# Patient Record
Sex: Female | Born: 1935
Health system: Southern US, Community
[De-identification: ages and names within clinical notes are randomized; demographics above are authoritative.]

## PROBLEM LIST (undated history)

## (undated) DIAGNOSIS — D3500 Benign neoplasm of unspecified adrenal gland: Secondary | ICD-10-CM

## (undated) DIAGNOSIS — K861 Other chronic pancreatitis: Secondary | ICD-10-CM

## (undated) DIAGNOSIS — R12 Heartburn: Secondary | ICD-10-CM

## (undated) DIAGNOSIS — I4891 Unspecified atrial fibrillation: Secondary | ICD-10-CM

## (undated) DIAGNOSIS — H3320 Serous retinal detachment, unspecified eye: Secondary | ICD-10-CM

## (undated) DIAGNOSIS — F419 Anxiety disorder, unspecified: Secondary | ICD-10-CM

## (undated) DIAGNOSIS — K922 Gastrointestinal hemorrhage, unspecified: Secondary | ICD-10-CM

## (undated) DIAGNOSIS — I10 Essential (primary) hypertension: Secondary | ICD-10-CM

## (undated) DIAGNOSIS — H548 Legal blindness, as defined in USA: Secondary | ICD-10-CM

## (undated) DIAGNOSIS — E785 Hyperlipidemia, unspecified: Secondary | ICD-10-CM

## (undated) DIAGNOSIS — Z86000 Personal history of in-situ neoplasm of breast: Secondary | ICD-10-CM

## (undated) DIAGNOSIS — K851 Biliary acute pancreatitis without necrosis or infection: Secondary | ICD-10-CM

## (undated) DIAGNOSIS — K5790 Diverticulosis of intestine, part unspecified, without perforation or abscess without bleeding: Secondary | ICD-10-CM

## (undated) DIAGNOSIS — E119 Type 2 diabetes mellitus without complications: Secondary | ICD-10-CM

## (undated) DIAGNOSIS — M8580 Other specified disorders of bone density and structure, unspecified site: Secondary | ICD-10-CM

## (undated) DIAGNOSIS — M1990 Unspecified osteoarthritis, unspecified site: Secondary | ICD-10-CM

## (undated) DIAGNOSIS — K589 Irritable bowel syndrome without diarrhea: Secondary | ICD-10-CM

## (undated) DIAGNOSIS — I2510 Atherosclerotic heart disease of native coronary artery without angina pectoris: Secondary | ICD-10-CM

## (undated) HISTORY — PX: ECTOPIC PREGNANCY SURGERY: SHX613

## (undated) HISTORY — DX: Benign neoplasm of unspecified adrenal gland: D35.00

## (undated) HISTORY — DX: Irritable bowel syndrome, unspecified: K58.9

## (undated) HISTORY — DX: Heartburn: R12

## (undated) HISTORY — DX: Essential (primary) hypertension: I10

## (undated) HISTORY — DX: Anxiety disorder, unspecified: F41.9

## (undated) HISTORY — DX: Unspecified osteoarthritis, unspecified site: M19.90

## (undated) HISTORY — DX: Diverticulosis of intestine, part unspecified, without perforation or abscess without bleeding: K57.90

## (undated) HISTORY — DX: Serous retinal detachment, unspecified eye: H33.20

## (undated) HISTORY — PX: ESOPHAGOGASTRODUODENOSCOPY: SHX1529

## (undated) HISTORY — PX: PANCREATIC PSEUDOCYST DRAINAGE: SHX2158

## (undated) HISTORY — DX: Gastrointestinal hemorrhage, unspecified: K92.2

## (undated) HISTORY — DX: Hyperlipidemia, unspecified: E78.5

## (undated) HISTORY — DX: Biliary acute pancreatitis without necrosis or infection: K85.10

## (undated) HISTORY — DX: Other chronic pancreatitis: K86.1

## (undated) HISTORY — DX: Legal blindness, as defined in USA: H54.8

## (undated) HISTORY — PX: OTHER SURGICAL HISTORY: SHX169

## (undated) HISTORY — DX: Personal history of in-situ neoplasm of breast: Z86.000

## (undated) HISTORY — DX: Other specified disorders of bone density and structure, unspecified site: M85.80

## (undated) HISTORY — PX: LAPAROSCOPIC CHOLECYSTECTOMY: SUR755

## (undated) HISTORY — DX: Atherosclerotic heart disease of native coronary artery without angina pectoris: I25.10

## (undated) HISTORY — DX: Type 2 diabetes mellitus without complications: E11.9

---
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
## 1978-03-17 HISTORY — PX: MASTECTOMY: SHX3

## 1986-03-17 DIAGNOSIS — Z86000 Personal history of in-situ neoplasm of breast: Secondary | ICD-10-CM

## 1986-03-17 HISTORY — DX: Personal history of in-situ neoplasm of breast: Z86.000

## 2000-08-04 ENCOUNTER — Encounter: Payer: Self-pay | Admitting: Family Medicine

## 2000-08-04 ENCOUNTER — Ambulatory Visit (HOSPITAL_COMMUNITY): Admission: RE | Admit: 2000-08-04 | Discharge: 2000-08-04 | Payer: Self-pay | Admitting: Family Medicine

## 2001-08-04 ENCOUNTER — Encounter: Payer: Self-pay | Admitting: Family Medicine

## 2001-08-04 ENCOUNTER — Ambulatory Visit (HOSPITAL_COMMUNITY): Admission: RE | Admit: 2001-08-04 | Discharge: 2001-08-04 | Payer: Self-pay | Admitting: Family Medicine

## 2002-02-01 ENCOUNTER — Encounter: Payer: Self-pay | Admitting: Family Medicine

## 2002-02-01 ENCOUNTER — Ambulatory Visit (HOSPITAL_COMMUNITY): Admission: RE | Admit: 2002-02-01 | Discharge: 2002-02-01 | Payer: Self-pay | Admitting: Family Medicine

## 2002-07-16 DIAGNOSIS — K851 Biliary acute pancreatitis without necrosis or infection: Secondary | ICD-10-CM

## 2002-07-16 HISTORY — PX: OTHER SURGICAL HISTORY: SHX169

## 2002-07-16 HISTORY — DX: Biliary acute pancreatitis without necrosis or infection: K85.10

## 2002-07-31 ENCOUNTER — Encounter: Payer: Self-pay | Admitting: Emergency Medicine

## 2002-07-31 ENCOUNTER — Inpatient Hospital Stay (HOSPITAL_COMMUNITY): Admission: EM | Admit: 2002-07-31 | Discharge: 2002-08-09 | Payer: Self-pay | Admitting: Emergency Medicine

## 2002-08-01 ENCOUNTER — Encounter: Payer: Self-pay | Admitting: Internal Medicine

## 2002-08-05 ENCOUNTER — Encounter: Payer: Self-pay | Admitting: General Surgery

## 2002-08-06 ENCOUNTER — Encounter (INDEPENDENT_AMBULATORY_CARE_PROVIDER_SITE_OTHER): Payer: Self-pay | Admitting: Internal Medicine

## 2002-08-14 ENCOUNTER — Inpatient Hospital Stay (HOSPITAL_COMMUNITY): Admission: EM | Admit: 2002-08-14 | Discharge: 2002-08-16 | Payer: Self-pay | Admitting: Emergency Medicine

## 2002-08-14 ENCOUNTER — Encounter: Payer: Self-pay | Admitting: Internal Medicine

## 2002-08-31 ENCOUNTER — Inpatient Hospital Stay (HOSPITAL_COMMUNITY): Admission: EM | Admit: 2002-08-31 | Discharge: 2002-09-01 | Payer: Self-pay | Admitting: Emergency Medicine

## 2002-08-31 ENCOUNTER — Encounter: Payer: Self-pay | Admitting: Emergency Medicine

## 2002-10-06 ENCOUNTER — Encounter (INDEPENDENT_AMBULATORY_CARE_PROVIDER_SITE_OTHER): Payer: Self-pay | Admitting: Internal Medicine

## 2002-10-06 ENCOUNTER — Ambulatory Visit (HOSPITAL_COMMUNITY): Admission: RE | Admit: 2002-10-06 | Discharge: 2002-10-06 | Payer: Self-pay | Admitting: Internal Medicine

## 2002-11-11 ENCOUNTER — Encounter: Payer: Self-pay | Admitting: Family Medicine

## 2002-11-11 ENCOUNTER — Ambulatory Visit (HOSPITAL_COMMUNITY): Admission: RE | Admit: 2002-11-11 | Discharge: 2002-11-11 | Payer: Self-pay | Admitting: Family Medicine

## 2002-12-08 ENCOUNTER — Encounter (INDEPENDENT_AMBULATORY_CARE_PROVIDER_SITE_OTHER): Payer: Self-pay | Admitting: Internal Medicine

## 2002-12-08 ENCOUNTER — Ambulatory Visit (HOSPITAL_COMMUNITY): Admission: RE | Admit: 2002-12-08 | Discharge: 2002-12-08 | Payer: Self-pay | Admitting: Internal Medicine

## 2003-03-22 ENCOUNTER — Ambulatory Visit (HOSPITAL_COMMUNITY): Admission: RE | Admit: 2003-03-22 | Discharge: 2003-03-22 | Payer: Self-pay | Admitting: Internal Medicine

## 2003-09-19 ENCOUNTER — Ambulatory Visit (HOSPITAL_COMMUNITY): Admission: RE | Admit: 2003-09-19 | Discharge: 2003-09-19 | Payer: Self-pay | Admitting: Internal Medicine

## 2003-11-17 ENCOUNTER — Ambulatory Visit (HOSPITAL_COMMUNITY): Admission: RE | Admit: 2003-11-17 | Discharge: 2003-11-17 | Payer: Self-pay | Admitting: Family Medicine

## 2004-02-12 ENCOUNTER — Ambulatory Visit (HOSPITAL_COMMUNITY): Admission: RE | Admit: 2004-02-12 | Discharge: 2004-02-12 | Payer: Self-pay | Admitting: Internal Medicine

## 2004-04-22 ENCOUNTER — Ambulatory Visit: Payer: Self-pay | Admitting: Internal Medicine

## 2004-08-07 ENCOUNTER — Ambulatory Visit (HOSPITAL_COMMUNITY): Admission: RE | Admit: 2004-08-07 | Discharge: 2004-08-07 | Payer: Self-pay | Admitting: Internal Medicine

## 2004-11-04 ENCOUNTER — Ambulatory Visit: Payer: Self-pay | Admitting: Internal Medicine

## 2004-11-26 ENCOUNTER — Ambulatory Visit (HOSPITAL_COMMUNITY): Admission: RE | Admit: 2004-11-26 | Discharge: 2004-11-26 | Payer: Self-pay | Admitting: General Surgery

## 2004-12-06 ENCOUNTER — Ambulatory Visit (HOSPITAL_COMMUNITY): Admission: RE | Admit: 2004-12-06 | Discharge: 2004-12-06 | Payer: Self-pay | Admitting: Urology

## 2005-02-20 ENCOUNTER — Ambulatory Visit: Payer: Self-pay | Admitting: Internal Medicine

## 2005-07-28 ENCOUNTER — Ambulatory Visit: Payer: Self-pay | Admitting: Internal Medicine

## 2005-08-04 ENCOUNTER — Ambulatory Visit (HOSPITAL_COMMUNITY): Admission: RE | Admit: 2005-08-04 | Discharge: 2005-08-04 | Payer: Self-pay | Admitting: Internal Medicine

## 2005-08-15 ENCOUNTER — Ambulatory Visit: Payer: Self-pay | Admitting: Internal Medicine

## 2005-08-15 ENCOUNTER — Ambulatory Visit (HOSPITAL_COMMUNITY): Admission: RE | Admit: 2005-08-15 | Discharge: 2005-08-15 | Payer: Self-pay | Admitting: Internal Medicine

## 2005-08-15 HISTORY — PX: COLONOSCOPY: SHX174

## 2005-11-28 ENCOUNTER — Ambulatory Visit (HOSPITAL_COMMUNITY): Admission: RE | Admit: 2005-11-28 | Discharge: 2005-11-28 | Payer: Self-pay | Admitting: General Surgery

## 2006-02-18 ENCOUNTER — Ambulatory Visit: Payer: Self-pay | Admitting: Internal Medicine

## 2006-12-01 ENCOUNTER — Ambulatory Visit (HOSPITAL_COMMUNITY): Admission: RE | Admit: 2006-12-01 | Discharge: 2006-12-01 | Payer: Self-pay | Admitting: General Surgery

## 2007-02-02 ENCOUNTER — Ambulatory Visit: Payer: Self-pay | Admitting: Internal Medicine

## 2007-08-13 ENCOUNTER — Ambulatory Visit: Payer: Self-pay | Admitting: Internal Medicine

## 2007-12-02 ENCOUNTER — Ambulatory Visit (HOSPITAL_COMMUNITY): Admission: RE | Admit: 2007-12-02 | Discharge: 2007-12-02 | Payer: Self-pay | Admitting: General Surgery

## 2008-01-26 DIAGNOSIS — C50919 Malignant neoplasm of unspecified site of unspecified female breast: Secondary | ICD-10-CM | POA: Insufficient documentation

## 2008-01-26 DIAGNOSIS — R110 Nausea: Secondary | ICD-10-CM | POA: Insufficient documentation

## 2008-01-26 DIAGNOSIS — K862 Cyst of pancreas: Secondary | ICD-10-CM | POA: Insufficient documentation

## 2008-01-26 DIAGNOSIS — K863 Pseudocyst of pancreas: Secondary | ICD-10-CM

## 2008-01-26 DIAGNOSIS — I10 Essential (primary) hypertension: Secondary | ICD-10-CM | POA: Insufficient documentation

## 2008-01-26 DIAGNOSIS — K922 Gastrointestinal hemorrhage, unspecified: Secondary | ICD-10-CM | POA: Insufficient documentation

## 2008-01-26 DIAGNOSIS — A048 Other specified bacterial intestinal infections: Secondary | ICD-10-CM | POA: Insufficient documentation

## 2008-01-26 DIAGNOSIS — B379 Candidiasis, unspecified: Secondary | ICD-10-CM | POA: Insufficient documentation

## 2008-01-26 DIAGNOSIS — K644 Residual hemorrhoidal skin tags: Secondary | ICD-10-CM | POA: Insufficient documentation

## 2008-01-26 DIAGNOSIS — K869 Disease of pancreas, unspecified: Secondary | ICD-10-CM | POA: Insufficient documentation

## 2008-01-26 DIAGNOSIS — R197 Diarrhea, unspecified: Secondary | ICD-10-CM | POA: Insufficient documentation

## 2008-01-26 DIAGNOSIS — R1110 Vomiting, unspecified: Secondary | ICD-10-CM | POA: Insufficient documentation

## 2008-01-26 DIAGNOSIS — K219 Gastro-esophageal reflux disease without esophagitis: Secondary | ICD-10-CM | POA: Insufficient documentation

## 2008-01-26 DIAGNOSIS — K5900 Constipation, unspecified: Secondary | ICD-10-CM

## 2008-01-26 DIAGNOSIS — N390 Urinary tract infection, site not specified: Secondary | ICD-10-CM | POA: Insufficient documentation

## 2008-09-12 ENCOUNTER — Ambulatory Visit: Payer: Self-pay | Admitting: Internal Medicine

## 2008-09-12 DIAGNOSIS — K861 Other chronic pancreatitis: Secondary | ICD-10-CM | POA: Insufficient documentation

## 2008-12-04 ENCOUNTER — Ambulatory Visit (HOSPITAL_COMMUNITY): Admission: RE | Admit: 2008-12-04 | Discharge: 2008-12-04 | Payer: Self-pay | Admitting: General Surgery

## 2009-09-07 ENCOUNTER — Encounter (INDEPENDENT_AMBULATORY_CARE_PROVIDER_SITE_OTHER): Payer: Self-pay | Admitting: *Deleted

## 2009-12-13 ENCOUNTER — Ambulatory Visit (HOSPITAL_COMMUNITY): Admission: RE | Admit: 2009-12-13 | Discharge: 2009-12-13 | Payer: Self-pay | Admitting: Family Medicine

## 2010-04-07 ENCOUNTER — Encounter: Payer: Self-pay | Admitting: Family Medicine

## 2010-04-18 NOTE — Letter (Signed)
Summary: Recall Office Visit  [facility]  [address]   [place], [place] [address]   Phone: [phone]  Fax: [phone]      September 07, 2009   Misty Edwards [address] [place], [place]  [address] 09-Nov-1926   Dear Ms. Warnell,   According to our records, it is time for you to schedule a follow-up office visit with [place].   At your convenience, please call [phone] to schedule an office visit. If you have any questions, concerns, or feel that this letter is in error, we would appreciate your call.   Sincerely,    Diana Eves  [facility] Ph: [phone]   Fax: [phone]

## 2010-07-30 NOTE — Letter (Signed)
Aug 13, 2007     Followup of chronic pancreatitis.  History of colonic polyps.  Positive  family history of colon cancer.  Last seen on February 02, 2007.  She  was doing very well on Creon and Nexium; that continues to be the case.  She has actually gained 10 pounds since her last visit.  She is due for  a high-risk screening surveillance colonoscopy in June 2012.  Overall,  she is feeling very well.  She is active out in the yard these days with  the warmer weather and very happy.  She is having no abdominal pain,  whatsoever.  She is tolerating Creon and Nexium very well.   CURRENT MEDICATIONS:  See updated list.   ALLERGIES:  VITAMIN D and ASA.   PHYSICAL EXAMINATION:  GENERAL:  Today, looks well.  VITAL SIGNS:  Weight 151, height 5 feet 3 inches, temperature 98.2, BP  124/70, pulse 80.  SKIN:  Warm and dry.  HEENT EXAM:  No scleral icterus.  NECK:  JVD is not prominent.  CHEST:  Lungs are clear to auscultation.  ABDOMEN:  Flat, positive bowel sounds, soft, entirely nontender to  palpation.  No appreciable masses or organomegaly.   ASSESSMENT:  History of chronic pancreatitis secondary to gallstones.  Clinically doing very well on acid suppression and concomitant  pancreatic enzyme therapy.   RECOMMENDATIONS:  Continue this regimen indefinitely.  Plan to see her  back in 1 year.  We will slate her for a followup colonoscopy in June  2012.      Jonathon Bellows, M.D.  Electronically Signed     RMR/MEDQ  D:  08/13/2007  T:  08/14/2007  Job:  [id number]   cc:   Mila Homer. Sudie Bailey, M.D.

## 2010-07-30 NOTE — Assessment & Plan Note (Signed)
Misty Edwards, Misty Edwards                CHART#:  [id number]   DATE:  02/02/2007                       DOB:  Sep 14, 1926   HISTORY OF PRESENT ILLNESS:  Originally this was Dr. Inge Rise patient.  She switched over to me in the wake of Dr. Inge Rise departure.  She was  scheduled for January 04, 2007, but had to reschedule.  This lady was  last seen in December of last year, when she had a protracted diarrheal  illness following acute onset of diarrhea back around Thanksgiving.  Her  stool studies came back negative.  It was felt she had infectious  diarrhea.  Her diarrhea has since resolved.  In fact, she has done  extremely well until November 1, when she had some upper abdominal pain  and some diarrhea and some reflux symptoms together, where she took some  Tagamet.  Otherwise, she has not had any abdominal pain, no diarrhea, no  melena or hematochezia, no odynophagia, no dysphagia, no early satiety.  She has a history of chronic calcific pancreatitis, felt to be secondary  to gallstones.  She had a couple of small pseudocysts on her last CT,  one year ago.  Dr. Dionicia Abler felt no further evaluation was warranted.  She  has been maintained on Creon and acid-suppression therapy with Nexium  since that time.  She rarely takes a Phenergan for occasional nausea.  I  note that she has lost 5.5 pounds since her office visit, February 18, 2006.  Overall, clinically she feels that she is doing well.  She had no  significant findings on colonoscopy, June 2007.  She has a positive  family history of colon cancer in her brother.  Overall, she has not had  any significant intercurrent illnesses since being seen by Dr. Dionicia Abler on  February 18, 2006.   CURRENT MEDICATIONS:  See updated list.   ALLERGIES:  No known drug allergies.   EXAM TODAY:  She looks well.  Weight 141, height 5 feet 3, temperature 98.3, BP 144/80, pulse 80.  SKIN:  Warm and dry.  There is no jaundice.  CHEST:  Lungs are clear to  auscultation.  CARDIAC EXAM:  Regular rate and rhythm without murmur, gallop or rub.  ABDOMEN:  Nondistended, positive bowel sounds, soft, entirely nontender  without appreciable mass or organomegaly.  EXTREMITY EXAM:  No edema.   ASSESSMENT:  Ms. Dascenzo has a history of chronic pancreatitis on the  basis of gallstones.  She has had a tough course with it in the past and  two residual small pseudocysts on her last CT in 2007.  She has done  extremely well, except for a self-limited episode of nonspecific  symptoms the first of this month.  She is tolerating pancreatic enzyme  therapy with concomitant acid-suppression therapy very well.  At this  point, I do not see the need to do any further evaluation on this nice  lady at this time, assuming she continues to do well.  Should she have  any recurrent abdominal pain, would consider repeat CT scanning, but I  do not feel that is necessary at this time.  I have recommended that she  continue her Creon and acid-suppression therapy.  We will plan to see  this nice lady back in the office in six months to see how  she is doing.   However, if she were to develop any interim symptoms, I have urged her  to let me know at once.       Jonathon Bellows, M.D.  Electronically Signed     RMR/MEDQ  D:  02/02/2007  T:  02/03/2007  Job:  [id number]   cc:   Mila Homer. Sudie Bailey, M.D.

## 2010-08-02 NOTE — Discharge Summary (Signed)
NAME:  Misty Edwards, Misty Edwards NO.:  [id number]   MEDICAL RECORD NO.:  [id number]                   PATIENT TYPE:  INP   LOCATION:  A218                                 FACILITY:  [facility]   PHYSICIAN:  Dalia Heading, M.D.               DATE OF BIRTH:  06-Dec-1926   DATE OF ADMISSION:  07/31/2002  DATE OF DISCHARGE:  08/09/2002                                 DISCHARGE SUMMARY   </   HOSPITAL COURSE SUMMARY:  The patient is a 77 year old white female who  presented to the emergency room with abdominal pain.  She was also noted to  have elevated liver enzyme tests as well as amylase and lipase.  Ultrasound  of the gallbladder was performed which revealed acute cholecystitis, dilated  comon bile duct and gallstone pancreatitis.  A surgery consultation was  obtained.   She was taken tot he operating room once her amylase and lipase had returned  to normal.  She underwent laparoscopic cholecystectomy with cholangiograms  on 08/05/2002.  Cholangiograms did reveal two common bile duct stones.  These  could not be flushed through the system.  Dr. Karilyn Cota, of gastroenterology,  was hurried in consultation and subsequently took the patient to the  fluoroscopy suite the next day, and the patient underwent an ERCP with  sphincterotomy for stone extraction.   Her postoperative course has been for the most part unremarkable.  Her liver  enzyme tests have been returning to normal.  Her amylase and lipase were  within normal limits postoperatively.  Preoperatively, she did have some  sinus tachycardia which was treated with Tenormin.   The patient is being discharged home on May 25 in [place] in stable condition.   DISCHARGE INSTRUCTIONS:  The patient is to follow up with Dr. Franky Macho  on 08/11/2002, Dr. Sudie Bailey in one week.   DISCHARGE MEDICATIONS:  1. Tenormin 50 mg p.o. b.i.d.  2. Darvocet-N 100 one to two tablets p.o. q.4h. p.r.n. pain.  3. Phenergan 25 mg p.o.  q.6h. p.r.n. nausea.  4. Glucotrol XL 500 mg p.o. b.i.d.  5. Avandia 4 mg p.o. daily.  6. Vasotec 10 mg p.o. b.i.d.  7. Tolbutamide dialy.   PRINCIPAL DIAGNOSES:  1. Gallstone pancreatitis.  2. Acute cholecystitis, cholelithiasis, choledocholithiasis.  3. Non-insulin-dependent diabetes mellitus.  4. Hypertension.  5. History of breast cancer.   PRINCIPAL PROCEDURES:  1. Laparoscopic cholecystectomy with cholangiograms by Dr. Franky Macho on     08/05/2002.  2. ERCP with sphincterotomy and stone extraction by Dr. Karilyn Cota on 08/06/2002.                                               Dalia Heading, M.D.    MAJ/MEDQ  D:  08/09/2002  T:  08/09/2002  Job:  [id number]   cc:   Mila Homer. Sudie Bailey, M.D.  [address] [place], [place] [address]  Fax: [phone]   Lionel December, M.D.  [address]  [place]  [place] [address]  Fax: [phone]

## 2010-08-02 NOTE — Group Therapy Note (Signed)
   NAME:  Misty Edwards, Misty Edwards NO.:  [id number]   MEDICAL RECORD NO.:  [id number]                   PATIENT TYPE:  INP   LOCATION:  IC09                                 FACILITY:  [facility]   PHYSICIAN:  Mila Homer. Sudie Bailey, M.D.           DATE OF BIRTH:  09/27/1926   DATE OF PROCEDURE:  DATE OF DISCHARGE:                                   PROGRESS NOTE   SUBJECTIVE:  The patient is recovering now in ICU after having had  laparoscopic cholecystectomy today.   OBJECTIVE:  Vital signs today show temperature 97.5, pulse 109, respiratory  rate 18, blood pressure 170/76.  She appears to be a little bit drowsy but  oriented and alert today.  She is well-developed, well-nourished.  In no  acute distress this evening.  Lungs clear throughout, moving air well.  No  intercostal retraction.  No use of accessory muscles of respiration.  The  heart has a regular rhythm, rate about 100.  No murmurs.  There is no edema  of the ankles.  Abdomen is essentially soft, with puncture marks from  laparoscopic cholecystectomy.   Glucose noted to be in the 200 range today.   Intraoperative cholangiogram showed what appeared to be multiple filling  defects of the distal common bile duct.  Apparently, one _______ was left  here.   ASSESSMENT:  1. Pancreatitis secondary to common bile duct stone.  2. Cholecystitis.  3. Obstruction of the common bile duct by stone.   PLAN:  ERCP through Dr. Karilyn Cota tomorrow.  Continue IV antibiotics and  fluids.                                               Mila Homer. Sudie Bailey, M.D.    SDK/MEDQ  D:  08/05/2002  T:  08/06/2002  Job:  [id number]

## 2010-08-02 NOTE — Op Note (Signed)
NAME:  Misty Edwards, Misty Edwards NO.:  [id number]   MEDICAL RECORD NO.:  [id number]                   PATIENT TYPE:  INP   LOCATION:  IC09                                 FACILITY:  [facility]   PHYSICIAN:  Dalia Heading, M.D.               DATE OF BIRTH:  [birth year]   DATE OF PROCEDURE:  08/05/2002  DATE OF DISCHARGE:                                 OPERATIVE REPORT   PREOPERATIVE DIAGNOSES:  1. Gallstone pancreatitis.  2. Cholecystitis.  3. Cholelithiasis.   POSTOPERATIVE DIAGNOSES:  1. Gallstone pancreatitis.  2. Cholecystitis.  3. Cholelithiasis.  4. Choledocholithiasis.   PROCEDURE:  Laparoscopic cholecystectomy with cholangiogram.   SURGEON:  Dalia Heading, M.D.   ASSISTANT:  Bernerd Limbo. Leona Carry, M.D.   ANESTHESIA:  General endotracheal.   INDICATIONS:  The patient is a 77 year old, white female, who presented with  gallstone pancreatitis and cholelithiasis.  She was also noted to have a  slightly dilated common bile duct.  The patient now comes to the operating  room for laparoscopic cholecystectomy with cholangiograms.  The risks and  benefits of the procedure, including bleeding, infection, hepatobiliary  injury, and the possibility of an open procedure were fully explained to the  patient, who gave informed consent.   PROCEDURE NOTE:  The patient was placed in the supine position.  After  induction of general endotracheal anesthesia, the abdomen was prepped and  draped using the usual sterile technique with Betadine.  Surgical site  confirmation was performed.   An supraumbilical incision was made down to the fascia.  Veress needle was  introduced into the abdominal cavity, and confirmation of placement was done  using the saline drop test.  The abdomen was then insufflated to 16 mmHg  pressure.  An 11 mm trocar was introduced into the abdominal cavity under  direct visualization without difficulty.  The patient was placed in reverse  Trendelenburg position.  In addition, an 11 mm trocar was placed in the  epigastric region, and 5 mm trocars were placed in the right upper quadrant  and right flank regions.  The liver was inspected and noted to be within  normal limits.  The gallbladder was retracted superiorly and laterally.  The  dissection was begun around the infundibulum of the gallbladder.  The cystic  duct was first identified.  Its junction to the infundibulum fully  identified.  A single Endo Clip was placed proximally on the cystic duct.  An incision was made in the cystic duct, and the cholangiocatheter was  inserted.  Under fluoroscopic guidance, cholangiograms were performed.  Filling defects were note in the distal common bile duct.  Despite multiple  attempts at flushing the common duct, these could not be flushed through.  The dye did not flow into the duodenum.  The patient also had thick,  granular sludge present under pressure when the cystic duct was opened.  It  was thought the patient would be best treated with a postoperative ERCP and  sphincterotomy.  The cholangiocatheter was removed, and multiple Endo Clips  were placed distally on the cystic duct, and the cystic duct was divided.  The cystic artery was likewise ligated and divided.  The gallbladder was  freed away from the gallbladder fossa using Bovie electrocautery.  The  gallbladder was delivered through the epigastric trocar site using the  EndoCatch bag.  The gallbladder fossa was inspected, and no abnormal  bleeding or bile leakage was noted.  Surgicel was placed in the gallbladder  fossa.  All fluid and air were then evacuated from the abdominal cavity  prior to removal of the trocars.   All wounds were irrigated with normal saline.  All wounds were injected with  0.5 Sensorcaine.  The supraumbilical fascia as well as epigastric fascia  were reapproximated using an 0 Vicryl interrupted suture.  All skin  incisions were closed using  staples.  Betadine, ointment, and dry sterile  dressings were applied.   All tape and needle counts correct at the end of the procedure.  The patient  was extubated in the operating room and went to the recovery room in guarded  but stable condition.  She did have some respiratory depression  postoperatively, and the patient was going to be transferred to the  intensive care unit for further monitoring.  Complications:  None. Specimen:  Gallbladder with stones.  Blood loss:  Less than 100 mL.                                               Dalia Heading, M.D.    MAJ/MEDQ  D:  08/05/2002  T:  08/05/2002  Job:  [id number]   cc:   Mila Homer. Sudie Bailey, M.D.  [address] [place], [place] [address]  Fax: [phone]   Lionel December, M.D.  [address]  [place]  [place] [address]  Fax: [phone]

## 2010-08-02 NOTE — Consult Note (Signed)
NAME:  Misty Edwards, Misty Edwards NO.:  [id number]   MEDICAL RECORD NO.:  [id number]                   PATIENT TYPE:  INP   LOCATION:  A209                                 FACILITY:  [facility]   PHYSICIAN:  Lionel December, M.D.                 DATE OF BIRTH:  Jun 28, 1926   DATE OF CONSULTATION:  08/15/2002  DATE OF DISCHARGE:                                   CONSULTATION   REFERRING PHYSICIAN:  Gracelyn Nurse, M.D.   REASON FOR CONSULTATION:  GI bleed and pancreatic phlegmon.   HISTORY OF PRESENT ILLNESS:  The patient is a 77 year old, Caucasian female  who was admitted under Dr. Stasia Cavalier service yesterday via emergency room  where she presented with elevated blood glucose levels, profound weakness  and inability to eat.  On admission, she was noted to have acidosis possibly  to combination of DKA and Glucophage and this has improved with therapy.  The patient was admitted to this facility on Jul 31, 2002, for biliary  pancreatitis.  She recovered rather slowly.  She had elevated bilirubin and  transaminases, but these gradually came down.  She had laparoscopic  cholecystectomy on Aug 05, 2002.  She did have intraoperative cholangiogram  and she was noted to have small stones in bile duct which could not be  flushed out.  Therefore, ERCP on Aug 06, 2002, was performed.  Pancreatic  duct was not filled with contrast.  She had sphincterotomy with removal of  two small stones and a lot of black blood.  Post procedure, she did fine.  She did have not have any bump in her amylase or lipase.  Per discharge, her  amylase and lipase were normal and WBC was also normal.  She really did not  have any pain other than just some soreness which was residual from her  pancreatitis.  She noted her sugars were high.  One day her blood glucose  level was over 400 and she could not eat and felt very weak.  She was asked  to increase one of her oral hypoglycemics.  She started to  have some pain  about two to three days ago.  Yesterday, she had diarrhea with dark stool,  but she did not realize the significance.  In the hospital, she was noted to  have tarry heme positive stool.  She has not experienced any nausea,  vomiting or hematemesis.  She also has not experienced dyspnea, chest pain,  fever or chills.  She has lost some weight, but she is not sure how much.   MEDICATIONS PRIOR TO ADMISSION:  1. Glucophage XR 1 g b.i.d.  2. Avandia 4 mg q.d.  3. Vasotec 10 mg q.d.  4. Atenolol 50 mg q.d.  5. Darvocet-N 100 q.4h. p.r.n.  6. Phenergan 25 mg q.4h. p.r.n.   MEDICATIONS ON ADMISSION:  1. Humulin R via sliding scale.  2.  NPH insulin 12 units q.12h.  3. Zosyn 3.375 g IV q.6h.  4. Protonix 40 mg IV q.24h.  5. Morphine sulfate 1-2 mg IV q.4-6h. p.r.n.   LABORATORY DATA AND X-RAY FINDINGS:  WBC 11.5, H&H 11.7 and 34.7, platelet  count 411,000; 89 neutrophils, 0 bands.  Sodium 134, potassium 4.6, chloride  105, CO2 20, glucose 365, BUN 14, creatinine 1.4, calcium 9.1.  Her total  bilirubin is 2 with direct of 0.2, Alk phos 277, AST 27, ALT 55, total  protein 6.3 with albumin of 2.5.  Her transaminases have come down since  discharge and AP has gone up slightly.  Her PT from this afternoon was 14.6  with INR of 1.1.  Her last H&H dropped to 9.5 and 27.9.   She had abdominopelvic CT yesterday which showed 7 x 14 cm phlegmon anterior  perirenal space diffusely encasing pancreatic tissue.  Pancreas is  attenuated by enhancing in the alternate process to tail in the central  part.  There was some thickening to sigmoid colon.   PAST MEDICAL HISTORY:  1. Diabetic since 1988.  This was initially picked up in 1975, but then     glucose levels remained normal for awhile.  2. Hypertension.  3. Right mastectomy about 13 years ago for breast CA and has remained in     remission.  4. She had benign lesion removed from left breast.  5. She has had tacking up of  bladder.  6. Surgery for tubal pregnancy.  7. Colonoscopy about three years ago with removal of small polyps and     appears that she has been treated for H. pylori infection.  8. Recent laparoscopic cholecystectomy and ERCP as above.   ALLERGIES:  No known drug allergies.   FAMILY HISTORY:  Significant for colon cancer in her father who died of it.  Brother has had surgery for colon cancer.  He is presently at [facility] in [place] for complicated biliary pancreatitis.  He  was at this hospital and transferred.  She has six siblings living and four  our diabetic.  Her mother died of ovarian cancer.  She had another brother  who just had laparoscopic cholecystectomy at [facility] last week.   SOCIAL HISTORY:  She is married and has five children who are in good  health.  She is retired from [employer].  She has never smoked cigarettes  and does not drink alcohol.   PHYSICAL EXAMINATION:  GENERAL:  Well-developed, well-nourished, Caucasian  female who does not appear to be in any distress.  VITAL SIGNS:  Admission weight 154.3 pounds, pulse 67 per minute, blood  pressure 153/78, respirations 20, temperature 97.4.  HEENT:  Conjunctivae somewhat pale, sclerae nonicteric.  Oropharyngeal  mucosa is normal.  She has few teeth in lower jaw in poor condition.  She  has edentulous left upper jaw.  NECK:  Without masses or thyromegaly.  CARDIAC:  Regular rate with normal S1, S2.  No murmur or gallop noted.  LUNGS:  She has few rales at left base.  ABDOMEN:  Symmetrical.  Laparoscopy scars are well-healed.  Bowel sounds  normal.  She has mild tenderness in mid epigastrium.  No organomegaly or  masses.  RECTAL:  Deferred.  Stool earlier today has been heme positive.  EXTREMITIES:  She has trace pitting edema around her ankles.   I have reviewed her CT and findings are as above.  ASSESSMENT:  The patient is a 77 year old,  Caucasian female who was  treated  for biliary pancreatitis recently.  She had laparoscopic cholecystectomy on  May 21, followed by endoscopic retrograde cholangiopancreatography with  sphincterotomy on May 22, and she went home on May 25.  Predischarge  amylase, lipase and white blood count were normal.  She had fairly  protracted course of her pancreatitis.  Now she presents with mild acidosis  secondary to her Glucophage diabetic ketoacidosis which has improved with  intravenous fluids and insulin with stopping of Glucophage.  She also has  developed phlegmon in the pancreas or more appropriate necrotic pancreas.  She does not appear to be toxic.  Central portion of the pancreas enhances  which means it is viable.  Although she does not have any obvious  pseudocyst, she is at great risk to develop, and I am almost certain that  she will develop pseudocyst, given the degree of necrosis.  In this setting,  it is very appropriate to treat her with intravenous Zosyn.   She has melena.  I suspect she has developed stress-induced peptic ulcer  disease.  Since her hemoglobin and hematocrit has dropped significantly,  esophagogastroduodenoscopy would be appropriate in this setting.   RECOMMENDATIONS:  1. EGD in a.m.  2. Zosyn should be continued for now.  3. Will add pancreatic enzyme supplements in the a.m.  4. She will need repeat CT in the next four to six days to determine whether     or not she has developed pseudocyst.   I would like to thank you for the opportunity to participate in the care of  this nice lady.                                               Lionel December, M.D.    NR/MEDQ  D:  08/15/2002  T:  08/15/2002  Job:  [id number]

## 2010-08-02 NOTE — Op Note (Signed)
Misty Edwards, Misty Edwards               ACCOUNT NO.:  [id number]   MEDICAL RECORD NO.:  [id number]          PATIENT TYPE:  AMB   LOCATION:  DAY                           FACILITY:  [facility]   PHYSICIAN:  Lionel December, M.D.    DATE OF BIRTH:  [birth year]   DATE OF PROCEDURE:  08/15/2005  DATE OF DISCHARGE:                                 OPERATIVE REPORT   PROCEDURE:  Colonoscopy.   INDICATION:  Misty Edwards is a 77 year old Caucasian female who is undergoing  high-risk screening colonoscopy.  Family history is positive for colon  carcinoma in a brother.  Her last exam was over 6 years ago.  Procedure and  risks were reviewed with the patient and 40 mg IV Versed 8 mg IV.   FINDINGS:  Procedure performed in endoscopy suite.  The patient's vital  signs and O2 sat were monitored during the procedure and remained stable.  The patient was placed left lateral position.  Rectal examination performed.  No abnormality noted external or digital exam.  Olympus videoscope was  placed rectum and advanced under vision into sigmoid.  The patient was  turned into supine position and scope was passed into descending colon and  once this was reduced.  Scope was advanced cecum which was identified by  appendiceal orifice and ileocecal valve.  Pictures taken for the record.  Preparation was satisfactory.  Mucosa was examined for the second time.  They are few tiny diverticula at sigmoid colon.  Rectal mucosa was normal.  Scope was retroflexed to examine anorectal junction and small hemorrhoids  noted below the dentate line.  Endoscope was straightened and withdrawn.  The patient tolerated the procedure well.   FINAL DIAGNOSIS:  Few tiny diverticula at sigmoid colon, external  hemorrhoids but no evidence of the polyps or other abnormalities.   RECOMMENDATIONS:  She will resume her usual medicines and diet.  Yearly  Hemoccults.  Next, she may consider next screening exam in 5 years from now.      Lionel December,  M.D.  Electronically Signed     NR/MEDQ  D:  08/15/2005  T:  08/15/2005  Job:  [id number]   cc:   Mila Homer. Sudie Bailey, M.D.  Fax: [phone]

## 2010-08-02 NOTE — Discharge Summary (Signed)
NAME:  Misty Edwards, Misty Edwards NO.:  [id number]   MEDICAL RECORD NO.:  [id number]                   PATIENT TYPE:  INP   LOCATION:  A209                                 FACILITY:  [facility]   PHYSICIAN:  Hanley Hays. Dechurch, M.D.           DATE OF BIRTH:  15-Nov-1926   DATE OF ADMISSION:  08/14/2002  DATE OF DISCHARGE:  08/16/2002                                 DISCHARGE SUMMARY   DIAGNOSES:  1. Pancreatic phlegmon.  2. Status post cholecystectomy secondary to gallstone pancreatitis Aug 05, 2002.  3. Diabetes mellitus.  4. Metabolic acidosis nonanion gap resolved.  5. History of breast cancer status post right mastectomy 13 years prior.  6. History of hypertension.  7. History of bladder suspension surgery.   DISPOSITION:  Patient is being transferred to [facility] at the family's request.   HOSPITAL COURSE:  Patient is a 77 year old Caucasian female who underwent  laparoscopic cholecystectomy Aug 05, 2002 secondary to gallstone  pancreatitis which was diagnosed Jul 31, 2002.  At the time of discharge,  her transaminase and pancreatic enzymes, bilirubin all were normal.  White  count had improved.  On Aug 06, 2002 she underwent ERCP as an intraoperative  cholangiogram revealed several small stones.  The pancreatic duct was not  filled with contrast.  She had a sphincterotomy with removal of two small  stones and a lot of old blood according to the notes.  She did well post  procedure.  She had no evidence of changes in her enzymes.  Her p.o. intake  at the time of discharge was poor but she was tolerating the diet.  She had  no nausea.  She had no pain.  She was discharged to home where she failed to  thrive.  She was noted to have hyperglycemia and was seen by her primary  care physician apparently her medicines were increased but due to lack of  improvement she presented to the emergency room, eventually admitted  to the  hospital for uncontrolled diabetes, severe metabolic acidosis and further  evaluation.  At the time of admission she also developed some tarry stools  which was grossly heme positive.  She remained hemodynamically stable.  She  underwent endoscopy which revealed some gastritis but no frank lesions.  Her  metabolic acidosis corrected with fluids and the discontinuation of  Glucophage.  CT of the abdomen was done because of her pain and presentation  which revealed prominent peripancreatic phlegmon stenting from the uncinate  process to the pancreatic tail measuring about 14 x 7.  There was no frank  abscess or frank cyst formation but there was an area suggesting developing  organization of a pseudocyst.  There was no vessel thrombosis and nothing to  suggest necrotizing pancreatitis.   DISPOSITION:  The findings were discussed at length with the family.  The  patient  actually was feeling better on the second hospital day.  The plan  was to begin TPN and re-CT scan the patient later in the week and if  necessary transfer to [facility] for pseudocyst  drainage if necessary however, the family insisted upon transfer at this  time.  Dr. Dionicia Abler was kind enough to assist in arranging this through the GI  department.  Dr. Cala Bradford graciously accepted the patient in transfer and  she is awaiting placement at that facility.   DISCHARGE PHYSICAL EXAMINATION:  GENERAL:  At the time of discharge the  patient is alert and appropriate in no distress.  VITAL SIGNS: Temperature is 97.6, blood pressure is 152/62.  Glucose is  ranging anywhere between 190-220.  Pulse 80, respirations unlabored.  LUNGS:  Clear.  No distress.  ABDOMEN:  Soft and nontender, mild diffuse tenderness.  No mass.  No  rebound.  Positive bowel sounds.  EXTREMITIES:  No edema is present.  NEUROLOGIC:  Basically intact and baseline.  SKIN:  There is no skin rash, lesion or breakdown.   LABORATORY  DATA:  White count 6, hemoglobin 9.9, hematocrit 29.2, platelets  242, normal differential.  INR 1.1.  BUN 8, creatinine 0.8.  Calcium 8.9.  Albumin 2.1.  SGPT is 47.  SGOT normal.  Bilirubin is 0.8.                                                Hanley Hays Josefine Class, M.D.    FED/MEDQ  D:  08/16/2002  T:  08/16/2002  Job:  [id number]

## 2010-08-02 NOTE — H&P (Signed)
NAME:  Misty Edwards, Misty Edwards NO.:  [id number]   MEDICAL RECORD NO.:  [id number]                   PATIENT TYPE:  EMS   LOCATION:  ED                                   FACILITY:  [facility]   PHYSICIAN:  Gracelyn Nurse, M.D.              DATE OF BIRTH:  Feb 13, 1927   DATE OF ADMISSION:  07/31/2002  DATE OF DISCHARGE:                                HISTORY & PHYSICAL   CHIEF COMPLAINT:  Abdominal pain.   HISTORY OF PRESENT ILLNESS:  This is a 77 year old white female who presents  with acute onset of abdominal pain this morning.  It was followed shortly  thereafter by nausea and vomiting.  She has vomited several times.  The pain  has gotten worse.  It is most intense above the umbilical area.  She has no  history of alcohol use.  No history of gallstones.   PAST MEDICAL HISTORY:  1. Non-insulin dependent diabetes.  2. Hypertension.  3. History of breast cancer.     a. Status post right mastectomy.  4. Status post bladder tack.   ALLERGIES:  No known drug allergies.   MEDICATIONS:  1. Glucotrol XL 500 mg b.i.d.  2. Avandia 4 mg daily.  3. Vasotec 10 mg b.i.d.  4. Tolbutamide daily.   SOCIAL HISTORY:  She stopped smoking years ago.  Drinks no alcohol.  Is  married, has five children.   FAMILY HISTORY:  Mother had ovarian cancer, and father had colon cancer.   REVIEW OF SYMPTOMS:  As per HPI.  Remainder of systems negative.   PHYSICAL EXAMINATION:  VITAL SIGNS:  Temperature 97.2, pulse 88,  respirations 16, blood pressure 185/77.  GENERAL:  This is well-developed, well-nourished white female in obvious  pain.  HEENT:  Pupils equal, round, reactive to light.  Extraocular movements were  intact.  Oral mucosa moist.  Oropharynx is clear.  CARDIOVASCULAR:  Regular rate and rhythm with no murmurs.  LUNGS:  Clear to auscultation.  ABDOMEN:  Obese, nondistended, bowel sounds are positive.  There is pain in  the epigastric area.  There is no right upper  quadrant pain.  EXTREMITIES:  Lower extremity edema 1+.  NEUROLOGIC:  Cranial nerves II-XII intact.  No focal deficits.  SKIN:  Moist with no rash.   ADMISSION LABORATORY DATA:  Lipase is 1500, amylase 590.  White blood cell  count 11.2, hemoglobin 13.4, platelets 155.  Sodium 136, potassium 4.2,  chloride 100, CO2 25, BUN 19, creatinine 0.9, glucose 329.  Urinalysis shows  nitrite positive and 7 to 10 white blood cells per high powered field.   ASSESSMENT AND PLAN:  1. Acute pancreatitis.  We will go ahead and make her n.p.o., give her pain     medications, antiemetics, and IV fluids.  She is not an alcohol abuser,     so doubt this is the etiology.  Her liver function tests  are normal,     however, we will get an ultrasound to rule out any gallstones.  2. Urinary tract infection.  We will start him on some antibiotics and     culture her urine.  3. Non-insulin dependent diabetes.  We will hold her oral medications while     she is n.p.o.  We will use sliding scale.  4. Hypertension.  We will continue her current medications.                                               Gracelyn Nurse, M.D.    JDJ/MEDQ  D:  07/31/2002  T:  08/01/2002  Job:  [id number]

## 2010-08-02 NOTE — H&P (Signed)
NAME:  Misty Edwards, EICK NO.:  [id number]   MEDICAL RECORD NO.:  [id number]                   PATIENT TYPE:  EMS   LOCATION:  ED                                   FACILITY:  [facility]   PHYSICIAN:  Gracelyn Nurse, M.D.              DATE OF BIRTH:  09-28-1926   DATE OF ADMISSION:  08/31/2002  DATE OF DISCHARGE:                                HISTORY & PHYSICAL   CHIEF COMPLAINT:  Abdominal pain and constipation.   HISTORY OF PRESENT ILLNESS:  This is a 77 year old white female who has a  history of having gallstone pancreatitis.  She then had a cholecystectomy  and later developed a pancreatic pseudocyst which has been recently drained  over at [facility].  She still has the percutaneous drain in place and  also has a nasogastric feeding tube in place.  She was slowly recovering at  home when about three days ago she developed abdominal pain, bloating, both  fecal and urinary retention.  She was prescribed an enema as an outpatient.  Her daughter says she had very little result, and she comes in today still  complaining of the abdominal pain.  The pain is mostly suprapubic.  She had  a Foley catheter placed in the emergency room which returned several hundred  mL's of urine, and her pain subsided greatly after that.  She has been  taking narcotic pain medications, and she has been fairly sedentary in her  recuperation.   PAST MEDICAL HISTORY:  1. History of gallstone pancreatitis.     A. Status post cholecystectomy.     B. Status post percutaneous drain of pseudocyst.  2. Insulin-dependent diabetes.  3. Hypertension.  4. History of breast cancer.     A. Status post right mastectomy.  5. Status post bladder tack.   ALLERGIES:  No known drug allergies.   CURRENT MEDICATIONS:  1. Ativan 1 mg b.i.d. p.r.n.  2. Oxycodone 5 mg q.4h. p.r.n.  3. Atenolol 50 mg daily.  4. Vasotec 10 mg daily.  5. Insulin NPH 20 units q.a.m. and 15 units q.p.m.   SOCIAL HISTORY:  She stop smoking several years ago.  She does not drink  alcohol.  She is married and has five children.   FAMILY HISTORY:  Her mother had ovarian cancer, and her father had colon  cancer.   REVIEW OF SYSTEMS:  As per HPI.  She has been very sedentary since her  surgery and complications.  She has a poor appetite and is unable to  ambulate very much.  Remainder of systems negative.   PHYSICAL EXAMINATION:  VITAL SIGNS:  Temperature is 97.5, pulse 73,  respirations 24, blood pressure 151/54.  GENERAL:  This is a poorly nourished white female in no acute distress.  HEENT:  Pupils are equal, round, and reactive to light.  Extraocular  movements intact.  Oral mucosa is moist.  Oropharynx is clear.  There is a  nasogastric tube in the left nostril.  CARDIOVASCULAR:  Regular rate and rhythm.  With no murmurs.  LUNGS:  Clear to auscultation.  ABDOMEN:  Soft.  There is some mild diffuse tenderness.  Bowel sounds are  positive.  She does have the percutaneous drain in place in the  midepigastric area.  EXTREMITIES:  No edema.  NEUROLOGIC:  Cranial nerves II-XII grossly intact.  No focal deficits.  SKIN:  Moist.  With no rash.   ADMITTING LABORATORIES:  White blood cell count 3.8, hemoglobin 10.9.  Glucose is 186, sodium 132, potassium 4.8, chloride 94, CO2 31, BUN 14,  creatinine 0.6, glucose is 354.   ASSESSMENT AND PLAN:  1. Constipation with fecal impaction.  Will admit for disimpaction.  Will     also need to get her on laxatives while she is taking her pain     medications on a daily basis.  2. Urinary retention.  This may be caused some by the pain medications.     Also, she could have some bladder neck compression from the fecal     impaction.  This was relieved with a Foley catheter.  Will keep that in     place for now.  After she is disimpacted we will pull the Foley and see     if she can urinate on her own.  3. Pancreatic pseudocyst.  She does have the  drainage tubes in place.     Currently, there is nothing draining from them.  She was scheduled to     have these pulled out by Dr. Karilyn Cota in his office tomorrow.  Will go     ahead and consult him here in the hospital for removal.  With no other     symptoms of abdominal pain or nausea and vomiting, probably do not need     to re-CT her but may want to do that in the future to reassess the     pseudocyst.  4. Diabetes:  She was a type 2.  However, since her pancreatitis she has     been insulin-dependent.  Will continue her NPH Insulin and will add     sliding scale for better control while she is here in the hospital.                                               Gracelyn Nurse, M.D.    JDJ/MEDQ  D:  08/31/2002  T:  08/31/2002  Job:  [id number]

## 2010-08-02 NOTE — H&P (Signed)
NAME:  Misty Edwards, Misty Edwards               ACCOUNT NO.:  [id number]   MEDICAL RECORD NO.:  [id number]          PATIENT TYPE:  AMB   LOCATION:  DAY                           FACILITY:  [facility]   PHYSICIAN:  Lionel December, M.D.    DATE OF BIRTH:  12-02-1926   DATE OF ADMISSION:  DATE OF DISCHARGE:  LH                                HISTORY & PHYSICAL   PRESENTING COMPLAINT:  Follow for chronic pancreatitis/pseudocyst.   HISTORY OF PRESENT ILLNESS:  Misty Edwards is 77 year old Caucasian female patient  of Dr. Sudie Bailey who is here for scheduled visit.  She was last seen in  [place].  She has history of biliary pancreatitis complicated by two  pseudocysts.  She initially presented in [id number].  She had severe  pancreatitis.  She eventually required CT-guided drainage of the fluid.  Prior to that she had had laparoscopic cholecystectomy with cholangiogram in  [id number] and she had ERCP with sphincterotomy with removal of stones  following gallbladder surgery.  In June she presented with upper GI bleed  secondary to gastritis.  Complicated by pseudocyst whose last CT was in  May2006 revealing 54 x 33 mm pseudocyst in tail.  Other cyst had completely  resolved.  She has been maintained on a PPI and pancreatic enzyme  supplement.   She states she is doing quite well.  She had some nausea yesterday which is  rather rare for.  She did not have any vomiting, fever, chills, or diarrhea.  She has a good appetite.  She denies melena or rectal bleeding.  Her bowels  usually move every day.  She may have heartburn once or twice a week  depending on the type of food she eats.  She is maintaining her weights.  She has family history of colon carcinoma as discussed below and is overdue  for her high-risk colonoscopy.   She is on NPH insulin 30 units in the a.m. and 12 units in p.m., Creon 20  two with each meal and one with snack, MVI daily, ASA 81 mg every weekly,  Glucophage 500 mg daily, tolbutamide 500 mg  q.i.d., Nexium 40 mg q.a.m.,  Levsin SL t.i.d. p.r.n., Phenergan 25 mg b.i.d. p.r.n., HCTZ 25 mg daily,  Darvocet-N 100 b.i.d. p.r.n., lovastatin 20 mg daily, Valium 5 mg q.h.s.,  Macrodantin 50 mg daily, Evista 60 mg daily, Ambien 5 mg q.h.s. p.r.n.   PAST MEDICAL HISTORY:  She has been diabetic for over 30 years.  She has  hypertension, history of breast carcinoma with right mastectomy about 16 or  17 years ago.  She has had surgery for tubal pregnancy in 1950s.  She had  tacking up of her bladder.  Her last high-risk screening colonoscopy was in  2001.  She presented with biliary pancreatitis in [id number].  She had  laparoscopic cholecystectomy followed by ERCP with sphincterotomy.  She had  a large pseudocyst which was drained percutaneously in June2004.  She also  had upper GI bleed in September2004 secondary to gastritis.  Patient was  also evaluated at [facility] and was fed via nasal jejunal tube for several  weeks.  She was also seen at the [facility] for her complicated pancreatitis and  was fed via nasal jejunal tube for a while.  Her last CT was in May2006 and  she still has pseudocyst in tail of pancreas measuring 54 x 33 mm and she  has stable bilateral adrenal adenomas.   ALLERGIES:  NK.   FAMILY HISTORY:  Father died of colon carcinoma in his 63s.  One brother had  surgery for colon carcinoma in his 56s, is presently doing well.  He also  had complicated pancreatitis.  She lost one brother two days ago of MI at  age 2.  She has four brothers living, another brother died of accident at  age 81.   She is married.  She is retired.  She has never smoked cigarettes and does  not drink alcohol.  She has five children in good health.   OBJECTIVE:  VITAL SIGNS:  Weight 145 pounds.  She is 5 feet 3 inches tall.  Pulse 82 per minute, blood pressure 148/60 temperature is 97.5.  HEENT:  Conjunctivae is pink.  Sclerae is nonicteric.  Oropharynx mucosa is  normal.  She is edentulous  _________ she has eight or 10 of her teeth in  lower jaw in fair condition.  No neck masses or thyromegaly noted.  CARDIAC:  Regular rhythm.  Normal S1 and S2.  No murmur or gallop noted.  LUNGS:  Clear to auscultation.  ABDOMEN:  Symmetrical.  Bowel sounds are normal.  On palpation is soft,  nontender without organomegaly or masses.  RECTAL:  Deferred.  EXTREMITIES:  No peripheral edema or clubbing noted.   ASSESSMENT:  Chronic pancreatitis with small pseudocysts.  One of the two  cysts have complete resolved and hopefully this cyst is heading in that  direction too.  Last CT was one year ago and this needs to be repeated.   She has been symptom-free with pancreatic enzyme supplement and a PPI.   Positive family history of colon carcinoma in father and a sibling.  Her  last colonoscopy was over six years ago and she needs to undergo one now.   RECOMMENDATIONS:  1.  Abdominal CT with attention to pancreas.  2.  High-risk screening colonoscopy to be scheduled in two weeks from now.      Lionel December, M.D.  Electronically Signed     NR/MEDQ  D:  07/28/2005  T:  07/28/2005  Job:  [id number]   cc:   Mila Homer. Sudie Bailey, M.D.  Fax: [phone]

## 2010-08-02 NOTE — Op Note (Signed)
NAME:  Misty Edwards, Misty Edwards                         ACCOUNT NO.:  [id number]   MEDICAL RECORD NO.:  [id number]                   PATIENT TYPE:  INP   LOCATION:  A218                                 FACILITY:  [facility]   PHYSICIAN:  Lionel December, M.D.                 DATE OF BIRTH:  [birth year]   DATE OF PROCEDURE:  08/06/2002  DATE OF DISCHARGE:                                 OPERATIVE REPORT   PROCEDURE:  Endoscopic retrograde cholangiopancreatography with  sphincterotomy and stone extraction.   ENDOSCOPIST:  Lionel December, M.D.   INDICATIONS FOR PROCEDURE:  Ms. Mooneyhan is a 77 year old Caucasian female who  presented with biliary pancreatitis. She had elevated LFTs on admission, but  these improved.  She had laparoscopic cholecystectomy with intraoperative  cholangiogram and documented to have a small stone in the bile duct which  could not be flushed out.  She is, therefore, undergoing this procedure.  The procedure and risks were reviewed with the patient and informed consent  was obtained.   PREOPERATIVE MEDICATIONS:  Cetacaine spray for pharyngeal topical  anesthesia.  Demerol 50 mg IV and Versed 5.5 mg IV, Glucagon 0.5 mg IV   FINDINGS:  Procedure performed in radiology department.  Dr. Tyron Russell assisted  in the help with fluoroscopy.  The patient was placed in semiprone position.  Vital signs and O2 saturations were monitored during the procedure and  remained stable.  Therapeutic Olympus video duodenoscope was passed via the  oropharynx into the esophagus and stomach, and across the pylorus into the  bulb and descending duodenum.  Ampulla was identified.  Ampullary orifice  was very small.  CBD was selectively cannulated with autotome and guide  wire.  The biliary system was filled.  The duct was mildly dilated.  There  were a few small filling defects.  A sphincterotomy was performed.  As there  was a gush of bile and contrast, 2 small stones came out along with a lot of  black  debris or grit.  An 8.5 mm balloon was trolled through the duct and  was cleared.  The endoscope was withdrawn.  The patient tolerated the  procedure well.   FINAL DIAGNOSIS:  Choledocholithiasis.  Sphincterotomy performed with  removal of 2 stones and black grit or microlithiasis.    RECOMMENDATIONS:  1. Clear liquids today and will advance her diet in the a.m.  She will have     LFTs and serum amylase in the a.m.  2. If she does well hopefully she will go home in 2 days.                                               Lionel December, M.D.    NR/MEDQ  D:  08/06/2002  T:  08/06/2002  Job:  [id number]   cc:   Angus G. Renard Matter, M.D.  [address]  [place]  [place] [address]  Fax: [phone]   Dalia Heading, M.D.  [address] [place]  [place] [address]  Fax: [phone]

## 2010-08-02 NOTE — Discharge Summary (Signed)
NAME:  Misty Edwards, Misty Edwards NO.:  [id number]   MEDICAL RECORD NO.:  [id number]                   PATIENT TYPE:  INP   LOCATION:  A313                                 FACILITY:  [facility]   PHYSICIAN:  Gracelyn Nurse, M.D.              DATE OF BIRTH:  1926-04-06   DATE OF ADMISSION:  08/31/2002  DATE OF DISCHARGE:  09/01/2002                                 DISCHARGE SUMMARY   DISCHARGE DIAGNOSES:  1. Constipation with fecal impaction.  2. Urinary retention.  3. Yeast infection.  4. Sacral decubitus, stage I.  5. Deconditioning.  6. Diabetes.  7. History of gallstone pancreatitis.     a. Status post cholecystectomy.     b. Status post percutaneous drain with pancreatic pseudocyst.  8. Hypertension.  9. History of breast cancer.     a. Status post right mastectomy.  10.      Status post bladder tack.   DISCHARGE MEDICATIONS:  1. Colace 100 mg daily.  2. Mycolog-II cream apply to affected area b.i.d.  3. Ativan 1 mg b.i.d. p.r.n.  4. Oxycodone 5 mg q.4h. p.r.n.  5. Atenolol 50 mg daily.  6. Vasotec 10 mg daily.  7. Insulin NPH 20 units q.a.m. and 15 units q.p.m.   REASON FOR ADMISSION:  This is a 77 year old white female with a history of  gallstone pancreatitis who is status post cholecystectomy and later  developed pancreatic pseudocyst.  She has recently had percutaneous drainage  of this cyst.  On admission, the drainage tubes are still in place.  She is  also receiving nasogastric feeding.  She began complaining of abdominal pain  and bloating with both fecal and urinary retention.  She was given enemas as  an outpatient with very little result.   HOSPITAL COURSE:  Problem 1:  CONSTIPATION WITH FECAL IMPACTION:  The  patient was disimpacted, given enemas, and also started on laxatives.  She  responded well.  She had three large stools in the hospital and felt much  Misty Edwards.  She is going to be placed on Colace on a daily basis while she is  on  her pain medications.  She currently takes oxycodone.  Advised her if she  becomes constipated that she can take up to two more doses of Colace and use  enemas as needed.   Problem 2:  URINARY RETENTION:  Believe this was secondary to bladder  compression from her being so distended from the constipation.  She had a  Foley catheter placed which relieved this.  After she was disimpacted, Foley  catheter was pulled and she was able to void on her own.   Problem 3:  YEAST INFECTION:  She was prescribed some Mycolog cream.   Problem 4:  SACRAL DECUBITUS:  She has a stage I type ulcer.  She is advised  to change positions frequently and she can apply the Mycolog cream  to this  also.   Problem 5:  DIABETES:  She was continued on her normal dose of insulin.   Problem 6:  PANCREATIC PSEUDOCYST STATUS POST DRAINAGE:  The drainage tubes  were removed during this admission by Dr. Karilyn Cota.  He wants her to remain on  the nasogastric feeding for a couple more weeks and he will see her as an  outpatient for removal of that.   Problem 7:  DECONDITIONING:  We are going to have home physical therapy see  her and evaluate her and treat her as they see fit.   DISPOSITION:  The patient is discharged in stable condition.  She is to  follow up with Dr. Karilyn Cota in two weeks.                                               Gracelyn Nurse, M.D.    JDJ/MEDQ  D:  09/01/2002  T:  09/02/2002  Job:  [id number]   cc:   Mila Homer. Sudie Bailey, M.D.  [address] [place], [place] [address]  Fax: [phone]   Lionel December, M.D.  [address]  [place]  [place] [address]  Fax: [phone]

## 2010-08-02 NOTE — Group Therapy Note (Signed)
   NAME:  Misty Edwards, Misty Edwards NO.:  [id number]   MEDICAL RECORD NO.:  [id number]                   PATIENT TYPE:  INP   LOCATION:  A219                                 FACILITY:  [facility]   PHYSICIAN:  Mila Homer. Sudie Bailey, M.D.           DATE OF BIRTH:  04/19/1926   DATE OF PROCEDURE:  08/03/2002  DATE OF DISCHARGE:                                   PROGRESS NOTE   SUBJECTIVE:  She says she is feeling a good deal better today.  Abdominal  pain is improving.  She is very nervous being in the hospital.   OBJECTIVE:  GENERAL:  She is supine in bed, in no acute distress.  Well-  developed, well-nourished, somewhat obese, alert and oriented.  LUNGS:  Clear throughout.  HEART:  Regular rhythm, rate of about 100.  ABDOMEN:  Soft, obese, slightly distended, mildly tender throughout.  EXTREMITIES:  There is no edema of the ankles.   LABORATORY DATA:  Today the white blood cell count is 13,400, of which 90%  neutrophils, 4% lymphs.  Hemoglobin and hematocrit are 11.2 and 32.6.  MET-7  showing a glucose of 286.  The total bilirubin is 2.2, indirect is 1.9,  albumin 2.5, amylase 160, lipase 111.   ASSESSMENT:  1. Pancreatitis, much improved.  2. Cholecystitis.  3. Type 2 diabetes.  4. Anxiety.  5. Hypertension.   PLAN:  1. Start on lorazepam 0.5 mg p.o. t.i.d. p.r.n.  2. I discussed the case at length with Dr. Lovell Sheehan, general surgeon, and he     feels that in several days she will probably be ready for     cholecystectomy.  At that time, further investigation of the common bile     duct can be done.  Meanwhile, she will continue with IV fluids, pain     medications, be essentially n.p.o. to rest the pancreas.  I discussed at     length with the patient and her husband.  3. Continue sliding scale coverage for the diabetes.  4. Continue Enalapril 10 mg b.i.d. for hypertension.                                               Mila Homer. Sudie Bailey, M.D.    SDK/MEDQ  D:  08/03/2002  T:  08/03/2002  Job:  [id number]

## 2010-08-02 NOTE — H&P (Signed)
NAME:  Misty Edwards, Misty Edwards NO.:  [id number]   MEDICAL RECORD NO.:  [id number]                   PATIENT TYPE:  INP   LOCATION:  A209                                 FACILITY:  [facility]   PHYSICIAN:  Gracelyn Nurse, M.D.              DATE OF BIRTH:  [birth year]   DATE OF ADMISSION:  08/14/2002  DATE OF DISCHARGE:                                HISTORY & PHYSICAL   CHIEF COMPLAINT:  Weakness and elevated blood sugar.   HISTORY OF PRESENT ILLNESS:  This is a 77 year old white female who was  discharged from [facility] 5 days ago after having a  cholecystectomy and gallstone pancreatitis. Since discharge she has been  feeling weak. She has had elevated blood sugar and poor p.o. intake. She has  been to her primary care physician and had her oral diabetic medicines  increased; however, her sugars have remained in the 300-400 range.  She has  had no fever or chills.  She has had some abdominal discomfort, but no bad  pain.  She has had some nausea, especially when she eats.   PAST MEDICAL HISTORY:  1. Recent cholecystectomy, recent gallstone pancreatitis.     a. Status post ERCP with sphincterotomy.  2. Non-insulin-dependent diabetes .  3. Hypertension.  4. History of breast cancer.     a. Status post right mastectomy.  5. Status post bladder tack.   ALLERGIES:  No known drug allergies.   CURRENT MEDICATIONS:  1. Glucophage XR 500 mg 2 b.i.d.  2. Avandia 4 mg daily.  3. Vasotec 10 mg daily.  4. Atenolol 50 mg daily.  5. Dobutamine 500 mg q.i.d.  6. Darvocet-N 100 q.4h. p.r.n.  7. Phenergan 25 mg q.4h. p.r.n.   SOCIAL HISTORY:  She does not drink alcohol.  She stopped smoking several  years ago.  She is married and has 5 children.   FAMILY HISTORY:  Father had colon cancer and her mother had ovarian cancer.   REVIEW OF SYSTEMS:  As per HPI.  She has had poor p.o. intake of late.  The  remainder of the systems are negative.   PHYSICAL  EXAMINATION:  VITAL SIGNS:  Temperature 96.7, pulse 65,  respirations 30, blood pressure 106/53.  GENERAL:  This is a well-nourished white female who appears toxic.  HEENT:  Pupils are equal, round, and reacted to light.  Extraocular  movements are intact.  Oral mucosa is dry.  Oropharynx is clear.  CARDIOVASCULAR:  Regular rate and rhythm no murmurs.  LUNGS:  Clear to auscultation.  ABDOMEN:  The abdomen is soft.  Bowel sounds are positive.  Some mild right  upper quadrant tenderness, but no rebound or guarding.  EXTREMITIES:  No edema.  NEUROLOGIC:  Cranial nerves II-XII grossly intact.  No focal deficits.  SKIN:  Skin is moist with no rash.   X-RAY AND LABORATORY DATA:  Admitting Labs, pH  of 7.21, pCO2 of 20, pO2 of  85, bicarb is 8 on room air.  Sodium 137, potassium 4.2, chloride 111, CO2  18, BUN 14, creatinine 1.3, glucose 329.  White blood cells 11.5.  Hemoglobin is 11.7, platelets 411.  Lipase 43, amylase 24, alkaline  phosphatase 277, total bilirubin is 2.1.  SGOT is 55, SGPT is 27, lactic  acid level is 1.8.  CK is 127.   ASSESSMENT AND PLAN:  1. Metabolic acidosis.  This is a confusing picture as the pH and bicarb are     both low on the ABG; however, the bicarb is normal on the BMP.  This     could just be an abdominal process, or it could just be secondary to     medications.  We will go ahead and discontinue her Glucophage as this has     been known to cause a metabolic acidosis.  We will also rule out an     abdominal process with a CT.  We will go ahead and start her on some     antibiotics for now.  We will repeat some labs later on tonight.  I am     not going to give her any bicarb at this point. She seems to be     compensating with a respiratory alkalosis; if her pH remains below 7.25     we may have to start a bicarb drip.  She does not have an anion gap so I     am doubting diabetic ketoacidosis.  2. Hyperglycemia.  Her oral medications do not seem to have much  of an     effect, I wonder if her recent bout of pancreatitis could have damaged     the insulin output of her pancreas to the point that the oral medications     cannot stimulate this any further.  I will go ahead and top her oral     medications and just put her on some insulin to get her blood sugar under     control.  As above, I doubt diabetic ketoacidosis as we do not have an     anion gap acidosis, but I will go ahead and check serum acetone levels.  3. Non-insulin-dependent diabetes.  Will treat as above.  4. Recent cholecystectomy.  I spoke with Dr. Lovell Sheehan who performed the     operation.  He agrees that getting a CT scan to evaluate this would be     warranted.  This would help rule out any complications from the surgery.     He will also see the patient in the morning.  5. Hypertension.  We will continue her current medications.                                               Gracelyn Nurse, M.D.    JDJ/MEDQ  D:  08/14/2002  T:  08/14/2002  Job:  [id number]

## 2010-08-02 NOTE — Consult Note (Signed)
NAME:  Misty Edwards, Misty Edwards                         ACCOUNT NO.:  [id number]   MEDICAL RECORD NO.:  [id number]                   PATIENT TYPE:  INP   LOCATION:  A219                                 FACILITY:  [facility]   PHYSICIAN:  R. Roetta Sessions, M.D.              DATE OF BIRTH:  November 03, 1926   DATE OF CONSULTATION:  08/02/2002  DATE OF DISCHARGE:                                   CONSULTATION   PHYSICIAN REQUESTING CONSULTATION:  Dr. Mila Homer. [place].   REASON FOR CONSULTATION:  Biliary pancreatitis.   HISTORY OF PRESENT ILLNESS:  The patient is a 77 year old Caucasian female  with history of non-insulin-dependent diabetes mellitus, hypertension,  gastroesophageal reflux disease, right breast cancer, status post  mastectomy, who was admitted with acute-onset upper abdominal pain with  radiation between the shoulder blades associated with nausea and vomiting.  Symptoms began early Sunday.  They were progressive throughout the day,  including multiple episodes of vomiting and severe abdominal pain.  She  eventually came to the ED for further evaluation.  The patient also noted a  couple of loose bowel movements.  Denies any hematemesis, melena, rectal  bleeding.  She does have a chronic history of heartburn symptoms and takes  Prilosec p.r.n.  Denies any dysphagia or odynophagia.  Reports a 10-pound  weight loss over the last couple of months.   In the emergency department, white count 11.2, hemoglobin 13.4, hematocrit  38.8, platelet count 155,000; sodium 136, potassium 4.2, BUN 19, creatinine  0.9, glucose 329, total bilirubin 1, alkaline phosphatase 44, SGOT 40, SGPT  29, albumin 4.4, amylase 590, lipase 1500.  She underwent abdominal  ultrasound yesterday which revealed gallbladder wall was mildly prominent,  with cholelithiasis, pericholecystic fluid, positive sonographic Murphy's  sign, common bile duct 7 to 8 mm in diameter, no choledocholithiasis seen  but was questioned.   Yesterday, her LFTs remained essentially unchanged,  except SGOT was slightly up to 49.  Her labs for today are pending.  She  continues to have significant upper abdominal pain.  No further nausea or  vomiting.  Pain may be slightly improved.  She is currently on Unasyn 1.5 g  q.6h.   MEDICATIONS PRIOR TO ADMISSION:  1. Glucotrol XL 500 mg b.i.d.  2. Avandia 4 mg daily.  3. Vasotec 10 mg b.i.d.  4. Tolbutamide daily.  5. Prilosec p.r.n.  6. Tylenol p.r.n.  7. Aspirin 81 mg daily.   ALLERGIES:  No known drug allergies.   PAST MEDICAL HISTORY:  1. Non-insulin-dependent diabetes mellitus.  2. Hypertension.  3. Right breast cancer, status post mastectomy.  4. History of tubal pregnancy, status post surgery.  5. Status post bladder tack.  6. Status post neck surgery.  7. Gastroesophageal reflux disease.  8. Three years ago she had an EGD and was told that she had infection,     questionable H. pylori, and was treated.  She  also had a colonoscopy and     reports having small polyps but otherwise does not know any further     details.   FAMILY HISTORY:  Mother died of ovarian cancer eventually spread to her  colon.  Father died of colon cancer.  She had a brother who had colon  cancer.  Diabetes mellitus prominent in the family.   SOCIAL HISTORY:  She is married and has five children.  She does not smoke  or drink alcohol.   REVIEW OF SYSTEMS:  Please see HPI for GI and for GENERAL:  CARDIOPULMONARY:  Denies any chest pain or shortness of breath.  Currently, she complains of  some palpitations.  GENITOURINARY:  Denies any dysuria or hematuria.   PHYSICAL EXAMINATION:  VITAL SIGNS:  Height 5 feet 2 inches.  Weight 143.  T-  max 99, T current 98.9; pulse 126; respirations 20; blood pressure 167/76.  GENERAL:  Pleasant, well-nourished, well-developed, elderly Caucasian female  in no acute distress.  SKIN:  Skin warm and dry, no jaundice.  HEENT:  Pupils are equal, round and  reactive to light.  Conjunctivae are  pink.  Sclerae nonicteric.  Oropharyngeal mucosa moist.  ENT:  No lesions,  erythema or exudate.  NECK:  No lymphadenopathy or thyromegaly.  CHEST:  Lungs clear to auscultation.  CARDIAC:  Exam reveals slight tachycardia, rate of 120.  No murmurs, rubs,  or gallops.  ABDOMEN:  Positive bowel sounds, soft and nondistended.  She has moderate-to-  severe abdominal pain in the entire upper abdomen to deep palpation.  No  organomegaly or masses.  No rebound tenderness or guarding.  EXTREMITIES:  No edema.   LABORATORY DATA:  Labs as mentioned in HPI; in addition, on Aug 01, 2002,  white count 11.4, hemoglobin 13.5, hematocrit 38.8, platelets 143,000;  sodium 140, potassium 4, BUN 18, creatinine 0.7, glucose 269, total  bilirubin 0.9, alkaline phosphatase 38, SGOT 49, SGPT 28, albumin of 4.   IMPRESSION:  The patient is a pleasant 77 year old female who is suspected  to have biliary pancreatitis.  Abdominal ultrasound is consistent with acute  cholecystitis.  She had cholelithiasis, pericholecystic fluid and slightly  prominent gallbladder wall.  There was no actual stone seen in the common  bile duct.  Common bile duct was only minimally prominent for her age.  She  may have already passed a gallstone or sludge.  Currently, based on  findings, there is no indication for endoscopic retrograde  cholangiopancreatogram.  We will need a followup on today's laboratories to  see if her liver function tests are increasing and if so, she may be a  candidate for endoscopic retrograde cholangiopancreatogram.  Eventually, she  will need to have a surgical consult for cholecystectomy.   SUGGESTIONS:  1. Follow up on today's labs as available.  2. Continue Unasyn.  3. Will add Protonix 40 mg p.o. daily.  4.     Continue n.p.o. with ice chips.  5. No ERCP for now but will assess further after labs available.  I would like to thank Dr. Sudie Bailey for allowing [place] to  take part in the care  of this patient.     Tana Coast, Pricilla Larsson, M.D.    LL/MEDQ  D:  08/02/2002  T:  08/02/2002  Job:  [id number]   cc:   Mila Homer.  Sudie Bailey, M.D.  [address] [place], [place] [address]  Fax: [phone]   R. Roetta Sessions, M.D.  [address]  [place]  [place] [address]  Fax: [phone]

## 2010-08-02 NOTE — Group Therapy Note (Signed)
   NAME:  Misty Edwards, ARTLEY NO.:  [id number]   MEDICAL RECORD NO.:  [id number]                   PATIENT TYPE:  INP   LOCATION:  A219                                 FACILITY:  [facility]   PHYSICIAN:  Mila Homer. Sudie Bailey, M.D.           DATE OF BIRTH:  06-13-1926   DATE OF PROCEDURE:  DATE OF DISCHARGE:                                   PROGRESS NOTE   SUMMARY:  She is still having abdominal pain.  She is able to urinate some.   OBJECTIVE:  Temperature 98.9.  Pulse 126.  Respiratory rate 20.  Blood  pressure 167/76.  She is supine in bed, in distress due to abdominal pain.  She is well developed.  She is oriented and alert.  ABDOMEN:  There is definite abdominal distention, diffuse abdominal pain and  palpation.  HEART:  Regular rhythm noted by 130 supine.  LUNGS:  Clear throughout.  Moving air well.  EXTREMITIES:  There is no edema of the ankles.   Glucoses in the last 24 hours have been 257, 238, 225, 246.   ASSESSMENT:  1. Abdominal pain.  2. Pancreatitis, probably secondary to common bile duct stone.  3. Acute cholecystitis.  4. Type II diabetes.  5. Essential hypertension.   PLAN:  Continue the Unasyn 1.5 grams q.6h I.V., continue I.V. fluids half  normal saline at 125 mL an hour, continue Enalapril 10 mg b.i.d. and sliding  scale Humulin R based on a.c. and h.s. Accuchecks.  Discussed case at length  with Dr. Jena Gauss, gastroenterologist.  Also discussed all of this at length  with her family, including her husband and other family members in the room  this morning.  Blood tests are pending.  If there is indication that the  pancreatitis is improving and there is no hepatitis, will probably wait  several days before proceeding with surgery.  Family has decided to ask Dr.  Lovell Sheehan, general surgeon, to see her.  I will notify him.  Note, I spent 35  minutes with this patient today reviewing her electronic medical record,  discussing with the  gastroenterologist, discussing at length with the  family, and formulating a plan.                                               Mila Homer. Sudie Bailey, M.D.    SDK/MEDQ  D:  08/02/2002  T:  08/02/2002  Job:  [id number]

## 2010-08-02 NOTE — Group Therapy Note (Signed)
   NAME:  Misty, Edwards NO.:  [id number]   MEDICAL RECORD NO.:  [id number]                   PATIENT TYPE:  INP   LOCATION:  A219                                 FACILITY:  [facility]   PHYSICIAN:  Mila Homer. Sudie Bailey, M.D.           DATE OF BIRTH:  03-22-1926   DATE OF PROCEDURE:  08/04/2002  DATE OF DISCHARGE:                                   PROGRESS NOTE   SUBJECTIVE:  She is feeling better.  Abdomen does not hurt as much.   OBJECTIVE:  Temperature 97.9, pulse 114, respiratory rate 20, blood pressure  160/77.  She is supine in bed, well-developed, well-nourished, oriented and  alert, no acute distress.  Heart has a regular rhythm, rate of about 110.  Lungs clear throughout, moving air well.  Abdomen is soft with minimal  tenderness; she is mildly distended.  There is no hepatosplenomegaly or  mass.  No more point tenderness today.  There is no edema of the ankles.  Skin turgor is normal.   Today's white cell count is 11,200, H&H 10.6/30.8.  Differential showed 89%  neutrophils, 5 lymphs.  Bicarb was 17, glucose 226, total bilirubin 1.8,  albumin 2.6.  Today her amylase is down to 65 and lipase 57.   ASSESSMENT:  1. Pancreatitis, much improved.  2. Cholecystitis.  3. Type 2 diabetes.  4. Anemia.  5. Electrolyte abnormality.   PLAN:  Continue IV fluids.  Clear liquids today.  Surgery planned for  tomorrow for laparoscopic cholecystectomy and intraoperative cholangiogram.                                               Mila Homer. Sudie Bailey, M.D.    SDK/MEDQ  D:  08/04/2002  T:  08/04/2002  Job:  [id number]

## 2010-08-02 NOTE — Group Therapy Note (Signed)
   NAME:  Misty Edwards, HUBBS NO.:  [id number]   MEDICAL RECORD NO.:  [id number]                   PATIENT TYPE:  INP   LOCATION:  A219                                 FACILITY:  [facility]   PHYSICIAN:  Mila Homer. Sudie Bailey, M.D.           DATE OF BIRTH:  09-19-1926   DATE OF PROCEDURE:  08/01/2002  DATE OF DISCHARGE:                                   PROGRESS NOTE   SUBJECTIVE:  The patient was admitted last night with abdominal pain and  elevated lipase and amylase.  She is still having pain today, and is  requiring pain medications according to her about every six hours.   PHYSICAL EXAMINATION:  VITAL SIGNS:  Temperature 99 degrees, pulse 100,  respiratory rate 20, blood pressure 187/84, early was 183/64.  Weight is 143  pounds at the time of my examination.  HEART:  Regular rhythm, rate of 120.  LUNGS:  Clear.  ABDOMEN:  Soft, but diffusely tender throughout.  EXTREMITIES:  No edema of the ankles.   LABORATORY DATA:  Her white blood cell count today is 11,400, with 92  neutrophils, 3 lymphs, about the same as yesterday, was 11,200.  Her MET-7  showed a glucose of 269, down from 329 on admission, alkaline phosphatase is  38, SGOT 49, amylase is 590, lipase is 1500.  Urinalysis showed + glucose, +  ketones, 10 white blood cells per high powered field, positive nitrite.  The  glucoses were 246 from 286 this morning.   She had her abdominal ultrasound today which showed cholelithiasis with  pericholecystic fluid and a sonographic Murphy's sign, and mild gallbladder  wall thickening compatible with acute cholecystitis.  There is common bile  duct dilatation, question of choledocholithiasis.  There is a homogenous  pancreas, possibly consistent with pancreatitis, a small amount of cystic  fluid.   ASSESSMENT:  1. Pancreatitis.  2. Cholecystitis.  3. Probable common bile duct stone.  4. Type 2 diabetes.   PLAN:  1. Give her Unasyn 1.5 g IV q.6h. with IV  fluids, morphine, Promethazine IV.  2. Switch IV to 1/2 normal saline at 125 cc an hour.  3. Continue sliding scale insulin and Accu-Checks.                                               Mila Homer. Sudie Bailey, M.D.    SDK/MEDQ  D:  08/01/2002  T:  08/02/2002  Job:  [id number]

## 2010-08-02 NOTE — Op Note (Signed)
NAME:  Misty Edwards, Misty Edwards                         ACCOUNT NO.:  [id number]   MEDICAL RECORD NO.:  [id number]                   PATIENT TYPE:  INP   LOCATION:  A209                                 FACILITY:  [facility]   PHYSICIAN:  Lionel December, M.D.                 DATE OF BIRTH:  06/26/1926   DATE OF PROCEDURE:  08/16/2002  DATE OF DISCHARGE:                                 OPERATIVE REPORT   PROCEDURE PERFORMED:  Esophagogastroduodenoscopy.   INDICATIONS FOR PROCEDURE:  [name] is a 77 year old Caucasian female who  was briefly hospitalized with biliary pancreatitis and underwent  laparoscopic cholecystectomy followed by endoscopic retrograde  cholangiopancreatography with removal of stones from a bile duct.  She how  presents with pancreatic phlegmon and melena.  She also has had drop in her  hemoglobin and hematocrit.  She could have a stress-induced peptic ulcer  disease.  She is undergoing diagnostic esophagogastroduodenoscopy.  The  procedure was reviewed with the patient and informed consent was obtained.   PREOP MEDICATIONS:  Cetacaine spray for pharyngeal topical anesthesia.  Demerol 45 mg IV, Versed 2 mg IV.   INSTRUMENT USED:  Olympus video system.   DESCRIPTION OF PROCEDURE:  Procedure performed in endoscopy suite.  The  patient's vital signs and oxygen saturation were monitored during the  procedure and remained stable.  The patient was placed in the left lateral  position and endoscope was padded via oropharynx without any difficulty into  the esophagus.   Esophagus:  The mucosa of the esophagus was normal __________ .  Squamocolumnar junction was unremarkable.   Stomach:  Has a scant amount of food debris.  Stomach distended very well  with insufflation.  Folds of proximal stomach were normal.  Examination of  mucosa revealed few petechiae with fresh blood gastric body along the  anterior wall, antral mucosa was normal.  Pyloric channel was patent.  Angularis,  fundus and cardia were examined by __________ and were normal.   Duodenum:  Examination of the bulb revealed normal mucosa.  Scope was passed  to second part of the duodenal mucosa and folds were normal.  Ampulla was  also seen and there was no stigmata of bleeding.  There was old yellow bile  in the duodenum.  Endoscope was withdrawn.  The patient tolerated the  procedure well.   FINAL DIAGNOSIS:  Gastritis of body.  Otherwise normal  esophagogastroduodenoscopy.  Suspect gastrointestinal bleeding could be  secondary to gastritis, recent sphincterotomy or source could be pancreas  given that she has significant necrosis as evidenced on CT.   RECOMMENDATIONS:  She will go back on clear liquids.   Will continue IV PPI.  If she tolerates clear liquids, will advance diet to  full liquids and add pancreatic enzyme supplement.  Lionel December, M.D.    NR/MEDQ  D:  08/16/2002  T:  08/16/2002  Job:  [id number]   cc:   Gracelyn Nurse, M.D.  [address]  [place] [address]  Fax: [phone]

## 2010-08-23 ENCOUNTER — Encounter: Payer: Self-pay | Admitting: Internal Medicine

## 2011-01-02 ENCOUNTER — Other Ambulatory Visit (HOSPITAL_COMMUNITY): Payer: Self-pay | Admitting: Family Medicine

## 2011-01-02 DIAGNOSIS — Z139 Encounter for screening, unspecified: Secondary | ICD-10-CM

## 2011-01-10 ENCOUNTER — Ambulatory Visit (HOSPITAL_COMMUNITY): Payer: Self-pay

## 2011-01-14 ENCOUNTER — Ambulatory Visit (HOSPITAL_COMMUNITY): Payer: Medicare Other

## 2011-01-20 ENCOUNTER — Ambulatory Visit (HOSPITAL_COMMUNITY)
Admission: RE | Admit: 2011-01-20 | Discharge: 2011-01-20 | Disposition: A | Discharge status: Home / Self Care | Payer: Medicare Other | Source: Ambulatory Visit | Attending: Family Medicine | Admitting: Family Medicine

## 2011-01-20 DIAGNOSIS — Z1231 Encounter for screening mammogram for malignant neoplasm of breast: Secondary | ICD-10-CM | POA: Insufficient documentation

## 2011-01-20 DIAGNOSIS — Z139 Encounter for screening, unspecified: Secondary | ICD-10-CM

## 2011-08-15 ENCOUNTER — Ambulatory Visit: Payer: Medicare Other | Admitting: Urgent Care

## 2011-08-19 ENCOUNTER — Ambulatory Visit: Payer: Medicare Other | Admitting: Gastroenterology

## 2011-08-19 ENCOUNTER — Encounter: Payer: Self-pay | Admitting: Internal Medicine

## 2011-08-21 ENCOUNTER — Ambulatory Visit (INDEPENDENT_AMBULATORY_CARE_PROVIDER_SITE_OTHER): Payer: Medicare Other | Admitting: Urgent Care

## 2011-08-21 ENCOUNTER — Encounter: Payer: Self-pay | Admitting: Urgent Care

## 2011-08-21 VITALS — BP 143/72 | HR 65 | Temp 98.4°F | Ht 63.0 in | Wt 142.4 lb

## 2011-08-21 DIAGNOSIS — R1032 Left lower quadrant pain: Secondary | ICD-10-CM | POA: Insufficient documentation

## 2011-08-21 DIAGNOSIS — Z800 Family history of malignant neoplasm of digestive organs: Secondary | ICD-10-CM

## 2011-08-21 DIAGNOSIS — K862 Cyst of pancreas: Secondary | ICD-10-CM

## 2011-08-21 DIAGNOSIS — K861 Other chronic pancreatitis: Secondary | ICD-10-CM

## 2011-08-21 DIAGNOSIS — K5900 Constipation, unspecified: Secondary | ICD-10-CM

## 2011-08-21 LAB — TSH: TSH: 0.753 u[IU]/mL (ref 0.350–4.500)

## 2011-08-21 LAB — BASIC METABOLIC PANEL
CO2: 27 mEq/L (ref 19–32)
Calcium: 9.7 mg/dL (ref 8.4–10.5)
Chloride: 99 mEq/L (ref 96–112)
Creat: 0.93 mg/dL (ref 0.50–1.10)
Glucose, Bld: 136 mg/dL — ABNORMAL HIGH (ref 70–99)

## 2011-08-21 LAB — CBC WITH DIFFERENTIAL/PLATELET
Basophils Absolute: 0 10*3/uL (ref 0.0–0.1)
Basophils Relative: 0 % (ref 0–1)
MCH: 27.6 pg (ref 26.0–34.0)
MCHC: 33.5 g/dL (ref 30.0–36.0)
MCV: 82.2 fL (ref 78.0–100.0)
Neutro Abs: 3.6 10*3/uL (ref 1.7–7.7)

## 2011-08-21 LAB — HEPATIC FUNCTION PANEL
ALT: 13 U/L (ref 0–35)
Bilirubin, Direct: 0.1 mg/dL (ref 0.0–0.3)
Indirect Bilirubin: 0.4 mg/dL (ref 0.0–0.9)
Total Bilirubin: 0.5 mg/dL (ref 0.3–1.2)
Total Protein: 7.2 g/dL (ref 6.0–8.3)

## 2011-08-21 NOTE — Progress Notes (Signed)
Referring Provider: Milana Obey, MD Primary Care Physician:  Milana Obey, MD, MD Primary Gastroenterologist:  Dr. Jena Gauss  Chief Complaint  Patient presents with  . Constipation    FH colon ca   HPI:  Misty Edwards is a 78 y.o. female here for follow up to talk about setting up colonoscopy.  She has hx chronic pancreatitis & pancreatic pseudocysts. She has done very well on pancreatic enzymes without any chronic pain concerns.  She has family hx of colon cancer.  She is not sure she wants to pursue colonoscopy at this time given her age.  She really does not want to complete prep.  C/o incomplete evacuation w/ hard stools.  C/o abdominal bloating.  Cant lay on her side at times after eating.  C/o LLQ sharp pains that started last night.  Not tried any meds for constipation other than stool softeners.  BM daily.  Denies rectal bleeding or melena.  Takes zantac 1/2 tablet when she eats lettuce or onions.  This seems to work well.  She takes zantac a couple times per month.  Denies any upper GI symptoms including heartburn, indigestion, nausea, vomiting, dysphagia, odynophagia or anorexia.  Weight stable.  Denies fever or chills.    Past Medical History  Diagnosis Date  . Hypertension   . History of carcinoma in situ of breast 1988  . Acute biliary pancreatitis 07/2002    thia was in 07/2004:she still has pseudocyst in tail of pancreas measuring 54 x 33 mm   . Upper GI bleed September2004    secondary to gastritis  . Diabetes mellitus   . Adrenal adenoma     bilateral  . Detached retina   . Chronic pancreatitis     Past Surgical History  Procedure Date  . Colonoscopy 08/15/05    few tiny diverticula at sigmoid colon/external hemorrhoids but no polyps  . Right mastectomy   . Ectopic pregnancy surgery 1950's  . Tacking up of her bladder   . Laparoscopic cholecystectomy   . Ercp with sphincterotomy 07/2002  . Pancreatic pseudocyst drainage June2004    drained percutaneously       Current Outpatient Prescriptions  Medication Sig Dispense Refill  . atenolol (TENORMIN) 25 MG tablet Take 25 mg by mouth daily.       [name] Peroxide (EAR DROPS OT) Place in ear(s) as needed.      . cetirizine (ZYRTEC) 10 MG tablet Take 10 mg by mouth daily.      Marland Kitchen CREON 24000 UNITS CPEP 24,000 Units. 2 w/ meals, 1 w/ snacks      . cycloSPORINE (RESTASIS) 0.05 % ophthalmic emulsion Place 1 drop into both eyes 3 (three) times daily.       Marland Kitchen docusate sodium (COLACE) 100 MG capsule Take 100 mg by mouth 2 (two) times daily as needed.       . enalapril (VASOTEC) 20 MG tablet Take 20 mg by mouth daily.       Marland Kitchen esomeprazole (NEXIUM) 40 MG capsule Take 40 mg by mouth daily before breakfast.      . EVISTA 60 MG tablet Take 60 mg by mouth daily.       . fluticasone (VERAMYST) 27.5 MCG/SPRAY nasal spray Place 2 sprays into the nose daily.      Marland Kitchen HUMULIN N 100 UNIT/ML injection Inject 42 Units into the skin 2 (two) times daily. 42 units in am  8 units in pm       . hydrochlorothiazide (  HYDRODIURIL) 25 MG tablet Take 25 mg by mouth daily.       Marland Kitchen LORazepam (ATIVAN) 0.5 MG tablet Take 0.5 mg by mouth every 8 (eight) hours.      . lovastatin (MEVACOR) 20 MG tablet Take 20 mg by mouth at bedtime.       . metFORMIN (GLUCOPHAGE-XR) 500 MG 24 hr tablet Take 500 mg by mouth daily with breakfast.       . Multiple Vitamin (MULTIVITAMIN) capsule Take 1 capsule by mouth daily.      . naproxen sodium (ANAPROX) 220 MG tablet Take 220 mg by mouth 2 (two) times daily with a meal.      . nitrofurantoin (MACRODANTIN) 50 MG capsule Take 50 mg by mouth at bedtime.      . promethazine (PHENERGAN) 25 MG tablet Take 25 mg by mouth every 6 (six) hours as needed.      . zolpidem (AMBIEN) 10 MG tablet Take 10 mg by mouth at bedtime as needed.         Allergies as of 08/21/2011 - Review Complete 08/21/2011  Allergen Reaction Noted  . Calcium-containing compounds Nausea Only 08/21/2011  . Vitamin d analogs Nausea  Only 08/21/2011    Review of Systems: Gen: Denies any fever, chills, sweats, anorexia, fatigue, weakness, malaise, weight loss, and sleep disorder. CV: Denies chest pain, angina, palpitations, syncope, orthopnea, PND, peripheral edema, and claudication. Resp: Denies dyspnea at rest, dyspnea with exercise, cough, sputum, wheezing, coughing up blood, and pleurisy. GI: Denies vomiting blood, jaundice, and fecal incontinence. Derm: Denies rash, itching, dry skin, hives, moles, warts, or unhealing ulcers.  Psych: Denies depression, anxiety, memory loss, suicidal ideation, hallucinations, paranoia, and confusion. Heme: Denies bruising, bleeding, and enlarged lymph nodes.  Physical Exam: BP 143/72  Pulse 65  Temp(Src) 98.4 F (36.9 C) (Temporal)  Ht 5\' 3"  (1.6 m)  Wt 142 lb 6.4 oz (64.592 kg)  BMI 25.22 kg/m2 General:   Alert,  Well-developed, well-nourished, pleasant and cooperative in NAD.  Accompanied by her daughter. Eyes:  Sclera clear, no icterus.   Conjunctiva pink. Mouth:  No deformity or lesions, oropharynx pink and moist. Neck:  Supple; no masses or thyromegaly. Heart:  Regular rate and rhythm; no murmurs, clicks, rubs,  or gallops. Abdomen:  Normal bowel sounds.  No bruits.  Soft, non-tender and non-distended without masses, hepatosplenomegaly or hernias noted.  No guarding or rebound tenderness.   Rectal:  Deferred. Msk:  Symmetrical without gross deformities.  Pulses:  Normal pulses noted. Extremities:  No clubbing or edema. Neurologic:  Alert and oriented x4;  grossly normal neurologically. Skin:  Intact without significant lesions or rashes.

## 2011-08-21 NOTE — Patient Instructions (Signed)
Call if you decide you want to proceed with colonoscopy Go get your lab work today If you need something for pain please call me Use MiraLax 17 g daily as needed for constipation Begin align daily

## 2011-08-22 ENCOUNTER — Encounter: Payer: Self-pay | Admitting: Urgent Care

## 2011-08-22 DIAGNOSIS — Z800 Family history of malignant neoplasm of digestive organs: Secondary | ICD-10-CM | POA: Insufficient documentation

## 2011-08-22 NOTE — Assessment & Plan Note (Signed)
Doing well until acute onset left sided pain in past 24 hrs.  Doubt this is due to her pancreatitis.  Continue pancreatic enzymes.

## 2011-08-22 NOTE — Assessment & Plan Note (Signed)
Very spry 78 y/o female due for high-risk screening colonoscopy, however pt declines at this time given her age.  I feel this is reasonable.  She will call if she changes her mind.

## 2011-08-22 NOTE — Assessment & Plan Note (Signed)
Misty Edwards is a pleasant 78 y.o. female in her usual state of health with acute onset LLQ pain.  She has hx chronic pancreatitis & pseudocysts but really has done very well for the past several years until the last 24 hrs.  No significant associated symptoms with pain such as N/V, fever, diarrhea, etc.  ?chronic pancreatitis flare, evolving acute illness, vs. Pain secondary to chronic constipation/obstipation.    CBC, LFTs, lipase Offered pain meds, pt declined.  Instructed to ER if severe pain  If you need something for pain please call me Use MiraLax 17 g daily as needed for constipation Begin align daily

## 2011-08-22 NOTE — Assessment & Plan Note (Signed)
Call if you decide you want to proceed with colonoscopy TSH, Met 7 Use MiraLax 17 g daily as needed for constipation Begin align daily

## 2011-08-22 NOTE — Progress Notes (Signed)
Quick Note:  Please let pt know her hgb & platelets are a bit low, not much. Be sure she is drinking plenty fluids/eating meals. Did she decide about colonoscopy? Has she ever been anemic before? Cc:KNOWLTON,STEPHEN D, MD  ______ 

## 2011-08-25 NOTE — Progress Notes (Signed)
Faxed to PCP

## 2011-08-26 NOTE — Progress Notes (Signed)
Quick Note:  Please let pt know her hgb & platelets are a bit low, not much. Be sure she is drinking plenty fluids/eating meals. Did she decide about colonoscopy? Has she ever been anemic before? OZ:HYQMVHQI,ONGEXBM D, MD  ______

## 2011-10-15 LAB — COMPREHENSIVE METABOLIC PANEL
ALT: 15 U/L (ref 7–35)
AST: 18 U/L
Alkaline Phosphatase: 33 U/L
BUN: 15 mg/dL (ref 4–21)
Cholesterol: 112 mg/dL (ref 0–200)
Glucose: 129
Hgb A1c MFr Bld: 6.6 % — AB (ref 4.0–6.0)

## 2011-10-15 LAB — CBC WITH DIFFERENTIAL/PLATELET
WBC: 4.4
platelet count: 116

## 2011-10-16 NOTE — Progress Notes (Signed)
REVIEWED.  

## 2011-12-01 ENCOUNTER — Encounter: Payer: Self-pay | Admitting: *Deleted

## 2011-12-16 ENCOUNTER — Encounter: Payer: Self-pay | Admitting: Internal Medicine

## 2011-12-17 ENCOUNTER — Ambulatory Visit (INDEPENDENT_AMBULATORY_CARE_PROVIDER_SITE_OTHER): Payer: Medicare Other | Admitting: Gastroenterology

## 2011-12-17 ENCOUNTER — Encounter: Payer: Self-pay | Admitting: Gastroenterology

## 2011-12-17 ENCOUNTER — Other Ambulatory Visit: Payer: Self-pay | Admitting: Internal Medicine

## 2011-12-17 VITALS — BP 152/69 | HR 66 | Temp 98.1°F | Ht 62.0 in | Wt 142.0 lb

## 2011-12-17 DIAGNOSIS — Z86010 Personal history of colonic polyps: Secondary | ICD-10-CM | POA: Insufficient documentation

## 2011-12-17 DIAGNOSIS — D649 Anemia, unspecified: Secondary | ICD-10-CM

## 2011-12-17 DIAGNOSIS — R109 Unspecified abdominal pain: Secondary | ICD-10-CM | POA: Insufficient documentation

## 2011-12-17 DIAGNOSIS — Z800 Family history of malignant neoplasm of digestive organs: Secondary | ICD-10-CM

## 2011-12-17 DIAGNOSIS — K5900 Constipation, unspecified: Secondary | ICD-10-CM | POA: Insufficient documentation

## 2011-12-17 MED ORDER — PEG 3350-KCL-NA BICARB-NACL 420 G PO SOLR: 4000.0000 mL | ORAL | Status: DC

## 2011-12-17 NOTE — Patient Instructions (Addendum)
We have scheduled you for an upper endoscopy and colonoscopy with Dr. Jena Gauss. Please see separate instructions. Day of your bowel prep, you will take metformin 250 mg in the morning, Humulin 20 units in the morning and 4 units in the evening.

## 2011-12-17 NOTE — Progress Notes (Signed)
Faxed to PCP

## 2011-12-17 NOTE — Assessment & Plan Note (Signed)
Take Align one daily for four weeks. Take Benefiber daily.

## 2011-12-17 NOTE — Progress Notes (Signed)
Primary Care Physician:  KNOWLTON,STEPHEN D, MD  Primary Gastroenterologist:  Michael Rourk, MD   Chief Complaint  Patient presents with  . EGD  . Colonoscopy    HPI:  Misty Edwards is a 78 y.o. female here at the request of Dr. Stephen Knowlton for further evaluation of anemia, consideration of upper endoscopy and colonoscopy. She was last seen back in June 2013 in our practice. She is a history of chronic pancreatitis, pancreatic pseudocyst, family history of colon cancer and gives personal history of colon polyps. Last colonoscopy was in June 2007, she a few tiny diverticula at the sigmoid colon/external hemorrhoids, but no polyps. Her last EGD was in 2004, gastritis. Back in 08/2011, patient's hemoglobin was 11.6, hematocrit 34.6, platelets 128,000. She declined having colonoscopy at that time.  Intermittent constipation. Taking Align, Benefiber when needed. Stools mostly every day. Stools not hard. No melena, brbpr. Some vague abdominal pain resolves with belching. Lots of stomach gurgling. No heartburn on Nexium. No dysphagia. No odynophagia. Has been on Aleve off and on, but none since June. No other NSAIDs. Under a lot of stress taking care of husband with dementia.     Current Outpatient Prescriptions  Medication Sig Dispense Refill  . atenolol (TENORMIN) 25 MG tablet Take 25 mg by mouth daily.       . Carbamide Peroxide (EAR DROPS OT) Place in ear(s) as needed.      . cetirizine (ZYRTEC) 10 MG tablet Take 10 mg by mouth daily.      . CREON 24000 UNITS CPEP 24,000 Units. 2 w/ meals, 1 w/ snacks      . cycloSPORINE (RESTASIS) 0.05 % ophthalmic emulsion Place 1 drop into both eyes 3 (three) times daily.       . docusate sodium (COLACE) 100 MG capsule Take 100 mg by mouth 2 (two) times daily as needed.       . enalapril (VASOTEC) 20 MG tablet Take 10 mg by mouth 2 (two) times daily. Takes 10mg at lunch and 20mg at night.      . esomeprazole (NEXIUM) 40 MG capsule Take 40 mg by mouth  daily before breakfast.      . EVISTA 60 MG tablet Take 60 mg by mouth daily.       . fluticasone (VERAMYST) 27.5 MCG/SPRAY nasal spray Place 2 sprays into the nose daily.      . HUMULIN N 100 UNIT/ML injection Inject 42 Units into the skin 2 (two) times daily. 42 units in am  8 units in pm       . hydrochlorothiazide (HYDRODIURIL) 25 MG tablet Take 25 mg by mouth daily.       . LORazepam (ATIVAN) 0.5 MG tablet Take 0.5 mg by mouth every 8 (eight) hours.      . lovastatin (MEVACOR) 20 MG tablet Take 20 mg by mouth at bedtime.       . metFORMIN (GLUCOPHAGE-XR) 500 MG 24 hr tablet Take 500 mg by mouth daily with breakfast.       . Multiple Vitamin (MULTIVITAMIN) capsule Take 1 capsule by mouth daily.      . nitrofurantoin (MACRODANTIN) 50 MG capsule Take 50 mg by mouth at bedtime.      . promethazine (PHENERGAN) 25 MG tablet Take 25 mg by mouth every 6 (six) hours as needed.      . zolpidem (AMBIEN) 10 MG tablet Take 10 mg by mouth at bedtime as needed.           Allergies as of 12/17/2011 - Review Complete 12/17/2011  Allergen Reaction Noted  . Calcium-containing compounds Nausea Only 08/21/2011  . Vitamin d analogs Nausea Only 08/21/2011    Past Medical History  Diagnosis Date  . Hypertension   . History of carcinoma in situ of breast 1988  . Acute biliary pancreatitis 07/2002    thia was in 07/2004:she still has pseudocyst in tail of pancreas measuring 54 x 33 mm   . Upper GI bleed September2004    secondary to gastritis  . Diabetes mellitus   . Adrenal adenoma     bilateral  . Detached retina   . Chronic pancreatitis   . Hyperlipidemia   . Osteopenia   . IBS (irritable bowel syndrome)   . Legally blind   . Anxiety   . Osteoarthritis     Past Surgical History  Procedure Date  . Colonoscopy 08/15/05    few tiny diverticula at sigmoid colon/external hemorrhoids but no polyps  . Right mastectomy   . Ectopic pregnancy surgery 1950's  . Tacking up of her bladder   .  Laparoscopic cholecystectomy   . Ercp with sphincterotomy 07/2002  . Pancreatic pseudocyst drainage June2004    drained percutaneously   . Esophagogastroduodenoscopy      Gastritis of body.  Otherwise normal    Family History  Problem Relation Age of Onset  . Colon cancer Father 60    passed away in his 60's  . Colon cancer Brother 50    surgery inhis 50's doing well now  . Heart attack Brother     passed away age 74  . Pancreatitis Brother   . Ovarian cancer Mother     passed  . Kidney disease Daughter   . Leukemia Son     History   Social History  . Marital Status: Married    Spouse Name: N/A    Number of Children: 5  . Years of Education: N/A   Occupational History  . homemaker    Social History Main Topics  . Smoking status: Never Smoker   . Smokeless tobacco: Not on file  . Alcohol Use: No  . Drug Use: No  . Sexually Active: Not on file   Other Topics Concern  . Not on file   Social History Narrative  . No narrative on file      ROS:  General: Negative for anorexia, weight loss, fever, chills, fatigue, weakness. Eyes: Negative for vision changes.  ENT: Negative for hoarseness, difficulty swallowing , nasal congestion. CV: Negative for chest pain, angina, palpitations, dyspnea on exertion, peripheral edema.  Respiratory: Negative for dyspnea at rest, dyspnea on exertion, cough, sputum, wheezing.  GI: See history of present illness. GU:  Negative for dysuria, hematuria, urinary incontinence, urinary frequency, nocturnal urination.  MS: Negative for joint pain, low back pain.  Derm: Negative for rash or itching.  Neuro: Negative for weakness, abnormal sensation, seizure, frequent headaches, memory loss, confusion.  Psych: Negative for anxiety, depression, suicidal ideation, hallucinations.  Endo: Negative for unusual weight change.  Heme: Negative for bruising or bleeding. Allergy: Negative for rash or hives.    Physical Examination:  BP 152/69   Pulse 66  Temp 98.1 F (36.7 C) (Temporal)  Ht 5' 2" (1.575 m)  Wt 142 lb (64.411 kg)  BMI 25.97 kg/m2   General: Well-nourished, well-developed in no acute distress. Accompanied by dgt. Head: Normocephalic, atraumatic.   Eyes: Conjunctiva pink, no icterus. Mouth: Oropharyngeal mucosa moist and pink , no lesions   erythema or exudate. Neck: Supple without thyromegaly, masses, or lymphadenopathy.  Lungs: Clear to auscultation bilaterally.  Heart: Regular rate and rhythm, no murmurs rubs or gallops.  Abdomen: Bowel sounds are normal, nontender, nondistended, no hepatosplenomegaly or masses, no abdominal bruits or    hernia , no rebound or guarding.   Rectal: not performed Extremities: No lower extremity edema. No clubbing or deformities.  Neuro: Alert and oriented x 4 , grossly normal neurologically.  Skin: Warm and dry, no rash or jaundice.   Psych: Alert and cooperative, normal mood and affect.  Labs: Labs from 10/16/2011, total bilirubin 0.5, alkaline phosphatase 33, AST 18, ALT 15, albumin 4.5, sodium 136, potassium 4.5, calcium 9.7, creatinine 0.96, BUN 15, glucose 129, total cholesterol 112, he may 166.6, white blood cell count 4400, hemoglobin 11.3, hematocrit 33.9, MCV 83.8, platelets 116,000.  Imaging Studies: No results found.    

## 2011-12-17 NOTE — Assessment & Plan Note (Signed)
Stable normocytic anemia, mild thrombocytopenia without h/o liver disease. Patient with personal history of breast cancer, reported h/o colon polyps, FH of two first degree relatives with colon cancer at relatively young age.   Chronic constipation. Chronic vague upper abdominal pain with intermittent Aleve use, indigestion. Offered patient EGD/TCS for further evaluation.  I have discussed the risks, alternatives, benefits with regards to but not limited to the risk of reaction to medication, bleeding, infection, perforation and the patient is agreeable to proceed. Written consent to be obtained.  She reports inadequate sedation with last colonoscopy in 2007. Will augment conscious sedation with phenergan 12.5mg  IV 30 minutes before procedure.   Based on EGD/TCS findings, we may need to consider abd u/s to rule out splenomegaly or liver disease.

## 2011-12-24 ENCOUNTER — Encounter (HOSPITAL_COMMUNITY): Payer: Self-pay | Admitting: Pharmacy Technician

## 2012-01-07 MED ORDER — SODIUM CHLORIDE 0.45 % IV SOLN
INTRAVENOUS | Status: DC
  Administered 2012-01-08: 08:00:00 via INTRAVENOUS

## 2012-01-08 ENCOUNTER — Encounter (HOSPITAL_COMMUNITY): Payer: Self-pay | Admitting: *Deleted

## 2012-01-08 ENCOUNTER — Ambulatory Visit (HOSPITAL_COMMUNITY)
Admission: RE | Admit: 2012-01-08 | Discharge: 2012-01-08 | Disposition: A | Discharge status: Home Health | Payer: Medicare Other | Source: Ambulatory Visit | Attending: Internal Medicine | Admitting: Internal Medicine

## 2012-01-08 ENCOUNTER — Encounter (HOSPITAL_COMMUNITY)
Admission: RE | Disposition: A | Discharge status: Home Health | Payer: Self-pay | Source: Ambulatory Visit | Attending: Internal Medicine

## 2012-01-08 DIAGNOSIS — K5730 Diverticulosis of large intestine without perforation or abscess without bleeding: Secondary | ICD-10-CM | POA: Insufficient documentation

## 2012-01-08 DIAGNOSIS — E119 Type 2 diabetes mellitus without complications: Secondary | ICD-10-CM | POA: Insufficient documentation

## 2012-01-08 DIAGNOSIS — D649 Anemia, unspecified: Secondary | ICD-10-CM

## 2012-01-08 DIAGNOSIS — K648 Other hemorrhoids: Secondary | ICD-10-CM | POA: Insufficient documentation

## 2012-01-08 DIAGNOSIS — R109 Unspecified abdominal pain: Secondary | ICD-10-CM

## 2012-01-08 DIAGNOSIS — K2960 Other gastritis without bleeding: Secondary | ICD-10-CM

## 2012-01-08 DIAGNOSIS — Z800 Family history of malignant neoplasm of digestive organs: Secondary | ICD-10-CM | POA: Insufficient documentation

## 2012-01-08 DIAGNOSIS — Z01812 Encounter for preprocedural laboratory examination: Secondary | ICD-10-CM | POA: Insufficient documentation

## 2012-01-08 DIAGNOSIS — Z860100 Personal history of colon polyps, unspecified: Secondary | ICD-10-CM | POA: Insufficient documentation

## 2012-01-08 DIAGNOSIS — I10 Essential (primary) hypertension: Secondary | ICD-10-CM | POA: Insufficient documentation

## 2012-01-08 DIAGNOSIS — K2940 Chronic atrophic gastritis without bleeding: Secondary | ICD-10-CM | POA: Insufficient documentation

## 2012-01-08 HISTORY — PX: ESOPHAGOGASTRODUODENOSCOPY: SHX1529

## 2012-01-08 HISTORY — PX: COLONOSCOPY: SHX174

## 2012-01-08 SURGERY — COLONOSCOPY WITH ESOPHAGOGASTRODUODENOSCOPY (EGD)
Anesthesia: Moderate Sedation

## 2012-01-08 MED ORDER — STERILE WATER FOR IRRIGATION IR SOLN
Status: DC | PRN
  Administered 2012-01-08: 09:00:00

## 2012-01-08 MED ORDER — MEPERIDINE HCL 100 MG/ML IJ SOLN
INTRAMUSCULAR | Status: DC | PRN
  Administered 2012-01-08 (×2): 25 mg via INTRAVENOUS
  Administered 2012-01-08: 50 mg via INTRAVENOUS

## 2012-01-08 MED ORDER — SODIUM CHLORIDE 0.9 % IJ SOLN
INTRAMUSCULAR | Status: AC
  Filled 2012-01-08: qty 10

## 2012-01-08 MED ORDER — PROMETHAZINE HCL 25 MG/ML IJ SOLN
12.5000 mg | Freq: Once | INTRAMUSCULAR | Status: AC
  Administered 2012-01-08: 12.5 mg via INTRAVENOUS

## 2012-01-08 MED ORDER — MIDAZOLAM HCL 5 MG/5ML IJ SOLN
INTRAMUSCULAR | Status: DC | PRN
  Administered 2012-01-08: 1 mg via INTRAVENOUS
  Administered 2012-01-08: 2 mg via INTRAVENOUS
  Administered 2012-01-08 (×3): 1 mg via INTRAVENOUS

## 2012-01-08 MED ORDER — PROMETHAZINE HCL 25 MG/ML IJ SOLN
INTRAMUSCULAR | Status: AC
  Filled 2012-01-08: qty 1

## 2012-01-08 MED ORDER — MIDAZOLAM HCL 5 MG/5ML IJ SOLN
INTRAMUSCULAR | Status: AC
  Filled 2012-01-08: qty 5

## 2012-01-08 MED ORDER — MEPERIDINE HCL 100 MG/ML IJ SOLN
INTRAMUSCULAR | Status: AC
  Filled 2012-01-08: qty 1

## 2012-01-08 NOTE — H&P (View-Only) (Signed)
Primary Care Physician:  Milana Obey, MD  Primary Gastroenterologist:  Roetta Sessions, MD   Chief Complaint  Patient presents with  . EGD  . Colonoscopy    HPI:  Misty Edwards is a 78 y.o. female here at the request of Dr. John Edwards for further evaluation of anemia, consideration of upper endoscopy and colonoscopy. She was last seen back in June 2013 in our practice. She is a history of chronic pancreatitis, pancreatic pseudocyst, family history of colon cancer and gives personal history of colon polyps. Last colonoscopy was in June 2007, she a few tiny diverticula at the sigmoid colon/external hemorrhoids, but no polyps. Her last EGD was in 2004, gastritis. Back in 08/2011, patient's hemoglobin was 11.6, hematocrit 34.6, platelets 128,000. She declined having colonoscopy at that time.  Intermittent constipation. Taking Align, Benefiber when needed. Stools mostly every day. Stools not hard. No melena, brbpr. Some vague abdominal pain resolves with belching. Lots of stomach gurgling. No heartburn on Nexium. No dysphagia. No odynophagia. Has been on Aleve off and on, but none since June. No other NSAIDs. Under a lot of stress taking care of husband with dementia.     Current Outpatient Prescriptions  Medication Sig Dispense Refill  . atenolol (TENORMIN) 25 MG tablet Take 25 mg by mouth daily.       [name] Peroxide (EAR DROPS OT) Place in ear(s) as needed.      . cetirizine (ZYRTEC) 10 MG tablet Take 10 mg by mouth daily.      Marland Kitchen CREON 24000 UNITS CPEP 24,000 Units. 2 w/ meals, 1 w/ snacks      . cycloSPORINE (RESTASIS) 0.05 % ophthalmic emulsion Place 1 drop into both eyes 3 (three) times daily.       Marland Kitchen docusate sodium (COLACE) 100 MG capsule Take 100 mg by mouth 2 (two) times daily as needed.       . enalapril (VASOTEC) 20 MG tablet Take 10 mg by mouth 2 (two) times daily. Takes 10mg  at lunch and 20mg  at night.      . esomeprazole (NEXIUM) 40 MG capsule Take 40 mg by mouth  daily before breakfast.      . EVISTA 60 MG tablet Take 60 mg by mouth daily.       . fluticasone (VERAMYST) 27.5 MCG/SPRAY nasal spray Place 2 sprays into the nose daily.      Marland Kitchen HUMULIN N 100 UNIT/ML injection Inject 42 Units into the skin 2 (two) times daily. 42 units in am  8 units in pm       . hydrochlorothiazide (HYDRODIURIL) 25 MG tablet Take 25 mg by mouth daily.       Marland Kitchen LORazepam (ATIVAN) 0.5 MG tablet Take 0.5 mg by mouth every 8 (eight) hours.      . lovastatin (MEVACOR) 20 MG tablet Take 20 mg by mouth at bedtime.       . metFORMIN (GLUCOPHAGE-XR) 500 MG 24 hr tablet Take 500 mg by mouth daily with breakfast.       . Multiple Vitamin (MULTIVITAMIN) capsule Take 1 capsule by mouth daily.      . nitrofurantoin (MACRODANTIN) 50 MG capsule Take 50 mg by mouth at bedtime.      . promethazine (PHENERGAN) 25 MG tablet Take 25 mg by mouth every 6 (six) hours as needed.      . zolpidem (AMBIEN) 10 MG tablet Take 10 mg by mouth at bedtime as needed.  Allergies as of 12/17/2011 - Review Complete 12/17/2011  Allergen Reaction Noted  . Calcium-containing compounds Nausea Only 08/21/2011  . Vitamin d analogs Nausea Only 08/21/2011    Past Medical History  Diagnosis Date  . Hypertension   . History of carcinoma in situ of breast 1988  . Acute biliary pancreatitis 07/2002    thia was in 07/2004:she still has pseudocyst in tail of pancreas measuring 54 x 33 mm   . Upper GI bleed September2004    secondary to gastritis  . Diabetes mellitus   . Adrenal adenoma     bilateral  . Detached retina   . Chronic pancreatitis   . Hyperlipidemia   . Osteopenia   . IBS (irritable bowel syndrome)   . Legally blind   . Anxiety   . Osteoarthritis     Past Surgical History  Procedure Date  . Colonoscopy 08/15/05    few tiny diverticula at sigmoid colon/external hemorrhoids but no polyps  . Right mastectomy   . Ectopic pregnancy surgery 1950's  . Tacking up of her bladder   .  Laparoscopic cholecystectomy   . Ercp with sphincterotomy 07/2002  . Pancreatic pseudocyst drainage June2004    drained percutaneously   . Esophagogastroduodenoscopy      Gastritis of body.  Otherwise normal    Family History  Problem Relation Age of Onset  . Colon cancer Father 36    passed away in his 60's  . Colon cancer Brother 50    surgery inhis 50's doing well now  . Heart attack Brother     passed away age 19  . Pancreatitis Brother   . Ovarian cancer Mother     passed  . Kidney disease Daughter   . Leukemia Son     History   Social History  . Marital Status: Married    Spouse Name: N/A    Number of Children: 5  . Years of Education: N/A   Occupational History  . homemaker    Social History Main Topics  . Smoking status: Never Smoker   . Smokeless tobacco: Not on file  . Alcohol Use: No  . Drug Use: No  . Sexually Active: Not on file   Other Topics Concern  . Not on file   Social History Narrative  . No narrative on file      ROS:  General: Negative for anorexia, weight loss, fever, chills, fatigue, weakness. Eyes: Negative for vision changes.  ENT: Negative for hoarseness, difficulty swallowing , nasal congestion. CV: Negative for chest pain, angina, palpitations, dyspnea on exertion, peripheral edema.  Respiratory: Negative for dyspnea at rest, dyspnea on exertion, cough, sputum, wheezing.  GI: See history of present illness. GU:  Negative for dysuria, hematuria, urinary incontinence, urinary frequency, nocturnal urination.  MS: Negative for joint pain, low back pain.  Derm: Negative for rash or itching.  Neuro: Negative for weakness, abnormal sensation, seizure, frequent headaches, memory loss, confusion.  Psych: Negative for anxiety, depression, suicidal ideation, hallucinations.  Endo: Negative for unusual weight change.  Heme: Negative for bruising or bleeding. Allergy: Negative for rash or hives.    Physical Examination:  BP 152/69   Pulse 66  Temp 98.1 F (36.7 C) (Temporal)  Ht 5\' 2"  (1.575 m)  Wt 142 lb (64.411 kg)  BMI 25.97 kg/m2   General: Well-nourished, well-developed in no acute distress. Accompanied by dgt. Head: Normocephalic, atraumatic.   Eyes: Conjunctiva pink, no icterus. Mouth: Oropharyngeal mucosa moist and pink , no lesions  erythema or exudate. Neck: Supple without thyromegaly, masses, or lymphadenopathy.  Lungs: Clear to auscultation bilaterally.  Heart: Regular rate and rhythm, no murmurs rubs or gallops.  Abdomen: Bowel sounds are normal, nontender, nondistended, no hepatosplenomegaly or masses, no abdominal bruits or    hernia , no rebound or guarding.   Rectal: not performed Extremities: No lower extremity edema. No clubbing or deformities.  Neuro: Alert and oriented x 4 , grossly normal neurologically.  Skin: Warm and dry, no rash or jaundice.   Psych: Alert and cooperative, normal mood and affect.  Labs: Labs from 10/16/2011, total bilirubin 0.5, alkaline phosphatase 33, AST 18, ALT 15, albumin 4.5, sodium 136, potassium 4.5, calcium 9.7, creatinine 0.96, BUN 15, glucose 129, total cholesterol 112, he may 166.6, white blood cell count 4400, hemoglobin 11.3, hematocrit 33.9, MCV 83.8, platelets 116,000.  Imaging Studies: No results found.

## 2012-01-08 NOTE — Interval H&P Note (Signed)
History and Physical Interval Note:  01/08/2012 8:57 AM  Misty Edwards  has presented today for surgery, with the diagnosis of ABDOMINAL PAIN, HISTORY OF POLYPS , ANEMIA AND FAMILY HISTORY OF COLON RECTAL CANCER  The various methods of treatment have been discussed with the patient and family. After consideration of risks, benefits and other options for treatment, the patient has consented to  Procedure(s) (LRB) with comments: COLONOSCOPY WITH ESOPHAGOGASTRODUODENOSCOPY (EGD) (N/A) - 8:45/GIVE PHENERGAN 12.5 MG IV PRIOR TO PROCEDURE as a surgical intervention .  The patient's history has been reviewed, patient examined, no change in status, stable for surgery.  I have reviewed the patient's chart and labs.  Questions were answered to the patient's satisfaction.     Eula Listen

## 2012-01-08 NOTE — Op Note (Signed)
[facility] ([facility]) [address] [place], [address]   COLONOSCOPY PROCEDURE REPORT  PATIENT: Misty Edwards, Misty Edwards  MR#:         [id number] BIRTHDATE: [birth year] , 85  yrs. old GENDER: Female ENDOSCOPIST: R.  Roetta Sessions, MD FACP FACG REFERRED BY:  Gareth Morgan, M.D. PROCEDURE DATE:  01/08/2012 PROCEDURE:     Colonoscopy-diagnostic  INDICATIONS: Personal history of colonic polyps; positive family history of colon cancer. Anemia  INFORMED CONSENT:  The risks, benefits, alternatives and imponderables including but not limited to bleeding, perforation as well as the possibility of a missed lesion have been reviewed.  The potential for biopsy, lesion removal, etc. have also been discussed.  Questions have been answered.  All parties agreeable. Please see the history and physical in the medical record for more information.  MEDICATIONS: Versed 6 mg IV and Demerol 100 mg IV in divided doses. Phenergan 12.5 mg IV  DESCRIPTION OF PROCEDURE:  After a digital rectal exam was performed, the     colonoscope was advanced from the anus through the rectum and colon to the area of the cecum, ileocecal valve and appendiceal orifice.  The cecum was deeply intubated.  These structures were well-seen and photographed for the record.  From the level of the cecum and ileocecal valve, the scope was slowly and cautiously withdrawn.  The mucosal surfaces were carefully surveyed utilizing scope tip deflection to facilitate fold flattening as needed.  The scope was pulled down into the rectum where a thorough examination including retroflexion was performed.     FINDINGS:  Adequate preparation. Internal hemorrhoids; otherwise normal rectum. Few, scattered left-sided diverticula; the remainder of the colonic mucosa appeared normal.  THERAPEUTIC / DIAGNOSTIC MANEUVERS PERFORMED:  none  COMPLICATIONS: none  CECAL WITHDRAWAL TIME:  11 minutes  IMPRESSION:  Colonic  diverticulosis  RECOMMENDATIONS: Consider one more colonoscopy in 5 years for high risk screening purposes only if patient's overall health permits   _______________________________ eSigned:  R. Roetta Sessions, MD FACP [facility] 01/08/2012 9:56 AM   CC:

## 2012-01-08 NOTE — Op Note (Signed)
[facility] [address] [place], [address]   ENDOSCOPY PROCEDURE REPORT  PATIENT: Misty Edwards, Misty Edwards  MR#: [id number] BIRTHDATE: January 04, 1927 , 85  yrs. old GENDER: Female ENDOSCOPIST: R.  Roetta Sessions, MD FACP FACG REFERRED BY:  Gareth Morgan, M.D. PROCEDURE DATE:  01/08/2012 PROCEDURE:     EGD with gastric biopsy  INDICATIONS:     Abdominal pain; anemia  INFORMED CONSENT:   The risks, benefits, limitations, alternatives and imponderables have been discussed.  The potential for biopsy, esophogeal dilation, etc. have also been reviewed.  Questions have been answered.  All parties agreeable.  Please see the history and physical in the medical record for more information.  MEDICATIONS:      Versed 3 mg IV and Demerol 75 mg IV in divided doses; Cetacaine spray; Phenergan 12.5 mg IV  DESCRIPTION OF PROCEDURE:   The     endoscope was introduced through the mouth and advanced to the second portion of the duodenum without difficulty or limitations.  The mucosal surfaces were surveyed very carefully during advancement of the scope and upon withdrawal.  Retroflexion view of the proximal stomach and esophagogastric junction was performed.      FINDINGS: Normal tubular esophagus;  normal esophageal mucosa. Stomach empty. Fine, few scattered gastric erosions; otherwise, no ulcer or infiltrating process. Pylorus patent. Normal first and second portion of the duodenum  THERAPEUTIC / DIAGNOSTIC MANEUVERS PERFORMED:  biopsies of the gastric mucosa taken   COMPLICATIONS:  None  IMPRESSION:  Few scattered gastric erosions of uncertain significance-status post biopsy  RECOMMENDATIONS:  Followup on pathology report; see colonoscopy report    _______________________________ R. Roetta Sessions, MD FACP [facility] eSigned:  R. Roetta Sessions, MD FACP [facility] 01/08/2012 9:28 AM     CC:

## 2012-01-12 ENCOUNTER — Encounter: Payer: Self-pay | Admitting: Internal Medicine

## 2012-01-13 ENCOUNTER — Encounter: Payer: Self-pay | Admitting: *Deleted

## 2012-01-14 ENCOUNTER — Encounter (HOSPITAL_COMMUNITY): Payer: Self-pay | Admitting: Internal Medicine

## 2012-01-21 ENCOUNTER — Telehealth: Payer: Self-pay | Admitting: Internal Medicine

## 2012-01-21 NOTE — Telephone Encounter (Signed)
Nausea was not mentioned at ov. Procedures were done for mild anemia. U/S was not for symptoms, but was to be considered for cause of low platelets and anemia.   I think we should offer her OV so we can determine if she needs ct instead.

## 2012-01-21 NOTE — Telephone Encounter (Signed)
Per [facility] ov note, pt may need abd U/S depending on tcs/egd results. Do you want pt scheduled for U/S?

## 2012-01-21 NOTE — Telephone Encounter (Signed)
Daughter, Edmon Crape, called regarding patient. She said that she had EGD/TCS on 10/24 and is still sick, nauseated and weak. Phenergan is not helping. Please advise. Also, daughter said that patient was told to follow up in 3 months but was under the impression that the patient was going to be set up for an Ultra Sound. I told her that I would check on that when LAW returned from lunch. Daughter can be reached at [phone]

## 2012-01-22 NOTE — Telephone Encounter (Signed)
Susan, please schedule pt ov. Thanks. 

## 2012-01-22 NOTE — Telephone Encounter (Signed)
LMOM for daughter to call me back to set up OV with [facility] on Monday

## 2012-01-26 NOTE — Telephone Encounter (Signed)
I tried calling daughter back this morning from where she had LMOM after hours on Friday. Phone was busy

## 2012-01-30 ENCOUNTER — Encounter: Payer: Self-pay | Admitting: Internal Medicine

## 2012-02-02 ENCOUNTER — Encounter: Payer: Self-pay | Admitting: Gastroenterology

## 2012-02-02 ENCOUNTER — Ambulatory Visit (INDEPENDENT_AMBULATORY_CARE_PROVIDER_SITE_OTHER): Payer: Medicare Other | Admitting: Gastroenterology

## 2012-02-02 VITALS — BP 158/74 | HR 69 | Temp 97.6°F | Ht 62.0 in | Wt 139.2 lb

## 2012-02-02 DIAGNOSIS — K861 Other chronic pancreatitis: Secondary | ICD-10-CM

## 2012-02-02 DIAGNOSIS — R110 Nausea: Secondary | ICD-10-CM

## 2012-02-02 DIAGNOSIS — D696 Thrombocytopenia, unspecified: Secondary | ICD-10-CM | POA: Insufficient documentation

## 2012-02-02 DIAGNOSIS — R1032 Left lower quadrant pain: Secondary | ICD-10-CM

## 2012-02-02 DIAGNOSIS — K2970 Gastritis, unspecified, without bleeding: Secondary | ICD-10-CM | POA: Insufficient documentation

## 2012-02-02 DIAGNOSIS — D649 Anemia, unspecified: Secondary | ICD-10-CM

## 2012-02-02 DIAGNOSIS — R1013 Epigastric pain: Secondary | ICD-10-CM | POA: Insufficient documentation

## 2012-02-02 LAB — LIPASE: Lipase: <10 U/L (ref 0–75)

## 2012-02-02 LAB — COMPREHENSIVE METABOLIC PANEL
Alkaline Phosphatase: 25 U/L — ABNORMAL LOW (ref 39–117)
CO2: 27 mEq/L (ref 19–32)
Creat: 0.82 mg/dL (ref 0.50–1.10)
Glucose, Bld: 69 mg/dL — ABNORMAL LOW (ref 70–99)
Sodium: 134 mEq/L — ABNORMAL LOW (ref 135–145)
Total Bilirubin: 0.5 mg/dL (ref 0.3–1.2)
Total Protein: 6.8 g/dL (ref 6.0–8.3)

## 2012-02-02 LAB — CBC WITH DIFFERENTIAL/PLATELET
Basophils Relative: 0 % (ref 0–1)
Eosinophils Absolute: 0.1 10*3/uL (ref 0.0–0.7)
Eosinophils Relative: 1 % (ref 0–5)
Lymphs Abs: 2.1 10*3/uL (ref 0.7–4.0)
MCH: 27.6 pg (ref 26.0–34.0)
MCHC: 33.8 g/dL (ref 30.0–36.0)
MCV: 81.7 fL (ref 78.0–100.0)
Monocytes Relative: 6 % (ref 3–12)
Neutrophils Relative %: 63 % (ref 43–77)
Platelets: 156 10*3/uL (ref 150–400)

## 2012-02-02 LAB — HEMOGLOBIN A1C
Hgb A1c MFr Bld: 6.3 % — ABNORMAL HIGH (ref <5.7)
Mean Plasma Glucose: 134 mg/dL — ABNORMAL HIGH (ref <117)

## 2012-02-02 MED ORDER — ESOMEPRAZOLE MAGNESIUM 40 MG PO CPDR: 40.0000 mg | DELAYED_RELEASE_CAPSULE | Freq: Two times a day (BID) | ORAL | Status: DC

## 2012-02-02 NOTE — Progress Notes (Signed)
Primary Care Physician: Milana Obey, MD  Primary Gastroenterologist:  Roetta Sessions, MD   Chief Complaint  Patient presents with  . Follow-up    HPI: Misty Edwards is a 78 y.o. female here for f/u. Last seen in the office on 12/17/11 for evaluation of anemia. She has h/o chronic pancreatitis, pancreatic pseudocyst. She has mild thrombocytopenia without history of liver disease. EGD/TCS done 01/08/2012 by Dr. Jena Gauss. She had colonic diverticulosis. Few scattered gastric erosions, biopsies unremarkable.  Since two days after colonoscopy/EGD she has been very nauseated. No vomiting. Eating basically liquids/full liquids/egg. Has been having LLQ pain going into vagina. No melena, brbpr. BM unchanged, usually go once per day. Belching a lot. Taking probiotic 3-4 times per week. No heartburn. Down few pounds. Take metformin irregularly based on her fasting glucose in the mornings. Has not felt well for several weeks.    Current Outpatient Prescriptions  Medication Sig Dispense Refill  . acetaminophen (TYLENOL) 500 MG tablet Take 500-1,000 mg by mouth 2 (two) times daily as needed. Pain.      Marland Kitchen atenolol (TENORMIN) 25 MG tablet Take 25 mg by mouth daily.       . Carbamide Peroxide (EAR DROPS OT) Place 2 drops in ear(s) daily as needed. Ear pain.      . cetirizine (ZYRTEC) 10 MG tablet Take 10 mg by mouth daily.      Marland Kitchen CREON 24000 UNITS CPEP 24,000 Units. 2 w/ meals, 1 w/ snacks      . cycloSPORINE (RESTASIS) 0.05 % ophthalmic emulsion Place 1 drop into both eyes 3 (three) times daily.       Marland Kitchen docusate sodium (COLACE) 100 MG capsule Take 100 mg by mouth 2 (two) times daily as needed. Constipation.      . enalapril (VASOTEC) 20 MG tablet Take 5-10 mg by mouth 2 (two) times daily. 5 mg at lunch time and 10 mg at bedtime.      Marland Kitchen esomeprazole (NEXIUM) 40 MG capsule Take 40 mg by mouth daily before breakfast.      . EVISTA 60 MG tablet Take 60 mg by mouth daily.       Marland Kitchen HUMULIN N 100 UNIT/ML  injection Inject 8-42 Units into the skin 2 (two) times daily. 42 units in am  8 units in pm       . hydrochlorothiazide (HYDRODIURIL) 25 MG tablet Take 25 mg by mouth daily.       Marland Kitchen LORazepam (ATIVAN) 0.5 MG tablet Take 0.5 mg by mouth every 8 (eight) hours.      . lovastatin (MEVACOR) 20 MG tablet Take 20 mg by mouth at bedtime.       . metFORMIN (GLUCOPHAGE-XR) 500 MG 24 hr tablet Take 500 mg by mouth daily with breakfast.       . Multiple Vitamin (MULTIVITAMIN) capsule Take 1 capsule by mouth daily.      . promethazine (PHENERGAN) 25 MG tablet Take 25 mg by mouth every 6 (six) hours as needed. Nausea.      Marland Kitchen zolpidem (AMBIEN) 10 MG tablet Take 10 mg by mouth at bedtime as needed. Sleep.        Allergies as of 02/02/2012 - Review Complete 02/02/2012  Allergen Reaction Noted  . Calcium-containing compounds Nausea Only 08/21/2011  . Vitamin d analogs Nausea Only 08/21/2011    ROS:  General: Negative for fever, chills, fatigue, weakness. See HPI. ENT: Negative for hoarseness, difficulty swallowing , nasal congestion. CV: Negative for chest pain, angina,  palpitations, dyspnea on exertion, peripheral edema.  Respiratory: Negative for dyspnea at rest, dyspnea on exertion, cough, sputum, wheezing.  GI: See history of present illness. GU:  Negative for dysuria, hematuria, urinary incontinence, urinary frequency, nocturnal urination.  Endo: Negative for unusual weight change.    Physical Examination:   BP 158/74  Pulse 69  Temp 97.6 F (36.4 C) (Temporal)  Ht 5\' 2"  (1.575 m)  Wt 139 lb 3.2 oz (63.141 kg)  BMI 25.46 kg/m2  General: Well-nourished, well-developed in no acute distress. Accompanied by daughter.  Eyes: No icterus. Mouth: Oropharyngeal mucosa moist and pink , no lesions erythema or exudate. Lungs: Clear to auscultation bilaterally.  Heart: Regular rate and rhythm, no murmurs rubs or gallops.  Abdomen: Bowel sounds are normal, mild epigastric tenderness and fullness,  nondistended, no hepatosplenomegaly or masses, no abdominal bruits or hernia , no rebound or guarding.   Extremities: No lower extremity edema. No clubbing or deformities. Neuro: Alert and oriented x 4   Skin: Warm and dry, no jaundice.   Psych: Alert and cooperative, normal mood and affect.

## 2012-02-02 NOTE — Assessment & Plan Note (Signed)
Several week h/o nausea without vomiting, loss of appetite, LLQ pain into groin, epigastric tenderness on exam. Symptoms began two days after her recent TCS/EGD. She had colonic diverticulosis, benign gastritis. No NSAIDS/ASA. No H.Pylori. She also needs further w/u of thrombocytopenia. Given her extensive complicated pancreatic history she will need further evaluation.  Check labs. CT A/P with contrast. Gastritis handout provided. Increase Nexium 40mg  BID for four weeks and then go back to once daily. Samples provided.

## 2012-02-02 NOTE — Progress Notes (Signed)
REVIEWED.  

## 2012-02-02 NOTE — Patient Instructions (Addendum)
Please have your bloodwork done.  Please have your CT scan done as scheduled.  Increase Nexium to twice daily. Take one capsule before breakfast and 1 before your evening meal. We have provided you with samples. After 30 days you may go back to once daily.  Gastritis, Adult Gastritis is soreness and puffiness (inflammation) of the lining of the stomach. If you do not get help, gastritis can cause bleeding and sores (ulcers) in the stomach. HOME CARE   Only take medicine as told by your doctor.  If you were given antibiotic medicines, take them as told. Finish the medicines even if you start to feel better.  Drink enough fluids to keep your pee (urine) clear or pale yellow.  Avoid foods and drinks that make your problems worse. Foods you may want to avoid include:  Caffeine or alcohol.  Chocolate.  Mint.  Garlic and onions.  Spicy foods.  Citrus fruits, including oranges, lemons, or limes.  Food containing tomatoes, including sauce, chili, salsa, and pizza.  Fried and fatty foods.  Eat small meals throughout the day instead of large meals. GET HELP RIGHT AWAY IF:   You have black or dark red poop (stools).  You throw up (vomit) blood. It may look like coffee grounds.  You cannot keep fluids down.  Your belly (abdominal) pain gets worse.  You have a fever.  You do not feel better after 1 week.  You have any other questions or concerns. MAKE SURE YOU:   Understand these instructions.  Will watch your condition.  Will get help right away if you are not doing well or get worse. Document Released: 08/20/2007 Document Revised: 05/26/2011 Document Reviewed: 04/16/2011 [facility] Patient Information 2013 [place], [place].

## 2012-02-02 NOTE — Progress Notes (Signed)
Faxed to PCP

## 2012-02-04 ENCOUNTER — Ambulatory Visit (HOSPITAL_COMMUNITY)
Admission: RE | Admit: 2012-02-04 | Discharge: 2012-02-04 | Disposition: A | Discharge status: Home / Self Care | Payer: Medicare Other | Source: Ambulatory Visit | Attending: Gastroenterology | Admitting: Gastroenterology

## 2012-02-04 DIAGNOSIS — K861 Other chronic pancreatitis: Secondary | ICD-10-CM

## 2012-02-04 DIAGNOSIS — R1013 Epigastric pain: Secondary | ICD-10-CM | POA: Insufficient documentation

## 2012-02-04 DIAGNOSIS — R1032 Left lower quadrant pain: Secondary | ICD-10-CM | POA: Insufficient documentation

## 2012-02-04 DIAGNOSIS — K2970 Gastritis, unspecified, without bleeding: Secondary | ICD-10-CM

## 2012-02-04 DIAGNOSIS — R110 Nausea: Secondary | ICD-10-CM

## 2012-02-04 DIAGNOSIS — D649 Anemia, unspecified: Secondary | ICD-10-CM

## 2012-02-04 DIAGNOSIS — D696 Thrombocytopenia, unspecified: Secondary | ICD-10-CM

## 2012-02-04 DIAGNOSIS — N200 Calculus of kidney: Secondary | ICD-10-CM | POA: Insufficient documentation

## 2012-02-04 MED ORDER — IOHEXOL 300 MG/ML  SOLN: 100.0000 mL | Freq: Once | INTRAMUSCULAR | Status: AC | PRN

## 2012-02-04 MED ORDER — IOHEXOL 300 MG/ML  SOLN
100.0000 mL | Freq: Once | INTRAMUSCULAR | Status: AC | PRN
  Administered 2012-02-04: 100 mL via INTRAVENOUS

## 2012-02-04 NOTE — Progress Notes (Signed)
Quick Note:  Stable anemia. Platelets now normal.  Await CT.  Send copy of labs to Dr. Sudie Bailey. ______

## 2012-02-06 NOTE — Progress Notes (Signed)
Quick Note:  Nothing on CT to explain nausea or LLQ pain. Pancreas with some atrophy but otherwise ok. She has atherosclerosis involving aortiiliac and coronary artery disease (by CT).   Send copy of CT to Dr. Sudie Bailey and have patient f/u with him about the atherosclerosis and CAD.  I will discuss further with Dr. Jena Gauss regarding patient's symptoms. ______

## 2012-02-06 NOTE — Progress Notes (Signed)
Quick Note:  Please let patient know: Discussed with Dr. Jena Gauss. He recommends 10 days of low-dose reglan for nausea (5mg  qac and qhs (no more than four times daily). Call in 10 days supply. Warn of potential side effects of tardive dyskinesia. Unlikely for short-term use. Add probiotic daily. [facility] colon health or align.   Call with PR in 2 weeks or sooner if needed. ______

## 2012-02-09 NOTE — Progress Notes (Signed)
Results faxed to Dr Sudie Bailey

## 2012-02-09 NOTE — Progress Notes (Signed)
Quick Note:  pts daughter is aware of results and recommendations. Warned of side effects of medication. She will talk to pt and explain this to her and if she doesn't want to take the medication they will call back. rx called to [facility]. They will call in 2 weeks with PR.   Dawn, please cc Dr. Sudie Bailey. ______

## 2012-03-04 ENCOUNTER — Encounter: Payer: Self-pay | Admitting: *Deleted

## 2012-03-18 ENCOUNTER — Telehealth: Payer: Self-pay | Admitting: *Deleted

## 2012-03-18 MED ORDER — METOCLOPRAMIDE HCL 5 MG PO TABS: 5.0000 mg | ORAL_TABLET | Freq: Four times a day (QID) | ORAL | Status: DC

## 2012-03-18 NOTE — Telephone Encounter (Signed)
Pt was given 10 day course of reglan from [facility] per note on CT. Is it ok to give rx for more?

## 2012-03-18 NOTE — Addendum Note (Signed)
Addended by: Myra Rude on: 03/18/2012 03:51 PM   Modules accepted: Orders

## 2012-03-18 NOTE — Telephone Encounter (Signed)
Okay may have refill of the ten-day course of the 5 mg Reglan and 1 additional refill on top of that. I discussed with Hendricks Limes, LPN

## 2012-03-18 NOTE — Telephone Encounter (Signed)
rx sent to [facility]. Pt has appt 04/12/12 with AS.  pts daughter is aware.

## 2012-03-18 NOTE — Telephone Encounter (Signed)
Ms Misty Edwards called today in regards to her mother, Ms Misty Edwards. She is still experiencing an upset stomach and has no appetitive. Her daughter would like to know if she can get another 10 day supply of a med that was called in recently for the same thing.  Please call her back. Thanks.

## 2012-04-12 ENCOUNTER — Ambulatory Visit (INDEPENDENT_AMBULATORY_CARE_PROVIDER_SITE_OTHER): Payer: Medicare Other | Admitting: Gastroenterology

## 2012-04-12 ENCOUNTER — Encounter: Payer: Self-pay | Admitting: Gastroenterology

## 2012-04-12 VITALS — BP 154/70 | HR 75 | Temp 97.4°F | Ht 62.0 in | Wt 137.6 lb

## 2012-04-12 DIAGNOSIS — R110 Nausea: Secondary | ICD-10-CM

## 2012-04-12 DIAGNOSIS — Z86010 Personal history of colonic polyps: Secondary | ICD-10-CM

## 2012-04-12 MED ORDER — METOCLOPRAMIDE HCL 5 MG PO TABS: 5.0000 mg | ORAL_TABLET | Freq: Four times a day (QID) | ORAL | Status: DC

## 2012-04-12 NOTE — Progress Notes (Signed)
Referring Provider: Milana Obey, MD Primary Care Physician:  Milana Obey, MD Primary Gastroenterologist: Dr. Jena Gauss   Chief Complaint  Patient presents with  . Follow-up    HPI:   79 year old female presents today for follow-up, with history of chronic pancreatitis, pancreatic pseudocyst, mild thrombocytopenia without evidence of liver disease. Most recent CBC with normalized PLTs.  Last visit was in Nov 2013, where a CT was ordered secondary to abdominal pain, nausea. CT without etiology for pain. A short dose of Reglan was provided, probiotic daily recommended.  Wakes up in the morning very sick to her stomach, then takes the Reglan and it goes away. Taking 4 times per day. Nexium BID. +belching/gas. Good appetite. She has had several refills of Reglan, with dispensed amount only for a 10 day course.   2010: 135 Oct 2013: 142 Nov 2013: 139 Today: 137 Past Medical History  Diagnosis Date  . Hypertension   . History of carcinoma in situ of breast 1988  . Acute biliary pancreatitis 07/2002    thia was in 07/2004:she still has pseudocyst in tail of pancreas measuring 54 x 33 mm   . Upper GI bleed September2004    secondary to gastritis  . Diabetes mellitus   . Adrenal adenoma     bilateral  . Detached retina   . Chronic pancreatitis   . Hyperlipidemia   . Osteopenia   . IBS (irritable bowel syndrome)   . Legally blind   . Anxiety   . Osteoarthritis     Past Surgical History  Procedure Date  . Colonoscopy 08/15/05    few tiny diverticula at sigmoid colon/external hemorrhoids but no polyps  . Right mastectomy   . Ectopic pregnancy surgery 1950's  . Tacking up of her bladder   . Laparoscopic cholecystectomy   . Ercp with sphincterotomy 07/2002  . Pancreatic pseudocyst drainage June2004    drained percutaneously   . Esophagogastroduodenoscopy      Gastritis of body.  Otherwise normal  . Colonoscopy 01/08/2012    NWG:[id number] diverticulosis. Next colonoscopy in  12/2016  . Esophagogastroduodenoscopy 01/08/2012    RMR: Few scattered gastric erosions of uncertain significance-status post biopsy. Minimal chronic inflammation, no H.pylori    Current Outpatient Prescriptions  Medication Sig Dispense Refill  . acetaminophen (TYLENOL) 500 MG tablet Take 500-1,000 mg by mouth 2 (two) times daily as needed. Pain.      Marland Kitchen atenolol (TENORMIN) 25 MG tablet Take 25 mg by mouth daily.       . Carbamide Peroxide (EAR DROPS OT) Place 2 drops in ear(s) daily as needed. Ear pain.      . cetirizine (ZYRTEC) 10 MG tablet Take 10 mg by mouth daily.      . ciprofloxacin (CIPRO) 500 MG tablet Take 500 mg by mouth 2 (two) times daily.       Marland Kitchen CREON 24000 UNITS CPEP 24,000 Units. 2 w/ meals, 1 w/ snacks      . cycloSPORINE (RESTASIS) 0.05 % ophthalmic emulsion Place 1 drop into both eyes 3 (three) times daily.       Marland Kitchen docusate sodium (COLACE) 100 MG capsule Take 100 mg by mouth 2 (two) times daily as needed. Constipation.      . enalapril (VASOTEC) 20 MG tablet Take 5-10 mg by mouth 2 (two) times daily. 5 mg at lunch time and 10 mg at bedtime.      Marland Kitchen esomeprazole (NEXIUM) 40 MG capsule Take 1 capsule (40 mg total) by mouth 2 (two)  times daily before a meal. Increase to twice daily for 30 days, then go back to once daily.  30 capsule  0  . EVISTA 60 MG tablet Take 60 mg by mouth daily.       Marland Kitchen HUMULIN N 100 UNIT/ML injection Inject 8-42 Units into the skin 2 (two) times daily. 42 units in am  8 units in pm       . hydrochlorothiazide (HYDRODIURIL) 25 MG tablet Take 25 mg by mouth daily.       Marland Kitchen LORazepam (ATIVAN) 0.5 MG tablet Take 0.5 mg by mouth every 8 (eight) hours.      . lovastatin (MEVACOR) 20 MG tablet Take 20 mg by mouth at bedtime.       . metFORMIN (GLUCOPHAGE-XR) 500 MG 24 hr tablet Take 500 mg by mouth daily with breakfast.       . metoCLOPramide (REGLAN) 5 MG tablet Take 1 tablet (5 mg total) by mouth 4 (four) times daily.  40 tablet  1  . Multiple Vitamin  (MULTIVITAMIN) capsule Take 1 capsule by mouth daily.      . promethazine (PHENERGAN) 25 MG tablet Take 25 mg by mouth every 6 (six) hours as needed. Nausea.      Marland Kitchen zolpidem (AMBIEN) 10 MG tablet Take 10 mg by mouth at bedtime as needed. Sleep.        Allergies as of 04/12/2012 - Review Complete 04/12/2012  Allergen Reaction Noted  . Calcium-containing compounds Nausea Only 08/21/2011  . Vitamin d analogs Nausea Only 08/21/2011    Family History  Problem Relation Age of Onset  . Colon cancer Father 30    passed away in his 87's  . Colon cancer Brother 50    surgery inhis 50's doing well now  . Heart attack Brother     passed away age 36  . Pancreatitis Brother   . Ovarian cancer Mother     passed  . Kidney disease Daughter   . Leukemia Son     History   Social History  . Marital Status: Married    Spouse Name: N/A    Number of Children: 5  . Years of Education: N/A   Occupational History  . homemaker    Social History Main Topics  . Smoking status: Never Smoker   . Smokeless tobacco: None  . Alcohol Use: No  . Drug Use: No  . Sexually Active: None   Other Topics Concern  . None   Social History Narrative  . None    Review of Systems: Negative unless mentioned in HPI.   Physical Exam: BP 154/70  Pulse 75  Temp 97.4 F (36.3 C) (Oral)  Ht 5\' 2"  (1.575 m)  Wt 137 lb 9.6 oz (62.415 kg)  BMI 25.17 kg/m2 General:   Alert and oriented. No distress noted. Pleasant and cooperative.  Head:  Normocephalic and atraumatic. Eyes:  Conjuctiva clear without scleral icterus. Mouth:  Oral mucosa pink and moist. Good dentition. No lesions. Heart:  S1, S2 present without murmurs, rubs, or gallops. Regular rate and rhythm. Abdomen:  +BS, soft, non-tender and non-distended. No rebound or guarding. No HSM or masses noted. Msk:  Symmetrical without gross deformities. Normal posture. Extremities:  Without edema. Neurologic:  Alert and  oriented x4;  grossly normal  neurologically. Skin:  Intact without significant lesions or rashes. Psych:  Alert and cooperative. Normal mood and affect.

## 2012-04-12 NOTE — Assessment & Plan Note (Signed)
Unclear etiology at this point; CT overall benign from Nov 2013. Question an element of diabetic gastroparesis. She has had great improvement with low-dose Reglan QID. She has completed 3 refills of a 10 day course; therefore, she ultimately has not taken continuously since November. She is asking for another refill. I have discussed the side effects of this medication in detail with the patient and her daughter. I would feel more comfortable decreasing her to twice a day dosing, then once a day, then weaning off. Another agent that may be helpful is Zofran. Will hold off on GES, as it will not change the plan of care. She has an updated EGD on file and TCS. Continue PPI BID and Creon as prescribed. Return in 3 months to see Dr. Jena Gauss.

## 2012-04-12 NOTE — Assessment & Plan Note (Signed)
Surveillance due Oct 2018.

## 2012-04-12 NOTE — Patient Instructions (Addendum)
I would like you to decrease the amount of Reglan you are taking. Decrease this to only at morning and night with meals. If you do well with this, decrease to just once per day.   Continue taking your reflux medication.  Review the "gas" diet. This may help with some of the symptoms you are having.  I have made a referral to see a nutritionist to help with meal planning.  We will see you back in 3 months or sooner if needed!

## 2012-07-05 ENCOUNTER — Encounter: Payer: Self-pay | Admitting: Internal Medicine

## 2012-07-12 ENCOUNTER — Ambulatory Visit (INDEPENDENT_AMBULATORY_CARE_PROVIDER_SITE_OTHER): Payer: Medicare Other | Admitting: Family Medicine

## 2012-07-12 ENCOUNTER — Encounter: Payer: Self-pay | Admitting: Family Medicine

## 2012-07-12 VITALS — BP 126/74 | HR 70 | Temp 97.9°F | Resp 16 | Wt 141.0 lb

## 2012-07-12 DIAGNOSIS — C50919 Malignant neoplasm of unspecified site of unspecified female breast: Secondary | ICD-10-CM

## 2012-07-12 DIAGNOSIS — E119 Type 2 diabetes mellitus without complications: Secondary | ICD-10-CM

## 2012-07-12 DIAGNOSIS — E785 Hyperlipidemia, unspecified: Secondary | ICD-10-CM

## 2012-07-12 DIAGNOSIS — K861 Other chronic pancreatitis: Secondary | ICD-10-CM

## 2012-07-12 DIAGNOSIS — I10 Essential (primary) hypertension: Secondary | ICD-10-CM

## 2012-07-12 NOTE — Progress Notes (Signed)
Subjective:    Patient ID: Misty Edwards, female    DOB: 12/10/1926, 79 y.o.   MRN: [id number]  HPI Date pleasant 79 year old female here to establish care. She is a very complicated past medical history. She has a history of acute biliary pancreatitis in 2006. This led to chronic pancreatitis and pancreatic insufficiency. She is currently taking Creon 2 tablets with meals, one tablet with snacks.    She has insulin-dependent diabetes mellitus. She currently takes Humulin N. 42 units in the morning and 8 units at night.  She is also on metformin 500 mg by mouth every morning. Her fasting blood sugars typically are less than 140. However she frequently has hypoglycemic episodes in the middle of the night which require her to get up and eat a snack.  She denies polyuria, polydipsia, or blurred vision.  She also has a history of breast cancer. She's underwent a right-sided mastectomy. She is overdue for a mammogram.  Regards to her general medical maintenance, she has had a Pneumovax. She is due for a zoster back. Her colonoscopy is up-to-date as it was done in 2013.    Chest has a history of hypertension. She is currently on atenolol 25 mg by mouth daily, enalapril 10 mg by mouth twice a day, and HCTZ 25 mg by mouth daily. She denies any chest pain, shortness of breath, or dyspnea on exertion. She does have low blood pressure at times. If this persists I would likely discontinue HCTZ.  She also has hyperlipidemia and is currently taking lovastatin 20 mg by mouth daily she is overdue for fasting lipid panel. Her goal LDL is less than 100. Of note she has a history of an upper GI bleed. Therefore she is not on aspirin for cardiovascular prophylaxis.   Past Medical History  Diagnosis Date  . Hypertension   . History of carcinoma in situ of breast 1988  . Acute biliary pancreatitis 07/2002    thia was in 07/2004:she still has pseudocyst in tail of pancreas measuring 54 x 33 mm   . Upper GI bleed  September2004    secondary to gastritis  . Diabetes mellitus   . Adrenal adenoma     bilateral  . Detached retina   . Chronic pancreatitis   . Hyperlipidemia   . Osteopenia   . IBS (irritable bowel syndrome)   . Legally blind   . Anxiety   . Osteoarthritis    Current Outpatient Prescriptions on File Prior to Visit  Medication Sig Dispense Refill  . acetaminophen (TYLENOL) 500 MG tablet Take 500-1,000 mg by mouth 2 (two) times daily as needed. Pain.      Marland Kitchen atenolol (TENORMIN) 25 MG tablet Take 25 mg by mouth daily.       . Carbamide Peroxide (EAR DROPS OT) Place 2 drops in ear(s) daily as needed. Ear pain.      . cetirizine (ZYRTEC) 10 MG tablet Take 10 mg by mouth daily.      Marland Kitchen CREON 24000 UNITS CPEP 24,000 Units. 2 w/ meals, 1 w/ snacks      . cycloSPORINE (RESTASIS) 0.05 % ophthalmic emulsion Place 1 drop into both eyes 3 (three) times daily.       Marland Kitchen docusate sodium (COLACE) 100 MG capsule Take 100 mg by mouth 2 (two) times daily as needed. Constipation.      . enalapril (VASOTEC) 20 MG tablet Take 5-10 mg by mouth 2 (two) times daily. 5 mg at lunch time and 10 mg  at bedtime.      Marland Kitchen esomeprazole (NEXIUM) 40 MG capsule Take 1 capsule (40 mg total) by mouth 2 (two) times daily before a meal. Increase to twice daily for 30 days, then go back to once daily.  30 capsule  0  . EVISTA 60 MG tablet Take 60 mg by mouth daily.       Marland Kitchen HUMULIN N 100 UNIT/ML injection Inject 8-42 Units into the skin 2 (two) times daily. 42 units in am  8 units in pm       . hydrochlorothiazide (HYDRODIURIL) 25 MG tablet Take 25 mg by mouth daily.       Marland Kitchen LORazepam (ATIVAN) 0.5 MG tablet Take 0.5 mg by mouth every 8 (eight) hours.      . lovastatin (MEVACOR) 20 MG tablet Take 20 mg by mouth at bedtime.       . metFORMIN (GLUCOPHAGE-XR) 500 MG 24 hr tablet Take 500 mg by mouth daily with breakfast.       . Multiple Vitamin (MULTIVITAMIN) capsule Take 1 capsule by mouth daily.      . promethazine (PHENERGAN) 25  MG tablet Take 25 mg by mouth every 6 (six) hours as needed. Nausea.      Marland Kitchen zolpidem (AMBIEN) 10 MG tablet Take 10 mg by mouth at bedtime as needed. Sleep.      . metoCLOPramide (REGLAN) 5 MG tablet Take 1 tablet (5 mg total) by mouth 4 (four) times daily.  40 tablet  1   No current facility-administered medications on file prior to visit.   Allergies  Allergen Reactions  . Calcium-Containing Compounds Nausea Only  . Vitamin D Analogs Nausea Only   History   Social History  . Marital Status: Married    Spouse Name: N/A    Number of Children: 5  . Years of Education: N/A   Occupational History  . homemaker    Social History Main Topics  . Smoking status: Never Smoker   . Smokeless tobacco: Not on file  . Alcohol Use: No  . Drug Use: No  . Sexually Active: Not on file   Other Topics Concern  . Not on file   Social History Narrative  . No narrative on file   Family History  Problem Relation Age of Onset  . Colon cancer Father 25    passed away in his 68's  . Colon cancer Brother 50    surgery inhis 50's doing well now  . Heart attack Brother     passed away age 35  . Pancreatitis Brother   . Ovarian cancer Mother     passed  . Kidney disease Daughter   . Leukemia Son      Review of Systems    review of systems is otherwise negative except for some right knee pain. Objective:   Physical Exam  Constitutional: She is oriented to person, place, and time. She appears well-developed and well-nourished.  HENT:  Head: Normocephalic.  Right Ear: External ear normal.  Left Ear: External ear normal.  Nose: Nose normal.  Mouth/Throat: Oropharynx is clear and moist. No oropharyngeal exudate.  Eyes: Conjunctivae are normal. Pupils are equal, round, and reactive to light.  Neck: Normal range of motion. Neck supple. No JVD present. No thyromegaly present.  Cardiovascular: Normal rate, regular rhythm, normal heart sounds and intact distal pulses.  Exam reveals no gallop  and no friction rub.   No murmur heard. Pulmonary/Chest: Effort normal and breath sounds normal. No respiratory  distress. She has no wheezes. She has no rales. She exhibits no tenderness.  Abdominal: Soft. Bowel sounds are normal. She exhibits no distension and no mass. There is no tenderness (dorsalis pedis two over four bilaterally). There is no rebound and no guarding.  Lymphadenopathy:    She has no cervical adenopathy.  Neurological: She is alert and oriented to person, place, and time. She has normal reflexes. She displays normal reflexes. No cranial nerve deficit. She exhibits normal muscle tone. Coordination normal.  Skin: Skin is warm and dry. No erythema.   sensation is intact to 10 mg monofilament bilaterally 4/4.         Assessment & Plan:   1. ADENOCARCINOMA, BREAST Recommended the patient schedule her mammogram. She states she will call when she gets home do that. If she needs to be glad to make a referral.  Also discussed with her the Zostavax just for general medical prevention. Structured call her insurance and return for the immunization if she thinks the cost is reasonable.  2. DM I am concerned by the hypoglycemic events at night, recommended she decrease Humulin N. to 4 units at night and continue 42 units in the morning. I will check a hemoglobin A1c. The goal is less than 7. Her last A1c was 6.3 when checked in November of 2013 - Basic Metabolic Panel; Future - CBC with Differential; Future - Hemoglobin A1c; Future - Hepatic Function Panel; Future - Lipid Panel; Future  3. HYPERTENSION Blood pressure is well controlled continue the same. - Basic Metabolic Panel; Future - CBC with Differential; Future - Hemoglobin A1c; Future - Hepatic Function Panel; Future - Lipid Panel; Future  4. Chronic pancreatitis Continue Creon 24000, 2 tablets with meals and one tablet with snacks.  5. HLD (hyperlipidemia) Return fasting for lipid panel. Her goal LDL is less than  100 given her diabetes. - Basic Metabolic Panel; Future - Hepatic Function Panel; Future - Lipid Panel; Future

## 2012-07-15 ENCOUNTER — Telehealth: Payer: Self-pay | Admitting: Family Medicine

## 2012-07-16 MED ORDER — CETIRIZINE HCL 10 MG PO TABS: 10.0000 mg | ORAL_TABLET | Freq: Every day | ORAL | Status: DC

## 2012-07-16 MED ORDER — ENALAPRIL MALEATE 20 MG PO TABS: 10.0000 mg | ORAL_TABLET | Freq: Two times a day (BID) | ORAL | Status: DC

## 2012-07-16 MED ORDER — PROMETHAZINE HCL 25 MG PO TABS: 25.0000 mg | ORAL_TABLET | Freq: Four times a day (QID) | ORAL | Status: DC | PRN

## 2012-07-16 MED ORDER — ESOMEPRAZOLE MAGNESIUM 40 MG PO CPDR: 40.0000 mg | DELAYED_RELEASE_CAPSULE | Freq: Two times a day (BID) | ORAL | Status: DC

## 2012-07-16 MED ORDER — HYDROCHLOROTHIAZIDE 25 MG PO TABS: 25.0000 mg | ORAL_TABLET | Freq: Every day | ORAL | Status: DC

## 2012-07-16 MED ORDER — LOVASTATIN 20 MG PO TABS: 20.0000 mg | ORAL_TABLET | Freq: Every day | ORAL | Status: DC

## 2012-07-16 MED ORDER — ATENOLOL 25 MG PO TABS: 25.0000 mg | ORAL_TABLET | Freq: Every day | ORAL | Status: DC

## 2012-07-16 MED ORDER — INSULIN NPH (HUMAN) (ISOPHANE) 100 UNIT/ML ~~LOC~~ SUSP: SUBCUTANEOUS | Status: DC

## 2012-07-16 MED ORDER — METOCLOPRAMIDE HCL 5 MG PO TABS: 5.0000 mg | ORAL_TABLET | Freq: Four times a day (QID) | ORAL | Status: DC

## 2012-07-16 MED ORDER — RALOXIFENE HCL 60 MG PO TABS: 60.0000 mg | ORAL_TABLET | Freq: Every day | ORAL | Status: DC

## 2012-07-16 MED ORDER — METFORMIN HCL ER 500 MG PO TB24: 500.0000 mg | ORAL_TABLET | Freq: Every day | ORAL | Status: DC

## 2012-07-16 MED ORDER — PANCRELIPASE (LIP-PROT-AMYL) 24000-76000 UNITS PO CPEP: ORAL_CAPSULE | ORAL | Status: DC

## 2012-07-16 NOTE — Telephone Encounter (Signed)
Rx Refilled  

## 2012-07-20 ENCOUNTER — Telehealth: Payer: Self-pay | Admitting: Internal Medicine

## 2012-07-20 MED ORDER — ESOMEPRAZOLE MAGNESIUM 40 MG PO CPDR: 40.0000 mg | DELAYED_RELEASE_CAPSULE | Freq: Every day | ORAL | Status: DC

## 2012-07-20 NOTE — Telephone Encounter (Signed)
Done

## 2012-07-20 NOTE — Telephone Encounter (Signed)
Pt is requesting a 30 day supply of nexium sent to [facility] and regular rx for 90 day supplies sent to [facility]

## 2012-07-20 NOTE — Telephone Encounter (Signed)
Edmon Crape (daughter) called to make OV for patient and also asked if we had any Nexium samples because patient is out and is requesting refills. I forwarded VM to JL about [facility]

## 2012-07-20 NOTE — Telephone Encounter (Signed)
#  3 boxes of nexium samples at the front desk. LMOM.

## 2012-07-21 ENCOUNTER — Telehealth: Payer: Self-pay | Admitting: Family Medicine

## 2012-07-21 NOTE — Telephone Encounter (Signed)
The patient told me that she had stopped metoclopramide.  He should only be used sparingly and PRN for nausea and vomiting.  She did have 30 tablets and this should last at least 3 months.

## 2012-07-23 NOTE — Telephone Encounter (Signed)
Pt has stopped taking the medication.

## 2012-08-03 ENCOUNTER — Ambulatory Visit (INDEPENDENT_AMBULATORY_CARE_PROVIDER_SITE_OTHER): Payer: Medicare Other | Admitting: *Deleted

## 2012-08-03 ENCOUNTER — Other Ambulatory Visit: Payer: Self-pay | Admitting: Family Medicine

## 2012-08-03 DIAGNOSIS — E119 Type 2 diabetes mellitus without complications: Secondary | ICD-10-CM

## 2012-08-03 DIAGNOSIS — E785 Hyperlipidemia, unspecified: Secondary | ICD-10-CM

## 2012-08-03 DIAGNOSIS — I10 Essential (primary) hypertension: Secondary | ICD-10-CM

## 2012-08-03 DIAGNOSIS — Z23 Encounter for immunization: Secondary | ICD-10-CM

## 2012-08-03 DIAGNOSIS — Z2911 Encounter for prophylactic immunotherapy for respiratory syncytial virus (RSV): Secondary | ICD-10-CM

## 2012-08-03 LAB — CBC WITH DIFFERENTIAL/PLATELET
Eosinophils Relative: 1 % (ref 0–5)
HCT: 35.6 % — ABNORMAL LOW (ref 36.0–46.0)
Hemoglobin: 12 g/dL (ref 12.0–15.0)
Lymphocytes Relative: 28 % (ref 12–46)
Lymphs Abs: 1.1 10*3/uL (ref 0.7–4.0)
MCV: 82 fL (ref 78.0–100.0)
Monocytes Absolute: 0.3 10*3/uL (ref 0.1–1.0)
RBC: 4.34 MIL/uL (ref 3.87–5.11)
WBC: 3.9 10*3/uL — ABNORMAL LOW (ref 4.0–10.5)

## 2012-08-03 LAB — HEPATIC FUNCTION PANEL
ALT: 14 U/L (ref 0–35)
AST: 17 U/L (ref 0–37)
Bilirubin, Direct: 0.1 mg/dL (ref 0.0–0.3)

## 2012-08-03 LAB — BASIC METABOLIC PANEL
Potassium: 4.1 mEq/L (ref 3.5–5.3)
Sodium: 130 mEq/L — ABNORMAL LOW (ref 135–145)

## 2012-08-03 LAB — LIPID PANEL
Cholesterol: 124 mg/dL (ref 0–200)
VLDL: 18 mg/dL (ref 0–40)

## 2012-08-03 MED ORDER — PANCRELIPASE (LIP-PROT-AMYL) 24000-76000 UNITS PO CPEP: ORAL_CAPSULE | ORAL | Status: DC

## 2012-08-03 MED ORDER — INSULIN SYRINGES (DISPOSABLE) U-100 1 ML MISC: 100.0000 [IU] | Freq: Two times a day (BID) | Status: DC

## 2012-08-03 NOTE — Telephone Encounter (Signed)
rf meds

## 2012-08-04 ENCOUNTER — Telehealth: Payer: Self-pay | Admitting: Family Medicine

## 2012-08-04 DIAGNOSIS — E871 Hypo-osmolality and hyponatremia: Secondary | ICD-10-CM

## 2012-08-04 NOTE — Telephone Encounter (Signed)
Message copied by Donne Anon on Wed Aug 04, 2012  4:41 PM ------      Message from: Lynnea Ferrier      Created: Wed Aug 04, 2012  7:23 AM       Diabetes and cholesterol tests are excellent.  Sodium is slightly low at 130.  Try to restrict fluids she drinks to 1 liter a day.  Recheck sodium in 1 month, sooner if worse. ------

## 2012-08-04 NOTE — Telephone Encounter (Signed)
Left pt mess to return call.

## 2012-08-05 ENCOUNTER — Ambulatory Visit: Payer: Medicare Other | Admitting: Gastroenterology

## 2012-08-11 NOTE — Telephone Encounter (Signed)
Able to reach daughter and informed of lab reults.  Order for BMP in one month entered.

## 2012-08-23 ENCOUNTER — Telehealth: Payer: Self-pay | Admitting: Family Medicine

## 2012-08-23 ENCOUNTER — Telehealth: Payer: Self-pay | Admitting: Internal Medicine

## 2012-08-23 MED ORDER — ESOMEPRAZOLE MAGNESIUM 40 MG PO CPDR: 40.0000 mg | DELAYED_RELEASE_CAPSULE | Freq: Two times a day (BID) | ORAL | Status: DC

## 2012-08-23 NOTE — Telephone Encounter (Signed)
pts daughter is aware and she is aware of the pts appt on Thursday, 08/26/12

## 2012-08-23 NOTE — Telephone Encounter (Signed)
Tried to call pts daughter, VM has not been set up yet.

## 2012-08-23 NOTE — Telephone Encounter (Signed)
[name] (Mrs Larina Bras) called to ask if we could change pt's Nexium to 2 x a day instead of once a day. She uses [facility]. Please advise. Daughter's number is [phone] or [phone]

## 2012-08-23 NOTE — Telephone Encounter (Signed)
RX for BID for 90 days only. She needs to keep OV to address how long to stay on high dose PPI.

## 2012-08-24 MED ORDER — INSULIN NPH (HUMAN) (ISOPHANE) 100 UNIT/ML ~~LOC~~ SUSP: SUBCUTANEOUS | Status: DC

## 2012-08-24 NOTE — Telephone Encounter (Signed)
Rx Refilled  

## 2012-08-26 ENCOUNTER — Encounter: Payer: Self-pay | Admitting: Gastroenterology

## 2012-08-26 ENCOUNTER — Ambulatory Visit (INDEPENDENT_AMBULATORY_CARE_PROVIDER_SITE_OTHER): Payer: Medicare Other | Admitting: Gastroenterology

## 2012-08-26 VITALS — BP 142/74 | HR 77 | Temp 97.8°F | Ht 63.0 in | Wt 135.6 lb

## 2012-08-26 DIAGNOSIS — K219 Gastro-esophageal reflux disease without esophagitis: Secondary | ICD-10-CM

## 2012-08-26 DIAGNOSIS — K589 Irritable bowel syndrome without diarrhea: Secondary | ICD-10-CM

## 2012-08-26 MED ORDER — HYOSCYAMINE SULFATE 0.125 MG SL SUBL: 0.1250 mg | SUBLINGUAL_TABLET | Freq: Four times a day (QID) | SUBLINGUAL | Status: DC | PRN

## 2012-08-26 NOTE — Patient Instructions (Addendum)
1. Trial of Levsin for abdominal cramping, diarrhea. Please use only as needed. Avoid the heat if you're taking this medication as it can make it difficult for you to perspire. 2. Please monitor the nausea a you're having. When you develop nausea please check your blood sugar to see if it is low or high at that time.  3. If you continue to feel "washed out", please have your sodium rechecked by [place] or Dr. Tanya Nones. 4. Please limit metoclopramide use due to risk of uncontrollable muscle movements/twitches. 5. Office visit with Dr. Jena Gauss in one year or sooner if needed.

## 2012-08-26 NOTE — Progress Notes (Signed)
Primary Care Physician: Leo Grosser, MD  Primary Gastroenterologist:  Roetta Sessions, MD   Chief Complaint  Patient presents with  . Follow-up    HPI: Misty Edwards is a 79 y.o. female here for f/u. Last seen in 03/2012. She has h/o chronic pancreatitis, pancreatic pseudocyst, mild thrombocytopenia without evidence of liver disease.  No longer sees Dr. Sudie Bailey. Dr. Tanya Nones now. Rarely takes insulin at night. Recently decreased NPH to 4 units qhs prn instead of 8 units qhs prn due to low AM sugars. She was told she was drinking way too much water. Her sodium level has been low. She reports to drinking significant amount of water previously. Recently cut back to 32 ounces outside of other beverages she drinks. Over the weekend she felt real weak. She was working outside for about an hour, felt like she had a lump in her throat became nauseated. She continues to have nausea which is predominantly in the mornings. Chest pain better after belching. No blood in stool. Continues to use Reglan periodically, at last office visit we advised her to wean off. States sometimes she will use up to 3 times a day but it is not on a daily basis. She is aware of the risk of permanent tardive dyskinesia. Really denies any vomiting. She cut back on her Nexium for a couple of weeks because she was going to run out but had breakthrough symptoms. She uses Imodium frequently for loose stools. She is still on Creon. Daughter was wondering about use of Levsin.     Current Outpatient Prescriptions  Medication Sig Dispense Refill  . acetaminophen (TYLENOL) 500 MG tablet Take 500-1,000 mg by mouth 2 (two) times daily as needed. Pain.      Marland Kitchen atenolol (TENORMIN) 25 MG tablet Take 1 tablet (25 mg total) by mouth daily.  90 tablet  4  . calcium carbonate (TUMS EX) 750 MG chewable tablet Chew 1 tablet by mouth daily.      . Carbamide Peroxide (EAR DROPS OT) Place 2 drops in ear(s) daily as needed. Ear pain.      .  cetirizine (ZYRTEC) 10 MG tablet Take 1 tablet (10 mg total) by mouth daily.  90 tablet  4  . cycloSPORINE (RESTASIS) 0.05 % ophthalmic emulsion Place 1 drop into both eyes 3 (three) times daily.       Marland Kitchen docusate sodium (COLACE) 100 MG capsule Take 100 mg by mouth 2 (two) times daily as needed. Constipation.      . enalapril (VASOTEC) 20 MG tablet Take 0.5 tablets (10 mg total) by mouth 2 (two) times daily. 5 mg at lunch time and 10 mg at bedtime.  180 tablet  2  . esomeprazole (NEXIUM) 40 MG capsule Take 1 capsule (40 mg total) by mouth 2 (two) times daily before a meal.  180 capsule  0  . hydrochlorothiazide (HYDRODIURIL) 25 MG tablet Take 1 tablet (25 mg total) by mouth daily.  90 tablet  4  . Insulin Syringes, Disposable, U-100 1 ML MISC 100 Units by Does not apply route 2 (two) times daily. BD Syringes with Ultra Fine Needle (1/16ml - - 31G)  100 each  1  . LORazepam (ATIVAN) 0.5 MG tablet Take 0.5 mg by mouth every 8 (eight) hours.      . lovastatin (MEVACOR) 20 MG tablet Take 1 tablet (20 mg total) by mouth at bedtime.  90 tablet  4  . metFORMIN (GLUCOPHAGE-XR) 500 MG 24 hr tablet Take 1 tablet (  500 mg total) by mouth daily with breakfast.  90 tablet  4  . metoCLOPramide (REGLAN) 5 MG tablet Take 1 tablet (5 mg total) by mouth 4 (four) times daily.  360 tablet  4  . Multiple Vitamin (MULTIVITAMIN) capsule Take 1 capsule by mouth daily.      . Pancrelipase, Lip-Prot-Amyl, (CREON) 24000 UNITS CPEP 2 w/ meals, 1 w/ snacks  720 capsule  0  . promethazine (PHENERGAN) 25 MG tablet Take 1 tablet (25 mg total) by mouth every 6 (six) hours as needed. Nausea.  90 tablet  4  . raloxifene (EVISTA) 60 MG tablet Take 1 tablet (60 mg total) by mouth daily.  90 tablet  4  . zolpidem (AMBIEN) 10 MG tablet Take 10 mg by mouth at bedtime as needed. Sleep.      . insulin NPH (HUMULIN N,NOVOLIN N) 100 UNIT/ML injection 42 units in am..4 units in pm - Disp.: QS x 3 months*  ONLY TAKES THE MORNING DOSE        No current facility-administered medications for this visit.    Allergies as of 08/26/2012 - Review Complete 08/26/2012  Allergen Reaction Noted  . Calcium-containing compounds Nausea Only 08/21/2011  . Vitamin d analogs Nausea Only 08/21/2011    ROS:  General: Negative for anorexia, weight loss, fever, chills, fatigue, weakness. ENT: Negative for hoarseness, difficulty swallowing , nasal congestion. CV: Negative for chest pain, angina, palpitations, dyspnea on exertion, peripheral edema.  Respiratory: Negative for dyspnea at rest, dyspnea on exertion, cough, sputum, wheezing.  GI: See history of present illness. GU:  Negative for dysuria, hematuria, urinary incontinence, urinary frequency, nocturnal urination.  Endo: Negative for unusual weight change.    Physical Examination:   BP 142/74  Pulse 77  Temp(Src) 97.8 F (36.6 C) (Oral)  Ht 5\' 3"  (1.6 m)  Wt 135 lb 9.6 oz (61.508 kg)  BMI 24.03 kg/m2  General: Well-nourished, well-developed in no acute distress. Accompanied by dgt. Eyes: No icterus. Mouth: Oropharyngeal mucosa moist and pink , no lesions erythema or exudate. Lungs: Clear to auscultation bilaterally.  Heart: Regular rate and rhythm, no murmurs rubs or gallops.  Abdomen: Bowel sounds are normal, nontender, nondistended, no hepatosplenomegaly or masses, no abdominal bruits or hernia , no rebound or guarding.   Extremities: No lower extremity edema. No clubbing or deformities. Neuro: Alert and oriented x 4   Skin: Warm and dry, no jaundice.   Psych: Alert and cooperative, normal mood and affect.  Labs:  Lab Results  Component Value Date   CREATININE 0.85 08/03/2012   BUN 15 08/03/2012   NA 130* 08/03/2012   K 4.1 08/03/2012   CL 93* 08/03/2012   CO2 27 08/03/2012   Lab Results  Component Value Date   WBC 3.9* 08/03/2012   HGB 12.0 08/03/2012   HCT 35.6* 08/03/2012   MCV 82.0 08/03/2012   PLT 117* 08/03/2012   Lab Results  Component Value Date   ALT 14  08/03/2012   AST 17 08/03/2012   ALKPHOS 33* 08/03/2012   BILITOT 0.5 08/03/2012    Imaging Studies: No results found.

## 2012-08-27 ENCOUNTER — Encounter: Payer: Self-pay | Admitting: Gastroenterology

## 2012-08-27 NOTE — Assessment & Plan Note (Signed)
Overall she seems to be doing well. Requires BID PPI for adequate control of GERD. Still using Reglan prn nausea and at sometimes up to 2-3 times per day. She reports much of her nausea is in early AM prior to meals. She may have element of gastroparesis but I doubt significant. She tolerates meals, denies early satiety. Suspect element of hypoglycemia as factor in early AM nausea. Requested she check blood glucose with these episodes to try and determine a correlation. If hypoglycemia documented, she is to f/u with Dr. Tanya Nones.   She also has intermittent frequent loose stools which she self medicates with Imodium. She states she is taking her Creon as prescribed. She is up-to-date on her colonoscopy. Trial of Levsin. Call with further problems. Office visit in May 2015 by Dr. Jena Gauss.

## 2012-08-30 NOTE — Progress Notes (Signed)
Cc PCP 

## 2012-09-02 ENCOUNTER — Telehealth: Payer: Self-pay | Admitting: Family Medicine

## 2012-09-02 MED ORDER — INSULIN NPH (HUMAN) (ISOPHANE) 100 UNIT/ML ~~LOC~~ SUSP: SUBCUTANEOUS | Status: DC

## 2012-09-02 NOTE — Telephone Encounter (Signed)
Insurance does not cover Humulin N insulin.  Prefers Novolin N insulin.  OK per Dr.Pickard to change insulin to the Novolin N insulin 42u in AM and 4u at bedtime.  Rx sent to [facility]

## 2012-09-06 ENCOUNTER — Other Ambulatory Visit (INDEPENDENT_AMBULATORY_CARE_PROVIDER_SITE_OTHER): Payer: Self-pay

## 2012-09-06 DIAGNOSIS — E871 Hypo-osmolality and hyponatremia: Secondary | ICD-10-CM

## 2012-09-06 LAB — BASIC METABOLIC PANEL
Calcium: 9.6 mg/dL (ref 8.4–10.5)
Chloride: 101 mEq/L (ref 96–112)
Creat: 0.81 mg/dL (ref 0.50–1.10)

## 2012-10-11 ENCOUNTER — Ambulatory Visit: Payer: Medicare Other | Admitting: Family Medicine

## 2012-10-18 ENCOUNTER — Ambulatory Visit: Payer: Medicare Other | Admitting: Family Medicine

## 2012-10-20 ENCOUNTER — Encounter: Payer: Self-pay | Admitting: Family Medicine

## 2012-10-20 ENCOUNTER — Ambulatory Visit (INDEPENDENT_AMBULATORY_CARE_PROVIDER_SITE_OTHER): Payer: Medicare Other | Admitting: Family Medicine

## 2012-10-20 ENCOUNTER — Telehealth: Payer: Self-pay | Admitting: Family Medicine

## 2012-10-20 VITALS — BP 150/80 | HR 72 | Temp 98.0°F | Resp 16 | Wt 135.0 lb

## 2012-10-20 DIAGNOSIS — E119 Type 2 diabetes mellitus without complications: Secondary | ICD-10-CM

## 2012-10-20 DIAGNOSIS — I10 Essential (primary) hypertension: Secondary | ICD-10-CM

## 2012-10-20 DIAGNOSIS — E871 Hypo-osmolality and hyponatremia: Secondary | ICD-10-CM

## 2012-10-20 DIAGNOSIS — E785 Hyperlipidemia, unspecified: Secondary | ICD-10-CM

## 2012-10-20 LAB — COMPLETE METABOLIC PANEL WITH GFR
ALT: 15 U/L (ref 0–35)
AST: 18 U/L (ref 0–37)
Albumin: 4.2 g/dL (ref 3.5–5.2)
Alkaline Phosphatase: 30 U/L — ABNORMAL LOW (ref 39–117)
Calcium: 9.2 mg/dL (ref 8.4–10.5)
Chloride: 96 mEq/L (ref 96–112)
Potassium: 4.7 mEq/L (ref 3.5–5.3)
Sodium: 133 mEq/L — ABNORMAL LOW (ref 135–145)
Total Protein: 6.6 g/dL (ref 6.0–8.3)

## 2012-10-20 MED ORDER — INSULIN SYRINGES (DISPOSABLE) U-100 1 ML MISC: 100.0000 [IU] | Freq: Two times a day (BID) | Status: DC

## 2012-10-20 NOTE — Progress Notes (Signed)
Subjective:    Patient ID: Misty Edwards, female    DOB: Nov 19, 1926, 79 y.o.   MRN: [id number]  HPI  The patient's last office visit, her hemoglobin A1c was found to be 6.3. I recommended decreasing her insulin to 4 units at night because of hypoglycemia in the morning.  Patient is not having as many hypoglycemic events. However she is complaining of severe diarrhea on a daily basis which he attributes to metformin. She denies any polyuria, polydipsia, or blurred vision. Her blood pressure at home has been running 130/80. She gets nervous in the doctor's office and has white coat hypertension. She denies any chest pain, shortness of breath, or dyspnea on exertion. She also has hyponatremia. She is been trying to restrict her fluids to less than 1200 mL per day. On a fluid restriction her sodium level has been 138 and is due to be rechecked today. She does complain of some dizziness and some mild fatigue. Chest has hyperlipidemia.  She is currently on lovastatin 20 mg by mouth daily. She is due to recheck fasting lipid panel however he is not fasting today. Past Medical History  Diagnosis Date  . Hypertension   . History of carcinoma in situ of breast 1988  . Acute biliary pancreatitis 07/2002    thia was in 07/2004:she still has pseudocyst in tail of pancreas measuring 54 x 33 mm   . Upper GI bleed September2004    secondary to gastritis  . Diabetes mellitus   . Adrenal adenoma     bilateral  . Detached retina   . Chronic pancreatitis   . Hyperlipidemia   . Osteopenia   . IBS (irritable bowel syndrome)   . Legally blind   . Anxiety   . Osteoarthritis    Current Outpatient Prescriptions on File Prior to Visit  Medication Sig Dispense Refill  . acetaminophen (TYLENOL) 500 MG tablet Take 500-1,000 mg by mouth 2 (two) times daily as needed. Pain.      Marland Kitchen atenolol (TENORMIN) 25 MG tablet Take 1 tablet (25 mg total) by mouth daily.  90 tablet  4  . calcium carbonate (TUMS EX) 750 MG chewable  tablet Chew 1 tablet by mouth daily.      . Carbamide Peroxide (EAR DROPS OT) Place 2 drops in ear(s) daily as needed. Ear pain.      . cetirizine (ZYRTEC) 10 MG tablet Take 1 tablet (10 mg total) by mouth daily.  90 tablet  4  . cycloSPORINE (RESTASIS) 0.05 % ophthalmic emulsion Place 1 drop into both eyes 3 (three) times daily.       . enalapril (VASOTEC) 20 MG tablet Take 0.5 tablets (10 mg total) by mouth 2 (two) times daily. 5 mg at lunch time and 10 mg at bedtime.  180 tablet  2  . esomeprazole (NEXIUM) 40 MG capsule Take 1 capsule (40 mg total) by mouth 2 (two) times daily before a meal.  180 capsule  0  . hydrochlorothiazide (HYDRODIURIL) 25 MG tablet Take 1 tablet (25 mg total) by mouth daily.  90 tablet  4  . insulin NPH (HUMULIN N,NOVOLIN N) 100 UNIT/ML injection Inject S/Q 42 units each morning and 4 units at bedtime  5 vial  3  . LORazepam (ATIVAN) 0.5 MG tablet Take 0.5 mg by mouth every 8 (eight) hours.      . lovastatin (MEVACOR) 20 MG tablet Take 1 tablet (20 mg total) by mouth at bedtime.  90 tablet  4  .  metFORMIN (GLUCOPHAGE-XR) 500 MG 24 hr tablet Take 1 tablet (500 mg total) by mouth daily with breakfast.  90 tablet  4  . Multiple Vitamin (MULTIVITAMIN) capsule Take 1 capsule by mouth daily.      . Pancrelipase, Lip-Prot-Amyl, (CREON) 24000 UNITS CPEP 2 w/ meals, 1 w/ snacks  720 capsule  0  . raloxifene (EVISTA) 60 MG tablet Take 1 tablet (60 mg total) by mouth daily.  90 tablet  4  . zolpidem (AMBIEN) 10 MG tablet Take 10 mg by mouth at bedtime as needed. Sleep.      . hyoscyamine (LEVSIN SL) 0.125 MG SL tablet Place 1 tablet (0.125 mg total) under the tongue every 6 (six) hours as needed for cramping or diarrhea or loose stools.  360 tablet  1   No current facility-administered medications on file prior to visit.   Allergies  Allergen Reactions  . Calcium-Containing Compounds Nausea Only  . Vitamin D Analogs Nausea Only   History   Social History  . Marital Status:  Married    Spouse Name: N/A    Number of Children: 5  . Years of Education: N/A   Occupational History  . homemaker    Social History Main Topics  . Smoking status: Never Smoker   . Smokeless tobacco: Not on file  . Alcohol Use: No  . Drug Use: No  . Sexually Active: Not on file   Other Topics Concern  . Not on file   Social History Narrative  . No narrative on file   Family History  Problem Relation Age of Onset  . Colon cancer Father 39    passed away in his 37's  . Colon cancer Brother 50    surgery inhis 50's doing well now  . Heart attack Brother     passed away age 48  . Pancreatitis Brother   . Ovarian cancer Mother     passed  . Kidney disease Daughter   . Leukemia Son      Review of Systems  All other systems reviewed and are negative.       Objective:   Physical Exam  Vitals reviewed. Constitutional: She is oriented to person, place, and time. She appears well-developed and well-nourished.  Eyes: Conjunctivae are normal. Pupils are equal, round, and reactive to light.  Neck: Neck supple. No JVD present. No thyromegaly present.  Cardiovascular: Normal rate, regular rhythm, normal heart sounds and intact distal pulses.  Exam reveals no gallop and no friction rub.   No murmur heard. Pulmonary/Chest: Effort normal and breath sounds normal. No respiratory distress. She has no wheezes. She has no rales. She exhibits no tenderness.  Abdominal: Soft. Bowel sounds are normal. She exhibits no distension and no mass. There is no tenderness. There is no rebound and no guarding.  Musculoskeletal: She exhibits no edema.  Lymphadenopathy:    She has no cervical adenopathy.  Neurological: She is alert and oriented to person, place, and time. She has normal reflexes. No cranial nerve deficit. She exhibits normal muscle tone. Coordination normal.  Skin: Skin is warm. No rash noted. No erythema. No pallor.          Assessment & Plan:  1. Low sodium  levels Recheck sodium level.  Consider sodium bicarbonate if hyponatremia persists the. - COMPLETE METABOLIC PANEL WITH GFR  2. Type II or unspecified type diabetes mellitus without mention of complication, not stated as uncontrolled Check hemoglobin A1c. If less than 6.5 discontinue metformin due to  diarrhea - COMPLETE METABOLIC PANEL WITH GFR - Hemoglobin A1c  3. Unspecified essential hypertension I have asked the patient to return fasting to check a fasting lipid panel. Goal LDL would be less than 100. Her blood pressure is well controlled at home. I made no changes in her medication at this time the - COMPLETE METABOLIC PANEL WITH GFR - Lipid panel  4. HLD (hyperlipidemia) Return fasting to check a fasting lipid panel. Goal LDL is less than 100 given her history of diabetes. - COMPLETE METABOLIC PANEL WITH GFR - Lipid panel

## 2012-10-21 MED ORDER — INSULIN NPH (HUMAN) (ISOPHANE) 100 UNIT/ML ~~LOC~~ SUSP: SUBCUTANEOUS | Status: DC

## 2012-10-21 NOTE — Telephone Encounter (Signed)
Per insurance has to try something on formulary. So per [facility] will switch to Novolin and she will keep a check on her BS and if a problem will call us. Patients daughter aware.

## 2012-11-08 ENCOUNTER — Telehealth: Payer: Self-pay | Admitting: Family Medicine

## 2012-11-08 NOTE — Telephone Encounter (Signed)
Test strips for Accu-Chek meter Please call [facility] when this is done because they have been having problem with [facility] and she wants to know when we get it done so that she can call them.

## 2012-11-09 MED ORDER — GLUCOSE BLOOD VI STRP: ORAL_STRIP | Status: DC

## 2012-11-09 NOTE — Telephone Encounter (Signed)
Med refilled.

## 2012-12-13 ENCOUNTER — Telehealth: Payer: Self-pay | Admitting: Family Medicine

## 2012-12-13 NOTE — Telephone Encounter (Signed)
.?   OK to Refill and it looks like she was taking Lorazepam but we have not given it to her since we have went live on [facility]?

## 2012-12-13 NOTE — Telephone Encounter (Signed)
Ambien 10 mg 1 QHS prn sleep #30 She is also on a nerve medication, but [facility] did not know the name of it and she needs a refill on it.

## 2012-12-13 NOTE — Telephone Encounter (Signed)
I am okay with her taking lorazepam 0.5 mg po q 8 hrs as needed if used sparingly.  Call out 30 with 0 refills and warn her about sedation and falls.

## 2012-12-14 MED ORDER — LORAZEPAM 0.5 MG PO TABS: 0.5000 mg | ORAL_TABLET | Freq: Three times a day (TID) | ORAL | Status: DC

## 2012-12-14 MED ORDER — ZOLPIDEM TARTRATE 10 MG PO TABS: 10.0000 mg | ORAL_TABLET | Freq: Every evening | ORAL | Status: DC | PRN

## 2012-12-14 NOTE — Telephone Encounter (Signed)
Rx Refilled  

## 2013-01-24 ENCOUNTER — Ambulatory Visit: Payer: Medicare Other | Admitting: Family Medicine

## 2013-01-24 ENCOUNTER — Encounter (INDEPENDENT_AMBULATORY_CARE_PROVIDER_SITE_OTHER): Payer: Medicare Other | Admitting: Ophthalmology

## 2013-02-04 ENCOUNTER — Telehealth: Payer: Self-pay | Admitting: Family Medicine

## 2013-02-04 ENCOUNTER — Ambulatory Visit (INDEPENDENT_AMBULATORY_CARE_PROVIDER_SITE_OTHER): Payer: Medicare Other | Admitting: Family Medicine

## 2013-02-04 ENCOUNTER — Encounter: Payer: Self-pay | Admitting: Family Medicine

## 2013-02-04 VITALS — BP 140/78 | HR 70 | Temp 97.0°F | Resp 14 | Wt 136.0 lb

## 2013-02-04 DIAGNOSIS — Z23 Encounter for immunization: Secondary | ICD-10-CM

## 2013-02-04 DIAGNOSIS — E119 Type 2 diabetes mellitus without complications: Secondary | ICD-10-CM

## 2013-02-04 DIAGNOSIS — E785 Hyperlipidemia, unspecified: Secondary | ICD-10-CM

## 2013-02-04 DIAGNOSIS — I10 Essential (primary) hypertension: Secondary | ICD-10-CM

## 2013-02-04 LAB — COMPLETE METABOLIC PANEL WITH GFR
ALT: 11 U/L (ref 0–35)
AST: 16 U/L (ref 0–37)
Albumin: 4.5 g/dL (ref 3.5–5.2)
BUN: 17 mg/dL (ref 6–23)
CO2: 28 mEq/L (ref 19–32)
Calcium: 9.9 mg/dL (ref 8.4–10.5)
Creat: 0.87 mg/dL (ref 0.50–1.10)
GFR, Est African American: 71 mL/min
GFR, Est Non African American: 61 mL/min
Total Bilirubin: 0.5 mg/dL (ref 0.3–1.2)
Total Protein: 7.1 g/dL (ref 6.0–8.3)

## 2013-02-04 LAB — LIPID PANEL
Cholesterol: 124 mg/dL (ref 0–200)
HDL: 44 mg/dL (ref >39)
LDL Cholesterol: 51 mg/dL (ref 0–99)
Triglycerides: 145 mg/dL (ref <150)

## 2013-02-04 LAB — HEMOGLOBIN A1C: Hgb A1c MFr Bld: 6.6 % — ABNORMAL HIGH (ref <5.7)

## 2013-02-04 MED ORDER — LORAZEPAM 0.5 MG PO TABS: 0.5000 mg | ORAL_TABLET | Freq: Three times a day (TID) | ORAL | Status: DC

## 2013-02-04 MED ORDER — ZOLPIDEM TARTRATE 10 MG PO TABS: 10.0000 mg | ORAL_TABLET | Freq: Every evening | ORAL | Status: DC | PRN

## 2013-02-04 NOTE — Telephone Encounter (Signed)
Med phoned in °

## 2013-02-04 NOTE — Telephone Encounter (Signed)
Patient needs to have her ATIVAN refilled . Would like to know if he can up the dosage due to changes in the household? AMBEIN needs to be refilled as well.

## 2013-02-04 NOTE — Telephone Encounter (Signed)
Ok to refill 

## 2013-02-04 NOTE — Progress Notes (Signed)
Subjective:    Patient ID: Misty Edwards, female    DOB: 1926-10-12, 79 y.o.   MRN: [id number]  HPI  10/20/12 The patient's last office visit, her hemoglobin A1c was found to be 6.3. I recommended decreasing her insulin to 4 units at night because of hypoglycemia in the morning.  Patient is not having as many hypoglycemic events. However she is complaining of severe diarrhea on a daily basis which he attributes to metformin. She denies any polyuria, polydipsia, or blurred vision. Her blood pressure at home has been running 130/80. She gets nervous in the doctor's office and has white coat hypertension. She denies any chest pain, shortness of breath, or dyspnea on exertion. She also has hyponatremia. She is been trying to restrict her fluids to less than 1200 mL per day. On a fluid restriction her sodium level has been 138 and is due to be rechecked today. She does complain of some dizziness and some mild fatigue. Chest has hyperlipidemia.  She is currently on lovastatin 20 mg by mouth daily. She is due to recheck fasting lipid panel however he is not fasting today.  At that time, my plan was: 1. Low sodium levels Recheck sodium level.  Consider sodium bicarbonate if hyponatremia persists the. - COMPLETE METABOLIC PANEL WITH GFR  2. Type II or unspecified type diabetes mellitus without mention of complication, not stated as uncontrolled Check hemoglobin A1c. If less than 6.5 discontinue metformin due to diarrhea - COMPLETE METABOLIC PANEL WITH GFR - Hemoglobin A1c  3. Unspecified essential hypertension I have asked the patient to return fasting to check a fasting lipid panel. Goal LDL would be less than 100. Her blood pressure is well controlled at home. I made no changes in her medication at this time the - COMPLETE METABOLIC PANEL WITH GFR - Lipid panel  4. HLD (hyperlipidemia) Return fasting to check a fasting lipid panel. Goal LDL is less than 100 given her history of diabetes. - COMPLETE  METABOLIC PANEL WITH GFR - Lipid panel  02/03/13 Is here today for followup.  Her sodium level last time was 133 and acceptable. Her A1c was 6.5. The decision was made to discontinue metformin. Since discontinuing metformin her diarrhea has improved. Her blood sugars did range between 102 100. She has rare occasional episodes of hypoglycemia. Overall she's been doing well. The remainder of the review of systems is negative. She is due today for a flu shot as well as Prevnar 13. Her blood pressure is well controlled at home at 115/50. She does have an element of whitecoat hypertension explaining the slightly high reading here. He denies any chest pain, shortness of breath, dyspnea on exertion. She also denies any myalgias or right upper quadrant pain on her statin.   Past Medical History  Diagnosis Date  . Hypertension   . History of carcinoma in situ of breast 1988  . Acute biliary pancreatitis 07/2002    thia was in 07/2004:she still has pseudocyst in tail of pancreas measuring 54 x 33 mm   . Upper GI bleed September2004    secondary to gastritis  . Diabetes mellitus   . Adrenal adenoma     bilateral  . Detached retina   . Chronic pancreatitis   . Hyperlipidemia   . Osteopenia   . IBS (irritable bowel syndrome)   . Legally blind   . Anxiety   . Osteoarthritis    Current Outpatient Prescriptions on File Prior to Visit  Medication Sig Dispense Refill  .  acetaminophen (TYLENOL) 500 MG tablet Take 500-1,000 mg by mouth 2 (two) times daily as needed. Pain.      Marland Kitchen atenolol (TENORMIN) 25 MG tablet Take 1 tablet (25 mg total) by mouth daily.  90 tablet  4  . calcium carbonate (TUMS EX) 750 MG chewable tablet Chew 1 tablet by mouth daily.      . Carbamide Peroxide (EAR DROPS OT) Place 2 drops in ear(s) daily as needed. Ear pain.      . cetirizine (ZYRTEC) 10 MG tablet Take 1 tablet (10 mg total) by mouth daily.  90 tablet  4  . cycloSPORINE (RESTASIS) 0.05 % ophthalmic emulsion Place 1 drop  into both eyes 3 (three) times daily.       . enalapril (VASOTEC) 20 MG tablet Take 0.5 tablets (10 mg total) by mouth 2 (two) times daily. 5 mg at lunch time and 10 mg at bedtime.  180 tablet  2  . esomeprazole (NEXIUM) 40 MG capsule Take 1 capsule (40 mg total) by mouth 2 (two) times daily before a meal.  180 capsule  0  . glucose blood test strip Use as instructed  100 each  12  . hydrochlorothiazide (HYDRODIURIL) 25 MG tablet Take 1 tablet (25 mg total) by mouth daily.  90 tablet  4  . hyoscyamine (LEVSIN SL) 0.125 MG SL tablet Place 1 tablet (0.125 mg total) under the tongue every 6 (six) hours as needed for cramping or diarrhea or loose stools.  360 tablet  1  . insulin NPH (NOVOLIN N) 100 UNIT/ML injection 42 units QAM and 4 units QPM  6 vial  4  . Insulin Syringes, Disposable, U-100 1 ML MISC 100 Units by Does not apply route 2 (two) times daily. BD Syringes with Ultra Fine Needle (1/9ml - - 31G)  100 each  5  . LORazepam (ATIVAN) 0.5 MG tablet Take 1 tablet (0.5 mg total) by mouth every 8 (eight) hours.  30 tablet  0  . lovastatin (MEVACOR) 20 MG tablet Take 1 tablet (20 mg total) by mouth at bedtime.  90 tablet  4  . metFORMIN (GLUCOPHAGE-XR) 500 MG 24 hr tablet Take 1 tablet (500 mg total) by mouth daily with breakfast.  90 tablet  4  . Multiple Vitamin (MULTIVITAMIN) capsule Take 1 capsule by mouth daily.      . Pancrelipase, Lip-Prot-Amyl, (CREON) 24000 UNITS CPEP 2 w/ meals, 1 w/ snacks  720 capsule  0  . raloxifene (EVISTA) 60 MG tablet Take 1 tablet (60 mg total) by mouth daily.  90 tablet  4  . zolpidem (AMBIEN) 10 MG tablet Take 1 tablet (10 mg total) by mouth at bedtime as needed. Sleep.  30 tablet  0   No current facility-administered medications on file prior to visit.   Allergies  Allergen Reactions  . Calcium-Containing Compounds Nausea Only  . Vitamin D Analogs Nausea Only   History   Social History  . Marital Status: Married    Spouse Name: N/A    Number of  Children: 5  . Years of Education: N/A   Occupational History  . homemaker    Social History Main Topics  . Smoking status: Never Smoker   . Smokeless tobacco: Not on file  . Alcohol Use: No  . Drug Use: No  . Sexual Activity: Not on file   Other Topics Concern  . Not on file   Social History Narrative  . No narrative on file   Family  History  Problem Relation Age of Onset  . Colon cancer Father 42    passed away in his 45's  . Colon cancer Brother 50    surgery inhis 50's doing well now  . Heart attack Brother     passed away age 16  . Pancreatitis Brother   . Ovarian cancer Mother     passed  . Kidney disease Daughter   . Leukemia Son      Review of Systems  All other systems reviewed and are negative.       Objective:   Physical Exam  Vitals reviewed. Constitutional: She is oriented to person, place, and time. She appears well-developed and well-nourished.  Eyes: Conjunctivae are normal. Pupils are equal, round, and reactive to light.  Neck: Neck supple. No JVD present. No thyromegaly present.  Cardiovascular: Normal rate, regular rhythm, normal heart sounds and intact distal pulses.  Exam reveals no gallop and no friction rub.   No murmur heard. Pulmonary/Chest: Effort normal and breath sounds normal. No respiratory distress. She has no wheezes. She has no rales. She exhibits no tenderness.  Abdominal: Soft. Bowel sounds are normal. She exhibits no distension and no mass. There is no tenderness. There is no rebound and no guarding.  Musculoskeletal: She exhibits no edema.  Lymphadenopathy:    She has no cervical adenopathy.  Neurological: She is alert and oriented to person, place, and time. She has normal reflexes. No cranial nerve deficit. She exhibits normal muscle tone. Coordination normal.  Skin: Skin is warm. No rash noted. No erythema. No pallor.          Assessment & Plan:  1. Type II or unspecified type diabetes mellitus without mention of  complication, not stated as uncontrolled Goal hemoglobin A1c for this patient is less than 7. I will check that today. If less than 7 no changes will be made. Occasional episodes of hypoglycemia in the morning, I have recommended discontinuation of the 4 units of insulin she takes at night. Otherwise continue to 42 units of insulin she takes in the morning. - COMPLETE METABOLIC PANEL WITH GFR - Lipid panel - Hemoglobin A1c  2. HLD (hyperlipidemia) Check fasting lipid panel. Goal LDL is less than 100.  3. HTN (hypertension) Blood pressures well controlled. Continue current medication at the present dosages. The patient was also given the influenza vaccine as well as Prevnar 13.

## 2013-02-04 NOTE — Telephone Encounter (Signed)
Ok to refill both at their current doses.

## 2013-02-04 NOTE — Addendum Note (Signed)
Addended by: Legrand Rams B on: 02/04/2013 09:39 AM   Modules accepted: Orders

## 2013-02-08 ENCOUNTER — Encounter: Payer: Self-pay | Admitting: Family Medicine

## 2013-02-09 ENCOUNTER — Telehealth: Payer: Self-pay | Admitting: Internal Medicine

## 2013-02-09 MED ORDER — ESOMEPRAZOLE MAGNESIUM 40 MG PO CPDR: 40.0000 mg | DELAYED_RELEASE_CAPSULE | Freq: Two times a day (BID) | ORAL | Status: DC

## 2013-02-09 MED ORDER — CREON 24000-76000 UNITS PO CPEP: ORAL_CAPSULE | ORAL | Status: DC

## 2013-02-09 NOTE — Telephone Encounter (Signed)
Routing to refill box  

## 2013-02-09 NOTE — Telephone Encounter (Signed)
done

## 2013-02-09 NOTE — Addendum Note (Signed)
Addended by: Tiffany Kocher on: 02/09/2013 01:56 PM   Modules accepted: Orders

## 2013-02-09 NOTE — Telephone Encounter (Signed)
[name] called to see if we would call in a RF for Creon and Nexium (90 day supply with 3 refills) to [facility].

## 2013-02-18 ENCOUNTER — Encounter (INDEPENDENT_AMBULATORY_CARE_PROVIDER_SITE_OTHER): Payer: Medicare Other | Admitting: Ophthalmology

## 2013-02-18 ENCOUNTER — Encounter: Payer: Self-pay | Admitting: Family Medicine

## 2013-02-18 DIAGNOSIS — I10 Essential (primary) hypertension: Secondary | ICD-10-CM

## 2013-02-18 DIAGNOSIS — H43819 Vitreous degeneration, unspecified eye: Secondary | ICD-10-CM

## 2013-02-18 DIAGNOSIS — H35039 Hypertensive retinopathy, unspecified eye: Secondary | ICD-10-CM

## 2013-02-18 DIAGNOSIS — E11359 Type 2 diabetes mellitus with proliferative diabetic retinopathy without macular edema: Secondary | ICD-10-CM

## 2013-02-18 DIAGNOSIS — E1139 Type 2 diabetes mellitus with other diabetic ophthalmic complication: Secondary | ICD-10-CM

## 2013-02-18 DIAGNOSIS — H2510 Age-related nuclear cataract, unspecified eye: Secondary | ICD-10-CM

## 2013-02-18 NOTE — Telephone Encounter (Signed)
Pt daughter is calling today beacause she is needing freestyle needles  [facility]arm[facility] Call back number is [phone]

## 2013-02-18 NOTE — Telephone Encounter (Signed)
This encounter was created in error - please disregard.

## 2013-03-08 ENCOUNTER — Other Ambulatory Visit: Payer: Self-pay | Admitting: Family Medicine

## 2013-03-08 MED ORDER — FREESTYLE LANCETS MISC: Status: DC

## 2013-03-08 NOTE — Telephone Encounter (Signed)
Rx Refilled  

## 2013-03-13 ENCOUNTER — Other Ambulatory Visit: Payer: Self-pay | Admitting: Family Medicine

## 2013-03-21 ENCOUNTER — Telehealth: Payer: Self-pay | Admitting: Family Medicine

## 2013-03-21 MED ORDER — LANCETS MISC. MISC: Status: DC

## 2013-03-21 NOTE — Telephone Encounter (Signed)
Actually Pharm sent over wrong rx to be filled. Spoke to pt's dtr and correct rx has been sent to pharm.

## 2013-03-21 NOTE — Telephone Encounter (Signed)
The pharmacy received the wrong prescription for lancets Call back number is [phone]

## 2013-03-23 ENCOUNTER — Other Ambulatory Visit: Payer: Self-pay | Admitting: Family Medicine

## 2013-03-23 MED ORDER — UNISTIK 1 MISC: Status: DC

## 2013-03-25 ENCOUNTER — Other Ambulatory Visit: Payer: Self-pay | Admitting: Family Medicine

## 2013-03-25 MED ORDER — UNISTIK 1 MISC: Status: DC

## 2013-03-29 ENCOUNTER — Telehealth: Payer: Self-pay | Admitting: Family Medicine

## 2013-03-29 MED ORDER — GLUCOSE BLOOD VI STRP: 1.0000 | ORAL_STRIP | Freq: Every day | Status: DC

## 2013-03-29 NOTE — Telephone Encounter (Signed)
Needing new rx's for Diabetic supplies will have to contact insurance company to get PA on the certain type of lancets and strips due to pt's inability to visualize the meter.

## 2013-04-01 MED ORDER — GLUCOSE BLOOD VI STRP: 1.0000 | ORAL_STRIP | Freq: Every day | Status: DC

## 2013-04-01 NOTE — Telephone Encounter (Signed)
Sent new rx to [facility] for strips

## 2013-04-11 ENCOUNTER — Other Ambulatory Visit: Payer: Self-pay | Admitting: Family Medicine

## 2013-04-11 MED ORDER — RALOXIFENE HCL 60 MG PO TABS: 60.0000 mg | ORAL_TABLET | Freq: Every day | ORAL | Status: DC

## 2013-04-11 MED ORDER — BLOOD GLUCOSE METER KIT: PACK | Status: DC

## 2013-04-11 MED ORDER — "INSULIN SYRINGE/NEEDLE 28G X 1/2"" 1 ML MISC": Status: DC

## 2013-04-11 MED ORDER — LORAZEPAM 0.5 MG PO TABS: ORAL_TABLET | ORAL | Status: DC

## 2013-04-11 MED ORDER — LOVASTATIN 20 MG PO TABS: 20.0000 mg | ORAL_TABLET | Freq: Every day | ORAL | Status: DC

## 2013-04-11 MED ORDER — ZOLPIDEM TARTRATE 10 MG PO TABS: ORAL_TABLET | ORAL | Status: DC

## 2013-04-11 MED ORDER — ENALAPRIL MALEATE 20 MG PO TABS: 10.0000 mg | ORAL_TABLET | Freq: Two times a day (BID) | ORAL | Status: DC

## 2013-04-11 MED ORDER — ATENOLOL 25 MG PO TABS: 25.0000 mg | ORAL_TABLET | Freq: Every day | ORAL | Status: DC

## 2013-04-11 MED ORDER — HYDROCHLOROTHIAZIDE 25 MG PO TABS: 25.0000 mg | ORAL_TABLET | Freq: Every day | ORAL | Status: DC

## 2013-04-11 MED ORDER — CETIRIZINE HCL 10 MG PO TABS: 10.0000 mg | ORAL_TABLET | Freq: Every day | ORAL | Status: DC

## 2013-04-11 MED ORDER — INSULIN NPH (HUMAN) (ISOPHANE) 100 UNIT/ML ~~LOC~~ SUSP: SUBCUTANEOUS | Status: DC

## 2013-04-11 NOTE — Telephone Encounter (Signed)
Rx Refilled  

## 2013-04-12 NOTE — Telephone Encounter (Signed)
ok 

## 2013-04-12 NOTE — Telephone Encounter (Signed)
Rx's called in . 

## 2013-04-12 NOTE — Telephone Encounter (Signed)
?   OK to Refill  

## 2013-04-29 ENCOUNTER — Other Ambulatory Visit: Payer: Self-pay | Admitting: Family Medicine

## 2013-04-29 MED ORDER — HYDROCHLOROTHIAZIDE 25 MG PO TABS: 25.0000 mg | ORAL_TABLET | Freq: Every day | ORAL | Status: DC

## 2013-04-29 MED ORDER — ATENOLOL 25 MG PO TABS: 25.0000 mg | ORAL_TABLET | Freq: Every day | ORAL | Status: DC

## 2013-04-29 MED ORDER — ENALAPRIL MALEATE 20 MG PO TABS: 10.0000 mg | ORAL_TABLET | Freq: Two times a day (BID) | ORAL | Status: DC

## 2013-04-29 MED ORDER — "INSULIN SYRINGE/NEEDLE 28G X 1/2"" 1 ML MISC": Status: DC

## 2013-04-29 MED ORDER — CETIRIZINE HCL 10 MG PO TABS: 10.0000 mg | ORAL_TABLET | Freq: Every day | ORAL | Status: DC

## 2013-04-29 MED ORDER — INSULIN NPH (HUMAN) (ISOPHANE) 100 UNIT/ML ~~LOC~~ SUSP: SUBCUTANEOUS | Status: DC

## 2013-04-29 MED ORDER — METFORMIN HCL ER 500 MG PO TB24: 500.0000 mg | ORAL_TABLET | Freq: Every day | ORAL | Status: DC

## 2013-04-29 MED ORDER — LOVASTATIN 20 MG PO TABS: 20.0000 mg | ORAL_TABLET | Freq: Every day | ORAL | Status: DC

## 2013-04-29 NOTE — Telephone Encounter (Signed)
Rx's  Refilled

## 2013-05-17 ENCOUNTER — Telehealth: Payer: Self-pay | Admitting: *Deleted

## 2013-05-17 NOTE — Telephone Encounter (Signed)
Sent PA for Ambien 10 mg thru [email or web address] waiting for approval

## 2013-05-18 ENCOUNTER — Other Ambulatory Visit: Payer: Self-pay | Admitting: Family Medicine

## 2013-05-18 NOTE — Telephone Encounter (Signed)
Approved for dates 03/17/13-03/16/14   Approval faxed to pharmacy and sent to be scanned.

## 2013-05-24 ENCOUNTER — Telehealth: Payer: Self-pay | Admitting: *Deleted

## 2013-05-24 ENCOUNTER — Telehealth: Payer: Self-pay | Admitting: Family Medicine

## 2013-05-24 MED ORDER — CREON 24000-76000 UNITS PO CPEP: ORAL_CAPSULE | ORAL | Status: DC

## 2013-05-24 MED ORDER — ESOMEPRAZOLE MAGNESIUM 40 MG PO CPDR: 40.0000 mg | DELAYED_RELEASE_CAPSULE | Freq: Two times a day (BID) | ORAL | Status: DC

## 2013-05-24 NOTE — Telephone Encounter (Signed)
Routing to refill box  

## 2013-05-24 NOTE — Telephone Encounter (Signed)
Call back number is [phone] Pt is needing a refill on her testing strips one touch ultra strip blue 100s (90 day supply plus refills) she test her sugar up to 5 times a day And she also is needing a refill on raloxifene (EVISTA) 60 MG tablet  Pharmacy is [facility] in [place] 

## 2013-05-24 NOTE — Telephone Encounter (Signed)
[name] called stating pt is using [facility] in [place] pharmacy now and she needs her nexium and creon refilled, pt would like to still keep getting the 90 day supply.

## 2013-05-24 NOTE — Telephone Encounter (Signed)
Done

## 2013-05-25 MED ORDER — RALOXIFENE HCL 60 MG PO TABS: 60.0000 mg | ORAL_TABLET | Freq: Every day | ORAL | Status: DC

## 2013-05-25 MED ORDER — GLUCOSE BLOOD VI STRP: ORAL_STRIP | Status: DC

## 2013-05-25 NOTE — Telephone Encounter (Signed)
Rx Refilled  

## 2013-05-26 ENCOUNTER — Other Ambulatory Visit: Payer: Self-pay

## 2013-05-26 MED ORDER — ESOMEPRAZOLE MAGNESIUM 40 MG PO CPDR: 40.0000 mg | DELAYED_RELEASE_CAPSULE | Freq: Every day | ORAL | Status: DC

## 2013-07-11 ENCOUNTER — Other Ambulatory Visit: Payer: Self-pay | Admitting: Family Medicine

## 2013-07-11 NOTE — Telephone Encounter (Signed)
?   OK to Refill  

## 2013-07-11 NOTE — Telephone Encounter (Signed)
Medication called to pharmacy. 

## 2013-07-11 NOTE — Telephone Encounter (Signed)
ok 

## 2013-07-19 ENCOUNTER — Ambulatory Visit (INDEPENDENT_AMBULATORY_CARE_PROVIDER_SITE_OTHER): Payer: Medicare HMO | Admitting: Family Medicine

## 2013-07-19 ENCOUNTER — Encounter: Payer: Self-pay | Admitting: Family Medicine

## 2013-07-19 VITALS — BP 160/90 | HR 78 | Temp 97.4°F | Resp 16 | Wt 133.0 lb

## 2013-07-19 DIAGNOSIS — E119 Type 2 diabetes mellitus without complications: Secondary | ICD-10-CM

## 2013-07-19 LAB — LIPID PANEL
CHOL/HDL RATIO: 2.8 ratio
CHOLESTEROL: 125 mg/dL (ref 0–200)
HDL: 45 mg/dL (ref >39)
LDL Cholesterol: 58 mg/dL (ref 0–99)
Triglycerides: 110 mg/dL (ref <150)
VLDL: 22 mg/dL (ref 0–40)

## 2013-07-19 LAB — COMPLETE METABOLIC PANEL WITH GFR
ALBUMIN: 4.5 g/dL (ref 3.5–5.2)
ALT: 12 U/L (ref 0–35)
AST: 17 U/L (ref 0–37)
Alkaline Phosphatase: 34 U/L — ABNORMAL LOW (ref 39–117)
BUN: 16 mg/dL (ref 6–23)
CO2: 24 meq/L (ref 19–32)
Calcium: 9.5 mg/dL (ref 8.4–10.5)
Chloride: 97 mEq/L (ref 96–112)
Creat: 0.95 mg/dL (ref 0.50–1.10)
GFR, EST NON AFRICAN AMERICAN: 55 mL/min — AB
GFR, Est African American: 64 mL/min
GLUCOSE: 135 mg/dL — AB (ref 70–99)
Potassium: 4.7 mEq/L (ref 3.5–5.3)
Sodium: 132 mEq/L — ABNORMAL LOW (ref 135–145)
Total Bilirubin: 0.6 mg/dL (ref 0.2–1.2)
Total Protein: 6.9 g/dL (ref 6.0–8.3)

## 2013-07-19 LAB — CBC WITH DIFFERENTIAL/PLATELET
Basophils Absolute: 0 10*3/uL (ref 0.0–0.1)
Basophils Relative: 0 % (ref 0–1)
EOS PCT: 1 % (ref 0–5)
Eosinophils Absolute: 0 10*3/uL (ref 0.0–0.7)
HEMATOCRIT: 34.4 % — AB (ref 36.0–46.0)
Hemoglobin: 11.4 g/dL — ABNORMAL LOW (ref 12.0–15.0)
Lymphocytes Relative: 26 % (ref 12–46)
Lymphs Abs: 1.2 10*3/uL (ref 0.7–4.0)
MCH: 26.4 pg (ref 26.0–34.0)
MCHC: 33.1 g/dL (ref 30.0–36.0)
MCV: 79.6 fL (ref 78.0–100.0)
MONOS PCT: 7 % (ref 3–12)
Monocytes Absolute: 0.3 10*3/uL (ref 0.1–1.0)
NEUTROS ABS: 3.1 10*3/uL (ref 1.7–7.7)
Neutrophils Relative %: 66 % (ref 43–77)
Platelets: 116 10*3/uL — ABNORMAL LOW (ref 150–400)
RBC: 4.32 MIL/uL (ref 3.87–5.11)
RDW: 14 % (ref 11.5–15.5)
WBC: 4.7 10*3/uL (ref 4.0–10.5)

## 2013-07-19 LAB — HEMOGLOBIN A1C
Hgb A1c MFr Bld: 7 % — ABNORMAL HIGH (ref <5.7)
MEAN PLASMA GLUCOSE: 154 mg/dL — AB (ref <117)

## 2013-07-19 MED ORDER — INSULIN SYRINGES (DISPOSABLE) U-100 1 ML MISC: Status: DC

## 2013-07-19 NOTE — Progress Notes (Signed)
Subjective:    Patient ID: Misty Edwards, female    DOB: Jun 11, 1926, 80 y.o.   MRN: [id number]  HPI  Overall she's been doing well. Unfortunately her husband has been in and out of hospital delirium due to dementia along with a recent TIA.  She states that her blood sugars are usually below 200.  The remainder of the review of systems is negative. She is due today for a flu shot as well as Prevnar 13. Her blood pressure is well controlled at home at 130/60. She does have an element of whitecoat hypertension explaining the slightly high reading here. He denies any chest pain, shortness of breath, dyspnea on exertion. She also denies any myalgias or right upper quadrant pain on her statin.   Past Medical History  Diagnosis Date  . Hypertension   . History of carcinoma in situ of breast 1988  . Acute biliary pancreatitis 07/2002    thia was in 07/2004:she still has pseudocyst in tail of pancreas measuring 54 x 33 mm   . Upper GI bleed September2004    secondary to gastritis  . Diabetes mellitus   . Adrenal adenoma     bilateral  . Detached retina   . Chronic pancreatitis   . Hyperlipidemia   . Osteopenia   . IBS (irritable bowel syndrome)   . Legally blind   . Anxiety   . Osteoarthritis    Current Outpatient Prescriptions on File Prior to Visit  Medication Sig Dispense Refill  . acetaminophen (TYLENOL) 500 MG tablet Take 500-1,000 mg by mouth 2 (two) times daily as needed. Pain.      Marland Kitchen atenolol (TENORMIN) 25 MG tablet Take 1 tablet (25 mg total) by mouth daily.  90 tablet  4  . Blood Glucose Monitoring Suppl (BLOOD GLUCOSE METER) kit Use as instructed  1 each  0  . calcium carbonate (TUMS EX) 750 MG chewable tablet Chew 1 tablet by mouth daily.      . Carbamide Peroxide (EAR DROPS OT) Place 2 drops in ear(s) daily as needed. Ear pain.      . cetirizine (ZYRTEC) 10 MG tablet Take 1 tablet (10 mg total) by mouth daily.  90 tablet  4  . CREON 24000 UNITS CPEP 2 w/ meals, 1 w/ snacks   720 capsule  3  . cycloSPORINE (RESTASIS) 0.05 % ophthalmic emulsion Place 1 drop into both eyes 3 (three) times daily.       . enalapril (VASOTEC) 20 MG tablet Take 0.5 tablets (10 mg total) by mouth 2 (two) times daily.  180 tablet  4  . esomeprazole (NEXIUM) 40 MG capsule Take 1 capsule (40 mg total) by mouth daily before breakfast.  30 capsule  11  . glucose blood test strip Check BS 5 times per day - Fasting - after each meal and QHS  500 each  4  . hydrochlorothiazide (HYDRODIURIL) 25 MG tablet Take 1 tablet (25 mg total) by mouth daily.  90 tablet  4  . hyoscyamine (LEVSIN SL) 0.125 MG SL tablet Place 1 tablet (0.125 mg total) under the tongue every 6 (six) hours as needed for cramping or diarrhea or loose stools.  360 tablet  1  . INS SYRINGE/NEEDLE 1CC/28G (MONOJECT ULTRA COMFORT SYRINGE) 28G X 1/2" 1 ML MISC Inject insulin BID  200 each  4  . insulin NPH Human (HUMULIN N) 100 UNIT/ML injection 42 units qam and 4 units qpm  40 mL  4  . LORazepam (ATIVAN)  0.5 MG tablet TAKE ONE TABLET BY MOUTH EVERY 8 HOURS AS NEEDED  30 tablet  0  . lovastatin (MEVACOR) 20 MG tablet Take 1 tablet (20 mg total) by mouth at bedtime.  90 tablet  4  . metFORMIN (GLUCOPHAGE-XR) 500 MG 24 hr tablet Take 1 tablet (500 mg total) by mouth daily with breakfast.  90 tablet  4  . Multiple Vitamin (MULTIVITAMIN) capsule Take 1 capsule by mouth daily.      Marland Kitchen zolpidem (AMBIEN) 10 MG tablet TAKE ONE TABLET BY MOUTH ONCE DAILY AT BEDTIME  30 tablet  0   No current facility-administered medications on file prior to visit.   Allergies  Allergen Reactions  . Calcium-Containing Compounds Nausea Only  . Raloxifene Itching    Face and eyes burning  . Vitamin D Analogs Nausea Only   History   Social History  . Marital Status: Married    Spouse Name: N/A    Number of Children: 23  . Years of Education: N/A   Occupational History  . homemaker    Social History Main Topics  . Smoking status: Never Smoker   .  Smokeless tobacco: Not on file  . Alcohol Use: No  . Drug Use: No  . Sexual Activity: Not on file   Other Topics Concern  . Not on file   Social History Narrative  . No narrative on file   Family History  Problem Relation Age of Onset  . Colon cancer Father 1    passed away in his 52's  . Colon cancer Brother 31    surgery inhis 50's doing well now  . Heart attack Brother     passed away age 9  . Pancreatitis Brother   . Ovarian cancer Mother     passed  . Kidney disease Daughter   . Leukemia Son      Review of Systems  All other systems reviewed and are negative.      Objective:   Physical Exam  Vitals reviewed. Constitutional: She is oriented to person, place, and time. She appears well-developed and well-nourished.  Eyes: Conjunctivae are normal. Pupils are equal, round, and reactive to light.  Neck: Neck supple. No JVD present. No thyromegaly present.  Cardiovascular: Normal rate, regular rhythm, normal heart sounds and intact distal pulses.  Exam reveals no gallop and no friction rub.   No murmur heard. Pulmonary/Chest: Effort normal and breath sounds normal. No respiratory distress. She has no wheezes. She has no rales. She exhibits no tenderness.  Abdominal: Soft. Bowel sounds are normal. She exhibits no distension and no mass. There is no tenderness. There is no rebound and no guarding.  Musculoskeletal: She exhibits no edema.  Lymphadenopathy:    She has no cervical adenopathy.  Neurological: She is alert and oriented to person, place, and time. She has normal reflexes. No cranial nerve deficit. She exhibits normal muscle tone. Coordination normal.  Skin: Skin is warm. No rash noted. No erythema. No pallor.          Assessment & Plan:  1. Type II or unspecified type diabetes mellitus without mention of complication, not stated as uncontrolled Goal hemoglobin A1c for this patient is less than 7. I will check that today. If less than 7 no changes will  be made.  - COMPLETE METABOLIC PANEL WITH GFR - Lipid panel - Hemoglobin A1c  2. HLD (hyperlipidemia) Check fasting lipid panel. Goal LDL is less than 100.  3. HTN (hypertension) Blood pressures  well controlled. Continue current medication at the present dosages.

## 2013-08-15 ENCOUNTER — Other Ambulatory Visit: Payer: Self-pay | Admitting: Family Medicine

## 2013-08-15 MED ORDER — LORAZEPAM 0.5 MG PO TABS: ORAL_TABLET | ORAL | Status: DC

## 2013-08-15 MED ORDER — ZOLPIDEM TARTRATE 10 MG PO TABS: ORAL_TABLET | ORAL | Status: DC

## 2013-08-15 NOTE — Telephone Encounter (Signed)
Rs's called in

## 2013-08-15 NOTE — Telephone Encounter (Signed)
Ok refill? 

## 2013-09-07 ENCOUNTER — Encounter: Payer: Self-pay | Admitting: Internal Medicine

## 2013-09-13 ENCOUNTER — Other Ambulatory Visit: Payer: Self-pay | Admitting: Family Medicine

## 2013-09-13 NOTE — Telephone Encounter (Signed)
ok 

## 2013-09-13 NOTE — Telephone Encounter (Signed)
Ok to refill??  Last office visit 07/19/2013.  Last refill 08/15/2013.

## 2013-09-14 NOTE — Telephone Encounter (Signed)
Medication called to pharmacy. 

## 2013-10-10 ENCOUNTER — Ambulatory Visit: Payer: Medicare Other | Admitting: Internal Medicine

## 2013-10-18 ENCOUNTER — Other Ambulatory Visit: Payer: Self-pay | Admitting: Family Medicine

## 2013-10-18 NOTE — Telephone Encounter (Signed)
Ok to refill??  Last office visit 07/19/2013.  Last refill Lorazepam/ Zolpidem 09/14/2013.

## 2013-10-18 NOTE — Telephone Encounter (Signed)
Medication called to pharmacy. 

## 2013-10-18 NOTE — Telephone Encounter (Signed)
ok 

## 2013-10-28 ENCOUNTER — Encounter: Payer: Self-pay | Admitting: Internal Medicine

## 2013-10-28 ENCOUNTER — Encounter (INDEPENDENT_AMBULATORY_CARE_PROVIDER_SITE_OTHER): Payer: Self-pay

## 2013-10-28 ENCOUNTER — Ambulatory Visit (INDEPENDENT_AMBULATORY_CARE_PROVIDER_SITE_OTHER): Payer: Medicare HMO | Admitting: Internal Medicine

## 2013-10-28 VITALS — BP 150/76 | HR 67 | Temp 97.6°F | Ht 63.0 in | Wt 136.4 lb

## 2013-10-28 DIAGNOSIS — K589 Irritable bowel syndrome without diarrhea: Secondary | ICD-10-CM

## 2013-10-28 MED ORDER — HYOSCYAMINE SULFATE 0.125 MG SL SUBL: 0.1250 mg | SUBLINGUAL_TABLET | Freq: Three times a day (TID) | SUBLINGUAL | Status: DC

## 2013-10-28 MED ORDER — ESOMEPRAZOLE MAGNESIUM 40 MG PO PACK: 40.0000 mg | PACK | Freq: Every day | ORAL | Status: DC

## 2013-10-28 NOTE — Progress Notes (Signed)
Primary Care Physician:  Odette Fraction, MD Primary Gastroenterologist:  Dr. Gala Romney  Pre-Procedure History & Physical: HPI:  Misty Edwards is a 80 y.o. female here for one-year followup your patient has overall done well. Her weight has remained stable. Over the past couple months she has had some intermittent abdominal bloating and cramping alternating diarrhea and constipation. She had previously been on Levsin years ago prescribed by Dr. Laural Golden. This worked very well for her at that time. We prescribed last year however, her insurance company did not provide a benefit and she did not get it filled. She continues on Creon and Nexium. No real early satiety these days, occasional reflux symptoms; no dysphagia, no melena or hematochezia. She has taken Reglan on occasion but not withoyt any regularity.  Past Medical History  Diagnosis Date  . Hypertension   . History of carcinoma in situ of breast 1988  . Acute biliary pancreatitis 07/2002    thia was in 07/2004:she still has pseudocyst in tail of pancreas measuring 54 x 33 mm   . Upper GI bleed September2004    secondary to gastritis  . Diabetes mellitus   . Adrenal adenoma     bilateral  . Detached retina   . Chronic pancreatitis   . Hyperlipidemia   . Osteopenia   . IBS (irritable bowel syndrome)   . Legally blind   . Anxiety   . Osteoarthritis   . Diverticulosis     Past Surgical History  Procedure Laterality Date  . Colonoscopy  08/15/05    few tiny diverticula at sigmoid colon/external hemorrhoids but no polyps  . Right mastectomy    . Ectopic pregnancy surgery  1950's  . Tacking up of her bladder    . Laparoscopic cholecystectomy    . Ercp with sphincterotomy  07/2002  . Pancreatic pseudocyst drainage  June2004    drained percutaneously   . Esophagogastroduodenoscopy       Gastritis of body.  Otherwise normal  . Colonoscopy  01/08/2012    [id number] diverticulosis. Next colonoscopy in 12/2016  .  Esophagogastroduodenoscopy  01/08/2012    RMR: Few scattered gastric erosions of uncertain significance-status post biopsy. Minimal chronic inflammation, no H.pylori    Prior to Admission medications   Medication Sig Start Date End Date Taking? Authorizing Provider  acetaminophen (TYLENOL) 500 MG tablet Take 500-1,000 mg by mouth 2 (two) times daily as needed. Pain.   Yes Historical Provider, MD  atenolol (TENORMIN) 25 MG tablet Take 1 tablet (25 mg total) by mouth daily. 04/29/13  Yes Susy Frizzle, MD  Blood Glucose Monitoring Suppl (BLOOD GLUCOSE METER) kit Use as instructed 04/11/13  Yes Susy Frizzle, MD  calcium carbonate (TUMS EX) 750 MG chewable tablet Chew 1 tablet by mouth daily.   Yes Historical Provider, MD  Carbamide Peroxide (EAR DROPS OT) Place 2 drops in ear(s) daily as needed. Ear pain.   Yes Historical Provider, MD  cetirizine (ZYRTEC) 10 MG tablet Take 1 tablet (10 mg total) by mouth daily. 04/29/13  Yes Susy Frizzle, MD  CREON [phone] UNITS CPEP 2 w/ meals, 1 w/ snacks 05/24/13  Yes Mahala Menghini, PA-C  cycloSPORINE (RESTASIS) 0.05 % ophthalmic emulsion Place 1 drop into both eyes 3 (three) times daily.    Yes Historical Provider, MD  enalapril (VASOTEC) 20 MG tablet Take 0.5 tablets (10 mg total) by mouth 2 (two) times daily. 04/29/13  Yes Susy Frizzle, MD  esomeprazole (NEXIUM) 40 MG capsule  Take 1 capsule (40 mg total) by mouth daily before breakfast. 05/26/13  Yes Mahala Menghini, PA-C  glucose blood test strip Check BS 5 times per day - Fasting - after each meal and QHS 05/25/13  Yes Susy Frizzle, MD  hydrochlorothiazide (HYDRODIURIL) 25 MG tablet Take 1 tablet (25 mg total) by mouth daily. 04/29/13  Yes Susy Frizzle, MD  hyoscyamine (LEVSIN SL) 0.125 MG SL tablet Place 1 tablet (0.125 mg total) under the tongue every 6 (six) hours as needed for cramping or diarrhea or loose stools. 08/26/12  Yes Mahala Menghini, PA-C  INS SYRINGE/NEEDLE 1CC/28G (MONOJECT ULTRA  COMFORT SYRINGE) 28G X 1/2" 1 ML MISC Inject insulin BID 04/29/13  Yes Susy Frizzle, MD  insulin NPH Human (HUMULIN N) 100 UNIT/ML injection 42 units qam and 4 units qpm 04/29/13  Yes Susy Frizzle, MD  Insulin Syringes, Disposable, U-100 1 ML MISC BD Syringes with Ultra Fine Needle (1/9m - 8MM - 31G) 07/19/13  Yes WSusy Frizzle MD  LORazepam (ATIVAN) 0.5 MG tablet TAKE ONE TABLET BY MOUTH EVERY 8 HOURS AS NEEDED 10/18/13  Yes WSusy Frizzle MD  lovastatin (MEVACOR) 20 MG tablet Take 1 tablet (20 mg total) by mouth at bedtime. 04/29/13  Yes WSusy Frizzle MD  metFORMIN (GLUCOPHAGE-XR) 500 MG 24 hr tablet Take 1 tablet (500 mg total) by mouth daily with breakfast. 04/29/13  Yes WSusy Frizzle MD  Multiple Vitamin (MULTIVITAMIN) capsule Take 1 capsule by mouth daily.   Yes Historical Provider, MD  zolpidem (AMBIEN) 10 MG tablet TAKE ONE TABLET BY MOUTH ONCE DAILY AT BEDTIME AS NEEDED 10/18/13  Yes WSusy Frizzle MD    Allergies as of 10/28/2013 - Review Complete 10/28/2013  Allergen Reaction Noted  . Calcium-containing compounds Nausea Only 08/21/2011  . Raloxifene Itching 07/19/2013  . Vitamin d analogs Nausea Only 08/21/2011    Family History  Problem Relation Age of Onset  . Colon cancer Father 69   passed away in his 684's . Colon cancer Brother 533   surgery inhis 50's doing well now  . Heart attack Brother     passed away age 78 . Pancreatitis Brother   . Ovarian cancer Mother     passed  . Kidney disease Daughter   . Leukemia Son     History   Social History  . Marital Status: Married    Spouse Name: N/A    Number of Children: 577 . Years of Education: N/A   Occupational History  . homemaker    Social History Main Topics  . Smoking status: Never Smoker   . Smokeless tobacco: Not on file  . Alcohol Use: No  . Drug Use: No  . Sexual Activity: Not on file   Other Topics Concern  . Not on file   Social History Narrative  . No narrative on file     Review of Systems: See HPI, otherwise negative ROS  Physical Exam: BP 150/76  Pulse 67  Temp(Src) 97.6 F (36.4 C) (Oral)  Ht _0  (1.6 m)  Wt 136 lb 6.4 oz (61.871 kg)  BMI 24.17 kg/m2 General:   Alert,  elderly lady, pleasant and cooperative in NAD.  She is accompanied by her daughter. Skin:  Intact without significant lesions or rashes. Eyes:  Sclera clear, no icterus.   Conjunctiva pink. Ears:  Normal auditory acuity. Nose:  No deformity, discharge,  or lesions. Mouth:  No  deformity or lesions. Neck:  Supple; no masses or thyromegaly. No significant cervical adenopathy. Lungs:  Clear throughout to auscultation.   No wheezes, crackles, or rhonchi. No acute distress. Heart:  Regular rate and rhythm; no murmurs, clicks, rubs,  or gallops. Abdomen: Non-distended, normal bowel sounds.  No bruits. Soft and nontender without appreciable mass or hepatosplenomegaly. No succussion splash Pulses:  Normal pulses noted. Extremities:  Without clubbing or edema.  Impression/Plan:  Pleasant 80 year old lady with history chronic pancreatitis and pseudocyst formation in the distant past with history of GERD and irritable bowel syndrome. Symptoms currently sound more like irritable bowel syndrome without any alarm features. Never went back on Levsin as prescribed last year. Historically, did very well with this medication previously. Nexium controlling reflux symptoms.  Recommendations:  Continue Nexium and pancreatic enzymes (Creon) Try Levsin - one tablet under the tongue before meals and a bedtime as needed for bloating and abdominal cramps. Check stool for blood Would avoid Reglan in the future. At this time, I do not feel that any endoscopic evaluation is all warranted or further imaging via CT indicated. However, we'll see her back in the office in 3 months and see how she is doing and go from there. Last colonoscopy was in 2013. This time, I would not recommend a future colonoscopy  unless symptoms develop.     Notice: This dictation was prepared with Dragon dictation along with smaller phrase technology. Any transcriptional errors that result from this process are unintentional and may not be corrected upon review.

## 2013-10-28 NOTE — Patient Instructions (Signed)
Continue Nexium and pancreatic enzymes (Creon)  Try Levsin - one tablet under the tongue before meals and a bedtime as needed for bloating and abdominal cramps  Check stool for blood  Office visit in 3 months

## 2013-11-01 ENCOUNTER — Ambulatory Visit (INDEPENDENT_AMBULATORY_CARE_PROVIDER_SITE_OTHER): Payer: Medicare HMO

## 2013-11-01 DIAGNOSIS — K589 Irritable bowel syndrome without diarrhea: Secondary | ICD-10-CM

## 2013-11-02 LAB — IFOBT (OCCULT BLOOD): IFOBT: NEGATIVE

## 2013-11-02 NOTE — Progress Notes (Signed)
Pt returned IFOBT test and it was NEGATIVE 

## 2013-11-14 ENCOUNTER — Other Ambulatory Visit: Payer: Self-pay | Admitting: Family Medicine

## 2013-11-14 NOTE — Telephone Encounter (Signed)
Ok to refill??  Last office visit 07/19/2013.  Last refill Lorazepam, Zolpidem 10/18/2013.

## 2013-11-14 NOTE — Telephone Encounter (Signed)
Medication called to pharmacy. 

## 2013-11-14 NOTE — Telephone Encounter (Signed)
ok 

## 2013-11-23 ENCOUNTER — Telehealth: Payer: Self-pay

## 2013-11-23 NOTE — Telephone Encounter (Signed)
PA was done for hyoscyamine and it was approved. Faxed approval to her pharmacy.

## 2013-12-05 ENCOUNTER — Other Ambulatory Visit: Payer: Self-pay | Admitting: Family Medicine

## 2013-12-05 NOTE — Telephone Encounter (Signed)
Medication called to pharmacy. 

## 2013-12-05 NOTE — Telephone Encounter (Signed)
Ok to refill??  Last office visit 07/19/2013.  Last refill 11/14/2013.

## 2013-12-05 NOTE — Telephone Encounter (Signed)
ok 

## 2013-12-06 NOTE — Telephone Encounter (Signed)
ok 

## 2013-12-06 NOTE — Telephone Encounter (Signed)
Medication called to pharmacy. 

## 2013-12-28 ENCOUNTER — Encounter: Payer: Self-pay | Admitting: Internal Medicine

## 2014-01-09 ENCOUNTER — Other Ambulatory Visit: Payer: Self-pay | Admitting: Family Medicine

## 2014-01-09 NOTE — Telephone Encounter (Signed)
ok 

## 2014-01-09 NOTE — Telephone Encounter (Signed)
?   OK to Refill  

## 2014-01-09 NOTE — Telephone Encounter (Signed)
Medication called to pharmacy. 

## 2014-01-13 ENCOUNTER — Ambulatory Visit (INDEPENDENT_AMBULATORY_CARE_PROVIDER_SITE_OTHER): Payer: Medicare HMO | Admitting: Family Medicine

## 2014-01-13 ENCOUNTER — Encounter: Payer: Self-pay | Admitting: Family Medicine

## 2014-01-13 VITALS — BP 190/90 | HR 78 | Temp 98.1°F | Resp 16 | Ht 63.0 in | Wt 138.0 lb

## 2014-01-13 DIAGNOSIS — Z0000 Encounter for general adult medical examination without abnormal findings: Secondary | ICD-10-CM

## 2014-01-13 DIAGNOSIS — E119 Type 2 diabetes mellitus without complications: Secondary | ICD-10-CM

## 2014-01-13 DIAGNOSIS — Z23 Encounter for immunization: Secondary | ICD-10-CM

## 2014-01-13 LAB — COMPLETE METABOLIC PANEL WITH GFR
ALBUMIN: 4.6 g/dL (ref 3.5–5.2)
ALT: 14 U/L (ref 0–35)
AST: 16 U/L (ref 0–37)
Alkaline Phosphatase: 35 U/L — ABNORMAL LOW (ref 39–117)
BUN: 14 mg/dL (ref 6–23)
CALCIUM: 9.5 mg/dL (ref 8.4–10.5)
CHLORIDE: 97 meq/L (ref 96–112)
CO2: 27 mEq/L (ref 19–32)
Creat: 0.77 mg/dL (ref 0.50–1.10)
GFR, EST NON AFRICAN AMERICAN: 71 mL/min
GFR, Est African American: 81 mL/min
GLUCOSE: 120 mg/dL — AB (ref 70–99)
POTASSIUM: 4.6 meq/L (ref 3.5–5.3)
Sodium: 134 mEq/L — ABNORMAL LOW (ref 135–145)
TOTAL PROTEIN: 7 g/dL (ref 6.0–8.3)
Total Bilirubin: 0.6 mg/dL (ref 0.2–1.2)

## 2014-01-13 LAB — LIPID PANEL
CHOL/HDL RATIO: 2.3 ratio
CHOLESTEROL: 126 mg/dL (ref 0–200)
HDL: 56 mg/dL (ref >39)
LDL Cholesterol: 48 mg/dL (ref 0–99)
Triglycerides: 112 mg/dL (ref <150)
VLDL: 22 mg/dL (ref 0–40)

## 2014-01-13 LAB — CBC WITH DIFFERENTIAL/PLATELET
Basophils Absolute: 0 10*3/uL (ref 0.0–0.1)
Basophils Relative: 1 % (ref 0–1)
Eosinophils Absolute: 0 10*3/uL (ref 0.0–0.7)
Eosinophils Relative: 1 % (ref 0–5)
HEMATOCRIT: 34.4 % — AB (ref 36.0–46.0)
Hemoglobin: 11.5 g/dL — ABNORMAL LOW (ref 12.0–15.0)
LYMPHS ABS: 1.1 10*3/uL (ref 0.7–4.0)
LYMPHS PCT: 27 % (ref 12–46)
MCH: 26.8 pg (ref 26.0–34.0)
MCHC: 33.4 g/dL (ref 30.0–36.0)
MCV: 80.2 fL (ref 78.0–100.0)
MONO ABS: 0.3 10*3/uL (ref 0.1–1.0)
Monocytes Relative: 7 % (ref 3–12)
NEUTROS ABS: 2.5 10*3/uL (ref 1.7–7.7)
Neutrophils Relative %: 64 % (ref 43–77)
Platelets: 124 10*3/uL — ABNORMAL LOW (ref 150–400)
RBC: 4.29 MIL/uL (ref 3.87–5.11)
RDW: 14.5 % (ref 11.5–15.5)
WBC: 3.9 10*3/uL — ABNORMAL LOW (ref 4.0–10.5)

## 2014-01-13 LAB — HEMOGLOBIN A1C
HEMOGLOBIN A1C: 6.4 % — AB (ref <5.7)
MEAN PLASMA GLUCOSE: 137 mg/dL — AB (ref <117)

## 2014-01-13 LAB — MICROALBUMIN, URINE: MICROALB UR: 1 mg/dL (ref <2.0)

## 2014-01-13 NOTE — Progress Notes (Signed)
Subjective:    Patient ID: Misty Edwards, female    DOB: [birth year], 80 y.o.   MRN: [id number]  HPI Patient is here today for a complete physical exam. Her colonoscopy was in 2013 and is up-to-date. Her pneumonia vaccine is up-to-date. Her shingles vaccine is up-to-date. She is due for a flu shot. She is also due for fasting lab work. Otherwise she has no medical concerns. Past Medical History  Diagnosis Date  . Hypertension   . History of carcinoma in situ of breast 1988  . Acute biliary pancreatitis 07/2002    thia was in 07/2004:she still has pseudocyst in tail of pancreas measuring 54 x 33 mm   . Upper GI bleed September2004    secondary to gastritis  . Diabetes mellitus   . Adrenal adenoma     bilateral  . Detached retina   . Chronic pancreatitis   . Hyperlipidemia   . Osteopenia   . IBS (irritable bowel syndrome)   . Legally blind   . Anxiety   . Osteoarthritis   . Diverticulosis    Past Surgical History  Procedure Laterality Date  . Colonoscopy  08/15/05    few tiny diverticula at sigmoid colon/external hemorrhoids but no polyps  . Right mastectomy    . Ectopic pregnancy surgery  1950's  . Tacking up of her bladder    . Laparoscopic cholecystectomy    . Ercp with sphincterotomy  07/2002  . Pancreatic pseudocyst drainage  June2004    drained percutaneously   . Esophagogastroduodenoscopy       Gastritis of body.  Otherwise normal  . Colonoscopy  01/08/2012    [id number] diverticulosis. Next colonoscopy in 12/2016  . Esophagogastroduodenoscopy  01/08/2012    RMR: Few scattered gastric erosions of uncertain significance-status post biopsy. Minimal chronic inflammation, no H.pylori   Current Outpatient Prescriptions on File Prior to Visit  Medication Sig Dispense Refill  . acetaminophen (TYLENOL) 500 MG tablet Take 500-1,000 mg by mouth 2 (two) times daily as needed. Pain.      Marland Kitchen atenolol (TENORMIN) 25 MG tablet Take 1 tablet (25 mg total) by mouth daily.  90 tablet   4  . Blood Glucose Monitoring Suppl (BLOOD GLUCOSE METER) kit Use as instructed  1 each  0  . calcium carbonate (TUMS EX) 750 MG chewable tablet Chew 1 tablet by mouth daily.      . Carbamide Peroxide (EAR DROPS OT) Place 2 drops in ear(s) daily as needed. Ear pain.      . cetirizine (ZYRTEC) 10 MG tablet Take 1 tablet (10 mg total) by mouth daily.  90 tablet  4  . CREON 24000 UNITS CPEP 2 w/ meals, 1 w/ snacks  720 capsule  3  . cycloSPORINE (RESTASIS) 0.05 % ophthalmic emulsion Place 1 drop into both eyes 3 (three) times daily.       . enalapril (VASOTEC) 20 MG tablet Take 0.5 tablets (10 mg total) by mouth 2 (two) times daily.  180 tablet  4  . esomeprazole (NEXIUM) 40 MG capsule Take 1 capsule (40 mg total) by mouth daily before breakfast.  30 capsule  11  . esomeprazole (NEXIUM) 40 MG packet Take 40 mg by mouth daily before breakfast.  90 each  3  . glucose blood test strip Check BS 5 times per day - Fasting - after each meal and QHS  500 each  4  . hydrochlorothiazide (HYDRODIURIL) 25 MG tablet Take 1 tablet (25 mg total) by mouth  daily.  90 tablet  4  . hyoscyamine (LEVSIN SL) 0.125 MG SL tablet Place 1 tablet (0.125 mg total) under the tongue every 6 (six) hours as needed for cramping or diarrhea or loose stools.  360 tablet  1  . hyoscyamine (LEVSIN SL) 0.125 MG SL tablet Place 1 tablet (0.125 mg total) under the tongue 4 (four) times daily -  before meals and at bedtime. prn  180 tablet  0  . INS SYRINGE/NEEDLE 1CC/28G (MONOJECT ULTRA COMFORT SYRINGE) 28G X 1/2" 1 ML MISC Inject insulin BID  200 each  4  . insulin NPH Human (HUMULIN N) 100 UNIT/ML injection 42 units qam and 4 units qpm  40 mL  4  . Insulin Syringes, Disposable, U-100 1 ML MISC BD Syringes with Ultra Fine Needle (1/82m - 8MM - 31G)  500 each  6  . LORazepam (ATIVAN) 0.5 MG tablet TAKE ONE TABLET BY MOUTH EVERY 8 HOURS AS NEEDED FOR ANXIETY OR  AGITATION  30 tablet  0  . lovastatin (MEVACOR) 20 MG tablet Take 1 tablet (20  mg total) by mouth at bedtime.  90 tablet  4  . metFORMIN (GLUCOPHAGE-XR) 500 MG 24 hr tablet Take 1 tablet (500 mg total) by mouth daily with breakfast.  90 tablet  4  . Multiple Vitamin (MULTIVITAMIN) capsule Take 1 capsule by mouth daily.      .Marland Kitchenzolpidem (AMBIEN) 10 MG tablet TAKE ONE TABLET BY MOUTH AT BEDTIME AS NEEDED FOR INSOMNIA  30 tablet  0   No current facility-administered medications on file prior to visit.   Allergies  Allergen Reactions  . Calcium-Containing Compounds Nausea Only  . Raloxifene Itching    Face and eyes burning  . Vitamin D Analogs Nausea Only   History   Social History  . Marital Status: Married    Spouse Name: N/A    Number of Children: 573 . Years of Education: N/A   Occupational History  . homemaker    Social History Main Topics  . Smoking status: Never Smoker   . Smokeless tobacco: Not on file  . Alcohol Use: No  . Drug Use: No  . Sexual Activity: Not on file   Other Topics Concern  . Not on file   Social History Narrative  . No narrative on file   Family History  Problem Relation Age of Onset  . Colon cancer Father 662   passed away in his 674's . Colon cancer Brother 567   surgery inhis 50's doing well now  . Heart attack Brother     passed away age [id number] . Pancreatitis Brother   . Ovarian cancer Mother     passed  . Kidney disease Daughter   . Leukemia Son      Depression Screen  (Note: if answer to either of the following is "Yes", a more complete depression screening is indicated)  Over the past two weeks, have you felt down, depressed or hopeless? No Over the past two weeks, have you felt little interest or pleasure in doing things? No Have you lost interest or pleasure in daily life? No Do you often feel hopeless? No Do you cry easily over simple problems? No   Activities of Daily Living  In your present state of health, do you have any difficulty performing the following activities?:  Driving? No  Managing money?  No  Feeding yourself? No  Getting from bed to chair? No  Climbing a flight  of stairs? No  Preparing food and eating?: No  Bathing or showering? No  Getting dressed: No  Getting to the toilet? No  Using the toilet:No  Moving around from place to place: No  In the past year have you fallen or had a near fall?:No  Are you sexually active? No  Do you have more than one partner? No   Hearing Difficulties: No  Do you often ask people to speak up or repeat themselves? No  Do you experience ringing or noises in your ears? No Do you have difficulty understanding soft or whispered voices? No  Do you feel that you have a problem with memory? No Do you often misplace items? No  Do you feel safe at home? Yes  Cognitive Testing  Alert? Yes Normal Appearance?Yes  Oriented to person? Yes Place? Yes  Time? Yes  Recall of three objects? Yes  Can perform simple calculations? Yes  Displays appropriate judgment?Yes  Can read the correct time from a watch face?Yes   Review of Systems  All other systems reviewed and are negative.      Objective:   Physical Exam  Vitals reviewed. Constitutional: She is oriented to person, place, and time. She appears well-developed and well-nourished. No distress.  HENT:  Head: Normocephalic and atraumatic.  Right Ear: External ear normal.  Left Ear: External ear normal.  Nose: Nose normal.  Mouth/Throat: Oropharynx is clear and moist. No oropharyngeal exudate.  Eyes: Conjunctivae and EOM are normal. Pupils are equal, round, and reactive to light. Right eye exhibits no discharge. Left eye exhibits no discharge. No scleral icterus.  Neck: Normal range of motion. Neck supple. No JVD present. No tracheal deviation present. No thyromegaly present.  Cardiovascular: Normal rate, regular rhythm, normal heart sounds and intact distal pulses.  Exam reveals no gallop and no friction rub.   No murmur heard. Pulmonary/Chest: Effort normal and breath sounds normal. No  stridor. No respiratory distress. She has no wheezes. She has no rales. She exhibits no tenderness.  Abdominal: Soft. Bowel sounds are normal. She exhibits no distension and no mass. There is no tenderness. There is no rebound and no guarding.  Musculoskeletal: Normal range of motion. She exhibits no edema and no tenderness.  Lymphadenopathy:    She has no cervical adenopathy.  Neurological: She is alert and oriented to person, place, and time. She has normal reflexes. She displays normal reflexes. No cranial nerve deficit. She exhibits normal muscle tone. Coordination normal.  Skin: Skin is warm. No rash noted. She is not diaphoretic. No erythema. No pallor.  Psychiatric: She has a normal mood and affect. Her behavior is normal. Judgment and thought content normal.          Assessment & Plan:  Diabetes mellitus type II, controlled - Plan: CBC with Differential, COMPLETE METABOLIC PANEL WITH GFR, Lipid panel, Hemoglobin A1c, Microalbumin, urine  Need for prophylactic vaccination and inoculation against influenza - Plan: Flu Vaccine QUAD 36+ mos IM  Patient's blood pressure is extremely elevated today. The patient has not taken her blood pressure medication this morning. Furthermore she has a history of white coat syndrome. She checks her blood pressure multiple times every day and her blood pressures always consistently less than 140/90. I recommend she go home immediately and take her blood pressure medication and call me later in the day with her blood pressure. Otherwise her cancer screening is up-to-date. Her immunizations are up-to-date. She received a flu shot today in clinic. I will check  a CBC, CMP, fasting lipid panel, hemoglobin A1c, and urine microalbumin pertaining to her diabetes. Medicare Attestation  I have personally reviewed:  The patient's medical and social history  Their use of alcohol, tobacco or illicit drugs  Their current medications and supplements  The patient's  functional ability including ADLs,fall risks, home safety risks, cognitive, and hearing and visual impairment  Diet and physical activities  Evidence for depression or mood disorders  The patient's weight, height, BMI, and visual acuity have been recorded in the chart. I have made referrals, counseling, and provided education to the patient based on review of the above and I have provided the patient with a written personalized care plan for preventive services.

## 2014-02-06 ENCOUNTER — Other Ambulatory Visit: Payer: Self-pay | Admitting: Family Medicine

## 2014-02-06 NOTE — Telephone Encounter (Signed)
?   OK to Refill  

## 2014-02-06 NOTE — Telephone Encounter (Signed)
ok 

## 2014-02-07 NOTE — Telephone Encounter (Signed)
ok 

## 2014-02-24 ENCOUNTER — Ambulatory Visit (INDEPENDENT_AMBULATORY_CARE_PROVIDER_SITE_OTHER): Payer: Medicare Other | Admitting: Ophthalmology

## 2014-03-21 ENCOUNTER — Ambulatory Visit (INDEPENDENT_AMBULATORY_CARE_PROVIDER_SITE_OTHER): Payer: Medicare HMO | Admitting: Ophthalmology

## 2014-04-17 ENCOUNTER — Telehealth: Payer: Self-pay | Admitting: Family Medicine

## 2014-04-17 NOTE — Telephone Encounter (Signed)
Patients daughter  calling to clarify the quantity of one touch ultra strips that were prescribed [phone]

## 2014-04-18 NOTE — Telephone Encounter (Signed)
Tried to call - no answer and mailbox is full.  

## 2014-05-08 ENCOUNTER — Other Ambulatory Visit: Payer: Self-pay | Admitting: Family Medicine

## 2014-05-08 NOTE — Telephone Encounter (Signed)
ok 

## 2014-05-08 NOTE — Telephone Encounter (Signed)
Medication called to pharmacy. 

## 2014-05-08 NOTE — Telephone Encounter (Signed)
?   OK to Refill  

## 2014-05-14 ENCOUNTER — Other Ambulatory Visit: Payer: Self-pay | Admitting: Family Medicine

## 2014-05-16 ENCOUNTER — Telehealth: Payer: Self-pay | Admitting: Family Medicine

## 2014-05-16 MED ORDER — ZOLPIDEM TARTRATE 10 MG PO TABS: 10.0000 mg | ORAL_TABLET | Freq: Every evening | ORAL | Status: DC | PRN

## 2014-05-16 NOTE — Telephone Encounter (Signed)
?   OK to Refill  

## 2014-05-16 NOTE — Telephone Encounter (Signed)
PA Approved from 03/17/14 - 03/17/15 - Will make pharm aware via phone call when I get an ok from [facility] to call in a refill for her Ambien.

## 2014-05-16 NOTE — Telephone Encounter (Signed)
[phone] Someone came in for pt about the [place] and states she is out and is needing asap

## 2014-05-16 NOTE — Telephone Encounter (Signed)
PA submitted through [email or web address]  

## 2014-05-16 NOTE — Telephone Encounter (Signed)
Per [facility] ok to refill - med called to pharm and pharm aware of approval. LMOVM about pt's medication via Debra's VM

## 2014-06-08 ENCOUNTER — Other Ambulatory Visit: Payer: Self-pay | Admitting: Family Medicine

## 2014-06-08 ENCOUNTER — Other Ambulatory Visit: Payer: Self-pay | Admitting: *Deleted

## 2014-06-08 DIAGNOSIS — E1165 Type 2 diabetes mellitus with hyperglycemia: Secondary | ICD-10-CM

## 2014-06-08 DIAGNOSIS — IMO0002 Reserved for concepts with insufficient information to code with codable children: Secondary | ICD-10-CM

## 2014-06-08 MED ORDER — GLUCOSE BLOOD VI STRP: ORAL_STRIP | Status: DC

## 2014-06-08 NOTE — Telephone Encounter (Signed)
Received fax requesting refill on test strips.   Prescription sent to pharmacy.

## 2014-06-08 NOTE — Telephone Encounter (Signed)
Test strips refilled

## 2014-06-09 ENCOUNTER — Other Ambulatory Visit: Payer: Self-pay | Admitting: Family Medicine

## 2014-06-09 NOTE — Telephone Encounter (Signed)
ok 

## 2014-06-09 NOTE — Telephone Encounter (Signed)
Medication called to pharmacy. 

## 2014-06-09 NOTE — Telephone Encounter (Signed)
Ok to refill??  Last office visit 01/14/2015.  Last refill Ativan 05/08/2014.  Last refill Ambien 05/16/2014.

## 2014-06-15 ENCOUNTER — Telehealth: Payer: Self-pay | Admitting: *Deleted

## 2014-06-15 NOTE — Telephone Encounter (Signed)
Received request from pharmacy for PA on test strips.  PA submitted.   Dx: E11.65

## 2014-06-19 ENCOUNTER — Other Ambulatory Visit: Payer: Self-pay | Admitting: Family Medicine

## 2014-06-19 NOTE — Telephone Encounter (Signed)
Received PA determination.   PA denied.  

## 2014-06-22 ENCOUNTER — Telehealth: Payer: Self-pay | Admitting: Family Medicine

## 2014-06-22 NOTE — Telephone Encounter (Signed)
Informed pt's daughter that a PA for her strips was done and it was denied - she will need to call her insurance to see if they changed her formulary and she may need a different meter and strips. She will call ins and call me back.

## 2014-06-22 NOTE — Telephone Encounter (Signed)
[phone] PT daughter is needing to speak to you ultrablue strips, it Was only written for 100 strips. She normally gets like 450 strips, she only has enough to last her like 2 weeks and she is wanting to speak to you about it.

## 2014-07-03 ENCOUNTER — Other Ambulatory Visit: Payer: Self-pay | Admitting: Family Medicine

## 2014-07-03 NOTE — Telephone Encounter (Signed)
ok 

## 2014-07-03 NOTE — Telephone Encounter (Signed)
Medication called to pharmacy. 

## 2014-07-03 NOTE — Telephone Encounter (Signed)
Ok to refill??  Last office visit 01/13/2014.  Last refill 06/09/2014.

## 2014-07-05 ENCOUNTER — Other Ambulatory Visit: Payer: Self-pay | Admitting: Family Medicine

## 2014-07-05 DIAGNOSIS — IMO0002 Reserved for concepts with insufficient information to code with codable children: Secondary | ICD-10-CM

## 2014-07-05 DIAGNOSIS — E1165 Type 2 diabetes mellitus with hyperglycemia: Secondary | ICD-10-CM

## 2014-07-05 MED ORDER — GLUCOSE BLOOD VI STRP: ORAL_STRIP | Status: DC

## 2014-07-10 ENCOUNTER — Other Ambulatory Visit: Payer: Self-pay | Admitting: Family Medicine

## 2014-07-10 ENCOUNTER — Encounter: Payer: Self-pay | Admitting: Family Medicine

## 2014-07-10 NOTE — Telephone Encounter (Signed)
Medication refill for one time only.  Patient needs to be seen.  Letter sent for patient to call and schedule 

## 2014-07-10 NOTE — Telephone Encounter (Signed)
ok 

## 2014-07-10 NOTE — Telephone Encounter (Signed)
?   OK to Refill - Ambien 

## 2014-07-24 ENCOUNTER — Other Ambulatory Visit: Payer: Self-pay | Admitting: Family Medicine

## 2014-07-28 ENCOUNTER — Encounter: Payer: Self-pay | Admitting: Family Medicine

## 2014-07-28 ENCOUNTER — Ambulatory Visit (INDEPENDENT_AMBULATORY_CARE_PROVIDER_SITE_OTHER): Payer: Medicare HMO | Admitting: Family Medicine

## 2014-07-28 VITALS — BP 150/82 | HR 76 | Temp 97.7°F | Resp 16 | Ht 63.0 in | Wt 137.0 lb

## 2014-07-28 DIAGNOSIS — I10 Essential (primary) hypertension: Secondary | ICD-10-CM | POA: Diagnosis not present

## 2014-07-28 DIAGNOSIS — E785 Hyperlipidemia, unspecified: Secondary | ICD-10-CM

## 2014-07-28 DIAGNOSIS — E119 Type 2 diabetes mellitus without complications: Secondary | ICD-10-CM | POA: Diagnosis not present

## 2014-07-28 DIAGNOSIS — Z23 Encounter for immunization: Secondary | ICD-10-CM

## 2014-07-28 NOTE — Progress Notes (Signed)
Subjective:    Patient ID: Misty Edwards, female    DOB: Sep 23, 1926, 81 y.o.   MRN: [id number]  HPI  patient is here today for follow-up of her medical problems. Her blood pressure slightly high today but she states that the majority of her blood pressures are less than 150/90. Her blood sugars typically range between 100-130. She denies any hypoglycemia. She denies any hypoglycemia. She denies any chest pain shortness of breath dyspnea on exertion. She denies any polyuria, polydipsia, or blurry vision. She denies any myalgias or right upper quadrant pain. She is overdue for fasting lab work. Past Medical History  Diagnosis Date  . Hypertension   . History of carcinoma in situ of breast 1988  . Acute biliary pancreatitis 07/2002    thia was in 07/2004:she still has pseudocyst in tail of pancreas measuring 54 x 33 mm   . Upper GI bleed September2004    secondary to gastritis  . Diabetes mellitus   . Adrenal adenoma     bilateral  . Detached retina   . Chronic pancreatitis   . Hyperlipidemia   . Osteopenia   . IBS (irritable bowel syndrome)   . Legally blind   . Anxiety   . Osteoarthritis   . Diverticulosis    Past Surgical History  Procedure Laterality Date  . Colonoscopy  08/15/05    few tiny diverticula at sigmoid colon/external hemorrhoids but no polyps  . Right mastectomy    . Ectopic pregnancy surgery  1950's  . Tacking up of her bladder    . Laparoscopic cholecystectomy    . Ercp with sphincterotomy  07/2002  . Pancreatic pseudocyst drainage  June2004    drained percutaneously   . Esophagogastroduodenoscopy       Gastritis of body.  Otherwise normal  . Colonoscopy  01/08/2012    [id number] diverticulosis. Next colonoscopy in 12/2016  . Esophagogastroduodenoscopy  01/08/2012    RMR: Few scattered gastric erosions of uncertain significance-status post biopsy. Minimal chronic inflammation, no H.pylori   Current Outpatient Prescriptions on File Prior to Visit    Medication Sig Dispense Refill  . acetaminophen (TYLENOL) 500 MG tablet Take 500-1,000 mg by mouth 2 (two) times daily as needed. Pain.    Marland Kitchen atenolol (TENORMIN) 25 MG tablet Take 1 tablet (25 mg total) by mouth daily. 90 tablet 4  . B-D INS SYRINGE 0.5CC/31GX5/16 31G X 5/16" 0.5 ML MISC USE AS DIRECTED 500 each 5  . Blood Glucose Monitoring Suppl (BLOOD GLUCOSE METER) kit Use as instructed 1 each 0  . calcium carbonate (TUMS EX) 750 MG chewable tablet Chew 1 tablet by mouth daily.    . Carbamide Peroxide (EAR DROPS OT) Place 2 drops in ear(s) daily as needed. Ear pain.    . cetirizine (ZYRTEC) 10 MG tablet TAKE ONE TABLET BY MOUTH ONCE DAILY 90 tablet 3  . CREON 24000 UNITS CPEP 2 w/ meals, 1 w/ snacks 720 capsule 3  . cycloSPORINE (RESTASIS) 0.05 % ophthalmic emulsion Place 1 drop into both eyes 3 (three) times daily.     . enalapril (VASOTEC) 20 MG tablet Take 0.5 tablets (10 mg total) by mouth 2 (two) times daily. 180 tablet 4  . esomeprazole (NEXIUM) 40 MG capsule Take 1 capsule (40 mg total) by mouth daily before breakfast. 30 capsule 11  . esomeprazole (NEXIUM) 40 MG packet Take 40 mg by mouth daily before breakfast. 90 each 3  . glucose blood (ONE TOUCH ULTRA TEST) test strip Use to  monitor FSBS 5x daily for fluctuating CBG. DX: E11.65 450 each 3  . hydrochlorothiazide (HYDRODIURIL) 25 MG tablet Take 1 tablet (25 mg total) by mouth daily. 90 tablet 4  . hyoscyamine (LEVSIN SL) 0.125 MG SL tablet Place 1 tablet (0.125 mg total) under the tongue every 6 (six) hours as needed for cramping or diarrhea or loose stools. 360 tablet 1  . hyoscyamine (LEVSIN SL) 0.125 MG SL tablet Place 1 tablet (0.125 mg total) under the tongue 4 (four) times daily -  before meals and at bedtime. prn 180 tablet 0  . INS SYRINGE/NEEDLE 1CC/28G (MONOJECT ULTRA COMFORT SYRINGE) 28G X 1/2" 1 ML MISC Inject insulin BID 200 each 4  . LORazepam (ATIVAN) 0.5 MG tablet TAKE ONE TABLET BY MOUTH EVERY 8 HOURS AS NEEDED FOR  ANXIETY AND  AGITATION 30 tablet 0  . lovastatin (MEVACOR) 20 MG tablet TAKE ONE TABLET BY MOUTH ONCE DAILY AT BEDTIME 30 tablet 0  . metFORMIN (GLUCOPHAGE-XR) 500 MG 24 hr tablet TAKE ONE TABLET BY MOUTH ONCE DAILY WITH BREAKFAST 90 tablet 3  . Multiple Vitamin (MULTIVITAMIN) capsule Take 1 capsule by mouth daily.    Marland Kitchen NOVOLIN N RELION 100 UNIT/ML injection INJECT 42 UNITS SUBCUTANEOUSLY IN THE MORNING AND 4 UNITS IN THE EVENING 160 mL 3  . zolpidem (AMBIEN) 10 MG tablet TAKE ONE TABLET BY MOUTH AT BEDTIME AS NEEDED FOR  INSOMNIA 30 tablet 0   No current facility-administered medications on file prior to visit.   Allergies  Allergen Reactions  . Calcium-Containing Compounds Nausea Only  . Raloxifene Itching    Face and eyes burning  . Vitamin D Analogs Nausea Only   History   Social History  . Marital Status: Married    Spouse Name: N/A  . Number of Children: 5  . Years of Education: N/A   Occupational History  . homemaker    Social History Main Topics  . Smoking status: Never Smoker   . Smokeless tobacco: Not on file  . Alcohol Use: No  . Drug Use: No  . Sexual Activity: Not on file   Other Topics Concern  . Not on file   Social History Narrative   Family History  Problem Relation Age of Onset  . Colon cancer Father 63    passed away in his 24's  . Colon cancer Brother 84    surgery inhis 50's doing well now  . Heart attack Brother     passed away age 56  . Pancreatitis Brother   . Ovarian cancer Mother     passed  . Kidney disease Daughter   . Leukemia Son      Review of Systems  All other systems reviewed and are negative.      Objective:   Physical Exam  Constitutional: She is oriented to person, place, and time. She appears well-developed and well-nourished. No distress.  HENT:  Head: Normocephalic and atraumatic.  Right Ear: External ear normal.  Left Ear: External ear normal.  Nose: Nose normal.  Mouth/Throat: Oropharynx is clear and moist.  No oropharyngeal exudate.  Eyes: Conjunctivae and EOM are normal. Pupils are equal, round, and reactive to light. Right eye exhibits no discharge. Left eye exhibits no discharge. No scleral icterus.  Neck: Normal range of motion. Neck supple. No JVD present. No tracheal deviation present. No thyromegaly present.  Cardiovascular: Normal rate, regular rhythm, normal heart sounds and intact distal pulses.  Exam reveals no gallop and no friction rub.  No murmur heard. Pulmonary/Chest: Effort normal and breath sounds normal. No stridor. No respiratory distress. She has no wheezes. She has no rales. She exhibits no tenderness.  Abdominal: Soft. Bowel sounds are normal. She exhibits no distension and no mass. There is no tenderness. There is no rebound and no guarding.  Musculoskeletal: Normal range of motion. She exhibits no edema or tenderness.  Lymphadenopathy:    She has no cervical adenopathy.  Neurological: She is alert and oriented to person, place, and time. She has normal reflexes. No cranial nerve deficit. She exhibits normal muscle tone. Coordination normal.  Skin: Skin is warm. No rash noted. She is not diaphoretic. No erythema. No pallor.  Psychiatric: She has a normal mood and affect. Her behavior is normal. Judgment and thought content normal.  Vitals reviewed.         Assessment & Plan:  Diabetes mellitus type II, controlled  HLD (hyperlipidemia)  Benign essential HTN  Blood pressure is slightly elevated. However given her age and her medical comorbidities, I am comfortable with her blood pressure as long as is less than 150/90. I will check a hemoglobin A1c, and a fasting lipid panel. I have recommended the patient return fasting for this lab work. Goal hemoglobin A1c is less than 7. Goal LDL cholesterol is less than 100.

## 2014-07-28 NOTE — Addendum Note (Signed)
Addended by: Shary Decamp B on: 07/28/2014 01:08 PM   Modules accepted: Orders

## 2014-08-01 ENCOUNTER — Other Ambulatory Visit: Payer: Self-pay | Admitting: Family Medicine

## 2014-08-01 ENCOUNTER — Telehealth: Payer: Self-pay | Admitting: Family Medicine

## 2014-08-01 NOTE — Telephone Encounter (Signed)
Patient daughter calling to report that mother blood pressure continues to eval - states dr pickard want her to call and report [phone]

## 2014-08-01 NOTE — Telephone Encounter (Signed)
Spoke to [place] and states that the pt's bp has been running around 160-172 / 63-70 and has been taking the BP med 1/2 tab just PRN. However her husband has not slept well in the last couple of night so the pt has not had but about 4-5 hours of sleep and thinks this may be contributing to her elevated bp. Please advise.

## 2014-08-01 NOTE — Telephone Encounter (Signed)
Take enalapril 20 mg poqday everyday.

## 2014-08-02 ENCOUNTER — Other Ambulatory Visit: Payer: Self-pay | Admitting: Family Medicine

## 2014-08-02 NOTE — Telephone Encounter (Signed)
[name] aware.

## 2014-08-07 ENCOUNTER — Other Ambulatory Visit: Payer: Self-pay | Admitting: Family Medicine

## 2014-08-07 NOTE — Telephone Encounter (Signed)
ok 

## 2014-08-07 NOTE — Telephone Encounter (Signed)
Medication called to pharmacy. 

## 2014-08-07 NOTE — Telephone Encounter (Signed)
Ok to refill??  Last office visit 07/28/2014.  Last refill Ativan 07/13/2014.  Last refill Ambien 07/10/2014.

## 2014-08-22 ENCOUNTER — Telehealth: Payer: Self-pay | Admitting: Family Medicine

## 2014-08-22 MED ORDER — PROMETHAZINE HCL 12.5 MG PO TABS
12.5000 mg | ORAL_TABLET | Freq: Three times a day (TID) | ORAL | Status: DC | PRN
Start: 2014-08-22 — End: 2014-08-31

## 2014-08-22 MED ORDER — CREON 24000-76000 UNITS PO CPEP: ORAL_CAPSULE | ORAL | Status: DC

## 2014-08-22 NOTE — Telephone Encounter (Signed)
Patient's daughter calling to see if some nausea medication can be called in if possible  [phone]

## 2014-08-22 NOTE — Telephone Encounter (Signed)
Phenergan 12.5 mg po q 8 hrs prn (10)

## 2014-08-22 NOTE — Telephone Encounter (Signed)
Medication called/sent to requested pharmacy and pt's daughter aware

## 2014-08-23 ENCOUNTER — Encounter: Payer: Self-pay | Admitting: Family Medicine

## 2014-08-23 NOTE — Telephone Encounter (Signed)
Opened in error

## 2014-08-23 NOTE — Telephone Encounter (Signed)
This encounter was created in error - please disregard.

## 2014-08-24 ENCOUNTER — Encounter (HOSPITAL_COMMUNITY)
Admission: EM | Disposition: A | Discharge status: Skilled Nursing Facility | Payer: Medicare HMO | Source: Home / Self Care | Attending: Cardiothoracic Surgery

## 2014-08-24 ENCOUNTER — Emergency Department (HOSPITAL_COMMUNITY): Payer: Medicare HMO | Admitting: Anesthesiology

## 2014-08-24 ENCOUNTER — Inpatient Hospital Stay (HOSPITAL_COMMUNITY)
Admission: EM | Admit: 2014-08-24 | Discharge: 2014-08-31 | DRG: 233 | Disposition: A | Discharge status: Skilled Nursing Facility | Payer: Medicare HMO | Attending: Cardiothoracic Surgery | Admitting: Cardiothoracic Surgery

## 2014-08-24 ENCOUNTER — Ambulatory Visit (INDEPENDENT_AMBULATORY_CARE_PROVIDER_SITE_OTHER): Payer: Medicare HMO | Admitting: Family Medicine

## 2014-08-24 ENCOUNTER — Encounter: Payer: Self-pay | Admitting: Family Medicine

## 2014-08-24 ENCOUNTER — Encounter (HOSPITAL_COMMUNITY): Payer: Self-pay | Admitting: Emergency Medicine

## 2014-08-24 ENCOUNTER — Emergency Department (HOSPITAL_COMMUNITY): Admit: 2014-08-24 | Discharge: 2014-08-24 | Disposition: A | Discharge status: Home / Self Care | Payer: Medicare HMO

## 2014-08-24 ENCOUNTER — Emergency Department (HOSPITAL_COMMUNITY): Payer: Medicare HMO

## 2014-08-24 VITALS — BP 148/64 | HR 82 | Temp 97.4°F | Resp 16 | Ht 63.0 in | Wt 140.0 lb

## 2014-08-24 DIAGNOSIS — Z8249 Family history of ischemic heart disease and other diseases of the circulatory system: Secondary | ICD-10-CM

## 2014-08-24 DIAGNOSIS — Z806 Family history of leukemia: Secondary | ICD-10-CM | POA: Diagnosis not present

## 2014-08-24 DIAGNOSIS — R0789 Other chest pain: Secondary | ICD-10-CM

## 2014-08-24 DIAGNOSIS — I340 Nonrheumatic mitral (valve) insufficiency: Secondary | ICD-10-CM | POA: Diagnosis present

## 2014-08-24 DIAGNOSIS — D62 Acute posthemorrhagic anemia: Secondary | ICD-10-CM | POA: Diagnosis not present

## 2014-08-24 DIAGNOSIS — I10 Essential (primary) hypertension: Secondary | ICD-10-CM | POA: Diagnosis present

## 2014-08-24 DIAGNOSIS — K861 Other chronic pancreatitis: Secondary | ICD-10-CM | POA: Diagnosis not present

## 2014-08-24 DIAGNOSIS — R51 Headache: Secondary | ICD-10-CM

## 2014-08-24 DIAGNOSIS — K219 Gastro-esophageal reflux disease without esophagitis: Secondary | ICD-10-CM | POA: Diagnosis present

## 2014-08-24 DIAGNOSIS — I5021 Acute systolic (congestive) heart failure: Secondary | ICD-10-CM | POA: Diagnosis present

## 2014-08-24 DIAGNOSIS — E119 Type 2 diabetes mellitus without complications: Secondary | ICD-10-CM | POA: Diagnosis not present

## 2014-08-24 DIAGNOSIS — Z86000 Personal history of in-situ neoplasm of breast: Secondary | ICD-10-CM | POA: Diagnosis not present

## 2014-08-24 DIAGNOSIS — R079 Chest pain, unspecified: Secondary | ICD-10-CM | POA: Diagnosis not present

## 2014-08-24 DIAGNOSIS — E11649 Type 2 diabetes mellitus with hypoglycemia without coma: Secondary | ICD-10-CM | POA: Diagnosis not present

## 2014-08-24 DIAGNOSIS — Z79899 Other long term (current) drug therapy: Secondary | ICD-10-CM | POA: Diagnosis not present

## 2014-08-24 DIAGNOSIS — I214 Non-ST elevation (NSTEMI) myocardial infarction: Principal | ICD-10-CM | POA: Diagnosis present

## 2014-08-24 DIAGNOSIS — I2510 Atherosclerotic heart disease of native coronary artery without angina pectoris: Secondary | ICD-10-CM | POA: Diagnosis present

## 2014-08-24 DIAGNOSIS — Z7982 Long term (current) use of aspirin: Secondary | ICD-10-CM | POA: Diagnosis not present

## 2014-08-24 DIAGNOSIS — K589 Irritable bowel syndrome without diarrhea: Secondary | ICD-10-CM | POA: Diagnosis present

## 2014-08-24 DIAGNOSIS — I249 Acute ischemic heart disease, unspecified: Secondary | ICD-10-CM | POA: Diagnosis not present

## 2014-08-24 DIAGNOSIS — E876 Hypokalemia: Secondary | ICD-10-CM | POA: Diagnosis not present

## 2014-08-24 DIAGNOSIS — D6959 Other secondary thrombocytopenia: Secondary | ICD-10-CM | POA: Diagnosis not present

## 2014-08-24 DIAGNOSIS — Z9011 Acquired absence of right breast and nipple: Secondary | ICD-10-CM | POA: Diagnosis present

## 2014-08-24 DIAGNOSIS — Z794 Long term (current) use of insulin: Secondary | ICD-10-CM

## 2014-08-24 DIAGNOSIS — E1142 Type 2 diabetes mellitus with diabetic polyneuropathy: Secondary | ICD-10-CM | POA: Diagnosis present

## 2014-08-24 DIAGNOSIS — I25110 Atherosclerotic heart disease of native coronary artery with unstable angina pectoris: Secondary | ICD-10-CM | POA: Diagnosis not present

## 2014-08-24 DIAGNOSIS — K863 Pseudocyst of pancreas: Secondary | ICD-10-CM | POA: Diagnosis present

## 2014-08-24 DIAGNOSIS — H548 Legal blindness, as defined in USA: Secondary | ICD-10-CM | POA: Diagnosis not present

## 2014-08-24 DIAGNOSIS — E785 Hyperlipidemia, unspecified: Secondary | ICD-10-CM | POA: Diagnosis not present

## 2014-08-24 DIAGNOSIS — R32 Unspecified urinary incontinence: Secondary | ICD-10-CM | POA: Diagnosis not present

## 2014-08-24 DIAGNOSIS — F419 Anxiety disorder, unspecified: Secondary | ICD-10-CM | POA: Diagnosis not present

## 2014-08-24 DIAGNOSIS — R519 Headache, unspecified: Secondary | ICD-10-CM

## 2014-08-24 DIAGNOSIS — J939 Pneumothorax, unspecified: Secondary | ICD-10-CM

## 2014-08-24 DIAGNOSIS — Z951 Presence of aortocoronary bypass graft: Secondary | ICD-10-CM

## 2014-08-24 HISTORY — PX: CARDIAC CATHETERIZATION: SHX172

## 2014-08-24 HISTORY — PX: TEE WITHOUT CARDIOVERSION: SHX5443

## 2014-08-24 HISTORY — PX: CORONARY ARTERY BYPASS GRAFT: SHX141

## 2014-08-24 LAB — CBC WITH DIFFERENTIAL/PLATELET
Basophils Absolute: 0 10*3/uL (ref 0.0–0.1)
Basophils Relative: 0 % (ref 0–1)
EOS PCT: 0 % (ref 0–5)
Eosinophils Absolute: 0 10*3/uL (ref 0.0–0.7)
HCT: 33.9 % — ABNORMAL LOW (ref 36.0–46.0)
Hemoglobin: 11.1 g/dL — ABNORMAL LOW (ref 12.0–15.0)
LYMPHS PCT: 18 % (ref 12–46)
Lymphs Abs: 1.3 10*3/uL (ref 0.7–4.0)
MCH: 26.9 pg (ref 26.0–34.0)
MCHC: 32.7 g/dL (ref 30.0–36.0)
MCV: 82.1 fL (ref 78.0–100.0)
Monocytes Absolute: 0.5 10*3/uL (ref 0.1–1.0)
Monocytes Relative: 7 % (ref 3–12)
Neutro Abs: 5.4 10*3/uL (ref 1.7–7.7)
Neutrophils Relative %: 75 % (ref 43–77)
Platelets: 141 10*3/uL — ABNORMAL LOW (ref 150–400)
RBC: 4.13 MIL/uL (ref 3.87–5.11)
RDW: 14.1 % (ref 11.5–15.5)
WBC: 7.1 10*3/uL (ref 4.0–10.5)

## 2014-08-24 LAB — POCT I-STAT, CHEM 8
BUN: 19 mg/dL (ref 6–20)
BUN: 16 mg/dL (ref 6–20)
BUN: 16 mg/dL (ref 6–20)
BUN: 14 mg/dL (ref 6–20)
BUN: 14 mg/dL (ref 6–20)
CALCIUM ION: 1.19 mmol/L (ref 1.13–1.30)
Calcium, Ion: 1.17 mmol/L (ref 1.13–1.30)
Calcium, Ion: 0.97 mmol/L — ABNORMAL LOW (ref 1.13–1.30)
Calcium, Ion: 0.82 mmol/L — ABNORMAL LOW (ref 1.13–1.30)
Calcium, Ion: 0.81 mmol/L — ABNORMAL LOW (ref 1.13–1.30)
Chloride: 94 mmol/L — ABNORMAL LOW (ref 101–111)
Chloride: 89 mmol/L — ABNORMAL LOW (ref 101–111)
Chloride: 93 mmol/L — ABNORMAL LOW (ref 101–111)
Chloride: 92 mmol/L — ABNORMAL LOW (ref 101–111)
Chloride: 94 mmol/L — ABNORMAL LOW (ref 101–111)
Creatinine, Ser: 0.7 mg/dL (ref 0.44–1.00)
Creatinine, Ser: 0.6 mg/dL (ref 0.44–1.00)
Creatinine, Ser: 0.7 mg/dL (ref 0.44–1.00)
Creatinine, Ser: 0.5 mg/dL (ref 0.44–1.00)
Creatinine, Ser: 0.7 mg/dL (ref 0.44–1.00)
GLUCOSE: 175 mg/dL — AB (ref 65–99)
Glucose, Bld: 192 mg/dL — ABNORMAL HIGH (ref 65–99)
Glucose, Bld: 186 mg/dL — ABNORMAL HIGH (ref 65–99)
Glucose, Bld: 191 mg/dL — ABNORMAL HIGH (ref 65–99)
Glucose, Bld: 218 mg/dL — ABNORMAL HIGH (ref 65–99)
HCT: 25 % — ABNORMAL LOW (ref 36.0–46.0)
HCT: 25 % — ABNORMAL LOW (ref 36.0–46.0)
HEMATOCRIT: 32 % — AB (ref 36.0–46.0)
HEMATOCRIT: 27 % — AB (ref 36.0–46.0)
HEMATOCRIT: 23 % — AB (ref 36.0–46.0)
HEMOGLOBIN: 10.9 g/dL — AB (ref 12.0–15.0)
HEMOGLOBIN: 8.5 g/dL — AB (ref 12.0–15.0)
Hemoglobin: 9.2 g/dL — ABNORMAL LOW (ref 12.0–15.0)
Hemoglobin: 7.8 g/dL — ABNORMAL LOW (ref 12.0–15.0)
Hemoglobin: 8.5 g/dL — ABNORMAL LOW (ref 12.0–15.0)
POTASSIUM: 3.8 mmol/L (ref 3.5–5.1)
Potassium: 3.9 mmol/L (ref 3.5–5.1)
Potassium: 3.8 mmol/L (ref 3.5–5.1)
Potassium: 4.3 mmol/L (ref 3.5–5.1)
Potassium: 5.5 mmol/L — ABNORMAL HIGH (ref 3.5–5.1)
SODIUM: 126 mmol/L — AB (ref 135–145)
SODIUM: 128 mmol/L — AB (ref 135–145)
SODIUM: 127 mmol/L — AB (ref 135–145)
Sodium: 128 mmol/L — ABNORMAL LOW (ref 135–145)
Sodium: 132 mmol/L — ABNORMAL LOW (ref 135–145)
TCO2: 21 mmol/L (ref 0–100)
TCO2: 20 mmol/L (ref 0–100)
TCO2: 22 mmol/L (ref 0–100)
TCO2: 20 mmol/L (ref 0–100)
TCO2: 19 mmol/L (ref 0–100)

## 2014-08-24 LAB — POCT I-STAT 3, ART BLOOD GAS (G3+)
Acid-base deficit: 2 mmol/L (ref 0.0–2.0)
Acid-base deficit: 5 mmol/L — ABNORMAL HIGH (ref 0.0–2.0)
BICARBONATE: 22.9 meq/L (ref 20.0–24.0)
Bicarbonate: 20.4 mEq/L (ref 20.0–24.0)
O2 SAT: 99 %
O2 Saturation: 100 %
PH ART: 7.334 — AB (ref 7.350–7.450)
TCO2: 24 mmol/L (ref 0–100)
TCO2: 22 mmol/L (ref 0–100)
pCO2 arterial: 39.6 mmHg (ref 35.0–45.0)
pCO2 arterial: 38.3 mmHg (ref 35.0–45.0)
pH, Arterial: 7.37 (ref 7.350–7.450)
pO2, Arterial: 385 mmHg — ABNORMAL HIGH (ref 80.0–100.0)
pO2, Arterial: 163 mmHg — ABNORMAL HIGH (ref 80.0–100.0)

## 2014-08-24 LAB — PROTIME-INR
INR: 1.05 (ref 0.00–1.49)
PROTHROMBIN TIME: 13.9 s (ref 11.6–15.2)

## 2014-08-24 LAB — PLATELET COUNT: Platelets: 101 10*3/uL — ABNORMAL LOW (ref 150–400)

## 2014-08-24 LAB — HEMOGLOBIN AND HEMATOCRIT, BLOOD
HCT: 25.8 % — ABNORMAL LOW (ref 36.0–46.0)
Hemoglobin: 8.9 g/dL — ABNORMAL LOW (ref 12.0–15.0)

## 2014-08-24 LAB — TROPONIN I: TROPONIN I: 3.44 ng/mL — AB (ref <0.031)

## 2014-08-24 LAB — COMPREHENSIVE METABOLIC PANEL
ALK PHOS: 37 U/L — AB (ref 38–126)
ALT: 37 U/L (ref 14–54)
AST: 62 U/L — ABNORMAL HIGH (ref 15–41)
Albumin: 4.4 g/dL (ref 3.5–5.0)
Anion gap: 11 (ref 5–15)
BILIRUBIN TOTAL: 0.9 mg/dL (ref 0.3–1.2)
BUN: 22 mg/dL — ABNORMAL HIGH (ref 6–20)
CHLORIDE: 90 mmol/L — AB (ref 101–111)
CO2: 25 mmol/L (ref 22–32)
Calcium: 9.3 mg/dL (ref 8.9–10.3)
Creatinine, Ser: 0.97 mg/dL (ref 0.44–1.00)
GFR calc Af Amer: >60 mL/min (ref >60)
GFR calc non Af Amer: 52 mL/min — ABNORMAL LOW (ref >60)
Glucose, Bld: 213 mg/dL — ABNORMAL HIGH (ref 65–99)
POTASSIUM: 4.5 mmol/L (ref 3.5–5.1)
Sodium: 126 mmol/L — ABNORMAL LOW (ref 135–145)
Total Protein: 7.4 g/dL (ref 6.5–8.1)

## 2014-08-24 LAB — ABO/RH: ABO/RH(D): A POS

## 2014-08-24 LAB — APTT: aPTT: 27 seconds (ref 24–37)

## 2014-08-24 LAB — PREPARE RBC (CROSSMATCH)

## 2014-08-24 LAB — POCT ACTIVATED CLOTTING TIME: ACTIVATED CLOTTING TIME: 153 s

## 2014-08-24 SURGERY — LEFT HEART CATH AND CORONARY ANGIOGRAPHY
Anesthesia: LOCAL

## 2014-08-24 SURGERY — CORONARY ARTERY BYPASS GRAFTING (CABG)
Anesthesia: General | Site: Chest

## 2014-08-24 MED ORDER — LIDOCAINE HCL (PF) 1 % IJ SOLN
INTRAMUSCULAR | Status: AC
  Filled 2014-08-24: qty 30

## 2014-08-24 MED ORDER — METOPROLOL TARTRATE 1 MG/ML IV SOLN
INTRAVENOUS | Status: DC | PRN
  Administered 2014-08-24: 5 mg via INTRAVENOUS

## 2014-08-24 MED ORDER — LACTATED RINGERS IV SOLN
INTRAVENOUS | Status: DC | PRN
  Administered 2014-08-24: 19:00:00 via INTRAVENOUS

## 2014-08-24 MED ORDER — VANCOMYCIN HCL 10 G IV SOLR
1250.0000 mg | INTRAVENOUS | Status: DC
  Administered 2014-08-24: 1250 mg via INTRAVENOUS
  Filled 2014-08-24: qty 1250

## 2014-08-24 MED ORDER — DEXMEDETOMIDINE HCL IN NACL 400 MCG/100ML IV SOLN
0.1000 ug/kg/h | INTRAVENOUS | Status: DC
  Administered 2014-08-24: .3 ug/kg/h via INTRAVENOUS
  Filled 2014-08-24: qty 100

## 2014-08-24 MED ORDER — FENTANYL CITRATE (PF) 250 MCG/5ML IJ SOLN
INTRAMUSCULAR | Status: AC
  Filled 2014-08-24: qty 5

## 2014-08-24 MED ORDER — MILRINONE IN DEXTROSE 20 MG/100ML IV SOLN
0.1250 ug/kg/min | INTRAVENOUS | Status: DC
  Filled 2014-08-24: qty 100

## 2014-08-24 MED ORDER — FUROSEMIDE 10 MG/ML IJ SOLN
INTRAMUSCULAR | Status: DC | PRN
  Administered 2014-08-24: 20 mg via INTRAVENOUS

## 2014-08-24 MED ORDER — LORATADINE 10 MG PO TABS: 10.0000 mg | ORAL_TABLET | Freq: Every day | ORAL | Status: DC

## 2014-08-24 MED ORDER — POTASSIUM CHLORIDE 2 MEQ/ML IV SOLN
80.0000 meq | INTRAVENOUS | Status: DC
  Filled 2014-08-24: qty 40

## 2014-08-24 MED ORDER — HEMOSTATIC AGENTS (NO CHARGE) OPTIME
TOPICAL | Status: DC | PRN
  Administered 2014-08-24: 1 via TOPICAL

## 2014-08-24 MED ORDER — ATENOLOL 25 MG PO TABS: 25.0000 mg | ORAL_TABLET | Freq: Every day | ORAL | Status: DC

## 2014-08-24 MED ORDER — VERAPAMIL HCL 2.5 MG/ML IV SOLN
INTRAVENOUS | Status: AC
  Filled 2014-08-24: qty 2

## 2014-08-24 MED ORDER — NITROGLYCERIN IN D5W 200-5 MCG/ML-% IV SOLN
INTRAVENOUS | Status: DC | PRN
  Administered 2014-08-24: 20 ug/min via INTRAVENOUS

## 2014-08-24 MED ORDER — FENTANYL CITRATE (PF) 100 MCG/2ML IJ SOLN
INTRAMUSCULAR | Status: DC | PRN
  Administered 2014-08-24: 250 ug via INTRAVENOUS
  Administered 2014-08-24: 100 ug via INTRAVENOUS
  Administered 2014-08-24: 150 ug via INTRAVENOUS
  Administered 2014-08-24 (×2): 250 ug via INTRAVENOUS

## 2014-08-24 MED ORDER — CALCIUM CARBONATE ANTACID 750 MG PO CHEW: 1.0000 | CHEWABLE_TABLET | Freq: Every day | ORAL | Status: DC

## 2014-08-24 MED ORDER — ASPIRIN 81 MG PO CHEW
324.0000 mg | CHEWABLE_TABLET | Freq: Once | ORAL | Status: AC
  Administered 2014-08-24: 324 mg via ORAL
  Filled 2014-08-24: qty 4

## 2014-08-24 MED ORDER — ENALAPRIL MALEATE 20 MG PO TABS: 20.0000 mg | ORAL_TABLET | Freq: Every day | ORAL | Status: DC

## 2014-08-24 MED ORDER — VECURONIUM BROMIDE 10 MG IV SOLR
INTRAVENOUS | Status: DC | PRN
  Administered 2014-08-24: 10 mg via INTRAVENOUS

## 2014-08-24 MED ORDER — DEXTROSE 5 % IV SOLN
30.0000 ug/min | INTRAVENOUS | Status: DC
  Administered 2014-08-24: 30 ug/min via INTRAVENOUS
  Filled 2014-08-24: qty 2

## 2014-08-24 MED ORDER — HEPARIN SODIUM (PORCINE) 1000 UNIT/ML IJ SOLN
INTRAMUSCULAR | Status: DC | PRN
  Administered 2014-08-24 (×2): 2000 [IU] via INTRAVENOUS
  Administered 2014-08-24: 14000 [IU] via INTRAVENOUS

## 2014-08-24 MED ORDER — LACTATED RINGERS IV SOLN
INTRAVENOUS | Status: DC | PRN
  Administered 2014-08-24 (×2): via INTRAVENOUS

## 2014-08-24 MED ORDER — IOHEXOL 350 MG/ML SOLN
INTRAVENOUS | Status: DC | PRN
  Administered 2014-08-24: 50 mL via INTRAVENOUS

## 2014-08-24 MED ORDER — ROSUVASTATIN CALCIUM 40 MG PO TABS
40.0000 mg | ORAL_TABLET | Freq: Every day | ORAL | Status: DC
  Administered 2014-08-26 – 2014-08-30 (×5): 40 mg via ORAL
  Filled 2014-08-24 (×8): qty 1

## 2014-08-24 MED ORDER — SODIUM CHLORIDE 0.9 % IV SOLN
20.0000 ug | INTRAVENOUS | Status: AC
  Administered 2014-08-24: 20 ug via INTRAVENOUS
  Filled 2014-08-24: qty 5

## 2014-08-24 MED ORDER — HEPARIN (PORCINE) IN NACL 100-0.45 UNIT/ML-% IJ SOLN
650.0000 [IU]/h | INTRAMUSCULAR | Status: DC
  Administered 2014-08-24: 650 [IU]/h via INTRAVENOUS
  Filled 2014-08-24: qty 250

## 2014-08-24 MED ORDER — PROTAMINE SULFATE 10 MG/ML IV SOLN
INTRAVENOUS | Status: DC | PRN
  Administered 2014-08-24: 150 mg via INTRAVENOUS

## 2014-08-24 MED ORDER — DOPAMINE-DEXTROSE 3.2-5 MG/ML-% IV SOLN
0.0000 ug/kg/min | INTRAVENOUS | Status: DC
  Filled 2014-08-24: qty 250

## 2014-08-24 MED ORDER — DEXTROSE 5 % IV SOLN
750.0000 mg | INTRAVENOUS | Status: DC
  Filled 2014-08-24: qty 750

## 2014-08-24 MED ORDER — NITROGLYCERIN IN D5W 200-5 MCG/ML-% IV SOLN
2.0000 ug/min | INTRAVENOUS | Status: DC
  Administered 2014-08-24: 30 ug/min via INTRAVENOUS
  Filled 2014-08-24: qty 250

## 2014-08-24 MED ORDER — 0.9 % SODIUM CHLORIDE (POUR BTL) OPTIME
TOPICAL | Status: DC | PRN
  Administered 2014-08-24: 6000 mL

## 2014-08-24 MED ORDER — ZOLPIDEM TARTRATE 5 MG PO TABS: 5.0000 mg | ORAL_TABLET | Freq: Every evening | ORAL | Status: DC | PRN

## 2014-08-24 MED ORDER — LIDOCAINE HCL (PF) 1 % IJ SOLN
INTRAMUSCULAR | Status: DC | PRN
  Administered 2014-08-24: 5 mL via INTRADERMAL

## 2014-08-24 MED ORDER — ETOMIDATE 2 MG/ML IV SOLN
INTRAVENOUS | Status: DC | PRN
  Administered 2014-08-24: 14 mg via INTRAVENOUS

## 2014-08-24 MED ORDER — LORAZEPAM 0.5 MG PO TABS
0.5000 mg | ORAL_TABLET | Freq: Four times a day (QID) | ORAL | Status: DC | PRN
Start: 2014-08-24 — End: 2014-08-25

## 2014-08-24 MED ORDER — ARTIFICIAL TEARS OP OINT
TOPICAL_OINTMENT | OPHTHALMIC | Status: DC | PRN
  Administered 2014-08-24: 1 via OPHTHALMIC

## 2014-08-24 MED ORDER — DEXTROSE 5 % IV SOLN
1.5000 g | INTRAVENOUS | Status: DC
  Administered 2014-08-24: .75 g via INTRAVENOUS
  Administered 2014-08-24: 1.5 g via INTRAVENOUS
  Filled 2014-08-24: qty 1.5

## 2014-08-24 MED ORDER — SODIUM CHLORIDE 0.9 % IV SOLN
INTRAVENOUS | Status: DC
  Administered 2014-08-24: 69.8 mL/h via INTRAVENOUS
  Filled 2014-08-24: qty 40

## 2014-08-24 MED ORDER — ROCURONIUM BROMIDE 100 MG/10ML IV SOLN
INTRAVENOUS | Status: DC | PRN
  Administered 2014-08-24 (×3): 50 mg via INTRAVENOUS

## 2014-08-24 MED ORDER — VERAPAMIL HCL 2.5 MG/ML IV SOLN
INTRAVENOUS | Status: DC | PRN
  Administered 2014-08-24: 17:00:00 via INTRA_ARTERIAL

## 2014-08-24 MED ORDER — HYOSCYAMINE SULFATE 0.125 MG SL SUBL: 0.1250 mg | SUBLINGUAL_TABLET | Freq: Four times a day (QID) | SUBLINGUAL | Status: DC | PRN

## 2014-08-24 MED ORDER — CYCLOSPORINE 0.05 % OP EMUL
1.0000 [drp] | Freq: Three times a day (TID) | OPHTHALMIC | Status: DC
  Administered 2014-08-25 – 2014-08-31 (×15): 1 [drp] via OPHTHALMIC
  Filled 2014-08-24 (×23): qty 1

## 2014-08-24 MED ORDER — NITROGLYCERIN IN D5W 200-5 MCG/ML-% IV SOLN
5.0000 ug/min | Freq: Once | INTRAVENOUS | Status: AC
  Administered 2014-08-24: 5 ug/min via INTRAVENOUS
  Filled 2014-08-24: qty 250

## 2014-08-24 MED ORDER — INSULIN NPH (HUMAN) (ISOPHANE) 100 UNIT/ML ~~LOC~~ SUSP: 30.0000 [IU] | Freq: Every day | SUBCUTANEOUS | Status: DC

## 2014-08-24 MED ORDER — SODIUM CHLORIDE 0.9 % IJ SOLN
OROMUCOSAL | Status: DC | PRN
  Administered 2014-08-24 (×3): 4 mL via TOPICAL

## 2014-08-24 MED ORDER — MIDAZOLAM HCL 10 MG/2ML IJ SOLN
INTRAMUSCULAR | Status: AC
  Filled 2014-08-24: qty 2

## 2014-08-24 MED ORDER — MAGNESIUM SULFATE 50 % IJ SOLN
40.0000 meq | INTRAMUSCULAR | Status: DC
  Filled 2014-08-24: qty 10

## 2014-08-24 MED ORDER — MORPHINE SULFATE 4 MG/ML IJ SOLN
4.0000 mg | Freq: Once | INTRAMUSCULAR | Status: AC
  Administered 2014-08-24: 4 mg via INTRAVENOUS
  Filled 2014-08-24: qty 1

## 2014-08-24 MED ORDER — METOPROLOL TARTRATE 1 MG/ML IV SOLN
INTRAVENOUS | Status: AC
  Filled 2014-08-24: qty 5

## 2014-08-24 MED ORDER — PLASMA-LYTE 148 IV SOLN
INTRAVENOUS | Status: DC
  Filled 2014-08-24: qty 2.5

## 2014-08-24 MED ORDER — METOPROLOL TARTRATE 1 MG/ML IV SOLN
5.0000 mg | Freq: Once | INTRAVENOUS | Status: AC
  Administered 2014-08-24: 5 mg via INTRAVENOUS

## 2014-08-24 MED ORDER — HEPARIN (PORCINE) IN NACL 2-0.9 UNIT/ML-% IJ SOLN
INTRAMUSCULAR | Status: AC
  Filled 2014-08-24: qty 1000

## 2014-08-24 MED ORDER — EPINEPHRINE HCL 1 MG/ML IJ SOLN
4000.0000 ug | INTRAVENOUS | Status: DC | PRN
  Administered 2014-08-24: 3 ug/min via INTRAVENOUS

## 2014-08-24 MED ORDER — SODIUM BICARBONATE 8.4 % IV SOLN
INTRAVENOUS | Status: DC | PRN
  Administered 2014-08-24: 50 meq via INTRAVENOUS

## 2014-08-24 MED ORDER — LACTATED RINGERS IV SOLN: INTRAVENOUS | Status: DC | PRN

## 2014-08-24 MED ORDER — LIDOCAINE HCL (CARDIAC) 20 MG/ML IV SOLN
INTRAVENOUS | Status: DC | PRN
  Administered 2014-08-24: 100 mg via INTRAVENOUS

## 2014-08-24 MED ORDER — MILRINONE IN DEXTROSE 20 MG/100ML IV SOLN
INTRAVENOUS | Status: DC | PRN
  Administered 2014-08-24: 0.5 ug/kg/min via INTRAVENOUS

## 2014-08-24 MED ORDER — PANTOPRAZOLE SODIUM 40 MG PO TBEC: 80.0000 mg | DELAYED_RELEASE_TABLET | Freq: Every day | ORAL | Status: DC

## 2014-08-24 MED ORDER — FUROSEMIDE 10 MG/ML IJ SOLN
INTRAMUSCULAR | Status: AC
  Filled 2014-08-24: qty 4

## 2014-08-24 MED ORDER — SODIUM CHLORIDE 0.9 % IV SOLN
INTRAVENOUS | Status: DC
  Administered 2014-08-24: 2.5 [IU]/h via INTRAVENOUS
  Filled 2014-08-24: qty 2.5

## 2014-08-24 MED ORDER — SODIUM CHLORIDE 0.9 % IV SOLN
Freq: Once | INTRAVENOUS | Status: AC
  Administered 2014-08-24: 19:00:00 via INTRAVENOUS

## 2014-08-24 MED ORDER — SODIUM CHLORIDE 0.9 % IV SOLN
INTRAVENOUS | Status: DC
  Filled 2014-08-24: qty 30

## 2014-08-24 MED ORDER — HEPARIN BOLUS VIA INFUSION
4000.0000 [IU] | Freq: Once | INTRAVENOUS | Status: AC
  Administered 2014-08-24: 4000 [IU] via INTRAVENOUS

## 2014-08-24 MED ORDER — PLASMA-LYTE 148 IV SOLN
INTRAVENOUS | Status: DC | PRN
  Administered 2014-08-24: 500 mL via INTRAVASCULAR

## 2014-08-24 MED ORDER — EPINEPHRINE HCL 1 MG/ML IJ SOLN
0.0000 ug/min | INTRAVENOUS | Status: DC
  Filled 2014-08-24: qty 4

## 2014-08-24 MED ORDER — PROPOFOL 10 MG/ML IV BOLUS
INTRAVENOUS | Status: AC
  Filled 2014-08-24: qty 20

## 2014-08-24 MED ORDER — MULTIVITAMINS PO CAPS: 1.0000 | ORAL_CAPSULE | Freq: Every day | ORAL | Status: DC

## 2014-08-24 MED ORDER — PANCRELIPASE (LIP-PROT-AMYL) 12000-38000 UNITS PO CPEP: 48000.0000 [IU] | ORAL_CAPSULE | Freq: Three times a day (TID) | ORAL | Status: DC

## 2014-08-24 MED ORDER — MIDAZOLAM HCL 5 MG/5ML IJ SOLN
INTRAMUSCULAR | Status: DC | PRN
  Administered 2014-08-24 (×5): 2 mg via INTRAVENOUS

## 2014-08-24 SURGICAL SUPPLY — 94 items
ADAPTER CARDIO PERF ANTE/RETRO (ADAPTER) ×3 IMPLANT
BAG DECANTER FOR FLEXI CONT (MISCELLANEOUS) ×3 IMPLANT
BANDAGE ELASTIC 4 VELCRO ST LF (GAUZE/BANDAGES/DRESSINGS) ×3 IMPLANT
BANDAGE ELASTIC 6 VELCRO ST LF (GAUZE/BANDAGES/DRESSINGS) ×3 IMPLANT
BASKET HEART  (ORDER IN 25'S) (MISCELLANEOUS) ×1
BASKET HEART (ORDER IN 25'S) (MISCELLANEOUS) ×1
BASKET HEART (ORDER IN 25S) (MISCELLANEOUS) ×1 IMPLANT
BLADE STERNUM SYSTEM 6 (BLADE) ×3 IMPLANT
BLADE SURG 12 STRL SS (BLADE) ×3 IMPLANT
BLADE SURG ROTATE 9660 (MISCELLANEOUS) IMPLANT
BNDG GAUZE ELAST 4 BULKY (GAUZE/BANDAGES/DRESSINGS) ×3 IMPLANT
CANISTER SUCTION 2500CC (MISCELLANEOUS) ×3 IMPLANT
CANNULA GUNDRY RCSP 15FR (MISCELLANEOUS) ×3 IMPLANT
CATH CPB KIT VANTRIGT (MISCELLANEOUS) ×3 IMPLANT
CATH ROBINSON RED A/P 18FR (CATHETERS) ×9 IMPLANT
CATH THORACIC 36FR RT ANG (CATHETERS) ×3 IMPLANT
CLIP TI WIDE RED SMALL 24 (CLIP) ×3 IMPLANT
COVER SURGICAL LIGHT HANDLE (MISCELLANEOUS) ×3 IMPLANT
CRADLE DONUT ADULT HEAD (MISCELLANEOUS) ×3 IMPLANT
DRAIN CHANNEL 32F RND 10.7 FF (WOUND CARE) ×3 IMPLANT
DRAPE CARDIOVASCULAR INCISE (DRAPES) ×2
DRAPE SLUSH/WARMER DISC (DRAPES) ×3 IMPLANT
DRAPE SRG 135X102X78XABS (DRAPES) ×1 IMPLANT
DRSG AQUACEL AG ADV 3.5X14 (GAUZE/BANDAGES/DRESSINGS) ×3 IMPLANT
ELECT BLADE 4.0 EZ CLEAN MEGAD (MISCELLANEOUS) ×3
ELECT BLADE 6.5 EXT (BLADE) ×3 IMPLANT
ELECT CAUTERY BLADE 6.4 (BLADE) ×3 IMPLANT
ELECT REM PT RETURN 9FT ADLT (ELECTROSURGICAL) ×6
ELECTRODE BLDE 4.0 EZ CLN MEGD (MISCELLANEOUS) ×1 IMPLANT
ELECTRODE REM PT RTRN 9FT ADLT (ELECTROSURGICAL) ×2 IMPLANT
GAUZE SPONGE 4X4 12PLY STRL (GAUZE/BANDAGES/DRESSINGS) ×6 IMPLANT
GLOVE BIO SURGEON STRL SZ 6 (GLOVE) ×12 IMPLANT
GLOVE BIO SURGEON STRL SZ 6.5 (GLOVE) ×2 IMPLANT
GLOVE BIO SURGEON STRL SZ7.5 (GLOVE) ×9 IMPLANT
GLOVE BIO SURGEONS STRL SZ 6.5 (GLOVE) ×1
GLOVE BIOGEL PI IND STRL 6 (GLOVE) ×4 IMPLANT
GLOVE BIOGEL PI INDICATOR 6 (GLOVE) ×8
GOWN STRL REUS W/ TWL LRG LVL3 (GOWN DISPOSABLE) ×4 IMPLANT
GOWN STRL REUS W/TWL LRG LVL3 (GOWN DISPOSABLE) ×8
HEMOSTAT POWDER SURGIFOAM 1G (HEMOSTASIS) ×9 IMPLANT
HEMOSTAT SURGICEL 2X14 (HEMOSTASIS) ×3 IMPLANT
INSERT FOGARTY XLG (MISCELLANEOUS) IMPLANT
KIT BASIN OR (CUSTOM PROCEDURE TRAY) ×3 IMPLANT
KIT ROOM TURNOVER OR (KITS) ×3 IMPLANT
KIT SUCTION CATH 14FR (SUCTIONS) ×3 IMPLANT
KIT VASOVIEW W/TROCAR VH 2000 (KITS) ×3 IMPLANT
LEAD PACING MYOCARDI (MISCELLANEOUS) ×3 IMPLANT
MARKER GRAFT CORONARY BYPASS (MISCELLANEOUS) ×9 IMPLANT
NS IRRIG 1000ML POUR BTL (IV SOLUTION) ×15 IMPLANT
PACK OPEN HEART (CUSTOM PROCEDURE TRAY) ×3 IMPLANT
PAD ARMBOARD 7.5X6 YLW CONV (MISCELLANEOUS) ×6 IMPLANT
PAD ELECT DEFIB RADIOL ZOLL (MISCELLANEOUS) ×3 IMPLANT
PENCIL BUTTON HOLSTER BLD 10FT (ELECTRODE) ×3 IMPLANT
PUNCH AORTIC ROTATE 4.0MM (MISCELLANEOUS) IMPLANT
PUNCH AORTIC ROTATE 4.5MM 8IN (MISCELLANEOUS) IMPLANT
PUNCH AORTIC ROTATE 5MM 8IN (MISCELLANEOUS) IMPLANT
SPOGE SURGIFLO 8M (HEMOSTASIS) ×2
SPONGE GAUZE 4X4 12PLY STER LF (GAUZE/BANDAGES/DRESSINGS) ×6 IMPLANT
SPONGE SURGIFLO 8M (HEMOSTASIS) ×1 IMPLANT
SURGIFLO W/THROMBIN 8M KIT (HEMOSTASIS) ×3 IMPLANT
SUT BONE WAX W31G (SUTURE) ×3 IMPLANT
SUT MNCRL AB 4-0 PS2 18 (SUTURE) ×3 IMPLANT
SUT PROLENE 3 0 SH DA (SUTURE) IMPLANT
SUT PROLENE 3 0 SH1 36 (SUTURE) IMPLANT
SUT PROLENE 4 0 RB 1 (SUTURE) ×2
SUT PROLENE 4 0 SH DA (SUTURE) ×3 IMPLANT
SUT PROLENE 4-0 RB1 .5 CRCL 36 (SUTURE) ×1 IMPLANT
SUT PROLENE 5 0 C 1 36 (SUTURE) IMPLANT
SUT PROLENE 6 0 C 1 30 (SUTURE) IMPLANT
SUT PROLENE 6 0 CC (SUTURE) ×15 IMPLANT
SUT PROLENE 8 0 BV175 6 (SUTURE) ×6 IMPLANT
SUT PROLENE BLUE 7 0 (SUTURE) ×3 IMPLANT
SUT SILK  1 MH (SUTURE)
SUT SILK 1 MH (SUTURE) IMPLANT
SUT SILK 2 0 SH CR/8 (SUTURE) ×3 IMPLANT
SUT SILK 3 0 SH CR/8 (SUTURE) IMPLANT
SUT STEEL 6MS V (SUTURE) ×6 IMPLANT
SUT STEEL SZ 6 DBL 3X14 BALL (SUTURE) ×3 IMPLANT
SUT VIC AB 1 CTX 36 (SUTURE) ×4
SUT VIC AB 1 CTX36XBRD ANBCTR (SUTURE) ×2 IMPLANT
SUT VIC AB 2-0 CT1 27 (SUTURE) ×2
SUT VIC AB 2-0 CT1 TAPERPNT 27 (SUTURE) ×1 IMPLANT
SUT VIC AB 2-0 CTX 27 (SUTURE) IMPLANT
SUT VIC AB 3-0 X1 27 (SUTURE) IMPLANT
SUTURE E-PAK OPEN HEART (SUTURE) ×3 IMPLANT
SYSTEM SAHARA CHEST DRAIN ATS (WOUND CARE) ×3 IMPLANT
TAPE CLOTH SURG 4X10 WHT LF (GAUZE/BANDAGES/DRESSINGS) ×3 IMPLANT
TAPE PAPER 2X10 WHT MICROPORE (GAUZE/BANDAGES/DRESSINGS) ×3 IMPLANT
TOWEL OR 17X24 6PK STRL BLUE (TOWEL DISPOSABLE) ×6 IMPLANT
TOWEL OR 17X26 10 PK STRL BLUE (TOWEL DISPOSABLE) ×6 IMPLANT
TRAY FOLEY IC TEMP SENS 16FR (CATHETERS) ×3 IMPLANT
TUBING INSUFFLATION (TUBING) ×3 IMPLANT
UNDERPAD 30X30 INCONTINENT (UNDERPADS AND DIAPERS) ×3 IMPLANT
WATER STERILE IRR 1000ML POUR (IV SOLUTION) ×6 IMPLANT

## 2014-08-24 SURGICAL SUPPLY — 11 items
CATH INFINITI 5 FR JL3.5 (CATHETERS) ×2 IMPLANT
CATH INFINITI 5FR ANG PIGTAIL (CATHETERS) ×2 IMPLANT
CATH INFINITI JR4 5F (CATHETERS) ×2 IMPLANT
DEVICE RAD COMP TR BAND LRG (VASCULAR PRODUCTS) ×2 IMPLANT
GLIDESHEATH SLEND A-KIT 6F 22G (SHEATH) ×2 IMPLANT
KIT HEART LEFT (KITS) ×2 IMPLANT
PACK CARDIAC CATHETERIZATION (CUSTOM PROCEDURE TRAY) ×2 IMPLANT
SYR MEDRAD MARK V 150ML (SYRINGE) ×2 IMPLANT
TRANSDUCER W/STOPCOCK (MISCELLANEOUS) ×2 IMPLANT
TUBING CIL FLEX 10 FLL-RA (TUBING) ×2 IMPLANT
WIRE SAFE-T 1.5MM-J .035X260CM (WIRE) ×2 IMPLANT

## 2014-08-24 NOTE — ED Provider Notes (Signed)
CSN: [id number]     Arrival date & time 08/24/14  1331 History   First MD Initiated Contact with Patient 08/24/14 1407     Chief Complaint  Patient presents with  . Chest Pain      HPI Pt with intermittent CP and SOB over the past 7 days. Waxing and waning. No hx of anemia. No hx of CAD. No prior heart cath. Went to PCP today and was told the ecg was abnormal and changed from prior ecg and it was recommended the pt come to the ER for evaluation. No cough. Some jaw and anterior neck pain. Denies back pain. Pain and discomfort is moderate at this time   Past Medical History  Diagnosis Date  . Hypertension   . History of carcinoma in situ of breast 1988  . Acute biliary pancreatitis 07/2002    thia was in 07/2004:she still has pseudocyst in tail of pancreas measuring 54 x 33 mm   . Upper GI bleed September2004    secondary to gastritis  . Diabetes mellitus   . Adrenal adenoma     bilateral  . Detached retina   . Chronic pancreatitis   . Hyperlipidemia   . Osteopenia   . IBS (irritable bowel syndrome)   . Legally blind   . Anxiety   . Osteoarthritis   . Diverticulosis    Past Surgical History  Procedure Laterality Date  . Colonoscopy  08/15/05    few tiny diverticula at sigmoid colon/external hemorrhoids but no polyps  . Right mastectomy    . Ectopic pregnancy surgery  1950's  . Tacking up of her bladder    . Laparoscopic cholecystectomy    . Ercp with sphincterotomy  07/2002  . Pancreatic pseudocyst drainage  June2004    drained percutaneously   . Esophagogastroduodenoscopy       Gastritis of body.  Otherwise normal  . Colonoscopy  01/08/2012    WIO:MBTDHRC diverticulosis. Next colonoscopy in 12/2016  . Esophagogastroduodenoscopy  01/08/2012    RMR: Few scattered gastric erosions of uncertain significance-status post biopsy. Minimal chronic inflammation, no H.pylori   Family History  Problem Relation Age of Onset  . Colon cancer Father 49    passed away in his 64's  .  Colon cancer Brother 44    surgery inhis 50's doing well now  . Heart attack Brother     passed away age 44  . Pancreatitis Brother   . Ovarian cancer Mother     passed  . Kidney disease Daughter   . Leukemia Son    History  Substance Use Topics  . Smoking status: Never Smoker   . Smokeless tobacco: Not on file  . Alcohol Use: No   OB History    Gravida Para Term Preterm AB TAB SAB Ectopic Multiple Living            5     Review of Systems  All other systems reviewed and are negative.     Allergies  Calcium-containing compounds; Raloxifene; and Vitamin d analogs  Home Medications   Prior to Admission medications   Medication Sig Start Date End Date Taking? Authorizing Provider  acetaminophen (TYLENOL) 500 MG tablet Take 500-1,000 mg by mouth 2 (two) times daily as needed. Pain.    Historical Provider, MD  atenolol (TENORMIN) 25 MG tablet Take 1 tablet (25 mg total) by mouth daily. 04/29/13   Susy Frizzle, MD  B-D INS SYRINGE 0.5CC/31GX5/16 31G X 5/16" 0.5 ML MISC USE  AS DIRECTED 07/24/14   Susy Frizzle, MD  Blood Glucose Monitoring Suppl (BLOOD GLUCOSE METER) kit Use as instructed 04/11/13   Susy Frizzle, MD  calcium carbonate (TUMS EX) 750 MG chewable tablet Chew 1 tablet by mouth daily.    Historical Provider, MD  Carbamide Peroxide (EAR DROPS OT) Place 2 drops in ear(s) daily as needed. Ear pain.    Historical Provider, MD  cetirizine (ZYRTEC) 10 MG tablet TAKE ONE TABLET BY MOUTH ONCE DAILY 06/19/14   Susy Frizzle, MD  CREON [phone] UNITS CPEP 2 w/ meals, 1 w/ snacks 08/22/14   Susy Frizzle, MD  cycloSPORINE (RESTASIS) 0.05 % ophthalmic emulsion Place 1 drop into both eyes 3 (three) times daily.     Historical Provider, MD  enalapril (VASOTEC) 20 MG tablet Take 1 tablet (20 mg total) by mouth daily. 08/02/14   Susy Frizzle, MD  esomeprazole (NEXIUM) 40 MG capsule Take 1 capsule (40 mg total) by mouth daily before breakfast. 05/26/13   Mahala Menghini, PA-C   esomeprazole (NEXIUM) 40 MG packet Take 40 mg by mouth daily before breakfast. 10/28/13   Daneil Dolin, MD  glucose blood (ONE TOUCH ULTRA TEST) test strip Use to monitor FSBS 5x daily for fluctuating CBG. DX: E11.65 07/05/14   Susy Frizzle, MD  hydrochlorothiazide (HYDRODIURIL) 25 MG tablet TAKE ONE TABLET BY MOUTH ONCE DAILY 08/01/14   Susy Frizzle, MD  hyoscyamine (LEVSIN SL) 0.125 MG SL tablet Place 1 tablet (0.125 mg total) under the tongue every 6 (six) hours as needed for cramping or diarrhea or loose stools. 08/26/12   Mahala Menghini, PA-C  INS SYRINGE/NEEDLE 1CC/28G (MONOJECT ULTRA COMFORT SYRINGE) 28G X 1/2" 1 ML MISC Inject insulin BID 04/29/13   Susy Frizzle, MD  LORazepam (ATIVAN) 0.5 MG tablet TAKE ONE TABLET BY MOUTH EVERY 8 HOURS AS NEEDED FOR ANXIETY OR AGITATION 08/07/14   Susy Frizzle, MD  lovastatin (MEVACOR) 20 MG tablet Take 1 tablet (20 mg total) by mouth at bedtime. 08/01/14   Susy Frizzle, MD  metFORMIN (GLUCOPHAGE-XR) 500 MG 24 hr tablet TAKE ONE TABLET BY MOUTH ONCE DAILY WITH BREAKFAST 07/24/14   Susy Frizzle, MD  Multiple Vitamin (MULTIVITAMIN) capsule Take 1 capsule by mouth daily.    Historical Provider, MD  NOVOLIN N RELION 100 UNIT/ML injection INJECT 42 UNITS SUBCUTANEOUSLY IN THE MORNING AND 4 UNITS IN THE EVENING 05/15/14   Susy Frizzle, MD  promethazine (PHENERGAN) 12.5 MG tablet Take 1 tablet (12.5 mg total) by mouth every 8 (eight) hours as needed for nausea or vomiting. 08/22/14   Susy Frizzle, MD  zolpidem (AMBIEN) 10 MG tablet TAKE ONE TABLET BY MOUTH AT BEDTIME AS NEEDED FOR  INSOMNIA 08/07/14   Susy Frizzle, MD   BP 165/76 mmHg  Pulse 94  Temp(Src) 98.6 F (37 C) (Oral)  Resp 18  Ht _0  (1.6 m)  Wt 140 lb (63.504 kg)  BMI 24.81 kg/m2  SpO2 99% Physical Exam  Constitutional: She is oriented to person, place, and time. She appears well-developed and well-nourished. No distress.  HENT:  Head: Normocephalic and atraumatic.   Eyes: EOM are normal.  Neck: Normal range of motion.  Cardiovascular: Normal rate, regular rhythm and normal heart sounds.   Pulmonary/Chest: Effort normal and breath sounds normal.  Abdominal: Soft. She exhibits no distension. There is no tenderness.  Musculoskeletal: Normal range of motion.  Neurological: She is alert and  oriented to person, place, and time.  Skin: Skin is warm and dry.  Psychiatric: She has a normal mood and affect. Judgment normal.  Nursing note and vitals reviewed.   ED Course  Procedures (including critical care time)  CRITICAL CARE Performed by: Hoy Morn Total critical care time: 35 Critical care time was exclusive of separately billable procedures and treating other patients. Critical care was necessary to treat or prevent imminent or life-threatening deterioration. Critical care was time spent personally by me on the following activities: development of treatment plan with patient and/or surrogate as well as nursing, discussions with consultants, evaluation of patient's response to treatment, examination of patient, obtaining history from patient or surrogate, ordering and performing treatments and interventions, ordering and review of laboratory studies, ordering and review of radiographic studies, pulse oximetry and re-evaluation of patient's condition.   Labs Review Labs Reviewed  CBC WITH DIFFERENTIAL/PLATELET  COMPREHENSIVE METABOLIC PANEL  TROPONIN I  PROTIME-INR  APTT    Imaging Review Dg Chest 2 View  08/24/2014   CLINICAL DATA:  Generalized chest pain yesterday. No pain today. Sudden onset of weakness.  EXAM: CHEST - 2 VIEW  COMPARISON:  None.  FINDINGS: The heart size is normal. Emphysematous changes are evident. Mild interstitial coarsening is chronic. Surgical clips are present in the right axilla and chest wall.  IMPRESSION: 1. Emphysema. 2. No acute cardiopulmonary disease.   Electronically Signed   By: San Morelle M.D.   On:  08/24/2014 14:09  I personally reviewed the imaging tests through PACS system I reviewed available ER/hospitalization records through the EMR   EKG Interpretation #1 Date/Time:  Thursday August 24 2014 13:37:34 EDT Ventricular Rate:  93 PR Interval:  146 QRS Duration: 74 QT Interval:  336 QTC Calculation: 417 R Axis:   31 Text Interpretation:  Normal sinus rhythm inferior lateral ST depression  No old tracing to compare Confirmed by Floy Riegler  MD, Kamaree Wheatley ([id number]) on  08/24/2014 2:08:26 PM      EKG Interpretation #2  Date/Time:  Thursday August 24 2014 14:16:40 EDT Ventricular Rate:  120 PR Interval:  149 QRS Duration: 85 QT Interval:  291 QTC Calculation: 411 R Axis:   51 Text Interpretation:  Sinus tachycardia Repol abnrm, severe global ischemia (LM/MVD) subtle st changes as compared to prior.  concerning for dynamic ecg changes in the setting of ACS Confirmed by Mikhaila Roh  MD, Lennette Bihari ([id number]) on 08/24/2014 2:24:33 PM        MDM   Final diagnoses:  Acute coronary syndrome   Suspect ACS. Ischemic ecg withOUT ST elevation at this time. May represent LMCA disease given elevation in aVR. Doubt PE. Doubt dissection  2:37 PM Spoke with Dr Debara Pickett who accepts the patient in transfer. Will work towards transfer to [facility] and symptom management at this time. Will monitor closely. Nitroglycerin drip recommended by cardiology as well. Still with CP at this time. Two IVs. Heparin drip. If delay in bed assignment will move to transfer ER to ER as pt will likely need heart cath today    Jola Schmidt, MD 08/24/14 1443

## 2014-08-24 NOTE — Progress Notes (Signed)
Subjective:    Patient ID: Misty Edwards, female    DOB: 1926-12-06, 81 y.o.   MRN: [id number]  HPI Patient is having very unusual symptoms. Symptoms began about one week ago. They start with severe pain in her frontal sinus area and in her for head. This radiates into both the ears and then into her right jaw.  The pain is intense. It improves if she goes and lies down. If she does not lie down, the pain then radiates from her jaw into her chest. She does report dyspnea on exertion and decreased stamina. She also has vague central chest discomfort that she thinks is indigestion which is associated with the headache and with the shortness of breath. She denies any photophobia or phonophobia. She denies any jaw location. She denies any pain with chewing. She denies any rhinorrhea or postnasal drip. She denies any cough or sore throat. She denies any vision changes. Past Medical History  Diagnosis Date  . Hypertension   . History of carcinoma in situ of breast 1988  . Acute biliary pancreatitis 07/2002    thia was in 07/2004:she still has pseudocyst in tail of pancreas measuring 54 x 33 mm   . Upper GI bleed September2004    secondary to gastritis  . Diabetes mellitus   . Adrenal adenoma     bilateral  . Detached retina   . Chronic pancreatitis   . Hyperlipidemia   . Osteopenia   . IBS (irritable bowel syndrome)   . Legally blind   . Anxiety   . Osteoarthritis   . Diverticulosis    Past Surgical History  Procedure Laterality Date  . Colonoscopy  08/15/05    few tiny diverticula at sigmoid colon/external hemorrhoids but no polyps  . Right mastectomy    . Ectopic pregnancy surgery  1950's  . Tacking up of her bladder    . Laparoscopic cholecystectomy    . Ercp with sphincterotomy  07/2002  . Pancreatic pseudocyst drainage  June2004    drained percutaneously   . Esophagogastroduodenoscopy       Gastritis of body.  Otherwise normal  . Colonoscopy  01/08/2012    IOX:[id number]  diverticulosis. Next colonoscopy in 12/2016  . Esophagogastroduodenoscopy  01/08/2012    RMR: Few scattered gastric erosions of uncertain significance-status post biopsy. Minimal chronic inflammation, no H.pylori   Current Outpatient Prescriptions on File Prior to Visit  Medication Sig Dispense Refill  . acetaminophen (TYLENOL) 500 MG tablet Take 500-1,000 mg by mouth 2 (two) times daily as needed. Pain.    Marland Kitchen atenolol (TENORMIN) 25 MG tablet Take 1 tablet (25 mg total) by mouth daily. 90 tablet 4  . B-D INS SYRINGE 0.5CC/31GX5/16 31G X 5/16" 0.5 ML MISC USE AS DIRECTED 500 each 5  . Blood Glucose Monitoring Suppl (BLOOD GLUCOSE METER) kit Use as instructed 1 each 0  . calcium carbonate (TUMS EX) 750 MG chewable tablet Chew 1 tablet by mouth daily.    . Carbamide Peroxide (EAR DROPS OT) Place 2 drops in ear(s) daily as needed. Ear pain.    . cetirizine (ZYRTEC) 10 MG tablet TAKE ONE TABLET BY MOUTH ONCE DAILY 90 tablet 3  . CREON 24000 UNITS CPEP 2 w/ meals, 1 w/ snacks 720 capsule 3  . cycloSPORINE (RESTASIS) 0.05 % ophthalmic emulsion Place 1 drop into both eyes 3 (three) times daily.     . enalapril (VASOTEC) 20 MG tablet Take 1 tablet (20 mg total) by mouth daily. 90 tablet  3  . esomeprazole (NEXIUM) 40 MG capsule Take 1 capsule (40 mg total) by mouth daily before breakfast. 30 capsule 11  . esomeprazole (NEXIUM) 40 MG packet Take 40 mg by mouth daily before breakfast. 90 each 3  . glucose blood (ONE TOUCH ULTRA TEST) test strip Use to monitor FSBS 5x daily for fluctuating CBG. DX: E11.65 450 each 3  . hydrochlorothiazide (HYDRODIURIL) 25 MG tablet TAKE ONE TABLET BY MOUTH ONCE DAILY 90 tablet 1  . hyoscyamine (LEVSIN SL) 0.125 MG SL tablet Place 1 tablet (0.125 mg total) under the tongue every 6 (six) hours as needed for cramping or diarrhea or loose stools. 360 tablet 1  . hyoscyamine (LEVSIN SL) 0.125 MG SL tablet Place 1 tablet (0.125 mg total) under the tongue 4 (four) times daily -   before meals and at bedtime. prn 180 tablet 0  . INS SYRINGE/NEEDLE 1CC/28G (MONOJECT ULTRA COMFORT SYRINGE) 28G X 1/2" 1 ML MISC Inject insulin BID 200 each 4  . LORazepam (ATIVAN) 0.5 MG tablet TAKE ONE TABLET BY MOUTH EVERY 8 HOURS AS NEEDED FOR ANXIETY OR AGITATION 30 tablet 0  . lovastatin (MEVACOR) 20 MG tablet Take 1 tablet (20 mg total) by mouth at bedtime. 30 tablet 5  . metFORMIN (GLUCOPHAGE-XR) 500 MG 24 hr tablet TAKE ONE TABLET BY MOUTH ONCE DAILY WITH BREAKFAST 90 tablet 3  . Multiple Vitamin (MULTIVITAMIN) capsule Take 1 capsule by mouth daily.    Marland Kitchen NOVOLIN N RELION 100 UNIT/ML injection INJECT 42 UNITS SUBCUTANEOUSLY IN THE MORNING AND 4 UNITS IN THE EVENING 160 mL 3  . promethazine (PHENERGAN) 12.5 MG tablet Take 1 tablet (12.5 mg total) by mouth every 8 (eight) hours as needed for nausea or vomiting. 10 tablet 0  . zolpidem (AMBIEN) 10 MG tablet TAKE ONE TABLET BY MOUTH AT BEDTIME AS NEEDED FOR  INSOMNIA 30 tablet 0   No current facility-administered medications on file prior to visit.   Allergies  Allergen Reactions  . Calcium-Containing Compounds Nausea Only  . Raloxifene Itching    Face and eyes burning  . Vitamin D Analogs Nausea Only   History   Social History  . Marital Status: Married    Spouse Name: N/A  . Number of Children: 5  . Years of Education: N/A   Occupational History  . homemaker    Social History Main Topics  . Smoking status: Never Smoker   . Smokeless tobacco: Not on file  . Alcohol Use: No  . Drug Use: No  . Sexual Activity: Not on file   Other Topics Concern  . Not on file   Social History Narrative     Review of Systems  All other systems reviewed and are negative.      Objective:   Physical Exam  Constitutional: She is oriented to person, place, and time. She appears well-developed and well-nourished. No distress.  Cardiovascular: Normal rate, regular rhythm and normal heart sounds.   Pulmonary/Chest: Effort normal and  breath sounds normal.  Neurological: She is alert and oriented to person, place, and time. She has normal reflexes. She displays normal reflexes. No cranial nerve deficit. She exhibits normal muscle tone. Coordination normal.  Skin: She is not diaphoretic.          Assessment & Plan:  Headache disorder  Other chest pain - Plan: EKG 12-Lead  Symptoms are confusing and difficult to relate. Symptoms certainly seem to be due to stress in caring for her husband with dementia. It  is possible (anxiety and tension headaches. Differential diagnosis for headaches including tension headaches, migraine, space-occupying lesion in the brain, temporal arteritis,, trigeminal neuralgia. However none of these headache disorders should cause dyspnea on exertion or chest discomfort. I believe the patient is mainly having tension headaches. I believe the chest discomfort is likely an anxiety attack. However given her age and her diabetes, she is certainly at risk for cardiovascular disease.   However the patient's EKG today is abnormal. She has ST segment depression in lead 1 and aVL. In aVL she is almost having T-wave inversions. She also has diffuse ST segment depression in lead V4 V5 and V6. Given her age, her hypertension, and her diabetes, the likelihood of cardiovascular disease is too high to be ignored. Furthermore she is having the symptoms at rest unrelated to exertion. Therefore I recommended going to the emergency room for a 24-hour ACS rule out. Once we are sure that her heart is not the root cause of her chest pain we can certainly turn our focus to treating her headaches like a tension headaches which I believe that they are.

## 2014-08-24 NOTE — ED Notes (Signed)
Report attempted. Bed may need to be changed. My phone number was given to TaTa, RN and she will call me back.

## 2014-08-24 NOTE — Progress Notes (Signed)
ANTICOAGULATION CONSULT NOTE - Initial Consult  Pharmacy Consult for Heparin Indication: chest pain/ACS  Allergies  Allergen Reactions  . Calcium-Containing Compounds Nausea Only  . Raloxifene Itching    Face and eyes burning  . Vitamin D Analogs Nausea Only   Patient Measurements: Height: 5\' 3"  (160 cm) Weight: 140 lb (63.504 kg) IBW/kg (Calculated) : 52.4 HEPARIN DW (KG): 63.5  Vital Signs: Temp: 98.6 F (37 C) (06/09 1343) Temp Source: Oral (06/09 1343) BP: 165/76 mmHg (06/09 1343) Pulse Rate: 94 (06/09 1343)  Labs: No results for input(s): HGB, HCT, PLT, APTT, LABPROT, INR, HEPARINUNFRC, CREATININE, CKTOTAL, CKMB, TROPONINI in the last 72 hours.  CrCl cannot be calculated (Patient has no serum creatinine result on file.).  Medical History: Past Medical History  Diagnosis Date  . Hypertension   . History of carcinoma in situ of breast 1988  . Acute biliary pancreatitis 07/2002    thia was in 07/2004:she still has pseudocyst in tail of pancreas measuring 54 x 33 mm   . Upper GI bleed September2004    secondary to gastritis  . Diabetes mellitus   . Adrenal adenoma     bilateral  . Detached retina   . Chronic pancreatitis   . Hyperlipidemia   . Osteopenia   . IBS (irritable bowel syndrome)   . Legally blind   . Anxiety   . Osteoarthritis   . Diverticulosis    Medications:  Reviewed  Assessment: 81yo female presents c/o chest pain.  Asked to initiate Heparin for ACS.    Goal of Therapy:  Heparin level 0.3-0.7 units/ml Monitor platelets by anticoagulation protocol: Yes   Plan:  Heparin 4000 units IV now x 1 Heparin infusion at 650 units/hr Heparin level in 6 hrs then daily CBC daily while on Heparin  [place] Crane, Kitana Gage A 08/24/2014,2:27 PM

## 2014-08-24 NOTE — ED Notes (Signed)
[facility] arrived and HR increased to 122. Labetalol 5mg  IV administered and HR decreased to 80s. Pt also stated that pain to her head eased from 2 down to 0. NAD at time of transport.

## 2014-08-24 NOTE — ED Notes (Signed)
Pt states intermittent symptoms began 1.5 weeks ago. Pt states the pain starts in her head and then radiates to her jaws, and neck, then her chest. All of the pain is described as "exploding". Pt states the only pain today has been to her head which began at ~1100. Pain to her had subsided after given Morphine 4mg . Pt complains of no pain at this time. Last episode of head with chest pain as well was yesterday.

## 2014-08-24 NOTE — Brief Op Note (Signed)
08/24/2014      [address]       [place] [address]             [phone]     08/24/2014  10:30 PM  PATIENT:  Misty Edwards  80 y.o. female  PRE-OPERATIVE DIAGNOSIS:  cad  POST-OPERATIVE DIAGNOSIS:  CAD  PROCEDURE:  Procedure(s): EMERGENCY CORONARY ARTERY BYPASS GRAFTING (CABG) x three, using left internal mammary artery and right leg greater saphenous vein harvested endoscopically. (LIMA-LAD; SVG-OM; SVG-PD) TRANSESOPHAGEAL ECHOCARDIOGRAM (TEE)   SURGEON:  Surgeon(s): Ivin Poot, MD  PHYSICIAN ASSISTANT: Jamaiya Tunnell PA-C  ANESTHESIA:   general  PATIENT CONDITION:  ICU - intubated and hemodynamically stable.  PRE-OPERATIVE WEIGHT: 63kg  EBL : SEE ANEST/PERFUSION RECORDS  COMPLICATIONS: NO KNOWN

## 2014-08-24 NOTE — Anesthesia Preprocedure Evaluation (Addendum)
Anesthesia Evaluation  Patient identified by MRN, date of birth, ID band Patient awake    Reviewed: Allergy & Precautions, NPO status , Patient's Chart, lab work & pertinent test results  Airway Mallampati: II  TM Distance: >3 FB Neck ROM: Full    Dental no notable dental hx.    Pulmonary neg pulmonary ROS,  breath sounds clear to auscultation  Pulmonary exam normal       Cardiovascular hypertension, Pt. on medications and Pt. on home beta blockers + CAD and + Past MI Normal cardiovascular examRhythm:Regular Rate:Normal     Neuro/Psych Anxiety negative neurological ROS  negative psych ROS   GI/Hepatic negative GI ROS, Neg liver ROS, GERD-  ,  Endo/Other  diabetes, Type 2, Insulin Dependent, Oral Hypoglycemic Agents  Renal/GU negative Renal ROS  negative genitourinary   Musculoskeletal negative musculoskeletal ROS (+) Arthritis -,   Abdominal   Peds negative pediatric ROS (+)  Hematology  (+) anemia , Platelets 141K   Anesthesia Other Findings   Reproductive/Obstetrics negative OB ROS                            Anesthesia Physical Anesthesia Plan  ASA: IV and emergent  Anesthesia Plan: General   Post-op Pain Management:    Induction: Intravenous  Airway Management Planned: Oral ETT  Additional Equipment: Arterial line, TEE, PA Cath, Ultrasound Guidance Line Placement and CVP  Intra-op Plan:   Post-operative Plan: Post-operative intubation/ventilation  Informed Consent: I have reviewed the patients History and Physical, chart, labs and discussed the procedure including the risks, benefits and alternatives for the proposed anesthesia with the patient or authorized representative who has indicated his/her understanding and acceptance.   Dental advisory given  Plan Discussed with: CRNA and Surgeon  Anesthesia Plan Comments:        Anesthesia Quick Evaluation

## 2014-08-24 NOTE — ED Notes (Signed)
PT c/o left sided chest pain intermittently with exertion and SOB on exertion x10 days. PT seen at PCP this am and told to come to ED for eval. PT denies any CP at this time.

## 2014-08-24 NOTE — Anesthesia Procedure Notes (Signed)
Procedure Name: Intubation Date/Time: 08/24/2014 6:44 PM Performed by: Shirlyn Goltz Pre-anesthesia Checklist: Emergency Drugs available, Patient identified, Suction available and Patient being monitored Patient Re-evaluated:Patient Re-evaluated prior to inductionOxygen Delivery Method: Circle system utilized Preoxygenation: Pre-oxygenation with 100% oxygen Intubation Type: IV induction Ventilation: Mask ventilation without difficulty Laryngoscope Size: Miller and 2 Grade View: Grade I Tube type: Oral Tube size: 8.0 mm Number of attempts: 1 Airway Equipment and Method: Stylet Placement Confirmation: ETT inserted through vocal cords under direct vision,  positive ETCO2 and breath sounds checked- equal and bilateral Secured at: 22 cm Tube secured with: Tape Dental Injury: Teeth and Oropharynx as per pre-operative assessment

## 2014-08-24 NOTE — ED Notes (Signed)
CRITICAL VALUE ALERT  Critical value received:  Troponin 3.44  Date of notification:  08/24/2014  Time of notification: 2258  Critical value read back: yes  Nurse who received alert:  Di Kindle, RN  MD notified (1st page):  Venora Maples  Time of first page: 1535  MD notified (2nd page):  Time of second page:  Responding MD: Venora Maples  Time MD responded:  Venora Maples

## 2014-08-24 NOTE — Progress Notes (Signed)
Echocardiogram Echocardiogram Transesophageal has been performed.  Joelene Millin 08/24/2014, 7:29 PM

## 2014-08-24 NOTE — ED Notes (Signed)
Report given to [place] from [facility]

## 2014-08-24 NOTE — OR Nursing (Signed)
23:05 - 1st call to SICU charge nurse, 23:45 - 2nd call to SICU charge nurse

## 2014-08-24 NOTE — H&P (Signed)
History and Physical   Patient ID: Misty Edwards MRN: [id number], DOB/AGE: 05/31/1926 81 y.o. Date of Encounter: 08/24/2014  Primary Physician: Misty Fraction, MD Primary Cardiologist: Misty Edwards, Misty Edwards  Chief Complaint:  Non-STEMI  HPI: Misty Edwards is a 81 y.o. female with no history of CAD. She has a history of hypertension, upper GI bleed secondary to gastritis, diabetes, chronic pancreatitis with pseudocyst and IBS.  For the last 7-9 days, she has had intermittent chest pain. She has been getting episodes consistently with exertion, and sometimes at rest. She she noticed increasing dyspnea on exertion and her activity was significantly limited by this. She had some nausea but associated this with low blood sugars. She was not diaphoretic. She has not had any recent black tarry stools or bleeding issues. She denies orthopnea, PND or lower extremity edema.  She went to see Misty Edwards today and was told her ECG was newly abnormal. She was sent to the emergency room where she was still having some chest discomfort. Her ECG showed diffuse ST depression in the inferior and lateral leads. She was treated medically with aspirin, heparin bolus, IV Lopressor, morphine and IV nitroglycerin. She was transferred to [facility]. Her ECG was still abnormal but her pain improved. Because her ECG was still abnormal she was taken directly to the cath lab.   Past Medical History  Diagnosis Date  . Hypertension   . History of carcinoma in situ of breast 1988  . Acute biliary pancreatitis 07/2002    thia was in 07/2004:she still has pseudocyst in tail of pancreas measuring 54 x 33 mm   . Upper GI bleed September2004    secondary to gastritis  . Diabetes mellitus   . Adrenal adenoma     bilateral  . Detached retina   . Chronic pancreatitis   . Hyperlipidemia   . Osteopenia   . IBS (irritable bowel syndrome)   . Legally blind   . Anxiety   . Osteoarthritis   .  Diverticulosis     Surgical History:  Past Surgical History  Procedure Laterality Date  . Colonoscopy  08/15/05    few tiny diverticula at sigmoid colon/external hemorrhoids but no polyps  . Right mastectomy    . Ectopic pregnancy surgery  1950's  . Tacking up of her bladder    . Laparoscopic cholecystectomy    . Ercp with sphincterotomy  07/2002  . Pancreatic pseudocyst drainage  June2004    drained percutaneously   . Esophagogastroduodenoscopy       Gastritis of body.  Otherwise normal  . Colonoscopy  01/08/2012    [id number]:[id number] diverticulosis. Next colonoscopy in 12/2016  . Esophagogastroduodenoscopy  01/08/2012    RMR: Few scattered gastric erosions of uncertain significance-status post biopsy. Minimal chronic inflammation, no H.pylori     I have reviewed the patient's current medications. Medication Sig  acetaminophen (TYLENOL) 500 MG tablet Take 500-1,000 mg by mouth 2 (two) times daily as needed. Pain.  atenolol (TENORMIN) 25 MG tablet Take 1 tablet (25 mg total) by mouth daily.  B-D INS SYRINGE 0.5CC/31GX5/16 31G X 5/16" 0.5 ML MISC USE AS DIRECTED  Blood Glucose Monitoring Suppl (BLOOD GLUCOSE METER) kit Use as instructed  calcium carbonate (TUMS EX) 750 MG chewable tablet Chew 1 tablet by mouth daily.  Carbamide Peroxide (EAR DROPS OT) Place 2 drops in ear(s) daily as needed. Ear pain.  cetirizine (ZYRTEC) 10 MG tablet TAKE ONE TABLET BY MOUTH ONCE DAILY  CREON 24000 UNITS CPEP 2 w/ meals, 1 w/ snacks  cycloSPORINE (RESTASIS) 0.05 % ophthalmic emulsion Place 1 drop into both eyes 3 (three) times daily.   enalapril (VASOTEC) 20 MG tablet Take 1 tablet (20 mg total) by mouth daily.  esomeprazole (NEXIUM) 40 MG capsule Take 1 capsule (40 mg total) by mouth daily before breakfast.  esomeprazole (NEXIUM) 40 MG packet Take 40 mg by mouth daily before breakfast.  glucose blood (ONE TOUCH ULTRA TEST) test strip Use to monitor FSBS 5x daily for fluctuating CBG. DX: E11.65    hydrochlorothiazide (HYDRODIURIL) 25 MG tablet TAKE ONE TABLET BY MOUTH ONCE DAILY  hyoscyamine (LEVSIN SL) 0.125 MG SL tablet Place 1 tablet (0.125 mg total) under the tongue every 6 (six) hours as needed for cramping or diarrhea or loose stools.  INS SYRINGE/NEEDLE 1CC/28G (MONOJECT ULTRA COMFORT SYRINGE) 28G X 1/2" 1 ML MISC Inject insulin BID  LORazepam (ATIVAN) 0.5 MG tablet TAKE ONE TABLET BY MOUTH EVERY 8 HOURS AS NEEDED FOR ANXIETY OR AGITATION  lovastatin (MEVACOR) 20 MG tablet Take 1 tablet (20 mg total) by mouth at bedtime.  metFORMIN (GLUCOPHAGE-XR) 500 MG 24 hr tablet TAKE ONE TABLET BY MOUTH ONCE DAILY WITH BREAKFAST  Multiple Vitamin (MULTIVITAMIN) capsule Take 1 capsule by mouth daily.  NOVOLIN N RELION 100 UNIT/ML injection INJECT 42 UNITS SUBCUTANEOUSLY IN THE MORNING AND 4 UNITS IN THE EVENING  promethazine (PHENERGAN) 12.5 MG tablet Take 1 tablet (12.5 mg total) by mouth every 8 (eight) hours as needed for nausea or vomiting.  zolpidem (AMBIEN) 10 MG tablet TAKE ONE TABLET BY MOUTH AT BEDTIME AS NEEDED FOR  INSOMNIA   Scheduled Meds:  Continuous Infusions: . heparin 650 Units/hr (08/24/14 1428)  . nitroGLYCERIN 20 mcg/min (08/24/14 1708)   PRN Meds:.furosemide, lidocaine (PF), nitroGLYCERIN, Radial Cocktail  Allergies:  Allergies  Allergen Reactions  . Calcium-Containing Compounds Nausea Only  . Raloxifene Itching    Face and eyes burning  . Vitamin D Analogs Nausea Only    History   Social History  . Marital Status: Married    Spouse Name: N/A  . Number of Children: 5  . Years of Education: N/A   Occupational History  . homemaker    Social History Main Topics  . Smoking status: Never Smoker   . Smokeless tobacco: Not on file  . Alcohol Use: No  . Drug Use: No  . Sexual Activity: No   Other Topics Concern  . Not on file   Social History Narrative   Lives in [place].    Family History  Problem Relation Age of Onset  . Colon cancer Father 24     passed away in his 53's  . Colon cancer Brother 32    surgery inhis 50's doing well now  . Heart attack Brother     passed away age 2  . Pancreatitis Brother   . Ovarian cancer Mother     passed  . Kidney disease Daughter   . Leukemia Son    Family Status  Relation Status Death Age  . Mother Deceased   . Father Deceased   . Brother Deceased     Review of Systems: Minimal cough, rare whitish sputum, no fevers.  Full 14-point review of systems otherwise negative except as noted above.  Physical Exam: Blood pressure 158/78, pulse 78, temperature 98.4 F (36.9 C), temperature source Oral, resp. rate 28, height _0  (1.6 m), weight 140 lb (63.504 kg), SpO2 92 %. General: Well developed,  well nourished, elderly,female in no acute distress. Head: Normocephalic, atraumatic, sclera non-icteric, no xanthomas, nares are without discharge. Dentition: Poor Neck: No carotid bruits. JVD not elevated. No thyromegally Lungs: Good expansion bilaterally. without wheezes or rhonchi. Rales are noted Heart: Regular rate and rhythm with S1 S2.  No S3 or S4.  No murmur, no rubs, or gallops appreciated. Abdomen: Soft, non-tender, non-distended with normoactive bowel sounds. No hepatomegaly. No rebound/guarding. No obvious abdominal masses. Msk:  Strength and tone appear normal for age. No joint deformities or effusions, no spine or costo-vertebral angle tenderness. Extremities: No clubbing or cyanosis. No edema.  Distal pedal pulses are 2+ in 4 extrem Neuro: Alert and oriented X 3. Moves all extremities spontaneously. No focal deficits noted. Psych:  Responds to questions appropriately with a normal affect. Skin: No rashes or lesions noted  Labs:   Lab Results  Component Value Date   WBC 7.1 08/24/2014   HGB 11.1* 08/24/2014   HCT 33.9* 08/24/2014   MCV 82.1 08/24/2014   PLT 141* 08/24/2014    Recent Labs  08/24/14 1426  INR 1.05     Recent Labs Lab 08/24/14 1426  NA 126*  K 4.5   CL 90*  CO2 25  BUN 22*  CREATININE 0.97  CALCIUM 9.3  PROT 7.4  BILITOT 0.9  ALKPHOS 37*  ALT 37  AST 62*  GLUCOSE 213*    Recent Labs  08/24/14 1426  TROPONINI 3.44*   Lab Results  Component Value Date   CHOL 126 01/13/2014   HDL 56 01/13/2014   LDLCALC 48 01/13/2014   TRIG 112 01/13/2014    Radiology/Studies: Dg Chest 2 View 08/24/2014   CLINICAL DATA:  Generalized chest pain yesterday. No pain today. Sudden onset of weakness.  EXAM: CHEST - 2 VIEW  COMPARISON:  None.  FINDINGS: The heart size is normal. Emphysematous changes are evident. Mild interstitial coarsening is chronic. Surgical clips are present in the right axilla and chest wall.  IMPRESSION: 1. Emphysema. 2. No acute cardiopulmonary disease.   Electronically Signed   By: San Morelle M.D.   On: 08/24/2014 14:09   ECG: 08/24/2014 Sinus rhythm, diffuse inferior and lateral ST depression approximately 2 mm  ASSESSMENT AND PLAN:  Principal Problem:   NSTEMI (non-ST elevated myocardial infarction) - Patient is being taken directly to the cath lab with further evaluation and treatment depending on the results. We Misty screen for cardiac risk factors and there are control. She Misty be started on sliding scale insulin and continued on her home medications as appropriate. Active Problems:   Diabetes mellitus type 2, insulin dependent   Misty Speak, PA-C 08/24/2014 5:18 PM Beeper [phone]

## 2014-08-24 NOTE — Consult Note (Signed)
[address]       ,[place] [address]             [id number]        Misty Edwards [facility] Medical Record #[id number] Date of Birth: 03-10-1927  Referring: No ref. provider found Primary Care: Odette Fraction, MD  Chief Complaint:    Chief Complaint  Patient presents with  . Chest Pain    History of Present Illness:     81 yo diabetic with acute MI in cath lab with pulmonary edema and pain  Cath images reviewed with her cardiologist - severe L main and 3 V CAD LVEF 50%  She cannot be treated with PCI per Dr Ellyn Hack She will be prepared for emergency CABG   Current Activity/ Functional Status: Mobility/Ambulation: 50 ft  Blind Zubrod Score: At the time of surgery this patient's most appropriate activity status/level should be described as: '[]'     0    Normal activity, no symptoms '[]'     1    Restricted in physical strenuous activity but ambulatory, able to do out light work '[x]'     2    Ambulatory and capable of self care, unable to do work activities, up and about                 more than 50%  Of the time                            '[]'     3    Only limited self care, in bed greater than 50% of waking hours '[]'     4    Completely disabled, no self care, confined to bed or chair '[]'     5    Moribund  Past Medical History  Diagnosis Date  . Hypertension   . History of carcinoma in situ of breast 1988  . Acute biliary pancreatitis 07/2002    thia was in 07/2004:she still has pseudocyst in tail of pancreas measuring 54 x 33 mm   . Upper GI bleed September2004    secondary to gastritis  . Diabetes mellitus   . Adrenal adenoma     bilateral  . Detached retina   . Chronic pancreatitis   . Hyperlipidemia   . Osteopenia   . IBS (irritable bowel syndrome)   . Legally blind   . Anxiety   . Osteoarthritis   . Diverticulosis     Past Surgical History  Procedure Laterality Date  . Colonoscopy  08/15/05    few tiny diverticula at sigmoid  colon/external hemorrhoids but no polyps  . Right mastectomy    . Ectopic pregnancy surgery  1950's  . Tacking up of her bladder    . Laparoscopic cholecystectomy    . Ercp with sphincterotomy  07/2002  . Pancreatic pseudocyst drainage  June2004    drained percutaneously   . Esophagogastroduodenoscopy       Gastritis of body.  Otherwise normal  . Colonoscopy  01/08/2012    [id number] diverticulosis. Next colonoscopy in 12/2016  . Esophagogastroduodenoscopy  01/08/2012    RMR: Few scattered gastric erosions of uncertain significance-status post biopsy. Minimal chronic inflammation, no H.pylori    History  Smoking status  . Never Smoker   Smokeless tobacco  . Not on file    History  Alcohol Use No    History   Social History  . Marital Status: Married  Spouse Name: N/A  . Number of Children: 5  . Years of Education: N/A   Occupational History  . homemaker    Social History Main Topics  . Smoking status: Never Smoker   . Smokeless tobacco: Not on file  . Alcohol Use: No  . Drug Use: No  . Sexual Activity: No   Other Topics Concern  . Not on file   Social History Narrative   Lives in [place].    Allergies  Allergen Reactions  . Calcium-Containing Compounds Nausea Only  . Raloxifene Itching    Face and eyes burning  . Vitamin D Analogs Nausea Only    Current Facility-Administered Medications  Medication Dose Route Frequency Provider Last Rate Last Dose  . 0.9 %  sodium chloride infusion   Intravenous Once Leonie Man, MD      . aminocaproic acid (AMICAR) 10 g in sodium chloride 0.9 % 100 mL infusion   Intravenous To OR Ivin Poot, MD      . atenolol (TENORMIN) tablet 25 mg  25 mg Oral Daily Rhonda G Barrett, PA-C      . calcium carbonate (TUMS EX) chewable tablet 750 mg  1 tablet Oral Daily Rhonda G Barrett, PA-C      . cefUROXime (ZINACEF) 1.5 g in dextrose 5 % 50 mL IVPB  1.5 g Intravenous To OR Ivin Poot, MD      . cefUROXime  (ZINACEF) 750 mg in dextrose 5 % 50 mL IVPB  750 mg Intravenous To OR Ivin Poot, MD      . cycloSPORINE (RESTASIS) 0.05 % ophthalmic emulsion 1 drop  1 drop Both Eyes TID Rhonda G Barrett, PA-C      . dexmedetomidine (PRECEDEX) 400 MCG/100ML (4 mcg/mL) infusion  0.1-0.7 mcg/kg/hr Intravenous To OR Ivin Poot, MD      . DOPamine (INTROPIN) 800 mg in dextrose 5 % 250 mL (3.2 mg/mL) infusion  0-10 mcg/kg/min Intravenous To OR Ivin Poot, MD      . enalapril (VASOTEC) tablet 20 mg  20 mg Oral Daily Rhonda G Barrett, PA-C      . EPINEPHrine (ADRENALIN) 4 mg in dextrose 5 % 250 mL (0.016 mg/mL) infusion  0-10 mcg/min Intravenous To OR Ivin Poot, MD      . heparin 2,500 Units, papaverine 30 mg in electrolyte-148 (PLASMALYTE-148) 500 mL irrigation   Irrigation To OR Ivin Poot, MD      . heparin 30,000 units/NS 1000 mL solution for CELLSAVER   Other To OR Ivin Poot, MD      . heparin ADULT infusion 100 units/mL (25000 units/250 mL)  650 Units/hr Intravenous Continuous Jola Schmidt, MD 6.5 mL/hr at 08/24/14 1428 650 Units/hr at 08/24/14 1428  . hyoscyamine (LEVSIN SL) SL tablet 0.125 mg  0.125 mg Sublingual Q6H PRN Evelene Croon Barrett, PA-C      . [START ON 08/25/2014] insulin NPH Human (HUMULIN N,NOVOLIN N) injection 30 Units  30 Units Subcutaneous QAC breakfast Rhonda G Barrett, PA-C      . insulin regular (NOVOLIN R,HUMULIN R) 250 Units in sodium chloride 0.9 % 250 mL (1 Units/mL) infusion   Intravenous To OR Ivin Poot, MD      . lipase/protease/amylase (CREON) capsule 48,000 Units  48,000 Units Oral TID AC Rhonda G Barrett, PA-C      . loratadine (CLARITIN) tablet 10 mg  10 mg Oral Daily Rhonda G Barrett, PA-C      .  LORazepam (ATIVAN) tablet 0.5 mg  0.5 mg Oral Q6H PRN Rhonda G Barrett, PA-C      . magnesium sulfate (IV Push/IM) injection 40 mEq  40 mEq Other To OR Ivin Poot, MD      . multivitamin capsule 1 capsule  1 capsule Oral Daily Rhonda G Barrett, PA-C       . nitroGLYCERIN 50 mg in dextrose 5 % 250 mL (0.2 mg/mL) infusion  2-200 mcg/min Intravenous To OR Ivin Poot, MD      . Derrill Memo ON 08/25/2014] pantoprazole (PROTONIX) EC tablet 80 mg  80 mg Oral Q1200 Rhonda G Barrett, PA-C      . phenylephrine (NEO-SYNEPHRINE) 20 mg in dextrose 5 % 250 mL (0.08 mg/mL) infusion  30-200 mcg/min Intravenous To OR Ivin Poot, MD      . potassium chloride injection 80 mEq  80 mEq Other To OR Ivin Poot, MD      . rosuvastatin (CRESTOR) tablet 40 mg  40 mg Oral q1800 Rhonda G Barrett, PA-C      . vancomycin (VANCOCIN) 1,250 mg in sodium chloride 0.9 % 250 mL IVPB  1,250 mg Intravenous To OR Ivin Poot, MD      . zolpidem (AMBIEN) tablet 5 mg  5 mg Oral QHS PRN Lonn Georgia, PA-C        Prescriptions prior to admission  Medication Sig Dispense Refill Last Dose  . acetaminophen (TYLENOL) 500 MG tablet Take 500-1,000 mg by mouth 2 (two) times daily as needed. Pain.   Taking  . atenolol (TENORMIN) 25 MG tablet Take 1 tablet (25 mg total) by mouth daily. 90 tablet 4 Taking  . B-D INS SYRINGE 0.5CC/31GX5/16 31G X 5/16" 0.5 ML MISC USE AS DIRECTED 500 each 5 Taking  . Blood Glucose Monitoring Suppl (BLOOD GLUCOSE METER) kit Use as instructed 1 each 0 Taking  . calcium carbonate (TUMS EX) 750 MG chewable tablet Chew 1 tablet by mouth daily.   Taking  . Carbamide Peroxide (EAR DROPS OT) Place 2 drops in ear(s) daily as needed. Ear pain.   Taking  . cetirizine (ZYRTEC) 10 MG tablet TAKE ONE TABLET BY MOUTH ONCE DAILY 90 tablet 3 Taking  . CREON 24000 UNITS CPEP 2 w/ meals, 1 w/ snacks 720 capsule 3 Taking  . cycloSPORINE (RESTASIS) 0.05 % ophthalmic emulsion Place 1 drop into both eyes 3 (three) times daily.    Taking  . enalapril (VASOTEC) 20 MG tablet Take 1 tablet (20 mg total) by mouth daily. 90 tablet 3 Taking  . esomeprazole (NEXIUM) 40 MG capsule Take 1 capsule (40 mg total) by mouth daily before breakfast. 30 capsule 11 Taking  . esomeprazole  (NEXIUM) 40 MG packet Take 40 mg by mouth daily before breakfast. 90 each 3 Taking  . glucose blood (ONE TOUCH ULTRA TEST) test strip Use to monitor FSBS 5x daily for fluctuating CBG. DX: E11.65 450 each 3 Taking  . hydrochlorothiazide (HYDRODIURIL) 25 MG tablet TAKE ONE TABLET BY MOUTH ONCE DAILY 90 tablet 1 Taking  . hyoscyamine (LEVSIN SL) 0.125 MG SL tablet Place 1 tablet (0.125 mg total) under the tongue every 6 (six) hours as needed for cramping or diarrhea or loose stools. 360 tablet 1 Taking  . INS SYRINGE/NEEDLE 1CC/28G (MONOJECT ULTRA COMFORT SYRINGE) 28G X 1/2" 1 ML MISC Inject insulin BID 200 each 4 Taking  . LORazepam (ATIVAN) 0.5 MG tablet TAKE ONE TABLET BY MOUTH EVERY 8 HOURS AS NEEDED FOR  ANXIETY OR AGITATION 30 tablet 0 Taking  . lovastatin (MEVACOR) 20 MG tablet Take 1 tablet (20 mg total) by mouth at bedtime. 30 tablet 5 Taking  . metFORMIN (GLUCOPHAGE-XR) 500 MG 24 hr tablet TAKE ONE TABLET BY MOUTH ONCE DAILY WITH BREAKFAST 90 tablet 3 Taking  . Multiple Vitamin (MULTIVITAMIN) capsule Take 1 capsule by mouth daily.   Taking  . NOVOLIN N RELION 100 UNIT/ML injection INJECT 42 UNITS SUBCUTANEOUSLY IN THE MORNING AND 4 UNITS IN THE EVENING 160 mL 3 Taking  . promethazine (PHENERGAN) 12.5 MG tablet Take 1 tablet (12.5 mg total) by mouth every 8 (eight) hours as needed for nausea or vomiting. 10 tablet 0 Taking  . zolpidem (AMBIEN) 10 MG tablet TAKE ONE TABLET BY MOUTH AT BEDTIME AS NEEDED FOR  INSOMNIA 30 tablet 0 Taking    Family History  Problem Relation Age of Onset  . Colon cancer Father 11    passed away in his 6's  . Colon cancer Brother 56    surgery inhis 50's doing well now  . Heart attack Brother     passed away age 39  . Pancreatitis Brother   . Ovarian cancer Mother     passed  . Kidney disease Daughter   . Leukemia Son      Review of Systems:       Cardiac Review of Systems: Y or N  Chest Pain [ y  ]  Resting SOB Blue.Reese   ] Exertional SOB  [ y ]    5 [ y ]   Pedal Edema [ n  ]    Palpitations Blue.Reese  ] Syncope  [n  ]   Presyncope [ n  ]  General Review of Systems: [Y] = yes [  ]=no Constitional: recent weight change [  ]; anorexia [  ]; fatigue [  ]; nausea [  ]; night sweats [  ]; fever [  ]; or chills [  ]                                                               Dental: poor dentition[  ]; Last Dentist visit: dentures  Eye : blurred vision [  ]; diplopia [   ]; vision changes [  ];  Amaurosis fugax[  ]; Resp: cough [  ];  wheezing[  ];  hemoptysis[  ]; shortness of breath[ y ]; paroxysmal nocturnal dyspnea[y  ]; dyspnea on exertion[y  ]; or orthopnea[ y ];  GI:  gallstones[  ], vomiting[  ];  dysphagia[  ]; melena[  ];  hematochezia [  ]; heartburn[  ];   Hx of  Colonoscopy[  ]; GU: kidney stones [  ]; hematuria[  ];   dysuria [  ];  nocturia[  ];  history of     obstruction [  ]; urinary frequency [  ]             Skin: rash, swelling[  ];, hair loss[  ];  peripheral edema[  ];  or itching[  ]; Musculosketetal: myalgias[  ];  joint swelling[  ];  joint erythema[  ];  joint pain[  ];  back pain[  ];  Heme/Lymph: bruising[  ];  bleeding[  ];  anemia[  ];  Neuro: TIA[  ];  headaches[  ];  stroke[  ];  vertigo[  ];  seizures[  ];   paresthesias[  ];  difficulty walking[  ];  Psych:depression[  ]; anxiety[  ];  Endocrine: diabetes[  ];  thyroid dysfunction[  ];breastcancer s/p R mastectomy- no rad rx  Immunizations: Flu [  ]; Pneumococcal[  ];  Other:  Physical Exam: BP 158/78 mmHg  Pulse 78  Temp(Src) 98.4 F (36.9 C) (Oral)  Resp 28  Ht '5\' 3"'  (1.6 m)  Wt 140 lb (63.504 kg)  BMI 24.81 kg/m2  SpO2 92%      Physical Exam  General: elderly WF on cath lab table- appears strong and alert HEENT: Normocephalic pupils equal , dentition adequate Neck: Supple without JVD, adenopathy, or bruit Chest: Clear to auscultation, symmetrical breath sounds, no rhonchi, no tenderness             or deformity- prior right  masrtectomy Cardiovascular: Regular rate and rhythm, no murmur, no gallop, peripheral pulses             palpable in all extremities Abdomen:  Soft, nontender, no palpable mass or organomegaly Extremities: Warm, well-perfused, no clubbing cyanosis edema or tenderness,              no venous stasis changes of the legs Rectal/GU: Deferred Neuro: Grossly non--focal and symmetrical throughout Skin: Clean and dry without rash or ulceration     Diagnostic Studies & Laboratory data:     Recent Radiology Findings:   Dg Chest 2 View  08/24/2014   CLINICAL DATA:  Generalized chest pain yesterday. No pain today. Sudden onset of weakness.  EXAM: CHEST - 2 VIEW  COMPARISON:  None.  FINDINGS: The heart size is normal. Emphysematous changes are evident. Mild interstitial coarsening is chronic. Surgical clips are present in the right axilla and chest wall.  IMPRESSION: 1. Emphysema. 2. No acute cardiopulmonary disease.   Electronically Signed   By: San Morelle M.D.   On: 08/24/2014 14:09     I have independently reviewed the above radiologic studies.  Recent Lab Findings: Lab Results  Component Value Date   WBC 7.1 08/24/2014   HGB 11.1* 08/24/2014   HCT 33.9* 08/24/2014   PLT 141* 08/24/2014   GLUCOSE 213* 08/24/2014   CHOL 126 01/13/2014   TRIG 112 01/13/2014   HDL 56 01/13/2014   LDLCALC 48 01/13/2014   ALT 37 08/24/2014   AST 62* 08/24/2014   NA 126* 08/24/2014   K 4.5 08/24/2014   CL 90* 08/24/2014   CREATININE 0.97 08/24/2014   BUN 22* 08/24/2014   CO2 25 08/24/2014   TSH 0.753 08/21/2011   INR 1.05 08/24/2014   HGBA1C 6.4* 01/13/2014      Assessment / Plan:      Plan emergency CABG this pm I have discussed the benefits and risks of emergency CABG with the patient and her daughter [name] and both agree to proceed with emergency high risk CABG as best therapy to prolong life and provide best  long term quality of life      '@ME1' @ 08/24/2014 6:14 PM

## 2014-08-25 ENCOUNTER — Other Ambulatory Visit: Payer: Self-pay

## 2014-08-25 ENCOUNTER — Encounter (HOSPITAL_COMMUNITY): Payer: Self-pay | Admitting: Cardiology

## 2014-08-25 ENCOUNTER — Emergency Department (HOSPITAL_COMMUNITY): Payer: Medicare HMO

## 2014-08-25 ENCOUNTER — Inpatient Hospital Stay (HOSPITAL_COMMUNITY): Payer: Medicare HMO

## 2014-08-25 DIAGNOSIS — Z794 Long term (current) use of insulin: Secondary | ICD-10-CM | POA: Diagnosis not present

## 2014-08-25 DIAGNOSIS — H548 Legal blindness, as defined in USA: Secondary | ICD-10-CM | POA: Diagnosis present

## 2014-08-25 DIAGNOSIS — F419 Anxiety disorder, unspecified: Secondary | ICD-10-CM | POA: Diagnosis present

## 2014-08-25 DIAGNOSIS — R079 Chest pain, unspecified: Secondary | ICD-10-CM | POA: Diagnosis present

## 2014-08-25 DIAGNOSIS — K589 Irritable bowel syndrome without diarrhea: Secondary | ICD-10-CM | POA: Diagnosis present

## 2014-08-25 DIAGNOSIS — K219 Gastro-esophageal reflux disease without esophagitis: Secondary | ICD-10-CM | POA: Diagnosis present

## 2014-08-25 DIAGNOSIS — R5381 Other malaise: Secondary | ICD-10-CM | POA: Diagnosis not present

## 2014-08-25 DIAGNOSIS — Z86000 Personal history of in-situ neoplasm of breast: Secondary | ICD-10-CM | POA: Diagnosis not present

## 2014-08-25 DIAGNOSIS — R32 Unspecified urinary incontinence: Secondary | ICD-10-CM | POA: Diagnosis not present

## 2014-08-25 DIAGNOSIS — E876 Hypokalemia: Secondary | ICD-10-CM | POA: Diagnosis not present

## 2014-08-25 DIAGNOSIS — Z951 Presence of aortocoronary bypass graft: Secondary | ICD-10-CM

## 2014-08-25 DIAGNOSIS — D6959 Other secondary thrombocytopenia: Secondary | ICD-10-CM | POA: Diagnosis not present

## 2014-08-25 DIAGNOSIS — E11649 Type 2 diabetes mellitus with hypoglycemia without coma: Secondary | ICD-10-CM | POA: Diagnosis not present

## 2014-08-25 DIAGNOSIS — Z806 Family history of leukemia: Secondary | ICD-10-CM | POA: Diagnosis not present

## 2014-08-25 DIAGNOSIS — I5021 Acute systolic (congestive) heart failure: Secondary | ICD-10-CM | POA: Diagnosis present

## 2014-08-25 DIAGNOSIS — I10 Essential (primary) hypertension: Secondary | ICD-10-CM | POA: Diagnosis present

## 2014-08-25 DIAGNOSIS — I340 Nonrheumatic mitral (valve) insufficiency: Secondary | ICD-10-CM | POA: Diagnosis present

## 2014-08-25 DIAGNOSIS — K863 Pseudocyst of pancreas: Secondary | ICD-10-CM | POA: Diagnosis present

## 2014-08-25 DIAGNOSIS — Z79899 Other long term (current) drug therapy: Secondary | ICD-10-CM | POA: Diagnosis not present

## 2014-08-25 DIAGNOSIS — K861 Other chronic pancreatitis: Secondary | ICD-10-CM | POA: Diagnosis present

## 2014-08-25 DIAGNOSIS — I214 Non-ST elevation (NSTEMI) myocardial infarction: Secondary | ICD-10-CM | POA: Diagnosis present

## 2014-08-25 DIAGNOSIS — Z9011 Acquired absence of right breast and nipple: Secondary | ICD-10-CM | POA: Diagnosis present

## 2014-08-25 DIAGNOSIS — I2510 Atherosclerotic heart disease of native coronary artery without angina pectoris: Secondary | ICD-10-CM | POA: Diagnosis present

## 2014-08-25 DIAGNOSIS — I249 Acute ischemic heart disease, unspecified: Secondary | ICD-10-CM | POA: Diagnosis not present

## 2014-08-25 DIAGNOSIS — E785 Hyperlipidemia, unspecified: Secondary | ICD-10-CM | POA: Diagnosis present

## 2014-08-25 DIAGNOSIS — E1142 Type 2 diabetes mellitus with diabetic polyneuropathy: Secondary | ICD-10-CM | POA: Diagnosis present

## 2014-08-25 DIAGNOSIS — Z8249 Family history of ischemic heart disease and other diseases of the circulatory system: Secondary | ICD-10-CM | POA: Diagnosis not present

## 2014-08-25 DIAGNOSIS — E119 Type 2 diabetes mellitus without complications: Secondary | ICD-10-CM | POA: Diagnosis not present

## 2014-08-25 DIAGNOSIS — D62 Acute posthemorrhagic anemia: Secondary | ICD-10-CM | POA: Diagnosis not present

## 2014-08-25 DIAGNOSIS — Z7982 Long term (current) use of aspirin: Secondary | ICD-10-CM | POA: Diagnosis not present

## 2014-08-25 LAB — POCT I-STAT 3, ART BLOOD GAS (G3+)
Acid-base deficit: 4 mmol/L — ABNORMAL HIGH (ref 0.0–2.0)
Acid-base deficit: 3 mmol/L — ABNORMAL HIGH (ref 0.0–2.0)
Acid-base deficit: 4 mmol/L — ABNORMAL HIGH (ref 0.0–2.0)
Acid-base deficit: 4 mmol/L — ABNORMAL HIGH (ref 0.0–2.0)
Acid-base deficit: 3 mmol/L — ABNORMAL HIGH (ref 0.0–2.0)
Bicarbonate: 21.7 mEq/L (ref 20.0–24.0)
Bicarbonate: 23.6 mEq/L (ref 20.0–24.0)
Bicarbonate: 19.5 mEq/L — ABNORMAL LOW (ref 20.0–24.0)
Bicarbonate: 21.1 mEq/L (ref 20.0–24.0)
Bicarbonate: 22.4 mEq/L (ref 20.0–24.0)
O2 Saturation: 93 %
O2 Saturation: 100 %
O2 Saturation: 99 %
O2 Saturation: 99 %
O2 Saturation: 98 %
Patient temperature: 35.5
Patient temperature: 36.5
Patient temperature: 36.8
Patient temperature: 36.9
Patient temperature: 37.5
TCO2: 23 mmol/L (ref 0–100)
TCO2: 25 mmol/L (ref 0–100)
TCO2: 20 mmol/L (ref 0–100)
TCO2: 22 mmol/L (ref 0–100)
TCO2: 24 mmol/L (ref 0–100)
pCO2 arterial: 36.5 mmHg (ref 35.0–45.0)
pCO2 arterial: 47.4 mmHg — ABNORMAL HIGH (ref 35.0–45.0)
pCO2 arterial: 29.9 mmHg — ABNORMAL LOW (ref 35.0–45.0)
pCO2 arterial: 38.6 mmHg (ref 35.0–45.0)
pCO2 arterial: 40.4 mmHg (ref 35.0–45.0)
pH, Arterial: 7.375 (ref 7.350–7.450)
pH, Arterial: 7.303 — ABNORMAL LOW (ref 7.350–7.450)
pH, Arterial: 7.346 — ABNORMAL LOW (ref 7.350–7.450)
pH, Arterial: 7.421 (ref 7.350–7.450)
pH, Arterial: 7.353 (ref 7.350–7.450)
pO2, Arterial: 65 mmHg — ABNORMAL LOW (ref 80.0–100.0)
pO2, Arterial: 208 mmHg — ABNORMAL HIGH (ref 80.0–100.0)
pO2, Arterial: 128 mmHg — ABNORMAL HIGH (ref 80.0–100.0)
pO2, Arterial: 142 mmHg — ABNORMAL HIGH (ref 80.0–100.0)
pO2, Arterial: 116 mmHg — ABNORMAL HIGH (ref 80.0–100.0)

## 2014-08-25 LAB — GLUCOSE, CAPILLARY
Glucose-Capillary: 157 mg/dL — ABNORMAL HIGH (ref 65–99)
Glucose-Capillary: 131 mg/dL — ABNORMAL HIGH (ref 65–99)
Glucose-Capillary: 127 mg/dL — ABNORMAL HIGH (ref 65–99)
Glucose-Capillary: 139 mg/dL — ABNORMAL HIGH (ref 65–99)
Glucose-Capillary: 127 mg/dL — ABNORMAL HIGH (ref 65–99)
Glucose-Capillary: 131 mg/dL — ABNORMAL HIGH (ref 65–99)
Glucose-Capillary: 140 mg/dL — ABNORMAL HIGH (ref 65–99)
Glucose-Capillary: 135 mg/dL — ABNORMAL HIGH (ref 65–99)
Glucose-Capillary: 164 mg/dL — ABNORMAL HIGH (ref 65–99)
Glucose-Capillary: 149 mg/dL — ABNORMAL HIGH (ref 65–99)
Glucose-Capillary: 154 mg/dL — ABNORMAL HIGH (ref 65–99)
Glucose-Capillary: 134 mg/dL — ABNORMAL HIGH (ref 65–99)
Glucose-Capillary: 131 mg/dL — ABNORMAL HIGH (ref 65–99)
Glucose-Capillary: 138 mg/dL — ABNORMAL HIGH (ref 65–99)
Glucose-Capillary: 119 mg/dL — ABNORMAL HIGH (ref 65–99)
Glucose-Capillary: 103 mg/dL — ABNORMAL HIGH (ref 65–99)
Glucose-Capillary: 103 mg/dL — ABNORMAL HIGH (ref 65–99)

## 2014-08-25 LAB — CBC
HCT: 30.2 % — ABNORMAL LOW (ref 36.0–46.0)
HCT: 33 % — ABNORMAL LOW (ref 36.0–46.0)
HCT: 30.1 % — ABNORMAL LOW (ref 36.0–46.0)
Hemoglobin: 10.4 g/dL — ABNORMAL LOW (ref 12.0–15.0)
Hemoglobin: 11.3 g/dL — ABNORMAL LOW (ref 12.0–15.0)
Hemoglobin: 10.2 g/dL — ABNORMAL LOW (ref 12.0–15.0)
MCH: 26.8 pg (ref 26.0–34.0)
MCH: 27.3 pg (ref 26.0–34.0)
MCH: 27.5 pg (ref 26.0–34.0)
MCHC: 34.4 g/dL (ref 30.0–36.0)
MCHC: 34.2 g/dL (ref 30.0–36.0)
MCHC: 33.9 g/dL (ref 30.0–36.0)
MCV: 79.2 fL (ref 78.0–100.0)
MCV: 79.7 fL (ref 78.0–100.0)
MCV: 79.9 fL (ref 78.0–100.0)
Platelets: 135 10*3/uL — ABNORMAL LOW (ref 150–400)
Platelets: 153 10*3/uL (ref 150–400)
Platelets: 89 10*3/uL — ABNORMAL LOW (ref 150–400)
RBC: 3.78 MIL/uL — ABNORMAL LOW (ref 3.87–5.11)
RBC: 4.14 MIL/uL (ref 3.87–5.11)
RBC: 3.8 MIL/uL — ABNORMAL LOW (ref 3.87–5.11)
RDW: 14.4 % (ref 11.5–15.5)
RDW: 14.4 % (ref 11.5–15.5)
RDW: 14.7 % (ref 11.5–15.5)
WBC: 10.7 10*3/uL — ABNORMAL HIGH (ref 4.0–10.5)
WBC: 13.1 10*3/uL — ABNORMAL HIGH (ref 4.0–10.5)
WBC: 8.2 10*3/uL (ref 4.0–10.5)

## 2014-08-25 LAB — BASIC METABOLIC PANEL
Anion gap: 10 (ref 5–15)
BUN: 14 mg/dL (ref 6–20)
CHLORIDE: 98 mmol/L — AB (ref 101–111)
CO2: 23 mmol/L (ref 22–32)
CREATININE: 0.97 mg/dL (ref 0.44–1.00)
Calcium: 7.8 mg/dL — ABNORMAL LOW (ref 8.9–10.3)
GFR calc Af Amer: >60 mL/min (ref >60)
GFR calc non Af Amer: 52 mL/min — ABNORMAL LOW (ref >60)
GLUCOSE: 129 mg/dL — AB (ref 65–99)
POTASSIUM: 3.3 mmol/L — AB (ref 3.5–5.1)
SODIUM: 131 mmol/L — AB (ref 135–145)

## 2014-08-25 LAB — PREPARE FRESH FROZEN PLASMA: Unit division: 0

## 2014-08-25 LAB — CREATININE, SERUM
CREATININE: 0.85 mg/dL (ref 0.44–1.00)
GFR calc Af Amer: >60 mL/min (ref >60)
GFR calc non Af Amer: >60 mL/min (ref >60)

## 2014-08-25 LAB — POCT I-STAT, CHEM 8
BUN: 11 mg/dL (ref 6–20)
Calcium, Ion: 1.19 mmol/L (ref 1.13–1.30)
Chloride: 97 mmol/L — ABNORMAL LOW (ref 101–111)
Creatinine, Ser: 0.8 mg/dL (ref 0.44–1.00)
Glucose, Bld: 120 mg/dL — ABNORMAL HIGH (ref 65–99)
HCT: 29 % — ABNORMAL LOW (ref 36.0–46.0)
Hemoglobin: 9.9 g/dL — ABNORMAL LOW (ref 12.0–15.0)
Potassium: 4.1 mmol/L (ref 3.5–5.1)
Sodium: 134 mmol/L — ABNORMAL LOW (ref 135–145)
TCO2: 23 mmol/L (ref 0–100)

## 2014-08-25 LAB — POCT I-STAT 4, (NA,K, GLUC, HGB,HCT)
Glucose, Bld: 186 mg/dL — ABNORMAL HIGH (ref 65–99)
HCT: 32 % — ABNORMAL LOW (ref 36.0–46.0)
Hemoglobin: 10.9 g/dL — ABNORMAL LOW (ref 12.0–15.0)
Potassium: 3.3 mmol/L — ABNORMAL LOW (ref 3.5–5.1)
Sodium: 134 mmol/L — ABNORMAL LOW (ref 135–145)

## 2014-08-25 LAB — PREPARE PLATELET PHERESIS: Unit division: 0

## 2014-08-25 LAB — APTT: aPTT: 31 seconds (ref 24–37)

## 2014-08-25 LAB — MAGNESIUM
MAGNESIUM: 2.6 mg/dL — AB (ref 1.7–2.4)
Magnesium: 2.1 mg/dL (ref 1.7–2.4)

## 2014-08-25 LAB — PROTIME-INR
INR: 1.46 (ref 0.00–1.49)
Prothrombin Time: 17.8 seconds — ABNORMAL HIGH (ref 11.6–15.2)

## 2014-08-25 MED ORDER — LACTATED RINGERS IV SOLN: 500.0000 mL | Freq: Once | INTRAVENOUS | Status: AC | PRN

## 2014-08-25 MED ORDER — SODIUM CHLORIDE 0.9 % IJ SOLN: 3.0000 mL | INTRAMUSCULAR | Status: DC | PRN

## 2014-08-25 MED ORDER — OXYCODONE HCL 5 MG PO TABS: 5.0000 mg | ORAL_TABLET | ORAL | Status: DC | PRN

## 2014-08-25 MED ORDER — SODIUM CHLORIDE 0.9 % IV SOLN: 250.0000 mL | INTRAVENOUS | Status: DC

## 2014-08-25 MED ORDER — METOPROLOL TARTRATE 1 MG/ML IV SOLN
2.5000 mg | INTRAVENOUS | Status: DC | PRN
  Administered 2014-08-26 – 2014-08-28 (×7): 5 mg via INTRAVENOUS
  Filled 2014-08-25 (×5): qty 5

## 2014-08-25 MED ORDER — ACETAMINOPHEN 650 MG RE SUPP
650.0000 mg | Freq: Once | RECTAL | Status: AC
  Administered 2014-08-25: 650 mg via RECTAL

## 2014-08-25 MED ORDER — METOPROLOL TARTRATE 25 MG/10 ML ORAL SUSPENSION
12.5000 mg | Freq: Two times a day (BID) | ORAL | Status: DC
  Filled 2014-08-25 (×8): qty 5

## 2014-08-25 MED ORDER — MORPHINE SULFATE 2 MG/ML IJ SOLN
1.0000 mg | INTRAMUSCULAR | Status: AC | PRN
  Administered 2014-08-25 (×2): 2 mg via INTRAVENOUS
  Filled 2014-08-25 (×2): qty 2

## 2014-08-25 MED ORDER — CHLORHEXIDINE GLUCONATE 0.12 % MT SOLN
15.0000 mL | Freq: Two times a day (BID) | OROMUCOSAL | Status: DC
  Administered 2014-08-25 – 2014-08-26 (×3): 15 mL via OROMUCOSAL
  Filled 2014-08-25 (×3): qty 15

## 2014-08-25 MED ORDER — NITROGLYCERIN IN D5W 200-5 MCG/ML-% IV SOLN
0.0000 ug/min | INTRAVENOUS | Status: DC
  Administered 2014-08-27: 45 ug/min via INTRAVENOUS
  Filled 2014-08-25: qty 250

## 2014-08-25 MED ORDER — METOPROLOL TARTRATE 12.5 MG HALF TABLET
12.5000 mg | ORAL_TABLET | Freq: Two times a day (BID) | ORAL | Status: DC
  Administered 2014-08-25 – 2014-08-27 (×4): 12.5 mg via ORAL
  Filled 2014-08-25 (×8): qty 1

## 2014-08-25 MED ORDER — VANCOMYCIN HCL IN DEXTROSE 1-5 GM/200ML-% IV SOLN
1000.0000 mg | Freq: Once | INTRAVENOUS | Status: DC
  Filled 2014-08-25: qty 200

## 2014-08-25 MED ORDER — SODIUM CHLORIDE 0.9 % IJ SOLN
3.0000 mL | Freq: Two times a day (BID) | INTRAMUSCULAR | Status: DC
  Administered 2014-08-25 – 2014-08-30 (×11): 3 mL via INTRAVENOUS

## 2014-08-25 MED ORDER — POTASSIUM CHLORIDE 10 MEQ/50ML IV SOLN
10.0000 meq | INTRAVENOUS | Status: AC
  Administered 2014-08-25 (×3): 10 meq via INTRAVENOUS

## 2014-08-25 MED ORDER — HEPARIN SODIUM (PORCINE) 1000 UNIT/ML IJ SOLN
INTRAMUSCULAR | Status: AC
  Filled 2014-08-25: qty 3

## 2014-08-25 MED ORDER — TRAMADOL HCL 50 MG PO TABS
50.0000 mg | ORAL_TABLET | ORAL | Status: DC | PRN
  Administered 2014-08-26: 100 mg via ORAL
  Filled 2014-08-25: qty 2

## 2014-08-25 MED ORDER — EPINEPHRINE HCL 1 MG/ML IJ SOLN
0.0000 ug/min | INTRAVENOUS | Status: DC
  Filled 2014-08-25 (×2): qty 4

## 2014-08-25 MED ORDER — MAGNESIUM SULFATE 4 GM/100ML IV SOLN
4.0000 g | Freq: Once | INTRAVENOUS | Status: AC
  Administered 2014-08-25: 4 g via INTRAVENOUS
  Filled 2014-08-25: qty 100

## 2014-08-25 MED ORDER — PHENYLEPHRINE 40 MCG/ML (10ML) SYRINGE FOR IV PUSH (FOR BLOOD PRESSURE SUPPORT)
PREFILLED_SYRINGE | INTRAVENOUS | Status: AC
  Filled 2014-08-25: qty 40

## 2014-08-25 MED ORDER — LACTATED RINGERS IV SOLN: INTRAVENOUS | Status: DC

## 2014-08-25 MED ORDER — ROCURONIUM BROMIDE 50 MG/5ML IV SOLN
INTRAVENOUS | Status: AC
  Filled 2014-08-25: qty 1

## 2014-08-25 MED ORDER — ACETAMINOPHEN 500 MG PO TABS
1000.0000 mg | ORAL_TABLET | Freq: Four times a day (QID) | ORAL | Status: DC
  Administered 2014-08-26 – 2014-08-29 (×12): 1000 mg via ORAL
  Filled 2014-08-25 (×19): qty 2

## 2014-08-25 MED ORDER — INSULIN ASPART 100 UNIT/ML ~~LOC~~ SOLN
0.0000 [IU] | SUBCUTANEOUS | Status: DC
  Administered 2014-08-26 (×2): 2 [IU] via SUBCUTANEOUS
  Administered 2014-08-26 (×3): 4 [IU] via SUBCUTANEOUS
  Administered 2014-08-26: 2 [IU] via SUBCUTANEOUS
  Administered 2014-08-26: 4 [IU] via SUBCUTANEOUS
  Administered 2014-08-27 (×3): 2 [IU] via SUBCUTANEOUS
  Administered 2014-08-27 – 2014-08-28 (×2): 4 [IU] via SUBCUTANEOUS
  Administered 2014-08-28: 2 [IU] via SUBCUTANEOUS
  Administered 2014-08-28: 4 [IU] via SUBCUTANEOUS

## 2014-08-25 MED ORDER — CETYLPYRIDINIUM CHLORIDE 0.05 % MT LIQD
7.0000 mL | Freq: Four times a day (QID) | OROMUCOSAL | Status: DC
  Administered 2014-08-25 – 2014-08-26 (×7): 7 mL via OROMUCOSAL

## 2014-08-25 MED ORDER — SODIUM CHLORIDE 0.9 % IV SOLN
INTRAVENOUS | Status: AC
  Filled 2014-08-25: qty 2.5

## 2014-08-25 MED ORDER — MIDAZOLAM HCL 2 MG/2ML IJ SOLN
2.0000 mg | INTRAMUSCULAR | Status: DC | PRN
  Administered 2014-08-25 (×2): 2 mg via INTRAVENOUS
  Filled 2014-08-25 (×3): qty 2

## 2014-08-25 MED ORDER — VANCOMYCIN HCL IN DEXTROSE 1-5 GM/200ML-% IV SOLN
1000.0000 mg | Freq: Two times a day (BID) | INTRAVENOUS | Status: AC
  Administered 2014-08-25 (×2): 1000 mg via INTRAVENOUS
  Filled 2014-08-25 (×2): qty 200

## 2014-08-25 MED ORDER — INSULIN ASPART 100 UNIT/ML ~~LOC~~ SOLN
3.0000 [IU] | Freq: Three times a day (TID) | SUBCUTANEOUS | Status: DC
  Administered 2014-08-26 – 2014-08-27 (×3): 3 [IU] via SUBCUTANEOUS

## 2014-08-25 MED ORDER — ROCURONIUM BROMIDE 50 MG/5ML IV SOLN
INTRAVENOUS | Status: AC
  Filled 2014-08-25: qty 3

## 2014-08-25 MED ORDER — BISACODYL 10 MG RE SUPP
10.0000 mg | Freq: Every day | RECTAL | Status: DC
  Filled 2014-08-25: qty 1

## 2014-08-25 MED ORDER — ALBUMIN HUMAN 5 % IV SOLN
250.0000 mL | INTRAVENOUS | Status: AC | PRN
  Administered 2014-08-25: 250 mL via INTRAVENOUS

## 2014-08-25 MED ORDER — PHENYLEPHRINE HCL 10 MG/ML IJ SOLN
0.0000 ug/min | INTRAMUSCULAR | Status: DC
  Administered 2014-08-25: 25 ug/min via INTRAVENOUS
  Filled 2014-08-25 (×2): qty 2

## 2014-08-25 MED ORDER — ACETAMINOPHEN 160 MG/5ML PO SOLN: 650.0000 mg | Freq: Once | ORAL | Status: AC

## 2014-08-25 MED ORDER — ASPIRIN EC 325 MG PO TBEC
325.0000 mg | DELAYED_RELEASE_TABLET | Freq: Every day | ORAL | Status: DC
  Administered 2014-08-26: 325 mg via ORAL
  Filled 2014-08-25 (×2): qty 1

## 2014-08-25 MED ORDER — ETOMIDATE 2 MG/ML IV SOLN
INTRAVENOUS | Status: AC
  Filled 2014-08-25: qty 10

## 2014-08-25 MED ORDER — METOCLOPRAMIDE HCL 5 MG/ML IJ SOLN
10.0000 mg | Freq: Four times a day (QID) | INTRAMUSCULAR | Status: AC
  Administered 2014-08-25 – 2014-08-27 (×8): 10 mg via INTRAVENOUS
  Filled 2014-08-25 (×9): qty 2

## 2014-08-25 MED ORDER — ACETAMINOPHEN 160 MG/5ML PO SOLN
1000.0000 mg | Freq: Four times a day (QID) | ORAL | Status: DC
  Administered 2014-08-25 (×2): 1000 mg
  Filled 2014-08-25 (×2): qty 40.6

## 2014-08-25 MED ORDER — SODIUM CHLORIDE 0.9 % IJ SOLN
INTRAMUSCULAR | Status: AC
  Filled 2014-08-25: qty 10

## 2014-08-25 MED ORDER — SODIUM CHLORIDE 0.45 % IV SOLN
INTRAVENOUS | Status: DC | PRN
  Administered 2014-08-25: 01:00:00 via INTRAVENOUS

## 2014-08-25 MED ORDER — DEXTROSE 5 % IV SOLN
1.5000 g | Freq: Two times a day (BID) | INTRAVENOUS | Status: AC
  Administered 2014-08-25 – 2014-08-26 (×4): 1.5 g via INTRAVENOUS
  Filled 2014-08-25 (×4): qty 1.5

## 2014-08-25 MED ORDER — FAMOTIDINE IN NACL 20-0.9 MG/50ML-% IV SOLN
20.0000 mg | Freq: Two times a day (BID) | INTRAVENOUS | Status: AC
  Administered 2014-08-25: 20 mg via INTRAVENOUS
  Filled 2014-08-25: qty 50

## 2014-08-25 MED ORDER — PANTOPRAZOLE SODIUM 40 MG PO TBEC
40.0000 mg | DELAYED_RELEASE_TABLET | Freq: Every day | ORAL | Status: DC
  Administered 2014-08-26 – 2014-08-31 (×6): 40 mg via ORAL
  Filled 2014-08-25 (×6): qty 1

## 2014-08-25 MED ORDER — ASPIRIN 81 MG PO CHEW: 324.0000 mg | CHEWABLE_TABLET | Freq: Every day | ORAL | Status: DC

## 2014-08-25 MED ORDER — INSULIN REGULAR HUMAN 100 UNIT/ML IJ SOLN
INTRAMUSCULAR | Status: DC
  Filled 2014-08-25: qty 2.5

## 2014-08-25 MED ORDER — MIDAZOLAM HCL 2 MG/2ML IJ SOLN: 2.0000 mg | Freq: Four times a day (QID) | INTRAMUSCULAR | Status: DC | PRN

## 2014-08-25 MED ORDER — DEXMEDETOMIDINE HCL IN NACL 200 MCG/50ML IV SOLN
0.0000 ug/kg/h | INTRAVENOUS | Status: DC
  Administered 2014-08-25: 0.7 ug/kg/h via INTRAVENOUS
  Filled 2014-08-25: qty 50

## 2014-08-25 MED ORDER — DOCUSATE SODIUM 100 MG PO CAPS
200.0000 mg | ORAL_CAPSULE | Freq: Every day | ORAL | Status: DC
  Administered 2014-08-26 – 2014-08-31 (×6): 200 mg via ORAL
  Filled 2014-08-25 (×6): qty 2

## 2014-08-25 MED ORDER — MILRINONE IN DEXTROSE 20 MG/100ML IV SOLN
0.1000 ug/kg/min | INTRAVENOUS | Status: DC
  Administered 2014-08-25 – 2014-08-26 (×2): 0.375 ug/kg/min via INTRAVENOUS
  Administered 2014-08-26: 0.3 ug/kg/min via INTRAVENOUS
  Filled 2014-08-25 (×3): qty 100

## 2014-08-25 MED ORDER — MORPHINE SULFATE 2 MG/ML IJ SOLN
2.0000 mg | INTRAMUSCULAR | Status: DC | PRN
  Administered 2014-08-25 – 2014-08-26 (×7): 2 mg via INTRAVENOUS
  Filled 2014-08-25 (×7): qty 1

## 2014-08-25 MED ORDER — PROTAMINE SULFATE 10 MG/ML IV SOLN
INTRAVENOUS | Status: AC
  Filled 2014-08-25: qty 25

## 2014-08-25 MED ORDER — VECURONIUM BROMIDE 10 MG IV SOLR
INTRAVENOUS | Status: AC
  Filled 2014-08-25: qty 10

## 2014-08-25 MED ORDER — LACTATED RINGERS IV SOLN
INTRAVENOUS | Status: DC
  Administered 2014-08-25: 01:00:00 via INTRAVENOUS

## 2014-08-25 MED ORDER — ONDANSETRON HCL 4 MG/2ML IJ SOLN
4.0000 mg | Freq: Four times a day (QID) | INTRAMUSCULAR | Status: DC | PRN
  Administered 2014-08-25 – 2014-08-29 (×7): 4 mg via INTRAVENOUS
  Filled 2014-08-25 (×7): qty 2

## 2014-08-25 MED ORDER — SODIUM CHLORIDE 0.9 % IV SOLN
INTRAVENOUS | Status: DC
  Administered 2014-08-25: 02:00:00 via INTRAVENOUS
  Administered 2014-08-27: 10 mL/h via INTRAVENOUS

## 2014-08-25 MED ORDER — FUROSEMIDE 10 MG/ML IJ SOLN
20.0000 mg | Freq: Once | INTRAMUSCULAR | Status: AC
  Administered 2014-08-25: 20 mg via INTRAVENOUS
  Filled 2014-08-25: qty 2

## 2014-08-25 MED ORDER — INSULIN REGULAR BOLUS VIA INFUSION
0.0000 [IU] | Freq: Three times a day (TID) | INTRAVENOUS | Status: DC
  Filled 2014-08-25: qty 10

## 2014-08-25 MED ORDER — BISACODYL 5 MG PO TBEC
10.0000 mg | DELAYED_RELEASE_TABLET | Freq: Every day | ORAL | Status: DC
  Administered 2014-08-25 – 2014-08-31 (×5): 10 mg via ORAL
  Filled 2014-08-25 (×5): qty 2

## 2014-08-25 MED ORDER — INSULIN DETEMIR 100 UNIT/ML ~~LOC~~ SOLN
18.0000 [IU] | Freq: Two times a day (BID) | SUBCUTANEOUS | Status: DC
  Administered 2014-08-25 – 2014-08-27 (×5): 18 [IU] via SUBCUTANEOUS
  Filled 2014-08-25 (×7): qty 0.18

## 2014-08-25 MED ORDER — SODIUM BICARBONATE 8.4 % IV SOLN
INTRAVENOUS | Status: AC
  Filled 2014-08-25: qty 50

## 2014-08-25 MED ORDER — SODIUM BICARBONATE 8.4 % IV SOLN
50.0000 meq | Freq: Once | INTRAVENOUS | Status: AC
  Administered 2014-08-25: 50 meq via INTRAVENOUS

## 2014-08-25 MED FILL — Electrolyte-R (PH 7.4) Solution: INTRAVENOUS | Qty: 5000 | Status: AC

## 2014-08-25 MED FILL — Heparin Sodium (Porcine) Inj 1000 Unit/ML: INTRAMUSCULAR | Qty: 30 | Status: AC

## 2014-08-25 MED FILL — Heparin Sodium (Porcine) Inj 1000 Unit/ML: INTRAMUSCULAR | Qty: 10 | Status: AC

## 2014-08-25 MED FILL — Heparin Sodium (Porcine) 2 Unit/ML in Sodium Chloride 0.9%: INTRAMUSCULAR | Qty: 1000 | Status: AC

## 2014-08-25 MED FILL — Lidocaine HCl IV Inj 20 MG/ML: INTRAVENOUS | Qty: 5 | Status: AC

## 2014-08-25 MED FILL — Potassium Chloride Inj 2 mEq/ML: INTRAVENOUS | Qty: 40 | Status: AC

## 2014-08-25 MED FILL — Sodium Chloride IV Soln 0.9%: INTRAVENOUS | Qty: 2000 | Status: AC

## 2014-08-25 MED FILL — Magnesium Sulfate Inj 50%: INTRAMUSCULAR | Qty: 10 | Status: AC

## 2014-08-25 MED FILL — Mannitol IV Soln 20%: INTRAVENOUS | Qty: 500 | Status: AC

## 2014-08-25 MED FILL — Sodium Bicarbonate IV Soln 8.4%: INTRAVENOUS | Qty: 50 | Status: AC

## 2014-08-25 NOTE — Procedures (Signed)
Extubation Procedure Note  Patient Details:   Name: Misty Edwards DOB: [birth year] MRN: [id number]   Airway Documentation:     Evaluation  O2 sats: stable throughout Complications: No apparent complications Patient did tolerate procedure well. Bilateral Breath Sounds: Clear   Yes  Patient tolerated rapid wean. NIF -20 and VC 0.8 L. Positive for cuff leak. Patient extubated to a 3 Lpm nasal cannula. No signs of dyspnea or stridor. Patient instructed on the Incentive Spirometer achieving 750 mL, five times. Patient resting comfortably. RN at bedside.    [name] 08/25/2014, 9:37 AM

## 2014-08-25 NOTE — Progress Notes (Signed)
6 french sheath removed from right radial artery and TR band applied.  10 cc of air in band with good hemostasis.  Site looks good with no hematoma.  Instructions given to RN for band removal.  Good SAT waveform from right hand.

## 2014-08-25 NOTE — Progress Notes (Signed)
1 Day Post-Op Procedure(s) (LRB): CORONARY ARTERY BYPASS GRAFTING (CABG) x three, using left internal mammary artery and right leg greater saphenous vein harvested endoscopically (N/A) TRANSESOPHAGEAL ECHOCARDIOGRAM (TEE) (N/A) Subjective: Patient stable after emergency CABGx3 last night for acute MI with pulmonary edema, left main three-vessel CAD The patient is legally blind She is been extubated and will be mobilized today. Maintaining sinus rhythm, weaning off low-dose epinephrine She appears to be neurologically intact-preop carotid Dopplers had not been performed  Continue low-dose milrinone for preoperative moderate mitral regurgitation and moderate LV dysfunction  Objective: Vital signs in last 24 hours: Temp:  [95.9 F (35.5 C)-99.5 F (37.5 C)] 98.6 F (37 C) (06/10 1503) Pulse Rate:  [0-138] 79 (06/10 1530) Cardiac Rhythm:  [-] Normal sinus rhythm (06/10 1500) Resp:  [0-40] 25 (06/10 1530) BP: (88-198)/(35-107) 122/50 mmHg (06/10 1500) SpO2:  [0 %-100 %] 100 % (06/10 1530) Arterial Line BP: (80-142)/(35-54) 120/42 mmHg (06/10 1530) FiO2 (%):  [40 %-60 %] 40 % (06/10 0820) Weight:  [156 lb 15.5 oz (71.2 kg)] 156 lb 15.5 oz (71.2 kg) (06/10 0428)  Hemodynamic parameters for last 24 hours: PAP: (22-35)/(10-23) 29/11 mmHg CO:  [3.7 L/min-5.7 L/min] 5.7 L/min CI:  [2.2 L/min/m2-3.5 L/min/m2] 3.5 L/min/m2  Intake/Output from previous day: 06/09 0701 - 06/10 0700 In: 4388.5 [I.V.:3271.5; Blood:727; NG/GT:90; IV Piggyback:300] Out: 4315 [Urine:2785; Emesis/NG output:100; Blood:850; Chest Tube:200] Intake/Output this shift: Total I/O In: 1427.2 [I.V.:747.2; NG/GT:30; IV Piggyback:650] Out: 935 [Urine:585; Emesis/NG output:200; Chest Tube:150]  Exam  Alert and comfortable Breath sounds clear Lower extremities edematous but warm Abdomen slightly distended but nontender  Lab Results:  Recent Labs  08/25/14 0045 08/25/14 0048 08/25/14 0500  WBC 10.7*  --  13.1*   HGB 10.4* 10.9* 11.3*  HCT 30.2* 32.0* 33.0*  PLT 135*  --  153   BMET:  Recent Labs  08/24/14 1426  08/24/14 2314 08/25/14 0048 08/25/14 0500  NA 126*  < > 132* 134* 131*  K 4.5  < > 3.8 3.3* 3.3*  CL 90*  < > 94*  --  98*  CO2 25  --   --   --  23  GLUCOSE 213*  < > 218* 186* 129*  BUN 22*  < > 14  --  14  CREATININE 0.97  < > 0.70  --  0.97  CALCIUM 9.3  --   --   --  7.8*  < > = values in this interval not displayed.  PT/INR:  Recent Labs  08/25/14 0045  LABPROT 17.8*  INR 1.46   ABG    Component Value Date/Time   PHART 7.353 08/25/2014 1315   HCO3 22.4 08/25/2014 1315   TCO2 24 08/25/2014 1315   ACIDBASEDEF 3.0* 08/25/2014 1315   O2SAT 98.0 08/25/2014 1315   CBG (last 3)   Recent Labs  08/25/14 0407 08/25/14 0455 08/25/14 0559  GLUCAP 127* 139* 127*    Assessment/Plan: S/P Procedure(s) (LRB): CORONARY ARTERY BYPASS GRAFTING (CABG) x three, using left internal mammary artery and right leg greater saphenous vein harvested endoscopically (N/A) TRANSESOPHAGEAL ECHOCARDIOGRAM (TEE) (N/A) Remove chest tubes and Swan, mobilize out of bed Start diuretics Blood sugar control   LOS: 0 days    Tharon Aquas Trigt III 08/25/2014

## 2014-08-25 NOTE — Care Management Note (Signed)
Case Management Note  Patient Details  Name: Misty Edwards MRN: [id number] Date of Birth: 1926/08/25  Subjective/Objective:     Lives at home with husband who has dementia and is his caregiver.  [name] is with him now.  She is concerned about grandson caring for him.  Has 5 children but all are sick with some kind of illness and not able to assist much.  States her [name] is her POA.  Daughter [name] is best person to talk to - number [phone].  Not opposed to going to SNF for ST rehab as she understands will not be able to care for husband.  PT order placed.  SW consult placed to follow also.           Action/Plan:   Expected Discharge Date:                  Expected Discharge Plan:  [place]  In-House Referral:     Discharge planning Services     Post Acute Care Choice:    Choice offered to:     DME Arranged:    DME Agency:     HH Arranged:    [place] Agency:     Status of Service:  In process, will continue to follow  Medicare Important Message Given:    Date Medicare IM Given:    Medicare IM give by:    Date Additional Medicare IM Given:    Additional Medicare Important Message give by:     If discussed at [place] of Stay Meetings, dates discussed:    Additional Comments:  Vergie Living, RN 08/25/2014, 4:11 PM

## 2014-08-25 NOTE — Transfer of Care (Signed)
Immediate Anesthesia Transfer of Care Note  Patient: Misty Edwards  Procedure(s) Performed: Procedure(s): CORONARY ARTERY BYPASS GRAFTING (CABG) x three, using left internal mammary artery and right leg greater saphenous vein harvested endoscopically (N/A) TRANSESOPHAGEAL ECHOCARDIOGRAM (TEE) (N/A)  Patient Location: SICU  Anesthesia Type:General  Level of Consciousness: Patient remains intubated per anesthesia plan  Airway & Oxygen Therapy: Patient remains intubated per anesthesia plan and Patient placed on Ventilator (see vital sign flow sheet for setting)  Post-op Assessment: Report given to RN and Post -op Vital signs reviewed and stable  Post vital signs: Reviewed and stable  Last Vitals:  Filed Vitals:   08/24/14 1813  BP:   Pulse: 0  Temp:   Resp: 0    Complications: No apparent anesthesia complications

## 2014-08-25 NOTE — Progress Notes (Signed)
Patient ID: Misty Edwards, female   DOB: 10/20/1926, 81 y.o.   MRN: [id number] EVENING ROUNDS NOTE :     [address]       [place] [address]             [phone]                 1 Day Post-Op Procedure(s) (LRB): CORONARY ARTERY BYPASS GRAFTING (CABG) x three, using left internal mammary artery and right leg greater saphenous vein harvested endoscopically (N/A) TRANSESOPHAGEAL ECHOCARDIOGRAM (TEE) (N/A)  Total Length of Stay:  LOS: 0 days  BP 119/43 mmHg  Pulse 89  Temp(Src) 98.6 F (37 C) (Oral)  Resp 25  Ht 5\' 3"  (1.6 m)  Wt 156 lb 15.5 oz (71.2 kg)  BMI 27.81 kg/m2  SpO2 98%  .Intake/Output      06/10 0701 - 06/11 0700   I.V. (mL/kg) 891.6 (12.5)   Blood    NG/GT 30   IV Piggyback 650   Total Intake(mL/kg) 1571.6 (22.1)   Urine (mL/kg/hr) 1100 (1.2)   Emesis/NG output 200 (0.2)   Blood    Chest Tube 150 (0.2)   Total Output 1450   Net +121.6         . sodium chloride Stopped (08/25/14 1300)  . sodium chloride    . sodium chloride 20 mL/hr at 08/25/14 0400  . EPINEPHrine 4 mg in dextrose 5% 250 mL infusion (16 mcg/mL) Stopped (08/25/14 1500)  . lactated ringers 20 mL/hr at 08/25/14 1800  . lactated ringers 20 mL/hr at 08/25/14 0100  . milrinone 0.375 mcg/kg/min (08/25/14 1800)  . nitroGLYCERIN Stopped (08/25/14 0058)  . phenylephrine (NEO-SYNEPHRINE) Adult infusion Stopped (08/25/14 1800)     Lab Results  Component Value Date   WBC 8.2 08/25/2014   HGB 10.2* 08/25/2014   HCT 30.1* 08/25/2014   PLT 89* 08/25/2014   GLUCOSE 120* 08/25/2014   CHOL 126 01/13/2014   TRIG 112 01/13/2014   HDL 56 01/13/2014   LDLCALC 48 01/13/2014   ALT 37 08/24/2014   AST 62* 08/24/2014   NA 134* 08/25/2014   K 4.1 08/25/2014   CL 97* 08/25/2014   CREATININE 0.85 08/25/2014   BUN 11 08/25/2014   CO2 23 08/25/2014   TSH 0.753 08/21/2011   INR 1.46 08/25/2014   HGBA1C 6.4* 01/13/2014   MICROALBUR 1.0 01/13/2014   Extubated, awake and  alert Neo off still on low dose milrinone   Grace Isaac MD  Beeper [phone] Office [phone] 08/25/2014 7:35 PM

## 2014-08-25 NOTE — Anesthesia Postprocedure Evaluation (Signed)
  Anesthesia Post-op Note  Patient: Misty Edwards  Procedure(s) Performed: Procedure(s) (LRB): CORONARY ARTERY BYPASS GRAFTING (CABG) x three, using left internal mammary artery and right leg greater saphenous vein harvested endoscopically (N/A) TRANSESOPHAGEAL ECHOCARDIOGRAM (TEE) (N/A)  Patient Location: PACU  Anesthesia Type: General  Level of Consciousness: sedated   Airway and Oxygen Therapy: intubated  Post-op Pain: mild  Post-op Assessment: Post-op Vital signs reviewed, Patient's Cardiovascular Status Stable, Respiratory Function Stable, Patent Airway and No signs of Nausea or vomiting  Last Vitals:  Filed Vitals:   08/25/14 0645  BP:   Pulse: 86  Temp: 36.7 C  Resp: 14    Post-op Vital Signs: stable   Complications: No apparent anesthesia complications

## 2014-08-25 NOTE — Op Note (Signed)
NAMECAROLL, WEINHEIMER NO.:  [id number]  MEDICAL RECORD NO.:  [id number]  LOCATION:  2S15C                        FACILITY:  [facility]  PHYSICIAN:  Ivin Poot, M.D.  DATE OF BIRTH:  Apr 19, 1926  DATE OF PROCEDURE:  08/24/2014 DATE OF DISCHARGE:                              OPERATIVE REPORT   OPERATIONS: 1. Emergency coronary artery bypass grafting x3 (left internal mammary     artery to LAD, saphenous vein graft to obtuse marginal, saphenous     vein graft to posterior descending). 2. Endoscopic harvest of right leg greater saphenous vein.  PREOPERATIVE DIAGNOSES:  Acute myocardial infarction, severe left main and three-vessel coronary artery disease, acute systolic heart failure.  POSTOPERATIVE DIAGNOSES:  Acute myocardial infarction, severe left main and three-vessel coronary artery disease, acute systolic heart failure.  SURGEON:  Ivin Poot, M.D.  ASSISTANT:  John Giovanni, PA-C.  ANESTHESIA:  General by Dr. Myrtie Soman.  INDICATIONS:  The patient is an 81 year old diabetic blind female who presented with 3 days of chest pain and progressive shortness of breath, weakness, and orthopnea.  She presented to an emergency department, where she was noted to have ST-segment elevations and positive cardiac enzymes.  She was transferred to this hospital for cardiology consultation, evaluation, and underwent urgent cardiac catheterization by Dr. Ellyn Hack.  This demonstrated a 95% left main stenosis with chronic occlusion of the LAD, tight circumflex stenosis, and a 95% stenosis of the right coronary artery.  She had inferior hypokinesia and was on 100% oxygen non-rebreather mask.  I examined the patient in the cardiac catheterization laboratory, reviewed the coronary arteriograms with her cardiologist, Dr. Ellyn Hack and discussed the best treatment option for this elderly acutely ill female with her cardiologist.  I agreed with her cardiologist that an  emergency coronary artery bypass grafting would be the best option as percutaneous intervention was not felt to be with realistic chance of success.  I then discussed the situation with the patient's daughter in the cath lab waiting room and discussed with the patient as well as the daughter the indications and expected benefits of coronary artery bypass grafting, the alternatives, the expected recovery, and the potential risks including the risks of death, stroke, bleeding, blood transfusion requirement, postoperative pulmonary problems including pleural effusions, infection, and death.  Both the patient and her daughter demonstrated understanding and agreed to proceed with surgery under what I felt was an informed consent.  OPERATIVE FINDINGS: 1. Adequate conduit from endoscopically harvested vein and left IMA     vessel. 2. Significant emphysema was noted. 3. The right coronary and circumflex were heavily diseased but     adequate targets, the LAD was chronically occluded, somewhat     atretic in a suboptimal target. 4. Improvement in global LV function after separation from     cardiopulmonary bypass following surgical coronary     revascularization.  OPERATIVE PROCEDURE:  The patient was brought to the operating room and placed supine on the operating table.  General anesthesia was induced. Invasive hemodynamic monitoring lines were placed by the Anesthesia team.  A transesophageal echo probe was placed by the Anesthesia team. The patient remained hemodynamically stable.  The patient was then prepped and draped as a sterile field.  A proper time-out was performed. A sternal incision was made as the right leg greater saphenous vein was harvested using endoscopic approach.  After the mammary artery was harvested, the sternum was retracted, and the pericardium was opened and suspended.  Pursestrings were placed in the ascending aorta and right atrium.  Heparin was administered,  and the ACT was documented as being therapeutic.  The patient was then cannulated, placed on cardiopulmonary bypass, and coronaries were identified for grafting.  It should be noted the patient was anemic on presentation to the operating room and received 1 unit of packed cells in the prime of the cardiopulmonary bypass circuit.  The mammary artery and vein grafts were prepared for the distal anastomoses, and cardioplegia cannulas were placed for both antegrade and retrograde cold blood cardioplegia.  The crossclamp was applied, and 1 L of cold blood cardioplegia was delivered in split doses between the antegrade aortic and retrograde coronary sinus catheters.  There was good cardioplegic arrest, and septal temperature dropped less than 12 degrees.  Cardioplegia was delivered every 20 minutes or less while the crossclamp was in place.  The distal coronary anastomoses were performed.  The first distal anastomosis was the posterior descending branch of the right coronary. There was a proximal 95% stenosis.  A reverse saphenous vein was sewn end-to-side with running 7-0 Prolene with good flow through the graft. Cardioplegia was redosed.  The second distal anastomosis was the OM branch of the left circumflex. This had a proximal 95% stenosis.  A reverse saphenous vein was sewn end- to-side with running 7-0 Prolene with good flow through the graft. Cardioplegia was redosed.  The third distal anastomosis was to the distal third of the LAD.  The mammary artery and its distal aspect was very small, less than 1 mm. So, the mammary artery was shortened but was long enough to be placed at the best location of the heavily diffusely diseased LAD.  The LAD was a 1.2-mm vessel.  The mammary artery was sewn end-to-side with running 8-0 Prolene.  There was good flow through the graft after briefly releasing the pedicle bulldog on the mammary artery.  The bulldog was reapplied, and the pedicle was  secured to the epicardium.  Cardioplegia was redosed.  While the crossclamp was still in place, two proximal vein anastomoses were performed on the ascending aorta using a 4.0 mm punch running 6-0 Prolene.  Prior to removing the crossclamp, air was vented from the coronaries with a dose of retrograde warm blood cardioplegia.  The heart resumed a spontaneous rhythm.  The vein grafts were de-aired and opened, each had good flow and hemostasis was documented at the proximal and distal sites.  The cardioplegia lines were removed.  The patient was rewarmed and reperfused.  Temporary pacing wires were applied.  The lungs were expanded and ventilator was resumed.  When the patient was adequately reperfused, she was weaned off cardiopulmonary bypass on low-dose milrinone and epinephrine with excellent hemodynamics and improved global LV function on echocardiogram.  There was mild mitral regurgitation.  Protamine was administered without adverse reaction.  There was still some coagulopathy, and the patient received 1 unit of platelets and FFP with improved coagulation function.  The superior pericardial fat was closed over the aorta.  Anterior mediastinal and left pleural chest tubes were placed and brought out through separate incisions.  The sternum was closed with interrupted steel wire.  The pectoralis fascia  was closed with a running #1 Vicryl.  The subcutaneous and skin layers were closed with running Vicryl, and sterile dressings were applied. Total cardiopulmonary bypass time was 101 minutes.     Ivin Poot, M.D.     PV/MEDQ  D:  08/25/2014  T:  08/25/2014  Job:  [id number]  cc:   Rolland Porter, MD

## 2014-08-26 ENCOUNTER — Inpatient Hospital Stay (HOSPITAL_COMMUNITY): Payer: Medicare HMO

## 2014-08-26 LAB — CBC
HCT: 30.3 % — ABNORMAL LOW (ref 36.0–46.0)
Hemoglobin: 10.2 g/dL — ABNORMAL LOW (ref 12.0–15.0)
MCH: 27 pg (ref 26.0–34.0)
MCHC: 33.7 g/dL (ref 30.0–36.0)
MCV: 80.2 fL (ref 78.0–100.0)
Platelets: 68 10*3/uL — ABNORMAL LOW (ref 150–400)
RBC: 3.78 MIL/uL — ABNORMAL LOW (ref 3.87–5.11)
RDW: 14.9 % (ref 11.5–15.5)
WBC: 8.4 10*3/uL (ref 4.0–10.5)

## 2014-08-26 LAB — GLUCOSE, CAPILLARY
Glucose-Capillary: 125 mg/dL — ABNORMAL HIGH (ref 65–99)
Glucose-Capillary: 157 mg/dL — ABNORMAL HIGH (ref 65–99)
Glucose-Capillary: 157 mg/dL — ABNORMAL HIGH (ref 65–99)
Glucose-Capillary: 167 mg/dL — ABNORMAL HIGH (ref 65–99)
Glucose-Capillary: 199 mg/dL — ABNORMAL HIGH (ref 65–99)
Glucose-Capillary: 200 mg/dL — ABNORMAL HIGH (ref 65–99)
Glucose-Capillary: 89 mg/dL (ref 65–99)

## 2014-08-26 LAB — BASIC METABOLIC PANEL
Anion gap: 8 (ref 5–15)
BUN: 10 mg/dL (ref 6–20)
CO2: 26 mmol/L (ref 22–32)
Calcium: 8.2 mg/dL — ABNORMAL LOW (ref 8.9–10.3)
Chloride: 96 mmol/L — ABNORMAL LOW (ref 101–111)
Creatinine, Ser: 0.82 mg/dL (ref 0.44–1.00)
GFR calc Af Amer: >60 mL/min (ref >60)
GFR calc non Af Amer: >60 mL/min (ref >60)
Glucose, Bld: 215 mg/dL — ABNORMAL HIGH (ref 65–99)
Potassium: 3.8 mmol/L (ref 3.5–5.1)
Sodium: 130 mmol/L — ABNORMAL LOW (ref 135–145)

## 2014-08-26 LAB — CARBOXYHEMOGLOBIN
Carboxyhemoglobin: 1.3 % (ref 0.5–1.5)
Methemoglobin: 1 % (ref 0.0–1.5)
O2 Saturation: 76.8 %
Total hemoglobin: 10.5 g/dL — ABNORMAL LOW (ref 12.0–16.0)

## 2014-08-26 MED ORDER — TRAMADOL HCL 50 MG PO TABS
50.0000 mg | ORAL_TABLET | ORAL | Status: DC | PRN
  Administered 2014-08-28 – 2014-08-30 (×3): 50 mg via ORAL
  Filled 2014-08-26 (×3): qty 1

## 2014-08-26 MED ORDER — ASPIRIN EC 81 MG PO TBEC
81.0000 mg | DELAYED_RELEASE_TABLET | Freq: Every day | ORAL | Status: DC
  Administered 2014-08-27 – 2014-08-31 (×4): 81 mg via ORAL
  Filled 2014-08-26 (×5): qty 1

## 2014-08-26 MED ORDER — ASPIRIN 81 MG PO CHEW
81.0000 mg | CHEWABLE_TABLET | Freq: Every day | ORAL | Status: DC
  Administered 2014-08-30: 81 mg
  Filled 2014-08-26: qty 1

## 2014-08-26 MED ORDER — HYDRALAZINE HCL 20 MG/ML IJ SOLN
10.0000 mg | Freq: Four times a day (QID) | INTRAMUSCULAR | Status: DC | PRN
  Administered 2014-08-26 – 2014-08-29 (×3): 10 mg via INTRAVENOUS
  Filled 2014-08-26 (×3): qty 1

## 2014-08-26 MED ORDER — ZOLPIDEM TARTRATE 5 MG PO TABS: 5.0000 mg | ORAL_TABLET | Freq: Every evening | ORAL | Status: DC | PRN

## 2014-08-26 MED ORDER — ENALAPRIL MALEATE 2.5 MG PO TABS
2.5000 mg | ORAL_TABLET | Freq: Every day | ORAL | Status: DC
  Filled 2014-08-26 (×3): qty 1

## 2014-08-26 MED ORDER — CETYLPYRIDINIUM CHLORIDE 0.05 % MT LIQD
7.0000 mL | Freq: Two times a day (BID) | OROMUCOSAL | Status: DC
  Administered 2014-08-26 – 2014-08-30 (×7): 7 mL via OROMUCOSAL

## 2014-08-26 MED ORDER — HYDRALAZINE HCL 10 MG PO TABS
10.0000 mg | ORAL_TABLET | Freq: Four times a day (QID) | ORAL | Status: DC
  Administered 2014-08-27 – 2014-08-31 (×16): 10 mg via ORAL
  Filled 2014-08-26 (×23): qty 1

## 2014-08-26 MED ORDER — ALPRAZOLAM 0.25 MG PO TABS: 0.2500 mg | ORAL_TABLET | Freq: Two times a day (BID) | ORAL | Status: DC | PRN

## 2014-08-26 MED ORDER — FUROSEMIDE 10 MG/ML IJ SOLN
20.0000 mg | Freq: Two times a day (BID) | INTRAMUSCULAR | Status: AC
  Administered 2014-08-26 – 2014-08-27 (×2): 20 mg via INTRAVENOUS
  Filled 2014-08-26 (×2): qty 2

## 2014-08-26 NOTE — Progress Notes (Signed)
Patient ID: Misty Edwards, female   DOB: 26-Jan-1927, 81 y.o.   MRN: [id number] EVENING ROUNDS NOTE :     [address]       [place] [address]             [phone]                 2 Days Post-Op Procedure(s) (LRB): CORONARY ARTERY BYPASS GRAFTING (CABG) x three, using left internal mammary artery and right leg greater saphenous vein harvested endoscopically (N/A) TRANSESOPHAGEAL ECHOCARDIOGRAM (TEE) (N/A)  Total Length of Stay:  LOS: 1 day  BP 159/62 mmHg  Pulse 91  Temp(Src) 97.7 F (36.5 C) (Oral)  Resp 21  Ht 5\' 3"  (1.6 m)  Wt 152 lb 1.9 oz (69 kg)  BMI 26.95 kg/m2  SpO2 96%  .Intake/Output      06/11 0701 - 06/12 0700   P.O. 240   I.V. (mL/kg) 650.6 (9.4)   NG/GT    IV Piggyback 100   Total Intake(mL/kg) 990.6 (14.4)   Urine (mL/kg/hr) 675 (0.7)   Emesis/NG output    Chest Tube    Total Output 675   Net +315.6         . sodium chloride Stopped (08/25/14 1300)  . sodium chloride    . sodium chloride 20 mL/hr at 08/26/14 2000  . EPINEPHrine 4 mg in dextrose 5% 250 mL infusion (16 mcg/mL) Stopped (08/25/14 1500)  . lactated ringers 20 mL/hr at 08/26/14 2000  . lactated ringers 20 mL/hr at 08/25/14 0100  . milrinone 0.2 mcg/kg/min (08/26/14 2000)  . nitroGLYCERIN 50 mcg/min (08/26/14 2000)  . phenylephrine (NEO-SYNEPHRINE) Adult infusion Stopped (08/25/14 1800)     Lab Results  Component Value Date   WBC 8.4 08/26/2014   HGB 10.2* 08/26/2014   HCT 30.3* 08/26/2014   PLT 68* 08/26/2014   GLUCOSE 215* 08/26/2014   CHOL 126 01/13/2014   TRIG 112 01/13/2014   HDL 56 01/13/2014   LDLCALC 48 01/13/2014   ALT 37 08/24/2014   AST 62* 08/24/2014   NA 130* 08/26/2014   K 3.8 08/26/2014   CL 96* 08/26/2014   CREATININE 0.82 08/26/2014   BUN 10 08/26/2014   CO2 26 08/26/2014   TSH 0.753 08/21/2011   INR 1.46 08/25/2014   HGBA1C 6.4* 01/13/2014   MICROALBUR 1.0 01/13/2014   BP increased to 150-190's will add hydrazine   Grace Isaac MD  Beeper [phone] Office [phone] 08/26/2014 8:38 PM

## 2014-08-26 NOTE — Evaluation (Signed)
Physical Therapy Evaluation Patient Details Name: Misty Edwards MRN: [id number] DOB: 1926/12/02 Today's Date: 08/26/2014   History of Present Illness  Misty Edwards is a 81 y.o. female with no history of CAD. She has a history of hypertension, upper GI bleed secondary to gastritis, diabetes, chronic pancreatitis with pseudocyst and IBS. Pt came to ER due to chest pain. Pt s/p CABGx3 6/10.  Clinical Impression  Pt s/p CABGx3 presenting with generalized weakness and deconditioning. Pt was indep PTA and primary caregiver to spouse who has Alzheimers. Pt to benefit from ST-SNF to achieve safe mod I level of function for safe transition home.    Follow Up Recommendations SNF;Supervision/Assistance - 24 hour    Equipment Recommendations  Rolling walker with 5" wheels    Recommendations for Other Services       Precautions / Restrictions Precautions Precautions: Sternal Precaution Comments: educated on precaution pt with verbal understanding Required Braces or Orthoses:  (uses heart pillow during transfers to minimize UE use) Restrictions Weight Bearing Restrictions: No Other Position/Activity Restrictions: minimal use of UEs      Mobility  Bed Mobility               General bed mobility comments: pt up in chair upon PT arrival  Transfers Overall transfer level: Needs assistance Equipment used: None Transfers: Sit to/from Stand Sit to Stand: Min assist;Mod assist;+2 physical assistance         General transfer comment: pt hugged heart pillow, RN and PT assist pt with rocking forward and using momentum to rise up onto feet  Ambulation/Gait Ambulation/Gait assistance: +2 safety/equipment;Min assist Ambulation Distance (Feet): 150 Feet Assistive device:  (pushed WC) Gait Pattern/deviations: Step-through pattern;Decreased stride length;Narrow base of support Gait velocity: v/c's to slow down   General Gait Details: easily fatigued, pt mildly unsteady however normal  for first time up. no episodes of LOB, v/c's to slow down for enery conservation  Stairs            Wheelchair Mobility    Modified Rankin (Stroke Patients Only)       Balance Overall balance assessment: Needs assistance         Standing balance support: Bilateral upper extremity supported Standing balance-Leahy Scale: Poor Standing balance comment: needs RW                             Pertinent Vitals/Pain Pain Assessment: 0-10 Pain Score: 2  Pain Location: chest Pain Descriptors / Indicators: Discomfort    Home Living Family/patient expects to be discharged to:: Skilled nursing facility                 Additional Comments: pt lives with spouse who has alzheimers and is hi primary caregiver. Pt is legally blind and does not drive but was completely independent PTA.    Prior Function Level of Independence: Independent         Comments: did not use walker     Hand Dominance   Dominant Hand: Right    Extremity/Trunk Assessment   Upper Extremity Assessment: Overall WFL for tasks assessed (didn't MMT due to precautions, able to move easily)           Lower Extremity Assessment: Generalized weakness      Cervical / Trunk Assessment: Normal  Communication   Communication: No difficulties  Cognition Arousal/Alertness: Awake/alert Behavior During Therapy: WFL for tasks assessed/performed Overall Cognitive Status: Within Functional Limits for tasks  assessed                      General Comments General comments (skin integrity, edema, etc.): pt c/o fatigue due to not sleeping    Exercises General Exercises - Lower Extremity Ankle Circles/Pumps: AROM;Both;10 reps;Seated Long Arc Quad: AROM;Both;10 reps;Seated      Assessment/Plan    PT Assessment Patient needs continued PT services  PT Diagnosis Difficulty walking;Generalized weakness   PT Problem List Decreased strength;Decreased activity tolerance;Decreased  balance;Decreased mobility  PT Treatment Interventions DME instruction;Gait training;Functional mobility training;Therapeutic activities;Therapeutic exercise   PT Goals (Current goals can be found in the Care Plan section) Acute Rehab PT Goals Patient Stated Goal: get to rehab PT Goal Formulation: With patient Time For Goal Achievement: 09/02/14 Potential to Achieve Goals: Good    Frequency Min 3X/week   Barriers to discharge Decreased caregiver support husband has alzheimers    Co-evaluation               End of Session Equipment Utilized During Treatment: Oxygen (3LO2 via [place]) Activity Tolerance: Patient limited by fatigue Patient left: in chair;with call bell/phone within reach;with nursing/sitter in room Nurse Communication: Mobility status         Time: 0755-0820 PT Time Calculation (min) (ACUTE ONLY): 25 min   Charges:   PT Evaluation $Initial PT Evaluation Tier I: 1 Procedure PT Treatments $Gait Training: 8-22 mins   PT G CodesKingsley Callander 08/26/2014, 8:40 AM  Kittie Plater, PT, DPT Pager #: [phone] Office #: [phone]

## 2014-08-26 NOTE — Progress Notes (Signed)
Patient ID: Misty Edwards, female   DOB: 12-22-1926, 81 y.o.   MRN: [id number] TCTS DAILY ICU PROGRESS NOTE                   [place]            [place] [address]          [phone]   2 Days Post-Op Procedure(s) (LRB): CORONARY ARTERY BYPASS GRAFTING (CABG) x three, using left internal mammary artery and right leg greater saphenous vein harvested endoscopically (N/A) TRANSESOPHAGEAL ECHOCARDIOGRAM (TEE) (N/A)  Total Length of Stay:  LOS: 1 day   Subjective: Awake , walked 200 feet today with pt  Objective: Vital signs in last 24 hours: Temp:  [97.6 F (36.4 C)-99.5 F (37.5 C)] 97.9 F (36.6 C) (06/11 1136) Pulse Rate:  [76-97] 81 (06/11 1000) Cardiac Rhythm:  [-] Normal sinus rhythm (06/11 0800) Resp:  [16-30] 25 (06/11 1000) BP: (110-164)/(39-80) 148/64 mmHg (06/11 1000) SpO2:  [96 %-100 %] 100 % (06/11 1000) Arterial Line BP: (106-200)/(35-95) 177/60 mmHg (06/11 1000) Weight:  [152 lb 1.9 oz (69 kg)] 152 lb 1.9 oz (69 kg) (06/11 0600)  Filed Weights   08/24/14 1343 08/25/14 0428 08/26/14 0600  Weight: 140 lb (63.504 kg) 156 lb 15.5 oz (71.2 kg) 152 lb 1.9 oz (69 kg)    Weight change: 12 lb 1.9 oz (5.496 kg)   Hemodynamic parameters for last 24 hours: PAP: (29-31)/(10-14) 29/11 mmHg CO:  [5.7 L/min] 5.7 L/min CI:  [3.5 L/min/m2] 3.5 L/min/m2  Intake/Output from previous day: 06/10 0701 - 06/11 0700 In: 2819 [P.O.:180; I.V.:1709; NG/GT:30; IV Piggyback:900] Out: 1990 [SNKNL:9767; Emesis/NG output:200; Chest Tube:150]  Intake/Output this shift: Total I/O In: 209.3 [I.V.:159.3; IV Piggyback:50] Out: 150 [Urine:150]  Current Meds: Scheduled Meds: . acetaminophen  1,000 mg Oral 4 times per day   Or  . acetaminophen (TYLENOL) oral liquid 160 mg/5 mL  1,000 mg Per Tube 4 times per day  . antiseptic oral rinse  7 mL Mouth Rinse QID  . aspirin EC  325 mg Oral Daily   Or  . aspirin  324 mg Per Tube Daily  . bisacodyl  10 mg Oral Daily   Or  . bisacodyl  10 mg Rectal Daily  . cefUROXime (ZINACEF)  IV  1.5 g Intravenous Q12H  . chlorhexidine  15 mL Mouth Rinse BID  . cycloSPORINE  1 drop Both Eyes TID  . docusate sodium  200 mg Oral Daily  . insulin aspart  0-24 Units Subcutaneous 6 times per day  . insulin aspart  3 Units Subcutaneous TID WC  . insulin detemir  18 Units Subcutaneous BID  . insulin regular  0-10 Units Intravenous TID WC  . metoCLOPramide (REGLAN) injection  10 mg Intravenous 4 times per day  . metoprolol tartrate  12.5 mg Oral BID   Or  . metoprolol tartrate  12.5 mg Per Tube BID  . pantoprazole  40 mg Oral Daily  . rosuvastatin  40 mg Oral q1800  . sodium chloride  3 mL Intravenous Q12H   Continuous Infusions: . sodium chloride Stopped (08/25/14 1300)  . sodium chloride    . sodium chloride 20 mL/hr at 08/26/14 1000  . EPINEPHrine 4 mg in dextrose 5% 250 mL infusion (16 mcg/mL) Stopped (08/25/14 1500)  . lactated ringers 20 mL/hr at 08/26/14 1000  . lactated ringers 20 mL/hr at 08/25/14 0100  . milrinone 0.3 mcg/kg/min (08/26/14 1106)  . nitroGLYCERIN  20 mcg/min (08/26/14 1000)  . phenylephrine (NEO-SYNEPHRINE) Adult infusion Stopped (08/25/14 1800)   PRN Meds:.sodium chloride, ALPRAZolam, metoprolol, morphine injection, ondansetron (ZOFRAN) IV, oxyCODONE, sodium chloride, traMADol, zolpidem  General appearance: alert, cooperative and no distress Neurologic: intact Heart: regular rate and rhythm, S1, S2 normal, no murmur, click, rub or gallop Lungs: clear to auscultation bilaterally Abdomen: soft, non-tender; bowel sounds normal; no masses,  no organomegaly Extremities: extremities normal, atraumatic, no cyanosis or edema and Homans sign is negative, no sign of DVT Wound: sternum stable ,chest tubes are out  Lab Results: CBC: Recent Labs  08/25/14 1700 08/26/14 0400  WBC 8.2 8.4  HGB 10.2* 10.2*  HCT 30.1* 30.3*  PLT 89* 68*   BMET:  Recent Labs  08/25/14 0500 08/25/14 1604  08/25/14 1700 08/26/14 0400  NA 131* 134*  --  130*  K 3.3* 4.1  --  3.8  CL 98* 97*  --  96*  CO2 23  --   --  26  GLUCOSE 129* 120*  --  215*  BUN 14 11  --  10  CREATININE 0.97 0.80 0.85 0.82  CALCIUM 7.8*  --   --  8.2*    PT/INR:  Recent Labs  08/25/14 0045  LABPROT 17.8*  INR 1.46   Radiology: Dg Chest Port 1 View  08/26/2014   CLINICAL DATA:  Coronary artery bypass graft  EXAM: PORTABLE CHEST - 1 VIEW  COMPARISON:  08/25/2014  FINDINGS: The endotracheal tube and nasogastric tube have been removed. The left chest tube has been removed. The mediastinal drain has been removed. The Swan-Ganz catheter has been removed. The right jugular sheath remains.  There is no pneumothorax. There are mild basilar opacities, particularly on the left were there is worsened confluent consolidation or atelectasis.  IMPRESSION: Removal of ET tube, NG tube, left chest tube, mediastinal drain and Swan-Ganz catheter. Right jugular sheath remains.  Mildly worsened left base consolidation.   Electronically Signed   By: Andreas Newport M.D.   On: 08/26/2014 05:53     Assessment/Plan: S/P Procedure(s) (LRB): CORONARY ARTERY BYPASS GRAFTING (CABG) x three, using left internal mammary artery and right leg greater saphenous vein harvested endoscopically (N/A) TRANSESOPHAGEAL ECHOCARDIOGRAM (TEE) (N/A) Mobilize Diuresis Diabetes control Wean milrinone slowly  Thrombocytopenia- avoid heparin  D/c foley  Grace Isaac 08/26/2014 12:08 PM

## 2014-08-27 ENCOUNTER — Inpatient Hospital Stay (HOSPITAL_COMMUNITY): Payer: Medicare HMO

## 2014-08-27 LAB — CBC
HCT: 29 % — ABNORMAL LOW (ref 36.0–46.0)
Hemoglobin: 9.6 g/dL — ABNORMAL LOW (ref 12.0–15.0)
MCH: 26.7 pg (ref 26.0–34.0)
MCHC: 33.1 g/dL (ref 30.0–36.0)
MCV: 80.8 fL (ref 78.0–100.0)
Platelets: 69 10*3/uL — ABNORMAL LOW (ref 150–400)
RBC: 3.59 MIL/uL — ABNORMAL LOW (ref 3.87–5.11)
RDW: 14.9 % (ref 11.5–15.5)
WBC: 9.8 10*3/uL (ref 4.0–10.5)

## 2014-08-27 LAB — GLUCOSE, CAPILLARY
Glucose-Capillary: 120 mg/dL — ABNORMAL HIGH (ref 65–99)
Glucose-Capillary: 120 mg/dL — ABNORMAL HIGH (ref 65–99)
Glucose-Capillary: 135 mg/dL — ABNORMAL HIGH (ref 65–99)
Glucose-Capillary: 138 mg/dL — ABNORMAL HIGH (ref 65–99)
Glucose-Capillary: 192 mg/dL — ABNORMAL HIGH (ref 65–99)
Glucose-Capillary: 194 mg/dL — ABNORMAL HIGH (ref 65–99)
Glucose-Capillary: 71 mg/dL (ref 65–99)

## 2014-08-27 LAB — BASIC METABOLIC PANEL
Anion gap: 8 (ref 5–15)
BUN: 11 mg/dL (ref 6–20)
CO2: 29 mmol/L (ref 22–32)
Calcium: 8 mg/dL — ABNORMAL LOW (ref 8.9–10.3)
Chloride: 93 mmol/L — ABNORMAL LOW (ref 101–111)
Creatinine, Ser: 0.63 mg/dL (ref 0.44–1.00)
GFR calc Af Amer: >60 mL/min (ref >60)
GFR calc non Af Amer: >60 mL/min (ref >60)
Glucose, Bld: 146 mg/dL — ABNORMAL HIGH (ref 65–99)
Potassium: 3.2 mmol/L — ABNORMAL LOW (ref 3.5–5.1)
Sodium: 130 mmol/L — ABNORMAL LOW (ref 135–145)

## 2014-08-27 LAB — CARBOXYHEMOGLOBIN
Carboxyhemoglobin: 1.7 % — ABNORMAL HIGH (ref 0.5–1.5)
Methemoglobin: 1.2 % (ref 0.0–1.5)
O2 Saturation: 79.9 %
Total hemoglobin: 9.8 g/dL — ABNORMAL LOW (ref 12.0–16.0)

## 2014-08-27 MED ORDER — POTASSIUM CHLORIDE 10 MEQ/50ML IV SOLN
10.0000 meq | Freq: Once | INTRAVENOUS | Status: AC
  Administered 2014-08-27: 10 meq via INTRAVENOUS
  Filled 2014-08-27: qty 50

## 2014-08-27 MED ORDER — POTASSIUM CHLORIDE 10 MEQ/50ML IV SOLN
10.0000 meq | INTRAVENOUS | Status: AC
  Administered 2014-08-27 (×3): 10 meq via INTRAVENOUS

## 2014-08-27 MED ORDER — FUROSEMIDE 10 MG/ML IJ SOLN
40.0000 mg | Freq: Once | INTRAMUSCULAR | Status: AC
  Administered 2014-08-27: 40 mg via INTRAVENOUS
  Filled 2014-08-27: qty 4

## 2014-08-27 NOTE — Clinical Social Work Note (Signed)
Clinical Social Work Assessment  Patient Details  Name: Misty Edwards MRN: [id number] Date of Birth: [birth year]  Date of referral:  08/27/14               Reason for consult:  Discharge Planning, Facility Placement                Permission sought to share information with:  Facility Sport and exercise psychologist, Family Supports Permission granted to share information::  Yes, Verbal Permission Granted  Name::     Belinda [facility] (daughter)  Agency::  [facility] SNF's   Relationship::  daughter and grand daughter   Contact Information:  [phone]  Housing/Transportation Living arrangements for the past 2 months:  [place] of Information:  Patient Patient Interpreter Needed:  None Criminal Activity/Legal Involvement Pertinent to Current Situation/Hospitalization:  No - Comment as needed Significant Relationships:  Adult Children, Other Family Members, Spouse Lives with:  Spouse Do you feel safe going back to the place where you live?  Yes Need for family participation in patient care:  Yes (Comment) ([place] daughter Helayne [place]) will assist in finding a facility either in [place] or [place]. Pt could not remember her phone number)  Care giving concerns:  Pt has concerns about her spouse at home. Pt's spouse has Alzhiemers and she is the primary care taker. Pt stated it is an unexpected illness and she is "worried about him."  Facilities manager / plan: CSW met with the Pt at the bedside. CSW introduced self and reason for consult. Pt stated that she is currently working with PT and that she was having a difficult time walking down the hall. Pt agrees that she will need to seek SNF placement and stated, "I'm not going to be able to take care of him like this." Per Pt she has never been placed in a SNF prior to this hospitalization. Pt talked about her flowers that she recently planted and stated "If I had known that I was be here I would have never planted  those flowers." CSW provided supportive listening. Pt would like to search in both the [facility] and [employer]. Pt stated that her granddaughter is the family member that will be assisting with placement determination and that Pt's granddaughter works with Hospice.   Employment status:  Retired, Disabled (Comment on whether or not currently receiving Disability) (Not receiving disability) Insurance information:  Managed Medicare PT Recommendations:  [place] / Referral to community resources:  [facility]  Patient/Family's Response to care:  Pt was tired at the time of the assessment. Pt was appreciative of the assistance with SNF search.   Patient/Family's Understanding of and Emotional Response to Diagnosis, Current Treatment, and Prognosis:  Pt was able to explain the circumstances leading up to the admission and stated, "I didn't realize I was that sick." Pt is willing to participate in rehab so that she can return home to take care of her husband.   Emotional Assessment Appearance:  Appears stated age Attitude/Demeanor/Rapport:    Affect (typically observed):  Accepting, Stable, Pleasant Orientation:  Oriented to Self, Oriented to Place, Oriented to  Time, Oriented to Situation Alcohol / Substance use:  Not Applicable Psych involvement (Current and /or in the community):  No (Comment)  Discharge Needs  Concerns to be addressed:  Discharge Planning Concerns, Adjustment to Illness, Coping/Stress Concerns Readmission within the last 30 days:  No Current discharge risk:  None Barriers to Discharge:  No  Barriers Identified   Pete Pelt 08/27/2014, 5:27 PM

## 2014-08-27 NOTE — Progress Notes (Signed)
Patient ID: Misty Edwards, female   DOB: May 15, 1926, 81 y.o.   MRN: [id number] TCTS DAILY ICU PROGRESS NOTE                   [address]            [place], [address]          [phone]   3 Days Post-Op Procedure(s) (LRB): CORONARY ARTERY BYPASS GRAFTING (CABG) x three, using left internal mammary artery and right leg greater saphenous vein harvested endoscopically (N/A) TRANSESOPHAGEAL ECHOCARDIOGRAM (TEE) (N/A)  Total Length of Stay:  LOS: 2 days   Subjective: Confused during the night, better now, awake and talking, kniws where she is and time  Objective: Vital signs in last 24 hours: Temp:  [97.6 F (36.4 C)-98 F (36.7 C)] 97.8 F (36.6 C) (06/12 0734) Pulse Rate:  [73-111] 100 (06/12 1000) Cardiac Rhythm:  [-] Normal sinus rhythm (06/12 0800) Resp:  [14-29] 18 (06/12 1000) BP: (90-181)/(34-102) 96/38 mmHg (06/12 1000) SpO2:  [94 %-100 %] 97 % (06/12 1000) Arterial Line BP: (93-228)/(27-121) 129/31 mmHg (06/12 1000) Weight:  [152 lb 8.9 oz (69.2 kg)] 152 lb 8.9 oz (69.2 kg) (06/12 0600)  Filed Weights   08/25/14 0428 08/26/14 0600 08/27/14 0600  Weight: 156 lb 15.5 oz (71.2 kg) 152 lb 1.9 oz (69 kg) 152 lb 8.9 oz (69.2 kg)    Weight change: 7.1 oz (0.2 kg)   Hemodynamic parameters for last 24 hours:    Intake/Output from previous day: 06/11 0701 - 06/12 0700 In: 1832.4 [P.O.:360; I.V.:1272.4; IV Piggyback:200] Out: 2025 [Urine:2025]  Intake/Output this shift: Total I/O In: 211.7 [I.V.:161.7; IV Piggyback:50] Out: -   Current Meds: Scheduled Meds: . acetaminophen  1,000 mg Oral 4 times per day   Or  . acetaminophen (TYLENOL) oral liquid 160 mg/5 mL  1,000 mg Per Tube 4 times per day  . antiseptic oral rinse  7 mL Mouth Rinse BID  . aspirin EC  81 mg Oral Daily   Or  . aspirin  81 mg Per Tube Daily  . bisacodyl  10 mg Oral Daily   Or  . bisacodyl  10 mg Rectal Daily  . cycloSPORINE  1 drop Both Eyes TID  . docusate sodium  200  mg Oral Daily  . enalapril  2.5 mg Oral Daily  . furosemide  40 mg Intravenous Once  . hydrALAZINE  10 mg Oral 4 times per day  . insulin aspart  0-24 Units Subcutaneous 6 times per day  . insulin aspart  3 Units Subcutaneous TID WC  . insulin detemir  18 Units Subcutaneous BID  . metoCLOPramide (REGLAN) injection  10 mg Intravenous 4 times per day  . metoprolol tartrate  12.5 mg Oral BID   Or  . metoprolol tartrate  12.5 mg Per Tube BID  . pantoprazole  40 mg Oral Daily  . rosuvastatin  40 mg Oral q1800  . sodium chloride  3 mL Intravenous Q12H   Continuous Infusions: . sodium chloride Stopped (08/25/14 1300)  . sodium chloride    . sodium chloride 20 mL/hr at 08/27/14 1000  . EPINEPHrine 4 mg in dextrose 5% 250 mL infusion (16 mcg/mL) Stopped (08/25/14 1500)  . lactated ringers 20 mL/hr at 08/27/14 1000  . lactated ringers 20 mL/hr at 08/25/14 0100  . nitroGLYCERIN Stopped (08/27/14 1030)  . phenylephrine (NEO-SYNEPHRINE) Adult infusion Stopped (08/25/14 1800)   PRN Meds:.sodium chloride, hydrALAZINE, metoprolol, morphine injection,  ondansetron (ZOFRAN) IV, oxyCODONE, sodium chloride, traMADol  General appearance: alert and cooperative Neurologic: intact Heart: regular rate and rhythm, S1, S2 normal, no murmur, click, rub or gallop Lungs: diminished breath sounds bibasilar Abdomen: soft, non-tender; bowel sounds normal; no masses,  no organomegaly Extremities: extremities normal, atraumatic, no cyanosis or edema and Homans sign is negative, no sign of DVT Wound: sternum stable  Lab Results: CBC: Recent Labs  08/26/14 0400 08/27/14 0400  WBC 8.4 9.8  HGB 10.2* 9.6*  HCT 30.3* 29.0*  PLT 68* 69*   BMET:  Recent Labs  08/26/14 0400 08/27/14 0400  NA 130* 130*  K 3.8 3.2*  CL 96* 93*  CO2 26 29  GLUCOSE 215* 146*  BUN 10 11  CREATININE 0.82 0.63  CALCIUM 8.2* 8.0*    PT/INR:  Recent Labs  08/25/14 0045  LABPROT 17.8*  INR 1.46   Radiology: Dg Chest  Port 1 View  08/27/2014   CLINICAL DATA:  Status post CABG x3.  Hypertension.  EXAM: PORTABLE CHEST - 1 VIEW  COMPARISON:  1 day prior  FINDINGS: Right internal jugular Cordis sheath remains in place. Prior median sternotomy. Surgical clips in the right axilla. Midline trachea. Cardiomegaly accentuated by AP portable technique. Suspect a small layering left pleural effusion. No pneumothorax. Mild interstitial edema is similar. Left base airspace disease is not significantly changed.  IMPRESSION: No significant change since one day prior.  Mild interstitial edema with probable layering left pleural effusion.  Left base airspace disease.   Electronically Signed   By: Abigail Miyamoto M.D.   On: 08/27/2014 07:36     Assessment/Plan: S/P Procedure(s) (LRB): CORONARY ARTERY BYPASS GRAFTING (CABG) x three, using left internal mammary artery and right leg greater saphenous vein harvested endoscopically (N/A) TRANSESOPHAGEAL ECHOCARDIOGRAM (TEE) (N/A) Mobilize Diuresis d/c milinone today Still with low plts avoiding  heparin   Grace Isaac 08/27/2014 10:34 AM

## 2014-08-27 NOTE — Clinical Social Work Placement (Signed)
   CLINICAL SOCIAL WORK PLACEMENT  NOTE  Date:  08/27/2014  Patient Details  Name: BLYSS LUGAR MRN: [id number] Date of Birth: June 29, 1926  Clinical Social Work is seeking post-discharge placement for this patient at the [place] level of care (*CSW will initial, date and re-position this form in  chart as items are completed):  Yes   Patient/family provided with [facility] Work Department's list of facilities offering this level of care within the geographic area requested by the patient (or if unable, by the patient's family).  Yes   Patient/family informed of their freedom to choose among providers that offer the needed level of care, that participate in Medicare, Medicaid or managed care program needed by the patient, have an available bed and are willing to accept the patient.  Yes   Patient/family informed of [facility]'s ownership interest in [facility] and [facility], as well as of the fact that they are under no obligation to receive care at these facilities.  PASRR submitted to EDS on 08/27/14     PASRR number received on 08/27/14     Existing PASRR number confirmed on       FL2 transmitted to all facilities in geographic area requested by pt/family on 08/27/14     FL2 transmitted to all facilities within larger geographic area on 08/27/14     Patient informed that his/her managed care company has contracts with or will negotiate with certain facilities, including the following:        No   Patient/family informed of bed offers received.  Patient chooses bed at       Physician recommends and patient chooses bed at      Patient to be transferred to   on  .  Patient to be transferred to facility by       Patient family notified on   of transfer.  Name of family member notified:        PHYSICIAN       Additional Comment:    _______________________________________________ Pete Pelt 08/27/2014, 6:00 PM

## 2014-08-28 ENCOUNTER — Inpatient Hospital Stay (HOSPITAL_COMMUNITY): Payer: Medicare HMO

## 2014-08-28 DIAGNOSIS — Z951 Presence of aortocoronary bypass graft: Secondary | ICD-10-CM

## 2014-08-28 DIAGNOSIS — I249 Acute ischemic heart disease, unspecified: Secondary | ICD-10-CM

## 2014-08-28 LAB — GLUCOSE, CAPILLARY
Glucose-Capillary: 67 mg/dL (ref 65–99)
Glucose-Capillary: 68 mg/dL (ref 65–99)
Glucose-Capillary: 70 mg/dL (ref 65–99)
Glucose-Capillary: 78 mg/dL (ref 65–99)
Glucose-Capillary: 178 mg/dL — ABNORMAL HIGH (ref 65–99)
Glucose-Capillary: 162 mg/dL — ABNORMAL HIGH (ref 65–99)
Glucose-Capillary: 145 mg/dL — ABNORMAL HIGH (ref 65–99)

## 2014-08-28 LAB — CBC
HCT: 29.5 % — ABNORMAL LOW (ref 36.0–46.0)
Hemoglobin: 9.6 g/dL — ABNORMAL LOW (ref 12.0–15.0)
MCH: 26.9 pg (ref 26.0–34.0)
MCHC: 32.5 g/dL (ref 30.0–36.0)
MCV: 82.6 fL (ref 78.0–100.0)
Platelets: 74 10*3/uL — ABNORMAL LOW (ref 150–400)
RBC: 3.57 MIL/uL — ABNORMAL LOW (ref 3.87–5.11)
RDW: 15.1 % (ref 11.5–15.5)
WBC: 7.4 10*3/uL (ref 4.0–10.5)

## 2014-08-28 LAB — TYPE AND SCREEN
ABO/RH(D): A POS
Antibody Screen: NEGATIVE
UNIT DIVISION: 0
UNIT DIVISION: 0
Unit division: 0
Unit division: 0

## 2014-08-28 LAB — BASIC METABOLIC PANEL
Anion gap: 7 (ref 5–15)
BUN: 14 mg/dL (ref 6–20)
CO2: 32 mmol/L (ref 22–32)
Calcium: 8.4 mg/dL — ABNORMAL LOW (ref 8.9–10.3)
Chloride: 95 mmol/L — ABNORMAL LOW (ref 101–111)
Creatinine, Ser: 0.78 mg/dL (ref 0.44–1.00)
GFR calc Af Amer: >60 mL/min (ref >60)
GFR calc non Af Amer: >60 mL/min (ref >60)
Glucose, Bld: 71 mg/dL (ref 65–99)
Potassium: 3.4 mmol/L — ABNORMAL LOW (ref 3.5–5.1)
Sodium: 134 mmol/L — ABNORMAL LOW (ref 135–145)

## 2014-08-28 LAB — SURGICAL PCR SCREEN
MRSA, PCR: NEGATIVE
Staphylococcus aureus: NEGATIVE

## 2014-08-28 MED ORDER — INSULIN DETEMIR 100 UNIT/ML ~~LOC~~ SOLN
10.0000 [IU] | Freq: Two times a day (BID) | SUBCUTANEOUS | Status: DC
  Administered 2014-08-28 (×2): 10 [IU] via SUBCUTANEOUS
  Filled 2014-08-28 (×4): qty 0.1

## 2014-08-28 MED ORDER — FUROSEMIDE 40 MG PO TABS
40.0000 mg | ORAL_TABLET | Freq: Every day | ORAL | Status: DC
  Administered 2014-08-28: 40 mg via ORAL
  Filled 2014-08-28 (×2): qty 1

## 2014-08-28 MED ORDER — POTASSIUM CHLORIDE 10 MEQ/50ML IV SOLN
10.0000 meq | INTRAVENOUS | Status: AC
  Administered 2014-08-28 (×3): 10 meq via INTRAVENOUS
  Filled 2014-08-28 (×3): qty 50

## 2014-08-28 MED ORDER — METOPROLOL TARTRATE 25 MG PO TABS
25.0000 mg | ORAL_TABLET | Freq: Two times a day (BID) | ORAL | Status: DC
  Administered 2014-08-28 – 2014-08-29 (×3): 25 mg via ORAL
  Filled 2014-08-28 (×4): qty 1

## 2014-08-28 MED ORDER — ENALAPRIL MALEATE 5 MG PO TABS
5.0000 mg | ORAL_TABLET | Freq: Every day | ORAL | Status: DC
  Administered 2014-08-28 – 2014-08-30 (×3): 5 mg via ORAL
  Filled 2014-08-28 (×4): qty 1

## 2014-08-28 MED ORDER — ENSURE ENLIVE PO LIQD
237.0000 mL | Freq: Two times a day (BID) | ORAL | Status: DC
  Administered 2014-08-28 – 2014-08-31 (×7): 237 mL via ORAL

## 2014-08-28 MED ORDER — OXYCODONE HCL 5 MG PO TABS: 5.0000 mg | ORAL_TABLET | ORAL | Status: DC | PRN

## 2014-08-28 NOTE — Progress Notes (Signed)
4 Days Post-Op Procedure(s) (LRB): CORONARY ARTERY BYPASS GRAFTING (CABG) x three, using left internal mammary artery and right leg greater saphenous vein harvested endoscopically (N/A) TRANSESOPHAGEAL ECHOCARDIOGRAM (TEE) (N/A) Subjective: Making good progress 72 hours after emergency CABG Maintaining sinus rhythm Walking and hallway Elevated blood pressure being treated with beta blocker and Vasotec Chest x-ray with minimal edema Objective: Vital signs in last 24 hours: Temp:  [97.5 F (36.4 C)-97.8 F (36.6 C)] 97.8 F (36.6 C) (06/13 1537) Pulse Rate:  [73-109] 109 (06/13 1700) Cardiac Rhythm:  [-] Normal sinus rhythm (06/13 0800) Resp:  [11-37] 37 (06/13 1700) BP: (124-187)/(52-92) 187/80 mmHg (06/13 1700) SpO2:  [96 %-100 %] 98 % (06/13 1700) Weight:  [151 lb 3.8 oz (68.6 kg)] 151 lb 3.8 oz (68.6 kg) (06/13 0600)  Hemodynamic parameters for last 24 hours:   Stable Intake/Output from previous day: 06/12 0701 - 06/13 0700 In: 972.7 [P.O.:240; I.V.:582.7; IV Piggyback:150] Out: 1625 [Urine:1625] Intake/Output this shift: Total I/O In: 640 [P.O.:480; I.V.:60; IV Piggyback:100] Out: 850 [Urine:850]  Exam  Alert and oriented Lungs clear Heart rhythm regular without murmur or rub Extremities warm Abdomen soft Neuro intact  Lab Results:  Recent Labs  08/27/14 0400 08/28/14 0420  WBC 9.8 7.4  HGB 9.6* 9.6*  HCT 29.0* 29.5*  PLT 69* 74*   BMET:  Recent Labs  08/27/14 0400 08/28/14 0420  NA 130* 134*  K 3.2* 3.4*  CL 93* 95*  CO2 29 32  GLUCOSE 146* 71  BUN 11 14  CREATININE 0.63 0.78  CALCIUM 8.0* 8.4*    PT/INR: No results for input(s): LABPROT, INR in the last 72 hours. ABG    Component Value Date/Time   PHART 7.353 08/25/2014 1315   HCO3 22.4 08/25/2014 1315   TCO2 23 08/25/2014 1604   ACIDBASEDEF 3.0* 08/25/2014 1315   O2SAT 79.9 08/27/2014 0345   CBG (last 3)   Recent Labs  08/28/14 0719 08/28/14 1202 08/28/14 1535  GLUCAP 70 178*  162*    Assessment/Plan: S/P Procedure(s) (LRB): CORONARY ARTERY BYPASS GRAFTING (CABG) x three, using left internal mammary artery and right leg greater saphenous vein harvested endoscopically (N/A) TRANSESOPHAGEAL ECHOCARDIOGRAM (TEE) (N/A) Will adjust antihypertensives meds Will remove central line We'll continue diabetic  therapy and mobilization   LOS: 3 days    Tharon Aquas Trigt III 08/28/2014

## 2014-08-28 NOTE — Progress Notes (Signed)
Physical Therapy Treatment Patient Details Name: Misty Edwards MRN: [id number] DOB: 18-Jul-1926 Today's Date: 08/28/2014    History of Present Illness Misty Edwards is a 81 y.o. female with no history of CAD. She has a history of hypertension, upper GI bleed secondary to gastritis, diabetes, chronic pancreatitis with pseudocyst and IBS. Pt came to ER due to chest pain. Pt s/p CABGx3 6/10.    PT Comments    Pt making steady progress.  Follow Up Recommendations  SNF     Equipment Recommendations  Other (comment) (rollator)    Recommendations for Other Services       Precautions / Restrictions Precautions Precautions: Sternal Restrictions Weight Bearing Restrictions:  (sternal precautions)    Mobility  Bed Mobility Overal bed mobility: Needs Assistance Bed Mobility: Sit to Sidelying         Sit to sidelying: Mod assist General bed mobility comments: Assist to bring feet up  Transfers Overall transfer level: Needs assistance Equipment used: Pushed w/c Transfers: Sit to/from Stand Sit to Stand: Min assist;+2 physical assistance         General transfer comment: Pt hugged heart pillow one time and then placed hands on knees on second stand.  Ambulation/Gait Ambulation/Gait assistance: Min assist Ambulation Distance (Feet): 250 Feet Assistive device:  (pushed WC) Gait Pattern/deviations: Step-through pattern;Decreased stride length   Gait velocity interpretation: at or above normal speed for age/gender General Gait Details: Pt required 1 standing rest break.   Stairs            Wheelchair Mobility    Modified Rankin (Stroke Patients Only)       Balance Overall balance assessment: Needs assistance Sitting-balance support: No upper extremity supported;Feet supported Sitting balance-Leahy Scale: Good     Standing balance support: Bilateral upper extremity supported Standing balance-Leahy Scale: Poor Standing balance comment: support of w/c                     Cognition Arousal/Alertness: Awake/alert Behavior During Therapy: WFL for tasks assessed/performed Overall Cognitive Status: Within Functional Limits for tasks assessed                      Exercises      General Comments        Pertinent Vitals/Pain Pain Assessment: 0-10 Pain Score: 3  Pain Location: chest incision Pain Descriptors / Indicators: Operative site guarding Pain Intervention(s): Limited activity within patient's tolerance;Repositioned;Premedicated before session    Home Living                      Prior Function            PT Goals (current goals can now be found in the care plan section) Acute Rehab PT Goals Patient Stated Goal: get to rehab PT Goal Formulation: With patient Time For Goal Achievement: 09/02/14 Potential to Achieve Goals: Good    Frequency  Min 3X/week    PT Plan Discharge plan needs to be updated    Co-evaluation             End of Session Equipment Utilized During Treatment: Oxygen Activity Tolerance: Patient tolerated treatment well Patient left: with call bell/phone within reach;in bed     Time: 3419-6222 PT Time Calculation (min) (ACUTE ONLY): 19 min  Charges:  $Gait Training: 8-22 mins                    G Codes:  Zaharah Amir 08/28/2014, 9:59 AM  Suanne Marker PT [phone]

## 2014-08-28 NOTE — Progress Notes (Signed)
Attempted left arm PICC insertion via brachial and basilic veins.  Unable to thread guidewire.  Primary RN made aware.  Right restricted due to breast CA.

## 2014-08-28 NOTE — Clinical Social Work Note (Signed)
CSW attempted to see patient to discuss bed offers, patient was sleeping and did not wake up when CSW was going to meet with patient.  CSW will try tomorrow to present bed offers and discuss SNF placement options.  Jones Broom. [place], MSW, [place] 08/28/2014 5:25 PM

## 2014-08-28 NOTE — Progress Notes (Signed)
CT surgery p.m. Rounds  Sitting up in bed eating dinner Sinus rhythm Walking the hallway today Plan on transferring to stepdown tomorrow

## 2014-08-28 NOTE — Progress Notes (Signed)
Subjective:  POD #3 CABG X3 for LM/3VD, preserved LV Fxn, NSTEMI  Objective:  Temp:  [97.5 F (36.4 C)-98.4 F (36.9 C)] 97.7 F (36.5 C) (06/13 1204) Pulse Rate:  [73-104] 104 (06/13 1100) Resp:  [11-32] 25 (06/13 1100) BP: (124-180)/(52-92) 136/69 mmHg (06/13 1100) SpO2:  [96 %-100 %] 98 % (06/13 1100) Weight:  [151 lb 3.8 oz (68.6 kg)] 151 lb 3.8 oz (68.6 kg) (06/13 0600) Weight change: -1 lb 5.2 oz (-0.6 kg)  Intake/Output from previous day: 06/12 0701 - 06/13 0700 In: 972.7 [P.O.:240; I.V.:582.7; IV Piggyback:150] Out: 1625 [Urine:1625]  Intake/Output from this shift: Total I/O In: 400 [P.O.:240; I.V.:60; IV Piggyback:100] Out: 850 [Urine:850]  Physical Exam: General appearance: alert and no distress Neck: no adenopathy, no carotid bruit, no JVD, supple, symmetrical, trachea midline and thyroid not enlarged, symmetric, no tenderness/mass/nodules Lungs: clear to auscultation bilaterally Heart: regular rate and rhythm, S1, S2 normal, no murmur, click, rub or gallop Extremities: trace edema  Lab Results: Results for orders placed or performed during the hospital encounter of 08/24/14 (from the past 48 hour(s))  Glucose, capillary     Status: Abnormal   Collection Time: 08/26/14  3:58 PM  Result Value Ref Range   Glucose-Capillary 194 (H) 65 - 99 mg/dL   Comment 1 Capillary Specimen    Comment 2 Notify RN   Glucose, capillary     Status: Abnormal   Collection Time: 08/26/14  6:24 PM  Result Value Ref Range   Glucose-Capillary 157 (H) 65 - 99 mg/dL   Comment 1 Arterial Specimen   Glucose, capillary     Status: Abnormal   Collection Time: 08/26/14  7:37 PM  Result Value Ref Range   Glucose-Capillary 125 (H) 65 - 99 mg/dL   Comment 1 Notify RN    Comment 2 Document in Chart   Glucose, capillary     Status: Abnormal   Collection Time: 08/26/14 11:52 PM  Result Value Ref Range   Glucose-Capillary 135 (H) 65 - 99 mg/dL   Comment 1 Notify RN    Comment 2  Document in Chart   Carboxyhemoglobin     Status: Abnormal   Collection Time: 08/27/14  3:45 AM  Result Value Ref Range   Total hemoglobin 9.8 (L) 12.0 - 16.0 g/dL   O2 Saturation 79.9 %   Carboxyhemoglobin 1.7 (H) 0.5 - 1.5 %   Methemoglobin 1.2 0.0 - 1.5 %  Basic metabolic panel     Status: Abnormal   Collection Time: 08/27/14  4:00 AM  Result Value Ref Range   Sodium 130 (L) 135 - 145 mmol/L   Potassium 3.2 (L) 3.5 - 5.1 mmol/L   Chloride 93 (L) 101 - 111 mmol/L   CO2 29 22 - 32 mmol/L   Glucose, Bld 146 (H) 65 - 99 mg/dL   BUN 11 6 - 20 mg/dL   Creatinine, Ser 0.63 0.44 - 1.00 mg/dL   Calcium 8.0 (L) 8.9 - 10.3 mg/dL   GFR calc non Af Amer >60 >60 mL/min   GFR calc Af Amer >60 >60 mL/min    Comment: (NOTE) The eGFR has been calculated using the CKD EPI equation. This calculation has not been validated in all clinical situations. eGFR's persistently <60 mL/min signify possible Chronic Kidney Disease.    Anion gap 8 5 - 15  CBC     Status: Abnormal   Collection Time: 08/27/14  4:00 AM  Result Value Ref Range   WBC 9.8  4.0 - 10.5 K/uL   RBC 3.59 (L) 3.87 - 5.11 MIL/uL   Hemoglobin 9.6 (L) 12.0 - 15.0 g/dL   HCT 29.0 (L) 36.0 - 46.0 %   MCV 80.8 78.0 - 100.0 fL   MCH 26.7 26.0 - 34.0 pg   MCHC 33.1 30.0 - 36.0 g/dL   RDW 14.9 11.5 - 15.5 %   Platelets 69 (L) 150 - 400 K/uL    Comment: CONSISTENT WITH PREVIOUS RESULT  Glucose, capillary     Status: Abnormal   Collection Time: 08/27/14  7:33 AM  Result Value Ref Range   Glucose-Capillary 138 (H) 65 - 99 mg/dL   Comment 1 Capillary Specimen    Comment 2 Notify RN   Glucose, capillary     Status: Abnormal   Collection Time: 08/27/14 11:36 AM  Result Value Ref Range   Glucose-Capillary 192 (H) 65 - 99 mg/dL   Comment 1 Capillary Specimen    Comment 2 Notify RN   Glucose, capillary     Status: Abnormal   Collection Time: 08/27/14  4:19 PM  Result Value Ref Range   Glucose-Capillary 120 (H) 65 - 99 mg/dL   Comment 1  Capillary Specimen    Comment 2 Notify RN   Glucose, capillary     Status: Abnormal   Collection Time: 08/27/14  7:13 PM  Result Value Ref Range   Glucose-Capillary 120 (H) 65 - 99 mg/dL   Comment 1 Capillary Specimen   Glucose, capillary     Status: None   Collection Time: 08/27/14 11:18 PM  Result Value Ref Range   Glucose-Capillary 71 65 - 99 mg/dL   Comment 1 Capillary Specimen   Glucose, capillary     Status: None   Collection Time: 08/28/14  3:57 AM  Result Value Ref Range   Glucose-Capillary 67 65 - 99 mg/dL   Comment 1 Capillary Specimen   Basic metabolic panel     Status: Abnormal   Collection Time: 08/28/14  4:20 AM  Result Value Ref Range   Sodium 134 (L) 135 - 145 mmol/L   Potassium 3.4 (L) 3.5 - 5.1 mmol/L   Chloride 95 (L) 101 - 111 mmol/L   CO2 32 22 - 32 mmol/L   Glucose, Bld 71 65 - 99 mg/dL   BUN 14 6 - 20 mg/dL   Creatinine, Ser 0.78 0.44 - 1.00 mg/dL   Calcium 8.4 (L) 8.9 - 10.3 mg/dL   GFR calc non Af Amer >60 >60 mL/min   GFR calc Af Amer >60 >60 mL/min    Comment: (NOTE) The eGFR has been calculated using the CKD EPI equation. This calculation has not been validated in all clinical situations. eGFR's persistently <60 mL/min signify possible Chronic Kidney Disease.    Anion gap 7 5 - 15  CBC     Status: Abnormal   Collection Time: 08/28/14  4:20 AM  Result Value Ref Range   WBC 7.4 4.0 - 10.5 K/uL   RBC 3.57 (L) 3.87 - 5.11 MIL/uL   Hemoglobin 9.6 (L) 12.0 - 15.0 g/dL   HCT 29.5 (L) 36.0 - 46.0 %   MCV 82.6 78.0 - 100.0 fL   MCH 26.9 26.0 - 34.0 pg   MCHC 32.5 30.0 - 36.0 g/dL   RDW 15.1 11.5 - 15.5 %   Platelets 74 (L) 150 - 400 K/uL    Comment: CONSISTENT WITH PREVIOUS RESULT  Glucose, capillary     Status: None   Collection Time: 08/28/14  4:27 AM  Result Value Ref Range   Glucose-Capillary 68 65 - 99 mg/dL  Glucose, capillary     Status: None   Collection Time: 08/28/14  6:33 AM  Result Value Ref Range   Glucose-Capillary 78 65 - 99  mg/dL  Glucose, capillary     Status: None   Collection Time: 08/28/14  7:19 AM  Result Value Ref Range   Glucose-Capillary 70 65 - 99 mg/dL   Comment 1 Capillary Specimen    Comment 2 Notify RN   Glucose, capillary     Status: Abnormal   Collection Time: 08/28/14 12:02 PM  Result Value Ref Range   Glucose-Capillary 178 (H) 65 - 99 mg/dL   Comment 1 Capillary Specimen    Comment 2 Notify RN     Imaging: Imaging results have been reviewed  Assessment/Plan:   1. Principal Problem: 2.   NSTEMI (non-ST elevated myocardial infarction) 3. Active Problems: 4.   Diabetes mellitus type 2, insulin dependent 5.   Acute coronary syndrome 6.   S/P CABG x 3 7.   Time Spent Directly with Patient:  15 minutes  Length of Stay:  LOS: 3 days   Looks great!!!! POD #3 emergent CABG X3. LM/3VD with preserved LV fxn. VSS. Good sats. Off drips. Exam benign. Tx to tele per TCTS. Nl progression.  Quay Burow 08/28/2014, 2:33 PM

## 2014-08-28 NOTE — Progress Notes (Signed)
Peripherally Inserted Central Catheter/Midline Placement  The IV Nurse has discussed with the patient and/or persons authorized to consent for the patient, the purpose of this procedure and the potential benefits and risks involved with this procedure.  The benefits include less needle sticks, lab draws from the catheter and patient may be discharged home with the catheter.  Risks include, but not limited to, infection, bleeding, blood clot (thrombus formation), and puncture of an artery; nerve damage and irregular heat beat.  Alternatives to this procedure were also discussed.  PICC/Midline Placement Documentation        Misty Edwards 08/28/2014, 11:34 AM

## 2014-08-29 ENCOUNTER — Inpatient Hospital Stay (HOSPITAL_COMMUNITY): Payer: Medicare HMO

## 2014-08-29 LAB — BASIC METABOLIC PANEL
Anion gap: 11 (ref 5–15)
BUN: 14 mg/dL (ref 6–20)
CO2: 34 mmol/L — ABNORMAL HIGH (ref 22–32)
Calcium: 8.7 mg/dL — ABNORMAL LOW (ref 8.9–10.3)
Chloride: 87 mmol/L — ABNORMAL LOW (ref 101–111)
Creatinine, Ser: 0.66 mg/dL (ref 0.44–1.00)
GFR calc Af Amer: >60 mL/min (ref >60)
GFR calc non Af Amer: >60 mL/min (ref >60)
Glucose, Bld: 93 mg/dL (ref 65–99)
Potassium: 3.4 mmol/L — ABNORMAL LOW (ref 3.5–5.1)
Sodium: 132 mmol/L — ABNORMAL LOW (ref 135–145)

## 2014-08-29 LAB — CBC
HCT: 34.4 % — ABNORMAL LOW (ref 36.0–46.0)
Hemoglobin: 11.4 g/dL — ABNORMAL LOW (ref 12.0–15.0)
MCH: 27.1 pg (ref 26.0–34.0)
MCHC: 33.1 g/dL (ref 30.0–36.0)
MCV: 81.9 fL (ref 78.0–100.0)
Platelets: 108 10*3/uL — ABNORMAL LOW (ref 150–400)
RBC: 4.2 MIL/uL (ref 3.87–5.11)
RDW: 14.6 % (ref 11.5–15.5)
WBC: 8.1 10*3/uL (ref 4.0–10.5)

## 2014-08-29 LAB — GLUCOSE, CAPILLARY
Glucose-Capillary: 111 mg/dL — ABNORMAL HIGH (ref 65–99)
Glucose-Capillary: 160 mg/dL — ABNORMAL HIGH (ref 65–99)
Glucose-Capillary: 186 mg/dL — ABNORMAL HIGH (ref 65–99)
Glucose-Capillary: 257 mg/dL — ABNORMAL HIGH (ref 65–99)
Glucose-Capillary: 54 mg/dL — ABNORMAL LOW (ref 65–99)
Glucose-Capillary: 57 mg/dL — ABNORMAL LOW (ref 65–99)
Glucose-Capillary: 89 mg/dL (ref 65–99)

## 2014-08-29 LAB — URINE MICROSCOPIC-ADD ON

## 2014-08-29 LAB — URINALYSIS, ROUTINE W REFLEX MICROSCOPIC
Bilirubin Urine: NEGATIVE
Glucose, UA: 100 mg/dL — AB
Hgb urine dipstick: NEGATIVE
Ketones, ur: 15 mg/dL — AB
Leukocytes, UA: NEGATIVE
Nitrite: NEGATIVE
Protein, ur: 30 mg/dL — AB
Specific Gravity, Urine: 1.015 (ref 1.005–1.030)
UROBILINOGEN UA: 0.2 mg/dL (ref 0.0–1.0)
pH: 7 (ref 5.0–8.0)

## 2014-08-29 MED ORDER — POTASSIUM CHLORIDE CRYS ER 20 MEQ PO TBCR: 20.0000 meq | EXTENDED_RELEASE_TABLET | Freq: Two times a day (BID) | ORAL | Status: DC

## 2014-08-29 MED ORDER — POTASSIUM CHLORIDE CRYS ER 20 MEQ PO TBCR: 20.0000 meq | EXTENDED_RELEASE_TABLET | ORAL | Status: DC | PRN

## 2014-08-29 MED ORDER — POTASSIUM CHLORIDE CRYS ER 20 MEQ PO TBCR
20.0000 meq | EXTENDED_RELEASE_TABLET | Freq: Once | ORAL | Status: AC
  Administered 2014-08-29: 20 meq via ORAL
  Filled 2014-08-29: qty 1

## 2014-08-29 MED ORDER — INSULIN DETEMIR 100 UNIT/ML ~~LOC~~ SOLN
12.0000 [IU] | Freq: Two times a day (BID) | SUBCUTANEOUS | Status: DC
  Administered 2014-08-29: 12 [IU] via SUBCUTANEOUS
  Filled 2014-08-29 (×3): qty 0.12

## 2014-08-29 MED ORDER — DEXTROSE 50 % IV SOLN
INTRAVENOUS | Status: AC
  Filled 2014-08-29: qty 50

## 2014-08-29 MED ORDER — INSULIN DETEMIR 100 UNIT/ML ~~LOC~~ SOLN
12.0000 [IU] | Freq: Two times a day (BID) | SUBCUTANEOUS | Status: DC
  Filled 2014-08-29: qty 0.12

## 2014-08-29 MED ORDER — INSULIN ASPART 100 UNIT/ML ~~LOC~~ SOLN
0.0000 [IU] | Freq: Three times a day (TID) | SUBCUTANEOUS | Status: DC
  Administered 2014-08-29: 4 [IU] via SUBCUTANEOUS
  Administered 2014-08-29: 12 [IU] via SUBCUTANEOUS
  Administered 2014-08-30: 20 [IU] via SUBCUTANEOUS
  Administered 2014-08-30: 8 [IU] via SUBCUTANEOUS
  Administered 2014-08-30: 2 [IU] via SUBCUTANEOUS
  Administered 2014-08-31: 12 [IU] via SUBCUTANEOUS
  Administered 2014-08-31: 4 [IU] via SUBCUTANEOUS

## 2014-08-29 MED ORDER — PANCRELIPASE (LIP-PROT-AMYL) 12000-38000 UNITS PO CPEP
24000.0000 [IU] | ORAL_CAPSULE | Freq: Three times a day (TID) | ORAL | Status: DC
  Administered 2014-08-29 – 2014-08-31 (×6): 24000 [IU] via ORAL
  Filled 2014-08-29 (×8): qty 2

## 2014-08-29 MED ORDER — PROMETHAZINE HCL 12.5 MG PO TABS
6.2500 mg | ORAL_TABLET | Freq: Four times a day (QID) | ORAL | Status: DC | PRN
  Administered 2014-08-29 (×2): 6.25 mg via ORAL
  Filled 2014-08-29 (×3): qty 1

## 2014-08-29 MED ORDER — MAGNESIUM HYDROXIDE 400 MG/5ML PO SUSP
15.0000 mL | Freq: Every day | ORAL | Status: DC | PRN
  Administered 2014-08-29: 15 mL via ORAL
  Filled 2014-08-29: qty 30

## 2014-08-29 MED ORDER — LACTULOSE 10 GM/15ML PO SOLN
20.0000 g | Freq: Every day | ORAL | Status: DC | PRN
  Filled 2014-08-29: qty 30

## 2014-08-29 MED ORDER — DEXTROSE 50 % IV SOLN
25.0000 mL | Freq: Once | INTRAVENOUS | Status: AC
  Administered 2014-08-29: 25 mL via INTRAVENOUS

## 2014-08-29 MED ORDER — METOPROLOL TARTRATE 25 MG PO TABS
25.0000 mg | ORAL_TABLET | Freq: Three times a day (TID) | ORAL | Status: DC
  Administered 2014-08-29 (×2): 25 mg via ORAL
  Filled 2014-08-29 (×5): qty 1

## 2014-08-29 MED ORDER — POTASSIUM CHLORIDE CRYS ER 20 MEQ PO TBCR
20.0000 meq | EXTENDED_RELEASE_TABLET | ORAL | Status: DC
  Administered 2014-08-29: 20 meq via ORAL
  Filled 2014-08-29: qty 1

## 2014-08-29 NOTE — Progress Notes (Signed)
Hypoglycemic Event  CBG: 54  Treatment: 15 GM carbohydrate snack  Symptoms: None  Follow-up CBG: Time:0102 CBG Result: 57  Treatment: 28ml of D50  Possible Reasons for Event: Inadequate meal intake  Comments/MD notified: Orders per protocol, will continue to monitor pt closely.    Archie Endo L  Remember to initiate Hypoglycemia Order Set & complete

## 2014-08-29 NOTE — Clinical Social Work Note (Addendum)
CSW attempted to contact patient's family to discuss SNF bed offers, CSW left message for family awaiting for call back.  CSW received call back from patient's family at 1:00pm, CSW met with patient's daughter Misty Edwards to present bed offers.  Patient's daughter states she would like her mom to go to [facility] once patient is medically ready and discharge orders have been received.  CSW will continue to follow patient throughout discharge planning.   Jones Broom. Bent, MSW, [place] 08/29/2014 12:13 PM

## 2014-08-29 NOTE — Progress Notes (Signed)
Subjective:  POD# 5 CABG X4. Looks great. VSS. Not on any drips. Ambulating w/o difficulty  Objective:  Temp:  [97.6 F (36.4 C)-98 F (36.7 C)] 97.6 F (36.4 C) (06/14 1120) Pulse Rate:  [82-112] 104 (06/14 1200) Resp:  [14-37] 19 (06/14 1200) BP: (105-187)/(43-86) 118/43 mmHg (06/14 1200) SpO2:  [92 %-99 %] 93 % (06/14 1200) Weight:  [143 lb 11.8 oz (65.2 kg)] 143 lb 11.8 oz (65.2 kg) (06/14 0630) Weight change: -7 lb 7.9 oz (-3.4 kg)  Intake/Output from previous day: 06/13 0701 - 06/14 0700 In: 840 [P.O.:680; I.V.:60; IV Piggyback:100] Out: 1000 [Urine:1000]  Intake/Output from this shift:    Physical Exam: General appearance: alert and no distress Neck: no adenopathy, no carotid bruit, no JVD, supple, symmetrical, trachea midline and thyroid not enlarged, symmetric, no tenderness/mass/nodules Lungs: clear to auscultation bilaterally Heart: regular rate and rhythm, S1, S2 normal, no murmur, click, rub or gallop Extremities: extremities normal, atraumatic, no cyanosis or edema  Lab Results: Results for orders placed or performed during the hospital encounter of 08/24/14 (from the past 48 hour(s))  Glucose, capillary     Status: Abnormal   Collection Time: 08/27/14  4:19 PM  Result Value Ref Range   Glucose-Capillary 120 (H) 65 - 99 mg/dL   Comment 1 Capillary Specimen    Comment 2 Notify RN   Glucose, capillary     Status: Abnormal   Collection Time: 08/27/14  7:13 PM  Result Value Ref Range   Glucose-Capillary 120 (H) 65 - 99 mg/dL   Comment 1 Capillary Specimen   Glucose, capillary     Status: None   Collection Time: 08/27/14 11:18 PM  Result Value Ref Range   Glucose-Capillary 71 65 - 99 mg/dL   Comment 1 Capillary Specimen   Glucose, capillary     Status: None   Collection Time: 08/28/14  3:57 AM  Result Value Ref Range   Glucose-Capillary 67 65 - 99 mg/dL   Comment 1 Capillary Specimen   Basic metabolic panel     Status: Abnormal   Collection  Time: 08/28/14  4:20 AM  Result Value Ref Range   Sodium 134 (L) 135 - 145 mmol/L   Potassium 3.4 (L) 3.5 - 5.1 mmol/L   Chloride 95 (L) 101 - 111 mmol/L   CO2 32 22 - 32 mmol/L   Glucose, Bld 71 65 - 99 mg/dL   BUN 14 6 - 20 mg/dL   Creatinine, Ser 0.78 0.44 - 1.00 mg/dL   Calcium 8.4 (L) 8.9 - 10.3 mg/dL   GFR calc non Af Amer >60 >60 mL/min   GFR calc Af Amer >60 >60 mL/min    Comment: (NOTE) The eGFR has been calculated using the CKD EPI equation. This calculation has not been validated in all clinical situations. eGFR's persistently <60 mL/min signify possible Chronic Kidney Disease.    Anion gap 7 5 - 15  CBC     Status: Abnormal   Collection Time: 08/28/14  4:20 AM  Result Value Ref Range   WBC 7.4 4.0 - 10.5 K/uL   RBC 3.57 (L) 3.87 - 5.11 MIL/uL   Hemoglobin 9.6 (L) 12.0 - 15.0 g/dL   HCT 29.5 (L) 36.0 - 46.0 %   MCV 82.6 78.0 - 100.0 fL   MCH 26.9 26.0 - 34.0 pg   MCHC 32.5 30.0 - 36.0 g/dL   RDW 15.1 11.5 - 15.5 %   Platelets 74 (L) 150 - 400 K/uL  Comment: CONSISTENT WITH PREVIOUS RESULT  Glucose, capillary     Status: None   Collection Time: 08/28/14  4:27 AM  Result Value Ref Range   Glucose-Capillary 68 65 - 99 mg/dL  Glucose, capillary     Status: None   Collection Time: 08/28/14  6:33 AM  Result Value Ref Range   Glucose-Capillary 78 65 - 99 mg/dL  Glucose, capillary     Status: None   Collection Time: 08/28/14  7:19 AM  Result Value Ref Range   Glucose-Capillary 70 65 - 99 mg/dL   Comment 1 Capillary Specimen    Comment 2 Notify RN   Glucose, capillary     Status: Abnormal   Collection Time: 08/28/14 12:02 PM  Result Value Ref Range   Glucose-Capillary 178 (H) 65 - 99 mg/dL   Comment 1 Capillary Specimen    Comment 2 Notify RN   Glucose, capillary     Status: Abnormal   Collection Time: 08/28/14  3:35 PM  Result Value Ref Range   Glucose-Capillary 162 (H) 65 - 99 mg/dL   Comment 1 Capillary Specimen    Comment 2 Notify RN   Surgical pcr  screen     Status: None   Collection Time: 08/28/14  4:29 PM  Result Value Ref Range   MRSA, PCR NEGATIVE NEGATIVE   Staphylococcus aureus NEGATIVE NEGATIVE    Comment:        The Xpert SA Assay (FDA approved for NASAL specimens in patients over 15 years of age), is one component of a comprehensive surveillance program.  Test performance has been validated by [facility] for patients greater than or equal to 35 year old. It is not intended to diagnose infection nor to guide or monitor treatment.   Glucose, capillary     Status: Abnormal   Collection Time: 08/28/14  7:16 PM  Result Value Ref Range   Glucose-Capillary 145 (H) 65 - 99 mg/dL   Comment 1 Notify RN    Comment 2 Document in Chart   Glucose, capillary     Status: Abnormal   Collection Time: 08/28/14 11:54 PM  Result Value Ref Range   Glucose-Capillary 54 (L) 65 - 99 mg/dL   Comment 1 Notify RN    Comment 2 Document in Chart   Glucose, capillary     Status: Abnormal   Collection Time: 08/29/14  1:02 AM  Result Value Ref Range   Glucose-Capillary 57 (L) 65 - 99 mg/dL  CBC     Status: Abnormal   Collection Time: 08/29/14  2:49 AM  Result Value Ref Range   WBC 8.1 4.0 - 10.5 K/uL   RBC 4.20 3.87 - 5.11 MIL/uL   Hemoglobin 11.4 (L) 12.0 - 15.0 g/dL   HCT 34.4 (L) 36.0 - 46.0 %   MCV 81.9 78.0 - 100.0 fL   MCH 27.1 26.0 - 34.0 pg   MCHC 33.1 30.0 - 36.0 g/dL   RDW 14.6 11.5 - 15.5 %   Platelets 108 (L) 150 - 400 K/uL    Comment: CONSISTENT WITH PREVIOUS RESULT  Basic metabolic panel     Status: Abnormal   Collection Time: 08/29/14  2:49 AM  Result Value Ref Range   Sodium 132 (L) 135 - 145 mmol/L   Potassium 3.4 (L) 3.5 - 5.1 mmol/L   Chloride 87 (L) 101 - 111 mmol/L   CO2 34 (H) 22 - 32 mmol/L   Glucose, Bld 93 65 - 99 mg/dL   BUN 14  6 - 20 mg/dL   Creatinine, Ser 0.66 0.44 - 1.00 mg/dL   Calcium 8.7 (L) 8.9 - 10.3 mg/dL   GFR calc non Af Amer >60 >60 mL/min   GFR calc Af Amer >60 >60 mL/min    Comment:  (NOTE) The eGFR has been calculated using the CKD EPI equation. This calculation has not been validated in all clinical situations. eGFR's persistently <60 mL/min signify possible Chronic Kidney Disease.    Anion gap 11 5 - 15  Glucose, capillary     Status: None   Collection Time: 08/29/14  2:49 AM  Result Value Ref Range   Glucose-Capillary 89 65 - 99 mg/dL  Glucose, capillary     Status: Abnormal   Collection Time: 08/29/14  7:12 AM  Result Value Ref Range   Glucose-Capillary 111 (H) 65 - 99 mg/dL   Comment 1 Capillary Specimen    Comment 2 Notify RN   Urinalysis, Routine w reflex microscopic (not at [facility])     Status: Abnormal   Collection Time: 08/29/14 10:30 AM  Result Value Ref Range   Color, Urine YELLOW YELLOW   APPearance CLEAR CLEAR   Specific Gravity, Urine 1.015 1.005 - 1.030   pH 7.0 5.0 - 8.0   Glucose, UA 100 (A) NEGATIVE mg/dL   Hgb urine dipstick NEGATIVE NEGATIVE   Bilirubin Urine NEGATIVE NEGATIVE   Ketones, ur 15 (A) NEGATIVE mg/dL   Protein, ur 30 (A) NEGATIVE mg/dL   Urobilinogen, UA 0.2 0.0 - 1.0 mg/dL   Nitrite NEGATIVE NEGATIVE   Leukocytes, UA NEGATIVE NEGATIVE  Urine microscopic-add on     Status: Abnormal   Collection Time: 08/29/14 10:30 AM  Result Value Ref Range   Squamous Epithelial / LPF FEW (A) RARE   WBC, UA 0-2 <3 WBC/hpf  Glucose, capillary     Status: Abnormal   Collection Time: 08/29/14 11:17 AM  Result Value Ref Range   Glucose-Capillary 186 (H) 65 - 99 mg/dL   Comment 1 Capillary Specimen    Comment 2 Notify RN     Imaging: Imaging results have been reviewed  Assessment/Plan:   1. Principal Problem: 2.   NSTEMI (non-ST elevated myocardial infarction) 3. Active Problems: 4.   Diabetes mellitus type 2, insulin dependent 5.   Acute coronary syndrome 6.   S/P CABG x 3 7.   Time Spent Directly with Patient:  15 minutes  Length of Stay:  LOS: 4 days   Looks great, POD #5. Agree with advancing BB. Exam benign. Labs OK.  [facility]. Tx to tele per TCTS. Nl progression  Quay Burow 08/29/2014, 2:06 PM

## 2014-08-29 NOTE — Progress Notes (Addendum)
TCTS DAILY ICU PROGRESS NOTE                   [address]            [place] [address]          [phone]   5 Days Post-Op Procedure(s) (LRB): CORONARY ARTERY BYPASS GRAFTING (CABG) x three, using left internal mammary artery and right leg greater saphenous vein harvested endoscopically (N/A) TRANSESOPHAGEAL ECHOCARDIOGRAM (TEE) (N/A)  Total Length of Stay:  LOS: 4 days   Subjective: Nauseated this am. No BM yet, but passing flatus. Upset because she had some urinary incontinence last pm. Rested poorly.    Objective: Vital signs in last 24 hours: Temp:  [97.6 F (36.4 C)-98 F (36.7 C)] 97.7 F (36.5 C) (06/14 0715) Pulse Rate:  [82-110] 107 (06/14 0700) Cardiac Rhythm:  [-] Normal sinus rhythm (06/14 0400) Resp:  [14-37] 30 (06/14 0700) BP: (122-187)/(49-92) 142/52 mmHg (06/14 0700) SpO2:  [92 %-99 %] 95 % (06/14 0700) Weight:  [143 lb 11.8 oz (65.2 kg)] 143 lb 11.8 oz (65.2 kg) (06/14 0630)  Filed Weights   08/27/14 0600 08/28/14 0600 08/29/14 0630  Weight: 152 lb 8.9 oz (69.2 kg) 151 lb 3.8 oz (68.6 kg) 143 lb 11.8 oz (65.2 kg)  PRE-OPERATIVE WEIGHT: 63kg  Weight change: -7 lb 7.9 oz (-3.4 kg)   Hemodynamic parameters for last 24 hours:    Intake/Output from previous day: 06/13 0701 - 06/14 0700 In: 840 [P.O.:680; I.V.:60; IV Piggyback:100] Out: 1000 [Urine:1000]    CBGs [id number]  Current Meds: Scheduled Meds: . acetaminophen  1,000 mg Oral 4 times per day   Or  . acetaminophen (TYLENOL) oral liquid 160 mg/5 mL  1,000 mg Per Tube 4 times per day  . antiseptic oral rinse  7 mL Mouth Rinse BID  . aspirin EC  81 mg Oral Daily   Or  . aspirin  81 mg Per Tube Daily  . bisacodyl  10 mg Oral Daily   Or  . bisacodyl  10 mg Rectal Daily  . cycloSPORINE  1 drop Both Eyes TID  . docusate sodium  200 mg Oral Daily  . enalapril  5 mg Oral Daily  . feeding supplement (ENSURE ENLIVE)  237 mL Oral BID BM  . furosemide  40 mg Oral Daily   . hydrALAZINE  10 mg Oral 4 times per day  . insulin aspart  0-24 Units Subcutaneous 6 times per day  . insulin aspart  3 Units Subcutaneous TID WC  . insulin detemir  10 Units Subcutaneous BID  . metoprolol tartrate  25 mg Oral BID  . pantoprazole  40 mg Oral Daily  . potassium chloride  20 mEq Oral 6 times per day  . rosuvastatin  40 mg Oral q1800  . sodium chloride  3 mL Intravenous Q12H   Continuous Infusions: . sodium chloride Stopped (08/25/14 1300)  . sodium chloride    . nitroGLYCERIN Stopped (08/27/14 1030)   PRN Meds:.sodium chloride, hydrALAZINE, metoprolol, ondansetron (ZOFRAN) IV, oxyCODONE, sodium chloride, traMADol   Physical Exam: General appearance: alert, cooperative and no distress Heart: regular rate and rhythm Lungs: diminished breath sounds bibasilar Abdomen: soft, non-tender; bowel sounds normal; no masses,  no organomegaly Extremities: No edema Wound: Clean and dry    Lab Results: CBC: Recent Labs  08/28/14 0420 08/29/14 0249  WBC 7.4 8.1  HGB 9.6* 11.4*  HCT 29.5* 34.4*  PLT 74* 108*   BMET:  Recent Labs  08/28/14 0420 08/29/14 0249  NA 134* 132*  K 3.4* 3.4*  CL 95* 87*  CO2 32 34*  GLUCOSE 71 93  BUN 14 14  CREATININE 0.78 0.66  CALCIUM 8.4* 8.7*    PT/INR: No results for input(s): LABPROT, INR in the last 72 hours. Radiology: Dg Chest 2 View  08/29/2014   CLINICAL DATA:  Post CABG  EXAM: CHEST  2 VIEW  COMPARISON:  08/28/2014  FINDINGS: Cardiomediastinal silhouette is stable. The patient is status post CABG. Surgical clips in right axilla again noted. No acute infiltrate or pulmonary edema. Slight improvement in aeration. Right IJ central line has been removed. Small residual left pleural effusion with left basilar atelectasis. No segmental infiltrate.  Mild right basilar atelectasis.  IMPRESSION: Status post CABG. Small residual left pleural effusion with left basilar atelectasis. Mild right basilar atelectasis. No segmental  infiltrate or pulmonary edema.   Electronically Signed   By: Lahoma Crocker M.D.   On: 08/29/2014 07:55     Assessment/Plan: S/P Procedure(s) (LRB): CORONARY ARTERY BYPASS GRAFTING (CABG) x three, using left internal mammary artery and right leg greater saphenous vein harvested endoscopically (N/A) TRANSESOPHAGEAL ECHOCARDIOGRAM (TEE) (N/A)  CV- Tachy this am, BPs trending up.  Will increase beta blocker, continue low dose ACE-I.  Pulm- Off O2, sats stable.  GI- nausea this am.  Will give LOC today since no BM yet, add low dose Phenergan, which she takes at home.  DM- hypoglycemic overnight.  Will d/c Levemir and continue SSI until eating better, then plan resume Metformin.  Vol overload- Near baseline weight, not edematous. Will d/c Lasix and watch.  Hypokalemia- replace K+.  Continue CRPI, tx PTCU.   COLLINS,GINA H 08/29/2014 8:21 AM  patient examined and medical record reviewed,agree with above note. Transfer to stepdown Tharon Aquas Trigt III 08/29/2014

## 2014-08-30 DIAGNOSIS — R5381 Other malaise: Secondary | ICD-10-CM

## 2014-08-30 LAB — GLUCOSE, CAPILLARY
Glucose-Capillary: 158 mg/dL — ABNORMAL HIGH (ref 65–99)
Glucose-Capillary: 374 mg/dL — ABNORMAL HIGH (ref 65–99)
Glucose-Capillary: 236 mg/dL — ABNORMAL HIGH (ref 65–99)
Glucose-Capillary: 105 mg/dL — ABNORMAL HIGH (ref 65–99)

## 2014-08-30 MED ORDER — METOPROLOL TARTRATE 50 MG PO TABS
50.0000 mg | ORAL_TABLET | Freq: Two times a day (BID) | ORAL | Status: DC
  Administered 2014-08-30 – 2014-08-31 (×3): 50 mg via ORAL
  Filled 2014-08-30 (×4): qty 1

## 2014-08-30 MED ORDER — METFORMIN HCL ER 500 MG PO TB24
500.0000 mg | ORAL_TABLET | Freq: Every day | ORAL | Status: DC
  Administered 2014-08-31: 500 mg via ORAL
  Filled 2014-08-30 (×2): qty 1

## 2014-08-30 NOTE — Progress Notes (Signed)
Utilization review completed.  

## 2014-08-30 NOTE — Progress Notes (Signed)
Physical Therapy Treatment Patient Details Name: Misty Edwards MRN: [id number] DOB: May 31, 1926 Today's Date: 08/30/2014    History of Present Illness Misty Edwards is a 81 y.o. female with no history of CAD. She has a history of hypertension, upper GI bleed secondary to gastritis, diabetes, chronic pancreatitis with pseudocyst and IBS. Pt came to ER due to chest pain. Pt s/p CABGx3 6/10.    PT Comments    Progressing towards physical therapy goals at a min assist level for transfer and ambulation. HR elevated up to 128 while ambulating, SpO2 96% on room air. Patient will continue to benefit from skilled physical therapy services to further improve independence with functional mobility.   Follow Up Recommendations  SNF     Equipment Recommendations  Other (comment) (rollator)    Recommendations for Other Services       Precautions / Restrictions Precautions Precautions: Sternal Precaution Comments: educated on precaution Restrictions Weight Bearing Restrictions: Yes Other Position/Activity Restrictions: sternal precautions    Mobility  Bed Mobility               General bed mobility comments: sitting in chair  Transfers Overall transfer level: Needs assistance Equipment used: Rolling walker (2 wheeled) Transfers: Sit to/from Stand Sit to Stand: Min assist         General transfer comment: Min assist for stability while rising. VC for hand placement  Ambulation/Gait Ambulation/Gait assistance: Min assist Ambulation Distance (Feet): 100 Feet Assistive device: Rolling walker (2 wheeled) Gait Pattern/deviations: Step-through pattern;Decreased stride length;Trunk flexed Gait velocity: cues to ambulate slowly due to elevated HR   General Gait Details: VC for light hand placement on RW for stability to maintain sternal precautions. VC for upright posture and correct use of DME. Min assist intermittently for RW use and navigation. HR elevated to 128 at highest,  SpO2 96% on room air.   Stairs            Wheelchair Mobility    Modified Rankin (Stroke Patients Only)       Balance                                    Cognition Arousal/Alertness: Awake/alert Behavior During Therapy: WFL for tasks assessed/performed Overall Cognitive Status: Within Functional Limits for tasks assessed       Memory: Decreased recall of precautions              Exercises General Exercises - Lower Extremity Ankle Circles/Pumps: AROM;Both;10 reps;Seated Long Arc Quad: AROM;Both;10 reps;Seated Hip Flexion/Marching: AROM;Strengthening;Both;10 reps;Seated    General Comments General comments (skin integrity, edema, etc.): HR 110 at rest, elevated to 128 while ambulating. Down to 120 after sitting.      Pertinent Vitals/Pain Pain Assessment: 0-10 Pain Score:  ("doing pretty good" no value given) Pain Location: chest Pain Descriptors / Indicators: Aching Pain Intervention(s): Monitored during session;Repositioned;Premedicated before session    Home Living                      Prior Function            PT Goals (current goals can now be found in the care plan section) Acute Rehab PT Goals Patient Stated Goal: get to rehab PT Goal Formulation: With patient Time For Goal Achievement: 09/02/14 Potential to Achieve Goals: Good Progress towards PT goals: Progressing toward goals    Frequency  Min 3X/week  PT Plan Current plan remains appropriate    Co-evaluation             End of Session   Activity Tolerance: Patient tolerated treatment well Patient left: with call bell/phone within reach;in chair;with family/visitor present     Time: 4327-6147 PT Time Calculation (min) (ACUTE ONLY): 13 min  Charges:  $Gait Training: 8-22 mins                    G Codes:      Ellouise Newer Sep 07, 2014, 10:42 AM Elayne Snare, [place]

## 2014-08-30 NOTE — Progress Notes (Signed)
CSW contacted [facility] to inform of pt choice- they will start insurance auth this morning.  CSW will continue to follow.  Domenica Reamer, [place] Social Worker [phone]

## 2014-08-30 NOTE — Clinical Social Work Note (Signed)
Patient transferred to 2W20 from 2S15, handoff given to unit CSW, patient and family would like to go to [facility], this CSW to sign off.  Jones Broom. Max, MSW, [place] 08/30/2014 8:39 AM

## 2014-08-30 NOTE — Progress Notes (Signed)
       [address]       [place] [address]             [phone]          6 Days Post-Op Procedure(s) (LRB): CORONARY ARTERY BYPASS GRAFTING (CABG) x three, using left internal mammary artery and right leg greater saphenous vein harvested endoscopically (N/A) TRANSESOPHAGEAL ECHOCARDIOGRAM (TEE) (N/A)  Subjective: Feels much better today. Nausea resolved and eating better.  No BM yet and still weak.   Objective: Vital signs in last 24 hours: Patient Vitals for the past 24 hrs:  BP Temp Temp src Pulse Resp SpO2 Weight  08/30/14 0530 (!) 164/92 mmHg 97.8 F (36.6 C) Oral 97 18 95 % 141 lb 15.6 oz (64.4 kg)  08/29/14 2335 (!) 150/70 mmHg - - - - - -  08/29/14 2023 (!) 148/65 mmHg 97.8 F (36.6 C) Oral 95 18 96 % -  08/29/14 1819 (!) 160/78 mmHg - - - - - -  08/29/14 1722 (!) 141/59 mmHg 98.1 F (36.7 C) Oral 98 18 95 % -  08/29/14 1602 - 97.5 F (36.4 C) Oral - - - -  08/29/14 1600 (!) 154/65 mmHg - - 95 (!) 26 94 % -  08/29/14 1500 (!) 125/50 mmHg - - 86 (!) 27 94 % -  08/29/14 1400 (!) 122/49 mmHg - - 92 (!) 26 96 % -  08/29/14 1300 (!) 132/56 mmHg - - (!) 106 (!) 37 93 % -  08/29/14 1200 (!) 118/43 mmHg - - (!) 104 19 93 % -  08/29/14 1120 - 97.6 F (36.4 C) Oral - - - -  08/29/14 1100 (!) 156/73 mmHg - - (!) 109 (!) 37 95 % -  08/29/14 1000 (!) 134/56 mmHg - - (!) 105 (!) 34 96 % -  08/29/14 0900 (!) 105/49 mmHg - - (!) 112 (!) 31 96 % -   Current Weight  08/30/14 141 lb 15.6 oz (64.4 kg)  PRE-OPERATIVE WEIGHT: 63kg   Intake/Output from previous day: 06/14 0701 - 06/15 0700 In: 360 [P.O.:360] Out: 2 [Stool:2]  CBGs 160-158   PHYSICAL EXAM:  Heart: Tachy, 110s Lungs: Clear Wound: Clean and dry Abdomen: Soft, NT/ND. +BS Extremities: No significant edema    Lab Results: CBC: Recent Labs  08/28/14 0420 08/29/14 0249  WBC 7.4 8.1  HGB 9.6* 11.4*  HCT 29.5* 34.4*  PLT 74* 108*   BMET:  Recent Labs  08/28/14 0420  08/29/14 0249  NA 134* 132*  K 3.4* 3.4*  CL 95* 87*  CO2 32 34*  GLUCOSE 71 93  BUN 14 14  CREATININE 0.78 0.66  CALCIUM 8.4* 8.7*    PT/INR: No results for input(s): LABPROT, INR in the last 72 hours.    Assessment/Plan: S/P Procedure(s) (LRB): CORONARY ARTERY BYPASS GRAFTING (CABG) x three, using left internal mammary artery and right leg greater saphenous vein harvested endoscopically (N/A) TRANSESOPHAGEAL ECHOCARDIOGRAM (TEE) (N/A)  CV- Remains tachy this am, BPs high. Will increase beta blocker further, continue low dose ACE-I.  GI- nausea this am. Will give LOC today since no BM yet, continue Phenergan, which she takes at home.  Thrombocytopenia- plts improved.  DM-  CBGs improved off Levemir, continue SSI. Will resume Metformin.  Hypokalemia- replace K+.  Continue CRPI, PT.  Disp- to SNF possibly later this week if she continues to progress.   LOS: 5 days    Kenniyah Sasaki H 08/30/2014

## 2014-08-30 NOTE — Consult Note (Signed)
Physical Medicine and Rehabilitation Consult Reason for Consult: Debilitation after NSTEMI/CABG Referring Physician: Dr. Lucianne Lei trigt   HPI: Tomma Ehinger Zellers is a 81 y.o. right handed female with history of hypertension, diabetes mellitus and peripheral neuropathy, upper GI bleed, legally blind. Independent prior to admission and she is the main caregiver for her husband who has Alzheimer's disease. Patient recently seen by PCP for nonspecific chest pain 1 week. An EKG was completed that showed anterior lateral deep ST depression at rest and 2 mm ST elevation in AVR. She went to the [facility] emergency room found to haveNSTEMI and started on intravenous heparin. She was admitted 08/24/2014 for further evaluation. Troponin 3.44. She was transferred to [facility] and underwent cardiac catheterization per Dr. Ellyn Hack that demonstrated 95% left main stenosis with chronic occlusion of the LAD, tight circumflex stenosis and 95% stenosis of the right coronary artery. Underwent emergent CABG 3 with endoscopic harvest of right leg greater saphenous vein 08/25/2014 per Dr. Lucianne Lei trigt. Hospital course pain management. Maintain on aspirin therapy. Tolerating a regular consistency diet. Physical therapy evaluation completed and ongoing noted sternal precautions. M.D. has requested physical medicine rehabilitation consult.   Review of Systems  Constitutional: Negative for fever and chills.  HENT: Negative for hearing loss.   Eyes:       Legally blind  Respiratory: Positive for shortness of breath. Negative for cough.   Cardiovascular: Positive for chest pain and palpitations.  Gastrointestinal: Positive for nausea, abdominal pain and diarrhea.  Musculoskeletal: Positive for myalgias and joint pain.  Skin: Negative for rash.  Neurological: Positive for dizziness. Negative for tingling and headaches.  Psychiatric/Behavioral:       Anxiety   Past Medical History  Diagnosis Date  .  Hypertension   . History of carcinoma in situ of breast 1988  . Acute biliary pancreatitis 07/2002    thia was in 07/2004:she still has pseudocyst in tail of pancreas measuring 54 x 33 mm   . Upper GI bleed September2004    secondary to gastritis  . Diabetes mellitus   . Adrenal adenoma     bilateral  . Detached retina   . Chronic pancreatitis   . Hyperlipidemia   . Osteopenia   . IBS (irritable bowel syndrome)   . Legally blind   . Anxiety   . Osteoarthritis   . Diverticulosis    Past Surgical History  Procedure Laterality Date  . Colonoscopy  08/15/05    few tiny diverticula at sigmoid colon/external hemorrhoids but no polyps  . Right mastectomy    . Ectopic pregnancy surgery  1950's  . Tacking up of her bladder    . Laparoscopic cholecystectomy    . Ercp with sphincterotomy  07/2002  . Pancreatic pseudocyst drainage  June2004    drained percutaneously   . Esophagogastroduodenoscopy       Gastritis of body.  Otherwise normal  . Colonoscopy  01/08/2012    VAN:VBTYOMA diverticulosis. Next colonoscopy in 12/2016  . Esophagogastroduodenoscopy  01/08/2012    RMR: Few scattered gastric erosions of uncertain significance-status post biopsy. Minimal chronic inflammation, no H.pylori  . Cardiac catheterization N/A 08/24/2014    Procedure: Left Heart Cath and Coronary Angiography;  Surgeon: Leonie Man, MD;  Location: [facility];  Service: Cardiovascular;  Laterality: N/A;  . Coronary artery bypass graft N/A 08/24/2014    Procedure: CORONARY ARTERY BYPASS GRAFTING (CABG) x three, using left internal mammary artery and right leg greater saphenous  vein harvested endoscopically;  Surgeon: Ivin Poot, MD;  Location: [facility];  Service: Open Heart Surgery;  Laterality: N/A;  . Tee without cardioversion N/A 08/24/2014    Procedure: TRANSESOPHAGEAL ECHOCARDIOGRAM (TEE);  Surgeon: Ivin Poot, MD;  Location: [facility];  Service: Open Heart Surgery;  Laterality: N/A;   Family History    Problem Relation Age of Onset  . Colon cancer Father 64    passed away in his 76's  . Colon cancer Brother 68    surgery inhis 50's doing well now  . Heart attack Brother     passed away age 65  . Pancreatitis Brother   . Ovarian cancer Mother     passed  . Kidney disease Daughter   . Leukemia Son    Social History:  reports that she has never smoked. She does not have any smokeless tobacco history on file. She reports that she does not drink alcohol or use illicit drugs. Allergies:  Allergies  Allergen Reactions  . Calcium-Containing Compounds Nausea Only  . Raloxifene Itching    Face and eyes burning  . Vitamin D Analogs Nausea Only   Medications Prior to Admission  Medication Sig Dispense Refill  . acetaminophen (TYLENOL) 500 MG tablet Take 500-1,000 mg by mouth 2 (two) times daily as needed. Pain.    Marland Kitchen atenolol (TENORMIN) 25 MG tablet Take 1 tablet (25 mg total) by mouth daily. 90 tablet 4  . B-D INS SYRINGE 0.5CC/31GX5/16 31G X 5/16" 0.5 ML MISC USE AS DIRECTED 500 each 5  . Blood Glucose Monitoring Suppl (BLOOD GLUCOSE METER) kit Use as instructed 1 each 0  . calcium carbonate (TUMS EX) 750 MG chewable tablet Chew 1 tablet by mouth daily.    . Carbamide Peroxide (EAR DROPS OT) Place 2 drops in ear(s) daily as needed. Ear pain.    . cetirizine (ZYRTEC) 10 MG tablet TAKE ONE TABLET BY MOUTH ONCE DAILY 90 tablet 3  . CREON 24000 UNITS CPEP 2 w/ meals, 1 w/ snacks 720 capsule 3  . cycloSPORINE (RESTASIS) 0.05 % ophthalmic emulsion Place 1 drop into both eyes 3 (three) times daily.     . enalapril (VASOTEC) 20 MG tablet Take 1 tablet (20 mg total) by mouth daily. 90 tablet 3  . esomeprazole (NEXIUM) 40 MG capsule Take 1 capsule (40 mg total) by mouth daily before breakfast. 30 capsule 11  . glucose blood (ONE TOUCH ULTRA TEST) test strip Use to monitor FSBS 5x daily for fluctuating CBG. DX: E11.65 450 each 3  . hydrochlorothiazide (HYDRODIURIL) 25 MG tablet TAKE ONE TABLET BY  MOUTH ONCE DAILY 90 tablet 1  . hyoscyamine (LEVSIN SL) 0.125 MG SL tablet Place 1 tablet (0.125 mg total) under the tongue every 6 (six) hours as needed for cramping or diarrhea or loose stools. 360 tablet 1  . INS SYRINGE/NEEDLE 1CC/28G (MONOJECT ULTRA COMFORT SYRINGE) 28G X 1/2" 1 ML MISC Inject insulin BID 200 each 4  . LORazepam (ATIVAN) 0.5 MG tablet TAKE ONE TABLET BY MOUTH EVERY 8 HOURS AS NEEDED FOR ANXIETY OR AGITATION 30 tablet 0  . lovastatin (MEVACOR) 20 MG tablet Take 1 tablet (20 mg total) by mouth at bedtime. 30 tablet 5  . metFORMIN (GLUCOPHAGE-XR) 500 MG 24 hr tablet TAKE ONE TABLET BY MOUTH ONCE DAILY WITH BREAKFAST 90 tablet 3  . Multiple Vitamin (MULTIVITAMIN) capsule Take 1 capsule by mouth daily.    Marland Kitchen NOVOLIN N RELION 100 UNIT/ML injection INJECT 42 UNITS SUBCUTANEOUSLY IN  THE MORNING AND 4 UNITS IN THE EVENING 160 mL 3  . promethazine (PHENERGAN) 12.5 MG tablet Take 1 tablet (12.5 mg total) by mouth every 8 (eight) hours as needed for nausea or vomiting. 10 tablet 0  . zolpidem (AMBIEN) 10 MG tablet TAKE ONE TABLET BY MOUTH AT BEDTIME AS NEEDED FOR  INSOMNIA 30 tablet 0    Home: Home Living Family/patient expects to be discharged to:: Skilled nursing facility Living Arrangements: Spouse/significant other Additional Comments: pt lives with spouse who has alzheimers and is hi primary caregiver. Pt is legally blind and does not drive but was completely independent PTA.  Functional History: Prior Function Level of Independence: Independent Comments: did not use walker Functional Status:  Mobility: Bed Mobility Overal bed mobility: Needs Assistance Bed Mobility: Sit to Sidelying Sit to sidelying: Mod assist General bed mobility comments: Assist to bring feet up Transfers Overall transfer level: Needs assistance Equipment used: Pushed w/c Transfers: Sit to/from Stand Sit to Stand: Min assist, +2 physical assistance General transfer comment: Pt hugged heart pillow  one time and then placed hands on knees on second stand. Ambulation/Gait Ambulation/Gait assistance: Min assist Ambulation Distance (Feet): 250 Feet Assistive device:  (pushed WC) General Gait Details: Pt required 1 standing rest break. Gait Pattern/deviations: Step-through pattern, Decreased stride length Gait velocity: v/c's to slow down Gait velocity interpretation: at or above normal speed for age/gender    ADL:    Cognition: Cognition Overall Cognitive Status: Within Functional Limits for tasks assessed Orientation Level: Oriented X4 Cognition Arousal/Alertness: Awake/alert Behavior During Therapy: WFL for tasks assessed/performed Overall Cognitive Status: Within Functional Limits for tasks assessed  Blood pressure 164/92, pulse 97, temperature 97.8 F (36.6 C), temperature source Oral, resp. rate 18, height '5\' 3"'  (1.6 m), weight 64.4 kg (141 lb 15.6 oz), SpO2 95 %. Physical Exam  Constitutional: She is oriented to person, place, and time. She appears well-developed.  HENT:  Head: Normocephalic.  Eyes: EOM are normal.  Neck: Normal range of motion. Neck supple. No thyromegaly present.  Cardiovascular:  Cardiac rate controlled  Respiratory: Effort normal and breath sounds normal. No respiratory distress.  GI: Soft. Bowel sounds are normal. She exhibits no distension.  Neurological: She is alert and oriented to person, place, and time. She displays normal reflexes. Coordination normal.  Alert and appropriate. MMT: 4- UE prox to distal. LE--3-hf, 3 ke and adf/apf  Skin:  Midline chest incision clean and dry  Psychiatric: She has a normal mood and affect. Her behavior is normal.    Results for orders placed or performed during the hospital encounter of 08/24/14 (from the past 24 hour(s))  Glucose, capillary     Status: Abnormal   Collection Time: 08/29/14  7:12 AM  Result Value Ref Range   Glucose-Capillary 111 (H) 65 - 99 mg/dL   Comment 1 Capillary Specimen     Comment 2 Notify RN   Urinalysis, Routine w reflex microscopic (not at [facility])     Status: Abnormal   Collection Time: 08/29/14 10:30 AM  Result Value Ref Range   Color, Urine YELLOW YELLOW   APPearance CLEAR CLEAR   Specific Gravity, Urine 1.015 1.005 - 1.030   pH 7.0 5.0 - 8.0   Glucose, UA 100 (A) NEGATIVE mg/dL   Hgb urine dipstick NEGATIVE NEGATIVE   Bilirubin Urine NEGATIVE NEGATIVE   Ketones, ur 15 (A) NEGATIVE mg/dL   Protein, ur 30 (A) NEGATIVE mg/dL   Urobilinogen, UA 0.2 0.0 - 1.0 mg/dL   Nitrite  NEGATIVE NEGATIVE   Leukocytes, UA NEGATIVE NEGATIVE  Urine microscopic-add on     Status: Abnormal   Collection Time: 08/29/14 10:30 AM  Result Value Ref Range   Squamous Epithelial / LPF FEW (A) RARE   WBC, UA 0-2 <3 WBC/hpf  Glucose, capillary     Status: Abnormal   Collection Time: 08/29/14 11:17 AM  Result Value Ref Range   Glucose-Capillary 186 (H) 65 - 99 mg/dL   Comment 1 Capillary Specimen    Comment 2 Notify RN   Glucose, capillary     Status: Abnormal   Collection Time: 08/29/14  4:00 PM  Result Value Ref Range   Glucose-Capillary 257 (H) 65 - 99 mg/dL   Comment 1 Capillary Specimen    Comment 2 Notify RN   Glucose, capillary     Status: Abnormal   Collection Time: 08/29/14  8:49 PM  Result Value Ref Range   Glucose-Capillary 160 (H) 65 - 99 mg/dL   Dg Chest 2 View  08/29/2014   CLINICAL DATA:  Post CABG  EXAM: CHEST  2 VIEW  COMPARISON:  08/28/2014  FINDINGS: Cardiomediastinal silhouette is stable. The patient is status post CABG. Surgical clips in right axilla again noted. No acute infiltrate or pulmonary edema. Slight improvement in aeration. Right IJ central line has been removed. Small residual left pleural effusion with left basilar atelectasis. No segmental infiltrate.  Mild right basilar atelectasis.  IMPRESSION: Status post CABG. Small residual left pleural effusion with left basilar atelectasis. Mild right basilar atelectasis. No segmental infiltrate or  pulmonary edema.   Electronically Signed   By: Lahoma Crocker M.D.   On: 08/29/2014 07:55   Dg Chest Port 1 View  08/28/2014   CLINICAL DATA:  CABG.  EXAM: PORTABLE CHEST - 1 VIEW  COMPARISON:  08/27/2014.  FINDINGS: Right IJ sheath in stable position. Prior CABG. Mild cardiomegaly. Pulmonary venous congestion bilateral pulmonary interstitial prominence consistent congestive heart failure. Mild left base subsegmental atelectasis. Small left pleural effusions . Surgical clips right axilla.  IMPRESSION: 1.  Right IJ sheath in stable position .  2. Prior CABG. Congestive heart failure with mild bilateral pulmonary interstitial edema. Left base subsegmental atelectasis.   Electronically Signed   By: Marcello Moores  Register   On: 08/28/2014 07:34    Assessment/Plan: Diagnosis: debility after CABG, multiple medical 1. Does the need for close, 24 hr/day medical supervision in concert with the patient's rehab needs make it unreasonable for this patient to be served in a less intensive setting? Yes 2. Co-Morbidities requiring supervision/potential complications: dm, nstemi, 3. Due to bladder management, bowel management, safety, skin/wound care, disease management, medication administration and patient education, does the patient require 24 hr/day rehab nursing? No 4. Does the patient require coordinated care of a physician, rehab nurse, PT, OT to address physical and functional deficits in the context of the above medical diagnosis(es)? No Addressing deficits in the following areas: balance, endurance, locomotion, strength, transferring, bowel/bladder control, bathing, dressing, feeding, grooming and toileting 5. Can the patient actively participate in an intensive therapy program of at least 3 hrs of therapy per day at least 5 days per week? No 6. The potential for patient to make measurable gains while on inpatient rehab is fair 7. Anticipated functional outcomes upon discharge from inpatient rehab are n/a  with PT,  n/a with OT, n/a with SLP. 8. Estimated rehab length of stay to reach the above functional goals is: n/a 9. Does the patient have adequate social supports and  living environment to accommodate these discharge functional goals? N/A 10. Anticipated D/C setting: Home 11. Anticipated post D/C treatments: [place] therapy 12. Overall Rehab/Functional Prognosis: excellent  RECOMMENDATIONS: This patient's condition is appropriate for continued rehabilitative care in the following setting: SNF Patient has agreed to participate in recommended program. Yes Note that insurance prior authorization may be required for reimbursement for recommended care.  Comment: Pt already at min assist level. Husband has alzheimer's dementia. Pt wants to be home closer to family.   Meredith Staggers, MD, [place] 08/31/2014     08/30/2014

## 2014-08-31 ENCOUNTER — Telehealth: Payer: Self-pay | Admitting: Gastroenterology

## 2014-08-31 ENCOUNTER — Inpatient Hospital Stay
Admission: RE | Admit: 2014-08-31 | Discharge: 2014-10-12 | Disposition: A | Discharge status: Home / Self Care | Payer: Medicare HMO | Source: Ambulatory Visit | Attending: Internal Medicine | Admitting: Internal Medicine

## 2014-08-31 ENCOUNTER — Ambulatory Visit: Payer: Medicare HMO | Admitting: Gastroenterology

## 2014-08-31 ENCOUNTER — Inpatient Hospital Stay (HOSPITAL_COMMUNITY): Payer: Medicare HMO

## 2014-08-31 LAB — CBC
HCT: 32 % — ABNORMAL LOW (ref 36.0–46.0)
Hemoglobin: 10.5 g/dL — ABNORMAL LOW (ref 12.0–15.0)
MCH: 26.9 pg (ref 26.0–34.0)
MCHC: 32.8 g/dL (ref 30.0–36.0)
MCV: 81.8 fL (ref 78.0–100.0)
Platelets: 109 10*3/uL — ABNORMAL LOW (ref 150–400)
RBC: 3.91 MIL/uL (ref 3.87–5.11)
RDW: 14.7 % (ref 11.5–15.5)
WBC: 6 10*3/uL (ref 4.0–10.5)

## 2014-08-31 LAB — COMPREHENSIVE METABOLIC PANEL
ALT: 27 U/L (ref 14–54)
AST: 28 U/L (ref 15–41)
Albumin: 2.8 g/dL — ABNORMAL LOW (ref 3.5–5.0)
Alkaline Phosphatase: 39 U/L (ref 38–126)
Anion gap: 12 (ref 5–15)
BUN: 20 mg/dL (ref 6–20)
CO2: 31 mmol/L (ref 22–32)
Calcium: 8.2 mg/dL — ABNORMAL LOW (ref 8.9–10.3)
Chloride: 87 mmol/L — ABNORMAL LOW (ref 101–111)
Creatinine, Ser: 0.79 mg/dL (ref 0.44–1.00)
GFR calc Af Amer: >60 mL/min (ref >60)
GFR calc non Af Amer: >60 mL/min (ref >60)
Glucose, Bld: 210 mg/dL — ABNORMAL HIGH (ref 65–99)
Potassium: 3.7 mmol/L (ref 3.5–5.1)
Sodium: 130 mmol/L — ABNORMAL LOW (ref 135–145)
Total Bilirubin: 1 mg/dL (ref 0.3–1.2)
Total Protein: 5.7 g/dL — ABNORMAL LOW (ref 6.5–8.1)

## 2014-08-31 LAB — GLUCOSE, CAPILLARY
Glucose-Capillary: 174 mg/dL — ABNORMAL HIGH (ref 65–99)
Glucose-Capillary: 269 mg/dL — ABNORMAL HIGH (ref 65–99)

## 2014-08-31 MED ORDER — HYDRALAZINE HCL 10 MG PO TABS
10.0000 mg | ORAL_TABLET | Freq: Three times a day (TID) | ORAL | Status: DC
  Administered 2014-08-31: 10 mg via ORAL

## 2014-08-31 MED ORDER — PROMETHAZINE HCL 12.5 MG PO TABS: 6.2500 mg | ORAL_TABLET | Freq: Four times a day (QID) | ORAL | Status: DC | PRN

## 2014-08-31 MED ORDER — METOPROLOL TARTRATE 50 MG PO TABS: 50.0000 mg | ORAL_TABLET | Freq: Two times a day (BID) | ORAL | Status: DC

## 2014-08-31 MED ORDER — POTASSIUM CHLORIDE CRYS ER 20 MEQ PO TBCR
40.0000 meq | EXTENDED_RELEASE_TABLET | Freq: Once | ORAL | Status: AC
  Administered 2014-08-31: 40 meq via ORAL
  Filled 2014-08-31: qty 2

## 2014-08-31 MED ORDER — POTASSIUM CHLORIDE CRYS ER 20 MEQ PO TBCR
20.0000 meq | EXTENDED_RELEASE_TABLET | Freq: Once | ORAL | Status: AC
  Administered 2014-08-31: 20 meq via ORAL
  Filled 2014-08-31: qty 1

## 2014-08-31 MED ORDER — ENSURE ENLIVE PO LIQD: 237.0000 mL | Freq: Two times a day (BID) | ORAL | Status: DC

## 2014-08-31 MED ORDER — ENALAPRIL MALEATE 20 MG PO TABS
20.0000 mg | ORAL_TABLET | Freq: Every day | ORAL | Status: DC
  Administered 2014-08-31: 20 mg via ORAL
  Filled 2014-08-31: qty 1

## 2014-08-31 MED ORDER — ASPIRIN 81 MG PO TBEC: 81.0000 mg | DELAYED_RELEASE_TABLET | Freq: Every day | ORAL | Status: DC

## 2014-08-31 MED ORDER — OXYCODONE HCL 5 MG PO TABS
5.0000 mg | ORAL_TABLET | Freq: Four times a day (QID) | ORAL | Status: DC | PRN
Start: 2014-08-31 — End: 2014-10-02

## 2014-08-31 MED ORDER — ROSUVASTATIN CALCIUM 40 MG PO TABS: 40.0000 mg | ORAL_TABLET | Freq: Every day | ORAL | Status: DC

## 2014-08-31 MED ORDER — HYDRALAZINE HCL 10 MG PO TABS: 10.0000 mg | ORAL_TABLET | Freq: Three times a day (TID) | ORAL | Status: DC

## 2014-08-31 MED ORDER — INSULIN ASPART 100 UNIT/ML ~~LOC~~ SOLN: 0.0000 [IU] | Freq: Three times a day (TID) | SUBCUTANEOUS | Status: DC

## 2014-08-31 MED ORDER — INSULIN NPH (HUMAN) (ISOPHANE) 100 UNIT/ML ~~LOC~~ SUSP: 20.0000 [IU] | Freq: Every day | SUBCUTANEOUS | Status: DC

## 2014-08-31 MED ORDER — FUROSEMIDE 40 MG PO TABS
40.0000 mg | ORAL_TABLET | Freq: Once | ORAL | Status: AC
  Administered 2014-08-31: 40 mg via ORAL
  Filled 2014-08-31: qty 1

## 2014-08-31 NOTE — Progress Notes (Signed)
Patient will discharge to [facility] Anticipated discharge date:08/31/14 Transportation by [facility]- scheduled for 3pm  CSW signing off.  Domenica Reamer, [place] Social Worker [phone] ]

## 2014-08-31 NOTE — Discharge Summary (Signed)
Physician Discharge Summary  Patient ID: Misty Edwards MRN: [id number] DOB/AGE: 79/03/1926 79 y.o.  Admit date: 08/24/2014 Discharge date: 08/31/2014  Admission Diagnoses: NSTEMI  Discharge Diagnoses:  Principal Problem:   NSTEMI (non-ST elevated myocardial infarction) Active Problems:   Diabetes mellitus type 2, insulin dependent   Acute coronary syndrome   S/P CABG x 3  Patient Active Problem List   Diagnosis Date Noted  . S/P CABG x 3 08/25/2014  . NSTEMI (non-ST elevated myocardial infarction) 08/24/2014  . Acute coronary syndrome   . IBS (irritable bowel syndrome) 08/26/2012  . Thrombocytopenia 02/02/2012  . Epigastric pain 02/02/2012  . Gastritis 02/02/2012  . Abdominal pain 12/17/2011  . Normocytic anemia 12/17/2011  . History of colon polyps 12/17/2011  . Constipation 12/17/2011  . Family history of colon cancer 08/22/2011  . LLQ pain 08/21/2011  . CHRONIC PANCREATITIS 09/12/2008  . HELICOBACTER PYLORI INFECTION 01/26/2008  . ADENOCARCINOMA, BREAST 01/26/2008  . Diabetes mellitus type 2, insulin dependent 01/26/2008  . Essential hypertension 01/26/2008  . EXTERNAL HEMORRHOIDS 01/26/2008  . GERD 01/26/2008  . CONSTIPATION 01/26/2008  . PSEUDOCYST, PANCREAS 01/26/2008  . GALLSTONE PANCREATITIS 01/26/2008  . GI BLEEDING 01/26/2008  . UTI 01/26/2008  . NAUSEA 01/26/2008  . VOMITING 01/26/2008  . DIARRHEA 01/26/2008    HPI: AT TIME OF ADMISSION   Misty Edwards is a 81 y.o. female with no history of CAD. She has a history of hypertension, upper GI bleed secondary to gastritis, diabetes, chronic pancreatitis with pseudocyst and IBS.  For the last 7-9 days, she has had intermittent chest pain. She has been getting episodes consistently with exertion, and sometimes at rest. She she noticed increasing dyspnea on exertion and her activity was significantly limited by this. She had some nausea but associated this with low blood sugars. She was not diaphoretic. She  has not had any recent black tarry stools or bleeding issues. She denies orthopnea, PND or lower extremity edema.  She went to see Dr. Dennard Schaumann today and was told her ECG was newly abnormal. She was sent to the emergency room where she was still having some chest discomfort. Her ECG showed diffuse ST depression in the inferior and lateral leads. She was treated medically with aspirin, heparin bolus, IV Lopressor, morphine and IV nitroglycerin. She was transferred to [facility]. Her ECG was still abnormal but her pain improved. Because her ECG was still abnormal she was taken directly to the cath lab. Discharged Condition: good  Hospital Course: At cardiac catheterization patient was found to have severe multivessel coronary artery disease. She was seen in prompt cardiothoracic surgical consultation who felt she would require high-risk emergent surgical revascularization. She was taken the operating room and underwent the following procedure:  DATE OF PROCEDURE: 08/24/2014 DATE OF DISCHARGE:   OPERATIVE REPORT   OPERATIONS: 1. Emergency coronary artery bypass grafting x3 (left internal mammary  artery to LAD, saphenous vein graft to obtuse marginal, saphenous  vein graft to posterior descending). 2. Endoscopic harvest of right leg greater saphenous vein.  PREOPERATIVE DIAGNOSES: Acute myocardial infarction, severe left main and three-vessel coronary artery disease, acute systolic heart failure.  POSTOPERATIVE DIAGNOSES: Acute myocardial infarction, severe left main and three-vessel coronary artery disease, acute systolic heart failure.  SURGEON: Ivin Poot, M.D.  ASSISTANT: John Giovanni, PA-C.  ANESTHESIA: General by Dr. Myrtie Soman. She tolerated the procedure well was taken to the surgical intensive care unit in stable condition  Post operative Hospital course:  The patient has  overall done quite well. She has maintained stable  hemodynamics. She was extubated without difficulty using the standard postoperative protocols. She has remained neurologically intact. She did have a postoperative thrombocytopenia and postoperative heparin use has been avoided. Her values have improved over time. He has an expected acute blood loss anemia which is also stabilized. Blood sugars are under adequate control using standard postoperative protocols. She has some postoperative volume overload which has responded well to diuretics. She has had no significant postoperative cardiac dysrhythmias. She has had some mild issues with nausea which is apparently a chronic issue. She has been seen in physical therapy consultation and it is felt she would benefit from ongoing rehabilitation in a short-term nursing facility and is felt to be stable for transfer on today's date.  Consults: None  Significant Diagnostic Studies: angiography: cardiac cath   Treatments: surgery: as described above   Discharge Exam: Blood pressure 172/63, pulse 103, temperature 97.6 F (36.4 C), temperature source Oral, resp. rate 18, height '5\' 3"'  (1.6 m), weight 141 lb 12.1 oz (64.3 kg), SpO2 94 %.   Cardiovascular: Slightly tachy Pulmonary: Diminished at bases bilaterally; no rales, wheezes, or rhonchi. Abdomen: Soft, non tender, bowel sounds present. Extremities: Mild bilateral lower extremity edema. Wounds: Clean and dry. No erythema or signs of infection Disposition: 06-Home-Health Care Svc  Medications at time of discharge:   Medication List    STOP taking these medications        atenolol 25 MG tablet  Commonly known as:  TENORMIN     B-D INS SYRINGE 0.5CC/31GX5/16 31G X 5/16" 0.5 ML Misc  Generic drug:  Insulin Syringe-Needle U-100     blood glucose meter kit and supplies     calcium carbonate 750 MG chewable tablet  Commonly known as:  TUMS EX     EAR DROPS OT     glucose blood test strip  Commonly known as:  ONE TOUCH ULTRA TEST      hydrochlorothiazide 25 MG tablet  Commonly known as:  HYDRODIURIL     hyoscyamine 0.125 MG SL tablet  Commonly known as:  LEVSIN SL     INS SYRINGE/NEEDLE 1CC/28G 28G X 1/2" 1 ML Misc  Commonly known as:  MONOJECT ULTRA COMFORT SYRINGE     lovastatin 20 MG tablet  Commonly known as:  MEVACOR  Replaced by:  rosuvastatin 40 MG tablet     zolpidem 10 MG tablet  Commonly known as:  AMBIEN      TAKE these medications        acetaminophen 500 MG tablet  Commonly known as:  TYLENOL  Take 500-1,000 mg by mouth 2 (two) times daily as needed. Pain.     aspirin 81 MG EC tablet  Take 1 tablet (81 mg total) by mouth daily.     cetirizine 10 MG tablet  Commonly known as:  ZYRTEC  TAKE ONE TABLET BY MOUTH ONCE DAILY     CREON 24000 UNITS Cpep  Generic drug:  Pancrelipase (Lip-Prot-Amyl)  2 w/ meals, 1 w/ snacks     cycloSPORINE 0.05 % ophthalmic emulsion  Commonly known as:  RESTASIS  Place 1 drop into both eyes 3 (three) times daily.     enalapril 20 MG tablet  Commonly known as:  VASOTEC  Take 1 tablet (20 mg total) by mouth daily.     esomeprazole 40 MG capsule  Commonly known as:  NEXIUM  Take 1 capsule (40 mg total) by mouth daily before breakfast.  feeding supplement (ENSURE ENLIVE) Liqd  Take 237 mLs by mouth 2 (two) times daily between meals.     hydrALAZINE 10 MG tablet  Commonly known as:  APRESOLINE  Take 1 tablet (10 mg total) by mouth every 8 (eight) hours.     insulin aspart 100 UNIT/ML injection  Commonly known as:  novoLOG  Inject 0-24 Units into the skin 4 (four) times daily -  with meals and at bedtime.     insulin NPH Human 100 UNIT/ML injection  Commonly known as:  NOVOLIN N RELION  Inject 0.2 mLs (20 Units total) into the skin daily before breakfast.     LORazepam 0.5 MG tablet  Commonly known as:  ATIVAN  TAKE ONE TABLET BY MOUTH EVERY 8 HOURS AS NEEDED FOR ANXIETY OR AGITATION     metFORMIN 500 MG 24 hr tablet  Commonly known as:   GLUCOPHAGE-XR  TAKE ONE TABLET BY MOUTH ONCE DAILY WITH BREAKFAST     metoprolol 50 MG tablet  Commonly known as:  LOPRESSOR  Take 1 tablet (50 mg total) by mouth 2 (two) times daily.     multivitamin capsule  Take 1 capsule by mouth daily.     oxyCODONE 5 MG immediate release tablet  Commonly known as:  Oxy IR/ROXICODONE  Take 1 tablet (5 mg total) by mouth every 6 (six) hours as needed for severe pain.     promethazine 12.5 MG tablet  Commonly known as:  PHENERGAN  Take 0.5-1 tablets (6.25-12.5 mg total) by mouth every 6 (six) hours as needed for nausea or vomiting.     rosuvastatin 40 MG tablet  Commonly known as:  CRESTOR  Take 1 tablet (40 mg total) by mouth daily at 6 PM.            Follow-up Information    Follow up with Pixie Casino, MD.   Specialty:  Cardiology   Why:  cardiology follow-up   Contact information:   [address] [place] [address] [phone]       Follow up with Len Childs, MD.   Specialty:  Cardiothoracic Surgery   Why:  09/27/2014 at 2:30 PM to see the surgeon. Please obtain a chest x-ray at [facility] at 2 PM. [facility] imaging is located in the same office complex.   Contact information:   [address] [place] [address] [phone]      The patient has been discharged on:   1.Beta Blocker:  Yes [ y  ]                              No   [   ]                              If No, reason:  2.Ace Inhibitor/ARB: Yes [  y ]                                     No  [    ]                                     If No, reason:  3.Statin:   Yes [  y ]                  No  [   ]                  If No, reason:  4.Ecasa:  Yes  [ y  ]                  No   [   ]                  If No, reason:   SNF instructions:  1. Please obtain vital signs at least one time daily 2.Please weigh the patient daily. If he or she continues to gain weight or develops lower extremity edema, contact  the office at [phone]. 3. Ambulate patient at least three times daily and please use sternal precautions.    SignedJadene Pierini E 08/31/2014, 10:19 AM

## 2014-08-31 NOTE — Care Management Note (Addendum)
Case Management Note  Patient Details  Name: Misty Edwards MRN: [id number] Date of Birth: May 20, 1926  Subjective/Objective:   Pt admitted with NSTEMI                 Action/Plan:  Pt will discharge to SNF, SW consulted   Expected Discharge Date:                  Expected Discharge Plan:  [place]  In-House Referral:  Clinical Social Work  Discharge planning Services  CM Consult  Post Acute Care Choice:    Choice offered to:     DME Arranged:    DME Agency:     HH Arranged:    [place] Agency:     Status of Service:  Complete, will sign off  Medicare Important Message Given:  Yes Date Medicare IM Given:  08/30/14 Medicare IM give by:  Marvetta Gibbons RN, BSN  Date Additional Medicare IM Given:  08/31/14 Additional Medicare Important Message give by:  Elenor Quinones  If discussed at Long Length of Stay Meetings, dates discussed:    Disposition Plan: SNF  Additional Comments: 08/31/15 Elenor Quinones, RN, BSN [phone] Pt will discharge to [facility] 08/31/14.    Maryclare Labrador, RN 08/31/2014, 12:30 PM

## 2014-08-31 NOTE — Telephone Encounter (Signed)
Pt was a no show

## 2014-08-31 NOTE — Progress Notes (Signed)
RN made [place] aware of this CABG pt, we never received order when she transferred to 2W. Pt has been moving independently in room and walking with PT. On bedrest now and planning to d/c later today. Discussed ed with pt who voiced understanding. Pt is interested in CRPII at [facility] and will send referral there. Gave diet and ex gl for daughter to read (since pt can't see to read).  [id number] Yves Dill CES, ACSM 11:05 AM 08/31/2014

## 2014-08-31 NOTE — Progress Notes (Signed)
Noted pt plans for d/c today and pt requests to be closer to home. SNF is recommended. [phone]

## 2014-08-31 NOTE — Progress Notes (Signed)
      [address]       [place] [address]             [phone]        7 Days Post-Op Procedure(s) (LRB): CORONARY ARTERY BYPASS GRAFTING (CABG) x three, using left internal mammary artery and right leg greater saphenous vein harvested endoscopically (N/A) TRANSESOPHAGEAL ECHOCARDIOGRAM (TEE) (N/A)  Subjective: Patient without complaints.  Objective: Vital signs in last 24 hours: Temp:  [97.6 F (36.4 C)-98.3 F (36.8 C)] 97.6 F (36.4 C) (06/16 0500) Pulse Rate:  [91-116] 94 (06/16 0500) Cardiac Rhythm:  [-] Normal sinus rhythm (06/16 0748) Resp:  [17-20] 18 (06/16 0500) BP: (127-165)/(60-78) 154/78 mmHg (06/16 0500) SpO2:  [94 %-96 %] 94 % (06/16 0500) Weight:  [141 lb 12.1 oz (64.3 kg)] 141 lb 12.1 oz (64.3 kg) (06/16 0500)  Pre op weight 63 kg Current Weight  08/31/14 141 lb 12.1 oz (64.3 kg)    Intake/Output from previous day: 06/15 0701 - 06/16 0700 In: 600 [P.O.:600] Out: -    Physical Exam:  Cardiovascular: Slightly tachy Pulmonary: Diminished at bases bilaterally; no rales, wheezes, or rhonchi. Abdomen: Soft, non tender, bowel sounds present. Extremities: Mild bilateral lower extremity edema. Wounds: Clean and dry.  No erythema or signs of infection.  Lab Results: CBC: Recent Labs  08/29/14 0249 08/31/14 0403  WBC 8.1 6.0  HGB 11.4* 10.5*  HCT 34.4* 32.0*  PLT 108* 109*   BMET:  Recent Labs  08/29/14 0249 08/31/14 0403  NA 132* 130*  K 3.4* 3.7  CL 87* 87*  CO2 34* 31  GLUCOSE 93 210*  BUN 14 20  CREATININE 0.66 0.79  CALCIUM 8.7* 8.2*    PT/INR:  Lab Results  Component Value Date   INR 1.46 08/25/2014   INR 1.05 08/24/2014   ABG:  INR: Will add last result for INR, ABG once components are confirmed Will add last 4 CBG results once components are confirmed  Assessment/Plan:  1. CV - S/p NSTEMI. Hypertensive. On Lopressor 50 mg bid, Enalapril 5 mg daily, and Hydralazine 10 mg QID. Was on Enlapril 20  prior to surgery. Will increase for better BP control. Will decrease Hydralazine to 10 mg tid and monitor BP. 2.  Pulmonary - On room air. CXR appears to show small left pleural effusion, bibasilar atelectasis, no pneumothorax. Encourage incentive spirometer. 3. Volume Overload - Give Lasix 40 mg today 4.  Acute blood loss anemia - H and H 10.5 and 32. 5. Mild thrombocytopenia-platelets stable at 109,000. 6. Supplement potassium 7. DM-CBGs 236/105/174. On Metformin 500 mg daily as taken pre op. No HGA1C.  8. Remove EPW 9. Will need SNF/CIR when ready for discharge  ZIMMERMAN,DONIELLE MPA-C 08/31/2014,7:51 AM

## 2014-08-31 NOTE — Progress Notes (Signed)
Report called to [facility] at [facility]  Pt discharged with EMS Discharge instructions given to transport Cardiac book given to daughter Education discussed  IV dc'd  Tele dc'd  Pt discharged via stretcher with transport All patient belongings at sent with daughter    Misty Edwards 3:06 PM

## 2014-08-31 NOTE — Discharge Instructions (Signed)
Endoscopic Saphenous Vein Harvesting °Care After °Refer to this sheet in the next few weeks. These instructions provide you with information on caring for yourself after your procedure. Your health care provider may also give you more specific instructions. Your treatment has been planned according to current medical practices, but problems sometimes occur. Call your health care provider if you have any problems or questions after your procedure. °HOME CARE INSTRUCTIONS °Medicine °· Take whatever pain medicine your surgeon prescribes. Follow the directions carefully. Do not take over-the-counter pain medicine unless your surgeon says it is okay. Some pain medicine can cause bleeding problems for several weeks after surgery. °· Follow your surgeon's instructions about driving. You will probably not be permitted to drive after heart surgery. °· Take any medicines your surgeon prescribes. Any medicines you took before your heart surgery should be checked with your health care provider before you start taking them again. °Wound care °· If your surgeon has prescribed an elastic bandage or stocking, ask how long you should wear it. °· Check the area around your surgical cuts (incisions) whenever your bandages (dressings) are changed. Look for any redness or swelling. °· You will need to return to have the stitches (sutures) or staples taken out. Ask your surgeon when to do that. °· Ask your surgeon when you can shower or bathe. °Activity °· Try to keep your legs raised when you are sitting. °· Do any exercises your health care providers have given you. These may include deep breathing exercises, coughing, walking, or other exercises. °SEEK MEDICAL CARE IF: °· You have any questions about your medicines. °· You have more leg pain, especially if your pain medicine stops working. °· New or growing bruises develop on your leg. °· Your leg swells, feels tight, or becomes red. °· You have numbness in your leg. °SEEK IMMEDIATE  MEDICAL CARE IF: °· Your pain gets much worse. °· Blood or fluid leaks from any of the incisions. °· Your incisions become warm, swollen, or red. °· You have chest pain. °· You have trouble breathing. °· You have a fever. °· You have more pain near your leg incision. °MAKE SURE YOU: °· Understand these instructions. °· Will watch your condition. °· Will get help right away if you are not doing well or get worse. °Document Released: 11/13/2010 Document Revised: 03/08/2013 Document Reviewed: 11/13/2010 °ExitCare® Patient Information ©2015 [employer]. This information is not intended to replace advice given to you by your health care provider. Make sure you discuss any questions you have with your health care provider. °Coronary Artery Bypass Grafting, Care After °These instructions give you information on caring for yourself after your procedure. Your doctor may also give you more specific instructions. Call your doctor if you have any problems or questions after your procedure.  °HOME CARE °· Only take medicine as told by your doctor. Take medicines exactly as told. Do not stop taking medicines or start any new medicines without talking to your doctor first. °· Take your pulse as told by your doctor. °· Do deep breathing as told by your doctor. Use your breathing device (incentive spirometer), if given, to practice deep breathing several times a day. Support your chest with a pillow or your arms when you take deep breaths or cough. °· Keep the area clean, dry, and protected where the surgery cuts (incisions) were made. Remove bandages (dressings) only as told by your doctor. If strips were applied to surgical area, do not take them off. They fall off   on their own. °· Check the surgery area daily for puffiness (swelling), redness, or leaking fluid. °· If surgery cuts were made in your legs: °· Avoid crossing your legs. °· Avoid sitting for long periods of time. Change positions every 30 minutes. °· Raise your legs  when you are sitting. Place them on pillows. °· Wear stockings that help keep blood clots from forming in your legs (compression stockings). °· Only take sponge baths until your doctor says it is okay to take showers. Pat the surgery area dry. Do not rub the surgery area with a washcloth or towel. Do not bathe, swim, or use a hot tub until your doctor says it is okay. °· Eat foods that are high in fiber. These include raw fruits and vegetables, whole grains, beans, and nuts. Choose lean meats. Avoid canned, processed, and fried foods. °· Drink enough fluids to keep your pee (urine) clear or pale yellow. °· Weigh yourself every day. °· Rest and limit activity as told by your doctor. You may be told to: °· Stop any activity if you have chest pain, shortness of breath, changes in heartbeat, or dizziness. Get help right away if this happens. °· Move around often for short amounts of time or take short walks as told by your doctor. Gradually become more active. You may need help to strengthen your muscles and build endurance. °· Avoid lifting, pushing, or pulling anything heavier than 10 pounds (4.5 kg) for at least 6 weeks after surgery. °· Do not drive until your doctor says it is okay. °· Ask your doctor when you can go back to work. °· Ask your doctor when you can begin sexual activity again. °· Follow up with your doctor as told. °GET HELP IF: °· You have puffiness, redness, more pain, or fluid draining from the incision site. °· You have a fever. °· You have puffiness in your ankles or legs. °· You have pain in your legs. °· You gain 2 or more pounds (0.9 kg) a day. °· You feel sick to your stomach (nauseous) or throw up (vomit). °· You have watery poop (diarrhea). °GET HELP RIGHT AWAY IF: °· You have chest pain that goes to your jaw or arms. °· You have shortness of breath. °· You have a fast or irregular heartbeat. °· You notice a "clicking" in your breastbone when you move. °· You have numbness or weakness in  your arms or legs. °· You feel dizzy or light-headed. °MAKE SURE YOU: °· Understand these instructions. °· Will watch your condition. °· Will get help right away if you are not doing well or get worse. °Document Released: 03/08/2013 Document Reviewed: 03/08/2013 °ExitCare® Patient Information ©2015 [employer]. This information is not intended to replace advice given to you by your health care provider. Make sure you discuss any questions you have with your health care provider. ° °

## 2014-08-31 NOTE — Telephone Encounter (Signed)
Reminder in epic for patient to follow up in 3 months

## 2014-08-31 NOTE — Telephone Encounter (Signed)
Just D/C to short-term nursing facility from hospital today after NSTEMI and CABG X 3. Please NIC for OV here for routine follow up in 3 months.

## 2014-08-31 NOTE — Clinical Social Work Placement (Signed)
   CLINICAL SOCIAL WORK PLACEMENT  NOTE  Date:  08/31/2014  Patient Details  Name: Misty Edwards MRN: [id number] Date of Birth: [birth year]  Clinical Social Work is seeking post-discharge placement for this patient at the [place] level of care (*CSW will initial, date and re-position this form in  chart as items are completed):  Yes   Patient/family provided with [place] Work Department's list of facilities offering this level of care within the geographic area requested by the patient (or if unable, by the patient's family).  Yes   Patient/family informed of their freedom to choose among providers that offer the needed level of care, that participate in Medicare, Medicaid or managed care program needed by the patient, have an available bed and are willing to accept the patient.  Yes   Patient/family informed of [facility]'s ownership interest in [facility] and [facility], as well as of the fact that they are under no obligation to receive care at these facilities.  PASRR submitted to EDS on 08/27/14     PASRR number received on 08/27/14     Existing PASRR number confirmed on       FL2 transmitted to all facilities in geographic area requested by pt/family on 08/27/14     FL2 transmitted to all facilities within larger geographic area on 08/27/14     Patient informed that his/her managed care company has contracts with or will negotiate with certain facilities, including the following:        Yes   Patient/family informed of bed offers received.  Patient chooses bed at [facility]     Physician recommends and patient chooses bed at      Patient to be transferred to [facility] on 08/31/14.  Patient to be transferred to facility by ptar     Patient family notified on 08/31/14 of transfer.  Name of family member notified:        PHYSICIAN Please sign FL2     Additional Comment:     _______________________________________________ Cranford Mon, LCSW 08/31/2014, 12:27 PM

## 2014-09-01 ENCOUNTER — Encounter (HOSPITAL_COMMUNITY)
Admission: AD | Admit: 2014-09-01 | Discharge: 2014-09-01 | Disposition: A | Discharge status: Home / Self Care | Payer: Medicare HMO | Source: Skilled Nursing Facility | Attending: Internal Medicine | Admitting: Internal Medicine

## 2014-09-01 ENCOUNTER — Non-Acute Institutional Stay (SKILLED_NURSING_FACILITY): Payer: Medicare HMO | Admitting: Internal Medicine

## 2014-09-01 ENCOUNTER — Other Ambulatory Visit (HOSPITAL_COMMUNITY)
Admission: AD | Admit: 2014-09-01 | Discharge: 2014-09-01 | Disposition: A | Discharge status: Home / Self Care | Payer: Medicare HMO | Source: Skilled Nursing Facility | Attending: Internal Medicine | Admitting: Internal Medicine

## 2014-09-01 DIAGNOSIS — I10 Essential (primary) hypertension: Secondary | ICD-10-CM | POA: Insufficient documentation

## 2014-09-01 DIAGNOSIS — I214 Non-ST elevation (NSTEMI) myocardial infarction: Secondary | ICD-10-CM

## 2014-09-01 DIAGNOSIS — D696 Thrombocytopenia, unspecified: Secondary | ICD-10-CM

## 2014-09-01 DIAGNOSIS — Z951 Presence of aortocoronary bypass graft: Secondary | ICD-10-CM

## 2014-09-01 DIAGNOSIS — K861 Other chronic pancreatitis: Secondary | ICD-10-CM | POA: Diagnosis not present

## 2014-09-01 LAB — COMPREHENSIVE METABOLIC PANEL
ALBUMIN: 3.2 g/dL — AB (ref 3.5–5.0)
ALT: 32 U/L (ref 14–54)
AST: 28 U/L (ref 15–41)
Alkaline Phosphatase: 40 U/L (ref 38–126)
Anion gap: 10 (ref 5–15)
BUN: 23 mg/dL — ABNORMAL HIGH (ref 6–20)
CO2: 31 mmol/L (ref 22–32)
Calcium: 8.6 mg/dL — ABNORMAL LOW (ref 8.9–10.3)
Chloride: 91 mmol/L — ABNORMAL LOW (ref 101–111)
Creatinine, Ser: 0.76 mg/dL (ref 0.44–1.00)
GFR calc Af Amer: >60 mL/min (ref >60)
GFR calc non Af Amer: >60 mL/min (ref >60)
Glucose, Bld: 163 mg/dL — ABNORMAL HIGH (ref 65–99)
POTASSIUM: 4 mmol/L (ref 3.5–5.1)
Sodium: 132 mmol/L — ABNORMAL LOW (ref 135–145)
TOTAL PROTEIN: 6.3 g/dL — AB (ref 6.5–8.1)
Total Bilirubin: 0.9 mg/dL (ref 0.3–1.2)

## 2014-09-01 NOTE — Progress Notes (Signed)
Patient ID: Misty Edwards, female   DOB: 11-04-1926, 81 y.o.   MRN: [id number]   This is an acute visit  Level care skilled.  Facility [facility].  Chief complaint-acute visit status post hospitalization for non-ST elevated MI. Status post CABG 3  History of present illness.  Patient is a very pleasant 81 year old female with history coronary artery disease and hypertension.  She was having intermittent chest pain over the period of a week with exertion and at rest.  She also had increase shortness of breath.  She went to see her primary care provider and apparently had an abnormal EKG-she was sent to the ER with some chest discomfort EKG showed diffuse ST depression in the inferior and lateral leads-she was treated medically with aspirin IV Lopressor morphine and IV nitroglycerin.  She was transferred to [facility] taken to the Cath Lab-she was found to have severe multivessel ordinary artery disease-she had emergent surgical revascularization.  Postop course was relatively uncomplicated.  She did have postop thrombocytopenia and postop heparin use was avoided-her platelet count did improve.  Her blood sugars were stable she does have a history of diabetes type 2.  She did require postop diarrhetic's because of some mild volume overload.  She is here for rehabilitation.   currently she has no complaints does not complain of shortness of breath or chest pain vital signs are stable blood pressure is somewhat elevated at times systolics in the [id number] will have to be monitored area   Past Medical History  Diagnosis Date  . Hypertension   . History of carcinoma in situ of breast 1988  . Acute biliary pancreatitis 07/2002    thia was in 07/2004:she still has pseudocyst in tail of pancreas measuring 54 x 33 mm   . Upper GI bleed September2004    secondary to gastritis  . Diabetes mellitus   . Adrenal adenoma     bilateral  . Detached  retina   . Chronic pancreatitis   . Hyperlipidemia   . Osteopenia   . IBS (irritable bowel syndrome)   . Legally blind   . Anxiety   . Osteoarthritis   . Diverticulosis     Past Surgical History  Procedure Laterality Date  . Colonoscopy  08/15/05    few tiny diverticula at sigmoid colon/external hemorrhoids but no polyps  . Right mastectomy    . Ectopic pregnancy surgery  1950's  . Tacking up of her bladder    . Laparoscopic cholecystectomy    . Ercp with sphincterotomy  07/2002  . Pancreatic pseudocyst drainage  June2004    drained percutaneously   . Esophagogastroduodenoscopy      Gastritis of body. Otherwise normal  . Colonoscopy  01/08/2012    XBL:TJQZESP diverticulosis. Next colonoscopy in 12/2016  . Esophagogastroduodenoscopy  01/08/2012    RMR: Few scattered gastric erosions of uncertain significance-status post biopsy. Minimal chronic inflammation, no H.pylori  . Cardiac catheterization N/A 08/24/2014    Procedure: Left Heart Cath and Coronary Angiography; Surgeon: Leonie Man, MD; Location: [facility]; Service: Cardiovascular; Laterality: N/A;  . Coronary artery bypass graft N/A 08/24/2014    Procedure: CORONARY ARTERY BYPASS GRAFTING (CABG) x three, using left internal mammary artery and right leg greater saphenous vein harvested endoscopically; Surgeon: Ivin Poot, MD; Location: [facility]; Service: Open Heart Surgery; Laterality: N/A;  . Tee without cardioversion N/A 08/24/2014    Procedure: TRANSESOPHAGEAL ECHOCARDIOGRAM (TEE); Surgeon: Ivin Poot, MD; Location: [facility]; Service: Open Heart  Surgery; Laterality: N/A;    Current Outpatient Prescriptions on File Prior to Visit  Medication Sig Dispense Refill  . acetaminophen (TYLENOL) 500 MG tablet Take 500-1,000 mg by mouth 2 (two) times daily as needed. Pain.    Marland Kitchen aspirin EC 81  MG EC tablet Take 1 tablet (81 mg total) by mouth daily.    . cetirizine (ZYRTEC) 10 MG tablet TAKE ONE TABLET BY MOUTH ONCE DAILY 90 tablet 3  . CREON 24000 UNITS CPEP 2 w/ meals, 1 w/ snacks 720 capsule 3  . cycloSPORINE (RESTASIS) 0.05 % ophthalmic emulsion Place 1 drop into both eyes 3 (three) times daily.     . enalapril (VASOTEC) 20 MG tablet Take 1 tablet (20 mg total) by mouth daily. 90 tablet 3  . esomeprazole (NEXIUM) 40 MG capsule Take 1 capsule (40 mg total) by mouth daily before breakfast. 30 capsule 11  . feeding supplement, ENSURE ENLIVE, (ENSURE ENLIVE) LIQD Take 237 mLs by mouth 2 (two) times daily between meals. 237 mL 12  . hydrALAZINE (APRESOLINE) 10 MG tablet Take 1 tablet (10 mg total) by mouth every 8 (eight) hours.    . insulin aspart (NOVOLOG) 100 UNIT/ML injection Inject 0-24 Units into the skin 4 (four) times daily - with meals and at bedtime. 10 mL 11  . insulin NPH Human (NOVOLIN N RELION) 100 UNIT/ML injection Inject 0.2 mLs (20 Units total) into the skin daily before breakfast. 160 mL 3  . LORazepam (ATIVAN) 0.5 MG tablet TAKE ONE TABLET BY MOUTH EVERY 8 HOURS AS NEEDED FOR ANXIETY OR AGITATION 30 tablet 0  . metFORMIN (GLUCOPHAGE-XR) 500 MG 24 hr tablet TAKE ONE TABLET BY MOUTH ONCE DAILY WITH BREAKFAST 90 tablet 3  . metoprolol (LOPRESSOR) 50 MG tablet Take 1 tablet (50 mg total) by mouth 2 (two) times daily.    . Multiple Vitamin (MULTIVITAMIN) capsule Take 1 capsule by mouth daily.    Marland Kitchen oxyCODONE (OXY IR/ROXICODONE) 5 MG immediate release tablet Take 1 tablet (5 mg total) by mouth every 6 (six) hours as needed for severe pain. 50 tablet 0  . promethazine (PHENERGAN) 12.5 MG tablet Take 0.5-1 tablets (6.25-12.5 mg total) by mouth every 6 (six) hours as needed for nausea or vomiting. 30 tablet 0  . rosuvastatin (CRESTOR) 40 MG tablet Take 1 tablet (40 mg total) by mouth daily at 6 PM.       Socially; patient lives at home with her husband in [place]. Husband has Alzheimer's disease in fact she is his caregiver. Currently a grandson is looking after the husband. Patient was independent with ADLs although she is legally blind cannot drive family helps with outside activities such as shopping.  reports that she has never smoked. She does not have any smokeless tobacco history on file. She reports that she does not drink alcohol or use illicit drugs.  family history includes Colon cancer (age of onset: 73) in her brother; Colon cancer (age of onset: 15) in her father; Heart attack in her brother; Kidney disease in her daughter; Leukemia in her son; Ovarian cancer in her mother; Pancreatitis in her brother.         Review of systems.  General does not complain of fever or chills.  Skin does not complain of rashes or itching surgical scar chest appears to be healing unremarkably does have some discomfort.  Head ears eyes nose mouth and throat-does not complain of sore throat or visual changes.  Respiratory does not complain of shortness of  breath or cough currently-she says she has benefited apparently from spirometry.  Cardiac does not complain of chest pain and does not appear to have significant lower extremity edema.  GI does not complain of abdominal discomfort nausea vomiting diarrhea constipation.  GU currently not complaining of dysuria.  Musculoskeletal has weakness but does not complain of joint pain.  Neurologic is not complaining of syncope dizziness or numbness or headache.  Psych does not complain of depression or anxiety.  Physical exam.  Temperature is 97.8 pulse 100 respirations 20 blood pressure 172/69 some variability at C1 41/52-193/94 apparently this was before she got her meds this morning.  In general this is a pleasant elderly resident in no distress resting comfortably in bed.  Her skin is warm and dry surgical site mid chest appears  to be unremarkable there is some crusting I do not see any concerning erythema or drainage.  Eyes pupils appear reactive light sclerae and conjunctivae are clear visual acuity appears grossly intact  Oropharynx is clear mucous membranes moist.  She has numerous extractions.  Chest is clear to auscultation there is somewhat air entry there is no labored breathing.  Heart is regular rate and rhythm without murmur gallop or rub she has trace lower extremity edema no significant sacral edema pedal pulses are intact.  Her abdomen soft nontender with positive bowel sounds.  GU cannot really appreciate suprapubic tenderness today I do not note any vaginal drainage or rash  Muscle skeletal limited exam since patient in bed but moves all extremities 4 strength appears to be intact I do not see any deformities.  Neurologic is grossly intact her speech is clear no lateralizing findings.  Psych she is alert and oriented doesn't and appropriate.  Labs.  09/01/2014.  Sodium 132 potassium 4 BUN 23 creatinine 0.76-albumen 3.2.  08/31/2014.  WBC 6.0 hemoglobin 10.5 platelets 109.  Assessment and plan.  #1-history coronary artery disease status post MI with CABG 3-clinically she appears to be doing well.   She is on aspirin as well as a beta blocker and statin currently denies shortness of breath or chest pain.  Spirometry will have to be encouraged.  #2 diabetes type 2 she is on Glucophage as well as NPH in the morning and NovoLog sliding scale with meals and at bedtime-sugars are somewhat variable 152 this morning 342 at noon and 170 this afternoon at this point continue to monitor.  #3-history of pancreatic insufficiency-she is on Creon apparently she has been on this long-term.  #4-history of hypertension she is on Vasotec 20 mg a day-hydralazine 10 mg 3 times a day-metoprolol 50 mg twice a day-it appears systolic somewhat elevated although we have minimal readings at this point will  monitor.  #5-pain management she is on oxycodone at this point appears to be effective OxyIR 5 mg every 6 hours when necessary.  #6-history of thrombocytopenia-this appears to have stabilized per recent lab- will need updating first laboratory day next week.  History of hyponatremia this appears to be stable as well will update metabolic panel next week as well.  [id number] note greater than 40 minutes spent assessing patient-reviewing her chart-and coordinating and formulating a plan of care for numerous diagnoses-of note greater than 50% of time spent coordinating plan of care  .  Marland Kitchen

## 2014-09-02 ENCOUNTER — Encounter: Payer: Self-pay | Admitting: Internal Medicine

## 2014-09-02 ENCOUNTER — Non-Acute Institutional Stay (SKILLED_NURSING_FACILITY): Payer: Medicare HMO | Admitting: Internal Medicine

## 2014-09-02 DIAGNOSIS — Z951 Presence of aortocoronary bypass graft: Secondary | ICD-10-CM | POA: Diagnosis not present

## 2014-09-02 DIAGNOSIS — I10 Essential (primary) hypertension: Secondary | ICD-10-CM | POA: Diagnosis not present

## 2014-09-02 DIAGNOSIS — I214 Non-ST elevation (NSTEMI) myocardial infarction: Secondary | ICD-10-CM

## 2014-09-02 DIAGNOSIS — T8351XA Infection and inflammatory reaction due to indwelling urinary catheter, initial encounter: Secondary | ICD-10-CM

## 2014-09-02 DIAGNOSIS — N390 Urinary tract infection, site not specified: Secondary | ICD-10-CM

## 2014-09-02 DIAGNOSIS — T83511A Infection and inflammatory reaction due to indwelling urethral catheter, initial encounter: Secondary | ICD-10-CM

## 2014-09-02 LAB — URINALYSIS, ROUTINE W REFLEX MICROSCOPIC
BILIRUBIN URINE: NEGATIVE
Glucose, UA: >1000 mg/dL — AB
Hgb urine dipstick: NEGATIVE
Ketones, ur: NEGATIVE mg/dL
LEUKOCYTES UA: NEGATIVE
NITRITE: NEGATIVE
PH: 7.5 (ref 5.0–8.0)
Protein, ur: NEGATIVE mg/dL
SPECIFIC GRAVITY, URINE: 1.01 (ref 1.005–1.030)
UROBILINOGEN UA: 0.2 mg/dL (ref 0.0–1.0)

## 2014-09-02 LAB — URINE MICROSCOPIC-ADD ON

## 2014-09-02 NOTE — Progress Notes (Signed)
Patient ID: Misty Edwards, female   DOB: 02-Feb-1927, 81 y.o.   MRN: [id number]     Facility; [facility] Chief complaint; admission to SNF post admit to [facility] from 6/9  to 6/16  History; this is an 81 year old woman who lives in [place] with her husband. 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 She was admitted to hospital with new onset of chest pain for the last 7-9 days. I believe she saw her primary physician who documented a normal EKG and she was admitted urgently to hospital with a non-ST elevation MI. She was taken to cardiac catheterization and was found to have severe multivessel coronary artery disease. She was seen in consultation by Dr. Nils Pyle. She was taken to the OR and had a CABG 3. She had a transesophageal echocardiogram the result of which I don't see however it is referenced as being fairly normal. In the ICU her cardiac output and cardiac index both looked reasonably normal. She was noted to have postoperative anemia and postoperative thrombocytopenia. Chest x-ray on 6/16 showed the small left greater than right pleural effusions and atelectasis otherwise clear  CBC Latest Ref Rng 08/31/2014 08/29/2014 08/28/2014  WBC 4.0 - 10.5 K/uL 6.0 8.1 7.4  Hemoglobin 12.0 - 15.0 g/dL 10.5(L) 11.4(L) 9.6(L)  Hematocrit 36.0 - 46.0 % 32.0(L) 34.4(L) 29.5(L)  Platelets 150 - 400 K/uL 109(L) 108(L) 74(L)    Past Medical History  Diagnosis Date  . Hypertension   . History of carcinoma in situ of breast 1988  . Acute biliary pancreatitis 07/2002    thia was in 07/2004:she still has pseudocyst in tail of pancreas measuring 54 x 33 mm   . Upper GI bleed September2004    secondary to gastritis  . Diabetes mellitus   . Adrenal adenoma     bilateral  . Detached retina   . Chronic pancreatitis   . Hyperlipidemia   . Osteopenia   . IBS (irritable bowel syndrome)   . Legally blind   . Anxiety   . Osteoarthritis   .  Diverticulosis     Past Surgical History  Procedure Laterality Date  . Colonoscopy  08/15/05    few tiny diverticula at sigmoid colon/external hemorrhoids but no polyps  . Right mastectomy    . Ectopic pregnancy surgery  1950's  . Tacking up of her bladder    . Laparoscopic cholecystectomy    . Ercp with sphincterotomy  07/2002  . Pancreatic pseudocyst drainage  June2004    drained percutaneously   . Esophagogastroduodenoscopy       Gastritis of body.  Otherwise normal  . Colonoscopy  01/08/2012    QMG:NOIBBCW diverticulosis. Next colonoscopy in 12/2016  . Esophagogastroduodenoscopy  01/08/2012    RMR: Few scattered gastric erosions of uncertain significance-status post biopsy. Minimal chronic inflammation, no H.pylori  . Cardiac catheterization N/A 08/24/2014    Procedure: Left Heart Cath and Coronary Angiography;  Surgeon: Leonie Man, MD;  Location: [facility];  Service: Cardiovascular;  Laterality: N/A;  . Coronary artery bypass graft N/A 08/24/2014    Procedure: CORONARY ARTERY BYPASS GRAFTING (CABG) x three, using left internal mammary artery and right leg greater saphenous vein harvested endoscopically;  Surgeon: Ivin Poot, MD;  Location: [facility];  Service: Open Heart Surgery;  Laterality: N/A;  . Tee without cardioversion N/A 08/24/2014    Procedure: TRANSESOPHAGEAL ECHOCARDIOGRAM (TEE);  Surgeon: Ivin Poot, MD;  Location: [facility];  Service: Open Heart Surgery;  Laterality: N/A;    Current Outpatient Prescriptions on File Prior to Visit  Medication Sig Dispense Refill  . acetaminophen (TYLENOL) 500 MG tablet Take 500-1,000 mg by mouth 2 (two) times daily as needed. Pain.    Marland Kitchen aspirin EC 81 MG EC tablet Take 1 tablet (81 mg total) by mouth daily.    . cetirizine (ZYRTEC) 10 MG tablet TAKE ONE TABLET BY MOUTH ONCE DAILY 90 tablet 3  . CREON 24000 UNITS CPEP 2 w/ meals, 1 w/ snacks 720 capsule 3  . cycloSPORINE (RESTASIS) 0.05 % ophthalmic emulsion Place 1 drop into  both eyes 3 (three) times daily.     . enalapril (VASOTEC) 20 MG tablet Take 1 tablet (20 mg total) by mouth daily. 90 tablet 3  . esomeprazole (NEXIUM) 40 MG capsule Take 1 capsule (40 mg total) by mouth daily before breakfast. 30 capsule 11  . feeding supplement, ENSURE ENLIVE, (ENSURE ENLIVE) LIQD Take 237 mLs by mouth 2 (two) times daily between meals. 237 mL 12  . hydrALAZINE (APRESOLINE) 10 MG tablet Take 1 tablet (10 mg total) by mouth every 8 (eight) hours.    . insulin aspart (NOVOLOG) 100 UNIT/ML injection Inject 0-24 Units into the skin 4 (four) times daily -  with meals and at bedtime. 10 mL 11  . insulin NPH Human (NOVOLIN N RELION) 100 UNIT/ML injection Inject 0.2 mLs (20 Units total) into the skin daily before breakfast. 160 mL 3  . LORazepam (ATIVAN) 0.5 MG tablet TAKE ONE TABLET BY MOUTH EVERY 8 HOURS AS NEEDED FOR ANXIETY OR AGITATION 30 tablet 0  . metFORMIN (GLUCOPHAGE-XR) 500 MG 24 hr tablet TAKE ONE TABLET BY MOUTH ONCE DAILY WITH BREAKFAST 90 tablet 3  . metoprolol (LOPRESSOR) 50 MG tablet Take 1 tablet (50 mg total) by mouth 2 (two) times daily.    . Multiple Vitamin (MULTIVITAMIN) capsule Take 1 capsule by mouth daily.    Marland Kitchen oxyCODONE (OXY IR/ROXICODONE) 5 MG immediate release tablet Take 1 tablet (5 mg total) by mouth every 6 (six) hours as needed for severe pain. 50 tablet 0  . promethazine (PHENERGAN) 12.5 MG tablet Take 0.5-1 tablets (6.25-12.5 mg total) by mouth every 6 (six) hours as needed for nausea or vomiting. 30 tablet 0  . rosuvastatin (CRESTOR) 40 MG tablet Take 1 tablet (40 mg total) by mouth daily at 6 PM.      Socially; patient lives at home with her husband in [place]. Husband has Alzheimer's disease in fact she is his caregiver. Currently a grandson is looking after the husband. Patient was independent with ADLs although she is legally blind cannot drive family helps with outside activities such as shopping. The patient's grandson would pull up her insulin  and leave it in the fridge for the patient to self administer  reports that she has never smoked. She does not have any smokeless tobacco history on file. She reports that she does not drink alcohol or use illicit drugs.  family history includes Colon cancer (age of onset: 53) in her brother; Colon cancer (age of onset: 82) in her father; Heart attack in her brother; Kidney disease in her daughter; Leukemia in her son; Ovarian cancer in her mother; Pancreatitis in her brother.   Review of systems; Gen; no weight loss Respiratory; no shortness of breath, no cough, Cardiac; no exertional chest pain, no palpitations GI; no abnormal pain, no change in bowel habits, she is complaining of  some nausea last night GU; complaining of dysuria and some urinary incontinence which is new Musculoskeletal; no joint pain Mental status; had trouble sleeping last night  Physical exam Gen. the patient is awake alert conversational Vitals; O2 sat is 95% on room air respirations 20 pulse rate 100 and regular Respiratory; clear air entry bilaterally Cardiac heart sounds are normal there is no S4 no S3 JVP is not elevated there is no coccyx edema no peripheral edema Abdomen; no liver no spleen no tenderness GU; the patient has some degree of suprapubic tenderness there is no CVA tenderness Extremities no evidence of a DVT  Impression/plan #1 status post non-ST elevation MI and CABG 3 the patient is doing well him a cardiopulmonary point of view #2 she had a transesophageal echocardiogram, I had trouble pulling up the report but references make me believe this was normal #3 type 2 diabetes on insulin low I also note that she has chronic pancreatitis  #4 possible UTI. I am concerned enough about this to give her empiric and he biotics even before the urinalysis come back, it is quite likely she had a prolonged catheterization in the hospital. CBG this morning 203 #5 lab work looks like it's improving in terms of  her hemoglobin and platelet count  #6 hyponatremia this looks like it is improving she appears to be euvolemic #7 history of chronic pancreatitis with exocrine pancreatic insufficiency   Results for ELLAMARIE, NAEVE (MRN [id number]) as of 09/02/2014 08:49  Ref. Range 08/31/2014 04:03 08/31/2014 05:34 08/31/2014 06:07 08/31/2014 11:12 09/01/2014 07:10  Glucose-Capillary Latest Ref Range: 65-99 mg/dL  174 (H)  269 (H)   Sodium Latest Ref Range: 135-145 mmol/L 130 (L)    132 (L)  Potassium Latest Ref Range: 3.5-5.1 mmol/L 3.7    4.0  Chloride Latest Ref Range: 101-111 mmol/L 87 (L)    91 (L)  CO2 Latest Ref Range: 22-32 mmol/L 31    31  BUN Latest Ref Range: 6-20 mg/dL 20    23 (H)  Creatinine Latest Ref Range: 0.44-1.00 mg/dL 0.79    0.76  Calcium Latest Ref Range: 8.9-10.3 mg/dL 8.2 (L)    8.6 (L)  EGFR (Non-African Amer.) Latest Ref Range: >60 mL/min >60    >60  EGFR (African American) Latest Ref Range: >60 mL/min >60    >60  Glucose Latest Ref Range: 65-99 mg/dL 210 (H)    163 (H)  Anion gap Latest Ref Range: 5-'15  12    10  ' Alkaline Phosphatase Latest Ref Range: 38-126 U/L 39    40  Albumin Latest Ref Range: 3.5-5.0 g/dL 2.8 (L)    3.2 (L)  AST Latest Ref Range: 15-41 U/L 28    28  ALT Latest Ref Range: 14-54 U/L 27    32  Total Protein Latest Ref Range: 6.5-8.1 g/dL 5.7 (L)    6.3 (L)  Total Bilirubin Latest Ref Range: 0.3-1.2 mg/dL 1.0    0.9  WBC Latest Ref Range: 4.0-10.5 K/uL 6.0      RBC Latest Ref Range: 3.87-5.11 MIL/uL 3.91      Hemoglobin Latest Ref Range: 12.0-15.0 g/dL 10.5 (L)      HCT Latest Ref Range: 36.0-46.0 % 32.0 (L)      MCV Latest Ref Range: 78.0-100.0 fL 81.8      MCH Latest Ref Range: 26.0-34.0 pg 26.9      MCHC Latest Ref Range: 30.0-36.0 g/dL 32.8      RDW Latest Ref Range: 11.5-15.5 %  14.7      Platelets Latest Ref Range: 150-400 K/uL 109 (L)      DG CHEST 2 VIEW Unknown   Rpt

## 2014-09-04 LAB — URINE CULTURE: CULTURE: NO GROWTH

## 2014-09-11 ENCOUNTER — Non-Acute Institutional Stay (SKILLED_NURSING_FACILITY): Payer: Medicare HMO | Admitting: Internal Medicine

## 2014-09-11 DIAGNOSIS — I214 Non-ST elevation (NSTEMI) myocardial infarction: Secondary | ICD-10-CM | POA: Diagnosis not present

## 2014-09-11 DIAGNOSIS — Z951 Presence of aortocoronary bypass graft: Secondary | ICD-10-CM

## 2014-09-12 ENCOUNTER — Encounter: Payer: Medicare HMO | Admitting: Adult Health

## 2014-09-12 NOTE — Progress Notes (Addendum)
Patient ID: Misty Edwards, female   DOB: 1927-02-02, 81 y.o.   MRN: [id number]                PROGRESS NOTE  DATE:  09/11/2014            FACILITY: [facility]                   LEVEL OF CARE:   SNF   Acute Visit                             CHIEF COMPLAINT:  Follow up medical issues, status post MI.    HISTORY OF PRESENT ILLNESS:  This is an 81 year-old  woman who came in with progressive chest pain over 7-9 days before admission.  She was admitted on 08/24/2014.  She had an abnormal EKG and was admitted urgently to the hospital with a non-ST elevation MI.  She was taken to the Cardiac Cath Lab and was found to have severe multi-vessel coronary artery disease, and ultimately was taken to the OR for a CABG x3.   She is here for rehabilitation.    I thought there was reference to an echocardiogram done postop, although I do not see this report.    REVIEW OF SYSTEMS:    HEENT:  The patient has a lip ulcer.   CHEST/RESPIRATORY:  Still short of breath, but this is not clearly exertional.   CARDIAC:  She is not complaining of exertional chest pain, although she did not tell me that she had chest pain even before this.  She said it was "gas pain".   GI:  No nausea, vomiting, or diarrhea.    PHYSICAL EXAMINATION:   VITAL SIGNS:     PULSE:  88.   RESPIRATIONS:  18.   02 SATURATIONS:  96% on room air.   WEIGHT:  140.2 pounds, which is exactly the same as it was on her admission nine days ago.   GENERAL APPEARANCE:  The patient is not in any distress.  Lying in bed.   CHEST/RESPIRATORY:  Clear air entry bilaterally.    CARDIOVASCULAR:   CARDIAC:  There is a soft midsystolic murmur.  There is no S3.  Her JVP is to the angle of the jaw at 45.   EDEMA/VARICOSITIES:  She has scant coccyx edema.  No peripheral edema is noted.    ASSESSMENT/PLAN:             Status post non-ST elevation MI and coronary artery bypass grafting x3.  I do not get a sense of exertional chest symptoms  here, although her shortness of breath issue is a bit of a worry.  She did not come out of the hospital on any diuretics.  Her JVP is elevated.  I will probably start her on a small dose of diuretic.    Type 2 diabetes.  On metformin XR 500 mg a day as well as Novolin N insulin 20 U in the morning.  This seems stable.  We will need to verify that she is able to give insulin prior to discharge.     CPT CODE: 78295

## 2014-09-12 NOTE — Progress Notes (Signed)
Cardiology Office Note   Date:  09/12/2014   ID:  NINOSKA GOSWICK, DOB 1926-09-14, MRN [id number]  PCP:  Odette Fraction, MD  Cardiologist:  To be established/ Jory Sims, NP   Rescheduled.   tCarePhone: [phone]; Fax: [phone]

## 2014-09-14 ENCOUNTER — Encounter: Payer: Self-pay | Admitting: Adult Health

## 2014-09-14 ENCOUNTER — Ambulatory Visit (INDEPENDENT_AMBULATORY_CARE_PROVIDER_SITE_OTHER): Payer: Medicare HMO | Admitting: Adult Health

## 2014-09-14 VITALS — BP 130/62 | HR 87 | Ht 63.0 in | Wt 134.4 lb

## 2014-09-14 DIAGNOSIS — I2510 Atherosclerotic heart disease of native coronary artery without angina pectoris: Secondary | ICD-10-CM

## 2014-09-14 NOTE — Progress Notes (Deleted)
Name: Misty Edwards    DOB: [birth year]  Age: 81 y.o.  MR#: [id number]       PCP:  Odette Fraction, MD      Insurance: Payor: [employer]alling MEDICARE / Plan: [employer] MEDICARE HMO/PPO / Product Type: *No Product type* /   CC:    Chief Complaint  Patient presents with  . Coronary Artery Disease    NSTEMI s/p CABG    VS Filed Vitals:   09/14/14 1526  BP: 130/62  Pulse: 87  Height: '5\' 3"'  (1.6 m)  Weight: 134 lb 6.4 oz (60.963 kg)  SpO2: 97%    Weights Current Weight  09/14/14 134 lb 6.4 oz (60.963 kg)  08/31/14 141 lb 12.1 oz (64.3 kg)  08/24/14 140 lb (63.504 kg)    Blood Pressure  BP Readings from Last 3 Encounters:  09/14/14 130/62  09/02/14 172/69  08/31/14 118/52     Admit date:  (Not on file) Last encounter with RMR:  Visit date not found   Allergy Calcium-containing compounds; Raloxifene; and Vitamin d analogs  No current outpatient prescriptions on file.   No current facility-administered medications for this visit.    Discontinued Meds:    Medications Discontinued During This Encounter  Medication Reason  . metFORMIN (GLUCOPHAGE-XR) 500 MG 24 hr tablet Error    Patient Active Problem List   Diagnosis Date Noted  . S/P CABG x 3 08/25/2014  . NSTEMI (non-ST elevated myocardial infarction) 08/24/2014  . Acute coronary syndrome   . IBS (irritable bowel syndrome) 08/26/2012  . Thrombocytopenia 02/02/2012  . Epigastric pain 02/02/2012  . Gastritis 02/02/2012  . Abdominal pain 12/17/2011  . Normocytic anemia 12/17/2011  . History of colon polyps 12/17/2011  . Constipation 12/17/2011  . Family history of colon cancer 08/22/2011  . LLQ pain 08/21/2011  . CHRONIC PANCREATITIS 09/12/2008  . HELICOBACTER PYLORI INFECTION 01/26/2008  . ADENOCARCINOMA, BREAST 01/26/2008  . Diabetes mellitus type 2, insulin dependent 01/26/2008  . Essential hypertension 01/26/2008  . EXTERNAL HEMORRHOIDS 01/26/2008  . GERD 01/26/2008  . CONSTIPATION 01/26/2008  . PSEUDOCYST,  PANCREAS 01/26/2008  . GALLSTONE PANCREATITIS 01/26/2008  . GI BLEEDING 01/26/2008  . UTI 01/26/2008  . NAUSEA 01/26/2008  . VOMITING 01/26/2008  . DIARRHEA 01/26/2008    LABS    Component Value Date/Time   NA 132* 09/01/2014 0710   NA 130* 08/31/2014 0403   NA 132* 08/29/2014 0249   NA 136* 10/15/2011 1538   K 4.0 09/01/2014 0710   K 3.7 08/31/2014 0403   K 3.4* 08/29/2014 0249   K 4.5 10/15/2011 1538   CL 91* 09/01/2014 0710   CL 87* 08/31/2014 0403   CL 87* 08/29/2014 0249   CO2 31 09/01/2014 0710   CO2 31 08/31/2014 0403   CO2 34* 08/29/2014 0249   GLUCOSE 163* 09/01/2014 0710   GLUCOSE 210* 08/31/2014 0403   GLUCOSE 93 08/29/2014 0249   BUN 23* 09/01/2014 0710   BUN 20 08/31/2014 0403   BUN 14 08/29/2014 0249   BUN 15 10/15/2011 1538   CREATININE 0.76 09/01/2014 0710   CREATININE 0.79 08/31/2014 0403   CREATININE 0.66 08/29/2014 0249   CREATININE 0.77 01/13/2014 0856   CREATININE 0.95 07/19/2013 0912   CREATININE 0.87 02/04/2013 0826   CALCIUM 8.6* 09/01/2014 0710   CALCIUM 8.2* 08/31/2014 0403   CALCIUM 8.7* 08/29/2014 0249   CALCIUM 9.7 10/15/2011 1538   GFRNONAA >60 09/01/2014 0710   GFRNONAA >60 08/31/2014 0403   GFRNONAA >60 08/29/2014 0249  GFRNONAA 71 01/13/2014 0856   GFRNONAA 55* 07/19/2013 0912   GFRNONAA 61 02/04/2013 0826   GFRAA >60 09/01/2014 0710   GFRAA >60 08/31/2014 0403   GFRAA >60 08/29/2014 0249   GFRAA 81 01/13/2014 0856   GFRAA 64 07/19/2013 0912   GFRAA 71 02/04/2013 0826   CMP     Component Value Date/Time   NA 132* 09/01/2014 0710   NA 136* 10/15/2011 1538   K 4.0 09/01/2014 0710   K 4.5 10/15/2011 1538   CL 91* 09/01/2014 0710   CO2 31 09/01/2014 0710   GLUCOSE 163* 09/01/2014 0710   BUN 23* 09/01/2014 0710   BUN 15 10/15/2011 1538   CREATININE 0.76 09/01/2014 0710   CREATININE 0.77 01/13/2014 0856   CALCIUM 8.6* 09/01/2014 0710   CALCIUM 9.7 10/15/2011 1538   PROT 6.3* 09/01/2014 0710   ALBUMIN 3.2* 09/01/2014  0710   AST 28 09/01/2014 0710   AST 18 10/15/2011 1538   ALT 32 09/01/2014 0710   ALKPHOS 40 09/01/2014 0710   ALKPHOS 33 10/15/2011 1538   BILITOT 0.9 09/01/2014 0710   BILITOT 0.5 10/15/2011 1538   GFRNONAA >60 09/01/2014 0710   GFRNONAA 71 01/13/2014 0856   GFRAA >60 09/01/2014 0710   GFRAA 81 01/13/2014 0856       Component Value Date/Time   WBC 6.0 08/31/2014 0403   WBC 8.1 08/29/2014 0249   WBC 7.4 08/28/2014 0420   HGB 10.5* 08/31/2014 0403   HGB 11.4* 08/29/2014 0249   HGB 9.6* 08/28/2014 0420   HCT 32.0* 08/31/2014 0403   HCT 34.4* 08/29/2014 0249   HCT 29.5* 08/28/2014 0420   HCT 34 10/15/2011 1540   MCV 81.8 08/31/2014 0403   MCV 81.9 08/29/2014 0249   MCV 82.6 08/28/2014 0420   MCV 83.8 10/15/2011 1540    Lipid Panel     Component Value Date/Time   CHOL 126 01/13/2014 0856   TRIG 112 01/13/2014 0856   TRIG 90 10/15/2011 1538   HDL 56 01/13/2014 0856   CHOLHDL 2.3 01/13/2014 0856   VLDL 22 01/13/2014 0856   LDLCALC 48 01/13/2014 0856    ABG    Component Value Date/Time   PHART 7.353 08/25/2014 1315   PCO2ART 40.4 08/25/2014 1315   PO2ART 116.0* 08/25/2014 1315   HCO3 22.4 08/25/2014 1315   TCO2 23 08/25/2014 1604   ACIDBASEDEF 3.0* 08/25/2014 1315   O2SAT 79.9 08/27/2014 0345     Lab Results  Component Value Date   TSH 0.753 08/21/2011   BNP (last 3 results) No results for input(s): BNP in the last 8760 hours.  ProBNP (last 3 results) No results for input(s): PROBNP in the last 8760 hours.  Cardiac Panel (last 3 results) No results for input(s): CKTOTAL, CKMB, TROPONINI, RELINDX in the last 72 hours.  Iron/TIBC/Ferritin/ %Sat No results found for: IRON, TIBC, FERRITIN, IRONPCTSAT   EKG Orders placed or performed during the hospital encounter of 08/24/14  . EKG 12-Lead  . EKG 12-Lead  . EKG 12-Lead  . EKG 12-Lead  . EKG 12-Lead  . EKG 12-Lead  . EKG     Prior Assessment and Plan Problem List as of 1/94/1740    HELICOBACTER  PYLORI INFECTION   ADENOCARCINOMA, BREAST   Diabetes mellitus type 2, insulin dependent   Essential hypertension   EXTERNAL HEMORRHOIDS   GERD   Last Assessment & Plan 08/26/2012 Office Visit Written 08/27/2012  3:22 PM by Mahala Menghini, PA-C    Overall  she seems to be doing well. Requires BID PPI for adequate control of GERD. Still using Reglan prn nausea and at sometimes up to 2-3 times per day. She reports much of her nausea is in early AM prior to meals. She may have element of gastroparesis but I doubt significant. She tolerates meals, denies early satiety. Suspect element of hypoglycemia as factor in early AM nausea. Requested she check blood glucose with these episodes to try and determine a correlation. If hypoglycemia documented, she is to f/u with Dr. Dennard Schaumann.   She also has intermittent frequent loose stools which she self medicates with Imodium. She states she is taking her Creon as prescribed. She is up-to-date on her colonoscopy. Trial of Levsin. Call with further problems. Office visit in May 2015 by Dr. Gala Romney.      CONSTIPATION   Last Assessment & Plan 08/21/2011 Office Visit Written 08/22/2011 12:42 PM by Andria Meuse, NP    Call if you decide you want to proceed with colonoscopy TSH, Met 7 Use MiraLax 17 g daily as needed for constipation Begin align daily      CHRONIC PANCREATITIS   Last Assessment & Plan 08/21/2011 Office Visit Written 08/22/2011 12:43 PM by Andria Meuse, NP    Doing well until acute onset left sided pain in past 24 hrs.  Doubt this is due to her pancreatitis.  Continue pancreatic enzymes.      PSEUDOCYST, PANCREAS   GALLSTONE PANCREATITIS   GI BLEEDING   UTI   NAUSEA   Last Assessment & Plan 04/12/2012 Office Visit Written 04/12/2012 10:49 AM by Orvil Feil, NP    Unclear etiology at this point; CT overall benign from Nov 2013. Question an element of diabetic gastroparesis. She has had great improvement with low-dose Reglan QID. She has completed 3  refills of a 10 day course; therefore, she ultimately has not taken continuously since November. She is asking for another refill. I have discussed the side effects of this medication in detail with the patient and her daughter. I would feel more comfortable decreasing her to twice a day dosing, then once a day, then weaning off. Another agent that may be helpful is Zofran. Will hold off on GES, as it will not change the plan of care. She has an updated EGD on file and TCS. Continue PPI BID and Creon as prescribed. Return in 3 months to see Dr. Gala Romney.       VOMITING   DIARRHEA   LLQ pain   Last Assessment & Plan 08/21/2011 Office Visit Written 08/22/2011 12:15 PM by Andria Meuse, NP    Elberta Leatherwood [place] is a pleasant 81 y.o. female in her usual state of health with acute onset LLQ pain.  She has hx chronic pancreatitis & pseudocysts but really has done very well for the past several years until the last 24 hrs.  No significant associated symptoms with pain such as N/V, fever, diarrhea, etc.  ?chronic pancreatitis flare, evolving acute illness, vs. Pain secondary to chronic constipation/obstipation.    CBC, LFTs, lipase Offered pain meds, pt declined.  Instructed to ER if severe pain  If you need something for pain please call me Use MiraLax 17 g daily as needed for constipation Begin align daily      Family history of colon cancer   Last Assessment & Plan 08/21/2011 Office Visit Written 08/22/2011  4:20 PM by Andria Meuse, NP    Very spry 81 y/o female due for  high-risk screening colonoscopy, however pt declines at this time given her age.  I feel this is reasonable.  She will call if she changes her mind.      Abdominal pain   Normocytic anemia   Last Assessment & Plan 12/17/2011 Office Visit Written 12/17/2011  9:56 AM by Mahala Menghini, PA    Stable normocytic anemia, mild thrombocytopenia without h/o liver disease. Patient with personal history of breast cancer, reported h/o colon polyps, FH  of two first degree relatives with colon cancer at relatively young age.   Chronic constipation. Chronic vague upper abdominal pain with intermittent Aleve use, indigestion. Offered patient EGD/TCS for further evaluation.  I have discussed the risks, alternatives, benefits with regards to but not limited to the risk of reaction to medication, bleeding, infection, perforation and the patient is agreeable to proceed. Written consent to be obtained.  She reports inadequate sedation with last colonoscopy in 2007. Will augment conscious sedation with phenergan 12.46m IV 30 minutes before procedure.   Based on EGD/TCS findings, we may need to consider abd u/s to rule out splenomegaly or liver disease.       History of colon polyps   Last Assessment & Plan 04/12/2012 Office Visit Written 04/12/2012 10:47 AM by AOrvil Feil NP    Surveillance due Oct 2018.       Constipation   Last Assessment & Plan 12/17/2011 Office Visit Written 12/17/2011  9:56 AM by LMahala Menghini PA    Take Align one daily for four weeks. Take Benefiber daily.      Thrombocytopenia   Epigastric pain   Gastritis   IBS (irritable bowel syndrome)   NSTEMI (non-ST elevated myocardial infarction)   Acute coronary syndrome   S/P CABG x 3       Imaging: Dg Chest 2 View  08/31/2014   CLINICAL DATA:  Chest soreness, post CABG  EXAM: CHEST  2 VIEW  COMPARISON:  Chest x-ray of 08/29/2014  FINDINGS: There is little change in opacities at the lung bases consistent with small pleural effusions and basilar atelectasis. Mild cardiomegaly is stable. Median sternotomy sutures are noted from CABG. Surgical clips also overlie the right axilla. No bony abnormality is seen. The bones are somewhat osteopenic.  IMPRESSION: Little change in bilateral small pleural effusions with bibasilar atelectasis.   Electronically Signed   By: PIvar DrapeM.D.   On: 08/31/2014 08:01   Dg Chest 2 View  08/29/2014   CLINICAL DATA:  Post CABG  EXAM: CHEST  2  VIEW  COMPARISON:  08/28/2014  FINDINGS: Cardiomediastinal silhouette is stable. The patient is status post CABG. Surgical clips in right axilla again noted. No acute infiltrate or pulmonary edema. Slight improvement in aeration. Right IJ central line has been removed. Small residual left pleural effusion with left basilar atelectasis. No segmental infiltrate.  Mild right basilar atelectasis.  IMPRESSION: Status post CABG. Small residual left pleural effusion with left basilar atelectasis. Mild right basilar atelectasis. No segmental infiltrate or pulmonary edema.   Electronically Signed   By: LLahoma CrockerM.D.   On: 08/29/2014 07:55   Dg Chest 2 View  08/24/2014   CLINICAL DATA:  Generalized chest pain yesterday. No pain today. Sudden onset of weakness.  EXAM: CHEST - 2 VIEW  COMPARISON:  None.  FINDINGS: The heart size is normal. Emphysematous changes are evident. Mild interstitial coarsening is chronic. Surgical clips are present in the right axilla and chest wall.  IMPRESSION: 1. Emphysema. 2. No  acute cardiopulmonary disease.   Electronically Signed   By: San Morelle M.D.   On: 08/24/2014 14:09   Dg Chest Port 1 View  08/28/2014   CLINICAL DATA:  CABG.  EXAM: PORTABLE CHEST - 1 VIEW  COMPARISON:  08/27/2014.  FINDINGS: Right IJ sheath in stable position. Prior CABG. Mild cardiomegaly. Pulmonary venous congestion bilateral pulmonary interstitial prominence consistent congestive heart failure. Mild left base subsegmental atelectasis. Small left pleural effusions . Surgical clips right axilla.  IMPRESSION: 1.  Right IJ sheath in stable position .  2. Prior CABG. Congestive heart failure with mild bilateral pulmonary interstitial edema. Left base subsegmental atelectasis.   Electronically Signed   By: Marcello Moores  Register   On: 08/28/2014 07:34   Dg Chest Port 1 View  08/27/2014   CLINICAL DATA:  Status post CABG x3.  Hypertension.  EXAM: PORTABLE CHEST - 1 VIEW  COMPARISON:  1 day prior  FINDINGS: Right  internal jugular Cordis sheath remains in place. Prior median sternotomy. Surgical clips in the right axilla. Midline trachea. Cardiomegaly accentuated by AP portable technique. Suspect a small layering left pleural effusion. No pneumothorax. Mild interstitial edema is similar. Left base airspace disease is not significantly changed.  IMPRESSION: No significant change since one day prior.  Mild interstitial edema with probable layering left pleural effusion.  Left base airspace disease.   Electronically Signed   By: Abigail Miyamoto M.D.   On: 08/27/2014 07:36   Dg Chest Port 1 View  08/26/2014   CLINICAL DATA:  Coronary artery bypass graft  EXAM: PORTABLE CHEST - 1 VIEW  COMPARISON:  08/25/2014  FINDINGS: The endotracheal tube and nasogastric tube have been removed. The left chest tube has been removed. The mediastinal drain has been removed. The Swan-Ganz catheter has been removed. The right jugular sheath remains.  There is no pneumothorax. There are mild basilar opacities, particularly on the left were there is worsened confluent consolidation or atelectasis.  IMPRESSION: Removal of ET tube, NG tube, left chest tube, mediastinal drain and Swan-Ganz catheter. Right jugular sheath remains.  Mildly worsened left base consolidation.   Electronically Signed   By: Andreas Newport M.D.   On: 08/26/2014 05:53   Dg Chest Port 1 View  08/25/2014   CLINICAL DATA:  Coronary artery bypass grafting  EXAM: PORTABLE CHEST - 1 VIEW  COMPARISON:  08/25/2014 at 00:05  FINDINGS: Endotracheal tube is 4.4 cm above the carina. The nasogastric tube extends into the stomach. There is a right jugular Swan-Ganz catheter with tip in the right main pulmonary artery. There is a left chest tube and mediastinal drain.  There is no pneumothorax. There is improvement, with clearance of much of the vascular congestion and central alveolar edema. No large effusions.  IMPRESSION: Support equipment appears satisfactorily positioned.  Improved,  with clearance of much of the perihilar edema and vascular congestive change.   Electronically Signed   By: Andreas Newport M.D.   On: 08/25/2014 06:00   Dg Chest Portable 1 View  08/25/2014   CLINICAL DATA:  Postop emergency CABG.  EXAM: PORTABLE CHEST - 1 VIEW  COMPARISON:  Chest film 08/24/2014.  FINDINGS: Endotracheal tube 6 cm above carina, satisfactory position. Swan-Ganz catheter tip RIGHT main pulmonary artery. Mediastinal tube good position. LEFT chest tube good position. Prior median sternotomy. No pneumothorax. Mild vascular congestion. No enteric tube is visualized. Epicardial leads. Previous mastectomy and axillary node dissection.  IMPRESSION: Satisfactory appearance status post emergency CABG.   Electronically Signed  By: Rolla Flatten M.D.   On: 08/25/2014 00:30

## 2014-09-14 NOTE — Progress Notes (Signed)
Cardiology Office Note   Date:  09/14/2014   ID:  Misty Edwards, DOB [birth year], MRN [id number]  PCP:  Odette Fraction, MD  Cardiologist:  Top be est Jory Sims, NP   Chief Complaint  Patient presents with  . Coronary Artery Disease    NSTEMI s/p CABG      History of Present Illness: Misty Edwards is a 81 y.o. female who presents for post hospitalization followup after admission for a non-ST elevation MIresulting in a cardiac catheterization demonstrating multivessel disease.  The patient had severe left main disease with three-vessel CAD with an LVEF of 50%.  The patient was sent for emergency coronary artery bypass grafting.    This was completed by Dr. Ricard Dillon with LIMA to LAD, SVG to OM, and SVG to PDA.the patient had some postoperative thrombocytopenia, and postoperative care from this avoided.  Her blood sugars were controlled.  She had some postoperative volume overload, which responded well to diuretics.  The patient was transferred to a skilled nursing facility for physical rehabilitation as discharged due to debilitated state.  The patient is still currently at a skilled nursing facility and is undergoing physical therapy.  She states she is feeling stronger every day, but is not ready yet to return home.  She states she would like to be able to walk alone without support.  At this time.  She is unable to do so.  Her only other complaint is constipation related to oxycodone prescribed prior to physical therapy to reduce pain.    Past Medical History  Diagnosis Date  . Hypertension   . History of carcinoma in situ of breast 1988  . Acute biliary pancreatitis 07/2002    thia was in 07/2004:she still has pseudocyst in tail of pancreas measuring 54 x 33 mm   . Upper GI bleed September2004    secondary to gastritis  . Diabetes mellitus   . Adrenal adenoma     bilateral  . Detached retina   . Chronic pancreatitis   . Hyperlipidemia   . Osteopenia   . IBS (irritable  bowel syndrome)   . Legally blind   . Anxiety   . Osteoarthritis   . Diverticulosis     Past Surgical History  Procedure Laterality Date  . Colonoscopy  08/15/05    few tiny diverticula at sigmoid colon/external hemorrhoids but no polyps  . Right mastectomy    . Ectopic pregnancy surgery  1950's  . Tacking up of her bladder    . Laparoscopic cholecystectomy    . Ercp with sphincterotomy  07/2002  . Pancreatic pseudocyst drainage  June2004    drained percutaneously   . Esophagogastroduodenoscopy       Gastritis of body.  Otherwise normal  . Colonoscopy  01/08/2012    WUJ:[id number] diverticulosis. Next colonoscopy in 12/2016  . Esophagogastroduodenoscopy  01/08/2012    RMR: Few scattered gastric erosions of uncertain significance-status post biopsy. Minimal chronic inflammation, no H.pylori  . Cardiac catheterization N/A 08/24/2014    Procedure: Left Heart Cath and Coronary Angiography;  Surgeon: Leonie Man, MD;  Location: [facility];  Service: Cardiovascular;  Laterality: N/A;  . Coronary artery bypass graft N/A 08/24/2014    Procedure: CORONARY ARTERY BYPASS GRAFTING (CABG) x three, using left internal mammary artery and right leg greater saphenous vein harvested endoscopically;  Surgeon: Ivin Poot, MD;  Location: [facility];  Service: Open Heart Surgery;  Laterality: N/A;  . Tee without cardioversion N/A 08/24/2014  Procedure: TRANSESOPHAGEAL ECHOCARDIOGRAM (TEE);  Surgeon: Ivin Poot, MD;  Location: [facility];  Service: Open Heart Surgery;  Laterality: N/A;     No current outpatient prescriptions on file.   No current facility-administered medications for this visit.    Allergies:   Calcium-containing compounds; Raloxifene; and Vitamin d analogs    Social History:  The patient  reports that she has never smoked. She does not have any smokeless tobacco history on file. She reports that she does not drink alcohol or use illicit drugs.   Family History:  The patient's  family history includes Colon cancer (age of onset: 61) in her brother; Colon cancer (age of onset: 86) in her father; Heart attack in her brother; Kidney disease in her daughter; Leukemia in her son; Ovarian cancer in her mother; Pancreatitis in her brother.    ROS: .   All other systems are reviewed and negative.Unless otherwise mentioned in H&P above.   PHYSICAL EXAM: VS:  BP 130/62 mmHg  Pulse 87  Ht 5\' 3"  (1.6 m)  Wt 134 lb 6.4 oz (60.963 kg)  BMI 23.81 kg/m2  SpO2 97% , BMI Body mass index is 23.81 kg/(m^2). GEN: Well nourished, well developed, in no acute distress HEENT: normal Neck: no JVD, carotid bruits, or masses Cardiac: RRR; no murmurs, rubs, or gallops,no edema  Respiratory:  Clear to auscultation bilaterally, normal work of breathing GI: soft, nontender, nondistended, + BS MS: no deformity or atrophy Skin: warm and dry, no rash Neuro:  Strength and sensation are intact, frail. Psych: euthymic mood, full affect   Recent Labs: 08/25/2014: Magnesium 2.1 08/31/2014: Hemoglobin 10.5*; Platelets 109* 09/01/2014: ALT 32; BUN 23*; Creatinine, Ser 0.76; Potassium 4.0; Sodium 132*    Lipid Panel    Component Value Date/Time   CHOL 126 01/13/2014 0856   TRIG 112 01/13/2014 0856   TRIG 90 10/15/2011 1538   HDL 56 01/13/2014 0856   CHOLHDL 2.3 01/13/2014 0856   VLDL 22 01/13/2014 0856   LDLCALC 48 01/13/2014 0856      Wt Readings from Last 3 Encounters:  09/14/14 134 lb 6.4 oz (60.963 kg)  08/31/14 141 lb 12.1 oz (64.3 kg)  08/24/14 140 lb (63.504 kg)     ASSESSMENT AND PLAN:  1. Severe three-vessel coronary artery disease, with left main disease: She is status post coronary artery bypass grafting. There is no evidence of fluid overload or arrhythmias on examination today in the office.  There is a little bit of tachycardia.  I will not increase her beta blocker at this time.  May need to do so in the future on followup if her heart rate remains a little elevated.   It is currently 87 beats per minute.  The patient has not yet seen her surgeon for postoperative evaluation.  We will see her again in 3 months unless she is symptomatic.  I will not refer to cardiac rehabilitation and she is undergoing physical therapy at the skilled nursing facility.  She is given a restriction for stool softer to use for when necessary constipation.  She will continue BB, Statin, ASA, and ACE  2.Hypertension: blood pressure is well controlled currently.  We will not make any changes in her medication regimen at this time.  She will continue beta blocker and ACE inhibitor as directed.  Current medicines are reviewed at length with the patient today.    Labs/ tests ordered today include: None No orders of the defined types were placed in this encounter.  Disposition:   FU with  3 months  Signed, Jory Sims, NP  09/14/2014 5:04 PM    [facility] [address] [place], [place] [address] Phone: [phone]; Fax: [phone]

## 2014-09-14 NOTE — Progress Notes (Deleted)
Cardiology Office Note   Date:  09/14/2014   ID:  AVIRA TILLISON, DOB 1926-12-06, MRN [id number]  PCP:  Odette Fraction, MD  Cardiologist:  To be est in [place],  Jory Sims, NP   Chief Complaint  Patient presents with  . Coronary Artery Disease    NSTEMI s/p CABG      History of Present Illness: Misty Edwards is a 81 y.o. female who presents for ongoing assessment and management status post hospitalization where she was admitted for non-ST elevation MI, receiving cardiac catheterization revealing severe multivessel coronary artery disease.  She had emergent cardiothoracic surgical consultation and it was felt that she would require high-risk emergent surgical revascularization.  The patient had coronary artery bypass grafting x3, with LIMA to LAD, SVG to OM, SVG to posterior descending on 08/24/2014.  The patient did not have any postoperative complications or events.  In recovery.  She was treated for some mild volume overload, which responded well to diuretics.  She did not have a postoperative atrial fibrillation or dysrhythmias.  The patient was referred to a short-term nursing facility for physical therapy on discharge.    Past Medical History  Diagnosis Date  . Hypertension   . History of carcinoma in situ of breast 1988  . Acute biliary pancreatitis 07/2002    thia was in 07/2004:she still has pseudocyst in tail of pancreas measuring 54 x 33 mm   . Upper GI bleed September2004    secondary to gastritis  . Diabetes mellitus   . Adrenal adenoma     bilateral  . Detached retina   . Chronic pancreatitis   . Hyperlipidemia   . Osteopenia   . IBS (irritable bowel syndrome)   . Legally blind   . Anxiety   . Osteoarthritis   . Diverticulosis     Past Surgical History  Procedure Laterality Date  . Colonoscopy  08/15/05    few tiny diverticula at sigmoid colon/external hemorrhoids but no polyps  . Right mastectomy    . Ectopic pregnancy surgery  1950's  .  Tacking up of her bladder    . Laparoscopic cholecystectomy    . Ercp with sphincterotomy  07/2002  . Pancreatic pseudocyst drainage  June2004    drained percutaneously   . Esophagogastroduodenoscopy       Gastritis of body.  Otherwise normal  . Colonoscopy  01/08/2012    PPJ:KDTOIZT diverticulosis. Next colonoscopy in 12/2016  . Esophagogastroduodenoscopy  01/08/2012    RMR: Few scattered gastric erosions of uncertain significance-status post biopsy. Minimal chronic inflammation, no H.pylori  . Cardiac catheterization N/A 08/24/2014    Procedure: Left Heart Cath and Coronary Angiography;  Surgeon: Leonie Man, MD;  Location: [facility];  Service: Cardiovascular;  Laterality: N/A;  . Coronary artery bypass graft N/A 08/24/2014    Procedure: CORONARY ARTERY BYPASS GRAFTING (CABG) x three, using left internal mammary artery and right leg greater saphenous vein harvested endoscopically;  Surgeon: Ivin Poot, MD;  Location: [facility];  Service: Open Heart Surgery;  Laterality: N/A;  . Tee without cardioversion N/A 08/24/2014    Procedure: TRANSESOPHAGEAL ECHOCARDIOGRAM (TEE);  Surgeon: Ivin Poot, MD;  Location: [facility];  Service: Open Heart Surgery;  Laterality: N/A;     No current outpatient prescriptions on file.   No current facility-administered medications for this visit.    Allergies:   Calcium-containing compounds; Raloxifene; and Vitamin d analogs    Social History:  The patient  reports that  she has never smoked. She does not have any smokeless tobacco history on file. She reports that she does not drink alcohol or use illicit drugs.   Family History:  The patient's family history includes Colon cancer (age of onset: 80) in her brother; Colon cancer (age of onset: 38) in her father; Heart attack in her brother; Kidney disease in her daughter; Leukemia in her son; Ovarian cancer in her mother; Pancreatitis in her brother.    ROS: .   All other systems are reviewed and  negative.Unless otherwise mentioned in  H&P above.   PHYSICAL EXAM: VS:  There were no vitals taken for this visit. , BMI There is no weight on file to calculate BMI. GEN: Well nourished, well developed, in no acute distress HEENT: normal Neck: no JVD, carotid bruits, or masses Cardiac: ***RRR; no murmurs, rubs, or gallops,no edema  Respiratory:  clear to auscultation bilaterally, normal work of breathing GI: soft, nontender, nondistended, + BS MS: no deformity or atrophy Skin: warm and dry, no rash Neuro:  Strength and sensation are intact Psych: euthymic mood, full affect   EKG:  EKG {ACTION; IS/IS [id number]} ordered today. The ekg ordered today demonstrates ***   Recent Labs: 08/25/2014: Magnesium 2.1 08/31/2014: Hemoglobin 10.5*; Platelets 109* 09/01/2014: ALT 32; BUN 23*; Creatinine, Ser 0.76; Potassium 4.0; Sodium 132*    Lipid Panel    Component Value Date/Time   CHOL 126 01/13/2014 0856   TRIG 112 01/13/2014 0856   TRIG 90 10/15/2011 1538   HDL 56 01/13/2014 0856   CHOLHDL 2.3 01/13/2014 0856   VLDL 22 01/13/2014 0856   LDLCALC 48 01/13/2014 0856      Wt Readings from Last 3 Encounters:  08/31/14 141 lb 12.1 oz (64.3 kg)  08/24/14 140 lb (63.504 kg)  07/28/14 137 lb (62.143 kg)      Other studies Reviewed: Additional studies/ records that were reviewed today include: ***. Review of the above records demonstrates: ***   ASSESSMENT AND PLAN:  1.  ***   Current medicines are reviewed at length with the patient today.    Labs/ tests ordered today include: *** No orders of the defined types were placed in this encounter.     Disposition:   FU with *** in {gen number [id number]} {TIME; UNITS DAY/WEEK/MONTH:19136}   Signed, Jory Sims, NP  09/14/2014 7:41 AM    [facility] [address], [place], [place] [address] Phone: [phone]; Fax: [phone]

## 2014-09-14 NOTE — Patient Instructions (Signed)
Your physician recommends that you schedule a follow-up appointment in: 3 months with Arnold Long NP   Take medications as directed, take Dulcolax stool softener       Thank you for choosing [facility] !

## 2014-09-15 ENCOUNTER — Other Ambulatory Visit (HOSPITAL_COMMUNITY)
Admission: AD | Admit: 2014-09-15 | Discharge: 2014-09-15 | Disposition: A | Discharge status: Home / Self Care | Payer: Medicare HMO | Source: Skilled Nursing Facility | Attending: Internal Medicine | Admitting: Internal Medicine

## 2014-09-15 ENCOUNTER — Non-Acute Institutional Stay (SKILLED_NURSING_FACILITY): Payer: Medicare HMO | Admitting: Internal Medicine

## 2014-09-15 DIAGNOSIS — I5020 Unspecified systolic (congestive) heart failure: Secondary | ICD-10-CM | POA: Insufficient documentation

## 2014-09-15 DIAGNOSIS — I214 Non-ST elevation (NSTEMI) myocardial infarction: Secondary | ICD-10-CM

## 2014-09-15 DIAGNOSIS — E119 Type 2 diabetes mellitus without complications: Secondary | ICD-10-CM | POA: Insufficient documentation

## 2014-09-15 DIAGNOSIS — E785 Hyperlipidemia, unspecified: Secondary | ICD-10-CM | POA: Diagnosis not present

## 2014-09-15 DIAGNOSIS — M6281 Muscle weakness (generalized): Secondary | ICD-10-CM | POA: Diagnosis present

## 2014-09-15 LAB — BASIC METABOLIC PANEL
Anion gap: 8 (ref 5–15)
BUN: 25 mg/dL — ABNORMAL HIGH (ref 6–20)
CALCIUM: 8.7 mg/dL — AB (ref 8.9–10.3)
CO2: 29 mmol/L (ref 22–32)
Chloride: 95 mmol/L — ABNORMAL LOW (ref 101–111)
Creatinine, Ser: 0.79 mg/dL (ref 0.44–1.00)
GFR calc non Af Amer: >60 mL/min (ref >60)
Glucose, Bld: 223 mg/dL — ABNORMAL HIGH (ref 65–99)
POTASSIUM: 4.5 mmol/L (ref 3.5–5.1)
Sodium: 132 mmol/L — ABNORMAL LOW (ref 135–145)

## 2014-09-15 NOTE — Progress Notes (Signed)
Patient ID: Misty Edwards, female   DOB: 03-Jan-1927, 81 y.o.   MRN: [id number]                    PROGRESS NOTE           FACILITY: [facility]                   LEVEL OF CARE:   SNF  This is an acute visit   Acute Visit                             CHIEF COMPLAINT:  Follow-up coronary artery disease status post MI-.    HISTORY OF PRESENT ILLNESS:  This is an 81 year-old  woman who came in with progressive chest pain over 7-9 days before admission.  She was admitted on 08/24/2014.  She had an abnormal EKG and was admitted urgently to the hospital with a non-ST elevation MI.  She was taken to the Cardiac Cath Lab and was found to have severe multi-vessel coronary artery disease, and ultimately was taken to the OR for a CABG x3.   She is here for rehabilitation. Dr. Dellia Nims saw her earlier this week for shortness of breath-clinically she appeared to be stable Dr. Dellia Nims did start her on low-dose Lasix with potassium.  I do not see a recent cardiac echo although appeared been ordered at one point.  She says she is feeling better today does not complain of shortness of breath or chest pain she is sitting comfortably in her care vital signs appear to be stable.  Her weight appears to be stable at 137  Family medical social history reviewed  per admission note on 09/01/2014.  Medications have been reviewed per [facility]   I do note she is on aspirin 81 mg day.  Lasix 20 mg daily potassium 10 mEq a day.  Glucophage extended release 500 mg daily.  Hydralazine 10 mg 3 times a day.  Lopressor 50 mg twice a day.  Novolin N insulin 20 units every morning.  Sliding scale insulin with meals  t.    REVIEW OF SYSTEMS: Gen. patient does not have acute complaints this afternoon    HEENT not complaining of any visual changes or difficulty swallowing:  .   CHEST/RESPIRATORY:  Does not complain of shortness of breath this afternoon.   CARDIAC:  She is not complaining of exertional  chest pain, although she did not tell me that she had chest pain even before this.  She said it was "gas pain"-she appears to have said the same thing to Dr. Dellia Nims a couple days ago-she says it is relieved with burping.   GI:  No nausea, vomiting, or diarrhea.    PHYSICAL EXAMINATION:    Temperature 97.9 pulse 80 respirations 20 blood pressure 137/66 weight is 137 this is actually down 3 pounds since earlier this week GENERAL APPEARANCE:  The patient is not in any distress.  Sitting comfortably in her chair.   CHEST/RESPIRATORY:  Clear air entry bilaterally.    CARDIOVASCULAR:   CARDIAC:  There is a soft midsystolic murmur. She has mild peripheral edema I would say trace.   Labs.  09/15/2014.  Sodium 132 potassium 4.5 BUN 25 creatinine 0.79.   .    ASSESSMENT/PLAN:             Status post non-ST elevation MI and coronary artery bypass grafting x3.--Shortness of  breath appears improved today-she is now on low-dose Lasix appears to be tolerating this well per recent metabolic panel-will update a metabolic panel next week.    Her weight actually appears to be decreased today this will have to be monitored as well but clinically appears stable  CPT-99308     .      Marland Kitchen     CPT CODE: 14239

## 2014-09-17 ENCOUNTER — Encounter: Payer: Self-pay | Admitting: Internal Medicine

## 2014-09-19 LAB — BASIC METABOLIC PANEL
Anion gap: 10 (ref 5–15)
BUN: 21 mg/dL — ABNORMAL HIGH (ref 6–20)
CO2: 28 mmol/L (ref 22–32)
Calcium: 9.2 mg/dL (ref 8.9–10.3)
Chloride: 94 mmol/L — ABNORMAL LOW (ref 101–111)
Creatinine, Ser: 0.84 mg/dL (ref 0.44–1.00)
GFR calc non Af Amer: >60 mL/min (ref >60)
Glucose, Bld: 418 mg/dL — ABNORMAL HIGH (ref 65–99)
POTASSIUM: 4.8 mmol/L (ref 3.5–5.1)
SODIUM: 132 mmol/L — AB (ref 135–145)

## 2014-09-22 ENCOUNTER — Non-Acute Institutional Stay (SKILLED_NURSING_FACILITY): Payer: Medicare HMO | Admitting: Internal Medicine

## 2014-09-22 DIAGNOSIS — K130 Diseases of lips: Secondary | ICD-10-CM | POA: Diagnosis not present

## 2014-09-22 DIAGNOSIS — D473 Essential (hemorrhagic) thrombocythemia: Secondary | ICD-10-CM | POA: Diagnosis not present

## 2014-09-22 DIAGNOSIS — I214 Non-ST elevation (NSTEMI) myocardial infarction: Secondary | ICD-10-CM | POA: Diagnosis not present

## 2014-09-22 DIAGNOSIS — K648 Other hemorrhoids: Secondary | ICD-10-CM | POA: Diagnosis not present

## 2014-09-22 DIAGNOSIS — K644 Residual hemorrhoidal skin tags: Secondary | ICD-10-CM

## 2014-09-25 ENCOUNTER — Encounter (HOSPITAL_COMMUNITY)
Admission: RE | Admit: 2014-09-25 | Discharge: 2014-09-25 | Disposition: A | Discharge status: Home / Self Care | Payer: Medicare HMO | Source: Skilled Nursing Facility | Attending: Internal Medicine | Admitting: Internal Medicine

## 2014-09-25 ENCOUNTER — Encounter: Payer: Self-pay | Admitting: Internal Medicine

## 2014-09-25 DIAGNOSIS — K130 Diseases of lips: Secondary | ICD-10-CM | POA: Insufficient documentation

## 2014-09-25 LAB — CBC WITH DIFFERENTIAL/PLATELET
BASOS PCT: 0 % (ref 0–1)
Basophils Absolute: 0 10*3/uL (ref 0.0–0.1)
EOS ABS: 0.2 10*3/uL (ref 0.0–0.7)
Eosinophils Relative: 5 % (ref 0–5)
HEMATOCRIT: 27.6 % — AB (ref 36.0–46.0)
HEMOGLOBIN: 9.1 g/dL — AB (ref 12.0–15.0)
LYMPHS ABS: 0.8 10*3/uL (ref 0.7–4.0)
Lymphocytes Relative: 19 % (ref 12–46)
MCH: 27.5 pg (ref 26.0–34.0)
MCHC: 33 g/dL (ref 30.0–36.0)
MCV: 83.4 fL (ref 78.0–100.0)
MONOS PCT: 10 % (ref 3–12)
Monocytes Absolute: 0.4 10*3/uL (ref 0.1–1.0)
NEUTROS PCT: 66 % (ref 43–77)
Neutro Abs: 3 10*3/uL (ref 1.7–7.7)
Platelets: 117 10*3/uL — ABNORMAL LOW (ref 150–400)
RBC: 3.31 MIL/uL — AB (ref 3.87–5.11)
RDW: 15.4 % (ref 11.5–15.5)
Smear Review: DECREASED
WBC: 4.6 10*3/uL (ref 4.0–10.5)

## 2014-09-25 LAB — BASIC METABOLIC PANEL
Anion gap: 9 (ref 5–15)
BUN: 31 mg/dL — ABNORMAL HIGH (ref 6–20)
CALCIUM: 8.7 mg/dL — AB (ref 8.9–10.3)
CHLORIDE: 94 mmol/L — AB (ref 101–111)
CO2: 27 mmol/L (ref 22–32)
Creatinine, Ser: 0.89 mg/dL (ref 0.44–1.00)
GFR calc Af Amer: >60 mL/min (ref >60)
GFR calc non Af Amer: 57 mL/min — ABNORMAL LOW (ref >60)
Glucose, Bld: 275 mg/dL — ABNORMAL HIGH (ref 65–99)
Potassium: 4.7 mmol/L (ref 3.5–5.1)
Sodium: 130 mmol/L — ABNORMAL LOW (ref 135–145)

## 2014-09-25 NOTE — Progress Notes (Signed)
Patient ID: Misty Edwards, female   DOB: 16-Nov-1926, 81 y.o.   MRN: [id number]                     PROGRESS NOTE           FACILITY: [facility]                   LEVEL OF CARE:   SNF  This is an acute visit   Acute Visit                             CHIEF COMPLAINT:  Follow-up coronary artery disease status post MI-. Lip lesion-complaints of hemorrhoid discomfort-    HISTORY OF PRESENT ILLNESS:  This is an 81 year-old  woman who came in with progressive chest pain over 7-9 days before admission.  She was admitted on 08/24/2014.  She had an abnormal EKG and was admitted urgently to the hospital with a non-ST elevation MI.  She was taken to the Cardiac Cath Lab and was found to have severe multi-vessel coronary artery disease, and ultimately was taken to the OR for a CABG x3.   She is here for rehabilitation. Dr. Dellia Nims saw her  for shortness of breath-clinically she appeared to be stable Dr. Dellia Nims did start her on low-dose Lasix with potassium.  I do not see a recent cardiac echo although appeared been ordered at one point.   She continues to be stable in this regard initially complain of shortness of breath when she saw Dr. Dellia Nims but does not complain of this currently-does not complain of chest pain she is sitting in her chair comfortably this afternoon vital signs appear to be stable-her weight actually appears to be down a bit most recently 135.2 on June 27 was 140.  e patient does have a small sore on her left lower lip inside on the mucous membrane-she says it's just a little sore at times denies having any trauma or biting in this area.  She also has a history of chronic hemorrhoids and would like something for some relief  Family medical social history reviewed  per admission note on 09/01/2014.  Medications have been reviewed per [facility]   I do note she is on aspirin 81 mg day.  Lasix 20 mg daily potassium 10 mEq a day.  Glucophage extended release 500 mg  daily.  Hydralazine 10 mg 3 times a day.  Lopressor 50 mg twice a day.  Novolin N insulin 20 units every morning.  Sliding scale insulin with meals  t.    REVIEW OF SYSTEMS: Gen. Does not complain of fever or chills   HEENT not complaining of any visual changes or difficulty swallowing: Does complain of a small left lower lip sore-  .   CHEST/RESPIRATORY:  Does not complain of shortness of breath this afternoon.   CARDIAC:  She is not complaining of exertional chest pain,  .   GI:  No nausea, vomiting, or diarrhea. Rectal does complain of some hemorrhoid discomfort apparently has a history of chronic hemorrhoids per patient and family.  Musculoskeletal does not complain of joint pain does have some weakness.  Neurologic does not complain of dizziness or syncopal-type episodes or headache      PHYSICAL EXAMINATION:     Temperature 98.2 pulse 80 respirations 18 blood pressure 115/54 O2 saturation is in the 90s weight is 135.2 GENERAL APPEARANCE:  The  patient is not in any distress.  Sitting comfortably in her chair Her skin is warm and dry.  Head ears eyes nose mouth and throat-visual acuity appears intact oropharynx clear mucous membranes moist left lower lip inside-- on the mucous membrane there is a small almost blister appearing area there is no drainage bleeding some tenderness to palpation.   CHEST/RESPIRATORY:  Clear air entry bilaterally.    CARDIOVASCULAR:   CARDIAC:  There is a soft midsystolic murmur. She has mild peripheral edema I would say trace Muscle skeletal moves all extremities appears at baseline.  Neurologic could not really see any lateralizing findings her speech is clear cranial nerves grossly intact.  .   Labs  09/19/2014.  Sodium 132 potassium 4.8 BUN 21 creatinine 0.84.  Marland Kitchen  09/15/2014.  Sodium 132 potassium 4.5 BUN 25 creatinine 0.79.  08/31/2014.  WBC 6.0 hemoglobin 10.5 platelets 109.     .    ASSESSMENT/PLAN:              Status post non-ST elevation MI and coronary artery bypass grafting x3.--Shortness of breath appears improved -she is now on low-dose Lasix appears to be tolerating this well per recent metabolic panel-will update a metabolic panel--her weight has been stable to slowly decreasing.    #2-history of left lip lesion-will treat with Orajel for now but will have to be watched closely if this does not resolve consider an anti-viral.    #3 history of thrombocytopenia this appears to be somewhat chronic per chart review will update a CBC next laboratory day.    #4 hemorrhoid pain which apparently is chronic will add Prn Anusol cream 3 times a day.    OPF-[id number]         .      Marland Kitchen     CPT CODE: 62863

## 2014-09-26 ENCOUNTER — Encounter: Payer: Self-pay | Admitting: Internal Medicine

## 2014-09-26 ENCOUNTER — Other Ambulatory Visit: Payer: Self-pay | Admitting: Cardiothoracic Surgery

## 2014-09-26 ENCOUNTER — Non-Acute Institutional Stay (SKILLED_NURSING_FACILITY): Payer: Medicare HMO | Admitting: Internal Medicine

## 2014-09-26 DIAGNOSIS — R059 Cough, unspecified: Secondary | ICD-10-CM

## 2014-09-26 DIAGNOSIS — D649 Anemia, unspecified: Secondary | ICD-10-CM

## 2014-09-26 DIAGNOSIS — R05 Cough: Secondary | ICD-10-CM

## 2014-09-26 DIAGNOSIS — Z951 Presence of aortocoronary bypass graft: Secondary | ICD-10-CM

## 2014-09-26 DIAGNOSIS — I214 Non-ST elevation (NSTEMI) myocardial infarction: Secondary | ICD-10-CM

## 2014-09-26 DIAGNOSIS — E119 Type 2 diabetes mellitus without complications: Secondary | ICD-10-CM

## 2014-09-26 DIAGNOSIS — Z794 Long term (current) use of insulin: Secondary | ICD-10-CM | POA: Diagnosis not present

## 2014-09-26 NOTE — Progress Notes (Signed)
Patient ID: Misty Edwards, female   DOB: Sep 19, 1926, 81 y.o.   MRN: [id number]                      PROGRESS NOTE           FACILITY: [facility]                   LEVEL OF CARE:   SNF  This is an acute visit   Acute Visit                             CHIEF COMPLAINT:   Acute visit follow-acute visit secondary to worsening anemia-follow-up diabetes--CAD-    HISTORY OF PRESENT ILLNESS:  This is an 81 year-old  woman who came in with progressive chest pain over 7-9 days before admission.  She was admitted on 08/24/2014.  She had an abnormal EKG and was admitted urgently to the hospital with a non-ST elevation MI.  She was taken to the Cardiac Cath Lab and was found to have severe multi-vessel coronary artery disease, and ultimately was taken to the OR for a CABG x3.   She is here for rehabilitation. Dr. Dellia Nims saw her  for shortness of breath-clinically she appeared to be stable Dr. Dellia Nims did start her on low-dose Lasix with potassium.    Labs ordered this week does show a hemoglobin of 9.1 this is normocytic this appears to be lower than her baseline which appears to be more in the 10--11 range most recently 10.5  bit above 11-- 4 weeks ago  Per chart review it appears her thrombocytopenia is chronic, most recently 117,000 which appears to be relatively baseline  She does have a history of chronic pancreatitis is on Creon and has been followed closely by GI it appears-per extensive review of her notes it appears she had a colonoscopy in October 2013 which was not very remarkable-biopsies were unremarkable did show some colonic diverticulosis and a few gastric erosions.  She does have a history of upper GI bleed secondary to gastritis this is listed as occurring in 2014.  She does continue on Nexium.  GI has noted a family history of colon cancer and colonic polyps  Patient also has a history of type 2 diabetes apparently she is eating fairly well and her blood sugars  appear to be somewhat elevated lately in the 2 300 range in the morning and this continues into the later day parts as well there is some variability occasionally there'll be a reading in the lower mid 100s but these are quite infrequent.  She is on Novolin N 20 units in the morning as well as Glucophage 500 mg every morning  Family medical social history reviewed  per admission note on 09/01/2014.  Medications have been reviewed per [facility]   I do note she is on aspirin 81 mg day.  Lasix 20 mg daily potassium 10 mEq a day.  Glucophage extended release 500 mg daily.  Hydralazine 10 mg 3 times a day.  Lopressor 50 mg twice a day.  Novolin N insulin 20 units every morning.  Sliding scale insulin with meals  t.    REVIEW OF SYSTEMS: Gen. Does not complain of fever or chills  Skin does not complain of rashes or itching  HEENT not complaining of any visual changes or difficulty swallowing: .   CHEST/RESPIRATORY:  Does not complain of shortness of  breath this afternoon. --Says she has an intermittent dry cough this is been going on for over a week  CARDIAC:  She is not complaining of exertional chest pain,  .   GI:  No nausea, vomiting, or diarrhea. Rectal does complain of some hemorrhoid discomfort apparently has a history of chronic hemorrhoids per patient and family.  Musculoskeletal does not complain of joint pain does have some weakness.  Neurologic does not complain of dizziness or syncopal-type episodes or headache  Psych does not really complain of any depression or anxiety appears to be in good spirits      PHYSICAL EXAMINATION:     Temperature 97.5 pulse 90 respirations 18 blood pressure 139/69 weight is 134 GENERAL APPEARANCE:  The patient is not in any distress.  Sitting comfortably in her chair Her skin is warm and dry.  Head ears eyes nose mouth and throat-visual acuity appears intact oropharynx clear mucous membranes moist  .   CHEST/RESPIRATORY:  Clear air  entry bilaterally. There is no labored breathing    CARDIOVASCULAR:   CARDIAC:  There is a soft midsystolic murmur. She has mild peripheral edema I would say trace Muscle skeletal moves all extremities appears at baseline she is able to stand and use a walker although is unsteady.  Rectal-I did appreciate an external hemorrhoid I did test for occult bleeding and this was negative  Neurologic could not really see any lateralizing findings her speech is clear cranial nerves grossly intact.   Psych appears grossly alert and oriented pleasant and appropriate    .   Labs  09/25/2014.  WBC 4.6 hemoglobin 9.1 platelets 117.  Sodium 1:30 potassium 4.7 BUN 31 creatinine 0.89  09/19/2014.  Sodium 132 potassium 4.8 BUN 21 creatinine 0.84.  Marland Kitchen  09/15/2014.  Sodium 132 potassium 4.5 BUN 25 creatinine 0.79.  08/31/2014.  WBC 6.0 hemoglobin 10.5 platelets 109.     .    ASSESSMENT/PLAN  Anemia-per chart review it appears her hemoglobin has dropped over a point from her baseline-again stool testing today was negative for occult bleeding-we will order guaiac stools 3-also update a CBC iron studies reticulocyte count and ferritin level later this week clinically she appears to be stable:  Diabetes type 2-she does appear to have some fairly consistently elevated blood sugars will increase Novolin N to 23 units every morning-consider changing dosing  to twice a day but will discuss this with Dr. Dellia Nims              Status post non-ST elevation MI and coronary artery bypass grafting x3.--Shortness of breath appears improved -she is now on low-dose Lasix appears to be tolerating this well per recent metabolic panel---her weight has been stable to slowly decreasing.  She is on aspirin as well as a statin beta blocker and ACE inhibitor-she has been seen by cardiology as recently as 09/14/2014 per review of note  Dry cough-physical exam was quite benign apparently this is been somewhat of a  chronic cough will order Mucinex 600 mg twice a day for 5 days to see if we can make this productive continue to monitor clinically respiratory wise she appears to be stable.  I do not tshe is on an ACE inhibitor if this persists suspect we will have to look at this--although it appears she has been on the ACE inhibitor for some time--I note she was on ACE before her recent coronary event per chart review      history of thrombocytopenia this appears to  be somewhat chronic per chart review will update a CBC later this week.  History of hyponatremia this appears to be chronic as well sodium is 1:30 on lab today this will need to be rechecked later this week as well    hemorrhoid pain which apparently is chronic Prn Anusol cream 3 times a day. Appears to be helping   CPT-99310-of note greater than 40 minutes spent assessing patient extensive review of her chart as noted above- coordinating and formulating a plan of care for numerous diagnoses-of note greater than 50% of time spent coordinating plan of care with review of chart playing a role in this       .      Marland Kitchen     CPT CODE: [id number]

## 2014-09-27 ENCOUNTER — Encounter: Payer: Self-pay | Admitting: Cardiothoracic Surgery

## 2014-09-27 ENCOUNTER — Ambulatory Visit (INDEPENDENT_AMBULATORY_CARE_PROVIDER_SITE_OTHER): Payer: Self-pay | Admitting: Cardiothoracic Surgery

## 2014-09-27 ENCOUNTER — Inpatient Hospital Stay
Admit: 2014-09-27 | Discharge: 2014-09-27 | Disposition: A | Discharge status: Home / Self Care | Payer: Medicare HMO | Attending: Cardiothoracic Surgery | Admitting: Cardiothoracic Surgery

## 2014-09-27 VITALS — BP 155/74 | HR 100 | Resp 20 | Ht 63.0 in | Wt 134.0 lb

## 2014-09-27 DIAGNOSIS — Z951 Presence of aortocoronary bypass graft: Secondary | ICD-10-CM

## 2014-09-27 DIAGNOSIS — I2102 ST elevation (STEMI) myocardial infarction involving left anterior descending coronary artery: Secondary | ICD-10-CM

## 2014-09-27 DIAGNOSIS — I5021 Acute systolic (congestive) heart failure: Secondary | ICD-10-CM

## 2014-09-27 DIAGNOSIS — I2510 Atherosclerotic heart disease of native coronary artery without angina pectoris: Secondary | ICD-10-CM

## 2014-09-27 LAB — OCCULT BLOOD X 1 CARD TO LAB, STOOL
FECAL OCCULT BLD: NEGATIVE
Fecal Occult Bld: NEGATIVE

## 2014-09-27 NOTE — Progress Notes (Signed)
PCP is Odette Fraction, MD Referring Provider is Debara Pickett Nadean Corwin, MD  Chief Complaint  Patient presents with  . Routine Post Op    f/u from surgery witb, s/p Emergency coronary artery bypass grafting x3 08/24/2014    QJJ:HERDEYC returns for routine followup after urgent CABG x3. She is a fragile 81 year old female who presented with unstable angina and positive cardiac enzymes. Postoperatively she did well and was discharged to a [facility]. She's getting stronger and ambulating longer distances.her incisions are healing well. She has had no recurrent angina or symptoms of CHF. She is seen her cardiology office since discharge.  Chest x-ray taken today shows clear lung fields, no significant pleural effusion. Past Medical History  Diagnosis Date  . Hypertension   . History of carcinoma in situ of breast 1988  . Acute biliary pancreatitis 07/2002    thia was in 07/2004:she still has pseudocyst in tail of pancreas measuring 54 x 33 mm   . Upper GI bleed September2004    secondary to gastritis  . Diabetes mellitus   . Adrenal adenoma     bilateral  . Detached retina   . Chronic pancreatitis   . Hyperlipidemia   . Osteopenia   . IBS (irritable bowel syndrome)   . Legally blind   . Anxiety   . Osteoarthritis   . Diverticulosis     Past Surgical History  Procedure Laterality Date  . Colonoscopy  08/15/05    few tiny diverticula at sigmoid colon/external hemorrhoids but no polyps  . Right mastectomy    . Ectopic pregnancy surgery  1950's  . Tacking up of her bladder    . Laparoscopic cholecystectomy    . Ercp with sphincterotomy  07/2002  . Pancreatic pseudocyst drainage  June2004    drained percutaneously   . Esophagogastroduodenoscopy       Gastritis of body.  Otherwise normal  . Colonoscopy  01/08/2012    XKG:YJEHUDJ diverticulosis. Next colonoscopy in 12/2016  . Esophagogastroduodenoscopy  01/08/2012    RMR: Few scattered gastric erosions of  uncertain significance-status post biopsy. Minimal chronic inflammation, no H.pylori  . Cardiac catheterization N/A 08/24/2014    Procedure: Left Heart Cath and Coronary Angiography;  Surgeon: Leonie Man, MD;  Location: [place] [facility];  Service: Cardiovascular;  Laterality: N/A;  . Coronary artery bypass graft N/A 08/24/2014    Procedure: CORONARY ARTERY BYPASS GRAFTING (CABG) x three, using left internal mammary artery and right leg greater saphenous vein harvested endoscopically;  Surgeon: Ivin Poot, MD;  Location: [place];  Service: Open Heart Surgery;  Laterality: N/A;  . Tee without cardioversion N/A 08/24/2014    Procedure: TRANSESOPHAGEAL ECHOCARDIOGRAM (TEE);  Surgeon: Ivin Poot, MD;  Location: [place];  Service: Open Heart Surgery;  Laterality: N/A;    Family History  Problem Relation Age of Onset  . Colon cancer Father 91    passed away in his 19's  . Colon cancer Brother 34    surgery inhis 50's doing well now  . Heart attack Brother     passed away age 44  . Pancreatitis Brother   . Ovarian cancer Mother     passed  . Kidney disease Daughter   . Leukemia Son     Social History History  Substance Use Topics  . Smoking status: Never Smoker   . Smokeless tobacco: Not on file  . Alcohol Use: No    No current outpatient prescriptions on file.   No current  facility-administered medications for this visit.    Allergies  Allergen Reactions  . Calcium-Containing Compounds Nausea Only  . Raloxifene Itching    Face and eyes burning  . Vitamin D Analogs Nausea Only    Review of Systems  Improved appetite and strength No fever or edema or weight loss  BP 155/74 mmHg  Pulse 100  Resp 20  Ht 5\' 3"  (1.6 m)  Wt 134 lb (60.782 kg)  BMI 23.74 kg/m2  SpO2 97% Physical Exam Alert and comfortable Lungs clear Sternum stable well-healed Heart rate regular Leg incisions healing  Diagnostic Tests: Chest x-ray clear  Impression: Patient angulated form in  the hallway approximately 40 feet and was somewhat unsteady. I believe that she would benefit from another 10-14 days at the rehabilitation center [facility] center. No change in medications at this visit.  Plan:return for review of progress in 6-8 weeks.    Len Childs, MD [facility] [phone]

## 2014-09-28 LAB — FERRITIN: Ferritin: 72 ng/mL (ref 11–307)

## 2014-09-28 LAB — BASIC METABOLIC PANEL
ANION GAP: 11 (ref 5–15)
BUN: 26 mg/dL — AB (ref 6–20)
CHLORIDE: 96 mmol/L — AB (ref 101–111)
CO2: 25 mmol/L (ref 22–32)
Calcium: 9.1 mg/dL (ref 8.9–10.3)
Creatinine, Ser: 0.9 mg/dL (ref 0.44–1.00)
GFR calc Af Amer: >60 mL/min (ref >60)
GFR, EST NON AFRICAN AMERICAN: 57 mL/min — AB (ref >60)
Glucose, Bld: 334 mg/dL — ABNORMAL HIGH (ref 65–99)
POTASSIUM: 4.7 mmol/L (ref 3.5–5.1)
SODIUM: 132 mmol/L — AB (ref 135–145)

## 2014-09-28 LAB — CBC WITH DIFFERENTIAL/PLATELET
Basophils Absolute: 0 10*3/uL (ref 0.0–0.1)
Basophils Relative: 0 % (ref 0–1)
Eosinophils Absolute: 0.2 10*3/uL (ref 0.0–0.7)
Eosinophils Relative: 4 % (ref 0–5)
HEMATOCRIT: 30.3 % — AB (ref 36.0–46.0)
Hemoglobin: 10 g/dL — ABNORMAL LOW (ref 12.0–15.0)
Lymphocytes Relative: 25 % (ref 12–46)
Lymphs Abs: 1 10*3/uL (ref 0.7–4.0)
MCH: 27.4 pg (ref 26.0–34.0)
MCHC: 33 g/dL (ref 30.0–36.0)
MCV: 83 fL (ref 78.0–100.0)
MONO ABS: 0.3 10*3/uL (ref 0.1–1.0)
Monocytes Relative: 7 % (ref 3–12)
NEUTROS ABS: 2.6 10*3/uL (ref 1.7–7.7)
Neutrophils Relative %: 64 % (ref 43–77)
PLATELETS: 135 10*3/uL — AB (ref 150–400)
RBC: 3.65 MIL/uL — ABNORMAL LOW (ref 3.87–5.11)
RDW: 15 % (ref 11.5–15.5)
WBC: 4 10*3/uL (ref 4.0–10.5)

## 2014-09-28 LAB — RETICULOCYTES
RBC.: 3.65 MIL/uL — ABNORMAL LOW (ref 3.87–5.11)
RETIC COUNT ABSOLUTE: 69.4 10*3/uL (ref 19.0–186.0)
Retic Ct Pct: 1.9 % (ref 0.4–3.1)

## 2014-09-28 LAB — IRON AND TIBC
IRON: 39 ug/dL (ref 28–170)
SATURATION RATIOS: 10 % — AB (ref 10.4–31.8)
TIBC: 379 ug/dL (ref 250–450)
UIBC: 340 ug/dL

## 2014-09-28 LAB — OCCULT BLOOD X 1 CARD TO LAB, STOOL: FECAL OCCULT BLD: NEGATIVE

## 2014-10-02 ENCOUNTER — Other Ambulatory Visit: Payer: Self-pay | Admitting: *Deleted

## 2014-10-02 MED ORDER — OXYCODONE HCL 5 MG PO TABS: ORAL_TABLET | ORAL | Status: DC

## 2014-10-02 NOTE — Telephone Encounter (Signed)
[facility] 

## 2014-10-04 ENCOUNTER — Other Ambulatory Visit: Payer: Self-pay | Admitting: Family Medicine

## 2014-10-09 LAB — HEMOGLOBIN AND HEMATOCRIT, BLOOD
HEMATOCRIT: 31 % — AB (ref 36.0–46.0)
Hemoglobin: 10.2 g/dL — ABNORMAL LOW (ref 12.0–15.0)

## 2014-10-10 LAB — HEMOGLOBIN A1C
HEMOGLOBIN A1C: 7.9 % — AB (ref 4.8–5.6)
Mean Plasma Glucose: 180 mg/dL

## 2014-10-11 ENCOUNTER — Non-Acute Institutional Stay (SKILLED_NURSING_FACILITY): Payer: Medicare HMO | Admitting: Internal Medicine

## 2014-10-11 ENCOUNTER — Encounter: Payer: Self-pay | Admitting: Internal Medicine

## 2014-10-11 DIAGNOSIS — I214 Non-ST elevation (NSTEMI) myocardial infarction: Secondary | ICD-10-CM

## 2014-10-11 DIAGNOSIS — Z951 Presence of aortocoronary bypass graft: Secondary | ICD-10-CM

## 2014-10-11 DIAGNOSIS — D649 Anemia, unspecified: Secondary | ICD-10-CM | POA: Diagnosis not present

## 2014-10-11 DIAGNOSIS — E119 Type 2 diabetes mellitus without complications: Secondary | ICD-10-CM

## 2014-10-11 DIAGNOSIS — Z794 Long term (current) use of insulin: Secondary | ICD-10-CM

## 2014-10-11 NOTE — Progress Notes (Signed)
Patient ID: Misty Edwards, female   DOB: 11-Feb-1927, 81 y.o.   MRN: [id number]                       PROGRESS NOTE           FACILITY: [facility]                   LEVEL OF CARE:   SNF  This is a discharge note                               CHIEF COMPLAINT:   Discharge note-    HISTORY OF PRESENT ILLNESS:  This is an 81 year-old  woman who came in with progressive chest pain over 7-9 days before admission.  She was admitted on 08/24/2014.  She had an abnormal EKG and was admitted urgently to the hospital with a non-ST elevation MI.  She was taken to the Cardiac Cath Lab and was found to have severe multi-vessel coronary artery disease, and ultimately was taken to the OR for a CABG x3.   She is here for rehabilitation. Dr. Dellia Nims saw her  for shortness of breath-clinically she appeared to be stable Dr. Dellia Nims did start her on low-dose Lasix with potassium. She saw cardiology earlier this month and thought to be doing well although thought she was still weak and would benefit from continued therapy and skilled nursing which she has completed-she is ambulating fairly well with a rolling walker    She does have a history of anemia I suspect chronic this has been relatively stable with some fluctuations here most recent hemoglobin 10.0 on July 14 shows relative stability platelets also add 135,000 appears to be relatively baseline we did order reticulocyte count which was within normal range at 1.9 ferritin and total iron binding capacity also was within normal range her iron was low normal at 39.    She does have a history of chronic pancreatitis is on Creon and has been followed closely by GI it appears-per extensive review of her notes it appears she had a colonoscopy in October 2013 which was not very remarkable-biopsies were unremarkable did show some colonic diverticulosis and a few gastric erosions.  She does have a history of upper GI bleed secondary to gastritis  this is listed as occurring in 2014.  She does continue on Prilosec  GI has noted a family history of colon cancer and colonic polyps  Patient also has a history of type 2 diabetes apparently she is eating fairly well and her blood sugars  Continued to be somewhat elevated recent blood sugars in the morning in the 2 300 range at noon ranging from higher 100s to 300.  At 5 PM also some variability from the mid 100s to occasionally over 400 although there are not consistent elevationsthat high.  She is on Humulin flex pen 23 units this was recently increased-I suspect she would tolerate a greater increase but since she is about to go home would be hesitant to make medication adjustments-expedient follow-up has been arranged for primary care provider to look at this   clinically she is doing well.   Family medical social history reviewed  per admission note on 79/17/2016.  Medications have been reviewed per [facility]   I do note she is on aspirin 81 mg day.  Lasix 20 mg daily potassium 10 mEq a day.  Glucophage extended release 500 mg daily.  Hydralazine 10 mg 3 times a day.  Lopressor 50 mg twice a day.  Humulin flex pen 23 units every morning.  Sliding scale insulin with meals  t.    REVIEW OF SYSTEMS: Gen. Does not complain of fever or chills  Skin does not complain of rashes or itching  HEENT not complaining of any visual changes or difficulty swallowing: .   CHEST/RESPIRATORY:  Does not complain of shortness of breath this afternoon. --Says she has an intermittent dry cough this has been somewhat chronic  CARDIAC:  She is not complaining of exertional chest pain,  .   GI:  No nausea, vomiting, or diarrhea.  .  Musculoskeletal does not complain of joint pain does have some weakness. But is ambulating better-now using a walker apparently is walking without assistance at times when she's been encouraged to use her walker certainly to avoid fall risk  Neurologic does not  complain of dizziness or syncopal-type episodes or headache  Psych does not really complain of any depression or anxiety appears to be in good spirits      PHYSICAL EXAMINATION:    Temperature 98.2 pulse 67 respirations 20 blood pressure 108/62-140/63 in this range weight is relatively stable at 131.8 GENERAL APPEARANCE:  The patient is not in any distress.  Sitting comfortably in her chair Her skin is warm and dry.  Head ears eyes nose mouth and throat-visual acuity appears intact oropharynx clear mucous membranes moist  .   CHEST/RESPIRATORY:  Clear air entry bilaterally. There is no labored breathing    CARDIOVASCULAR:   CARDIAC:  There is a soft midsystolic murmur. She has mild peripheral edema I would say trace Muscle skeletal moves all extremities appears at baseline she is able to stand and use a walker --she walked a few steps for me without the walker and was quite unsteady.   Neurologic could not really see any lateralizing findings her speech is clear cranial nerves grossly intact.   Psych appears grossly alert and oriented pleasant and appropriate    .   Labs  10/09/2014.  Hemoglobin A1c 7.9.  Hemoglobin 10.2.  09/28/2014.  WBC 4.0 hemoglobin 10.0 platelets 135.  Reticulocyte count 1.9-ferritin 72-total iron binding capacity 379-serum iron 39  09/25/2014.  WBC 4.6 hemoglobin 9.1 platelets 117.  Sodium 1:30 potassium 4.7 BUN 31 creatinine 0.89  09/19/2014.  Sodium 132 potassium 4.8 BUN 21 creatinine 0.84.  Marland Kitchen  09/15/2014.  Sodium 132 potassium 4.5 BUN 25 creatinine 0.79.  08/31/2014.  WBC 6.0 hemoglobin 10.5 platelets 109.     .    ASSESSMENT/PLAN  Anemia- This appears to be relatively stable with a hemoglobin of 10.2 earlier this week last platelet count 1 35,000 earlier this month show stability-will defer follow-up to primary care provider-her occult blood testing has been negative while here  Diabetes type 2-s Continues to have  some elevations-but would be has been as noted above to change medication before discharge-expedient primary care follow-up has been arranged nursing also has discussed this with her family today and they are comfortable with this plan              Status post non-ST elevation MI and coronary artery bypass grafting x3.--Shortness of breath appears improved -she is now on low-dose Lasix appears to be tolerating this well per recent metabolic panel---her weight has been stable to slowly decreasing--will update a BMP before discharge.  She is on aspirin as well as a statin  beta blocker and ACE inhibitor-she has been seen by cardiology    Dry cough-she says this is somewhat intermittent-chronic will defer to primary care provider she is on an ACE inhibitor-this does not appear to be ianacute issue no shortness of breath apparently intermittent   Hypertension this appears to be relatively stable as noted above she is on numerous medications including hydralazine 10 mg 3 times a day-Lopressor 50 mg twice a day and Vasotec 20 mg daily  History of pancreatic insufficiency continues on Creon this is not really been an issue during her stay hehas      history of thrombocytopenia this appears to be somewhat chronic per chart review recent labs show stability  History of hyponatremia this appears to be chronic as well --with sodium usually running in the low 130s will update this again before discharge   Patient will be home with family-she will need a rolling walker for ambulation secondary to fall with weakness-clinically has done well during her stay here her blood sugars will have to be monitored closely with expedient follow-up as noted above.  [id number] note greater than 30 minutes and ent on this discharge summary including reviewing chart-discussing her status with nursing staff-and coordinating and formulating a plan of care-of note greater than 50% of time spent coordinating plan of  care           .

## 2014-10-16 ENCOUNTER — Other Ambulatory Visit: Payer: Medicare HMO

## 2014-10-17 ENCOUNTER — Encounter (HOSPITAL_COMMUNITY)
Admission: RE | Admit: 2014-10-17 | Discharge: 2014-10-17 | Disposition: A | Discharge status: Home / Self Care | Payer: Medicare HMO | Source: Ambulatory Visit | Attending: Cardiovascular Disease | Admitting: Cardiovascular Disease

## 2014-10-17 VITALS — BP 122/60 | HR 75 | Ht 63.0 in | Wt 131.3 lb

## 2014-10-17 DIAGNOSIS — I252 Old myocardial infarction: Secondary | ICD-10-CM | POA: Insufficient documentation

## 2014-10-17 DIAGNOSIS — I214 Non-ST elevation (NSTEMI) myocardial infarction: Secondary | ICD-10-CM

## 2014-10-17 DIAGNOSIS — Z951 Presence of aortocoronary bypass graft: Secondary | ICD-10-CM

## 2014-10-17 NOTE — Progress Notes (Signed)
Patient arrived for 1st visit/orientation/education at 10:00am. Patient was referred to CR by Dr. Quay Burow due to CABGx3 Z95.1 and NSTEMI I21.4. During orientation advised patient on arrival and appointment times what to wear, what to do before, during and after exercise. Reviewed attendance and class policy. Talked about inclement weather and class consultation policy. Pt is scheduled to return Cardiac Rehab on 10/23/14 at 8:15 am. Pt was advised to come to class 5 minutes before class starts. He was also given instructions on meeting with the dietician and attending the Family Structure classes. Pt is eager to get started. Patient was able to complete 6 minute walk test. Patient was measured for the equipment. Discussed equipment safety with patient. Took patient pre-anthropometric measurements. Patient finished visit at 1:45pm.

## 2014-10-17 NOTE — Progress Notes (Signed)
Cardiac/Pulmonary Rehab Medication Review by a Pharmacist  Does the patient  feel that his/her medications are working for him/her?  yes  Has the patient been experiencing any side effects to the medications prescribed?  no  Does the patient measure his/her own blood pressure or blood glucose at home?  yes   Does the patient have any problems obtaining medications due to transportation or finances?   no  Understanding of regimen: good Understanding of indications: good Potential of compliance: excellent  Questions asked to Determine Patient Understanding of Medication Regimen:  1. What is the name of the medication?  2. What is the medication used for?  3. When should it be taken?  4. How much should be taken?  5. How will you take it?  6. What side effects should you report?  Understanding Defined as: Excellent: All questions above are correct Good: Questions 1-4 are correct Fair: Questions 1-2 are correct  Poor: 1 or none of the above questions are correct   Pharmacist comments: Pt does check BP and CBG at home.  We talked about keeping a journal of BP to show to MD at each appt.  Pt does not have any problems obtaining medications.  Sugars have been running high, pt has appt on Thursday and thinks Novolin N dose may need to be increased.    Hart Robinsons A 10/17/2014 11:10 AM

## 2014-10-18 ENCOUNTER — Other Ambulatory Visit: Payer: Medicare HMO

## 2014-10-18 DIAGNOSIS — Z79899 Other long term (current) drug therapy: Secondary | ICD-10-CM

## 2014-10-18 DIAGNOSIS — E1165 Type 2 diabetes mellitus with hyperglycemia: Secondary | ICD-10-CM

## 2014-10-18 DIAGNOSIS — I10 Essential (primary) hypertension: Secondary | ICD-10-CM

## 2014-10-18 DIAGNOSIS — IMO0002 Reserved for concepts with insufficient information to code with codable children: Secondary | ICD-10-CM

## 2014-10-18 LAB — LIPID PANEL
CHOL/HDL RATIO: 1.5 ratio (ref <=5.0)
Cholesterol: 80 mg/dL — ABNORMAL LOW (ref 125–200)
HDL: 54 mg/dL (ref >=46)
LDL Cholesterol: 17 mg/dL (ref <130)
TRIGLYCERIDES: 47 mg/dL (ref <150)
VLDL: 9 mg/dL (ref <30)

## 2014-10-18 LAB — CBC WITH DIFFERENTIAL/PLATELET
Basophils Absolute: 0 10*3/uL (ref 0.0–0.1)
Basophils Relative: 0 % (ref 0–1)
Eosinophils Absolute: 0.1 10*3/uL (ref 0.0–0.7)
Eosinophils Relative: 2 % (ref 0–5)
HCT: 31.2 % — ABNORMAL LOW (ref 36.0–46.0)
HEMOGLOBIN: 10.4 g/dL — AB (ref 12.0–15.0)
LYMPHS PCT: 30 % (ref 12–46)
Lymphs Abs: 1.1 10*3/uL (ref 0.7–4.0)
MCH: 26.9 pg (ref 26.0–34.0)
MCHC: 33.3 g/dL (ref 30.0–36.0)
MCV: 80.8 fL (ref 78.0–100.0)
MONOS PCT: 9 % (ref 3–12)
MPV: 10.9 fL (ref 8.6–12.4)
Monocytes Absolute: 0.3 10*3/uL (ref 0.1–1.0)
NEUTROS ABS: 2.1 10*3/uL (ref 1.7–7.7)
NEUTROS PCT: 59 % (ref 43–77)
PLATELETS: 117 10*3/uL — AB (ref 150–400)
RBC: 3.86 MIL/uL — ABNORMAL LOW (ref 3.87–5.11)
RDW: 15.1 % (ref 11.5–15.5)
WBC: 3.5 10*3/uL — ABNORMAL LOW (ref 4.0–10.5)

## 2014-10-18 LAB — COMPLETE METABOLIC PANEL WITH GFR
ALT: 22 U/L (ref 6–29)
AST: 25 U/L (ref 10–35)
Albumin: 3.8 g/dL (ref 3.6–5.1)
Alkaline Phosphatase: 42 U/L (ref 33–130)
BILIRUBIN TOTAL: 0.6 mg/dL (ref 0.2–1.2)
BUN: 22 mg/dL (ref 7–25)
CALCIUM: 9.2 mg/dL (ref 8.6–10.4)
CO2: 22 mmol/L (ref 20–31)
Chloride: 97 mmol/L — ABNORMAL LOW (ref 98–110)
Creat: 0.87 mg/dL (ref 0.60–0.88)
GFR, EST AFRICAN AMERICAN: 70 mL/min (ref >=60)
GFR, EST NON AFRICAN AMERICAN: 61 mL/min (ref >=60)
Glucose, Bld: 217 mg/dL — ABNORMAL HIGH (ref 70–99)
Potassium: 4.5 mmol/L (ref 3.5–5.3)
Sodium: 132 mmol/L — ABNORMAL LOW (ref 135–146)
TOTAL PROTEIN: 6.5 g/dL (ref 6.1–8.1)

## 2014-10-18 LAB — HEMOGLOBIN A1C
Hgb A1c MFr Bld: 8.2 % — ABNORMAL HIGH (ref <5.7)
Mean Plasma Glucose: 189 mg/dL — ABNORMAL HIGH (ref <117)

## 2014-10-19 ENCOUNTER — Encounter: Payer: Self-pay | Admitting: Family Medicine

## 2014-10-19 ENCOUNTER — Ambulatory Visit (INDEPENDENT_AMBULATORY_CARE_PROVIDER_SITE_OTHER): Payer: Medicare HMO | Admitting: Family Medicine

## 2014-10-19 ENCOUNTER — Encounter (HOSPITAL_COMMUNITY): Payer: Medicare HMO

## 2014-10-19 VITALS — BP 160/70 | HR 86 | Temp 97.2°F | Resp 16 | Ht 63.0 in | Wt 134.0 lb

## 2014-10-19 DIAGNOSIS — Z951 Presence of aortocoronary bypass graft: Secondary | ICD-10-CM | POA: Diagnosis not present

## 2014-10-19 DIAGNOSIS — Z8679 Personal history of other diseases of the circulatory system: Secondary | ICD-10-CM

## 2014-10-19 DIAGNOSIS — Z794 Long term (current) use of insulin: Principal | ICD-10-CM

## 2014-10-19 DIAGNOSIS — I252 Old myocardial infarction: Secondary | ICD-10-CM | POA: Diagnosis not present

## 2014-10-19 DIAGNOSIS — E1165 Type 2 diabetes mellitus with hyperglycemia: Principal | ICD-10-CM

## 2014-10-19 DIAGNOSIS — E1065 Type 1 diabetes mellitus with hyperglycemia: Secondary | ICD-10-CM | POA: Diagnosis not present

## 2014-10-19 DIAGNOSIS — IMO0001 Reserved for inherently not codable concepts without codable children: Secondary | ICD-10-CM

## 2014-10-19 MED ORDER — AMLODIPINE BESYLATE 10 MG PO TABS: 10.0000 mg | ORAL_TABLET | Freq: Every day | ORAL | Status: DC

## 2014-10-19 MED ORDER — INSULIN SYRINGES (DISPOSABLE) U-100 1 ML MISC: Status: DC

## 2014-10-19 MED ORDER — OMEPRAZOLE 40 MG PO CPDR: 40.0000 mg | DELAYED_RELEASE_CAPSULE | Freq: Every day | ORAL | Status: DC

## 2014-10-19 MED ORDER — DOCUSATE SODIUM 100 MG PO CAPS: 100.0000 mg | ORAL_CAPSULE | Freq: Two times a day (BID) | ORAL | Status: DC | PRN

## 2014-10-19 NOTE — Progress Notes (Signed)
Subjective:    Patient ID: Misty Edwards, female    DOB: [birth year], 81 y.o.   MRN: [id number]  HPI 08/24/14 Patient is having very unusual symptoms. Symptoms began about one week ago. They start with severe pain in her frontal sinus area and in her for head. This radiates into both the ears and then into her right jaw.  The pain is intense. It improves if she goes and lies down. If she does not lie down, the pain then radiates from her jaw into her chest. She does report dyspnea on exertion and decreased stamina. She also has vague central chest discomfort that she thinks is indigestion which is associated with the headache and with the shortness of breath. She denies any photophobia or phonophobia. She denies any jaw location. She denies any pain with chewing. She denies any rhinorrhea or postnasal drip. She denies any cough or sore throat. She denies any vision changes.  At that time, my plan was: Symptoms are confusing and difficult to relate. Symptoms certainly seem to be due to stress in caring for her husband with dementia. It is possible (anxiety and tension headaches. Differential diagnosis for headaches including tension headaches, migraine, space-occupying lesion in the brain, temporal arteritis,, trigeminal neuralgia. However none of these headache disorders should cause dyspnea on exertion or chest discomfort. I believe the patient is mainly having tension headaches. I believe the chest discomfort is likely an anxiety attack. However given her age and her diabetes, she is certainly at risk for cardiovascular disease.   However the patient's EKG today is abnormal. She has ST segment depression in lead 1 and aVL. In aVL she is almost having T-wave inversions. She also has diffuse ST segment depression in lead V4 V5 and V6. Given her age, her hypertension, and her diabetes, the likelihood of cardiovascular disease is too high to be ignored. Furthermore she is having the symptoms at rest  unrelated to exertion. Therefore I recommended going to the emergency room for a 24-hour ACS rule out. Once we are sure that her heart is not the root cause of her chest pain we can certainly turn our focus to treating her headaches like a tension headaches which I believe that they are.  10/19/14 Patient went to the hospital was found to be in a non-ST elevation myocardial infarction. She subsequently underwent 3 vessel CABG. She was recently discharged from skilled nursing facility/rehabilitation center. She is here today for follow-up.  Patient's blood sugars at home have been ranging 200-400. She was decreased from 43 units of Novolin to 23 units and Novolin in the hospital. However at this time she was not eating very much. She was also on a low carbohydrate diet. Since going home, she is no lower consuming a low carbohydrate diet and she is also eating more regularly. She talked to my PA over the weekend who started her on sliding scale insulin. She's been giving herself approximately 8 units of regular insulin 4 times a day. Therefore she is on a total of 55 units of insulin. Her blood pressure is also elevated at 160/70 Past Medical History  Diagnosis Date  . Hypertension   . History of carcinoma in situ of breast 1988  . Acute biliary pancreatitis 07/2002    thia was in 07/2004:she still has pseudocyst in tail of pancreas measuring 54 x 33 mm   . Upper GI bleed September2004    secondary to gastritis  . Diabetes mellitus   . Adrenal adenoma  bilateral  . Detached retina   . Chronic pancreatitis   . Hyperlipidemia   . Osteopenia   . IBS (irritable bowel syndrome)   . Legally blind   . Anxiety   . Osteoarthritis   . Diverticulosis    Past Surgical History  Procedure Laterality Date  . Colonoscopy  08/15/05    few tiny diverticula at sigmoid colon/external hemorrhoids but no polyps  . Right mastectomy    . Ectopic pregnancy surgery  1950's  . Tacking up of her bladder    .  Laparoscopic cholecystectomy    . Ercp with sphincterotomy  07/2002  . Pancreatic pseudocyst drainage  June2004    drained percutaneously   . Esophagogastroduodenoscopy       Gastritis of body.  Otherwise normal  . Colonoscopy  01/08/2012    MWN:[id number] diverticulosis. Next colonoscopy in 12/2016  . Esophagogastroduodenoscopy  01/08/2012    RMR: Few scattered gastric erosions of uncertain significance-status post biopsy. Minimal chronic inflammation, no H.pylori  . Cardiac catheterization N/A 08/24/2014    Procedure: Left Heart Cath and Coronary Angiography;  Surgeon: Leonie Man, MD;  Location: [place] [facility];  Service: Cardiovascular;  Laterality: N/A;  . Coronary artery bypass graft N/A 08/24/2014    Procedure: CORONARY ARTERY BYPASS GRAFTING (CABG) x three, using left internal mammary artery and right leg greater saphenous vein harvested endoscopically;  Surgeon: Ivin Poot, MD;  Location: [place];  Service: Open Heart Surgery;  Laterality: N/A;  . Tee without cardioversion N/A 08/24/2014    Procedure: TRANSESOPHAGEAL ECHOCARDIOGRAM (TEE);  Surgeon: Ivin Poot, MD;  Location: [place];  Service: Open Heart Surgery;  Laterality: N/A;   Current Outpatient Prescriptions on File Prior to Visit  Medication Sig Dispense Refill  . acetaminophen (TYLENOL) 500 MG tablet Take 500-1,000 mg by mouth 2 (two) times daily as needed. Pain.    Marland Kitchen aspirin EC 81 MG EC tablet Take 1 tablet (81 mg total) by mouth daily.    . cetirizine (ZYRTEC) 10 MG tablet TAKE ONE TABLET BY MOUTH ONCE DAILY 90 tablet 3  . CREON 24000 UNITS CPEP 2 w/ meals, 1 w/ snacks (Patient taking differently: Take 2 capsules by mouth 3 (three) times daily with meals. ) 720 capsule 3  . cycloSPORINE (RESTASIS) 0.05 % ophthalmic emulsion Place 1 drop into both eyes 3 (three) times daily.     Marland Kitchen docusate sodium (COLACE) 100 MG capsule Take 100 mg by mouth 2 (two) times daily as needed for mild constipation.    . enalapril (VASOTEC) 20  MG tablet Take 1 tablet (20 mg total) by mouth daily. 90 tablet 3  . feeding supplement, ENSURE ENLIVE, (ENSURE ENLIVE) LIQD Take 237 mLs by mouth 2 (two) times daily between meals. 237 mL 12  . furosemide (LASIX) 20 MG tablet Take 20 mg by mouth daily.    . hydrALAZINE (APRESOLINE) 10 MG tablet Take 1 tablet (10 mg total) by mouth every 8 (eight) hours.    . insulin aspart (NOVOLOG) 100 UNIT/ML injection Inject 0-24 Units into the skin 4 (four) times daily -  with meals and at bedtime. 10 mL 11  . insulin NPH Human (NOVOLIN N RELION) 100 UNIT/ML injection Inject 0.2 mLs (20 Units total) into the skin daily before breakfast. (Patient taking differently: Inject 23 Units into the skin daily before breakfast. ) 160 mL 3  . LORazepam (ATIVAN) 0.5 MG tablet TAKE ONE TABLET BY MOUTH EVERY 8 HOURS AS NEEDED FOR ANXIETY OR  AGITATION 30 tablet 0  . metFORMIN (GLUCOPHAGE-XR) 500 MG 24 hr tablet Take 500 mg by mouth daily with breakfast.    . metoprolol (LOPRESSOR) 50 MG tablet Take 1 tablet (50 mg total) by mouth 2 (two) times daily.    . Multiple Vitamin (MULTIVITAMIN) capsule Take 1 capsule by mouth daily.    Marland Kitchen omeprazole (PRILOSEC) 40 MG capsule Take 40 mg by mouth daily.    Marland Kitchen oxyCODONE (OXY IR/ROXICODONE) 5 MG immediate release tablet Take one tablet by mouth every 6 hours as needed for severe pain 120 tablet 0  . potassium chloride (K-DUR,KLOR-CON) 10 MEQ tablet Take 10 mEq by mouth daily.    . promethazine (PHENERGAN) 12.5 MG tablet Take 0.5-1 tablets (6.25-12.5 mg total) by mouth every 6 (six) hours as needed for nausea or vomiting. 30 tablet 0  . rosuvastatin (CRESTOR) 40 MG tablet Take 1 tablet (40 mg total) by mouth daily at 6 PM.     No current facility-administered medications on file prior to visit.   Allergies  Allergen Reactions  . Calcium-Containing Compounds Nausea Only  . Raloxifene Itching    Face and eyes burning  . Vitamin D Analogs Nausea Only   History   Social History  .  Marital Status: Married    Spouse Name: N/A  . Number of Children: 5  . Years of Education: N/A   Occupational History  . homemaker    Social History Main Topics  . Smoking status: Never Smoker   . Smokeless tobacco: Not on file  . Alcohol Use: No  . Drug Use: No  . Sexual Activity: No   Other Topics Concern  . Not on file   Social History Narrative   Lives in [place].     Review of Systems  All other systems reviewed and are negative.      Objective:   Physical Exam  Constitutional: She is oriented to person, place, and time. She appears well-developed and well-nourished. No distress.  Cardiovascular: Normal rate, regular rhythm and normal heart sounds.   Pulmonary/Chest: Effort normal and breath sounds normal.  Neurological: She is alert and oriented to person, place, and time. She has normal reflexes. No cranial nerve deficit. She exhibits normal muscle tone. Coordination normal.  Skin: She is not diaphoretic.          Assessment & Plan:  Diabetes mellitus, insulin dependent (IDDM), uncontrolled  History of ASCVD  S/P CABG x 3  Hx of non-ST elevation myocardial infarction (NSTEMI)  I will increase the patient's Novolin to 30 units in the morning and 10 units in the afternoon. Recheck sugars on Monday. We will likely increase Novolin to 30 and 20 depending on her sugars.  I reviewed reviewed the patient's lab work and I recommended starting amlodipine 10 mg by mouth daily to help lower her blood pressure. Recheck in 2 weeks

## 2014-10-20 ENCOUNTER — Other Ambulatory Visit: Payer: Self-pay | Admitting: Internal Medicine

## 2014-10-23 ENCOUNTER — Encounter (HOSPITAL_COMMUNITY)
Admit: 2014-10-23 | Discharge: 2014-10-23 | Disposition: A | Discharge status: Home / Self Care | Payer: Medicare HMO | Attending: Cardiovascular Disease | Admitting: Cardiovascular Disease

## 2014-10-23 DIAGNOSIS — I252 Old myocardial infarction: Secondary | ICD-10-CM | POA: Diagnosis not present

## 2014-10-25 ENCOUNTER — Encounter (HOSPITAL_COMMUNITY)
Admit: 2014-10-25 | Discharge: 2014-10-25 | Disposition: A | Discharge status: Home / Self Care | Payer: Medicare HMO | Attending: Cardiovascular Disease | Admitting: Cardiovascular Disease

## 2014-10-25 DIAGNOSIS — I252 Old myocardial infarction: Secondary | ICD-10-CM | POA: Diagnosis not present

## 2014-10-25 NOTE — Progress Notes (Signed)
Cardiac Rehabilitation Program Outcomes Report   Orientation:  10/17/14 Graduate Date:  tbd Discharge Date:  tbd # of sessions completed: 3  Cardiologist: Rennis Harding MD:  Fredirick Lathe Time:  0815  A.  Exercise Program:  Tolerates exercise @ 3.34 METS for 15 minutes and Walk Test Results:  Pre: walk test 1.87 mets  B.  Mental Health:  Good mental attitude  C.  Education/Instruction/Skills  Accurately checks own pulse.  Rest:  66  Exercise:  80  Uses Perceived Exertion Scale and/or Dyspnea Scale  D.  Nutrition/Weight Control/Body Composition:  Adherence to prescribed nutrition program: fair    E.  Blood Lipids    Lab Results  Component Value Date   CHOL 80* 10/18/2014   HDL 54 10/18/2014   LDLCALC 17 10/18/2014   TRIG 47 10/18/2014   CHOLHDL 1.5 10/18/2014    F.  Lifestyle Changes:  Making positive lifestyle changes  G.  Symptoms noted with exercise:  Asymptomatic  Report Completed By:  Stevphen Rochester RN    Comments:  This is the patients first week progress note for [facility].

## 2014-10-27 ENCOUNTER — Encounter (HOSPITAL_COMMUNITY)
Admit: 2014-10-27 | Discharge: 2014-10-27 | Disposition: A | Discharge status: Home / Self Care | Payer: Medicare HMO | Attending: Cardiovascular Disease | Admitting: Cardiovascular Disease

## 2014-10-27 DIAGNOSIS — I252 Old myocardial infarction: Secondary | ICD-10-CM | POA: Diagnosis not present

## 2014-10-30 ENCOUNTER — Other Ambulatory Visit: Payer: Self-pay | Admitting: Family Medicine

## 2014-10-30 ENCOUNTER — Other Ambulatory Visit: Payer: Self-pay | Admitting: Internal Medicine

## 2014-10-30 ENCOUNTER — Encounter (HOSPITAL_COMMUNITY)
Admit: 2014-10-30 | Discharge: 2014-10-30 | Disposition: A | Discharge status: Home / Self Care | Payer: Medicare HMO | Attending: Cardiovascular Disease | Admitting: Cardiovascular Disease

## 2014-10-30 DIAGNOSIS — I252 Old myocardial infarction: Secondary | ICD-10-CM | POA: Diagnosis not present

## 2014-10-31 NOTE — Telephone Encounter (Signed)
ok 

## 2014-10-31 NOTE — Telephone Encounter (Signed)
?   OK to Refill  

## 2014-10-31 NOTE — Telephone Encounter (Signed)
Medication called to pharmacy. 

## 2014-11-01 ENCOUNTER — Encounter (HOSPITAL_COMMUNITY)
Admit: 2014-11-01 | Discharge: 2014-11-01 | Disposition: A | Discharge status: Home / Self Care | Payer: Medicare HMO | Attending: Cardiovascular Disease | Admitting: Cardiovascular Disease

## 2014-11-01 DIAGNOSIS — I252 Old myocardial infarction: Secondary | ICD-10-CM | POA: Diagnosis not present

## 2014-11-03 ENCOUNTER — Other Ambulatory Visit: Payer: Self-pay | Admitting: Family Medicine

## 2014-11-03 ENCOUNTER — Encounter (HOSPITAL_COMMUNITY)
Admit: 2014-11-03 | Discharge: 2014-11-03 | Disposition: A | Discharge status: Home / Self Care | Payer: Medicare HMO | Attending: Cardiovascular Disease | Admitting: Cardiovascular Disease

## 2014-11-03 ENCOUNTER — Telehealth: Payer: Self-pay | Admitting: Family Medicine

## 2014-11-03 DIAGNOSIS — I252 Old myocardial infarction: Secondary | ICD-10-CM | POA: Diagnosis not present

## 2014-11-03 MED ORDER — POTASSIUM CHLORIDE CRYS ER 10 MEQ PO TBCR: 10.0000 meq | EXTENDED_RELEASE_TABLET | Freq: Every day | ORAL | Status: DC

## 2014-11-03 MED ORDER — HYDRALAZINE HCL 10 MG PO TABS: 10.0000 mg | ORAL_TABLET | Freq: Three times a day (TID) | ORAL | Status: DC

## 2014-11-03 MED ORDER — METOPROLOL TARTRATE 50 MG PO TABS: 50.0000 mg | ORAL_TABLET | Freq: Two times a day (BID) | ORAL | Status: DC

## 2014-11-03 MED ORDER — FUROSEMIDE 20 MG PO TABS: 20.0000 mg | ORAL_TABLET | Freq: Every day | ORAL | Status: DC

## 2014-11-03 MED ORDER — ROSUVASTATIN CALCIUM 40 MG PO TABS: 40.0000 mg | ORAL_TABLET | Freq: Every day | ORAL | Status: DC

## 2014-11-03 NOTE — Telephone Encounter (Signed)
Patients daughter calling to speak with you regarding bp and sugar for [name]  [phone]

## 2014-11-06 ENCOUNTER — Encounter (HOSPITAL_COMMUNITY)
Admit: 2014-11-06 | Discharge: 2014-11-06 | Disposition: A | Discharge status: Home / Self Care | Payer: Medicare HMO | Attending: Cardiovascular Disease | Admitting: Cardiovascular Disease

## 2014-11-06 DIAGNOSIS — I252 Old myocardial infarction: Secondary | ICD-10-CM | POA: Diagnosis not present

## 2014-11-06 NOTE — Telephone Encounter (Signed)
LMTRC

## 2014-11-08 ENCOUNTER — Encounter (HOSPITAL_COMMUNITY)
Admit: 2014-11-08 | Discharge: 2014-11-08 | Disposition: A | Discharge status: Home / Self Care | Payer: Medicare HMO | Attending: Cardiovascular Disease | Admitting: Cardiovascular Disease

## 2014-11-08 DIAGNOSIS — I252 Old myocardial infarction: Secondary | ICD-10-CM | POA: Diagnosis not present

## 2014-11-10 ENCOUNTER — Encounter (HOSPITAL_COMMUNITY)
Admit: 2014-11-10 | Discharge: 2014-11-10 | Disposition: A | Discharge status: Home / Self Care | Payer: Medicare HMO | Attending: Cardiovascular Disease | Admitting: Cardiovascular Disease

## 2014-11-10 DIAGNOSIS — I252 Old myocardial infarction: Secondary | ICD-10-CM | POA: Diagnosis not present

## 2014-11-10 NOTE — Progress Notes (Signed)
Patient was given individual home exercise plan. Handout was reviewed and discussed. Patient verbalized an understanding. 

## 2014-11-13 ENCOUNTER — Encounter (HOSPITAL_COMMUNITY): Payer: Medicare HMO

## 2014-11-15 ENCOUNTER — Encounter (HOSPITAL_COMMUNITY): Payer: Medicare HMO

## 2014-11-16 ENCOUNTER — Other Ambulatory Visit: Payer: Self-pay | Admitting: Family Medicine

## 2014-11-16 NOTE — Telephone Encounter (Signed)
Medication called to pharmacy. 

## 2014-11-16 NOTE — Telephone Encounter (Signed)
ok 

## 2014-11-16 NOTE — Telephone Encounter (Signed)
?   OK to Refill  

## 2014-11-17 ENCOUNTER — Encounter (HOSPITAL_COMMUNITY)
Admission: RE | Admit: 2014-11-17 | Discharge: 2014-11-17 | Disposition: A | Discharge status: Home / Self Care | Payer: Medicare HMO | Source: Ambulatory Visit | Attending: Cardiovascular Disease | Admitting: Cardiovascular Disease

## 2014-11-17 DIAGNOSIS — I252 Old myocardial infarction: Secondary | ICD-10-CM | POA: Diagnosis not present

## 2014-11-17 DIAGNOSIS — Z951 Presence of aortocoronary bypass graft: Secondary | ICD-10-CM | POA: Insufficient documentation

## 2014-11-20 ENCOUNTER — Encounter (HOSPITAL_COMMUNITY): Payer: Medicare HMO

## 2014-11-22 ENCOUNTER — Ambulatory Visit (INDEPENDENT_AMBULATORY_CARE_PROVIDER_SITE_OTHER): Payer: Self-pay | Admitting: Cardiothoracic Surgery

## 2014-11-22 ENCOUNTER — Encounter: Payer: Self-pay | Admitting: Cardiothoracic Surgery

## 2014-11-22 ENCOUNTER — Encounter (HOSPITAL_COMMUNITY): Payer: Medicare HMO

## 2014-11-22 VITALS — BP 129/69 | HR 75 | Resp 20 | Ht 63.0 in | Wt 134.0 lb

## 2014-11-22 DIAGNOSIS — I5021 Acute systolic (congestive) heart failure: Secondary | ICD-10-CM

## 2014-11-22 DIAGNOSIS — I2510 Atherosclerotic heart disease of native coronary artery without angina pectoris: Secondary | ICD-10-CM

## 2014-11-22 DIAGNOSIS — Z951 Presence of aortocoronary bypass graft: Secondary | ICD-10-CM

## 2014-11-22 DIAGNOSIS — I2102 ST elevation (STEMI) myocardial infarction involving left anterior descending coronary artery: Secondary | ICD-10-CM

## 2014-11-22 NOTE — Progress Notes (Signed)
PCP is Odette Fraction, MD Referring Provider is Debara Pickett Nadean Corwin, MD  Chief Complaint  Patient presents with  . Routine Post Op    2 month f/u    HPI: Final postop visit for emergency CABG x 3  almost 3 months ago for acute MI with cardiogenic shock in this fragile 81 year old female. She has finally transition to home from skilled nursing facility. She is attending phase II outpatient cardiac rehabilitation at [facility]. She denies recurrent symptoms of angina. She does have somewhat swollen ankles on Lasix 20 mg a day-possibly related to her Norvasc 10 mg daily. We will increase her Lasix to 20 mg by mouth twice a day. Otherwise her overall strength and exercise tolerance has significantly improved. All surgical incisions healing well. She does not drive. She is ready to continue progressing with her normal household activities.   Past Medical History  Diagnosis Date  . Hypertension   . History of carcinoma in situ of breast 1988  . Acute biliary pancreatitis 07/2002    thia was in 07/2004:she still has pseudocyst in tail of pancreas measuring 54 x 33 mm   . Upper GI bleed September2004    secondary to gastritis  . Diabetes mellitus   . Adrenal adenoma     bilateral  . Detached retina   . Chronic pancreatitis   . Hyperlipidemia   . Osteopenia   . IBS (irritable bowel syndrome)   . Legally blind   . Anxiety   . Osteoarthritis   . Diverticulosis     Past Surgical History  Procedure Laterality Date  . Colonoscopy  08/15/05    few tiny diverticula at sigmoid colon/external hemorrhoids but no polyps  . Right mastectomy    . Ectopic pregnancy surgery  1950's  . Tacking up of her bladder    . Laparoscopic cholecystectomy    . Ercp with sphincterotomy  07/2002  . Pancreatic pseudocyst drainage  June2004    drained percutaneously   . Esophagogastroduodenoscopy       Gastritis of body.  Otherwise normal  . Colonoscopy  01/08/2012    PFX:[id number] diverticulosis. Next  colonoscopy in 12/2016  . Esophagogastroduodenoscopy  01/08/2012    RMR: Few scattered gastric erosions of uncertain significance-status post biopsy. Minimal chronic inflammation, no H.pylori  . Cardiac catheterization N/A 08/24/2014    Procedure: Left Heart Cath and Coronary Angiography;  Surgeon: Leonie Man, MD;  Location: [facility];  Service: Cardiovascular;  Laterality: N/A;  . Coronary artery bypass graft N/A 08/24/2014    Procedure: CORONARY ARTERY BYPASS GRAFTING (CABG) x three, using left internal mammary artery and right leg greater saphenous vein harvested endoscopically;  Surgeon: Ivin Poot, MD;  Location: [facility];  Service: Open Heart Surgery;  Laterality: N/A;  . Tee without cardioversion N/A 08/24/2014    Procedure: TRANSESOPHAGEAL ECHOCARDIOGRAM (TEE);  Surgeon: Ivin Poot, MD;  Location: [facility];  Service: Open Heart Surgery;  Laterality: N/A;    Family History  Problem Relation Age of Onset  . Colon cancer Father 27    passed away in his 58's  . Colon cancer Brother 36    surgery inhis 50's doing well now  . Heart attack Brother     passed away age 71  . Pancreatitis Brother   . Ovarian cancer Mother     passed  . Kidney disease Daughter   . Leukemia Son     Social History Social History  Substance Use Topics  .  Smoking status: Never Smoker   . Smokeless tobacco: None  . Alcohol Use: No    Current Outpatient Prescriptions  Medication Sig Dispense Refill  . acetaminophen (TYLENOL) 500 MG tablet Take 500-1,000 mg by mouth 2 (two) times daily as needed. Pain.    Marland Kitchen amLODipine (NORVASC) 10 MG tablet Take 1 tablet (10 mg total) by mouth daily. 90 tablet 3  . aspirin EC 81 MG EC tablet Take 1 tablet (81 mg total) by mouth daily.    . cetirizine (ZYRTEC) 10 MG tablet TAKE ONE TABLET BY MOUTH ONCE DAILY 90 tablet 3  . CREON 24000 UNITS CPEP 2 w/ meals, 1 w/ snacks (Patient taking differently: Take 2 capsules by mouth 3 (three) times daily with meals. )  720 capsule 3  . cycloSPORINE (RESTASIS) 0.05 % ophthalmic emulsion Place 1 drop into both eyes 3 (three) times daily.     Marland Kitchen docusate sodium (COLACE) 100 MG capsule Take 1 capsule (100 mg total) by mouth 2 (two) times daily as needed for mild constipation. 60 capsule 5  . enalapril (VASOTEC) 20 MG tablet Take 1 tablet (20 mg total) by mouth daily. 90 tablet 3  . feeding supplement, ENSURE ENLIVE, (ENSURE ENLIVE) LIQD Take 237 mLs by mouth 2 (two) times daily between meals. 237 mL 12  . furosemide (LASIX) 20 MG tablet Take 1 tablet (20 mg total) by mouth daily. 30 tablet 5  . hydrALAZINE (APRESOLINE) 10 MG tablet Take 1 tablet (10 mg total) by mouth every 8 (eight) hours. 90 tablet 1  . insulin NPH Human (NOVOLIN N RELION) 100 UNIT/ML injection Inject 0.2 mLs (20 Units total) into the skin daily before breakfast. (Patient taking differently: Inject 23 Units into the skin daily before breakfast. ) 160 mL 3  . Insulin Syringes, Disposable, U-100 1 ML MISC Pt uses 8 mm 31 g 300 each 3  . LORazepam (ATIVAN) 0.5 MG tablet TAKE ONE TABLET BY MOUTH EVERY 8 HOURS AS NEEDED FOR ANXIETY OR AGITATION 30 tablet 0  . metFORMIN (GLUCOPHAGE-XR) 500 MG 24 hr tablet Take 500 mg by mouth daily with breakfast.    . metoprolol (LOPRESSOR) 50 MG tablet Take 1 tablet (50 mg total) by mouth 2 (two) times daily. 60 tablet 5  . Multiple Vitamin (MULTIVITAMIN) capsule Take 1 capsule by mouth daily.    Marland Kitchen omeprazole (PRILOSEC) 40 MG capsule Take 1 capsule (40 mg total) by mouth daily. 30 capsule 11  . oxyCODONE (OXY IR/ROXICODONE) 5 MG immediate release tablet Take one tablet by mouth every 6 hours as needed for severe pain 120 tablet 0  . potassium chloride (K-DUR,KLOR-CON) 10 MEQ tablet Take 1 tablet (10 mEq total) by mouth daily. 30 tablet 5  . promethazine (PHENERGAN) 12.5 MG tablet Take 0.5-1 tablets (6.25-12.5 mg total) by mouth every 6 (six) hours as needed for nausea or vomiting. 30 tablet 0  . rosuvastatin (CRESTOR) 40  MG tablet Take 1 tablet (40 mg total) by mouth daily at 6 PM. 30 tablet 5  . zolpidem (AMBIEN) 10 MG tablet TAKE ONE TABLET BY MOUTH AT BEDTIME AS NEEDED FOR INSOMNIA 30 tablet 0   No current facility-administered medications for this visit.    Allergies  Allergen Reactions  . Calcium-Containing Compounds Nausea Only  . Raloxifene Itching    Face and eyes burning  . Vitamin D Analogs Nausea Only    Review of Systems   No fever Improved appetite Improved insomnia Depressed after the recent death of her husband  from Alzheimer's  BP 129/69 mmHg  Pulse 75  Resp 20  Ht 5\' 3"  (1.6 m)  Wt 134 lb (60.782 kg)  BMI 23.74 kg/m2  SpO2 97% Physical Exam alert and comfortable accompanied by her children Lungs clear Sternum stable well-healed Heart rhythm regular up murmur 1+ bilateral pedal edema, leg incision is well-healed  Diagnostic Tests: none  Impression: 81 year old frail woman after emergency CABG for acute MI with CHF and LV dysfunction and now is improving. She will transition to the care of her cardiologist and primary care physicians return to our office as needed. I have given her an additional Lasix prescription to take 20 mg twice a day as needed for ankle swelling.   Return as needed  Plan:   Len Childs, MD [facility] [phone]

## 2014-11-24 ENCOUNTER — Encounter (HOSPITAL_COMMUNITY)
Admit: 2014-11-24 | Discharge: 2014-11-24 | Disposition: A | Discharge status: Home / Self Care | Payer: Medicare HMO | Attending: Cardiovascular Disease | Admitting: Cardiovascular Disease

## 2014-11-24 DIAGNOSIS — I252 Old myocardial infarction: Secondary | ICD-10-CM | POA: Diagnosis not present

## 2014-11-26 NOTE — Progress Notes (Signed)
Cardiology Office Note   Date:  11/27/2014   ID:  Misty Edwards, DOB 1927-03-09, MRN [id number]  PCP:  Odette Fraction, MD  Cardiologist: To be established/  Jory Sims, NP   No chief complaint on file.     History of Present Illness: Misty Edwards is a 81 y.o. female who presents for ongoing assessment and management of CAD, severe left main disease with three-vessel CAD with an LVEF of 50%. CABG in 08/2014 LIMA to LAD, SVG to OM, and SVG to PDA.the patient had some postoperative thrombocytopenia. On last visit she was a resident of SNF for PT post CABG. She is undergoing cardiac rehab.   She saw Dr.Van Tright on 11/22/2014 and was found to have some edema in her ankles.She was increased on Lasixs dose to 20 mg BID as need for swelling, from daily dosing.   She comes today feeling well. She is enjoying cardiac rehab. She has some leg soreness and mild cramping since going up on lasix. She is not taking amlodipine regularly, only prn. She states the edema is improved with increased dose of lasix.   Past Medical History  Diagnosis Date  . Hypertension   . History of carcinoma in situ of breast 1988  . Acute biliary pancreatitis 07/2002    thia was in 07/2004:she still has pseudocyst in tail of pancreas measuring 54 x 33 mm   . Upper GI bleed September2004    secondary to gastritis  . Diabetes mellitus   . Adrenal adenoma     bilateral  . Detached retina   . Chronic pancreatitis   . Hyperlipidemia   . Osteopenia   . IBS (irritable bowel syndrome)   . Legally blind   . Anxiety   . Osteoarthritis   . Diverticulosis     Past Surgical History  Procedure Laterality Date  . Colonoscopy  08/15/05    few tiny diverticula at sigmoid colon/external hemorrhoids but no polyps  . Right mastectomy    . Ectopic pregnancy surgery  1950's  . Tacking up of her bladder    . Laparoscopic cholecystectomy    . Ercp with sphincterotomy  07/2002  . Pancreatic pseudocyst drainage  June2004     drained percutaneously   . Esophagogastroduodenoscopy       Gastritis of body.  Otherwise normal  . Colonoscopy  01/08/2012    CVE:[id number] diverticulosis. Next colonoscopy in 12/2016  . Esophagogastroduodenoscopy  01/08/2012    RMR: Few scattered gastric erosions of uncertain significance-status post biopsy. Minimal chronic inflammation, no H.pylori  . Cardiac catheterization N/A 08/24/2014    Procedure: Left Heart Cath and Coronary Angiography;  Surgeon: Leonie Man, MD;  Location: [facility];  Service: Cardiovascular;  Laterality: N/A;  . Coronary artery bypass graft N/A 08/24/2014    Procedure: CORONARY ARTERY BYPASS GRAFTING (CABG) x three, using left internal mammary artery and right leg greater saphenous vein harvested endoscopically;  Surgeon: Ivin Poot, MD;  Location: [facility];  Service: Open Heart Surgery;  Laterality: N/A;  . Tee without cardioversion N/A 08/24/2014    Procedure: TRANSESOPHAGEAL ECHOCARDIOGRAM (TEE);  Surgeon: Ivin Poot, MD;  Location: [facility];  Service: Open Heart Surgery;  Laterality: N/A;     Current Outpatient Prescriptions  Medication Sig Dispense Refill  . acetaminophen (TYLENOL) 500 MG tablet Take 500-1,000 mg by mouth 2 (two) times daily as needed. Pain.    Marland Kitchen amLODipine (NORVASC) 10 MG tablet Take 1 tablet (10 mg total) by  mouth daily. 90 tablet 3  . aspirin EC 81 MG EC tablet Take 1 tablet (81 mg total) by mouth daily.    . cetirizine (ZYRTEC) 10 MG tablet TAKE ONE TABLET BY MOUTH ONCE DAILY 90 tablet 3  . CREON 24000 UNITS CPEP 2 w/ meals, 1 w/ snacks (Patient taking differently: Take 2 capsules by mouth 3 (three) times daily with meals. ) 720 capsule 3  . cycloSPORINE (RESTASIS) 0.05 % ophthalmic emulsion Place 1 drop into both eyes 3 (three) times daily.     Marland Kitchen docusate sodium (COLACE) 100 MG capsule Take 1 capsule (100 mg total) by mouth 2 (two) times daily as needed for mild constipation. 60 capsule 5  . enalapril (VASOTEC) 20 MG  tablet Take 1 tablet (20 mg total) by mouth daily. 90 tablet 3  . feeding supplement, ENSURE ENLIVE, (ENSURE ENLIVE) LIQD Take 237 mLs by mouth 2 (two) times daily between meals. 237 mL 12  . furosemide (LASIX) 20 MG tablet Take 1 tablet (20 mg total) by mouth daily. (Patient taking differently: Take 20 mg by mouth daily. Taking 20 mg two times daily) 30 tablet 5  . hydrALAZINE (APRESOLINE) 10 MG tablet Take 1 tablet (10 mg total) by mouth every 8 (eight) hours. 90 tablet 1  . Insulin Syringes, Disposable, U-100 1 ML MISC Pt uses 8 mm 31 g 300 each 3  . LORazepam (ATIVAN) 0.5 MG tablet TAKE ONE TABLET BY MOUTH EVERY 8 HOURS AS NEEDED FOR ANXIETY OR AGITATION 30 tablet 0  . metFORMIN (GLUCOPHAGE-XR) 500 MG 24 hr tablet Take 500 mg by mouth daily with breakfast.    . metoprolol (LOPRESSOR) 50 MG tablet Take 1 tablet (50 mg total) by mouth 2 (two) times daily. 60 tablet 5  . Multiple Vitamin (MULTIVITAMIN) capsule Take 1 capsule by mouth daily.    Marland Kitchen omeprazole (PRILOSEC) 40 MG capsule Take 1 capsule (40 mg total) by mouth daily. 30 capsule 11  . oxyCODONE (OXY IR/ROXICODONE) 5 MG immediate release tablet Take one tablet by mouth every 6 hours as needed for severe pain 120 tablet 0  . potassium chloride (K-DUR,KLOR-CON) 10 MEQ tablet Take 1 tablet (10 mEq total) by mouth daily. 30 tablet 5  . promethazine (PHENERGAN) 12.5 MG tablet Take 0.5-1 tablets (6.25-12.5 mg total) by mouth every 6 (six) hours as needed for nausea or vomiting. 30 tablet 0  . rosuvastatin (CRESTOR) 40 MG tablet Take 1 tablet (40 mg total) by mouth daily at 6 PM. 30 tablet 5  . zolpidem (AMBIEN) 10 MG tablet TAKE ONE TABLET BY MOUTH AT BEDTIME AS NEEDED FOR INSOMNIA 30 tablet 0   No current facility-administered medications for this visit.    Allergies:   Calcium-containing compounds; Raloxifene; and Vitamin d analogs    Social History:  The patient  reports that she has never smoked. She does not have any smokeless tobacco  history on file. She reports that she does not drink alcohol or use illicit drugs.   Family History:  The patient's family history includes Colon cancer (age of onset: 38) in her brother; Colon cancer (age of onset: 67) in her father; Heart attack in her brother; Kidney disease in her daughter; Leukemia in her son; Ovarian cancer in her mother; Pancreatitis in her brother.    ROS: All other systems are reviewed and negative. Unless otherwise mentioned in H&P    PHYSICAL EXAM: VS:  BP 116/68 mmHg  Pulse 75  Ht 5\' 3"  (1.6 m)  Wt  134 lb 9.6 oz (61.054 kg)  BMI 23.85 kg/m2  SpO2 98% , BMI Body mass index is 23.85 kg/(m^2). GEN: Well nourished, well developed, in no acute distress HEENT: normal Neck: no JVD, carotid bruits, or masses Cardiac: RRR; no murmurs, rubs, or gallops,no edema  Respiratory:  clear to auscultation bilaterally, normal work of breathing GI: soft, nontender, nondistended, + BS MS: no deformity or atrophy Skin: warm and dry, no rash Neuro:  Strength and sensation are intact Psych: euthymic mood, full affect   Recent Labs: 08/25/2014: Magnesium 2.1 10/18/2014: ALT 22; BUN 22; Creat 0.87; Hemoglobin 10.4*; Platelets 117*; Potassium 4.5; Sodium 132*    Lipid Panel    Component Value Date/Time   CHOL 80* 10/18/2014 0830   TRIG 47 10/18/2014 0830   TRIG 90 10/15/2011 1538   HDL 54 10/18/2014 0830   CHOLHDL 1.5 10/18/2014 0830   VLDL 9 10/18/2014 0830   LDLCALC 17 10/18/2014 0830      Wt Readings from Last 3 Encounters:  11/27/14 134 lb 9.6 oz (61.054 kg)  11/22/14 134 lb (60.782 kg)  10/19/14 134 lb (60.782 kg)     ASSESSMENT AND PLAN:  1.  CAD: S/P CABG.She is asymptomatic with the exception of some mild muscle pain. I will check a BMET to evaluate kidney fx and potassium status on increased dose of lasix She is advised to go back to daily dose of lasix and use second dose prn edema for now until labs are reviewed. Continue BB, ASA, statin (see  below).  2. Hypercholesterolemia: She is on high dose statin, Crestor 40 mg daily. I will have her hold it for 5 days to assess if muscle pain is related to statin side effects. May have to decrease the dose to 20 mg daily if she has improvement in symptoms.   3. Edema: No further edema. She is not taking amlodipine regularly, and therefore cannot assume this is the cause. I am concerned about dehydration and hypokalemia with increased dose of lasix. Until labs are returned, she is to take lasix 20 mg daily and take additional lasix 20 mg prn for edema. She has normal EF per cardiac cath.   Current medicines are reviewed at length with the patient today.    Labs/ tests ordered today include: BMET    Disposition:   FU with 3 months  Signed, Jory Sims, NP  11/27/2014 3:43 PM    [facility] [address] [place], [place] [address] Phone: [phone]; Fax: [phone]

## 2014-11-27 ENCOUNTER — Encounter (HOSPITAL_COMMUNITY)
Admit: 2014-11-27 | Discharge: 2014-11-27 | Disposition: A | Discharge status: Home / Self Care | Payer: Medicare HMO | Attending: Cardiovascular Disease | Admitting: Cardiovascular Disease

## 2014-11-27 ENCOUNTER — Telehealth: Payer: Self-pay | Admitting: Family Medicine

## 2014-11-27 ENCOUNTER — Other Ambulatory Visit (HOSPITAL_COMMUNITY)
Admission: RE | Admit: 2014-11-27 | Discharge: 2014-11-27 | Disposition: A | Discharge status: Home / Self Care | Payer: Medicare HMO | Source: Ambulatory Visit | Attending: Adult Health | Admitting: Adult Health

## 2014-11-27 ENCOUNTER — Encounter: Payer: Self-pay | Admitting: Adult Health

## 2014-11-27 ENCOUNTER — Ambulatory Visit (INDEPENDENT_AMBULATORY_CARE_PROVIDER_SITE_OTHER): Payer: Medicare HMO | Admitting: Adult Health

## 2014-11-27 VITALS — BP 116/68 | HR 75 | Ht 63.0 in | Wt 134.6 lb

## 2014-11-27 DIAGNOSIS — J939 Pneumothorax, unspecified: Secondary | ICD-10-CM | POA: Insufficient documentation

## 2014-11-27 DIAGNOSIS — E7800 Pure hypercholesterolemia, unspecified: Secondary | ICD-10-CM

## 2014-11-27 DIAGNOSIS — I249 Acute ischemic heart disease, unspecified: Secondary | ICD-10-CM | POA: Diagnosis present

## 2014-11-27 DIAGNOSIS — R609 Edema, unspecified: Secondary | ICD-10-CM

## 2014-11-27 DIAGNOSIS — I252 Old myocardial infarction: Secondary | ICD-10-CM | POA: Diagnosis not present

## 2014-11-27 DIAGNOSIS — I2510 Atherosclerotic heart disease of native coronary artery without angina pectoris: Secondary | ICD-10-CM | POA: Insufficient documentation

## 2014-11-27 DIAGNOSIS — Z79899 Other long term (current) drug therapy: Secondary | ICD-10-CM

## 2014-11-27 LAB — BASIC METABOLIC PANEL
ANION GAP: 7 (ref 5–15)
BUN: 26 mg/dL — ABNORMAL HIGH (ref 6–20)
CHLORIDE: 96 mmol/L — AB (ref 101–111)
CO2: 29 mmol/L (ref 22–32)
Calcium: 8.9 mg/dL (ref 8.9–10.3)
Creatinine, Ser: 1.03 mg/dL — ABNORMAL HIGH (ref 0.44–1.00)
GFR, EST AFRICAN AMERICAN: 56 mL/min — AB (ref >60)
GFR, EST NON AFRICAN AMERICAN: 48 mL/min — AB (ref >60)
Glucose, Bld: 98 mg/dL (ref 65–99)
POTASSIUM: 3.8 mmol/L (ref 3.5–5.1)
SODIUM: 132 mmol/L — AB (ref 135–145)

## 2014-11-27 MED ORDER — FUROSEMIDE 20 MG PO TABS: 20.0000 mg | ORAL_TABLET | Freq: Every day | ORAL | Status: DC | PRN

## 2014-11-27 MED ORDER — ROSUVASTATIN CALCIUM 40 MG PO TABS: 40.0000 mg | ORAL_TABLET | Freq: Every day | ORAL | Status: DC

## 2014-11-27 NOTE — Telephone Encounter (Signed)
LMTRC

## 2014-11-27 NOTE — Progress Notes (Deleted)
Name: Misty Edwards    DOB: February 20, 1927  Age: 81 y.o.  MR#: [id number]       PCP:  Odette Fraction, MD      Insurance: Payor: [employer] Falling MEDICARE / Plan: [employer] MEDICARE HMO/PPO / Product Type: *No Product type* /   CC:   No chief complaint on file.   VS Filed Vitals:   11/27/14 1529  BP: 116/68  Pulse: 75  Height: $Remove'5\' 3"'sOaWDnP$  (1.6 m)  Weight: 134 lb 9.6 oz (61.054 kg)  SpO2: 98%    Weights Current Weight  11/27/14 134 lb 9.6 oz (61.054 kg)  11/22/14 134 lb (60.782 kg)  10/19/14 134 lb (60.782 kg)    Blood Pressure  BP Readings from Last 3 Encounters:  11/27/14 116/68  11/22/14 129/69  10/19/14 160/70     Admit date:  (Not on file) Last encounter with RMR:  09/14/2014   Allergy Calcium-containing compounds; Raloxifene; and Vitamin d analogs  Current Outpatient Prescriptions  Medication Sig Dispense Refill  . acetaminophen (TYLENOL) 500 MG tablet Take 500-1,000 mg by mouth 2 (two) times daily as needed. Pain.    Marland Kitchen amLODipine (NORVASC) 10 MG tablet Take 1 tablet (10 mg total) by mouth daily. 90 tablet 3  . aspirin EC 81 MG EC tablet Take 1 tablet (81 mg total) by mouth daily.    . cetirizine (ZYRTEC) 10 MG tablet TAKE ONE TABLET BY MOUTH ONCE DAILY 90 tablet 3  . CREON 24000 UNITS CPEP 2 w/ meals, 1 w/ snacks (Patient taking differently: Take 2 capsules by mouth 3 (three) times daily with meals. ) 720 capsule 3  . cycloSPORINE (RESTASIS) 0.05 % ophthalmic emulsion Place 1 drop into both eyes 3 (three) times daily.     Marland Kitchen docusate sodium (COLACE) 100 MG capsule Take 1 capsule (100 mg total) by mouth 2 (two) times daily as needed for mild constipation. 60 capsule 5  . enalapril (VASOTEC) 20 MG tablet Take 1 tablet (20 mg total) by mouth daily. 90 tablet 3  . feeding supplement, ENSURE ENLIVE, (ENSURE ENLIVE) LIQD Take 237 mLs by mouth 2 (two) times daily between meals. 237 mL 12  . furosemide (LASIX) 20 MG tablet Take 1 tablet (20 mg total) by mouth daily. (Patient taking differently:  Take 20 mg by mouth daily. Taking 20 mg two times daily) 30 tablet 5  . hydrALAZINE (APRESOLINE) 10 MG tablet Take 1 tablet (10 mg total) by mouth every 8 (eight) hours. 90 tablet 1  . Insulin Syringes, Disposable, U-100 1 ML MISC Pt uses 8 mm 31 g 300 each 3  . LORazepam (ATIVAN) 0.5 MG tablet TAKE ONE TABLET BY MOUTH EVERY 8 HOURS AS NEEDED FOR ANXIETY OR AGITATION 30 tablet 0  . metFORMIN (GLUCOPHAGE-XR) 500 MG 24 hr tablet Take 500 mg by mouth daily with breakfast.    . metoprolol (LOPRESSOR) 50 MG tablet Take 1 tablet (50 mg total) by mouth 2 (two) times daily. 60 tablet 5  . Multiple Vitamin (MULTIVITAMIN) capsule Take 1 capsule by mouth daily.    Marland Kitchen omeprazole (PRILOSEC) 40 MG capsule Take 1 capsule (40 mg total) by mouth daily. 30 capsule 11  . oxyCODONE (OXY IR/ROXICODONE) 5 MG immediate release tablet Take one tablet by mouth every 6 hours as needed for severe pain 120 tablet 0  . potassium chloride (K-DUR,KLOR-CON) 10 MEQ tablet Take 1 tablet (10 mEq total) by mouth daily. 30 tablet 5  . promethazine (PHENERGAN) 12.5 MG tablet Take 0.5-1 tablets (6.25-12.5 mg  total) by mouth every 6 (six) hours as needed for nausea or vomiting. 30 tablet 0  . rosuvastatin (CRESTOR) 40 MG tablet Take 1 tablet (40 mg total) by mouth daily at 6 PM. 30 tablet 5  . zolpidem (AMBIEN) 10 MG tablet TAKE ONE TABLET BY MOUTH AT BEDTIME AS NEEDED FOR INSOMNIA 30 tablet 0   No current facility-administered medications for this visit.    Discontinued Meds:    Medications Discontinued During This Encounter  Medication Reason  . insulin NPH Human (NOVOLIN N RELION) 100 UNIT/ML injection Error    Patient Active Problem List   Diagnosis Date Noted  . Anemia 09/26/2014  . Lesion of lip 09/25/2014  . S/P CABG x 3 08/25/2014  . NSTEMI (non-ST elevated myocardial infarction) 08/24/2014  . Acute coronary syndrome   . IBS (irritable bowel syndrome) 08/26/2012  . Thrombocytopenia 02/02/2012  . Epigastric pain  02/02/2012  . Gastritis 02/02/2012  . Abdominal pain 12/17/2011  . Normocytic anemia 12/17/2011  . History of colon polyps 12/17/2011  . Constipation 12/17/2011  . Family history of colon cancer 08/22/2011  . LLQ pain 08/21/2011  . CHRONIC PANCREATITIS 09/12/2008  . HELICOBACTER PYLORI INFECTION 01/26/2008  . ADENOCARCINOMA, BREAST 01/26/2008  . Diabetes mellitus type 2, insulin dependent 01/26/2008  . Essential hypertension 01/26/2008  . External hemorrhoids 01/26/2008  . GERD 01/26/2008  . CONSTIPATION 01/26/2008  . PSEUDOCYST, PANCREAS 01/26/2008  . GALLSTONE PANCREATITIS 01/26/2008  . GI BLEEDING 01/26/2008  . UTI 01/26/2008  . NAUSEA 01/26/2008  . VOMITING 01/26/2008  . DIARRHEA 01/26/2008    LABS    Component Value Date/Time   NA 132* 10/18/2014 0830   NA 132* 09/28/2014 0710   NA 130* 09/25/2014 0630   NA 136* 10/15/2011 1538   K 4.5 10/18/2014 0830   K 4.7 09/28/2014 0710   K 4.7 09/25/2014 0630   K 4.5 10/15/2011 1538   CL 97* 10/18/2014 0830   CL 96* 09/28/2014 0710   CL 94* 09/25/2014 0630   CO2 22 10/18/2014 0830   CO2 25 09/28/2014 0710   CO2 27 09/25/2014 0630   GLUCOSE 217* 10/18/2014 0830   GLUCOSE 334* 09/28/2014 0710   GLUCOSE 275* 09/25/2014 0630   BUN 22 10/18/2014 0830   BUN 26* 09/28/2014 0710   BUN 31* 09/25/2014 0630   BUN 15 10/15/2011 1538   CREATININE 0.87 10/18/2014 0830   CREATININE 0.90 09/28/2014 0710   CREATININE 0.89 09/25/2014 0630   CREATININE 0.84 09/19/2014 0710   CREATININE 0.77 01/13/2014 0856   CREATININE 0.95 07/19/2013 0912   CALCIUM 9.2 10/18/2014 0830   CALCIUM 9.1 09/28/2014 0710   CALCIUM 8.7* 09/25/2014 0630   CALCIUM 9.7 10/15/2011 1538   GFRNONAA 61 10/18/2014 0830   GFRNONAA 57* 09/28/2014 0710   GFRNONAA 57* 09/25/2014 0630   GFRNONAA >60 09/19/2014 0710   GFRNONAA 71 01/13/2014 0856   GFRNONAA 55* 07/19/2013 0912   GFRAA 70 10/18/2014 0830   GFRAA >60 09/28/2014 0710   GFRAA >60 09/25/2014 0630    GFRAA >60 09/19/2014 0710   GFRAA 81 01/13/2014 0856   GFRAA 64 07/19/2013 0912   CMP     Component Value Date/Time   NA 132* 10/18/2014 0830   NA 136* 10/15/2011 1538   K 4.5 10/18/2014 0830   K 4.5 10/15/2011 1538   CL 97* 10/18/2014 0830   CO2 22 10/18/2014 0830   GLUCOSE 217* 10/18/2014 0830   BUN 22 10/18/2014 0830   BUN 15 10/15/2011  1538   CREATININE 0.87 10/18/2014 0830   CREATININE 0.90 09/28/2014 0710   CALCIUM 9.2 10/18/2014 0830   CALCIUM 9.7 10/15/2011 1538   PROT 6.5 10/18/2014 0830   ALBUMIN 3.8 10/18/2014 0830   AST 25 10/18/2014 0830   AST 18 10/15/2011 1538   ALT 22 10/18/2014 0830   ALKPHOS 42 10/18/2014 0830   ALKPHOS 33 10/15/2011 1538   BILITOT 0.6 10/18/2014 0830   BILITOT 0.5 10/15/2011 1538   GFRNONAA 61 10/18/2014 0830   GFRNONAA 57* 09/28/2014 0710   GFRAA 70 10/18/2014 0830   GFRAA >60 09/28/2014 0710       Component Value Date/Time   WBC 3.5* 10/18/2014 0830   WBC 4.0 09/28/2014 0710   WBC 4.6 09/25/2014 0630   HGB 10.4* 10/18/2014 0830   HGB 10.2* 10/09/2014 0630   HGB 10.0* 09/28/2014 0710   HCT 31.2* 10/18/2014 0830   HCT 31.0* 10/09/2014 0630   HCT 30.3* 09/28/2014 0710   HCT 34 10/15/2011 1540   MCV 80.8 10/18/2014 0830   MCV 83.0 09/28/2014 0710   MCV 83.4 09/25/2014 0630   MCV 83.8 10/15/2011 1540    Lipid Panel     Component Value Date/Time   CHOL 80* 10/18/2014 0830   TRIG 47 10/18/2014 0830   TRIG 90 10/15/2011 1538   HDL 54 10/18/2014 0830   CHOLHDL 1.5 10/18/2014 0830   VLDL 9 10/18/2014 0830   LDLCALC 17 10/18/2014 0830    ABG    Component Value Date/Time   PHART 7.353 08/25/2014 1315   PCO2ART 40.4 08/25/2014 1315   PO2ART 116.0* 08/25/2014 1315   HCO3 22.4 08/25/2014 1315   TCO2 23 08/25/2014 1604   ACIDBASEDEF 3.0* 08/25/2014 1315   O2SAT 79.9 08/27/2014 0345     Lab Results  Component Value Date   TSH 0.753 08/21/2011   BNP (last 3 results) No results for input(s): BNP in the last 8760  hours.  ProBNP (last 3 results) No results for input(s): PROBNP in the last 8760 hours.  Cardiac Panel (last 3 results) No results for input(s): CKTOTAL, CKMB, TROPONINI, RELINDX in the last 72 hours.  Iron/TIBC/Ferritin/ %Sat    Component Value Date/Time   IRON 39 09/28/2014 0710   TIBC 379 09/28/2014 0710   FERRITIN 72 09/28/2014 0710   IRONPCTSAT 10* 09/28/2014 0710     EKG Orders placed or performed during the hospital encounter of 08/24/14  . EKG 12-Lead  . EKG 12-Lead  . EKG 12-Lead  . EKG 12-Lead  . EKG 12-Lead  . EKG 12-Lead  . EKG     Prior Assessment and Plan Problem List as of 11/27/2014      Cardiovascular and Mediastinum   Essential hypertension   External hemorrhoids   NSTEMI (non-ST elevated myocardial infarction)   Acute coronary syndrome     Digestive   GERD   Last Assessment & Plan 08/26/2012 Office Visit Written 08/27/2012  3:22 PM by Mahala Menghini, PA-C    Overall she seems to be doing well. Requires BID PPI for adequate control of GERD. Still using Reglan prn nausea and at sometimes up to 2-3 times per day. She reports much of her nausea is in early AM prior to meals. She may have element of gastroparesis but I doubt significant. She tolerates meals, denies early satiety. Suspect element of hypoglycemia as factor in early AM nausea. Requested she check blood glucose with these episodes to try and determine a correlation. If hypoglycemia documented, she  is to f/u with Dr. Dennard Schaumann.   She also has intermittent frequent loose stools which she self medicates with Imodium. She states she is taking her Creon as prescribed. She is up-to-date on her colonoscopy. Trial of Levsin. Call with further problems. Office visit in May 2015 by Dr. Gala Romney.      CONSTIPATION   Last Assessment & Plan 08/21/2011 Office Visit Written 08/22/2011 12:42 PM by Andria Meuse, NP    Call if you decide you want to proceed with colonoscopy TSH, Met 7 Use MiraLax 17 g daily as needed  for constipation Begin align daily      CHRONIC PANCREATITIS   Last Assessment & Plan 08/21/2011 Office Visit Written 08/22/2011 12:43 PM by Andria Meuse, NP    Doing well until acute onset left sided pain in past 24 hrs.  Doubt this is due to her pancreatitis.  Continue pancreatic enzymes.      GI BLEEDING   Constipation   Last Assessment & Plan 12/17/2011 Office Visit Written 12/17/2011  9:56 AM by Mahala Menghini, PA    Take Align one daily for four weeks. Take Benefiber daily.      Gastritis   IBS (irritable bowel syndrome)     Endocrine   Diabetes mellitus type 2, insulin dependent     Genitourinary   UTI     Other   HELICOBACTER PYLORI INFECTION   ADENOCARCINOMA, BREAST   PSEUDOCYST, PANCREAS   GALLSTONE PANCREATITIS   NAUSEA   Last Assessment & Plan 04/12/2012 Office Visit Written 04/12/2012 10:49 AM by Orvil Feil, NP    Unclear etiology at this point; CT overall benign from Nov 2013. Question an element of diabetic gastroparesis. She has had great improvement with low-dose Reglan QID. She has completed 3 refills of a 10 day course; therefore, she ultimately has not taken continuously since November. She is asking for another refill. I have discussed the side effects of this medication in detail with the patient and her daughter. I would feel more comfortable decreasing her to twice a day dosing, then once a day, then weaning off. Another agent that may be helpful is Zofran. Will hold off on GES, as it will not change the plan of care. She has an updated EGD on file and TCS. Continue PPI BID and Creon as prescribed. Return in 3 months to see Dr. Gala Romney.       VOMITING   DIARRHEA   LLQ pain   Last Assessment & Plan 08/21/2011 Office Visit Written 08/22/2011 12:15 PM by Andria Meuse, NP    Elberta Leatherwood Sievers is a pleasant 81 y.o. female in her usual state of health with acute onset LLQ pain.  She has hx chronic pancreatitis & pseudocysts but really has done very well for the past  several years until the last 24 hrs.  No significant associated symptoms with pain such as N/V, fever, diarrhea, etc.  ?chronic pancreatitis flare, evolving acute illness, vs. Pain secondary to chronic constipation/obstipation.    CBC, LFTs, lipase Offered pain meds, pt declined.  Instructed to ER if severe pain  If you need something for pain please call me Use MiraLax 17 g daily as needed for constipation Begin align daily      Family history of colon cancer   Last Assessment & Plan 08/21/2011 Office Visit Written 08/22/2011  4:20 PM by Andria Meuse, NP    Very spry 81 y/o female due for high-risk screening colonoscopy, however pt  declines at this time given her age.  I feel this is reasonable.  She will call if she changes her mind.      Abdominal pain   Normocytic anemia   Last Assessment & Plan 12/17/2011 Office Visit Written 12/17/2011  9:56 AM by Mahala Menghini, PA    Stable normocytic anemia, mild thrombocytopenia without h/o liver disease. Patient with personal history of breast cancer, reported h/o colon polyps, FH of two first degree relatives with colon cancer at relatively young age.   Chronic constipation. Chronic vague upper abdominal pain with intermittent Aleve use, indigestion. Offered patient EGD/TCS for further evaluation.  I have discussed the risks, alternatives, benefits with regards to but not limited to the risk of reaction to medication, bleeding, infection, perforation and the patient is agreeable to proceed. Written consent to be obtained.  She reports inadequate sedation with last colonoscopy in 2007. Will augment conscious sedation with phenergan 12.67m IV 30 minutes before procedure.   Based on EGD/TCS findings, we may need to consider abd u/s to rule out splenomegaly or liver disease.       History of colon polyps   Last Assessment & Plan 04/12/2012 Office Visit Written 04/12/2012 10:47 AM by AOrvil Feil NP    Surveillance due Oct 2018.        Thrombocytopenia   Epigastric pain   S/P CABG x 3   Lesion of lip   Anemia       Imaging: No results found.

## 2014-11-27 NOTE — Telephone Encounter (Signed)
Patients daughter calling regarding her pain medication and getting a refill, takes half in the morning and half at night and also has questions about her sugar readings and being thirsty  [phone]

## 2014-11-27 NOTE — Patient Instructions (Addendum)
Your physician has recommended you make the following change in your medication:  Please hold your crestor the rest of this week to see if your symptoms improve. Please call our office on Friday, December 01, 2014 with the response to holding crestor. Change your furosemide 20 mg to daily as needed. Continue all other medications the same. Your physician recommends that you have lab work today to check your BMET. Your physician recommends that you schedule a follow-up appointment in: 3 months. You will receive a reminder letter in the mail in about 1-2 months reminding you to call and schedule your appointment. If you don't receive this letter, please contact our office.

## 2014-11-28 NOTE — Telephone Encounter (Signed)
Spoke to Stanton and she would like to get a refill on pt's pain med - she is taking 1/2 tab bid. Also her BS has been in the 2-290's in the evening and she has had some lows in the AM 57-60's. She is eating a snack before bed. She is taking 30 u in am and 10 u in the afternoon.

## 2014-11-29 ENCOUNTER — Encounter (HOSPITAL_COMMUNITY)
Admit: 2014-11-29 | Discharge: 2014-11-29 | Disposition: A | Discharge status: Home / Self Care | Payer: Medicare HMO | Attending: Cardiovascular Disease | Admitting: Cardiovascular Disease

## 2014-11-29 DIAGNOSIS — I252 Old myocardial infarction: Secondary | ICD-10-CM | POA: Diagnosis not present

## 2014-11-30 MED ORDER — OXYCODONE HCL 5 MG PO TABS: ORAL_TABLET | ORAL | Status: DC

## 2014-11-30 NOTE — Telephone Encounter (Signed)
PT MADE AWARE OF RESULTS & OF MEDICATION CHANGE. SHE WILL REPEAT LABS IN 1 MONTH( I WILL MAIL LAB SLIP) SHE VOICED UNDERSTANDING. COPY TO PCP

## 2014-11-30 NOTE — Telephone Encounter (Signed)
Pt is currently taking Novolong 30u in am and 10 units around 3-4pm and is still having low BS during the night and in the AM even with a snack before bed.   Per [facility] stop the afternoon dose and continue the AM dose. She is also taking Oxycodone 5mg  1/2 tab po bid - Per [facility] ok to refill - rx printed and left up front  Debra aware of all the above recommendations.

## 2014-11-30 NOTE — Telephone Encounter (Signed)
Given her age, I wouldn't change her insulin.  [place] with refill on pain med.  Can we update her med list with the type of insulin and dose.

## 2014-12-01 ENCOUNTER — Encounter (HOSPITAL_COMMUNITY)
Admit: 2014-12-01 | Discharge: 2014-12-01 | Disposition: A | Discharge status: Home / Self Care | Payer: Medicare HMO | Attending: Cardiovascular Disease | Admitting: Cardiovascular Disease

## 2014-12-01 DIAGNOSIS — I252 Old myocardial infarction: Secondary | ICD-10-CM | POA: Diagnosis not present

## 2014-12-04 ENCOUNTER — Telehealth: Payer: Self-pay | Admitting: Adult Health

## 2014-12-04 ENCOUNTER — Encounter (HOSPITAL_COMMUNITY)
Admit: 2014-12-04 | Discharge: 2014-12-04 | Disposition: A | Discharge status: Home / Self Care | Payer: Medicare HMO | Attending: Cardiovascular Disease | Admitting: Cardiovascular Disease

## 2014-12-04 ENCOUNTER — Other Ambulatory Visit: Payer: Self-pay | Admitting: Family Medicine

## 2014-12-04 DIAGNOSIS — I252 Old myocardial infarction: Secondary | ICD-10-CM | POA: Diagnosis not present

## 2014-12-04 NOTE — Telephone Encounter (Signed)
Ok to refill??  Last office visit 10/19/2014.  Last refill 10/31/2014.

## 2014-12-04 NOTE — Telephone Encounter (Signed)
Medication called to pharmacy. 

## 2014-12-04 NOTE — Telephone Encounter (Signed)
ok 

## 2014-12-04 NOTE — Telephone Encounter (Signed)
pls call the pt's daughter concerning her Crestor and her Lasix

## 2014-12-05 ENCOUNTER — Telehealth: Payer: Self-pay | Admitting: Family Medicine

## 2014-12-05 NOTE — Telephone Encounter (Signed)
[name] [name] called back and wanted to let Curt Bears know that by stopping the Crestor for the week has helped her mother's leg pain tremendously. She also wanted to ask about the medication lovestatin as her mother was on that for years for her cholesterol and never had any problems with it, and wanted to see if maybe that would be an option. She also was concerned about her taking the lasix daily as she stays dizzy headed at times & she does not seem to be swelling. I did let her know that her lasix was to be taken daily AS NEEDED, and if she was not swelling then she did not HAVE TO take it. She states that her weight is stable. Her mother has a follow -up apt on 9/29. Daughter wants to know if maybe even cutting the Crestor dose in half would be an option if you feel that is a better medication for her cholesterol. Please advise.

## 2014-12-05 NOTE — Telephone Encounter (Signed)
Spoke to Misty Edwards and as per our ov notes pt should still be taking and if she has been taking since she came out of hosp to continue. She verbalizes understanding.

## 2014-12-05 NOTE — Telephone Encounter (Signed)
Patients daughter [name] calling to ask if her mom should still be taking enalapril, she told her that a doc had taken her off of this medication  [phone]

## 2014-12-06 ENCOUNTER — Encounter (HOSPITAL_COMMUNITY)
Admit: 2014-12-06 | Discharge: 2014-12-06 | Disposition: A | Discharge status: Home / Self Care | Payer: Medicare HMO | Attending: Cardiovascular Disease | Admitting: Cardiovascular Disease

## 2014-12-06 DIAGNOSIS — I252 Old myocardial infarction: Secondary | ICD-10-CM | POA: Diagnosis not present

## 2014-12-06 NOTE — Telephone Encounter (Signed)
It is ok for her to decrease the crestor to 1/2 tablet daily to begin with and will switch later if this dose causes recurrent symptoms. The lasix is PRN and should be used in that way. See her at next appt.

## 2014-12-07 MED ORDER — ROSUVASTATIN CALCIUM 40 MG PO TABS: ORAL_TABLET | ORAL | Status: DC

## 2014-12-07 NOTE — Addendum Note (Signed)
Addended by: Barbarann Ehlers A on: 12/07/2014 08:43 AM   Modules accepted: Orders

## 2014-12-07 NOTE — Telephone Encounter (Signed)
LM with details to daughter [name]

## 2014-12-08 ENCOUNTER — Encounter (HOSPITAL_COMMUNITY)
Admit: 2014-12-08 | Discharge: 2014-12-08 | Disposition: A | Discharge status: Home / Self Care | Payer: Medicare HMO | Attending: Cardiovascular Disease | Admitting: Cardiovascular Disease

## 2014-12-08 DIAGNOSIS — I252 Old myocardial infarction: Secondary | ICD-10-CM | POA: Diagnosis not present

## 2014-12-11 ENCOUNTER — Encounter (HOSPITAL_COMMUNITY)
Admit: 2014-12-11 | Discharge: 2014-12-11 | Disposition: A | Discharge status: Home / Self Care | Payer: Medicare HMO | Attending: Cardiovascular Disease | Admitting: Cardiovascular Disease

## 2014-12-11 DIAGNOSIS — I252 Old myocardial infarction: Secondary | ICD-10-CM | POA: Diagnosis not present

## 2014-12-11 NOTE — Progress Notes (Signed)
Cardiac Rehabilitation Program Outcomes Report   Orientation:  10/17/14 Graduate Date:  tbd Discharge Date:  tbd # of sessions completed: 18  Cardiologist: Rennis Harding MD:  Fredirick Lathe Time:  0815  A.  Exercise Program:  Tolerates exercise @ 3.35 METS for 15 minutes  B.  Mental Health:  Good mental attitude  C.  Education/Instruction/Skills  Accurately checks own pulse.  Rest:  68  Exercise:  82  Uses Perceived Exertion Scale and/or Dyspnea Scale  D.  Nutrition/Weight Control/Body Composition:  Adherence to prescribed nutrition program: good    E.  Blood Lipids    Lab Results  Component Value Date   CHOL 80* 10/18/2014   HDL 54 10/18/2014   LDLCALC 17 10/18/2014   TRIG 47 10/18/2014   CHOLHDL 1.5 10/18/2014    F.  Lifestyle Changes:  Making positive lifestyle changes  G.  Symptoms noted with exercise:  Asymptomatic  Report Completed By:  Stevphen Rochester RN   Comments:  This is the patients half way progress note for [facility].

## 2014-12-13 ENCOUNTER — Encounter (HOSPITAL_COMMUNITY)
Admit: 2014-12-13 | Discharge: 2014-12-13 | Disposition: A | Discharge status: Home / Self Care | Payer: Medicare HMO | Attending: Cardiovascular Disease | Admitting: Cardiovascular Disease

## 2014-12-13 DIAGNOSIS — I252 Old myocardial infarction: Secondary | ICD-10-CM | POA: Diagnosis not present

## 2014-12-14 ENCOUNTER — Ambulatory Visit (INDEPENDENT_AMBULATORY_CARE_PROVIDER_SITE_OTHER): Payer: Medicare HMO | Admitting: Adult Health

## 2014-12-14 ENCOUNTER — Encounter: Payer: Self-pay | Admitting: Adult Health

## 2014-12-14 VITALS — BP 138/68 | HR 75 | Ht 63.0 in | Wt 136.8 lb

## 2014-12-14 DIAGNOSIS — I2510 Atherosclerotic heart disease of native coronary artery without angina pectoris: Secondary | ICD-10-CM

## 2014-12-14 DIAGNOSIS — E7800 Pure hypercholesterolemia, unspecified: Secondary | ICD-10-CM

## 2014-12-14 DIAGNOSIS — F4321 Adjustment disorder with depressed mood: Secondary | ICD-10-CM | POA: Diagnosis not present

## 2014-12-14 MED ORDER — ROSUVASTATIN CALCIUM 20 MG PO TABS: 20.0000 mg | ORAL_TABLET | Freq: Every day | ORAL | Status: DC

## 2014-12-14 MED ORDER — AMLODIPINE BESYLATE 10 MG PO TABS
10.0000 mg | ORAL_TABLET | Freq: Every day | ORAL | Status: DC
Start: 2014-12-14 — End: 2015-03-07

## 2014-12-14 MED ORDER — ENALAPRIL MALEATE 20 MG PO TABS: 20.0000 mg | ORAL_TABLET | Freq: Every day | ORAL | Status: DC

## 2014-12-14 NOTE — Progress Notes (Signed)
Name: RIKI BERNINGER    DOB: 10-06-1926  Age: 81 y.o.  MR#: [id number]       PCP:  Odette Fraction, MD      Insurance: Payor: [employer] MEDICARE / Plan: [employer] MEDICARE HMO/PPO / Product Type: *No Product type* /   CC:    Chief Complaint  Patient presents with  . Coronary Artery Disease    VS Filed Vitals:   12/14/14 1309  BP: 138/68  Pulse: 75  Height: _0  (1.6 m)  Weight: 136 lb 12.8 oz (62.052 kg)  SpO2: 96%    Weights Current Weight  12/14/14 136 lb 12.8 oz (62.052 kg)  11/27/14 134 lb 9.6 oz (61.054 kg)  11/22/14 134 lb (60.782 kg)    Blood Pressure  BP Readings from Last 3 Encounters:  12/14/14 138/68  11/27/14 116/68  11/22/14 129/69     Admit date:  (Not on file) Last encounter with RMR:  12/04/2014   Allergy Calcium-containing compounds; Raloxifene; and Vitamin d analogs  Current Outpatient Prescriptions  Medication Sig Dispense Refill  . acetaminophen (TYLENOL) 500 MG tablet Take 500-1,000 mg by mouth 2 (two) times daily as needed. Pain.    Marland Kitchen amLODipine (NORVASC) 10 MG tablet Take 1 tablet (10 mg total) by mouth daily. 90 tablet 3  . aspirin EC 81 MG EC tablet Take 1 tablet (81 mg total) by mouth daily.    . cetirizine (ZYRTEC) 10 MG tablet TAKE ONE TABLET BY MOUTH ONCE DAILY 90 tablet 3  . CREON 24000 UNITS CPEP 2 w/ meals, 1 w/ snacks (Patient taking differently: Take 2 capsules by mouth 3 (three) times daily with meals. ) 720 capsule 3  . cycloSPORINE (RESTASIS) 0.05 % ophthalmic emulsion Place 1 drop into both eyes 3 (three) times daily.     Marland Kitchen docusate sodium (COLACE) 100 MG capsule Take 1 capsule (100 mg total) by mouth 2 (two) times daily as needed for mild constipation. 60 capsule 5  . enalapril (VASOTEC) 20 MG tablet Take 1 tablet (20 mg total) by mouth daily. 90 tablet 3  . feeding supplement, ENSURE ENLIVE, (ENSURE ENLIVE) LIQD Take 237 mLs by mouth 2 (two) times daily between meals. 237 mL 12  . furosemide (LASIX) 20 MG tablet Take 1 tablet (20 mg  total) by mouth daily as needed. 30 tablet 5  . hydrALAZINE (APRESOLINE) 10 MG tablet Take 1 tablet (10 mg total) by mouth every 8 (eight) hours. 90 tablet 1  . Insulin Syringes, Disposable, U-100 1 ML MISC Pt uses 8 mm 31 g 300 each 3  . LORazepam (ATIVAN) 0.5 MG tablet TAKE ONE TABLET BY MOUTH EVERY 8 HOURS AS NEEDED ANXIETY  OR  AGITATION 30 tablet 0  . metFORMIN (GLUCOPHAGE-XR) 500 MG 24 hr tablet Take 500 mg by mouth daily with breakfast.    . metoprolol (LOPRESSOR) 50 MG tablet Take 1 tablet (50 mg total) by mouth 2 (two) times daily. 60 tablet 5  . Multiple Vitamin (MULTIVITAMIN) capsule Take 1 capsule by mouth daily.    Marland Kitchen omeprazole (PRILOSEC) 40 MG capsule Take 1 capsule (40 mg total) by mouth daily. 30 capsule 11  . oxyCODONE (OXY IR/ROXICODONE) 5 MG immediate release tablet Take one half tablet by mouth BID prn severe pain 30 tablet 0  . potassium chloride (K-DUR,KLOR-CON) 10 MEQ tablet Take 1 tablet (10 mEq total) by mouth daily. 30 tablet 5  . promethazine (PHENERGAN) 12.5 MG tablet Take 0.5-1 tablets (6.25-12.5 mg total) by mouth every 6 (  six) hours as needed for nausea or vomiting. 30 tablet 0  . rosuvastatin (CRESTOR) 40 MG tablet 12/07/14 Take  1/2 tablet daily 30 tablet 5  . zolpidem (AMBIEN) 10 MG tablet TAKE ONE TABLET BY MOUTH AT BEDTIME AS NEEDED FOR INSOMNIA 30 tablet 0   No current facility-administered medications for this visit.    Discontinued Meds:   There are no discontinued medications.  Patient Active Problem List   Diagnosis Date Noted  . Anemia 09/26/2014  . Lesion of lip 09/25/2014  . S/P CABG x 3 08/25/2014  . NSTEMI (non-ST elevated myocardial infarction) 08/24/2014  . Acute coronary syndrome   . IBS (irritable bowel syndrome) 08/26/2012  . Thrombocytopenia 02/02/2012  . Epigastric pain 02/02/2012  . Gastritis 02/02/2012  . Abdominal pain 12/17/2011  . Normocytic anemia 12/17/2011  . History of colon polyps 12/17/2011  . Constipation 12/17/2011  .  Family history of colon cancer 08/22/2011  . LLQ pain 08/21/2011  . CHRONIC PANCREATITIS 09/12/2008  . HELICOBACTER PYLORI INFECTION 01/26/2008  . ADENOCARCINOMA, BREAST 01/26/2008  . Diabetes mellitus type 2, insulin dependent 01/26/2008  . Essential hypertension 01/26/2008  . External hemorrhoids 01/26/2008  . GERD 01/26/2008  . CONSTIPATION 01/26/2008  . PSEUDOCYST, PANCREAS 01/26/2008  . GALLSTONE PANCREATITIS 01/26/2008  . GI BLEEDING 01/26/2008  . UTI 01/26/2008  . NAUSEA 01/26/2008  . VOMITING 01/26/2008  . DIARRHEA 01/26/2008    LABS    Component Value Date/Time   NA 132* 11/27/2014 1624   NA 132* 10/18/2014 0830   NA 132* 09/28/2014 0710   NA 136* 10/15/2011 1538   K 3.8 11/27/2014 1624   K 4.5 10/18/2014 0830   K 4.7 09/28/2014 0710   K 4.5 10/15/2011 1538   CL 96* 11/27/2014 1624   CL 97* 10/18/2014 0830   CL 96* 09/28/2014 0710   CO2 29 11/27/2014 1624   CO2 22 10/18/2014 0830   CO2 25 09/28/2014 0710   GLUCOSE 98 11/27/2014 1624   GLUCOSE 217* 10/18/2014 0830   GLUCOSE 334* 09/28/2014 0710   BUN 26* 11/27/2014 1624   BUN 22 10/18/2014 0830   BUN 26* 09/28/2014 0710   BUN 15 10/15/2011 1538   CREATININE 1.03* 11/27/2014 1624   CREATININE 0.87 10/18/2014 0830   CREATININE 0.90 09/28/2014 0710   CREATININE 0.89 09/25/2014 0630   CREATININE 0.77 01/13/2014 0856   CREATININE 0.95 07/19/2013 0912   CALCIUM 8.9 11/27/2014 1624   CALCIUM 9.2 10/18/2014 0830   CALCIUM 9.1 09/28/2014 0710   CALCIUM 9.7 10/15/2011 1538   GFRNONAA 48* 11/27/2014 1624   GFRNONAA 61 10/18/2014 0830   GFRNONAA 57* 09/28/2014 0710   GFRNONAA 57* 09/25/2014 0630   GFRNONAA 71 01/13/2014 0856   GFRNONAA 55* 07/19/2013 0912   GFRAA 56* 11/27/2014 1624   GFRAA 70 10/18/2014 0830   GFRAA >60 09/28/2014 0710   GFRAA >60 09/25/2014 0630   GFRAA 81 01/13/2014 0856   GFRAA 64 07/19/2013 0912   CMP     Component Value Date/Time   NA 132* 11/27/2014 1624   NA 136* 10/15/2011  1538   K 3.8 11/27/2014 1624   K 4.5 10/15/2011 1538   CL 96* 11/27/2014 1624   CO2 29 11/27/2014 1624   GLUCOSE 98 11/27/2014 1624   BUN 26* 11/27/2014 1624   BUN 15 10/15/2011 1538   CREATININE 1.03* 11/27/2014 1624   CREATININE 0.87 10/18/2014 0830   CALCIUM 8.9 11/27/2014 1624   CALCIUM 9.7 10/15/2011 1538   PROT 6.5  10/18/2014 0830   ALBUMIN 3.8 10/18/2014 0830   AST 25 10/18/2014 0830   AST 18 10/15/2011 1538   ALT 22 10/18/2014 0830   ALKPHOS 42 10/18/2014 0830   ALKPHOS 33 10/15/2011 1538   BILITOT 0.6 10/18/2014 0830   BILITOT 0.5 10/15/2011 1538   GFRNONAA 48* 11/27/2014 1624   GFRNONAA 61 10/18/2014 0830   GFRAA 56* 11/27/2014 1624   GFRAA 70 10/18/2014 0830       Component Value Date/Time   WBC 3.5* 10/18/2014 0830   WBC 4.0 09/28/2014 0710   WBC 4.6 09/25/2014 0630   HGB 10.4* 10/18/2014 0830   HGB 10.2* 10/09/2014 0630   HGB 10.0* 09/28/2014 0710   HCT 31.2* 10/18/2014 0830   HCT 31.0* 10/09/2014 0630   HCT 30.3* 09/28/2014 0710   HCT 34 10/15/2011 1540   MCV 80.8 10/18/2014 0830   MCV 83.0 09/28/2014 0710   MCV 83.4 09/25/2014 0630   MCV 83.8 10/15/2011 1540    Lipid Panel     Component Value Date/Time   CHOL 80* 10/18/2014 0830   TRIG 47 10/18/2014 0830   TRIG 90 10/15/2011 1538   HDL 54 10/18/2014 0830   CHOLHDL 1.5 10/18/2014 0830   VLDL 9 10/18/2014 0830   LDLCALC 17 10/18/2014 0830    ABG    Component Value Date/Time   PHART 7.353 08/25/2014 1315   PCO2ART 40.4 08/25/2014 1315   PO2ART 116.0* 08/25/2014 1315   HCO3 22.4 08/25/2014 1315   TCO2 23 08/25/2014 1604   ACIDBASEDEF 3.0* 08/25/2014 1315   O2SAT 79.9 08/27/2014 0345     Lab Results  Component Value Date   TSH 0.753 08/21/2011   BNP (last 3 results) No results for input(s): BNP in the last 8760 hours.  ProBNP (last 3 results) No results for input(s): PROBNP in the last 8760 hours.  Cardiac Panel (last 3 results) No results for input(s): CKTOTAL, CKMB, TROPONINI,  RELINDX in the last 72 hours.  Iron/TIBC/Ferritin/ %Sat    Component Value Date/Time   IRON 39 09/28/2014 0710   TIBC 379 09/28/2014 0710   FERRITIN 72 09/28/2014 0710   IRONPCTSAT 10* 09/28/2014 0710     EKG Orders placed or performed during the hospital encounter of 08/24/14  . EKG 12-Lead  . EKG 12-Lead  . EKG 12-Lead  . EKG 12-Lead  . EKG 12-Lead  . EKG 12-Lead  . EKG     Prior Assessment and Plan Problem List as of 12/14/2014      Cardiovascular and Mediastinum   Essential hypertension   External hemorrhoids   NSTEMI (non-ST elevated myocardial infarction)   Acute coronary syndrome     Digestive   GERD   Last Assessment & Plan 08/26/2012 Office Visit Written 08/27/2012  3:22 PM by Mahala Menghini, PA-C    Overall she seems to be doing well. Requires BID PPI for adequate control of GERD. Still using Reglan prn nausea and at sometimes up to 2-3 times per day. She reports much of her nausea is in early AM prior to meals. She may have element of gastroparesis but I doubt significant. She tolerates meals, denies early satiety. Suspect element of hypoglycemia as factor in early AM nausea. Requested she check blood glucose with these episodes to try and determine a correlation. If hypoglycemia documented, she is to f/u with Dr. Dennard Schaumann.   She also has intermittent frequent loose stools which she self medicates with Imodium. She states she is taking her Creon as  prescribed. She is up-to-date on her colonoscopy. Trial of Levsin. Call with further problems. Office visit in May 2015 by Dr. Gala Romney.      CONSTIPATION   Last Assessment & Plan 08/21/2011 Office Visit Written 08/22/2011 12:42 PM by Andria Meuse, NP    Call if you decide you want to proceed with colonoscopy TSH, Met 7 Use MiraLax 17 g daily as needed for constipation Begin align daily      CHRONIC PANCREATITIS   Last Assessment & Plan 08/21/2011 Office Visit Written 08/22/2011 12:43 PM by Andria Meuse, NP    Doing well  until acute onset left sided pain in past 24 hrs.  Doubt this is due to her pancreatitis.  Continue pancreatic enzymes.      GI BLEEDING   Constipation   Last Assessment & Plan 12/17/2011 Office Visit Written 12/17/2011  9:56 AM by Mahala Menghini, PA    Take Align one daily for four weeks. Take Benefiber daily.      Gastritis   IBS (irritable bowel syndrome)     Endocrine   Diabetes mellitus type 2, insulin dependent     Genitourinary   UTI     Other   HELICOBACTER PYLORI INFECTION   ADENOCARCINOMA, BREAST   PSEUDOCYST, PANCREAS   GALLSTONE PANCREATITIS   NAUSEA   Last Assessment & Plan 04/12/2012 Office Visit Written 04/12/2012 10:49 AM by Orvil Feil, NP    Unclear etiology at this point; CT overall benign from Nov 2013. Question an element of diabetic gastroparesis. She has had great improvement with low-dose Reglan QID. She has completed 3 refills of a 10 day course; therefore, she ultimately has not taken continuously since November. She is asking for another refill. I have discussed the side effects of this medication in detail with the patient and her daughter. I would feel more comfortable decreasing her to twice a day dosing, then once a day, then weaning off. Another agent that may be helpful is Zofran. Will hold off on GES, as it will not change the plan of care. She has an updated EGD on file and TCS. Continue PPI BID and Creon as prescribed. Return in 3 months to see Dr. Gala Romney.       VOMITING   DIARRHEA   LLQ pain   Last Assessment & Plan 08/21/2011 Office Visit Written 08/22/2011 12:15 PM by Andria Meuse, NP    Elberta Leatherwood Buccellato is a pleasant 81 y.o. female in her usual state of health with acute onset LLQ pain.  She has hx chronic pancreatitis & pseudocysts but really has done very well for the past several years until the last 24 hrs.  No significant associated symptoms with pain such as N/V, fever, diarrhea, etc.  ?chronic pancreatitis flare, evolving acute illness, vs. Pain  secondary to chronic constipation/obstipation.    CBC, LFTs, lipase Offered pain meds, pt declined.  Instructed to ER if severe pain  If you need something for pain please call me Use MiraLax 17 g daily as needed for constipation Begin align daily      Family history of colon cancer   Last Assessment & Plan 08/21/2011 Office Visit Written 08/22/2011  4:20 PM by Andria Meuse, NP    Very spry 81 y/o female due for high-risk screening colonoscopy, however pt declines at this time given her age.  I feel this is reasonable.  She will call if she changes her mind.      Abdominal pain  Normocytic anemia   Last Assessment & Plan 12/17/2011 Office Visit Written 12/17/2011  9:56 AM by Mahala Menghini, PA    Stable normocytic anemia, mild thrombocytopenia without h/o liver disease. Patient with personal history of breast cancer, reported h/o colon polyps, FH of two first degree relatives with colon cancer at relatively young age.   Chronic constipation. Chronic vague upper abdominal pain with intermittent Aleve use, indigestion. Offered patient EGD/TCS for further evaluation.  I have discussed the risks, alternatives, benefits with regards to but not limited to the risk of reaction to medication, bleeding, infection, perforation and the patient is agreeable to proceed. Written consent to be obtained.  She reports inadequate sedation with last colonoscopy in 2007. Will augment conscious sedation with phenergan 12.31m IV 30 minutes before procedure.   Based on EGD/TCS findings, we may need to consider abd u/s to rule out splenomegaly or liver disease.       History of colon polyps   Last Assessment & Plan 04/12/2012 Office Visit Written 04/12/2012 10:47 AM by AOrvil Feil NP    Surveillance due Oct 2018.       Thrombocytopenia   Epigastric pain   S/P CABG x 3   Lesion of lip   Anemia       Imaging: No results found.

## 2014-12-14 NOTE — Patient Instructions (Signed)
Your physician wants you to follow-up in: [place] will receive a reminder letter in the mail two months in advance. If you don't receive a letter, please call our office to schedule the follow-up appointment.  Your physician recommends that you continue on your current medications as directed. Please refer to the Current Medication list given to you today.  I SENT IN 90 DAYS SUPPLY OF CARDIAC MEDICATIONS WITH 3 REFILLS  Thanks for choosing [facility]!!!

## 2014-12-14 NOTE — Progress Notes (Signed)
Cardiology Office Note   Date:  12/14/2014   ID:  Misty Edwards, DOB 1926-05-24, MRN [id number]  PCP:  Odette Fraction, MD  Cardiologist:  To be est/ Jory Sims, NP   Chief Complaint  Patient presents with  . Coronary Artery Disease      History of Present Illness: Misty Edwards is a 81 y.o. female who presents for for ongoing assessment and management of CAD, severe left main disease with three-vessel CAD with an LVEF of 50%. CABG in 08/2014 LIMA to LAD, SVG to OM, and SVG to PDA.the patient had some postoperative thrombocytopenia. She was reduced on lasix due to evidence of dehydration. Stopped Crestor for one week due to myalgia's and restarted at 1/2 dose. She is here for follow up evaluation. She is undergoing cardiac rehab.   She day without any further complaints with the exception of some mild fatigue, and dizziness on occasion.  She states he usually happens when her blood sugar goes a [employer].  She is working with her primary care physician on adjustments in her insulin dosage.  She is doing well in cardiac rehabilitation and continues to improve.  She continues to grieve the loss of her husband who died one month ago.  She has family members to stay with her.  Most recent labs: Na 132, potassium 3.8, Creatinine  1.03.  Past Medical History  Diagnosis Date  . Hypertension   . History of carcinoma in situ of breast 1988  . Acute biliary pancreatitis 07/2002    thia was in 07/2004:she still has pseudocyst in tail of pancreas measuring 54 x 33 mm   . Upper GI bleed September2004    secondary to gastritis  . Diabetes mellitus   . Adrenal adenoma     bilateral  . Detached retina   . Chronic pancreatitis   . Hyperlipidemia   . Osteopenia   . IBS (irritable bowel syndrome)   . Legally blind   . Anxiety   . Osteoarthritis   . Diverticulosis     Past Surgical History  Procedure Laterality Date  . Colonoscopy  08/15/05    few tiny diverticula at sigmoid  colon/external hemorrhoids but no polyps  . Right mastectomy    . Ectopic pregnancy surgery  1950's  . Tacking up of her bladder    . Laparoscopic cholecystectomy    . Ercp with sphincterotomy  07/2002  . Pancreatic pseudocyst drainage  June2004    drained percutaneously   . Esophagogastroduodenoscopy       Gastritis of body.  Otherwise normal  . Colonoscopy  01/08/2012    MHD:[id number] diverticulosis. Next colonoscopy in 12/2016  . Esophagogastroduodenoscopy  01/08/2012    RMR: Few scattered gastric erosions of uncertain significance-status post biopsy. Minimal chronic inflammation, no H.pylori  . Cardiac catheterization N/A 08/24/2014    Procedure: Left Heart Cath and Coronary Angiography;  Surgeon: Leonie Man, MD;  Location: [facility];  Service: Cardiovascular;  Laterality: N/A;  . Coronary artery bypass graft N/A 08/24/2014    Procedure: CORONARY ARTERY BYPASS GRAFTING (CABG) x three, using left internal mammary artery and right leg greater saphenous vein harvested endoscopically;  Surgeon: Ivin Poot, MD;  Location: [facility];  Service: Open Heart Surgery;  Laterality: N/A;  . Tee without cardioversion N/A 08/24/2014    Procedure: TRANSESOPHAGEAL ECHOCARDIOGRAM (TEE);  Surgeon: Ivin Poot, MD;  Location: [facility];  Service: Open Heart Surgery;  Laterality: N/A;     Current Outpatient Prescriptions  Medication Sig Dispense Refill  . acetaminophen (TYLENOL) 500 MG tablet Take 500-1,000 mg by mouth 2 (two) times daily as needed. Pain.    Marland Kitchen amLODipine (NORVASC) 10 MG tablet Take 1 tablet (10 mg total) by mouth daily. 90 tablet 3  . aspirin EC 81 MG EC tablet Take 1 tablet (81 mg total) by mouth daily.    . cetirizine (ZYRTEC) 10 MG tablet TAKE ONE TABLET BY MOUTH ONCE DAILY 90 tablet 3  . CREON 24000 UNITS CPEP 2 w/ meals, 1 w/ snacks (Patient taking differently: Take 2 capsules by mouth 3 (three) times daily with meals. ) 720 capsule 3  . cycloSPORINE (RESTASIS) 0.05 %  ophthalmic emulsion Place 1 drop into both eyes 3 (three) times daily.     Marland Kitchen docusate sodium (COLACE) 100 MG capsule Take 1 capsule (100 mg total) by mouth 2 (two) times daily as needed for mild constipation. 60 capsule 5  . enalapril (VASOTEC) 20 MG tablet Take 1 tablet (20 mg total) by mouth daily. 90 tablet 3  . feeding supplement, ENSURE ENLIVE, (ENSURE ENLIVE) LIQD Take 237 mLs by mouth 2 (two) times daily between meals. 237 mL 12  . furosemide (LASIX) 20 MG tablet Take 1 tablet (20 mg total) by mouth daily as needed. 30 tablet 5  . hydrALAZINE (APRESOLINE) 10 MG tablet Take 1 tablet (10 mg total) by mouth every 8 (eight) hours. 90 tablet 1  . Insulin Syringes, Disposable, U-100 1 ML MISC Pt uses 8 mm 31 g 300 each 3  . LORazepam (ATIVAN) 0.5 MG tablet TAKE ONE TABLET BY MOUTH EVERY 8 HOURS AS NEEDED ANXIETY  OR  AGITATION 30 tablet 0  . metFORMIN (GLUCOPHAGE-XR) 500 MG 24 hr tablet Take 500 mg by mouth daily with breakfast.    . metoprolol (LOPRESSOR) 50 MG tablet Take 1 tablet (50 mg total) by mouth 2 (two) times daily. 60 tablet 5  . Multiple Vitamin (MULTIVITAMIN) capsule Take 1 capsule by mouth daily.    Marland Kitchen omeprazole (PRILOSEC) 40 MG capsule Take 1 capsule (40 mg total) by mouth daily. 30 capsule 11  . oxyCODONE (OXY IR/ROXICODONE) 5 MG immediate release tablet Take one half tablet by mouth BID prn severe pain 30 tablet 0  . potassium chloride (K-DUR,KLOR-CON) 10 MEQ tablet Take 1 tablet (10 mEq total) by mouth daily. 30 tablet 5  . promethazine (PHENERGAN) 12.5 MG tablet Take 0.5-1 tablets (6.25-12.5 mg total) by mouth every 6 (six) hours as needed for nausea or vomiting. 30 tablet 0  . zolpidem (AMBIEN) 10 MG tablet TAKE ONE TABLET BY MOUTH AT BEDTIME AS NEEDED FOR INSOMNIA 30 tablet 0  . rosuvastatin (CRESTOR) 20 MG tablet Take 1 tablet (20 mg total) by mouth daily. 90 tablet 3   No current facility-administered medications for this visit.    Allergies:   Calcium-containing  compounds; Raloxifene; and Vitamin d analogs    Social History:  The patient  reports that she has never smoked. She does not have any smokeless tobacco history on file. She reports that she does not drink alcohol or use illicit drugs.   Family History:  The patient's family history includes Colon cancer (age of onset: 61) in her brother; Colon cancer (age of onset: 72) in her father; Heart attack in her brother; Kidney disease in her daughter; Leukemia in her son; Ovarian cancer in her mother; Pancreatitis in her brother.    ROS: All other systems are reviewed and negative. Unless otherwise mentioned in H&P  PHYSICAL EXAM: VS:  BP 138/68 mmHg  Pulse 75  Ht 5\' 3"  (1.6 m)  Wt 136 lb 12.8 oz (62.052 kg)  BMI 24.24 kg/m2  SpO2 96% , BMI Body mass index is 24.24 kg/(m^2). GEN: Well nourished, well developed, in no acute distress HEENT: normal Neck: no JVD, carotid bruits, or masses Cardiac: RRR; no murmurs, rubs, or gallops,no edema  Respiratory:  clear to auscultation bilaterally, normal work of breathing GI: soft, nontender, nondistended, + BS MS: no deformity or atrophy Skin: warm and dry, no rash Neuro:  Strength and sensation are intact Psych: euthymic mood, full affect  Recent Labs: 08/25/2014: Magnesium 2.1 10/18/2014: ALT 22; Hemoglobin 10.4*; Platelets 117* 11/27/2014: BUN 26*; Creatinine, Ser 1.03*; Potassium 3.8; Sodium 132*    Lipid Panel    Component Value Date/Time   CHOL 80* 10/18/2014 0830   TRIG 47 10/18/2014 0830   TRIG 90 10/15/2011 1538   HDL 54 10/18/2014 0830   CHOLHDL 1.5 10/18/2014 0830   VLDL 9 10/18/2014 0830   LDLCALC 17 10/18/2014 0830      Wt Readings from Last 3 Encounters:  12/14/14 136 lb 12.8 oz (62.052 kg)  11/27/14 134 lb 9.6 oz (61.054 kg)  11/22/14 134 lb (60.782 kg)     ASSESSMENT AND PLAN:  1.  CAD: status post coronary artery bypass grafting.  She is feeling well participating in cardiac rehabilitation and tolerating her  medications.  I will see her again in 6 months unless she becomes symptomatic.  I have encouraged her to continue to work with cardiac rehabilitation and to become more active.  Multiple questions were answered.she needs to be established with one of our cardiologists on the next office visit.  2. Hypercholesterolemia: she is tolerating Crestor 20 mg daily, better than the 40 mg daily.  I have changed her prescription to 20 mg daily, so that she does not have to cut the pills in half.  3. Situational depression: I suggested that she speak with those at [facility] to help care for her husband prior to his death about any grief counseling or support groups that would be helpful to her.   Current medicines are reviewed at length with the patient today.    Labs/ tests ordered today include: None No orders of the defined types were placed in this encounter.     Disposition:   FU with 6 months with CARDIOLOGIST Signed, Jory Sims, NP  12/14/2014 1:34 PM    [facility] [address], [place], [place] [address] Phone: [phone]; Fax: [phone]

## 2014-12-15 ENCOUNTER — Encounter (HOSPITAL_COMMUNITY)
Admit: 2014-12-15 | Discharge: 2014-12-15 | Disposition: A | Discharge status: Home / Self Care | Payer: Medicare HMO | Attending: Cardiovascular Disease | Admitting: Cardiovascular Disease

## 2014-12-15 DIAGNOSIS — I252 Old myocardial infarction: Secondary | ICD-10-CM | POA: Diagnosis not present

## 2014-12-18 ENCOUNTER — Encounter: Payer: Self-pay | Admitting: Family Medicine

## 2014-12-18 ENCOUNTER — Ambulatory Visit (INDEPENDENT_AMBULATORY_CARE_PROVIDER_SITE_OTHER): Payer: Medicare HMO | Admitting: Family Medicine

## 2014-12-18 ENCOUNTER — Encounter (HOSPITAL_COMMUNITY)
Admission: RE | Admit: 2014-12-18 | Discharge: 2014-12-18 | Disposition: A | Discharge status: Home / Self Care | Payer: Medicare HMO | Source: Ambulatory Visit | Attending: Cardiovascular Disease | Admitting: Cardiovascular Disease

## 2014-12-18 ENCOUNTER — Other Ambulatory Visit: Payer: Self-pay | Admitting: Family Medicine

## 2014-12-18 VITALS — BP 156/68 | HR 78 | Temp 97.9°F | Resp 16 | Ht 63.0 in | Wt 140.0 lb

## 2014-12-18 DIAGNOSIS — I252 Old myocardial infarction: Secondary | ICD-10-CM | POA: Insufficient documentation

## 2014-12-18 DIAGNOSIS — F32A Depression, unspecified: Secondary | ICD-10-CM

## 2014-12-18 DIAGNOSIS — Z951 Presence of aortocoronary bypass graft: Secondary | ICD-10-CM | POA: Insufficient documentation

## 2014-12-18 DIAGNOSIS — Z23 Encounter for immunization: Secondary | ICD-10-CM

## 2014-12-18 DIAGNOSIS — F329 Major depressive disorder, single episode, unspecified: Secondary | ICD-10-CM

## 2014-12-18 MED ORDER — FLUOXETINE HCL 20 MG PO TABS: 20.0000 mg | ORAL_TABLET | Freq: Every day | ORAL | Status: DC

## 2014-12-18 NOTE — Addendum Note (Signed)
Addended by: Shary Decamp B on: 12/18/2014 03:39 PM   Modules accepted: Orders

## 2014-12-18 NOTE — Telephone Encounter (Signed)
?   OK to Refill  

## 2014-12-18 NOTE — Telephone Encounter (Signed)
ok 

## 2014-12-18 NOTE — Progress Notes (Signed)
Subjective:    Patient ID: Misty Edwards, female    DOB: Jan 17, 1927, 81 y.o.   MRN: [id number]  HPI Patient's husband of 42 years passed away at the end of 11-05-22. Patient is extremely depressed. She reports uncontrollable sadness, poor energy, poor concentration, and worsening insomnia. She is unable to sleep at night because she cannot stop thinking about her husband. She perseverates over memories of him and sadness to use her awake. She has actually been taking Ativan in addition to Ambien at night. Initially Ativan was prescribed to be taken during the day as needed for anxiety and Ambien as prescribed at night for insomnia. Unfortunately the patient has been using them both at night. Past Medical History  Diagnosis Date  . Hypertension   . History of carcinoma in situ of breast 1988  . Acute biliary pancreatitis 07/2002    thia was in 07/2004:she still has pseudocyst in tail of pancreas measuring 54 x 33 mm   . Upper GI bleed September2004    secondary to gastritis  . Diabetes mellitus ([place])   . Adrenal adenoma     bilateral  . Detached retina   . Chronic pancreatitis ([place])   . Hyperlipidemia   . Osteopenia   . IBS (irritable bowel syndrome)   . Legally blind   . Anxiety   . Osteoarthritis   . Diverticulosis    Past Surgical History  Procedure Laterality Date  . Colonoscopy  08/15/05    few tiny diverticula at sigmoid colon/external hemorrhoids but no polyps  . Right mastectomy    . Ectopic pregnancy surgery  1950's  . Tacking up of her bladder    . Laparoscopic cholecystectomy    . Ercp with sphincterotomy  07/2002  . Pancreatic pseudocyst drainage  June2004    drained percutaneously   . Esophagogastroduodenoscopy       Gastritis of body.  Otherwise normal  . Colonoscopy  01/08/2012    TWS:[id number] diverticulosis. Next colonoscopy in 12/2016  . Esophagogastroduodenoscopy  01/08/2012    RMR: Few scattered gastric erosions of uncertain significance-status post biopsy.  Minimal chronic inflammation, no H.pylori  . Cardiac catheterization N/A 08/24/2014    Procedure: Left Heart Cath and Coronary Angiography;  Surgeon: Leonie Man, MD;  Location: [facility];  Service: Cardiovascular;  Laterality: N/A;  . Coronary artery bypass graft N/A 08/24/2014    Procedure: CORONARY ARTERY BYPASS GRAFTING (CABG) x three, using left internal mammary artery and right leg greater saphenous vein harvested endoscopically;  Surgeon: Ivin Poot, MD;  Location: [facility];  Service: Open Heart Surgery;  Laterality: N/A;  . Tee without cardioversion N/A 08/24/2014    Procedure: TRANSESOPHAGEAL ECHOCARDIOGRAM (TEE);  Surgeon: Ivin Poot, MD;  Location: [facility];  Service: Open Heart Surgery;  Laterality: N/A;   Current Outpatient Prescriptions on File Prior to Visit  Medication Sig Dispense Refill  . acetaminophen (TYLENOL) 500 MG tablet Take 500-1,000 mg by mouth 2 (two) times daily as needed. Pain.    Marland Kitchen amLODipine (NORVASC) 10 MG tablet Take 1 tablet (10 mg total) by mouth daily. 90 tablet 3  . aspirin EC 81 MG EC tablet Take 1 tablet (81 mg total) by mouth daily.    . cetirizine (ZYRTEC) 10 MG tablet TAKE ONE TABLET BY MOUTH ONCE DAILY 90 tablet 3  . CREON 24000 UNITS CPEP 2 w/ meals, 1 w/ snacks (Patient taking differently: Take 2 capsules by mouth 3 (three) times daily with meals. )  720 capsule 3  . cycloSPORINE (RESTASIS) 0.05 % ophthalmic emulsion Place 1 drop into both eyes 3 (three) times daily.     Marland Kitchen docusate sodium (COLACE) 100 MG capsule Take 1 capsule (100 mg total) by mouth 2 (two) times daily as needed for mild constipation. 60 capsule 5  . enalapril (VASOTEC) 20 MG tablet Take 1 tablet (20 mg total) by mouth daily. 90 tablet 3  . feeding supplement, ENSURE ENLIVE, (ENSURE ENLIVE) LIQD Take 237 mLs by mouth 2 (two) times daily between meals. 237 mL 12  . furosemide (LASIX) 20 MG tablet Take 1 tablet (20 mg total) by mouth daily as needed. 30 tablet 5  .  hydrALAZINE (APRESOLINE) 10 MG tablet Take 1 tablet (10 mg total) by mouth every 8 (eight) hours. 90 tablet 1  . Insulin Syringes, Disposable, U-100 1 ML MISC Pt uses 8 mm 31 g 300 each 3  . LORazepam (ATIVAN) 0.5 MG tablet TAKE ONE TABLET BY MOUTH EVERY 8 HOURS AS NEEDED ANXIETY  OR  AGITATION 30 tablet 0  . metFORMIN (GLUCOPHAGE-XR) 500 MG 24 hr tablet Take 500 mg by mouth daily with breakfast.    . metoprolol (LOPRESSOR) 50 MG tablet Take 1 tablet (50 mg total) by mouth 2 (two) times daily. 60 tablet 5  . Multiple Vitamin (MULTIVITAMIN) capsule Take 1 capsule by mouth daily.    Marland Kitchen omeprazole (PRILOSEC) 40 MG capsule Take 1 capsule (40 mg total) by mouth daily. 30 capsule 11  . oxyCODONE (OXY IR/ROXICODONE) 5 MG immediate release tablet Take one half tablet by mouth BID prn severe pain 30 tablet 0  . potassium chloride (K-DUR,KLOR-CON) 10 MEQ tablet Take 1 tablet (10 mEq total) by mouth daily. 30 tablet 5  . promethazine (PHENERGAN) 12.5 MG tablet Take 0.5-1 tablets (6.25-12.5 mg total) by mouth every 6 (six) hours as needed for nausea or vomiting. 30 tablet 0  . rosuvastatin (CRESTOR) 20 MG tablet Take 1 tablet (20 mg total) by mouth daily. 90 tablet 3  . zolpidem (AMBIEN) 10 MG tablet TAKE ONE TABLET BY MOUTH AT BEDTIME AS NEEDED FOR INSOMNIA 30 tablet 0   No current facility-administered medications on file prior to visit.   Marland Kitchenalll Social History   Social History  . Marital Status: Married    Spouse Name: N/A  . Number of Children: 5  . Years of Education: N/A   Occupational History  . homemaker    Social History Main Topics  . Smoking status: Never Smoker   . Smokeless tobacco: Not on file  . Alcohol Use: No  . Drug Use: No  . Sexual Activity: No   Other Topics Concern  . Not on file   Social History Narrative   Lives in [place].      Review of Systems  All other systems reviewed and are negative.      Objective:   Physical Exam  Constitutional: She appears  well-developed and well-nourished.  Cardiovascular: Normal rate, regular rhythm and normal heart sounds.   No murmur heard. Pulmonary/Chest: Effort normal and breath sounds normal. No respiratory distress. She has no wheezes. She has no rales.  Psychiatric: Her speech is normal and behavior is normal. Judgment and thought content normal. Cognition and memory are normal. She exhibits a depressed mood.  Vitals reviewed.         Assessment & Plan:  Depression - Plan: FLUoxetine (PROZAC) 20 MG tablet  Patient is definitely grieving but I believe her grieving has turned into  a clinical depression. I believe this is contributing to her insomnia. Furthermore given her age, I'm concerned about stronger sedative medication. Therefore I recommended that she discontinue Ambien. I will treat the patient for depression using Prozac 20 mg by mouth daily. She can use Ativan 0.5-1 mg at night to help her sleep until the Prozac takes full effect. Recheck here in one month

## 2014-12-20 ENCOUNTER — Other Ambulatory Visit: Payer: Self-pay | Admitting: Family Medicine

## 2014-12-20 ENCOUNTER — Encounter (HOSPITAL_COMMUNITY)
Admit: 2014-12-20 | Discharge: 2014-12-20 | Disposition: A | Discharge status: Home / Self Care | Payer: Medicare HMO | Attending: Cardiovascular Disease | Admitting: Cardiovascular Disease

## 2014-12-20 DIAGNOSIS — Z951 Presence of aortocoronary bypass graft: Secondary | ICD-10-CM | POA: Diagnosis not present

## 2014-12-20 DIAGNOSIS — I252 Old myocardial infarction: Secondary | ICD-10-CM | POA: Diagnosis not present

## 2014-12-20 NOTE — Telephone Encounter (Signed)
LRF 11/16/14 #30  LOV 12/18/14  OK refill?

## 2014-12-21 NOTE — Telephone Encounter (Signed)
We stopped this at [facility]

## 2014-12-21 NOTE — Telephone Encounter (Signed)
Ambien returned denied to pharmacy

## 2014-12-22 ENCOUNTER — Encounter (HOSPITAL_COMMUNITY)
Admit: 2014-12-22 | Discharge: 2014-12-22 | Disposition: A | Discharge status: Home / Self Care | Payer: Medicare HMO | Attending: Cardiovascular Disease | Admitting: Cardiovascular Disease

## 2014-12-22 DIAGNOSIS — Z951 Presence of aortocoronary bypass graft: Secondary | ICD-10-CM | POA: Diagnosis not present

## 2014-12-22 DIAGNOSIS — I252 Old myocardial infarction: Secondary | ICD-10-CM | POA: Diagnosis not present

## 2014-12-25 ENCOUNTER — Telehealth: Payer: Self-pay | Admitting: Family Medicine

## 2014-12-25 ENCOUNTER — Encounter (HOSPITAL_COMMUNITY): Payer: Medicare HMO

## 2014-12-25 NOTE — Telephone Encounter (Signed)
Daughter calling.  Reporting high blood sugars  Saturday fasting 165 at 8A ate Bkft  At 11AM was 509.  Sunday fasting 182, 2hr PP 244.  Today 104 at 515AM,  715AM was 320 (not quite 2 hrs after eating)  But cardiac rehab would not see her because sugar was high.  Daughter thinks high sugars are from Prozac??  Please advise.  States pt did not take Prozac yesterday.  Also says she has been taking insulin Novolin and Novolog, neither on medication list???

## 2014-12-26 NOTE — Telephone Encounter (Signed)
Pt daughter called stating that pt is taking 30units of Novolin and if her BS are higher than 250 then she takes 4u more of the Novolin, states is no longer on Novolog (has not been taking since June).

## 2014-12-26 NOTE — Telephone Encounter (Signed)
At nursing home, she was on novolin N (NPH ) 20 units in the am with novolog at meals.  Please verify dose of nph and novolog she has been taking.  I will likely increase NPH to BID dosing.

## 2014-12-27 ENCOUNTER — Encounter (HOSPITAL_COMMUNITY)
Admit: 2014-12-27 | Discharge: 2014-12-27 | Disposition: A | Discharge status: Home / Self Care | Payer: Medicare HMO | Attending: Cardiovascular Disease | Admitting: Cardiovascular Disease

## 2014-12-27 DIAGNOSIS — I252 Old myocardial infarction: Secondary | ICD-10-CM | POA: Diagnosis not present

## 2014-12-27 DIAGNOSIS — Z951 Presence of aortocoronary bypass graft: Secondary | ICD-10-CM | POA: Diagnosis not present

## 2014-12-29 ENCOUNTER — Other Ambulatory Visit: Payer: Self-pay | Admitting: Family Medicine

## 2014-12-29 ENCOUNTER — Encounter (HOSPITAL_COMMUNITY): Payer: Medicare HMO

## 2014-12-29 DIAGNOSIS — F329 Major depressive disorder, single episode, unspecified: Secondary | ICD-10-CM

## 2014-12-29 DIAGNOSIS — F32A Depression, unspecified: Secondary | ICD-10-CM

## 2014-12-29 NOTE — Telephone Encounter (Signed)
Contacted pt daughter and she is aware of message but wanted to know if you could pt her back on ambien vs Prozac,feels like prozac was causing her BS to increase, pt stopped taking the prozac and noticed her BS went back to normal.  Also wanted too know if pt could take 36units in the am and if BS drops then will take 2u at bedtime.  Please advise!

## 2014-12-29 NOTE — Telephone Encounter (Signed)
Change novolin to 30 in the am and 10 in the afternoon.  Recheck here in 2 weeks.

## 2014-12-29 NOTE — Telephone Encounter (Signed)
Ok to refill??  Last office visit 12/18/2014.  Last refill 11/16/2014.

## 2014-12-29 NOTE — Telephone Encounter (Signed)
We discontinued this at the last office visit. Please see the last office visit for details

## 2014-12-29 NOTE — Telephone Encounter (Signed)
Returned again denied to pharmacy

## 2015-01-01 ENCOUNTER — Encounter (HOSPITAL_COMMUNITY): Payer: Medicare HMO

## 2015-01-01 MED ORDER — FLUOXETINE HCL 20 MG PO TABS: 20.0000 mg | ORAL_TABLET | Freq: Every day | ORAL | Status: DC

## 2015-01-01 NOTE — Telephone Encounter (Signed)
Prozac should NOT increase her sugars.  The insulin she is taking only lasts 12 hours and needs to be spread out to better control her sugars.  I would give 30 in am and 10 in afternoon rather than just give more in the morning.  Prozac will take 4 weeks to take effect.  Please give it some time.

## 2015-01-01 NOTE — Telephone Encounter (Signed)
Ok to refill??  Last office visit 12/18/2014.  Last refill 11/16/2014.

## 2015-01-01 NOTE — Telephone Encounter (Signed)
Medication called to pharmacy. 

## 2015-01-01 NOTE — Telephone Encounter (Signed)
Allright, refill ambien but stay on prozac.

## 2015-01-01 NOTE — Telephone Encounter (Signed)
Contacted pt's daughter and states they do not want to just do 30 units in the am and 10 u pm, states that last pm son had checked BS it was 258 so he gave her 4 units last night and this am was 84 so feels like what they should do.   Also states that pt is no longer taking the Prozac because pt had a scare when her BS went up this was causing it. I informed daughter prozac shouuld not be rising there BS but was still adament. I advised her that if she is still concerned then would need to come in for visit and discuss.   Pt has appt 01/02/15 at 330pm

## 2015-01-02 ENCOUNTER — Ambulatory Visit (INDEPENDENT_AMBULATORY_CARE_PROVIDER_SITE_OTHER): Payer: Medicare HMO | Admitting: Family Medicine

## 2015-01-02 ENCOUNTER — Encounter: Payer: Self-pay | Admitting: Family Medicine

## 2015-01-02 VITALS — BP 140/70 | HR 74 | Temp 98.5°F | Resp 16 | Ht 63.0 in | Wt 136.0 lb

## 2015-01-02 DIAGNOSIS — Z794 Long term (current) use of insulin: Secondary | ICD-10-CM

## 2015-01-02 DIAGNOSIS — E119 Type 2 diabetes mellitus without complications: Secondary | ICD-10-CM

## 2015-01-02 DIAGNOSIS — F329 Major depressive disorder, single episode, unspecified: Secondary | ICD-10-CM

## 2015-01-02 DIAGNOSIS — F32A Depression, unspecified: Secondary | ICD-10-CM

## 2015-01-02 MED ORDER — ZOLPIDEM TARTRATE 10 MG PO TABS: 10.0000 mg | ORAL_TABLET | Freq: Every evening | ORAL | Status: DC | PRN

## 2015-01-02 NOTE — Progress Notes (Signed)
Subjective:    Patient ID: Misty Edwards, female    DOB: 1926/09/29, 81 y.o.   MRN: [id number]  HPI 12/18/14 Patient's husband of 42 years passed away at the end of 11/11/22. Patient is extremely depressed. She reports uncontrollable sadness, poor energy, poor concentration, and worsening insomnia. She is unable to sleep at night because she cannot stop thinking about her husband. She perseverates over memories of him and sadness to use her awake. She has actually been taking Ativan in addition to Ambien at night. Initially Ativan was prescribed to be taken during the day as needed for anxiety and Ambien as prescribed at night for insomnia. Unfortunately the patient has been using them both at night.  At that time, my plan was: Patient is definitely grieving but I believe her grieving has turned into a clinical depression. I believe this is contributing to her insomnia. Furthermore given her age, I'm concerned about stronger sedative medication. Therefore I recommended that she discontinue Ambien. I will treat the patient for depression using Prozac 20 mg by mouth daily. She can use Ativan 0.5-1 mg at night to help her sleep until the Prozac takes full effect. Recheck here in one month  01/02/15 Shortly thereafter, the patient called [place] complaining of sugars are out of control. She is taking 30 units of Novolin every morning and no afternoon dose. Frequently her sugars are greater than 250 in the afternoons. She blames this on the Prozac and therefore she wanted to quit the Prozac. She also wanted to resume the Ambien to help her sleep. I asked her to come in today to discuss this further. Please see our numerous telephone conversations over the last 2 weeks. She is very scared to take an afternoon dose of Novolin. She is worried about hypoglycemia later in the evening. However in the afternoons her sugars are in the 200s reflective of the fact that the Novolin is wearing off later in the afternoons. She  is taking Novolin 30 units in the morning around 7 AM. Past Medical History  Diagnosis Date  . Hypertension   . History of carcinoma in situ of breast 1988  . Acute biliary pancreatitis 07/2002    thia was in 07/2004:she still has pseudocyst in tail of pancreas measuring 54 x 33 mm   . Upper GI bleed September2004    secondary to gastritis  . Diabetes mellitus ([place])   . Adrenal adenoma     bilateral  . Detached retina   . Chronic pancreatitis ([place])   . Hyperlipidemia   . Osteopenia   . IBS (irritable bowel syndrome)   . Legally blind   . Anxiety   . Osteoarthritis   . Diverticulosis    Past Surgical History  Procedure Laterality Date  . Colonoscopy  08/15/05    few tiny diverticula at sigmoid colon/external hemorrhoids but no polyps  . Right mastectomy    . Ectopic pregnancy surgery  1950's  . Tacking up of her bladder    . Laparoscopic cholecystectomy    . Ercp with sphincterotomy  07/2002  . Pancreatic pseudocyst drainage  June2004    drained percutaneously   . Esophagogastroduodenoscopy       Gastritis of body.  Otherwise normal  . Colonoscopy  01/08/2012    BOF:[id number] diverticulosis. Next colonoscopy in 12/2016  . Esophagogastroduodenoscopy  01/08/2012    RMR: Few scattered gastric erosions of uncertain significance-status post biopsy. Minimal chronic inflammation, no H.pylori  . Cardiac catheterization N/A 08/24/2014  Procedure: Left Heart Cath and Coronary Angiography;  Surgeon: Leonie Man, MD;  Location: [facility];  Service: Cardiovascular;  Laterality: N/A;  . Coronary artery bypass graft N/A 08/24/2014    Procedure: CORONARY ARTERY BYPASS GRAFTING (CABG) x three, using left internal mammary artery and right leg greater saphenous vein harvested endoscopically;  Surgeon: Ivin Poot, MD;  Location: [facility];  Service: Open Heart Surgery;  Laterality: N/A;  . Tee without cardioversion N/A 08/24/2014    Procedure: TRANSESOPHAGEAL ECHOCARDIOGRAM (TEE);  Surgeon:  Ivin Poot, MD;  Location: [facility];  Service: Open Heart Surgery;  Laterality: N/A;   Current Outpatient Prescriptions on File Prior to Visit  Medication Sig Dispense Refill  . acetaminophen (TYLENOL) 500 MG tablet Take 500-1,000 mg by mouth 2 (two) times daily as needed. Pain.    Marland Kitchen amLODipine (NORVASC) 10 MG tablet Take 1 tablet (10 mg total) by mouth daily. 90 tablet 3  . aspirin EC 81 MG EC tablet Take 1 tablet (81 mg total) by mouth daily.    . cetirizine (ZYRTEC) 10 MG tablet TAKE ONE TABLET BY MOUTH ONCE DAILY 90 tablet 3  . CREON 24000 UNITS CPEP 2 w/ meals, 1 w/ snacks (Patient taking differently: Take 2 capsules by mouth 3 (three) times daily with meals. ) 720 capsule 3  . cycloSPORINE (RESTASIS) 0.05 % ophthalmic emulsion Place 1 drop into both eyes 3 (three) times daily.     Marland Kitchen docusate sodium (COLACE) 100 MG capsule Take 1 capsule (100 mg total) by mouth 2 (two) times daily as needed for mild constipation. 60 capsule 5  . enalapril (VASOTEC) 20 MG tablet Take 1 tablet (20 mg total) by mouth daily. 90 tablet 3  . feeding supplement, ENSURE ENLIVE, (ENSURE ENLIVE) LIQD Take 237 mLs by mouth 2 (two) times daily between meals. 237 mL 12  . FLUoxetine (PROZAC) 20 MG tablet Take 1 tablet (20 mg total) by mouth daily. 30 tablet 3  . furosemide (LASIX) 20 MG tablet Take 1 tablet (20 mg total) by mouth daily as needed. 30 tablet 5  . hydrALAZINE (APRESOLINE) 10 MG tablet Take 1 tablet (10 mg total) by mouth every 8 (eight) hours. 90 tablet 1  . Insulin Syringes, Disposable, U-100 1 ML MISC Pt uses 8 mm 31 g 300 each 3  . LORazepam (ATIVAN) 0.5 MG tablet TAKE ONE TABLET BY MOUTH EVERY 8 HOURS AS NEEDED ANXIETY  OR  AGITATION 30 tablet 0  . metFORMIN (GLUCOPHAGE-XR) 500 MG 24 hr tablet Take 500 mg by mouth daily with breakfast.    . metoprolol (LOPRESSOR) 50 MG tablet Take 1 tablet (50 mg total) by mouth 2 (two) times daily. 60 tablet 5  . Multiple Vitamin (MULTIVITAMIN) capsule Take 1 capsule  by mouth daily.    Marland Kitchen omeprazole (PRILOSEC) 40 MG capsule Take 1 capsule (40 mg total) by mouth daily. 30 capsule 11  . oxyCODONE (OXY IR/ROXICODONE) 5 MG immediate release tablet Take one half tablet by mouth BID prn severe pain 30 tablet 0  . potassium chloride (K-DUR,KLOR-CON) 10 MEQ tablet Take 1 tablet (10 mEq total) by mouth daily. 30 tablet 5  . promethazine (PHENERGAN) 12.5 MG tablet Take 0.5-1 tablets (6.25-12.5 mg total) by mouth every 6 (six) hours as needed for nausea or vomiting. 30 tablet 0  . rosuvastatin (CRESTOR) 20 MG tablet Take 1 tablet (20 mg total) by mouth daily. 90 tablet 3  . zolpidem (AMBIEN) 10 MG tablet TAKE ONE TABLET  BY MOUTH AT BEDTIME AS NEEDED FOR INSOMNIA 30 tablet 0   No current facility-administered medications on file prior to visit.   Marland Kitchenalll Social History   Social History  . Marital Status: Married    Spouse Name: N/A  . Number of Children: 5  . Years of Education: N/A   Occupational History  . homemaker    Social History Main Topics  . Smoking status: Never Smoker   . Smokeless tobacco: Not on file  . Alcohol Use: No  . Drug Use: No  . Sexual Activity: No   Other Topics Concern  . Not on file   Social History Narrative   Lives in [place].      Review of Systems  All other systems reviewed and are negative.      Objective:   Physical Exam  Constitutional: She appears well-developed and well-nourished.  Cardiovascular: Normal rate, regular rhythm and normal heart sounds.   No murmur heard. Pulmonary/Chest: Effort normal and breath sounds normal. No respiratory distress. She has no wheezes. She has no rales.  Psychiatric: Her speech is normal and behavior is normal. Judgment and thought content normal. Cognition and memory are normal. She exhibits a depressed mood.  Vitals reviewed.         Assessment & Plan:  Depression  Insulin-requiring or dependent type II diabetes mellitus ([place])  I tried to explain to the patient  the Novolin only lasts 6-8 hours. I tried to explain to the patient needs to take this twice a day to spread the insulin out to give herself for 24-hour coverage. Therefore I'll recommend 20 units in the morning and 10 units in the afternoon at suppertime. I would also recommend an evening snack. I believe that she should not experience hypoglycemia since this is her normal daily dosage of insulin just spread out more evenly throughout the day. I would like to hear her fasting sugars in the morning and her 2 hour postprandial sugars in the evening in one week. She does not want to take Prozac right now. She would like to take Ambien to help her sleep at least until we get her sugars under control

## 2015-01-03 ENCOUNTER — Encounter (HOSPITAL_COMMUNITY): Payer: Medicare HMO

## 2015-01-03 NOTE — Progress Notes (Signed)
03/05/2015. Patients daughter called to advise [place] that the family thought it was best for patient to quit the program at this time due to having symptoms from changing of meds.

## 2015-01-05 ENCOUNTER — Encounter (HOSPITAL_COMMUNITY): Payer: Medicare HMO

## 2015-01-06 ENCOUNTER — Other Ambulatory Visit: Payer: Self-pay | Admitting: Family Medicine

## 2015-01-08 ENCOUNTER — Encounter (HOSPITAL_COMMUNITY): Payer: Medicare HMO

## 2015-01-10 ENCOUNTER — Encounter (HOSPITAL_COMMUNITY): Payer: Medicare HMO

## 2015-01-12 ENCOUNTER — Encounter (HOSPITAL_COMMUNITY): Payer: Medicare HMO

## 2015-01-13 ENCOUNTER — Other Ambulatory Visit: Payer: Self-pay | Admitting: Family Medicine

## 2015-01-15 NOTE — Telephone Encounter (Signed)
ok 

## 2015-01-15 NOTE — Telephone Encounter (Signed)
?   OK to Refill  

## 2015-01-18 ENCOUNTER — Ambulatory Visit: Payer: Medicare HMO | Admitting: Family Medicine

## 2015-01-20 DIAGNOSIS — R69 Illness, unspecified: Secondary | ICD-10-CM | POA: Diagnosis not present

## 2015-01-30 ENCOUNTER — Telehealth: Payer: Self-pay | Admitting: Adult Health

## 2015-01-30 ENCOUNTER — Telehealth: Payer: Self-pay | Admitting: Family Medicine

## 2015-01-30 NOTE — Telephone Encounter (Signed)
[name] (patient's daughter) called [place] as instructed by Dr. Samella Parr office below.   Patient's HR is consistently in the 50s with an occassional reading in the lower 60s. Patient is tired and nauseous.

## 2015-01-30 NOTE — Telephone Encounter (Signed)
  She can decrease the metoprolol to 25 mg BID and see if she feels better. Let us know in a couple of days.

## 2015-01-30 NOTE — Telephone Encounter (Signed)
I spoke with daughter and she agreed to reduce dose of metoprolol to 25 mg twice a day and call us back in a few days and see how she is doing

## 2015-01-30 NOTE — Telephone Encounter (Signed)
[name] [name] states that for that past few days her mother's heart rate has been in the mid 50's to low 60's. She states that she has had no energy and she feels like she is going to throw up 24/7. She was complaining of ear pain and a lot of belching yesterday. Her blood pressures have been at a normal rate- 130/57, 140/57, 137/58 & 114/50 are examples of a few. She did say that she has not complained of any headaches or dizziness. Please advise

## 2015-01-30 NOTE — Telephone Encounter (Signed)
[name] called and states that the pt's HR is dropping in the 50"s and she is not feeling well at all for the past day and a half. - Recommended that she contact her cardiologist for this as WTP is oot.  Also she wanted to leave you her BS readings.  FBS - 85,71,90 2 hours after eating - 136,149,129,212,1330,155,207,149,180,261,136

## 2015-02-02 NOTE — Telephone Encounter (Signed)
Cardiology has spoken to family, metoprolol decreased

## 2015-02-13 ENCOUNTER — Telehealth: Payer: Self-pay | Admitting: Family Medicine

## 2015-02-13 DIAGNOSIS — F329 Major depressive disorder, single episode, unspecified: Secondary | ICD-10-CM

## 2015-02-13 DIAGNOSIS — F32A Depression, unspecified: Secondary | ICD-10-CM

## 2015-02-13 MED ORDER — OXYCODONE HCL 5 MG PO TABS: ORAL_TABLET | ORAL | Status: DC

## 2015-02-13 NOTE — Telephone Encounter (Signed)
[name] called requesting that her refills of Prozac (she has 2 refills remaining) be cancelled at the pharmacy and re-sent as a 90 day supply due to her insurance.  She also requests a refill of Oxycodone.   [phone]

## 2015-02-13 NOTE — Telephone Encounter (Signed)
ok 

## 2015-02-13 NOTE — Telephone Encounter (Signed)
?   OK to Refill oxy 

## 2015-02-14 MED ORDER — FLUOXETINE HCL 20 MG PO TABS: 20.0000 mg | ORAL_TABLET | Freq: Every day | ORAL | Status: DC

## 2015-02-14 NOTE — Telephone Encounter (Signed)
Medication called/sent to requested pharmacy and RX printed, left up front and patient's daughter aware to pick up

## 2015-02-14 NOTE — Addendum Note (Signed)
Addended by: Shary Decamp B on: 02/14/2015 11:18 AM   Modules accepted: Orders

## 2015-02-14 NOTE — Telephone Encounter (Signed)
Prozac was sent to [facility] in [place] for 90 day supply and [facility] was [facility] to [facility]

## 2015-03-02 ENCOUNTER — Other Ambulatory Visit: Payer: Self-pay | Admitting: Family Medicine

## 2015-03-02 MED ORDER — METOPROLOL TARTRATE 50 MG PO TABS: 50.0000 mg | ORAL_TABLET | Freq: Two times a day (BID) | ORAL | Status: DC

## 2015-03-06 ENCOUNTER — Telehealth: Payer: Self-pay | Admitting: Cardiology

## 2015-03-06 NOTE — Telephone Encounter (Signed)
Called and spoke to pt's daughter [name]) and she states that for the past two weeks the pt's bp has been running in the higher numbers.(160/88,166/90,146/80 and 159/88)  She states that the pt's pulse rate has been in the mid 50's.  She also stated that the pt was complaining of dizziness and that she feels very tired and with no energy lately as well. Please advise.

## 2015-03-06 NOTE — Telephone Encounter (Signed)
Can she come in for an office visit tomorrow or sometime this week? Would prefer she be seen in person with EKG +/- possible labs before empirically adjusting meds. Thanks. Dayna Dunn PA-C

## 2015-03-06 NOTE — Telephone Encounter (Signed)
Pt's BP has been running high in the 160's

## 2015-03-07 ENCOUNTER — Encounter: Payer: Self-pay | Admitting: Cardiology

## 2015-03-07 ENCOUNTER — Ambulatory Visit (INDEPENDENT_AMBULATORY_CARE_PROVIDER_SITE_OTHER): Payer: Medicare HMO | Admitting: Cardiology

## 2015-03-07 VITALS — BP 136/64 | HR 65 | Ht 63.0 in | Wt 135.0 lb

## 2015-03-07 DIAGNOSIS — E785 Hyperlipidemia, unspecified: Secondary | ICD-10-CM

## 2015-03-07 DIAGNOSIS — Z951 Presence of aortocoronary bypass graft: Secondary | ICD-10-CM

## 2015-03-07 DIAGNOSIS — E1159 Type 2 diabetes mellitus with other circulatory complications: Secondary | ICD-10-CM

## 2015-03-07 DIAGNOSIS — I10 Essential (primary) hypertension: Secondary | ICD-10-CM

## 2015-03-07 MED ORDER — HYDRALAZINE HCL 10 MG PO TABS: ORAL_TABLET | ORAL | Status: DC

## 2015-03-07 MED ORDER — AMLODIPINE BESYLATE 5 MG PO TABS: 5.0000 mg | ORAL_TABLET | Freq: Every day | ORAL | Status: DC

## 2015-03-07 NOTE — Assessment & Plan Note (Signed)
Pt in the office today because of labile B/P.

## 2015-03-07 NOTE — Progress Notes (Signed)
03/07/2015 Misty Edwards   17-May-1926  [id number]  Primary Physician Odette Fraction, MD Primary Cardiologist: Jory Sims NP  HPI:  Pleasant 81 y/o female with a history of CAD, s/p CABG x 3 in June 2016 after she presented with a NSTEMI. Her LVF was preserved at cath. She has recovered well. She is in the office today because her B/P has been elevated at home. The pt's family accompanied her to the office. They have a notebook log of her B/P's. They have been taking her B/P up to 6 times a day on some days. Several medication adjustment have been made and they admit they are a little confused on what she is supposed to be taking. Her metoprolol was decreased to 25 mg BID secondary to HR in th 50's and some dizziness.  She apparently is taking Amlodipine 10 mg PRN for elevated pressure (>140) instead of QD. Symptomatically she is doing well, she has occasional "flutter- flipped beat" at rest, no sustained tachycardia. No unusual dyspnea. No syncope.    Current Outpatient Prescriptions  Medication Sig Dispense Refill  . acetaminophen (TYLENOL) 500 MG tablet Take 500-1,000 mg by mouth 2 (two) times daily as needed. Pain.    Marland Kitchen amLODipine (NORVASC) 5 MG tablet Take 1 tablet (5 mg total) by mouth daily. 90 tablet 3  . aspirin EC 81 MG EC tablet Take 1 tablet (81 mg total) by mouth daily.    . cetirizine (ZYRTEC) 10 MG tablet TAKE ONE TABLET BY MOUTH ONCE DAILY 90 tablet 3  . CREON 24000 UNITS CPEP 2 w/ meals, 1 w/ snacks (Patient taking differently: Take 2 capsules by mouth 3 (three) times daily with meals. ) 720 capsule 3  . cycloSPORINE (RESTASIS) 0.05 % ophthalmic emulsion Place 1 drop into both eyes 3 (three) times daily.     Marland Kitchen docusate sodium (COLACE) 100 MG capsule Take 1 capsule (100 mg total) by mouth 2 (two) times daily as needed for mild constipation. 60 capsule 5  . enalapril (VASOTEC) 20 MG tablet Take 1 tablet (20 mg total) by mouth daily. 90 tablet 3  . feeding  supplement, ENSURE ENLIVE, (ENSURE ENLIVE) LIQD Take 237 mLs by mouth 2 (two) times daily between meals. 237 mL 12  . FLUoxetine (PROZAC) 20 MG tablet Take 1 tablet (20 mg total) by mouth daily. 90 tablet 3  . FLUoxetine (PROZAC) 20 MG tablet Take 1 tablet (20 mg total) by mouth daily. 90 tablet 3  . furosemide (LASIX) 20 MG tablet Take 1 tablet (20 mg total) by mouth daily as needed. 30 tablet 5  . hydrALAZINE (APRESOLINE) 10 MG tablet Two Times Daily As Needed for Systolic BP above 0000000 60 tablet 3  . insulin NPH Human (HUMULIN N,NOVOLIN N) 100 UNIT/ML injection Inject into the skin. 30 units qam - 4 units at night    . Insulin Syringes, Disposable, U-100 1 ML MISC Pt uses 8 mm 31 g 300 each 3  . LORazepam (ATIVAN) 0.5 MG tablet TAKE ONE TABLET BY MOUTH EVERY 8 HOURS AS NEEDED FOR ANXIETY/AGITATION 30 tablet 2  . metFORMIN (GLUCOPHAGE-XR) 500 MG 24 hr tablet Take 500 mg by mouth daily with breakfast.    . metoprolol (LOPRESSOR) 50 MG tablet Take 1 tablet (50 mg total) by mouth 2 (two) times daily. 180 tablet 3  . Multiple Vitamin (MULTIVITAMIN) capsule Take 1 capsule by mouth daily.    Marland Kitchen omeprazole (PRILOSEC) 40 MG capsule Take 1 capsule (40 mg total) by  mouth daily. 30 capsule 11  . oxyCODONE (OXY IR/ROXICODONE) 5 MG immediate release tablet Take one half tablet by mouth BID prn severe pain 30 tablet 0  . potassium chloride (K-DUR,KLOR-CON) 10 MEQ tablet Take 1 tablet (10 mEq total) by mouth daily. 30 tablet 5  . promethazine (PHENERGAN) 12.5 MG tablet Take 0.5-1 tablets (6.25-12.5 mg total) by mouth every 6 (six) hours as needed for nausea or vomiting. 30 tablet 0  . rosuvastatin (CRESTOR) 20 MG tablet Take 1 tablet (20 mg total) by mouth daily. 90 tablet 3  . zolpidem (AMBIEN) 10 MG tablet Take 1 tablet (10 mg total) by mouth at bedtime as needed. for insomnia 30 tablet 0   No current facility-administered medications for this visit.    Allergies  Allergen Reactions  . Calcium-Containing  Compounds Nausea Only  . Raloxifene Itching    Face and eyes burning  . Vitamin D Analogs Nausea Only    Social History   Social History  . Marital Status: Married    Spouse Name: N/A  . Number of Children: 5  . Years of Education: N/A   Occupational History  . homemaker    Social History Main Topics  . Smoking status: Never Smoker   . Smokeless tobacco: Not on file  . Alcohol Use: No  . Drug Use: No  . Sexual Activity: No   Other Topics Concern  . Not on file   Social History Narrative   Lives in [place].     Review of Systems: General: negative for chills, fever, night sweats or weight changes.  Cardiovascular: negative for chest pain, dyspnea on exertion, edema, orthopnea, palpitations, paroxysmal nocturnal dyspnea or shortness of breath Dermatological: negative for rash Respiratory: negative for cough or wheezing Urologic: negative for hematuria Abdominal: negative for nausea, vomiting, diarrhea, bright red blood per rectum, melena, or hematemesis Neurologic: negative for visual changes, syncope, or dizziness All other systems reviewed and are otherwise negative except as noted above.    Blood pressure 136/64, pulse 65, height 5\' 3"  (1.6 m), weight 135 lb (61.236 kg), SpO2 98 %.  General appearance: alert, cooperative, appears stated age and no distress Neck: no carotid bruit and no JVD Lungs: clear to auscultation bilaterally Heart: regular rate and rhythm Extremities: trace edema Skin: cool, pale, dry Neurologic: Grossly normal   ASSESSMENT AND PLAN:   Essential hypertension Pt in the office today because of labile B/P.  S/P CABG x 3 CABG x 18 August 2014, preserved LVF at cath  Dyslipidemia In Aug 2016 her cholesterol was 80, with an LDL of 17! Her Crestor was decreased then from 40 mg to 20 mg.  Type 2 diabetes mellitus with vascular disease ([place]) Followed by PCP    PLAN  I suggested [name] take Amlodipine 5 mg daily (she has some LE  edema and I'm hesitant to use 10 mg) and change her Apresoline 10 mg TID - to 10 mg BID PRN for systolic B/P > 0000000.   I suggested she take her B/P only once a day at noon (she takes her medications in the am). I told her we will accept a B/P of [id number] systolic.   I would also like to check her lipids- her LDL was 17 in Aug 2016 when her Crestor was cut back to 20 mg daily secondary to myalgias. She should f/u with Jory Sims in two weeks for her B/P and Lipids, her Crestor may need to be cut back further.  If her  B/P is stable I would suggest she take her B/P 3 x week.  Kerin [name] K PA-C 03/07/2015 1:49 PM

## 2015-03-07 NOTE — Patient Instructions (Signed)
Your physician recommends that you schedule a follow-up appointment in: 2 weeks with Jory Sims, NP.   Your physician recommends that you return for lab work in: Fasting  Your physician has recommended you make the following change in your medication:   Norvasc 5 mg Daily Hydralazine 10 mg As Needed for Systolic BP above 0000000  Metoprolol 12.5 Daily   Please Check Blood Pressure Daily for 5 Days if Systolic ( Top number) is below 150. You may decrease to 3 times weekly.   Thank you for choosing [facility]!

## 2015-03-07 NOTE — Assessment & Plan Note (Signed)
Followed by PCP

## 2015-03-07 NOTE — Telephone Encounter (Signed)
She has an appointment on 12/21 @ 1 to see Kerin [name], [place].

## 2015-03-07 NOTE — Assessment & Plan Note (Signed)
In Aug 2016 her cholesterol was 80, with an LDL of 17! Her Crestor was decreased then from 40 mg to 20 mg.

## 2015-03-07 NOTE — Assessment & Plan Note (Addendum)
CABG x 18 August 2014, preserved LVF at cath

## 2015-03-08 ENCOUNTER — Ambulatory Visit (INDEPENDENT_AMBULATORY_CARE_PROVIDER_SITE_OTHER): Payer: Medicare HMO | Admitting: Family Medicine

## 2015-03-08 ENCOUNTER — Encounter: Payer: Self-pay | Admitting: Family Medicine

## 2015-03-08 VITALS — BP 150/72 | HR 62 | Temp 97.8°F | Resp 14 | Ht 63.0 in | Wt 135.0 lb

## 2015-03-08 DIAGNOSIS — R300 Dysuria: Secondary | ICD-10-CM | POA: Diagnosis not present

## 2015-03-08 LAB — COMPREHENSIVE METABOLIC PANEL
ALT: 25 U/L (ref 6–29)
AST: 19 U/L (ref 10–35)
Albumin: 4.2 g/dL (ref 3.6–5.1)
Alkaline Phosphatase: 38 U/L (ref 33–130)
BUN: 15 mg/dL (ref 7–25)
CO2: 25 mmol/L (ref 20–31)
Calcium: 9.6 mg/dL (ref 8.6–10.4)
Chloride: 97 mmol/L — ABNORMAL LOW (ref 98–110)
Creat: 0.76 mg/dL (ref 0.60–0.88)
Glucose, Bld: 141 mg/dL — ABNORMAL HIGH (ref 65–99)
Potassium: 4.2 mmol/L (ref 3.5–5.3)
Sodium: 133 mmol/L — ABNORMAL LOW (ref 135–146)
Total Bilirubin: 0.6 mg/dL (ref 0.2–1.2)
Total Protein: 6.7 g/dL (ref 6.1–8.1)

## 2015-03-08 LAB — URINALYSIS, ROUTINE W REFLEX MICROSCOPIC
BILIRUBIN URINE: NEGATIVE
Glucose, UA: NEGATIVE
Ketones, ur: NEGATIVE
Leukocytes, UA: NEGATIVE
Nitrite: NEGATIVE
PROTEIN: NEGATIVE
Specific Gravity, Urine: 1.015 (ref 1.001–1.035)
pH: 6 (ref 5.0–8.0)

## 2015-03-08 LAB — URINALYSIS, MICROSCOPIC ONLY
Casts: NONE SEEN [LPF]
Crystals: NONE SEEN [HPF]
WBC UA: NONE SEEN WBC/HPF (ref <=5)
YEAST: NONE SEEN [HPF]

## 2015-03-08 LAB — LIPID PANEL
Cholesterol: 94 mg/dL — ABNORMAL LOW (ref 125–200)
HDL: 69 mg/dL (ref >=46)
LDL Cholesterol: 15 mg/dL (ref <130)
Total CHOL/HDL Ratio: 1.4 Ratio (ref <=5.0)
Triglycerides: 51 mg/dL (ref <150)
VLDL: 10 mg/dL (ref <30)

## 2015-03-08 MED ORDER — FLUCONAZOLE 150 MG PO TABS: 150.0000 mg | ORAL_TABLET | Freq: Once | ORAL | Status: DC

## 2015-03-08 MED ORDER — CIPROFLOXACIN HCL 500 MG PO TABS: 500.0000 mg | ORAL_TABLET | Freq: Two times a day (BID) | ORAL | Status: DC

## 2015-03-08 NOTE — Progress Notes (Signed)
Subjective:    Patient ID: Misty Edwards, female    DOB: 09/13/1926, 81 y.o.   MRN: [id number]  HPI Patient presents with one-week of dysuria. She also reports some pelvic discomfort that improves after she urinates. She reports polyuria and foul-smelling urine. She's been trying to drink more water recently to treat that but it has not improved. She is afebrile. She denies any CVA tenderness. Her urinalysis today is relatively benign with just trace blood. However her symptoms are consistent with a bladder infection. Her blood pressure today is elevated at 150/72. However she has not been taking her amlodipine regularly, just as needed? Past Medical History  Diagnosis Date  . Hypertension   . History of carcinoma in situ of breast 1988  . Acute biliary pancreatitis 07/2002    thia was in 07/2004:she still has pseudocyst in tail of pancreas measuring 54 x 33 mm   . Upper GI bleed September2004    secondary to gastritis  . Diabetes mellitus ([place])   . Adrenal adenoma     bilateral  . Detached retina   . Chronic pancreatitis ([place])   . Hyperlipidemia   . Osteopenia   . IBS (irritable bowel syndrome)   . Legally blind   . Anxiety   . Osteoarthritis   . Diverticulosis    Past Surgical History  Procedure Laterality Date  . Colonoscopy  08/15/05    few tiny diverticula at sigmoid colon/external hemorrhoids but no polyps  . Right mastectomy    . Ectopic pregnancy surgery  1950's  . Tacking up of her bladder    . Laparoscopic cholecystectomy    . Ercp with sphincterotomy  07/2002  . Pancreatic pseudocyst drainage  June2004    drained percutaneously   . Esophagogastroduodenoscopy       Gastritis of body.  Otherwise normal  . Colonoscopy  01/08/2012    [id number] diverticulosis. Next colonoscopy in 12/2016  . Esophagogastroduodenoscopy  01/08/2012    RMR: Few scattered gastric erosions of uncertain significance-status post biopsy. Minimal chronic inflammation, no H.pylori  . Cardiac  catheterization N/A 08/24/2014    Procedure: Left Heart Cath and Coronary Angiography;  Surgeon: Leonie Man, MD;  Location: [facility];  Service: Cardiovascular;  Laterality: N/A;  . Coronary artery bypass graft N/A 08/24/2014    Procedure: CORONARY ARTERY BYPASS GRAFTING (CABG) x three, using left internal mammary artery and right leg greater saphenous vein harvested endoscopically;  Surgeon: Ivin Poot, MD;  Location: [facility];  Service: Open Heart Surgery;  Laterality: N/A;  . Tee without cardioversion N/A 08/24/2014    Procedure: TRANSESOPHAGEAL ECHOCARDIOGRAM (TEE);  Surgeon: Ivin Poot, MD;  Location: [facility];  Service: Open Heart Surgery;  Laterality: N/A;   Current Outpatient Prescriptions on File Prior to Visit  Medication Sig Dispense Refill  . acetaminophen (TYLENOL) 500 MG tablet Take 500-1,000 mg by mouth 2 (two) times daily as needed. Pain.    Marland Kitchen amLODipine (NORVASC) 5 MG tablet Take 1 tablet (5 mg total) by mouth daily. 90 tablet 3  . aspirin EC 81 MG EC tablet Take 1 tablet (81 mg total) by mouth daily.    . cetirizine (ZYRTEC) 10 MG tablet TAKE ONE TABLET BY MOUTH ONCE DAILY 90 tablet 3  . CREON 24000 UNITS CPEP 2 w/ meals, 1 w/ snacks (Patient taking differently: Take 2 capsules by mouth 3 (three) times daily with meals. ) 720 capsule 3  . cycloSPORINE (RESTASIS) 0.05 % ophthalmic emulsion Place  1 drop into both eyes 3 (three) times daily.     Marland Kitchen docusate sodium (COLACE) 100 MG capsule Take 1 capsule (100 mg total) by mouth 2 (two) times daily as needed for mild constipation. 60 capsule 5  . enalapril (VASOTEC) 20 MG tablet Take 1 tablet (20 mg total) by mouth daily. 90 tablet 3  . feeding supplement, ENSURE ENLIVE, (ENSURE ENLIVE) LIQD Take 237 mLs by mouth 2 (two) times daily between meals. 237 mL 12  . FLUoxetine (PROZAC) 20 MG tablet Take 1 tablet (20 mg total) by mouth daily. 90 tablet 3  . FLUoxetine (PROZAC) 20 MG tablet Take 1 tablet (20 mg total) by mouth  daily. 90 tablet 3  . furosemide (LASIX) 20 MG tablet Take 1 tablet (20 mg total) by mouth daily as needed. 30 tablet 5  . insulin NPH Human (HUMULIN N,NOVOLIN N) 100 UNIT/ML injection Inject into the skin. 30 units qam - 4 units at night    . Insulin Syringes, Disposable, U-100 1 ML MISC Pt uses 8 mm 31 g 300 each 3  . LORazepam (ATIVAN) 0.5 MG tablet TAKE ONE TABLET BY MOUTH EVERY 8 HOURS AS NEEDED FOR ANXIETY/AGITATION 30 tablet 2  . metFORMIN (GLUCOPHAGE-XR) 500 MG 24 hr tablet Take 500 mg by mouth daily with breakfast.    . metoprolol tartrate (LOPRESSOR) 25 MG tablet Take 25 mg by mouth 2 (two) times daily.    . Multiple Vitamin (MULTIVITAMIN) capsule Take 1 capsule by mouth daily.    Marland Kitchen omeprazole (PRILOSEC) 40 MG capsule Take 1 capsule (40 mg total) by mouth daily. 30 capsule 11  . oxyCODONE (OXY IR/ROXICODONE) 5 MG immediate release tablet Take one half tablet by mouth BID prn severe pain 30 tablet 0  . potassium chloride (K-DUR,KLOR-CON) 10 MEQ tablet Take 1 tablet (10 mEq total) by mouth daily. 30 tablet 5  . promethazine (PHENERGAN) 12.5 MG tablet Take 0.5-1 tablets (6.25-12.5 mg total) by mouth every 6 (six) hours as needed for nausea or vomiting. 30 tablet 0  . rosuvastatin (CRESTOR) 20 MG tablet Take 1 tablet (20 mg total) by mouth daily. 90 tablet 3  . zolpidem (AMBIEN) 10 MG tablet Take 1 tablet (10 mg total) by mouth at bedtime as needed. for insomnia 30 tablet 0  . hydrALAZINE (APRESOLINE) 10 MG tablet Two Times Daily As Needed for Systolic BP above 0000000 (Patient not taking: Reported on 03/08/2015) 60 tablet 3   No current facility-administered medications on file prior to visit.   Allergies  Allergen Reactions  . Calcium-Containing Compounds Nausea Only  . Raloxifene Itching    Face and eyes burning  . Vitamin D Analogs Nausea Only   Social History   Social History  . Marital Status: Married    Spouse Name: N/A  . Number of Children: 5  . Years of Education: N/A    Occupational History  . homemaker    Social History Main Topics  . Smoking status: Never Smoker   . Smokeless tobacco: Not on file  . Alcohol Use: No  . Drug Use: No  . Sexual Activity: No   Other Topics Concern  . Not on file   Social History Narrative   Lives in [place].      Review of Systems  All other systems reviewed and are negative.      Objective:   Physical Exam  Constitutional: She appears well-developed and well-nourished.  Cardiovascular: Normal rate, regular rhythm and normal heart sounds.   Pulmonary/Chest:  Effort normal and breath sounds normal.  Abdominal: Soft. Bowel sounds are normal. She exhibits no distension. There is no tenderness. There is no rebound.  Musculoskeletal: She exhibits no edema.  Vitals reviewed.         Assessment & Plan:  Burning with urination - Plan: Urinalysis, Routine w reflex microscopic (not at [facility]), ciprofloxacin (CIPRO) 500 MG tablet  Symptoms are classic for a bladder infection. Even though the urinalysis is relatively benign today, I will treat the patient's symptoms with Cipro 500 mg by mouth twice a day for 3 days. Recheck next week if no better or sooner if worse. I encouraged the patient to take amlodipine every day to treat her blood pressure. Recheck blood pressure in 2 weeks.

## 2015-03-08 NOTE — Telephone Encounter (Signed)
Addressed by appt 03/07/2015

## 2015-03-09 ENCOUNTER — Other Ambulatory Visit: Payer: Self-pay | Admitting: Family Medicine

## 2015-03-09 ENCOUNTER — Telehealth: Payer: Self-pay | Admitting: *Deleted

## 2015-03-09 DIAGNOSIS — Z951 Presence of aortocoronary bypass graft: Secondary | ICD-10-CM

## 2015-03-09 MED ORDER — METOPROLOL TARTRATE 25 MG PO TABS: 25.0000 mg | ORAL_TABLET | Freq: Two times a day (BID) | ORAL | Status: DC

## 2015-03-09 MED ORDER — ROSUVASTATIN CALCIUM 5 MG PO TABS: 5.0000 mg | ORAL_TABLET | Freq: Every day | ORAL | Status: DC

## 2015-03-09 NOTE — Telephone Encounter (Signed)
-----   Message from Erlene Quan, [place] sent at 03/09/2015  9:18 AM EST ----- Stop Crestor 20 mg. Start Crestor 5 mg MWF. Check Lipids and LFTs in 6 months.  Kerin [name] PA-C 03/09/2015 9:18 AM

## 2015-03-09 NOTE — Telephone Encounter (Signed)
Spoke with pt, she voiced understanding of medication change. Lab orders mailed to the pt.

## 2015-03-13 ENCOUNTER — Other Ambulatory Visit: Payer: Self-pay | Admitting: *Deleted

## 2015-03-13 MED ORDER — METOPROLOL TARTRATE 25 MG PO TABS: 25.0000 mg | ORAL_TABLET | Freq: Two times a day (BID) | ORAL | Status: DC

## 2015-03-13 NOTE — Telephone Encounter (Signed)
Received fax requesting refill on metoprolol.  Refill appropriate and filled per protocol.

## 2015-03-20 ENCOUNTER — Telehealth: Payer: Self-pay | Admitting: Cardiology

## 2015-03-20 NOTE — Telephone Encounter (Signed)
[name] called and left a message stating her mother is having swelling in her feet and would like to know what she needs to do.

## 2015-03-21 DIAGNOSIS — Z6823 Body mass index (BMI) 23.0-23.9, adult: Secondary | ICD-10-CM | POA: Diagnosis not present

## 2015-03-21 DIAGNOSIS — N898 Other specified noninflammatory disorders of vagina: Secondary | ICD-10-CM | POA: Diagnosis not present

## 2015-03-21 DIAGNOSIS — Z01419 Encounter for gynecological examination (general) (routine) without abnormal findings: Secondary | ICD-10-CM | POA: Diagnosis not present

## 2015-03-21 NOTE — Telephone Encounter (Signed)
Spoke with daughter. Daughter denies SOB at this time. States she gave her a PRN Lasix yesterday 03/20/15 . Daughter reports swelling is only to top feet. Daughter has not been recording daily weights but will start today.

## 2015-03-21 NOTE — Telephone Encounter (Signed)
She should take her prn lasix over the next 2-3 days and see how the swelling responds and call us back Friday.   Zandra Abts MD

## 2015-03-21 NOTE — Telephone Encounter (Signed)
Spoke with daughter [name]. All instructions explained. [name] voiced understanding.

## 2015-03-26 NOTE — Telephone Encounter (Signed)
Pt daughter called to let us know about her mother's weights. Thur.- AM was 134.5 ( took fluid pill) PM was 134.5, Fri.- AM 132.5 (took fluid pill) 133.5 PM, Sat.- 132 AM (fluid pill) 134 PM, Sun.- 132 AM  (no fluid pill) 134 PM, Mon.- 132 AM (no fluid pill) . She was calling in as an [place]

## 2015-03-27 ENCOUNTER — Ambulatory Visit: Payer: Medicare HMO | Admitting: Adult Health

## 2015-04-02 ENCOUNTER — Telehealth: Payer: Self-pay | Admitting: *Deleted

## 2015-04-02 DIAGNOSIS — Z1231 Encounter for screening mammogram for malignant neoplasm of breast: Secondary | ICD-10-CM | POA: Diagnosis not present

## 2015-04-02 DIAGNOSIS — Z9011 Acquired absence of right breast and nipple: Secondary | ICD-10-CM | POA: Diagnosis not present

## 2015-04-02 NOTE — Telephone Encounter (Signed)
Received request from pharmacy for PA on One Touch Test Strips- test FSBS 5x daily.   PA submitted.   Dx: E11.65.

## 2015-04-06 DIAGNOSIS — R69 Illness, unspecified: Secondary | ICD-10-CM | POA: Diagnosis not present

## 2015-04-09 MED ORDER — GLUCOSE BLOOD VI STRP: ORAL_STRIP | Status: DC

## 2015-04-09 NOTE — Telephone Encounter (Signed)
Received determination.   [employer] advised request is duplicate. Approved by Medicare part B until 03/16/2016.  Pharmacy made aware.

## 2015-05-07 ENCOUNTER — Ambulatory Visit: Payer: Medicare HMO | Admitting: Family Medicine

## 2015-05-24 ENCOUNTER — Telehealth: Payer: Self-pay | Admitting: Family Medicine

## 2015-05-24 NOTE — Telephone Encounter (Signed)
Misty Edwards calling to discuss Misty Edwards's lorazepam Would like to discuss quantity   [phone]

## 2015-05-28 NOTE — Telephone Encounter (Signed)
Ok with ativan 0.5 (1-2) poqhs (60)

## 2015-05-28 NOTE — Telephone Encounter (Signed)
Spoke to pt's daughter and she states that your wanted her to stop taking Ambien and to give her the ativan at night when needed. She has been doing that with good results - she has been giving her 2 - .5mg  at night but she is running out quicker and would like to either have the mg's increase or qty.

## 2015-05-28 NOTE — Telephone Encounter (Signed)
Tried to call no answer and no vm 

## 2015-05-30 MED ORDER — LORAZEPAM 0.5 MG PO TABS: ORAL_TABLET | ORAL | Status: DC

## 2015-05-30 NOTE — Telephone Encounter (Signed)
Medication called/sent to requested pharmacy and debra aware

## 2015-06-01 ENCOUNTER — Other Ambulatory Visit: Payer: Self-pay | Admitting: Family Medicine

## 2015-06-01 NOTE — Telephone Encounter (Signed)
Medication refilled per protocol. 

## 2015-06-01 NOTE — Telephone Encounter (Signed)
Ok

## 2015-06-01 NOTE — Telephone Encounter (Signed)
Last OV note I see addressing insuline has her on 20 units in AM and 10 units with supper.  Is that correct?  Please verify before I send refill!

## 2015-06-04 ENCOUNTER — Telehealth: Payer: Self-pay | Admitting: Cardiology

## 2015-06-04 ENCOUNTER — Other Ambulatory Visit: Payer: Self-pay | Admitting: Family Medicine

## 2015-06-04 MED ORDER — INSULIN NPH (HUMAN) (ISOPHANE) 100 UNIT/ML ~~LOC~~ SUSP: SUBCUTANEOUS | Status: DC

## 2015-06-04 NOTE — Telephone Encounter (Signed)
Per notes from [facility] Done of Dec 2016,labs due in June 2017

## 2015-06-04 NOTE — Telephone Encounter (Signed)
[name] is wanting to know if she is going to need lab work done prior to her April 7th apt

## 2015-06-22 ENCOUNTER — Encounter: Payer: Self-pay | Admitting: Cardiology

## 2015-06-22 ENCOUNTER — Telehealth: Payer: Self-pay | Admitting: Family Medicine

## 2015-06-22 ENCOUNTER — Ambulatory Visit (INDEPENDENT_AMBULATORY_CARE_PROVIDER_SITE_OTHER): Payer: Medicare HMO | Admitting: Cardiology

## 2015-06-22 VITALS — BP 165/66 | HR 62 | Ht 63.0 in | Wt 137.0 lb

## 2015-06-22 DIAGNOSIS — I10 Essential (primary) hypertension: Secondary | ICD-10-CM

## 2015-06-22 DIAGNOSIS — E785 Hyperlipidemia, unspecified: Secondary | ICD-10-CM

## 2015-06-22 DIAGNOSIS — I2510 Atherosclerotic heart disease of native coronary artery without angina pectoris: Secondary | ICD-10-CM | POA: Diagnosis not present

## 2015-06-22 MED ORDER — INSULIN SYRINGES (DISPOSABLE) U-100 1 ML MISC: Status: DC

## 2015-06-22 NOTE — Telephone Encounter (Signed)
[name] is calling to have 1/2" needle syringes called in to [facility]. She says that the needles that we called in last time were too large.

## 2015-06-22 NOTE — Patient Instructions (Signed)
Your physician wants you to follow-up in: 6 months with Dr Bryna Colander will receive a reminder letter in the mail two months in advance. If you don't receive a letter, please call our office to schedule the follow-up appointment.    Your physician recommends that you continue on your current medications as directed. Please refer to the Current Medication list given to you today.    Your physician has requested that you have an echocardiogram. Echocardiography is a painless test that uses sound waves to create images of your heart. It provides your doctor with information about the size and shape of your heart and how well your heart's chambers and valves are working. This procedure takes approximately one hour. There are no restrictions for this procedure.     Get FASTING Lipid profile      Thank you for choosing [facility] !

## 2015-06-22 NOTE — Progress Notes (Signed)
Patient ID: Misty Edwards, female   DOB: 02-26-1927, 82 y.o.   MRN: [id number]     Clinical Summary Misty Edwards is a 82 y.o.female seen today for follow up, she was last seen by Misty Edwards, this is our first visit together.  1. CAD - history of emergent CABG June 2016 after presenting with NSTEMI - beta blocker dosing limited due to bradycardia.  -LVEF normal by LV gram, no echo is system, presume due to the fact she was taken for emergent CABG.   - denies any chest pain. No SOB or DOE. She completed cardiac rehab - compliant with meds  2. HTN - checks at home, typically around 110s-130s/60s.  - norvasc decreased to 5mg  daily, there was some question if the 10mg  dosing was contributing to LE edema.   3. Hyperlipidemia - 02/2015 TC 94 TG 51 HDL 69 LDL 15 - at the time of last panel crestor was decreased to 5mg  daily.     Past Medical History  Diagnosis Date  . Hypertension   . History of carcinoma in situ of breast 1988  . Acute biliary pancreatitis 07/2002    thia was in 07/2004:she still has pseudocyst in tail of pancreas measuring 54 x 33 mm   . Upper GI bleed September2004    secondary to gastritis  . Diabetes mellitus ([place])   . Adrenal adenoma     bilateral  . Detached retina   . Chronic pancreatitis ([place])   . Hyperlipidemia   . Osteopenia   . IBS (irritable bowel syndrome)   . Legally blind   . Anxiety   . Osteoarthritis   . Diverticulosis      Allergies  Allergen Reactions  . Calcium-Containing Compounds Nausea Only  . Raloxifene Itching    Face and eyes burning  . Vitamin D Analogs Nausea Only     Current Outpatient Prescriptions  Medication Sig Dispense Refill  . acetaminophen (TYLENOL) 500 MG tablet Take 500-1,000 mg by mouth 2 (two) times daily as needed. Pain.    Marland Kitchen amLODipine (NORVASC) 5 MG tablet Take 1 tablet (5 mg total) by mouth daily. 90 tablet 3  . aspirin EC 81 MG EC tablet Take 1 tablet (81 mg total) by mouth daily.    . cetirizine (ZYRTEC)  10 MG tablet TAKE ONE TABLET BY MOUTH ONCE DAILY 90 tablet 3  . ciprofloxacin (CIPRO) 500 MG tablet Take 1 tablet (500 mg total) by mouth 2 (two) times daily. 6 tablet 0  . CREON 24000 UNITS CPEP 2 w/ meals, 1 w/ snacks (Patient taking differently: Take 2 capsules by mouth 3 (three) times daily with meals. ) 720 capsule 3  . cycloSPORINE (RESTASIS) 0.05 % ophthalmic emulsion Place 1 drop into both eyes 3 (three) times daily.     Marland Kitchen docusate sodium (COLACE) 100 MG capsule Take 1 capsule (100 mg total) by mouth 2 (two) times daily as needed for mild constipation. 60 capsule 5  . enalapril (VASOTEC) 20 MG tablet Take 1 tablet (20 mg total) by mouth daily. 90 tablet 3  . feeding supplement, ENSURE ENLIVE, (ENSURE ENLIVE) LIQD Take 237 mLs by mouth 2 (two) times daily between meals. 237 mL 12  . fluconazole (DIFLUCAN) 150 MG tablet Take 1 tablet (150 mg total) by mouth once. 1 tablet 0  . FLUoxetine (PROZAC) 20 MG tablet Take 1 tablet (20 mg total) by mouth daily. 90 tablet 3  . FLUoxetine (PROZAC) 20 MG tablet Take 1 tablet (20 mg total)  by mouth daily. 90 tablet 3  . furosemide (LASIX) 20 MG tablet Take 1 tablet (20 mg total) by mouth daily as needed. 30 tablet 5  . glucose blood test strip Use as instructed to monitor FSBS 5x daily d/t fluctuating blood sugars. Dx: E11.65 200 each 12  . hydrALAZINE (APRESOLINE) 10 MG tablet Two Times Daily As Needed for Systolic BP above 0000000 (Patient not taking: Reported on 03/08/2015) 60 tablet 3  . insulin NPH Human (HUMULIN N,NOVOLIN N) 100 UNIT/ML injection Inject into the skin. 30 units qam - 4 units at night    . insulin NPH Human (NOVOLIN N RELION) 100 UNIT/ML injection Inject 20 units in the AM, 10 units with supper 40 mL 3  . Insulin Syringes, Disposable, U-100 1 ML MISC Pt uses 8 mm 31 g 300 each 3  . LORazepam (ATIVAN) 0.5 MG tablet 1-2 tab po qhs prn 60 tablet 2  . metFORMIN (GLUCOPHAGE-XR) 500 MG 24 hr tablet Take 500 mg by mouth daily with breakfast.      . metoprolol tartrate (LOPRESSOR) 25 MG tablet Take 1 tablet (25 mg total) by mouth 2 (two) times daily. 180 tablet 3  . Multiple Vitamin (MULTIVITAMIN) capsule Take 1 capsule by mouth daily.    Marland Kitchen omeprazole (PRILOSEC) 40 MG capsule Take 1 capsule (40 mg total) by mouth daily. 30 capsule 11  . oxyCODONE (OXY IR/ROXICODONE) 5 MG immediate release tablet Take one half tablet by mouth BID prn severe pain 30 tablet 0  . potassium chloride (K-DUR,KLOR-CON) 10 MEQ tablet Take 1 tablet (10 mEq total) by mouth daily. 30 tablet 5  . promethazine (PHENERGAN) 12.5 MG tablet Take 0.5-1 tablets (6.25-12.5 mg total) by mouth every 6 (six) hours as needed for nausea or vomiting. 30 tablet 0  . rosuvastatin (CRESTOR) 5 MG tablet Take 1 tablet (5 mg total) by mouth daily. 90 tablet 3  . zolpidem (AMBIEN) 10 MG tablet Take 1 tablet (10 mg total) by mouth at bedtime as needed. for insomnia 30 tablet 0   No current facility-administered medications for this visit.     Past Surgical History  Procedure Laterality Date  . Colonoscopy  08/15/05    few tiny diverticula at sigmoid colon/external hemorrhoids but no polyps  . Right mastectomy    . Ectopic pregnancy surgery  1950's  . Tacking up of her bladder    . Laparoscopic cholecystectomy    . Ercp with sphincterotomy  07/2002  . Pancreatic pseudocyst drainage  June2004    drained percutaneously   . Esophagogastroduodenoscopy       Gastritis of body.  Otherwise normal  . Colonoscopy  01/08/2012    [id number] diverticulosis. Next colonoscopy in 12/2016  . Esophagogastroduodenoscopy  01/08/2012    RMR: Few scattered gastric erosions of uncertain significance-status post biopsy. Minimal chronic inflammation, no H.pylori  . Cardiac catheterization N/A 08/24/2014    Procedure: Left Heart Cath and Coronary Angiography;  Surgeon: Misty Man, MD;  Location: [facility];  Service: Cardiovascular;  Laterality: N/A;  . Coronary artery bypass graft N/A  08/24/2014    Procedure: CORONARY ARTERY BYPASS GRAFTING (CABG) x three, using left internal mammary artery and right leg greater saphenous vein harvested endoscopically;  Surgeon: Ivin Poot, MD;  Location: [facility];  Service: Open Heart Surgery;  Laterality: N/A;  . Tee without cardioversion N/A 08/24/2014    Procedure: TRANSESOPHAGEAL ECHOCARDIOGRAM (TEE);  Surgeon: Ivin Poot, MD;  Location: [facility];  Service: Open Heart  Surgery;  Laterality: N/A;     Allergies  Allergen Reactions  . Calcium-Containing Compounds Nausea Only  . Raloxifene Itching    Face and eyes burning  . Vitamin D Analogs Nausea Only      Family History  Problem Relation Age of Onset  . Colon cancer Father 92    passed away in his 24's  . Colon cancer Brother 61    surgery inhis 50's doing well now  . Heart attack Brother     passed away age 62  . Pancreatitis Brother   . Ovarian cancer Mother     passed  . Kidney disease Daughter   . Leukemia Son      Social History Ms. Benedicto reports that she has never smoked. She does not have any smokeless tobacco history on file. Ms. Winrow reports that she does not drink alcohol.   Review of Systems CONSTITUTIONAL: No weight loss, fever, chills, weakness or fatigue.  HEENT: Eyes: No visual loss, blurred vision, double vision or yellow sclerae.No hearing loss, sneezing, congestion, runny nose or sore throat.  SKIN: No rash or itching.  CARDIOVASCULAR: per HPI RESPIRATORY: No shortness of breath, cough or sputum.  GASTROINTESTINAL: No anorexia, nausea, vomiting or diarrhea. No abdominal pain or blood.  GENITOURINARY: No burning on urination, no polyuria NEUROLOGICAL: No headache, dizziness, syncope, paralysis, ataxia, numbness or tingling in the extremities. No change in bowel or bladder control.  MUSCULOSKELETAL: No muscle, back pain, joint pain or stiffness.  LYMPHATICS: No enlarged nodes. No history of splenectomy.  PSYCHIATRIC: No history of depression or  anxiety.  ENDOCRINOLOGIC: No reports of sweating, cold or heat intolerance. No polyuria or polydipsia.  Marland Kitchen   Physical Examination Filed Vitals:   06/22/15 1033  BP: 165/66  Pulse: 62   Filed Vitals:   06/22/15 1033  Height: 5\' 3"  (1.6 m)  Weight: 137 lb (62.143 kg)    Gen: resting comfortably, no acute distress HEENT: no scleral icterus, pupils equal round and reactive, no palptable cervical adenopathy,  CV: RRR, no m/r/g, no jvd Resp: Clear to auscultation bilaterally GI: abdomen is soft, non-tender, non-distended, normal bowel sounds, no hepatosplenomegaly MSK: extremities are warm, no edema.  Skin: warm, no rash Neuro:  no focal deficits Psych: appropriate affect   Diagnostic Studies 08/2014 cath  Severe multivessel CAD including left main and mid right.  Distal LM lesion, 95% stenosed.  Ost Cx lesion, 95% stenosed.  Ost LAD to Mid LAD lesion, 75% stenosed. Mid LAD lesion, 100% stenosed. After 2 septal perforators. Also noted is a proximal Ost 1st Diag to 1st Diag lesion, 100% stenosed.  Prox RCA to Mid RCA lesion, diffuse 70% stenosed with focal Mid RCA lesion, 95% stenosed.  Relatively well-preserved LV function with mild mid inferior hypokinesis and mild MR. Only minimally elevated LVEDP     Assessment and Plan  1. CAD - s/p CABG in June 2016 - no current symptoms - obtain echo, does not appear she had one at the time of her CABG due its emergency indication, need to know function in setting of known CAD   2. HTN - elevated in clinic but at goal by home numbers - we will continue current meds  3. Hyperlipidemia - fairly low LDL on last check, her crestor was decreased at that time. We will repeat lipid panel   F/u 6 months      Arnoldo Lenis, M.D.

## 2015-06-22 NOTE — Telephone Encounter (Signed)
Syringes with 1/2 inch needles called/sent to requested pharmacy

## 2015-07-09 ENCOUNTER — Ambulatory Visit (HOSPITAL_COMMUNITY): Payer: Medicare HMO

## 2015-07-09 DIAGNOSIS — R69 Illness, unspecified: Secondary | ICD-10-CM | POA: Diagnosis not present

## 2015-07-12 ENCOUNTER — Ambulatory Visit (HOSPITAL_COMMUNITY)
Admission: RE | Admit: 2015-07-12 | Discharge: 2015-07-12 | Disposition: A | Discharge status: Home / Self Care | Payer: Medicare HMO | Source: Ambulatory Visit | Attending: Cardiology | Admitting: Cardiology

## 2015-07-12 DIAGNOSIS — I2510 Atherosclerotic heart disease of native coronary artery without angina pectoris: Secondary | ICD-10-CM | POA: Insufficient documentation

## 2015-07-12 DIAGNOSIS — K219 Gastro-esophageal reflux disease without esophagitis: Secondary | ICD-10-CM | POA: Insufficient documentation

## 2015-07-12 DIAGNOSIS — E119 Type 2 diabetes mellitus without complications: Secondary | ICD-10-CM | POA: Diagnosis not present

## 2015-07-12 DIAGNOSIS — I358 Other nonrheumatic aortic valve disorders: Secondary | ICD-10-CM | POA: Insufficient documentation

## 2015-07-12 DIAGNOSIS — E785 Hyperlipidemia, unspecified: Secondary | ICD-10-CM | POA: Insufficient documentation

## 2015-07-12 DIAGNOSIS — I340 Nonrheumatic mitral (valve) insufficiency: Secondary | ICD-10-CM | POA: Insufficient documentation

## 2015-07-12 DIAGNOSIS — Z951 Presence of aortocoronary bypass graft: Secondary | ICD-10-CM | POA: Diagnosis not present

## 2015-07-12 DIAGNOSIS — I371 Nonrheumatic pulmonary valve insufficiency: Secondary | ICD-10-CM | POA: Insufficient documentation

## 2015-07-30 DIAGNOSIS — E785 Hyperlipidemia, unspecified: Secondary | ICD-10-CM | POA: Diagnosis not present

## 2015-07-31 LAB — LIPID PANEL
CHOL/HDL RATIO: 1.7 ratio (ref <=5.0)
Cholesterol: 108 mg/dL — ABNORMAL LOW (ref 125–200)
HDL: 63 mg/dL (ref >=46)
LDL CALC: 30 mg/dL (ref <130)
Triglycerides: 76 mg/dL (ref <150)
VLDL: 15 mg/dL (ref <30)

## 2015-08-02 ENCOUNTER — Telehealth: Payer: Self-pay | Admitting: *Deleted

## 2015-08-02 NOTE — Telephone Encounter (Signed)
-----   Message from Massie Maroon, [place] sent at 08/02/2015  7:42 AM EDT -----   ----- Message -----    From: Arnoldo Lenis, MD    Sent: 08/01/2015   1:39 PM      To: Massie Maroon, CMA  Labs look good  Zandra Abts MD

## 2015-08-02 NOTE — Telephone Encounter (Signed)
Called patient with test results. No answer. Left message to call back.  

## 2015-08-03 ENCOUNTER — Telehealth: Payer: Self-pay

## 2015-08-03 NOTE — Telephone Encounter (Signed)
PT MADE AWARE OF TEST RESULTS. VOICED UNDERSTANDING

## 2015-08-28 ENCOUNTER — Other Ambulatory Visit: Payer: Self-pay | Admitting: Family Medicine

## 2015-08-31 ENCOUNTER — Other Ambulatory Visit: Payer: Self-pay | Admitting: Family Medicine

## 2015-08-31 MED ORDER — OMEPRAZOLE 40 MG PO CPDR: 40.0000 mg | DELAYED_RELEASE_CAPSULE | Freq: Every day | ORAL | Status: DC

## 2015-08-31 NOTE — Telephone Encounter (Signed)
Medication refilled per protocol. 

## 2015-09-26 ENCOUNTER — Other Ambulatory Visit: Payer: Self-pay | Admitting: Family Medicine

## 2015-09-26 MED ORDER — INSULIN NPH (HUMAN) (ISOPHANE) 100 UNIT/ML ~~LOC~~ SUSP: SUBCUTANEOUS | Status: DC

## 2015-09-26 NOTE — Telephone Encounter (Signed)
Requesting 3 vial at a time from pharm - rx sent

## 2015-10-05 ENCOUNTER — Ambulatory Visit: Payer: Medicare HMO | Admitting: Gastroenterology

## 2015-10-11 ENCOUNTER — Other Ambulatory Visit: Payer: Self-pay | Admitting: Family Medicine

## 2015-10-12 NOTE — Telephone Encounter (Signed)
Refill appropriate and filled per protocol. 

## 2015-10-15 ENCOUNTER — Other Ambulatory Visit: Payer: Self-pay | Admitting: Family Medicine

## 2015-10-15 DIAGNOSIS — R69 Illness, unspecified: Secondary | ICD-10-CM | POA: Diagnosis not present

## 2015-10-23 ENCOUNTER — Other Ambulatory Visit: Payer: Self-pay | Admitting: Family Medicine

## 2015-10-23 NOTE — Telephone Encounter (Signed)
Ok to refill??  Last office visit 03/08/2015.  Last refill 05/30/2015, #2 refills.

## 2015-10-23 NOTE — Telephone Encounter (Signed)
Ntbs, ok with temp refill. 

## 2015-10-24 NOTE — Telephone Encounter (Signed)
Medication refill for one time only.  Patient needs to be seen.  Letter sent for patient to call and schedule 

## 2015-10-25 ENCOUNTER — Encounter (HOSPITAL_COMMUNITY): Payer: Self-pay

## 2015-11-07 ENCOUNTER — Ambulatory Visit: Payer: Medicare HMO | Admitting: Gastroenterology

## 2015-11-09 ENCOUNTER — Other Ambulatory Visit: Payer: Self-pay | Admitting: Family Medicine

## 2015-11-09 NOTE — Telephone Encounter (Signed)
Refill appropriate and filled per protocol. 

## 2015-11-15 ENCOUNTER — Telehealth: Payer: Self-pay | Admitting: *Deleted

## 2015-11-15 NOTE — Telephone Encounter (Signed)
Received call from patient daughter, [name].   States that insulin syringes remain too long and are causing increased bruising to patient abdomen. Current order is for 1/2" 31 gauge needle.   Call placed to pharmacy. Advised that patient did receive correct gauge syringe.   States that only smaller gauge is 1/4" 33 gauge (23mm).   Patient can bring back 1/2" 31 gauge and trade.   Call placed to patient daughter.   [place].

## 2015-11-16 NOTE — Telephone Encounter (Signed)
Patient daughter returned call.   States that syringes in box were not as box was labeled.   Advised to take box back to pharmacy and have them exchange for correct order.

## 2015-11-22 ENCOUNTER — Ambulatory Visit (INDEPENDENT_AMBULATORY_CARE_PROVIDER_SITE_OTHER): Payer: Medicare HMO | Admitting: Family Medicine

## 2015-11-22 VITALS — BP 170/80 | HR 64 | Temp 98.0°F | Resp 16 | Ht 63.0 in | Wt 142.0 lb

## 2015-11-22 DIAGNOSIS — Z794 Long term (current) use of insulin: Secondary | ICD-10-CM | POA: Diagnosis not present

## 2015-11-22 DIAGNOSIS — Z951 Presence of aortocoronary bypass graft: Secondary | ICD-10-CM | POA: Diagnosis not present

## 2015-11-22 DIAGNOSIS — N904 Leukoplakia of vulva: Secondary | ICD-10-CM | POA: Diagnosis not present

## 2015-11-22 DIAGNOSIS — N9489 Other specified conditions associated with female genital organs and menstrual cycle: Secondary | ICD-10-CM | POA: Diagnosis not present

## 2015-11-22 DIAGNOSIS — Z23 Encounter for immunization: Secondary | ICD-10-CM

## 2015-11-22 DIAGNOSIS — I10 Essential (primary) hypertension: Secondary | ICD-10-CM

## 2015-11-22 DIAGNOSIS — N898 Other specified noninflammatory disorders of vagina: Secondary | ICD-10-CM | POA: Diagnosis not present

## 2015-11-22 DIAGNOSIS — E119 Type 2 diabetes mellitus without complications: Secondary | ICD-10-CM

## 2015-11-22 DIAGNOSIS — Z8679 Personal history of other diseases of the circulatory system: Secondary | ICD-10-CM | POA: Diagnosis not present

## 2015-11-22 LAB — WET PREP FOR TRICH, YEAST, CLUE
CLUE CELLS WET PREP: NONE SEEN
TRICH WET PREP: NONE SEEN
Yeast Wet Prep HPF POC: NONE SEEN

## 2015-11-22 MED ORDER — TRIAMCINOLONE ACETONIDE 0.1 % EX CREA: 1.0000 "application " | TOPICAL_CREAM | Freq: Two times a day (BID) | CUTANEOUS | 0 refills | Status: DC

## 2015-11-22 NOTE — Progress Notes (Signed)
Subjective:    Patient ID: Misty Edwards, female    DOB: Oct 12, 1926, 82 y.o.   MRN: [id number]  HPI Patient is here today at our request to follow-up on her chronic medical conditions. She has a history of insulin-dependent diabetes mellitus. She is on NPH, 20 units in the morning and 10 units in the evening. She also takes 1 metformin a day. Her fasting blood sugars are between 101 150. Her two-hour postprandial sugars are under 200. She does occasionally have hypoglycemic episodes primarily at night. Her sugars have been as low as 65. Her blood pressure today is extremely high. She is not checking it at home. She is not taking hydralazine. She is unsure if she's taking amlodipine. She is taking metoprolol and Vasotec. She is concerned because of burning and irritation in her vagina and on her vulva. She denies any dysuria. She does have occasional vaginal odor. Wet prep was performed today but there was no sign of any yeast infection or clue cells. Past Medical History:  Diagnosis Date  . Acute biliary pancreatitis 07/2002   thia was in 07/2004:she still has pseudocyst in tail of pancreas measuring 54 x 33 mm   . Adrenal adenoma    bilateral  . Anxiety   . Chronic pancreatitis ([place])   . Detached retina   . Diabetes mellitus ([place])   . Diverticulosis   . History of carcinoma in situ of breast 1988  . Hyperlipidemia   . Hypertension   . IBS (irritable bowel syndrome)   . Legally blind   . Osteoarthritis   . Osteopenia   . Upper GI bleed September2004   secondary to gastritis   Past Surgical History:  Procedure Laterality Date  . CARDIAC CATHETERIZATION N/A 08/24/2014   Procedure: Left Heart Cath and Coronary Angiography;  Surgeon: Leonie Man, MD;  Location: [facility];  Service: Cardiovascular;  Laterality: N/A;  . COLONOSCOPY  08/15/05   few tiny diverticula at sigmoid colon/external hemorrhoids but no polyps  . COLONOSCOPY  01/08/2012   [id number] diverticulosis. Next  colonoscopy in 12/2016  . CORONARY ARTERY BYPASS GRAFT N/A 08/24/2014   Procedure: CORONARY ARTERY BYPASS GRAFTING (CABG) x three, using left internal mammary artery and right leg greater saphenous vein harvested endoscopically;  Surgeon: Ivin Poot, MD;  Location: [facility];  Service: Open Heart Surgery;  Laterality: N/A;  . ECTOPIC PREGNANCY SURGERY  1950's  . ERCP with sphincterotomy  07/2002  . ESOPHAGOGASTRODUODENOSCOPY      Gastritis of body.  Otherwise normal  . ESOPHAGOGASTRODUODENOSCOPY  01/08/2012   RMR: Few scattered gastric erosions of uncertain significance-status post biopsy. Minimal chronic inflammation, no H.pylori  . LAPAROSCOPIC CHOLECYSTECTOMY    . PANCREATIC PSEUDOCYST DRAINAGE  [id number]   drained percutaneously   . right mastectomy    . tacking up of her bladder    . TEE WITHOUT CARDIOVERSION N/A 08/24/2014   Procedure: TRANSESOPHAGEAL ECHOCARDIOGRAM (TEE);  Surgeon: Ivin Poot, MD;  Location: [facility];  Service: Open Heart Surgery;  Laterality: N/A;   Current Outpatient Prescriptions on File Prior to Visit  Medication Sig Dispense Refill  . acetaminophen (TYLENOL) 500 MG tablet Take 500-1,000 mg by mouth 2 (two) times daily as needed. Pain.    Marland Kitchen amLODipine (NORVASC) 5 MG tablet Take 1 tablet (5 mg total) by mouth daily. 90 tablet 3  . aspirin EC 81 MG EC tablet Take 1 tablet (81 mg total) by mouth daily.    . cetirizine (  ZYRTEC) 10 MG tablet TAKE ONE TABLET BY MOUTH ONCE DAILY 90 tablet 3  . CREON 24000 units CPEP TAKE TWO CAPSULES BY MOUTH WITH MEALS AND  ONE  CAPSULE  WITH  SNACKS 720 capsule 3  . cycloSPORINE (RESTASIS) 0.05 % ophthalmic emulsion Place 1 drop into both eyes 3 (three) times daily.     Marland Kitchen docusate sodium (COLACE) 100 MG capsule Take 1 capsule (100 mg total) by mouth 2 (two) times daily as needed for mild constipation. 60 capsule 5  . enalapril (VASOTEC) 20 MG tablet Take 1 tablet (20 mg total) by mouth daily. 90 tablet 3  . feeding supplement, ENSURE  ENLIVE, (ENSURE ENLIVE) LIQD Take 237 mLs by mouth 2 (two) times daily between meals. 237 mL 12  . FLUoxetine (PROZAC) 20 MG tablet Take 1 tablet (20 mg total) by mouth daily. 90 tablet 3  . furosemide (LASIX) 20 MG tablet Take 1 tablet (20 mg total) by mouth daily as needed. 30 tablet 5  . hydrALAZINE (APRESOLINE) 10 MG tablet Two Times Daily As Needed for Systolic BP above 0000000 (Patient taking differently: 10 mg as needed. Two Times Daily As Needed for Systolic BP above 0000000) 60 tablet 3  . insulin NPH Human (NOVOLIN N RELION) 100 UNIT/ML injection Inject 20 units in the AM, 10 units with supper 30 mL 3  . Insulin Syringes, Disposable, U-100 1 ML MISC 1/2 inch 31 g 300 each 3  . LORazepam (ATIVAN) 0.5 MG tablet TAKE ONE TO TWO TABLETS BY MOUTH AT BEDTIME 60 tablet 0  . metFORMIN (GLUCOPHAGE-XR) 500 MG 24 hr tablet TAKE ONE TABLET BY MOUTH ONCE DAILY WITH BREAKFAST 90 tablet 1  . metoprolol tartrate (LOPRESSOR) 25 MG tablet Take 1 tablet (25 mg total) by mouth 2 (two) times daily. 180 tablet 3  . Multiple Vitamin (MULTIVITAMIN) capsule Take 1 capsule by mouth daily.    Marland Kitchen omeprazole (PRILOSEC) 40 MG capsule Take 1 capsule (40 mg total) by mouth daily. 90 capsule 3  . ONE TOUCH ULTRA TEST test strip USE TO TEST UP TO 5 TIMES DAILY AS DIRECTED. 450 each 3  . potassium chloride (K-DUR,KLOR-CON) 10 MEQ tablet Take 1 tablet (10 mEq total) by mouth daily. 30 tablet 5  . promethazine (PHENERGAN) 12.5 MG tablet Take 0.5-1 tablets (6.25-12.5 mg total) by mouth every 6 (six) hours as needed for nausea or vomiting. 30 tablet 0  . rosuvastatin (CRESTOR) 5 MG tablet Take 1 tablet (5 mg total) by mouth daily. 90 tablet 3   No current facility-administered medications on file prior to visit.    Allergies  Allergen Reactions  . Calcium-Containing Compounds Nausea Only  . Raloxifene Itching    Face and eyes burning  . Vitamin D Analogs Nausea Only   Social History   Social History  . Marital status: Married     Spouse name: N/A  . Number of children: 5  . Years of education: N/A   Occupational History  . homemaker    Social History Main Topics  . Smoking status: Never Smoker  . Smokeless tobacco: Not on file  . Alcohol use No  . Drug use: No  . Sexual activity: No   Other Topics Concern  . Not on file   Social History Narrative   Lives in [place].      Review of Systems  All other systems reviewed and are negative.      Objective:   Physical Exam  Constitutional: She appears well-developed and  well-nourished.  Cardiovascular: Normal rate, regular rhythm and normal heart sounds.   Pulmonary/Chest: Effort normal and breath sounds normal.  Abdominal: Soft. Bowel sounds are normal. She exhibits no distension. There is no tenderness. There is no rebound.  Musculoskeletal: She exhibits no edema.  Vitals reviewed. The skin around the labia majora and labia minora is white and atrophic. There are some lichenification is. The vaginal mucosa is also atrophic. There is no bleeding. There are no papular lesions. There are no adhesions. The skin around the introitus is also right and atrophic.        Assessment & Plan:  Vaginal odor - Plan: Urinalysis, Routine w reflex microscopic (not at [facility]), [place], YEAST, CLUE  Insulin-requiring or dependent type II diabetes mellitus ([place]) - Plan: CBC with Differential/Platelet, COMPLETE METABOLIC PANEL WITH GFR, Lipid panel, Hemoglobin A1c  History of ASCVD - Plan: CBC with Differential/Platelet, COMPLETE METABOLIC PANEL WITH GFR, Lipid panel, Hemoglobin A1c  S/P CABG x 3 - Plan: CBC with Differential/Platelet, COMPLETE METABOLIC PANEL WITH GFR, Lipid panel, Hemoglobin A1c  Benign essential HTN  Vaginal discharge  Lichen sclerosus et atrophicus of the vulva - Plan: triamcinolone cream (KENALOG) 0.1 % I believe the patient's vaginal discharge is likely benign vaginal discharge. I believe the burning in the vagina and around  the labia is due to lichen sclerosus. I recommended trying triamcinolone cream twice daily as needed for irritation. If the rash worsens, I would recommend a biopsy to rule out malignancy. Check a hemoglobin A1c. Given her advanced age, I would be satisfied with a hemoglobin A1c less than 7.5. I would like her to return fasting for fasting lipid panel. She received her flu shot today. Her blood pressures extremely high. I recommended that she return in the morning and allow [place] to recheck her blood pressure to verify if this is accurate. I also want her to bring every one of her medicines that she is actually taking 2 that we can verify them on our list to determine what she is actually taking. It sounds like she may need to resume her amlodipine on a regular basis since she has not been apparently taking this or the hydralazine.

## 2015-11-22 NOTE — Addendum Note (Signed)
Addended by: Shary Decamp B on: 11/22/2015 05:11 PM   Modules accepted: Orders

## 2015-11-23 ENCOUNTER — Ambulatory Visit: Payer: Medicare HMO

## 2015-11-23 ENCOUNTER — Other Ambulatory Visit: Payer: Self-pay | Admitting: Family Medicine

## 2015-11-23 VITALS — BP 158/62 | HR 62

## 2015-11-23 DIAGNOSIS — I10 Essential (primary) hypertension: Secondary | ICD-10-CM

## 2015-11-23 LAB — LIPID PANEL
CHOLESTEROL: 125 mg/dL (ref 125–200)
HDL: 78 mg/dL (ref >=46)
LDL Cholesterol: 28 mg/dL (ref <130)
TRIGLYCERIDES: 95 mg/dL (ref <150)
Total CHOL/HDL Ratio: 1.6 Ratio (ref <=5.0)
VLDL: 19 mg/dL (ref <30)

## 2015-11-23 LAB — CBC WITH DIFFERENTIAL/PLATELET
BASOS PCT: 0 %
Basophils Absolute: 0 cells/uL (ref 0–200)
EOS ABS: 59 {cells}/uL (ref 15–500)
Eosinophils Relative: 1 %
HEMATOCRIT: 35.2 % (ref 35.0–45.0)
Hemoglobin: 11.2 g/dL — ABNORMAL LOW (ref 12.0–15.0)
LYMPHS PCT: 31 %
Lymphs Abs: 1829 cells/uL (ref 850–3900)
MCH: 25 pg — ABNORMAL LOW (ref 27.0–33.0)
MCHC: 31.8 g/dL — ABNORMAL LOW (ref 32.0–36.0)
MCV: 78.6 fL — AB (ref 80.0–100.0)
MONO ABS: 413 {cells}/uL (ref 200–950)
MPV: 11.4 fL (ref 7.5–12.5)
Monocytes Relative: 7 %
NEUTROS ABS: 3599 {cells}/uL (ref 1500–7800)
Neutrophils Relative %: 61 %
Platelets: 133 10*3/uL — ABNORMAL LOW (ref 140–400)
RBC: 4.48 MIL/uL (ref 3.80–5.10)
RDW: 14.9 % (ref 11.0–15.0)
WBC: 5.9 10*3/uL (ref 3.8–10.8)

## 2015-11-23 LAB — COMPLETE METABOLIC PANEL WITH GFR
ALBUMIN: 4.6 g/dL (ref 3.6–5.1)
ALT: 23 U/L (ref 6–29)
AST: 24 U/L (ref 10–35)
Alkaline Phosphatase: 41 U/L (ref 33–130)
BILIRUBIN TOTAL: 0.7 mg/dL (ref 0.2–1.2)
BUN: 17 mg/dL (ref 7–25)
CALCIUM: 9.7 mg/dL (ref 8.6–10.4)
CO2: 28 mmol/L (ref 20–31)
CREATININE: 0.93 mg/dL — AB (ref 0.60–0.88)
Chloride: 98 mmol/L (ref 98–110)
GFR, EST AFRICAN AMERICAN: 64 mL/min (ref >=60)
GFR, Est Non African American: 56 mL/min — ABNORMAL LOW (ref >=60)
Glucose, Bld: 172 mg/dL — ABNORMAL HIGH (ref 70–99)
Potassium: 4.7 mmol/L (ref 3.5–5.3)
Sodium: 133 mmol/L — ABNORMAL LOW (ref 135–146)
TOTAL PROTEIN: 7.3 g/dL (ref 6.1–8.1)

## 2015-11-23 LAB — HEMOGLOBIN A1C
Hgb A1c MFr Bld: 6.3 % — ABNORMAL HIGH (ref <5.7)
Mean Plasma Glucose: 134 mg/dL

## 2015-11-23 MED ORDER — LORAZEPAM 0.5 MG PO TABS: 0.5000 mg | ORAL_TABLET | Freq: Every day | ORAL | 1 refills | Status: DC

## 2016-01-16 DIAGNOSIS — R69 Illness, unspecified: Secondary | ICD-10-CM | POA: Diagnosis not present

## 2016-01-17 ENCOUNTER — Other Ambulatory Visit: Payer: Self-pay | Admitting: Family Medicine

## 2016-01-17 NOTE — Telephone Encounter (Signed)
ok 

## 2016-01-17 NOTE — Telephone Encounter (Signed)
Medication called to pharmacy. 

## 2016-01-17 NOTE — Telephone Encounter (Signed)
Ok to refill 

## 2016-02-06 ENCOUNTER — Other Ambulatory Visit: Payer: Self-pay | Admitting: Adult Health

## 2016-02-06 ENCOUNTER — Other Ambulatory Visit: Payer: Self-pay | Admitting: Cardiology

## 2016-02-06 ENCOUNTER — Other Ambulatory Visit: Payer: Self-pay | Admitting: Family Medicine

## 2016-02-06 DIAGNOSIS — Z951 Presence of aortocoronary bypass graft: Secondary | ICD-10-CM

## 2016-02-06 DIAGNOSIS — F32A Depression, unspecified: Secondary | ICD-10-CM

## 2016-02-06 DIAGNOSIS — F329 Major depressive disorder, single episode, unspecified: Secondary | ICD-10-CM

## 2016-02-18 ENCOUNTER — Encounter: Payer: Self-pay | Admitting: Cardiology

## 2016-02-18 ENCOUNTER — Ambulatory Visit (INDEPENDENT_AMBULATORY_CARE_PROVIDER_SITE_OTHER): Payer: Medicare HMO | Admitting: Cardiology

## 2016-02-18 VITALS — BP 128/60 | HR 68 | Ht 63.0 in | Wt 140.8 lb

## 2016-02-18 DIAGNOSIS — I340 Nonrheumatic mitral (valve) insufficiency: Secondary | ICD-10-CM | POA: Diagnosis not present

## 2016-02-18 DIAGNOSIS — E782 Mixed hyperlipidemia: Secondary | ICD-10-CM

## 2016-02-18 DIAGNOSIS — I10 Essential (primary) hypertension: Secondary | ICD-10-CM | POA: Diagnosis not present

## 2016-02-18 DIAGNOSIS — I2510 Atherosclerotic heart disease of native coronary artery without angina pectoris: Secondary | ICD-10-CM | POA: Diagnosis not present

## 2016-02-18 NOTE — Progress Notes (Signed)
Clinical Summary Misty Edwards is a 82 y.o.female seen today for follow up of the following medical problems.   1. CAD - history of emergent CABG June 2016 after presenting with NSTEMI - most recent echo 06/2015 LVEF 60-65%, grade II diastolic dysfunction - beta blocker dosing limited due to bradycardia.     - chest pain at times, better with belching. No SOB or DOE. - compliant with meds. Takes lasix just prn, about once a month ago.   2. HTN - checks at home, around 110-140s/60s - norvasc decreased to 5mg  daily, there was some question if the 10mg  dosing was contributing to LE edema.  - compliant with meds  3. Hyperlipidemia - 11/2015: TC 125 TG 95 HDL  78 LDL 28  - tolerating statin well  4. Mitral regurgitation - moderate by echo 06/2015.  - denies any recent symptoms Past Medical History:  Diagnosis Date  . Acute biliary pancreatitis 07/2002   thia was in 07/2004:she still has pseudocyst in tail of pancreas measuring 54 x 33 mm   . Adrenal adenoma    bilateral  . Anxiety   . Chronic pancreatitis ([place])   . Detached retina   . Diabetes mellitus ([place])   . Diverticulosis   . History of carcinoma in situ of breast 1988  . Hyperlipidemia   . Hypertension   . IBS (irritable bowel syndrome)   . Legally blind   . Osteoarthritis   . Osteopenia   . Upper GI bleed September2004   secondary to gastritis     Allergies  Allergen Reactions  . Calcium-Containing Compounds Nausea Only  . Raloxifene Itching    Face and eyes burning  . Vitamin D Analogs Nausea Only     Current Outpatient Prescriptions  Medication Sig Dispense Refill  . acetaminophen (TYLENOL) 500 MG tablet Take 500-1,000 mg by mouth 2 (two) times daily as needed. Pain.    Marland Kitchen amLODipine (NORVASC) 5 MG tablet Take 1 tablet (5 mg total) by mouth daily. 30 tablet 1  . aspirin EC 81 MG EC tablet Take 1 tablet (81 mg total) by mouth daily.    . cetirizine (ZYRTEC) 10 MG tablet TAKE ONE TABLET BY MOUTH ONCE  DAILY 90 tablet 3  . CREON 24000 units CPEP TAKE TWO CAPSULES BY MOUTH WITH MEALS AND  ONE  CAPSULE  WITH  SNACKS 720 capsule 3  . cycloSPORINE (RESTASIS) 0.05 % ophthalmic emulsion Place 1 drop into both eyes 3 (three) times daily.     Marland Kitchen docusate sodium (COLACE) 100 MG capsule Take 1 capsule (100 mg total) by mouth 2 (two) times daily as needed for mild constipation. (Patient not taking: Reported on 11/23/2015) 60 capsule 5  . enalapril (VASOTEC) 20 MG tablet TAKE ONE TABLET BY MOUTH ONCE DAILY 90 tablet 3  . feeding supplement, ENSURE ENLIVE, (ENSURE ENLIVE) LIQD Take 237 mLs by mouth 2 (two) times daily between meals. (Patient not taking: Reported on 11/23/2015) 237 mL 12  . FLUoxetine (PROZAC) 20 MG tablet TAKE ONE TABLET BY MOUTH ONCE DAILY 90 tablet 3  . furosemide (LASIX) 20 MG tablet Take 1 tablet (20 mg total) by mouth daily as needed. 30 tablet 5  . hydrALAZINE (APRESOLINE) 10 MG tablet Two Times Daily As Needed for Systolic BP above 0000000 (Patient taking differently: 10 mg as needed. Two Times Daily As Needed for Systolic BP above 0000000) 60 tablet 3  . insulin NPH Human (NOVOLIN N RELION) 100 UNIT/ML injection Inject 20  units in the AM, 10 units with supper 30 mL 3  . Insulin Syringes, Disposable, U-100 1 ML MISC 1/2 inch 31 g 300 each 3  . LORazepam (ATIVAN) 0.5 MG tablet TAKE ONE TO TWO TABLETS BY MOUTH AT BEDTIME AS NEEDED FOR INSOMNIA 60 tablet 1  . metFORMIN (GLUCOPHAGE-XR) 500 MG 24 hr tablet TAKE ONE TABLET BY MOUTH ONCE DAILY WITH BREAKFAST 90 tablet 1  . metoprolol tartrate (LOPRESSOR) 25 MG tablet Take 1 tablet (25 mg total) by mouth 2 (two) times daily. 180 tablet 3  . Multiple Vitamin (MULTIVITAMIN) capsule Take 1 capsule by mouth daily.    Marland Kitchen omeprazole (PRILOSEC) 40 MG capsule Take 1 capsule (40 mg total) by mouth daily. 90 capsule 3  . ONE TOUCH ULTRA TEST test strip USE TO TEST UP TO 5 TIMES DAILY AS DIRECTED. 450 each 3  . potassium chloride (K-DUR,KLOR-CON) 10 MEQ tablet Take 1  tablet (10 mEq total) by mouth daily. 30 tablet 5  . promethazine (PHENERGAN) 12.5 MG tablet Take 0.5-1 tablets (6.25-12.5 mg total) by mouth every 6 (six) hours as needed for nausea or vomiting. 30 tablet 0  . rosuvastatin (CRESTOR) 5 MG tablet Take 1 tablet (5 mg total) by mouth daily. 30 tablet 1  . triamcinolone cream (KENALOG) 0.1 % Apply 1 application topically 2 (two) times daily. 60 g 0   No current facility-administered medications for this visit.      Past Surgical History:  Procedure Laterality Date  . CARDIAC CATHETERIZATION N/A 08/24/2014   Procedure: Left Heart Cath and Coronary Angiography;  Surgeon: Leonie Man, MD;  Location: [facility];  Service: Cardiovascular;  Laterality: N/A;  . COLONOSCOPY  08/15/05   few tiny diverticula at sigmoid colon/external hemorrhoids but no polyps  . COLONOSCOPY  01/08/2012   [id number] diverticulosis. Next colonoscopy in 12/2016  . CORONARY ARTERY BYPASS GRAFT N/A 08/24/2014   Procedure: CORONARY ARTERY BYPASS GRAFTING (CABG) x three, using left internal mammary artery and right leg greater saphenous vein harvested endoscopically;  Surgeon: Ivin Poot, MD;  Location: [facility];  Service: Open Heart Surgery;  Laterality: N/A;  . ECTOPIC PREGNANCY SURGERY  1950's  . ERCP with sphincterotomy  07/2002  . ESOPHAGOGASTRODUODENOSCOPY      Gastritis of body.  Otherwise normal  . ESOPHAGOGASTRODUODENOSCOPY  01/08/2012   RMR: Few scattered gastric erosions of uncertain significance-status post biopsy. Minimal chronic inflammation, no H.pylori  . LAPAROSCOPIC CHOLECYSTECTOMY    . PANCREATIC PSEUDOCYST DRAINAGE  [id number]   drained percutaneously   . right mastectomy    . tacking up of her bladder    . TEE WITHOUT CARDIOVERSION N/A 08/24/2014   Procedure: TRANSESOPHAGEAL ECHOCARDIOGRAM (TEE);  Surgeon: Ivin Poot, MD;  Location: [facility];  Service: Open Heart Surgery;  Laterality: N/A;     Allergies  Allergen Reactions  .  Calcium-Containing Compounds Nausea Only  . Raloxifene Itching    Face and eyes burning  . Vitamin D Analogs Nausea Only      Family History  Problem Relation Age of Onset  . Ovarian cancer Mother     passed  . Colon cancer Father 49    passed away in his 38's  . Colon cancer Brother 73    surgery inhis 50's doing well now  . Heart attack Brother     passed away age 9  . Pancreatitis Brother   . Kidney disease Daughter   . Leukemia Son      Social  History Ms. Waheed reports that she has never smoked. She does not have any smokeless tobacco history on file. Ms. Debs reports that she does not drink alcohol.   Review of Systems CONSTITUTIONAL: No weight loss, fever, chills, weakness or fatigue.  HEENT: Eyes: No visual loss, blurred vision, double vision or yellow sclerae.No hearing loss, sneezing, congestion, runny nose or sore throat.  SKIN: No rash or itching.  CARDIOVASCULAR: per HPI RESPIRATORY: No shortness of breath, cough or sputum.  GASTROINTESTINAL: No anorexia, nausea, vomiting or diarrhea. No abdominal pain or blood.  GENITOURINARY: No burning on urination, no polyuria NEUROLOGICAL: No headache, dizziness, syncope, paralysis, ataxia, numbness or tingling in the extremities. No change in bowel or bladder control.  MUSCULOSKELETAL: No muscle, back pain, joint pain or stiffness.  LYMPHATICS: No enlarged nodes. No history of splenectomy.  PSYCHIATRIC: No history of depression or anxiety.  ENDOCRINOLOGIC: No reports of sweating, cold or heat intolerance. No polyuria or polydipsia.  Marland Kitchen   Physical Examination Vitals:   02/18/16 1309  BP: 128/60  Pulse: 68   Vitals:   02/18/16 1309  Weight: 140 lb 12.8 oz (63.9 kg)  Height: 5\' 3"  (1.6 m)    Gen: resting comfortably, no acute distress HEENT: no scleral icterus, pupils equal round and reactive, no palptable cervical adenopathy,  CV: RRR, no m/r/g, no jvd Resp: Clear to auscultation bilaterally GI: abdomen is  soft, non-tender, non-distended, normal bowel sounds, no hepatosplenomegaly MSK: extremities are warm, no edema.  Skin: warm, no rash Neuro:  no focal deficits Psych: appropriate affect   Diagnostic Studies 08/2014 cath  Severe multivessel CAD including left main and mid right.  Distal LM lesion, 95% stenosed.  Ost Cx lesion, 95% stenosed.  Ost LAD to Mid LAD lesion, 75% stenosed. Mid LAD lesion, 100% stenosed. After 2 septal perforators. Also noted is a proximal Ost 1st Diag to 1st Diag lesion, 100% stenosed.  Prox RCA to Mid RCA lesion, diffuse 70% stenosed with focal Mid RCA lesion, 95% stenosed.  Relatively well-preserved LV function with mild mid inferior hypokinesis and mild MR. Only minimally elevated LVEDP   06/2015 echo Study Conclusions  - Left ventricle: The cavity size was normal. Wall thickness was   normal. Systolic function was normal. The estimated ejection   fraction was in the range of 60% to 65%. Features are consistent   with a pseudonormal left ventricular filling pattern, with   concomitant abnormal relaxation and increased filling pressure   (grade 2 diastolic dysfunction). Doppler parameters are   consistent with high ventricular filling pressure. - Regional wall motion abnormality: Mild hypokinesis of the apical   anterior and apical septal myocardium. - Aortic valve: Mildly calcified annulus. Trileaflet. - Mitral valve: There was moderate regurgitation. - Left atrium: The atrium was moderately to severely dilated. - Pulmonic valve: There was mild regurgitation. Assessment and Plan   1. CAD - s/p CABG in June 2016 - no current symptoms. EKG in clinic shows SR without acute ischemic changes - we will continue current meds   2. HTN - bp is at goal  - we will continue current meds  3. Hyperlipidemia - lipids at goal, continue statin  4. Mitral regurgitation - moderate by most recent echo. Continue to monitor at this time   F/u 6  months     Arnoldo Lenis, M.D.

## 2016-02-18 NOTE — Patient Instructions (Signed)
Your physician wants you to follow-up in: 6 Months with Dr. Branch. You will receive a reminder letter in the mail two months in advance. If you don't receive a letter, please call our office to schedule the follow-up appointment.  Your physician recommends that you continue on your current medications as directed. Please refer to the Current Medication list given to you today.  If you need a refill on your cardiac medications before your next appointment, please call your pharmacy.  Thank you for choosing [facility]!   

## 2016-03-20 ENCOUNTER — Other Ambulatory Visit: Payer: Self-pay | Admitting: Family Medicine

## 2016-03-20 NOTE — Telephone Encounter (Signed)
Ok to refill 

## 2016-03-20 NOTE — Telephone Encounter (Signed)
Medication called to pharmacy. 

## 2016-03-20 NOTE — Telephone Encounter (Signed)
ok 

## 2016-04-17 DIAGNOSIS — R69 Illness, unspecified: Secondary | ICD-10-CM | POA: Diagnosis not present

## 2016-05-02 ENCOUNTER — Other Ambulatory Visit: Payer: Self-pay | Admitting: Cardiology

## 2016-05-02 DIAGNOSIS — Z951 Presence of aortocoronary bypass graft: Secondary | ICD-10-CM

## 2016-05-03 ENCOUNTER — Other Ambulatory Visit: Payer: Self-pay | Admitting: Family Medicine

## 2016-05-03 ENCOUNTER — Other Ambulatory Visit: Payer: Self-pay | Admitting: Cardiology

## 2016-05-16 ENCOUNTER — Other Ambulatory Visit: Payer: Self-pay | Admitting: Family Medicine

## 2016-05-19 ENCOUNTER — Other Ambulatory Visit: Payer: Self-pay | Admitting: Family Medicine

## 2016-05-19 NOTE — Telephone Encounter (Signed)
ok 

## 2016-05-19 NOTE — Telephone Encounter (Signed)
Medication called to pharmacy. 

## 2016-05-19 NOTE — Telephone Encounter (Signed)
Ok to refill 

## 2016-05-30 ENCOUNTER — Other Ambulatory Visit: Payer: Self-pay | Admitting: Cardiology

## 2016-05-30 DIAGNOSIS — Z951 Presence of aortocoronary bypass graft: Secondary | ICD-10-CM

## 2016-05-30 NOTE — Telephone Encounter (Signed)
Refilled rosuvastatin

## 2016-06-02 DIAGNOSIS — R69 Illness, unspecified: Secondary | ICD-10-CM | POA: Diagnosis not present

## 2016-06-09 ENCOUNTER — Other Ambulatory Visit: Payer: Self-pay | Admitting: Family Medicine

## 2016-06-09 MED ORDER — INSULIN NPH (HUMAN) (ISOPHANE) 100 UNIT/ML ~~LOC~~ SUSP: SUBCUTANEOUS | 3 refills | Status: DC

## 2016-07-17 ENCOUNTER — Other Ambulatory Visit: Payer: Self-pay | Admitting: Family Medicine

## 2016-07-17 NOTE — Telephone Encounter (Signed)
Ok to refill 

## 2016-07-18 NOTE — Telephone Encounter (Signed)
ok 

## 2016-07-22 ENCOUNTER — Other Ambulatory Visit: Payer: Self-pay | Admitting: Family Medicine

## 2016-07-22 DIAGNOSIS — N904 Leukoplakia of vulva: Secondary | ICD-10-CM

## 2016-07-22 MED ORDER — INSULIN NPH (HUMAN) (ISOPHANE) 100 UNIT/ML ~~LOC~~ SUSP: SUBCUTANEOUS | 3 refills | Status: DC

## 2016-07-22 MED ORDER — TRIAMCINOLONE ACETONIDE 0.1 % EX CREA: 1.0000 "application " | TOPICAL_CREAM | Freq: Two times a day (BID) | CUTANEOUS | 0 refills | Status: DC

## 2016-07-23 ENCOUNTER — Telehealth: Payer: Self-pay | Admitting: Family Medicine

## 2016-07-23 NOTE — Telephone Encounter (Signed)
Pts daughter calling to see if PA is done for novolin.

## 2016-07-24 MED ORDER — NOVOLIN N RELION 100 UNIT/ML ~~LOC~~ SUSP: SUBCUTANEOUS | 3 refills | Status: DC

## 2016-07-24 NOTE — Telephone Encounter (Signed)
Spoke to pharmacy and for [employer] to cover it has to be name brand - pharm run name brand and it is covered for $8 on her ins but they will have to order it and it will be in tomorrow afternoon. Misty Edwards aware.

## 2016-07-30 ENCOUNTER — Other Ambulatory Visit: Payer: Self-pay | Admitting: Family Medicine

## 2016-08-01 ENCOUNTER — Other Ambulatory Visit: Payer: Self-pay | Admitting: Family Medicine

## 2016-08-01 ENCOUNTER — Encounter: Payer: Self-pay | Admitting: Family Medicine

## 2016-08-01 ENCOUNTER — Ambulatory Visit (INDEPENDENT_AMBULATORY_CARE_PROVIDER_SITE_OTHER): Payer: Medicare HMO | Admitting: Family Medicine

## 2016-08-01 VITALS — BP 134/66 | HR 68 | Temp 98.0°F | Resp 18 | Wt 146.0 lb

## 2016-08-01 DIAGNOSIS — Z794 Long term (current) use of insulin: Secondary | ICD-10-CM | POA: Diagnosis not present

## 2016-08-01 DIAGNOSIS — Z951 Presence of aortocoronary bypass graft: Secondary | ICD-10-CM | POA: Diagnosis not present

## 2016-08-01 DIAGNOSIS — I10 Essential (primary) hypertension: Secondary | ICD-10-CM

## 2016-08-01 DIAGNOSIS — E1065 Type 1 diabetes mellitus with hyperglycemia: Secondary | ICD-10-CM | POA: Diagnosis not present

## 2016-08-01 DIAGNOSIS — IMO0001 Reserved for inherently not codable concepts without codable children: Secondary | ICD-10-CM

## 2016-08-01 DIAGNOSIS — E1165 Type 2 diabetes mellitus with hyperglycemia: Secondary | ICD-10-CM

## 2016-08-01 DIAGNOSIS — D649 Anemia, unspecified: Secondary | ICD-10-CM | POA: Diagnosis not present

## 2016-08-01 DIAGNOSIS — E1059 Type 1 diabetes mellitus with other circulatory complications: Secondary | ICD-10-CM | POA: Diagnosis not present

## 2016-08-01 LAB — CBC WITH DIFFERENTIAL/PLATELET
BASOS ABS: 45 {cells}/uL (ref 0–200)
BASOS PCT: 1 %
EOS ABS: 90 {cells}/uL (ref 15–500)
Eosinophils Relative: 2 %
HEMATOCRIT: 32.4 % — AB (ref 35.0–45.0)
HEMOGLOBIN: 10 g/dL — AB (ref 12.0–15.0)
LYMPHS ABS: 1125 {cells}/uL (ref 850–3900)
Lymphocytes Relative: 25 %
MCH: 25 pg — AB (ref 27.0–33.0)
MCHC: 30.9 g/dL — ABNORMAL LOW (ref 32.0–36.0)
MCV: 81 fL (ref 80.0–100.0)
MONO ABS: 450 {cells}/uL (ref 200–950)
MONOS PCT: 10 %
MPV: 10.3 fL (ref 7.5–12.5)
NEUTROS ABS: 2790 {cells}/uL (ref 1500–7800)
Neutrophils Relative %: 62 %
PLATELETS: 144 10*3/uL (ref 140–400)
RBC: 4 MIL/uL (ref 3.80–5.10)
RDW: 15.2 % — ABNORMAL HIGH (ref 11.0–15.0)
WBC: 4.5 10*3/uL (ref 3.8–10.8)

## 2016-08-01 LAB — COMPLETE METABOLIC PANEL WITH GFR
ALBUMIN: 4.6 g/dL (ref 3.6–5.1)
ALK PHOS: 37 U/L (ref 33–130)
ALT: 14 U/L (ref 6–29)
AST: 16 U/L (ref 10–35)
BILIRUBIN TOTAL: 0.5 mg/dL (ref 0.2–1.2)
BUN: 15 mg/dL (ref 7–25)
CO2: 22 mmol/L (ref 20–31)
CREATININE: 1.06 mg/dL — AB (ref 0.60–0.88)
Calcium: 9.7 mg/dL (ref 8.6–10.4)
Chloride: 98 mmol/L (ref 98–110)
GFR, EST AFRICAN AMERICAN: 55 mL/min — AB (ref >=60)
GFR, EST NON AFRICAN AMERICAN: 47 mL/min — AB (ref >=60)
Glucose, Bld: 168 mg/dL — ABNORMAL HIGH (ref 70–99)
Potassium: 5.1 mmol/L (ref 3.5–5.3)
Sodium: 132 mmol/L — ABNORMAL LOW (ref 135–146)
TOTAL PROTEIN: 7 g/dL (ref 6.1–8.1)

## 2016-08-01 LAB — LIPID PANEL
Cholesterol: 106 mg/dL (ref <200)
HDL: 67 mg/dL (ref >50)
LDL CALC: 22 mg/dL (ref <100)
TRIGLYCERIDES: 85 mg/dL (ref <150)
Total CHOL/HDL Ratio: 1.6 Ratio (ref <5.0)
VLDL: 17 mg/dL (ref <30)

## 2016-08-01 NOTE — Progress Notes (Signed)
Subjective:    Patient ID: Misty Edwards, female    DOB: 1926/04/03, 83 y.o.   MRN: [id number]  HPI  Patient presents for follow-up.She's not been seen in quite some time regarding her diabetes. At the present time she is taking 1 metformin a day. She is also on 25 units of Novolin N AM and 14 units PM.  She states that her fasting blood sugars are usually between 100-125 but her two-hour postprandial sugars are often 200-300. Past medical history is, care by the fact she has a history of coronary artery disease status post CABG 3 as well as essential hypertension.  She denies any chest pain shortness of breath or dyspnea on exertion. She denies any orthopnea. She denies any paroxysmal nocturnal dyspnea. She denies any pitting edema in her extremities. She did lose a daughter since I last saw the patient and is grieving. Past Medical History:  Diagnosis Date  . Acute biliary pancreatitis 07/2002   thia was in 07/2004:she still has pseudocyst in tail of pancreas measuring 54 x 33 mm   . Adrenal adenoma    bilateral  . Anxiety   . Chronic pancreatitis ([place])   . Detached retina   . Diabetes mellitus ([place])   . Diverticulosis   . History of carcinoma in situ of breast 1988  . Hyperlipidemia   . Hypertension   . IBS (irritable bowel syndrome)   . Legally blind   . Osteoarthritis   . Osteopenia   . Upper GI bleed September2004   secondary to gastritis   Past Surgical History:  Procedure Laterality Date  . CARDIAC CATHETERIZATION N/A 08/24/2014   Procedure: Left Heart Cath and Coronary Angiography;  Surgeon: Leonie Man, MD;  Location: [facility];  Service: Cardiovascular;  Laterality: N/A;  . COLONOSCOPY  08/15/05   few tiny diverticula at sigmoid colon/external hemorrhoids but no polyps  . COLONOSCOPY  01/08/2012   [id number] diverticulosis. Next colonoscopy in 12/2016  . CORONARY ARTERY BYPASS GRAFT N/A 08/24/2014   Procedure: CORONARY ARTERY BYPASS GRAFTING (CABG) x three,  using left internal mammary artery and right leg greater saphenous vein harvested endoscopically;  Surgeon: Ivin Poot, MD;  Location: [facility];  Service: Open Heart Surgery;  Laterality: N/A;  . ECTOPIC PREGNANCY SURGERY  1950's  . ERCP with sphincterotomy  07/2002  . ESOPHAGOGASTRODUODENOSCOPY      Gastritis of body.  Otherwise normal  . ESOPHAGOGASTRODUODENOSCOPY  01/08/2012   RMR: Few scattered gastric erosions of uncertain significance-status post biopsy. Minimal chronic inflammation, no H.pylori  . LAPAROSCOPIC CHOLECYSTECTOMY    . PANCREATIC PSEUDOCYST DRAINAGE  [id number]   drained percutaneously   . right mastectomy    . tacking up of her bladder    . TEE WITHOUT CARDIOVERSION N/A 08/24/2014   Procedure: TRANSESOPHAGEAL ECHOCARDIOGRAM (TEE);  Surgeon: Ivin Poot, MD;  Location: [facility];  Service: Open Heart Surgery;  Laterality: N/A;   Current Outpatient Prescriptions on File Prior to Visit  Medication Sig Dispense Refill  . acetaminophen (TYLENOL) 500 MG tablet Take 500-1,000 mg by mouth 2 (two) times daily as needed. Pain.    Marland Kitchen amLODipine (NORVASC) 5 MG tablet Take 1 tablet (5 mg total) by mouth daily. 30 tablet 1  . amLODipine (NORVASC) 5 MG tablet TAKE ONE TABLET BY MOUTH ONCE DAILY 90 tablet 3  . aspirin EC 81 MG EC tablet Take 1 tablet (81 mg total) by mouth daily.    . cetirizine (ZYRTEC) 10 MG  tablet TAKE ONE TABLET BY MOUTH ONCE DAILY 90 tablet 3  . CREON 24000 units CPEP TAKE TWO CAPSULES BY MOUTH WITH MEALS AND  ONE  CAPSULE  WITH  SNACKS 720 capsule 3  . cycloSPORINE (RESTASIS) 0.05 % ophthalmic emulsion Place 1 drop into both eyes 3 (three) times daily.     Marland Kitchen docusate sodium (COLACE) 100 MG capsule Take 1 capsule (100 mg total) by mouth 2 (two) times daily as needed for mild constipation. 60 capsule 5  . enalapril (VASOTEC) 20 MG tablet TAKE ONE TABLET BY MOUTH ONCE DAILY 90 tablet 3  . feeding supplement, ENSURE ENLIVE, (ENSURE ENLIVE) LIQD Take 237 mLs by mouth 2  (two) times daily between meals. 237 mL 12  . FLUoxetine (PROZAC) 20 MG tablet TAKE ONE TABLET BY MOUTH ONCE DAILY 90 tablet 3  . furosemide (LASIX) 20 MG tablet TAKE ONE TABLET BY MOUTH ONCE DAILY 90 tablet 2  . hydrALAZINE (APRESOLINE) 10 MG tablet Two Times Daily As Needed for Systolic BP above 696 (Patient taking differently: 10 mg as needed. Two Times Daily As Needed for Systolic BP above 295) 60 tablet 3  . Insulin Syringes, Disposable, U-100 1 ML MISC 1/2 inch 31 g 300 each 3  . LORazepam (ATIVAN) 0.5 MG tablet TAKE 1 TO 2 TABLETS BY MOUTH ONCE DAILY AT BEDTIME AS NEEDED FOR  INSOMNIA 60 tablet 1  . metFORMIN (GLUCOPHAGE-XR) 500 MG 24 hr tablet TAKE ONE TABLET BY MOUTH ONCE DAILY WITH BREAKFAST 90 tablet 1  . metoprolol tartrate (LOPRESSOR) 25 MG tablet TAKE ONE TABLET BY MOUTH TWICE DAILY 180 tablet 3  . Multiple Vitamin (MULTIVITAMIN) capsule Take 1 capsule by mouth daily.    Marland Kitchen NOVOLIN N RELION 100 UNIT/ML injection Inject 20 units in the AM, 10 units with supper 30 mL 3  . omeprazole (PRILOSEC) 40 MG capsule TAKE ONE CAPSULE BY MOUTH ONCE DAILY 90 capsule 3  . ONE TOUCH ULTRA TEST test strip USE TO TEST UP TO 5 TIMES DAILY AS DIRECTED. 450 each 3  . potassium chloride (K-DUR) 10 MEQ tablet TAKE ONE TABLET BY MOUTH ONCE DAILY 90 tablet 2  . potassium chloride (K-DUR,KLOR-CON) 10 MEQ tablet Take 1 tablet (10 mEq total) by mouth daily. 30 tablet 5  . promethazine (PHENERGAN) 12.5 MG tablet Take 0.5-1 tablets (6.25-12.5 mg total) by mouth every 6 (six) hours as needed for nausea or vomiting. 30 tablet 0  . rosuvastatin (CRESTOR) 5 MG tablet TAKE ONE TABLET BY MOUTH ONCE DAILY 30 tablet 1  . rosuvastatin (CRESTOR) 5 MG tablet TAKE ONE TABLET BY MOUTH ONCE DAILY 90 tablet 1  . triamcinolone cream (KENALOG) 0.1 % Apply 1 application topically 2 (two) times daily. 60 g 0   No current facility-administered medications on file prior to visit.    Allergies  Allergen Reactions  .  Calcium-Containing Compounds Nausea Only  . Raloxifene Itching    Face and eyes burning  . Vitamin D Analogs Nausea Only   Social History   Social History  . Marital status: Married    Spouse name: N/A  . Number of children: 5  . Years of education: N/A   Occupational History  . homemaker    Social History Main Topics  . Smoking status: Never Smoker  . Smokeless tobacco: Never Used  . Alcohol use No  . Drug use: No  . Sexual activity: No   Other Topics Concern  . Not on file   Social History Narrative  Lives in [place].   Widowed   Review of Systems  All other systems reviewed and are negative.      Objective:   Physical Exam  Constitutional: She appears well-developed and well-nourished. No distress.  HENT:  Head: Normocephalic and atraumatic.  Eyes: Conjunctivae are normal. Pupils are equal, round, and reactive to light.  Neck: Neck supple. No JVD present. No thyromegaly present.  Cardiovascular: Normal rate, regular rhythm, normal heart sounds and intact distal pulses.  Exam reveals no gallop.   No murmur heard. Pulmonary/Chest: Effort normal and breath sounds normal. No respiratory distress. She has no wheezes. She has no rales.  Abdominal: Soft. Bowel sounds are normal. She exhibits no distension. There is no tenderness. There is no rebound.  Musculoskeletal: She exhibits no edema.  Skin: She is not diaphoretic.  Vitals reviewed.         Assessment & Plan:  Essential hypertension  S/P CABG x 3  Uncontrolled type 2 diabetes mellitus without complication, with long-term current use of insulin ([place]) - Plan: CBC with Differential/Platelet, COMPLETE METABOLIC PANEL WITH GFR, Lipid panel, Microalbumin, urine, Hemoglobin A1c  Sugars do not sound well controlled. On sagittal believe the patient's checking them as often as she says she is. Therefore I will check a hemoglobin A1c. Given her advanced age, I would be happy with a hemoglobin A1c less than  7.5. If greater than 7.5, I would recommend switching the patient to basaglar given its longer duration of action and consistent profile rather than uptitrate novolin due to the risk of hypoglycemia.  Her blood pressure today is adequately controlled. There is no symptoms of unstable angina or congestive heart failure. I will also check a fasting lipid panel. Goal LDL cholesterol is less than 70. I will also check a urine microalbumin

## 2016-08-02 LAB — HEMOGLOBIN A1C
HEMOGLOBIN A1C: 6.1 % — AB (ref <5.7)
Mean Plasma Glucose: 128 mg/dL

## 2016-08-02 LAB — MICROALBUMIN, URINE: MICROALB UR: 1.6 mg/dL

## 2016-08-05 LAB — VITAMIN B12: VITAMIN B 12: 1092 pg/mL (ref 200–1100)

## 2016-08-05 LAB — IRON: IRON: 32 ug/dL — AB (ref 45–160)

## 2016-08-12 ENCOUNTER — Other Ambulatory Visit: Payer: Self-pay | Admitting: Family Medicine

## 2016-08-12 MED ORDER — INSULIN GLARGINE 100 UNIT/ML ~~LOC~~ SOLN: 25.0000 [IU] | SUBCUTANEOUS | 5 refills | Status: DC

## 2016-08-12 MED ORDER — PEN NEEDLES 31G X 6 MM MISC: 3 refills | Status: DC

## 2016-08-12 MED ORDER — INSULIN GLARGINE 100 UNITS/ML SOLOSTAR PEN: 25.0000 [IU] | PEN_INJECTOR | SUBCUTANEOUS | 0 refills | Status: DC

## 2016-08-12 MED ORDER — INSULIN GLARGINE 100 UNIT/ML SOLOSTAR PEN: 25.0000 [IU] | PEN_INJECTOR | Freq: Every day | SUBCUTANEOUS | 3 refills | Status: DC

## 2016-08-12 NOTE — Addendum Note (Signed)
Addended by: Shary Decamp B on: 08/12/2016 12:55 PM   Modules accepted: Orders

## 2016-08-13 ENCOUNTER — Other Ambulatory Visit: Payer: Medicare HMO

## 2016-08-13 DIAGNOSIS — D508 Other iron deficiency anemias: Secondary | ICD-10-CM

## 2016-08-14 LAB — FECAL OCCULT BLOOD, IMMUNOCHEMICAL
FECAL OCCULT BLOOD: NEGATIVE
Fecal Occult Blood: POSITIVE — AB
Fecal Occult Blood: POSITIVE — AB

## 2016-08-19 ENCOUNTER — Other Ambulatory Visit: Payer: Self-pay | Admitting: Family Medicine

## 2016-08-19 DIAGNOSIS — D509 Iron deficiency anemia, unspecified: Secondary | ICD-10-CM

## 2016-08-19 DIAGNOSIS — K921 Melena: Secondary | ICD-10-CM

## 2016-08-20 ENCOUNTER — Encounter: Payer: Self-pay | Admitting: Internal Medicine

## 2016-08-25 ENCOUNTER — Encounter: Payer: Self-pay | Admitting: Cardiology

## 2016-08-25 ENCOUNTER — Ambulatory Visit (INDEPENDENT_AMBULATORY_CARE_PROVIDER_SITE_OTHER): Payer: Medicare HMO | Admitting: Cardiology

## 2016-08-25 VITALS — BP 136/68 | HR 68 | Ht 63.0 in | Wt 144.0 lb

## 2016-08-25 DIAGNOSIS — I10 Essential (primary) hypertension: Secondary | ICD-10-CM

## 2016-08-25 DIAGNOSIS — I340 Nonrheumatic mitral (valve) insufficiency: Secondary | ICD-10-CM

## 2016-08-25 DIAGNOSIS — E782 Mixed hyperlipidemia: Secondary | ICD-10-CM

## 2016-08-25 DIAGNOSIS — I2510 Atherosclerotic heart disease of native coronary artery without angina pectoris: Secondary | ICD-10-CM

## 2016-08-25 NOTE — Patient Instructions (Addendum)

## 2016-08-25 NOTE — Progress Notes (Signed)
Clinical Summary Misty Edwards is a 83 y.o.female seen today for follow up of the following medical problems.   1. CAD - history of emergent CABG June 2016 after presenting with NSTEMI - most recent echo 06/2015 LVEF 60-65%, grade II diastolic dysfunction - beta blocker dosing limited due to bradycardia.     - can have some chest pain at times. Better with belching, can be affected by wearing bra. No exertional symptoms.  - complaint with meds.   2. HTN - norvasc decreased to 5mg  daily, there was some question if the 10mg  dosing was contributing to LE edema.  - checks at home, typically around 120s-130s/60s-70s  3. Hyperlipidemia - 07/2016 TC 106 TG 85 HDL 67 LDL 22 - tolerating statin  4. Mitral regurgitation - moderate by echo 06/2015.  - denies any recent symptoms since last visit   Past Medical History:  Diagnosis Date  . Acute biliary pancreatitis 07/2002   thia was in 07/2004:she still has pseudocyst in tail of pancreas measuring 54 x 33 mm   . Adrenal adenoma    bilateral  . Anxiety   . Chronic pancreatitis ([place])   . Detached retina   . Diabetes mellitus ([place])   . Diverticulosis   . History of carcinoma in situ of breast 1988  . Hyperlipidemia   . Hypertension   . IBS (irritable bowel syndrome)   . Legally blind   . Osteoarthritis   . Osteopenia   . Upper GI bleed September2004   secondary to gastritis     Allergies  Allergen Reactions  . Calcium-Containing Compounds Nausea Only  . Raloxifene Itching    Face and eyes burning  . Vitamin D Analogs Nausea Only     Current Outpatient Prescriptions  Medication Sig Dispense Refill  . acetaminophen (TYLENOL) 500 MG tablet Take 500-1,000 mg by mouth 2 (two) times daily as needed. Pain.    Marland Kitchen amLODipine (NORVASC) 5 MG tablet Take 1 tablet (5 mg total) by mouth daily. 30 tablet 1  . amLODipine (NORVASC) 5 MG tablet TAKE ONE TABLET BY MOUTH ONCE DAILY 90 tablet 3  . aspirin EC 81 MG EC tablet Take 1 tablet  (81 mg total) by mouth daily.    . cetirizine (ZYRTEC) 10 MG tablet TAKE ONE TABLET BY MOUTH ONCE DAILY 90 tablet 3  . CREON 24000 units CPEP TAKE TWO CAPSULES BY MOUTH WITH MEALS AND  ONE  CAPSULE  WITH  SNACKS 720 capsule 3  . cycloSPORINE (RESTASIS) 0.05 % ophthalmic emulsion Place 1 drop into both eyes 3 (three) times daily.     Marland Kitchen docusate sodium (COLACE) 100 MG capsule Take 1 capsule (100 mg total) by mouth 2 (two) times daily as needed for mild constipation. 60 capsule 5  . enalapril (VASOTEC) 20 MG tablet TAKE ONE TABLET BY MOUTH ONCE DAILY 90 tablet 3  . feeding supplement, ENSURE ENLIVE, (ENSURE ENLIVE) LIQD Take 237 mLs by mouth 2 (two) times daily between meals. 237 mL 12  . FLUoxetine (PROZAC) 20 MG tablet TAKE ONE TABLET BY MOUTH ONCE DAILY 90 tablet 3  . furosemide (LASIX) 20 MG tablet TAKE ONE TABLET BY MOUTH ONCE DAILY 90 tablet 2  . hydrALAZINE (APRESOLINE) 10 MG tablet Two Times Daily As Needed for Systolic BP above 601 (Patient taking differently: 10 mg as needed. Two Times Daily As Needed for Systolic BP above 093) 60 tablet 3  . insulin glargine (LANTUS) 100 UNIT/ML injection Inject 0.25 mLs (25 Units  total) into the skin every morning. 10 mL 5  . Insulin Syringes, Disposable, U-100 1 ML MISC 1/2 inch 31 g 300 each 3  . LORazepam (ATIVAN) 0.5 MG tablet TAKE 1 TO 2 TABLETS BY MOUTH ONCE DAILY AT BEDTIME AS NEEDED FOR  INSOMNIA 60 tablet 1  . metFORMIN (GLUCOPHAGE-XR) 500 MG 24 hr tablet TAKE ONE TABLET BY MOUTH ONCE DAILY WITH BREAKFAST 90 tablet 1  . metoprolol tartrate (LOPRESSOR) 25 MG tablet TAKE ONE TABLET BY MOUTH TWICE DAILY 180 tablet 3  . Multiple Vitamin (MULTIVITAMIN) capsule Take 1 capsule by mouth daily.    Marland Kitchen omeprazole (PRILOSEC) 40 MG capsule TAKE ONE CAPSULE BY MOUTH ONCE DAILY 90 capsule 3  . ONE TOUCH ULTRA TEST test strip USE TO TEST UP TO 5 TIMES DAILY AS DIRECTED. 450 each 3  . potassium chloride (K-DUR) 10 MEQ tablet TAKE ONE TABLET BY MOUTH ONCE DAILY 90  tablet 2  . potassium chloride (K-DUR,KLOR-CON) 10 MEQ tablet Take 1 tablet (10 mEq total) by mouth daily. 30 tablet 5  . promethazine (PHENERGAN) 12.5 MG tablet Take 0.5-1 tablets (6.25-12.5 mg total) by mouth every 6 (six) hours as needed for nausea or vomiting. 30 tablet 0  . rosuvastatin (CRESTOR) 5 MG tablet TAKE ONE TABLET BY MOUTH ONCE DAILY 30 tablet 1  . rosuvastatin (CRESTOR) 5 MG tablet TAKE ONE TABLET BY MOUTH ONCE DAILY 90 tablet 1  . triamcinolone cream (KENALOG) 0.1 % Apply 1 application topically 2 (two) times daily. 60 g 0   No current facility-administered medications for this visit.      Past Surgical History:  Procedure Laterality Date  . CARDIAC CATHETERIZATION N/A 08/24/2014   Procedure: Left Heart Cath and Coronary Angiography;  Surgeon: Leonie Man, MD;  Location: [facility];  Service: Cardiovascular;  Laterality: N/A;  . COLONOSCOPY  08/15/05   few tiny diverticula at sigmoid colon/external hemorrhoids but no polyps  . COLONOSCOPY  01/08/2012   ATF:[id number] diverticulosis. Next colonoscopy in 12/2016  . CORONARY ARTERY BYPASS GRAFT N/A 08/24/2014   Procedure: CORONARY ARTERY BYPASS GRAFTING (CABG) x three, using left internal mammary artery and right leg greater saphenous vein harvested endoscopically;  Surgeon: Ivin Poot, MD;  Location: [facility];  Service: Open Heart Surgery;  Laterality: N/A;  . ECTOPIC PREGNANCY SURGERY  1950's  . ERCP with sphincterotomy  07/2002  . ESOPHAGOGASTRODUODENOSCOPY      Gastritis of body.  Otherwise normal  . ESOPHAGOGASTRODUODENOSCOPY  01/08/2012   RMR: Few scattered gastric erosions of uncertain significance-status post biopsy. Minimal chronic inflammation, no H.pylori  . LAPAROSCOPIC CHOLECYSTECTOMY    . PANCREATIC PSEUDOCYST DRAINAGE  [id number]   drained percutaneously   . right mastectomy    . tacking up of her bladder    . TEE WITHOUT CARDIOVERSION N/A 08/24/2014   Procedure: TRANSESOPHAGEAL ECHOCARDIOGRAM (TEE);   Surgeon: Ivin Poot, MD;  Location: [facility];  Service: Open Heart Surgery;  Laterality: N/A;     Allergies  Allergen Reactions  . Calcium-Containing Compounds Nausea Only  . Raloxifene Itching    Face and eyes burning  . Vitamin D Analogs Nausea Only      Family History  Problem Relation Age of Onset  . Ovarian cancer Mother        passed  . Colon cancer Father 35       passed away in his 43's  . Colon cancer Brother 63       surgery inhis 50's doing  well now  . Heart attack Brother        passed away age 18  . Pancreatitis Brother   . Kidney disease Daughter   . Leukemia Son      Social History Ms. Rhude reports that she has never smoked. She has never used smokeless tobacco. Ms. Vastine reports that she does not drink alcohol.   Review of Systems CONSTITUTIONAL: No weight loss, fever, chills, weakness or fatigue.  HEENT: Eyes: No visual loss, blurred vision, double vision or yellow sclerae.No hearing loss, sneezing, congestion, runny nose or sore throat.  SKIN: No rash or itching.  CARDIOVASCULAR: per HPI RESPIRATORY: No shortness of breath, cough or sputum.  GASTROINTESTINAL: No anorexia, nausea, vomiting or diarrhea. No abdominal pain or blood.  GENITOURINARY: No burning on urination, no polyuria NEUROLOGICAL: No headache, dizziness, syncope, paralysis, ataxia, numbness or tingling in the extremities. No change in bowel or bladder control.  MUSCULOSKELETAL: No muscle, back pain, joint pain or stiffness.  LYMPHATICS: No enlarged nodes. No history of splenectomy.  PSYCHIATRIC: No history of depression or anxiety.  ENDOCRINOLOGIC: No reports of sweating, cold or heat intolerance. No polyuria or polydipsia.  Marland Kitchen   Physical Examination Vitals:   08/25/16 0859  BP: 136/68  Pulse: 68   Vitals:   08/25/16 0859  Weight: 144 lb (65.3 kg)  Height: 5\' 3"  (1.6 m)    Gen: resting comfortably, no acute distress HEENT: no scleral icterus, pupils equal round and  reactive, no palptable cervical adenopathy,  CV: RRR, 2/6 systolic murmur at apex, no nojvd Resp: Clear to auscultation bilaterally GI: abdomen is soft, non-tender, non-distended, normal bowel sounds, no hepatosplenomegaly MSK: extremities are warm, no edema.  Skin: warm, no rash Neuro:  no focal deficits Psych: appropriate affect   Diagnostic Studies 08/2014 cath  Severe multivessel CAD including left main and mid right.  Distal LM lesion, 95% stenosed.  Ost Cx lesion, 95% stenosed.  Ost LAD to Mid LAD lesion, 75% stenosed. Mid LAD lesion, 100% stenosed. After 2 septal perforators. Also noted is a proximal Ost 1st Diag to 1st Diag lesion, 100% stenosed.  Prox RCA to Mid RCA lesion, diffuse 70% stenosed with focal Mid RCA lesion, 95% stenosed.  Relatively well-preserved LV function with mild mid inferior hypokinesis and mild MR. Only minimally elevated LVEDP   06/2015 echo Study Conclusions  - Left ventricle: The cavity size was normal. Wall thickness was normal. Systolic function was normal. The estimated ejection fraction was in the range of 60% to 65%. Features are consistent with a pseudonormal left ventricular filling pattern, with concomitant abnormal relaxation and increased filling pressure (grade 2 diastolic dysfunction). Doppler parameters are consistent with high ventricular filling pressure. - Regional wall motion abnormality: Mild hypokinesis of the apical anterior and apical septal myocardium. - Aortic valve: Mildly calcified annulus. Trileaflet. - Mitral valve: There was moderate regurgitation. - Left atrium: The atrium was moderately to severely dilated. - Pulmonic valve: There was mild regurgitation.    Assessment and Plan  1. CAD - s/p CABG in June 2016 - recent atypical chest pain, probable GERD and MSK pain. No exertional chest pain - continue current meds   2. HTN -her  bp is at goal  - she will continue current meds  3.  Hyperlipidemia - lipids are at goal, continue statin  4. Mitral regurgitation - moderate by most recent echo.  - no symptoms. Likely repeat echo next visit   F/u 6 months  Arnoldo Lenis, M.D.

## 2016-09-02 ENCOUNTER — Other Ambulatory Visit: Payer: Self-pay | Admitting: Family Medicine

## 2016-09-02 DIAGNOSIS — R69 Illness, unspecified: Secondary | ICD-10-CM | POA: Diagnosis not present

## 2016-09-04 ENCOUNTER — Telehealth: Payer: Self-pay | Admitting: Family Medicine

## 2016-09-04 NOTE — Telephone Encounter (Signed)
Tried to call Misty Edwards no answer and vm full

## 2016-09-04 NOTE — Telephone Encounter (Signed)
Debra aware of receommendations

## 2016-09-04 NOTE — Telephone Encounter (Signed)
Increase basaglar to 35 units a day and recheck in 2 weeks

## 2016-09-04 NOTE — Telephone Encounter (Signed)
Pt was recently changed on her insulin - she is taking 25 units in the am and her BS has been 129m,228,195 and in the afternoon it is in the mid-high 200's. She is wondering if she needs to increase dose or maybe take some extra in afternoon. She is not eating in the afternoons b/c her bs is so high. MD Please advise.   Debra # [phone]

## 2016-09-05 ENCOUNTER — Encounter: Payer: Self-pay | Admitting: Gastroenterology

## 2016-09-05 ENCOUNTER — Ambulatory Visit (INDEPENDENT_AMBULATORY_CARE_PROVIDER_SITE_OTHER): Payer: Medicare HMO | Admitting: Gastroenterology

## 2016-09-05 VITALS — BP 139/68 | HR 61 | Temp 97.6°F | Ht 63.0 in | Wt 145.0 lb

## 2016-09-05 DIAGNOSIS — R195 Other fecal abnormalities: Secondary | ICD-10-CM | POA: Insufficient documentation

## 2016-09-05 DIAGNOSIS — K219 Gastro-esophageal reflux disease without esophagitis: Secondary | ICD-10-CM | POA: Diagnosis not present

## 2016-09-05 DIAGNOSIS — D649 Anemia, unspecified: Secondary | ICD-10-CM

## 2016-09-05 LAB — CBC WITH DIFFERENTIAL/PLATELET
BASOS PCT: 1 %
Basophils Absolute: 55 cells/uL (ref 0–200)
EOS ABS: 55 {cells}/uL (ref 15–500)
Eosinophils Relative: 1 %
HEMATOCRIT: 32.9 % — AB (ref 35.0–45.0)
Hemoglobin: 10.4 g/dL — ABNORMAL LOW (ref 11.7–15.5)
LYMPHS ABS: 1430 {cells}/uL (ref 850–3900)
Lymphocytes Relative: 26 %
MCH: 26 pg — ABNORMAL LOW (ref 27.0–33.0)
MCHC: 31.6 g/dL — ABNORMAL LOW (ref 32.0–36.0)
MCV: 82.3 fL (ref 80.0–100.0)
MONO ABS: 275 {cells}/uL (ref 200–950)
MPV: 10.7 fL (ref 7.5–12.5)
Monocytes Relative: 5 %
NEUTROS ABS: 3685 {cells}/uL (ref 1500–7800)
Neutrophils Relative %: 67 %
Platelets: 147 10*3/uL (ref 140–400)
RBC: 4 MIL/uL (ref 3.80–5.10)
RDW: 16.9 % — ABNORMAL HIGH (ref 11.0–15.0)
WBC: 5.5 10*3/uL (ref 3.8–10.8)

## 2016-09-05 LAB — FERRITIN: Ferritin: 23 ng/mL (ref 20–288)

## 2016-09-05 NOTE — Progress Notes (Signed)
cc'ed to pcp °

## 2016-09-05 NOTE — Assessment & Plan Note (Signed)
83 year old female presenting for further evaluation of normocytic anemia, heme-positive stool. Stools have been darker than usual. Patient has limited vision. She's had some indigestion but otherwise GI symptoms have been stable in the setting of chronic pancreatitis/IBS. Last colonoscopy 2013. Family history significant for colon cancer in 2 first-degree relatives at early age. She is on aspirin daily but no other blood thinners. No NSAIDs otherwise. Occult GI bleeding likely, could be from anywhere in the GI tract. Discussed options, specifically consideration of colonoscopy plus or minus EGD initially. Patient does not want to drink gallon prep, would consider short volume Prep available. Will discuss case with Dr. Gala Romney prior scheduling. Try to get her sample of smaller prep if colonoscopy is pursued. We will repeat her CBC today. Further recommendations to follow.

## 2016-09-05 NOTE — Progress Notes (Signed)
Primary Care Physician:  Susy Frizzle, MD  Primary Gastroenterologist:  Garfield Cornea, MD   Chief Complaint  Patient presents with  . Anemia    HPI:  Misty Edwards is a 83 y.o. female here for further evaluation of anemia and blood in the stool. We last saw her in 2015. She has a history of IBS, chronic pancreatitis and pseudocyst formation requiring drainage. Last EGD October 2013 she had few scattered gastric erosions with minimal inflammation on biopsy, no H pylori. Last colonoscopy October 2013 colonic diverticulosis.  Father died with colon cancer in his 61s, brother had colon cancer in his 29s.  Since we last saw her she had an emergent CABG in June 2016 after presenting with NSTEMI.  Recently she was Hemoccult positive 2. Hemoglobin 10, hematocrit 32.4, MCV 81, platelets 144,000, iron 32 low, vitamin B12 normal. Hemoglobin was 11.2 back in September. Hemoglobin was 10.4 back in August 2016.She's had some dizziness. No shortness of breath. No chest pain. Typically bowel movements 1-2 times daily, loose to runny. Somewhat poor vision but feels like her stools are dark brown. Denies black stools. She has had some indigestion but no heartburn. Occasional abdominal pain but nothing regularly. No dysphagia. No vomiting. Appetite has been fairly good.  She continues Creon, omeprazole. Recently started iron a couple weeks ago. Stools were darker before then.  Recently lost her daughter. Her son is not doing well. Lost her husband a couple years ago.  Current Outpatient Prescriptions  Medication Sig Dispense Refill  . acetaminophen (TYLENOL) 500 MG tablet Take 500-1,000 mg by mouth 2 (two) times daily as needed. Pain.    Marland Kitchen amLODipine (NORVASC) 5 MG tablet TAKE ONE TABLET BY MOUTH ONCE DAILY 90 tablet 3  . aspirin EC 81 MG EC tablet Take 1 tablet (81 mg total) by mouth daily.    . cetirizine (ZYRTEC) 10 MG tablet TAKE ONE TABLET BY MOUTH ONCE DAILY 90 tablet 3  . CREON 24000 units CPEP  TAKE TWO CAPSULES BY MOUTH WITH MEALS AND  ONE  CAPSULE  WITH  SNACKS 720 capsule 3  . cycloSPORINE (RESTASIS) 0.05 % ophthalmic emulsion Place 1 drop into both eyes 3 (three) times daily.     Marland Kitchen docusate sodium (COLACE) 100 MG capsule Take 1 capsule (100 mg total) by mouth 2 (two) times daily as needed for mild constipation. 60 capsule 5  . enalapril (VASOTEC) 20 MG tablet TAKE ONE TABLET BY MOUTH ONCE DAILY 90 tablet 3  . feeding supplement, ENSURE ENLIVE, (ENSURE ENLIVE) LIQD Take 237 mLs by mouth 2 (two) times daily between meals. 237 mL 12  . ferrous sulfate 325 (65 FE) MG tablet Take 325 mg by mouth daily with breakfast.    . FLUoxetine (PROZAC) 20 MG tablet TAKE ONE TABLET BY MOUTH ONCE DAILY 90 tablet 3  . furosemide (LASIX) 20 MG tablet TAKE ONE TABLET BY MOUTH ONCE DAILY 90 tablet 2  . hydrALAZINE (APRESOLINE) 10 MG tablet Two Times Daily As Needed for Systolic BP above 263 (Patient taking differently: 10 mg as needed. Two Times Daily As Needed for Systolic BP above 785) 60 tablet 3  . insulin glargine (LANTUS) 100 UNIT/ML injection Inject 0.25 mLs (25 Units total) into the skin every morning. (Patient taking differently: Inject 35 Units into the skin every morning. ) 10 mL 5  . Insulin Syringes, Disposable, U-100 1 ML MISC 1/2 inch 31 g 300 each 3  . LORazepam (ATIVAN) 0.5 MG tablet TAKE 1 TO  2 TABLETS BY MOUTH ONCE DAILY AT BEDTIME AS NEEDED FOR  INSOMNIA 60 tablet 1  . metFORMIN (GLUCOPHAGE-XR) 500 MG 24 hr tablet TAKE ONE TABLET BY MOUTH ONCE DAILY WITH BREAKFAST 90 tablet 1  . metoprolol tartrate (LOPRESSOR) 25 MG tablet TAKE ONE TABLET BY MOUTH TWICE DAILY 180 tablet 3  . Multiple Vitamin (MULTIVITAMIN) capsule Take 1 capsule by mouth daily.    Marland Kitchen omeprazole (PRILOSEC) 40 MG capsule TAKE ONE CAPSULE BY MOUTH ONCE DAILY 90 capsule 3  . ONE TOUCH ULTRA TEST test strip USE TO TEST UP TO 5 TIMES DAILY AS DIRECTED. 450 each 0  . potassium chloride (K-DUR) 10 MEQ tablet TAKE ONE TABLET BY  MOUTH ONCE DAILY 90 tablet 2  . promethazine (PHENERGAN) 12.5 MG tablet Take 0.5-1 tablets (6.25-12.5 mg total) by mouth every 6 (six) hours as needed for nausea or vomiting. 30 tablet 0  . rosuvastatin (CRESTOR) 5 MG tablet TAKE ONE TABLET BY MOUTH ONCE DAILY 30 tablet 1  . triamcinolone cream (KENALOG) 0.1 % Apply 1 application topically 2 (two) times daily. 60 g 0   No current facility-administered medications for this visit.     Allergies as of 09/05/2016 - Review Complete 09/05/2016  Allergen Reaction Noted  . Calcium-containing compounds Nausea Only 08/21/2011  . Raloxifene Itching 07/19/2013  . Vitamin d analogs Nausea Only 08/21/2011    Past Medical History:  Diagnosis Date  . Acute biliary pancreatitis 07/2002   thia was in 07/2004:she still has pseudocyst in tail of pancreas measuring 54 x 33 mm   . Adrenal adenoma    bilateral  . Anxiety   . Chronic pancreatitis ([place])   . Detached retina   . Diabetes mellitus ([place])   . Diverticulosis   . History of carcinoma in situ of breast 1988  . Hyperlipidemia   . Hypertension   . IBS (irritable bowel syndrome)   . Legally blind   . Osteoarthritis   . Osteopenia   . Upper GI bleed September2004   secondary to gastritis    Past Surgical History:  Procedure Laterality Date  . CARDIAC CATHETERIZATION N/A 08/24/2014   Procedure: Left Heart Cath and Coronary Angiography;  Surgeon: Leonie Man, MD;  Location: [facility];  Service: Cardiovascular;  Laterality: N/A;  . COLONOSCOPY  08/15/05   few tiny diverticula at sigmoid colon/external hemorrhoids but no polyps  . COLONOSCOPY  01/08/2012   YQM:VHQIONG diverticulosis. Next colonoscopy in 12/2016  . CORONARY ARTERY BYPASS GRAFT N/A 08/24/2014   Procedure: CORONARY ARTERY BYPASS GRAFTING (CABG) x three, using left internal mammary artery and right leg greater saphenous vein harvested endoscopically;  Surgeon: Ivin Poot, MD;  Location: [facility];  Service: Open Heart  Surgery;  Laterality: N/A;  . ECTOPIC PREGNANCY SURGERY  1950's  . ERCP with sphincterotomy  07/2002  . ESOPHAGOGASTRODUODENOSCOPY      Gastritis of body.  Otherwise normal  . ESOPHAGOGASTRODUODENOSCOPY  01/08/2012   RMR: Few scattered gastric erosions of uncertain significance-status post biopsy. Minimal chronic inflammation, no H.pylori  . LAPAROSCOPIC CHOLECYSTECTOMY    . PANCREATIC PSEUDOCYST DRAINAGE  [id number]   drained percutaneously   . right mastectomy    . tacking up of her bladder    . TEE WITHOUT CARDIOVERSION N/A 08/24/2014   Procedure: TRANSESOPHAGEAL ECHOCARDIOGRAM (TEE);  Surgeon: Ivin Poot, MD;  Location: [facility];  Service: Open Heart Surgery;  Laterality: N/A;    Family History  Problem Relation Age of Onset  .  Ovarian cancer Mother        passed  . Colon cancer Father 90       passed away in his 7's  . Colon cancer Brother 43       surgery inhis 50's doing well now  . Heart attack Brother        passed away age 48  . Pancreatitis Brother   . Kidney disease Daughter   . Leukemia Son     Social History   Social History  . Marital status: Married    Spouse name: N/A  . Number of children: 5  . Years of education: N/A   Occupational History  . homemaker    Social History Main Topics  . Smoking status: Never Smoker  . Smokeless tobacco: Never Used  . Alcohol use No  . Drug use: No  . Sexual activity: No   Other Topics Concern  . Not on file   Social History Narrative   Lives in [place].      ROS:  General: Negative for anorexia, weight loss, fever, chills, fatigue, weakness.See history of present illness. Eyes: Negative for vision changes.  ENT: Negative for hoarseness, difficulty swallowing , nasal congestion. CV: Negative for chest pain, angina, palpitations, dyspnea on exertion, peripheral edema.  Respiratory: Negative for dyspnea at rest, dyspnea on exertion, cough, sputum, wheezing.  GI: See history of present illness. GU:   Negative for dysuria, hematuria, urinary incontinence, urinary frequency, nocturnal urination.  MS: Negative for joint pain, low back pain.  Derm: Negative for rash or itching.  Neuro: Negative for weakness, abnormal sensation, seizure, frequent headaches, memory loss, confusion.  Psych: Negative for anxiety, depression, suicidal ideation, hallucinations.  Endo: Negative for unusual weight change.  Heme: Negative for bruising or bleeding. Allergy: Negative for rash or hives.    Physical Examination:  BP 139/68   Pulse 61   Temp 97.6 F (36.4 C) (Oral)   Ht 5\' 3"  (1.6 m)   Wt 145 lb (65.8 kg)   BMI 25.69 kg/m    General: Elderly white female. Appears her stated age. Accompanied by her daughter. No acute distress. Somewhat pale. Head: Normocephalic, atraumatic.   Eyes: Conjunctiva pale, no icterus. Mouth: Oropharyngeal mucosa moist and pink , no lesions erythema or exudate. Neck: Supple without thyromegaly, masses, or lymphadenopathy.  Lungs: Clear to auscultation bilaterally.  Heart: Regular rate and rhythm, no murmurs rubs or gallops.  Abdomen: Bowel sounds are normal, nontender, nondistended, no hepatosplenomegaly or masses, no abdominal bruits or    hernia , no rebound or guarding.   Rectal: Not performed Extremities: No lower extremity edema. No clubbing or deformities.  Neuro: Alert and oriented x 4 , grossly normal neurologically.  Skin: Warm and dry, no rash or jaundice.   Psych: Alert and cooperative, normal mood and affect.  Labs: Lab Results  Component Value Date   IRON 32 (L) 08/01/2016              Lab Results  Component Value Date   WBC 4.5 08/01/2016   HGB 10.0 (L) 08/01/2016   HCT 32.4 (L) 08/01/2016   MCV 81.0 08/01/2016   PLT 144 08/01/2016   Lab Results  Component Value Date   CREATININE 1.06 (H) 08/01/2016   BUN 15 08/01/2016   NA 132 (L) 08/01/2016   K 5.1 08/01/2016   CL 98 08/01/2016   CO2 22 08/01/2016   Lab Results  Component Value  Date   ALT 14 08/01/2016  AST 16 08/01/2016   ALKPHOS 37 08/01/2016   BILITOT 0.5 08/01/2016   No results found for: GGT Lab Results  Component Value Date   HGBA1C 6.1 (H) 08/01/2016     Imaging Studies: No results found.

## 2016-09-05 NOTE — Patient Instructions (Signed)
1. Please have your labs done. 2. We will touch base first of the week regarding possible colonoscopy and upper endoscopy once I discuss with Dr. Gala Romney.

## 2016-09-09 NOTE — Progress Notes (Signed)
Please let patient know that RMR recommends TCS+/-EGD for anemia and heme + stools.   Hold iron 7 days.  Day before procedures: Lantus 1/2 dose, metformin hold,  Day of procedures: hold diabetes meds.

## 2016-09-10 ENCOUNTER — Other Ambulatory Visit: Payer: Self-pay

## 2016-09-10 DIAGNOSIS — R195 Other fecal abnormalities: Secondary | ICD-10-CM

## 2016-09-10 DIAGNOSIS — D649 Anemia, unspecified: Secondary | ICD-10-CM

## 2016-09-10 MED ORDER — PEG-KCL-NACL-NASULF-NA ASC-C 100 G PO SOLR: 1.0000 | ORAL | 0 refills | Status: DC

## 2016-09-10 NOTE — Progress Notes (Signed)
Called and spoke to pt's daughter [name]). Informed her of RMR's recommendation. She is ok with her mom having TCS/+/-EGD. Procedure scheduled for 10/23/16 at 10:15am. Rx for Moviprep sent to pharmacy. Informed daughter for pt to hold Iron for 7 days before procedure and diabetic medication adjustments (also noted on instructions). Instructions mailed. Orders entered.

## 2016-09-18 ENCOUNTER — Other Ambulatory Visit: Payer: Self-pay | Admitting: Family Medicine

## 2016-09-18 NOTE — Telephone Encounter (Signed)
Ok to refill 

## 2016-09-18 NOTE — Telephone Encounter (Signed)
ok 

## 2016-09-18 NOTE — Progress Notes (Signed)
Please let pt know h/h stable. Procedures as planned. Await flew indings.

## 2016-09-26 ENCOUNTER — Emergency Department (HOSPITAL_COMMUNITY): Payer: MEDICARE

## 2016-09-26 ENCOUNTER — Encounter (HOSPITAL_COMMUNITY): Payer: Self-pay

## 2016-09-26 ENCOUNTER — Inpatient Hospital Stay (HOSPITAL_COMMUNITY)
Admission: EM | Admit: 2016-09-26 | Discharge: 2016-09-28 | DRG: 694 | Disposition: A | Discharge status: Home / Self Care | Payer: MEDICARE | Attending: Family Medicine | Admitting: Family Medicine

## 2016-09-26 DIAGNOSIS — Z951 Presence of aortocoronary bypass graft: Secondary | ICD-10-CM | POA: Diagnosis not present

## 2016-09-26 DIAGNOSIS — R1031 Right lower quadrant pain: Secondary | ICD-10-CM | POA: Diagnosis not present

## 2016-09-26 DIAGNOSIS — I10 Essential (primary) hypertension: Secondary | ICD-10-CM | POA: Diagnosis present

## 2016-09-26 DIAGNOSIS — Z79899 Other long term (current) drug therapy: Secondary | ICD-10-CM

## 2016-09-26 DIAGNOSIS — R109 Unspecified abdominal pain: Secondary | ICD-10-CM

## 2016-09-26 DIAGNOSIS — Z888 Allergy status to other drugs, medicaments and biological substances status: Secondary | ICD-10-CM

## 2016-09-26 DIAGNOSIS — F419 Anxiety disorder, unspecified: Secondary | ICD-10-CM | POA: Diagnosis present

## 2016-09-26 DIAGNOSIS — N201 Calculus of ureter: Secondary | ICD-10-CM

## 2016-09-26 DIAGNOSIS — Z87442 Personal history of urinary calculi: Secondary | ICD-10-CM | POA: Diagnosis not present

## 2016-09-26 DIAGNOSIS — Z7982 Long term (current) use of aspirin: Secondary | ICD-10-CM

## 2016-09-26 DIAGNOSIS — K219 Gastro-esophageal reflux disease without esophagitis: Secondary | ICD-10-CM | POA: Diagnosis not present

## 2016-09-26 DIAGNOSIS — D649 Anemia, unspecified: Secondary | ICD-10-CM | POA: Diagnosis present

## 2016-09-26 DIAGNOSIS — K861 Other chronic pancreatitis: Secondary | ICD-10-CM | POA: Diagnosis present

## 2016-09-26 DIAGNOSIS — Z841 Family history of disorders of kidney and ureter: Secondary | ICD-10-CM

## 2016-09-26 DIAGNOSIS — Z87440 Personal history of urinary (tract) infections: Secondary | ICD-10-CM

## 2016-09-26 DIAGNOSIS — R140 Abdominal distension (gaseous): Secondary | ICD-10-CM | POA: Diagnosis not present

## 2016-09-26 DIAGNOSIS — N200 Calculus of kidney: Secondary | ICD-10-CM

## 2016-09-26 DIAGNOSIS — I959 Hypotension, unspecified: Secondary | ICD-10-CM | POA: Diagnosis not present

## 2016-09-26 DIAGNOSIS — N179 Acute kidney failure, unspecified: Secondary | ICD-10-CM | POA: Diagnosis present

## 2016-09-26 DIAGNOSIS — J189 Pneumonia, unspecified organism: Secondary | ICD-10-CM | POA: Diagnosis not present

## 2016-09-26 DIAGNOSIS — E1151 Type 2 diabetes mellitus with diabetic peripheral angiopathy without gangrene: Secondary | ICD-10-CM | POA: Diagnosis not present

## 2016-09-26 DIAGNOSIS — R112 Nausea with vomiting, unspecified: Secondary | ICD-10-CM | POA: Diagnosis not present

## 2016-09-26 DIAGNOSIS — J918 Pleural effusion in other conditions classified elsewhere: Secondary | ICD-10-CM | POA: Diagnosis not present

## 2016-09-26 DIAGNOSIS — Z853 Personal history of malignant neoplasm of breast: Secondary | ICD-10-CM

## 2016-09-26 DIAGNOSIS — K589 Irritable bowel syndrome without diarrhea: Secondary | ICD-10-CM | POA: Diagnosis not present

## 2016-09-26 DIAGNOSIS — H548 Legal blindness, as defined in USA: Secondary | ICD-10-CM | POA: Diagnosis not present

## 2016-09-26 DIAGNOSIS — E1159 Type 2 diabetes mellitus with other circulatory complications: Secondary | ICD-10-CM | POA: Diagnosis present

## 2016-09-26 DIAGNOSIS — N1330 Unspecified hydronephrosis: Secondary | ICD-10-CM

## 2016-09-26 DIAGNOSIS — E785 Hyperlipidemia, unspecified: Secondary | ICD-10-CM | POA: Diagnosis not present

## 2016-09-26 DIAGNOSIS — Z794 Long term (current) use of insulin: Secondary | ICD-10-CM

## 2016-09-26 DIAGNOSIS — Z86010 Personal history of colonic polyps: Secondary | ICD-10-CM

## 2016-09-26 DIAGNOSIS — I2510 Atherosclerotic heart disease of native coronary artery without angina pectoris: Secondary | ICD-10-CM | POA: Diagnosis present

## 2016-09-26 DIAGNOSIS — Z800 Family history of malignant neoplasm of digestive organs: Secondary | ICD-10-CM

## 2016-09-26 DIAGNOSIS — J90 Pleural effusion, not elsewhere classified: Secondary | ICD-10-CM

## 2016-09-26 DIAGNOSIS — R69 Illness, unspecified: Secondary | ICD-10-CM | POA: Diagnosis not present

## 2016-09-26 DIAGNOSIS — E860 Dehydration: Secondary | ICD-10-CM | POA: Diagnosis present

## 2016-09-26 DIAGNOSIS — I252 Old myocardial infarction: Secondary | ICD-10-CM

## 2016-09-26 DIAGNOSIS — Z9011 Acquired absence of right breast and nipple: Secondary | ICD-10-CM

## 2016-09-26 DIAGNOSIS — K2970 Gastritis, unspecified, without bleeding: Secondary | ICD-10-CM | POA: Diagnosis not present

## 2016-09-26 DIAGNOSIS — N132 Hydronephrosis with renal and ureteral calculous obstruction: Principal | ICD-10-CM | POA: Diagnosis present

## 2016-09-26 DIAGNOSIS — Z9049 Acquired absence of other specified parts of digestive tract: Secondary | ICD-10-CM

## 2016-09-26 DIAGNOSIS — Z8249 Family history of ischemic heart disease and other diseases of the circulatory system: Secondary | ICD-10-CM

## 2016-09-26 LAB — CBC WITH DIFFERENTIAL/PLATELET
Basophils Absolute: 0 10*3/uL (ref 0.0–0.1)
Basophils Relative: 0 %
EOS ABS: 0 10*3/uL (ref 0.0–0.7)
Eosinophils Relative: 0 %
HCT: 32.6 % — ABNORMAL LOW (ref 36.0–46.0)
HEMOGLOBIN: 10.5 g/dL — AB (ref 12.0–15.0)
LYMPHS ABS: 0.3 10*3/uL — AB (ref 0.7–4.0)
LYMPHS PCT: 3 %
MCH: 26.7 pg (ref 26.0–34.0)
MCHC: 32.2 g/dL (ref 30.0–36.0)
MCV: 83 fL (ref 78.0–100.0)
MONOS PCT: 2 %
Monocytes Absolute: 0.2 10*3/uL (ref 0.1–1.0)
NEUTROS PCT: 95 %
Neutro Abs: 8.8 10*3/uL — ABNORMAL HIGH (ref 1.7–7.7)
Platelets: 103 10*3/uL — ABNORMAL LOW (ref 150–400)
RBC: 3.93 MIL/uL (ref 3.87–5.11)
RDW: 15.3 % (ref 11.5–15.5)
WBC: 9.3 10*3/uL (ref 4.0–10.5)

## 2016-09-26 LAB — COMPREHENSIVE METABOLIC PANEL
ALBUMIN: 3.9 g/dL (ref 3.5–5.0)
ALK PHOS: 34 U/L — AB (ref 38–126)
ALT: 17 U/L (ref 14–54)
AST: 24 U/L (ref 15–41)
Anion gap: 10 (ref 5–15)
BILIRUBIN TOTAL: 0.9 mg/dL (ref 0.3–1.2)
BUN: 18 mg/dL (ref 6–20)
CALCIUM: 9.5 mg/dL (ref 8.9–10.3)
CO2: 24 mmol/L (ref 22–32)
Chloride: 104 mmol/L (ref 101–111)
Creatinine, Ser: 1.23 mg/dL — ABNORMAL HIGH (ref 0.44–1.00)
GFR calc Af Amer: 44 mL/min — ABNORMAL LOW (ref >60)
GFR calc non Af Amer: 38 mL/min — ABNORMAL LOW (ref >60)
GLUCOSE: 150 mg/dL — AB (ref 65–99)
Potassium: 3.6 mmol/L (ref 3.5–5.1)
Sodium: 138 mmol/L (ref 135–145)
TOTAL PROTEIN: 6.8 g/dL (ref 6.5–8.1)

## 2016-09-26 LAB — URINALYSIS, ROUTINE W REFLEX MICROSCOPIC
Bilirubin Urine: NEGATIVE
Glucose, UA: NEGATIVE mg/dL
Ketones, ur: NEGATIVE mg/dL
Nitrite: NEGATIVE
PH: 7 (ref 5.0–8.0)
Protein, ur: NEGATIVE mg/dL
SPECIFIC GRAVITY, URINE: 1.017 (ref 1.005–1.030)
SQUAMOUS EPITHELIAL / LPF: NONE SEEN

## 2016-09-26 LAB — LIPASE, BLOOD: Lipase: 15 U/L (ref 11–51)

## 2016-09-26 LAB — POC OCCULT BLOOD, ED: FECAL OCCULT BLD: NEGATIVE

## 2016-09-26 LAB — TROPONIN I: Troponin I: <0.03 ng/mL (ref <0.03)

## 2016-09-26 MED ORDER — IOPAMIDOL (ISOVUE-300) INJECTION 61%
100.0000 mL | Freq: Once | INTRAVENOUS | Status: AC | PRN
  Administered 2016-09-26: 100 mL via INTRAVENOUS

## 2016-09-26 MED ORDER — HYDROMORPHONE HCL 1 MG/ML IJ SOLN
0.2500 mg | Freq: Once | INTRAMUSCULAR | Status: AC
  Administered 2016-09-26: 0.25 mg via INTRAVENOUS
  Filled 2016-09-26: qty 1

## 2016-09-26 MED ORDER — MORPHINE SULFATE (PF) 2 MG/ML IV SOLN: 2.0000 mg | INTRAVENOUS | Status: DC | PRN

## 2016-09-26 MED ORDER — IPRATROPIUM-ALBUTEROL 0.5-2.5 (3) MG/3ML IN SOLN
3.0000 mL | Freq: Once | RESPIRATORY_TRACT | Status: DC
  Filled 2016-09-26: qty 3

## 2016-09-26 MED ORDER — ONDANSETRON HCL 4 MG/2ML IJ SOLN
4.0000 mg | INTRAMUSCULAR | Status: AC | PRN
  Administered 2016-09-26 (×2): 4 mg via INTRAVENOUS
  Filled 2016-09-26 (×2): qty 2

## 2016-09-26 MED ORDER — SODIUM CHLORIDE 0.9 % IV SOLN
INTRAVENOUS | Status: DC
  Administered 2016-09-26: 21:00:00 via INTRAVENOUS

## 2016-09-26 NOTE — ED Triage Notes (Signed)
Pt reports pain in right lower abd onset this am with vomiting.  Pt states she has not eaten today.  Her blood sugars have been "all over the place"

## 2016-09-26 NOTE — ED Provider Notes (Signed)
[facility] DEPT Provider Note   CSN: [id number] Arrival date & time: 09/26/16  2007     History   Chief Complaint Chief Complaint  Patient presents with  . Abdominal Pain  . Emesis    HPI Misty Edwards is a 83 y.o. female.   Abdominal Pain   Associated symptoms include vomiting.  Emesis   Associated symptoms include abdominal pain.  Pt was seen at 2015. Per pt, c/o gradual onset and persistence of constant lower abd "pain" since this morning. Has been associated with multiple intermittent episodes of N/V. Denies diarrhea, no back pain, no CP/SOB, no fevers.    Past Medical History:  Diagnosis Date  . Acute biliary pancreatitis 07/2002   thia was in 07/2004:she still has pseudocyst in tail of pancreas measuring 54 x 33 mm   . Adrenal adenoma    bilateral  . Anxiety   . Chronic pancreatitis ([place])   . Detached retina   . Diabetes mellitus ([place])   . Diverticulosis   . History of carcinoma in situ of breast 1988  . Hyperlipidemia   . Hypertension   . IBS (irritable bowel syndrome)   . Legally blind   . Osteoarthritis   . Osteopenia   . Upper GI bleed September2004   secondary to gastritis    Patient Active Problem List   Diagnosis Date Noted  . Heme positive stool 09/05/2016  . Dyslipidemia 03/07/2015  . Type 2 diabetes mellitus with vascular disease ([place]) 03/07/2015  . Anemia 09/26/2014  . Lesion of lip 09/25/2014  . S/P CABG x 3 08/25/2014  . NSTEMI (non-ST elevated myocardial infarction) ([place]) 08/24/2014  . Acute coronary syndrome ([place])   . IBS (irritable bowel syndrome) 08/26/2012  . Thrombocytopenia ([place]) 02/02/2012  . Epigastric pain 02/02/2012  . Gastritis 02/02/2012  . Abdominal pain 12/17/2011  . Normocytic anemia 12/17/2011  . History of colon polyps 12/17/2011  . Constipation 12/17/2011  . Family history of colon cancer 08/22/2011  . LLQ pain 08/21/2011  . CHRONIC PANCREATITIS 09/12/2008  . HELICOBACTER PYLORI INFECTION 01/26/2008  .  ADENOCARCINOMA, BREAST 01/26/2008  . Essential hypertension 01/26/2008  . External hemorrhoids 01/26/2008  . GERD 01/26/2008  . CONSTIPATION 01/26/2008  . PSEUDOCYST, PANCREAS 01/26/2008  . GALLSTONE PANCREATITIS 01/26/2008  . GI BLEEDING 01/26/2008  . UTI 01/26/2008  . NAUSEA 01/26/2008  . VOMITING 01/26/2008  . DIARRHEA 01/26/2008    Past Surgical History:  Procedure Laterality Date  . CARDIAC CATHETERIZATION N/A 08/24/2014   Procedure: Left Heart Cath and Coronary Angiography;  Surgeon: Leonie Man, MD;  Location: [facility];  Service: Cardiovascular;  Laterality: N/A;  . COLONOSCOPY  08/15/05   few tiny diverticula at sigmoid colon/external hemorrhoids but no polyps  . COLONOSCOPY  01/08/2012   FMB:[id number] diverticulosis. Next colonoscopy in 12/2016  . CORONARY ARTERY BYPASS GRAFT N/A 08/24/2014   Procedure: CORONARY ARTERY BYPASS GRAFTING (CABG) x three, using left internal mammary artery and right leg greater saphenous vein harvested endoscopically;  Surgeon: Ivin Poot, MD;  Location: [facility];  Service: Open Heart Surgery;  Laterality: N/A;  . ECTOPIC PREGNANCY SURGERY  1950's  . ERCP with sphincterotomy  07/2002  . ESOPHAGOGASTRODUODENOSCOPY      Gastritis of body.  Otherwise normal  . ESOPHAGOGASTRODUODENOSCOPY  01/08/2012   RMR: Few scattered gastric erosions of uncertain significance-status post biopsy. Minimal chronic inflammation, no H.pylori  . LAPAROSCOPIC CHOLECYSTECTOMY    . PANCREATIC PSEUDOCYST DRAINAGE  [id number]   drained percutaneously   .  right mastectomy    . tacking up of her bladder    . TEE WITHOUT CARDIOVERSION N/A 08/24/2014   Procedure: TRANSESOPHAGEAL ECHOCARDIOGRAM (TEE);  Surgeon: Ivin Poot, MD;  Location: [facility];  Service: Open Heart Surgery;  Laterality: N/A;    OB History    Gravida Para Term Preterm AB Living             5   SAB TAB Ectopic Multiple Live Births                   Home Medications    Prior to Admission  medications   Medication Sig Start Date End Date Taking? Authorizing Provider  acetaminophen (TYLENOL) 500 MG tablet Take 500-1,000 mg by mouth 2 (two) times daily as needed. Pain.    [provider]  amLODipine (NORVASC) 5 MG tablet TAKE ONE TABLET BY MOUTH ONCE DAILY 05/05/16   Erlene Quan, PA-C  aspirin EC 81 MG EC tablet Take 1 tablet (81 mg total) by mouth daily. 08/31/14   Gold, Patrick Jupiter E, PA-C  cetirizine (ZYRTEC) 10 MG tablet TAKE ONE TABLET BY MOUTH ONCE DAILY 07/30/16   Susy Frizzle, MD  CREON 24000 units CPEP TAKE TWO CAPSULES BY MOUTH WITH MEALS AND  ONE  CAPSULE  WITH  SNACKS 11/09/15   Susy Frizzle, MD  cycloSPORINE (RESTASIS) 0.05 % ophthalmic emulsion Place 1 drop into both eyes 3 (three) times daily.     [provider]  docusate sodium (COLACE) 100 MG capsule Take 1 capsule (100 mg total) by mouth 2 (two) times daily as needed for mild constipation. 10/19/14   Susy Frizzle, MD  enalapril (VASOTEC) 20 MG tablet TAKE ONE TABLET BY MOUTH ONCE DAILY 02/06/16   Lendon Colonel, NP  feeding supplement, ENSURE ENLIVE, (ENSURE ENLIVE) LIQD Take 237 mLs by mouth 2 (two) times daily between meals. 08/31/14   Gold, Patrick Jupiter E, PA-C  ferrous sulfate 325 (65 FE) MG tablet Take 325 mg by mouth daily with breakfast.    [provider]  FLUoxetine (PROZAC) 20 MG tablet TAKE ONE TABLET BY MOUTH ONCE DAILY 02/06/16   Susy Frizzle, MD  furosemide (LASIX) 20 MG tablet TAKE ONE TABLET BY MOUTH ONCE DAILY 05/19/16   Susy Frizzle, MD  hydrALAZINE (APRESOLINE) 10 MG tablet Two Times Daily As Needed for Systolic BP above 646 Patient taking differently: 10 mg as needed. Two Times Daily As Needed for Systolic BP above 803 21/22/48   Kilroy, Luke K, PA-C  insulin glargine (LANTUS) 100 UNIT/ML injection Inject 0.25 mLs (25 Units total) into the skin every morning. Patient taking differently: Inject 35 Units into the skin every morning.  08/12/16   Susy Frizzle,  MD  Insulin Syringes, Disposable, U-100 1 ML MISC 1/2 inch 31 g 06/22/15   Susy Frizzle, MD  LORazepam (ATIVAN) 0.5 MG tablet TAKE 1 TO 2 TABLETS BY MOUTH ONCE DAILY AT BEDTIME AS NEEDED FOR INSOMNIA 09/18/16   Susy Frizzle, MD  metFORMIN (GLUCOPHAGE-XR) 500 MG 24 hr tablet TAKE ONE TABLET BY MOUTH ONCE DAILY WITH BREAKFAST 07/30/16   Susy Frizzle, MD  metoprolol tartrate (LOPRESSOR) 25 MG tablet TAKE ONE TABLET BY MOUTH TWICE DAILY 05/05/16   Susy Frizzle, MD  Multiple Vitamin (MULTIVITAMIN) capsule Take 1 capsule by mouth daily.    [provider]  omeprazole (PRILOSEC) 40 MG capsule TAKE ONE CAPSULE BY MOUTH ONCE DAILY 07/30/16   Pickard,  Cammie Mcgee, MD  ONE TOUCH ULTRA TEST test strip USE TO TEST UP TO 5 TIMES DAILY AS DIRECTED. 09/02/16   Susy Frizzle, MD  peg 3350 powder (MOVIPREP) 100 g SOLR Take 1 kit (200 g total) by mouth as directed. 09/10/16   Rourk, Cristopher Estimable, MD  potassium chloride (K-DUR) 10 MEQ tablet TAKE ONE TABLET BY MOUTH ONCE DAILY 05/19/16   Susy Frizzle, MD  promethazine (PHENERGAN) 12.5 MG tablet Take 0.5-1 tablets (6.25-12.5 mg total) by mouth every 6 (six) hours as needed for nausea or vomiting. 08/31/14   [place]irtha Rm, Patrick Jupiter E, PA-C  rosuvastatin (CRESTOR) 5 MG tablet TAKE ONE TABLET BY MOUTH ONCE DAILY 05/02/16   Erlene Quan, PA-C  triamcinolone cream (KENALOG) 0.1 % Apply 1 application topically 2 (two) times daily. 07/22/16   Susy Frizzle, MD    Family History Family History  Problem Relation Age of Onset  . Ovarian cancer Mother        passed  . Colon cancer Father 32       passed away in his 59's  . Colon cancer Brother 42       surgery inhis 50's doing well now  . Heart attack Brother        passed away age 68  . Pancreatitis Brother   . Kidney disease Daughter   . Leukemia Son     Social History Social History  Substance Use Topics  . Smoking status: Never Smoker  . Smokeless tobacco: Never Used  . Alcohol use No      Allergies   Calcium-containing compounds; Raloxifene; and Vitamin d analogs   Review of Systems Review of Systems  Gastrointestinal: Positive for abdominal pain and vomiting.  ROS: Statement: All systems negative except as marked or noted in the HPI; Constitutional: Negative for fever and chills. ; ; Eyes: Negative for eye pain, redness and discharge. ; ; ENMT: Negative for ear pain, hoarseness, nasal congestion, sinus pressure and sore throat. ; ; Cardiovascular: Negative for chest pain, palpitations, diaphoresis, dyspnea and peripheral edema. ; ; Respiratory: Negative for cough, wheezing and stridor. ; ; Gastrointestinal: +N/V, abd pain. Negative for diarrhea, blood in stool, hematemesis, jaundice and rectal bleeding. . ; ; Genitourinary: Negative for dysuria, flank pain and hematuria. ; ; Musculoskeletal: Negative for back pain and neck pain. Negative for swelling and trauma.; ; Skin: Negative for pruritus, rash, abrasions, blisters, bruising and skin lesion.; ; Neuro: Negative for headache, lightheadedness and neck stiffness. Negative for weakness, altered level of consciousness, altered mental status, extremity weakness, paresthesias, involuntary movement, seizure and syncope.       Physical Exam Updated Vital Signs BP (!) 157/57 (BP Location: Left Arm)   Temp 98.8 F (37.1 C) (Oral)   Resp 15   Ht '5\' 3"'  (1.6 m)   Wt 63.5 kg (140 lb)   SpO2 96%   BMI 24.80 kg/m    Patient Vitals for the past 24 hrs:  BP Temp Temp src Pulse Resp SpO2 Height Weight  09/26/16 2328 (!) 200/78 98.5 F (36.9 C) Oral (!) 111 (!) 48 95 % - -  09/26/16 2200 (!) 163/60 - - 88 - 95 % - -  09/26/16 2016 - - - - - - '5\' 3"'  (1.6 m) 63.5 kg (140 lb)  09/26/16 2015 (!) 157/57 98.8 F (37.1 C) Oral - 15 96 % - -      Physical Exam 2020: Physical examination:  Nursing notes reviewed; Vital signs and  O2 SAT reviewed;  Constitutional: Well developed, Well nourished, Uncomfortable appearing.; Head:   Normocephalic, atraumatic; Eyes: EOMI, PERRL, No scleral icterus; ENMT: Mouth and pharynx normal, Mucous membranes dry; Neck: Supple, Full range of motion, No lymphadenopathy; Cardiovascular: Regular rate and rhythm, No gallop; Respiratory: Breath sounds coarse & equal bilaterally, No wheezes.  Speaking full sentences with ease, Normal respiratory effort/excursion; Chest: Nontender, Movement normal; Abdomen: Soft, +RLQ > diffuse tenderness to palp. No rebound or guarding. Nondistended, Normal bowel sounds. Rectal exam performed w/permission of pt and ED RN chaperone present.  Anal tone normal.  Non-tender, soft black stool in rectal vault, heme neg.  No fissures, no external hemorrhoids, no palp masses.; Genitourinary: No CVA tenderness; Extremities: Pulses normal, No tenderness, No edema, No calf edema or asymmetry.; Neuro: AA&Ox3, legally blind, otherwise major CN grossly intact.  Speech clear. No gross focal motor or sensory deficits in extremities.; Skin: Color normal, Warm, Dry.   ED Treatments / Results  Labs (all labs ordered are listed, but only abnormal results are displayed)   EKG  EKG Interpretation  Date/Time:  Friday September 26 2016 21:35:30 EDT Ventricular Rate:  76 PR Interval:    QRS Duration: 89 QT Interval:  408 QTC Calculation: 459 R Axis:   21 Text Interpretation:  Normal sinus rhythm Anterior infarct, old Nonspecific T abnormalities, lateral leads Baseline wander Artifact When compared with ECG of 08/25/2014 QT has shortened Confirmed by [facility]  MD, Nunzio Cory [phone]) on 09/26/2016 9:52:31 PM       Radiology    Procedures Procedures (including critical care time)  Medications Ordered in ED Medications  0.9 %  sodium chloride infusion (not administered)  ondansetron (ZOFRAN) injection 4 mg (not administered)  morphine 2 MG/ML injection 2 mg (not administered)     Initial Impression / Assessment and Plan / ED Course  I have reviewed the triage vital signs and the  nursing notes.  Pertinent labs & imaging results that were available during my care of the patient were reviewed by me and considered in my medical decision making (see chart for details).  MDM Reviewed: previous chart, nursing note and vitals Reviewed previous: labs and ECG Interpretation: labs, ECG, x-ray and CT scan    Results for orders placed or performed during the hospital encounter of 09/26/16  Urinalysis, Routine w reflex microscopic  Result Value Ref Range   Color, Urine YELLOW YELLOW   APPearance HAZY (A) CLEAR   Specific Gravity, Urine 1.017 1.005 - 1.030   pH 7.0 5.0 - 8.0   Glucose, UA NEGATIVE NEGATIVE mg/dL   Hgb urine dipstick SMALL (A) NEGATIVE   Bilirubin Urine NEGATIVE NEGATIVE   Ketones, ur NEGATIVE NEGATIVE mg/dL   Protein, ur NEGATIVE NEGATIVE mg/dL   Nitrite NEGATIVE NEGATIVE   Leukocytes, UA TRACE (A) NEGATIVE   RBC / HPF 0-5 0 - 5 RBC/hpf   WBC, UA 6-30 0 - 5 WBC/hpf   Bacteria, UA RARE (A) NONE SEEN   Squamous Epithelial / LPF NONE SEEN NONE SEEN  Comprehensive metabolic panel  Result Value Ref Range   Sodium 138 135 - 145 mmol/L   Potassium 3.6 3.5 - 5.1 mmol/L   Chloride 104 101 - 111 mmol/L   CO2 24 22 - 32 mmol/L   Glucose, Bld 150 (H) 65 - 99 mg/dL   BUN 18 6 - 20 mg/dL   Creatinine, Ser 1.23 (H) 0.44 - 1.00 mg/dL   Calcium 9.5 8.9 - 10.3 mg/dL   Total Protein 6.8 6.5 -  8.1 g/dL   Albumin 3.9 3.5 - 5.0 g/dL   AST 24 15 - 41 U/L   ALT 17 14 - 54 U/L   Alkaline Phosphatase 34 (L) 38 - 126 U/L   Total Bilirubin 0.9 0.3 - 1.2 mg/dL   GFR calc non Af Amer 38 (L) >60 mL/min   GFR calc Af Amer 44 (L) >60 mL/min   Anion gap 10 5 - 15  Lipase, blood  Result Value Ref Range   Lipase 15 11 - 51 U/L  Troponin I  Result Value Ref Range   Troponin I <0.03 <0.03 ng/mL  CBC with Differential  Result Value Ref Range   WBC 9.3 4.0 - 10.5 K/uL   RBC 3.93 3.87 - 5.11 MIL/uL   Hemoglobin 10.5 (L) 12.0 - 15.0 g/dL   HCT 32.6 (L) 36.0 - 46.0 %    MCV 83.0 78.0 - 100.0 fL   MCH 26.7 26.0 - 34.0 pg   MCHC 32.2 30.0 - 36.0 g/dL   RDW 15.3 11.5 - 15.5 %   Platelets 103 (L) 150 - 400 K/uL   Neutrophils Relative % 95 %   Neutro Abs 8.8 (H) 1.7 - 7.7 K/uL   Lymphocytes Relative 3 %   Lymphs Abs 0.3 (L) 0.7 - 4.0 K/uL   Monocytes Relative 2 %   Monocytes Absolute 0.2 0.1 - 1.0 K/uL   Eosinophils Relative 0 %   Eosinophils Absolute 0.0 0.0 - 0.7 K/uL   Basophils Relative 0 %   Basophils Absolute 0.0 0.0 - 0.1 K/uL  POC occult blood, ED  Result Value Ref Range   Fecal Occult Bld NEGATIVE NEGATIVE   Dg Chest 2 View Result Date: 09/26/2016 CLINICAL DATA:  83 year old female with abdominal pain, nausea vomiting. History of breast cancer. EXAM: CHEST  2 VIEW COMPARISON:  Chest radiograph dated 09/27/2014 FINDINGS: There is mild cardiomegaly. There is diffuse interstitial prominence and nodularity which may represent interstitial edema, or lymphangitic spread of tumor on this patient with history of breast cancer. Atypical pneumonia is not excluded. Clinical correlation is recommended. There is no pleural effusion or pneumothorax. Median sternotomy wires and CABG vascular clips. Surgical clips noted over the right side of chest. No acute osseous pathology. IMPRESSION: Diffuse interstitial prominence and nodularity which may represent edema but is concerning for lymphangitic spread of tumor in this patient with history of breast cancer. Pneumonia is not excluded. Clinical correlation is recommended. Electronically Signed   By: Anner Crete M.D.   On: 09/26/2016 23:12   Ct Abdomen Pelvis W Contrast Result Date: 09/26/2016 CLINICAL DATA:  Sharp intermittent pain in the right lower abdomen EXAM: CT ABDOMEN AND PELVIS WITH CONTRAST TECHNIQUE: Multidetector CT imaging of the abdomen and pelvis was performed using the standard protocol following bolus administration of intravenous contrast. CONTRAST:  12m ISOVUE-300 IOPAMIDOL (ISOVUE-300) INJECTION  61% COMPARISON:  02/04/2012 FINDINGS: Lower chest: Small bilateral pleural effusions. Patchy dependent atelectasis. Borderline to mild cardiomegaly. Hepatobiliary: No focal liver abnormality is seen. Status post cholecystectomy. No biliary dilatation. Pancreas: Very atrophic.  No inflammatory changes. Spleen: Normal in size without focal abnormality. Adrenals/Urinary Tract: Adrenal glands are within normal limits. Left kidney shows no hydronephrosis. There is moderate right perinephric fat stranding. Upper pole stones on the right, measuring up to 9 mm. Moderate severe hydronephrosis of the right kidney and proximal right ureter, secondary to diffuse increased density within the proximal right ureter, measuring 4 mm transverse, but over a cranial caudad length of about 2  cm, suspect that these are multiple stones within the proximal right ureter. Bladder unremarkable Stomach/Bowel: The stomach is nonenlarged. No dilated small bowel. Collapsed colon versus mild wall thickening of the right colon and transverse colon. Normal appendix. Vascular/Lymphatic: Aortic atherosclerosis. No enlarged abdominal or pelvic lymph nodes. Reproductive: Numerous calcified fibroids in the uterus. No adnexal masses. Other: No free air or free fluid. Musculoskeletal: Degenerative changes. No acute or suspicious bone lesion. IMPRESSION: 1. Moderate perinephric fat stranding on the right. Moderate to marked right hydronephrosis and proximal hydroureter, secondary to diffuse increased density in the proximal right ureter over a 2 cm craniocaudad length, suspected to represent multiple stones in the proximal right ureter. 2. Negative appendix 3. Marked atrophy of the pancreas 4. Multiple calcified uterine fibroids. 5. Trace bilateral effusions. Electronically Signed   By: Donavan Foil M.D.   On: 09/26/2016 23:23    0002:  Family initially refusing to allow pt to have any pain meds, then questioning why pt was in pain with BP elevated.  Long d/w pt's family regarding giving pt small dose of pain meds to improve her pain, which is causing elevation of her BP. Family argumentative regarding same, including wanting only PO meds; ED RN and I explained pt is NPO given N/V and CT findings, and encouraged pt to have pain meds given to her. After several discussions, pt's family finally agreeable to small dose of IV pain meds. IV NS @ 100 ml/hr ordered (no bolus), as well as IV rocephin after UC obtained. T/C to [facility] Marin Comment, case discussed, including:  HPI, pertinent PM/SHx, VS/PE, dx testing, ED course and treatment:  Agreeable to admit, pt likely will be transfer to [facility] and he is aware I am awaiting Uro MD call back.    28:  [facility] Dr[facility] Marin Comment has evaluated pt in the ED: he is aware Uro MD has not called back after paged x3, he will transfer pt to [facility] stepdown unit/Dr. Blaine Hamper.      Final Clinical Impressions(s) / ED Diagnoses   Final diagnoses:  None    New Prescriptions New Prescriptions   No medications on file      Francine Graven, DO 10/01/16 3403

## 2016-09-27 DIAGNOSIS — N132 Hydronephrosis with renal and ureteral calculous obstruction: Secondary | ICD-10-CM | POA: Diagnosis present

## 2016-09-27 DIAGNOSIS — I252 Old myocardial infarction: Secondary | ICD-10-CM | POA: Diagnosis not present

## 2016-09-27 DIAGNOSIS — Z951 Presence of aortocoronary bypass graft: Secondary | ICD-10-CM | POA: Diagnosis not present

## 2016-09-27 DIAGNOSIS — K861 Other chronic pancreatitis: Secondary | ICD-10-CM | POA: Diagnosis not present

## 2016-09-27 DIAGNOSIS — N1330 Unspecified hydronephrosis: Secondary | ICD-10-CM | POA: Diagnosis present

## 2016-09-27 DIAGNOSIS — D649 Anemia, unspecified: Secondary | ICD-10-CM | POA: Diagnosis not present

## 2016-09-27 DIAGNOSIS — I10 Essential (primary) hypertension: Secondary | ICD-10-CM | POA: Diagnosis not present

## 2016-09-27 DIAGNOSIS — Z87442 Personal history of urinary calculi: Secondary | ICD-10-CM | POA: Diagnosis not present

## 2016-09-27 DIAGNOSIS — K589 Irritable bowel syndrome without diarrhea: Secondary | ICD-10-CM | POA: Diagnosis not present

## 2016-09-27 DIAGNOSIS — J90 Pleural effusion, not elsewhere classified: Secondary | ICD-10-CM | POA: Diagnosis not present

## 2016-09-27 DIAGNOSIS — Z79899 Other long term (current) drug therapy: Secondary | ICD-10-CM | POA: Diagnosis not present

## 2016-09-27 DIAGNOSIS — E860 Dehydration: Secondary | ICD-10-CM | POA: Diagnosis not present

## 2016-09-27 DIAGNOSIS — Z87440 Personal history of urinary (tract) infections: Secondary | ICD-10-CM | POA: Diagnosis not present

## 2016-09-27 DIAGNOSIS — K219 Gastro-esophageal reflux disease without esophagitis: Secondary | ICD-10-CM | POA: Diagnosis not present

## 2016-09-27 DIAGNOSIS — F419 Anxiety disorder, unspecified: Secondary | ICD-10-CM | POA: Diagnosis not present

## 2016-09-27 DIAGNOSIS — N200 Calculus of kidney: Secondary | ICD-10-CM

## 2016-09-27 DIAGNOSIS — N179 Acute kidney failure, unspecified: Secondary | ICD-10-CM | POA: Diagnosis not present

## 2016-09-27 DIAGNOSIS — H548 Legal blindness, as defined in USA: Secondary | ICD-10-CM | POA: Diagnosis not present

## 2016-09-27 DIAGNOSIS — Z7982 Long term (current) use of aspirin: Secondary | ICD-10-CM | POA: Diagnosis not present

## 2016-09-27 DIAGNOSIS — I959 Hypotension, unspecified: Secondary | ICD-10-CM | POA: Diagnosis not present

## 2016-09-27 DIAGNOSIS — Z794 Long term (current) use of insulin: Secondary | ICD-10-CM | POA: Diagnosis not present

## 2016-09-27 DIAGNOSIS — E785 Hyperlipidemia, unspecified: Secondary | ICD-10-CM | POA: Diagnosis not present

## 2016-09-27 DIAGNOSIS — Z841 Family history of disorders of kidney and ureter: Secondary | ICD-10-CM | POA: Diagnosis not present

## 2016-09-27 DIAGNOSIS — R69 Illness, unspecified: Secondary | ICD-10-CM | POA: Diagnosis not present

## 2016-09-27 DIAGNOSIS — E1151 Type 2 diabetes mellitus with diabetic peripheral angiopathy without gangrene: Secondary | ICD-10-CM | POA: Diagnosis not present

## 2016-09-27 DIAGNOSIS — I2510 Atherosclerotic heart disease of native coronary artery without angina pectoris: Secondary | ICD-10-CM | POA: Diagnosis not present

## 2016-09-27 DIAGNOSIS — Z9011 Acquired absence of right breast and nipple: Secondary | ICD-10-CM | POA: Diagnosis not present

## 2016-09-27 DIAGNOSIS — Z853 Personal history of malignant neoplasm of breast: Secondary | ICD-10-CM | POA: Diagnosis not present

## 2016-09-27 LAB — GLUCOSE, CAPILLARY
GLUCOSE-CAPILLARY: 154 mg/dL — AB (ref 65–99)
GLUCOSE-CAPILLARY: 304 mg/dL — AB (ref 65–99)
Glucose-Capillary: 165 mg/dL — ABNORMAL HIGH (ref 65–99)
Glucose-Capillary: 342 mg/dL — ABNORMAL HIGH (ref 65–99)

## 2016-09-27 LAB — BASIC METABOLIC PANEL
ANION GAP: 11 (ref 5–15)
BUN: 24 mg/dL — ABNORMAL HIGH (ref 6–20)
CALCIUM: 8.8 mg/dL — AB (ref 8.9–10.3)
CO2: 24 mmol/L (ref 22–32)
Chloride: 103 mmol/L (ref 101–111)
Creatinine, Ser: 1.73 mg/dL — ABNORMAL HIGH (ref 0.44–1.00)
GFR, EST AFRICAN AMERICAN: 29 mL/min — AB (ref >60)
GFR, EST NON AFRICAN AMERICAN: 25 mL/min — AB (ref >60)
Glucose, Bld: 168 mg/dL — ABNORMAL HIGH (ref 65–99)
Potassium: 3.3 mmol/L — ABNORMAL LOW (ref 3.5–5.1)
SODIUM: 138 mmol/L (ref 135–145)

## 2016-09-27 LAB — CBC
HEMATOCRIT: 32.5 % — AB (ref 36.0–46.0)
Hemoglobin: 10.3 g/dL — ABNORMAL LOW (ref 12.0–15.0)
MCH: 26.5 pg (ref 26.0–34.0)
MCHC: 31.7 g/dL (ref 30.0–36.0)
MCV: 83.8 fL (ref 78.0–100.0)
PLATELETS: 76 10*3/uL — AB (ref 150–400)
RBC: 3.88 MIL/uL (ref 3.87–5.11)
RDW: 15.8 % — ABNORMAL HIGH (ref 11.5–15.5)
WBC: 16 10*3/uL — AB (ref 4.0–10.5)

## 2016-09-27 LAB — MRSA PCR SCREENING: MRSA BY PCR: NEGATIVE

## 2016-09-27 LAB — TSH: TSH: 0.188 u[IU]/mL — ABNORMAL LOW (ref 0.350–4.500)

## 2016-09-27 LAB — BRAIN NATRIURETIC PEPTIDE: B NATRIURETIC PEPTIDE 5: 1196 pg/mL — AB (ref 0.0–100.0)

## 2016-09-27 MED ORDER — SODIUM CHLORIDE 0.9 % IV BOLUS (SEPSIS)
250.0000 mL | Freq: Once | INTRAVENOUS | Status: AC
  Administered 2016-09-27: 250 mL via INTRAVENOUS

## 2016-09-27 MED ORDER — METOPROLOL TARTRATE 25 MG PO TABS
25.0000 mg | ORAL_TABLET | Freq: Two times a day (BID) | ORAL | Status: DC
  Filled 2016-09-27 (×2): qty 1

## 2016-09-27 MED ORDER — FLUOXETINE HCL 20 MG PO CAPS
20.0000 mg | ORAL_CAPSULE | Freq: Every day | ORAL | Status: DC
  Administered 2016-09-27 – 2016-09-28 (×2): 20 mg via ORAL
  Filled 2016-09-27 (×2): qty 1

## 2016-09-27 MED ORDER — HYDROMORPHONE HCL 1 MG/ML IJ SOLN: 0.2500 mg | INTRAMUSCULAR | Status: DC | PRN

## 2016-09-27 MED ORDER — OXYCODONE-ACETAMINOPHEN 5-325 MG PO TABS
1.0000 | ORAL_TABLET | ORAL | Status: DC | PRN
  Administered 2016-09-27: 1 via ORAL
  Administered 2016-09-27: 2 via ORAL
  Administered 2016-09-28: 1 via ORAL
  Administered 2016-09-28: 2 via ORAL
  Administered 2016-09-28: 1 via ORAL
  Filled 2016-09-27 (×2): qty 1
  Filled 2016-09-27 (×2): qty 2
  Filled 2016-09-27: qty 1

## 2016-09-27 MED ORDER — AMLODIPINE BESYLATE 5 MG PO TABS
5.0000 mg | ORAL_TABLET | Freq: Every day | ORAL | Status: DC
  Administered 2016-09-27: 5 mg via ORAL
  Filled 2016-09-27: qty 1

## 2016-09-27 MED ORDER — SODIUM CHLORIDE 0.9 % IV BOLUS (SEPSIS): 250.0000 mL | Freq: Once | INTRAVENOUS | Status: DC

## 2016-09-27 MED ORDER — HYDROMORPHONE HCL 2 MG/ML IJ SOLN
INTRAMUSCULAR | Status: AC
  Administered 2016-09-27: 0.25 mg
  Filled 2016-09-27: qty 1

## 2016-09-27 MED ORDER — HYDROMORPHONE HCL-NACL 0.5-0.9 MG/ML-% IV SOSY
0.2500 mg | PREFILLED_SYRINGE | INTRAVENOUS | Status: DC | PRN
  Filled 2016-09-27: qty 1

## 2016-09-27 MED ORDER — ROSUVASTATIN CALCIUM 5 MG PO TABS
5.0000 mg | ORAL_TABLET | Freq: Every day | ORAL | Status: DC
  Administered 2016-09-27 – 2016-09-28 (×2): 5 mg via ORAL
  Filled 2016-09-27 (×2): qty 1

## 2016-09-27 MED ORDER — ONDANSETRON HCL 4 MG/2ML IJ SOLN
4.0000 mg | Freq: Four times a day (QID) | INTRAMUSCULAR | Status: DC | PRN
  Administered 2016-09-27: 4 mg via INTRAVENOUS
  Filled 2016-09-27: qty 2

## 2016-09-27 MED ORDER — HEPARIN SODIUM (PORCINE) 5000 UNIT/ML IJ SOLN
5000.0000 [IU] | Freq: Three times a day (TID) | INTRAMUSCULAR | Status: DC
  Administered 2016-09-27 – 2016-09-28 (×5): 5000 [IU] via SUBCUTANEOUS
  Filled 2016-09-27 (×5): qty 1

## 2016-09-27 MED ORDER — FUROSEMIDE 10 MG/ML IJ SOLN
INTRAMUSCULAR | Status: AC
  Administered 2016-09-27: 60 mg via INTRAVENOUS
  Filled 2016-09-27: qty 6

## 2016-09-27 MED ORDER — DEXTROSE-NACL 5-0.9 % IV SOLN
INTRAVENOUS | Status: DC
  Administered 2016-09-27: 06:00:00 via INTRAVENOUS

## 2016-09-27 MED ORDER — CHLORHEXIDINE GLUCONATE 0.12 % MT SOLN
15.0000 mL | Freq: Two times a day (BID) | OROMUCOSAL | Status: DC
  Administered 2016-09-27 – 2016-09-28 (×3): 15 mL via OROMUCOSAL
  Filled 2016-09-27 (×3): qty 15

## 2016-09-27 MED ORDER — FUROSEMIDE 10 MG/ML IJ SOLN
60.0000 mg | Freq: Once | INTRAMUSCULAR | Status: AC
  Administered 2016-09-27: 60 mg via INTRAVENOUS

## 2016-09-27 MED ORDER — PANCRELIPASE (LIP-PROT-AMYL) 12000-38000 UNITS PO CPEP
24000.0000 [IU] | ORAL_CAPSULE | Freq: Three times a day (TID) | ORAL | Status: DC
  Administered 2016-09-27 – 2016-09-28 (×5): 24000 [IU] via ORAL
  Filled 2016-09-27 (×5): qty 2

## 2016-09-27 MED ORDER — ONDANSETRON HCL 4 MG PO TABS: 4.0000 mg | ORAL_TABLET | Freq: Four times a day (QID) | ORAL | Status: DC | PRN

## 2016-09-27 MED ORDER — INSULIN ASPART 100 UNIT/ML ~~LOC~~ SOLN
0.0000 [IU] | SUBCUTANEOUS | Status: DC
  Administered 2016-09-27: 7 [IU] via SUBCUTANEOUS
  Administered 2016-09-27: 2 [IU] via SUBCUTANEOUS
  Administered 2016-09-27: 7 [IU] via SUBCUTANEOUS
  Administered 2016-09-27: 2 [IU] via SUBCUTANEOUS
  Administered 2016-09-28 (×2): 7 [IU] via SUBCUTANEOUS
  Administered 2016-09-28: 5 [IU] via SUBCUTANEOUS
  Administered 2016-09-28: 7 [IU] via SUBCUTANEOUS

## 2016-09-27 MED ORDER — TAMSULOSIN HCL 0.4 MG PO CAPS
0.4000 mg | ORAL_CAPSULE | Freq: Every day | ORAL | Status: DC
  Administered 2016-09-27 – 2016-09-28 (×2): 0.4 mg via ORAL
  Filled 2016-09-27 (×2): qty 1

## 2016-09-27 MED ORDER — ORAL CARE MOUTH RINSE
15.0000 mL | Freq: Two times a day (BID) | OROMUCOSAL | Status: DC
  Administered 2016-09-27 – 2016-09-28 (×2): 15 mL via OROMUCOSAL

## 2016-09-27 MED ORDER — SODIUM CHLORIDE 0.9 % IV SOLN
INTRAVENOUS | Status: DC
  Administered 2016-09-27: 09:00:00 via INTRAVENOUS

## 2016-09-27 MED ORDER — CEFTRIAXONE SODIUM 1 G IJ SOLR
1.0000 g | INTRAMUSCULAR | Status: DC
  Administered 2016-09-28: 1 g via INTRAVENOUS
  Filled 2016-09-27 (×2): qty 10

## 2016-09-27 MED ORDER — CEFTRIAXONE SODIUM 1 G IJ SOLR
1.0000 g | Freq: Once | INTRAMUSCULAR | Status: AC
  Administered 2016-09-27: 1 g via INTRAVENOUS
  Filled 2016-09-27: qty 10

## 2016-09-27 MED ORDER — ASPIRIN EC 81 MG PO TBEC
81.0000 mg | DELAYED_RELEASE_TABLET | Freq: Every morning | ORAL | Status: DC
  Administered 2016-09-27 – 2016-09-28 (×2): 81 mg via ORAL
  Filled 2016-09-27 (×2): qty 1

## 2016-09-27 NOTE — H&P (Signed)
History and Physical    Misty Edwards AJG:[id number] DOB: 08/01/1926 DOA: 09/26/2016  PCP: Susy Frizzle, MD  Patient coming from: Home.    Chief Complaint:  Right flank pain, acute, severe.   HPI: Misty Edwards is an 83 y.o. female with hx of chronic pancreatitis, HLD, legally blind, hx of breast CA in situ,  CAD s/p MI with CABG 2 years ago, DM, IBS, HTN, lives alone at home, presented to the ER with one day hx of acute right flank pain with nausea.  She has no fever, chills, CP, or SOB.  CT of the abd showed right ureteral hydronephrosis likely due to multiple stones.  She has some perinephric stranding.   She has no leukocytosis, and Cr of 1.2.  She was given 1 L of NS in the ER, and began to have wheezing and responded with 61m of IV Lasix.  CXR showed vascular congestion vs lymphagitic spread of tumor given hx of breast CA.  ED Course:  See above.  Rewiew of Systems:  Constitutional: Negative for malaise, fever and chills. No significant weight loss or weight gain Eyes: Negative for eye pain, redness and discharge, diplopia, visual changes, or flashes of light. ENMT: Negative for ear pain, hoarseness, nasal congestion, sinus pressure and sore throat. No headaches; tinnitus, drooling, or problem swallowing. Cardiovascular: Negative for chest pain, palpitations, diaphoresis, dyspnea and peripheral edema. ; No orthopnea, PND Respiratory: Negative for cough, hemoptysis, wheezing and stridor. No pleuritic chestpain. Gastrointestinal: Negative for diarrhea, constipation,  melena, blood in stool, hematemesis, jaundice and rectal bleeding.    Genitourinary: Negative for frequency, dysuria, incontinence,flank pain and hematuria; Musculoskeletal: Negative for back pain and neck pain. Negative for swelling and trauma.;  Skin: . Negative for pruritus, rash, abrasions, bruising and skin lesion.; ulcerations Neuro: Negative for headache, lightheadedness and neck stiffness. Negative for  weakness, altered level of consciousness , altered mental status, extremity weakness, burning feet, involuntary movement, seizure and syncope.  Psych: negative for anxiety, depression, insomnia, tearfulness, panic attacks, hallucinations, paranoia, suicidal or homicidal ideation    Past Medical History:  Diagnosis Date  . Acute biliary pancreatitis 07/2002   thia was in 07/2004:she still has pseudocyst in tail of pancreas measuring 54 x 33 mm   . Adrenal adenoma    bilateral  . Anxiety   . [place]itis [place] [place]   . Detached retina   . Diabetes mellitus ([facility]   . Diverticulosis   . History of carcinoma in situ of breast 1988  . Hyperlipidemia   . Hypertension   . IBS (irritable bowel syndrome)   . Legally blind   . Osteoarthritis   . Osteopenia   . Upper GI bleed September2004   secondary to gastritis   Past Surgical History:  Procedure Laterality Date  . CARDIAC CATHETERIZATION N/A 08/24/2014   Procedure: Left Heart Cath and Coronary Angiography;  Surgeon: DLeonie Man MD;  [facility];  Service: Cardiovascular;  Laterality: N/A;  . COLONOSCOPY  08/15/05   few tiny diverticula at sigmoid colon/external hemorrhoids but no polyps  . COLONOSCOPY  01/08/2012   RBTD:HRCBULAdiverticulosis. Next colonoscopy in 12/2016  . CORONARY ARTERY BYPASS GRAFT N/A 08/24/2014   Procedure: CORONARY ARTERY BYPASS GRAFTING (CABG) x three, using left internal mammary artery and right leg greater saphenous vein harvested endoscopically;  Surgeon: PIvin Poot MD;  [facility]  Service: Open Heart Surgery;  Laterality: N/A;  . ECTOPIC PREGNANCY SURGERY  1950's  . ERCP  with sphincterotomy  07/2002  . ESOPHAGOGASTRODUODENOSCOPY      Gastritis of body.  Otherwise normal  . ESOPHAGOGASTRODUODENOSCOPY  01/08/2012   RMR: Few scattered gastric erosions of uncertain significance-status post biopsy. Minimal chronic inflammation, no H.pylori  . LAPAROSCOPIC CHOLECYSTECTOMY    .  PANCREATIC PSEUDOCYST DRAINAGE  [id number]   drained percutaneously   . right mastectomy    . tacking up of her bladder    . TEE WITHOUT CARDIOVERSION N/A 08/24/2014   Procedure: TRANSESOPHAGEAL ECHOCARDIOGRAM (TEE);  Surgeon: Ivin Poot, MD;  Location: [facility];  Service: Open Heart Surgery;  Laterality: N/A;     reports that she has never smoked. She has never used smokeless tobacco. She reports that she does not drink alcohol or use drugs.  Allergies  Allergen Reactions  . Calcium-Containing Compounds Nausea Only  . Morphine And Related     Hallucination  . Raloxifene Itching    Face and eyes burning  . Vitamin D Analogs Nausea Only    Family History  Problem Relation Age of Onset  . Ovarian cancer Mother        passed  . Colon cancer Father 13       passed away in his 59's  . Colon cancer Brother 27       surgery inhis 50's doing well now  . Heart attack Brother        passed away age 64  . Pancreatitis Brother   . Kidney disease Daughter   . Leukemia Son      Prior to Admission medications   Medication Sig Start Date End Date Taking? Authorizing Provider  acetaminophen (TYLENOL) 500 MG tablet Take 500-1,000 mg by mouth 2 (two) times daily as needed. Pain.   Yes [provider]  amLODipine (NORVASC) 5 MG tablet TAKE ONE TABLET BY MOUTH ONCE DAILY 05/05/16  Yes Erlene Quan, PA-C  aspirin EC 81 MG EC tablet Take 1 tablet (81 mg total) by mouth daily. Patient taking differently: Take 81 mg by mouth every morning.  08/31/14  Yes Gold, Wayne E, PA-C  cetirizine (ZYRTEC) 10 MG tablet TAKE ONE TABLET BY MOUTH ONCE DAILY 07/30/16  Yes Susy Frizzle, MD  CREON 24000 units CPEP TAKE TWO CAPSULES BY MOUTH WITH MEALS AND  ONE  CAPSULE  WITH  SNACKS 11/09/15  Yes Susy Frizzle, MD  cycloSPORINE (RESTASIS) 0.05 % ophthalmic emulsion Place 1 drop into both eyes daily as needed.    Yes [provider]  docusate sodium (COLACE) 100 MG capsule Take 1 capsule (100  mg total) by mouth 2 (two) times daily as needed for mild constipation. 10/19/14  Yes Susy Frizzle, MD  enalapril (VASOTEC) 20 MG tablet TAKE ONE TABLET BY MOUTH ONCE DAILY 02/06/16  Yes Lendon Colonel, NP  ferrous sulfate 325 (65 FE) MG tablet Take 325 mg by mouth daily with breakfast.   Yes [provider]  FLUoxetine (PROZAC) 20 MG tablet TAKE ONE TABLET BY MOUTH ONCE DAILY Patient taking differently: TAKE ONE TABLET BY MOUTH ONCE DAILY AT BEDTIME 02/06/16  Yes Susy Frizzle, MD  furosemide (LASIX) 20 MG tablet TAKE ONE TABLET BY MOUTH ONCE DAILY Patient taking differently: TAKE ONE TABLET BY MOUTH ONCE DAILY AS NEEDED FOR FLUID 05/19/16  Yes Susy Frizzle, MD  hydrALAZINE (APRESOLINE) 10 MG tablet Two Times Daily As Needed for Systolic BP above 132 Patient taking differently: 10 mg as needed. Two Times Daily As Needed for Systolic  BP above 160 03/07/15  Yes Kilroy, Luke K, PA-C  insulin glargine (LANTUS) 100 UNIT/ML injection Inject 0.25 mLs (25 Units total) into the skin every morning. Patient taking differently: Inject 35 Units into the skin every morning.  08/12/16  Yes Pickard, Cammie Mcgee, MD  LORazepam (ATIVAN) 0.5 MG tablet TAKE 1 TO 2 TABLETS BY MOUTH ONCE DAILY AT BEDTIME AS NEEDED FOR INSOMNIA Patient taking differently: TAKE  2 TABLETS BY MOUTH ONCE DAILY AT BEDTIME AS NEEDED FOR INSOMNIA 09/18/16  Yes Susy Frizzle, MD  metFORMIN (GLUCOPHAGE-XR) 500 MG 24 hr tablet TAKE ONE TABLET BY MOUTH ONCE DAILY WITH BREAKFAST 07/30/16  Yes Susy Frizzle, MD  metoprolol tartrate (LOPRESSOR) 25 MG tablet TAKE ONE TABLET BY MOUTH TWICE DAILY 05/05/16  Yes Susy Frizzle, MD  Multiple Vitamin (MULTIVITAMIN) capsule Take 1 capsule by mouth daily.   Yes [provider]  omeprazole (PRILOSEC) 40 MG capsule TAKE ONE CAPSULE BY MOUTH ONCE DAILY 07/30/16  Yes Susy Frizzle, MD  promethazine (PHENERGAN) 12.5 MG tablet Take 0.5-1 tablets (6.25-12.5 mg total) by mouth  every 6 (six) hours as needed for nausea or vomiting. 08/31/14  Yes Gold, Wayne E, PA-C  rosuvastatin (CRESTOR) 5 MG tablet TAKE ONE TABLET BY MOUTH ONCE DAILY Patient taking differently: TAKE ONE TABLET BY MOUTH ONCE DAILY AT BEDTIME 05/02/16  Yes Kilroy, Luke K, PA-C  triamcinolone cream (KENALOG) 0.1 % Apply 1 application topically 2 (two) times daily. 07/22/16  Yes Susy Frizzle, MD  Insulin Syringes, Disposable, U-100 1 ML MISC 1/2 inch 31 g 06/22/15   Susy Frizzle, MD  ONE TOUCH ULTRA TEST test strip USE TO TEST UP TO 5 TIMES DAILY AS DIRECTED. 09/02/16   Susy Frizzle, MD  peg 3350 powder (MOVIPREP) 100 g SOLR Take 1 kit (200 g total) by mouth as directed. 09/10/16   Rourk, Cristopher Estimable, MD  potassium chloride (K-DUR) 10 MEQ tablet TAKE ONE TABLET BY MOUTH ONCE DAILY 05/19/16   Susy Frizzle, MD    Physical Exam: Vitals:   09/26/16 2016 09/26/16 2200 09/26/16 2326 09/26/16 2328  BP:  (!) 163/60  (!) 200/78  Pulse:  88 (!) 107 (!) 111  Resp:   (!) 44 (!) 48  Temp:    98.5 F (36.9 C)  TempSrc:    Oral  SpO2:  95% 93% 95%  Weight: 63.5 kg (140 lb)     Height: _0  (1.6 m)         Constitutional: NAD, calm, comfortable Vitals:   09/26/16 2016 09/26/16 2200 09/26/16 2326 09/26/16 2328  BP:  (!) 163/60  (!) 200/78  Pulse:  88 (!) 107 (!) 111  Resp:   (!) 44 (!) 48  Temp:    98.5 F (36.9 C)  TempSrc:    Oral  SpO2:  95% 93% 95%  Weight: 63.5 kg (140 lb)     Height: _1  (1.6 m)      Eyes: PERRL, lids and conjunctivae normal ENMT: Mucous membranes are moist. Posterior pharynx clear of any exudate or lesions.Normal dentition.  Neck: normal, supple, no masses, no thyromegaly Respiratory: clear to auscultation bilaterally, no wheezing, no crackles. Normal respiratory effort. No accessory muscle use.  Cardiovascular: Regular rate and rhythm, no murmurs / rubs / gallops. No extremity edema. 2+ pedal pulses. No carotid bruits.  Abdomen: no tenderness, no masses palpated.  No hepatosplenomegaly. Bowel sounds positive.  Musculoskeletal: no clubbing / cyanosis. No joint deformity upper  and lower extremities. Good ROM, no contractures. Normal muscle tone.  Skin: no rashes, lesions, ulcers. No induration Neurologic: CN 2-12 grossly intact. Sensation intact, DTR normal. Strength 5/5 in all 4.  Psychiatric: Normal judgment and insight. Alert and oriented x 3. Normal mood.   Labs on Admission: I have personally reviewed following labs and imaging studies  CBC:  Recent Labs Lab 09/26/16 2031  WBC 9.3  NEUTROABS 8.8*  HGB 10.5*  HCT 32.6*  MCV 83.0  PLT 376*   Basic Metabolic Panel:  Recent Labs Lab 09/26/16 2031  NA 138  K 3.6  CL 104  CO2 24  GLUCOSE 150*  BUN 18  CREATININE 1.23*  CALCIUM 9.5   GFR: Estimated Creatinine Clearance: 28.9 mL/min (A) (by C-G formula based on SCr of 1.23 mg/dL (H)). Liver Function Tests:  Recent Labs Lab 09/26/16 2031  AST 24  ALT 17  ALKPHOS 34*  BILITOT 0.9  PROT 6.8  ALBUMIN 3.9    Recent Labs Lab 09/26/16 2031  LIPASE 15   Cardiac Enzymes:  Recent Labs Lab 09/26/16 2031  TROPONINI <0.03   Urine analysis:    Component Value Date/Time   COLORURINE YELLOW 09/26/2016 2300   APPEARANCEUR HAZY (A) 09/26/2016 2300   LABSPEC 1.017 09/26/2016 2300   PHURINE 7.0 09/26/2016 2300   GLUCOSEU NEGATIVE 09/26/2016 2300   HGBUR SMALL (A) 09/26/2016 2300   BILIRUBINUR NEGATIVE 09/26/2016 2300   KETONESUR NEGATIVE 09/26/2016 2300   PROTEINUR NEGATIVE 09/26/2016 2300   UROBILINOGEN 0.2 09/02/2014 1445   NITRITE NEGATIVE 09/26/2016 2300   LEUKOCYTESUR TRACE (A) 09/26/2016 2300    Radiological Exams on Admission: Dg Chest 2 View  Result Date: 09/26/2016 CLINICAL DATA:  83 year old female with abdominal pain, nausea vomiting. History of breast cancer. EXAM: CHEST  2 VIEW COMPARISON:  Chest radiograph dated 09/27/2014 FINDINGS: There is mild cardiomegaly. There is diffuse interstitial prominence and  nodularity which may represent interstitial edema, or lymphangitic spread of tumor on this patient with history of breast cancer. Atypical pneumonia is not excluded. Clinical correlation is recommended. There is no pleural effusion or pneumothorax. Median sternotomy wires and CABG vascular clips. Surgical clips noted over the right side of chest. No acute osseous pathology. IMPRESSION: Diffuse interstitial prominence and nodularity which may represent edema but is concerning for lymphangitic spread of tumor in this patient with history of breast cancer. Pneumonia is not excluded. Clinical correlation is recommended. Electronically Signed   By: Anner Crete M.D.   On: 09/26/2016 23:12   Ct Abdomen Pelvis W Contrast  Result Date: 09/26/2016 CLINICAL DATA:  Sharp intermittent pain in the right lower abdomen EXAM: CT ABDOMEN AND PELVIS WITH CONTRAST TECHNIQUE: Multidetector CT imaging of the abdomen and pelvis was performed using the standard protocol following bolus administration of intravenous contrast. CONTRAST:  198m ISOVUE-300 IOPAMIDOL (ISOVUE-300) INJECTION 61% COMPARISON:  02/04/2012 FINDINGS: Lower chest: Small bilateral pleural effusions. Patchy dependent atelectasis. Borderline to mild cardiomegaly. Hepatobiliary: No focal liver abnormality is seen. Status post cholecystectomy. No biliary dilatation. Pancreas: Very atrophic.  No inflammatory changes. Spleen: Normal in size without focal abnormality. Adrenals/Urinary Tract: Adrenal glands are within normal limits. Left kidney shows no hydronephrosis. There is moderate right perinephric fat stranding. Upper pole stones on the right, measuring up to 9 mm. Moderate severe hydronephrosis of the right kidney and proximal right ureter, secondary to diffuse increased density within the proximal right ureter, measuring 4 mm transverse, but over a cranial caudad length of about 2 cm, suspect that  these are multiple stones within the proximal right ureter.  Bladder unremarkable Stomach/Bowel: The stomach is nonenlarged. No dilated small bowel. Collapsed colon versus mild wall thickening of the right colon and transverse colon. Normal appendix. Vascular/Lymphatic: Aortic atherosclerosis. No enlarged abdominal or pelvic lymph nodes. Reproductive: Numerous calcified fibroids in the uterus. No adnexal masses. Other: No free air or free fluid. Musculoskeletal: Degenerative changes. No acute or suspicious bone lesion. IMPRESSION: 1. Moderate perinephric fat stranding on the right. Moderate to marked right hydronephrosis and proximal hydroureter, secondary to diffuse increased density in the proximal right ureter over a 2 cm craniocaudad length, suspected to represent multiple stones in the proximal right ureter. 2. Negative appendix 3. Marked atrophy of the pancreas 4. Multiple calcified uterine fibroids. 5. Trace bilateral effusions. Electronically Signed   By: Donavan Foil M.D.   On: 09/26/2016 23:23    EKG: Independently reviewed.  Assessment/Plan Principal Problem:   Nephrolithiasis Active Problems:   S/P CABG x 3   Type 2 diabetes mellitus with vascular disease (HCC)   Hydronephrosis with renal and ureteral calculus obstruction   Hydronephrosis    PLAN:   Nephrolithiasis with hydronephrosis on the right side:  Dr Thurnell Garbe has tried several times, but unable to reach Dr Loel Lofty tonight.  She has several severe comorbidities, and given her advanced age, I would like to transfer her to the SDU at [facility].  Will give IV antibiotics.  Be very careful with IVF, as she has wheezing with just one liter in the ER, requiring IV Lasix.  Will give very small amount of narcotic.  She tolerated 0.83m Dilaudid well in the ER.  Will hold all her meds.  Please consult urology again in the morning, as we were not able to reach Dr OLoel Loftytonight.  She appears stable and alert.  I told her daughter that she is very fragile, and that she has very little reserve.  Will  give SQ Heparin for prophylaxis and made NPO, as she likely will need a ureteral stent.  She is a full code and this was confirmed tonight.   HTN:  Will give Norvasc. Hold ACE I.  DM:  Will hold Metformin, and give sensitive insulin.  CHF:  See above.  She is not able to tolerate much IVF at all.   DVT prophylaxis: SubQ heparin.  Code Status: FULL CODE.  Family Communication: daughter at bedside.  Disposition Plan:  TBD.  Consults called:  Urology (unable).  Admission status: Inpatient.    Elly Haffey MD FACP. [facility]  If 7PM-7AM, please contact night-coverage [email or web address] Password [facility] 09/27/2016, 12:46 AM

## 2016-09-27 NOTE — Progress Notes (Signed)
[facility]   Patient seen by urology, recommending OP eval with pain control and flomax.   Patient doing ok, although BP is soft, given increase in WBC and low BP will observe overnight.  Will continue patient on pain control medication, Flomax and strain all urine.  Continue ABX for now and await for urine cultures.  If BP goes below 90/60 will bolus 250 NS.  If patient remains stable will d/c in AM.    Chipper Oman, MD

## 2016-09-27 NOTE — Consult Note (Signed)
Urology Consult  CC: Referring physician: Dr. Patrecia Pour Reason for referral:   Impression/Assessment: Right ureteral stone - she has a stone that measures 5 mm in width somewhat long and has been on density of only about 1-200 Hounsfield units. It appears to possibly be a matrix stone. There is no evidence of infection as her urine remains clear, her white count is normal and she has no fever, hypotension or tachycardia. There is some hydronephrosis with some slight increase in her creatinine with a normal appearing contralateral kidney. Her pain is now controlled. I discussed with her and her family today the options. We discussed the stones size, location and density and the reason stones cause pain as well as nausea and vomiting. I therefore have discussed placement of the double-J stent which I told her would unobstruct her kidney and would result in resolution of her hydronephrosis. In addition due to the soft-appearing nature of the stone on her CT scan and its very possible her stone would pass spontaneously and if not it could be treated ureteroscopically. The other option is to proceed with medical expulsive therapy using alpha blocker. We discussed the mechanism of action of alpha-blocker therapy in patients with kidney stones and the need for follow-up. At this time the patient has elected to proceed with conservative therapy with medical expulsion using tamsulosin. She will be prescribed pain medication and because she lives in [place] she would like to follow-up at our [place] office.   Plan: 1. Begin tamsulosin 0.4 mg daily. 2. She'll be discharged with prescription for pain medication. 3. I have placed follow-up information on her chart and she will contact our office for appointment. 4. It would appear based on the density of her stone and its inability to be seen on the KUB reconstruction of the CT scan that she likely benefit from a renal ultrasound at the least and  possibly in combination with a KUB.    History of Present Illness: Misty Edwards is an 83 year old female patient seen in hospital consultation today for further evaluation of right flank pain secondary to a right ureteral calculus. She reports that she began having which she described as a stomachache consisting of pain in the right lower quadrant yesterday. The pain intensified and was associated with nausea and bilious vomiting. She has not had any hematuria. She was increasing her fluid intake and this resulted in some slight increase in urinary frequency but has not had any other changes in her voiding pattern. She was seen in the emergency room at [facility] and there is an indication that attempts were made to contact me 3 times however this is incorrect as I was able to receive other pages from [facility] without difficulty and therefore these were obviously been called to the wrong physician as I was available. Because there was no return of pages she was not admitted there for pain control but sent to [facility]. She has received parenteral narcotics and her pain has been controlled. She denies any dysuria and has had UTIs in the past for the last time she had a UTI was 2 years ago. In 2004 she was told she had a stone.  Past Medical History:  Diagnosis Date  . Acute biliary pancreatitis 07/2002   thia was in 07/2004:she still has pseudocyst in tail of pancreas measuring 54 x 33 mm   . Adrenal adenoma    bilateral  . Anxiety   . Chronic pancreatitis ([place])   .  Detached retina   . Diabetes mellitus ([place])   . Diverticulosis   . History of carcinoma in situ of breast 1988  . Hyperlipidemia   . Hypertension   . IBS (irritable bowel syndrome)   . Legally blind   . Osteoarthritis   . Osteopenia   . Upper GI bleed September2004   secondary to gastritis   Past Surgical History:  Procedure Laterality Date  . CARDIAC CATHETERIZATION N/A 08/24/2014   Procedure: Left Heart Cath and Coronary  Angiography;  Surgeon: Leonie Man, MD;  Location: [place] [facility];  Service: Cardiovascular;  Laterality: N/A;  . COLONOSCOPY  08/15/05   few tiny diverticula at sigmoid colon/external hemorrhoids but no polyps  . COLONOSCOPY  01/08/2012   WUJ:[id number] diverticulosis. Next colonoscopy in 12/2016  . CORONARY ARTERY BYPASS GRAFT N/A 08/24/2014   Procedure: CORONARY ARTERY BYPASS GRAFTING (CABG) x three, using left internal mammary artery and right leg greater saphenous vein harvested endoscopically;  Surgeon: Ivin Poot, MD;  Location: [place];  Service: Open Heart Surgery;  Laterality: N/A;  . ECTOPIC PREGNANCY SURGERY  1950's  . ERCP with sphincterotomy  07/2002  . ESOPHAGOGASTRODUODENOSCOPY      Gastritis of body.  Otherwise normal  . ESOPHAGOGASTRODUODENOSCOPY  01/08/2012   RMR: Few scattered gastric erosions of uncertain significance-status post biopsy. Minimal chronic inflammation, no H.pylori  . LAPAROSCOPIC CHOLECYSTECTOMY    . PANCREATIC PSEUDOCYST DRAINAGE  [id number]   drained percutaneously   . right mastectomy    . tacking up of her bladder    . TEE WITHOUT CARDIOVERSION N/A 08/24/2014   Procedure: TRANSESOPHAGEAL ECHOCARDIOGRAM (TEE);  Surgeon: Ivin Poot, MD;  Location: [place];  Service: Open Heart Surgery;  Laterality: N/A;    Medications:  Prior to Admission:  Prescriptions Prior to Admission  Medication Sig Dispense Refill Last Dose  . acetaminophen (TYLENOL) 500 MG tablet Take 500-1,000 mg by mouth 2 (two) times daily as needed. Pain.   09/26/2016 at 1700  . amLODipine (NORVASC) 5 MG tablet TAKE ONE TABLET BY MOUTH ONCE DAILY 90 tablet 3 09/25/2016 at Unknown time  . aspirin EC 81 MG EC tablet Take 1 tablet (81 mg total) by mouth daily. (Patient taking differently: Take 81 mg by mouth every morning. )   09/26/2016 at Unknown time  . cetirizine (ZYRTEC) 10 MG tablet TAKE ONE TABLET BY MOUTH ONCE DAILY 90 tablet 3 09/26/2016 at Unknown time  . CREON 24000 units CPEP TAKE  TWO CAPSULES BY MOUTH WITH MEALS AND  ONE  CAPSULE  WITH  SNACKS 720 capsule 3 09/26/2016 at Unknown time  . cycloSPORINE (RESTASIS) 0.05 % ophthalmic emulsion Place 1 drop into both eyes daily as needed.    Past Month at Unknown time  . docusate sodium (COLACE) 100 MG capsule Take 1 capsule (100 mg total) by mouth 2 (two) times daily as needed for mild constipation. 60 capsule 5 UNKNOWN  . enalapril (VASOTEC) 20 MG tablet TAKE ONE TABLET BY MOUTH ONCE DAILY 90 tablet 3 09/26/2016 at Unknown time  . ferrous sulfate 325 (65 FE) MG tablet Take 325 mg by mouth daily with breakfast.   09/26/2016 at Unknown time  . FLUoxetine (PROZAC) 20 MG tablet TAKE ONE TABLET BY MOUTH ONCE DAILY (Patient taking differently: TAKE ONE TABLET BY MOUTH ONCE DAILY AT BEDTIME) 90 tablet 3 09/25/2016 at Unknown time  . furosemide (LASIX) 20 MG tablet TAKE ONE TABLET BY MOUTH ONCE DAILY (Patient taking differently: TAKE ONE TABLET BY  MOUTH ONCE DAILY AS NEEDED FOR FLUID) 90 tablet 2 UNKNOWN  . hydrALAZINE (APRESOLINE) 10 MG tablet Two Times Daily As Needed for Systolic BP above 161 (Patient taking differently: 10 mg as needed. Two Times Daily As Needed for Systolic BP above 096) 60 tablet 3 UNKNOWN  . insulin glargine (LANTUS) 100 UNIT/ML injection Inject 0.25 mLs (25 Units total) into the skin every morning. (Patient taking differently: Inject 35 Units into the skin every morning. ) 10 mL 5 09/26/2016 at Unknown time  . LORazepam (ATIVAN) 0.5 MG tablet TAKE 1 TO 2 TABLETS BY MOUTH ONCE DAILY AT BEDTIME AS NEEDED FOR INSOMNIA (Patient taking differently: TAKE  2 TABLETS BY MOUTH ONCE DAILY AT BEDTIME AS NEEDED FOR INSOMNIA) 60 tablet 1 09/25/2016 at Unknown time  . metFORMIN (GLUCOPHAGE-XR) 500 MG 24 hr tablet TAKE ONE TABLET BY MOUTH ONCE DAILY WITH BREAKFAST 90 tablet 1 09/26/2016 at Unknown time  . metoprolol tartrate (LOPRESSOR) 25 MG tablet TAKE ONE TABLET BY MOUTH TWICE DAILY 180 tablet 3 09/26/2016 at [place]  . Multiple Vitamin  (MULTIVITAMIN) capsule Take 1 capsule by mouth daily.   09/26/2016 at Unknown time  . omeprazole (PRILOSEC) 40 MG capsule TAKE ONE CAPSULE BY MOUTH ONCE DAILY 90 capsule 3 09/26/2016 at Unknown time  . promethazine (PHENERGAN) 12.5 MG tablet Take 0.5-1 tablets (6.25-12.5 mg total) by mouth every 6 (six) hours as needed for nausea or vomiting. 30 tablet 0 09/26/2016 at Unknown time  . rosuvastatin (CRESTOR) 5 MG tablet TAKE ONE TABLET BY MOUTH ONCE DAILY (Patient taking differently: TAKE ONE TABLET BY MOUTH ONCE DAILY AT BEDTIME) 30 tablet 1 09/25/2016 at Unknown time  . triamcinolone cream (KENALOG) 0.1 % Apply 1 application topically 2 (two) times daily. 60 g 0 Past Week at Unknown time  . Insulin Syringes, Disposable, U-100 1 ML MISC 1/2 inch 31 g 300 each 3 Taking  . ONE TOUCH ULTRA TEST test strip USE TO TEST UP TO 5 TIMES DAILY AS DIRECTED. 450 each 0 Taking  . peg 3350 powder (MOVIPREP) 100 g SOLR Take 1 kit (200 g total) by mouth as directed. 1 kit 0   . potassium chloride (K-DUR) 10 MEQ tablet TAKE ONE TABLET BY MOUTH ONCE DAILY 90 tablet 2     Allergies:  Allergies  Allergen Reactions  . Calcium-Containing Compounds Nausea Only  . Morphine And Related     Hallucination  . Raloxifene Itching    Face and eyes burning  . Vitamin D Analogs Nausea Only    Family History  Problem Relation Age of Onset  . Ovarian cancer Mother        passed  . Colon cancer Father 33       passed away in his 33's  . Colon cancer Brother 44       surgery inhis 50's doing well now  . Heart attack Brother        passed away age 26  . Pancreatitis Brother   . Kidney disease Daughter   . Leukemia Son     Social History:  reports that she has never smoked. She has never used smokeless tobacco. She reports that she does not drink alcohol or use drugs.  Review of Systems (10 point): Pertinent items are noted in HPI. A comprehensive review of systems was negative except as noted above.  Physical Exam:    Vital signs in last 24 hours: Temp:  [97.6 F (36.4 Edwards)-98.8 F (37.1 Edwards)] 97.6 F (36.4 Edwards) (07/14  8841) Pulse Rate:  [88-111] 88 (07/14 0813) Resp:  [15-48] 22 (07/14 0813) BP: (98-200)/(40-78) 107/40 (07/14 0813) SpO2:  [93 %-98 %] 96 % (07/14 0813) Weight:  [63.5 kg (140 lb)] 63.5 kg (140 lb) (07/13 2016) General appearance: alert and appears stated age Head: Normocephalic, without obvious abnormality, atraumatic Eyes: conjunctivae/corneas clear. EOM's intact.  Oropharynx: moist mucous membranes Neck: supple, symmetrical, trachea midline Resp: normal respiratory effort Cardio: regular rate and rhythm Back: symmetric, no curvature. ROM normal. Mild right CVA tenderness. GI: soft, non-tender; bowel sounds normal; no masses,  no organomegaly Extremities: extremities normal, atraumatic, no cyanosis or edema Skin: Skin color normal. No visible rashes or lesions Neurologic: Grossly normal  Laboratory Data:   Recent Labs  09/26/16 2031 09/27/16 0616  WBC 9.3 16.0*  HGB 10.5* 10.3*  HCT 32.6* 32.5*   BMET  Recent Labs  09/26/16 2031 09/27/16 0616  NA 138 138  K 3.6 3.3*  CL 104 103  CO2 24 24  GLUCOSE 150* 168*  BUN 18 24*  CREATININE 1.23* 1.73*  CALCIUM 9.5 8.8*   No results for input(s): LABPT, INR in the last 72 hours. No results for input(s): LABURIN in the last 72 hours. Results for orders placed or performed during the hospital encounter of 09/26/16  MRSA PCR Screening     Status: None   Collection Time: 09/27/16  5:25 AM  Result Value Ref Range Status   MRSA by PCR NEGATIVE NEGATIVE Final    Comment:        The GeneXpert MRSA Assay (FDA approved for NASAL specimens only), is one component of a comprehensive MRSA colonization surveillance program. It is not intended to diagnose MRSA infection nor to guide or monitor treatment for MRSA infections.    Creatinine:  Recent Labs  09/26/16 2031 09/27/16 0616  CREATININE 1.23* 1.73*     Imaging: Dg Chest 2 View  Result Date: 09/26/2016 CLINICAL DATA:  83 year old female with abdominal pain, nausea vomiting. History of breast cancer. EXAM: CHEST  2 VIEW COMPARISON:  Chest radiograph dated 09/27/2014 FINDINGS: There is mild cardiomegaly. There is diffuse interstitial prominence and nodularity which may represent interstitial edema, or lymphangitic spread of tumor on this patient with history of breast cancer. Atypical pneumonia is not excluded. Clinical correlation is recommended. There is no pleural effusion or pneumothorax. Median sternotomy wires and CABG vascular clips. Surgical clips noted over the right side of chest. No acute osseous pathology. IMPRESSION: Diffuse interstitial prominence and nodularity which may represent edema but is concerning for lymphangitic spread of tumor in this patient with history of breast cancer. Pneumonia is not excluded. Clinical correlation is recommended. Electronically Signed   By: Anner Crete M.D.   On: 09/26/2016 23:12   Ct Abdomen Pelvis W Contrast  Result Date: 09/26/2016 CLINICAL DATA:  Sharp intermittent pain in the right lower abdomen EXAM: CT ABDOMEN AND PELVIS WITH CONTRAST TECHNIQUE: Multidetector CT imaging of the abdomen and pelvis was performed using the standard protocol following bolus administration of intravenous contrast. CONTRAST:  126m ISOVUE-300 IOPAMIDOL (ISOVUE-300) INJECTION 61% COMPARISON:  02/04/2012 FINDINGS: Lower chest: Small bilateral pleural effusions. Patchy dependent atelectasis. Borderline to mild cardiomegaly. Hepatobiliary: No focal liver abnormality is seen. Status post cholecystectomy. No biliary dilatation. Pancreas: Very atrophic.  No inflammatory changes. Spleen: Normal in size without focal abnormality. Adrenals/Urinary Tract: Adrenal glands are within normal limits. Left kidney shows no hydronephrosis. There is moderate right perinephric fat stranding. Upper pole stones on the right, measuring up to  9 mm. Moderate severe hydronephrosis of the right kidney and proximal right ureter, secondary to diffuse increased density within the proximal right ureter, measuring 4 mm transverse, but over a cranial caudad length of about 2 cm, suspect that these are multiple stones within the proximal right ureter. Bladder unremarkable Stomach/Bowel: The stomach is nonenlarged. No dilated small bowel. Collapsed colon versus mild wall thickening of the right colon and transverse colon. Normal appendix. Vascular/Lymphatic: Aortic atherosclerosis. No enlarged abdominal or pelvic lymph nodes. Reproductive: Numerous calcified fibroids in the uterus. No adnexal masses. Other: No free air or free fluid. Musculoskeletal: Degenerative changes. No acute or suspicious bone lesion. IMPRESSION: 1. Moderate perinephric fat stranding on the right. Moderate to marked right hydronephrosis and proximal hydroureter, secondary to diffuse increased density in the proximal right ureter over a 2 cm craniocaudad length, suspected to represent multiple stones in the proximal right ureter. 2. Negative appendix 3. Marked atrophy of the pancreas 4. Multiple calcified uterine fibroids. 5. Trace bilateral effusions. Electronically Signed   By: Donavan Foil M.D.   On: 09/26/2016 23:23    CT scan images were independently reviewed.   Misty Edwards 09/27/2016, 9:22 AM

## 2016-09-27 NOTE — Progress Notes (Signed)
    Patient admitted after midnight see H&P for further details   83 y/o F with multiple comorbidities transferred to [facility] due to nephrolithiasis with hydronephrosis. Urology was consulted, recommending Flomax and pain control with op follow up.   Urology to see patient - will await further recommendations   Chipper Oman, MD

## 2016-09-28 DIAGNOSIS — E1159 Type 2 diabetes mellitus with other circulatory complications: Secondary | ICD-10-CM | POA: Diagnosis not present

## 2016-09-28 DIAGNOSIS — R109 Unspecified abdominal pain: Secondary | ICD-10-CM

## 2016-09-28 DIAGNOSIS — N1330 Unspecified hydronephrosis: Secondary | ICD-10-CM

## 2016-09-28 DIAGNOSIS — N132 Hydronephrosis with renal and ureteral calculous obstruction: Secondary | ICD-10-CM | POA: Diagnosis not present

## 2016-09-28 DIAGNOSIS — N200 Calculus of kidney: Secondary | ICD-10-CM | POA: Diagnosis not present

## 2016-09-28 LAB — GLUCOSE, CAPILLARY
GLUCOSE-CAPILLARY: 346 mg/dL — AB (ref 65–99)
GLUCOSE-CAPILLARY: 252 mg/dL — AB (ref 65–99)
Glucose-Capillary: 311 mg/dL — ABNORMAL HIGH (ref 65–99)
Glucose-Capillary: 337 mg/dL — ABNORMAL HIGH (ref 65–99)
Glucose-Capillary: 258 mg/dL — ABNORMAL HIGH (ref 65–99)

## 2016-09-28 LAB — CBC WITH DIFFERENTIAL/PLATELET
BASOS PCT: 0 %
Basophils Absolute: 0 10*3/uL (ref 0.0–0.1)
EOS ABS: 0 10*3/uL (ref 0.0–0.7)
Eosinophils Relative: 0 %
HCT: 28.8 % — ABNORMAL LOW (ref 36.0–46.0)
Hemoglobin: 9.4 g/dL — ABNORMAL LOW (ref 12.0–15.0)
Lymphocytes Relative: 6 %
Lymphs Abs: 0.8 10*3/uL (ref 0.7–4.0)
MCH: 27.1 pg (ref 26.0–34.0)
MCHC: 32.6 g/dL (ref 30.0–36.0)
MCV: 83 fL (ref 78.0–100.0)
MONO ABS: 0.5 10*3/uL (ref 0.1–1.0)
MONOS PCT: 4 %
NEUTROS PCT: 90 %
Neutro Abs: 10.8 10*3/uL — ABNORMAL HIGH (ref 1.7–7.7)
Platelets: 55 10*3/uL — ABNORMAL LOW (ref 150–400)
RBC: 3.47 MIL/uL — ABNORMAL LOW (ref 3.87–5.11)
RDW: 16.1 % — AB (ref 11.5–15.5)
WBC: 12 10*3/uL — ABNORMAL HIGH (ref 4.0–10.5)

## 2016-09-28 LAB — BASIC METABOLIC PANEL
Anion gap: 11 (ref 5–15)
BUN: 36 mg/dL — ABNORMAL HIGH (ref 6–20)
CALCIUM: 8.2 mg/dL — AB (ref 8.9–10.3)
CO2: 22 mmol/L (ref 22–32)
CREATININE: 1.88 mg/dL — AB (ref 0.44–1.00)
Chloride: 100 mmol/L — ABNORMAL LOW (ref 101–111)
GFR, EST AFRICAN AMERICAN: 27 mL/min — AB (ref >60)
GFR, EST NON AFRICAN AMERICAN: 23 mL/min — AB (ref >60)
GLUCOSE: 360 mg/dL — AB (ref 65–99)
Potassium: 4.2 mmol/L (ref 3.5–5.1)
Sodium: 133 mmol/L — ABNORMAL LOW (ref 135–145)

## 2016-09-28 MED ORDER — CEFDINIR 300 MG PO CAPS: 300.0000 mg | ORAL_CAPSULE | Freq: Every day | ORAL | 0 refills | Status: AC

## 2016-09-28 MED ORDER — OXYCODONE-ACETAMINOPHEN 5-325 MG PO TABS: 1.0000 | ORAL_TABLET | Freq: Three times a day (TID) | ORAL | 0 refills | Status: DC | PRN

## 2016-09-28 MED ORDER — LORAZEPAM 0.5 MG PO TABS
0.5000 mg | ORAL_TABLET | Freq: Once | ORAL | Status: AC
  Administered 2016-09-28: 0.5 mg via ORAL
  Filled 2016-09-28: qty 1

## 2016-09-28 MED ORDER — SODIUM CHLORIDE 0.9 % IV BOLUS (SEPSIS): 1000.0000 mL | Freq: Once | INTRAVENOUS | Status: DC

## 2016-09-28 MED ORDER — SODIUM CHLORIDE 0.9 % IV BOLUS (SEPSIS)
250.0000 mL | Freq: Once | INTRAVENOUS | Status: AC
  Administered 2016-09-28: 250 mL via INTRAVENOUS

## 2016-09-28 MED ORDER — PROMETHAZINE HCL 12.5 MG PO TABS: 6.2500 mg | ORAL_TABLET | Freq: Four times a day (QID) | ORAL | 0 refills | Status: DC | PRN

## 2016-09-28 MED ORDER — TAMSULOSIN HCL 0.4 MG PO CAPS: 0.4000 mg | ORAL_CAPSULE | Freq: Every day | ORAL | 0 refills | Status: DC

## 2016-09-28 MED ORDER — ACETAMINOPHEN 325 MG PO TABS
650.0000 mg | ORAL_TABLET | Freq: Once | ORAL | Status: AC
  Administered 2016-09-28: 650 mg via ORAL
  Filled 2016-09-28: qty 2

## 2016-09-28 NOTE — Progress Notes (Signed)
Patient forgot discharge prescription. Notified Patients daughter [name] @ [phone] to pick up script she states she will come back on Monday 09/29/16 and pick it up.  Informed night shift  Charge nurse Larene Beach of situation. Prescription placed in seal discharge packet with patients name.

## 2016-09-28 NOTE — Progress Notes (Signed)
Discharge instructions reviewed with patient. Patient discharged to home

## 2016-09-28 NOTE — Discharge Summary (Signed)
Physician Discharge Summary  Misty Edwards  LEX:[id number]  DOB: Aug 27, 1926  DOA: 09/26/2016 PCP: Susy Frizzle, MD  Admit date: 09/26/2016 Discharge date: 09/28/2016  Admitted From: Home  Disposition:  Home   Recommendations for Outpatient Follow-up:  1. Follow up with PCP in 1 week 2. Please obtain BMP/CBC in one week to monitor Hgb and renal function  3. Follow-up with urologist within 3-5 days  Home Health: None  Equipment/Devices: None    Discharge Condition: Stable  CODE STATUS: FULL  Diet recommendation: Heart Healthy / Carb Modified   Brief/Interim Summary: Misty Edwards is a 83 year old female with medical history of chronic pancreatitis, hyperlipidemia, CAD status post MI with CABG 2 years ago, diabetes mellitus, hypertension presented to the emergency department @ [facility] complaining of right flank pain and nausea. CT of the abdomen show right ureteral hydronephrosis due to nephrolithiasis. Patient was transferred to [facility] long hospital due to lack of urological services at [facility]. Patient was admitted for IV fluids and urological evaluation. Urology recommended pain management, start Flomax and follow-up as an outpatient. Patient was observed over 24 hour due to low blood pressure which improved with gentle IV fluid hydration.   Of note, in the ED @ [facility] patient was given aggressive IV hydration, develop wheezing and chest x-ray show vascular condition for which she was given 60 mg IV Lasix, this leading to affect her renal function.  Subjective: Patient seen and examined today discharge, she reported doing well complaining of mild nausea likely from Percocet. Abdominal pain is well controlled. Blood pressure improved. Patient afebrile, white count improved. Patient tolerated diet well and have no other concerns.  Discharge Diagnoses/Hospital Course:  Nephrolithiasis with hydronephrosis on the right side Urology consulted and recommended outpatient follow-up  treatment with pain medication and Flomax. Foley catheter was removed and patient able to void without any issues. Patient was advised to call urology office tomorrow to make an appointment for this coming week.  AKI secondary to nephrolithiasis and use of IV diuresis. Patient was given IV Lasix 60 mg subsequently creatinine increase. Given patient was hypotensive and high-dose of IV Lasix was given creatinine was suspected to increase this can be monitor as an outpatient. Encourage oral hydration, check BMP in 1 week with PCP  Hypertension Blood pressure medications were held due to low BP likely from dehydration. BP has been stabilized after receiving a few boluses of 250 mL of nasal saline. We will hold ACE inhibitor given mild AKI Resume amlodipine and metoprolol  DM type 2 Metformin has been placed on hold due to low GFR Continue insulin Monitor fasting blood sugar goal of less than 140  Follow-up with PCP  All other chronic medical condition were stable during the hospitalization.  On the day of the discharge the patient's vitals were stable, and no other acute medical condition were reported by patient. Patient was felt safe to be discharge to home  Discharge Instructions  You were cared for by a hospitalist during your hospital stay. If you have any questions about your discharge medications or the care you received while you were in the hospital after you are discharged, you can call the unit and asked to speak with the hospitalist on call if the hospitalist that took care of you is not available. Once you are discharged, your primary care physician will handle any further medical issues. Please note that NO REFILLS for any discharge medications will be authorized once you are discharged, as it is  imperative that you return to your primary care physician (or establish a relationship with a primary care physician if you do not have one) for your aftercare needs so that they can  reassess your need for medications and monitor your lab values.  Discharge Instructions    Diet - low sodium heart healthy    Complete by:  As directed    Increase activity slowly    Complete by:  As directed      Allergies as of 09/28/2016      Reactions   Calcium-containing Compounds Nausea Only   Morphine And Related    Hallucination   Raloxifene Itching   Face and eyes burning   Vitamin D Analogs Nausea Only      Medication List    STOP taking these medications   enalapril 20 MG tablet Commonly known as:  VASOTEC   metFORMIN 500 MG 24 hr tablet Commonly known as:  GLUCOPHAGE-XR     TAKE these medications   acetaminophen 500 MG tablet Commonly known as:  TYLENOL Take 500-1,000 mg by mouth 2 (two) times daily as needed. Pain.   amLODipine 5 MG tablet Commonly known as:  NORVASC TAKE ONE TABLET BY MOUTH ONCE DAILY   aspirin 81 MG EC tablet Take 1 tablet (81 mg total) by mouth daily. What changed:  when to take this   cefdinir 300 MG capsule Commonly known as:  OMNICEF Take 1 capsule (300 mg total) by mouth daily.   cetirizine 10 MG tablet Commonly known as:  ZYRTEC TAKE ONE TABLET BY MOUTH ONCE DAILY   CREON 24000-76000 units Cpep Generic drug:  Pancrelipase (Lip-Prot-Amyl) TAKE TWO CAPSULES BY MOUTH WITH MEALS AND  ONE  CAPSULE  WITH  SNACKS   cycloSPORINE 0.05 % ophthalmic emulsion Commonly known as:  RESTASIS Place 1 drop into both eyes daily as needed.   docusate sodium 100 MG capsule Commonly known as:  COLACE Take 1 capsule (100 mg total) by mouth 2 (two) times daily as needed for mild constipation.   ferrous sulfate 325 (65 FE) MG tablet Take 325 mg by mouth daily with breakfast.   FLUoxetine 20 MG tablet Commonly known as:  PROZAC TAKE ONE TABLET BY MOUTH ONCE DAILY What changed:  See the new instructions.   furosemide 20 MG tablet Commonly known as:  LASIX TAKE ONE TABLET BY MOUTH ONCE DAILY What changed:  See the new instructions.    hydrALAZINE 10 MG tablet Commonly known as:  APRESOLINE Two Times Daily As Needed for Systolic BP above 010 What changed:  how much to take  when to take this  reasons to take this  additional instructions   insulin glargine 100 UNIT/ML injection Commonly known as:  LANTUS Inject 0.25 mLs (25 Units total) into the skin every morning. What changed:  how much to take   Insulin Syringes (Disposable) U-100 1 ML Misc 1/2 inch 31 g   LORazepam 0.5 MG tablet Commonly known as:  ATIVAN TAKE 1 TO 2 TABLETS BY MOUTH ONCE DAILY AT BEDTIME AS NEEDED FOR INSOMNIA What changed:  See the new instructions.   metoprolol tartrate 25 MG tablet Commonly known as:  LOPRESSOR TAKE ONE TABLET BY MOUTH TWICE DAILY   multivitamin capsule Take 1 capsule by mouth daily.   omeprazole 40 MG capsule Commonly known as:  PRILOSEC TAKE ONE CAPSULE BY MOUTH ONCE DAILY   ONE TOUCH ULTRA TEST test strip Generic drug:  glucose blood USE TO TEST UP TO 5 TIMES  DAILY AS DIRECTED.   oxyCODONE-acetaminophen 5-325 MG tablet Commonly known as:  PERCOCET/ROXICET Take 1 tablet by mouth every 8 (eight) hours as needed for moderate pain.   peg 3350 powder 100 g Solr Commonly known as:  MOVIPREP Take 1 kit (200 g total) by mouth as directed.   potassium chloride 10 MEQ tablet Commonly known as:  K-DUR TAKE ONE TABLET BY MOUTH ONCE DAILY   promethazine 12.5 MG tablet Commonly known as:  PHENERGAN Take 0.5-1 tablets (6.25-12.5 mg total) by mouth every 6 (six) hours as needed for nausea or vomiting.   rosuvastatin 5 MG tablet Commonly known as:  CRESTOR TAKE ONE TABLET BY MOUTH ONCE DAILY What changed:  See the new instructions.   tamsulosin 0.4 MG Caps capsule Commonly known as:  FLOMAX Take 1 capsule (0.4 mg total) by mouth daily.   triamcinolone cream 0.1 % Commonly known as:  KENALOG Apply 1 application topically 2 (two) times daily.      Follow-up Information    Call [facility].   Why:  For an appointment in ~1 week when you get home. Contact information: [address], [address] [place] [phone]         Allergies  Allergen Reactions  . Calcium-Containing Compounds Nausea Only  . Morphine And Related     Hallucination  . Raloxifene Itching    Face and eyes burning  . Vitamin D Analogs Nausea Only    Consultations:  Urology    Procedures/Studies: Dg Chest 2 View  Result Date: 09/26/2016 CLINICAL DATA:  83 year old female with abdominal pain, nausea vomiting. History of breast cancer. EXAM: CHEST  2 VIEW COMPARISON:  Chest radiograph dated 09/27/2014 FINDINGS: There is mild cardiomegaly. There is diffuse interstitial prominence and nodularity which may represent interstitial edema, or lymphangitic spread of tumor on this patient with history of breast cancer. Atypical pneumonia is not excluded. Clinical correlation is recommended. There is no pleural effusion or pneumothorax. Median sternotomy wires and CABG vascular clips. Surgical clips noted over the right side of chest. No acute osseous pathology. IMPRESSION: Diffuse interstitial prominence and nodularity which may represent edema but is concerning for lymphangitic spread of tumor in this patient with history of breast cancer. Pneumonia is not excluded. Clinical correlation is recommended. Electronically Signed   By: Anner Crete M.D.   On: 09/26/2016 23:12   Ct Abdomen Pelvis W Contrast  Result Date: 09/26/2016 CLINICAL DATA:  Sharp intermittent pain in the right lower abdomen EXAM: CT ABDOMEN AND PELVIS WITH CONTRAST TECHNIQUE: Multidetector CT imaging of the abdomen and pelvis was performed using the standard protocol following bolus administration of intravenous contrast. CONTRAST:  132m ISOVUE-300 IOPAMIDOL (ISOVUE-300) INJECTION 61% COMPARISON:  02/04/2012 FINDINGS: Lower chest: Small bilateral pleural effusions. Patchy dependent atelectasis. Borderline to  mild cardiomegaly. Hepatobiliary: No focal liver abnormality is seen. Status post cholecystectomy. No biliary dilatation. Pancreas: Very atrophic.  No inflammatory changes. Spleen: Normal in size without focal abnormality. Adrenals/Urinary Tract: Adrenal glands are within normal limits. Left kidney shows no hydronephrosis. There is moderate right perinephric fat stranding. Upper pole stones on the right, measuring up to 9 mm. Moderate severe hydronephrosis of the right kidney and proximal right ureter, secondary to diffuse increased density within the proximal right ureter, measuring 4 mm transverse, but over a cranial caudad length of about 2 cm, suspect that these are multiple stones within the proximal right ureter. Bladder unremarkable Stomach/Bowel: The stomach is nonenlarged. No dilated small bowel.  Collapsed colon versus mild wall thickening of the right colon and transverse colon. Normal appendix. Vascular/Lymphatic: Aortic atherosclerosis. No enlarged abdominal or pelvic lymph nodes. Reproductive: Numerous calcified fibroids in the uterus. No adnexal masses. Other: No free air or free fluid. Musculoskeletal: Degenerative changes. No acute or suspicious bone lesion. IMPRESSION: 1. Moderate perinephric fat stranding on the right. Moderate to marked right hydronephrosis and proximal hydroureter, secondary to diffuse increased density in the proximal right ureter over a 2 cm craniocaudad length, suspected to represent multiple stones in the proximal right ureter. 2. Negative appendix 3. Marked atrophy of the pancreas 4. Multiple calcified uterine fibroids. 5. Trace bilateral effusions. Electronically Signed   By: Donavan Foil M.D.   On: 09/26/2016 23:23    Discharge Exam: Vitals:   09/28/16 0942 09/28/16 1443  BP: (!) 118/49 (!) 148/60  Pulse: 92 94  Resp:  16  Temp:  98 F (36.7 C)   Vitals:   09/27/16 1933 09/28/16 0529 09/28/16 0942 09/28/16 1443  BP: (!) 133/55 (!) 122/55 (!) 118/49 (!)  148/60  Pulse: 95 90 92 94  Resp: '16 16  16  ' Temp: 98.4 F (36.9 C) 98 F (36.7 C)  98 F (36.7 C)  TempSrc: Oral Oral  Oral  SpO2: 94% 93% 97% 92%  Weight:      Height:        General: Pt is alert, awake, not in acute distress Cardiovascular: RRR, S1/S2 +, no rubs, no gallops Respiratory: CTA bilaterally, no wheezing, no rhonchi Abdominal: Soft, NT, ND, bowel sounds + Extremities: no edema, no cyanosis  The results of significant diagnostics from this hospitalization (including imaging, microbiology, ancillary and laboratory) are listed below for reference.    Microbiology: Recent Results (from the past 240 hour(s))  Urine culture     Status: Abnormal (Preliminary result)   Collection Time: 09/26/16 11:00 PM  Result Value Ref Range Status   Specimen Description URINE, CATHETERIZED  Final   Special Requests NONE  Final   Culture (A)  Final    >=100,000 COLONIES/mL PROTEUS MIRABILIS SUSCEPTIBILITIES TO FOLLOW Performed at [facility], [address], [place], [place] [address]    Report Status PENDING  Incomplete  MRSA PCR Screening     Status: None   Collection Time: 09/27/16  5:25 AM  Result Value Ref Range Status   MRSA by PCR NEGATIVE NEGATIVE Final    Comment:        The GeneXpert MRSA Assay (FDA approved for NASAL specimens only), is one component of a comprehensive MRSA colonization surveillance program. It is not intended to diagnose MRSA infection nor to guide or monitor treatment for MRSA infections.     Labs: BNP (last 3 results)  Recent Labs  09/26/16 2031  BNP 0,981.1*   Basic Metabolic Panel:  Recent Labs Lab 09/26/16 2031 09/27/16 0616 09/28/16 0400  NA 138 138 133*  K 3.6 3.3* 4.2  CL 104 103 100*  CO2 '24 24 22  ' GLUCOSE 150* 168* 360*  BUN 18 24* 36*  CREATININE 1.23* 1.73* 1.88*  CALCIUM 9.5 8.8* 8.2*   Liver Function Tests:  Recent Labs Lab 09/26/16 2031  AST 24  ALT 17  ALKPHOS 34*  BILITOT 0.9  PROT 6.8   ALBUMIN 3.9    Recent Labs Lab 09/26/16 2031  LIPASE 15   CBC:  Recent Labs Lab 09/26/16 2031 09/27/16 0616 09/28/16 0400  WBC 9.3 16.0* 12.0*  NEUTROABS 8.8*  --  10.8*  HGB 10.5* 10.3* 9.4*  HCT 32.6* 32.5* 28.8*  MCV 83.0 83.8 83.0  PLT 103* 76* 55*   Cardiac Enzymes:  Recent Labs Lab 09/26/16 2031  TROPONINI <0.03   CBG:  Recent Labs Lab 09/27/16 1646 09/28/16 0526 09/28/16 0805 09/28/16 1205 09/28/16 1518  GLUCAP 304* 346* 311* 337* 258*    Thyroid function studies  Recent Labs  09/27/16 0616  TSH 0.188*   Urinalysis    Component Value Date/Time   COLORURINE YELLOW 09/26/2016 2300   APPEARANCEUR HAZY (A) 09/26/2016 2300   LABSPEC 1.017 09/26/2016 2300   PHURINE 7.0 09/26/2016 2300   GLUCOSEU NEGATIVE 09/26/2016 2300   HGBUR SMALL (A) 09/26/2016 2300   BILIRUBINUR NEGATIVE 09/26/2016 2300   KETONESUR NEGATIVE 09/26/2016 2300   PROTEINUR NEGATIVE 09/26/2016 2300   UROBILINOGEN 0.2 09/02/2014 1445   NITRITE NEGATIVE 09/26/2016 2300   LEUKOCYTESUR TRACE (A) 09/26/2016 2300   Sepsis Labs Invalid input(s): PROCALCITONIN,  WBC,  LACTICIDVEN Microbiology Recent Results (from the past 240 hour(s))  Urine culture     Status: Abnormal (Preliminary result)   Collection Time: 09/26/16 11:00 PM  Result Value Ref Range Status   Specimen Description URINE, CATHETERIZED  Final   Special Requests NONE  Final   Culture (A)  Final    >=100,000 COLONIES/mL PROTEUS MIRABILIS SUSCEPTIBILITIES TO FOLLOW Performed at [facility], [address] [place], [place] [address]    Report Status PENDING  Incomplete  MRSA PCR Screening     Status: None   Collection Time: 09/27/16  5:25 AM  Result Value Ref Range Status   MRSA by PCR NEGATIVE NEGATIVE Final    Comment:        The GeneXpert MRSA Assay (FDA approved for NASAL specimens only), is one component of a comprehensive MRSA colonization surveillance program. It is not intended to diagnose  MRSA infection nor to guide or monitor treatment for MRSA infections.     Time coordinating discharge: 25 minutes  SIGNED:  Chipper Oman, MD  [facility] 09/28/2016, 3:39 PM  Pager please text page via  [email or web address] Password [email or web address]

## 2016-09-29 ENCOUNTER — Telehealth: Payer: Self-pay | Admitting: Family Medicine

## 2016-09-29 LAB — URINE CULTURE: Culture: >=100000 — AB

## 2016-09-29 MED ORDER — INSULIN ASPART 100 UNIT/ML ~~LOC~~ SOLN: SUBCUTANEOUS | 11 refills | Status: DC

## 2016-09-29 NOTE — Telephone Encounter (Signed)
flomax will not affect her sugars.  WHat have her sugar been running lately, I do not want to over treat an isolated blood sugar?  Anything under 200 is ok.

## 2016-09-29 NOTE — Telephone Encounter (Signed)
Misty Edwards aware and would like to know what about her sugars being so high - do you want to increase her insulin?

## 2016-09-29 NOTE — Telephone Encounter (Signed)
Misty Edwards aware of provider recommendations

## 2016-09-29 NOTE — Telephone Encounter (Signed)
Pt was seen in ER for kidney stones and sent home on Flomax - Her FBS was 436 this am and ran high while in hosp. She is currently taking 35u qam. Wants to know what they should do now and will the Flomax make her sugars go up?

## 2016-09-29 NOTE — Telephone Encounter (Signed)
Medication called/sent to requested pharmacy  

## 2016-09-29 NOTE — Telephone Encounter (Signed)
Per [facility] -  Until infection is better, I would start sliding scale insulin with novolog for sugars (check every 6 hours)   150-200- 2 units  201-250-4 units  251-300-6 units  301-350-8 units  351-400-10 units  400+-12 units   Sugars will fluctuate wildly until infection is better.

## 2016-10-06 DIAGNOSIS — N201 Calculus of ureter: Secondary | ICD-10-CM | POA: Diagnosis not present

## 2016-10-20 DIAGNOSIS — N200 Calculus of kidney: Secondary | ICD-10-CM | POA: Diagnosis not present

## 2016-10-23 ENCOUNTER — Ambulatory Visit (HOSPITAL_COMMUNITY)
Admission: RE | Admit: 2016-10-23 | Discharge: 2016-10-23 | Disposition: A | Discharge status: Still In Hospital | Payer: MEDICARE | Source: Ambulatory Visit | Attending: Internal Medicine | Admitting: Internal Medicine

## 2016-10-23 ENCOUNTER — Emergency Department (HOSPITAL_COMMUNITY): Payer: MEDICARE

## 2016-10-23 ENCOUNTER — Encounter (HOSPITAL_COMMUNITY): Payer: Self-pay | Admitting: *Deleted

## 2016-10-23 ENCOUNTER — Other Ambulatory Visit: Payer: Self-pay | Admitting: Family Medicine

## 2016-10-23 ENCOUNTER — Inpatient Hospital Stay (HOSPITAL_COMMUNITY)
Admission: EM | Admit: 2016-10-23 | Discharge: 2016-10-24 | DRG: 291 | Disposition: A | Discharge status: Home / Self Care | Payer: MEDICARE | Attending: Internal Medicine | Admitting: Internal Medicine

## 2016-10-23 ENCOUNTER — Encounter (HOSPITAL_COMMUNITY)
Admission: RE | Disposition: A | Discharge status: Still In Hospital | Payer: Self-pay | Source: Ambulatory Visit | Attending: Internal Medicine

## 2016-10-23 ENCOUNTER — Encounter (HOSPITAL_COMMUNITY): Payer: Self-pay | Admitting: Emergency Medicine

## 2016-10-23 DIAGNOSIS — K219 Gastro-esophageal reflux disease without esophagitis: Secondary | ICD-10-CM | POA: Diagnosis present

## 2016-10-23 DIAGNOSIS — H548 Legal blindness, as defined in USA: Secondary | ICD-10-CM | POA: Diagnosis not present

## 2016-10-23 DIAGNOSIS — R195 Other fecal abnormalities: Secondary | ICD-10-CM | POA: Diagnosis present

## 2016-10-23 DIAGNOSIS — Z794 Long term (current) use of insulin: Secondary | ICD-10-CM

## 2016-10-23 DIAGNOSIS — Z8249 Family history of ischemic heart disease and other diseases of the circulatory system: Secondary | ICD-10-CM

## 2016-10-23 DIAGNOSIS — R42 Dizziness and giddiness: Secondary | ICD-10-CM | POA: Diagnosis not present

## 2016-10-23 DIAGNOSIS — Z951 Presence of aortocoronary bypass graft: Secondary | ICD-10-CM | POA: Diagnosis not present

## 2016-10-23 DIAGNOSIS — K861 Other chronic pancreatitis: Secondary | ICD-10-CM | POA: Diagnosis present

## 2016-10-23 DIAGNOSIS — Z800 Family history of malignant neoplasm of digestive organs: Secondary | ICD-10-CM | POA: Diagnosis not present

## 2016-10-23 DIAGNOSIS — Z7982 Long term (current) use of aspirin: Secondary | ICD-10-CM

## 2016-10-23 DIAGNOSIS — Z8041 Family history of malignant neoplasm of ovary: Secondary | ICD-10-CM | POA: Diagnosis not present

## 2016-10-23 DIAGNOSIS — Z86010 Personal history of colonic polyps: Secondary | ICD-10-CM

## 2016-10-23 DIAGNOSIS — Z888 Allergy status to other drugs, medicaments and biological substances status: Secondary | ICD-10-CM | POA: Diagnosis not present

## 2016-10-23 DIAGNOSIS — Z7952 Long term (current) use of systemic steroids: Secondary | ICD-10-CM

## 2016-10-23 DIAGNOSIS — E1169 Type 2 diabetes mellitus with other specified complication: Secondary | ICD-10-CM | POA: Diagnosis present

## 2016-10-23 DIAGNOSIS — K589 Irritable bowel syndrome without diarrhea: Secondary | ICD-10-CM | POA: Diagnosis not present

## 2016-10-23 DIAGNOSIS — Z9011 Acquired absence of right breast and nipple: Secondary | ICD-10-CM

## 2016-10-23 DIAGNOSIS — I5031 Acute diastolic (congestive) heart failure: Secondary | ICD-10-CM | POA: Insufficient documentation

## 2016-10-23 DIAGNOSIS — I10 Essential (primary) hypertension: Secondary | ICD-10-CM | POA: Diagnosis not present

## 2016-10-23 DIAGNOSIS — Z87442 Personal history of urinary calculi: Secondary | ICD-10-CM | POA: Diagnosis not present

## 2016-10-23 DIAGNOSIS — Z806 Family history of leukemia: Secondary | ICD-10-CM

## 2016-10-23 DIAGNOSIS — E1159 Type 2 diabetes mellitus with other circulatory complications: Secondary | ICD-10-CM | POA: Diagnosis present

## 2016-10-23 DIAGNOSIS — E785 Hyperlipidemia, unspecified: Secondary | ICD-10-CM | POA: Diagnosis not present

## 2016-10-23 DIAGNOSIS — Z885 Allergy status to narcotic agent status: Secondary | ICD-10-CM | POA: Diagnosis not present

## 2016-10-23 DIAGNOSIS — Z79899 Other long term (current) drug therapy: Secondary | ICD-10-CM

## 2016-10-23 DIAGNOSIS — M8580 Other specified disorders of bone density and structure, unspecified site: Secondary | ICD-10-CM | POA: Diagnosis not present

## 2016-10-23 DIAGNOSIS — G4700 Insomnia, unspecified: Secondary | ICD-10-CM | POA: Diagnosis present

## 2016-10-23 DIAGNOSIS — J9601 Acute respiratory failure with hypoxia: Secondary | ICD-10-CM | POA: Diagnosis present

## 2016-10-23 DIAGNOSIS — Z9049 Acquired absence of other specified parts of digestive tract: Secondary | ICD-10-CM

## 2016-10-23 DIAGNOSIS — D649 Anemia, unspecified: Secondary | ICD-10-CM

## 2016-10-23 DIAGNOSIS — I340 Nonrheumatic mitral (valve) insufficiency: Secondary | ICD-10-CM | POA: Diagnosis not present

## 2016-10-23 DIAGNOSIS — I5033 Acute on chronic diastolic (congestive) heart failure: Secondary | ICD-10-CM | POA: Diagnosis present

## 2016-10-23 DIAGNOSIS — I110 Hypertensive heart disease with heart failure: Secondary | ICD-10-CM | POA: Diagnosis not present

## 2016-10-23 LAB — CBC WITH DIFFERENTIAL/PLATELET
BASOS ABS: 0 10*3/uL (ref 0.0–0.1)
Basophils Relative: 0 %
Eosinophils Absolute: 0.1 10*3/uL (ref 0.0–0.7)
Eosinophils Relative: 1 %
HEMATOCRIT: 35.5 % — AB (ref 36.0–46.0)
HEMOGLOBIN: 11.6 g/dL — AB (ref 12.0–15.0)
LYMPHS PCT: 25 %
Lymphs Abs: 1.8 10*3/uL (ref 0.7–4.0)
MCH: 27.2 pg (ref 26.0–34.0)
MCHC: 32.7 g/dL (ref 30.0–36.0)
MCV: 83.3 fL (ref 78.0–100.0)
Monocytes Absolute: 0.4 10*3/uL (ref 0.1–1.0)
Monocytes Relative: 6 %
NEUTROS ABS: 4.8 10*3/uL (ref 1.7–7.7)
Neutrophils Relative %: 68 %
Platelets: 151 10*3/uL (ref 150–400)
RBC: 4.26 MIL/uL (ref 3.87–5.11)
RDW: 16 % — ABNORMAL HIGH (ref 11.5–15.5)
WBC: 7.1 10*3/uL (ref 4.0–10.5)

## 2016-10-23 LAB — TROPONIN I: Troponin I: <0.03 ng/mL (ref <0.03)

## 2016-10-23 LAB — BLOOD GAS, VENOUS
Acid-base deficit: 5.1 mmol/L — ABNORMAL HIGH (ref 0.0–2.0)
Bicarbonate: 20 mmol/L (ref 20.0–28.0)
O2 CONTENT: 5 L/min
O2 Saturation: 95.6 %
PH VEN: 7.31 (ref 7.250–7.430)
pCO2, Ven: 41 mmHg — ABNORMAL LOW (ref 44.0–60.0)
pO2, Ven: 89.3 mmHg — ABNORMAL HIGH (ref 32.0–45.0)

## 2016-10-23 LAB — GLUCOSE, CAPILLARY
GLUCOSE-CAPILLARY: 195 mg/dL — AB (ref 65–99)
Glucose-Capillary: 152 mg/dL — ABNORMAL HIGH (ref 65–99)
Glucose-Capillary: 301 mg/dL — ABNORMAL HIGH (ref 65–99)

## 2016-10-23 LAB — COMPREHENSIVE METABOLIC PANEL
ALK PHOS: 62 U/L (ref 38–126)
ALT: 24 U/L (ref 14–54)
AST: 28 U/L (ref 15–41)
Albumin: 4.2 g/dL (ref 3.5–5.0)
Anion gap: 10 (ref 5–15)
BILIRUBIN TOTAL: 1.2 mg/dL (ref 0.3–1.2)
BUN: 8 mg/dL (ref 6–20)
CALCIUM: 9.2 mg/dL (ref 8.9–10.3)
CHLORIDE: 100 mmol/L — AB (ref 101–111)
CO2: 21 mmol/L — ABNORMAL LOW (ref 22–32)
CREATININE: 0.7 mg/dL (ref 0.44–1.00)
GFR calc Af Amer: >60 mL/min (ref >60)
Glucose, Bld: 179 mg/dL — ABNORMAL HIGH (ref 65–99)
Potassium: 4.1 mmol/L (ref 3.5–5.1)
Sodium: 131 mmol/L — ABNORMAL LOW (ref 135–145)
Total Protein: 7.7 g/dL (ref 6.5–8.1)

## 2016-10-23 LAB — BRAIN NATRIURETIC PEPTIDE: B Natriuretic Peptide: 848 pg/mL — ABNORMAL HIGH (ref 0.0–100.0)

## 2016-10-23 LAB — URINALYSIS, ROUTINE W REFLEX MICROSCOPIC
BILIRUBIN URINE: NEGATIVE
Glucose, UA: 50 mg/dL — AB
HGB URINE DIPSTICK: NEGATIVE
Ketones, ur: NEGATIVE mg/dL
Leukocytes, UA: NEGATIVE
Nitrite: NEGATIVE
Protein, ur: NEGATIVE mg/dL
SPECIFIC GRAVITY, URINE: 1.006 (ref 1.005–1.030)
pH: 5 (ref 5.0–8.0)

## 2016-10-23 LAB — LACTIC ACID, PLASMA
Lactic Acid, Venous: 1.1 mmol/L (ref 0.5–1.9)
Lactic Acid, Venous: 1.1 mmol/L (ref 0.5–1.9)

## 2016-10-23 LAB — TSH: TSH: 0.099 u[IU]/mL — AB (ref 0.350–4.500)

## 2016-10-23 SURGERY — COLONOSCOPY
Anesthesia: Moderate Sedation

## 2016-10-23 MED ORDER — LORAZEPAM 1 MG PO TABS
1.0000 mg | ORAL_TABLET | Freq: Every evening | ORAL | Status: DC | PRN
  Administered 2016-10-23: 1 mg via ORAL
  Filled 2016-10-23: qty 1

## 2016-10-23 MED ORDER — AMLODIPINE BESYLATE 5 MG PO TABS
5.0000 mg | ORAL_TABLET | Freq: Every day | ORAL | Status: DC
  Administered 2016-10-24: 5 mg via ORAL
  Filled 2016-10-23: qty 1

## 2016-10-23 MED ORDER — SODIUM CHLORIDE 0.9 % IV SOLN: 250.0000 mL | INTRAVENOUS | Status: DC | PRN

## 2016-10-23 MED ORDER — TAB-A-VITE/IRON PO TABS
1.0000 | ORAL_TABLET | Freq: Every day | ORAL | Status: DC
  Administered 2016-10-23 – 2016-10-24 (×2): 1 via ORAL
  Filled 2016-10-23 (×2): qty 1

## 2016-10-23 MED ORDER — IPRATROPIUM-ALBUTEROL 0.5-2.5 (3) MG/3ML IN SOLN
3.0000 mL | RESPIRATORY_TRACT | Status: DC
Start: 2016-10-23 — End: 2016-10-23
  Administered 2016-10-23 (×2): 3 mL via RESPIRATORY_TRACT
  Filled 2016-10-23 (×3): qty 3

## 2016-10-23 MED ORDER — SODIUM CHLORIDE 0.9% FLUSH: 3.0000 mL | INTRAVENOUS | Status: DC | PRN

## 2016-10-23 MED ORDER — ONDANSETRON HCL 4 MG/2ML IJ SOLN: 4.0000 mg | Freq: Four times a day (QID) | INTRAMUSCULAR | Status: DC | PRN

## 2016-10-23 MED ORDER — ROSUVASTATIN CALCIUM 10 MG PO TABS
5.0000 mg | ORAL_TABLET | Freq: Every day | ORAL | Status: DC
  Administered 2016-10-24: 5 mg via ORAL
  Filled 2016-10-23: qty 1

## 2016-10-23 MED ORDER — MEPERIDINE HCL 100 MG/ML IJ SOLN
INTRAMUSCULAR | Status: AC
  Filled 2016-10-23: qty 2

## 2016-10-23 MED ORDER — INSULIN GLARGINE 100 UNIT/ML ~~LOC~~ SOLN
18.0000 [IU] | Freq: Every day | SUBCUTANEOUS | Status: DC
  Administered 2016-10-23: 18 [IU] via SUBCUTANEOUS
  Filled 2016-10-23 (×2): qty 0.18

## 2016-10-23 MED ORDER — PANCRELIPASE (LIP-PROT-AMYL) 12000-38000 UNITS PO CPEP
36000.0000 [IU] | ORAL_CAPSULE | Freq: Three times a day (TID) | ORAL | Status: DC
  Administered 2016-10-23 – 2016-10-24 (×3): 36000 [IU] via ORAL
  Filled 2016-10-23 (×3): qty 3

## 2016-10-23 MED ORDER — ACETAMINOPHEN 325 MG PO TABS
650.0000 mg | ORAL_TABLET | ORAL | Status: DC | PRN
  Administered 2016-10-23: 650 mg via ORAL
  Filled 2016-10-23: qty 2

## 2016-10-23 MED ORDER — SODIUM CHLORIDE 0.9 % IV SOLN: INTRAVENOUS | Status: DC

## 2016-10-23 MED ORDER — METOPROLOL TARTRATE 25 MG PO TABS
25.0000 mg | ORAL_TABLET | Freq: Two times a day (BID) | ORAL | Status: DC
  Administered 2016-10-23 – 2016-10-24 (×2): 25 mg via ORAL
  Filled 2016-10-23 (×2): qty 1

## 2016-10-23 MED ORDER — ENOXAPARIN SODIUM 40 MG/0.4ML ~~LOC~~ SOLN
40.0000 mg | SUBCUTANEOUS | Status: DC
  Administered 2016-10-23: 40 mg via SUBCUTANEOUS
  Filled 2016-10-23 (×2): qty 0.4

## 2016-10-23 MED ORDER — MIDAZOLAM HCL 5 MG/5ML IJ SOLN
INTRAMUSCULAR | Status: AC
  Filled 2016-10-23: qty 10

## 2016-10-23 MED ORDER — ASPIRIN EC 81 MG PO TBEC
81.0000 mg | DELAYED_RELEASE_TABLET | Freq: Every day | ORAL | Status: DC
  Administered 2016-10-23 – 2016-10-24 (×2): 81 mg via ORAL
  Filled 2016-10-23 (×2): qty 1

## 2016-10-23 MED ORDER — SODIUM CHLORIDE 0.9% FLUSH
3.0000 mL | Freq: Two times a day (BID) | INTRAVENOUS | Status: DC
  Administered 2016-10-23 – 2016-10-24 (×2): 3 mL via INTRAVENOUS

## 2016-10-23 MED ORDER — FUROSEMIDE 10 MG/ML IJ SOLN
20.0000 mg | Freq: Once | INTRAMUSCULAR | Status: AC
  Administered 2016-10-23: 20 mg via INTRAVENOUS
  Filled 2016-10-23: qty 2

## 2016-10-23 MED ORDER — LISINOPRIL 5 MG PO TABS
2.5000 mg | ORAL_TABLET | Freq: Every day | ORAL | Status: DC
  Administered 2016-10-23 – 2016-10-24 (×2): 2.5 mg via ORAL
  Filled 2016-10-23 (×2): qty 1

## 2016-10-23 MED ORDER — CYCLOSPORINE 0.05 % OP EMUL
1.0000 [drp] | Freq: Three times a day (TID) | OPHTHALMIC | Status: DC
  Administered 2016-10-23 – 2016-10-24 (×2): 1 [drp] via OPHTHALMIC
  Filled 2016-10-23 (×3): qty 1

## 2016-10-23 MED ORDER — INSULIN ASPART 100 UNIT/ML ~~LOC~~ SOLN
0.0000 [IU] | Freq: Three times a day (TID) | SUBCUTANEOUS | Status: DC
  Administered 2016-10-23: 2 [IU] via SUBCUTANEOUS
  Administered 2016-10-24: 9 [IU] via SUBCUTANEOUS
  Administered 2016-10-24: 2 [IU] via SUBCUTANEOUS

## 2016-10-23 MED ORDER — FLUOXETINE HCL 20 MG PO TABS
20.0000 mg | ORAL_TABLET | Freq: Every day | ORAL | Status: DC
  Filled 2016-10-23: qty 1

## 2016-10-23 MED ORDER — ORAL CARE MOUTH RINSE
15.0000 mL | Freq: Two times a day (BID) | OROMUCOSAL | Status: DC
  Administered 2016-10-23: 15 mL via OROMUCOSAL

## 2016-10-23 MED ORDER — SIMETHICONE 80 MG PO CHEW: 80.0000 mg | CHEWABLE_TABLET | Freq: Two times a day (BID) | ORAL | Status: DC | PRN

## 2016-10-23 MED ORDER — FLUOXETINE HCL 20 MG PO CAPS
20.0000 mg | ORAL_CAPSULE | Freq: Every day | ORAL | Status: DC
  Administered 2016-10-24: 20 mg via ORAL
  Filled 2016-10-23: qty 1

## 2016-10-23 MED ORDER — ONDANSETRON HCL 4 MG/2ML IJ SOLN
INTRAMUSCULAR | Status: AC
  Filled 2016-10-23: qty 2

## 2016-10-23 MED ORDER — DOCUSATE SODIUM 100 MG PO CAPS: 100.0000 mg | ORAL_CAPSULE | Freq: Two times a day (BID) | ORAL | Status: DC | PRN

## 2016-10-23 MED ORDER — FUROSEMIDE 10 MG/ML IJ SOLN
40.0000 mg | Freq: Two times a day (BID) | INTRAMUSCULAR | Status: DC
  Administered 2016-10-23 – 2016-10-24 (×2): 40 mg via INTRAVENOUS
  Filled 2016-10-23 (×2): qty 4

## 2016-10-23 MED ORDER — PANTOPRAZOLE SODIUM 40 MG PO TBEC
40.0000 mg | DELAYED_RELEASE_TABLET | Freq: Every day | ORAL | Status: DC
Start: 2016-10-23 — End: 2016-10-24
  Administered 2016-10-23 – 2016-10-24 (×2): 40 mg via ORAL
  Filled 2016-10-23 (×2): qty 1

## 2016-10-23 NOTE — H&P (Signed)
History and Physical    ADRA SHEPLER WTU:[id number] DOB: 05/06/1926 DOA: 10/23/2016  PCP: Susy Frizzle, MD  Patient coming from: home  I have personally briefly reviewed patient's old medical records in [place]  Chief Complaint: Low oxygen saturations  HPI: Misty Edwards is a 83 y.o. female with medical history significant of hypertension, diabetes, chronic pancreatitis, had come to the hospital earlier this morning to undergo elective EGD and colonoscopy for heme positive stools. On arrival to preop, she complained of some dizziness and intermittent dimming of her vision. Vitals were checked and she was noted to be significantly hypoxic, with sats in the mid 80s on room air. Supplemental oxygen was applied with improvement of oxygen saturations. Blood sugar was noted to be 158. She denied any chest pain but does report intermittent dyspepsia. She has not particularly felt short of breath. He's not had any cough or fever. No nausea or vomiting. She reports that her weight has been steady. Since she had abnormal vitals, she was sent to the emergency room for evaluation.  ED Course: She was noted to be hypoxic in the emergency room. She was also hypertensive. Chest x-ray indicated interstitial edema. BNP was elevated at 800. EKG and troponin were negative. Patient has been referred for admission.  Review of Systems: As per HPI otherwise 10 point review of systems negative.    Past Medical History:  Diagnosis Date  . Acute biliary pancreatitis 07/2002   thia was in 07/2004:she still has pseudocyst in tail of pancreas measuring 54 x 33 mm   . Adrenal adenoma    bilateral  . Anxiety   . Chronic pancreatitis ([place])   . Detached retina   . Diabetes mellitus ([place])   . Diverticulosis   . History of carcinoma in situ of breast 1988  . Hyperlipidemia   . Hypertension   . IBS (irritable bowel syndrome)   . Legally blind   . Osteoarthritis   . Osteopenia   . Upper GI bleed  September2004   secondary to gastritis    Past Surgical History:  Procedure Laterality Date  . CARDIAC CATHETERIZATION N/A 08/24/2014   Procedure: Left Heart Cath and Coronary Angiography;  Surgeon: Leonie Man, MD;  Location: [facility];  Service: Cardiovascular;  Laterality: N/A;  . COLONOSCOPY  08/15/05   few tiny diverticula at sigmoid colon/external hemorrhoids but no polyps  . COLONOSCOPY  01/08/2012   ZPH:[id number] diverticulosis. Next colonoscopy in 12/2016  . CORONARY ARTERY BYPASS GRAFT N/A 08/24/2014   Procedure: CORONARY ARTERY BYPASS GRAFTING (CABG) x three, using left internal mammary artery and right leg greater saphenous vein harvested endoscopically;  Surgeon: Ivin Poot, MD;  Location: [facility];  Service: Open Heart Surgery;  Laterality: N/A;  . ECTOPIC PREGNANCY SURGERY  1950's  . ERCP with sphincterotomy  07/2002  . ESOPHAGOGASTRODUODENOSCOPY      Gastritis of body.  Otherwise normal  . ESOPHAGOGASTRODUODENOSCOPY  01/08/2012   RMR: Few scattered gastric erosions of uncertain significance-status post biopsy. Minimal chronic inflammation, no H.pylori  . LAPAROSCOPIC CHOLECYSTECTOMY    . PANCREATIC PSEUDOCYST DRAINAGE  [id number]   drained percutaneously   . right mastectomy    . tacking up of her bladder    . TEE WITHOUT CARDIOVERSION N/A 08/24/2014   Procedure: TRANSESOPHAGEAL ECHOCARDIOGRAM (TEE);  Surgeon: Ivin Poot, MD;  Location: [facility];  Service: Open Heart Surgery;  Laterality: N/A;     reports that she has never smoked. She  has never used smokeless tobacco. She reports that she does not drink alcohol or use drugs.  Allergies  Allergen Reactions  . Calcium-Containing Compounds Nausea Only  . Morphine And Related Other (See Comments)    Hallucination  . Raloxifene Itching    Evista- Face and eyes burning  . Vitamin D Analogs Nausea Only    Family History  Problem Relation Age of Onset  . Ovarian cancer Mother        passed  . Colon cancer  Father 33       passed away in his 52's  . Colon cancer Brother 51       surgery inhis 50's doing well now  . Heart attack Brother        passed away age 23  . Pancreatitis Brother   . Kidney disease Daughter   . Leukemia Son      Prior to Admission medications   Medication Sig Start Date End Date Taking? Authorizing Provider  amLODipine (NORVASC) 5 MG tablet TAKE ONE TABLET BY MOUTH ONCE DAILY 05/05/16  Yes Erlene Quan, PA-C  aspirin EC 81 MG EC tablet Take 1 tablet (81 mg total) by mouth daily. 08/31/14  Yes Gold, Wayne E, PA-C  cetirizine (ZYRTEC) 10 MG tablet TAKE ONE TABLET BY MOUTH ONCE DAILY 07/30/16  Yes Susy Frizzle, MD  CREON 24000 units CPEP TAKE TWO CAPSULES BY MOUTH WITH MEALS AND  ONE  CAPSULE  WITH  SNACKS 11/09/15  Yes Susy Frizzle, MD  cycloSPORINE (RESTASIS) 0.05 % ophthalmic emulsion Place 1 drop into both eyes 3 (three) times daily.    Yes [provider]  diphenhydrAMINE (BENADRYL) 25 mg capsule Take 25 mg by mouth daily as needed for allergies.   Yes [provider]  docusate sodium (COLACE) 100 MG capsule Take 1 capsule (100 mg total) by mouth 2 (two) times daily as needed for mild constipation. 10/19/14  Yes Susy Frizzle, MD  FLUoxetine (PROZAC) 20 MG tablet TAKE ONE TABLET BY MOUTH ONCE DAILY Patient taking differently: TAKE ONE TABLET BY MOUTH ONCE DAILY AT BEDTIME 02/06/16  Yes Susy Frizzle, MD  fluticasone [facility]) 50 MCG/ACT nasal spray Place into both nostrils daily as needed for allergies or rhinitis.   Yes [provider]  furosemide (LASIX) 20 MG tablet TAKE ONE TABLET BY MOUTH ONCE DAILY Patient taking differently: TAKE ONE TABLET BY MOUTH ONCE DAILY AS NEEDED FOR FLUID 05/19/16  Yes Pickard, Cammie Mcgee, MD  GLUCERNA (GLUCERNA) LIQD Take 237 mLs by mouth daily as needed (takes if sugar is low).   Yes [provider]  hydrALAZINE (APRESOLINE) 10 MG tablet Two Times Daily As Needed for Systolic BP above  161 Patient taking differently: 10 mg as needed. Two Times Daily As Needed for Systolic BP above 096 04/54/09  Yes Kilroy, Luke K, PA-C  insulin aspart (NOVOLOG) 100 UNIT/ML injection Pt uses sliding scale - E11.65 Patient taking differently: Inject 2 Units into the skin 3 (three) times daily with meals. Pt uses sliding scale - E11.65 09/29/16  Yes Pickard, Cammie Mcgee, MD  insulin glargine (LANTUS) 100 UNIT/ML injection Inject 0.25 mLs (25 Units total) into the skin every morning. Patient taking differently: Inject 18 Units into the skin daily.  08/12/16  Yes Susy Frizzle, MD  Insulin Syringes, Disposable, U-100 1 ML MISC 1/2 inch 31 g 06/22/15  Yes Susy Frizzle, MD  lactose free nutrition (BOOST) LIQD Take 237 mLs by mouth daily as  needed (takes if sugar is really low).   Yes [provider]  LORazepam (ATIVAN) 0.5 MG tablet TAKE 1 TO 2 TABLETS BY MOUTH ONCE DAILY AT BEDTIME AS NEEDED FOR INSOMNIA Patient taking differently: TAKE 2 TABLETS BY MOUTH ONCE DAILY AT BEDTIME AS NEEDED FOR INSOMNIA 09/18/16  Yes Susy Frizzle, MD  metoprolol tartrate (LOPRESSOR) 25 MG tablet TAKE ONE TABLET BY MOUTH TWICE DAILY 05/05/16  Yes Susy Frizzle, MD  Multiple Vitamin (MULTIVITAMIN) capsule Take 1 capsule by mouth daily.   Yes [provider]  omeprazole (PRILOSEC) 40 MG capsule TAKE ONE CAPSULE BY MOUTH ONCE DAILY 07/30/16  Yes Susy Frizzle, MD  ONE TOUCH ULTRA TEST test strip USE TO TEST UP TO 5 TIMES DAILY AS DIRECTED. 09/02/16  Yes Pickard, Cammie Mcgee, MD  Pancrelipase, Lip-Prot-Amyl, (CREON) 24000-76000 units CPEP Take 1-2 capsules by mouth See admin instructions. Takes 2 caps 3 times daily with meals and 1 with snacks   Yes [provider]  potassium chloride (K-DUR) 10 MEQ tablet TAKE ONE TABLET BY MOUTH ONCE DAILY Patient taking differently: TAKE ONE TABLET BY MOUTH ONCE DAILY AS NEEDED TAKES WITH FUROSEMIDE 05/19/16  Yes Susy Frizzle, MD  promethazine (PHENERGAN)  12.5 MG tablet Take 0.5-1 tablets (6.25-12.5 mg total) by mouth every 6 (six) hours as needed for nausea or vomiting. 09/28/16  Yes Patrecia Pour, Christean Grief, MD  rosuvastatin (CRESTOR) 5 MG tablet TAKE ONE TABLET BY MOUTH ONCE DAILY Patient taking differently: TAKE ONE TABLET BY MOUTH ONCE DAILY AT BEDTIME 05/02/16  Yes Kilroy, Luke K, PA-C  Simethicone (GAS-X EXTRA STRENGTH) 125 MG CAPS Take 125 mg by mouth 2 (two) times daily as needed (GAS).   Yes [provider]  triamcinolone cream (KENALOG) 0.1 % Apply 1 application topically 2 (two) times daily. Patient taking differently: Apply 1 application topically 2 (two) times daily as needed (DRY SKIN).  07/22/16  Yes Susy Frizzle, MD  acetaminophen (TYLENOL) 500 MG tablet Take 500-1,000 mg by mouth 3 (three) times daily as needed for mild pain. Pain.     [provider]  ferrous sulfate 325 (65 FE) MG tablet Take 325 mg by mouth daily with breakfast.    [provider]  oxyCODONE-acetaminophen (PERCOCET/ROXICET) 5-325 MG tablet Take 1 tablet by mouth every 8 (eight) hours as needed for moderate pain. Patient not taking: Reported on 10/22/2016 09/28/16   Patrecia Pour, Christean Grief, MD  peg 3350 powder (MOVIPREP) 100 g SOLR Take 1 kit (200 g total) by mouth as directed. Patient not taking: Reported on 10/23/2016 09/10/16   Daneil Dolin, MD  tamsulosin (FLOMAX) 0.4 MG CAPS capsule Take 1 capsule (0.4 mg total) by mouth daily. Patient not taking: Reported on 10/22/2016 09/29/16   Patrecia Pour, Christean Grief, MD    Physical Exam: Vitals:   10/23/16 1300 10/23/16 1315 10/23/16 1330 10/23/16 1358  BP: (!) 165/55  (!) 169/64 (!) 177/58  Pulse: 68 65 70 72  Resp: (!) 30 (!) 25 (!) 29 18  Temp:    98.1 F (36.7 C)  TempSrc:    Oral  SpO2: 91% 94% 94% 97%  Weight:    64.9 kg (143 lb)  Height:    _0  (1.6 m)    Constitutional: NAD, calm, comfortable Vitals:   10/23/16 1300 10/23/16 1315 10/23/16 1330 10/23/16 1358  BP: (!) 165/55  (!) 169/64  (!) 177/58  Pulse: 68 65 70 72  Resp: (!) 30 (!) 25 (!) 29 18  Temp:    98.1 F (36.7 C)  TempSrc:    Oral  SpO2: 91% 94% 94% 97%  Weight:    64.9 kg (143 lb)  Height:    _0  (1.6 m)   Eyes: PERRL, lids and conjunctivae normal ENMT: Mucous membranes are moist. Posterior pharynx clear of any exudate or lesions.Normal dentition.  Neck: normal, supple, no masses, no thyromegaly Respiratory: Crackles at bases. Normal respiratory effort. No accessory muscle use.  Cardiovascular: Regular rate and rhythm, no murmurs / rubs / gallops. Elevated JVD. Trace extremity edema. 2+ pedal pulses. No carotid bruits.  Abdomen: no tenderness, no masses palpated. No hepatosplenomegaly. Bowel sounds positive.  Musculoskeletal: no clubbing / cyanosis. No joint deformity upper and lower extremities. Good ROM, no contractures. Normal muscle tone.  Skin: no rashes, lesions, ulcers. No induration Neurologic: CN 2-12 grossly intact. Sensation intact, DTR normal. Strength 5/5 in all 4.  Psychiatric: Normal judgment and insight. Alert and oriented x 3. Normal mood.   Labs on Admission: I have personally reviewed following labs and imaging studies  CBC:  Recent Labs Lab 10/23/16 1112  WBC 7.1  NEUTROABS 4.8  HGB 11.6*  HCT 35.5*  MCV 83.3  PLT 161   Basic Metabolic Panel:  Recent Labs Lab 10/23/16 1112  NA 131*  K 4.1  CL 100*  CO2 21*  GLUCOSE 179*  BUN 8  CREATININE 0.70  CALCIUM 9.2   GFR: Estimated Creatinine Clearance: 44.9 mL/min (by C-G formula based on SCr of 0.7 mg/dL). Liver Function Tests:  Recent Labs Lab 10/23/16 1112  AST 28  ALT 24  ALKPHOS 62  BILITOT 1.2  PROT 7.7  ALBUMIN 4.2   No results for input(s): LIPASE, AMYLASE in the last 168 hours. No results for input(s): AMMONIA in the last 168 hours. Coagulation Profile: No results for input(s): INR, PROTIME in the last 168 hours. Cardiac Enzymes:  Recent Labs Lab 10/23/16 1112  TROPONINI <0.03   BNP (last 3  results) No results for input(s): PROBNP in the last 8760 hours. HbA1C: No results for input(s): HGBA1C in the last 72 hours. CBG:  Recent Labs Lab 10/23/16 0958  GLUCAP 152*   Lipid Profile: No results for input(s): CHOL, HDL, LDLCALC, TRIG, CHOLHDL, LDLDIRECT in the last 72 hours. Thyroid Function Tests: No results for input(s): TSH, T4TOTAL, FREET4, T3FREE, THYROIDAB in the last 72 hours. Anemia Panel: No results for input(s): VITAMINB12, FOLATE, FERRITIN, TIBC, IRON, RETICCTPCT in the last 72 hours. Urine analysis:    Component Value Date/Time   COLORURINE STRAW (A) 10/23/2016 1112   APPEARANCEUR CLEAR 10/23/2016 1112   LABSPEC 1.006 10/23/2016 1112   PHURINE 5.0 10/23/2016 1112   GLUCOSEU 50 (A) 10/23/2016 1112   HGBUR NEGATIVE 10/23/2016 1112   BILIRUBINUR NEGATIVE 10/23/2016 1112   KETONESUR NEGATIVE 10/23/2016 1112   PROTEINUR NEGATIVE 10/23/2016 1112   UROBILINOGEN 0.2 09/02/2014 1445   NITRITE NEGATIVE 10/23/2016 1112   LEUKOCYTESUR NEGATIVE 10/23/2016 1112    Radiological Exams on Admission: Dg Chest Portable 1 View  Result Date: 10/23/2016 CLINICAL DATA:  Dizziness since Monday. EXAM: PORTABLE CHEST 1 VIEW COMPARISON:  09/26/2016 FINDINGS: The heart is normal in size and stable. Mild tortuosity of the thoracic aorta. Stable surgical changes from coronary artery bypass surgery. There is a diffuse but asymmetric and fairly pronounced interstitial process, right lung greater than left. There are also small pleural effusions. This is most likely asymmetric pulmonary edema or possibly some type of severe interstitial pneumonitis. The bony  structures are intact. IMPRESSION: Asymmetric interstitial process, likely pulmonary edema with small pleural effusions. Electronically Signed   By: Marijo Sanes M.D.   On: 10/23/2016 11:42    EKG: Independently reviewed. Sinus rhythm without acute changes  Assessment/Plan Active Problems:   Essential hypertension   Chronic  pancreatitis ([facility])   Type 2 diabetes mellitus with vascular disease ([facility])   Heme positive stool   CHF exacerbation ([facility])   Acute respiratory failure with hypoxia ([place])     1. Acute respiratory failure with hypoxia. Likely related to decompensated CHF. Wean off oxygen as tolerated. 2. Acute on chronic diastolic CHF exacerbation. Patient takes Lasix at home as needed for fluid. We will continue her on IV Lasix at this time. Update echocardiogram. Continue on beta blockers. Add low-dose ACE inhibitor. Monitor intake and output. 3. Diabetes. Continue on Lantus and sliding scale insulin. Monitor blood sugars. 4. Chronic pancreatitis. Continue Creon with meals. 5. Hypertension. Continue home regimen of beta blockers and amlodipine.  DVT prophylaxis: lovenox Code Status: full code Family Communication: discussed with daughter at the bedside Disposition Plan: discharge home once improved Consults called:  Admission status: inpatient, telemetry   [facility] MD [facility] Pager [phone]  If 7PM-7AM, please contact night-coverage [email or web address] Password [facility]  10/23/2016, 4:43 PM

## 2016-10-23 NOTE — ED Notes (Signed)
Per pt. Started seeing cloudiness and darkness to both eyes started Monday that is intermittent. Pt denies now and denies dizziness now. A/o

## 2016-10-23 NOTE — Progress Notes (Signed)
Patient evaluated in preop. Has been dizzy and short of breath recently. Prepped for colonoscopy and EGD today. Sats in the mid 80s on room air; up to 90 on 4 L nasal cannula. Feels short of breath with oxygen removed. Blood sugar 158 this  morning. Has sight in 1 eye at baseline which she states has  diminished significantly  over the past week. Looks to be in mild distress baseline. Given her change in status recently, will go ahead and cancel EGD and colonoscopy for today. In fact, I think she ought to go to the ED and have further evaluation semi-urgently. This is been discussed with patient and daughter at length. They're in agreement.

## 2016-10-23 NOTE — Progress Notes (Signed)
Dr. Gala Romney evaluate the patient after reporting to him that she was complaing of dizziness SOB and eye sight changes. Her O2 sat was 85 on room air and only 92 on 3 L and then dropped back which then had to increase to 4 L to maintain her at 91. The case was canceled by Dr. Gala Romney after evaluating the pateint and she was transported to the ER with her daughter

## 2016-10-23 NOTE — ED Provider Notes (Signed)
[facility] DEPT Provider Note   CSN: [id number] Arrival date & time: 10/23/16  1054     History   Chief Complaint Chief Complaint  Patient presents with  . Dizziness    HPI Misty Edwards is a 83 y.o. female.  HPI   83 year old female without any acute complaints aside from lightheadedness described as "swimmingness". She was at the surgery center to get a colonoscopy and EGD done when they found her to be hypoxic on normal routine vital signs. She has not had any cough, fever, shortness of breath prior to this. They started on oxygen and sent her here for further evaluation. Here she is complaining of some cough but that did not start of oxygen. No history of smoking, asthma, COPD. Did a recent hospitalization for right nephrolithiasis and dehydration. Also has a pulmonary edema and takes Lasix as needed if she's gaining weight or having lower extremity swelling. Has history of heart disease in the past. No recent travels or sick contacts. Does have some blurry vision in her good eye.  Past Medical History:  Diagnosis Date  . Acute biliary pancreatitis 07/2002   thia was in 07/2004:she still has pseudocyst in tail of pancreas measuring 54 x 33 mm   . Adrenal adenoma    bilateral  . Anxiety   . Chronic pancreatitis ([place])   . Detached retina   . Diabetes mellitus ([place])   . Diverticulosis   . History of carcinoma in situ of breast 1988  . Hyperlipidemia   . Hypertension   . IBS (irritable bowel syndrome)   . Legally blind   . Osteoarthritis   . Osteopenia   . Upper GI bleed September2004   secondary to gastritis    Patient Active Problem List   Diagnosis Date Noted  . CHF exacerbation ([place]) 10/23/2016  . Nephrolithiasis 09/27/2016  . Hydronephrosis with renal and ureteral calculus obstruction 09/27/2016  . Hydronephrosis 09/27/2016  . Heme positive stool 09/05/2016  . Dyslipidemia 03/07/2015  . Type 2 diabetes mellitus with vascular disease ([place]) 03/07/2015  .  Anemia 09/26/2014  . Lesion of lip 09/25/2014  . S/P CABG x 3 08/25/2014  . NSTEMI (non-ST elevated myocardial infarction) ([place]) 08/24/2014  . Acute coronary syndrome ([place])   . IBS (irritable bowel syndrome) 08/26/2012  . Thrombocytopenia ([place]) 02/02/2012  . Epigastric pain 02/02/2012  . Gastritis 02/02/2012  . Abdominal pain 12/17/2011  . Normocytic anemia 12/17/2011  . History of colon polyps 12/17/2011  . Constipation 12/17/2011  . Family history of colon cancer 08/22/2011  . LLQ pain 08/21/2011  . CHRONIC PANCREATITIS 09/12/2008  . HELICOBACTER PYLORI INFECTION 01/26/2008  . ADENOCARCINOMA, BREAST 01/26/2008  . Essential hypertension 01/26/2008  . External hemorrhoids 01/26/2008  . GERD 01/26/2008  . CONSTIPATION 01/26/2008  . PSEUDOCYST, PANCREAS 01/26/2008  . GALLSTONE PANCREATITIS 01/26/2008  . GI BLEEDING 01/26/2008  . UTI 01/26/2008  . NAUSEA 01/26/2008  . VOMITING 01/26/2008  . DIARRHEA 01/26/2008    Past Surgical History:  Procedure Laterality Date  . CARDIAC CATHETERIZATION N/A 08/24/2014   Procedure: Left Heart Cath and Coronary Angiography;  Surgeon: Leonie Man, MD;  Location: [facility];  Service: Cardiovascular;  Laterality: N/A;  . COLONOSCOPY  08/15/05   few tiny diverticula at sigmoid colon/external hemorrhoids but no polyps  . COLONOSCOPY  01/08/2012   [id number] diverticulosis. Next colonoscopy in 12/2016  . CORONARY ARTERY BYPASS GRAFT N/A 08/24/2014   Procedure: CORONARY ARTERY BYPASS GRAFTING (CABG) x three, using left internal  mammary artery and right leg greater saphenous vein harvested endoscopically;  Surgeon: Ivin Poot, MD;  Location: [facility];  Service: Open Heart Surgery;  Laterality: N/A;  . ECTOPIC PREGNANCY SURGERY  1950's  . ERCP with sphincterotomy  07/2002  . ESOPHAGOGASTRODUODENOSCOPY      Gastritis of body.  Otherwise normal  . ESOPHAGOGASTRODUODENOSCOPY  01/08/2012   RMR: Few scattered gastric erosions of uncertain  significance-status post biopsy. Minimal chronic inflammation, no H.pylori  . LAPAROSCOPIC CHOLECYSTECTOMY    . PANCREATIC PSEUDOCYST DRAINAGE  [id number]   drained percutaneously   . right mastectomy    . tacking up of her bladder    . TEE WITHOUT CARDIOVERSION N/A 08/24/2014   Procedure: TRANSESOPHAGEAL ECHOCARDIOGRAM (TEE);  Surgeon: Ivin Poot, MD;  Location: [facility];  Service: Open Heart Surgery;  Laterality: N/A;    OB History    Gravida Para Term Preterm AB Living             5   SAB TAB Ectopic Multiple Live Births                   Home Medications    Prior to Admission medications   Medication Sig Start Date End Date Taking? Authorizing Provider  amLODipine (NORVASC) 5 MG tablet TAKE ONE TABLET BY MOUTH ONCE DAILY 05/05/16  Yes Erlene Quan, PA-C  aspirin EC 81 MG EC tablet Take 1 tablet (81 mg total) by mouth daily. 08/31/14  Yes Gold, Wayne E, PA-C  cetirizine (ZYRTEC) 10 MG tablet TAKE ONE TABLET BY MOUTH ONCE DAILY 07/30/16  Yes Susy Frizzle, MD  CREON 24000 units CPEP TAKE TWO CAPSULES BY MOUTH WITH MEALS AND  ONE  CAPSULE  WITH  SNACKS 11/09/15  Yes Susy Frizzle, MD  cycloSPORINE (RESTASIS) 0.05 % ophthalmic emulsion Place 1 drop into both eyes 3 (three) times daily.    Yes [provider]  diphenhydrAMINE (BENADRYL) 25 mg capsule Take 25 mg by mouth daily as needed for allergies.   Yes [provider]  docusate sodium (COLACE) 100 MG capsule Take 1 capsule (100 mg total) by mouth 2 (two) times daily as needed for mild constipation. 10/19/14  Yes Susy Frizzle, MD  FLUoxetine (PROZAC) 20 MG tablet TAKE ONE TABLET BY MOUTH ONCE DAILY Patient taking differently: TAKE ONE TABLET BY MOUTH ONCE DAILY AT BEDTIME 02/06/16  Yes Susy Frizzle, MD  fluticasone [facility]) 50 MCG/ACT nasal spray Place into both nostrils daily as needed for allergies or rhinitis.   Yes [provider]  furosemide (LASIX) 20 MG tablet TAKE ONE TABLET BY MOUTH  ONCE DAILY Patient taking differently: TAKE ONE TABLET BY MOUTH ONCE DAILY AS NEEDED FOR FLUID 05/19/16  Yes Pickard, Cammie Mcgee, MD  GLUCERNA (GLUCERNA) LIQD Take 237 mLs by mouth daily as needed (takes if sugar is low).   Yes [provider]  hydrALAZINE (APRESOLINE) 10 MG tablet Two Times Daily As Needed for Systolic BP above 850 Patient taking differently: 10 mg as needed. Two Times Daily As Needed for Systolic BP above 277 41/28/78  Yes Kilroy, Luke K, PA-C  insulin aspart (NOVOLOG) 100 UNIT/ML injection Pt uses sliding scale - E11.65 Patient taking differently: Inject 2 Units into the skin 3 (three) times daily with meals. Pt uses sliding scale - E11.65 09/29/16  Yes Pickard, Cammie Mcgee, MD  insulin glargine (LANTUS) 100 UNIT/ML injection Inject 0.25 mLs (25 Units total) into the skin every morning. Patient taking differently:  Inject 18 Units into the skin daily.  08/12/16  Yes Susy Frizzle, MD  Insulin Syringes, Disposable, U-100 1 ML MISC 1/2 inch 31 g 06/22/15  Yes Susy Frizzle, MD  lactose free nutrition (BOOST) LIQD Take 237 mLs by mouth daily as needed (takes if sugar is really low).   Yes [provider]  LORazepam (ATIVAN) 0.5 MG tablet TAKE 1 TO 2 TABLETS BY MOUTH ONCE DAILY AT BEDTIME AS NEEDED FOR INSOMNIA Patient taking differently: TAKE 2 TABLETS BY MOUTH ONCE DAILY AT BEDTIME AS NEEDED FOR INSOMNIA 09/18/16  Yes Susy Frizzle, MD  metoprolol tartrate (LOPRESSOR) 25 MG tablet TAKE ONE TABLET BY MOUTH TWICE DAILY 05/05/16  Yes Susy Frizzle, MD  Multiple Vitamin (MULTIVITAMIN) capsule Take 1 capsule by mouth daily.   Yes [provider]  nitrofurantoin (MACRODANTIN) 100 MG capsule Take 1 capsule by mouth 2 (two) times daily. 10/14/16  Yes [provider]  omeprazole (PRILOSEC) 40 MG capsule TAKE ONE CAPSULE BY MOUTH ONCE DAILY 07/30/16  Yes Susy Frizzle, MD  ONE TOUCH ULTRA TEST test strip USE TO TEST UP TO 5 TIMES DAILY AS DIRECTED.  09/02/16  Yes Pickard, Cammie Mcgee, MD  Pancrelipase, Lip-Prot-Amyl, (CREON) 24000-76000 units CPEP Take 1-2 capsules by mouth See admin instructions. Takes 2 caps 3 times daily with meals and 1 with snacks   Yes [provider]  potassium chloride (K-DUR) 10 MEQ tablet TAKE ONE TABLET BY MOUTH ONCE DAILY Patient taking differently: TAKE ONE TABLET BY MOUTH ONCE DAILY AS NEEDED TAKES WITH FUROSEMIDE 05/19/16  Yes Susy Frizzle, MD  promethazine (PHENERGAN) 12.5 MG tablet Take 0.5-1 tablets (6.25-12.5 mg total) by mouth every 6 (six) hours as needed for nausea or vomiting. 09/28/16  Yes Patrecia Pour, Christean Grief, MD  rosuvastatin (CRESTOR) 5 MG tablet TAKE ONE TABLET BY MOUTH ONCE DAILY Patient taking differently: TAKE ONE TABLET BY MOUTH ONCE DAILY AT BEDTIME 05/02/16  Yes Kilroy, Luke K, PA-C  Simethicone (GAS-X EXTRA STRENGTH) 125 MG CAPS Take 125 mg by mouth 2 (two) times daily as needed (GAS).   Yes [provider]  triamcinolone cream (KENALOG) 0.1 % Apply 1 application topically 2 (two) times daily. Patient taking differently: Apply 1 application topically 2 (two) times daily as needed (DRY SKIN).  07/22/16  Yes Susy Frizzle, MD  acetaminophen (TYLENOL) 500 MG tablet Take 500-1,000 mg by mouth 3 (three) times daily as needed for mild pain. Pain.     [provider]  ferrous sulfate 325 (65 FE) MG tablet Take 325 mg by mouth daily with breakfast.    [provider]  oxyCODONE-acetaminophen (PERCOCET/ROXICET) 5-325 MG tablet Take 1 tablet by mouth every 8 (eight) hours as needed for moderate pain. Patient not taking: Reported on 10/22/2016 09/28/16   Patrecia Pour, Christean Grief, MD  peg 3350 powder (MOVIPREP) 100 g SOLR Take 1 kit (200 g total) by mouth as directed. Patient not taking: Reported on 10/23/2016 09/10/16   Daneil Dolin, MD  tamsulosin (FLOMAX) 0.4 MG CAPS capsule Take 1 capsule (0.4 mg total) by mouth daily. Patient not taking: Reported on 10/22/2016 09/29/16   Doreatha Lew, MD    Family History Family History  Problem Relation Age of Onset  . Ovarian cancer Mother        passed  . Colon cancer Father 75       passed away in his 60's  . Colon cancer Brother 39  surgery inhis 50's doing well now  . Heart attack Brother        passed away age 49  . Pancreatitis Brother   . Kidney disease Daughter   . Leukemia Son     Social History Social History  Substance Use Topics  . Smoking status: Never Smoker  . Smokeless tobacco: Never Used  . Alcohol use No     Allergies   Calcium-containing compounds; Morphine and related; Raloxifene; and Vitamin d analogs   Review of Systems Review of Systems  Constitutional: Negative for chills and fever.  Respiratory: Positive for shortness of breath. Negative for cough.   Cardiovascular: Positive for leg swelling (R>L). Negative for chest pain.  All other systems reviewed and are negative.    Physical Exam Updated Vital Signs BP (!) 177/58 (BP Location: Right Arm)   Pulse 72   Temp 98.1 F (36.7 C) (Oral)   Resp 18   Ht '5\' 3"'  (1.6 m)   Wt 64.9 kg (143 lb)   SpO2 97%   BMI 25.33 kg/m   Physical Exam  Constitutional: She appears well-developed and well-nourished.  HENT:  Head: Normocephalic and atraumatic.  Eyes: Pupils are equal, round, and reactive to light. Conjunctivae and EOM are normal.  Neck: Normal range of motion.  Cardiovascular: Normal rate and regular rhythm.   Pulmonary/Chest: No stridor. She is in respiratory distress. She has wheezes.  Abdominal: Soft. She exhibits no distension.  Musculoskeletal: She exhibits edema (mild in ankles).  Neurological: She is alert. She displays normal reflexes. A cranial nerve deficit (decreased eyesight in R>L eye) is present.  Skin: Skin is warm and dry. No erythema. No pallor.  Nursing note and vitals reviewed.    ED Treatments / Results  Labs (all labs ordered are listed, but only abnormal results are displayed) Labs  Reviewed  CBC WITH DIFFERENTIAL/PLATELET - Abnormal; Notable for the following:       Result Value   Hemoglobin 11.6 (*)    HCT 35.5 (*)    RDW 16.0 (*)    All other components within normal limits  COMPREHENSIVE METABOLIC PANEL - Abnormal; Notable for the following:    Sodium 131 (*)    Chloride 100 (*)    CO2 21 (*)    Glucose, Bld 179 (*)    All other components within normal limits  URINALYSIS, ROUTINE W REFLEX MICROSCOPIC - Abnormal; Notable for the following:    Color, Urine STRAW (*)    Glucose, UA 50 (*)    All other components within normal limits  BRAIN NATRIURETIC PEPTIDE - Abnormal; Notable for the following:    B Natriuretic Peptide 848.0 (*)    All other components within normal limits  BLOOD GAS, VENOUS - Abnormal; Notable for the following:    pCO2, Ven 41.0 (*)    pO2, Ven 89.3 (*)    Acid-base deficit 5.1 (*)    All other components within normal limits  TROPONIN I  LACTIC ACID, PLASMA  LACTIC ACID, PLASMA    EKG  EKG Interpretation  Date/Time:  Thursday October 23 2016 12:16:16 EDT Ventricular Rate:  61 PR Interval:    QRS Duration: 97 QT Interval:  442 QTC Calculation: 446 R Axis:   45 Text Interpretation:  Sinus rhythm Anteroseptal infarct, old No significant change since last tracing Confirmed by Merrily Pew [phone]) on 10/23/2016 12:21:24 PM       Radiology Dg Chest Portable 1 View  Result Date: 10/23/2016 CLINICAL DATA:  Dizziness  since Monday. EXAM: PORTABLE CHEST 1 VIEW COMPARISON:  09/26/2016 FINDINGS: The heart is normal in size and stable. Mild tortuosity of the thoracic aorta. Stable surgical changes from coronary artery bypass surgery. There is a diffuse but asymmetric and fairly pronounced interstitial process, right lung greater than left. There are also small pleural effusions. This is most likely asymmetric pulmonary edema or possibly some type of severe interstitial pneumonitis. The bony structures are intact. IMPRESSION: Asymmetric  interstitial process, likely pulmonary edema with small pleural effusions. Electronically Signed   By: Marijo Sanes M.D.   On: 10/23/2016 11:42    Procedures Procedures (including critical care time)  CRITICAL CARE Performed by: Merrily Pew Total critical care time: 35 minutes Critical care time was exclusive of separately billable procedures and treating other patients. Critical care was necessary to treat or prevent imminent or life-threatening deterioration. Critical care was time spent personally by me on the following activities: development of treatment plan with patient and/or surrogate as well as nursing, discussions with consultants, evaluation of patient's response to treatment, examination of patient, obtaining history from patient or surrogate, ordering and performing treatments and interventions, ordering and review of laboratory studies, ordering and review of radiographic studies, pulse oximetry and re-evaluation of patient's condition.   Medications Ordered in ED Medications  ipratropium-albuterol (DUONEB) 0.5-2.5 (3) MG/3ML nebulizer solution 3 mL (3 mLs Nebulization Given 10/23/16 1132)  furosemide (LASIX) injection 20 mg (20 mg Intravenous Given 10/23/16 1234)     Initial Impression / Assessment and Plan / ED Course  I have reviewed the triage vital signs and the nursing notes.  Pertinent labs & imaging results that were available during my care of the patient were reviewed by me and considered in my medical decision making (see chart for details).    Pulmonary edema - wheeze, mild crackles with high BP? Will await XR, trop, BNP HCAP? - no fevers or cough, will hold on abx until xr. PE? Recent hospitalization, h/o asymmetric leg swelling, if no other obvious cause for symptoms found then will CT. Bronchitis? Unlikely without cough, fever, etc. ACS? No other symptoms for CP, will get ecg/trop  S/s c/w likely pulmonary edema. Started on diuretic IV, will admit 2/2 new  o2 requirement.   Final Clinical Impressions(s) / ED Diagnoses   Final diagnoses:  Acute respiratory failure with hypoxia ([place])      Takayla Baillie, Corene Cornea, MD 10/23/16 1609

## 2016-10-23 NOTE — ED Triage Notes (Signed)
Pt in day surgery for egd  And colonsocopy. Brought to ED prior to procedure due to 85% ra.  bline in left chronic. Pt tole OR staff that right eye vision changes and dizziness since Monday. A/o

## 2016-10-24 ENCOUNTER — Inpatient Hospital Stay (HOSPITAL_COMMUNITY): Payer: MEDICARE

## 2016-10-24 DIAGNOSIS — I340 Nonrheumatic mitral (valve) insufficiency: Secondary | ICD-10-CM | POA: Diagnosis not present

## 2016-10-24 DIAGNOSIS — J9601 Acute respiratory failure with hypoxia: Secondary | ICD-10-CM | POA: Diagnosis not present

## 2016-10-24 DIAGNOSIS — I5033 Acute on chronic diastolic (congestive) heart failure: Secondary | ICD-10-CM | POA: Diagnosis not present

## 2016-10-24 DIAGNOSIS — K861 Other chronic pancreatitis: Secondary | ICD-10-CM | POA: Diagnosis not present

## 2016-10-24 DIAGNOSIS — I10 Essential (primary) hypertension: Secondary | ICD-10-CM | POA: Diagnosis not present

## 2016-10-24 DIAGNOSIS — E1159 Type 2 diabetes mellitus with other circulatory complications: Secondary | ICD-10-CM | POA: Diagnosis not present

## 2016-10-24 LAB — GLUCOSE, CAPILLARY
GLUCOSE-CAPILLARY: 189 mg/dL — AB (ref 65–99)
Glucose-Capillary: 365 mg/dL — ABNORMAL HIGH (ref 65–99)
Glucose-Capillary: 287 mg/dL — ABNORMAL HIGH (ref 65–99)

## 2016-10-24 LAB — ECHOCARDIOGRAM COMPLETE
HEIGHTINCHES: 63 in
Weight: 2212.8 oz

## 2016-10-24 LAB — BASIC METABOLIC PANEL
ANION GAP: 11 (ref 5–15)
BUN: 15 mg/dL (ref 6–20)
CALCIUM: 9 mg/dL (ref 8.9–10.3)
CO2: 25 mmol/L (ref 22–32)
Chloride: 95 mmol/L — ABNORMAL LOW (ref 101–111)
Creatinine, Ser: 1.14 mg/dL — ABNORMAL HIGH (ref 0.44–1.00)
GFR, EST AFRICAN AMERICAN: 49 mL/min — AB (ref >60)
GFR, EST NON AFRICAN AMERICAN: 42 mL/min — AB (ref >60)
Glucose, Bld: 192 mg/dL — ABNORMAL HIGH (ref 65–99)
Potassium: 3.6 mmol/L (ref 3.5–5.1)
SODIUM: 131 mmol/L — AB (ref 135–145)

## 2016-10-24 LAB — TROPONIN I: Troponin I: <0.03 ng/mL (ref <0.03)

## 2016-10-24 MED ORDER — FUROSEMIDE 20 MG PO TABS: 20.0000 mg | ORAL_TABLET | Freq: Every day | ORAL | 2 refills | Status: DC

## 2016-10-24 MED ORDER — INSULIN GLARGINE 100 UNIT/ML ~~LOC~~ SOLN
35.0000 [IU] | Freq: Every day | SUBCUTANEOUS | Status: DC
  Administered 2016-10-24: 35 [IU] via SUBCUTANEOUS
  Filled 2016-10-24 (×4): qty 0.35

## 2016-10-24 MED ORDER — HYDRALAZINE HCL 25 MG PO TABS: 25.0000 mg | ORAL_TABLET | Freq: Three times a day (TID) | ORAL | 0 refills | Status: DC

## 2016-10-24 MED ORDER — FUROSEMIDE 20 MG PO TABS: 20.0000 mg | ORAL_TABLET | Freq: Every day | ORAL | Status: DC

## 2016-10-24 NOTE — Progress Notes (Signed)
SATURATION QUALIFICATIONS: (This note is used to comply with regulatory documentation for home oxygen)  Patient Saturations on Room Air at Rest = 97%  Patient Saturations on Room Air while Ambulating = 96%  Patient Saturations on 0 Liters of oxygen while Ambulating = 50%  Please briefly explain why patient needs home oxygen:

## 2016-10-24 NOTE — Progress Notes (Signed)
*  PRELIMINARY RESULTS* Echocardiogram 2D Echocardiogram has been performed.  Leavy Cella 10/24/2016, 3:22 PM

## 2016-10-24 NOTE — Discharge Summary (Signed)
Physician Discharge Summary  Misty Edwards WCH:[id number] DOB: February 11, 1927 DOA: 10/23/2016  PCP: Susy Frizzle, MD  Admit date: 10/23/2016 Discharge date: 10/24/2016  Admitted From: home Disposition:  home  Recommendations for Outpatient Follow-up:  1. Follow up with PCP in 1-2 weeks 2. Please obtain BMP/CBC in one week 3. Follow up with gastroenterology as scheduled for colonoscopy  Home Health: Equipment/Devices:  Discharge Condition: stable CODE STATUS: full code Diet recommendation: Heart Healthy / Carb Modified   Brief/Interim Summary: 83 year old female with a history of hypertension and diabetes was sent to the emergency room from preoperative endoscopy for low oxygen saturations, dizziness. She was found to be significantly hypoxic and had decompensated CHF. Patient was admitted to the hospital and started on intravenous Lasix.  Discharge Diagnoses:  Active Problems:   Essential hypertension   Chronic pancreatitis (HCC)   Type 2 diabetes mellitus with vascular disease (HCC)   Heme positive stool   Acute on chronic diastolic CHF (congestive heart failure) (HCC)   Acute respiratory failure with hypoxia ([place])  1. Acute respiratory failure with hypoxia. Likely related to decompensated CHF. Patient was weaned off oxygen and is now breathing comfortably on room air. She is able to ambulate and maintain saturations greater than 96%. 2. Acute on chronic diastolic CHF exacerbation. Echocardiogram shows preserved ejection fraction with grade 2 diastolic dysfunction. Patient takes Lasix at home as needed for fluid. She was treated in the hospital with IV Lasix. Weight is down approximately 5 pounds since admission. Continue on beta blockers. Will change home dose of Lasix from when necessary to daily. Repeat renal function in 1 week. 3. Diabetes. Continue on Lantus and sliding scale insulin. Blood sugars have been stable. 4. Chronic pancreatitis. Continue Creon with  meals. 5. Hypertension. Continue home regimen of beta blockers and amlodipine. Blood pressure remained uncontrolled and hydralazine was added to regimen. 6. Heme positive stools. Follow up with GI to have elective colonoscopy and EGD.  Discharge Instructions  Discharge Instructions    Diet - low sodium heart healthy    Complete by:  As directed    Increase activity slowly    Complete by:  As directed      Allergies as of 10/24/2016      Reactions   Calcium-containing Compounds Nausea Only   Morphine And Related Other (See Comments)   Hallucination   Raloxifene Itching   Evista- Face and eyes burning   Vitamin D Analogs Nausea Only      Medication List    TAKE these medications   acetaminophen 500 MG tablet Commonly known as:  TYLENOL Take 500-1,000 mg by mouth 3 (three) times daily as needed for mild pain. Pain.   amLODipine 5 MG tablet Commonly known as:  NORVASC TAKE ONE TABLET BY MOUTH ONCE DAILY   aspirin 81 MG EC tablet Take 1 tablet (81 mg total) by mouth daily.   BENADRYL 25 mg capsule Generic drug:  diphenhydrAMINE Take 25 mg by mouth daily as needed for allergies.   cetirizine 10 MG tablet Commonly known as:  ZYRTEC TAKE ONE TABLET BY MOUTH ONCE DAILY   CREON 24000-76000 units Cpep Generic drug:  Pancrelipase (Lip-Prot-Amyl) Take 1-2 capsules by mouth See admin instructions. Takes 2 caps 3 times daily with meals and 1 with snacks   CREON 24000-76000 units Cpep Generic drug:  Pancrelipase (Lip-Prot-Amyl) TAKE TWO CAPSULES BY MOUTH WITH MEALS AND  ONE  CAPSULE  WITH  SNACKS   cycloSPORINE 0.05 % ophthalmic emulsion Commonly known  as:  RESTASIS Place 1 drop into both eyes 3 (three) times daily.   docusate sodium 100 MG capsule Commonly known as:  COLACE Take 1 capsule (100 mg total) by mouth 2 (two) times daily as needed for mild constipation.   ferrous sulfate 325 (65 FE) MG tablet Take 325 mg by mouth daily with breakfast.   FLUoxetine 20 MG  tablet Commonly known as:  PROZAC TAKE ONE TABLET BY MOUTH ONCE DAILY What changed:  See the new instructions.   fluticasone 50 MCG/ACT nasal spray Commonly known as:  FLONASE Place into both nostrils daily as needed for allergies or rhinitis.   furosemide 20 MG tablet Commonly known as:  LASIX Take 1 tablet (20 mg total) by mouth daily. What changed:  See the new instructions.   GAS-X EXTRA STRENGTH 125 MG Caps Generic drug:  Simethicone Take 125 mg by mouth 2 (two) times daily as needed (GAS).   hydrALAZINE 25 MG tablet Commonly known as:  APRESOLINE Take 1 tablet (25 mg total) by mouth 3 (three) times daily. What changed:  medication strength  how much to take  how to take this  when to take this  additional instructions   insulin aspart 100 UNIT/ML injection Commonly known as:  NOVOLOG Pt uses sliding scale - E11.65 What changed:  how much to take  how to take this  when to take this  additional instructions   insulin glargine 100 UNIT/ML injection Commonly known as:  LANTUS Inject 0.25 mLs (25 Units total) into the skin every morning. What changed:  how much to take  when to take this   Insulin Syringes (Disposable) U-100 1 ML Misc 1/2 inch 31 g   lactose free nutrition Liqd Take 237 mLs by mouth daily as needed (takes if sugar is really low).   GLUCERNA Liqd Take 237 mLs by mouth daily as needed (takes if sugar is low).   LORazepam 0.5 MG tablet Commonly known as:  ATIVAN TAKE 1 TO 2 TABLETS BY MOUTH ONCE DAILY AT BEDTIME AS NEEDED FOR INSOMNIA What changed:  See the new instructions.   metoprolol tartrate 25 MG tablet Commonly known as:  LOPRESSOR TAKE ONE TABLET BY MOUTH TWICE DAILY   multivitamin capsule Take 1 capsule by mouth daily.   omeprazole 40 MG capsule Commonly known as:  PRILOSEC TAKE ONE CAPSULE BY MOUTH ONCE DAILY   ONE TOUCH ULTRA TEST test strip Generic drug:  glucose blood USE TO TEST UP TO 5 TIMES DAILY AS  DIRECTED.   oxyCODONE-acetaminophen 5-325 MG tablet Commonly known as:  PERCOCET/ROXICET Take 1 tablet by mouth every 8 (eight) hours as needed for moderate pain.   peg 3350 powder 100 g Solr Commonly known as:  MOVIPREP Take 1 kit (200 g total) by mouth as directed.   potassium chloride 10 MEQ tablet Commonly known as:  K-DUR TAKE ONE TABLET BY MOUTH ONCE DAILY What changed:  See the new instructions.   promethazine 12.5 MG tablet Commonly known as:  PHENERGAN Take 0.5-1 tablets (6.25-12.5 mg total) by mouth every 6 (six) hours as needed for nausea or vomiting.   rosuvastatin 5 MG tablet Commonly known as:  CRESTOR TAKE ONE TABLET BY MOUTH ONCE DAILY What changed:  See the new instructions.   tamsulosin 0.4 MG Caps capsule Commonly known as:  FLOMAX Take 1 capsule (0.4 mg total) by mouth daily.   triamcinolone cream 0.1 % Commonly known as:  KENALOG Apply 1 application topically 2 (two) times daily.  What changed:  when to take this  reasons to take this       Allergies  Allergen Reactions  . Calcium-Containing Compounds Nausea Only  . Morphine And Related Other (See Comments)    Hallucination  . Raloxifene Itching    Evista- Face and eyes burning  . Vitamin D Analogs Nausea Only    Consultations:     Procedures/Studies: Dg Chest 2 View  Result Date: 09/26/2016 CLINICAL DATA:  83 year old female with abdominal pain, nausea vomiting. History of breast cancer. EXAM: CHEST  2 VIEW COMPARISON:  Chest radiograph dated 09/27/2014 FINDINGS: There is mild cardiomegaly. There is diffuse interstitial prominence and nodularity which may represent interstitial edema, or lymphangitic spread of tumor on this patient with history of breast cancer. Atypical pneumonia is not excluded. Clinical correlation is recommended. There is no pleural effusion or pneumothorax. Median sternotomy wires and CABG vascular clips. Surgical clips noted over the right side of chest. No acute  osseous pathology. IMPRESSION: Diffuse interstitial prominence and nodularity which may represent edema but is concerning for lymphangitic spread of tumor in this patient with history of breast cancer. Pneumonia is not excluded. Clinical correlation is recommended. Electronically Signed   By: Anner Crete M.D.   On: 09/26/2016 23:12   Ct Abdomen Pelvis W Contrast  Result Date: 09/26/2016 CLINICAL DATA:  Sharp intermittent pain in the right lower abdomen EXAM: CT ABDOMEN AND PELVIS WITH CONTRAST TECHNIQUE: Multidetector CT imaging of the abdomen and pelvis was performed using the standard protocol following bolus administration of intravenous contrast. CONTRAST:  185m ISOVUE-300 IOPAMIDOL (ISOVUE-300) INJECTION 61% COMPARISON:  02/04/2012 FINDINGS: Lower chest: Small bilateral pleural effusions. Patchy dependent atelectasis. Borderline to mild cardiomegaly. Hepatobiliary: No focal liver abnormality is seen. Status post cholecystectomy. No biliary dilatation. Pancreas: Very atrophic.  No inflammatory changes. Spleen: Normal in size without focal abnormality. Adrenals/Urinary Tract: Adrenal glands are within normal limits. Left kidney shows no hydronephrosis. There is moderate right perinephric fat stranding. Upper pole stones on the right, measuring up to 9 mm. Moderate severe hydronephrosis of the right kidney and proximal right ureter, secondary to diffuse increased density within the proximal right ureter, measuring 4 mm transverse, but over a cranial caudad length of about 2 cm, suspect that these are multiple stones within the proximal right ureter. Bladder unremarkable Stomach/Bowel: The stomach is nonenlarged. No dilated small bowel. Collapsed colon versus mild wall thickening of the right colon and transverse colon. Normal appendix. Vascular/Lymphatic: Aortic atherosclerosis. No enlarged abdominal or pelvic lymph nodes. Reproductive: Numerous calcified fibroids in the uterus. No adnexal masses. Other:  No free air or free fluid. Musculoskeletal: Degenerative changes. No acute or suspicious bone lesion. IMPRESSION: 1. Moderate perinephric fat stranding on the right. Moderate to marked right hydronephrosis and proximal hydroureter, secondary to diffuse increased density in the proximal right ureter over a 2 cm craniocaudad length, suspected to represent multiple stones in the proximal right ureter. 2. Negative appendix 3. Marked atrophy of the pancreas 4. Multiple calcified uterine fibroids. 5. Trace bilateral effusions. Electronically Signed   By: KDonavan FoilM.D.   On: 09/26/2016 23:23   Dg Chest Portable 1 View  Result Date: 10/23/2016 CLINICAL DATA:  Dizziness since Monday. EXAM: PORTABLE CHEST 1 VIEW COMPARISON:  09/26/2016 FINDINGS: The heart is normal in size and stable. Mild tortuosity of the thoracic aorta. Stable surgical changes from coronary artery bypass surgery. There is a diffuse but asymmetric and fairly pronounced interstitial process, right lung greater than left. There are also  small pleural effusions. This is most likely asymmetric pulmonary edema or possibly some type of severe interstitial pneumonitis. The bony structures are intact. IMPRESSION: Asymmetric interstitial process, likely pulmonary edema with small pleural effusions. Electronically Signed   By: Marijo Sanes M.D.   On: 10/23/2016 11:42   Echo: - Left ventricle: The cavity size was normal. Wall thickness was   increased in a pattern of mild LVH. Systolic function was normal.   The estimated ejection fraction was in the range of 55% to 60%.   Wall motion was normal; there were no regional wall motion   abnormalities. Features are consistent with a pseudonormal left   ventricular filling pattern, with concomitant abnormal relaxation   and increased filling pressure (grade 2 diastolic dysfunction).   Doppler parameters are consistent with high ventricular filling   pressure. - Aortic valve: Mildly calcified annulus.  Trileaflet; mildly   thickened leaflets. Valve area (VTI): 2.51 cm^2. Valve area   (Vmax): 2.58 cm^2. - Mitral valve: There was mild regurgitation. - Left atrium: The atrium was mildly dilated. - Technically adequate study.   Subjective: Feeling better. Shortness of breath resolved. No chest pain  Discharge Exam: Vitals:   10/24/16 0446 10/24/16 1300  BP: (!) 107/49 (!) 152/50  Pulse: (!) 58 69  Resp: 18 18  Temp: 97.9 F (36.6 C) 98.3 F (36.8 C)  SpO2: 98% 95%   Vitals:   10/23/16 1650 10/23/16 2043 10/24/16 0446 10/24/16 1300  BP:  (!) 134/48 (!) 107/49 (!) 152/50  Pulse:  72 (!) 58 69  Resp:  '18 18 18  ' Temp:  98.1 F (36.7 C) 97.9 F (36.6 C) 98.3 F (36.8 C)  TempSrc:  Oral Oral Oral  SpO2: 96% 100% 98% 95%  Weight:   62.7 kg (138 lb 4.8 oz)   Height:        General: Pt is alert, awake, not in acute distress Cardiovascular: RRR, S1/S2 +, no rubs, no gallops Respiratory: CTA bilaterally, no wheezing, no rhonchi Abdominal: Soft, NT, ND, bowel sounds + Extremities: no edema, no cyanosis    The results of significant diagnostics from this hospitalization (including imaging, microbiology, ancillary and laboratory) are listed below for reference.     Microbiology: No results found for this or any previous visit (from the past 240 hour(s)).   Labs: BNP (last 3 results)  Recent Labs  09/26/16 2031 10/23/16 1116  BNP 1,196.0* 161.0*   Basic Metabolic Panel:  Recent Labs Lab 10/23/16 1112 10/24/16 0506  NA 131* 131*  K 4.1 3.6  CL 100* 95*  CO2 21* 25  GLUCOSE 179* 192*  BUN 8 15  CREATININE 0.70 1.14*  CALCIUM 9.2 9.0   Liver Function Tests:  Recent Labs Lab 10/23/16 1112  AST 28  ALT 24  ALKPHOS 62  BILITOT 1.2  PROT 7.7  ALBUMIN 4.2   No results for input(s): LIPASE, AMYLASE in the last 168 hours. No results for input(s): AMMONIA in the last 168 hours. CBC:  Recent Labs Lab 10/23/16 1112  WBC 7.1  NEUTROABS 4.8  HGB 11.6*   HCT 35.5*  MCV 83.3  PLT 151   Cardiac Enzymes:  Recent Labs Lab 10/23/16 1112 10/23/16 1657 10/23/16 2231 10/24/16 0506  TROPONINI <0.03 <0.03 <0.03 <0.03   BNP: Invalid input(s): POCBNP CBG:  Recent Labs Lab 10/23/16 1713 10/23/16 2046 10/24/16 0749 10/24/16 1157 10/24/16 1617  GLUCAP 195* 301* 189* 365* 287*   D-Dimer No results for input(s): DDIMER in the last  72 hours. Hgb A1c No results for input(s): HGBA1C in the last 72 hours. Lipid Profile No results for input(s): CHOL, HDL, LDLCALC, TRIG, CHOLHDL, LDLDIRECT in the last 72 hours. Thyroid function studies  Recent Labs  10/23/16 1140  TSH 0.099*   Anemia work up No results for input(s): VITAMINB12, FOLATE, FERRITIN, TIBC, IRON, RETICCTPCT in the last 72 hours. Urinalysis    Component Value Date/Time   COLORURINE STRAW (A) 10/23/2016 1112   APPEARANCEUR CLEAR 10/23/2016 1112   LABSPEC 1.006 10/23/2016 1112   PHURINE 5.0 10/23/2016 1112   GLUCOSEU 50 (A) 10/23/2016 1112   HGBUR NEGATIVE 10/23/2016 1112   BILIRUBINUR NEGATIVE 10/23/2016 1112   KETONESUR NEGATIVE 10/23/2016 1112   PROTEINUR NEGATIVE 10/23/2016 1112   UROBILINOGEN 0.2 09/02/2014 1445   NITRITE NEGATIVE 10/23/2016 1112   LEUKOCYTESUR NEGATIVE 10/23/2016 1112   Sepsis Labs Invalid input(s): PROCALCITONIN,  WBC,  LACTICIDVEN Microbiology No results found for this or any previous visit (from the past 240 hour(s)).   Time coordinating discharge: Over 30 minutes  SIGNED:   Kathie Dike, MD  [facility] 10/24/2016, 4:29 PM Pager   If 7PM-7AM, please contact night-coverage [email or web address] Password [email or web address]

## 2016-10-24 NOTE — Progress Notes (Addendum)
Inpatient Diabetes Program Recommendations  AACE/ADA: New Consensus Statement on Inpatient Glycemic Control (2015)  Target Ranges:  Prepandial:   less than 140 mg/dL      Peak postprandial:   less than 180 mg/dL (1-2 hours)      Critically ill patients:  140 - 180 mg/dL   Results for TARESA, MONTVILLE (MRN [id number]) as of 10/24/2016 11:00  Ref. Range 10/23/2016 09:58 10/23/2016 17:13 10/23/2016 20:46 10/24/2016 07:49  Glucose-Capillary Latest Ref Range: 65 - 99 mg/dL 152 (H) 195 (H) 301 (H) 189 (H)   Review of Glycemic Control  Diabetes history: DM2 Outpatient Diabetes medications: Lantus 18 units daily, Novolog 2 units TID with meals (per home medication list in chart) Current orders for Inpatient glycemic control: Lantus 35 units daily, Novolog 0-9 units TID with meals  Inpatient Diabetes Program Recommendations Insulin - Basal: Noted Lantus increased from 18 units daily to 35 units daily. Patient received Lantus 18 units at 18:09 on 10/23/16 and has already received Lantus 35 units this morning. Anticipate patient will be at risk for hypoglycemia. Discussed with Lilia Pro, RN and it was communicated that Lantus was increased due to family request as they stated patient got Lantus 35 units daily at home. If patient experiences any hypoglycemia, recommend decreasing Lantus down to 20 units daily. Insulin-Correction: Please consider ordering Novolog 0-5 units QHS.  Thanks, Barnie Alderman, RN, MSN, CDE Diabetes Coordinator Inpatient Diabetes Program [phone] (Team Pager from 8am to 5pm)

## 2016-10-24 NOTE — Plan of Care (Signed)
Problem: Food- and Nutrition-Related Knowledge Deficit (NB-1.1) Goal: Nutrition education Formal process to instruct or train a patient/client in a skill or to impart knowledge to help patients/clients voluntarily manage or modify food choices and eating behavior to maintain or improve health. Outcome: Adequate for Discharge Nutrition Education Note  RD consulted for nutrition education regarding onset CHF. The patient ahd her grandson are here. He and other family members are very supportive and involved in Misty Edwards's daily care. She lives alone but they are there or checking in frequently.  RD provided "Heart Failure Nutrition Therapy" handout from the Academy [employer].   Reviewed patient's dietary recall: The patient says she has been following a low sodium diet since her heart attack 2 years ago. She has adapted to using Misty Edwards to flavor her foods. Breakfast is her main meal of the day which includes eggs, bacon or sausage ([place] mostly but sometimes regular). She has toast and coffee. Mid day is often a pack of nabs which are high in sodium (330 mg) but usually all she consumes. Sometimes she will drink a Glucerna to supplement her nutrition intake. In the evening she has a sandwich usually deli meat (only 1 slice) though which would limit the sodium content. She doesn't eat cheese at all. She does eat soup occasionally and we talked about purchasing the low sodium versions of the chicken noodle which is her favorite.   At home she weighs herself every day morning and night and reports baseline wt usually 142# in the morning an 144# at night.   Encouraged fresh fruits and vegetables as well as whole grain sources of carbohydrates to maximize fiber intake. She sometimes uses canned vegetables but explained to RD she pours water off and heats in fresh water.  Expect good compliance. Misty Edwards has already adopted most of the basic principles of dietary management heart failure.    Body mass index is 24.5 kg/m. Pt meets criteria for normal based on current BMI.  Current diet order is Heart healthy, patient is consuming approximately 50% of meals at this time. Labs and medications reviewed. No further nutrition interventions warranted at this time. RD contact information provided. If additional nutrition issues arise, please re-consult RD.   Colman Cater Misty,RD,CSG,LDN Office: [phone] Pager: [phone]

## 2016-10-24 NOTE — Care Management Note (Signed)
Case Management Note  Patient Details  Name: Misty Edwards MRN: [id number] Date of Birth: 29-Aug-1926  Subjective/Objective:   Adm with CHF/hypoxia. From home alone, grandson spends the night 2 night a week. She is ind with ADL's. Has RW if needed but doesn't use normally. Has PCP, family drives her to appointments. She reports no issues affording medications. Currently on room air.                  Action/Plan: Anticipate DC home with self care.   Expected Discharge Date:       10/24/2016           Expected Discharge Plan:  Home/Self Care  In-House Referral:     Discharge planning Services  CM Consult  Post Acute Care Choice:  NA Choice offered to:  NA  DME Arranged:    DME Agency:     HH Arranged:    HH Agency:     Status of Service:  Completed, signed off  If discussed at [employer] of Stay Meetings, dates discussed:    Additional Comments:  [name], Chauncey Reading, RN 10/24/2016, 1:32 PM

## 2016-11-05 ENCOUNTER — Other Ambulatory Visit: Payer: Self-pay | Admitting: Family Medicine

## 2016-11-05 MED ORDER — INSULIN SYRINGES (DISPOSABLE) U-100 1 ML MISC: 3 refills | Status: DC

## 2016-11-10 ENCOUNTER — Inpatient Hospital Stay: Payer: Medicare HMO | Admitting: Family Medicine

## 2016-11-11 ENCOUNTER — Inpatient Hospital Stay: Payer: Medicare HMO | Admitting: Family Medicine

## 2016-11-14 ENCOUNTER — Ambulatory Visit (INDEPENDENT_AMBULATORY_CARE_PROVIDER_SITE_OTHER): Payer: Medicare HMO | Admitting: Family Medicine

## 2016-11-14 VITALS — BP 170/80 | HR 76 | Temp 97.6°F | Resp 16 | Ht 63.0 in | Wt 141.0 lb

## 2016-11-14 DIAGNOSIS — I5189 Other ill-defined heart diseases: Secondary | ICD-10-CM

## 2016-11-14 DIAGNOSIS — I10 Essential (primary) hypertension: Secondary | ICD-10-CM | POA: Diagnosis not present

## 2016-11-14 DIAGNOSIS — Z8679 Personal history of other diseases of the circulatory system: Secondary | ICD-10-CM

## 2016-11-14 DIAGNOSIS — I519 Heart disease, unspecified: Secondary | ICD-10-CM

## 2016-11-14 DIAGNOSIS — Z951 Presence of aortocoronary bypass graft: Secondary | ICD-10-CM | POA: Diagnosis not present

## 2016-11-14 DIAGNOSIS — Z09 Encounter for follow-up examination after completed treatment for conditions other than malignant neoplasm: Secondary | ICD-10-CM

## 2016-11-14 DIAGNOSIS — Z23 Encounter for immunization: Secondary | ICD-10-CM

## 2016-11-14 LAB — CBC WITH DIFFERENTIAL/PLATELET
BASOS PCT: 0 %
Basophils Absolute: 0 cells/uL (ref 0–200)
Eosinophils Absolute: 116 cells/uL (ref 15–500)
Eosinophils Relative: 2 %
HCT: 35 % (ref 35.0–45.0)
HEMOGLOBIN: 11.1 g/dL — AB (ref 12.0–15.0)
LYMPHS ABS: 1972 {cells}/uL (ref 850–3900)
Lymphocytes Relative: 34 %
MCH: 26.9 pg — ABNORMAL LOW (ref 27.0–33.0)
MCHC: 31.7 g/dL — ABNORMAL LOW (ref 32.0–36.0)
MCV: 84.7 fL (ref 80.0–100.0)
MPV: 10.8 fL (ref 7.5–12.5)
Monocytes Absolute: 464 cells/uL (ref 200–950)
Monocytes Relative: 8 %
NEUTROS ABS: 3248 {cells}/uL (ref 1500–7800)
Neutrophils Relative %: 56 %
PLATELETS: 132 10*3/uL — AB (ref 140–400)
RBC: 4.13 MIL/uL (ref 3.80–5.10)
RDW: 14.6 % (ref 11.0–15.0)
WBC: 5.8 10*3/uL (ref 3.8–10.8)

## 2016-11-14 NOTE — Progress Notes (Signed)
Subjective:    Patient ID: Misty Edwards, female    DOB: 1926-04-23, 83 y.o.   MRN: [id number]  HPI  Recently, the patient has been found to be anemic and was also found to have blood in her stools on 3 separate occasions. Therefore the patient was being evaluated by GI. The morning of admission, she was preparing for colonoscopy. However at the endoscopy Center, the patient was found to be hypoxic and was referred to the emergency room where she was admitted for shortness of breath, pulmonary edema secondary to diastolic dysfunction. I have copied the discharge summary below for my reference:   Admit date: 10/23/2016 Discharge date: 10/24/2016  Admitted From: home Disposition:  home  Recommendations for Outpatient Follow-up:  1. Follow up with PCP in 1-2 weeks 2. Please obtain BMP/CBC in one week 3. Follow up with gastroenterology as scheduled for colonoscopy  Home Health: Equipment/Devices:  Discharge Condition: stable CODE STATUS: full code Diet recommendation: Heart Healthy / Carb Modified   Brief/Interim Summary: 83 year old female with a history of hypertension and diabetes was sent to the emergency room from preoperative endoscopy for low oxygen saturations, dizziness. She was found to be significantly hypoxic and had decompensated CHF. Patient was admitted to the hospital and started on intravenous Lasix.  Discharge Diagnoses:  Active Problems:   Essential hypertension   Chronic pancreatitis (HCC)   Type 2 diabetes mellitus with vascular disease (HCC)   Heme positive stool   Acute on chronic diastolic CHF (congestive heart failure) (HCC)   Acute respiratory failure with hypoxia ([place])  1. Acute respiratory failure with hypoxia. Likely related to decompensated CHF. Patient was weaned off oxygen and is now breathing comfortably on room air. She is able to ambulate and maintain saturations greater than 96%. 2. Acute on chronic diastolic CHF exacerbation.  Echocardiogram shows preserved ejection fraction with grade 2 diastolic dysfunction. Patient takes Lasix at home as needed for fluid. She was treated in the hospital with IV Lasix. Weight is down approximately 5 pounds since admission. Continue on beta blockers. Will change home dose of Lasix from when necessary to daily. Repeat renal function in 1 week. 3. Diabetes. Continue on Lantus and sliding scale insulin. Blood sugars have been stable. 4. Chronic pancreatitis. Continue Creon with meals. 5. Hypertension. Continue home regimen of beta blockers and amlodipine. Blood pressure remained uncontrolled and hydralazine was added to regimen. 6. Heme positive stools. Follow up with GI to have elective colonoscopy and EGD.  Patient is here today for follow-up. Her weight is down approximately 5 pounds since I last saw her. There is no pitting edema in her extremity. Her lungs are clear to auscultation bilaterally. She is satting 98% on room air. She denies any chest pain shortness of breath or dyspnea on exertion. Her blood pressure however is extremely high. I allowed the patient to sit for a while and then I repeated it. Repeat blood pressure reading was 158/64. She assures me that she is checking her blood pressure 3 times a day at every meal and that her blood pressures typically 120-130/60-70. Therefore she has not been taking her hydralazine. She has been compliant with Lasix 20 mg a day. She is checking her blood sugars and her blood sugars are typically ranging between 102 100. The 200 blood sugars are random blood sugars and given her age, we have allowed permissive hyperglycemia Past Medical History:  Diagnosis Date  . Acute biliary pancreatitis 07/2002   thia was in 07/2004:she still  has pseudocyst in tail of pancreas measuring 54 x 33 mm   . Adrenal adenoma    bilateral  . Anxiety   . Chronic pancreatitis ([place])   . Detached retina   . Diabetes mellitus ([place])   . Diverticulosis   . History of  carcinoma in situ of breast 1988  . Hyperlipidemia   . Hypertension   . IBS (irritable bowel syndrome)   . Legally blind   . Osteoarthritis   . Osteopenia   . Upper GI bleed September2004   secondary to gastritis   Past Surgical History:  Procedure Laterality Date  . CARDIAC CATHETERIZATION N/A 08/24/2014   Procedure: Left Heart Cath and Coronary Angiography;  Surgeon: Leonie Man, MD;  Location: [facility];  Service: Cardiovascular;  Laterality: N/A;  . COLONOSCOPY  08/15/05   few tiny diverticula at sigmoid colon/external hemorrhoids but no polyps  . COLONOSCOPY  01/08/2012   [id number] diverticulosis. Next colonoscopy in 12/2016  . CORONARY ARTERY BYPASS GRAFT N/A 08/24/2014   Procedure: CORONARY ARTERY BYPASS GRAFTING (CABG) x three, using left internal mammary artery and right leg greater saphenous vein harvested endoscopically;  Surgeon: Ivin Poot, MD;  Location: [facility];  Service: Open Heart Surgery;  Laterality: N/A;  . ECTOPIC PREGNANCY SURGERY  1950's  . ERCP with sphincterotomy  07/2002  . ESOPHAGOGASTRODUODENOSCOPY      Gastritis of body.  Otherwise normal  . ESOPHAGOGASTRODUODENOSCOPY  01/08/2012   RMR: Few scattered gastric erosions of uncertain significance-status post biopsy. Minimal chronic inflammation, no H.pylori  . LAPAROSCOPIC CHOLECYSTECTOMY    . PANCREATIC PSEUDOCYST DRAINAGE  [id number]   drained percutaneously   . right mastectomy    . tacking up of her bladder    . TEE WITHOUT CARDIOVERSION N/A 08/24/2014   Procedure: TRANSESOPHAGEAL ECHOCARDIOGRAM (TEE);  Surgeon: Ivin Poot, MD;  Location: [facility];  Service: Open Heart Surgery;  Laterality: N/A;   Current Outpatient Prescriptions on File Prior to Visit  Medication Sig Dispense Refill  . acetaminophen (TYLENOL) 500 MG tablet Take 500-1,000 mg by mouth 3 (three) times daily as needed for mild pain. Pain.     Marland Kitchen amLODipine (NORVASC) 5 MG tablet TAKE ONE TABLET BY MOUTH ONCE DAILY 90 tablet 3  .  aspirin EC 81 MG EC tablet Take 1 tablet (81 mg total) by mouth daily.    . cetirizine (ZYRTEC) 10 MG tablet TAKE ONE TABLET BY MOUTH ONCE DAILY 90 tablet 3  . CREON 24000 units CPEP TAKE TWO CAPSULES BY MOUTH WITH MEALS AND  ONE  CAPSULE  WITH  SNACKS 720 capsule 3  . cycloSPORINE (RESTASIS) 0.05 % ophthalmic emulsion Place 1 drop into both eyes 3 (three) times daily.     . diphenhydrAMINE (BENADRYL) 25 mg capsule Take 25 mg by mouth daily as needed for allergies.    Marland Kitchen docusate sodium (COLACE) 100 MG capsule Take 1 capsule (100 mg total) by mouth 2 (two) times daily as needed for mild constipation. 60 capsule 5  . ferrous sulfate 325 (65 FE) MG tablet Take 325 mg by mouth daily with breakfast.    . FLUoxetine (PROZAC) 20 MG tablet TAKE ONE TABLET BY MOUTH ONCE DAILY (Patient taking differently: TAKE ONE TABLET BY MOUTH ONCE DAILY AT BEDTIME) 90 tablet 3  . fluticasone (FLONASE) 50 MCG/ACT nasal spray Place into both nostrils daily as needed for allergies or rhinitis.    . furosemide (LASIX) 20 MG tablet Take 1 tablet (20 mg total) by  mouth daily. 90 tablet 2  . GLUCERNA (GLUCERNA) LIQD Take 237 mLs by mouth daily as needed (takes if sugar is low).    . hydrALAZINE (APRESOLINE) 25 MG tablet Take 1 tablet (25 mg total) by mouth 3 (three) times daily. 90 tablet 0  . insulin aspart (NOVOLOG) 100 UNIT/ML injection Pt uses sliding scale - E11.65 (Patient taking differently: Inject 2 Units into the skin 3 (three) times daily with meals. Pt uses sliding scale - E11.65) 10 mL 11  . insulin glargine (LANTUS) 100 UNIT/ML injection Inject 0.25 mLs (25 Units total) into the skin every morning. (Patient taking differently: Inject 18 Units into the skin daily. ) 10 mL 5  . Insulin Syringes, Disposable, U-100 1 ML MISC 1/2 inch 31 g 300 each 3  . lactose free nutrition (BOOST) LIQD Take 237 mLs by mouth daily as needed (takes if sugar is really low).    . LORazepam (ATIVAN) 0.5 MG tablet TAKE 1 TO 2 TABLETS BY  MOUTH ONCE DAILY AT BEDTIME AS NEEDED FOR INSOMNIA (Patient taking differently: TAKE 2 TABLETS BY MOUTH ONCE DAILY AT BEDTIME AS NEEDED FOR INSOMNIA) 60 tablet 1  . metoprolol tartrate (LOPRESSOR) 25 MG tablet TAKE ONE TABLET BY MOUTH TWICE DAILY 180 tablet 3  . Multiple Vitamin (MULTIVITAMIN) capsule Take 1 capsule by mouth daily.    Marland Kitchen omeprazole (PRILOSEC) 40 MG capsule TAKE ONE CAPSULE BY MOUTH ONCE DAILY 90 capsule 3  . ONE TOUCH ULTRA TEST test strip USE TO TEST UP TO 5 TIMES DAILY AS DIRECTED. 450 each 0  . Pancrelipase, Lip-Prot-Amyl, (CREON) 24000-76000 units CPEP Take 1-2 capsules by mouth See admin instructions. Takes 2 caps 3 times daily with meals and 1 with snacks    . potassium chloride (K-DUR) 10 MEQ tablet TAKE ONE TABLET BY MOUTH ONCE DAILY (Patient taking differently: TAKE ONE TABLET BY MOUTH ONCE DAILY AS NEEDED TAKES WITH FUROSEMIDE) 90 tablet 2  . promethazine (PHENERGAN) 12.5 MG tablet Take 0.5-1 tablets (6.25-12.5 mg total) by mouth every 6 (six) hours as needed for nausea or vomiting. 30 tablet 0  . rosuvastatin (CRESTOR) 5 MG tablet TAKE ONE TABLET BY MOUTH ONCE DAILY (Patient taking differently: TAKE ONE TABLET BY MOUTH ONCE DAILY AT BEDTIME) 30 tablet 1  . Simethicone (GAS-X EXTRA STRENGTH) 125 MG CAPS Take 125 mg by mouth 2 (two) times daily as needed (GAS).    Marland Kitchen triamcinolone cream (KENALOG) 0.1 % Apply 1 application topically 2 (two) times daily. (Patient taking differently: Apply 1 application topically 2 (two) times daily as needed (DRY SKIN). ) 60 g 0   No current facility-administered medications on file prior to visit.    Allergies  Allergen Reactions  . Calcium-Containing Compounds Nausea Only  . Morphine And Related Other (See Comments)    Hallucination  . Raloxifene Itching    Evista- Face and eyes burning  . Vitamin D Analogs Nausea Only   Social History   Social History  . Marital status: Widowed    Spouse name: N/A  . Number of children: 5  .  Years of education: N/A   Occupational History  . homemaker    Social History Main Topics  . Smoking status: Never Smoker  . Smokeless tobacco: Never Used  . Alcohol use No  . Drug use: No  . Sexual activity: No   Other Topics Concern  . Not on file   Social History Narrative   Lives in [place].    Review of Systems  All other systems reviewed and are negative.      Objective:   Physical Exam  Constitutional: She appears well-developed and well-nourished. No distress.  HENT:  Right Ear: External ear normal.  Left Ear: External ear normal.  Nose: Nose normal.  Mouth/Throat: Oropharynx is clear and moist. No oropharyngeal exudate.  Neck: Neck supple. No JVD present. No thyromegaly present.  Cardiovascular: Normal rate, regular rhythm and normal heart sounds.   No murmur heard. Pulmonary/Chest: Effort normal and breath sounds normal. No respiratory distress. She has no wheezes. She has no rales.  Abdominal: Soft. Bowel sounds are normal. She exhibits no distension. There is no tenderness. There is no rebound and no guarding.  Musculoskeletal: She exhibits no edema.  Lymphadenopathy:    She has no cervical adenopathy.  Skin: She is not diaphoretic.  Vitals reviewed.         Assessment & Plan:  Hospital discharge follow-up - Plan: CBC with Differential/Platelet, BASIC METABOLIC PANEL WITH GFR  Needs flu shot - Plan: Flu Vaccine QUAD 36+ mos IM  History of ASCVD  S/P CABG x 3  Essential hypertension  Diastolic dysfunction Overall, doing very well since hospitalization.  Hypoxia has resolved.  No evidence of fluid overload on exam.  Seems euvolemic.  Check bmp to monitor renal fcn and potassium on her current lasix and KDur dose.  Also recheck cbc and if hgb continues to drop, she needs to follow back up with GI for work up.  Daughter will bring bp cuff by so we can verify its accuracy because bp at home sound perfect off hydralazine.  She mayhave an element of  white coat syndrome.  Received flu shot today.

## 2016-11-15 LAB — BASIC METABOLIC PANEL WITH GFR
BUN: 19 mg/dL (ref 7–25)
CHLORIDE: 98 mmol/L (ref 98–110)
CO2: 25 mmol/L (ref 20–32)
Calcium: 9.2 mg/dL (ref 8.6–10.4)
Creat: 1.01 mg/dL — ABNORMAL HIGH (ref 0.60–0.88)
GFR, EST AFRICAN AMERICAN: 58 mL/min — AB (ref >=60)
GFR, Est Non African American: 50 mL/min — ABNORMAL LOW (ref >=60)
Glucose, Bld: 196 mg/dL — ABNORMAL HIGH (ref 70–99)
Potassium: 4.9 mmol/L (ref 3.5–5.3)
SODIUM: 135 mmol/L (ref 135–146)

## 2016-11-21 ENCOUNTER — Other Ambulatory Visit: Payer: Self-pay | Admitting: Family Medicine

## 2016-11-21 NOTE — Telephone Encounter (Signed)
Ok to refill 

## 2016-11-21 NOTE — Telephone Encounter (Signed)
ok 

## 2016-11-23 NOTE — Progress Notes (Signed)
Patient was not able to complete her procedures. Please arrange for follow up anemia, heme + stool

## 2016-11-24 ENCOUNTER — Encounter: Payer: Self-pay | Admitting: Internal Medicine

## 2016-11-24 NOTE — Progress Notes (Signed)
PATIENT SCHEDULED  °

## 2016-11-26 ENCOUNTER — Other Ambulatory Visit: Payer: Self-pay | Admitting: Family Medicine

## 2016-11-26 NOTE — Telephone Encounter (Signed)
Ok to refill 

## 2016-11-27 NOTE — Telephone Encounter (Signed)
ok 

## 2016-12-04 ENCOUNTER — Other Ambulatory Visit: Payer: Self-pay | Admitting: *Deleted

## 2016-12-04 ENCOUNTER — Other Ambulatory Visit: Payer: Self-pay | Admitting: Family Medicine

## 2016-12-04 DIAGNOSIS — R69 Illness, unspecified: Secondary | ICD-10-CM | POA: Diagnosis not present

## 2016-12-04 MED ORDER — GLUCOSE BLOOD VI STRP: ORAL_STRIP | 3 refills | Status: DC

## 2016-12-04 NOTE — Telephone Encounter (Signed)
Test strips refilled

## 2016-12-21 ENCOUNTER — Other Ambulatory Visit: Payer: Self-pay | Admitting: Family Medicine

## 2016-12-21 ENCOUNTER — Other Ambulatory Visit: Payer: Self-pay | Admitting: Cardiology

## 2016-12-21 DIAGNOSIS — F32A Depression, unspecified: Secondary | ICD-10-CM

## 2016-12-21 DIAGNOSIS — Z951 Presence of aortocoronary bypass graft: Secondary | ICD-10-CM

## 2016-12-21 DIAGNOSIS — F329 Major depressive disorder, single episode, unspecified: Secondary | ICD-10-CM

## 2017-01-06 ENCOUNTER — Encounter: Payer: Self-pay | Admitting: Cardiology

## 2017-01-07 ENCOUNTER — Ambulatory Visit: Payer: Medicare HMO | Admitting: Gastroenterology

## 2017-01-20 ENCOUNTER — Other Ambulatory Visit: Payer: Self-pay | Admitting: Family Medicine

## 2017-01-20 NOTE — Telephone Encounter (Signed)
Ok to refill 

## 2017-01-20 NOTE — Telephone Encounter (Signed)
ok 

## 2017-01-21 NOTE — Telephone Encounter (Signed)
Medication called to pharmacy. 

## 2017-02-09 ENCOUNTER — Other Ambulatory Visit: Payer: Self-pay | Admitting: Family Medicine

## 2017-02-19 ENCOUNTER — Other Ambulatory Visit: Payer: Self-pay | Admitting: Cardiology

## 2017-02-19 DIAGNOSIS — Z951 Presence of aortocoronary bypass graft: Secondary | ICD-10-CM

## 2017-02-26 ENCOUNTER — Ambulatory Visit: Payer: Medicare HMO | Admitting: Student

## 2017-02-26 ENCOUNTER — Ambulatory Visit: Payer: Medicare HMO | Admitting: Cardiology

## 2017-03-02 ENCOUNTER — Other Ambulatory Visit: Payer: Self-pay | Admitting: Family Medicine

## 2017-03-02 DIAGNOSIS — N904 Leukoplakia of vulva: Secondary | ICD-10-CM

## 2017-03-13 DIAGNOSIS — R69 Illness, unspecified: Secondary | ICD-10-CM | POA: Diagnosis not present

## 2017-03-31 ENCOUNTER — Other Ambulatory Visit: Payer: Self-pay | Admitting: Cardiology

## 2017-04-25 ENCOUNTER — Other Ambulatory Visit: Payer: Self-pay | Admitting: Cardiology

## 2017-04-25 DIAGNOSIS — Z951 Presence of aortocoronary bypass graft: Secondary | ICD-10-CM

## 2017-05-04 ENCOUNTER — Encounter: Payer: Self-pay | Admitting: Family Medicine

## 2017-05-04 ENCOUNTER — Ambulatory Visit (INDEPENDENT_AMBULATORY_CARE_PROVIDER_SITE_OTHER): Payer: Medicare HMO | Admitting: Family Medicine

## 2017-05-04 VITALS — BP 150/70 | HR 72 | Temp 97.5°F | Resp 14 | Ht 63.0 in | Wt 146.0 lb

## 2017-05-04 DIAGNOSIS — E119 Type 2 diabetes mellitus without complications: Secondary | ICD-10-CM | POA: Diagnosis not present

## 2017-05-04 DIAGNOSIS — Z794 Long term (current) use of insulin: Secondary | ICD-10-CM | POA: Diagnosis not present

## 2017-05-04 NOTE — Progress Notes (Signed)
Subjective:    Patient ID: Misty Edwards, female    DOB: May 25, 1926, 84 y.o.   MRN: [id number]  HPI Patient has not been seen since last year.  She is currently on 35 units of Lantus once a day in the morning.  She is also on metformin.  However she states over the last several months, her fasting blood sugars in the morning are averaging in the 300s.  Her postprandial sugars in the afternoon can vary anywhere from 200-500 but are typically near 300.  Her grandson is drawing up her insulin and prefilled syringes and placing them in the refrigerator for her because she is unable to see to draw up the insulin herself.  Therefore I am not certain if the insulin is being drawn up correctly.  I am also not certain if her meter is accurate.  She denies any polyuria, polydipsia.   Wt Readings from Last 3 Encounters:  05/04/17 146 lb (66.2 kg)  11/14/16 141 lb (64 kg)  10/24/16 138 lb 4.8 oz (62.7 kg)   Her weight is remarkably stable given the fact her sugars have apparently been over 300 for several months. Past Medical History:  Diagnosis Date  . Acute biliary pancreatitis 07/2002   thia was in 07/2004:she still has pseudocyst in tail of pancreas measuring 54 x 33 mm   . Adrenal adenoma    bilateral  . Anxiety   . Chronic pancreatitis ([place])   . Detached retina   . Diabetes mellitus ([place])   . Diverticulosis   . History of carcinoma in situ of breast 1988  . Hyperlipidemia   . Hypertension   . IBS (irritable bowel syndrome)   . Legally blind   . Osteoarthritis   . Osteopenia   . Upper GI bleed September2004   secondary to gastritis   Past Surgical History:  Procedure Laterality Date  . CARDIAC CATHETERIZATION N/A 08/24/2014   Procedure: Left Heart Cath and Coronary Angiography;  Surgeon: Leonie Man, MD;  Location: [facility];  Service: Cardiovascular;  Laterality: N/A;  . COLONOSCOPY  08/15/05   few tiny diverticula at sigmoid colon/external hemorrhoids but no polyps  .  COLONOSCOPY  01/08/2012   TIR:[id number] diverticulosis. Next colonoscopy in 12/2016  . CORONARY ARTERY BYPASS GRAFT N/A 08/24/2014   Procedure: CORONARY ARTERY BYPASS GRAFTING (CABG) x three, using left internal mammary artery and right leg greater saphenous vein harvested endoscopically;  Surgeon: Ivin Poot, MD;  Location: [facility];  Service: Open Heart Surgery;  Laterality: N/A;  . ECTOPIC PREGNANCY SURGERY  1950's  . ERCP with sphincterotomy  07/2002  . ESOPHAGOGASTRODUODENOSCOPY      Gastritis of body.  Otherwise normal  . ESOPHAGOGASTRODUODENOSCOPY  01/08/2012   RMR: Few scattered gastric erosions of uncertain significance-status post biopsy. Minimal chronic inflammation, no H.pylori  . LAPAROSCOPIC CHOLECYSTECTOMY    . PANCREATIC PSEUDOCYST DRAINAGE  [id number]   drained percutaneously   . right mastectomy    . tacking up of her bladder    . TEE WITHOUT CARDIOVERSION N/A 08/24/2014   Procedure: TRANSESOPHAGEAL ECHOCARDIOGRAM (TEE);  Surgeon: Ivin Poot, MD;  Location: [facility];  Service: Open Heart Surgery;  Laterality: N/A;   Current Outpatient Medications on File Prior to Visit  Medication Sig Dispense Refill  . acetaminophen (TYLENOL) 500 MG tablet Take 500-1,000 mg by mouth 3 (three) times daily as needed for mild pain. Pain.     Marland Kitchen amLODipine (NORVASC) 5 MG tablet TAKE 1 TABLET  BY MOUTH ONCE DAILY 90 tablet 3  . aspirin EC 81 MG EC tablet Take 1 tablet (81 mg total) by mouth daily.    . cetirizine (ZYRTEC) 10 MG tablet TAKE ONE TABLET BY MOUTH ONCE DAILY 90 tablet 3  . CREON 24000-76000 units CPEP TAKE TWO CAPSULES BY MOUTH WITH MEALS AND ONE CAP WITH SNACKS 720 capsule 3  . cycloSPORINE (RESTASIS) 0.05 % ophthalmic emulsion Place 1 drop into both eyes 3 (three) times daily.     . diphenhydrAMINE (BENADRYL) 25 mg capsule Take 25 mg by mouth daily as needed for allergies.    Marland Kitchen docusate sodium (COLACE) 100 MG capsule Take 1 capsule (100 mg total) by mouth 2 (two) times daily as needed  for mild constipation. 60 capsule 5  . ferrous sulfate 325 (65 FE) MG tablet Take 325 mg by mouth daily with breakfast.    . FLUoxetine (PROZAC) 20 MG tablet TAKE ONE TABLET BY MOUTH ONCE DAILY 90 tablet 3  . fluticasone (FLONASE) 50 MCG/ACT nasal spray Place into both nostrils daily as needed for allergies or rhinitis.    . furosemide (LASIX) 20 MG tablet Take 1 tablet (20 mg total) by mouth daily. 90 tablet 2  . GLUCERNA (GLUCERNA) LIQD Take 237 mLs by mouth daily as needed (takes if sugar is low).    Marland Kitchen glucose blood (ONE TOUCH ULTRA TEST) test strip USE TO TEST UP TO 5 TIMES DAILY AS DIRECTED. 450 each 3  . hydrALAZINE (APRESOLINE) 25 MG tablet Take 1 tablet (25 mg total) by mouth 3 (three) times daily. 90 tablet 0  . insulin aspart (NOVOLOG) 100 UNIT/ML injection Pt uses sliding scale - E11.65 (Patient taking differently: Inject 2 Units into the skin 3 (three) times daily with meals. Pt uses sliding scale - E11.65) 10 mL 11  . Insulin Syringes, Disposable, U-100 1 ML MISC 1/2 inch 31 g 300 each 3  . lactose free nutrition (BOOST) LIQD Take 237 mLs by mouth daily as needed (takes if sugar is really low).    Marland Kitchen LANTUS 100 UNIT/ML injection INJECT 25 UNITS SUBCUTANEOUSLY EVERY MORNING (Patient taking differently: INJECT 35 UNITS SUBCUTANEOUSLY EVERY MORNING) 10 vial 5  . LORazepam (ATIVAN) 0.5 MG tablet TAKE 1 TO 2 TABLETS BY MOUTH ONCE DAILY AND AT BEDTIME AS NEEDED 60 tablet 2  . metFORMIN (GLUCOPHAGE-XR) 500 MG 24 hr tablet TAKE 1 TABLET BY MOUTH ONCE DAILY WITH BREAKFAST 90 tablet 1  . metoprolol tartrate (LOPRESSOR) 25 MG tablet TAKE ONE TABLET BY MOUTH TWICE DAILY 180 tablet 3  . Multiple Vitamin (MULTIVITAMIN) capsule Take 1 capsule by mouth daily.    Marland Kitchen omeprazole (PRILOSEC) 40 MG capsule TAKE ONE CAPSULE BY MOUTH ONCE DAILY 90 capsule 3  . Pancrelipase, Lip-Prot-Amyl, (CREON) 24000-76000 units CPEP Take 1-2 capsules by mouth See admin instructions. Takes 2 caps 3 times daily with meals and 1  with snacks    . potassium chloride (K-DUR) 10 MEQ tablet TAKE ONE TABLET BY MOUTH ONCE DAILY (Patient taking differently: TAKE ONE TABLET BY MOUTH ONCE DAILY AS NEEDED TAKES WITH FUROSEMIDE) 90 tablet 2  . promethazine (PHENERGAN) 12.5 MG tablet Take 0.5-1 tablets (6.25-12.5 mg total) by mouth every 6 (six) hours as needed for nausea or vomiting. 30 tablet 0  . rosuvastatin (CRESTOR) 5 MG tablet TAKE 1 TABLET BY MOUTH ONCE DAILY 30 tablet 9  . Simethicone (GAS-X EXTRA STRENGTH) 125 MG CAPS Take 125 mg by mouth 2 (two) times daily as needed (GAS).    Marland Kitchen  triamcinolone cream (KENALOG) 0.1 % APPLY  CREAM EXTERNALLY TWICE DAILY 60 g 1   No current facility-administered medications on file prior to visit.    Allergies  Allergen Reactions  . Calcium-Containing Compounds Nausea Only  . Morphine And Related Other (See Comments)    Hallucination  . Raloxifene Itching    Evista- Face and eyes burning  . Vitamin D Analogs Nausea Only   Social History   Socioeconomic History  . Marital status: Widowed    Spouse name: Not on file  . Number of children: 5  . Years of education: Not on file  . Highest education level: Not on file  Social Needs  . Financial resource strain: Not on file  . Food insecurity - worry: Not on file  . Food insecurity - inability: Not on file  . Transportation needs - medical: Not on file  . Transportation needs - non-medical: Not on file  Occupational History  . Occupation: homemaker  Tobacco Use  . Smoking status: Never Smoker  . Smokeless tobacco: Never Used  Substance and Sexual Activity  . Alcohol use: No    Alcohol/week: 0.0 oz  . Drug use: No  . Sexual activity: No  Other Topics Concern  . Not on file  Social History Narrative   Lives in [place].    nReview of Systems  All other systems reviewed and are negative.      Objective:   Physical Exam  Constitutional: She appears well-developed and well-nourished.  Cardiovascular: Normal rate,  regular rhythm and normal heart sounds.  Pulmonary/Chest: Effort normal and breath sounds normal. No respiratory distress. She has no wheezes. She has no rales.  Vitals reviewed.         Assessment & Plan:  Insulin-requiring or dependent type II diabetes mellitus ([place]) - Plan: CBC with Differential/Platelet, COMPLETE METABOLIC PANEL WITH GFR, Hemoglobin A1c  I have several concerns.  First I am concerned that the meter may be an accurate.  I would anticipate someone who is been 300-500 for several months would have lost weight not gained weight.  I would also expect her  to be endorsing polyuria and polydipsia which she is not.  Second I am concerned that if the meter is accurate, the insulin is not being drawn up correctly.  I do not believe that there is any malicious intent but it could be a simple mistake that is causing her not to take the requisite amount of Lantus.  Therefore I will check a hemoglobin A1c.  If hemoglobin A1c is greater than 10-12 as I would anticipate with the sugars, I would recommend increasing Lantus to 45 units a day.  I would then have the patient increase her Lantus by 1 unit every day until fasting sugars in the morning are under 200.  Spent more than 15 minutes with the family explaining how to change and titrate her insulin.  They are able to explain it back to me so that I am confident they understand it.  They are to bring her meter by this afternoon so that we can check its accuracy against our own meter.  Patient will return in 1 week with a log of her sugar value so that we can make another gross adjustment in her insulin dose

## 2017-05-05 LAB — CBC WITH DIFFERENTIAL/PLATELET
Basophils Absolute: 28 cells/uL (ref 0–200)
Basophils Relative: 0.4 %
Eosinophils Absolute: 97 cells/uL (ref 15–500)
Eosinophils Relative: 1.4 %
HCT: 37.5 % (ref 35.0–45.0)
Hemoglobin: 12.9 g/dL (ref 11.7–15.5)
Lymphs Abs: 1725 cells/uL (ref 850–3900)
MCH: 29.1 pg (ref 27.0–33.0)
MCHC: 34.4 g/dL (ref 32.0–36.0)
MCV: 84.7 fL (ref 80.0–100.0)
MONOS PCT: 7.5 %
MPV: 11.7 fL (ref 7.5–12.5)
NEUTROS PCT: 65.7 %
Neutro Abs: 4533 cells/uL (ref 1500–7800)
PLATELETS: 152 10*3/uL (ref 140–400)
RBC: 4.43 10*6/uL (ref 3.80–5.10)
RDW: 12.4 % (ref 11.0–15.0)
TOTAL LYMPHOCYTE: 25 %
WBC: 6.9 10*3/uL (ref 3.8–10.8)
WBCMIX: 518 {cells}/uL (ref 200–950)

## 2017-05-05 LAB — COMPLETE METABOLIC PANEL WITH GFR
AG RATIO: 1.9 (calc) (ref 1.0–2.5)
ALKALINE PHOSPHATASE (APISO): 44 U/L (ref 33–130)
ALT: 19 U/L (ref 6–29)
AST: 22 U/L (ref 10–35)
Albumin: 4.7 g/dL (ref 3.6–5.1)
BILIRUBIN TOTAL: 0.7 mg/dL (ref 0.2–1.2)
BUN/Creatinine Ratio: 17 (calc) (ref 6–22)
BUN: 19 mg/dL (ref 7–25)
CHLORIDE: 97 mmol/L — AB (ref 98–110)
CO2: 27 mmol/L (ref 20–32)
Calcium: 9.7 mg/dL (ref 8.6–10.4)
Creat: 1.11 mg/dL — ABNORMAL HIGH (ref 0.60–0.88)
GFR, EST AFRICAN AMERICAN: 51 mL/min/{1.73_m2} — AB (ref >=60)
GFR, Est Non African American: 44 mL/min/{1.73_m2} — ABNORMAL LOW (ref >=60)
Globulin: 2.5 g/dL (calc) (ref 1.9–3.7)
Glucose, Bld: 188 mg/dL — ABNORMAL HIGH (ref 65–99)
Potassium: 4.6 mmol/L (ref 3.5–5.3)
Sodium: 135 mmol/L (ref 135–146)
Total Protein: 7.2 g/dL (ref 6.1–8.1)

## 2017-05-05 LAB — HEMOGLOBIN A1C
HEMOGLOBIN A1C: 6.3 %{Hb} — AB (ref <5.7)
MEAN PLASMA GLUCOSE: 134 (calc)
eAG (mmol/L): 7.4 (calc)

## 2017-05-11 ENCOUNTER — Ambulatory Visit (INDEPENDENT_AMBULATORY_CARE_PROVIDER_SITE_OTHER): Payer: Medicare HMO | Admitting: Family Medicine

## 2017-05-11 ENCOUNTER — Encounter: Payer: Self-pay | Admitting: Family Medicine

## 2017-05-11 VITALS — BP 148/70 | HR 80 | Temp 97.6°F | Resp 16 | Ht 63.0 in | Wt 149.0 lb

## 2017-05-11 DIAGNOSIS — E119 Type 2 diabetes mellitus without complications: Secondary | ICD-10-CM | POA: Diagnosis not present

## 2017-05-11 DIAGNOSIS — Z794 Long term (current) use of insulin: Secondary | ICD-10-CM | POA: Diagnosis not present

## 2017-05-11 MED ORDER — INSULIN SYRINGES (DISPOSABLE) U-100 1 ML MISC: 3 refills | Status: DC

## 2017-05-11 MED ORDER — INSULIN GLARGINE 100 UNIT/ML ~~LOC~~ SOLN: SUBCUTANEOUS | 5 refills | Status: DC

## 2017-05-11 NOTE — Progress Notes (Signed)
Subjective:    Patient ID: Misty Edwards, female    DOB: [birth year], 84 y.o.   MRN: [id number]  HPI  05/04/17 Patient has not been seen since last year.  She is currently on 35 units of Lantus once a day in the morning.  She is also on metformin.  However she states over the last several months, her fasting blood sugars in the morning are averaging in the 300s.  Her postprandial sugars in the afternoon can vary anywhere from 200-500 but are typically near 300.  Her grandson is drawing up her insulin and prefilled syringes and placing them in the refrigerator for her because she is unable to see to draw up the insulin herself.  Therefore I am not certain if the insulin is being drawn up correctly.  I am also not certain if her meter is accurate.  She denies any polyuria, polydipsia.   Wt Readings from Last 3 Encounters:  05/11/17 149 lb (67.6 kg)  05/04/17 146 lb (66.2 kg)  11/14/16 141 lb (64 kg)   Her weight is remarkably stable given the fact her sugars have apparently been over 300 for several months.  At that time, my plan was: I have several concerns.  First I am concerned that the meter may be an accurate.  I would anticipate someone who is been 300-500 for several months would have lost weight not gained weight.  I would also expect her  to be endorsing polyuria and polydipsia which she is not.  Second I am concerned that if the meter is accurate, the insulin is not being drawn up correctly.  I do not believe that there is any malicious intent but it could be a simple mistake that is causing her not to take the requisite amount of Lantus.  Therefore I will check a hemoglobin A1c.  If hemoglobin A1c is greater than 10-12 as I would anticipate with the sugars, I would recommend increasing Lantus to 45 units a day.  I would then have the patient increase her Lantus by 1 unit every day until fasting sugars in the morning are under 200.  Spent more than 15 minutes with the family explaining how to  change and titrate her insulin.  They are able to explain it back to me so that I am confident they understand it.  They are to bring her meter by this afternoon so that we can check its accuracy against our own meter.  Patient will return in 1 week with a log of her sugar value so that we can make another gross adjustment in her insulin dose  05/11/17 Patient's meter was confirmed to be accurate.  Hemoglobin A1c was well controlled at 6.1 suggesting that her average blood sugar was excellent.  However the patient returns today taking 50 units of Lantus once a day and also requiring 10-15 units of sliding scale insulin depending upon her sugar values with widely varying blood sugars.  Her a.m. blood sugars range between 258 and 300 when she wakes up first thing in the morning fasting.  She then takes 50 units of Lantus.  By lunchtime, her sugars plummet and range between 59 and 96 with the vast majority being below 74.  At suppertime her sugars are between 102 and 196.  However by bedtime her sugars are averaging between 182 and 247. Past Medical History:  Diagnosis Date  . Acute biliary pancreatitis 07/2002   thia was in 07/2004:she still has pseudocyst in tail of  pancreas measuring 54 x 33 mm   . Adrenal adenoma    bilateral  . Anxiety   . Chronic pancreatitis ([place])   . Detached retina   . Diabetes mellitus ([place])   . Diverticulosis   . History of carcinoma in situ of breast 1988  . Hyperlipidemia   . Hypertension   . IBS (irritable bowel syndrome)   . Legally blind   . Osteoarthritis   . Osteopenia   . Upper GI bleed September2004   secondary to gastritis   Past Surgical History:  Procedure Laterality Date  . CARDIAC CATHETERIZATION N/A 08/24/2014   Procedure: Left Heart Cath and Coronary Angiography;  Surgeon: Leonie Man, MD;  Location: [facility];  Service: Cardiovascular;  Laterality: N/A;  . COLONOSCOPY  08/15/05   few tiny diverticula at sigmoid colon/external hemorrhoids  but no polyps  . COLONOSCOPY  01/08/2012   XTK:[id number] diverticulosis. Next colonoscopy in 12/2016  . CORONARY ARTERY BYPASS GRAFT N/A 08/24/2014   Procedure: CORONARY ARTERY BYPASS GRAFTING (CABG) x three, using left internal mammary artery and right leg greater saphenous vein harvested endoscopically;  Surgeon: Ivin Poot, MD;  Location: [facility];  Service: Open Heart Surgery;  Laterality: N/A;  . ECTOPIC PREGNANCY SURGERY  1950's  . ERCP with sphincterotomy  07/2002  . ESOPHAGOGASTRODUODENOSCOPY      Gastritis of body.  Otherwise normal  . ESOPHAGOGASTRODUODENOSCOPY  01/08/2012   RMR: Few scattered gastric erosions of uncertain significance-status post biopsy. Minimal chronic inflammation, no H.pylori  . LAPAROSCOPIC CHOLECYSTECTOMY    . PANCREATIC PSEUDOCYST DRAINAGE  [id number]   drained percutaneously   . right mastectomy    . tacking up of her bladder    . TEE WITHOUT CARDIOVERSION N/A 08/24/2014   Procedure: TRANSESOPHAGEAL ECHOCARDIOGRAM (TEE);  Surgeon: Ivin Poot, MD;  Location: [facility];  Service: Open Heart Surgery;  Laterality: N/A;   Current Outpatient Medications on File Prior to Visit  Medication Sig Dispense Refill  . acetaminophen (TYLENOL) 500 MG tablet Take 500-1,000 mg by mouth 3 (three) times daily as needed for mild pain. Pain.     Marland Kitchen amLODipine (NORVASC) 5 MG tablet TAKE 1 TABLET BY MOUTH ONCE DAILY 90 tablet 3  . aspirin EC 81 MG EC tablet Take 1 tablet (81 mg total) by mouth daily.    . cetirizine (ZYRTEC) 10 MG tablet TAKE ONE TABLET BY MOUTH ONCE DAILY 90 tablet 3  . CREON 24000-76000 units CPEP TAKE TWO CAPSULES BY MOUTH WITH MEALS AND ONE CAP WITH SNACKS 720 capsule 3  . cycloSPORINE (RESTASIS) 0.05 % ophthalmic emulsion Place 1 drop into both eyes 3 (three) times daily.     . diphenhydrAMINE (BENADRYL) 25 mg capsule Take 25 mg by mouth daily as needed for allergies.    Marland Kitchen docusate sodium (COLACE) 100 MG capsule Take 1 capsule (100 mg total) by mouth 2 (two)  times daily as needed for mild constipation. 60 capsule 5  . ferrous sulfate 325 (65 FE) MG tablet Take 325 mg by mouth daily with breakfast.    . FLUoxetine (PROZAC) 20 MG tablet TAKE ONE TABLET BY MOUTH ONCE DAILY 90 tablet 3  . fluticasone (FLONASE) 50 MCG/ACT nasal spray Place into both nostrils daily as needed for allergies or rhinitis.    . furosemide (LASIX) 20 MG tablet Take 1 tablet (20 mg total) by mouth daily. 90 tablet 2  . GLUCERNA (GLUCERNA) LIQD Take 237 mLs by mouth daily as needed (takes if sugar  is low).    Marland Kitchen glucose blood (ONE TOUCH ULTRA TEST) test strip USE TO TEST UP TO 5 TIMES DAILY AS DIRECTED. 450 each 3  . hydrALAZINE (APRESOLINE) 25 MG tablet Take 1 tablet (25 mg total) by mouth 3 (three) times daily. 90 tablet 0  . insulin aspart (NOVOLOG) 100 UNIT/ML injection Pt uses sliding scale - E11.65 (Patient taking differently: Inject 2 Units into the skin 3 (three) times daily with meals. Pt uses sliding scale - E11.65) 10 mL 11  . LORazepam (ATIVAN) 0.5 MG tablet TAKE 1 TO 2 TABLETS BY MOUTH ONCE DAILY AND AT BEDTIME AS NEEDED 60 tablet 2  . metFORMIN (GLUCOPHAGE-XR) 500 MG 24 hr tablet TAKE 1 TABLET BY MOUTH ONCE DAILY WITH BREAKFAST 90 tablet 1  . metoprolol tartrate (LOPRESSOR) 25 MG tablet TAKE ONE TABLET BY MOUTH TWICE DAILY 180 tablet 3  . Multiple Vitamin (MULTIVITAMIN) capsule Take 1 capsule by mouth daily.    Marland Kitchen omeprazole (PRILOSEC) 40 MG capsule TAKE ONE CAPSULE BY MOUTH ONCE DAILY 90 capsule 3  . Pancrelipase, Lip-Prot-Amyl, (CREON) 24000-76000 units CPEP Take 1-2 capsules by mouth See admin instructions. Takes 2 caps 3 times daily with meals and 1 with snacks    . potassium chloride (K-DUR) 10 MEQ tablet TAKE ONE TABLET BY MOUTH ONCE DAILY (Patient taking differently: TAKE ONE TABLET BY MOUTH ONCE DAILY AS NEEDED TAKES WITH FUROSEMIDE) 90 tablet 2  . promethazine (PHENERGAN) 12.5 MG tablet Take 0.5-1 tablets (6.25-12.5 mg total) by mouth every 6 (six) hours as needed  for nausea or vomiting. 30 tablet 0  . rosuvastatin (CRESTOR) 5 MG tablet TAKE 1 TABLET BY MOUTH ONCE DAILY 30 tablet 9  . Simethicone (GAS-X EXTRA STRENGTH) 125 MG CAPS Take 125 mg by mouth 2 (two) times daily as needed (GAS).    Marland Kitchen triamcinolone cream (KENALOG) 0.1 % APPLY  CREAM EXTERNALLY TWICE DAILY 60 g 1   No current facility-administered medications on file prior to visit.    Allergies  Allergen Reactions  . Calcium-Containing Compounds Nausea Only  . Morphine And Related Other (See Comments)    Hallucination  . Raloxifene Itching    Evista- Face and eyes burning  . Vitamin D Analogs Nausea Only   Social History   Socioeconomic History  . Marital status: Widowed    Spouse name: Not on file  . Number of children: 5  . Years of education: Not on file  . Highest education level: Not on file  Social Needs  . Financial resource strain: Not on file  . Food insecurity - worry: Not on file  . Food insecurity - inability: Not on file  . Transportation needs - medical: Not on file  . Transportation needs - non-medical: Not on file  Occupational History  . Occupation: homemaker  Tobacco Use  . Smoking status: Never Smoker  . Smokeless tobacco: Never Used  Substance and Sexual Activity  . Alcohol use: No    Alcohol/week: 0.0 oz  . Drug use: No  . Sexual activity: No  Other Topics Concern  . Not on file  Social History Narrative   Lives in [place].    nReview of Systems  All other systems reviewed and are negative.      Objective:   Physical Exam  Constitutional: She appears well-developed and well-nourished.  Cardiovascular: Normal rate, regular rhythm and normal heart sounds.  Pulmonary/Chest: Effort normal and breath sounds normal. No respiratory distress. She has no wheezes. She has no rales.  Vitals reviewed.         Assessment & Plan:  Insulin-requiring or dependent type II diabetes mellitus ([place]) While the patient's average sugars are good her  actual sugar diary shows a roller coaster of sugar values.  I have recommended discontinuing rapid acting insulin sliding scale which I believe is causing the patient to plummet after her morning dose.  I want her to take her Lantus 50 units in the morning and splitting it to 25 units twice daily and see if we can cause her blood sugars to become more consistent and then gradually up titrate Lantus to achieve blood sugars between 150 and 200 on a consistent basis.  Recheck sugars in 1 week

## 2017-05-15 ENCOUNTER — Encounter: Payer: Self-pay | Admitting: Cardiology

## 2017-05-15 ENCOUNTER — Ambulatory Visit: Payer: Medicare HMO | Admitting: Cardiology

## 2017-05-15 VITALS — BP 158/66 | HR 63 | Ht 63.0 in | Wt 147.0 lb

## 2017-05-15 DIAGNOSIS — I2510 Atherosclerotic heart disease of native coronary artery without angina pectoris: Secondary | ICD-10-CM | POA: Diagnosis not present

## 2017-05-15 DIAGNOSIS — R0789 Other chest pain: Secondary | ICD-10-CM | POA: Diagnosis not present

## 2017-05-15 DIAGNOSIS — Z79899 Other long term (current) drug therapy: Secondary | ICD-10-CM | POA: Diagnosis not present

## 2017-05-15 DIAGNOSIS — E782 Mixed hyperlipidemia: Secondary | ICD-10-CM

## 2017-05-15 DIAGNOSIS — I10 Essential (primary) hypertension: Secondary | ICD-10-CM | POA: Diagnosis not present

## 2017-05-15 MED ORDER — LISINOPRIL 2.5 MG PO TABS: 2.5000 mg | ORAL_TABLET | Freq: Every day | ORAL | 3 refills | Status: DC

## 2017-05-15 MED ORDER — PANTOPRAZOLE SODIUM 40 MG PO TBEC: 40.0000 mg | DELAYED_RELEASE_TABLET | Freq: Every day | ORAL | 3 refills | Status: DC

## 2017-05-15 NOTE — Progress Notes (Signed)
Clinical Summary Ms. Ridge is a 84 y.o.female seen today for follow up of the following medical problems.   1. CAD - history of emergent CABG June 2016 after presenting with NSTEMI - most recent echo 06/2015 LVEF 60-65%, grade II diastolic dysfunction - beta blocker dosing limited due to bradycardia.     - recent chest pain. Pressure like pain left sided, 4-5/10 in severity. Mainly occurs at rest. No other associated symptoms. Lasts 10-15 minutes. Better with tums. Occasional belching. No relation to food.Can be positional.   2. HTN - norvasc decreased to 5mg  daily, there was some question if the 10mg  dosing was contributing to LE edema.  - enalapril stopped 09/2016 due to low bp's and hypotension.  - compliant with meds  3. Hyperlipidemia - 07/2016 TC 106 TG 85 HDL 67 LDL 22 - compliant with statin  4. Mitral regurgitation - moderate by echo 06/2015.  - 10/2016 mild MR.  - no recent symptoms    Past Medical History:  Diagnosis Date  . Acute biliary pancreatitis 07/2002   thia was in 07/2004:she still has pseudocyst in tail of pancreas measuring 54 x 33 mm   . Adrenal adenoma    bilateral  . Anxiety   . Chronic pancreatitis ([place])   . Detached retina   . Diabetes mellitus ([place])   . Diverticulosis   . History of carcinoma in situ of breast 1988  . Hyperlipidemia   . Hypertension   . IBS (irritable bowel syndrome)   . Legally blind   . Osteoarthritis   . Osteopenia   . Upper GI bleed September2004   secondary to gastritis     Allergies  Allergen Reactions  . Calcium-Containing Compounds Nausea Only  . Morphine And Related Other (See Comments)    Hallucination  . Raloxifene Itching    Evista- Face and eyes burning  . Vitamin D Analogs Nausea Only     Current Outpatient Medications  Medication Sig Dispense Refill  . acetaminophen (TYLENOL) 500 MG tablet Take 500-1,000 mg by mouth 3 (three) times daily as needed for mild pain. Pain.     Marland Kitchen amLODipine  (NORVASC) 5 MG tablet TAKE 1 TABLET BY MOUTH ONCE DAILY 90 tablet 3  . aspirin EC 81 MG EC tablet Take 1 tablet (81 mg total) by mouth daily.    . cetirizine (ZYRTEC) 10 MG tablet TAKE ONE TABLET BY MOUTH ONCE DAILY 90 tablet 3  . CREON 24000-76000 units CPEP TAKE TWO CAPSULES BY MOUTH WITH MEALS AND ONE CAP WITH SNACKS 720 capsule 3  . cycloSPORINE (RESTASIS) 0.05 % ophthalmic emulsion Place 1 drop into both eyes 3 (three) times daily.     . diphenhydrAMINE (BENADRYL) 25 mg capsule Take 25 mg by mouth daily as needed for allergies.    Marland Kitchen docusate sodium (COLACE) 100 MG capsule Take 1 capsule (100 mg total) by mouth 2 (two) times daily as needed for mild constipation. 60 capsule 5  . ferrous sulfate 325 (65 FE) MG tablet Take 325 mg by mouth daily with breakfast.    . FLUoxetine (PROZAC) 20 MG tablet TAKE ONE TABLET BY MOUTH ONCE DAILY 90 tablet 3  . fluticasone (FLONASE) 50 MCG/ACT nasal spray Place into both nostrils daily as needed for allergies or rhinitis.    . furosemide (LASIX) 20 MG tablet Take 1 tablet (20 mg total) by mouth daily. 90 tablet 2  . GLUCERNA (GLUCERNA) LIQD Take 237 mLs by mouth daily as needed (takes if  sugar is low).    Marland Kitchen glucose blood (ONE TOUCH ULTRA TEST) test strip USE TO TEST UP TO 5 TIMES DAILY AS DIRECTED. 450 each 3  . hydrALAZINE (APRESOLINE) 25 MG tablet Take 1 tablet (25 mg total) by mouth 3 (three) times daily. 90 tablet 0  . insulin aspart (NOVOLOG) 100 UNIT/ML injection Pt uses sliding scale - E11.65 (Patient taking differently: Inject 2 Units into the skin 3 (three) times daily with meals. Pt uses sliding scale - E11.65) 10 mL 11  . insulin glargine (LANTUS) 100 UNIT/ML injection INJECT 50 UNITS SUBCUTANEOUSLY EVERY MORNING 10 vial 5  . Insulin Syringes, Disposable, U-100 1 ML MISC 1/2 inch 31 g 300 each 3  . LORazepam (ATIVAN) 0.5 MG tablet TAKE 1 TO 2 TABLETS BY MOUTH ONCE DAILY AND AT BEDTIME AS NEEDED 60 tablet 2  . metFORMIN (GLUCOPHAGE-XR) 500 MG 24 hr  tablet TAKE 1 TABLET BY MOUTH ONCE DAILY WITH BREAKFAST 90 tablet 1  . metoprolol tartrate (LOPRESSOR) 25 MG tablet TAKE ONE TABLET BY MOUTH TWICE DAILY 180 tablet 3  . Multiple Vitamin (MULTIVITAMIN) capsule Take 1 capsule by mouth daily.    Marland Kitchen omeprazole (PRILOSEC) 40 MG capsule TAKE ONE CAPSULE BY MOUTH ONCE DAILY 90 capsule 3  . Pancrelipase, Lip-Prot-Amyl, (CREON) 24000-76000 units CPEP Take 1-2 capsules by mouth See admin instructions. Takes 2 caps 3 times daily with meals and 1 with snacks    . potassium chloride (K-DUR) 10 MEQ tablet TAKE ONE TABLET BY MOUTH ONCE DAILY (Patient taking differently: TAKE ONE TABLET BY MOUTH ONCE DAILY AS NEEDED TAKES WITH FUROSEMIDE) 90 tablet 2  . promethazine (PHENERGAN) 12.5 MG tablet Take 0.5-1 tablets (6.25-12.5 mg total) by mouth every 6 (six) hours as needed for nausea or vomiting. 30 tablet 0  . rosuvastatin (CRESTOR) 5 MG tablet TAKE 1 TABLET BY MOUTH ONCE DAILY 30 tablet 9  . Simethicone (GAS-X EXTRA STRENGTH) 125 MG CAPS Take 125 mg by mouth 2 (two) times daily as needed (GAS).    Marland Kitchen triamcinolone cream (KENALOG) 0.1 % APPLY  CREAM EXTERNALLY TWICE DAILY 60 g 1   No current facility-administered medications for this visit.      Past Surgical History:  Procedure Laterality Date  . CARDIAC CATHETERIZATION N/A 08/24/2014   Procedure: Left Heart Cath and Coronary Angiography;  Surgeon: Leonie Man, MD;  Location: [facility];  Service: Cardiovascular;  Laterality: N/A;  . COLONOSCOPY  08/15/05   few tiny diverticula at sigmoid colon/external hemorrhoids but no polyps  . COLONOSCOPY  01/08/2012   INO:[id number] diverticulosis. Next colonoscopy in 12/2016  . CORONARY ARTERY BYPASS GRAFT N/A 08/24/2014   Procedure: CORONARY ARTERY BYPASS GRAFTING (CABG) x three, using left internal mammary artery and right leg greater saphenous vein harvested endoscopically;  Surgeon: Ivin Poot, MD;  Location: [facility];  Service: Open Heart Surgery;  Laterality:  N/A;  . ECTOPIC PREGNANCY SURGERY  1950's  . ERCP with sphincterotomy  07/2002  . ESOPHAGOGASTRODUODENOSCOPY      Gastritis of body.  Otherwise normal  . ESOPHAGOGASTRODUODENOSCOPY  01/08/2012   RMR: Few scattered gastric erosions of uncertain significance-status post biopsy. Minimal chronic inflammation, no H.pylori  . LAPAROSCOPIC CHOLECYSTECTOMY    . PANCREATIC PSEUDOCYST DRAINAGE  [id number]   drained percutaneously   . right mastectomy    . tacking up of her bladder    . TEE WITHOUT CARDIOVERSION N/A 08/24/2014   Procedure: TRANSESOPHAGEAL ECHOCARDIOGRAM (TEE);  Surgeon: Ivin Poot, MD;  Location: [facility];  Service: Open Heart Surgery;  Laterality: N/A;     Allergies  Allergen Reactions  . Calcium-Containing Compounds Nausea Only  . Morphine And Related Other (See Comments)    Hallucination  . Raloxifene Itching    Evista- Face and eyes burning  . Vitamin D Analogs Nausea Only      Family History  Problem Relation Age of Onset  . Ovarian cancer Mother        passed  . Colon cancer Father 36       passed away in his 13's  . Colon cancer Brother 26       surgery inhis 50's doing well now  . Heart attack Brother        passed away age 32  . Pancreatitis Brother   . Kidney disease Daughter   . Leukemia Son      Social History Ms. Fullenwider reports that  has never smoked. she has never used smokeless tobacco. Ms. Marten reports that she does not drink alcohol.   Review of Systems CONSTITUTIONAL: No weight loss, fever, chills, weakness or fatigue.  HEENT: Eyes: No visual loss, blurred vision, double vision or yellow sclerae.No hearing loss, sneezing, congestion, runny nose or sore throat.  SKIN: No rash or itching.  CARDIOVASCULAR: per hpi RESPIRATORY: No shortness of breath, cough or sputum.  GASTROINTESTINAL: No anorexia, nausea, vomiting or diarrhea. No abdominal pain or blood.  GENITOURINARY: No burning on urination, no polyuria NEUROLOGICAL: No headache,  dizziness, syncope, paralysis, ataxia, numbness or tingling in the extremities. No change in bowel or bladder control.  MUSCULOSKELETAL: No muscle, back pain, joint pain or stiffness.  LYMPHATICS: No enlarged nodes. No history of splenectomy.  PSYCHIATRIC: No history of depression or anxiety.  ENDOCRINOLOGIC: No reports of sweating, cold or heat intolerance. No polyuria or polydipsia.  Marland Kitchen   Physical Examination Vitals:   05/15/17 0904  BP: (!) 158/66  Pulse: 63  SpO2: 96%   Vitals:   05/15/17 0904  Weight: 147 lb (66.7 kg)  Height: 5\' 3"  (1.6 m)    Gen: resting comfortably, no acute distress HEENT: no scleral icterus, pupils equal round and reactive, no palptable cervical adenopathy,  CV: RRR, no m/r/g, no jvd Resp: Clear to auscultation bilaterally GI: abdomen is soft, non-tender, non-distended, normal bowel sounds, no hepatosplenomegaly MSK: extremities are warm, no edema.  Skin: warm, no rash Neuro:  no focal deficits Psych: appropriate affect   Diagnostic Studies 08/2014 cath  Severe multivessel CAD including left main and mid right.  Distal LM lesion, 95% stenosed.  Ost Cx lesion, 95% stenosed.  Ost LAD to Mid LAD lesion, 75% stenosed. Mid LAD lesion, 100% stenosed. After 2 septal perforators. Also noted is a proximal Ost 1st Diag to 1st Diag lesion, 100% stenosed.  Prox RCA to Mid RCA lesion, diffuse 70% stenosed with focal Mid RCA lesion, 95% stenosed.  Relatively well-preserved LV function with mild mid inferior hypokinesis and mild MR. Only minimally elevated LVEDP   06/2015 echo Study Conclusions  - Left ventricle: The cavity size was normal. Wall thickness was normal. Systolic function was normal. The estimated ejection fraction was in the range of 60% to 65%. Features are consistent with a pseudonormal left ventricular filling pattern, with concomitant abnormal relaxation and increased filling pressure (grade 2 diastolic dysfunction).  Doppler parameters are consistent with high ventricular filling pressure. - Regional wall motion abnormality: Mild hypokinesis of the apical anterior and apical septal myocardium. - Aortic valve: Mildly calcified  annulus. Trileaflet. - Mitral valve: There was moderate regurgitation. - Left atrium: The atrium was moderately to severely dilated. - Pulmonic valve: There was mild regurgitation.     Assessment and Plan  1. CAD - s/p CABG in June 2016 - occasional atypical chest pain possibly GI related, continue to monitor at this time. Change to protonix 40mg  daily, stop omeprazole.    2. HTN -prior issues with low bp's. We will try very low dose lisinopril 2.5mg  daily for the CAD related benefits. Check BMET in 2 weeks  3. Hyperlipidemia -at goal, continue statin  4. Mitral regurgitation - continue to monitor at this time       Arnoldo Lenis, M.D.

## 2017-05-15 NOTE — Patient Instructions (Signed)
Medication Instructions:  Stop omeprazole   Start protonix 40 mg daily  Start lisinopril 2.5 mg daily    Labwork: 2 weeks  bmet  Testing/Procedures: none  Follow-Up: Your physician wants you to follow-up in: 6 months.  You will receive a reminder letter in the mail two months in advance. If you don't receive a letter, please call our office to schedule the follow-up appointment.   Any Other Special Instructions Will Be Listed Below (If Applicable).     If you need a refill on your cardiac medications before your next appointment, please call your pharmacy.

## 2017-05-21 ENCOUNTER — Encounter: Payer: Self-pay | Admitting: Cardiology

## 2017-06-01 ENCOUNTER — Other Ambulatory Visit: Payer: Self-pay | Admitting: Family Medicine

## 2017-06-01 NOTE — Telephone Encounter (Signed)
Ok to refill??  Last office visit 05/11/2017.  Last refill 01/21/2017, #2 refills.

## 2017-06-10 ENCOUNTER — Telehealth: Payer: Self-pay | Admitting: Family Medicine

## 2017-06-10 NOTE — Telephone Encounter (Signed)
Misty Edwards called and states that the pt is using Lantus 25 units bid and she has been sick and has not been by there to get her numbers however Dewayne told her that pt's bs has still been running in the 300's in the evening and has been giving her a sliding scale injection. She is due for a refill of her Lantus and wanted to know if you were going to increase it or not.

## 2017-06-11 NOTE — Telephone Encounter (Signed)
Insurance will not cover Lantus can we switch to Levemir, Engineer, agricultural or [place]??

## 2017-06-11 NOTE — Telephone Encounter (Signed)
Before any increase I would want to see the numbers so I can see the trend,  For example 100,150,180,200 in am    300,350,300,280 in pm Because her A1c was good so something is off and I do not want to bottom her out.  If truly >300 all the time in the evenings, yes I would up her lantus to 30 bid.

## 2017-06-11 NOTE — Telephone Encounter (Signed)
Switch to basaglar

## 2017-06-11 NOTE — Telephone Encounter (Signed)
Tried to call [name] - no answer and mailbox full

## 2017-06-12 NOTE — Telephone Encounter (Signed)
Misty Edwards aware to get some readings on her BS and will try to do next week. Also pt can not use the pens d/t/ her arthritis in her hands she needs vials. Misty Edwards is going to check on her lantus and if ins not covering it we will try to do a PA.

## 2017-06-15 ENCOUNTER — Telehealth: Payer: Self-pay | Admitting: Family Medicine

## 2017-06-15 NOTE — Telephone Encounter (Signed)
[name] called and states that with her ins her insulin is going up to $100 per bottle and she would require 2 bottles a month and she can not afford that. She states that you had mentioned her taking a pill and not doing insulin at all and would like to know what that is so she can call her ins to see how much it will cost her.?

## 2017-06-16 ENCOUNTER — Encounter: Payer: Self-pay | Admitting: Internal Medicine

## 2017-06-16 NOTE — Telephone Encounter (Signed)
Please verify her current dose of LANTUS.  Would switch to a comparable dose of 70/30 insulin.   Would also up metformin XR to 2000 mg poqam (currently on 500).

## 2017-06-16 NOTE — Telephone Encounter (Signed)
25 units bid

## 2017-06-18 NOTE — Telephone Encounter (Signed)
Tried to call [name] - no answer and mailbox is full

## 2017-06-18 NOTE — Telephone Encounter (Signed)
I would switch lantus to novolin 70/30 (cheaper) 25 with breakfast, 15 with supper.  Please remove lantus from med list and add new med.

## 2017-06-19 NOTE — Telephone Encounter (Signed)
Tried to call [name] - no answer and mailbox is full

## 2017-06-22 MED ORDER — INSULIN NPH (HUMAN) (ISOPHANE) 100 UNIT/ML ~~LOC~~ SUSP: SUBCUTANEOUS | 3 refills | Status: DC

## 2017-06-22 NOTE — Telephone Encounter (Signed)
Sent med to pharm with note to have family call us.

## 2017-06-23 ENCOUNTER — Telehealth: Payer: Self-pay | Admitting: Family Medicine

## 2017-06-23 NOTE — Telephone Encounter (Signed)
Spoke to [facility] and informed him of med change and directions

## 2017-06-23 NOTE — Telephone Encounter (Signed)
PA Submitted through [email or web address] for one touch strips d/t qty limit as pt checks her bs 5 x's a day.  Received the following - The plan will fax you a determination, typically within 1 to 5 business days.

## 2017-06-25 ENCOUNTER — Other Ambulatory Visit: Payer: Self-pay | Admitting: *Deleted

## 2017-06-25 DIAGNOSIS — R69 Illness, unspecified: Secondary | ICD-10-CM | POA: Diagnosis not present

## 2017-06-25 MED ORDER — LANCETS MISC: 3 refills | Status: DC

## 2017-06-26 ENCOUNTER — Other Ambulatory Visit: Payer: Self-pay | Admitting: Family Medicine

## 2017-06-29 MED ORDER — ONETOUCH DELICA LANCETS FINE MISC: 3 refills | Status: DC

## 2017-06-29 MED ORDER — GLUCOSE BLOOD VI STRP: ORAL_STRIP | 3 refills | Status: DC

## 2017-06-29 NOTE — Telephone Encounter (Signed)
Strips for 5 x's per day was approved through ins and rx sent to pharm.

## 2017-06-30 ENCOUNTER — Telehealth: Payer: Self-pay | Admitting: Family Medicine

## 2017-06-30 NOTE — Telephone Encounter (Signed)
Called and spoke to Misty Edwards - she is not taking the 70/30 yet as they had a surplus of Lantus therefore she is using Lantus 25 u bid and sliding scale at night on Novolin but not over 4 units. Her BS was 332 last night so she took 4 units and 1/2 metformin before bed.

## 2017-06-30 NOTE — Telephone Encounter (Signed)
-----   Message from Susy Frizzle, MD sent at 06/30/2017  7:34 AM EDT ----- Morning sugars are great.  Lunch sugars are acceptable for age.  Supper and bedtime sugars are too high.  I would add novolog 5 units WITH supper and leave 70/30 alone.

## 2017-07-01 DIAGNOSIS — R69 Illness, unspecified: Secondary | ICD-10-CM | POA: Diagnosis not present

## 2017-07-02 NOTE — Telephone Encounter (Signed)
Call placed to patient Misty Edwards 

## 2017-07-02 NOTE — Telephone Encounter (Signed)
Switch to 70/30 like we discussed and report values to me in 1 week so I can make adjustments on that since she won't be able to take lantus anymore.  I would not make any adjustments based on these sugars because it will change once she is on 70/30

## 2017-07-06 DIAGNOSIS — R69 Illness, unspecified: Secondary | ICD-10-CM | POA: Diagnosis not present

## 2017-07-06 MED ORDER — METOPROLOL TARTRATE 25 MG PO TABS: 25.0000 mg | ORAL_TABLET | Freq: Two times a day (BID) | ORAL | 3 refills | Status: DC

## 2017-07-06 MED ORDER — ONETOUCH ULTRA 2 W/DEVICE KIT: PACK | 0 refills | Status: DC

## 2017-07-06 MED ORDER — ONETOUCH DELICA LANCING DEV MISC: 1 refills | Status: DC

## 2017-07-06 NOTE — Telephone Encounter (Signed)
[name] aware of provider recommendations

## 2017-07-10 ENCOUNTER — Other Ambulatory Visit: Payer: Self-pay | Admitting: Family Medicine

## 2017-07-10 MED ORDER — METFORMIN HCL 1000 MG PO TABS: 2000.0000 mg | ORAL_TABLET | Freq: Every day | ORAL | 3 refills | Status: DC

## 2017-07-30 ENCOUNTER — Other Ambulatory Visit: Payer: Self-pay | Admitting: Family Medicine

## 2017-07-30 MED ORDER — INSULIN NPH ISOPHANE & REGULAR (70-30) 100 UNIT/ML ~~LOC~~ SUSP: SUBCUTANEOUS | 11 refills | Status: DC

## 2017-08-03 ENCOUNTER — Other Ambulatory Visit: Payer: Self-pay | Admitting: Family Medicine

## 2017-08-03 NOTE — Telephone Encounter (Signed)
Ok to refill??  Last office visit 05/11/2017.  Last refill 06/01/2017, #1 refill.

## 2017-08-04 ENCOUNTER — Other Ambulatory Visit: Payer: Self-pay | Admitting: Family Medicine

## 2017-08-05 ENCOUNTER — Other Ambulatory Visit: Payer: Self-pay | Admitting: *Deleted

## 2017-08-05 NOTE — Telephone Encounter (Signed)
Received duplicate request for refill.

## 2017-09-26 DIAGNOSIS — R69 Illness, unspecified: Secondary | ICD-10-CM | POA: Diagnosis not present

## 2017-09-30 ENCOUNTER — Other Ambulatory Visit: Payer: Self-pay | Admitting: Family Medicine

## 2017-09-30 DIAGNOSIS — R69 Illness, unspecified: Secondary | ICD-10-CM | POA: Diagnosis not present

## 2017-10-01 DIAGNOSIS — R69 Illness, unspecified: Secondary | ICD-10-CM | POA: Diagnosis not present

## 2017-10-01 NOTE — Telephone Encounter (Signed)
Requesting refill    Lorazepam  LOV: 05/11/17  LRF:  08/03/17

## 2017-10-15 ENCOUNTER — Other Ambulatory Visit: Payer: Self-pay | Admitting: *Deleted

## 2017-10-15 MED ORDER — METFORMIN HCL 1000 MG PO TABS: 2000.0000 mg | ORAL_TABLET | Freq: Every day | ORAL | 3 refills | Status: DC

## 2017-11-13 ENCOUNTER — Other Ambulatory Visit (HOSPITAL_COMMUNITY)
Admission: RE | Admit: 2017-11-13 | Discharge: 2017-11-13 | Disposition: A | Discharge status: Home / Self Care | Payer: Medicare HMO | Source: Ambulatory Visit | Attending: Cardiology | Admitting: Cardiology

## 2017-11-13 ENCOUNTER — Encounter: Payer: Self-pay | Admitting: Cardiology

## 2017-11-13 ENCOUNTER — Ambulatory Visit: Payer: Medicare HMO | Admitting: Cardiology

## 2017-11-13 VITALS — BP 130/72 | HR 69 | Ht 63.0 in | Wt 146.8 lb

## 2017-11-13 DIAGNOSIS — I10 Essential (primary) hypertension: Secondary | ICD-10-CM

## 2017-11-13 DIAGNOSIS — E782 Mixed hyperlipidemia: Secondary | ICD-10-CM | POA: Diagnosis not present

## 2017-11-13 DIAGNOSIS — I2510 Atherosclerotic heart disease of native coronary artery without angina pectoris: Secondary | ICD-10-CM

## 2017-11-13 DIAGNOSIS — K219 Gastro-esophageal reflux disease without esophagitis: Secondary | ICD-10-CM

## 2017-11-13 LAB — BASIC METABOLIC PANEL
Anion gap: 9 (ref 5–15)
BUN: 16 mg/dL (ref 8–23)
CALCIUM: 9.8 mg/dL (ref 8.9–10.3)
CHLORIDE: 101 mmol/L (ref 98–111)
CO2: 29 mmol/L (ref 22–32)
CREATININE: 0.85 mg/dL (ref 0.44–1.00)
GFR calc Af Amer: >60 mL/min (ref >60)
GFR calc non Af Amer: 59 mL/min — ABNORMAL LOW (ref >60)
Glucose, Bld: 74 mg/dL (ref 70–99)
Potassium: 4.5 mmol/L (ref 3.5–5.1)
Sodium: 139 mmol/L (ref 135–145)

## 2017-11-13 LAB — MAGNESIUM: Magnesium: 1.4 mg/dL — ABNORMAL LOW (ref 1.7–2.4)

## 2017-11-13 MED ORDER — FUROSEMIDE 20 MG PO TABS: 20.0000 mg | ORAL_TABLET | ORAL | 2 refills | Status: DC

## 2017-11-13 NOTE — Addendum Note (Signed)
Addended by: Barbarann Ehlers A on: 11/13/2017 12:34 PM   Modules accepted: Orders

## 2017-11-13 NOTE — Patient Instructions (Addendum)
Your physician wants you to follow-up in: 6 months with Dr.Branch You will receive a reminder letter in the mail two months in advance. If you don't receive a letter, please call our office to schedule the follow-up appointment.    Take lasix 20 mg every OTHER day   Get lab work : magnesium, bmet today    You have been referred to GI, Dr.Rourk, they will call you for an apt       No tests today     Thank you for choosing [facility] !

## 2017-11-13 NOTE — Progress Notes (Signed)
Clinical Summary Misty Edwards is a 84 y.o.female seen today for follow up of the following medical problems.   1. CAD - history of emergent CABG June 2016 after presenting with NSTEMI - most recent echo 06/2015 LVEF 60-65%, grade II diastolic dysfunction - beta blocker dosing limited due to bradycardia.   - ongoing chest pain. Has been most consistent with GI etiology. Mainly occurs at tight, better with tums or belching. We tried changing her to protonix last visit, no improvement in symptoms. No exertional symptoms.      2. HTN - norvasc decreased to 3m daily, there was some question if the 136mdosing was contributing to LE edema. - enalapril stopped 09/2016 due to low bp's and hypotension.   - she remains compliant with meds - recent orthostatic symptoms.   3. Hyperlipidemia -07/2016 TC 106 TG 85 HDL 67 LDL 22 - she is compliant with statin.   4. Mitral regurgitation - moderate by echo 06/2015.  - 10/2016 mild MR.  - denies any symptoms.   5. Falls - 3 falls over the last 3 weeks - last episode 3 weeks ago. Outside turnig off water. Tripped on wood timber.  - another fall after getting up to walk to bathroom. Had some diarrhea. Sitting on commode, stood up. Thinks maybe leg went to sleep, but not sure - another episode got out of bed. Felt dizzy with standing, fell to floor.  -  64-72 oz 2 L of fluid daily. Reports pcp has been limiting he rintake.  - taking lasix 2067mnce daily for prior LE edema which has been well controlled. .   Past Medical History:  Diagnosis Date  . Acute biliary pancreatitis 07/2002   thia was in 07/2004:she still has pseudocyst in tail of pancreas measuring 54 x 33 mm   . Adrenal adenoma    bilateral  . Anxiety   . [place] . Detached retina   . [place] . Diverticulosis   . History of carcinoma in situ of breast 1988  . Hyperlipidemia   . Hypertension   . IBS (irritable bowel syndrome)    . Legally blind   . Osteoarthritis   . Osteopenia   . Upper GI bleed September2004   secondary to gastritis     Allergies  Allergen Reactions  . Calcium-Containing Compounds Nausea Only  . Morphine And Related Other (See Comments)    Hallucination  . Raloxifene Itching    Evista- Face and eyes burning  . Vitamin D Analogs Nausea Only     Current Outpatient Medications  Medication Sig Dispense Refill  . acetaminophen (TYLENOL) 500 MG tablet Take 500-1,000 mg by mouth 3 (three) times daily as needed for mild pain. Pain.     . aMarland KitchenLODipine (NORVASC) 5 MG tablet TAKE 1 TABLET BY MOUTH ONCE DAILY 90 tablet 3  . aspirin EC 81 MG EC tablet Take 1 tablet (81 mg total) by mouth daily.    . Blood Glucose Monitoring Suppl (ONE TOUCH ULTRA 2) w/Device KIT Use to check BS up to 5 times daily 1 each 0  . CREON 24000-76000 units CPEP TAKE TWO CAPSULES BY MOUTH WITH MEALS AND ONE CAP WITH SNACKS 720 capsule 3  . cycloSPORINE (RESTASIS) 0.05 % ophthalmic emulsion Place 1 drop into both eyes 3 (three) times daily.     . diphenhydrAMINE (BENADRYL) 25 mg capsule Take 25 mg by mouth daily as needed for allergies.    .Marland Kitchen  docusate sodium (COLACE) 100 MG capsule Take 1 capsule (100 mg total) by mouth 2 (two) times daily as needed for mild constipation. 60 capsule 5  . EQ ALLERGY RELIEF, CETIRIZINE, 10 MG tablet TAKE 1 TABLET BY MOUTH ONCE DAILY 90 tablet 2  . ferrous sulfate 325 (65 FE) MG tablet Take 325 mg by mouth daily with breakfast.    . FLUoxetine (PROZAC) 20 MG tablet TAKE ONE TABLET BY MOUTH ONCE DAILY 90 tablet 3  . fluticasone (FLONASE) 50 MCG/ACT nasal spray Place into both nostrils daily as needed for allergies or rhinitis.    . furosemide (LASIX) 20 MG tablet Take 1 tablet (20 mg total) by mouth daily. 90 tablet 2  . GLUCERNA (GLUCERNA) LIQD Take 237 mLs by mouth daily as needed (takes if sugar is low).    Marland Kitchen glucose blood (ONE TOUCH ULTRA TEST) test strip USE TO TEST UP TO 5 TIMES DAILY AS  DIRECTED. 450 each 3  . hydrALAZINE (APRESOLINE) 25 MG tablet Take 1 tablet (25 mg total) by mouth 3 (three) times daily. 90 tablet 0  . insulin aspart (NOVOLOG) 100 UNIT/ML injection Pt uses sliding scale - E11.65 (Patient taking differently: Inject 2 Units into the skin 3 (three) times daily with meals. Pt uses sliding scale - E11.65) 10 mL 11  . insulin glargine (LANTUS) 100 UNIT/ML injection INJECT 50 UNITS SUBCUTANEOUSLY EVERY MORNING 10 vial 5  . insulin NPH Human (NOVOLIN N) 100 UNIT/ML injection 25 units with breakfast and 15 units with supper 10 mL 3  . insulin NPH-regular Human (NOVOLIN 70/30) (70-30) 100 UNIT/ML injection 25 units q am and 15 units q pm 10 mL 11  . Insulin Syringes, Disposable, U-100 1 ML MISC 1/2 inch 31 g 300 each 3  . Lancet Devices (ONE TOUCH DELICA LANCING DEV) MISC Use to check BS up to 5 times daily 1 each 1  . Lancets MISC Please dispense based on patient and insurance preference. Use as directed to monitor FSBS 3x daily. Dx: E11.65. 300 each 3  . lisinopril (PRINIVIL,ZESTRIL) 2.5 MG tablet Take 1 tablet (2.5 mg total) by mouth daily. 90 tablet 3  . LORazepam (ATIVAN) 0.5 MG tablet TAKE 1 TO 2 TABLETS BY MOUTH AT BEDTIME AS NEEDED FOR INSOMNIA 60 tablet 1  . metFORMIN (GLUCOPHAGE) 1000 MG tablet Take 2 tablets (2,000 mg total) by mouth daily with breakfast. 180 tablet 3  . metoprolol tartrate (LOPRESSOR) 25 MG tablet Take 1 tablet (25 mg total) by mouth 2 (two) times daily. 180 tablet 3  . Multiple Vitamin (MULTIVITAMIN) capsule Take 1 capsule by mouth daily.    Glory Rosebush DELICA LANCETS FINE MISC Check BS 5 x's a day 450 each 3  . Pancrelipase, Lip-Prot-Amyl, (CREON) 24000-76000 units CPEP Take 1-2 capsules by mouth See admin instructions. Takes 2 caps 3 times daily with meals and 1 with snacks    . pantoprazole (PROTONIX) 40 MG tablet Take 1 tablet (40 mg total) by mouth daily. 90 tablet 3  . potassium chloride (K-DUR) 10 MEQ tablet TAKE ONE TABLET BY MOUTH ONCE  DAILY AS NEEDED TAKES WITH FUROSEMIDE 90 tablet 2  . promethazine (PHENERGAN) 12.5 MG tablet Take 0.5-1 tablets (6.25-12.5 mg total) by mouth every 6 (six) hours as needed for nausea or vomiting. 30 tablet 0  . RELION INSULIN SYRINGE 1ML/31G 31G X 5/16" 1 ML MISC     . rosuvastatin (CRESTOR) 5 MG tablet TAKE 1 TABLET BY MOUTH ONCE DAILY 30 tablet 9  .  Simethicone (GAS-X EXTRA STRENGTH) 125 MG CAPS Take 125 mg by mouth 2 (two) times daily as needed (GAS).    Marland Kitchen triamcinolone cream (KENALOG) 0.1 % APPLY  CREAM EXTERNALLY TWICE DAILY 60 g 1   No current facility-administered medications for this visit.      Past Surgical History:  Procedure Laterality Date  . CARDIAC CATHETERIZATION N/A 08/24/2014   Procedure: Left Heart Cath and Coronary Angiography;  Surgeon: Leonie Man, MD;  Location: [facility];  Service: Cardiovascular;  Laterality: N/A;  . COLONOSCOPY  08/15/05   few tiny diverticula at sigmoid colon/external hemorrhoids but no polyps  . COLONOSCOPY  01/08/2012   XBL:[id number] diverticulosis. Next colonoscopy in 12/2016  . CORONARY ARTERY BYPASS GRAFT N/A 08/24/2014   Procedure: CORONARY ARTERY BYPASS GRAFTING (CABG) x three, using left internal mammary artery and right leg greater saphenous vein harvested endoscopically;  Surgeon: Ivin Poot, MD;  Location: [facility];  Service: Open Heart Surgery;  Laterality: N/A;  . ECTOPIC PREGNANCY SURGERY  1950's  . ERCP with sphincterotomy  07/2002  . ESOPHAGOGASTRODUODENOSCOPY      Gastritis of body.  Otherwise normal  . ESOPHAGOGASTRODUODENOSCOPY  01/08/2012   RMR: Few scattered gastric erosions of uncertain significance-status post biopsy. Minimal chronic inflammation, no H.pylori  . LAPAROSCOPIC CHOLECYSTECTOMY    . PANCREATIC PSEUDOCYST DRAINAGE  [id number]   drained percutaneously   . right mastectomy    . tacking up of her bladder    . TEE WITHOUT CARDIOVERSION N/A 08/24/2014   Procedure: TRANSESOPHAGEAL ECHOCARDIOGRAM (TEE);   Surgeon: Ivin Poot, MD;  Location: [facility];  Service: Open Heart Surgery;  Laterality: N/A;     Allergies  Allergen Reactions  . Calcium-Containing Compounds Nausea Only  . Morphine And Related Other (See Comments)    Hallucination  . Raloxifene Itching    Evista- Face and eyes burning  . Vitamin D Analogs Nausea Only      Family History  Problem Relation Age of Onset  . Ovarian cancer Mother        passed  . Colon cancer Father 37       passed away in his 19's  . Colon cancer Brother 52       surgery inhis 50's doing well now  . Heart attack Brother        passed away age 74  . Pancreatitis Brother   . Kidney disease Daughter   . Leukemia Son      Social History Ms. Clauson reports that she has never smoked. She has never used smokeless tobacco. Ms. Esquer reports that she does not drink alcohol.   Review of Systems CONSTITUTIONAL: No weight loss, fever, chills, weakness or fatigue.  HEENT: Eyes: No visual loss, blurred vision, double vision or yellow sclerae.No hearing loss, sneezing, congestion, runny nose or sore throat.  SKIN: No rash or itching.  CARDIOVASCULAR: per hpi RESPIRATORY: No shortness of breath, cough or sputum.  GASTROINTESTINAL: No anorexia, nausea, vomiting or diarrhea. No abdominal pain or blood.  GENITOURINARY: No burning on urination, no polyuria NEUROLOGICAL: +orthostatic dizziness.  MUSCULOSKELETAL: No muscle, back pain, joint pain or stiffness.  LYMPHATICS: No enlarged nodes. No history of splenectomy.  PSYCHIATRIC: No history of depression or anxiety.  ENDOCRINOLOGIC: No reports of sweating, cold or heat intolerance. No polyuria or polydipsia.  Marland Kitchen   Physical Examination There were no vitals filed for this visit. There were no vitals filed for this visit.  Gen: resting comfortably, no acute distress  HEENT: no scleral icterus, pupils equal round and reactive, no palptable cervical adenopathy,  CV Resp: Clear to auscultation  bilaterally GI: abdomen is soft, non-tender, non-distended, normal bowel sounds, no hepatosplenomegaly MSK: extremities are warm, no edema.  Skin: warm, no rash Neuro:  no focal deficits Psych: appropriate affect   Diagnostic Studies 08/2014 cath  Severe multivessel CAD including left main and mid right.  Distal LM lesion, 95% stenosed.  Ost Cx lesion, 95% stenosed.  Ost LAD to Mid LAD lesion, 75% stenosed. Mid LAD lesion, 100% stenosed. After 2 septal perforators. Also noted is a proximal Ost 1st Diag to 1st Diag lesion, 100% stenosed.  Prox RCA to Mid RCA lesion, diffuse 70% stenosed with focal Mid RCA lesion, 95% stenosed.  Relatively well-preserved LV function with mild mid inferior hypokinesis and mild MR. Only minimally elevated LVEDP   06/2015 echo Study Conclusions  - Left ventricle: The cavity size was normal. Wall thickness was normal. Systolic function was normal. The estimated ejection fraction was in the range of 60% to 65%. Features are consistent with a pseudonormal left ventricular filling pattern, with concomitant abnormal relaxation and increased filling pressure (grade 2 diastolic dysfunction). Doppler parameters are consistent with high ventricular filling pressure. - Regional wall motion abnormality: Mild hypokinesis of the apical anterior and apical septal myocardium. - Aortic valve: Mildly calcified annulus. Trileaflet. - Mitral valve: There was moderate regurgitation. - Left atrium: The atrium was moderately to severely dilated. - Pulmonic valve: There was mild regurgitation.       Assessment and Plan  1. CAD - s/p CABG in June 2016 - occasional atypical chest pain most consistent with GI etiology. We will refer to GI - continue current cardiac meds.  EKG today shows SR, chronic ST/T changes.    2. HTN - repeat BMET since starting lisinopril - recent orthostatic sytmpoms, her orthostatics are negative in clinic. Try  taking lasix just every other day.  - family will also update [place] on hydralazine dose, they are unsure how she is taking at home.   3. Hyperlipidemia - she has been at goal, continue current meds  4. GERD - refer to GI   F/u 6 months      Arnoldo Lenis, M.D.

## 2017-11-17 ENCOUNTER — Encounter: Payer: Self-pay | Admitting: Internal Medicine

## 2017-11-18 ENCOUNTER — Telehealth: Payer: Self-pay

## 2017-11-18 MED ORDER — MAGNESIUM OXIDE 400 MG PO CAPS: 400.0000 mg | ORAL_CAPSULE | Freq: Every day | ORAL | 3 refills | Status: DC

## 2017-11-18 NOTE — Telephone Encounter (Signed)
-----   Message from Arnoldo Lenis, MD sent at 11/18/2017  2:34 PM EDT ----- Low magnesium, start magnesium oxide 400mg  bid x 4 days, then 400mg  daily  Zandra Abts MD

## 2017-12-04 ENCOUNTER — Other Ambulatory Visit: Payer: Self-pay | Admitting: Family Medicine

## 2017-12-04 NOTE — Telephone Encounter (Signed)
Ok to refill??  Last office visit 05/11/2017.  Last refill 10/01/2017.

## 2017-12-08 ENCOUNTER — Telehealth: Payer: Self-pay | Admitting: Family Medicine

## 2017-12-08 NOTE — Telephone Encounter (Signed)
Pt fell Saturday night face down and she is now complaining of pain in her right chest below her breast and they think she may have fractured a rib but they know there is nothing you can do for it but she was wondering if we could call her in something for the pain?

## 2017-12-09 NOTE — Telephone Encounter (Signed)
NTBS to rule out serious injury

## 2017-12-09 NOTE — Telephone Encounter (Signed)
Tried to call Misty Edwards - no answer and no vm

## 2017-12-15 ENCOUNTER — Telehealth: Payer: Self-pay | Admitting: Cardiology

## 2017-12-15 NOTE — Telephone Encounter (Signed)
Pt is wanting to know if she's supposed to have more labs done since being on the magnesium

## 2017-12-15 NOTE — Telephone Encounter (Signed)
Returned pt call. Advised pt that there is not an order in for labs at this time. I will message Dr. Harl Bowie to see what she needs to repeat.

## 2017-12-15 NOTE — Telephone Encounter (Signed)
Tried to call Misty Edwards - no answer and no vm

## 2017-12-19 DIAGNOSIS — R69 Illness, unspecified: Secondary | ICD-10-CM | POA: Diagnosis not present

## 2017-12-24 NOTE — Telephone Encounter (Signed)
No return call - closing encounter 

## 2017-12-30 ENCOUNTER — Other Ambulatory Visit: Payer: Self-pay | Admitting: Family Medicine

## 2017-12-30 DIAGNOSIS — R69 Illness, unspecified: Secondary | ICD-10-CM | POA: Diagnosis not present

## 2018-01-01 ENCOUNTER — Ambulatory Visit: Payer: Medicare HMO | Admitting: Internal Medicine

## 2018-01-26 ENCOUNTER — Other Ambulatory Visit: Payer: Self-pay | Admitting: Family Medicine

## 2018-01-26 NOTE — Telephone Encounter (Signed)
Ok to refill??  Last office visit 05/11/2017.  Last refill 12/04/2017, #1 refills.

## 2018-02-15 ENCOUNTER — Other Ambulatory Visit: Payer: Self-pay | Admitting: Family Medicine

## 2018-02-15 DIAGNOSIS — F32A Depression, unspecified: Secondary | ICD-10-CM

## 2018-02-15 DIAGNOSIS — F329 Major depressive disorder, single episode, unspecified: Secondary | ICD-10-CM

## 2018-02-21 ENCOUNTER — Other Ambulatory Visit: Payer: Self-pay | Admitting: Cardiology

## 2018-02-21 ENCOUNTER — Other Ambulatory Visit: Payer: Self-pay | Admitting: Family Medicine

## 2018-02-21 DIAGNOSIS — Z951 Presence of aortocoronary bypass graft: Secondary | ICD-10-CM

## 2018-02-23 ENCOUNTER — Ambulatory Visit: Payer: Medicare HMO | Admitting: Family Medicine

## 2018-02-24 ENCOUNTER — Encounter: Payer: Self-pay | Admitting: Family Medicine

## 2018-03-08 ENCOUNTER — Ambulatory Visit: Payer: Medicare HMO | Admitting: Family Medicine

## 2018-03-10 DIAGNOSIS — R69 Illness, unspecified: Secondary | ICD-10-CM | POA: Diagnosis not present

## 2018-03-11 DIAGNOSIS — R69 Illness, unspecified: Secondary | ICD-10-CM | POA: Diagnosis not present

## 2018-03-12 ENCOUNTER — Encounter: Payer: Self-pay | Admitting: Family Medicine

## 2018-03-12 ENCOUNTER — Ambulatory Visit (INDEPENDENT_AMBULATORY_CARE_PROVIDER_SITE_OTHER): Payer: Medicare HMO | Admitting: Family Medicine

## 2018-03-12 VITALS — BP 144/82 | HR 58 | Temp 97.9°F | Resp 16 | Ht 63.0 in | Wt 144.0 lb

## 2018-03-12 DIAGNOSIS — E1169 Type 2 diabetes mellitus with other specified complication: Secondary | ICD-10-CM | POA: Diagnosis not present

## 2018-03-12 DIAGNOSIS — N939 Abnormal uterine and vaginal bleeding, unspecified: Secondary | ICD-10-CM

## 2018-03-12 DIAGNOSIS — Z23 Encounter for immunization: Secondary | ICD-10-CM

## 2018-03-12 DIAGNOSIS — Z794 Long term (current) use of insulin: Secondary | ICD-10-CM

## 2018-03-12 DIAGNOSIS — R8290 Unspecified abnormal findings in urine: Secondary | ICD-10-CM

## 2018-03-12 LAB — URINALYSIS, ROUTINE W REFLEX MICROSCOPIC
BACTERIA UA: NONE SEEN /HPF
Bilirubin Urine: NEGATIVE
Glucose, UA: NEGATIVE
HGB URINE DIPSTICK: NEGATIVE
Ketones, ur: NEGATIVE
Nitrite: NEGATIVE
PROTEIN: NEGATIVE
RBC / HPF: NONE SEEN /HPF (ref 0–2)
Specific Gravity, Urine: 1.01 (ref 1.001–1.03)
pH: 5.5 (ref 5.0–8.0)

## 2018-03-12 LAB — WET PREP FOR TRICH, YEAST, CLUE

## 2018-03-12 LAB — MICROSCOPIC MESSAGE

## 2018-03-12 MED ORDER — FLUCONAZOLE 150 MG PO TABS: 150.0000 mg | ORAL_TABLET | Freq: Once | ORAL | 0 refills | Status: AC

## 2018-03-12 MED ORDER — CEPHALEXIN 500 MG PO CAPS: 500.0000 mg | ORAL_CAPSULE | Freq: Three times a day (TID) | ORAL | 0 refills | Status: DC

## 2018-03-12 NOTE — Progress Notes (Signed)
Subjective:    Patient ID: Misty Edwards, female    DOB: [birth year], 84 y.o.   MRN: [id number]  HPI  05/11/17 Patient's meter was confirmed to be accurate.  Hemoglobin A1c was well controlled at 6.1 suggesting that her average blood sugar was excellent.  However the patient returns today taking 50 units of Lantus once a day and also requiring 10-15 units of sliding scale insulin depending upon her sugar values with widely varying blood sugars.  Her a.m. blood sugars range between 258 and 300 when she wakes up first thing in the morning fasting.  She then takes 50 units of Lantus.  By lunchtime, her sugars plummet and range between 59 and 96 with the vast majority being below 74.  At suppertime her sugars are between 102 and 196.  However by bedtime her sugars are averaging between 182 and 247.  Patient has not been seen since.   03/12/18 Patient has not been seen since Feb.   She presents today with several concerns.  First she wonders if there is an option that can replace metformin due to diarrhea.  She is frequently experiencing fecal incontinence due to diarrhea that she attributes to metformin.  She has not taken any today.  Her fasting blood sugars have been greater than 200.  However she has not been checking her recording them often.  Second issue is vaginal bleeding.  Prior to Christmas, she was taking a shower.  When she was drying herself off with a towel, she saw blood on the towel from where she had dried her vaginal area.  The following morning, she had painless vaginal bleeding when she went to the bathroom.  She has not seen any vaginal bleeding since.  Third issue is urinary tract infection.  She reports foul odor and occasional dysuria.  This is been going on for several days. Past Medical History:  Diagnosis Date  . Acute biliary pancreatitis 07/2002   thia was in 07/2004:she still has pseudocyst in tail of pancreas measuring 54 x 33 mm   . Adrenal adenoma    bilateral  . Anxiety    . Chronic pancreatitis ([place])   . Detached retina   . Diabetes mellitus ([place])   . Diverticulosis   . History of carcinoma in situ of breast 1988  . Hyperlipidemia   . Hypertension   . IBS (irritable bowel syndrome)   . Legally blind   . Osteoarthritis   . Osteopenia   . Upper GI bleed September2004   secondary to gastritis   Past Surgical History:  Procedure Laterality Date  . CARDIAC CATHETERIZATION N/A 08/24/2014   Procedure: Left Heart Cath and Coronary Angiography;  Surgeon: Leonie Man, MD;  Location: [facility];  Service: Cardiovascular;  Laterality: N/A;  . COLONOSCOPY  08/15/05   few tiny diverticula at sigmoid colon/external hemorrhoids but no polyps  . COLONOSCOPY  01/08/2012   [id number] diverticulosis. Next colonoscopy in 12/2016  . CORONARY ARTERY BYPASS GRAFT N/A 08/24/2014   Procedure: CORONARY ARTERY BYPASS GRAFTING (CABG) x three, using left internal mammary artery and right leg greater saphenous vein harvested endoscopically;  Surgeon: Ivin Poot, MD;  Location: [facility];  Service: Open Heart Surgery;  Laterality: N/A;  . ECTOPIC PREGNANCY SURGERY  1950's  . ERCP with sphincterotomy  07/2002  . ESOPHAGOGASTRODUODENOSCOPY      Gastritis of body.  Otherwise normal  . ESOPHAGOGASTRODUODENOSCOPY  01/08/2012   RMR: Few scattered gastric erosions of uncertain significance-status  post biopsy. Minimal chronic inflammation, no H.pylori  . LAPAROSCOPIC CHOLECYSTECTOMY    . PANCREATIC PSEUDOCYST DRAINAGE  [id number]   drained percutaneously   . right mastectomy    . tacking up of her bladder    . TEE WITHOUT CARDIOVERSION N/A 08/24/2014   Procedure: TRANSESOPHAGEAL ECHOCARDIOGRAM (TEE);  Surgeon: Ivin Poot, MD;  Location: [facility];  Service: Open Heart Surgery;  Laterality: N/A;   Current Outpatient Medications on File Prior to Visit  Medication Sig Dispense Refill  . acetaminophen (TYLENOL) 500 MG tablet Take 500-1,000 mg by mouth 3 (three) times daily as  needed for mild pain. Pain.     Marland Kitchen amLODipine (NORVASC) 5 MG tablet TAKE 1 TABLET BY MOUTH ONCE DAILY 90 tablet 3  . aspirin EC 81 MG EC tablet Take 1 tablet (81 mg total) by mouth daily.    . Blood Glucose Monitoring Suppl (ONE TOUCH ULTRA 2) w/Device KIT Use to check BS up to 5 times daily 1 each 0  . CREON 24000-76000 units CPEP  TAKE 2 CAPSULES BY MOUTH WITH MEALS AND 1 CAP WITH SNACKS 720 capsule 3  . cycloSPORINE (RESTASIS) 0.05 % ophthalmic emulsion Place 1 drop into both eyes 3 (three) times daily.     . diphenhydrAMINE (BENADRYL) 25 mg capsule Take 25 mg by mouth daily as needed for allergies.    Noelle Penner ALLERGY RELIEF, CETIRIZINE, 10 MG tablet TAKE 1 TABLET BY MOUTH ONCE DAILY 90 tablet 2  . ferrous sulfate 325 (65 FE) MG tablet Take 325 mg by mouth daily with breakfast.    . FLUoxetine (PROZAC) 20 MG tablet TAKE 1 TABLET BY MOUTH ONCE DAILY 90 tablet 3  . furosemide (LASIX) 20 MG tablet Take 1 tablet (20 mg total) by mouth every other day. 90 tablet 2  . GLUCERNA (GLUCERNA) LIQD Take 237 mLs by mouth daily as needed (takes if sugar is low).    Marland Kitchen glucose blood (ONE TOUCH ULTRA TEST) test strip USE TO TEST UP TO 5 TIMES DAILY AS DIRECTED. 450 each 0  . insulin NPH-regular Human (NOVOLIN 70/30) (70-30) 100 UNIT/ML injection 25 units q am and 15 units q pm 10 mL 11  . Insulin Syringes, Disposable, U-100 1 ML MISC 1/2 inch 31 g 300 each 3  . Lancet Devices (ONE TOUCH DELICA LANCING DEV) MISC Use to check BS up to 5 times daily 1 each 1  . Lancets MISC Please dispense based on patient and insurance preference. Use as directed to monitor FSBS 3x daily. Dx: E11.65. 300 each 3  . lisinopril (PRINIVIL,ZESTRIL) 2.5 MG tablet Take 1 tablet (2.5 mg total) by mouth daily. 90 tablet 3  . LORazepam (ATIVAN) 0.5 MG tablet TAKE 1 TO 2 TABLETS BY MOUTH AT BEDTIME AS NEEDED FOR INSOMNIA 60 tablet 1  . Magnesium Oxide 400 MG CAPS Take 1 capsule (400 mg total) by mouth daily. 34 capsule 3  . metFORMIN  (GLUCOPHAGE) 1000 MG tablet Take 2 tablets (2,000 mg total) by mouth daily with breakfast. 180 tablet 3  . metoprolol tartrate (LOPRESSOR) 25 MG tablet Take 1 tablet (25 mg total) by mouth 2 (two) times daily. 180 tablet 3  . Multiple Vitamin (MULTIVITAMIN) capsule Take 1 capsule by mouth daily.    Glory Rosebush DELICA LANCETS FINE MISC Check BS 5 x's a day 450 each 3  . Pancrelipase, Lip-Prot-Amyl, (CREON) 24000-76000 units CPEP Take 1-2 capsules by mouth See admin instructions. Takes 2 caps 3 times daily with meals and  1 with snacks    . pantoprazole (PROTONIX) 40 MG tablet Take 1 tablet (40 mg total) by mouth daily. 90 tablet 3  . potassium chloride (K-DUR) 10 MEQ tablet TAKE ONE TABLET BY MOUTH ONCE DAILY AS NEEDED TAKES WITH FUROSEMIDE 90 tablet 2  . promethazine (PHENERGAN) 12.5 MG tablet Take 0.5-1 tablets (6.25-12.5 mg total) by mouth every 6 (six) hours as needed for nausea or vomiting. 30 tablet 0  . RELION INSULIN SYRINGE 1ML/31G 31G X 5/16" 1 ML MISC     . rosuvastatin (CRESTOR) 5 MG tablet TAKE 1 TABLET BY MOUTH ONCE DAILY 90 tablet 3  . Simethicone (GAS-X EXTRA STRENGTH) 125 MG CAPS Take 125 mg by mouth 2 (two) times daily as needed (GAS).    Marland Kitchen triamcinolone cream (KENALOG) 0.1 % APPLY  CREAM EXTERNALLY TWICE DAILY 60 g 1   No current facility-administered medications on file prior to visit.    Allergies  Allergen Reactions  . Calcium-Containing Compounds Nausea Only  . Morphine And Related Other (See Comments)    Hallucination  . Raloxifene Itching    Evista- Face and eyes burning  . Vitamin D Analogs Nausea Only   Social History   Socioeconomic History  . Marital status: Widowed    Spouse name: Not on file  . Number of children: 5  . Years of education: Not on file  . Highest education level: Not on file  Occupational History  . Occupation: homemaker  Social Needs  . Financial resource strain: Not on file  . Food insecurity:    Worry: Not on file    Inability: Not  on file  . Transportation needs:    Medical: Not on file    Non-medical: Not on file  Tobacco Use  . Smoking status: Never Smoker  . Smokeless tobacco: Never Used  Substance and Sexual Activity  . Alcohol use: No    Alcohol/week: 0.0 standard drinks  . Drug use: No  . Sexual activity: Never  Lifestyle  . Physical activity:    Days per week: Not on file    Minutes per session: Not on file  . Stress: Not on file  Relationships  . Social connections:    Talks on phone: Not on file    Gets together: Not on file    Attends religious service: Not on file    Active member of club or organization: Not on file    Attends meetings of clubs or organizations: Not on file    Relationship status: Not on file  . Intimate partner violence:    Fear of current or ex partner: Not on file    Emotionally abused: Not on file    Physically abused: Not on file    Forced sexual activity: Not on file  Other Topics Concern  . Not on file  Social History Narrative   Lives in [place].    nReview of Systems  All other systems reviewed and are negative.      Objective:   Physical Exam  Constitutional: She appears well-developed and well-nourished.  Cardiovascular: Normal rate, regular rhythm and normal heart sounds.  Pulmonary/Chest: Effort normal and breath sounds normal. No respiratory distress. She has no wheezes. She has no rales.  Abdominal: Soft. Bowel sounds are normal. She exhibits no distension. There is no abdominal tenderness. There is no rebound and no guarding. Hernia confirmed negative in the right inguinal area and confirmed negative in the left inguinal area.  Genitourinary:    Uterus  normal.     No labial fusion. There is lesion on the right labia. There is no tenderness or injury on the right labia. There is no tenderness, lesion or injury on the left labia. Cervix exhibits no motion tenderness, no discharge and no friability. Right adnexum displays no mass, no tenderness and  no fullness. Left adnexum displays no mass, no tenderness and no fullness.    Vaginal discharge present.     No vaginal bleeding.  No bleeding in the vagina.  Lymphadenopathy:       Right: No inguinal adenopathy present.       Left: No inguinal adenopathy present.  Vitals reviewed.         Assessment & Plan:   Abnormal urine odor - Plan: Urinalysis, Routine w reflex microscopic, Urinalysis, Routine w reflex microscopic, WET PREP FOR TRICH, YEAST, CLUE  Vaginal bleeding, abnormal - Plan: WET PREP FOR TRICH, YEAST, CLUE  Type 2 diabetes mellitus with other specified complication, with long-term current use of insulin ([place]) - Plan: CBC with Differential/Platelet, COMPLETE METABOLIC PANEL WITH GFR, Hemoglobin A1c  Urinalysis shows trace leukocyte esterase which possibly suggested infection.  Given the foul odor and the dysuria I will treat this with Keflex 500 mg p.o. 3 times daily for 5 days.  I will send a urine culture.  Pelvic exam reveals no specific cause of vaginal bleeding.  Is possible that due to atrophic vaginitis, 1 of the hemangiomas may have been bleeding however I have recommended a gynecology consult for an endometrial biopsy as well as a sonohysterogram to rule out uterine cancer or endometrial cancer as a potential cause.  I will send a wet prep to determine if there may be bacterial vaginosis also contributing to the foul odor.  I will check lab work to evaluate her hemoglobin A1c.  I suspect that she patient may benefit from an insulin sensitizing agent such as Actos in place of the metformin given the fact the cost is going to be an issue and she cannot afford name brand medication.

## 2018-03-12 NOTE — Addendum Note (Signed)
Addended by: Shary Decamp B on: 03/12/2018 04:26 PM   Modules accepted: Orders

## 2018-03-12 NOTE — Addendum Note (Signed)
Addended by: Shary Decamp B on: 03/12/2018 04:52 PM   Modules accepted: Orders

## 2018-03-13 LAB — CBC WITH DIFFERENTIAL/PLATELET
Absolute Monocytes: 494 cells/uL (ref 200–950)
Basophils Absolute: 39 cells/uL (ref 0–200)
Basophils Relative: 0.6 %
EOS PCT: 0.9 %
Eosinophils Absolute: 59 cells/uL (ref 15–500)
HCT: 35.2 % (ref 35.0–45.0)
Hemoglobin: 11.8 g/dL (ref 11.7–15.5)
Lymphs Abs: 2074 cells/uL (ref 850–3900)
MCH: 29.4 pg (ref 27.0–33.0)
MCHC: 33.5 g/dL (ref 32.0–36.0)
MCV: 87.6 fL (ref 80.0–100.0)
MPV: 11.7 fL (ref 7.5–12.5)
Monocytes Relative: 7.6 %
Neutro Abs: 3835 cells/uL (ref 1500–7800)
Neutrophils Relative %: 59 %
PLATELETS: 153 10*3/uL (ref 140–400)
RBC: 4.02 10*6/uL (ref 3.80–5.10)
RDW: 12.7 % (ref 11.0–15.0)
TOTAL LYMPHOCYTE: 31.9 %
WBC: 6.5 10*3/uL (ref 3.8–10.8)

## 2018-03-13 LAB — COMPLETE METABOLIC PANEL WITH GFR
AG RATIO: 1.9 (calc) (ref 1.0–2.5)
ALKALINE PHOSPHATASE (APISO): 32 U/L — AB (ref 33–130)
ALT: 35 U/L — AB (ref 6–29)
AST: 42 U/L — AB (ref 10–35)
Albumin: 4.6 g/dL (ref 3.6–5.1)
BUN/Creatinine Ratio: 18 (calc) (ref 6–22)
BUN: 19 mg/dL (ref 7–25)
CALCIUM: 9.7 mg/dL (ref 8.6–10.4)
CO2: 24 mmol/L (ref 20–32)
Chloride: 98 mmol/L (ref 98–110)
Creat: 1.08 mg/dL — ABNORMAL HIGH (ref 0.60–0.88)
GFR, Est African American: 53 mL/min/{1.73_m2} — ABNORMAL LOW (ref >=60)
GFR, Est Non African American: 45 mL/min/{1.73_m2} — ABNORMAL LOW (ref >=60)
Globulin: 2.4 g/dL (calc) (ref 1.9–3.7)
Glucose, Bld: 104 mg/dL — ABNORMAL HIGH (ref 65–99)
Potassium: 4.2 mmol/L (ref 3.5–5.3)
Sodium: 134 mmol/L — ABNORMAL LOW (ref 135–146)
Total Bilirubin: 0.7 mg/dL (ref 0.2–1.2)
Total Protein: 7 g/dL (ref 6.1–8.1)

## 2018-03-13 LAB — URINE CULTURE
MICRO NUMBER:: 91545006
Result:: NO GROWTH
SPECIMEN QUALITY:: ADEQUATE

## 2018-03-13 LAB — HEMOGLOBIN A1C
EAG (MMOL/L): 6.5 (calc)
Hgb A1c MFr Bld: 5.7 % of total Hgb — ABNORMAL HIGH (ref <5.7)
MEAN PLASMA GLUCOSE: 117 (calc)

## 2018-03-24 ENCOUNTER — Other Ambulatory Visit: Payer: Self-pay | Admitting: Cardiology

## 2018-03-25 ENCOUNTER — Telehealth: Payer: Self-pay | Admitting: *Deleted

## 2018-03-25 NOTE — Telephone Encounter (Signed)
Received call from [name], patient daughter.   Reports that patient continues to have increased FSBS fasting in AM. States that patient frequently has readings >200.  Reports that patient is taking MTF 500mg  PO BID, and if FSBS readings are high in PM, she will add another 250mg  of MTF at night. Also reports that she is taking 70/30 insulin as directed.   Inquired as to if patient should increase nighttime dose of insulin.   MD please advise.

## 2018-03-25 NOTE — Telephone Encounter (Signed)
A1c was 5.7.  I question the accuracy of their meter.  Bring it by so we can check it.  5.7 would be an avergae sugar of 120.

## 2018-03-29 NOTE — Telephone Encounter (Signed)
Tried to call and phone states that the call can not be completed as there are restrictions on the phone

## 2018-04-01 ENCOUNTER — Encounter: Payer: Medicare HMO | Admitting: Adult Health

## 2018-04-05 ENCOUNTER — Other Ambulatory Visit: Payer: Self-pay | Admitting: Family Medicine

## 2018-04-05 DIAGNOSIS — R69 Illness, unspecified: Secondary | ICD-10-CM | POA: Diagnosis not present

## 2018-04-05 NOTE — Telephone Encounter (Signed)
Ok to refill??  Last office visit 03/12/2018.  Last refill 01/26/2018, #1 refill.

## 2018-04-10 ENCOUNTER — Other Ambulatory Visit: Payer: Self-pay | Admitting: Cardiology

## 2018-04-10 ENCOUNTER — Other Ambulatory Visit: Payer: Self-pay | Admitting: Family Medicine

## 2018-04-12 ENCOUNTER — Encounter: Payer: Medicare HMO | Admitting: Adult Health

## 2018-04-12 NOTE — Telephone Encounter (Signed)
Refill Request.  

## 2018-04-16 ENCOUNTER — Telehealth: Payer: Self-pay | Admitting: Family Medicine

## 2018-04-16 NOTE — Telephone Encounter (Signed)
Misty Edwards and states that the Metformin is causing her too much diarrhea and would like to know is she can stop taking it and adjust her insulin instead?

## 2018-04-16 NOTE — Telephone Encounter (Signed)
Stop your metformin, continue her insulin at the current dose.  Report her fasting blood sugars and 2-hour postprandial sugars to me in 1 week

## 2018-04-19 NOTE — Telephone Encounter (Signed)
Debra aware and was informed that if sugars go above 300 to call me and let me know

## 2018-04-22 ENCOUNTER — Encounter: Payer: Medicare HMO | Admitting: Adult Health

## 2018-04-27 ENCOUNTER — Telehealth: Payer: Self-pay | Admitting: Family Medicine

## 2018-04-27 NOTE — Telephone Encounter (Signed)
[phone] Patients daughter calling to say the creon was sent in wrong,  Please call her for clarification

## 2018-04-29 ENCOUNTER — Ambulatory Visit: Payer: Medicare HMO | Admitting: Adult Health

## 2018-04-29 ENCOUNTER — Encounter: Payer: Self-pay | Admitting: Adult Health

## 2018-04-29 VITALS — BP 188/81 | HR 67 | Ht 63.0 in | Wt 145.0 lb

## 2018-04-29 DIAGNOSIS — N950 Postmenopausal bleeding: Secondary | ICD-10-CM

## 2018-04-29 DIAGNOSIS — D239 Other benign neoplasm of skin, unspecified: Secondary | ICD-10-CM | POA: Diagnosis not present

## 2018-04-29 DIAGNOSIS — N952 Postmenopausal atrophic vaginitis: Secondary | ICD-10-CM

## 2018-04-29 NOTE — Progress Notes (Signed)
Patient ID: [name], female   DOB: 01/16/1927, 85 y.o.   MRN: [id number] History of Present Illness:  Misty Edwards is a 85 year old white female, widowed in for complaint of vaginal bleeding in December, noticed after shower, when drying off with towel and then again the next day, noticed blood when peeing, no pain and no bleeding since, has seen Dr Dennard Schaumann, in December, and he wanted her to be seen here, she says.  PCP is Dr Dennard Schaumann.   Current Medications, Allergies, Past Medical History, Past Surgical History, Family History and Social History were reviewed in [employer] record.     Review of Systems: Had vaginal bleeding in December, no pain and none since She has history of breast cancer and MI.    Physical Exam:BP (!) 188/81 (BP Location: Right Arm, Patient Position: Sitting, Cuff Size: Normal)   Pulse 67   Ht 5\' 3"  (1.6 m)   Wt 145 lb (65.8 kg)   BMI 25.69 kg/m  General:  Well developed, well nourished, no acute distress Skin:  Warm and dry Pelvic:  External genitalia is normal in appearance, has several angiokeratomas on labia, more on right then left..  The vagina is pale with very little moisture and loss of rugae. Urethra has no lesions or masses. The cervix is smooth, and atrophic.  Uterus is felt to be normal size, shape, and contour.  No adnexal masses or tenderness noted.Bladder is non tender, no masses felt. Psych:  No mood changes, alert and cooperative,seems happy Discussed with Kelilah  and great grand daughter  that bleeding could have been from wiping too hard, or could be polyp or increased tissue in uterus that caused the bleeding. So will get GYN [place] to assess uterus and will talk when results back.  Fall risk is high. PHQ 2 score 0. Examination chaperoned by Levy Pupa LPN.  Impression: 1. PMB (postmenopausal bleeding)   2. Angiokeratoma   3. Vaginal atrophy       Plan: GYN [place] scheduled for 05/03/2018 at 11:30 am at [facility]   Will talk when results back  F/U prn

## 2018-04-29 NOTE — Telephone Encounter (Signed)
Creon rx resent to [facility] and Debra aware

## 2018-04-30 ENCOUNTER — Encounter: Payer: Self-pay | Admitting: Family Medicine

## 2018-05-03 ENCOUNTER — Ambulatory Visit (HOSPITAL_COMMUNITY)
Admission: RE | Admit: 2018-05-03 | Discharge: 2018-05-03 | Disposition: A | Discharge status: Home / Self Care | Payer: Medicare HMO | Source: Ambulatory Visit | Attending: Adult Health | Admitting: Adult Health

## 2018-05-03 ENCOUNTER — Other Ambulatory Visit: Payer: Self-pay | Admitting: Family Medicine

## 2018-05-03 DIAGNOSIS — N950 Postmenopausal bleeding: Secondary | ICD-10-CM | POA: Diagnosis not present

## 2018-05-03 NOTE — Telephone Encounter (Signed)
Ok to refill?  Medication is no longer listed under current meds.

## 2018-05-04 ENCOUNTER — Telehealth: Payer: Self-pay | Admitting: Adult Health

## 2018-05-04 NOTE — Telephone Encounter (Signed)
[name] is Ms Astarita's daughter and she is aware that [place] was good, no need for biopsy, bleeding probably due to atrophy, can use luvena for vaginal moisture

## 2018-05-18 ENCOUNTER — Other Ambulatory Visit: Payer: Self-pay | Admitting: Family Medicine

## 2018-05-18 ENCOUNTER — Telehealth: Payer: Self-pay | Admitting: Family Medicine

## 2018-05-18 NOTE — Telephone Encounter (Signed)
restasis patient assistance came in, tried calling daughter and son for pick up daughter # is disconnected and son does not have a vm set up.

## 2018-05-18 NOTE — Telephone Encounter (Signed)
Misty Edwards calling regarding her mother medication, she says it is very important that you call her asap  Per daughter  [phone]

## 2018-05-19 MED ORDER — CREON 24000-76000 UNITS PO CPEP: ORAL_CAPSULE | ORAL | 11 refills | Status: DC

## 2018-05-19 NOTE — Telephone Encounter (Signed)
Spoke to [place] and faxed rx to [facility] for Creon

## 2018-05-20 ENCOUNTER — Telehealth: Payer: Self-pay | Admitting: Internal Medicine

## 2018-05-20 NOTE — Telephone Encounter (Signed)
We can provide samples, creon 24000, stock bottle if we have one or one week supply.  PLEASE TOUCH BASE WITH CREON REP IF YOU ARE NOT SURE BUT THEY HAVE A PATIENT ASSISTANCE PROGRAM TO HELP PATIENT'S WITH HIGH COPAYS.   She should return to see RMR only before 08/2018 .

## 2018-05-20 NOTE — Telephone Encounter (Signed)
Noted. Creon 36, 000 unit samples left up front for pt. 1 pill with meals and snack per LSL. Form faxed to the [facility] enrollment center. Spoke with pts daughter, she will pick up samples and inside the sample bag, info on the [place] enrollment program are in there.   Manuela Schwartz please schedule apt with RMR before 05/2018. Pt needs an after 1pm apt.

## 2018-05-20 NOTE — Telephone Encounter (Signed)
LSL, pt was last seen in 09/10/2016. She cancelled her TCS and hasn't followed up. [name] wants to get samples of Creon, which her PCP Jenna Luo has been prescribing since pt hasn't returned to our office. Please advise in reference to pt receiving Creon samples.

## 2018-05-20 NOTE — Telephone Encounter (Signed)
[name], [name], called to see if patient could get Creon Samples because a prescription would cost her $500. Please advise. [phone]

## 2018-05-24 NOTE — Telephone Encounter (Signed)
OV made °

## 2018-05-31 ENCOUNTER — Other Ambulatory Visit: Payer: Self-pay | Admitting: Family Medicine

## 2018-05-31 NOTE — Telephone Encounter (Signed)
[name] brought a form for me to fill out for Creon. Forms were also sent to the [facility] program that I'm waiting to hear back from.

## 2018-06-01 NOTE — Telephone Encounter (Signed)
Requesting refill   Lorazepam   LOV: 03/12/18  LRF:  05/03/18

## 2018-06-03 ENCOUNTER — Other Ambulatory Visit: Payer: Self-pay | Admitting: Family Medicine

## 2018-06-03 MED ORDER — INSULIN NPH ISOPHANE & REGULAR (70-30) 100 UNIT/ML ~~LOC~~ SUSP: SUBCUTANEOUS | 11 refills | Status: DC

## 2018-06-10 NOTE — Telephone Encounter (Signed)
Received letter from [facility] Assist, Creon has been approved through 03/17/19. [name] is aware of approval. Approval letter will be scanned in chart.

## 2018-06-14 ENCOUNTER — Ambulatory Visit: Payer: Medicare HMO | Admitting: Cardiology

## 2018-06-21 ENCOUNTER — Other Ambulatory Visit: Payer: Self-pay | Admitting: Family Medicine

## 2018-06-21 DIAGNOSIS — R69 Illness, unspecified: Secondary | ICD-10-CM | POA: Diagnosis not present

## 2018-06-21 MED ORDER — ONETOUCH DELICA LANCETS 33G MISC: 2 refills | Status: DC

## 2018-06-22 ENCOUNTER — Ambulatory Visit: Payer: Medicare HMO | Admitting: Family Medicine

## 2018-07-06 ENCOUNTER — Other Ambulatory Visit: Payer: Self-pay | Admitting: Family Medicine

## 2018-07-09 ENCOUNTER — Ambulatory Visit: Payer: Medicare HMO | Admitting: Internal Medicine

## 2018-07-10 ENCOUNTER — Other Ambulatory Visit: Payer: Self-pay | Admitting: Family Medicine

## 2018-07-13 ENCOUNTER — Encounter: Payer: Self-pay | Admitting: Internal Medicine

## 2018-07-13 ENCOUNTER — Telehealth: Payer: Self-pay | Admitting: *Deleted

## 2018-07-13 ENCOUNTER — Other Ambulatory Visit: Payer: Self-pay

## 2018-07-13 ENCOUNTER — Ambulatory Visit (INDEPENDENT_AMBULATORY_CARE_PROVIDER_SITE_OTHER): Payer: Medicare HMO | Admitting: Internal Medicine

## 2018-07-13 DIAGNOSIS — R69 Illness, unspecified: Secondary | ICD-10-CM | POA: Diagnosis not present

## 2018-07-13 DIAGNOSIS — K8681 Exocrine pancreatic insufficiency: Secondary | ICD-10-CM

## 2018-07-13 DIAGNOSIS — R195 Other fecal abnormalities: Secondary | ICD-10-CM

## 2018-07-13 MED ORDER — ONETOUCH DELICA LANCETS 33G MISC: 2 refills | Status: DC

## 2018-07-13 MED ORDER — GLUCOSE BLOOD VI STRP: ORAL_STRIP | 3 refills | Status: DC

## 2018-07-13 MED ORDER — ONETOUCH ULTRA 2 W/DEVICE KIT: PACK | 0 refills | Status: DC

## 2018-07-13 NOTE — Telephone Encounter (Signed)
I would recommend TID testing.

## 2018-07-13 NOTE — Telephone Encounter (Signed)
Prescription sent to pharmacy.

## 2018-07-13 NOTE — Progress Notes (Signed)
Referring Provider:  Primary Care Physician:  Susy Frizzle, MD  Primary GI:   Virtual Visit via Telephone Note Due to COVID-19, visit is conducted virtually and was requested by patient.   I connected with [name] on 07/13/18 at  8:00 AM EDT by telephone and verified that I am speaking with the correct person using two identifiers.   I discussed the limitations, risks, security and privacy concerns of performing an evaluation and management service by telephone and the availability of in person appointments. I also discussed with the patient that there may be a patient responsible charge related to this service. The patient expressed understanding and agreed to proceed.  Chief Complaint  Patient presents with  . Gastroesophageal Reflux    f/u Doing okay  . Diarrhea    d/t metformin but can't come off of this, 3-4 times a day, runny, irritated rectum, Has not seen any blood in stool     History of Present Illness: Pleasant 85 year old lady history of pancreatic exocrine deficiency Hemoccult positive stool, anemia previously referred for EGD and colonoscopy.  She got sick with her attempted taking a colonoscopy prep in 2018 blood pressure became urgently elevated.  Procedure was canceled and she ended up spending on one night in the hospital.  Clinically, she has done well she is no longer anemic on iron supplementation.  No melena or hematochezia.  Occult blood positive x2 previously.  Currently denies any odynophagia, dysphagia, early satiety reflux symptoms nausea vomiting diarrhea or constipation.  She and her daughter describe her quality of life is being quite good these days.  She works a little out in the yard tending her flowers.  She continues taking Prilosec and pancreatic enzyme supplements which she can now get a significant discount. She had a negative colonoscopy (diverticulosis) back in 2013.  Prior history of colonic polyps and positive family history of colon  cancer. She and her daughter are not interested in patient having any further invasive evaluation for Hemoccult positive stool given her advanced age and difficulties with the colon prep previously.  Past Medical History:  Diagnosis Date  . Acute biliary pancreatitis 07/2002   thia was in 07/2004:she still has pseudocyst in tail of pancreas measuring 54 x 33 mm   . Adrenal adenoma    bilateral  . Anxiety   . Chronic pancreatitis ([place])   . Detached retina   . Diabetes mellitus ([place])   . Diverticulosis   . History of carcinoma in situ of breast 1988  . Hyperlipidemia   . Hypertension   . IBS (irritable bowel syndrome)   . Legally blind   . Osteoarthritis   . Osteopenia   . Upper GI bleed September2004   secondary to gastritis     Past Surgical History:  Procedure Laterality Date  . CARDIAC CATHETERIZATION N/A 08/24/2014   Procedure: Left Heart Cath and Coronary Angiography;  Surgeon: Leonie Man, MD;  Location: [facility];  Service: Cardiovascular;  Laterality: N/A;  . COLONOSCOPY  08/15/05   few tiny diverticula at sigmoid colon/external hemorrhoids but no polyps  . COLONOSCOPY  01/08/2012   [id number] diverticulosis. Next colonoscopy in 12/2016  . CORONARY ARTERY BYPASS GRAFT N/A 08/24/2014   Procedure: CORONARY ARTERY BYPASS GRAFTING (CABG) x three, using left internal mammary artery and right leg greater saphenous vein harvested endoscopically;  Surgeon: Ivin Poot, MD;  Location: [facility];  Service: Open Heart Surgery;  Laterality: N/A;  . ECTOPIC PREGNANCY SURGERY  1950's  . ERCP with sphincterotomy  07/2002  . ESOPHAGOGASTRODUODENOSCOPY      Gastritis of body.  Otherwise normal  . ESOPHAGOGASTRODUODENOSCOPY  01/08/2012   RMR: Few scattered gastric erosions of uncertain significance-status post biopsy. Minimal chronic inflammation, no H.pylori  . LAPAROSCOPIC CHOLECYSTECTOMY    . PANCREATIC PSEUDOCYST DRAINAGE  [id number]   drained percutaneously   . right  mastectomy    . tacking up of her bladder    . TEE WITHOUT CARDIOVERSION N/A 08/24/2014   Procedure: TRANSESOPHAGEAL ECHOCARDIOGRAM (TEE);  Surgeon: Ivin Poot, MD;  Location: [facility];  Service: Open Heart Surgery;  Laterality: N/A;     Current Meds  Medication Sig  . acetaminophen (TYLENOL) 500 MG tablet Take 500-1,000 mg by mouth 3 (three) times daily as needed for mild pain. Pain.   Marland Kitchen amLODipine (NORVASC) 5 MG tablet TAKE 1 TABLET BY MOUTH ONCE DAILY  . aspirin EC 81 MG EC tablet Take 1 tablet (81 mg total) by mouth daily.  . Blood Glucose Monitoring Suppl (ONE TOUCH ULTRA 2) w/Device KIT Use to check BS up to 5 times daily  . CREON 24000-76000 units CPEP TAKE 2 CAPSULES BY MOUTH WITH MEALS AND 1 CAP WITH SNACKS x 2  . ferrous sulfate 325 (65 FE) MG tablet Take 325 mg by mouth daily with breakfast.  . FLUoxetine (PROZAC) 20 MG tablet TAKE 1 TABLET BY MOUTH ONCE DAILY  . furosemide (LASIX) 20 MG tablet Take 1 tablet by mouth once daily (Patient taking differently: as needed. )  . glucose blood (ONE TOUCH ULTRA TEST) test strip USE 1 STRIP TO CHECK GLUCOSE UP TO FIVE TIMES DAILY AS DIRECTED  . insulin NPH-regular Human (NOVOLIN 70/30) (70-30) 100 UNIT/ML injection 25 units q am and 15 units q pm  . Insulin Syringes, Disposable, U-100 1 ML MISC 1/2 inch 31 g  . Lancet Devices (ONE TOUCH DELICA LANCING DEV) MISC Use to check BS up to 5 times daily  . Lancets MISC Please dispense based on patient and insurance preference. Use as directed to monitor FSBS 3x daily. Dx: E11.65.  Marland Kitchen lisinopril (PRINIVIL,ZESTRIL) 2.5 MG tablet TAKE 1 TABLET BY MOUTH ONCE DAILY  . LORazepam (ATIVAN) 0.5 MG tablet TAKE 1 TO 2 TABLETS BY MOUTH AT BEDTIME AS NEEDED FOR INSOMNIA  . Magnesium Oxide 400 (240 Mg) MG TABS TAKE 1 TABLET BY MOUTH ONCE DAILY  . metFORMIN (GLUCOPHAGE) 1000 MG tablet Take 500 mg by mouth 2 (two) times daily with a meal.  . metoprolol tartrate (LOPRESSOR) 25 MG tablet Take 1 tablet by mouth  twice daily  . Multiple Vitamin (MULTIVITAMIN) capsule Take 1 capsule by mouth daily.  Glory Rosebush Delica Lancets 76P MISC Checks bs 5 x per day  . ONETOUCH DELICA LANCETS FINE MISC Check BS 5 x's a day  . pantoprazole (PROTONIX) 40 MG tablet TAKE 1 TABLET BY MOUTH ONCE DAILY  . potassium chloride (K-DUR) 10 MEQ tablet TAKE ONE TABLET BY MOUTH ONCE DAILY AS NEEDED TAKES WITH FUROSEMIDE  . RELION INSULIN SYR 0.5ML/31G 31G X 5/16" 0.5 ML MISC USE AS DIRECTED  . RELION INSULIN SYRINGE 1ML/31G 31G X 5/16" 1 ML MISC   . rosuvastatin (CRESTOR) 5 MG tablet TAKE 1 TABLET BY MOUTH ONCE DAILY  . Simethicone (GAS-X EXTRA STRENGTH) 125 MG CAPS Take 125 mg by mouth 2 (two) times daily as needed (GAS).  Marland Kitchen triamcinolone cream (KENALOG) 0.1 % APPLY  CREAM EXTERNALLY TWICE DAILY (Patient taking differently: as needed. )  Review of Systems: As in history of present illness  Observations/Objective: No distress. Unable to perform physical exam due to telephone encounter. No video available.   Assessment and Plan:  85 year old lady with multiple comorbidities.  History of Hemoccult positive stool  - no longer anemic on iron.  Clinically, doing very well.  History of pancreatic exocrine insufficiency on pancreatic enzymes also doing very well.  Had a lengthy conversation with the patient and patient's daughter regarding risk and benefits of pursuing Hemoccult positive stool further in this clinical setting. At this time, I do not feel that the benefits are worth the risk of subjecting her to such an invasive procedure giving advanced age, prior difficulties with the procedure and her comorbidities. Understanding that a potential significant underlying diagnosis might be delayed if we forego colonoscopy, I feel this is most reasonable approach.  Patient is not interested in having further invasive testing anyway. She is doing very well on Prilosec and pancreatic enzymes.  CBC was normal.  Follow Up  Instructions:  As discussed with patient and her daughter, will forego offering her colonoscopy at this time.  Of course, should she develop overt bleeding or other new symptoms, plans could change.  Can you omeprazole 20 mg daily indefinitely.  Continue pancreatic enzymes daily indefinitely.  We will plan to see the patient back in the office in December of this year for face-to-face encounter.  Patient is to let us know if she has any interim problems.    I discussed the assessment and treatment plan with the patient. The patient was provided an opportunity to ask questions and all were answered. The patient agreed with the plan and demonstrated an understanding of the instructions.   The patient was advised to call back or seek an in-person evaluation if the symptoms worsen or if the condition fails to improve as anticipated.  I provided 12 minutes of non-face-to-face time during this encounter.  Annitta Needs, PhD, ANP-BC [facility]

## 2018-07-13 NOTE — Telephone Encounter (Signed)
Received fax requesting clarification on DM testing supplies.   Reports that insurance is allowing TID testing, but prescription is for up to 5x daily testing.   MD please advise.

## 2018-07-13 NOTE — Patient Instructions (Signed)
  As discussed with Ms. Wiens and her daughter, will forego offering her colonoscopy at this time.  Of course, should she develop overt bleeding or other new symptoms, plans could change.  Continue omeprazole 20 mg daily indefinitely.  Continue pancreatic enzymes daily indefinitely.  Face-to-face office visit in December of this year..  Patient is to let us know if she has any interim problems.

## 2018-08-02 ENCOUNTER — Other Ambulatory Visit: Payer: Self-pay | Admitting: Cardiology

## 2018-08-16 ENCOUNTER — Telehealth: Payer: Self-pay | Admitting: Family Medicine

## 2018-08-16 NOTE — Telephone Encounter (Signed)
Pt needs refill on test strips and lancets one touch ultra pt test 5 times a day and keeps running out of supplies because her scripts states she only does it 3x a day, to [facility].

## 2018-08-17 ENCOUNTER — Telehealth: Payer: Self-pay | Admitting: Cardiology

## 2018-08-17 MED ORDER — GLUCOSE BLOOD VI STRP: ORAL_STRIP | 3 refills | Status: DC

## 2018-08-17 NOTE — Telephone Encounter (Signed)
Virtual Visit Pre-Appointment Phone Call  "(Name), I am calling you today to discuss your upcoming appointment. We are currently trying to limit exposure to the virus that causes COVID-19 by seeing patients at home rather than in the office."  1. "What is the BEST phone number to call the day of the visit?" - include this in appointment notes  2. Do you have or have access to (through a family member/friend) a smartphone with video capability that we can use for your visit?" a. If yes - list this number in appt notes as cell (if different from BEST phone #) and list the appointment type as a VIDEO visit in appointment notes b. If no - list the appointment type as a PHONE visit in appointment notes  3. Confirm consent - "In the setting of the current Covid19 crisis, you are scheduled for a (phone or video) visit with your provider on (date) at (time).  Just as we do with many in-office visits, in order for you to participate in this visit, we must obtain consent.  If you'd like, I can send this to your mychart (if signed up) or email for you to review.  Otherwise, I can obtain your verbal consent now.  All virtual visits are billed to your insurance company just like a normal visit would be.  By agreeing to a virtual visit, we'd like you to understand that the technology does not allow for your provider to perform an examination, and thus may limit your provider's ability to fully assess your condition. If your provider identifies any concerns that need to be evaluated in person, we will make arrangements to do so.  Finally, though the technology is pretty good, we cannot assure that it will always work on either your or our end, and in the setting of a video visit, we may have to convert it to a phone-only visit.  In either situation, we cannot ensure that we have a secure connection.  Are you willing to proceed?" STAFF: Did the patient verbally acknowledge consent to telehealth visit? Document  YES/NO here: Yes  4. Advise patient to be prepared - "Two hours prior to your appointment, go ahead and check your blood pressure, pulse, oxygen saturation, and your weight (if you have the equipment to check those) and write them all down. When your visit starts, your provider will ask you for this information. If you have an Apple Watch or Kardia device, please plan to have heart rate information ready on the day of your appointment. Please have a pen and paper handy nearby the day of the visit as well."  5. Give patient instructions for MyChart download to smartphone OR [email or web address] as below if video visit (depending on what platform provider is using)  6. Inform patient they will receive a phone call 15 minutes prior to their appointment time (may be from unknown caller ID) so they should be prepared to answer    TELEPHONE CALL NOTE  Misty Edwards has been deemed a candidate for a follow-up tele-health visit to limit community exposure during the Covid-19 pandemic. I spoke with the patient via phone to ensure availability of phone/video source, confirm preferred email & phone number, and discuss instructions and expectations.  I reminded Misty Edwards to be prepared with any vital sign and/or heart rhythm information that could potentially be obtained via home monitoring, at the time of her visit. I reminded Misty Edwards to expect a phone call prior to  her visit.  Misty Edwards 08/17/2018 2:51 PM

## 2018-08-17 NOTE — Telephone Encounter (Signed)
Corrected rx and sent to [facility] - 5 x's per day is 150 and x that by 3 months = 450 pt should not be running out as that is 5 times per day IF that is what the pharmacy is giving her. Increased number to 500 on new rx.

## 2018-08-18 ENCOUNTER — Telehealth: Payer: Self-pay | Admitting: Family Medicine

## 2018-08-18 MED ORDER — GLUCOSE BLOOD VI STRP: ORAL_STRIP | 3 refills | Status: DC

## 2018-08-18 NOTE — Telephone Encounter (Signed)
PA was labeled as N/A as this needs to be filed through her part B not D - note was sent to [facility]

## 2018-08-18 NOTE — Telephone Encounter (Signed)
PA Submitted through [email or web address] and received the following:  [employer] Part D has not yet replied to your PA request. You may close this dialog, return to your dashboard, and perform other tasks.  To check for an update later, open this request again from your dashboard.  If [employer] Part D has not replied to your request within 24 hours please contact [employer] Part D at [phone].  Also sent note to pharm to file under her mcr part b

## 2018-08-19 ENCOUNTER — Telehealth: Payer: Medicare HMO | Admitting: Cardiology

## 2018-08-23 ENCOUNTER — Telehealth: Payer: Self-pay | Admitting: Family Medicine

## 2018-08-23 DIAGNOSIS — R69 Illness, unspecified: Secondary | ICD-10-CM | POA: Diagnosis not present

## 2018-08-23 NOTE — Telephone Encounter (Signed)
Debra calling for [facility] to say that her lancets and strips needs preauthorization  Just fyi

## 2018-08-25 DIAGNOSIS — R69 Illness, unspecified: Secondary | ICD-10-CM | POA: Diagnosis not present

## 2018-08-25 NOTE — Telephone Encounter (Signed)
A prior authorization was done on this and given a NA by insurance as this needs to be filed under part b not d. Note to pharm was sent to file under part b insurance to see if covered. May need to change brand of meter.

## 2018-08-29 ENCOUNTER — Other Ambulatory Visit: Payer: Self-pay | Admitting: Family Medicine

## 2018-08-30 NOTE — Telephone Encounter (Signed)
Ok to refill??  Last office visit 03/12/2018.  Last refill 06/01/2018, #2 refills.

## 2018-08-31 ENCOUNTER — Ambulatory Visit (INDEPENDENT_AMBULATORY_CARE_PROVIDER_SITE_OTHER): Payer: Medicare HMO | Admitting: Family Medicine

## 2018-08-31 ENCOUNTER — Encounter: Payer: Self-pay | Admitting: Family Medicine

## 2018-08-31 ENCOUNTER — Other Ambulatory Visit: Payer: Self-pay

## 2018-08-31 VITALS — BP 160/90 | HR 58 | Temp 97.9°F | Resp 14 | Ht 63.0 in | Wt 142.0 lb

## 2018-08-31 DIAGNOSIS — B028 Zoster with other complications: Secondary | ICD-10-CM | POA: Diagnosis not present

## 2018-08-31 MED ORDER — VALACYCLOVIR HCL 1 G PO TABS: 1000.0000 mg | ORAL_TABLET | Freq: Three times a day (TID) | ORAL | 0 refills | Status: DC

## 2018-08-31 MED ORDER — OXYCODONE HCL 5 MG PO TABS: 5.0000 mg | ORAL_TABLET | ORAL | 0 refills | Status: DC | PRN

## 2018-08-31 NOTE — Progress Notes (Signed)
Subjective:    Patient ID: Misty Edwards, female    DOB: 1926/06/02, 85 y.o.   MRN: [id number]  HPI Patient states that 1 week ago, she developed itching around her left eye.  Last week, she started developing erythema and swelling and bumps around the left eye.  Then she developed blisters on the left side of her forehead and erythema and papules in her left scalp.  Today she clearly has shingles in a dermatomal pattern around the left eye and into the left side of the scalp.  She also has conjunctivitis in the left eye concerning for HSV keratitis.  She has very little vision in the left eye to begin with.  She states that at her baseline, she can only detect light and dark and shapes.  However this is become progressively more blurry over the last week.  She still has good residual vision in her right eye.  She denies any headache or other neurologic deficits or confusion Past Medical History:  Diagnosis Date  . Acute biliary pancreatitis 07/2002   thia was in 07/2004:she still has pseudocyst in tail of pancreas measuring 54 x 33 mm   . Adrenal adenoma    bilateral  . Anxiety   . Chronic pancreatitis ([place])   . Detached retina   . Diabetes mellitus ([place])   . Diverticulosis   . History of carcinoma in situ of breast 1988  . Hyperlipidemia   . Hypertension   . IBS (irritable bowel syndrome)   . Legally blind   . Osteoarthritis   . Osteopenia   . Upper GI bleed September2004   secondary to gastritis   Past Surgical History:  Procedure Laterality Date  . CARDIAC CATHETERIZATION N/A 08/24/2014   Procedure: Left Heart Cath and Coronary Angiography;  Surgeon: Leonie Man, MD;  Location: [facility];  Service: Cardiovascular;  Laterality: N/A;  . COLONOSCOPY  08/15/05   few tiny diverticula at sigmoid colon/external hemorrhoids but no polyps  . COLONOSCOPY  01/08/2012   [id number] diverticulosis. Next colonoscopy in 12/2016  . CORONARY ARTERY BYPASS GRAFT N/A 08/24/2014   Procedure: CORONARY ARTERY BYPASS GRAFTING (CABG) x three, using left internal mammary artery and right leg greater saphenous vein harvested endoscopically;  Surgeon: Ivin Poot, MD;  Location: [facility];  Service: Open Heart Surgery;  Laterality: N/A;  . ECTOPIC PREGNANCY SURGERY  1950's  . ERCP with sphincterotomy  07/2002  . ESOPHAGOGASTRODUODENOSCOPY      Gastritis of body.  Otherwise normal  . ESOPHAGOGASTRODUODENOSCOPY  01/08/2012   RMR: Few scattered gastric erosions of uncertain significance-status post biopsy. Minimal chronic inflammation, no H.pylori  . LAPAROSCOPIC CHOLECYSTECTOMY    . PANCREATIC PSEUDOCYST DRAINAGE  [id number]   drained percutaneously   . right mastectomy    . tacking up of her bladder    . TEE WITHOUT CARDIOVERSION N/A 08/24/2014   Procedure: TRANSESOPHAGEAL ECHOCARDIOGRAM (TEE);  Surgeon: Ivin Poot, MD;  Location: [facility];  Service: Open Heart Surgery;  Laterality: N/A;   Current Outpatient Medications on File Prior to Visit  Medication Sig Dispense Refill  . acetaminophen (TYLENOL) 500 MG tablet Take 500-1,000 mg by mouth 3 (three) times daily as needed for mild pain. Pain.     Marland Kitchen amLODipine (NORVASC) 5 MG tablet TAKE 1 TABLET BY MOUTH ONCE DAILY 90 tablet 0  . aspirin EC 81 MG EC tablet Take 1 tablet (81 mg total) by mouth daily.    . Blood Glucose Monitoring Suppl (  ONE TOUCH ULTRA 2) w/Device KIT Use as directed to monitor FSBS 3x daily. Dx: E11.9. 1 each 0  . CREON 24000-76000 units CPEP TAKE 2 CAPSULES BY MOUTH WITH MEALS AND 1 CAP WITH SNACKS x 2 300 capsule 11  . cycloSPORINE (RESTASIS) 0.05 % ophthalmic emulsion Place 1 drop into both eyes 3 (three) times daily.     . ferrous sulfate 325 (65 FE) MG tablet Take 325 mg by mouth daily with breakfast.    . FLUoxetine (PROZAC) 20 MG tablet TAKE 1 TABLET BY MOUTH ONCE DAILY 90 tablet 3  . furosemide (LASIX) 20 MG tablet Take 1 tablet by mouth once daily (Patient taking differently: as needed. ) 90 tablet 3  .  glucose blood (ONE TOUCH ULTRA TEST) test strip Use as directed to monitor FSBS 5 x daily. Dx: E11.9. 450 each 3  . insulin NPH-regular Human (NOVOLIN 70/30) (70-30) 100 UNIT/ML injection 25 units q am and 15 units q pm 10 mL 11  . Insulin Syringes, Disposable, U-100 1 ML MISC 1/2 inch 31 g 300 each 3  . lisinopril (ZESTRIL) 2.5 MG tablet Take 1 tablet by mouth once daily 90 tablet 0  . LORazepam (ATIVAN) 0.5 MG tablet TAKE 1 OR 2 TABLETS BY MOUTH AT BEDTIME AS NEEDED FOR  INSOMNIA 60 tablet 0  . Magnesium Oxide 400 (240 Mg) MG TABS TAKE 1 TABLET BY MOUTH ONCE DAILY 90 tablet 3  . metFORMIN (GLUCOPHAGE) 1000 MG tablet Take 500 mg by mouth 2 (two) times daily with a meal.    . metoprolol tartrate (LOPRESSOR) 25 MG tablet Take 1 tablet by mouth twice daily 180 tablet 3  . Multiple Vitamin (MULTIVITAMIN) capsule Take 1 capsule by mouth daily.    Glory Rosebush Delica Lancets 56E MISC Use as directed to monitor FSBS 3x daily. Dx: E11.9. 300 each 2  . pantoprazole (PROTONIX) 40 MG tablet Take 1 tablet by mouth once daily 90 tablet 0  . potassium chloride (K-DUR) 10 MEQ tablet TAKE ONE TABLET BY MOUTH ONCE DAILY AS NEEDED TAKES WITH FUROSEMIDE 90 tablet 2  . RELION INSULIN SYR 0.5ML/31G 31G X 5/16" 0.5 ML MISC USE AS DIRECTED 300 each 0  . RELION INSULIN SYRINGE 1ML/31G 31G X 5/16" 1 ML MISC     . rosuvastatin (CRESTOR) 5 MG tablet TAKE 1 TABLET BY MOUTH ONCE DAILY 90 tablet 3  . Simethicone (GAS-X EXTRA STRENGTH) 125 MG CAPS Take 125 mg by mouth 2 (two) times daily as needed (GAS).    Marland Kitchen triamcinolone cream (KENALOG) 0.1 % APPLY  CREAM EXTERNALLY TWICE DAILY (Patient taking differently: as needed. ) 60 g 1   No current facility-administered medications on file prior to visit.    Allergies  Allergen Reactions  . Calcium-Containing Compounds Nausea Only  . Morphine And Related Other (See Comments)    Hallucination  . Raloxifene Itching    Evista- Face and eyes burning  . Vitamin D Analogs Nausea Only    Social History   Socioeconomic History  . Marital status: Widowed    Spouse name: Not on file  . Number of children: 5  . Years of education: Not on file  . Highest education level: Not on file  Occupational History  . Occupation: homemaker  Social Needs  . Financial resource strain: Not on file  . Food insecurity    Worry: Not on file    Inability: Not on file  . Transportation needs    Medical: Not on file  Non-medical: Not on file  Tobacco Use  . Smoking status: Never Smoker  . Smokeless tobacco: Never Used  Substance and Sexual Activity  . Alcohol use: No    Alcohol/week: 0.0 standard drinks  . Drug use: No  . Sexual activity: Not Currently    Birth control/protection: Post-menopausal  Lifestyle  . Physical activity    Days per week: Not on file    Minutes per session: Not on file  . Stress: Not on file  Relationships  . Social Herbalist on phone: Not on file    Gets together: Not on file    Attends religious service: Not on file    Active member of club or organization: Not on file    Attends meetings of clubs or organizations: Not on file    Relationship status: Not on file  . Intimate partner violence    Fear of current or ex partner: Not on file    Emotionally abused: Not on file    Physically abused: Not on file    Forced sexual activity: Not on file  Other Topics Concern  . Not on file  Social History Narrative   Lives in [place].      Review of Systems  All other systems reviewed and are negative.      Objective:   Physical Exam Vitals signs reviewed.  Constitutional:      General: She is not in acute distress.    Appearance: She is not ill-appearing or toxic-appearing.  HENT:     Head:   Eyes:     Conjunctiva/sclera:     Left eye: Left conjunctiva is injected.  Cardiovascular:     Rate and Rhythm: Normal rate and regular rhythm.  Pulmonary:     Effort: Pulmonary effort is normal.     Breath sounds: Normal breath  sounds.  Neurological:     Mental Status: She is alert.           Assessment & Plan:  1. Herpes zoster with complication - Ambulatory referral to Ophthalmology  Patient has shingles around the left eye and appears to have conjunctival involvement grossly.  Have recommended ophthalmology consultation however given her baseline visual deficit in the left eye I am not sure that there is much more they can do to or that needs to be done.  Instead I will start the patient on Valtrex 1 g p.o. 3 times daily for 7 days and add oxycodone 5 mg every 6 hours as needed for pain as she reports a fair amount of burning, [place], severe pain in the area involved with a rash.

## 2018-09-02 DIAGNOSIS — H40052 Ocular hypertension, left eye: Secondary | ICD-10-CM | POA: Diagnosis not present

## 2018-09-02 DIAGNOSIS — E119 Type 2 diabetes mellitus without complications: Secondary | ICD-10-CM | POA: Diagnosis not present

## 2018-09-02 DIAGNOSIS — B0239 Other herpes zoster eye disease: Secondary | ICD-10-CM | POA: Diagnosis not present

## 2018-09-03 ENCOUNTER — Inpatient Hospital Stay (HOSPITAL_COMMUNITY)
Admission: EM | Admit: 2018-09-03 | Discharge: 2018-09-06 | DRG: 535 | Disposition: A | Discharge status: Home Health | Payer: Medicare HMO | Attending: Internal Medicine | Admitting: Internal Medicine

## 2018-09-03 ENCOUNTER — Other Ambulatory Visit: Payer: Self-pay

## 2018-09-03 ENCOUNTER — Emergency Department (HOSPITAL_COMMUNITY): Payer: Medicare HMO

## 2018-09-03 ENCOUNTER — Encounter (HOSPITAL_COMMUNITY): Payer: Self-pay

## 2018-09-03 DIAGNOSIS — K8681 Exocrine pancreatic insufficiency: Secondary | ICD-10-CM | POA: Diagnosis present

## 2018-09-03 DIAGNOSIS — H409 Unspecified glaucoma: Secondary | ICD-10-CM | POA: Diagnosis present

## 2018-09-03 DIAGNOSIS — R55 Syncope and collapse: Secondary | ICD-10-CM | POA: Diagnosis present

## 2018-09-03 DIAGNOSIS — K5900 Constipation, unspecified: Secondary | ICD-10-CM | POA: Diagnosis present

## 2018-09-03 DIAGNOSIS — W010XXA Fall on same level from slipping, tripping and stumbling without subsequent striking against object, initial encounter: Secondary | ICD-10-CM | POA: Diagnosis not present

## 2018-09-03 DIAGNOSIS — S199XXA Unspecified injury of neck, initial encounter: Secondary | ICD-10-CM | POA: Diagnosis not present

## 2018-09-03 DIAGNOSIS — I2510 Atherosclerotic heart disease of native coronary artery without angina pectoris: Secondary | ICD-10-CM | POA: Diagnosis present

## 2018-09-03 DIAGNOSIS — K589 Irritable bowel syndrome without diarrhea: Secondary | ICD-10-CM | POA: Diagnosis present

## 2018-09-03 DIAGNOSIS — S0990XA Unspecified injury of head, initial encounter: Secondary | ICD-10-CM | POA: Diagnosis not present

## 2018-09-03 DIAGNOSIS — Z888 Allergy status to other drugs, medicaments and biological substances status: Secondary | ICD-10-CM

## 2018-09-03 DIAGNOSIS — E860 Dehydration: Secondary | ICD-10-CM | POA: Diagnosis present

## 2018-09-03 DIAGNOSIS — N179 Acute kidney failure, unspecified: Secondary | ICD-10-CM | POA: Diagnosis not present

## 2018-09-03 DIAGNOSIS — B0239 Other herpes zoster eye disease: Secondary | ICD-10-CM | POA: Diagnosis not present

## 2018-09-03 DIAGNOSIS — W19XXXA Unspecified fall, initial encounter: Secondary | ICD-10-CM

## 2018-09-03 DIAGNOSIS — H548 Legal blindness, as defined in USA: Secondary | ICD-10-CM | POA: Diagnosis present

## 2018-09-03 DIAGNOSIS — I340 Nonrheumatic mitral (valve) insufficiency: Secondary | ICD-10-CM | POA: Diagnosis not present

## 2018-09-03 DIAGNOSIS — Z20828 Contact with and (suspected) exposure to other viral communicable diseases: Secondary | ICD-10-CM | POA: Diagnosis not present

## 2018-09-03 DIAGNOSIS — I252 Old myocardial infarction: Secondary | ICD-10-CM

## 2018-09-03 DIAGNOSIS — E1165 Type 2 diabetes mellitus with hyperglycemia: Secondary | ICD-10-CM | POA: Diagnosis present

## 2018-09-03 DIAGNOSIS — D696 Thrombocytopenia, unspecified: Secondary | ICD-10-CM | POA: Diagnosis present

## 2018-09-03 DIAGNOSIS — R1030 Lower abdominal pain, unspecified: Secondary | ICD-10-CM | POA: Diagnosis not present

## 2018-09-03 DIAGNOSIS — Y92003 Bedroom of unspecified non-institutional (private) residence as the place of occurrence of the external cause: Secondary | ICD-10-CM | POA: Diagnosis not present

## 2018-09-03 DIAGNOSIS — F419 Anxiety disorder, unspecified: Secondary | ICD-10-CM | POA: Diagnosis present

## 2018-09-03 DIAGNOSIS — K861 Other chronic pancreatitis: Secondary | ICD-10-CM | POA: Diagnosis not present

## 2018-09-03 DIAGNOSIS — S32434A Nondisplaced fracture of anterior column [iliopubic] of right acetabulum, initial encounter for closed fracture: Secondary | ICD-10-CM | POA: Diagnosis not present

## 2018-09-03 DIAGNOSIS — Z800 Family history of malignant neoplasm of digestive organs: Secondary | ICD-10-CM

## 2018-09-03 DIAGNOSIS — S32401A Unspecified fracture of right acetabulum, initial encounter for closed fracture: Secondary | ICD-10-CM | POA: Diagnosis present

## 2018-09-03 DIAGNOSIS — I10 Essential (primary) hypertension: Secondary | ICD-10-CM | POA: Diagnosis present

## 2018-09-03 DIAGNOSIS — K219 Gastro-esophageal reflux disease without esophagitis: Secondary | ICD-10-CM | POA: Diagnosis present

## 2018-09-03 DIAGNOSIS — I361 Nonrheumatic tricuspid (valve) insufficiency: Secondary | ICD-10-CM | POA: Diagnosis not present

## 2018-09-03 DIAGNOSIS — Z1159 Encounter for screening for other viral diseases: Secondary | ICD-10-CM

## 2018-09-03 DIAGNOSIS — S32414A Nondisplaced fracture of anterior wall of right acetabulum, initial encounter for closed fracture: Principal | ICD-10-CM | POA: Diagnosis present

## 2018-09-03 DIAGNOSIS — B0230 Zoster ocular disease, unspecified: Secondary | ICD-10-CM | POA: Diagnosis present

## 2018-09-03 DIAGNOSIS — E785 Hyperlipidemia, unspecified: Secondary | ICD-10-CM | POA: Diagnosis present

## 2018-09-03 DIAGNOSIS — S32411A Displaced fracture of anterior wall of right acetabulum, initial encounter for closed fracture: Secondary | ICD-10-CM | POA: Diagnosis not present

## 2018-09-03 DIAGNOSIS — Z951 Presence of aortocoronary bypass graft: Secondary | ICD-10-CM

## 2018-09-03 DIAGNOSIS — Z86010 Personal history of colonic polyps: Secondary | ICD-10-CM

## 2018-09-03 DIAGNOSIS — Z6824 Body mass index (BMI) 24.0-24.9, adult: Secondary | ICD-10-CM

## 2018-09-03 DIAGNOSIS — Z9011 Acquired absence of right breast and nipple: Secondary | ICD-10-CM

## 2018-09-03 DIAGNOSIS — Z79891 Long term (current) use of opiate analgesic: Secondary | ICD-10-CM

## 2018-09-03 DIAGNOSIS — R69 Illness, unspecified: Secondary | ICD-10-CM | POA: Diagnosis not present

## 2018-09-03 DIAGNOSIS — Z86000 Personal history of in-situ neoplasm of breast: Secondary | ICD-10-CM

## 2018-09-03 DIAGNOSIS — R52 Pain, unspecified: Secondary | ICD-10-CM | POA: Diagnosis not present

## 2018-09-03 DIAGNOSIS — M8580 Other specified disorders of bone density and structure, unspecified site: Secondary | ICD-10-CM | POA: Diagnosis present

## 2018-09-03 DIAGNOSIS — E871 Hypo-osmolality and hyponatremia: Secondary | ICD-10-CM | POA: Diagnosis present

## 2018-09-03 DIAGNOSIS — Z7982 Long term (current) use of aspirin: Secondary | ICD-10-CM

## 2018-09-03 DIAGNOSIS — Z885 Allergy status to narcotic agent status: Secondary | ICD-10-CM

## 2018-09-03 DIAGNOSIS — E1159 Type 2 diabetes mellitus with other circulatory complications: Secondary | ICD-10-CM | POA: Diagnosis present

## 2018-09-03 DIAGNOSIS — E43 Unspecified severe protein-calorie malnutrition: Secondary | ICD-10-CM | POA: Diagnosis not present

## 2018-09-03 DIAGNOSIS — S300XXA Contusion of lower back and pelvis, initial encounter: Secondary | ICD-10-CM | POA: Diagnosis not present

## 2018-09-03 DIAGNOSIS — Z794 Long term (current) use of insulin: Secondary | ICD-10-CM

## 2018-09-03 DIAGNOSIS — Z79899 Other long term (current) drug therapy: Secondary | ICD-10-CM

## 2018-09-03 LAB — CBC WITH DIFFERENTIAL/PLATELET
Abs Immature Granulocytes: 0.03 10*3/uL (ref 0.00–0.07)
Basophils Absolute: 0 10*3/uL (ref 0.0–0.1)
Basophils Relative: 0 %
Eosinophils Absolute: 0 10*3/uL (ref 0.0–0.5)
Eosinophils Relative: 0 %
HCT: 33.2 % — ABNORMAL LOW (ref 36.0–46.0)
Hemoglobin: 11.3 g/dL — ABNORMAL LOW (ref 12.0–15.0)
Immature Granulocytes: 0 %
Lymphocytes Relative: 20 %
Lymphs Abs: 1.4 10*3/uL (ref 0.7–4.0)
MCH: 29.3 pg (ref 26.0–34.0)
MCHC: 34 g/dL (ref 30.0–36.0)
MCV: 86 fL (ref 80.0–100.0)
Monocytes Absolute: 0.8 10*3/uL (ref 0.1–1.0)
Monocytes Relative: 12 %
Neutro Abs: 4.6 10*3/uL (ref 1.7–7.7)
Neutrophils Relative %: 68 %
Platelets: 119 10*3/uL — ABNORMAL LOW (ref 150–400)
RBC: 3.86 MIL/uL — ABNORMAL LOW (ref 3.87–5.11)
RDW: 13.1 % (ref 11.5–15.5)
WBC: 6.8 10*3/uL (ref 4.0–10.5)
nRBC: 0 % (ref 0.0–0.2)

## 2018-09-03 LAB — GLUCOSE, CAPILLARY
Glucose-Capillary: 263 mg/dL — ABNORMAL HIGH (ref 70–99)
Glucose-Capillary: 348 mg/dL — ABNORMAL HIGH (ref 70–99)
Glucose-Capillary: 288 mg/dL — ABNORMAL HIGH (ref 70–99)

## 2018-09-03 LAB — BASIC METABOLIC PANEL
Anion gap: 14 (ref 5–15)
BUN: 26 mg/dL — ABNORMAL HIGH (ref 8–23)
CO2: 25 mmol/L (ref 22–32)
Calcium: 9.2 mg/dL (ref 8.9–10.3)
Chloride: 93 mmol/L — ABNORMAL LOW (ref 98–111)
Creatinine, Ser: 1.5 mg/dL — ABNORMAL HIGH (ref 0.44–1.00)
GFR calc Af Amer: 35 mL/min — ABNORMAL LOW (ref >60)
GFR calc non Af Amer: 31 mL/min — ABNORMAL LOW (ref >60)
Glucose, Bld: 172 mg/dL — ABNORMAL HIGH (ref 70–99)
Potassium: 4.9 mmol/L (ref 3.5–5.1)
Sodium: 132 mmol/L — ABNORMAL LOW (ref 135–145)

## 2018-09-03 LAB — SARS CORONAVIRUS 2 BY RT PCR (HOSPITAL ORDER, PERFORMED IN ~~LOC~~ HOSPITAL LAB): SARS Coronavirus 2: NEGATIVE

## 2018-09-03 MED ORDER — ENSURE ENLIVE PO LIQD
237.0000 mL | Freq: Two times a day (BID) | ORAL | Status: DC
  Administered 2018-09-04: 237 mL via ORAL

## 2018-09-03 MED ORDER — INSULIN ASPART 100 UNIT/ML ~~LOC~~ SOLN
0.0000 [IU] | Freq: Three times a day (TID) | SUBCUTANEOUS | Status: DC
  Administered 2018-09-03: 5 [IU] via SUBCUTANEOUS
  Administered 2018-09-03: 7 [IU] via SUBCUTANEOUS
  Administered 2018-09-04: 1 [IU] via SUBCUTANEOUS
  Administered 2018-09-04: 5 [IU] via SUBCUTANEOUS
  Administered 2018-09-04: 2 [IU] via SUBCUTANEOUS
  Administered 2018-09-05: 1 [IU] via SUBCUTANEOUS

## 2018-09-03 MED ORDER — INSULIN ASPART 100 UNIT/ML ~~LOC~~ SOLN
0.0000 [IU] | Freq: Every day | SUBCUTANEOUS | Status: DC
  Administered 2018-09-03 – 2018-09-04 (×2): 3 [IU] via SUBCUTANEOUS

## 2018-09-03 MED ORDER — ONDANSETRON HCL 4 MG/2ML IJ SOLN: 4.0000 mg | Freq: Four times a day (QID) | INTRAMUSCULAR | Status: DC | PRN

## 2018-09-03 MED ORDER — ACETAMINOPHEN 325 MG PO TABS
650.0000 mg | ORAL_TABLET | Freq: Four times a day (QID) | ORAL | Status: DC | PRN
  Administered 2018-09-04 – 2018-09-05 (×2): 650 mg via ORAL
  Filled 2018-09-03 (×2): qty 2

## 2018-09-03 MED ORDER — FENTANYL CITRATE (PF) 100 MCG/2ML IJ SOLN: 12.5000 ug | INTRAMUSCULAR | Status: DC | PRN

## 2018-09-03 MED ORDER — METOPROLOL TARTRATE 25 MG PO TABS
25.0000 mg | ORAL_TABLET | Freq: Two times a day (BID) | ORAL | Status: DC
  Administered 2018-09-03 – 2018-09-06 (×7): 25 mg via ORAL
  Filled 2018-09-03 (×7): qty 1

## 2018-09-03 MED ORDER — CYCLOSPORINE 0.05 % OP EMUL
1.0000 [drp] | Freq: Two times a day (BID) | OPHTHALMIC | Status: DC
  Administered 2018-09-03 – 2018-09-06 (×5): 1 [drp] via OPHTHALMIC
  Filled 2018-09-03 (×4): qty 30

## 2018-09-03 MED ORDER — ENSURE SURGERY PO LIQD
237.0000 mL | Freq: Two times a day (BID) | ORAL | Status: DC
  Filled 2018-09-03 (×7): qty 237

## 2018-09-03 MED ORDER — AMLODIPINE BESYLATE 5 MG PO TABS
5.0000 mg | ORAL_TABLET | Freq: Every day | ORAL | Status: DC
  Administered 2018-09-03 – 2018-09-06 (×4): 5 mg via ORAL
  Filled 2018-09-03 (×5): qty 1

## 2018-09-03 MED ORDER — SODIUM CHLORIDE 0.9% FLUSH
3.0000 mL | Freq: Two times a day (BID) | INTRAVENOUS | Status: DC
  Administered 2018-09-04 – 2018-09-06 (×5): 3 mL via INTRAVENOUS

## 2018-09-03 MED ORDER — DOCUSATE SODIUM 100 MG PO CAPS
100.0000 mg | ORAL_CAPSULE | Freq: Two times a day (BID) | ORAL | Status: DC
  Administered 2018-09-03 – 2018-09-05 (×5): 100 mg via ORAL
  Filled 2018-09-03 (×5): qty 1

## 2018-09-03 MED ORDER — BRIMONIDINE TARTRATE 0.2 % OP SOLN
1.0000 [drp] | Freq: Two times a day (BID) | OPHTHALMIC | Status: DC
  Administered 2018-09-03 – 2018-09-06 (×7): 1 [drp] via OPHTHALMIC
  Filled 2018-09-03: qty 5

## 2018-09-03 MED ORDER — TIMOLOL MALEATE 0.5 % OP SOLN
1.0000 [drp] | Freq: Two times a day (BID) | OPHTHALMIC | Status: DC
  Administered 2018-09-03 – 2018-09-06 (×7): 1 [drp] via OPHTHALMIC
  Filled 2018-09-03: qty 5

## 2018-09-03 MED ORDER — ASPIRIN EC 81 MG PO TBEC
81.0000 mg | DELAYED_RELEASE_TABLET | Freq: Every day | ORAL | Status: DC
  Administered 2018-09-03 – 2018-09-06 (×4): 81 mg via ORAL
  Filled 2018-09-03 (×8): qty 1

## 2018-09-03 MED ORDER — PANTOPRAZOLE SODIUM 40 MG PO TBEC
40.0000 mg | DELAYED_RELEASE_TABLET | Freq: Every day | ORAL | Status: DC
  Administered 2018-09-03 – 2018-09-06 (×4): 40 mg via ORAL
  Filled 2018-09-03 (×4): qty 1

## 2018-09-03 MED ORDER — FERROUS SULFATE 325 (65 FE) MG PO TABS
325.0000 mg | ORAL_TABLET | Freq: Every day | ORAL | Status: DC
  Administered 2018-09-03 – 2018-09-06 (×4): 325 mg via ORAL
  Filled 2018-09-03 (×4): qty 1

## 2018-09-03 MED ORDER — MORPHINE SULFATE (PF) 2 MG/ML IV SOLN: 2.0000 mg | INTRAVENOUS | Status: DC | PRN

## 2018-09-03 MED ORDER — BRIMONIDINE TARTRATE-TIMOLOL 0.2-0.5 % OP SOLN: 1.0000 [drp] | Freq: Two times a day (BID) | OPHTHALMIC | Status: DC

## 2018-09-03 MED ORDER — ACETAMINOPHEN 650 MG RE SUPP: 650.0000 mg | Freq: Four times a day (QID) | RECTAL | Status: DC | PRN

## 2018-09-03 MED ORDER — LACTATED RINGERS IV SOLN
INTRAVENOUS | Status: DC
  Administered 2018-09-03 – 2018-09-04 (×3): via INTRAVENOUS

## 2018-09-03 MED ORDER — VALACYCLOVIR HCL 500 MG PO TABS
1000.0000 mg | ORAL_TABLET | Freq: Once | ORAL | Status: AC
  Administered 2018-09-03: 1000 mg via ORAL
  Filled 2018-09-03: qty 2

## 2018-09-03 MED ORDER — OXYCODONE HCL 5 MG PO TABS
5.0000 mg | ORAL_TABLET | ORAL | Status: DC | PRN
  Administered 2018-09-03 – 2018-09-04 (×4): 5 mg via ORAL
  Filled 2018-09-03 (×5): qty 1

## 2018-09-03 MED ORDER — HEPARIN SODIUM (PORCINE) 5000 UNIT/ML IJ SOLN
5000.0000 [IU] | Freq: Three times a day (TID) | INTRAMUSCULAR | Status: DC
  Administered 2018-09-03 – 2018-09-06 (×9): 5000 [IU] via SUBCUTANEOUS
  Filled 2018-09-03 (×9): qty 1

## 2018-09-03 MED ORDER — VALACYCLOVIR HCL 500 MG PO TABS
1000.0000 mg | ORAL_TABLET | Freq: Every day | ORAL | Status: DC
  Administered 2018-09-04 – 2018-09-05 (×2): 1000 mg via ORAL
  Filled 2018-09-03 (×2): qty 2

## 2018-09-03 MED ORDER — POLYETHYLENE GLYCOL 3350 17 G PO PACK
17.0000 g | PACK | Freq: Every day | ORAL | Status: DC
  Administered 2018-09-04 – 2018-09-06 (×3): 17 g via ORAL
  Filled 2018-09-03 (×4): qty 1

## 2018-09-03 MED ORDER — INSULIN NPH (HUMAN) (ISOPHANE) 100 UNIT/ML ~~LOC~~ SUSP
15.0000 [IU] | Freq: Two times a day (BID) | SUBCUTANEOUS | Status: DC
  Administered 2018-09-03 – 2018-09-05 (×4): 15 [IU] via SUBCUTANEOUS
  Filled 2018-09-03: qty 10

## 2018-09-03 MED ORDER — PANCRELIPASE (LIP-PROT-AMYL) 12000-38000 UNITS PO CPEP
24000.0000 [IU] | ORAL_CAPSULE | Freq: Three times a day (TID) | ORAL | Status: DC
  Administered 2018-09-03 – 2018-09-06 (×10): 24000 [IU] via ORAL
  Filled 2018-09-03 (×10): qty 2

## 2018-09-03 MED ORDER — FLUOXETINE HCL 20 MG PO CAPS
20.0000 mg | ORAL_CAPSULE | Freq: Every day | ORAL | Status: DC
  Administered 2018-09-03 – 2018-09-06 (×4): 20 mg via ORAL
  Filled 2018-09-03 (×4): qty 1

## 2018-09-03 MED ORDER — LORAZEPAM 0.5 MG PO TABS: 0.5000 mg | ORAL_TABLET | Freq: Every evening | ORAL | Status: DC | PRN

## 2018-09-03 MED ORDER — VALACYCLOVIR HCL 500 MG PO TABS: 1000.0000 mg | ORAL_TABLET | Freq: Three times a day (TID) | ORAL | Status: DC

## 2018-09-03 MED ORDER — ONDANSETRON HCL 4 MG PO TABS: 4.0000 mg | ORAL_TABLET | Freq: Four times a day (QID) | ORAL | Status: DC | PRN

## 2018-09-03 NOTE — ED Notes (Signed)
ED TO INPATIENT HANDOFF REPORT  ED Nurse Name and Phone #: Gaspar Bidding [phone]  S Name/Age/Gender Misty Edwards 85 y.o. female Room/Bed: APA14/APA14  Code Status   Code Status: Prior  Home/SNF/Other Home Patient oriented to: self, place, time and situation Is this baseline? Yes   Triage Complete: Triage complete  Chief Complaint fall  Triage Note Pt slipped getting up to go to the bathroom, states she fell onto her right side.  Pt unable to lift her right leg due to pain in her groin area.  Pt denies other complaints.    Allergies Allergies  Allergen Reactions  . Calcium-Containing Compounds Nausea Only  . Morphine And Related Other (See Comments)    Hallucination  . Raloxifene Itching    Evista- Face and eyes burning  . Vitamin D Analogs Nausea Only    Level of Care/Admitting Diagnosis ED Disposition    ED Disposition Condition [facility] Area: [facility] [[id number]]  Level of Care: Telemetry [5]  Covid Evaluation: Person Under Investigation (PUI)  Isolation Risk Level: Comment  Comment: Needs isolation for zoster  Diagnosis: Herpes zoster ophthalmicus [700174]  Admitting Physician: Lavina Hamman [[id number]]  Attending Physician: Lavina Hamman [[id number]]  Estimated length of stay: past midnight tomorrow  Certification:: I certify this patient will need inpatient services for at least 2 midnights  PT Class (Do Not Modify): Inpatient [101]  PT Acc Code (Do Not Modify): Private [1]       B Medical/Surgery History Past Medical History:  Diagnosis Date  . Acute biliary pancreatitis 07/2002   thia was in 07/2004:she still has pseudocyst in tail of pancreas measuring 54 x 33 mm   . Adrenal adenoma    bilateral  . Anxiety   . Chronic pancreatitis ([place])   . Detached retina   . Diabetes mellitus ([place])   . Diverticulosis   . History of carcinoma in situ of breast 1988  . Hyperlipidemia   . Hypertension   . IBS (irritable bowel syndrome)   .  Legally blind   . Osteoarthritis   . Osteopenia   . Upper GI bleed September2004   secondary to gastritis   Past Surgical History:  Procedure Laterality Date  . CARDIAC CATHETERIZATION N/A 08/24/2014   Procedure: Left Heart Cath and Coronary Angiography;  Surgeon: Leonie Man, MD;  Location: [place] [facility];  Service: Cardiovascular;  Laterality: N/A;  . COLONOSCOPY  08/15/05   few tiny diverticula at sigmoid colon/external hemorrhoids but no polyps  . COLONOSCOPY  01/08/2012   [id number] diverticulosis. Next colonoscopy in 12/2016  . CORONARY ARTERY BYPASS GRAFT N/A 08/24/2014   Procedure: CORONARY ARTERY BYPASS GRAFTING (CABG) x three, using left internal mammary artery and right leg greater saphenous vein harvested endoscopically;  Surgeon: Ivin Poot, MD;  Location: [place];  Service: Open Heart Surgery;  Laterality: N/A;  . ECTOPIC PREGNANCY SURGERY  1950's  . ERCP with sphincterotomy  07/2002  . ESOPHAGOGASTRODUODENOSCOPY      Gastritis of body.  Otherwise normal  . ESOPHAGOGASTRODUODENOSCOPY  01/08/2012   RMR: Few scattered gastric erosions of uncertain significance-status post biopsy. Minimal chronic inflammation, no H.pylori  . LAPAROSCOPIC CHOLECYSTECTOMY    . PANCREATIC PSEUDOCYST DRAINAGE  [id number]   drained percutaneously   . right mastectomy    . tacking up of her bladder    . TEE WITHOUT CARDIOVERSION N/A 08/24/2014   Procedure: TRANSESOPHAGEAL ECHOCARDIOGRAM (TEE);  Surgeon: Ivin Poot, MD;  Location:  [place];  Service: Open Heart Surgery;  Laterality: N/A;     A IV Location/Drains/Wounds Patient Lines/Drains/Airways Status   Active Line/Drains/Airways    Name:   Placement date:   Placement time:   Site:   Days:   Peripheral IV 09/03/18 Left Forearm   09/03/18    1012    Forearm   less than 1   External Urinary Catheter   10/23/16    -    -   680   Incision (Closed) 08/24/14 Leg Right   08/24/14    2022     1471   Incision (Closed) 08/24/14 Chest Other  (Comment)   08/24/14    2022     1471          Intake/Output Last 24 hours No intake or output data in the 24 hours ending 09/03/18 1027  Labs/Imaging Results for orders placed or performed during the hospital encounter of 09/03/18 (from the past 48 hour(s))  CBC with Differential/Platelet     Status: Abnormal   Collection Time: 09/03/18  5:56 AM  Result Value Ref Range   WBC 6.8 4.0 - 10.5 K/uL   RBC 3.86 (L) 3.87 - 5.11 MIL/uL   Hemoglobin 11.3 (L) 12.0 - 15.0 g/dL   HCT 33.2 (L) 36.0 - 46.0 %   MCV 86.0 80.0 - 100.0 fL   MCH 29.3 26.0 - 34.0 pg   MCHC 34.0 30.0 - 36.0 g/dL   RDW 13.1 11.5 - 15.5 %   Platelets 119 (L) 150 - 400 K/uL    Comment: REPEATED TO VERIFY PLATELET COUNT CONFIRMED BY SMEAR SPECIMEN CHECKED FOR CLOTS    nRBC 0.0 0.0 - 0.2 %   Neutrophils Relative % 68 %   Neutro Abs 4.6 1.7 - 7.7 K/uL   Lymphocytes Relative 20 %   Lymphs Abs 1.4 0.7 - 4.0 K/uL   Monocytes Relative 12 %   Monocytes Absolute 0.8 0.1 - 1.0 K/uL   Eosinophils Relative 0 %   Eosinophils Absolute 0.0 0.0 - 0.5 K/uL   Basophils Relative 0 %   Basophils Absolute 0.0 0.0 - 0.1 K/uL   Immature Granulocytes 0 %   Abs Immature Granulocytes 0.03 0.00 - 0.07 K/uL    Comment: Performed at [facility], [address], [place], [place] [address]  Basic metabolic panel     Status: Abnormal   Collection Time: 09/03/18  5:56 AM  Result Value Ref Range   Sodium 132 (L) 135 - 145 mmol/L   Potassium 4.9 3.5 - 5.1 mmol/L   Chloride 93 (L) 98 - 111 mmol/L   CO2 25 22 - 32 mmol/L   Glucose, Bld 172 (H) 70 - 99 mg/dL   BUN 26 (H) 8 - 23 mg/dL   Creatinine, Ser 1.50 (H) 0.44 - 1.00 mg/dL   Calcium 9.2 8.9 - 10.3 mg/dL   GFR calc non Af Amer 31 (L) >60 mL/min   GFR calc Af Amer 35 (L) >60 mL/min   Anion gap 14 5 - 15    Comment: Performed at [facility], [address] [place], [place] [address]  SARS Coronavirus 2 (CEPHEID - Performed in [place] hospital lab), Hosp Order     Status: None    Collection Time: 09/03/18  6:33 AM   Specimen: Nasopharyngeal Swab  Result Value Ref Range   SARS Coronavirus 2 NEGATIVE NEGATIVE    Comment: (NOTE) If result is NEGATIVE SARS-CoV-2 target nucleic acids are NOT DETECTED. The  SARS-CoV-2 RNA is generally detectable in upper and lower  respiratory specimens during the acute phase of infection. The lowest  concentration of SARS-CoV-2 viral copies this assay can detect is 250  copies / mL. A negative result does not preclude SARS-CoV-2 infection  and should not be used as the sole basis for treatment or other  patient management decisions.  A negative result may occur with  improper specimen collection / handling, submission of specimen other  than nasopharyngeal swab, presence of viral mutation(s) within the  areas targeted by this assay, and inadequate number of viral copies  (<250 copies / mL). A negative result must be combined with clinical  observations, patient history, and epidemiological information. If result is POSITIVE SARS-CoV-2 target nucleic acids are DETECTED. The SARS-CoV-2 RNA is generally detectable in upper and lower  respiratory specimens dur ing the acute phase of infection.  Positive  results are indicative of active infection with SARS-CoV-2.  Clinical  correlation with patient history and other diagnostic information is  necessary to determine patient infection status.  Positive results do  not rule out bacterial infection or co-infection with other viruses. If result is PRESUMPTIVE POSTIVE SARS-CoV-2 nucleic acids MAY BE PRESENT.   A presumptive positive result was obtained on the submitted specimen  and confirmed on repeat testing.  While 2019 novel coronavirus  (SARS-CoV-2) nucleic acids may be present in the submitted sample  additional confirmatory testing may be necessary for epidemiological  and / or clinical management purposes  to differentiate between  SARS-CoV-2 and other Sarbecovirus currently known  to infect humans.  If clinically indicated additional testing with an alternate test  methodology [phone]) is advised. The SARS-CoV-2 RNA is generally  detectable in upper and lower respiratory sp ecimens during the acute  phase of infection. The expected result is Negative. Fact Sheet for Patients:  StrictlyIdeas.no Fact Sheet for Healthcare Providers: BankingDealers.co.za This test is not yet approved or cleared by the [place] FDA and has been authorized for detection and/or diagnosis of SARS-CoV-2 by FDA under an Emergency Use Authorization (EUA).  This EUA will remain in effect (meaning this test can be used) for the duration of the COVID-19 declaration under Section 564(b)(1) of the Act, 21 U.S.C. section 360bbb-3(b)(1), unless the authorization is terminated or revoked sooner. Performed at [facility], [address] [place], [place] [address]    Dg Pelvis 1-2 Views  Result Date: 09/03/2018 CLINICAL DATA:  Fall.  Pain EXAM: PELVIS - 1-2 VIEW COMPARISON:  CT 09/03/2018. FINDINGS: Complex right acetabular fracture noted. Slight angulation deformity present. Diffuse osteopenia. Degenerative changes lumbar spine and both hips. Aortoiliac and peripheral atherosclerotic vascular calcification. Pelvic calcifications consistent phleboliths. IMPRESSION: 1. Complex right acetabular fracture with slight angulation deformity. 2.  Diffuse osteopenia and degenerative change. 3.  Aortoiliac and peripheral vascular disease. Electronically Signed   By: Marcello Moores  Register   On: 09/03/2018 06:08   Ct Head Wo Contrast  Result Date: 09/03/2018 CLINICAL DATA:  Minor head trauma.  Fall going to bathroom. EXAM: CT HEAD WITHOUT CONTRAST CT CERVICAL SPINE WITHOUT CONTRAST TECHNIQUE: Multidetector CT imaging of the head and cervical spine was performed following the standard protocol without intravenous contrast. Multiplanar CT image reconstructions of the  cervical spine were also generated. COMPARISON:  None. FINDINGS: CT HEAD FINDINGS Brain: No evidence of acute infarction, hemorrhage, hydrocephalus, extra-axial collection or mass lesion/mass effect. Perforator infarct at the right basal ganglia, age indeterminate by imaging but chronic based on the history. Age congruent cerebral  volume loss. Vascular: Atherosclerotic calcification Skull: Negative for fracture Sinuses/Orbits: No evidence of injury.  Right cataract resection. CT CERVICAL SPINE FINDINGS Alignment: No traumatic malalignment Skull base and vertebrae: Negative for acute fracture. Atlantooccipital non segmentation. Soft tissues and spinal canal: No prevertebral fluid or swelling. No visible canal hematoma. Multinodular thyroid that is partially covered. No invasive features. Disc levels: Generalized degenerative facet spurring and disc narrowing. C4-5 ACDF with solid arthrodesis Upper chest: Negative IMPRESSION: No evidence of acute intracranial or cervical spine injury. Electronically Signed   By: Monte Fantasia M.D.   On: 09/03/2018 06:24   Ct Cervical Spine Wo Contrast  Result Date: 09/03/2018 CLINICAL DATA:  Minor head trauma.  Fall going to bathroom. EXAM: CT HEAD WITHOUT CONTRAST CT CERVICAL SPINE WITHOUT CONTRAST TECHNIQUE: Multidetector CT imaging of the head and cervical spine was performed following the standard protocol without intravenous contrast. Multiplanar CT image reconstructions of the cervical spine were also generated. COMPARISON:  None. FINDINGS: CT HEAD FINDINGS Brain: No evidence of acute infarction, hemorrhage, hydrocephalus, extra-axial collection or mass lesion/mass effect. Perforator infarct at the right basal ganglia, age indeterminate by imaging but chronic based on the history. Age congruent cerebral volume loss. Vascular: Atherosclerotic calcification Skull: Negative for fracture Sinuses/Orbits: No evidence of injury.  Right cataract resection. CT CERVICAL SPINE  FINDINGS Alignment: No traumatic malalignment Skull base and vertebrae: Negative for acute fracture. Atlantooccipital non segmentation. Soft tissues and spinal canal: No prevertebral fluid or swelling. No visible canal hematoma. Multinodular thyroid that is partially covered. No invasive features. Disc levels: Generalized degenerative facet spurring and disc narrowing. C4-5 ACDF with solid arthrodesis Upper chest: Negative IMPRESSION: No evidence of acute intracranial or cervical spine injury. Electronically Signed   By: Monte Fantasia M.D.   On: 09/03/2018 06:24   Ct Pelvis Wo Contrast  Result Date: 09/03/2018 CLINICAL DATA:  Fall going to bathroom. Unable to lift right leg due to pain. EXAM: CT PELVIS WITHOUT CONTRAST TECHNIQUE: Multidetector CT imaging of the pelvis was performed following the standard protocol without intravenous contrast. COMPARISON:  09/26/2016 abdominal CT FINDINGS: Musculoskeletal: Branching fracture in the right acetabulum extending inferiorly along the puboacetabular junction. No inferior ramus or iliac wing fracture. Right hip joint effusion and small extraperitoneal pelvic hematoma. Femur is located and intact. Incidental advanced L4-5 spinal stenosis from posterior element hypertrophy and disc bulge. Urinary Tract:  No abnormality visualized. Bowel:  Unremarkable visualized pelvic bowel loops. Vascular/Lymphatic: Atherosclerosis. Reproductive:  Unremarkable for age IMPRESSION: 1. Nondisplaced, comminuted right acetabulum fracture without inferior ramus or iliac wing involvement. 2. Small extraperitoneal pelvic hematoma. Electronically Signed   By: Monte Fantasia M.D.   On: 09/03/2018 06:30   Dg Femur Min 2 Views Right  Result Date: 09/03/2018 CLINICAL DATA:  Fall. EXAM: RIGHT FEMUR 2 VIEWS COMPARISON:  AP pelvis 09/03/2018.  CT pelvis 09/03/2018. FINDINGS: Right femur is intact. No evidence of femoral fracture or dislocation complex right acetabular fracture again noted.  Peripheral vascular calcification. IMPRESSION: 1. Right femur is intact. No evidence of femoral fracture or dislocation. 2.  Complex right acetabular fracture again noted. 3.  Peripheral vascular disease. Electronically Signed   By: Marcello Moores  Register   On: 09/03/2018 06:11    Pending Labs Unresulted Labs (From admission, onward)    Start     Ordered   09/03/18 0458  Urinalysis, Routine w reflex microscopic  Once,   STAT     09/03/18 0457          Vitals/Pain Today's Vitals  09/03/18 0800 09/03/18 0830 09/03/18 0900 09/03/18 0930  BP: 116/70 135/62 (!) 137/54 (!) 133/53  Pulse: 65 67 65 68  Resp: 15 18 (!) 21 (!) 27  Temp:      TempSrc:      SpO2: 93% 95% 94% 93%  Weight:      Height:        Isolation Precautions Airborne and Contact precautions  Medications Medications  morphine 2 MG/ML injection 2 mg (has no administration in time range)    Mobility non-ambulatory Moderate fall risk   Focused Assessments musculoskeletal   R Recommendations: See Admitting Provider Note  Report given to: Kennyth Lose RN  Additional Notes: Right arm restriction.

## 2018-09-03 NOTE — Progress Notes (Signed)
Initial Nutrition Assessment  DOCUMENTATION CODES:  Severe malnutrition in context of acute illness/injury  INTERVENTION:  Ensure Enlive po BID, each supplement provides 350 kcal and 20 grams of protein  NUTRITION DIAGNOSIS:  Severe malnutrition related to acute illness (shingles) as evidenced by a reported oral intake that met </= to 50% of needs for >/= to 5 days and a loss of >2% bw in past week  GOAL:  Patient will meet greater than or equal to 90% of their needs  MONITOR:  PO intake, Weight trends, Supplement acceptance, Diet advancement, Labs, I & O's, Skin  REASON FOR ASSESSMENT:  Malnutrition Screening Tool    ASSESSMENT:  85 y/o female w/ PMHx HTN/HLD, chronic pancreatitis, Breast cancer, IBS, Diverticulosis, anxiety, dm2. Presents to ED with R hip and groin pain following a fall at home. Xray in ED shows R acetabular fx. Admitted for orthopedic eval.   Despite her age, pt is an [place]. She shares her oral intake has not been good recently and believes dehydration is what precipitated her fall. She explains she was diagnosed with shingles the week before last. Due to pain and general malaise, she has not been eating/drinking near what she usually does.   At baseline, the pt reports she drinks 64-72 oz of water a day (she keeps track). She normally eats 3 meals and 2 snacks each day. She also tracks her weights and weighs herself before bed each night. She takes a mvi and follows a generally healthy diet. Denies any n/v/c/d, however does share she was recently prescribed pain medication from her pcp and she has been taking this with a stool softener for prophylaxis.   This past week, pt has been eating far less than 50% of her normal. She says she may just eat a pack of PB crackers for the entire day. Fluid intake has also been dramatically less.   Weight wise, pt says she was 138 lbs when she last weighed herself and reports her usual is typically 142-144 lbs. This  appears to be corroborated by recent OP weight on 6/16. Long term, pts weight has fluctuated between 135-145 for many years.   Based on her degree of oral intake and loss of >2% bw in 1 week, pt meets criteria for severe malnutrition in acute context- stemming from her shingles.   Pt seen by orthopedic surgeon immediately preceding RD. He did not feel she needs surgical intervention. As such, she has not been made NPO. She is on a soft diet?- pt denies any trouble chewing/swallowing. Pt was agreeable to Ensure Enlive.   Labs: BG: 172- 263 (last a1c was 12/27: 5.7) Na:132, k: 4.9, bun/creat:26/1.50 Meds: Colace, iron, insulin, miralax, creon, prn opiates  Recent Labs  Lab 09/03/18 0556  NA 132*  K 4.9  CL 93*  CO2 25  BUN 26*  CREATININE 1.50*  CALCIUM 9.2  GLUCOSE 172*   NUTRITION - FOCUSED PHYSICAL EXAM: Deferred-shingles  Diet Order:   Diet Order            DIET SOFT Room service appropriate? Yes; Fluid consistency: Thin  Diet effective now             EDUCATION NEEDS:  No education needs have been identified at this time  Skin:  Scabbed over lesions on face from shingles  Last BM:  Unknown  Height:  Ht Readings from Last 1 Encounters:  09/03/18 '5\' 3"'  (1.6 m)   Weight:  Wt Readings from Last 1 Encounters:  09/03/18 62.6 kg   Wt Readings from Last 10 Encounters:  09/03/18 62.6 kg  08/31/18 64.4 kg  04/29/18 65.8 kg  03/12/18 65.3 kg  11/13/17 66.6 kg  05/15/17 66.7 kg  05/11/17 67.6 kg  05/04/17 66.2 kg  11/14/16 64 kg  10/24/16 62.7 kg   Ideal Body Weight:  52.27 kg  BMI:  Body mass index is 24.45 kg/m.  Estimated Nutritional Needs:  Kcal:  1650-1800 kcals (26-29 kcal/kg bw) Protein:  75-88g Pro (1.2-1.4g/kg bw) Fluid:  >1.6 L fluid (25 ml/kg bw)  Burtis Junes RD, LDN, CNSC Clinical Nutrition Available Tues-Sat via Pager: [phone] 09/03/2018 3:30 PM

## 2018-09-03 NOTE — ED Triage Notes (Signed)
Pt slipped getting up to go to the bathroom, states she fell onto her right side.  Pt unable to lift her right leg due to pain in her groin area.  Pt denies other complaints.

## 2018-09-03 NOTE — H&P (Addendum)
[facility] History and Physical   Patient: Misty Edwards UTM:[id number]   PCP: Susy Frizzle, MD DOB: Feb 14, 1927   DOA: 09/03/2018   DOS: 09/03/2018   DOS: the patient was seen and examined on 09/03/2018  Patient coming from: The patient is coming from Home  Chief Complaint: Fall  HPI: Misty Edwards is a 85 y.o. female with Past medical history of chronic pancreatitis, HTN, HLD, IBS. Patient presents with complaint of fall.  Patient lives alone.  Recently was diagnosed with shingles and started on oral Valtrex 2 days ago.  She started having increasing pain in her left eye and was started on oral oxycodone for pain control.  She had difficulty taking p.o. food and liquids as she had severe intense pain associated with her shingles. For last several days she has been doing poorly and mostly spending her time in bed. She went to see her ophthalmologist on 09/02/2018 and she was given medication for glaucoma.  Per daughter she was told that she does not have shingles in her eye. Patient was trying to go from her bed to the bathroom, blacked out and fell on the ground.  When she woke up she found herself on the ground and dragged herself back to the bed. This happens at around 8 PM on 09/02/2018.  Early in the morning of 09/03/2018 at 1 AM she needs to drink some water and is unable to get out of the bed and at that point she started to call her grandson and daughter who in turn come to help her around 5 and she was brought to the hospital. She reported some injury to her head.  No nausea no vomiting.  No dizziness no lightheadedness.  No chest pain.  Reports severe pain in her eye as well as severe pain in her left leg.  No diarrhea no constipation reported by the patient at present.  She took her first dose of pain medication on 09/02/2018.  ED Course: Work-up including CT head and C-spine were negative.  Patient had a CT pelvis that showed evidence of acetabular fracture on right.   Orthopedic was consulted and recommended nonoperative management with pain control.  Patient also had acute kidney injury, SBP in 170s, patient was referred for admission.  At her baseline ambulates without any support And is independent for most of her ADL; manages her medication on her own.  Review of Systems: as mentioned in the history of present illness.  All other systems reviewed and are negative.  Past Medical History:  Diagnosis Date   Acute biliary pancreatitis 07/2002   thia was in 07/2004:she still has pseudocyst in tail of pancreas measuring 54 x 33 mm    Adrenal adenoma    bilateral   Anxiety    Chronic pancreatitis ([place])    Detached retina    Diabetes mellitus ([place])    Diverticulosis    History of carcinoma in situ of breast 1988   Hyperlipidemia    Hypertension    IBS (irritable bowel syndrome)    Legally blind    Osteoarthritis    Osteopenia    Upper GI bleed September2004   secondary to gastritis   Past Surgical History:  Procedure Laterality Date   CARDIAC CATHETERIZATION N/A 08/24/2014   Procedure: Left Heart Cath and Coronary Angiography;  Surgeon: Leonie Man, MD;  Location: [facility];  Service: Cardiovascular;  Laterality: N/A;   COLONOSCOPY  08/15/05   few tiny diverticula at sigmoid  colon/external hemorrhoids but no polyps   COLONOSCOPY  01/08/2012   [id number] diverticulosis. Next colonoscopy in 12/2016   CORONARY ARTERY BYPASS GRAFT N/A 08/24/2014   Procedure: CORONARY ARTERY BYPASS GRAFTING (CABG) x three, using left internal mammary artery and right leg greater saphenous vein harvested endoscopically;  Surgeon: Ivin Poot, MD;  Location: [facility];  Service: Open Heart Surgery;  Laterality: N/A;   ECTOPIC PREGNANCY SURGERY  1950's   ERCP with sphincterotomy  07/2002   ESOPHAGOGASTRODUODENOSCOPY      Gastritis of body.  Otherwise normal   ESOPHAGOGASTRODUODENOSCOPY  01/08/2012   RMR: Few scattered gastric erosions of  uncertain significance-status post biopsy. Minimal chronic inflammation, no H.pylori   LAPAROSCOPIC CHOLECYSTECTOMY     PANCREATIC PSEUDOCYST DRAINAGE  [id number]   drained percutaneously    right mastectomy     tacking up of her bladder     TEE WITHOUT CARDIOVERSION N/A 08/24/2014   Procedure: TRANSESOPHAGEAL ECHOCARDIOGRAM (TEE);  Surgeon: Ivin Poot, MD;  Location: [facility];  Service: Open Heart Surgery;  Laterality: N/A;   Social History:  reports that she has never smoked. She has never used smokeless tobacco. She reports that she does not drink alcohol or use drugs.  Allergies  Allergen Reactions   Calcium-Containing Compounds Nausea Only   Morphine And Related Other (See Comments)    Hallucination   Raloxifene Itching    Evista- Face and eyes burning   Vitamin D Analogs Nausea Only    Family History  Problem Relation Age of Onset   Ovarian cancer Mother        passed   Colon cancer Father 76       passed away in his 55's   Colon cancer Brother 48       surgery inhis 56's doing well now   Heart attack Brother        passed away age 31   Pancreatitis Brother    Kidney disease Daughter    Leukemia Son      Prior to Admission medications   Medication Sig Start Date End Date Taking? Authorizing Provider  acetaminophen (TYLENOL) 500 MG tablet Take 500-1,000 mg by mouth 3 (three) times daily as needed for mild pain. Pain.    Yes [provider]  amLODipine (NORVASC) 5 MG tablet TAKE 1 TABLET BY MOUTH ONCE DAILY 04/12/18  Yes Branch, Alphonse Guild, MD  aspirin EC 81 MG EC tablet Take 1 tablet (81 mg total) by mouth daily. 08/31/14  Yes Gold, Wayne E, PA-C  Blood Glucose Monitoring Suppl (ONE TOUCH ULTRA 2) w/Device KIT Use as directed to monitor FSBS 3x daily. Dx: E11.9. 07/13/18  Yes Susy Frizzle, MD  brimonidine-timolol (COMBIGAN) 0.2-0.5 % ophthalmic solution Place 1 drop into both eyes every 12 (twelve) hours.   Yes [provider]  CREON  24000-76000 units CPEP TAKE 2 CAPSULES BY MOUTH WITH MEALS AND 1 CAP WITH SNACKS x 2 Patient taking differently: Take 1-2 capsules by mouth 5 (five) times daily. Patient takes 2 capsules with meals and 1 capsule with snacks 05/19/18  Yes Susy Frizzle, MD  cycloSPORINE (RESTASIS) 0.05 % ophthalmic emulsion Place 1 drop into both eyes 3 (three) times daily as needed.    Yes [provider]  ferrous sulfate 325 (65 FE) MG tablet Take 325 mg by mouth daily with breakfast.   Yes [provider]  FLUoxetine (PROZAC) 20 MG tablet TAKE 1 TABLET BY MOUTH ONCE DAILY 02/15/18  Yes Jenna Luo  T, MD  furosemide (LASIX) 20 MG tablet Take 1 tablet by mouth once daily Patient taking differently: as needed.  05/18/18  Yes Susy Frizzle, MD  glucose blood (ONE TOUCH ULTRA TEST) test strip Use as directed to monitor FSBS 5 x daily. Dx: E11.9. 08/18/18  Yes Susy Frizzle, MD  insulin NPH-regular Human (NOVOLIN 70/30) (70-30) 100 UNIT/ML injection 25 units q am and 15 units q pm Patient taking differently: Inject 15-25 Units into the skin 2 (two) times daily with a meal. Patient takes 25 units in the morning and 15 units in the evening 06/03/18  Yes Susy Frizzle, MD  Insulin Syringes, Disposable, U-100 1 ML MISC 1/2 inch 31 g 05/11/17  Yes Susy Frizzle, MD  lisinopril (ZESTRIL) 2.5 MG tablet Take 1 tablet by mouth once daily Patient taking differently: Take 2.5 mg by mouth daily as needed.  08/03/18  Yes Branch, Alphonse Guild, MD  LORazepam (ATIVAN) 0.5 MG tablet TAKE 1 OR 2 TABLETS BY MOUTH AT BEDTIME AS NEEDED FOR  INSOMNIA Patient taking differently: Take 0.5-1 mg by mouth at bedtime as needed.  08/30/18  Yes Susy Frizzle, MD  Magnesium Oxide 400 (240 Mg) MG TABS TAKE 1 TABLET BY MOUTH ONCE DAILY 03/24/18  Yes Branch, Alphonse Guild, MD  metFORMIN (GLUCOPHAGE) 1000 MG tablet Take 500 mg by mouth 2 (two) times daily with a meal.   Yes [provider]  metoprolol tartrate  (LOPRESSOR) 25 MG tablet Take 1 tablet by mouth twice daily 07/07/18  Yes Susy Frizzle, MD  Multiple Vitamin (MULTIVITAMIN) capsule Take 1 capsule by mouth daily.   Yes [provider]  OneTouch Delica Lancets 60A MISC Use as directed to monitor FSBS 3x daily. Dx: E11.9. 07/13/18  Yes Susy Frizzle, MD  oxyCODONE (ROXICODONE) 5 MG immediate release tablet Take 1 tablet (5 mg total) by mouth every 4 (four) hours as needed for severe pain. 08/31/18  Yes Susy Frizzle, MD  pantoprazole (PROTONIX) 40 MG tablet Take 1 tablet by mouth once daily 08/03/18  Yes Branch, Alphonse Guild, MD  polyethylene glycol (MIRALAX / GLYCOLAX) 17 g packet Take 17 g by mouth daily as needed.   Yes [provider]  potassium chloride (K-DUR) 10 MEQ tablet TAKE ONE TABLET BY MOUTH ONCE DAILY AS NEEDED TAKES WITH FUROSEMIDE 08/03/17  Yes Susy Frizzle, MD  RELION INSULIN SYR 0.5ML/31G 31G X 5/16" 0.5 ML MISC USE AS DIRECTED 04/05/18  Yes [place], Modena Nunnery, MD  RELION INSULIN SYRINGE 1ML/31G 31G X 5/16" 1 ML MISC  05/11/17  Yes [provider]  rosuvastatin (CRESTOR) 5 MG tablet TAKE 1 TABLET BY MOUTH ONCE DAILY 02/22/18  Yes Branch, Alphonse Guild, MD  Simethicone (GAS-X EXTRA STRENGTH) 125 MG CAPS Take 125 mg by mouth 2 (two) times daily as needed (GAS).   Yes [provider]  triamcinolone cream (KENALOG) 0.1 % APPLY  CREAM EXTERNALLY TWICE DAILY Patient taking differently: as needed.  03/02/17  Yes Susy Frizzle, MD  valACYclovir (VALTREX) 1000 MG tablet Take 1 tablet (1,000 mg total) by mouth 3 (three) times daily. 08/31/18  Yes Pickard, Cammie Mcgee, MD  Wheat Dextrin (BENEFIBER DRINK MIX) PACK Take 1 Package by mouth daily.   Yes [provider]    Physical Exam: Vitals:   09/03/18 0830 09/03/18 0900 09/03/18 0930 09/03/18 1100  BP: 135/62 (!) 137/54 (!) 133/53 (!) 181/58  Pulse: 67 65 68 75  Resp: 18 (!) 21 (!)  27 20  Temp:    98 F (36.7 C)  TempSrc:      SpO2: 95%  94% 93% 100%  Weight:      Height:       General: Alert, Awake and Oriented to Time, Place and Person. Appear in mild distress, affect appropriate Eyes: PERRL, Conjunctiva normal ENT: Oral Mucosa clear moist.  Neck: no JVD, no Abnormal Mass Or lumps Cardiovascular: S1 and S2 Present, no Murmur, Peripheral Pulses Present Respiratory: normal respiratory effort, Bilateral Air entry equal and Decreased, no use of accessory muscle, Clear to Auscultation, no Crackles, no wheezes Abdomen: Bowel Sound present, Soft and no tenderness, no hernia Skin: no redness, extensive crusted shingles lesion on left forehead as well as epicanthal area.  There is also vesicular rash on the left epicanthal area and some yellowish drainage from rash, no induration Extremities: no Pedal edema, no calf tenderness Neurologic: Grossly no focal neuro deficit. Bilaterally Equal motor strength Gait not checked due to patient safety concerns.    Labs on Admission:  CBC: Recent Labs  Lab 09/03/18 0556  WBC 6.8  NEUTROABS 4.6  HGB 11.3*  HCT 33.2*  MCV 86.0  PLT 147*   Basic Metabolic Panel: Recent Labs  Lab 09/03/18 0556  NA 132*  K 4.9  CL 93*  CO2 25  GLUCOSE 172*  BUN 26*  CREATININE 1.50*  CALCIUM 9.2   GFR: Estimated Creatinine Clearance: 21 mL/min (A) (by C-G formula based on SCr of 1.5 mg/dL (H)). Liver Function Tests: No results for input(s): AST, ALT, ALKPHOS, BILITOT, PROT, ALBUMIN in the last 168 hours. No results for input(s): LIPASE, AMYLASE in the last 168 hours. No results for input(s): AMMONIA in the last 168 hours. Coagulation Profile: No results for input(s): INR, PROTIME in the last 168 hours. Cardiac Enzymes: No results for input(s): CKTOTAL, CKMB, CKMBINDEX, TROPONINI in the last 168 hours. BNP (last 3 results) No results for input(s): PROBNP in the last 8760 hours. HbA1C: No results for input(s): HGBA1C in the last 72 hours. CBG: Recent Labs  Lab 09/03/18 1307  GLUCAP  263*   Lipid Profile: No results for input(s): CHOL, HDL, LDLCALC, TRIG, CHOLHDL, LDLDIRECT in the last 72 hours. Thyroid Function Tests: No results for input(s): TSH, T4TOTAL, FREET4, T3FREE, THYROIDAB in the last 72 hours. Anemia Panel: No results for input(s): VITAMINB12, FOLATE, FERRITIN, TIBC, IRON, RETICCTPCT in the last 72 hours. Urine analysis:    Component Value Date/Time   COLORURINE YELLOW 03/12/2018 1427   APPEARANCEUR CLEAR 03/12/2018 1427   LABSPEC 1.010 03/12/2018 1427   PHURINE 5.5 03/12/2018 1427   GLUCOSEU NEGATIVE 03/12/2018 1427   HGBUR NEGATIVE 03/12/2018 1427   BILIRUBINUR NEGATIVE 10/23/2016 1112   KETONESUR NEGATIVE 03/12/2018 1427   PROTEINUR NEGATIVE 03/12/2018 1427   UROBILINOGEN 0.2 09/02/2014 1445   NITRITE NEGATIVE 03/12/2018 1427   LEUKOCYTESUR TRACE (A) 03/12/2018 1427    Radiological Exams on Admission: Dg Pelvis 1-2 Views  Result Date: 09/03/2018 CLINICAL DATA:  Fall.  Pain EXAM: PELVIS - 1-2 VIEW COMPARISON:  CT 09/03/2018. FINDINGS: Complex right acetabular fracture noted. Slight angulation deformity present. Diffuse osteopenia. Degenerative changes lumbar spine and both hips. Aortoiliac and peripheral atherosclerotic vascular calcification. Pelvic calcifications consistent phleboliths. IMPRESSION: 1. Complex right acetabular fracture with slight angulation deformity. 2.  Diffuse osteopenia and degenerative change. 3.  Aortoiliac and peripheral vascular disease. Electronically Signed   By: Marcello Moores  Register   On: 09/03/2018 06:08   Ct Head Wo Contrast  Result  Date: 09/03/2018 CLINICAL DATA:  Minor head trauma.  Fall going to bathroom. EXAM: CT HEAD WITHOUT CONTRAST CT CERVICAL SPINE WITHOUT CONTRAST TECHNIQUE: Multidetector CT imaging of the head and cervical spine was performed following the standard protocol without intravenous contrast. Multiplanar CT image reconstructions of the cervical spine were also generated. COMPARISON:  None. FINDINGS: CT  HEAD FINDINGS Brain: No evidence of acute infarction, hemorrhage, hydrocephalus, extra-axial collection or mass lesion/mass effect. Perforator infarct at the right basal ganglia, age indeterminate by imaging but chronic based on the history. Age congruent cerebral volume loss. Vascular: Atherosclerotic calcification Skull: Negative for fracture Sinuses/Orbits: No evidence of injury.  Right cataract resection. CT CERVICAL SPINE FINDINGS Alignment: No traumatic malalignment Skull base and vertebrae: Negative for acute fracture. Atlantooccipital non segmentation. Soft tissues and spinal canal: No prevertebral fluid or swelling. No visible canal hematoma. Multinodular thyroid that is partially covered. No invasive features. Disc levels: Generalized degenerative facet spurring and disc narrowing. C4-5 ACDF with solid arthrodesis Upper chest: Negative IMPRESSION: No evidence of acute intracranial or cervical spine injury. Electronically Signed   By: Monte Fantasia M.D.   On: 09/03/2018 06:24   Ct Cervical Spine Wo Contrast  Result Date: 09/03/2018 CLINICAL DATA:  Minor head trauma.  Fall going to bathroom. EXAM: CT HEAD WITHOUT CONTRAST CT CERVICAL SPINE WITHOUT CONTRAST TECHNIQUE: Multidetector CT imaging of the head and cervical spine was performed following the standard protocol without intravenous contrast. Multiplanar CT image reconstructions of the cervical spine were also generated. COMPARISON:  None. FINDINGS: CT HEAD FINDINGS Brain: No evidence of acute infarction, hemorrhage, hydrocephalus, extra-axial collection or mass lesion/mass effect. Perforator infarct at the right basal ganglia, age indeterminate by imaging but chronic based on the history. Age congruent cerebral volume loss. Vascular: Atherosclerotic calcification Skull: Negative for fracture Sinuses/Orbits: No evidence of injury.  Right cataract resection. CT CERVICAL SPINE FINDINGS Alignment: No traumatic malalignment Skull base and vertebrae:  Negative for acute fracture. Atlantooccipital non segmentation. Soft tissues and spinal canal: No prevertebral fluid or swelling. No visible canal hematoma. Multinodular thyroid that is partially covered. No invasive features. Disc levels: Generalized degenerative facet spurring and disc narrowing. C4-5 ACDF with solid arthrodesis Upper chest: Negative IMPRESSION: No evidence of acute intracranial or cervical spine injury. Electronically Signed   By: Monte Fantasia M.D.   On: 09/03/2018 06:24   Ct Pelvis Wo Contrast  Result Date: 09/03/2018 CLINICAL DATA:  Fall going to bathroom. Unable to lift right leg due to pain. EXAM: CT PELVIS WITHOUT CONTRAST TECHNIQUE: Multidetector CT imaging of the pelvis was performed following the standard protocol without intravenous contrast. COMPARISON:  09/26/2016 abdominal CT FINDINGS: Musculoskeletal: Branching fracture in the right acetabulum extending inferiorly along the puboacetabular junction. No inferior ramus or iliac wing fracture. Right hip joint effusion and small extraperitoneal pelvic hematoma. Femur is located and intact. Incidental advanced L4-5 spinal stenosis from posterior element hypertrophy and disc bulge. Urinary Tract:  No abnormality visualized. Bowel:  Unremarkable visualized pelvic bowel loops. Vascular/Lymphatic: Atherosclerosis. Reproductive:  Unremarkable for age IMPRESSION: 1. Nondisplaced, comminuted right acetabulum fracture without inferior ramus or iliac wing involvement. 2. Small extraperitoneal pelvic hematoma. Electronically Signed   By: Monte Fantasia M.D.   On: 09/03/2018 06:30   Dg Femur Min 2 Views Right  Result Date: 09/03/2018 CLINICAL DATA:  Fall. EXAM: RIGHT FEMUR 2 VIEWS COMPARISON:  AP pelvis 09/03/2018.  CT pelvis 09/03/2018. FINDINGS: Right femur is intact. No evidence of femoral fracture or dislocation complex right acetabular fracture again noted.  Peripheral vascular calcification. IMPRESSION: 1. Right femur is intact. No  evidence of femoral fracture or dislocation. 2.  Complex right acetabular fracture again noted. 3.  Peripheral vascular disease. Electronically Signed   By: Marcello Moores  Register   On: 09/03/2018 06:11   EKG: Independently reviewed. normal sinus rhythm, nonspecific ST and T waves changes.  Assessment/Plan 1. Herpes zoster ophthalmicus Patient has extensive involvement of the 5th cranial nerve.  Reportedly patient does not have any dendritic ulcers or any corneal involvement per family. Patient was started on oral Valtrex on 09/01/2018 1 g 3 times daily. While she does not have any corneal involvement she continues to have severe pain around her ear and have difficulty opening her mouth. We will continue to monitor closely. Continue airborne isolation. With her worsening creatinine clearance her dose would be 1 g daily. Monitor for now.  2.  Acute kidney injury. Hyponatremia Baseline serum creatinine 0.85. Current serum creatinine 1.50 with BUN 26. Her sodium is also relatively on the lower side. Family reports poor p.o. intake at home. Likely secondary to her pills. We will provide IV hydration for now. Monitor renal function. Medications are renally adjusted at present per pharmacy guidance. Creatinine clearance 21.  3.  Accelerated hypertension. Blood pressure in 180s. Providing IV hydralazine for now. Continue home regimen for blood pressure.  4.  Syncopal event with collapse. Patient actually had a syncopal event as well as collapse. CT scan of the head unremarkable.  No focal deficit on examination. Suspect this is secondary to pain versus pain medication. Also poor p.o. intake and dehydration and orthostatic could be possibility. PT OT will be consulted. Monitor on telemetry. Echocardiogram ordered.  5.  Right acetabular fracture. Orthopedic consulted. Recommend weightbearing as tolerated. Patient lives alone and has poor safety as she was not able to get up on her own and  her help arrived 4 hours later.  We will monitor PT OT recommendation. Appreciate orthopedic input. Patient is refusing IV morphine medication as she thinks that they make her hallucinate. Clearly patient is unable to tolerate oral narcotics as well as oxycodone prescribed for herpes zoster is probably the reason why she had a fall.  6.  Chronic pancreatitis. Constipation. Pain control. Continue home regimen.  7.  Acute thrombocytopenia. Platelet 119. Etiology not clear. We will continue to monitor for now. Gentle IV hydration.  8. Body mass index is 24.45 kg/m.  Nutrition Problem: Severe Malnutrition Etiology: acute illness  Continue dietary supplement per nutrition.  9.  Type 2 diabetes mellitus, uncontrolled with hyperglycemia, insulin-dependent, with vascular complication. CAD S/P CABG Continue aspirin. Check hemoglobin A1c. Continue insulin NPH at a reduced dose of 15 mg twice daily given patient's poor p.o. intake. Continue sensitive sliding scale insulin.  Nutrition: Carb modified soft diet DVT Prophylaxis: Subcutaneous Heparin   Advance goals of care discussion: Full code  Consults: Orthopedics  Family Communication: no family was present at bedside, at the time of interview.  Discussed with daughter on phone, Opportunity was given to ask question and all questions were answered satisfactorily.  Disposition: Admitted as inpatient, telemetry unit. Likely to be discharged SNF vs home, in 2-3 days pending renal function improvement and pain control as well as progression of the herpes zoster.  Severity of Illness: The appropriate patient status for this patient is INPATIENT. Inpatient status is judged to be reasonable and necessary in order to provide the required intensity of service to ensure the patient's safety. The patient's presenting symptoms, physical exam findings,  and initial radiographic and laboratory data in the context of their chronic comorbidities is  felt to place them at high risk for further clinical deterioration. Furthermore, it is not anticipated that the patient will be medically stable for discharge from the hospital within 2 midnights of admission. The following factors support the patient status of inpatient.   " The patient's presenting symptoms include fall. " The worrisome physical exam findings include SBP in 180s. " The initial radiographic and laboratory data are worrisome because of right acetabular fracture, creatinine clearance 21 with AKI. " The chronic co-morbidities include hypertension, osteoporosis, lives alone. I am worried she has poor PO intake due to herpes leading to AKI and poor tolerance of pain medication that lead to fall.     * I certify that at the point of admission it is my clinical judgment that the patient will require inpatient hospital care spanning beyond 2 midnights from the point of admission due to high intensity of service, high risk for further deterioration and high frequency of surveillance required.*    Author: Berle Mull, MD [facility] 09/03/2018  To reach On-call, see care teams to locate the attending and reach out to them via [email or web address]. If 7PM-7AM, please contact night-coverage If you still have difficulty reaching the attending provider, please page the [facility] (Director on Call) for [facility] on amion for assistance.

## 2018-09-03 NOTE — Consult Note (Signed)
HOSPITAL CONSULT (HIP FRACTURE )   Patient ID: Misty Edwards, female   DOB: 19-Oct-1926, 85 y.o.   MRN: [id number]  New patient  Requested by:DR PATEL PRANAV  Reason for: RT [place]   Chief Complaint  Patient presents with  . Fall     Misty Edwards is a 85 y.o. female.    HPI  FELL 09-03-2018 C/O PAIN RIGHT HIP GROIN  Severity MILD WHEN MOVING NONE AT REST  Quality ACHE    Review of Systems (all) Review of Systems  Constitutional: Negative for fever.  Musculoskeletal: Positive for falls and joint pain.  All other systems reviewed and are negative.   Past Medical History:  Diagnosis Date  . Acute biliary pancreatitis 07/2002   thia was in 07/2004:she still has pseudocyst in tail of pancreas measuring 54 x 33 mm   . Adrenal adenoma    bilateral  . Anxiety   . Chronic pancreatitis ([place])   . Detached retina   . Diabetes mellitus ([place])   . Diverticulosis   . History of carcinoma in situ of breast 1988  . Hyperlipidemia   . Hypertension   . IBS (irritable bowel syndrome)   . Legally blind   . Osteoarthritis   . Osteopenia   . Upper GI bleed September2004   secondary to gastritis    Past Surgical History:  Procedure Laterality Date  . CARDIAC CATHETERIZATION N/A 08/24/2014   Procedure: Left Heart Cath and Coronary Angiography;  Surgeon: Leonie Man, MD;  Location: [facility];  Service: Cardiovascular;  Laterality: N/A;  . COLONOSCOPY  08/15/05   few tiny diverticula at sigmoid colon/external hemorrhoids but no polyps  . COLONOSCOPY  01/08/2012   QPR:FFMBWGY diverticulosis. Next colonoscopy in 12/2016  . CORONARY ARTERY BYPASS GRAFT N/A 08/24/2014   Procedure: CORONARY ARTERY BYPASS GRAFTING (CABG) x three, using left internal mammary artery and right leg greater saphenous vein harvested endoscopically;  Surgeon: Ivin Poot, MD;  Location: [facility];  Service: Open Heart Surgery;  Laterality: N/A;  . ECTOPIC PREGNANCY SURGERY  1950's  . ERCP with  sphincterotomy  07/2002  . ESOPHAGOGASTRODUODENOSCOPY      Gastritis of body.  Otherwise normal  . ESOPHAGOGASTRODUODENOSCOPY  01/08/2012   RMR: Few scattered gastric erosions of uncertain significance-status post biopsy. Minimal chronic inflammation, no H.pylori  . LAPAROSCOPIC CHOLECYSTECTOMY    . PANCREATIC PSEUDOCYST DRAINAGE  [id number]   drained percutaneously   . right mastectomy    . tacking up of her bladder    . TEE WITHOUT CARDIOVERSION N/A 08/24/2014   Procedure: TRANSESOPHAGEAL ECHOCARDIOGRAM (TEE);  Surgeon: Ivin Poot, MD;  Location: [facility];  Service: Open Heart Surgery;  Laterality: N/A;    Family History  Problem Relation Age of Onset  . Ovarian cancer Mother        passed  . Colon cancer Father 10       passed away in his 65's  . Colon cancer Brother 45       surgery inhis 50's doing well now  . Heart attack Brother        passed away age 91  . Pancreatitis Brother   . Kidney disease Daughter   . Leukemia Son    Social History Social History   Tobacco Use  . Smoking status: Never Smoker  . Smokeless tobacco: Never Used  Substance Use Topics  . Alcohol use: No    Alcohol/week: 0.0 standard drinks  . Drug use: No  Allergies  Allergen Reactions  . Calcium-Containing Compounds Nausea Only  . Morphine And Related Other (See Comments)    Hallucination  . Raloxifene Itching    Evista- Face and eyes burning  . Vitamin D Analogs Nausea Only    Current Facility-Administered Medications  Medication Dose Route Frequency Provider Last Rate Last Dose  . acetaminophen (TYLENOL) tablet 650 mg  650 mg Oral Q6H PRN Lavina Hamman, MD       Or  . acetaminophen (TYLENOL) suppository 650 mg  650 mg Rectal Q6H PRN Lavina Hamman, MD      . amLODipine (NORVASC) tablet 5 mg  5 mg Oral Daily Lavina Hamman, MD      . aspirin EC tablet 81 mg  81 mg Oral Daily Lavina Hamman, MD      . brimonidine (ALPHAGAN) 0.2 % ophthalmic solution 1 drop  1 drop Both Eyes BID  Lavina Hamman, MD       And  . timolol (TIMOPTIC) 0.5 % ophthalmic solution 1 drop  1 drop Both Eyes BID Lavina Hamman, MD      . cycloSPORINE (RESTASIS) 0.05 % ophthalmic emulsion 1 drop  1 drop Both Eyes BID Lavina Hamman, MD      . docusate sodium (COLACE) capsule 100 mg  100 mg Oral BID Lavina Hamman, MD      . ferrous sulfate tablet 325 mg  325 mg Oral Q breakfast Lavina Hamman, MD      . FLUoxetine (PROZAC) capsule 20 mg  20 mg Oral Daily Lavina Hamman, MD      . heparin injection 5,000 Units  5,000 Units Subcutaneous Q8H Lavina Hamman, MD      . insulin aspart (novoLOG) injection 0-5 Units  0-5 Units Subcutaneous QHS Lavina Hamman, MD      . insulin aspart (novoLOG) injection 0-9 Units  0-9 Units Subcutaneous TID WC Lavina Hamman, MD      . lactated ringers infusion   Intravenous Continuous Lavina Hamman, MD      . lipase/protease/amylase (CREON) capsule 24,000 Units  24,000 Units Oral TID AC Lavina Hamman, MD      . LORazepam (ATIVAN) tablet 0.5 mg  0.5 mg Oral QHS PRN Lavina Hamman, MD      . metoprolol tartrate (LOPRESSOR) tablet 25 mg  25 mg Oral BID Lavina Hamman, MD      . morphine 2 MG/ML injection 2 mg  2 mg Intravenous Q3H PRN Lavina Hamman, MD      . ondansetron [facility]) tablet 4 mg  4 mg Oral Q6H PRN Lavina Hamman, MD       Or  . ondansetron [facility]) injection 4 mg  4 mg Intravenous Q6H PRN Lavina Hamman, MD      . oxyCODONE (Oxy IR/ROXICODONE) immediate release tablet 5 mg  5 mg Oral Q4H PRN Lavina Hamman, MD      . pantoprazole (PROTONIX) EC tablet 40 mg  40 mg Oral Daily Lavina Hamman, MD      . polyethylene glycol (MIRALAX / GLYCOLAX) packet 17 g  17 g Oral Daily Lavina Hamman, MD      . sodium chloride flush (NS) 0.9 % injection 3 mL  3 mL Intravenous Q12H Lavina Hamman, MD      . valACYclovir (VALTREX) tablet 1,000 mg  1,000 mg Oral TID Lavina Hamman, MD  Physical Exam(=30) BP (!) 181/58 (BP Location: Right Arm)    Pulse 75   Temp 98 F (36.7 C)   Resp 20   Ht 5\' 3"  (1.6 m)   Wt 62.6 kg   SpO2 100%   BMI 24.45 kg/m   Gen. Appearance NORMAL A LITTLE OVERWEIGHT Peripheral vascular system NO EDEMA PULSES ARE NORMAL COLOR NORMAL  Lymph nodes GROIN NEG  Gait CANT WALK   Left Upper extremity  Inspection revealed no malalignment or asymmetry  Assessment of range of motion: Full range of motion was recorded  Assessment of stability: Elbow wrist and hand and shoulder were stable  Assessment of muscle strength and tone revealed grade 5 muscle strength and normal muscle tone  Skin was normal without rash lesion or ulceration  Right upper extremity  Inspection revealed no malalignment or asymmetry  Assessment of range of motion: Full range of motion was recorded  Assessment of stability: Elbow wrist and hand and shoulder were stable  Assessment of muscle strength and tone revealed grade 5 muscle strength and normal muscle tone  Skin was normal   Right Lower extremity  Inspection revealed SLIGHT EXT ROT   Assessment of range of motion: DEFERRED PAIN   Assessment of stability: Ankle, knee and hip were stable  Assessment of muscle strength and tone revealed grade 5 muscle strength and normal muscle tone IN FOOT ANKLE AREA   Skin was normal   Left lower extremity Inspection revealed no malalignment or asymmetry  Assessment of range of motion: Full range of motion was recorded  Assessment of stability: Ankle, knee and hip were stable  Assessment of muscle strength and tone revealed grade 5 muscle strength and normal muscle tone  Skin was normal   Coordination was tested by finger-to-nose nose and was normal  Deep tendon reflexes were 2+ in the upper extremities  Examination of sensation by touch was normal  Mental status  Oriented to time person and place normal  Mood and affect normal without depression anxiety or agitation  Dx:   Data Reviewed  ER RECORD REVIEWED: CONFIRMS HISTORY   I  reviewed the following images and the reports and my independent interpretation is PELVIS RIGHT ACETAB FRX  CT ANTERIOR COL WITH POST FRACTURE LINE IN WB AREA OF ACET RT SIDE    Assessment  ANT COL TYPE LOW ENERGY ACETAB FRX   Plan   WBAT WITH WALKER (PROTECTED WB) 12 WEEKS   XRAYS 6 WEEKS JUDET AND PELVIS    Carole Civil MD

## 2018-09-03 NOTE — ED Provider Notes (Signed)
[facility] EMERGENCY DEPARTMENT Provider Note   CSN: [id number] Arrival date & time: 09/03/18  0432     History   Chief Complaint Chief Complaint  Patient presents with   Fall    HPI Misty Edwards is a 85 y.o. female.     Patient with right hip and groin pain after suffering a fall last evening about 8 PM.  States she was trying to go from her bedroom to the bathroom when she lost her balance and fell onto her right side.  Believes that she hit her head but was not knocked out.  Complains of pain to her right groin and hip area.  She believes that she is dehydrated because she has not been eating and drinking much due to her recurrent shingles infection of her forehead and eye.  She has had this for about the past week and saw her PCP as well as the ophthalmologist and is currently being treated with p.o. and topical medications.  She is also taking oxycodone for pain.  Patient states she laid on the ground for several hours before she was able to get back to bed.  She denies any preceding dizziness or lightheadedness.  No chest pain or shortness of breath.  No abdominal pain, nausea or vomiting.  No focal weakness, numbness or tingling.  She does not think that she passed out.  The history is provided by the patient and the EMS personnel.    Past Medical History:  Diagnosis Date   Acute biliary pancreatitis 07/2002   thia was in 07/2004:she still has pseudocyst in tail of pancreas measuring 54 x 33 mm    Adrenal adenoma    bilateral   Anxiety    Chronic pancreatitis ([place])    Detached retina    Diabetes mellitus ([place])    Diverticulosis    History of carcinoma in situ of breast 1988   Hyperlipidemia    Hypertension    IBS (irritable bowel syndrome)    Legally blind    Osteoarthritis    Osteopenia    Upper GI bleed September2004   secondary to gastritis    Patient Active Problem List   Diagnosis Date Noted   Vaginal atrophy 04/29/2018   Angiokeratoma  04/29/2018   PMB (postmenopausal bleeding) 04/29/2018   Acute on chronic diastolic CHF (congestive heart failure) ([place]) 10/23/2016   Acute respiratory failure with hypoxia ([place]) 10/23/2016   Nephrolithiasis 09/27/2016   Hydronephrosis with renal and ureteral calculus obstruction 09/27/2016   Hydronephrosis 09/27/2016   Heme positive stool 09/05/2016   Dyslipidemia 03/07/2015   Type 2 diabetes mellitus with vascular disease ([place]) 03/07/2015   Anemia 09/26/2014   Lesion of lip 09/25/2014   S/P CABG x 3 08/25/2014   NSTEMI (non-ST elevated myocardial infarction) ([place]) 08/24/2014   Acute coronary syndrome (HCC)    IBS (irritable bowel syndrome) 08/26/2012   Thrombocytopenia ([place]) 02/02/2012   Epigastric pain 02/02/2012   Gastritis 02/02/2012   Abdominal pain 12/17/2011   Normocytic anemia 12/17/2011   History of colon polyps 12/17/2011   Constipation 12/17/2011   Family history of colon cancer 08/22/2011   LLQ pain 08/21/2011   Chronic pancreatitis ([place]) 40/76/8088   HELICOBACTER PYLORI INFECTION 01/26/2008   ADENOCARCINOMA, BREAST 01/26/2008   Essential hypertension 01/26/2008   External hemorrhoids 01/26/2008   GERD 01/26/2008   CONSTIPATION 01/26/2008   PSEUDOCYST, PANCREAS 01/26/2008   GALLSTONE PANCREATITIS 01/26/2008   GI BLEEDING 01/26/2008   UTI 01/26/2008   NAUSEA 01/26/2008  VOMITING 01/26/2008   DIARRHEA 01/26/2008    Past Surgical History:  Procedure Laterality Date   CARDIAC CATHETERIZATION N/A 08/24/2014   Procedure: Left Heart Cath and Coronary Angiography;  Surgeon: Leonie Man, MD;  Location: [place] [facility];  Service: Cardiovascular;  Laterality: N/A;   COLONOSCOPY  08/15/05   few tiny diverticula at sigmoid colon/external hemorrhoids but no polyps   COLONOSCOPY  01/08/2012   OYD:XAJOINO diverticulosis. Next colonoscopy in 12/2016   CORONARY ARTERY BYPASS GRAFT N/A 08/24/2014   Procedure: CORONARY ARTERY  BYPASS GRAFTING (CABG) x three, using left internal mammary artery and right leg greater saphenous vein harvested endoscopically;  Surgeon: Ivin Poot, MD;  Location: [place];  Service: Open Heart Surgery;  Laterality: N/A;   ECTOPIC PREGNANCY SURGERY  1950's   ERCP with sphincterotomy  07/2002   ESOPHAGOGASTRODUODENOSCOPY      Gastritis of body.  Otherwise normal   ESOPHAGOGASTRODUODENOSCOPY  01/08/2012   RMR: Few scattered gastric erosions of uncertain significance-status post biopsy. Minimal chronic inflammation, no H.pylori   LAPAROSCOPIC CHOLECYSTECTOMY     PANCREATIC PSEUDOCYST DRAINAGE  [id number]   drained percutaneously    right mastectomy     tacking up of her bladder     TEE WITHOUT CARDIOVERSION N/A 08/24/2014   Procedure: TRANSESOPHAGEAL ECHOCARDIOGRAM (TEE);  Surgeon: Ivin Poot, MD;  Location: [place];  Service: Open Heart Surgery;  Laterality: N/A;     OB History    Gravida  6   Para  5   Term  5   Preterm      AB  1   Living  5     SAB      TAB      Ectopic  1   Multiple      Live Births  5            Home Medications    Prior to Admission medications   Medication Sig Start Date End Date Taking? Authorizing Provider  acetaminophen (TYLENOL) 500 MG tablet Take 500-1,000 mg by mouth 3 (three) times daily as needed for mild pain. Pain.     [provider]  amLODipine (NORVASC) 5 MG tablet TAKE 1 TABLET BY MOUTH ONCE DAILY 04/12/18   Arnoldo Lenis, MD  aspirin EC 81 MG EC tablet Take 1 tablet (81 mg total) by mouth daily. 08/31/14   Gold, Wilder Glade, PA-C  Blood Glucose Monitoring Suppl (ONE TOUCH ULTRA 2) w/Device KIT Use as directed to monitor FSBS 3x daily. Dx: E11.9. 07/13/18   Susy Frizzle, MD  CREON [phone] units CPEP TAKE 2 CAPSULES BY MOUTH WITH MEALS AND 1 CAP WITH SNACKS x 2 05/19/18   Susy Frizzle, MD  cycloSPORINE (RESTASIS) 0.05 % ophthalmic emulsion Place 1 drop into both eyes 3 (three) times daily.      [provider]  ferrous sulfate 325 (65 FE) MG tablet Take 325 mg by mouth daily with breakfast.    [provider]  FLUoxetine (PROZAC) 20 MG tablet TAKE 1 TABLET BY MOUTH ONCE DAILY 02/15/18   Susy Frizzle, MD  furosemide (LASIX) 20 MG tablet Take 1 tablet by mouth once daily Patient taking differently: as needed.  05/18/18   Susy Frizzle, MD  glucose blood (ONE TOUCH ULTRA TEST) test strip Use as directed to monitor FSBS 5 x daily. Dx: E11.9. 08/18/18   Susy Frizzle, MD  insulin NPH-regular Human (NOVOLIN 70/30) (70-30) 100 UNIT/ML injection 25 units  q am and 15 units q pm 06/03/18   Susy Frizzle, MD  Insulin Syringes, Disposable, U-100 1 ML MISC 1/2 inch 31 g 05/11/17   Susy Frizzle, MD  lisinopril (ZESTRIL) 2.5 MG tablet Take 1 tablet by mouth once daily 08/03/18   Arnoldo Lenis, MD  LORazepam (ATIVAN) 0.5 MG tablet TAKE 1 OR 2 TABLETS BY MOUTH AT BEDTIME AS NEEDED FOR  INSOMNIA 08/30/18   Susy Frizzle, MD  Magnesium Oxide 400 (240 Mg) MG TABS TAKE 1 TABLET BY MOUTH ONCE DAILY 03/24/18   Arnoldo Lenis, MD  metFORMIN (GLUCOPHAGE) 1000 MG tablet Take 500 mg by mouth 2 (two) times daily with a meal.    [provider]  metoprolol tartrate (LOPRESSOR) 25 MG tablet Take 1 tablet by mouth twice daily 07/07/18   Susy Frizzle, MD  Multiple Vitamin (MULTIVITAMIN) capsule Take 1 capsule by mouth daily.    [provider]  OneTouch Delica Lancets 80D MISC Use as directed to monitor FSBS 3x daily. Dx: E11.9. 07/13/18   Susy Frizzle, MD  oxyCODONE (ROXICODONE) 5 MG immediate release tablet Take 1 tablet (5 mg total) by mouth every 4 (four) hours as needed for severe pain. 08/31/18   Susy Frizzle, MD  pantoprazole (PROTONIX) 40 MG tablet Take 1 tablet by mouth once daily 08/03/18   Arnoldo Lenis, MD  potassium chloride (K-DUR) 10 MEQ tablet TAKE ONE TABLET BY MOUTH ONCE DAILY AS NEEDED TAKES WITH FUROSEMIDE 08/03/17   Susy Frizzle, MD  RELION INSULIN SYR 0.5ML/31G 31G X 5/16" 0.5 ML MISC USE AS DIRECTED 04/05/18   Alycia Rossetti, MD  RELION INSULIN SYRINGE 1ML/31G 31G X 5/16" 1 ML MISC  05/11/17   [provider]  rosuvastatin (CRESTOR) 5 MG tablet TAKE 1 TABLET BY MOUTH ONCE DAILY 02/22/18   Arnoldo Lenis, MD  Simethicone (GAS-X EXTRA STRENGTH) 125 MG CAPS Take 125 mg by mouth 2 (two) times daily as needed (GAS).    [provider]  triamcinolone cream (KENALOG) 0.1 % APPLY  CREAM EXTERNALLY TWICE DAILY Patient taking differently: as needed.  03/02/17   Susy Frizzle, MD  valACYclovir (VALTREX) 1000 MG tablet Take 1 tablet (1,000 mg total) by mouth 3 (three) times daily. 08/31/18   Susy Frizzle, MD    Family History Family History  Problem Relation Age of Onset   Ovarian cancer Mother        passed   Colon cancer Father 36       passed away in his 5's   Colon cancer Brother 26       surgery inhis 57's doing well now   Heart attack Brother        passed away age 80   Pancreatitis Brother    Kidney disease Daughter    Leukemia Son     Social History Social History   Tobacco Use   Smoking status: Never Smoker   Smokeless tobacco: Never Used  Substance Use Topics   Alcohol use: No    Alcohol/week: 0.0 standard drinks   Drug use: No     Allergies   Calcium-containing compounds, Morphine and related, Raloxifene, and Vitamin d analogs   Review of Systems Review of Systems  Constitutional: Negative for activity change, appetite change and fever.  HENT: Negative for congestion and rhinorrhea.   Respiratory: Negative for chest tightness.   Cardiovascular: Negative for chest pain.  Gastrointestinal: Negative for abdominal pain,  nausea and vomiting.  Genitourinary: Negative for dysuria and hematuria.  Musculoskeletal: Positive for arthralgias and myalgias. Negative for back pain and neck pain.  Skin: Positive for rash.  Neurological: Positive for  headaches. Negative for dizziness, weakness and light-headedness.   all other systems are negative except as noted in the HPI and PMH.     Physical Exam Updated Vital Signs BP (!) 143/89 (BP Location: Right Arm)    Pulse 81    Temp 98.3 F (36.8 C) (Oral)    Resp 19    Ht _0  (1.6 m)    Wt 62.6 kg    SpO2 97%    BMI 24.45 kg/m   Physical Exam Vitals signs and nursing note reviewed.  Constitutional:      General: She is not in acute distress.    Appearance: She is well-developed.  HENT:     Head: Normocephalic and atraumatic.     Mouth/Throat:     Pharynx: No oropharyngeal exudate.  Eyes:     Conjunctiva/sclera: Conjunctivae normal.     Pupils: Pupils are equal, round, and reactive to light.     Comments: Left forehead vesicular rash that appears to be scabbed over and healing.  There is injection of the sclera and conjunctiva of her left eye  Neck:     Musculoskeletal: Normal range of motion and neck supple.     Comments: No C-spine tenderness Cardiovascular:     Rate and Rhythm: Normal rate and regular rhythm.     Heart sounds: Normal heart sounds. No murmur.  Pulmonary:     Effort: Pulmonary effort is normal. No respiratory distress.     Breath sounds: Normal breath sounds.  Abdominal:     Palpations: Abdomen is soft.     Tenderness: There is no abdominal tenderness. There is no guarding or rebound.  Musculoskeletal: Normal range of motion.        General: Tenderness present.     Right lower leg: Edema present.     Comments: Right inguinal tenderness to palpation.  There is no shortening or internal rotation of the right lower extremity.  There is pain with attempted movement of the right hip.  Intact DP and PT pulses.  No T or L-spine tenderness  Skin:    General: Skin is warm.     Capillary Refill: Capillary refill takes less than 2 seconds.  Neurological:     General: No focal deficit present.     Mental Status: She is alert and oriented to person, place, and  time. Mental status is at baseline.     Cranial Nerves: No cranial nerve deficit.     Motor: No abnormal muscle tone.     Coordination: Coordination normal.     Comments: No ataxia on finger to nose bilaterally. No pronator drift. 5/5 strength throughout. CN 2-12 intact.Equal grip strength. Sensation intact.   Psychiatric:        Behavior: Behavior normal.      ED Treatments / Results  Labs (all labs ordered are listed, but only abnormal results are displayed) Labs Reviewed  CBC WITH DIFFERENTIAL/PLATELET - Abnormal; Notable for the following components:      Result Value   RBC 3.86 (*)    Hemoglobin 11.3 (*)    HCT 33.2 (*)    Platelets 119 (*)    All other components within normal limits  BASIC METABOLIC PANEL - Abnormal; Notable for the following components:   Sodium 132 (*)  Chloride 93 (*)    Glucose, Bld 172 (*)    BUN 26 (*)    Creatinine, Ser 1.50 (*)    GFR calc non Af Amer 31 (*)    GFR calc Af Amer 35 (*)    All other components within normal limits  SARS CORONAVIRUS 2 (HOSPITAL ORDER, [place] [facility])  URINALYSIS, ROUTINE W REFLEX MICROSCOPIC    EKG EKG Interpretation  Date/Time:  Friday September 03 2018 05:05:32 EDT Ventricular Rate:  75 PR Interval:    QRS Duration: 84 QT Interval:  403 QTC Calculation: 451 R Axis:   5 Text Interpretation:  Sinus rhythm Borderline low voltage, extremity leads Nonspecific T abnormalities, lateral leads No significant change was found Confirmed by Ezequiel Essex [phone]) on 09/03/2018 5:08:22 AM   Radiology Dg Pelvis 1-2 Views  Result Date: 09/03/2018 CLINICAL DATA:  Fall.  Pain EXAM: PELVIS - 1-2 VIEW COMPARISON:  CT 09/03/2018. FINDINGS: Complex right acetabular fracture noted. Slight angulation deformity present. Diffuse osteopenia. Degenerative changes lumbar spine and both hips. Aortoiliac and peripheral atherosclerotic vascular calcification. Pelvic calcifications consistent phleboliths.  IMPRESSION: 1. Complex right acetabular fracture with slight angulation deformity. 2.  Diffuse osteopenia and degenerative change. 3.  Aortoiliac and peripheral vascular disease. Electronically Signed   By: Marcello Moores  Register   On: 09/03/2018 06:08   Ct Head Wo Contrast  Result Date: 09/03/2018 CLINICAL DATA:  Minor head trauma.  Fall going to bathroom. EXAM: CT HEAD WITHOUT CONTRAST CT CERVICAL SPINE WITHOUT CONTRAST TECHNIQUE: Multidetector CT imaging of the head and cervical spine was performed following the standard protocol without intravenous contrast. Multiplanar CT image reconstructions of the cervical spine were also generated. COMPARISON:  None. FINDINGS: CT HEAD FINDINGS Brain: No evidence of acute infarction, hemorrhage, hydrocephalus, extra-axial collection or mass lesion/mass effect. Perforator infarct at the right basal ganglia, age indeterminate by imaging but chronic based on the history. Age congruent cerebral volume loss. Vascular: Atherosclerotic calcification Skull: Negative for fracture Sinuses/Orbits: No evidence of injury.  Right cataract resection. CT CERVICAL SPINE FINDINGS Alignment: No traumatic malalignment Skull base and vertebrae: Negative for acute fracture. Atlantooccipital non segmentation. Soft tissues and spinal canal: No prevertebral fluid or swelling. No visible canal hematoma. Multinodular thyroid that is partially covered. No invasive features. Disc levels: Generalized degenerative facet spurring and disc narrowing. C4-5 ACDF with solid arthrodesis Upper chest: Negative IMPRESSION: No evidence of acute intracranial or cervical spine injury. Electronically Signed   By: Monte Fantasia M.D.   On: 09/03/2018 06:24   Ct Cervical Spine Wo Contrast  Result Date: 09/03/2018 CLINICAL DATA:  Minor head trauma.  Fall going to bathroom. EXAM: CT HEAD WITHOUT CONTRAST CT CERVICAL SPINE WITHOUT CONTRAST TECHNIQUE: Multidetector CT imaging of the head and cervical spine was performed  following the standard protocol without intravenous contrast. Multiplanar CT image reconstructions of the cervical spine were also generated. COMPARISON:  None. FINDINGS: CT HEAD FINDINGS Brain: No evidence of acute infarction, hemorrhage, hydrocephalus, extra-axial collection or mass lesion/mass effect. Perforator infarct at the right basal ganglia, age indeterminate by imaging but chronic based on the history. Age congruent cerebral volume loss. Vascular: Atherosclerotic calcification Skull: Negative for fracture Sinuses/Orbits: No evidence of injury.  Right cataract resection. CT CERVICAL SPINE FINDINGS Alignment: No traumatic malalignment Skull base and vertebrae: Negative for acute fracture. Atlantooccipital non segmentation. Soft tissues and spinal canal: No prevertebral fluid or swelling. No visible canal hematoma. Multinodular thyroid that is partially covered. No invasive features. Disc levels: Generalized  degenerative facet spurring and disc narrowing. C4-5 ACDF with solid arthrodesis Upper chest: Negative IMPRESSION: No evidence of acute intracranial or cervical spine injury. Electronically Signed   By: Monte Fantasia M.D.   On: 09/03/2018 06:24   Ct Pelvis Wo Contrast  Result Date: 09/03/2018 CLINICAL DATA:  Fall going to bathroom. Unable to lift right leg due to pain. EXAM: CT PELVIS WITHOUT CONTRAST TECHNIQUE: Multidetector CT imaging of the pelvis was performed following the standard protocol without intravenous contrast. COMPARISON:  09/26/2016 abdominal CT FINDINGS: Musculoskeletal: Branching fracture in the right acetabulum extending inferiorly along the puboacetabular junction. No inferior ramus or iliac wing fracture. Right hip joint effusion and small extraperitoneal pelvic hematoma. Femur is located and intact. Incidental advanced L4-5 spinal stenosis from posterior element hypertrophy and disc bulge. Urinary Tract:  No abnormality visualized. Bowel:  Unremarkable visualized pelvic bowel  loops. Vascular/Lymphatic: Atherosclerosis. Reproductive:  Unremarkable for age IMPRESSION: 1. Nondisplaced, comminuted right acetabulum fracture without inferior ramus or iliac wing involvement. 2. Small extraperitoneal pelvic hematoma. Electronically Signed   By: Monte Fantasia M.D.   On: 09/03/2018 06:30   Dg Femur Min 2 Views Right  Result Date: 09/03/2018 CLINICAL DATA:  Fall. EXAM: RIGHT FEMUR 2 VIEWS COMPARISON:  AP pelvis 09/03/2018.  CT pelvis 09/03/2018. FINDINGS: Right femur is intact. No evidence of femoral fracture or dislocation complex right acetabular fracture again noted. Peripheral vascular calcification. IMPRESSION: 1. Right femur is intact. No evidence of femoral fracture or dislocation. 2.  Complex right acetabular fracture again noted. 3.  Peripheral vascular disease. Electronically Signed   By: Marcello Moores  Register   On: 09/03/2018 06:11    Procedures Procedures (including critical care time)  Medications Ordered in ED Medications - No data to display   Initial Impression / Assessment and Plan / ED Course  I have reviewed the triage vital signs and the nursing notes.  Pertinent labs & imaging results that were available during my care of the patient were reviewed by me and considered in my medical decision making (see chart for details).       Fall with right groin pain and possible head injury.  No loss of consciousness.  EKG is sinus rhythm.  Imaging is remarkable for right acetabular fracture.  CT head and C-spine are negative.  Discussed with Dr. Aline Brochure orthopedics who states this is nonoperative and patient can stay at any pain and be admitted for pain control and therapies.  Labs are reassuring.  It is unclear whether patient had syncope as she states her "vision went black" but she knew that she was falling.  She denies any preceding dizziness or lightheadedness.  Notably she has ongoing zoster infection of her left forehead and eye.  This was discussed  with patient's daughter Misty Edwards.  Patient did see ophthalmology yesterday was prescribed Combigan drops which are not available at this facility.  Patient's daughter agrees to bring these in.  Patient discussed with Dr. Posey Pronto the hospitalist service.  Will maintain airborne isolation for now given her zoster infection. Patient will be admitted for orthopedic, PT, OT, pain control.  May also benefit from syncope work-up as it appears she may have had syncope leading to this fall. Discussed with Dr. Posey Pronto.  Final Clinical Impressions(s) / ED Diagnoses   Final diagnoses:  Closed nondisplaced fracture of anterior wall of right acetabulum, initial encounter ([place])  Herpes zoster ophthalmicus of left eye    ED Discharge Orders    None  Ezequiel Essex, MD 09/03/18 [phone]

## 2018-09-03 NOTE — Evaluation (Signed)
Physical Therapy Evaluation Patient Details Name: Misty Edwards MRN: [id number] DOB: 08/03/1926 Today's Date: 09/03/2018   History of Present Illness  Patient with right hip and groin pain after suffering a fall last evening about 8 PM.  States she was trying to go from her bedroom to the bathroom when she lost her balance and fell onto her right side.  Believes that she hit her head but was not knocked out.  Complains of pain to her right groin and hip area.  She believes that she is dehydrated because she has not been eating and drinking much due to her recurrent shingles infection of her forehead and eye.  She has had this for about the past week and saw her PCP as well as the ophthalmologist and is currently being treated with p.o. and topical medications.  She is also taking oxycodone for pain.  Patient states she laid on the ground for several hours before she was able to get back to bed.  She denies any preceding dizziness or lightheadedness.  No chest pain or shortness of breath.  No abdominal pain, nausea or vomiting.  No focal weakness, numbness or tingling.  She does not think that she passed out.    Clinical Impression  Patient requires assistance move RLE during bed mobility due to increased hip pain, demonstrates poor tolerance for weightbearing on RLE using RW and limited to a few unsteady labored steps at bedside during transfer to chair and at high risk for falls.  Patient tolerated sitting up in chair after therapy with understanding acknowledged to ask for help when needing to get up - RN/NT notified.  Patient will benefit from continued physical therapy in hospital and recommended venue below to increase strength, balance, endurance for safe ADLs and gait.    Follow Up Recommendations SNF    Equipment Recommendations  None recommended by PT    Recommendations for Other Services       Precautions / Restrictions Precautions Precautions: Fall Precaution Comments: right  acetabular fracture Restrictions Weight Bearing Restrictions: Yes RLE Weight Bearing: Weight bearing as tolerated      Mobility  Bed Mobility Overal bed mobility: Needs Assistance Bed Mobility: Supine to Sit     Supine to sit: Mod assist     General bed mobility comments: slow labored movement with assistance to move RLE due to pain  Transfers Overall transfer level: Needs assistance Equipment used: Rolling walker (2 wheeled) Transfers: Sit to/from [facility] Sit to Stand: Mod assist Stand pivot transfers: Mod assist       General transfer comment: limted mostly due to increased pain with weightbearing RLE  Ambulation/Gait Ambulation/Gait assistance: Mod assist;Max assist Gait Distance (Feet): 3 Feet Assistive device: Rolling walker (2 wheeled) Gait Pattern/deviations: Decreased step length - right;Decreased step length - left;Decreased stance time - right;Decreased stride length Gait velocity: slow   General Gait Details: limited to 4-5 slow unsteady labored steps at bedside with poor tolerance for weightbearing on RLE due to pain  Stairs            Wheelchair Mobility    Modified Rankin (Stroke Patients Only)       Balance Overall balance assessment: Needs assistance Sitting-balance support: Feet supported;No upper extremity supported Sitting balance-Leahy Scale: Fair Sitting balance - Comments: seated at bedside   Standing balance support: During functional activity;Bilateral upper extremity supported Standing balance-Leahy Scale: Poor Standing balance comment: fair/poor using RW  Pertinent Vitals/Pain Pain Assessment: Faces Faces Pain Scale: Hurts whole lot Pain Location: right hip with movement and weightbearing Pain Descriptors / Indicators: Aching;Sore;Sharp;Grimacing;Guarding Pain Intervention(s): Limited activity within patient's tolerance;Monitored during session;Premedicated before  session    Home Living Family/patient expects to be discharged to:: Private residence Living Arrangements: Alone Available Help at Discharge: Family;Available PRN/intermittently Type of Home: Mobile home Home Access: Stairs to enter Entrance Stairs-Rails: Right;Left;Can reach both Entrance Stairs-Number of Steps: 5 Home Layout: One level Home Equipment: Walker - 2 wheels;Cane - single point;Bedside commode;Shower seat Additional Comments: patient legally blind    Prior Function Level of Independence: Needs assistance   Gait / Transfers Assistance Needed: household ambulator with RW PRN  ADL's / Homemaking Assistance Needed: assisted by family        Hand Dominance   Dominant Hand: Right    Extremity/Trunk Assessment   Upper Extremity Assessment Upper Extremity Assessment: Generalized weakness    Lower Extremity Assessment Lower Extremity Assessment: Generalized weakness;RLE deficits/detail;LLE deficits/detail RLE Deficits / Details: grossly -3/5 RLE: Unable to fully assess due to pain LLE Deficits / Details: grossly 4/5    Cervical / Trunk Assessment Cervical / Trunk Assessment: Normal  Communication      Cognition Arousal/Alertness: Awake/alert Behavior During Therapy: WFL for tasks assessed/performed Overall Cognitive Status: Within Functional Limits for tasks assessed                                        General Comments      Exercises     Assessment/Plan    PT Assessment Patient needs continued PT services  PT Problem List Decreased strength;Decreased activity tolerance;Decreased balance;Decreased mobility       PT Treatment Interventions Gait training;Stair training;Functional mobility training;Therapeutic activities;Therapeutic exercise;Patient/family education    PT Goals (Current goals can be found in the Care Plan section)  Acute Rehab PT Goals Patient Stated Goal: return home after rehab PT Goal Formulation: With  patient Time For Goal Achievement: 09/17/18 Potential to Achieve Goals: Good    Frequency Min 4X/week   Barriers to discharge        Co-evaluation               AM-PAC PT "6 Clicks" Mobility  Outcome Measure Help needed turning from your back to your side while in a flat bed without using bedrails?: A Lot Help needed moving from lying on your back to sitting on the side of a flat bed without using bedrails?: A Lot Help needed moving to and from a bed to a chair (including a wheelchair)?: A Lot Help needed standing up from a chair using your arms (e.g., wheelchair or bedside chair)?: A Lot Help needed to walk in hospital room?: A Lot Help needed climbing 3-5 steps with a railing? : Total 6 Click Score: 11    End of Session   Activity Tolerance: Patient tolerated treatment well;Patient limited by fatigue Patient left: in chair;with call bell/phone within reach Nurse Communication: Mobility status PT Visit Diagnosis: Unsteadiness on feet (R26.81);Other abnormalities of gait and mobility (R26.89);Muscle weakness (generalized) (M62.81)    Time: 8938-1017 PT Time Calculation (min) (ACUTE ONLY): 31 min   Charges:   PT Evaluation $PT Eval Moderate Complexity: 1 Mod PT Treatments $Therapeutic Activity: 23-37 mins        4:11 PM, 09/03/18 Lonell Grandchild, MPT Physical Therapist with [facility] [phone] office [phone]  mobile phone

## 2018-09-03 NOTE — Plan of Care (Signed)
  Problem: Acute Rehab PT Goals(only PT should resolve) Goal: Pt Will Go Supine/Side To Sit Outcome: Progressing Flowsheets (Taken 09/03/2018 1612) Pt will go Supine/Side to Sit: with minimal assist Goal: Patient Will Transfer Sit To/From Stand Outcome: Progressing Flowsheets (Taken 09/03/2018 1612) Patient will transfer sit to/from stand: with minimal assist Goal: Pt Will Transfer Bed To Chair/Chair To Bed Outcome: Progressing Flowsheets (Taken 09/03/2018 1612) Pt will Transfer Bed to Chair/Chair to Bed: with min assist Goal: Pt Will Ambulate Outcome: Progressing Flowsheets (Taken 09/03/2018 1612) Pt will Ambulate:  25 feet  with minimal assist  with moderate assist  with rolling walker   4:13 PM, 09/03/18 Lonell Grandchild, MPT Physical Therapist with [facility] [phone] office [phone] mobile phone

## 2018-09-04 LAB — RENAL FUNCTION PANEL
Albumin: 3.8 g/dL (ref 3.5–5.0)
Anion gap: 9 (ref 5–15)
BUN: 16 mg/dL (ref 8–23)
CO2: 25 mmol/L (ref 22–32)
Calcium: 8.6 mg/dL — ABNORMAL LOW (ref 8.9–10.3)
Chloride: 96 mmol/L — ABNORMAL LOW (ref 98–111)
Creatinine, Ser: 0.93 mg/dL (ref 0.44–1.00)
GFR calc Af Amer: >60 mL/min (ref >60)
GFR calc non Af Amer: 54 mL/min — ABNORMAL LOW (ref >60)
Glucose, Bld: 201 mg/dL — ABNORMAL HIGH (ref 70–99)
Phosphorus: 3.1 mg/dL (ref 2.5–4.6)
Potassium: 4.1 mmol/L (ref 3.5–5.1)
Sodium: 130 mmol/L — ABNORMAL LOW (ref 135–145)

## 2018-09-04 LAB — CBC
HCT: 31.6 % — ABNORMAL LOW (ref 36.0–46.0)
Hemoglobin: 10.5 g/dL — ABNORMAL LOW (ref 12.0–15.0)
MCH: 29.2 pg (ref 26.0–34.0)
MCHC: 33.2 g/dL (ref 30.0–36.0)
MCV: 87.8 fL (ref 80.0–100.0)
Platelets: 124 10*3/uL — ABNORMAL LOW (ref 150–400)
RBC: 3.6 MIL/uL — ABNORMAL LOW (ref 3.87–5.11)
RDW: 13.2 % (ref 11.5–15.5)
WBC: 6.9 10*3/uL (ref 4.0–10.5)
nRBC: 0 % (ref 0.0–0.2)

## 2018-09-04 LAB — GLUCOSE, CAPILLARY
Glucose-Capillary: 185 mg/dL — ABNORMAL HIGH (ref 70–99)
Glucose-Capillary: 285 mg/dL — ABNORMAL HIGH (ref 70–99)
Glucose-Capillary: 144 mg/dL — ABNORMAL HIGH (ref 70–99)
Glucose-Capillary: 284 mg/dL — ABNORMAL HIGH (ref 70–99)

## 2018-09-04 LAB — MAGNESIUM: Magnesium: 2.2 mg/dL (ref 1.7–2.4)

## 2018-09-04 LAB — CK: Total CK: 66 U/L (ref 38–234)

## 2018-09-04 MED ORDER — ALUM & MAG HYDROXIDE-SIMETH 200-200-20 MG/5ML PO SUSP
30.0000 mL | Freq: Four times a day (QID) | ORAL | Status: DC | PRN
  Administered 2018-09-04: 30 mL via ORAL
  Filled 2018-09-04: qty 30

## 2018-09-04 NOTE — Progress Notes (Signed)
[facility] Progress Note  Patient: Misty Edwards YKD:[id number]   PCP: Susy Frizzle, MD DOB: 05-13-1926   DOA: 09/03/2018   DOS: 09/04/2018   Date of Service: the patient was seen and examined on 09/04/2018  Brief hospital course: Pt. with PMH of chronic pancreatitis, HTN, HLD, IBS; admitted on 09/03/2018, presented with complaint of fall, was found to have acute kidney injury, probably orthostatic hypotension versus syncope. Currently further plan is continue further work-up.  Subjective: No nausea or vomiting.  Oral intake is improving.  No fever no chills.  No chest pain.  Pain is well controlled as long as the patient is not moving.  Assessment and Plan: 1. Herpes zoster Left face. No ophthalmic involvement. Patient has extensive involvement of the 5th cranial nerve.  Reportedly patient does not have any dendritic ulcers or any corneal involvement per family. Patient was started on oral Valtrex on 09/01/2018 1 g 3 times daily. While she does not have any corneal involvement she continues to have severe pain around her ear and have difficulty opening her mouth. We will continue to monitor closely. Continue airborne isolation. With her worsening creatinine clearance her dose would be 1 g daily. Monitor for now.  2.  Acute kidney injury. Hyponatremia Baseline serum creatinine 0.85. Current serum creatinine 1.50 with BUN 26. Her sodium is also relatively on the lower side. Family reports poor p.o. intake at home. Likely secondary to zoster. Renal function improving with IV hydration.  Will reduce the fluid. Monitor renal function. Medications are renally adjusted at present per pharmacy guidance. Creatinine clearance 37  3.  Accelerated hypertension. Blood pressure in 180s. Providing IV hydralazine for now. Continue home regimen for blood pressure.  4.  Syncopal event with collapse. Patient actually had a syncopal event as well as collapse. CT scan of the head  unremarkable.  No focal deficit on examination. Suspect this is secondary to pain versus pain medication. Also poor p.o. intake and dehydration and orthostatic could be possibility. PT OT will be consulted. Monitor on telemetry. Echocardiogram ordered.  5.  Right acetabular fracture. Orthopedic consulted. Recommend weightbearing as tolerated. Patient lives alone and has poor safety as she was not able to get up on her own and her help arrived 4 hours later.  We will monitor PT OT recommendation. Appreciate orthopedic input. Patient is refusing IV morphine medication as she thinks that they make her hallucinate. Clearly patient is unable to tolerate oral narcotics as well as oxycodone prescribed for herpes zoster is probably the reason why she had a fall.  6.  Chronic pancreatitis. Constipation. Pain control. Continue home regimen.  7.  Acute thrombocytopenia. Platelet 119.  Now 124 today Etiology not clear. We will continue to monitor for now.  8. Body mass index is 24.45 kg/m.  Nutrition Problem: Severe Malnutrition Etiology: acute illness  Continue dietary supplement per nutrition.  9.  Type 2 diabetes mellitus, uncontrolled with hyperglycemia, insulin-dependent, with vascular complication. CAD S/P CABG Continue aspirin. Pending hemoglobin A1c. Continue insulin NPH at a reduced dose of 15 mg twice daily given patient's poor p.o. intake. Continue sensitive sliding scale insulin.  Diet: Carb modified diet DVT Prophylaxis: Subcutaneous Lovenox  Advance goals of care discussion: full code  Family Communication: no family was present at bedside, at the time of interview.  Disposition:  Discharge to SNF .  Consultants: none Procedures: none  Scheduled Meds: . amLODipine  5 mg Oral Daily  . aspirin EC  81 mg Oral Daily  .  brimonidine  1 drop Both Eyes BID   And  . timolol  1 drop Both Eyes BID  . cycloSPORINE  1 drop Both Eyes BID  . docusate sodium  100 mg  Oral BID  . feeding supplement (ENSURE ENLIVE)  237 mL Oral BID BM  . ferrous sulfate  325 mg Oral Q breakfast  . FLUoxetine  20 mg Oral Daily  . heparin  5,000 Units Subcutaneous Q8H  . insulin aspart  0-5 Units Subcutaneous QHS  . insulin aspart  0-9 Units Subcutaneous TID WC  . insulin NPH Human  15 Units Subcutaneous BID AC  . lipase/protease/amylase  24,000 Units Oral TID AC  . metoprolol tartrate  25 mg Oral BID  . pantoprazole  40 mg Oral Daily  . polyethylene glycol  17 g Oral Daily  . sodium chloride flush  3 mL Intravenous Q12H  . valACYclovir  1,000 mg Oral Daily   Continuous Infusions: PRN Meds: acetaminophen **OR** acetaminophen, alum & mag hydroxide-simeth, fentaNYL (SUBLIMAZE) injection, LORazepam, ondansetron **OR** ondansetron (ZOFRAN) IV, oxyCODONE Antibiotics: Anti-infectives (From admission, onward)   Start     Dose/Rate Route Frequency Ordered Stop   09/04/18 1000  valACYclovir (VALTREX) tablet 1,000 mg     1,000 mg Oral Daily 09/03/18 1554     09/03/18 1700  valACYclovir (VALTREX) tablet 1,000 mg     1,000 mg Oral Once 09/03/18 1547 09/03/18 1718   09/03/18 1600  valACYclovir (VALTREX) tablet 1,000 mg  Status:  Discontinued     1,000 mg Oral 3 times daily 09/03/18 1254 09/03/18 1554       Objective: Physical Exam: Vitals:   09/03/18 1636 09/03/18 2136 09/04/18 0500 09/04/18 0537  BP: (!) 110/47 (!) 153/92  (!) 147/63  Pulse: 64 81  65  Resp: 16 20  18   Temp: 98.1 F (36.7 C) 98.1 F (36.7 C)  97.6 F (36.4 C)  TempSrc: Oral Oral  Oral  SpO2: 95% 95%  94%  Weight:   64.2 kg   Height:        Intake/Output Summary (Last 24 hours) at 09/04/2018 1825 Last data filed at 09/04/2018 1257 Gross per 24 hour  Intake 1071.79 ml  Output 1200 ml  Net -128.21 ml   Filed Weights   09/03/18 0442 09/04/18 0500  Weight: 62.6 kg 64.2 kg   General: alert and oriented to time, place, and person. Appear in moderate distress, affect appropriate Eyes: PERRL,  Conjunctiva normal ENT: Oral Mucosa Clear, moist  Neck: no JVD, no Abnormal Mass Or lumps Cardiovascular: S1 and S2 Present, no Murmur, peripheral pulses symmetrical Respiratory: normal respiratory effort, Bilateral Air entry equal and Decreased, no use of accessory muscle, Clear to Auscultation, no Crackles, no wheezes Abdomen: Bowel Sound present, Soft and no tenderness, no hernia Skin: Some yellowish discharge in the left epicanthal fold.  No new vesicle.  Oral vesicles are currently scabbed. Extremities: no Pedal edema, no calf tenderness Neurologic: normal without focal findings, mental status, speech normal, alert and oriented x3, PERLA, Motor strength 5/5 and symmetric and sensation grossly normal to light touch Gait not checked due to patient safety concerns  Data Reviewed: CBC: Recent Labs  Lab 09/03/18 0556 09/04/18 0814  WBC 6.8 6.9  NEUTROABS 4.6  --   HGB 11.3* 10.5*  HCT 33.2* 31.6*  MCV 86.0 87.8  PLT 119* 160*   Basic Metabolic Panel: Recent Labs  Lab 09/03/18 0556 09/04/18 0814  NA 132* 130*  K 4.9 4.1  CL  93* 96*  CO2 25 25  GLUCOSE 172* 201*  BUN 26* 16  CREATININE 1.50* 0.93  CALCIUM 9.2 8.6*  MG  --  2.2  PHOS  --  3.1    Liver Function Tests: Recent Labs  Lab 09/04/18 0814  ALBUMIN 3.8   No results for input(s): LIPASE, AMYLASE in the last 168 hours. No results for input(s): AMMONIA in the last 168 hours. Coagulation Profile: No results for input(s): INR, PROTIME in the last 168 hours. Cardiac Enzymes: Recent Labs  Lab 09/04/18 0814  CKTOTAL 66   BNP (last 3 results) No results for input(s): PROBNP in the last 8760 hours. CBG: Recent Labs  Lab 09/03/18 1632 09/03/18 2133 09/04/18 0744 09/04/18 1106 09/04/18 1622  GLUCAP 348* 288* 185* 285* 144*   Studies: No results found.   Time spent: 35 minutes  Author: Berle Mull, MD [facility] 09/04/2018 6:25 PM  To reach On-call, see care teams to locate the attending  and reach out to them via [email or web address]. If 7PM-7AM, please contact night-coverage If you still have difficulty reaching the attending provider, please page the [facility] (Director on Call) for [facility] on amion for assistance.

## 2018-09-05 ENCOUNTER — Inpatient Hospital Stay (HOSPITAL_COMMUNITY): Payer: Medicare HMO

## 2018-09-05 LAB — RENAL FUNCTION PANEL
Albumin: 3.2 g/dL — ABNORMAL LOW (ref 3.5–5.0)
Anion gap: 11 (ref 5–15)
BUN: 17 mg/dL (ref 8–23)
CO2: 26 mmol/L (ref 22–32)
Calcium: 8.6 mg/dL — ABNORMAL LOW (ref 8.9–10.3)
Chloride: 95 mmol/L — ABNORMAL LOW (ref 98–111)
Creatinine, Ser: 0.84 mg/dL (ref 0.44–1.00)
GFR calc Af Amer: >60 mL/min (ref >60)
GFR calc non Af Amer: >60 mL/min (ref >60)
Glucose, Bld: 135 mg/dL — ABNORMAL HIGH (ref 70–99)
Phosphorus: 3.7 mg/dL (ref 2.5–4.6)
Potassium: 3.8 mmol/L (ref 3.5–5.1)
Sodium: 132 mmol/L — ABNORMAL LOW (ref 135–145)

## 2018-09-05 LAB — GLUCOSE, CAPILLARY
Glucose-Capillary: 148 mg/dL — ABNORMAL HIGH (ref 70–99)
Glucose-Capillary: 149 mg/dL — ABNORMAL HIGH (ref 70–99)
Glucose-Capillary: 426 mg/dL — ABNORMAL HIGH (ref 70–99)
Glucose-Capillary: 171 mg/dL — ABNORMAL HIGH (ref 70–99)
Glucose-Capillary: 121 mg/dL — ABNORMAL HIGH (ref 70–99)

## 2018-09-05 LAB — CBC
HCT: 30.6 % — ABNORMAL LOW (ref 36.0–46.0)
Hemoglobin: 9.8 g/dL — ABNORMAL LOW (ref 12.0–15.0)
MCH: 28.2 pg (ref 26.0–34.0)
MCHC: 32 g/dL (ref 30.0–36.0)
MCV: 88.2 fL (ref 80.0–100.0)
Platelets: 122 10*3/uL — ABNORMAL LOW (ref 150–400)
RBC: 3.47 MIL/uL — ABNORMAL LOW (ref 3.87–5.11)
RDW: 12.8 % (ref 11.5–15.5)
WBC: 5.4 10*3/uL (ref 4.0–10.5)
nRBC: 0 % (ref 0.0–0.2)

## 2018-09-05 LAB — CK: Total CK: 40 U/L (ref 38–234)

## 2018-09-05 MED ORDER — INSULIN NPH (HUMAN) (ISOPHANE) 100 UNIT/ML ~~LOC~~ SUSP
15.0000 [IU] | Freq: Every day | SUBCUTANEOUS | Status: DC
  Administered 2018-09-05 – 2018-09-06 (×2): 15 [IU] via SUBCUTANEOUS
  Filled 2018-09-05: qty 10

## 2018-09-05 MED ORDER — INSULIN ASPART 100 UNIT/ML ~~LOC~~ SOLN: 0.0000 [IU] | Freq: Every day | SUBCUTANEOUS | Status: DC

## 2018-09-05 MED ORDER — INSULIN ASPART 100 UNIT/ML ~~LOC~~ SOLN
15.0000 [IU] | Freq: Once | SUBCUTANEOUS | Status: AC
  Administered 2018-09-05: 15 [IU] via SUBCUTANEOUS

## 2018-09-05 MED ORDER — VALACYCLOVIR HCL 500 MG PO TABS
1000.0000 mg | ORAL_TABLET | Freq: Two times a day (BID) | ORAL | Status: DC
  Administered 2018-09-05 – 2018-09-06 (×2): 1000 mg via ORAL
  Filled 2018-09-05 (×2): qty 2

## 2018-09-05 MED ORDER — INSULIN ASPART 100 UNIT/ML ~~LOC~~ SOLN
0.0000 [IU] | Freq: Three times a day (TID) | SUBCUTANEOUS | Status: DC
  Administered 2018-09-05 – 2018-09-06 (×2): 3 [IU] via SUBCUTANEOUS
  Administered 2018-09-06 (×2): 8 [IU] via SUBCUTANEOUS

## 2018-09-05 MED ORDER — HYDRALAZINE HCL 20 MG/ML IJ SOLN
10.0000 mg | Freq: Four times a day (QID) | INTRAMUSCULAR | Status: DC | PRN
  Administered 2018-09-06: 10 mg via INTRAVENOUS
  Filled 2018-09-05 (×2): qty 1

## 2018-09-05 MED ORDER — INSULIN NPH (HUMAN) (ISOPHANE) 100 UNIT/ML ~~LOC~~ SUSP
25.0000 [IU] | Freq: Every day | SUBCUTANEOUS | Status: DC
  Administered 2018-09-06: 25 [IU] via SUBCUTANEOUS
  Filled 2018-09-05: qty 10

## 2018-09-05 MED ORDER — SENNOSIDES-DOCUSATE SODIUM 8.6-50 MG PO TABS
1.0000 | ORAL_TABLET | Freq: Two times a day (BID) | ORAL | Status: DC
  Administered 2018-09-05 – 2018-09-06 (×3): 1 via ORAL
  Filled 2018-09-05 (×3): qty 1

## 2018-09-05 NOTE — Progress Notes (Signed)
[facility] Progress Note  Patient: Misty Edwards WEX:[id number]   PCP: Susy Frizzle, MD DOB: 1926-09-30   DOA: 09/03/2018   DOS: 09/05/2018   Date of Service: the patient was seen and examined on 09/05/2018  Brief hospital course: Pt. with PMH of chronic pancreatitis, HTN, HLD, IBS; admitted on 09/03/2018, presented with complaint of fall, was found to have acute kidney injury, probably orthostatic hypotension versus syncope. Currently further plan is continue further work-up.  Subjective: Reports constipation.  Also reports intermittent nausea.  No vomiting.  Not passing gas.  No abdominal pain.  No fever no chills.  Able to tolerate breakfast.  Assessment and Plan: 1. Herpes zoster Left face. No ophthalmic involvement. Patient has extensive involvement of the 5th cranial nerve.  Reportedly patient does not have any dendritic ulcers or any corneal involvement per family. Patient was started on oral Valtrex on 09/01/2018 1 g 3 times daily. While she does not have any corneal involvement she continues to have severe pain around her ear and have difficulty opening her mouth. We will continue to monitor closely. Continue airborne isolation. With her worsening creatinine clearance her dose would be 1 g daily. Monitor for now.  2.  Acute kidney injury. Hyponatremia Baseline serum creatinine 0.85. Current serum creatinine 1.50 with BUN 26. Her sodium is also relatively on the lower side. Family reports poor p.o. intake at home. Likely secondary to zoster. Renal function improving with IV hydration.  Will reduce the fluid. Monitor renal function. Medications are renally adjusted at present per pharmacy guidance. Creatinine clearance 37  3.  Accelerated hypertension. Blood pressure in 180s. Providing IV hydralazine for now. Continue home regimen for blood pressure.  4.  Syncopal event with collapse. Patient actually had a syncopal event as well as collapse. CT scan of  the head unremarkable.  No focal deficit on examination. Suspect this is secondary to pain versus pain medication. Also poor p.o. intake and dehydration and orthostatic could be possibility. PT OT will be consulted. Monitor on telemetry. Echocardiogram ordered.  5.  Right acetabular fracture. Orthopedic consulted. Recommend weightbearing as tolerated. Patient lives alone and has poor safety as she was not able to get up on her own and her help arrived 4 hours later.  We will monitor PT OT recommendation. Appreciate orthopedic input. Patient is refusing IV morphine medication as she thinks that they make her hallucinate. Clearly patient is unable to tolerate oral narcotics as well as oxycodone prescribed for herpes zoster is probably the reason why she had a fall.  6.  Chronic pancreatitis. Constipation. Pain control. Continue home regimen. Increase bowel regimen.  Nausea likely secondary to constipation.  Monitor.  7.  Acute thrombocytopenia. Platelet 119.  Now 124 today Etiology not clear. We will continue to monitor for now.  8. Body mass index is 24.45 kg/m.  Nutrition Problem: Severe Malnutrition Etiology: acute illness  Continue dietary supplement per nutrition.  9.  Type 2 diabetes mellitus, uncontrolled with hyperglycemia, insulin-dependent, with vascular complication. CAD S/P CABG Continue aspirin. Pending hemoglobin A1c. Continue insulin NPH at a reduced dose of 15 mg twice daily given patient's poor p.o. intake. Continue sensitive sliding scale insulin.  Diet: Carb modified diet DVT Prophylaxis: Subcutaneous Lovenox  Advance goals of care discussion: full code  Family Communication: no family was present at bedside, at the time of interview.  Disposition:  Discharge to SNF .  Consultants: none Procedures: none  Scheduled Meds: . amLODipine  5 mg Oral Daily  .  aspirin EC  81 mg Oral Daily  . brimonidine  1 drop Both Eyes BID   And  . timolol  1  drop Both Eyes BID  . cycloSPORINE  1 drop Both Eyes BID  . feeding supplement (ENSURE ENLIVE)  237 mL Oral BID BM  . ferrous sulfate  325 mg Oral Q breakfast  . FLUoxetine  20 mg Oral Daily  . heparin  5,000 Units Subcutaneous Q8H  . insulin aspart  0-15 Units Subcutaneous TID WC  . insulin aspart  0-5 Units Subcutaneous QHS  . insulin NPH Human  15 Units Subcutaneous QAC supper  . [START ON 09/06/2018] insulin NPH Human  25 Units Subcutaneous QAC breakfast  . lipase/protease/amylase  24,000 Units Oral TID AC  . metoprolol tartrate  25 mg Oral BID  . pantoprazole  40 mg Oral Daily  . polyethylene glycol  17 g Oral Daily  . senna-docusate  1 tablet Oral BID  . sodium chloride flush  3 mL Intravenous Q12H  . valACYclovir  1,000 mg Oral BID   Continuous Infusions: PRN Meds: acetaminophen **OR** acetaminophen, alum & mag hydroxide-simeth, fentaNYL (SUBLIMAZE) injection, LORazepam, ondansetron **OR** ondansetron (ZOFRAN) IV, oxyCODONE Antibiotics: Anti-infectives (From admission, onward)   Start     Dose/Rate Route Frequency Ordered Stop   09/05/18 2200  valACYclovir (VALTREX) tablet 1,000 mg     1,000 mg Oral 2 times daily 09/05/18 1236     09/04/18 1000  valACYclovir (VALTREX) tablet 1,000 mg  Status:  Discontinued     1,000 mg Oral Daily 09/03/18 1554 09/05/18 1236   09/03/18 1700  valACYclovir (VALTREX) tablet 1,000 mg     1,000 mg Oral Once 09/03/18 1547 09/03/18 1718   09/03/18 1600  valACYclovir (VALTREX) tablet 1,000 mg  Status:  Discontinued     1,000 mg Oral 3 times daily 09/03/18 1254 09/03/18 1554       Objective: Physical Exam: Vitals:   09/04/18 2117 09/05/18 0627 09/05/18 0629 09/05/18 1532  BP: (!) 176/61  (!) 159/54 (!) 171/58  Pulse: 76  70 69  Resp: 18  15 16   Temp: 99.2 F (37.3 C)  98.2 F (36.8 C) (!) 97.4 F (36.3 C)  TempSrc: Oral  Oral Oral  SpO2: 96%  95% 96%  Weight:  64.2 kg    Height:        Intake/Output Summary (Last 24 hours) at 09/05/2018  1726 Last data filed at 09/05/2018 1302 Gross per 24 hour  Intake 483 ml  Output 1900 ml  Net -1417 ml   Filed Weights   09/03/18 0442 09/04/18 0500 09/05/18 0627  Weight: 62.6 kg 64.2 kg 64.2 kg   General: alert and oriented to time, place, and person. Appear in moderate distress, affect appropriate Eyes: PERRL, Conjunctiva normal ENT: Oral Mucosa Clear, moist  Neck: no JVD, no Abnormal Mass Or lumps Cardiovascular: S1 and S2 Present, no Murmur, peripheral pulses symmetrical Respiratory: normal respiratory effort, Bilateral Air entry equal and Decreased, no use of accessory muscle, Clear to Auscultation, no Crackles, no wheezes Abdomen: Bowel Sound present, Soft and no tenderness, no hernia Skin: Some yellowish discharge in the left epicanthal fold.  No new vesicle.  Oral vesicles are currently scabbed. Extremities: no Pedal edema, no calf tenderness Neurologic: normal without focal findings, mental status, speech normal, alert and oriented x3, PERLA, Motor strength 5/5 and symmetric and sensation grossly normal to light touch Gait not checked due to patient safety concerns  Data Reviewed: CBC: Recent Labs  Lab 09/03/18 0556 09/04/18 0814 09/05/18 0725  WBC 6.8 6.9 5.4  NEUTROABS 4.6  --   --   HGB 11.3* 10.5* 9.8*  HCT 33.2* 31.6* 30.6*  MCV 86.0 87.8 88.2  PLT 119* 124* 956*   Basic Metabolic Panel: Recent Labs  Lab 09/03/18 0556 09/04/18 0814 09/05/18 0725  NA 132* 130* 132*  K 4.9 4.1 3.8  CL 93* 96* 95*  CO2 25 25 26   GLUCOSE 172* 201* 135*  BUN 26* 16 17  CREATININE 1.50* 0.93 0.84  CALCIUM 9.2 8.6* 8.6*  MG  --  2.2  --   PHOS  --  3.1 3.7    Liver Function Tests: Recent Labs  Lab 09/04/18 0814 09/05/18 0725  ALBUMIN 3.8 3.2*   No results for input(s): LIPASE, AMYLASE in the last 168 hours. No results for input(s): AMMONIA in the last 168 hours. Coagulation Profile: No results for input(s): INR, PROTIME in the last 168 hours. Cardiac Enzymes:  Recent Labs  Lab 09/04/18 0814 09/05/18 0725  CKTOTAL 66 40   BNP (last 3 results) No results for input(s): PROBNP in the last 8760 hours. CBG: Recent Labs  Lab 09/04/18 2114 09/05/18 0623 09/05/18 0816 09/05/18 1110 09/05/18 1617  GLUCAP 284* 148* 149* 426* 171*   Studies: No results found.   Time spent: 35 minutes  Author: Berle Mull, MD [facility] 09/05/2018 5:26 PM  To reach On-call, see care teams to locate the attending and reach out to them via [email or web address]. If 7PM-7AM, please contact night-coverage If you still have difficulty reaching the attending provider, please page the [facility] (Director on Call) for [facility] on amion for assistance.

## 2018-09-05 NOTE — Progress Notes (Signed)
Patients BP 152/70 after given scheduled metoprolol. Order parameters not met to receive hydralazine at this time. Will continue to monitor patient.

## 2018-09-05 NOTE — Progress Notes (Signed)
Xcover Per RN bp 218/69 , currently on metoprolol, and amlodipine  A/P Hypertension uncontrolled Start hydralazine 10mg  iv q6h prn sbp >160

## 2018-09-05 NOTE — Progress Notes (Signed)
Notified Dr. Maudie Mercury BP 218/69. Patient given scheduled metoprolol. Advised by MD to wait an hour, recheck BP before giving PRN hydralazine. Will continue to monitor patient.

## 2018-09-06 ENCOUNTER — Inpatient Hospital Stay (HOSPITAL_COMMUNITY): Payer: Medicare HMO

## 2018-09-06 DIAGNOSIS — I340 Nonrheumatic mitral (valve) insufficiency: Secondary | ICD-10-CM

## 2018-09-06 DIAGNOSIS — I361 Nonrheumatic tricuspid (valve) insufficiency: Secondary | ICD-10-CM

## 2018-09-06 LAB — ECHOCARDIOGRAM COMPLETE
Height: 63 in
Weight: 2222.24 oz

## 2018-09-06 LAB — GLUCOSE, CAPILLARY
Glucose-Capillary: 261 mg/dL — ABNORMAL HIGH (ref 70–99)
Glucose-Capillary: 258 mg/dL — ABNORMAL HIGH (ref 70–99)
Glucose-Capillary: 189 mg/dL — ABNORMAL HIGH (ref 70–99)

## 2018-09-06 LAB — CBC
HCT: 36.9 % (ref 36.0–46.0)
Hemoglobin: 12.2 g/dL (ref 12.0–15.0)
MCH: 28.7 pg (ref 26.0–34.0)
MCHC: 33.1 g/dL (ref 30.0–36.0)
MCV: 86.8 fL (ref 80.0–100.0)
Platelets: 174 10*3/uL (ref 150–400)
RBC: 4.25 MIL/uL (ref 3.87–5.11)
RDW: 12.9 % (ref 11.5–15.5)
WBC: 6.6 10*3/uL (ref 4.0–10.5)
nRBC: 0 % (ref 0.0–0.2)

## 2018-09-06 LAB — RENAL FUNCTION PANEL
Albumin: 3.8 g/dL (ref 3.5–5.0)
Anion gap: 13 (ref 5–15)
BUN: 15 mg/dL (ref 8–23)
CO2: 26 mmol/L (ref 22–32)
Calcium: 9.1 mg/dL (ref 8.9–10.3)
Chloride: 91 mmol/L — ABNORMAL LOW (ref 98–111)
Creatinine, Ser: 0.76 mg/dL (ref 0.44–1.00)
GFR calc Af Amer: >60 mL/min (ref >60)
GFR calc non Af Amer: >60 mL/min (ref >60)
Glucose, Bld: 220 mg/dL — ABNORMAL HIGH (ref 70–99)
Phosphorus: 3.8 mg/dL (ref 2.5–4.6)
Potassium: 3.8 mmol/L (ref 3.5–5.1)
Sodium: 130 mmol/L — ABNORMAL LOW (ref 135–145)

## 2018-09-06 MED ORDER — LISINOPRIL 5 MG PO TABS
2.5000 mg | ORAL_TABLET | Freq: Every day | ORAL | Status: DC
  Administered 2018-09-06: 2.5 mg via ORAL
  Filled 2018-09-06: qty 1

## 2018-09-06 MED ORDER — ENSURE ENLIVE PO LIQD: 237.0000 mL | Freq: Two times a day (BID) | ORAL | 12 refills | Status: DC

## 2018-09-06 MED ORDER — POLYETHYLENE GLYCOL 3350 17 G PO PACK: 17.0000 g | PACK | Freq: Every day | ORAL | 0 refills | Status: DC

## 2018-09-06 MED ORDER — VALACYCLOVIR HCL 1 G PO TABS: 1000.0000 mg | ORAL_TABLET | Freq: Two times a day (BID) | ORAL | 0 refills | Status: AC

## 2018-09-06 MED ORDER — HYDRALAZINE HCL 25 MG PO TABS
25.0000 mg | ORAL_TABLET | Freq: Three times a day (TID) | ORAL | Status: DC
  Administered 2018-09-06 (×2): 25 mg via ORAL
  Filled 2018-09-06 (×2): qty 1

## 2018-09-06 NOTE — Progress Notes (Signed)
Pt IV removed, WNL. D/C instructions given to pt. Verbalized understanding. Pt daughter to transport patient home.

## 2018-09-06 NOTE — Progress Notes (Signed)
Physical Therapy Treatment Patient Details Name: Misty Edwards MRN: [id number] DOB: 05/19/1926 Today's Date: 09/06/2018    History of Present Illness Patient with right hip and groin pain after suffering a fall last evening about 8 PM.  States she was trying to go from her bedroom to the bathroom when she lost her balance and fell onto her right side.  Believes that she hit her head but was not knocked out.  Complains of pain to her right groin and hip area.  She believes that she is dehydrated because she has not been eating and drinking much due to her recurrent shingles infection of her forehead and eye.  She has had this for about the past week and saw her PCP as well as the ophthalmologist and is currently being treated with p.o. and topical medications.  She is also taking oxycodone for pain.  Patient states she laid on the ground for several hours before she was able to get back to bed.  She denies any preceding dizziness or lightheadedness.  No chest pain or shortness of breath.  No abdominal pain, nausea or vomiting.  No focal weakness, numbness or tingling.  She does not think that she passed out.    PT Comments    Patient demonstrates much improvement for moving RLE during bed mobility, required verbal cues for propping up on elbows to hands for supine to sitting with fair/good carryover, increased endurance/distance for gait training with increased tolerance for weightbearing on RLE and no loss of balance.  Patient required verbal cues for proper hand placement during sit to stands, stands to sit with good carryover and tolerated sitting up in chair after therapy.  Patient will benefit from continued physical therapy in hospital and recommended venue below to increase strength, balance, endurance for safe ADLs and gait.    Follow Up Recommendations  SNF;Supervision for mobility/OOB;Supervision - Intermittent     Equipment Recommendations  None recommended by PT    Recommendations  for Other Services       Precautions / Restrictions Precautions Precautions: Fall Precaution Comments: right acetabular fracture Restrictions Weight Bearing Restrictions: Yes RLE Weight Bearing: Weight bearing as tolerated    Mobility  Bed Mobility Overal bed mobility: Needs Assistance Bed Mobility: Supine to Sit     Supine to sit: Min guard     General bed mobility comments: slow labored movement with fair/good return for propping up on elbows to hands and able to move RLE with minor increase in pain  Transfers Overall transfer level: Needs assistance Equipment used: Rolling walker (2 wheeled) Transfers: Sit to/from [facility] Sit to Stand: Supervision;Min guard Stand pivot transfers: Supervision;Min guard       General transfer comment: required verbal cues for proper hand placement during sit to stands with fair/good carryover demonstrated  Ambulation/Gait Ambulation/Gait assistance: Supervision;Min guard Gait Distance (Feet): 35 Feet Assistive device: Rolling walker (2 wheeled) Gait Pattern/deviations: Decreased step length - right;Decreased stance time - right;Decreased stride length Gait velocity: decreased   General Gait Details: increased endurance/distance for ambulation with much improvement for tolerating weightbearing on RLE without c/o increased pain, no loss of balance, limited secondary to c/o fatigue   Stairs             Wheelchair Mobility    Modified Rankin (Stroke Patients Only)       Balance Overall balance assessment: Needs assistance Sitting-balance support: Feet supported;No upper extremity supported Sitting balance-Leahy Scale: Good     Standing balance support:  During functional activity;Bilateral upper extremity supported Standing balance-Leahy Scale: Fair Standing balance comment: using RW                            Cognition Arousal/Alertness: Awake/alert Behavior During Therapy: WFL for  tasks assessed/performed Overall Cognitive Status: Within Functional Limits for tasks assessed                                        Exercises Total Joint Exercises Ankle Circles/Pumps: AROM;Supine;Strengthening;10 reps;Both Gluteal Sets: Supine;10 reps;AROM;Strengthening;Both Short Arc Quad: Supine;10 reps;Strengthening;AROM;Right Heel Slides: Supine;10 reps;AROM;Strengthening;Right    General Comments        Pertinent Vitals/Pain Pain Assessment: Faces Faces Pain Scale: Hurts a little bit Pain Location: right hip with movement and weightbearing Pain Descriptors / Indicators: Sore;Discomfort Pain Intervention(s): Limited activity within patient's tolerance;Monitored during session    Home Living                      Prior Function            PT Goals (current goals can now be found in the care plan section) Acute Rehab PT Goals Patient Stated Goal: return home with family to assist PT Goal Formulation: With patient Time For Goal Achievement: 09/17/18 Potential to Achieve Goals: Good Progress towards PT goals: Progressing toward goals    Frequency    Min 4X/week      PT Plan Current plan remains appropriate    Co-evaluation              AM-PAC PT "6 Clicks" Mobility   Outcome Measure  Help needed turning from your back to your side while in a flat bed without using bedrails?: A Little Help needed moving from lying on your back to sitting on the side of a flat bed without using bedrails?: A Little Help needed moving to and from a bed to a chair (including a wheelchair)?: A Little Help needed standing up from a chair using your arms (e.g., wheelchair or bedside chair)?: A Little Help needed to walk in hospital room?: A Little Help needed climbing 3-5 steps with a railing? : A Lot 6 Click Score: 17    End of Session   Activity Tolerance: Patient tolerated treatment well;Patient limited by fatigue Patient left: in chair;with  call bell/phone within reach Nurse Communication: Mobility status PT Visit Diagnosis: Unsteadiness on feet (R26.81);Other abnormalities of gait and mobility (R26.89);Muscle weakness (generalized) (M62.81)     Time: 3428-7681 PT Time Calculation (min) (ACUTE ONLY): 27 min  Charges:  $Therapeutic Exercise: 8-22 mins $Therapeutic Activity: 8-22 mins                     3:06 PM, 09/06/18 Lonell Grandchild, MPT Physical Therapist with [facility] 336 [phone] office [phone] mobile phone

## 2018-09-06 NOTE — Evaluation (Signed)
Occupational Therapy Evaluation Patient Details Name: Misty Edwards MRN: [id number] DOB: 12-20-1926 Today's Date: 09/06/2018    History of Present Illness Patient with right hip and groin pain after suffering a fall last evening about 8 PM.  States she was trying to go from her bedroom to the bathroom when she lost her balance and fell onto her right side.  Believes that she hit her head but was not knocked out.  Complains of pain to her right groin and hip area.  She believes that she is dehydrated because she has not been eating and drinking much due to her recurrent shingles infection of her forehead and eye.  She has had this for about the past week and saw her PCP as well as the ophthalmologist and is currently being treated with p.o. and topical medications.  She is also taking oxycodone for pain.  Patient states she laid on the ground for several hours before she was able to get back to bed.  She denies any preceding dizziness or lightheadedness.  No chest pain or shortness of breath.  No abdominal pain, nausea or vomiting.  No focal weakness, numbness or tingling.  She does not think that she passed out.   Clinical Impression   Patient in bed upon therapy arrival and agreeable to participate in OT evaluation. Patient reports that she is discharging to her Daughter's home instead of a SNF. Daughter is able to provide assistance as needed in addition to help from friends and family when daughter is not available. Patient will require assistance with LB bathing and dressing mainly. She could complete functional transfers with Min guard and VC for technique while using walker. Pt was informed of use of long handled reacher to assist with LB dressing if needed. At this time, with daughter providing assistance I do not see any need for OT services at this time. Patient is happy with the additional assistance. She has all the necessary equipment. Will sign off.     Follow Up Recommendations  No OT  follow up    Equipment Recommendations  None recommended by OT;Other (comment)(Informed patient regarding use of long handled reacher)       Precautions / Restrictions Precautions Precautions: Fall Precaution Comments: right acetabular fracture Restrictions Weight Bearing Restrictions: Yes RLE Weight Bearing: Weight bearing as tolerated      Mobility Bed Mobility Overal bed mobility: Needs Assistance Bed Mobility: Supine to Sit     Supine to sit: Supervision;HOB elevated     General bed mobility comments: Slow movement although able to complete with use of bed rails  Transfers Overall transfer level: Needs assistance Equipment used: Rolling walker (2 wheeled) Transfers: Sit to/from [facility] Sit to Stand: Min guard Stand pivot transfers: Min guard       General transfer comment: VC for technique and safety when completing a sit to stand.    Balance Overall balance assessment: Needs assistance Sitting-balance support: Feet supported;No upper extremity supported Sitting balance-Leahy Scale: Good Sitting balance - Comments: seated at bedside   Standing balance support: During functional activity;Bilateral upper extremity supported Standing balance-Leahy Scale: Fair Standing balance comment: using RW                           ADL either performed or assessed with clinical judgement   ADL Overall ADL's : Needs assistance/impaired                 Upper  Body Dressing : Minimal assistance;Sitting   Lower Body Dressing: Total assistance;Sit to/from stand Lower Body Dressing Details (indicate cue type and reason): Pt reports that she does not wear socks at home. She only wears slip on shoes or house slippers. Toilet Transfer: Chief of Staff Details (indicate cue type and reason): Simulated to recliner from bed Toileting- Clothing Manipulation and Hygiene: Total assistance;Sit to/from stand Toileting -  Clothing Manipulation Details (indicate cue type and reason): due to incontinent episode of BM             Vision Baseline Vision/History: Wears glasses Wears Glasses: Reading only Patient Visual Report: Other (comment)(patient is legally blind)              Pertinent Vitals/Pain Pain Assessment: No/denies pain Pain Score: 0-No pain Faces Pain Scale: Hurts a little bit Pain Location: right hip with movement and weightbearing Pain Descriptors / Indicators: Sore;Discomfort Pain Intervention(s): Limited activity within patient's tolerance;Monitored during session     Hand Dominance Right   Extremity/Trunk Assessment Upper Extremity Assessment Upper Extremity Assessment: Overall WFL for tasks assessed   Lower Extremity Assessment Lower Extremity Assessment: Defer to PT evaluation       Communication Communication Communication: No difficulties   Cognition Arousal/Alertness: Awake/alert Behavior During Therapy: WFL for tasks assessed/performed Overall Cognitive Status: Within Functional Limits for tasks assessed                  Home Living Family/patient expects to be discharged to:: Private residence(Daughter is taking patient home with her versus a SNF) Living Arrangements: Alone Available Help at Discharge: Family;Available 24 hours/day Type of Home: House(Daughter's house:) Home Access: Level entry     Home Layout: One level     Bathroom Shower/Tub: Teacher, early years/pre: Handicapped height Bathroom Accessibility: Yes   Home Equipment: Walker - 2 wheels;Cane - single point;Bedside commode;Shower seat;Grab bars - tub/shower          Prior Functioning/Environment Level of Independence: Needs assistance  Gait / Transfers Assistance Needed: household ambulator with RW PRN ADL's / Homemaking Assistance Needed: assisted by family            OT Problem List: Impaired balance (sitting and/or standing);Decreased activity  tolerance;Decreased strength         OT Goals(Current goals can be found in the care plan section) Acute Rehab OT Goals Patient Stated Goal: go to daughter's home OT Goal Formulation: All assessment and education complete, DC therapy      AM-PAC OT "6 Clicks" Daily Activity     Outcome Measure Help from another person eating meals?: None Help from another person taking care of personal grooming?: A Little Help from another person toileting, which includes using toliet, bedpan, or urinal?: Total Help from another person bathing (including washing, rinsing, drying)?: Total Help from another person to put on and taking off regular upper body clothing?: None Help from another person to put on and taking off regular lower body clothing?: A Lot 6 Click Score: 15   End of Session Equipment Utilized During Treatment: Rolling walker Nurse Communication: Other (comment)(Informed NT of BM accident and PeriWick removal)  Activity Tolerance: Patient tolerated treatment well Patient left: in chair;with call bell/phone within reach  OT Visit Diagnosis: Muscle weakness (generalized) (M62.81)                Time: 4944-9675 OT Time Calculation (min): 38 min Charges:  OT General Charges $OT Visit: 1 Visit OT Evaluation $OT Eval  Low Complexity: 1 Low  Ailene Ravel, OTR/L,CBIS  [phone]   Debby Bud 09/06/2018, 4:46 PM

## 2018-09-06 NOTE — TOC Initial Note (Addendum)
Transition of Care [facility]) - Initial/Assessment Note    Patient Details  Name: Misty Edwards MRN: [id number] Date of Birth: [birth year]  [facility] CM/SW Contact:    Trish Mage, LCSW Phone Number: 09/06/2018, 1:36 PM  Clinical Narrative:   Misty Edwards is an 85 YO female who comes to [place] following a fall at home, resulting in a hip fracture.  She deferred to her daughter for further planning, saying she trusts her implicitly and is reluctant to go to SNF due to COVID and isolation from family.  Spoke with daughter Misty Edwards, who confirmed plan of having patient come there post d/c, where there will be someone in attendance of  Pt 24/7 during convalescence.  Misty Edwards states that patient had been living independently and doing fine until recently, when she had been started on opiates for pain.  Misty Edwards became more involved, spending time with her mother daily, and encouraging her to slow down and take it easy, as well as encouraging her to make sure she was using her walker whenever ambulating.  Apparently the fall occurred when patient was not using walker.  Besides walker, Pt also had bedside commode and lift chair, which will both be brought to daughters' home.    Daughter chooses [facility] for PT and nursing, as well as for nurse aid to help with bathing.  Daughter requests that her mother get PT again today so she can be apprised of progress since last Friday.        Expected Discharge Plan: [place] Barriers to Discharge: No Barriers Identified   Patient Goals and CMS Choice Patient states their goals for this hospitalization and ongoing recovery are:: "My daughter wants me to stay with her so she can take care of me when i leave."      Expected Discharge Plan and Services Expected Discharge Plan: [place] In-house Referral: PCP / [facility] Discharge Planning Services: CM Consult Post Acute Care Choice: [place] arrangements for the past  2 months: Mobile Home Expected Discharge Date: 09/07/18                         HH Arranged: PT, Nurse's Aide, RN [place] Agency: [place] ([facility]) Date HH Agency Contacted: 09/06/18 Time [place]: 1328 Representative spoke with at [place]: Vaughan Basta  Prior Living Arrangements/Services Living arrangements for the past 2 months: Mobile Home Lives with:: Self Patient language and need for interpreter reviewed:: Yes Do you feel safe going back to the place where you live?: No   States she will stay with daughter  Need for Family Participation in Patient Care: Yes (Comment) Care giver support system in place?: Yes (comment) Current home services: DME Criminal Activity/Legal Involvement Pertinent to Current Situation/Hospitalization: No - Comment as needed  Activities of Daily Living Home Assistive Devices/Equipment: Gilford Rile (specify type) ADL Screening (condition at time of admission) Patient's cognitive ability adequate to safely complete daily activities?: Yes Is the patient deaf or have difficulty hearing?: Yes Does the patient have difficulty seeing, even when wearing glasses/contacts?: Yes(Legaly blind) Does the patient have difficulty concentrating, remembering, or making decisions?: No Patient able to express need for assistance with ADLs?: Yes Does the patient have difficulty dressing or bathing?: No Independently performs ADLs?: Yes (appropriate for developmental age) Does the patient have difficulty walking or climbing stairs?: Yes Weakness of Legs: Both Weakness of Arms/Hands: Both  Permission Sought/Granted Permission sought  to share information with : Family Supports Permission granted to share information with : Yes, Verbal Permission Granted  Share Information with NAME: [name]     Permission granted to share info w Relationship: daughter  Permission granted to share info w Contact Information: [phone]  Emotional  Assessment Appearance:: Appears stated age Attitude/Demeanor/Rapport: Engaged Affect (typically observed): Appropriate Orientation: : Oriented to Self, Oriented to Place, Oriented to  Time, Oriented to Situation Alcohol / Substance Use: Not Applicable Psych Involvement: No (comment)  Admission diagnosis:  Fall [W19.XXXA] Closed nondisplaced fracture of anterior wall of right acetabulum, initial encounter ([place]) [S32.414A] Herpes zoster ophthalmicus of left eye [B02.30] Patient Active Problem List   Diagnosis Date Noted  . Herpes zoster ophthalmicus 09/03/2018  . Right acetabular fracture ([place]) 09/03/2018  . Syncope and collapse 09/03/2018  . Protein-calorie malnutrition, severe 09/03/2018  . Vaginal atrophy 04/29/2018  . Angiokeratoma 04/29/2018  . PMB (postmenopausal bleeding) 04/29/2018  . Acute on chronic diastolic CHF (congestive heart failure) ([place]) 10/23/2016  . Acute respiratory failure with hypoxia ([place]) 10/23/2016  . Nephrolithiasis 09/27/2016  . Hydronephrosis with renal and ureteral calculus obstruction 09/27/2016  . Hydronephrosis 09/27/2016  . Heme positive stool 09/05/2016  . Dyslipidemia 03/07/2015  . Type 2 diabetes mellitus with vascular disease ([place]) 03/07/2015  . Anemia 09/26/2014  . Lesion of lip 09/25/2014  . S/P CABG x 3 08/25/2014  . NSTEMI (non-ST elevated myocardial infarction) ([place]) 08/24/2014  . Acute coronary syndrome ([place])   . IBS (irritable bowel syndrome) 08/26/2012  . Thrombocytopenia ([place]) 02/02/2012  . Epigastric pain 02/02/2012  . Gastritis 02/02/2012  . Abdominal pain 12/17/2011  . Normocytic anemia 12/17/2011  . History of colon polyps 12/17/2011  . Constipation 12/17/2011  . Family history of colon cancer 08/22/2011  . LLQ pain 08/21/2011  . Chronic pancreatitis ([place]) 09/12/2008  . HELICOBACTER PYLORI INFECTION 01/26/2008  . ADENOCARCINOMA, BREAST 01/26/2008  . Essential hypertension 01/26/2008  . External hemorrhoids 01/26/2008  .  GERD 01/26/2008  . PSEUDOCYST, PANCREAS 01/26/2008  . GALLSTONE PANCREATITIS 01/26/2008  . GI BLEEDING 01/26/2008  . UTI 01/26/2008  . NAUSEA 01/26/2008  . VOMITING 01/26/2008  . DIARRHEA 01/26/2008   PCP:  Susy Frizzle, MD Pharmacy:   [facility] [address], [place] - [place] [address] [place] [address] [place] [address] Phone: [phone] Fax: [phone]  [facility] [place], [place] [address] Phone: [phone] Fax: [place], [place] [address] Phone: [phone] Fax: [phone]     Social Determinants of Health (SDOH) Interventions    Readmission Risk Interventions No flowsheet data found.

## 2018-09-06 NOTE — Progress Notes (Signed)
*  PRELIMINARY RESULTS* Echocardiogram 2D Echocardiogram has been performed.  Misty Edwards 09/06/2018, 3:03 PM

## 2018-09-06 NOTE — Care Management Important Message (Signed)
Important Message  Patient Details  Name: Misty Edwards MRN: [id number] Date of Birth: 1927/01/11   Medicare Important Message Given:  Yes     [name] 09/06/2018, 11:52 AM

## 2018-09-08 ENCOUNTER — Other Ambulatory Visit: Payer: Self-pay

## 2018-09-08 ENCOUNTER — Inpatient Hospital Stay (HOSPITAL_COMMUNITY)
Admission: EM | Admit: 2018-09-08 | Discharge: 2018-09-23 | DRG: 682 | Disposition: A | Discharge status: Skilled Nursing Facility | Payer: Medicare HMO | Attending: Internal Medicine | Admitting: Internal Medicine

## 2018-09-08 ENCOUNTER — Encounter (HOSPITAL_COMMUNITY): Payer: Self-pay | Admitting: Emergency Medicine

## 2018-09-08 ENCOUNTER — Telehealth: Payer: Self-pay | Admitting: Family Medicine

## 2018-09-08 DIAGNOSIS — Z8249 Family history of ischemic heart disease and other diseases of the circulatory system: Secondary | ICD-10-CM

## 2018-09-08 DIAGNOSIS — K861 Other chronic pancreatitis: Secondary | ICD-10-CM | POA: Diagnosis present

## 2018-09-08 DIAGNOSIS — E785 Hyperlipidemia, unspecified: Secondary | ICD-10-CM | POA: Diagnosis present

## 2018-09-08 DIAGNOSIS — R4182 Altered mental status, unspecified: Secondary | ICD-10-CM | POA: Diagnosis not present

## 2018-09-08 DIAGNOSIS — Z6824 Body mass index (BMI) 24.0-24.9, adult: Secondary | ICD-10-CM

## 2018-09-08 DIAGNOSIS — E1159 Type 2 diabetes mellitus with other circulatory complications: Secondary | ICD-10-CM | POA: Diagnosis present

## 2018-09-08 DIAGNOSIS — R410 Disorientation, unspecified: Secondary | ICD-10-CM | POA: Diagnosis not present

## 2018-09-08 DIAGNOSIS — Z885 Allergy status to narcotic agent status: Secondary | ICD-10-CM

## 2018-09-08 DIAGNOSIS — N390 Urinary tract infection, site not specified: Secondary | ICD-10-CM | POA: Diagnosis present

## 2018-09-08 DIAGNOSIS — F419 Anxiety disorder, unspecified: Secondary | ICD-10-CM | POA: Diagnosis present

## 2018-09-08 DIAGNOSIS — Z888 Allergy status to other drugs, medicaments and biological substances status: Secondary | ICD-10-CM

## 2018-09-08 DIAGNOSIS — Z515 Encounter for palliative care: Secondary | ICD-10-CM

## 2018-09-08 DIAGNOSIS — Z951 Presence of aortocoronary bypass graft: Secondary | ICD-10-CM

## 2018-09-08 DIAGNOSIS — R140 Abdominal distension (gaseous): Secondary | ICD-10-CM

## 2018-09-08 DIAGNOSIS — I10 Essential (primary) hypertension: Secondary | ICD-10-CM | POA: Diagnosis present

## 2018-09-08 DIAGNOSIS — N179 Acute kidney failure, unspecified: Principal | ICD-10-CM

## 2018-09-08 DIAGNOSIS — I959 Hypotension, unspecified: Secondary | ICD-10-CM | POA: Diagnosis not present

## 2018-09-08 DIAGNOSIS — B9689 Other specified bacterial agents as the cause of diseases classified elsewhere: Secondary | ICD-10-CM | POA: Diagnosis present

## 2018-09-08 DIAGNOSIS — E43 Unspecified severe protein-calorie malnutrition: Secondary | ICD-10-CM | POA: Diagnosis present

## 2018-09-08 DIAGNOSIS — E1165 Type 2 diabetes mellitus with hyperglycemia: Secondary | ICD-10-CM | POA: Diagnosis present

## 2018-09-08 DIAGNOSIS — Z79899 Other long term (current) drug therapy: Secondary | ICD-10-CM

## 2018-09-08 DIAGNOSIS — Z7189 Other specified counseling: Secondary | ICD-10-CM

## 2018-09-08 DIAGNOSIS — I491 Atrial premature depolarization: Secondary | ICD-10-CM | POA: Diagnosis not present

## 2018-09-08 DIAGNOSIS — K5900 Constipation, unspecified: Secondary | ICD-10-CM

## 2018-09-08 DIAGNOSIS — N12 Tubulo-interstitial nephritis, not specified as acute or chronic: Secondary | ICD-10-CM

## 2018-09-08 DIAGNOSIS — R52 Pain, unspecified: Secondary | ICD-10-CM | POA: Diagnosis not present

## 2018-09-08 DIAGNOSIS — R0902 Hypoxemia: Secondary | ICD-10-CM | POA: Diagnosis not present

## 2018-09-08 DIAGNOSIS — H548 Legal blindness, as defined in USA: Secondary | ICD-10-CM | POA: Diagnosis present

## 2018-09-08 DIAGNOSIS — K219 Gastro-esophageal reflux disease without esophagitis: Secondary | ICD-10-CM | POA: Diagnosis present

## 2018-09-08 DIAGNOSIS — Z03818 Encounter for observation for suspected exposure to other biological agents ruled out: Secondary | ICD-10-CM | POA: Diagnosis not present

## 2018-09-08 DIAGNOSIS — M1990 Unspecified osteoarthritis, unspecified site: Secondary | ICD-10-CM | POA: Diagnosis present

## 2018-09-08 DIAGNOSIS — E860 Dehydration: Secondary | ICD-10-CM | POA: Diagnosis present

## 2018-09-08 DIAGNOSIS — Z7982 Long term (current) use of aspirin: Secondary | ICD-10-CM

## 2018-09-08 DIAGNOSIS — I2510 Atherosclerotic heart disease of native coronary artery without angina pectoris: Secondary | ICD-10-CM | POA: Diagnosis present

## 2018-09-08 DIAGNOSIS — E871 Hypo-osmolality and hyponatremia: Secondary | ICD-10-CM | POA: Diagnosis present

## 2018-09-08 DIAGNOSIS — Z1159 Encounter for screening for other viral diseases: Secondary | ICD-10-CM

## 2018-09-08 DIAGNOSIS — J449 Chronic obstructive pulmonary disease, unspecified: Secondary | ICD-10-CM | POA: Diagnosis not present

## 2018-09-08 DIAGNOSIS — Z794 Long term (current) use of insulin: Secondary | ICD-10-CM

## 2018-09-08 DIAGNOSIS — K581 Irritable bowel syndrome with constipation: Secondary | ICD-10-CM | POA: Diagnosis present

## 2018-09-08 DIAGNOSIS — Z86000 Personal history of in-situ neoplasm of breast: Secondary | ICD-10-CM

## 2018-09-08 LAB — CBC
HCT: 35.4 % — ABNORMAL LOW (ref 36.0–46.0)
Hemoglobin: 11.8 g/dL — ABNORMAL LOW (ref 12.0–15.0)
MCH: 28.6 pg (ref 26.0–34.0)
MCHC: 33.3 g/dL (ref 30.0–36.0)
MCV: 85.7 fL (ref 80.0–100.0)
Platelets: 166 10*3/uL (ref 150–400)
RBC: 4.13 MIL/uL (ref 3.87–5.11)
RDW: 13.4 % (ref 11.5–15.5)
WBC: 7.2 10*3/uL (ref 4.0–10.5)
nRBC: 0 % (ref 0.0–0.2)

## 2018-09-08 LAB — CBG MONITORING, ED: Glucose-Capillary: 253 mg/dL — ABNORMAL HIGH (ref 70–99)

## 2018-09-08 MED ORDER — SODIUM CHLORIDE 0.9% FLUSH
3.0000 mL | Freq: Once | INTRAVENOUS | Status: AC
  Administered 2018-09-08: 3 mL via INTRAVENOUS

## 2018-09-08 NOTE — Discharge Summary (Signed)
[facility] Discharge Summary   Patient: Misty Edwards [id number]:[id number]   PCP: Susy Frizzle, MD DOB: Dec 27, 1926   Date of admission: 09/03/2018   Date of discharge: 09/06/2018     Discharge Diagnoses:   Principal Problem:   Herpes zoster ophthalmicus Active Problems:   Essential hypertension   GERD   Chronic pancreatitis ([place])   Constipation   S/P CABG x 3   Type 2 diabetes mellitus with vascular disease ([place])   Right acetabular fracture ([place])   Syncope and collapse   Protein-calorie malnutrition, severe   Admitted From: home Disposition:  Home with home health  Recommendations for Outpatient Follow-up:  1. Please follow-up with PCP in 1 week  Follow-up Information    Susy Frizzle, MD. Schedule an appointment as soon as possible for a visit in 1 week(s).   Specialty: Family Medicine Contact information: [address] [place] [address] [place] [facility] up.   Why: Pt will be seen by visiting nurse from this agency within 1 week of d/c       Branch, Alphonse Guild, MD Follow up on 10/28/2018.   Specialty: Cardiology Why: 10:40AM Contact information: [place] [address] [phone]          Diet recommendation: Cardiac diet  Activity: The patient is advised to gradually reintroduce usual activities,as tolerated .  Discharge Condition: good  Code Status: Full code  History of present illness: As per the H and P dictated on admission, "Misty Edwards is a 85 y.o. female with Past medical history of chronic pancreatitis, HTN, HLD, IBS. Patient presents with complaint of fall.  Patient lives alone.  Recently was diagnosed with shingles and started on oral Valtrex 2 days ago.  She started having increasing pain in her left eye and was started on oral oxycodone for pain control.  She had difficulty taking p.o. food and liquids as she had severe intense pain associated with her  shingles. For last several days she has been doing poorly and mostly spending her time in bed. She went to see her ophthalmologist on 09/02/2018 and she was given medication for glaucoma.  Per daughter she was told that she does not have shingles in her eye. Patient was trying to go from her bed to the bathroom, blacked out and fell on the ground.  When she woke up she found herself on the ground and dragged herself back to the bed. This happens at around 8 PM on 09/02/2018.  Early in the morning of 09/03/2018 at 1 AM she needs to drink some water and is unable to get out of the bed and at that point she started to call her grandson and daughter who in turn come to help her around 5 and she was brought to the hospital. She reported some injury to her head.  No nausea no vomiting.  No dizziness no lightheadedness.  No chest pain.  Reports severe pain in her eye as well as severe pain in her left leg.  No diarrhea no constipation reported by the patient at present.  She took her first dose of pain medication on 09/02/2018.  ED Course: Work-up including CT head and C-spine were negative.  Patient had a CT pelvis that showed evidence of acetabular fracture on right.  Orthopedic was consulted and recommended nonoperative management with pain control.  Patient also had acute kidney injury, SBP in 170s, patient  was referred for admission. "  Hospital Course:  Summary of her active problems in the hospital is as following. 1.Herpes zoster Left face. No ophthalmic involvement. Patient has extensive involvement of the 5th cranial nerve. Reportedly patient does not have any dendritic ulcers or any corneal involvement per family. Patient was started on oral Valtrex on 6/17/20201 g 3 times daily. While she does not have any corneal involvement she continues to have severe improved as well. Around her ear and have difficulty opening her mouth. Patient was kept on anticoagulation and was continued on oral  Valtrex.  With improvement in rash her pain improved as well. Valtrex dose was adjusted to 1 g twice daily based on her renal function.  2.Acute kidney injury. Hyponatremia Baseline serum creatinine 0.85. On admission serum creatinine 1.50 with BUN 26. Her sodium is also relatively on the lower side. Family reports poor p.o. intake at home. Likely secondary to zoster. Renal function improving with IV hydration. Medications are renally adjusted at present per pharmacy guidance. Creatinine clearance 37  3.Accelerated hypertension. Blood pressure in 180s. Providing IV hydralazine for now. Continue home regimen for blood pressure.  4.Syncopal event with collapse. Patient actually had a syncopal event as well as collapse. CT scan of the head unremarkable. No focal deficit on examination. Suspect this is secondary to pain versus pain medication. Also poor p.o. intake and dehydration and orthostatic could be possibility. PT recommended SNF although family and the patient would like to go home.  Home with home health was arranged by case management. Echocardiogram showed no significant valvular abnormality and her EF preserved.  Telemetry examination was also unremarkable.  5.Right acetabular fracture. Orthopedic consulted. Recommend weightbearing as tolerated. Patient lives alone and has poor safety as she was not able to get up on her own and her help arrived 4 hours later.  Appreciate orthopedic input. Patient is refusing IV morphine medication as she thinks that they make her hallucinate. Clearly patient is unable to tolerate oral narcotics as well asoxycodone prescribed for herpes zosteris probably the reason why she had a fall.  6.Chronic pancreatitis. Constipation. Pain control. Continue home regimen. Increase bowel regimen.  Nausea likely secondary to constipation.  Monitor.  7.Acute thrombocytopenia. Platelet 119.  Now 124 today Etiology not clear. We  will continue to monitor for now.  8.Body mass index is 24.45 kg/m. Nutrition Problem: Severe Malnutrition Etiology: acute illness Continue dietary supplement per nutrition.  9.Type 2 diabetes mellitus, uncontrolled with hyperglycemia, insulin-dependent, with vascular complication. CAD S/P CABG Continue aspirin. Continue home regimen at the time of the discharge.  Pain control  - [employer] Controlled Substance Reporting System database was reviewed. - Patient was instructed, not to drive, operate heavy machinery, perform activities at heights, swimming or participation in water activities or provide baby sitting services while on Pain, Sleep and Anxiety Medications; until her outpatient Physician has advised to do so again.  - Also recommended to not to take more than prescribed Pain, Sleep and Anxiety Medications.  Patient was seen by physical therapy, who recommended Home health, which was arranged by Education officer, museum. On the day of the discharge the patient's  Vitals were stable, and no other acute medical condition were reported by patient. the patient was felt safe to be discharge at Home with home health.  Consultants: Orthopedics Procedures: Echocardiogram  DISCHARGE MEDICATION: Allergies as of 09/06/2018      Reactions   Calcium-containing Compounds Nausea Only   Morphine And Related Other (See Comments)  Hallucination   Raloxifene Itching   Evista- Face and eyes burning   Vitamin D Analogs Nausea Only      Medication List    STOP taking these medications   acetaminophen 500 MG tablet Commonly known as: TYLENOL   furosemide 20 MG tablet Commonly known as: LASIX   potassium chloride 10 MEQ tablet Commonly known as: K-DUR     TAKE these medications   amLODipine 5 MG tablet Commonly known as: NORVASC TAKE 1 TABLET BY MOUTH ONCE DAILY   aspirin 81 MG EC tablet Take 1 tablet (81 mg total) by mouth daily.   Benefiber Drink Mix Pack Take 1 Package  by mouth daily. Notes to patient: Resume home regimen.    Combigan 0.2-0.5 % ophthalmic solution Generic drug: brimonidine-timolol Place 1 drop into both eyes every 12 (twelve) hours. Notes to patient: Resume home regimen.    Creon 24000-76000 units Cpep Generic drug: Pancrelipase (Lip-Prot-Amyl) TAKE 2 CAPSULES BY MOUTH WITH MEALS AND 1 CAP WITH SNACKS x 2 What changed:   how much to take  how to take this  when to take this  additional instructions   cycloSPORINE 0.05 % ophthalmic emulsion Commonly known as: RESTASIS Place 1 drop into both eyes 3 (three) times daily as needed.   feeding supplement (ENSURE ENLIVE) Liqd Take 237 mLs by mouth 2 (two) times daily between meals.   ferrous sulfate 325 (65 FE) MG tablet Take 325 mg by mouth daily with breakfast.   FLUoxetine 20 MG tablet Commonly known as: PROZAC TAKE 1 TABLET BY MOUTH ONCE DAILY   Gas-X Extra Strength 125 MG Caps Generic drug: Simethicone Take 125 mg by mouth 2 (two) times daily as needed (GAS).   glucose blood test strip Commonly known as: ONE TOUCH ULTRA TEST Use as directed to monitor FSBS 5 x daily. Dx: E11.9. Notes to patient: Resume home regimen.    insulin NPH-regular Human (70-30) 100 UNIT/ML injection Commonly known as: NovoLIN 70/30 25 units q am and 15 units q pm What changed:   how much to take  how to take this  when to take this  additional instructions   Insulin Syringes (Disposable) U-100 1 ML Misc 1/2 inch 31 g   lisinopril 2.5 MG tablet Commonly known as: ZESTRIL Take 1 tablet by mouth once daily What changed:   when to take this  reasons to take this   LORazepam 0.5 MG tablet Commonly known as: ATIVAN TAKE 1 OR 2 TABLETS BY MOUTH AT BEDTIME AS NEEDED FOR  INSOMNIA What changed: See the new instructions.   Magnesium Oxide 400 (240 Mg) MG Tabs TAKE 1 TABLET BY MOUTH ONCE DAILY Notes to patient: Resume home regimen.    metFORMIN 1000 MG tablet Commonly known  as: GLUCOPHAGE Take 500 mg by mouth 2 (two) times daily with a meal. Notes to patient: Resume home regimen.    metoprolol tartrate 25 MG tablet Commonly known as: LOPRESSOR Take 1 tablet by mouth twice daily   multivitamin capsule Take 1 capsule by mouth daily. Notes to patient: Resume home regimen.    ONE TOUCH ULTRA 2 w/Device Kit Use as directed to monitor FSBS 3x daily. Dx: E11.9.   OneTouch Delica Lancets 38G Misc Use as directed to monitor FSBS 3x daily. Dx: E11.9.   oxyCODONE 5 MG immediate release tablet Commonly known as: Roxicodone Take 1 tablet (5 mg total) by mouth every 4 (four) hours as needed for severe pain.   pantoprazole 40 MG tablet  Commonly known as: PROTONIX Take 1 tablet by mouth once daily   polyethylene glycol 17 g packet Commonly known as: MIRALAX / GLYCOLAX Take 17 g by mouth daily. What changed:   when to take this  reasons to take this   [place] 1ML/31G 31G X 5/16" 1 ML Misc Generic drug: Insulin Syringe-Needle U-100   RELION INSULIN SYR 0.5ML/31G 31G X 5/16" 0.5 ML Misc Generic drug: Insulin Syringe-Needle U-100 USE AS DIRECTED   rosuvastatin 5 MG tablet Commonly known as: CRESTOR TAKE 1 TABLET BY MOUTH ONCE DAILY Notes to patient: Resume home regimen.    triamcinolone cream 0.1 % Commonly known as: KENALOG APPLY  CREAM EXTERNALLY TWICE DAILY What changed: See the new instructions.   valACYclovir 1000 MG tablet Commonly known as: VALTREX Take 1 tablet (1,000 mg total) by mouth 2 (two) times daily for 3 days. What changed: when to take this      Allergies  Allergen Reactions   Calcium-Containing Compounds Nausea Only   Morphine And Related Other (See Comments)    Hallucination   Raloxifene Itching    Evista- Face and eyes burning   Vitamin D Analogs Nausea Only   Discharge Instructions    Diet - low sodium heart healthy   Complete by: As directed    Increase activity slowly   Complete by: As  directed      Discharge Exam: Filed Weights   09/04/18 0500 09/05/18 0627 09/06/18 0500  Weight: 64.2 kg 64.2 kg 63 kg   Vitals:   09/06/18 1040 09/06/18 1307  BP: (!) 138/55 119/65  Pulse: 74 79  Resp: 17 19  Temp: 98 F (36.7 C) 97.6 F (36.4 C)  SpO2:  97%   General: Appear in no distress, left face zoster healing scab no new ulcer or vesicle; Oral Mucosa Clear, moist. no Abnormal Mass Or lumps Cardiovascular: S1 and S2 Present, no Murmur, Respiratory: normal respiratory effort, Bilateral Air entry present and Clear to Auscultation, no Crackles, no wheezes Abdomen: Bowel Sound present, Soft and no tenderness, no hernia Extremities: no Pedal edema, no calf tenderness Neurology: alert and oriented to time, place, and person affect appropriate. normal without focal findings, mental status, speech normal, alert and oriented x3, PERLA, Motor strength 5/5 and symmetric and sensation grossly normal to light touch   The results of significant diagnostics from this hospitalization (including imaging, microbiology, ancillary and laboratory) are listed below for reference.    Significant Diagnostic Studies: Dg Pelvis 1-2 Views  Result Date: 09/03/2018 CLINICAL DATA:  Fall.  Pain EXAM: PELVIS - 1-2 VIEW COMPARISON:  CT 09/03/2018. FINDINGS: Complex right acetabular fracture noted. Slight angulation deformity present. Diffuse osteopenia. Degenerative changes lumbar spine and both hips. Aortoiliac and peripheral atherosclerotic vascular calcification. Pelvic calcifications consistent phleboliths. IMPRESSION: 1. Complex right acetabular fracture with slight angulation deformity. 2.  Diffuse osteopenia and degenerative change. 3.  Aortoiliac and peripheral vascular disease. Electronically Signed   By: Marcello Moores  Register   On: 09/03/2018 06:08   Ct Head Wo Contrast  Result Date: 09/03/2018 CLINICAL DATA:  Minor head trauma.  Fall going to bathroom. EXAM: CT HEAD WITHOUT CONTRAST CT CERVICAL SPINE  WITHOUT CONTRAST TECHNIQUE: Multidetector CT imaging of the head and cervical spine was performed following the standard protocol without intravenous contrast. Multiplanar CT image reconstructions of the cervical spine were also generated. COMPARISON:  None. FINDINGS: CT HEAD FINDINGS Brain: No evidence of acute infarction, hemorrhage, hydrocephalus, extra-axial collection or mass lesion/mass effect. Perforator infarct at  the right basal ganglia, age indeterminate by imaging but chronic based on the history. Age congruent cerebral volume loss. Vascular: Atherosclerotic calcification Skull: Negative for fracture Sinuses/Orbits: No evidence of injury.  Right cataract resection. CT CERVICAL SPINE FINDINGS Alignment: No traumatic malalignment Skull base and vertebrae: Negative for acute fracture. Atlantooccipital non segmentation. Soft tissues and spinal canal: No prevertebral fluid or swelling. No visible canal hematoma. Multinodular thyroid that is partially covered. No invasive features. Disc levels: Generalized degenerative facet spurring and disc narrowing. C4-5 ACDF with solid arthrodesis Upper chest: Negative IMPRESSION: No evidence of acute intracranial or cervical spine injury. Electronically Signed   By: Monte Fantasia M.D.   On: 09/03/2018 06:24   Ct Cervical Spine Wo Contrast  Result Date: 09/03/2018 CLINICAL DATA:  Minor head trauma.  Fall going to bathroom. EXAM: CT HEAD WITHOUT CONTRAST CT CERVICAL SPINE WITHOUT CONTRAST TECHNIQUE: Multidetector CT imaging of the head and cervical spine was performed following the standard protocol without intravenous contrast. Multiplanar CT image reconstructions of the cervical spine were also generated. COMPARISON:  None. FINDINGS: CT HEAD FINDINGS Brain: No evidence of acute infarction, hemorrhage, hydrocephalus, extra-axial collection or mass lesion/mass effect. Perforator infarct at the right basal ganglia, age indeterminate by imaging but chronic based on the  history. Age congruent cerebral volume loss. Vascular: Atherosclerotic calcification Skull: Negative for fracture Sinuses/Orbits: No evidence of injury.  Right cataract resection. CT CERVICAL SPINE FINDINGS Alignment: No traumatic malalignment Skull base and vertebrae: Negative for acute fracture. Atlantooccipital non segmentation. Soft tissues and spinal canal: No prevertebral fluid or swelling. No visible canal hematoma. Multinodular thyroid that is partially covered. No invasive features. Disc levels: Generalized degenerative facet spurring and disc narrowing. C4-5 ACDF with solid arthrodesis Upper chest: Negative IMPRESSION: No evidence of acute intracranial or cervical spine injury. Electronically Signed   By: Monte Fantasia M.D.   On: 09/03/2018 06:24   Ct Pelvis Wo Contrast  Result Date: 09/03/2018 CLINICAL DATA:  Fall going to bathroom. Unable to lift right leg due to pain. EXAM: CT PELVIS WITHOUT CONTRAST TECHNIQUE: Multidetector CT imaging of the pelvis was performed following the standard protocol without intravenous contrast. COMPARISON:  09/26/2016 abdominal CT FINDINGS: Musculoskeletal: Branching fracture in the right acetabulum extending inferiorly along the puboacetabular junction. No inferior ramus or iliac wing fracture. Right hip joint effusion and small extraperitoneal pelvic hematoma. Femur is located and intact. Incidental advanced L4-5 spinal stenosis from posterior element hypertrophy and disc bulge. Urinary Tract:  No abnormality visualized. Bowel:  Unremarkable visualized pelvic bowel loops. Vascular/Lymphatic: Atherosclerosis. Reproductive:  Unremarkable for age IMPRESSION: 1. Nondisplaced, comminuted right acetabulum fracture without inferior ramus or iliac wing involvement. 2. Small extraperitoneal pelvic hematoma. Electronically Signed   By: Monte Fantasia M.D.   On: 09/03/2018 06:30   Dg Femur Min 2 Views Right  Result Date: 09/03/2018 CLINICAL DATA:  Fall. EXAM: RIGHT FEMUR  2 VIEWS COMPARISON:  AP pelvis 09/03/2018.  CT pelvis 09/03/2018. FINDINGS: Right femur is intact. No evidence of femoral fracture or dislocation complex right acetabular fracture again noted. Peripheral vascular calcification. IMPRESSION: 1. Right femur is intact. No evidence of femoral fracture or dislocation. 2.  Complex right acetabular fracture again noted. 3.  Peripheral vascular disease. Electronically Signed   By: Marcello Moores  Register   On: 09/03/2018 06:11    Microbiology: Recent Results (from the past 240 hour(s))  SARS Coronavirus 2 (CEPHEID - Performed in [place] hospital lab), Hosp Order     Status: None   Collection Time:  09/03/18  6:33 AM   Specimen: Nasopharyngeal Swab  Result Value Ref Range Status   SARS Coronavirus 2 NEGATIVE NEGATIVE Final    Comment: (NOTE) If result is NEGATIVE SARS-CoV-2 target nucleic acids are NOT DETECTED. The SARS-CoV-2 RNA is generally detectable in upper and lower  respiratory specimens during the acute phase of infection. The lowest  concentration of SARS-CoV-2 viral copies this assay can detect is 250  copies / mL. A negative result does not preclude SARS-CoV-2 infection  and should not be used as the sole basis for treatment or other  patient management decisions.  A negative result may occur with  improper specimen collection / handling, submission of specimen other  than nasopharyngeal swab, presence of viral mutation(s) within the  areas targeted by this assay, and inadequate number of viral copies  (<250 copies / mL). A negative result must be combined with clinical  observations, patient history, and epidemiological information. If result is POSITIVE SARS-CoV-2 target nucleic acids are DETECTED. The SARS-CoV-2 RNA is generally detectable in upper and lower  respiratory specimens dur ing the acute phase of infection.  Positive  results are indicative of active infection with SARS-CoV-2.  Clinical  correlation with patient history and  other diagnostic information is  necessary to determine patient infection status.  Positive results do  not rule out bacterial infection or co-infection with other viruses. If result is PRESUMPTIVE POSTIVE SARS-CoV-2 nucleic acids MAY BE PRESENT.   A presumptive positive result was obtained on the submitted specimen  and confirmed on repeat testing.  While 2019 novel coronavirus  (SARS-CoV-2) nucleic acids may be present in the submitted sample  additional confirmatory testing may be necessary for epidemiological  and / or clinical management purposes  to differentiate between  SARS-CoV-2 and other Sarbecovirus currently known to infect humans.  If clinically indicated additional testing with an alternate test  methodology [phone]) is advised. The SARS-CoV-2 RNA is generally  detectable in upper and lower respiratory sp ecimens during the acute  phase of infection. The expected result is Negative. Fact Sheet for Patients:  StrictlyIdeas.no Fact Sheet for Healthcare Providers: BankingDealers.co.za This test is not yet approved or cleared by the [place] FDA and has been authorized for detection and/or diagnosis of SARS-CoV-2 by FDA under an Emergency Use Authorization (EUA).  This EUA will remain in effect (meaning this test can be used) for the duration of the COVID-19 declaration under Section 564(b)(1) of the Act, 21 U.S.C. section 360bbb-3(b)(1), unless the authorization is terminated or revoked sooner. Performed at [facility], [address] [place], [place] [address]      Labs: CBC: Recent Labs  Lab 09/03/18 0556 09/04/18 0814 09/05/18 0725 09/06/18 0525  WBC 6.8 6.9 5.4 6.6  NEUTROABS 4.6  --   --   --   HGB 11.3* 10.5* 9.8* 12.2  HCT 33.2* 31.6* 30.6* 36.9  MCV 86.0 87.8 88.2 86.8  PLT 119* 124* 122* 662   Basic Metabolic Panel: Recent Labs  Lab 09/03/18 0556 09/04/18 0814 09/05/18 0725 09/06/18 0525  NA  132* 130* 132* 130*  K 4.9 4.1 3.8 3.8  CL 93* 96* 95* 91*  CO2 '25 25 26 26  ' GLUCOSE 172* 201* 135* 220*  BUN 26* '16 17 15  ' CREATININE 1.50* 0.93 0.84 0.76  CALCIUM 9.2 8.6* 8.6* 9.1  MG  --  2.2  --   --   PHOS  --  3.1 3.7 3.8   Liver Function Tests: Recent Labs  Lab 09/04/18 0814 09/05/18 0725 09/06/18 0525  ALBUMIN 3.8 3.2* 3.8   No results for input(s): LIPASE, AMYLASE in the last 168 hours. No results for input(s): AMMONIA in the last 168 hours. Cardiac Enzymes: Recent Labs  Lab 09/04/18 0814 09/05/18 0725  CKTOTAL 66 40   BNP (last 3 results) No results for input(s): BNP in the last 8760 hours. CBG: Recent Labs  Lab 09/05/18 1617 09/05/18 2219 09/06/18 0738 09/06/18 1128 09/06/18 1655  GLUCAP 171* 121* 261* 258* 189*   Time spent: 35 minutes  Signed:  Berle Mull  [facility] 09/06/2018

## 2018-09-08 NOTE — ED Notes (Signed)
Pt states she feels like she cannot breathe. Removed N95 and placed surgical mask on pt. Pt O2 has remained at 98% since arrival to ED.

## 2018-09-08 NOTE — ED Notes (Signed)
Patient stated she could not breathe again, vitals suggest otherwise. Oxygen saturation is currently at 98% room air.

## 2018-09-08 NOTE — ED Triage Notes (Signed)
Per family pt is more confused since being discharged from hospital a couple days ago.

## 2018-09-08 NOTE — Telephone Encounter (Signed)
[name] called and states that the pt is talking out of her head and wonders if she has a UTI?  She is talking about going to see Jesus and she has had enough of living on this earth. I left cup up front and [name] will get urine sample and bring back to office. Also informed her that if she hit her head when she fell she may need to go back to ER for evaluation. She states that she did hit her head. I left it at her discretion as to what she wanted to do.

## 2018-09-09 ENCOUNTER — Emergency Department (HOSPITAL_COMMUNITY): Payer: Medicare HMO

## 2018-09-09 DIAGNOSIS — J449 Chronic obstructive pulmonary disease, unspecified: Secondary | ICD-10-CM | POA: Diagnosis not present

## 2018-09-09 DIAGNOSIS — B0230 Zoster ocular disease, unspecified: Secondary | ICD-10-CM | POA: Diagnosis not present

## 2018-09-09 DIAGNOSIS — R410 Disorientation, unspecified: Secondary | ICD-10-CM | POA: Diagnosis not present

## 2018-09-09 LAB — COMPREHENSIVE METABOLIC PANEL
ALT: 27 U/L (ref 0–44)
AST: 26 U/L (ref 15–41)
Albumin: 3.7 g/dL (ref 3.5–5.0)
Alkaline Phosphatase: 45 U/L (ref 38–126)
Anion gap: 13 (ref 5–15)
BUN: 21 mg/dL (ref 8–23)
CO2: 25 mmol/L (ref 22–32)
Calcium: 8.9 mg/dL (ref 8.9–10.3)
Chloride: 89 mmol/L — ABNORMAL LOW (ref 98–111)
Creatinine, Ser: 0.9 mg/dL (ref 0.44–1.00)
GFR calc Af Amer: >60 mL/min (ref >60)
GFR calc non Af Amer: 57 mL/min — ABNORMAL LOW (ref >60)
Glucose, Bld: 258 mg/dL — ABNORMAL HIGH (ref 70–99)
Potassium: 3.7 mmol/L (ref 3.5–5.1)
Sodium: 127 mmol/L — ABNORMAL LOW (ref 135–145)
Total Bilirubin: 1.1 mg/dL (ref 0.3–1.2)
Total Protein: 7.2 g/dL (ref 6.5–8.1)

## 2018-09-09 LAB — BASIC METABOLIC PANEL
Anion gap: 12 (ref 5–15)
BUN: 20 mg/dL (ref 8–23)
CO2: 26 mmol/L (ref 22–32)
Calcium: 8.6 mg/dL — ABNORMAL LOW (ref 8.9–10.3)
Chloride: 93 mmol/L — ABNORMAL LOW (ref 98–111)
Creatinine, Ser: 0.86 mg/dL (ref 0.44–1.00)
GFR calc Af Amer: >60 mL/min (ref >60)
GFR calc non Af Amer: 60 mL/min — ABNORMAL LOW (ref >60)
Glucose, Bld: 207 mg/dL — ABNORMAL HIGH (ref 70–99)
Potassium: 4.1 mmol/L (ref 3.5–5.1)
Sodium: 131 mmol/L — ABNORMAL LOW (ref 135–145)

## 2018-09-09 LAB — URINALYSIS, ROUTINE W REFLEX MICROSCOPIC
Bilirubin Urine: NEGATIVE
Glucose, UA: >=500 mg/dL — AB
Hgb urine dipstick: NEGATIVE
Ketones, ur: NEGATIVE mg/dL
Leukocytes,Ua: NEGATIVE
Nitrite: NEGATIVE
Protein, ur: NEGATIVE mg/dL
Specific Gravity, Urine: 1.002 — ABNORMAL LOW (ref 1.005–1.030)
pH: 7 (ref 5.0–8.0)

## 2018-09-09 LAB — CBG MONITORING, ED
Glucose-Capillary: 215 mg/dL — ABNORMAL HIGH (ref 70–99)
Glucose-Capillary: 271 mg/dL — ABNORMAL HIGH (ref 70–99)

## 2018-09-09 LAB — SARS CORONAVIRUS 2 BY RT PCR (HOSPITAL ORDER, PERFORMED IN ~~LOC~~ HOSPITAL LAB): SARS Coronavirus 2: NEGATIVE

## 2018-09-09 MED ORDER — LISINOPRIL 5 MG PO TABS
2.5000 mg | ORAL_TABLET | Freq: Every day | ORAL | Status: DC
  Administered 2018-09-10 – 2018-09-12 (×3): 2.5 mg via ORAL
  Filled 2018-09-09 (×4): qty 1

## 2018-09-09 MED ORDER — METOPROLOL TARTRATE 25 MG PO TABS
25.0000 mg | ORAL_TABLET | Freq: Two times a day (BID) | ORAL | Status: DC
  Administered 2018-09-09 – 2018-09-10 (×3): 25 mg via ORAL
  Filled 2018-09-09 (×3): qty 1

## 2018-09-09 MED ORDER — SODIUM CHLORIDE 0.9 % IV BOLUS
1000.0000 mL | Freq: Once | INTRAVENOUS | Status: AC
  Administered 2018-09-09: 1000 mL via INTRAVENOUS

## 2018-09-09 MED ORDER — SODIUM CHLORIDE 0.9 % IV BOLUS
500.0000 mL | Freq: Once | INTRAVENOUS | Status: AC
  Administered 2018-09-09: 500 mL via INTRAVENOUS

## 2018-09-09 MED ORDER — MAGNESIUM OXIDE -MG SUPPLEMENT 400 (240 MG) MG PO TABS
1.0000 | ORAL_TABLET | Freq: Every day | ORAL | Status: DC
  Filled 2018-09-09 (×2): qty 1

## 2018-09-09 MED ORDER — TRIAMCINOLONE ACETONIDE 0.1 % EX CREA: TOPICAL_CREAM | CUTANEOUS | Status: DC | PRN

## 2018-09-09 MED ORDER — LORAZEPAM 0.5 MG PO TABS
0.5000 mg | ORAL_TABLET | Freq: Every evening | ORAL | Status: DC | PRN
  Administered 2018-09-12: 0.5 mg via ORAL
  Filled 2018-09-09: qty 1

## 2018-09-09 MED ORDER — INSULIN ASPART PROT & ASPART (70-30 MIX) 100 UNIT/ML ~~LOC~~ SUSP
15.0000 [IU] | Freq: Every day | SUBCUTANEOUS | Status: DC
  Administered 2018-09-10 – 2018-09-12 (×3): 15 [IU] via SUBCUTANEOUS
  Filled 2018-09-09 (×2): qty 10

## 2018-09-09 MED ORDER — AMLODIPINE BESYLATE 5 MG PO TABS
5.0000 mg | ORAL_TABLET | Freq: Every day | ORAL | Status: DC
  Administered 2018-09-10 – 2018-09-12 (×3): 5 mg via ORAL
  Filled 2018-09-09 (×4): qty 1

## 2018-09-09 MED ORDER — CYCLOSPORINE 0.05 % OP EMUL
1.0000 [drp] | Freq: Three times a day (TID) | OPHTHALMIC | Status: DC | PRN
  Filled 2018-09-09: qty 30

## 2018-09-09 MED ORDER — INSULIN ASPART PROT & ASPART (70-30 MIX) 100 UNIT/ML ~~LOC~~ SUSP
25.0000 [IU] | Freq: Every day | SUBCUTANEOUS | Status: DC
  Administered 2018-09-10 – 2018-09-13 (×4): 25 [IU] via SUBCUTANEOUS
  Filled 2018-09-09: qty 10

## 2018-09-09 MED ORDER — FERROUS SULFATE 325 (65 FE) MG PO TABS
325.0000 mg | ORAL_TABLET | Freq: Every day | ORAL | Status: DC
  Administered 2018-09-10 – 2018-09-23 (×14): 325 mg via ORAL
  Filled 2018-09-09 (×17): qty 1

## 2018-09-09 MED ORDER — MAGNESIUM OXIDE -MG SUPPLEMENT 400 (240 MG) MG PO TABS
1.0000 | ORAL_TABLET | Freq: Every day | ORAL | Status: DC
  Filled 2018-09-09 (×4): qty 1

## 2018-09-09 MED ORDER — ROSUVASTATIN CALCIUM 5 MG PO TABS
5.0000 mg | ORAL_TABLET | Freq: Every day | ORAL | Status: DC
  Filled 2018-09-09 (×2): qty 1

## 2018-09-09 MED ORDER — FLUOXETINE HCL 20 MG PO CAPS
20.0000 mg | ORAL_CAPSULE | Freq: Every day | ORAL | Status: DC
  Administered 2018-09-09 – 2018-09-23 (×15): 20 mg via ORAL
  Filled 2018-09-09 (×15): qty 1

## 2018-09-09 MED ORDER — TIMOLOL MALEATE 0.5 % OP SOLN
1.0000 [drp] | Freq: Two times a day (BID) | OPHTHALMIC | Status: DC
  Administered 2018-09-10 – 2018-09-23 (×27): 1 [drp] via OPHTHALMIC
  Filled 2018-09-09 (×3): qty 5

## 2018-09-09 MED ORDER — ROSUVASTATIN CALCIUM 10 MG PO TABS
5.0000 mg | ORAL_TABLET | Freq: Every day | ORAL | Status: DC
  Administered 2018-09-10 – 2018-09-23 (×14): 5 mg via ORAL
  Filled 2018-09-09 (×16): qty 1

## 2018-09-09 MED ORDER — VALACYCLOVIR HCL 500 MG PO TABS
1000.0000 mg | ORAL_TABLET | Freq: Two times a day (BID) | ORAL | Status: DC
  Administered 2018-09-09 – 2018-09-10 (×2): 1000 mg via ORAL
  Filled 2018-09-09 (×2): qty 2

## 2018-09-09 MED ORDER — PANCRELIPASE (LIP-PROT-AMYL) 12000-38000 UNITS PO CPEP
24000.0000 [IU] | ORAL_CAPSULE | Freq: Every day | ORAL | Status: DC
  Administered 2018-09-09 – 2018-09-23 (×61): 24000 [IU] via ORAL
  Filled 2018-09-09 (×73): qty 2

## 2018-09-09 MED ORDER — BRIMONIDINE TARTRATE-TIMOLOL 0.2-0.5 % OP SOLN
1.0000 [drp] | Freq: Two times a day (BID) | OPHTHALMIC | Status: DC
  Filled 2018-09-09: qty 5

## 2018-09-09 MED ORDER — PANTOPRAZOLE SODIUM 40 MG PO TBEC
40.0000 mg | DELAYED_RELEASE_TABLET | Freq: Every day | ORAL | Status: DC
  Administered 2018-09-09 – 2018-09-23 (×15): 40 mg via ORAL
  Filled 2018-09-09 (×15): qty 1

## 2018-09-09 MED ORDER — ENSURE ENLIVE PO LIQD
237.0000 mL | Freq: Two times a day (BID) | ORAL | Status: DC
  Administered 2018-09-10 – 2018-09-14 (×9): 237 mL via ORAL
  Filled 2018-09-09 (×11): qty 237

## 2018-09-09 MED ORDER — ADULT MULTIVITAMIN W/MINERALS CH
1.0000 | ORAL_TABLET | Freq: Every day | ORAL | Status: DC
  Administered 2018-09-09 – 2018-09-23 (×15): 1 via ORAL
  Filled 2018-09-09 (×15): qty 1

## 2018-09-09 MED ORDER — BRIMONIDINE TARTRATE 0.2 % OP SOLN
1.0000 [drp] | Freq: Two times a day (BID) | OPHTHALMIC | Status: DC
  Administered 2018-09-10 – 2018-09-23 (×27): 1 [drp] via OPHTHALMIC
  Filled 2018-09-09 (×3): qty 5

## 2018-09-09 MED ORDER — ASPIRIN 81 MG PO TBEC
81.0000 mg | DELAYED_RELEASE_TABLET | Freq: Every day | ORAL | Status: DC
  Administered 2018-09-09 – 2018-09-23 (×15): 81 mg via ORAL
  Filled 2018-09-09 (×31): qty 1

## 2018-09-09 NOTE — ED Provider Notes (Addendum)
Emergency Department Provider Note   I have reviewed the triage vital signs and the nursing notes.   HISTORY  Chief Complaint Altered Mental Status   HPI Misty Edwards is a 85 y.o. female who presents to the emergency department today secondary to altered mental status.  History is obtained from the daughter as the patient is not able to participate secondary to her altered mental status.  The daughter states that ever since she came off in the hospital a few days ago after having had the pelvis fracture that the patient has not been icemaker self and has been progressively worsening.  She is confused with memory loss and she is persistently talking about how she is going to meet Jesus and thinks that red drink is actually his blood.  Daughter states that this is nothing like her mother she does have a little bit of memory issues but this is significantly worse than previously.  Appears that her shingles is improving.   No other associated or modifying symptoms.   Level V Caveat 2/2 confusion  Past Medical History:  Diagnosis Date  . Acute biliary pancreatitis 07/2002   thia was in 07/2004:she still has pseudocyst in tail of pancreas measuring 54 x 33 mm   . Adrenal adenoma    bilateral  . Anxiety   . Chronic pancreatitis ([place])   . Detached retina   . Diabetes mellitus ([place])   . Diverticulosis   . History of carcinoma in situ of breast 1988  . Hyperlipidemia   . Hypertension   . IBS (irritable bowel syndrome)   . Legally blind   . Osteoarthritis   . Osteopenia   . Upper GI bleed September2004   secondary to gastritis    Patient Active Problem List   Diagnosis Date Noted  . Herpes zoster ophthalmicus 09/03/2018  . Right acetabular fracture ([place]) 09/03/2018  . Syncope and collapse 09/03/2018  . Protein-calorie malnutrition, severe 09/03/2018  . Vaginal atrophy 04/29/2018  . Angiokeratoma 04/29/2018  . PMB (postmenopausal bleeding) 04/29/2018  . Acute on chronic  diastolic CHF (congestive heart failure) ([place]) 10/23/2016  . Acute respiratory failure with hypoxia ([place]) 10/23/2016  . Nephrolithiasis 09/27/2016  . Hydronephrosis with renal and ureteral calculus obstruction 09/27/2016  . Hydronephrosis 09/27/2016  . Heme positive stool 09/05/2016  . Dyslipidemia 03/07/2015  . Type 2 diabetes mellitus with vascular disease ([place]) 03/07/2015  . Anemia 09/26/2014  . Lesion of lip 09/25/2014  . S/P CABG x 3 08/25/2014  . NSTEMI (non-ST elevated myocardial infarction) ([place]) 08/24/2014  . Acute coronary syndrome ([place])   . IBS (irritable bowel syndrome) 08/26/2012  . Thrombocytopenia ([place]) 02/02/2012  . Epigastric pain 02/02/2012  . Gastritis 02/02/2012  . Abdominal pain 12/17/2011  . Normocytic anemia 12/17/2011  . History of colon polyps 12/17/2011  . Constipation 12/17/2011  . Family history of colon cancer 08/22/2011  . LLQ pain 08/21/2011  . Chronic pancreatitis ([place]) 09/12/2008  . HELICOBACTER PYLORI INFECTION 01/26/2008  . ADENOCARCINOMA, BREAST 01/26/2008  . Essential hypertension 01/26/2008  . External hemorrhoids 01/26/2008  . GERD 01/26/2008  . PSEUDOCYST, PANCREAS 01/26/2008  . GALLSTONE PANCREATITIS 01/26/2008  . GI BLEEDING 01/26/2008  . UTI 01/26/2008  . NAUSEA 01/26/2008  . VOMITING 01/26/2008  . DIARRHEA 01/26/2008    Past Surgical History:  Procedure Laterality Date  . CARDIAC CATHETERIZATION N/A 08/24/2014   Procedure: Left Heart Cath and Coronary Angiography;  Surgeon: Leonie Man, MD;  Location: [facility];  Service:  Cardiovascular;  Laterality: N/A;  . COLONOSCOPY  08/15/05   few tiny diverticula at sigmoid colon/external hemorrhoids but no polyps  . COLONOSCOPY  01/08/2012   BZJ:IRCVELF diverticulosis. Next colonoscopy in 12/2016  . CORONARY ARTERY BYPASS GRAFT N/A 08/24/2014   Procedure: CORONARY ARTERY BYPASS GRAFTING (CABG) x three, using left internal mammary artery and right leg greater saphenous vein  harvested endoscopically;  Surgeon: Ivin Poot, MD;  Location: [facility];  Service: Open Heart Surgery;  Laterality: N/A;  . ECTOPIC PREGNANCY SURGERY  1950's  . ERCP with sphincterotomy  07/2002  . ESOPHAGOGASTRODUODENOSCOPY      Gastritis of body.  Otherwise normal  . ESOPHAGOGASTRODUODENOSCOPY  01/08/2012   RMR: Few scattered gastric erosions of uncertain significance-status post biopsy. Minimal chronic inflammation, no H.pylori  . LAPAROSCOPIC CHOLECYSTECTOMY    . PANCREATIC PSEUDOCYST DRAINAGE  [id number]   drained percutaneously   . right mastectomy    . tacking up of her bladder    . TEE WITHOUT CARDIOVERSION N/A 08/24/2014   Procedure: TRANSESOPHAGEAL ECHOCARDIOGRAM (TEE);  Surgeon: Ivin Poot, MD;  Location: [facility];  Service: Open Heart Surgery;  Laterality: N/A;    Current Outpatient Rx  . Order #: [id number] Class: Normal  . Order #: [id number] Class: OTC  . Order #: [id number] Class: Normal  . Order #: [id number] Class: Historical Med  . Order #: [id number] Class: Print  . Order #: [id number] Class: Historical Med  . Order #: [id number] Class: Normal  . Order #: [id number] Class: Historical Med  . Order #: [id number] Class: Normal  . Order #: [id number] Class: Normal  . Order #: [id number] Class: Normal  . Order #: [id number] Class: Normal  . Order #: [id number] Class: Normal  . Order #: [id number] Class: Normal  . Order #: [id number] Class: Normal  . Order #: [id number] Class: Historical Med  . Order #: [id number] Class: Normal  . Order #: [id number] Class: Historical Med  . Order #: [id number] Class: Normal  . Order #: [id number] Class: Normal  . Order #: [id number] Class: Normal  . Order #: [id number] Class: Normal  . Order #: [id number] Class: Normal  . Order #: [id number] Class: Historical Med  . Order #: [id number] Class: Normal  . Order #: [id number] Class: Historical Med  . Order #: [id number] Class: Normal  . Order #: [id number] Class: No Print  . Order #: [id number] Class: Historical Med    Allergies  Calcium-containing compounds, Morphine and related, Raloxifene, and Vitamin d analogs  Family History  Problem Relation Age of Onset  . Ovarian cancer Mother        passed  . Colon cancer Father 61       passed away in his 43's  . Colon cancer Brother 62       surgery inhis 50's doing well now  . Heart attack Brother        passed away age 66  . Pancreatitis Brother   . Kidney disease Daughter   . Leukemia Son     Social History Social History   Tobacco Use  . Smoking status: Never Smoker  . Smokeless tobacco: Never Used  Substance Use Topics  . Alcohol use: No    Alcohol/week: 0.0 standard drinks  . Drug use: No    Review of Systems  All other systems negative except as documented in the HPI. All pertinent positives and negatives as reviewed in the HPI. ____________________________________________   PHYSICAL EXAM:  VITAL SIGNS: ED Triage Vitals  Enc Vitals Group     BP 09/08/18 2200 (!) 181/67     Pulse Rate 09/08/18 2200 72     Resp  09/08/18 2200 (!) 23     Temp 09/09/18 0107 98.9 F (37.2 C)     Temp Source 09/09/18 0107 Rectal     SpO2 09/08/18 2200 98 %    Constitutional: Alert and disoriented. Well appearing and in no acute distress. Eyes: Conjunctivae are normal. PERRL. EOMI. Head: Atraumatic. Nose: No congestion/rhinnorhea. Mouth/Throat: Mucous membranes are moist.  Oropharynx non-erythematous. Neck: No stridor.  No meningeal signs.   Cardiovascular: Normal rate, regular rhythm. Good peripheral circulation. Grossly normal heart sounds.   Respiratory: Normal respiratory effort.  No retractions. Lungs CTAB. Gastrointestinal: Soft and nontender. No distention.  Musculoskeletal: No lower extremity tenderness nor edema. No gross deformities of extremities. Neurologic:  Normal speech and language. No gross focal neurologic deficits are appreciated.  Skin:  Skin is warm, dry and intact. Scabs to left upper face, no vesicles    ____________________________________________   LABS (all labs ordered are listed, but only abnormal results are displayed)  Labs Reviewed  COMPREHENSIVE METABOLIC PANEL - Abnormal; Notable for the following components:      Result Value   Sodium 127 (*)    Chloride 89 (*)    Glucose, Bld 258 (*)    GFR calc non Af Amer 57 (*)    All other components within normal limits  CBC - Abnormal; Notable for the following components:   Hemoglobin 11.8 (*)    HCT 35.4 (*)    All other components within normal limits  URINALYSIS, ROUTINE W REFLEX MICROSCOPIC - Abnormal; Notable for the following components:   Color, Urine STRAW (*)    Specific Gravity, Urine 1.002 (*)    Glucose, UA >=500 (*)    Bacteria, UA RARE (*)    All other components within normal limits  CBG MONITORING, ED - Abnormal; Notable for the following components:   Glucose-Capillary 253 (*)    All other components within normal limits  URINE CULTURE  BASIC METABOLIC PANEL   ____________________________________________  EKG   EKG Interpretation  Date/Time:  Wednesday September 08 2018 21:58:45 EDT Ventricular Rate:  78 PR Interval:    QRS Duration: 86 QT Interval:  442 QTC Calculation: 504 R Axis:   -9 Text Interpretation:  Sinus rhythm Supraventricular bigeminy Short PR interval Anterior infarct, old Minimal ST depression, lateral leads Prolonged QT interval Confirmed by Merrily Pew [phone]) on 09/09/2018 1:41:35 AM       ____________________________________________  RADIOLOGY  Dg Chest 2 View  Result Date: 09/09/2018 CLINICAL DATA:  Altered mental status EXAM: CHEST - 2 VIEW COMPARISON:  10/23/2016 FINDINGS: The lungs are hyperinflated with diffuse interstitial prominence. No focal airspace consolidation or pulmonary edema. No pleural effusion or pneumothorax. Normal cardiomediastinal contours. There are right axillary surgical clips. Remote median sternotomy and CABG. IMPRESSION: COPD without acute airspace  disease. Electronically Signed   By: Ulyses Jarred M.D.   On: 09/09/2018 02:26   Ct Head Wo Contrast  Result Date: 09/09/2018 CLINICAL DATA:  Confusion EXAM: CT HEAD WITHOUT CONTRAST TECHNIQUE: Contiguous axial images were obtained from the base of the skull through the vertex without intravenous contrast. COMPARISON:  CT head dated September 03, 2018 FINDINGS: Brain: No evidence of acute infarction, hemorrhage, hydrocephalus, extra-axial collection or mass lesion/mass effect. Volume loss and chronic microvascular ischemic changes are again noted. Vascular: No hyperdense vessel or unexpected calcification. Skull: Normal. Negative for fracture or focal lesion. Sinuses/Orbits: No acute finding. The patient is status post prior cataract surgery on the right. Other: None. IMPRESSION: No acute intracranial abnormality.  Electronically Signed   By: Constance Holster M.D.   On: 09/09/2018 02:11    ____________________________________________   PROCEDURES  Procedure(s) performed:   Procedures   ____________________________________________   INITIAL IMPRESSION / ASSESSMENT AND PLAN / ED COURSE  Workup for confusion. Delirium? UTI? Hyponatremia?  Work-up as above with a slightly low sodium but no evidence of infection.  Discussed this with the hospitalist and it seemed the patient was at 130 a few days ago and thus this is not really that big a drop.  He did not think this was related to her altered mental status.  This could just be delirium from being in the hospital however will get TTS consult to make sure there is no medication recommendations or possible Geri psych placement that they would suggest.  She is medically cleared, however we will recheck a BMP just to verify the sodium is not any worse than it was. If TTS doesn't think she meets inpt criteria, can be dc back to daughter. No fevers, headaches or other evidence of herpes encephalitis.  Care transferred pending same.  Pertinent labs &  imaging results that were available during my care of the patient were reviewed by me and considered in my medical decision making (see chart for details).  ____________________________________________  FINAL CLINICAL IMPRESSION(S) / ED DIAGNOSES  Final diagnoses:  None     MEDICATIONS GIVEN DURING THIS VISIT:  Medications  sodium chloride 0.9 % bolus 500 mL (has no administration in time range)  sodium chloride flush (NS) 0.9 % injection 3 mL (3 mLs Intravenous Given 09/08/18 2305)  sodium chloride 0.9 % bolus 1,000 mL (1,000 mLs Intravenous New Bag/Given 09/09/18 0443)     NEW OUTPATIENT MEDICATIONS STARTED DURING THIS VISIT:  New Prescriptions   No medications on file    Note:  This note was prepared with assistance of Dragon voice recognition software. Occasional wrong-word or sound-a-like substitutions may have occurred due to the inherent limitations of voice recognition software.   Teyton Pattillo, Corene Cornea, MD 09/09/18 6269    Merrily Pew, MD 09/09/18 [phone]

## 2018-09-09 NOTE — ED Notes (Signed)
Dr. Lacinda Axon made aware that PTA meds need to be evaluated and ordered.

## 2018-09-09 NOTE — ED Notes (Signed)
Dr. Reather Converse made aware that PTA meds needs to be evaluated and ordered.

## 2018-09-09 NOTE — Progress Notes (Addendum)
Patient is psychiatrically cleared per [facility], Physician Extender, Mordecai Maes.  Magda Paganini, [email or web address]  notified.   Patient would benefit from referrals for ALF Memory-care or SNF. Social Work Media planner for same.  Areatha Keas. Judi Cong, MSW, [place] Disposition Clinical Social Work [phone] (cell) [phone] (office)

## 2018-09-09 NOTE — BH Assessment (Signed)
  [place] ASSESSMENT DISPOSITION   [facility] discussed case with [place] Provider, Mordecai Maes, NP who stated Family Collateral needed to be gathered prior to disposition.  [facility] reconvene with [facility] Provider after family collateral at which time pt was psych cleared.  Tramar Brueckner L. [place], [place], [facility], [facility] Therapeutic Triage Specialist  [phone]

## 2018-09-09 NOTE — ED Notes (Signed)
Lab tech stuck pt 3 times and not able to obtain blood.  Tech to come from floor to attempt.

## 2018-09-09 NOTE — NC FL2 (Signed)
[place] LEVEL OF CARE SCREENING TOOL     IDENTIFICATION  Patient Name: Misty Edwards Birthdate: [birth year] Sex: female Admission Date (Current Location): 09/08/2018  [facility] and [place] Number:  [employer] and Address:  [place] [address], [place]      Provider Number: [id number]  Attending Physician Name and Address:  Default, Provider, MD  Relative Name and Phone Number:  [name], daughter, [place]    Current Level of Care: Hospital Recommended Level of Care: [place] Prior Approval Number:    Date Approved/Denied:   PASRR Number: [id number]  Discharge Plan: SNF    Current Diagnoses: Patient Active Problem List   Diagnosis Date Noted  . Herpes zoster ophthalmicus 09/03/2018  . Right acetabular fracture ([place]) 09/03/2018  . Syncope and collapse 09/03/2018  . Protein-calorie malnutrition, severe 09/03/2018  . Vaginal atrophy 04/29/2018  . Angiokeratoma 04/29/2018  . PMB (postmenopausal bleeding) 04/29/2018  . Acute on chronic diastolic CHF (congestive heart failure) ([place]) 10/23/2016  . Acute respiratory failure with hypoxia ([place]) 10/23/2016  . Nephrolithiasis 09/27/2016  . Hydronephrosis with renal and ureteral calculus obstruction 09/27/2016  . Hydronephrosis 09/27/2016  . Heme positive stool 09/05/2016  . Dyslipidemia 03/07/2015  . Type 2 diabetes mellitus with vascular disease ([place]) 03/07/2015  . Anemia 09/26/2014  . Lesion of lip 09/25/2014  . S/P CABG x 3 08/25/2014  . NSTEMI (non-ST elevated myocardial infarction) ([place]) 08/24/2014  . Acute coronary syndrome ([place])   . IBS (irritable bowel syndrome) 08/26/2012  . Thrombocytopenia ([place]) 02/02/2012  . Epigastric pain 02/02/2012  . Gastritis 02/02/2012  . Abdominal pain 12/17/2011  . Normocytic anemia 12/17/2011  . History of colon polyps 12/17/2011  . Constipation 12/17/2011  . Family history of colon cancer 08/22/2011   . LLQ pain 08/21/2011  . Chronic pancreatitis ([place]) 09/12/2008  . HELICOBACTER PYLORI INFECTION 01/26/2008  . ADENOCARCINOMA, BREAST 01/26/2008  . Essential hypertension 01/26/2008  . External hemorrhoids 01/26/2008  . GERD 01/26/2008  . PSEUDOCYST, PANCREAS 01/26/2008  . GALLSTONE PANCREATITIS 01/26/2008  . GI BLEEDING 01/26/2008  . UTI 01/26/2008  . NAUSEA 01/26/2008  . VOMITING 01/26/2008  . DIARRHEA 01/26/2008    Orientation RESPIRATION BLADDER Height & Weight     Self, Place  Normal Continent Weight:   Height:     BEHAVIORAL SYMPTOMS/MOOD NEUROLOGICAL BOWEL NUTRITION STATUS  (none) (none) Continent Diet  AMBULATORY STATUS COMMUNICATION OF NEEDS Skin   Extensive Assist Verbally Normal                       Personal Care Assistance Level of Assistance  Bathing, Feeding, Dressing Bathing Assistance: Maximum assistance Feeding assistance: Independent Dressing Assistance: Maximum assistance     Functional Limitations Info  Sight, Hearing, Speech Sight Info: Adequate Hearing Info: Adequate Speech Info: Adequate    SPECIAL CARE FACTORS FREQUENCY  PT (By licensed PT)     PT Frequency: 5X/W              Contractures Contractures Info: Not present    Additional Factors Info  Code Status, Allergies Code Status Info: full Allergies Info: Raloxifene, Morphine and related, Vitamin D analogs, Calcium-containing compounds           Current Medications (09/09/2018):  This is the current hospital active medication list No current facility-administered medications for this encounter.    Current Outpatient Medications  Medication Sig Dispense Refill  . amLODipine (NORVASC) 5  MG tablet TAKE 1 TABLET BY MOUTH ONCE DAILY 90 tablet 0  . aspirin EC 81 MG EC tablet Take 1 tablet (81 mg total) by mouth daily.    . Blood Glucose Monitoring Suppl (ONE TOUCH ULTRA 2) w/Device KIT Use as directed to monitor FSBS 3x daily. Dx: E11.9. 1 each 0  . brimonidine-timolol  (COMBIGAN) 0.2-0.5 % ophthalmic solution Place 1 drop into both eyes every 12 (twelve) hours.    Marland Kitchen CREON 24000-76000 units CPEP TAKE 2 CAPSULES BY MOUTH WITH MEALS AND 1 CAP WITH SNACKS x 2 (Patient taking differently: Take 1-2 capsules by mouth 5 (five) times daily. Patient takes 2 capsules with meals and 1 capsule with snacks) 300 capsule 11  . cycloSPORINE (RESTASIS) 0.05 % ophthalmic emulsion Place 1 drop into both eyes 3 (three) times daily as needed.     . feeding supplement, ENSURE ENLIVE, (ENSURE ENLIVE) LIQD Take 237 mLs by mouth 2 (two) times daily between meals. 237 mL 12  . ferrous sulfate 325 (65 FE) MG tablet Take 325 mg by mouth daily with breakfast.    . FLUoxetine (PROZAC) 20 MG tablet TAKE 1 TABLET BY MOUTH ONCE DAILY 90 tablet 3  . glucose blood (ONE TOUCH ULTRA TEST) test strip Use as directed to monitor FSBS 5 x daily. Dx: E11.9. 450 each 3  . insulin NPH-regular Human (NOVOLIN 70/30) (70-30) 100 UNIT/ML injection 25 units q am and 15 units q pm (Patient taking differently: Inject 15-25 Units into the skin 2 (two) times daily with a meal. Patient takes 25 units in the morning and 15 units in the evening) 10 mL 11  . Insulin Syringes, Disposable, U-100 1 ML MISC 1/2 inch 31 g 300 each 3  . lisinopril (ZESTRIL) 2.5 MG tablet Take 1 tablet by mouth once daily (Patient taking differently: Take 2.5 mg by mouth daily as needed. ) 90 tablet 0  . LORazepam (ATIVAN) 0.5 MG tablet TAKE 1 OR 2 TABLETS BY MOUTH AT BEDTIME AS NEEDED FOR  INSOMNIA (Patient taking differently: Take 0.5-1 mg by mouth at bedtime as needed. ) 60 tablet 0  . Magnesium Oxide 400 (240 Mg) MG TABS TAKE 1 TABLET BY MOUTH ONCE DAILY 90 tablet 3  . metFORMIN (GLUCOPHAGE) 1000 MG tablet Take 500 mg by mouth 2 (two) times daily with a meal.    . metoprolol tartrate (LOPRESSOR) 25 MG tablet Take 1 tablet by mouth twice daily 180 tablet 3  . Multiple Vitamin (MULTIVITAMIN) capsule Take 1 capsule by mouth daily.    Marland Kitchen  neomycin-polymyxin b-dexamethasone (MAXITROL) 3.5-10000-0.1 OINT APPLY OINTMENT INTO LEFT EYE THREE TIMES DAILY AS DIRECTED    . OneTouch Delica Lancets 27O MISC Use as directed to monitor FSBS 3x daily. Dx: E11.9. 300 each 2  . oxyCODONE (ROXICODONE) 5 MG immediate release tablet Take 1 tablet (5 mg total) by mouth every 4 (four) hours as needed for severe pain. 30 tablet 0  . pantoprazole (PROTONIX) 40 MG tablet Take 1 tablet by mouth once daily 90 tablet 0  . RELION INSULIN SYR 0.5ML/31G 31G X 5/16" 0.5 ML MISC USE AS DIRECTED 300 each 0  . RELION INSULIN SYRINGE 1ML/31G 31G X 5/16" 1 ML MISC     . rosuvastatin (CRESTOR) 5 MG tablet TAKE 1 TABLET BY MOUTH ONCE DAILY 90 tablet 3  . Simethicone (GAS-X EXTRA STRENGTH) 125 MG CAPS Take 125 mg by mouth 2 (two) times daily as needed (GAS).    Marland Kitchen triamcinolone cream (KENALOG) 0.1 %  APPLY  CREAM EXTERNALLY TWICE DAILY (Patient taking differently: as needed. ) 60 g 1  . valACYclovir (VALTREX) 1000 MG tablet Take 1 tablet (1,000 mg total) by mouth 2 (two) times daily for 3 days. 21 tablet 0  . Wheat Dextrin (BENEFIBER DRINK MIX) PACK Take 1 Package by mouth daily.    . polyethylene glycol (MIRALAX / GLYCOLAX) 17 g packet Take 17 g by mouth daily. 14 each 0     Discharge Medications: Please see discharge summary for a list of discharge medications.  Relevant Imaging Results:  Relevant Lab Results:   Additional [place], LCSW

## 2018-09-09 NOTE — ED Notes (Signed)
Dr. Lacinda Axon made aware that she needs her nightly 10 units novolog.

## 2018-09-09 NOTE — TOC Progression Note (Signed)
Transition of Care [facility]) - Progression Note    Patient Details  Name: Misty Edwards MRN: [id number] Date of Birth: Aug 08, 1926  Transition of Care [facility]) CM/SW [place], [place] Phone Number: 09/09/2018, 10:26 PM  Clinical Narrative:  [name] is an 85 YO female who comes to [place] following a fall at home, resulting in a hip fracture. Pt now presenting into ED after discharging home earlier this week. [name] reports that her goal is for Pt to discharge to SNF for short term rehab.   FL2 signed by EDP.   Pt also has a niece who is active with her care. [place] ph: [phone].   Family prefers the following SNF for placement: [facility], [place].     Expected Discharge Plan: Skilled Nursing Facility Barriers to Discharge: Ship broker  Expected Discharge Plan and Services Expected Discharge Plan: [place] In-house Referral: PCP / [facility] Discharge Planning Services: CM Consult Post Acute Care Choice: [place] Living arrangements for the past 2 months: Mobile Home                                       Social Determinants of Health (SDOH) Interventions    Readmission Risk Interventions No flowsheet data found.

## 2018-09-09 NOTE — ED Notes (Signed)
TTS machine at beside.

## 2018-09-09 NOTE — BH Assessment (Signed)
Tele Assessment Note   Patient Name: Misty Edwards MRN: [id number] Referring Physician: Merrily Pew, MD Location of Patient: [facility] Location of Provider: [place] is a 85 y.o. female widow who was voluntarily brought to [place] by EMS to be evaluated due to having an altered mental state. Pt stated "I fell the other day and broke a bone so I had to stay with my daughter until they found a Rehab center for me closer to home.  Yesterday, my daughter helped me get dressed and fixed my breakfast and I accidentally wet myself.  My daughter went outside and talked on the phone and I wet myself again.  My grandson think I got an UTI and should get it check out.".   Pt denies SI/HI/SA/A/V-hallucination.  Pt reports that she resided alone prior to the fall which requires 24/7 assistance.  Pt reports that she is temporarily living with her daughter, Misty Edwards until Rehab placement is found or in home assistance established.  Pt gave [facility] permission to contact her daughter, Misty Edwards to complete a Family Collateral and was able to provide the correct contact information.  Patient was wearing a hospital gown and appeared appropriately groomed.  Pt was alert throughout the assessment.  Patient made good eye contact and had normal psychomotor activity.  Patient spoke in a normal voice without pressured speech.  Pt's affect appeared euthymic. Pt's thought process was coherent and logical .  Pt presented with good insight and judgement.   Family Collateral, Misty Edwards, daughter [phone])  According to Misty Edwards, pt's daughter I'm not going to say my mother is losing her memory because she is not.  When pt came come home the other day, pt was fuzzy and confused.  Pt was not able to remember when she last took her medicine but who can?  Pt became a little hyper religious the other night, and was talking about going home to see the [place].  Pt  told me that we would be sad but we would get better.  Pt also said she wanted to live as long as her mother, which is 77 y/o or as long as the good Lord would allow her to live.  We don't want my mother (pt) to be in mental hospital we want her in a rehab center to get help while she recover.  We don't want my mom (pt) to be sent all the way to [place], [place] for rehab.  Is there any way that you can help [place] with finding a rehab close to home?   Disposition: [facility] discussed case with [place] Provider, Mordecai Maes, NP who stated Family Collateral needed to be gathered prior to disposition.  [facility] reconvene with [facility] Provider after family collateral at which time pt was psych cleared.  Diagnosis: F43.20 Adjustment Disorder, Unspecified   Past Medical History:  Past Medical History:  Diagnosis Date  . Acute biliary pancreatitis 07/2002   thia was in 07/2004:she still has pseudocyst in tail of pancreas measuring 54 x 33 mm   . Adrenal adenoma    bilateral  . Anxiety   . Chronic pancreatitis ([place])   . Detached retina   . Diabetes mellitus ([place])   . Diverticulosis   . History of carcinoma in situ of breast 1988  . Hyperlipidemia   . Hypertension   . IBS (irritable bowel syndrome)   . Legally blind   . Osteoarthritis   . Osteopenia   .  Upper GI bleed September2004   secondary to gastritis    Past Surgical History:  Procedure Laterality Date  . CARDIAC CATHETERIZATION N/A 08/24/2014   Procedure: Left Heart Cath and Coronary Angiography;  Surgeon: Leonie Man, MD;  Location: [place] [facility];  Service: Cardiovascular;  Laterality: N/A;  . COLONOSCOPY  08/15/05   few tiny diverticula at sigmoid colon/external hemorrhoids but no polyps  . COLONOSCOPY  01/08/2012   PPJ:KDTOIZT diverticulosis. Next colonoscopy in 12/2016  . CORONARY ARTERY BYPASS GRAFT N/A 08/24/2014   Procedure: CORONARY ARTERY BYPASS GRAFTING (CABG) x three, using left internal mammary artery and right leg greater  saphenous vein harvested endoscopically;  Surgeon: Ivin Poot, MD;  Location: [place];  Service: Open Heart Surgery;  Laterality: N/A;  . ECTOPIC PREGNANCY SURGERY  1950's  . ERCP with sphincterotomy  07/2002  . ESOPHAGOGASTRODUODENOSCOPY      Gastritis of body.  Otherwise normal  . ESOPHAGOGASTRODUODENOSCOPY  01/08/2012   RMR: Few scattered gastric erosions of uncertain significance-status post biopsy. Minimal chronic inflammation, no H.pylori  . LAPAROSCOPIC CHOLECYSTECTOMY    . PANCREATIC PSEUDOCYST DRAINAGE  [id number]   drained percutaneously   . right mastectomy    . tacking up of her bladder    . TEE WITHOUT CARDIOVERSION N/A 08/24/2014   Procedure: TRANSESOPHAGEAL ECHOCARDIOGRAM (TEE);  Surgeon: Ivin Poot, MD;  Location: [place];  Service: Open Heart Surgery;  Laterality: N/A;    Family History:  Family History  Problem Relation Age of Onset  . Ovarian cancer Mother        passed  . Colon cancer Father 72       passed away in his 38's  . Colon cancer Brother 33       surgery inhis 50's doing well now  . Heart attack Brother        passed away age 60  . Pancreatitis Brother   . Kidney disease Daughter   . Leukemia Son     Social History:  reports that she has never smoked. She has never used smokeless tobacco. She reports that she does not drink alcohol or use drugs.  Additional Social History:  Alcohol / Drug Use Pain Medications: See MARs Prescriptions: See MARs Over the Counter: See MARs History of alcohol / drug use?: No history of alcohol / drug abuse  CIWA: CIWA-Ar BP: (!) 165/63 Pulse Rate: 77 COWS:    Allergies:  Allergies  Allergen Reactions  . Calcium-Containing Compounds Nausea Only  . Morphine And Related Other (See Comments)    Hallucination  . Raloxifene Itching    Evista- Face and eyes burning  . Vitamin D Analogs Nausea Only    Home Medications: (Not in a hospital admission)   OB/GYN Status:  No LMP recorded. Patient is  postmenopausal.  General Assessment Data Location of Assessment: [facility] TTS Assessment: In system Is this a Tele or Face-to-Face Assessment?: Tele Assessment Is this an Initial Assessment or a Re-assessment for this encounter?: Initial Assessment Patient Accompanied by:: N/A Language Other than English: No Living Arrangements: Other (Comment)(Live daughter, Misty Edwards) What gender do you identify as?: Female Marital status: Widowed [place] name: Isley(Coe if first married surname) Living Arrangements: Children(Temporarily with daughter) Can pt return to current living arrangement?: Yes Admission Status: Voluntary Is patient capable of signing voluntary admission?: Yes Referral Source: Self/Family/Friend     Crisis Care Plan Living Arrangements: Children(Temporarily with daughter)     Risk to self with the past 6 months  Suicidal Ideation: No Has patient been a risk to self within the past 6 months prior to admission? : No Suicidal Intent: No Has patient had any suicidal intent within the past 6 months prior to admission? : No Is patient at risk for suicide?: No Suicidal Plan?: No Has patient had any suicidal plan within the past 6 months prior to admission? : No Access to Means: No Previous Attempts/Gestures: No Triggers for Past Attempts: None known Intentional Self Injurious Behavior: None Family Suicide History: No Persecutory voices/beliefs?: No Depression: Yes Substance abuse history and/or treatment for substance abuse?: No Suicide prevention information given to non-admitted patients: Not applicable  Risk to Others within the past 6 months Homicidal Ideation: No Does patient have any lifetime risk of violence toward others beyond the six months prior to admission? : No Thoughts of Harm to Others: No Current Homicidal Intent: No Current Homicidal Plan: No Access to Homicidal Means: No History of harm to others?: No Assessment of Violence: None Noted Does  patient have access to weapons?: No Criminal Charges Pending?: No Does patient have a court date: No Is patient on probation?: No  Psychosis Hallucinations: None noted Delusions: None noted  Mental Status Report Appearance/Hygiene: In hospital gown Eye Contact: Good Motor Activity: Freedom of movement Speech: Logical/coherent Level of Consciousness: Alert Mood: Pleasant Affect: Appropriate to circumstance Anxiety Level: None Thought Processes: Coherent, Relevant Judgement: Unimpaired Orientation: Person, Place, Time, Situation, Appropriate for developmental age Obsessive Compulsive Thoughts/Behaviors: None  Cognitive Functioning Concentration: Normal Memory: Recent Intact, Remote Intact Is patient IDD: No Insight: Good Impulse Control: Good Appetite: Fair Have you had any weight changes? : No Change Sleep: No Change Total Hours of Sleep: 6 Vegetative Symptoms: None  ADLScreening [facility] Assessment Services) Patient's cognitive ability adequate to safely complete daily activities?: Yes Patient able to express need for assistance with ADLs?: Yes Independently performs ADLs?: No  Prior Inpatient Therapy Prior Inpatient Therapy: No  Prior Outpatient Therapy Prior Outpatient Therapy: No Does patient have an ACCT team?: No Does patient have Intensive In-House Services?  : No Does patient have [facility] services? : No Does patient have P4CC services?: No  ADL Screening (condition at time of admission) Patient's cognitive ability adequate to safely complete daily activities?: Yes Is the patient deaf or have difficulty hearing?: No Does the patient have difficulty seeing, even when wearing glasses/contacts?: No Does the patient have difficulty concentrating, remembering, or making decisions?: No Patient able to express need for assistance with ADLs?: Yes Does the patient have difficulty dressing or bathing?: Yes Independently performs ADLs?: No Communication:  Independent Dressing (OT): Needs assistance(recently fell and broke a bone) Grooming: Independent Feeding: Independent Bathing: Needs assistance(recently fell and broke a bone) Toileting: Needs assistance(recently fell and broke a bone) In/Out Bed: Needs assistance(recently fell and broke a bone) Walks in Home: Needs assistance(recently fell and broke a bone) Does the patient have difficulty walking or climbing stairs?: Yes       Abuse/Neglect Assessment (Assessment to be complete while patient is alone) Abuse/Neglect Assessment Can Be Completed: Yes Physical Abuse: Denies Verbal Abuse: Denies Sexual Abuse: Denies Exploitation of patient/patient's resources: Denies Self-Neglect: Denies   Consults Social Work Consult Needed: Yes (Comment)(waiting on home health assistance) Regulatory affairs officer (For Healthcare) Does Patient Have a Medical Advance Directive?: No Would patient like information on creating a medical advance directive?: No - Patient declined          Disposition: [facility] discussed case with [place] Provider, Mordecai Maes, NP who stated Family  Collateral needed to be gathered prior to disposition.  [facility] Provider after family collateral at which time pt was psych cleared.   Disposition Initial Assessment Completed for this Encounter: Yes Disposition of Patient: (Pending Family Collateral per Mordecai Maes, NP)  This service was provided via telemedicine using a 2-way, interactive audio and video technology.  Names of all persons participating in this telemedicine service and their role in this encounter. Name: [name] Role: Patient  Name: Misty Edwards Role: Daughter  Name: Sylvester Harder, MS, [facility], [place] Role: Triage Specialist  Name: Mordecai Maes Role: [facility] Provider    Sylvester Harder, [place], [facility], [facility] 09/09/2018 10:51 AM

## 2018-09-09 NOTE — ED Provider Notes (Signed)
Patient CARE signed out to follow-up TTS assessment.  Patient's had mild confusion since being discharged from the hospital.  Patient sodium initially low, IV fluids given repeat sodium at patient's baseline 131.  Patient is alert and oriented she knows her name, date of birth, city, location.  Patient is well-appearing on exam.  Behavioral health recommendation pending.  No indication for medical admission at this time.  Care management recommending placement for patient care and safety given recent fall, fracture, hospitalization and difficulty caring for self at home.    Golda Acre, MD 09/11/18 [phone]

## 2018-09-10 LAB — CBG MONITORING, ED
Glucose-Capillary: 280 mg/dL — ABNORMAL HIGH (ref 70–99)
Glucose-Capillary: 341 mg/dL — ABNORMAL HIGH (ref 70–99)

## 2018-09-10 LAB — URINE CULTURE

## 2018-09-10 MED ORDER — MAGNESIUM OXIDE 400 (241.3 MG) MG PO TABS
400.0000 mg | ORAL_TABLET | Freq: Every day | ORAL | Status: DC
  Administered 2018-09-10 – 2018-09-23 (×14): 400 mg via ORAL
  Filled 2018-09-10 (×14): qty 1

## 2018-09-10 MED ORDER — SODIUM CHLORIDE 0.9 % IV BOLUS (SEPSIS)
1000.0000 mL | Freq: Once | INTRAVENOUS | Status: AC
  Administered 2018-09-11: 1000 mL via INTRAVENOUS

## 2018-09-10 NOTE — ED Notes (Signed)
Pt started on valtrex bid x 7 days on 08/31/18 . Lesions are dried on forehead. Valtrex dc per Dr Sabra Heck

## 2018-09-10 NOTE — Clinical Social Work Note (Addendum)
[facility] and [facility] have all declined patient.  Have called both [facility] and [facility], who are reviewing and will let me know today about bed offer or no.  4:20 [facility] declined.  [facility] made a bed offer.  Have tried to contact niece Jeani Sow, [phone] multiple times  to consult about plan, but mailbox is full so can't leave a message.  I asked Debbie at [facility] to go ahead and commence [employer] process.  As soon as she has authorization, pt will be able to transfer to [place].

## 2018-09-11 LAB — CBG MONITORING, ED: Glucose-Capillary: 202 mg/dL — ABNORMAL HIGH (ref 70–99)

## 2018-09-11 NOTE — ED Provider Notes (Addendum)
I was asked to evaluate the patient due to a drop in her blood pressure.  Patient is awake alert, no acute distress.  She denies any chest pain or shortness of breath. Repeat blood pressure is now above 100.  Repeat EKG is unchanged.  Labs have been reviewed.  Patient is awaiting placement.   EKG Interpretation  Date/Time:  Saturday September 11 2018 00:05:28 EDT Ventricular Rate:  67 PR Interval:    QRS Duration: 88 QT Interval:  426 QTC Calculation: 450 R Axis:   15 Text Interpretation:  Sinus rhythm Anterior infarct, old Confirmed by Ripley Fraise [phone]) on 09/11/2018 12:17:06 AM      Drop in BP likely due to her nightly hypertension medicines.  She will receive IV fluids if there is any further drops in blood pressure   Ripley Fraise, MD 09/11/18 [place]    Ripley Fraise, MD 09/11/18 [phone]

## 2018-09-11 NOTE — ED Notes (Signed)
Pt was given breakfast tray 

## 2018-09-12 ENCOUNTER — Other Ambulatory Visit: Payer: Self-pay | Admitting: Family Medicine

## 2018-09-12 LAB — CBG MONITORING, ED
Glucose-Capillary: 316 mg/dL — ABNORMAL HIGH (ref 70–99)
Glucose-Capillary: 436 mg/dL — ABNORMAL HIGH (ref 70–99)

## 2018-09-12 MED ORDER — INSULIN ASPART 100 UNIT/ML ~~LOC~~ SOLN
10.0000 [IU] | Freq: Once | SUBCUTANEOUS | Status: AC
  Administered 2018-09-12: 10 [IU] via SUBCUTANEOUS
  Filled 2018-09-12: qty 1

## 2018-09-12 NOTE — ED Notes (Signed)
Patient given lunch tray.

## 2018-09-13 ENCOUNTER — Encounter (HOSPITAL_COMMUNITY): Payer: Self-pay | Admitting: Family Medicine

## 2018-09-13 ENCOUNTER — Other Ambulatory Visit: Payer: Self-pay

## 2018-09-13 ENCOUNTER — Emergency Department (HOSPITAL_COMMUNITY): Payer: Medicare HMO

## 2018-09-13 ENCOUNTER — Other Ambulatory Visit: Payer: Self-pay | Admitting: Family Medicine

## 2018-09-13 DIAGNOSIS — E1151 Type 2 diabetes mellitus with diabetic peripheral angiopathy without gangrene: Secondary | ICD-10-CM | POA: Diagnosis not present

## 2018-09-13 DIAGNOSIS — R410 Disorientation, unspecified: Secondary | ICD-10-CM | POA: Diagnosis not present

## 2018-09-13 DIAGNOSIS — R0902 Hypoxemia: Secondary | ICD-10-CM | POA: Diagnosis not present

## 2018-09-13 DIAGNOSIS — R1312 Dysphagia, oropharyngeal phase: Secondary | ICD-10-CM | POA: Diagnosis not present

## 2018-09-13 DIAGNOSIS — Z888 Allergy status to other drugs, medicaments and biological substances status: Secondary | ICD-10-CM | POA: Diagnosis not present

## 2018-09-13 DIAGNOSIS — E860 Dehydration: Secondary | ICD-10-CM | POA: Diagnosis present

## 2018-09-13 DIAGNOSIS — I10 Essential (primary) hypertension: Secondary | ICD-10-CM

## 2018-09-13 DIAGNOSIS — M6281 Muscle weakness (generalized): Secondary | ICD-10-CM | POA: Diagnosis not present

## 2018-09-13 DIAGNOSIS — Z9181 History of falling: Secondary | ICD-10-CM | POA: Diagnosis not present

## 2018-09-13 DIAGNOSIS — Z951 Presence of aortocoronary bypass graft: Secondary | ICD-10-CM

## 2018-09-13 DIAGNOSIS — Z7401 Bed confinement status: Secondary | ICD-10-CM | POA: Diagnosis not present

## 2018-09-13 DIAGNOSIS — H548 Legal blindness, as defined in USA: Secondary | ICD-10-CM | POA: Diagnosis present

## 2018-09-13 DIAGNOSIS — N390 Urinary tract infection, site not specified: Secondary | ICD-10-CM | POA: Diagnosis not present

## 2018-09-13 DIAGNOSIS — E871 Hypo-osmolality and hyponatremia: Secondary | ICD-10-CM

## 2018-09-13 DIAGNOSIS — Z7189 Other specified counseling: Secondary | ICD-10-CM | POA: Diagnosis not present

## 2018-09-13 DIAGNOSIS — E43 Unspecified severe protein-calorie malnutrition: Secondary | ICD-10-CM | POA: Diagnosis not present

## 2018-09-13 DIAGNOSIS — N179 Acute kidney failure, unspecified: Secondary | ICD-10-CM | POA: Diagnosis not present

## 2018-09-13 DIAGNOSIS — Z1159 Encounter for screening for other viral diseases: Secondary | ICD-10-CM | POA: Diagnosis not present

## 2018-09-13 DIAGNOSIS — R2689 Other abnormalities of gait and mobility: Secondary | ICD-10-CM | POA: Diagnosis not present

## 2018-09-13 DIAGNOSIS — K861 Other chronic pancreatitis: Secondary | ICD-10-CM

## 2018-09-13 DIAGNOSIS — I2510 Atherosclerotic heart disease of native coronary artery without angina pectoris: Secondary | ICD-10-CM | POA: Diagnosis present

## 2018-09-13 DIAGNOSIS — K5900 Constipation, unspecified: Secondary | ICD-10-CM | POA: Diagnosis not present

## 2018-09-13 DIAGNOSIS — K581 Irritable bowel syndrome with constipation: Secondary | ICD-10-CM | POA: Diagnosis present

## 2018-09-13 DIAGNOSIS — R140 Abdominal distension (gaseous): Secondary | ICD-10-CM | POA: Diagnosis not present

## 2018-09-13 DIAGNOSIS — M1990 Unspecified osteoarthritis, unspecified site: Secondary | ICD-10-CM | POA: Diagnosis present

## 2018-09-13 DIAGNOSIS — E785 Hyperlipidemia, unspecified: Secondary | ICD-10-CM | POA: Diagnosis not present

## 2018-09-13 DIAGNOSIS — R41841 Cognitive communication deficit: Secondary | ICD-10-CM | POA: Diagnosis not present

## 2018-09-13 DIAGNOSIS — Z6824 Body mass index (BMI) 24.0-24.9, adult: Secondary | ICD-10-CM | POA: Diagnosis not present

## 2018-09-13 DIAGNOSIS — E1159 Type 2 diabetes mellitus with other circulatory complications: Secondary | ICD-10-CM

## 2018-09-13 DIAGNOSIS — S32401D Unspecified fracture of right acetabulum, subsequent encounter for fracture with routine healing: Secondary | ICD-10-CM | POA: Diagnosis not present

## 2018-09-13 DIAGNOSIS — K219 Gastro-esophageal reflux disease without esophagitis: Secondary | ICD-10-CM | POA: Diagnosis not present

## 2018-09-13 DIAGNOSIS — Z86000 Personal history of in-situ neoplasm of breast: Secondary | ICD-10-CM | POA: Diagnosis not present

## 2018-09-13 DIAGNOSIS — E1165 Type 2 diabetes mellitus with hyperglycemia: Secondary | ICD-10-CM | POA: Diagnosis present

## 2018-09-13 DIAGNOSIS — F419 Anxiety disorder, unspecified: Secondary | ICD-10-CM | POA: Diagnosis present

## 2018-09-13 DIAGNOSIS — Z515 Encounter for palliative care: Secondary | ICD-10-CM | POA: Diagnosis not present

## 2018-09-13 DIAGNOSIS — B9689 Other specified bacterial agents as the cause of diseases classified elsewhere: Secondary | ICD-10-CM | POA: Diagnosis not present

## 2018-09-13 DIAGNOSIS — N12 Tubulo-interstitial nephritis, not specified as acute or chronic: Secondary | ICD-10-CM | POA: Diagnosis present

## 2018-09-13 DIAGNOSIS — Z885 Allergy status to narcotic agent status: Secondary | ICD-10-CM | POA: Diagnosis not present

## 2018-09-13 DIAGNOSIS — I959 Hypotension, unspecified: Secondary | ICD-10-CM | POA: Diagnosis not present

## 2018-09-13 DIAGNOSIS — Z8249 Family history of ischemic heart disease and other diseases of the circulatory system: Secondary | ICD-10-CM | POA: Diagnosis not present

## 2018-09-13 LAB — BASIC METABOLIC PANEL
Anion gap: 12 (ref 5–15)
BUN: 33 mg/dL — ABNORMAL HIGH (ref 8–23)
CO2: 28 mmol/L (ref 22–32)
Calcium: 9.3 mg/dL (ref 8.9–10.3)
Chloride: 90 mmol/L — ABNORMAL LOW (ref 98–111)
Creatinine, Ser: 2.02 mg/dL — ABNORMAL HIGH (ref 0.44–1.00)
GFR calc Af Amer: 25 mL/min — ABNORMAL LOW (ref >60)
GFR calc non Af Amer: 21 mL/min — ABNORMAL LOW (ref >60)
Glucose, Bld: 157 mg/dL — ABNORMAL HIGH (ref 70–99)
Potassium: 4.4 mmol/L (ref 3.5–5.1)
Sodium: 130 mmol/L — ABNORMAL LOW (ref 135–145)

## 2018-09-13 LAB — CBC WITH DIFFERENTIAL/PLATELET
Abs Immature Granulocytes: 0.04 10*3/uL (ref 0.00–0.07)
Basophils Absolute: 0 10*3/uL (ref 0.0–0.1)
Basophils Relative: 0 %
Eosinophils Absolute: 0.1 10*3/uL (ref 0.0–0.5)
Eosinophils Relative: 1 %
HCT: 31.6 % — ABNORMAL LOW (ref 36.0–46.0)
Hemoglobin: 10.5 g/dL — ABNORMAL LOW (ref 12.0–15.0)
Immature Granulocytes: 0 %
Lymphocytes Relative: 18 %
Lymphs Abs: 1.8 10*3/uL (ref 0.7–4.0)
MCH: 29.2 pg (ref 26.0–34.0)
MCHC: 33.2 g/dL (ref 30.0–36.0)
MCV: 88 fL (ref 80.0–100.0)
Monocytes Absolute: 0.9 10*3/uL (ref 0.1–1.0)
Monocytes Relative: 9 %
Neutro Abs: 7.1 10*3/uL (ref 1.7–7.7)
Neutrophils Relative %: 72 %
Platelets: 196 10*3/uL (ref 150–400)
RBC: 3.59 MIL/uL — ABNORMAL LOW (ref 3.87–5.11)
RDW: 13.9 % (ref 11.5–15.5)
WBC: 10 10*3/uL (ref 4.0–10.5)
nRBC: 0 % (ref 0.0–0.2)

## 2018-09-13 LAB — LACTIC ACID, PLASMA
Lactic Acid, Venous: 2 mmol/L (ref 0.5–1.9)
Lactic Acid, Venous: 1.5 mmol/L (ref 0.5–1.9)

## 2018-09-13 LAB — URINALYSIS, ROUTINE W REFLEX MICROSCOPIC
Bacteria, UA: NONE SEEN
Bilirubin Urine: NEGATIVE
Glucose, UA: >=500 mg/dL — AB
Hgb urine dipstick: NEGATIVE
Ketones, ur: NEGATIVE mg/dL
Nitrite: NEGATIVE
Protein, ur: NEGATIVE mg/dL
Specific Gravity, Urine: 1.02 (ref 1.005–1.030)
pH: 7 (ref 5.0–8.0)

## 2018-09-13 LAB — CBG MONITORING, ED
Glucose-Capillary: 213 mg/dL — ABNORMAL HIGH (ref 70–99)
Glucose-Capillary: 167 mg/dL — ABNORMAL HIGH (ref 70–99)

## 2018-09-13 LAB — TROPONIN I (HIGH SENSITIVITY)
Troponin I (High Sensitivity): 32 ng/L — ABNORMAL HIGH (ref <18)
Troponin I (High Sensitivity): 30 ng/L — ABNORMAL HIGH (ref <18)

## 2018-09-13 LAB — GLUCOSE, CAPILLARY
Glucose-Capillary: 258 mg/dL — ABNORMAL HIGH (ref 70–99)
Glucose-Capillary: 212 mg/dL — ABNORMAL HIGH (ref 70–99)

## 2018-09-13 LAB — VITAMIN B12: Vitamin B-12: 582 pg/mL (ref 180–914)

## 2018-09-13 MED ORDER — LORAZEPAM 0.5 MG PO TABS: 0.5000 mg | ORAL_TABLET | Freq: Every evening | ORAL | Status: DC | PRN

## 2018-09-13 MED ORDER — INSULIN ASPART PROT & ASPART (70-30 MIX) 100 UNIT/ML ~~LOC~~ SUSP
20.0000 [IU] | Freq: Every day | SUBCUTANEOUS | Status: DC
  Filled 2018-09-13: qty 10

## 2018-09-13 MED ORDER — SODIUM CHLORIDE 0.9 % IV BOLUS
1000.0000 mL | Freq: Once | INTRAVENOUS | Status: AC
  Administered 2018-09-13: 1000 mL via INTRAVENOUS

## 2018-09-13 MED ORDER — ENOXAPARIN SODIUM 30 MG/0.3ML ~~LOC~~ SOLN
30.0000 mg | SUBCUTANEOUS | Status: DC
  Administered 2018-09-13 – 2018-09-14 (×2): 30 mg via SUBCUTANEOUS
  Filled 2018-09-13 (×2): qty 0.3

## 2018-09-13 MED ORDER — SIMETHICONE 80 MG PO CHEW: 120.0000 mg | CHEWABLE_TABLET | Freq: Two times a day (BID) | ORAL | Status: DC | PRN

## 2018-09-13 MED ORDER — HYDRALAZINE HCL 20 MG/ML IJ SOLN
10.0000 mg | INTRAMUSCULAR | Status: DC | PRN
  Administered 2018-09-14 – 2018-09-18 (×3): 10 mg via INTRAVENOUS
  Filled 2018-09-13 (×4): qty 1

## 2018-09-13 MED ORDER — INSULIN ASPART PROT & ASPART (70-30 MIX) 100 UNIT/ML ~~LOC~~ SUSP
35.0000 [IU] | Freq: Every day | SUBCUTANEOUS | Status: DC
  Filled 2018-09-13: qty 10

## 2018-09-13 MED ORDER — SODIUM CHLORIDE 0.9 % IV SOLN
1.0000 g | Freq: Once | INTRAVENOUS | Status: AC
  Administered 2018-09-13: 12:00:00 1 g via INTRAVENOUS
  Filled 2018-09-13: qty 10

## 2018-09-13 MED ORDER — LORATADINE 10 MG PO TABS: 10.0000 mg | ORAL_TABLET | Freq: Every day | ORAL | Status: DC | PRN

## 2018-09-13 MED ORDER — INSULIN ASPART 100 UNIT/ML ~~LOC~~ SOLN
3.0000 [IU] | Freq: Three times a day (TID) | SUBCUTANEOUS | Status: DC
  Administered 2018-09-13: 3 [IU] via SUBCUTANEOUS

## 2018-09-13 MED ORDER — ONDANSETRON HCL 4 MG/2ML IJ SOLN: 4.0000 mg | Freq: Four times a day (QID) | INTRAMUSCULAR | Status: DC | PRN

## 2018-09-13 MED ORDER — LINAGLIPTIN 5 MG PO TABS
5.0000 mg | ORAL_TABLET | Freq: Every day | ORAL | Status: DC
  Administered 2018-09-13 – 2018-09-23 (×11): 5 mg via ORAL
  Filled 2018-09-13 (×11): qty 1

## 2018-09-13 MED ORDER — INSULIN ASPART PROT & ASPART (70-30 MIX) 100 UNIT/ML ~~LOC~~ SUSP: 30.0000 [IU] | Freq: Every day | SUBCUTANEOUS | Status: DC

## 2018-09-13 MED ORDER — SODIUM CHLORIDE 0.9 % IV SOLN
INTRAVENOUS | Status: DC
  Administered 2018-09-13: 16:00:00 via INTRAVENOUS

## 2018-09-13 MED ORDER — INSULIN ASPART 100 UNIT/ML ~~LOC~~ SOLN
0.0000 [IU] | Freq: Three times a day (TID) | SUBCUTANEOUS | Status: DC
  Administered 2018-09-13: 2 [IU] via SUBCUTANEOUS
  Administered 2018-09-13: 5 [IU] via SUBCUTANEOUS
  Administered 2018-09-13: 3 [IU] via SUBCUTANEOUS
  Administered 2018-09-14: 7 [IU] via SUBCUTANEOUS
  Administered 2018-09-14: 9 [IU] via SUBCUTANEOUS
  Administered 2018-09-14: 5 [IU] via SUBCUTANEOUS
  Filled 2018-09-13 (×2): qty 1

## 2018-09-13 MED ORDER — ONDANSETRON HCL 4 MG PO TABS: 4.0000 mg | ORAL_TABLET | Freq: Four times a day (QID) | ORAL | Status: DC | PRN

## 2018-09-13 MED ORDER — ACETAMINOPHEN 650 MG RE SUPP: 650.0000 mg | Freq: Four times a day (QID) | RECTAL | Status: DC | PRN

## 2018-09-13 MED ORDER — NEOMYCIN-POLYMYXIN-DEXAMETH 3.5-10000-0.1 OP OINT
TOPICAL_OINTMENT | Freq: Three times a day (TID) | OPHTHALMIC | Status: DC
  Filled 2018-09-13: qty 3.5

## 2018-09-13 MED ORDER — SODIUM CHLORIDE 0.9 % IV SOLN
1.0000 g | INTRAVENOUS | Status: DC
  Administered 2018-09-14 – 2018-09-15 (×2): 1 g via INTRAVENOUS
  Filled 2018-09-13 (×2): qty 10

## 2018-09-13 MED ORDER — ACETAMINOPHEN 325 MG PO TABS
650.0000 mg | ORAL_TABLET | Freq: Four times a day (QID) | ORAL | Status: DC | PRN
  Filled 2018-09-13: qty 2

## 2018-09-13 MED ORDER — INSULIN GLARGINE 100 UNIT/ML ~~LOC~~ SOLN
15.0000 [IU] | Freq: Every day | SUBCUTANEOUS | Status: DC
  Administered 2018-09-13: 15 [IU] via SUBCUTANEOUS
  Filled 2018-09-13: qty 0.15

## 2018-09-13 MED ORDER — NEOMYCIN-POLYMYXIN-DEXAMETH 3.5-10000-0.1 OP SUSP
1.0000 [drp] | Freq: Three times a day (TID) | OPHTHALMIC | Status: DC
  Administered 2018-09-13 – 2018-09-23 (×30): 1 [drp] via OPHTHALMIC
  Filled 2018-09-13 (×3): qty 5

## 2018-09-13 NOTE — ED Notes (Signed)
Pt noted to be more lethargic today than yesterday. Will awaken and talk, but falls back asleep and has not been alert since shift started. MD aware.

## 2018-09-13 NOTE — ED Notes (Addendum)
Spoke with pt's granddaughter Jeani Sow who has been handling decisions during this hospital stay. She reports pt has no confusion at baseline and family has noticed subtle changes over the phone since Friday that staff here may not have noticed. She reports pt has no disorientation and has lived independently until her hip fracture. Updated on condition today and medical admission status.

## 2018-09-13 NOTE — H&P (Addendum)
History and Physical  Misty Edwards HHI:[id number] DOB: Jun 05, 1926 DOA: 09/08/2018  Referring physician: Regenia Skeeter, MD  PCP: Susy Frizzle, MD   Chief Complaint: altered mental status   HPI: Misty Edwards is a 85 y.o. female who had been recently discharged on 09/06/2018 after having been admitted with syncope and collapse, right acetabular fracture, acute kidney injury and hypertension.  She has chronic pancreatitis.  She has had difficulties with eating and drinking and severe protein calorie malnutrition.  She was discharged home however she did not do very well at home.  She ended up being brought back to the emergency department because she was talking out of her head, intermittently confused, she has been seen by the psychiatric service in the ED who recommended rehab placement.  The patient was waiting in the ED for rehab placement and has been in the ED for the past 3 days.  Unfortunately the patient has not been eating and drinking well in the ED.  She initially had normal labs when she presented to the ED however with her poor oral intake, confusion and had been taking her blood pressure medications which included lisinopril she is now in a state of acute kidney injury, dehydration and altered mental status.  She is more confused in the last 24 hours.  She is more lethargic in the last 24 hours.  The ED doctors evaluated her got repeat labs and noticed that her renal function has precipitously declined in the past 3 days.  Her creatinine went from 0.89 to now greater than 2.0.  She has clinical signs of dehydration.  She had a normal urinalysis 3 days ago and repeat urinalysis today with findings of infection.  Pt was having intermittent episodes of hypotension in ED. Pt has insulin dependent diabetes and has been intermittently hyperglycemic.  Pt was started on IV fluids and IV ceftriaxone and admission was requested.    Review of Systems: UTO due to confusion  Past Medical History:   Diagnosis Date  . Acute biliary pancreatitis 07/2002   thia was in 07/2004:she still has pseudocyst in tail of pancreas measuring 54 x 33 mm   . Adrenal adenoma    bilateral  . Anxiety   . Chronic pancreatitis ([place])   . Detached retina   . Diabetes mellitus ([place])   . Diverticulosis   . History of carcinoma in situ of breast 1988  . Hyperlipidemia   . Hypertension   . IBS (irritable bowel syndrome)   . Legally blind   . Osteoarthritis   . Osteopenia   . Upper GI bleed September2004   secondary to gastritis   Past Surgical History:  Procedure Laterality Date  . CARDIAC CATHETERIZATION N/A 08/24/2014   Procedure: Left Heart Cath and Coronary Angiography;  Surgeon: Leonie Man, MD;  Location: [facility];  Service: Cardiovascular;  Laterality: N/A;  . COLONOSCOPY  08/15/05   few tiny diverticula at sigmoid colon/external hemorrhoids but no polyps  . COLONOSCOPY  01/08/2012   BOE:RQSXQKS diverticulosis. Next colonoscopy in 12/2016  . CORONARY ARTERY BYPASS GRAFT N/A 08/24/2014   Procedure: CORONARY ARTERY BYPASS GRAFTING (CABG) x three, using left internal mammary artery and right leg greater saphenous vein harvested endoscopically;  Surgeon: Ivin Poot, MD;  Location: [facility];  Service: Open Heart Surgery;  Laterality: N/A;  . ECTOPIC PREGNANCY SURGERY  1950's  . ERCP with sphincterotomy  07/2002  . ESOPHAGOGASTRODUODENOSCOPY      Gastritis of body.  Otherwise normal  . ESOPHAGOGASTRODUODENOSCOPY  01/08/2012   RMR: Few scattered gastric erosions of uncertain significance-status post biopsy. Minimal chronic inflammation, no H.pylori  . LAPAROSCOPIC CHOLECYSTECTOMY    . PANCREATIC PSEUDOCYST DRAINAGE  [id number]   drained percutaneously   . right mastectomy    . tacking up of her bladder    . TEE WITHOUT CARDIOVERSION N/A 08/24/2014   Procedure: TRANSESOPHAGEAL ECHOCARDIOGRAM (TEE);  Surgeon: Ivin Poot, MD;  Location: [place] [facility];  Service: Open Heart Surgery;  Laterality: N/A;    Social History:  reports that she has never smoked. She has never used smokeless tobacco. She reports that she does not drink alcohol or use drugs.  Allergies  Allergen Reactions  . Calcium-Containing Compounds Nausea Only  . Morphine And Related Other (See Comments)    Hallucination  . Raloxifene Itching    Evista- Face and eyes burning  . Vitamin D Analogs Nausea Only    Family History  Problem Relation Age of Onset  . Ovarian cancer Mother        passed  . Colon cancer Father 74       passed away in his 84's  . Colon cancer Brother 17       surgery inhis 50's doing well now  . Heart attack Brother        passed away age 38  . Pancreatitis Brother   . Kidney disease Daughter   . Leukemia Son     Prior to Admission medications   Medication Sig Start Date End Date Taking? Authorizing Provider  amLODipine (NORVASC) 5 MG tablet TAKE 1 TABLET BY MOUTH ONCE DAILY 04/12/18  Yes Branch, Alphonse Guild, MD  aspirin EC 81 MG EC tablet Take 1 tablet (81 mg total) by mouth daily. 08/31/14  Yes Gold, Wayne E, PA-C  Blood Glucose Monitoring Suppl (ONE TOUCH ULTRA 2) w/Device KIT Use as directed to monitor FSBS 3x daily. Dx: E11.9. 07/13/18  Yes Susy Frizzle, MD  brimonidine-timolol (COMBIGAN) 0.2-0.5 % ophthalmic solution Place 1 drop into both eyes every 12 (twelve) hours.   Yes [provider]  CREON 24000-76000 units CPEP TAKE 2 CAPSULES BY MOUTH WITH MEALS AND 1 CAP WITH SNACKS x 2 Patient taking differently: Take 1-2 capsules by mouth 5 (five) times daily. Patient takes 2 capsules with meals and 1 capsule with snacks 05/19/18  Yes Susy Frizzle, MD  cycloSPORINE (RESTASIS) 0.05 % ophthalmic emulsion Place 1 drop into both eyes 3 (three) times daily as needed.    Yes [provider]  feeding supplement, ENSURE ENLIVE, (ENSURE ENLIVE) LIQD Take 237 mLs by mouth 2 (two) times daily between meals. 09/07/18  Yes Lavina Hamman, MD  ferrous sulfate 325 (65 FE) MG tablet  Take 325 mg by mouth daily with breakfast.   Yes [provider]  FLUoxetine (PROZAC) 20 MG tablet TAKE 1 TABLET BY MOUTH ONCE DAILY 02/15/18  Yes Susy Frizzle, MD  glucose blood (ONE TOUCH ULTRA TEST) test strip Use as directed to monitor FSBS 5 x daily. Dx: E11.9. 08/18/18  Yes Susy Frizzle, MD  insulin NPH-regular Human (NOVOLIN 70/30) (70-30) 100 UNIT/ML injection 25 units q am and 15 units q pm Patient taking differently: Inject 15-25 Units into the skin 2 (two) times daily with a meal. Patient takes 25 units in the morning and 15 units in the evening 06/03/18  Yes Susy Frizzle, MD  Insulin Syringes, Disposable, U-100 1 ML MISC 1/2 inch 31 g  05/11/17  Yes Susy Frizzle, MD  lisinopril (ZESTRIL) 2.5 MG tablet Take 1 tablet by mouth once daily Patient taking differently: Take 2.5 mg by mouth daily as needed.  08/03/18  Yes Branch, Alphonse Guild, MD  LORazepam (ATIVAN) 0.5 MG tablet TAKE 1 OR 2 TABLETS BY MOUTH AT BEDTIME AS NEEDED FOR  INSOMNIA Patient taking differently: Take 0.5-1 mg by mouth at bedtime as needed.  08/30/18  Yes Susy Frizzle, MD  Magnesium Oxide 400 (240 Mg) MG TABS TAKE 1 TABLET BY MOUTH ONCE DAILY 03/24/18  Yes Branch, Alphonse Guild, MD  metFORMIN (GLUCOPHAGE) 1000 MG tablet Take 500 mg by mouth 2 (two) times daily with a meal.   Yes [provider]  metoprolol tartrate (LOPRESSOR) 25 MG tablet Take 1 tablet by mouth twice daily 07/07/18  Yes Susy Frizzle, MD  Multiple Vitamin (MULTIVITAMIN) capsule Take 1 capsule by mouth daily.   Yes [provider]  neomycin-polymyxin b-dexamethasone (MAXITROL) 3.5-10000-0.1 OINT APPLY OINTMENT INTO LEFT EYE THREE TIMES DAILY AS DIRECTED 09/02/18  Yes [provider]  OneTouch Delica Lancets 67J MISC Use as directed to monitor FSBS 3x daily. Dx: E11.9. 07/13/18  Yes Susy Frizzle, MD  pantoprazole (PROTONIX) 40 MG tablet Take 1 tablet by mouth once daily 08/03/18  Yes Branch, Alphonse Guild, MD   RELION INSULIN SYR 0.5ML/31G 31G X 5/16" 0.5 ML MISC USE AS DIRECTED 04/05/18  Yes [place], Modena Nunnery, MD  RELION INSULIN SYRINGE 1ML/31G 31G X 5/16" 1 ML MISC  05/11/17  Yes [provider]  rosuvastatin (CRESTOR) 5 MG tablet TAKE 1 TABLET BY MOUTH ONCE DAILY 02/22/18  Yes Branch, Alphonse Guild, MD  Simethicone (GAS-X EXTRA STRENGTH) 125 MG CAPS Take 125 mg by mouth 2 (two) times daily as needed (GAS).   Yes [provider]  triamcinolone cream (KENALOG) 0.1 % APPLY  CREAM EXTERNALLY TWICE DAILY Patient taking differently: as needed.  03/02/17  Yes Susy Frizzle, MD  Wheat Dextrin (BENEFIBER DRINK MIX) PACK Take 1 Package by mouth daily.   Yes [provider]  EQ ALLERGY RELIEF, CETIRIZINE, 10 MG tablet Take 1 tablet by mouth once daily 09/13/18   Susy Frizzle, MD   Physical Exam: Vitals:   09/13/18 1051 09/13/18 1121 09/13/18 1200 09/13/18 1300  BP: (!) 72/43 (!) 121/55 (!) 86/39 131/60  Pulse: 65 73 73 80  Resp: 18 (!) '27 18 20  ' Temp:      TempSrc:      SpO2: 100% 97% 99% 100%     General exam: Moderately built and nourished patient, lying comfortably supine on the gurney in no obvious distress.  Pt is lethargic but arousable.  No active herpes zoster lesions seen on face. Healed old lesions seen.   Head, eyes and ENT: Nontraumatic and normocephalic. Pupils equally reacting to light and accommodation. Oral mucosa dry.  Neck: Supple. No JVD, carotid bruit or thyromegaly.  Lymphatics: No lymphadenopathy.  Respiratory system: Clear to auscultation. No increased work of breathing.  Cardiovascular system: S1 and S2 heard, RRR. No JVD, murmurs, gallops, clicks or pedal edema.  Gastrointestinal system: Abdomen is nondistended, soft and nontender. Normal bowel sounds heard. No organomegaly or masses appreciated.  Central nervous system: Alert and lethargic with confusion. No focal neurological deficits.  Extremities: Symmetric 5 x 5 power. Peripheral  pulses symmetrically felt.   Skin: No rashes or acute findings.  Musculoskeletal system: Negative exam.  Psychiatry: Pleasant and cooperative.  Labs on Admission:  Basic Metabolic Panel: Recent Labs  Lab 09/08/18 2344 09/09/18 0652 09/13/18 1151  NA 127* 131* 130*  K 3.7 4.1 4.4  CL 89* 93* 90*  CO2 '25 26 28  ' GLUCOSE 258* 207* 157*  BUN 21 20 33*  CREATININE 0.90 0.86 2.02*  CALCIUM 8.9 8.6* 9.3   Liver Function Tests: Recent Labs  Lab 09/08/18 2344  AST 26  ALT 27  ALKPHOS 45  BILITOT 1.1  PROT 7.2  ALBUMIN 3.7   No results for input(s): LIPASE, AMYLASE in the last 168 hours. No results for input(s): AMMONIA in the last 168 hours. CBC: Recent Labs  Lab 09/08/18 2344 09/13/18 1151  WBC 7.2 10.0  NEUTROABS  --  7.1  HGB 11.8* 10.5*  HCT 35.4* 31.6*  MCV 85.7 88.0  PLT 166 196   Cardiac Enzymes: No results for input(s): CKTOTAL, CKMB, CKMBINDEX, TROPONINI in the last 168 hours.  BNP (last 3 results) No results for input(s): PROBNP in the last 8760 hours. CBG: Recent Labs  Lab 09/11/18 0858 09/12/18 0816 09/12/18 1741 09/13/18 0741 09/13/18 1137  GLUCAP 202* 316* 436* 213* 167*    Radiological Exams on Admission: Dg Chest Portable 1 View  Result Date: 09/13/2018 CLINICAL DATA:  Weakness and hypoxia. EXAM: PORTABLE CHEST 1 VIEW COMPARISON:  Chest x-ray dated September 09, 2018. FINDINGS: The heart size and mediastinal contours are within normal limits. Prior CABG. Atherosclerotic calcification of the aortic arch. Normal pulmonary vascularity. No focal consolidation, pleural effusion, or pneumothorax. No acute osseous abnormality. Right axillary surgical clips again noted. IMPRESSION: No active disease. Electronically Signed   By: Titus Dubin M.D.   On: 09/13/2018 11:47   Assessment/Plan Principal Problem:   AKI (acute kidney injury) ([place]) Active Problems:   Essential hypertension   GERD   Chronic pancreatitis ([place])   UTI (urinary tract  infection)   S/P CABG x 3   Dyslipidemia   Type 2 diabetes mellitus with vascular disease (HCC)   Protein-calorie malnutrition, severe   Hyponatremia   1. AKI -this is prerenal as the patient has not been drinking or eating well.  We will treat this with gentle IV fluid hydration.  Discontinue lisinopril at this time.  Follow BMP.  Renally dose medications as appropriate. 2. UTI- urine culture and blood cultures pending.  Continue ceftriaxone IV. 3. Poorly controlled type 2 diabetes mellitus, insulin requiring -we are going to hold the 70/30 insulin due to her acute renal insufficiency and continue sliding scale coverage and hold metformin.  Monitor CBG 5 times per day.  Provide sliding scale coverage and prandial coverage for meals.  Add basal as needed. 4. Severe protein calorie malnutrition-unfortunately patient is not eating and drinking enough to sustain her body's basic needs.  Because of this I am going to consult palliative medicine to help [place] with goals of care. 5. Hyponatremia-secondary to dehydration, treating with IV fluids and follow BMP. 6. CAD status post CABG- stable no chest pain symptoms and troponin not suggestive of acute ischemia. 7. Recent acetabular fracture-patient was seen by orthopedics and weightbearing as tolerated and pain management. 8. Chronic pancreatitis- continue Creon with meals.  9. Essential hypertension - Hold home meds for now, IV hydralazine ordered as needed.     DVT Prophylaxis: lovenox  Code Status: Full   Family Communication: updated/telephone  Disposition Plan:  Inpatient for IV antibiotics, IV fluids    Time spent: 55 minutes   Irwin Brakeman, MD [facility] How to contact the [facility] Attending  or Consulting provider [place] or covering provider during after hours [place], for this patient?  1. Check the care team in [facility] and look for a) attending/consulting [facility] provider listed and b) the [facility] team listed 2. Log into [email or web address] and use  [place] universal password to access. If you do not have the password, please contact the hospital operator. 3. Locate the [facility] provider you are looking for under [facility] and page to a number that you can be directly reached. 4. If you still have difficulty reaching the provider, please page the [facility] (Director on Call) for the Hospitalists listed on amion for assistance.

## 2018-09-13 NOTE — Progress Notes (Addendum)
Inpatient Diabetes Program Recommendations  AACE/ADA: New Consensus Statement on Inpatient Glycemic Control   Target Ranges:  Prepandial:   less than 140 mg/dL      Peak postprandial:   less than 180 mg/dL (1-2 hours)      Critically ill patients:  140 - 180 mg/dL   Results for [name], [name] (MRN [id number]) as of 09/13/2018 09:18  Ref. Range 09/12/2018 08:16 09/12/2018 17:41 09/13/2018 07:41  Glucose-Capillary Latest Ref Range: 70 - 99 mg/dL 316 (H) 436 (H) 213 (H)   Review of Glycemic Control  Diabetes history: DM2 Outpatient Diabetes medications:  70/30 25 units QAM, 70/30 15 units QPM, Metformin 500 mg BID Current orders for Inpatient glycemic control: 70/30 25 units QAM with breakfast, 70/30 15 units with supper, Novolog 0-9 units TID with meals  Inpatient Diabetes Program Recommendations:   Insulin - Basal: Please consider increasing 70/30 to 35 units QAM with breakfast and 70/30 20 units with supper.  Thanks, Barnie Alderman, RN, MSN, CDE Diabetes Coordinator Inpatient Diabetes Program [phone] (Team Pager from 8am to 5pm)

## 2018-09-13 NOTE — ED Provider Notes (Signed)
10:55 AM I was asked to see patient due to low blood pressure.  BP is in the 70s.  She is asleep but easily arousable.  She states she feels generally weak but has no other complaints.  Otherwise her vitals are benign.  Rectal temp was 99.8.  Will screen with labs, urine, chest x-ray and ECG.  Will give IV fluids.  Her blood pressure medicines were held this morning.  Was last given amlodipine and lisinopril at about noon yesterday.  1:43 PM Patient's BP has responded to fluids.  However her lab work is concerning for an acute kidney injury with a creatinine of 2, previously was less than 1.  She also has a urine that is now showing UTI which previously it was not.  She has been given IV Rocephin.  Lactate will be sent and she will need admission for fluids and supportive care.  Discussed with Dr. Wynetta Emery who will admit.  CRITICAL CARE Performed by: Ephraim Hamburger   Total critical care time: 30 minutes  Critical care time was exclusive of separately billable procedures and treating other patients.  Critical care was necessary to treat or prevent imminent or life-threatening deterioration.  Critical care was time spent personally by me on the following activities: development of treatment plan with patient and/or surrogate as well as nursing, discussions with consultants, evaluation of patient's response to treatment, examination of patient, obtaining history from patient or surrogate, ordering and performing treatments and interventions, ordering and review of laboratory studies, ordering and review of radiographic studies, pulse oximetry and re-evaluation of patient's condition.   Results for orders placed or performed during the hospital encounter of 09/08/18  Urine culture   Specimen: Urine, Clean Catch  Result Value Ref Range   Specimen Description      URINE, CLEAN CATCH Performed at [facility], [address] [place], [place] [address]    Special Requests      NONE Performed at  [facility], [address], [place], [place] [address]    Culture MULTIPLE SPECIES PRESENT, SUGGEST RECOLLECTION (A)    Report Status 09/10/2018 FINAL   SARS Coronavirus 2 (CEPHEID - Performed in [place] hospital lab), [facility] Order   Specimen: Nasopharyngeal Swab  Result Value Ref Range   SARS Coronavirus 2 NEGATIVE NEGATIVE  Comprehensive metabolic panel  Result Value Ref Range   Sodium 127 (L) 135 - 145 mmol/L   Potassium 3.7 3.5 - 5.1 mmol/L   Chloride 89 (L) 98 - 111 mmol/L   CO2 25 22 - 32 mmol/L   Glucose, Bld 258 (H) 70 - 99 mg/dL   BUN 21 8 - 23 mg/dL   Creatinine, Ser 0.90 0.44 - 1.00 mg/dL   Calcium 8.9 8.9 - 10.3 mg/dL   Total Protein 7.2 6.5 - 8.1 g/dL   Albumin 3.7 3.5 - 5.0 g/dL   AST 26 15 - 41 U/L   ALT 27 0 - 44 U/L   Alkaline Phosphatase 45 38 - 126 U/L   Total Bilirubin 1.1 0.3 - 1.2 mg/dL   GFR calc non Af Amer 57 (L) >60 mL/min   GFR calc Af Amer >60 >60 mL/min   Anion gap 13 5 - 15  CBC  Result Value Ref Range   WBC 7.2 4.0 - 10.5 K/uL   RBC 4.13 3.87 - 5.11 MIL/uL   Hemoglobin 11.8 (L) 12.0 - 15.0 g/dL   HCT 35.4 (L) 36.0 - 46.0 %   MCV 85.7 80.0 - 100.0  fL   MCH 28.6 26.0 - 34.0 pg   MCHC 33.3 30.0 - 36.0 g/dL   RDW 13.4 11.5 - 15.5 %   Platelets 166 150 - 400 K/uL   nRBC 0.0 0.0 - 0.2 %  Urinalysis, Routine w reflex microscopic  Result Value Ref Range   Color, Urine STRAW (A) YELLOW   APPearance CLEAR CLEAR   Specific Gravity, Urine 1.002 (L) 1.005 - 1.030   pH 7.0 5.0 - 8.0   Glucose, UA >=500 (A) NEGATIVE mg/dL   Hgb urine dipstick NEGATIVE NEGATIVE   Bilirubin Urine NEGATIVE NEGATIVE   Ketones, ur NEGATIVE NEGATIVE mg/dL   Protein, ur NEGATIVE NEGATIVE mg/dL   Nitrite NEGATIVE NEGATIVE   Leukocytes,Ua NEGATIVE NEGATIVE   WBC, UA 0-5 0 - 5 WBC/hpf   Bacteria, UA RARE (A) NONE SEEN  Basic metabolic panel  Result Value Ref Range   Sodium 131 (L) 135 - 145 mmol/L   Potassium 4.1 3.5 - 5.1 mmol/L   Chloride 93 (L) 98 - 111 mmol/L    CO2 26 22 - 32 mmol/L   Glucose, Bld 207 (H) 70 - 99 mg/dL   BUN 20 8 - 23 mg/dL   Creatinine, Ser 0.86 0.44 - 1.00 mg/dL   Calcium 8.6 (L) 8.9 - 10.3 mg/dL   GFR calc non Af Amer 60 (L) >60 mL/min   GFR calc Af Amer >60 >60 mL/min   Anion gap 12 5 - 15  Basic metabolic panel  Result Value Ref Range   Sodium 130 (L) 135 - 145 mmol/L   Potassium 4.4 3.5 - 5.1 mmol/L   Chloride 90 (L) 98 - 111 mmol/L   CO2 28 22 - 32 mmol/L   Glucose, Bld 157 (H) 70 - 99 mg/dL   BUN 33 (H) 8 - 23 mg/dL   Creatinine, Ser 2.02 (H) 0.44 - 1.00 mg/dL   Calcium 9.3 8.9 - 10.3 mg/dL   GFR calc non Af Amer 21 (L) >60 mL/min   GFR calc Af Amer 25 (L) >60 mL/min   Anion gap 12 5 - 15  CBC with Differential  Result Value Ref Range   WBC 10.0 4.0 - 10.5 K/uL   RBC 3.59 (L) 3.87 - 5.11 MIL/uL   Hemoglobin 10.5 (L) 12.0 - 15.0 g/dL   HCT 31.6 (L) 36.0 - 46.0 %   MCV 88.0 80.0 - 100.0 fL   MCH 29.2 26.0 - 34.0 pg   MCHC 33.2 30.0 - 36.0 g/dL   RDW 13.9 11.5 - 15.5 %   Platelets 196 150 - 400 K/uL   nRBC 0.0 0.0 - 0.2 %   Neutrophils Relative % 72 %   Neutro Abs 7.1 1.7 - 7.7 K/uL   Lymphocytes Relative 18 %   Lymphs Abs 1.8 0.7 - 4.0 K/uL   Monocytes Relative 9 %   Monocytes Absolute 0.9 0.1 - 1.0 K/uL   Eosinophils Relative 1 %   Eosinophils Absolute 0.1 0.0 - 0.5 K/uL   Basophils Relative 0 %   Basophils Absolute 0.0 0.0 - 0.1 K/uL   Immature Granulocytes 0 %   Abs Immature Granulocytes 0.04 0.00 - 0.07 K/uL  Urinalysis, Routine w reflex microscopic  Result Value Ref Range   Color, Urine YELLOW YELLOW   APPearance HAZY (A) CLEAR   Specific Gravity, Urine 1.020 1.005 - 1.030   pH 7.0 5.0 - 8.0   Glucose, UA >=500 (A) NEGATIVE mg/dL   Hgb urine dipstick NEGATIVE NEGATIVE  Bilirubin Urine NEGATIVE NEGATIVE   Ketones, ur NEGATIVE NEGATIVE mg/dL   Protein, ur NEGATIVE NEGATIVE mg/dL   Nitrite NEGATIVE NEGATIVE   Leukocytes,Ua LARGE (A) NEGATIVE   RBC / HPF 0-5 0 - 5 RBC/hpf   WBC, UA 21-50 0  - 5 WBC/hpf   Bacteria, UA NONE SEEN NONE SEEN   Squamous Epithelial / LPF 0-5 0 - 5   Mucus PRESENT   Troponin I (High Sensitivity)  Result Value Ref Range   Troponin I (High Sensitivity) 32.00 (H) <18 ng/L  CBG monitoring, ED  Result Value Ref Range   Glucose-Capillary 253 (H) 70 - 99 mg/dL  CBG monitoring, ED  Result Value Ref Range   Glucose-Capillary 215 (H) 70 - 99 mg/dL   Comment 1 Notify RN    Comment 2 Document in Chart   CBG monitoring, ED  Result Value Ref Range   Glucose-Capillary 271 (H) 70 - 99 mg/dL  CBG monitoring, ED  Result Value Ref Range   Glucose-Capillary 280 (H) 70 - 99 mg/dL  CBG monitoring, ED  Result Value Ref Range   Glucose-Capillary 341 (H) 70 - 99 mg/dL  CBG monitoring, ED  Result Value Ref Range   Glucose-Capillary 202 (H) 70 - 99 mg/dL  CBG monitoring, ED  Result Value Ref Range   Glucose-Capillary 316 (H) 70 - 99 mg/dL  CBG monitoring, ED  Result Value Ref Range   Glucose-Capillary 436 (H) 70 - 99 mg/dL  CBG monitoring, ED  Result Value Ref Range   Glucose-Capillary 213 (H) 70 - 99 mg/dL  CBG monitoring, ED  Result Value Ref Range   Glucose-Capillary 167 (H) 70 - 99 mg/dL   Dg Chest 2 View  Result Date: 09/09/2018 CLINICAL DATA:  Altered mental status EXAM: CHEST - 2 VIEW COMPARISON:  10/23/2016 FINDINGS: The lungs are hyperinflated with diffuse interstitial prominence. No focal airspace consolidation or pulmonary edema. No pleural effusion or pneumothorax. Normal cardiomediastinal contours. There are right axillary surgical clips. Remote median sternotomy and CABG. IMPRESSION: COPD without acute airspace disease. Electronically Signed   By: Ulyses Jarred M.D.   On: 09/09/2018 02:26   Dg Pelvis 1-2 Views  Result Date: 09/03/2018 CLINICAL DATA:  Fall.  Pain EXAM: PELVIS - 1-2 VIEW COMPARISON:  CT 09/03/2018. FINDINGS: Complex right acetabular fracture noted. Slight angulation deformity present. Diffuse osteopenia. Degenerative changes  lumbar spine and both hips. Aortoiliac and peripheral atherosclerotic vascular calcification. Pelvic calcifications consistent phleboliths. IMPRESSION: 1. Complex right acetabular fracture with slight angulation deformity. 2.  Diffuse osteopenia and degenerative change. 3.  Aortoiliac and peripheral vascular disease. Electronically Signed   By: Marcello Moores  Register   On: 09/03/2018 06:08   Ct Head Wo Contrast  Result Date: 09/09/2018 CLINICAL DATA:  Confusion EXAM: CT HEAD WITHOUT CONTRAST TECHNIQUE: Contiguous axial images were obtained from the base of the skull through the vertex without intravenous contrast. COMPARISON:  CT head dated September 03, 2018 FINDINGS: Brain: No evidence of acute infarction, hemorrhage, hydrocephalus, extra-axial collection or mass lesion/mass effect. Volume loss and chronic microvascular ischemic changes are again noted. Vascular: No hyperdense vessel or unexpected calcification. Skull: Normal. Negative for fracture or focal lesion. Sinuses/Orbits: No acute finding. The patient is status post prior cataract surgery on the right. Other: None. IMPRESSION: No acute intracranial abnormality. Electronically Signed   By: Constance Holster M.D.   On: 09/09/2018 02:11   Ct Head Wo Contrast  Result Date: 09/03/2018 CLINICAL DATA:  Minor head trauma.  Fall going to bathroom.  EXAM: CT HEAD WITHOUT CONTRAST CT CERVICAL SPINE WITHOUT CONTRAST TECHNIQUE: Multidetector CT imaging of the head and cervical spine was performed following the standard protocol without intravenous contrast. Multiplanar CT image reconstructions of the cervical spine were also generated. COMPARISON:  None. FINDINGS: CT HEAD FINDINGS Brain: No evidence of acute infarction, hemorrhage, hydrocephalus, extra-axial collection or mass lesion/mass effect. Perforator infarct at the right basal ganglia, age indeterminate by imaging but chronic based on the history. Age congruent cerebral volume loss. Vascular: Atherosclerotic  calcification Skull: Negative for fracture Sinuses/Orbits: No evidence of injury.  Right cataract resection. CT CERVICAL SPINE FINDINGS Alignment: No traumatic malalignment Skull base and vertebrae: Negative for acute fracture. Atlantooccipital non segmentation. Soft tissues and spinal canal: No prevertebral fluid or swelling. No visible canal hematoma. Multinodular thyroid that is partially covered. No invasive features. Disc levels: Generalized degenerative facet spurring and disc narrowing. C4-5 ACDF with solid arthrodesis Upper chest: Negative IMPRESSION: No evidence of acute intracranial or cervical spine injury. Electronically Signed   By: Monte Fantasia M.D.   On: 09/03/2018 06:24   Ct Cervical Spine Wo Contrast  Result Date: 09/03/2018 CLINICAL DATA:  Minor head trauma.  Fall going to bathroom. EXAM: CT HEAD WITHOUT CONTRAST CT CERVICAL SPINE WITHOUT CONTRAST TECHNIQUE: Multidetector CT imaging of the head and cervical spine was performed following the standard protocol without intravenous contrast. Multiplanar CT image reconstructions of the cervical spine were also generated. COMPARISON:  None. FINDINGS: CT HEAD FINDINGS Brain: No evidence of acute infarction, hemorrhage, hydrocephalus, extra-axial collection or mass lesion/mass effect. Perforator infarct at the right basal ganglia, age indeterminate by imaging but chronic based on the history. Age congruent cerebral volume loss. Vascular: Atherosclerotic calcification Skull: Negative for fracture Sinuses/Orbits: No evidence of injury.  Right cataract resection. CT CERVICAL SPINE FINDINGS Alignment: No traumatic malalignment Skull base and vertebrae: Negative for acute fracture. Atlantooccipital non segmentation. Soft tissues and spinal canal: No prevertebral fluid or swelling. No visible canal hematoma. Multinodular thyroid that is partially covered. No invasive features. Disc levels: Generalized degenerative facet spurring and disc narrowing. C4-5  ACDF with solid arthrodesis Upper chest: Negative IMPRESSION: No evidence of acute intracranial or cervical spine injury. Electronically Signed   By: Monte Fantasia M.D.   On: 09/03/2018 06:24   Ct Pelvis Wo Contrast  Result Date: 09/03/2018 CLINICAL DATA:  Fall going to bathroom. Unable to lift right leg due to pain. EXAM: CT PELVIS WITHOUT CONTRAST TECHNIQUE: Multidetector CT imaging of the pelvis was performed following the standard protocol without intravenous contrast. COMPARISON:  09/26/2016 abdominal CT FINDINGS: Musculoskeletal: Branching fracture in the right acetabulum extending inferiorly along the puboacetabular junction. No inferior ramus or iliac wing fracture. Right hip joint effusion and small extraperitoneal pelvic hematoma. Femur is located and intact. Incidental advanced L4-5 spinal stenosis from posterior element hypertrophy and disc bulge. Urinary Tract:  No abnormality visualized. Bowel:  Unremarkable visualized pelvic bowel loops. Vascular/Lymphatic: Atherosclerosis. Reproductive:  Unremarkable for age IMPRESSION: 1. Nondisplaced, comminuted right acetabulum fracture without inferior ramus or iliac wing involvement. 2. Small extraperitoneal pelvic hematoma. Electronically Signed   By: Monte Fantasia M.D.   On: 09/03/2018 06:30   Dg Chest Portable 1 View  Result Date: 09/13/2018 CLINICAL DATA:  Weakness and hypoxia. EXAM: PORTABLE CHEST 1 VIEW COMPARISON:  Chest x-ray dated September 09, 2018. FINDINGS: The heart size and mediastinal contours are within normal limits. Prior CABG. Atherosclerotic calcification of the aortic arch. Normal pulmonary vascularity. No focal consolidation, pleural effusion, or pneumothorax. No acute  osseous abnormality. Right axillary surgical clips again noted. IMPRESSION: No active disease. Electronically Signed   By: Titus Dubin M.D.   On: 09/13/2018 11:47   Dg Femur Min 2 Views Right  Result Date: 09/03/2018 CLINICAL DATA:  Fall. EXAM: RIGHT FEMUR 2  VIEWS COMPARISON:  AP pelvis 09/03/2018.  CT pelvis 09/03/2018. FINDINGS: Right femur is intact. No evidence of femoral fracture or dislocation complex right acetabular fracture again noted. Peripheral vascular calcification. IMPRESSION: 1. Right femur is intact. No evidence of femoral fracture or dislocation. 2.  Complex right acetabular fracture again noted. 3.  Peripheral vascular disease. Electronically Signed   By: Marcello Moores  Register   On: 09/03/2018 06:11      Sherwood Gambler, MD 09/13/18 1344

## 2018-09-13 NOTE — ED Notes (Signed)
Pt repositioned and rolled on left side and supported with pillow.

## 2018-09-13 NOTE — Clinical Social Work Note (Addendum)
Spoke with niece, Angelina Ok, [phone], and updated her on [facility] plan.  She did not refuse to send patient there, but did request that I try one more place that she had previously rejected, [facility] in [place].  Have sent them patient's information.  Told niece that if they offer bed, we could pursue them for placement.  Otherwise, patient will go to [facility] when authorized.  Ms Ahmed Prima told me that when she talked to patient over weekend, patient informed her that she was coming to stay with her at d/c from hospital.  Ms Ahmed Prima stated she gently reminded patient that plan is for her to go into rehab placement.  [facility] declined-not in network with insurance.  Let niece know.  Full speed ahead with [facility].

## 2018-09-14 ENCOUNTER — Inpatient Hospital Stay (HOSPITAL_COMMUNITY): Payer: Medicare HMO

## 2018-09-14 DIAGNOSIS — K5900 Constipation, unspecified: Secondary | ICD-10-CM

## 2018-09-14 DIAGNOSIS — R410 Disorientation, unspecified: Secondary | ICD-10-CM

## 2018-09-14 DIAGNOSIS — N390 Urinary tract infection, site not specified: Secondary | ICD-10-CM

## 2018-09-14 DIAGNOSIS — Z7189 Other specified counseling: Secondary | ICD-10-CM

## 2018-09-14 DIAGNOSIS — Z515 Encounter for palliative care: Secondary | ICD-10-CM

## 2018-09-14 LAB — CBC
HCT: 33.1 % — ABNORMAL LOW (ref 36.0–46.0)
Hemoglobin: 10.8 g/dL — ABNORMAL LOW (ref 12.0–15.0)
MCH: 29 pg (ref 26.0–34.0)
MCHC: 32.6 g/dL (ref 30.0–36.0)
MCV: 89 fL (ref 80.0–100.0)
Platelets: 175 10*3/uL (ref 150–400)
RBC: 3.72 MIL/uL — ABNORMAL LOW (ref 3.87–5.11)
RDW: 13.6 % (ref 11.5–15.5)
WBC: 7.7 10*3/uL (ref 4.0–10.5)
nRBC: 0 % (ref 0.0–0.2)

## 2018-09-14 LAB — GLUCOSE, CAPILLARY
Glucose-Capillary: 261 mg/dL — ABNORMAL HIGH (ref 70–99)
Glucose-Capillary: 336 mg/dL — ABNORMAL HIGH (ref 70–99)
Glucose-Capillary: 414 mg/dL — ABNORMAL HIGH (ref 70–99)
Glucose-Capillary: 245 mg/dL — ABNORMAL HIGH (ref 70–99)

## 2018-09-14 LAB — BASIC METABOLIC PANEL
Anion gap: 10 (ref 5–15)
BUN: 29 mg/dL — ABNORMAL HIGH (ref 8–23)
CO2: 27 mmol/L (ref 22–32)
Calcium: 8.8 mg/dL — ABNORMAL LOW (ref 8.9–10.3)
Chloride: 94 mmol/L — ABNORMAL LOW (ref 98–111)
Creatinine, Ser: 1.22 mg/dL — ABNORMAL HIGH (ref 0.44–1.00)
GFR calc Af Amer: 45 mL/min — ABNORMAL LOW (ref >60)
GFR calc non Af Amer: 39 mL/min — ABNORMAL LOW (ref >60)
Glucose, Bld: 217 mg/dL — ABNORMAL HIGH (ref 70–99)
Potassium: 4.2 mmol/L (ref 3.5–5.1)
Sodium: 131 mmol/L — ABNORMAL LOW (ref 135–145)

## 2018-09-14 LAB — MAGNESIUM: Magnesium: 2 mg/dL (ref 1.7–2.4)

## 2018-09-14 MED ORDER — AMLODIPINE BESYLATE 5 MG PO TABS
5.0000 mg | ORAL_TABLET | Freq: Every day | ORAL | Status: DC
  Administered 2018-09-14 – 2018-09-23 (×10): 5 mg via ORAL
  Filled 2018-09-14 (×10): qty 1

## 2018-09-14 MED ORDER — INSULIN ASPART 100 UNIT/ML ~~LOC~~ SOLN
6.0000 [IU] | Freq: Three times a day (TID) | SUBCUTANEOUS | Status: DC
  Administered 2018-09-14: 6 [IU] via SUBCUTANEOUS

## 2018-09-14 MED ORDER — POLYETHYLENE GLYCOL 3350 17 G PO PACK
17.0000 g | PACK | Freq: Every day | ORAL | Status: DC
  Administered 2018-09-14 – 2018-09-23 (×10): 17 g via ORAL
  Filled 2018-09-14 (×10): qty 1

## 2018-09-14 MED ORDER — BISACODYL 10 MG RE SUPP: 10.0000 mg | Freq: Every day | RECTAL | Status: DC | PRN

## 2018-09-14 MED ORDER — INSULIN ASPART 100 UNIT/ML ~~LOC~~ SOLN
0.0000 [IU] | Freq: Every day | SUBCUTANEOUS | Status: DC
  Administered 2018-09-14 – 2018-09-17 (×2): 2 [IU] via SUBCUTANEOUS

## 2018-09-14 MED ORDER — INSULIN ASPART 100 UNIT/ML ~~LOC~~ SOLN
8.0000 [IU] | Freq: Three times a day (TID) | SUBCUTANEOUS | Status: DC
  Administered 2018-09-15 – 2018-09-23 (×19): 8 [IU] via SUBCUTANEOUS

## 2018-09-14 MED ORDER — INSULIN ASPART 100 UNIT/ML ~~LOC~~ SOLN
0.0000 [IU] | Freq: Three times a day (TID) | SUBCUTANEOUS | Status: DC
  Administered 2018-09-15: 2 [IU] via SUBCUTANEOUS
  Administered 2018-09-15 (×2): 5 [IU] via SUBCUTANEOUS
  Administered 2018-09-16: 3 [IU] via SUBCUTANEOUS
  Administered 2018-09-16 (×2): 5 [IU] via SUBCUTANEOUS
  Administered 2018-09-17: 2 [IU] via SUBCUTANEOUS
  Administered 2018-09-17 – 2018-09-18 (×3): 5 [IU] via SUBCUTANEOUS
  Administered 2018-09-18: 13:00:00 8 [IU] via SUBCUTANEOUS
  Administered 2018-09-19 – 2018-09-20 (×2): 3 [IU] via SUBCUTANEOUS
  Administered 2018-09-20 – 2018-09-21 (×2): 5 [IU] via SUBCUTANEOUS
  Administered 2018-09-21: 3 [IU] via SUBCUTANEOUS
  Administered 2018-09-22: 18:00:00 5 [IU] via SUBCUTANEOUS
  Administered 2018-09-22 (×2): 3 [IU] via SUBCUTANEOUS
  Administered 2018-09-23: 5 [IU] via SUBCUTANEOUS

## 2018-09-14 MED ORDER — INSULIN ASPART 100 UNIT/ML ~~LOC~~ SOLN
5.0000 [IU] | Freq: Once | SUBCUTANEOUS | Status: AC
  Administered 2018-09-14: 5 [IU] via SUBCUTANEOUS

## 2018-09-14 MED ORDER — BISACODYL 10 MG RE SUPP
10.0000 mg | Freq: Once | RECTAL | Status: AC
  Administered 2018-09-14: 10 mg via RECTAL
  Filled 2018-09-14: qty 1

## 2018-09-14 MED ORDER — INSULIN GLARGINE 100 UNIT/ML ~~LOC~~ SOLN
24.0000 [IU] | Freq: Every day | SUBCUTANEOUS | Status: DC
  Administered 2018-09-14 – 2018-09-22 (×9): 24 [IU] via SUBCUTANEOUS
  Filled 2018-09-14 (×10): qty 0.24

## 2018-09-14 MED ORDER — GLUCERNA SHAKE PO LIQD
237.0000 mL | Freq: Three times a day (TID) | ORAL | Status: DC
  Administered 2018-09-14 – 2018-09-23 (×24): 237 mL via ORAL

## 2018-09-14 MED ORDER — INSULIN ASPART 100 UNIT/ML ~~LOC~~ SOLN
4.0000 [IU] | Freq: Three times a day (TID) | SUBCUTANEOUS | Status: DC
  Administered 2018-09-14: 4 [IU] via SUBCUTANEOUS

## 2018-09-14 MED ORDER — INSULIN GLARGINE 100 UNIT/ML ~~LOC~~ SOLN
18.0000 [IU] | Freq: Every day | SUBCUTANEOUS | Status: DC
  Filled 2018-09-14: qty 0.18

## 2018-09-14 MED ORDER — ACETAMINOPHEN 325 MG PO TABS
650.0000 mg | ORAL_TABLET | Freq: Three times a day (TID) | ORAL | Status: DC
  Administered 2018-09-14 – 2018-09-23 (×27): 650 mg via ORAL
  Filled 2018-09-14 (×26): qty 2

## 2018-09-14 NOTE — Progress Notes (Signed)
CRITICAL VALUE ALERT  Critical Value: blood sugar 414  Date & Time Notied:  09/14/2018  Provider Notified: yes  Orders Received/Actions taken:yes

## 2018-09-14 NOTE — Plan of Care (Signed)
  Problem: Acute Rehab PT Goals(only PT should resolve) Goal: Pt Will Go Supine/Side To Sit Outcome: Progressing Flowsheets (Taken 09/14/2018 0900) Pt will go Supine/Side to Sit: with min guard assist Goal: Patient Will Transfer Sit To/From Stand Outcome: Progressing Flowsheets (Taken 09/14/2018 0900) Patient will transfer sit to/from stand: with minimal assist Goal: Pt Will Transfer Bed To Chair/Chair To Bed Outcome: Progressing Flowsheets (Taken 09/14/2018 0900) Pt will Transfer Bed to Chair/Chair to Bed:  with min assist  min guard assist Goal: Pt Will Ambulate Outcome: Progressing Flowsheets (Taken 09/14/2018 0900) Pt will Ambulate:  50 feet  with min guard assist  with minimal assist  with rolling walker   9:02 AM, 09/14/18 Lonell Grandchild, MPT Physical Therapist with [facility] [phone] office [phone] mobile phone

## 2018-09-14 NOTE — Progress Notes (Addendum)
PROGRESS NOTE    Misty Edwards  WPY:[id number]  DOB: 1926/04/06  DOA: 09/08/2018 PCP: Susy Frizzle, MD   Brief Admission Hx: 85 y.o. female who had been recently discharged on 09/06/2018 after having been admitted with syncope and collapse, right acetabular fracture, acute kidney injury and hypertension.  She has chronic pancreatitis.  She has had difficulties with eating and drinking and severe protein calorie malnutrition.  She was discharged home however she did not do very well at home.  She ended up being brought back to the emergency department because she was talking out of her head, intermittently confused, she has been seen by the psychiatric service in the ED who recommended rehab placement.  The patient was waiting in the ED for rehab placement and has been in the ED for the past 3 days.  Unfortunately the patient has not been eating and drinking well in the ED.  She initially had normal labs when she presented to the ED however with her poor oral intake, confusion and had been taking her blood pressure medications which included lisinopril she is now in a state of acute kidney injury, dehydration and altered mental status.   MDM/Assessment & Plan:   1. AKI - prerenal / dehydration - she is being treated with gentle hydration and the renal function is slowly improving.  I have discontinued lisinopril temporarily while her renal function is recovering. 2. UTI- follow urine and blood cultures.  Growth to date.  Continue IV ceftriaxone for at least 3 doses. 3. Poorly controlled type 2 diabetes mellitus, insulin requiring -her diabetes has been very brittle and because of this I have discontinued the 70/30 insulin in the setting of acute renal insufficiency to try and avoid hypoglycemia.  She has been started on Lantus and prandial NovoLog and we are titrating the doses.  Because she is discharging to SNF we likely can continue this as she is at lower risk of hypoglycemia on a basal  bolus regimen.  She does not eat well or consistently and that makes using 70/30 insulin dangerous in this elderly patient.  I am willing to tolerate hyperglycemia in this patient but cannot tolerate hypoglycemia.  Continue CBG testing 5 times per day. 4. Constipation- patient informed dietitian Nate that she cannot remember last time she had a bowel movement.  He notified me and we got an x-ray that shows that she does have a large amount of stool in the rectal vault.  We are going to give her a suppository and make sure she is disimpacted and keep her on scheduled MiraLAX. 5. Severe protein calorie malnutrition- this is being addressed with her in the hospital.  Please see dietitian recommendations. 6. Hyponatremia-secondary to dehydration and treating with IV fluids, follow BMP. 7. CAD status post CABG- this has been stable recently with no chest pain symptoms and her troponin was not suggestive of acute ischemia. 8. Recent acetabular fracture-she has been seen by orthopedics and weightbearing as tolerated and pain management recommended.  PT consult requested.  SNF placement for short-term rehab recommended.  The [facility] team is working on it. 9. Chronic pancreatitis-she remains stable if she continues Creon with meals. 10. Essential hypertension- we have resumed amlodipine 5 mg daily, IV hydralazine ordered as needed and would adjust treatment as needed.  DVT prophylaxis: Lovenox Code Status: Full Family Communication: attempted to contact debra primary decision maker, no answer and no answer service to leave message, spoke with grandson Juliane Lack and provided updates, he  works nights and prefers we Higher education careers adviser with updates ongoing... Disposition Plan: Continue inpatient care for IV fluids and IV antibiotics, hopeful to discharge to rehab in the next 24 hours   Consultants:    Procedures:    Antimicrobials:  Ceftriaxone 6/29 >>   Subjective: Pt says she still has no pain in her hip.   She remembers me from yesterday and she has no specific complaints.  She is working with physical therapy.  Objective: Vitals:   09/13/18 2024 09/13/18 2155 09/14/18 0507 09/14/18 0800  BP:  (!) 156/60 (!) 193/73 (!) 188/73  Pulse:  73 80 83  Resp:   17   Temp:  97.6 F (36.4 C) 97.7 F (36.5 C) 97.6 F (36.4 C)  TempSrc:  Oral Oral Oral  SpO2: 98% 100% 100% 97%  Weight:      Height:        Intake/Output Summary (Last 24 hours) at 09/14/2018 1413 Last data filed at 09/14/2018 0500 Gross per 24 hour  Intake 873.79 ml  Output 700 ml  Net 173.79 ml   Filed Weights   09/13/18 1506  Weight: 64.9 kg   REVIEW OF SYSTEMS  As per history otherwise all reviewed and reported negative  Exam:  General exam: elderly female, awake, sitting up in bed, oriented x3, NAD. Cooperative.  Respiratory system: BBS clear No increased work of breathing. Cardiovascular system: S1 & S2 heard. No JVD, murmurs, gallops, clicks or pedal edema. Gastrointestinal system: Abdomen is mildly distended, soft and nontender. Normal bowel sounds heard. Central nervous system: Alert and oriented. No focal neurological deficits. Extremities: diffuse arthritic changes, no CCE.  Data Reviewed: Basic Metabolic Panel: Recent Labs  Lab 09/08/18 2344 09/09/18 0652 09/13/18 1151 09/14/18 0454  NA 127* 131* 130* 131*  K 3.7 4.1 4.4 4.2  CL 89* 93* 90* 94*  CO2 25 26 28 27   GLUCOSE 258* 207* 157* 217*  BUN 21 20 33* 29*  CREATININE 0.90 0.86 2.02* 1.22*  CALCIUM 8.9 8.6* 9.3 8.8*  MG  --   --   --  2.0   Liver Function Tests: Recent Labs  Lab 09/08/18 2344  AST 26  ALT 27  ALKPHOS 45  BILITOT 1.1  PROT 7.2  ALBUMIN 3.7   No results for input(s): LIPASE, AMYLASE in the last 168 hours. No results for input(s): AMMONIA in the last 168 hours. CBC: Recent Labs  Lab 09/08/18 2344 09/13/18 1151 09/14/18 0454  WBC 7.2 10.0 7.7  NEUTROABS  --  7.1  --   HGB 11.8* 10.5* 10.8*  HCT 35.4* 31.6* 33.1*   MCV 85.7 88.0 89.0  PLT 166 196 175   Cardiac Enzymes: No results for input(s): CKTOTAL, CKMB, CKMBINDEX, TROPONINI in the last 168 hours. CBG (last 3)  Recent Labs    09/13/18 2154 09/14/18 0729 09/14/18 1128  GLUCAP 212* 261* 336*   Recent Results (from the past 240 hour(s))  Urine culture     Status: Abnormal   Collection Time: 09/09/18 12:30 AM   Specimen: Urine, Clean Catch  Result Value Ref Range Status   Specimen Description   Final    URINE, CLEAN CATCH Performed at [facility], [address] [place], [place] [address]    Special Requests   Final    NONE Performed at [facility], [address] [place], [place] [address]    Culture MULTIPLE SPECIES PRESENT, SUGGEST RECOLLECTION (A)  Final   Report Status 09/10/2018 FINAL  Final  SARS  Coronavirus 2 (CEPHEID - Performed in [place] hospital lab), Hosp Order     Status: None   Collection Time: 09/09/18  5:06 PM   Specimen: Nasopharyngeal Swab  Result Value Ref Range Status   SARS Coronavirus 2 NEGATIVE NEGATIVE Final    Comment: (NOTE) If result is NEGATIVE SARS-CoV-2 target nucleic acids are NOT DETECTED. The SARS-CoV-2 RNA is generally detectable in upper and lower  respiratory specimens during the acute phase of infection. The lowest  concentration of SARS-CoV-2 viral copies this assay can detect is 250  copies / mL. A negative result does not preclude SARS-CoV-2 infection  and should not be used as the sole basis for treatment or other  patient management decisions.  A negative result may occur with  improper specimen collection / handling, submission of specimen other  than nasopharyngeal swab, presence of viral mutation(s) within the  areas targeted by this assay, and inadequate number of viral copies  (<250 copies / mL). A negative result must be combined with clinical  observations, patient history, and epidemiological information. If result is POSITIVE SARS-CoV-2 target nucleic acids are DETECTED.  The SARS-CoV-2 RNA is generally detectable in upper and lower  respiratory specimens dur ing the acute phase of infection.  Positive  results are indicative of active infection with SARS-CoV-2.  Clinical  correlation with patient history and other diagnostic information is  necessary to determine patient infection status.  Positive results do  not rule out bacterial infection or co-infection with other viruses. If result is PRESUMPTIVE POSTIVE SARS-CoV-2 nucleic acids MAY BE PRESENT.   A presumptive positive result was obtained on the submitted specimen  and confirmed on repeat testing.  While 2019 novel coronavirus  (SARS-CoV-2) nucleic acids may be present in the submitted sample  additional confirmatory testing may be necessary for epidemiological  and / or clinical management purposes  to differentiate between  SARS-CoV-2 and other Sarbecovirus currently known to infect humans.  If clinically indicated additional testing with an alternate test  methodology [phone]) is advised. The SARS-CoV-2 RNA is generally  detectable in upper and lower respiratory sp ecimens during the acute  phase of infection. The expected result is Negative. Fact Sheet for Patients:  StrictlyIdeas.no Fact Sheet for Healthcare Providers: BankingDealers.co.za This test is not yet approved or cleared by the [place] FDA and has been authorized for detection and/or diagnosis of SARS-CoV-2 by FDA under an Emergency Use Authorization (EUA).  This EUA will remain in effect (meaning this test can be used) for the duration of the COVID-19 declaration under Section 564(b)(1) of the Act, 21 U.S.C. section 360bbb-3(b)(1), unless the authorization is terminated or revoked sooner. Performed at [facility], [address], [place], [place] [address]   Urine culture     Status: Abnormal (Preliminary result)   Collection Time: 09/13/18 10:55 AM   Specimen: Urine, Clean  Catch  Result Value Ref Range Status   Specimen Description   Final    URINE, CLEAN CATCH Performed at [facility], [address], [place], [place] [address]    Special Requests   Final    NONE Performed at [facility], [address] [place], [place] [address]    Culture >=100,000 COLONIES/mL GRAM NEGATIVE RODS (A)  Final   Report Status PENDING  Incomplete  Culture, blood (routine x 2)     Status: None (Preliminary result)   Collection Time: 09/13/18  1:38 PM   Specimen: BLOOD LEFT FOREARM  Result Value Ref Range Status  Specimen Description BLOOD LEFT FOREARM  Final   Special Requests   Final    BOTTLES DRAWN AEROBIC AND ANAEROBIC Blood Culture adequate volume   Culture   Final    NO GROWTH < 24 HOURS Performed at [facility], [address], [place], [place] [address]    Report Status PENDING  Incomplete  Culture, blood (Routine X 2) w Reflex to ID Panel     Status: None (Preliminary result)   Collection Time: 09/13/18  2:48 PM   Specimen: BLOOD LEFT ARM  Result Value Ref Range Status   Specimen Description BLOOD LEFT ARM  Final   Special Requests   Final    BOTTLES DRAWN AEROBIC ONLY Blood Culture adequate volume   Culture   Final    NO GROWTH < 24 HOURS Performed at [facility], [address] [place], [place] [address]    Report Status PENDING  Incomplete     Studies: Dg Chest Portable 1 View  Result Date: 09/13/2018 CLINICAL DATA:  Weakness and hypoxia. EXAM: PORTABLE CHEST 1 VIEW COMPARISON:  Chest x-ray dated September 09, 2018. FINDINGS: The heart size and mediastinal contours are within normal limits. Prior CABG. Atherosclerotic calcification of the aortic arch. Normal pulmonary vascularity. No focal consolidation, pleural effusion, or pneumothorax. No acute osseous abnormality. Right axillary surgical clips again noted. IMPRESSION: No active disease. Electronically Signed   By: Titus Dubin M.D.   On: 09/13/2018 11:47   Dg Abd Portable 2v  Result Date:  09/14/2018 CLINICAL DATA:  Constipation, abdominal distension EXAM: PORTABLE ABDOMEN - 2 VIEW COMPARISON:  Pelvic radiograph 09/03/2018 FINDINGS: Increased stool in rectum. Mild gaseous distention of stomach. Remaining bowel gas pattern normal without wall thickening or evidence of obstruction. Lung bases clear. Postsurgical changes of cholecystectomy, CABG and RIGHT axillary node dissection. IMPRESSION: Increased stool in rectum. Nonobstructive bowel gas pattern. Electronically Signed   By: Lavonia Dana M.D.   On: 09/14/2018 14:00   Scheduled Meds: . acetaminophen  650 mg Oral Q8H  . amLODipine  5 mg Oral Daily  . aspirin  81 mg Oral Daily  . bisacodyl  10 mg Rectal Once  . brimonidine  1 drop Both Eyes BID  . enoxaparin (LOVENOX) injection  30 mg Subcutaneous Q24H  . feeding supplement (GLUCERNA SHAKE)  237 mL Oral TID BM  . ferrous sulfate  325 mg Oral Q breakfast  . FLUoxetine  20 mg Oral Daily  . insulin aspart  0-9 Units Subcutaneous TID WC  . insulin aspart  6 Units Subcutaneous TID WC  . insulin glargine  18 Units Subcutaneous QHS  . linagliptin  5 mg Oral Daily  . lipase/protease/amylase  24,000 Units Oral 5 X Daily  . magnesium oxide  400 mg Oral Daily  . multivitamin with minerals  1 tablet Oral Daily  . neomycin-polymyxin b-dexamethasone  1 drop Left Eye TID  . pantoprazole  40 mg Oral Daily  . polyethylene glycol  17 g Oral Daily  . rosuvastatin  5 mg Oral Daily  . timolol  1 drop Both Eyes BID   Continuous Infusions: . sodium chloride 50 mL/hr at 09/13/18 1618  . cefTRIAXone (ROCEPHIN)  IV 1 g (09/14/18 1212)    Principal Problem:   AKI (acute kidney injury) ([place]) Active Problems:   Essential hypertension   GERD   Chronic pancreatitis ([place])   UTI (urinary tract infection)   S/P CABG x 3   Dyslipidemia   Type 2 diabetes mellitus with vascular disease (  [place])   Protein-calorie malnutrition, severe   Hyponatremia   Palliative care by specialist   Advanced care  planning/counseling discussion   Goals of care, counseling/discussion  Time spent:   Irwin Brakeman, MD [facility] 09/14/2018, 2:13 PM    LOS: 1 day  How to contact the [facility] Attending or Consulting provider [place] or covering provider during after hours [place], for this patient?  1. Check the care team in [facility] and look for a) attending/consulting [facility] provider listed and b) the [facility] team listed 2. Log into [email or web address] and use [place]'s universal password to access. If you do not have the password, please contact the hospital operator. 3. Locate the [facility] provider you are looking for under [facility] and page to a number that you can be directly reached. 4. If you still have difficulty reaching the provider, please page the [facility] (Director on Call) for the Hospitalists listed on amion for assistance.

## 2018-09-14 NOTE — Care Management (Signed)
Call to Debra (dtr), to discuss bed offers. Patient has a $2400 bill at [facility], family can not pay bill at this time.   [facility] has made a bed offer. Dtr. Accepts bed offer.   Debbie of [facility] notified to cancel insurance authorization.   Patrica of [facility] notified to start insurance authorization.   Daughter plans to f/u with [facility] on bill.

## 2018-09-14 NOTE — Evaluation (Signed)
Physical Therapy Evaluation Patient Details Name: Misty Edwards MRN: [id number] DOB: 07-Oct-1926 Today's Date: 09/14/2018   History of Present Illness  Misty Edwards is a 85 y.o. female who had been recently discharged on 09/06/2018 after having been admitted with syncope and collapse, right acetabular fracture, acute kidney injury and hypertension.  She has chronic pancreatitis.  She has had difficulties with eating and drinking and severe protein calorie malnutrition.  She was discharged home however she did not do very well at home.  She ended up being brought back to the emergency department because she was talking out of her head, intermittently confused, she has been seen by the psychiatric service in the ED who recommended rehab placement.  The patient was waiting in the ED for rehab placement and has been in the ED for the past 3 days.  Unfortunately the patient has not been eating and drinking well in the ED.  She initially had normal labs when she presented to the ED however with her poor oral intake, confusion and had been taking her blood pressure medications which included lisinopril she is now in a state of acute kidney injury, dehydration and altered mental status.  She is more confused in the last 24 hours.  She is more lethargic in the last 24 hours.  The ED doctors evaluated her got repeat labs and noticed that her renal function has precipitously declined in the past 3 days.  Her creatinine went from 0.89 to now greater than 2.0.  She has clinical signs of dehydration.  She had a normal urinalysis 3 days ago and repeat urinalysis today with findings of infection.  Pt was having intermittent episodes of hypotension in ED. Pt has insulin dependent diabetes and has been intermittently hyperglycemic.  Pt was started on IV fluids and IV ceftriaxone and admission was requested.    Clinical Impression  Patient agreeable for therapy and follows directions appropriately.  Patient demonstrates  slow labored movement for sitting up at bedside requiring assistance to move RLE, incontinent of urine upon standing with RW, transferred to [employer] to finish and had a bowel movement, very unsteady and limited to a few steps at bedside due to fall risk.  Patient tolerated sitting up in chair to eat breakfast after therapy - RN notified.  Patient will benefit from continued physical therapy in hospital and recommended venue below to increase strength, balance, endurance for safe ADLs and gait.    Follow Up Recommendations SNF    Equipment Recommendations  None recommended by PT    Recommendations for Other Services       Precautions / Restrictions Precautions Precautions: Fall Restrictions Weight Bearing Restrictions: Yes RLE Weight Bearing: Weight bearing as tolerated      Mobility  Bed Mobility Overal bed mobility: Needs Assistance Bed Mobility: Supine to Sit     Supine to sit: Min assist;Mod assist     General bed mobility comments: increased time, labored movement requiring assistance to move RLE  Transfers Overall transfer level: Needs assistance Equipment used: Rolling walker (2 wheeled) Transfers: Sit to/from [facility] Sit to Stand: Min assist Stand pivot transfers: Min assist;Mod assist       General transfer comment: slow unsteady movement  Ambulation/Gait Ambulation/Gait assistance: Mod assist Gait Distance (Feet): 5 Feet Assistive device: Rolling walker (2 wheeled) Gait Pattern/deviations: Decreased step length - right;Decreased step length - left;Decreased stride length;Decreased stance time - right;Antalgic Gait velocity: slow   General Gait Details: limited to 5-6 slow unsteady  labored steps at bedside due to mild increase in right hip pain, incontient of urine upon standing  Stairs            Wheelchair Mobility    Modified Rankin (Stroke Patients Only)       Balance Overall balance assessment: Needs  assistance Sitting-balance support: Feet supported;No upper extremity supported Sitting balance-Leahy Scale: Fair     Standing balance support: During functional activity;Bilateral upper extremity supported Standing balance-Leahy Scale: Fair Standing balance comment: using RW                             Pertinent Vitals/Pain Pain Assessment: Faces Faces Pain Scale: Hurts a little bit Pain Location: right hip with movement and weightbearing Pain Descriptors / Indicators: Sore;Discomfort Pain Intervention(s): Limited activity within patient's tolerance;Monitored during session    Home Living Family/patient expects to be discharged to:: Private residence Living Arrangements: Children Available Help at Discharge: Family;Available 24 hours/day Type of Home: House Home Access: Level entry Entrance Stairs-Rails: Right;Left;Can reach both Entrance Stairs-Number of Steps: 5 Home Layout: One level Home Equipment: Walker - 2 wheels;Cane - single point;Bedside commode;Shower seat;Grab bars - tub/shower Additional Comments: patient legally blind    Prior Function Level of Independence: Needs assistance   Gait / Transfers Assistance Needed: household ambulator with RW PRN, since fall and right acetabular fracture, short household distances with RW with assist  ADL's / Homemaking Assistance Needed: assisted by family        Hand Dominance   Dominant Hand: Left    Extremity/Trunk Assessment   Upper Extremity Assessment Upper Extremity Assessment: Generalized weakness    Lower Extremity Assessment Lower Extremity Assessment: Generalized weakness;RLE deficits/detail;LLE deficits/detail RLE Deficits / Details: grossly 3+/5 LLE Deficits / Details: grossly 4/5    Cervical / Trunk Assessment Cervical / Trunk Assessment: Normal  Communication   Communication: No difficulties  Cognition Arousal/Alertness: Awake/alert Behavior During Therapy: WFL for tasks  assessed/performed Overall Cognitive Status: Within Functional Limits for tasks assessed                                        General Comments      Exercises     Assessment/Plan    PT Assessment Patient needs continued PT services  PT Problem List Decreased strength;Decreased activity tolerance;Decreased balance;Decreased mobility       PT Treatment Interventions Gait training;Stair training;Functional mobility training;Therapeutic exercise;Patient/family education    PT Goals (Current goals can be found in the Care Plan section)  Acute Rehab PT Goals Patient Stated Goal: return home after rehab PT Goal Formulation: With patient Time For Goal Achievement: 09/28/18 Potential to Achieve Goals: Good    Frequency Min 3X/week   Barriers to discharge        Co-evaluation               AM-PAC PT "6 Clicks" Mobility  Outcome Measure Help needed turning from your back to your side while in a flat bed without using bedrails?: A Little Help needed moving from lying on your back to sitting on the side of a flat bed without using bedrails?: A Little Help needed moving to and from a bed to a chair (including a wheelchair)?: A Lot Help needed standing up from a chair using your arms (e.g., wheelchair or bedside chair)?: A Lot Help needed to walk in  hospital room?: A Lot Help needed climbing 3-5 steps with a railing? : Total 6 Click Score: 13    End of Session Equipment Utilized During Treatment: Oxygen Activity Tolerance: Patient tolerated treatment well;Patient limited by fatigue Patient left: in chair;with call bell/phone within reach Nurse Communication: Mobility status PT Visit Diagnosis: Unsteadiness on feet (R26.81);Other abnormalities of gait and mobility (R26.89);Muscle weakness (generalized) (M62.81)    Time: 0093-8182 PT Time Calculation (min) (ACUTE ONLY): 35 min   Charges:   PT Evaluation $PT Eval Moderate Complexity: 1 Mod PT  Treatments $Therapeutic Activity: 23-37 mins        8:59 AM, 09/14/18 Lonell Grandchild, MPT Physical Therapist with [facility] 336 [phone] office [phone] mobile phone

## 2018-09-14 NOTE — Consult Note (Signed)
Consultation Note Date: 09/14/2018   Patient Name: Misty Edwards  DOB: 1926-06-06  MRN: [id number]  Age / Sex: 85 y.o., female  PCP: Susy Frizzle, MD Referring Physician: Murlean Iba, MD  Reason for Consultation: Establishing goals of care  HPI/Patient Profile: 85 y.o. female  with past medical history of recent pelvic fracture, DM, HTN, chronic pancreatitis, shingles to her face admitted on 09/08/2018 with increasing confusion at home after being discharged with her pelvic fracture. She was initially evaluated, found to be hyponatremic which resolved with IV fluids in the ED, and plan was made for her to be dc'd from ED to SNF. While holding in the ED for three days awaiting placement, her status declined with an episode of hypotension on 6/27- improved with IV fluids. Noted she has been hyperglycemic as well. Initial urine culture on 6/25 was nonconclusive with multiple species present and recollection was recommended. Repeat UA on 6/29 suggests infection with large leukocytes, culture is pending. Albumin 3.7. Cr elevated on 6/29 at 2.02- trending down now with hydration. She was admitted to inpatient on 6/29 due to her decline in status in the ED with AKI, dehydration, UTI. Palliative medicine consulted for [place] due to concerns about possible poor po intake- chart review shows 45% dinner eaten last night, 25% breakfast yesterday- no other meals documented.   Clinical Assessment and Goals of Care: Evaluated patient at bedside. She was sitting up in her chair, drinking a cup of water. Able to tell me her name, but unable to name her daughter's name. She tells me she is in the hospital because she "cracked her hip". She said she took a laxative and forgot to wear her depends, and fell on the way to the bathroom.  She denies pain. She is alert, and answers my questions somewhat reliably, however, drifts of  mid sentence with every attempt at conversation. She is unable to engage in [place].   I called and spoke with her daughter- Misty Edwards and grandson- [name]. They are her primary caretakers. Misty Edwards is Media planner for patient.   Prior to patient's fall she was living at home independently, kept a clean house, able to complete all of ADL's, cook for herself, work in her yard. She had not had any recent change in her functional status, nutritional status, or weight loss until she was diagnosed with shingles on 6/16. They noted at that time it was painful for her to eat and drink, however, with treatment she was improving.   Palliative medicine was introduced, advanced care planning and GOC were discussed. Family states their [place] is for patient to go to a SNF rehab and receive PT so that she can return to live in her home for at least a few more years. They state patient always stated she wanted to live to at least 85 years old.   Patient's possible illness trajectories were discussed. We discussed that while we hope that patient's confusion will clear with hydration and clearing infection- there is concern  and frequently SNF can be the beginning of a downward decline for patient's status. We discussed what patient considers quality of life and possible anticipatory care decisions that may arise.   Advanced directives and code status were discussed. Family wishes to continue full code status. Discussed patient's comorbidities and concerns that if patient declined to the point that she required CPR she may not be brought back to a better state than where she is now. We also discussed that this is the beginning point of a conversation, and game them permission to consider their choices may change in the future.  They expressed their dissatisfaction with SNF offerings- they are adamant they do not want their mother going to [facility]. Their preference would be [facility] if possible.    Primary  Decision Maker NEXT OF KIN- patient's daughter- Misty Edwards    SUMMARY OF RECOMMENDATIONS -Full scope care, full code -Will schedule acetaminophen 635m q8hr to provide an effort at pain relief as may be a contributing factor to confusion as well as hyponatremia, UTI -FYI- family stated that at home patient was a very avid water drinker- she constantly drank water all day long and would keep a chart noting her intake- they stated she frequently drank 64oz or more of water per day. -Plan for d/c to SNF for rehab once patient is stable -Will order strict I&O and request all meals documented -Will order Speech eval to ensure there are no mechanical causes for possible poor po intake     Prognosis:    Unable to determine  [place] care service follow[place]  Primary Diagnoses: Present on Admission: . AKI (acute kidney injury) (HD[place] . Essential hypertension . GERD . Chronic pancreatitis ([place] . Dyslipidemia . Type 2 diabetes mellitus with vascular disease (HElectra . Protein-calorie malnutrition, severe . Hyponatremia   I have reviewed the medical record, interviewed the patient and family, and examined the patient. The following aspects are pertinent.  Past Medical History:  Diagnosis Date  . Acute biliary pancreatitis 07/2002   thia was in 07/2004:she still has pseudocyst in tail of pancreas measuring 54 x 33 mm   . Adrenal adenoma    bilateral  . Anxiety   . Chronic pancreatitis ([place]   . Detached retina   . Diabetes mellitus ([place]   . Diverticulosis   . History of carcinoma in situ of breast 1988  . Hyperlipidemia   . Hypertension   . IBS (irritable bowel syndrome)   . Legally blind   . Osteoarthritis   . Osteopenia   . Upper GI bleed September2004   secondary to gastritis   Social History   Socioeconomic History  . Marital status: Widowed    Spouse name: Not on file  . Number of children: 5  . Years of  education: Not on file  . Highest education level: Not on file  Occupational History  . Occupation: homemaker  Social Needs  . Financial resource strain: Not on file  . Food insecurity    Worry: Not on file    Inability: Not on file  . Transportation needs    Medical: Not on file    Non-medical: Not on file  Tobacco Use  . Smoking status: Never Smoker  . Smokeless tobacco: Never Used  Substance and Sexual Activity  . Alcohol use: No    Alcohol/week: 0.0 standard drinks  . Drug use: No  . Sexual activity: Not Currently    Birth  control/protection: Post-menopausal  Lifestyle  . Physical activity    Days per week: Not on file    Minutes per session: Not on file  . Stress: Not on file  Relationships  . Social Herbalist on phone: Not on file    Gets together: Not on file    Attends religious service: Not on file    Active member of club or organization: Not on file    Attends meetings of clubs or organizations: Not on file    Relationship status: Not on file  Other Topics Concern  . Not on file  Social History Narrative   Lives in [place].   Family History  Problem Relation Age of Onset  . Ovarian cancer Mother        passed  . Colon cancer Father 55       passed away in his 69's  . Colon cancer Brother 10       surgery inhis 50's doing well now  . Heart attack Brother        passed away age 94  . Pancreatitis Brother   . Kidney disease Daughter   . Leukemia Son    Scheduled Meds: . acetaminophen  650 mg Oral Q8H  . amLODipine  5 mg Oral Daily  . aspirin  81 mg Oral Daily  . brimonidine  1 drop Both Eyes BID  . enoxaparin (LOVENOX) injection  30 mg Subcutaneous Q24H  . feeding supplement (ENSURE ENLIVE)  237 mL Oral BID BM  . ferrous sulfate  325 mg Oral Q breakfast  . FLUoxetine  20 mg Oral Daily  . insulin aspart  0-9 Units Subcutaneous TID WC  . insulin aspart  4 Units Subcutaneous TID WC  . insulin glargine  18 Units Subcutaneous QHS  .  linagliptin  5 mg Oral Daily  . lipase/protease/amylase  24,000 Units Oral 5 X Daily  . magnesium oxide  400 mg Oral Daily  . multivitamin with minerals  1 tablet Oral Daily  . neomycin-polymyxin b-dexamethasone  1 drop Left Eye TID  . pantoprazole  40 mg Oral Daily  . rosuvastatin  5 mg Oral Daily  . timolol  1 drop Both Eyes BID   Continuous Infusions: . sodium chloride 50 mL/hr at 09/13/18 1618  . cefTRIAXone (ROCEPHIN)  IV     PRN Meds:.acetaminophen **OR** acetaminophen, cycloSPORINE, hydrALAZINE, loratadine, LORazepam, ondansetron **OR** ondansetron (ZOFRAN) IV, simethicone Medications Prior to Admission:  Prior to Admission medications   Medication Sig Start Date End Date Taking? Authorizing Provider  amLODipine (NORVASC) 5 MG tablet TAKE 1 TABLET BY MOUTH ONCE DAILY 04/12/18  Yes Branch, Alphonse Guild, MD  aspirin EC 81 MG EC tablet Take 1 tablet (81 mg total) by mouth daily. 08/31/14  Yes Gold, Wayne E, PA-C  Blood Glucose Monitoring Suppl (ONE TOUCH ULTRA 2) w/Device KIT Use as directed to monitor FSBS 3x daily. Dx: E11.9. 07/13/18  Yes Susy Frizzle, MD  brimonidine-timolol (COMBIGAN) 0.2-0.5 % ophthalmic solution Place 1 drop into both eyes every 12 (twelve) hours.   Yes [provider]  CREON 24000-76000 units CPEP TAKE 2 CAPSULES BY MOUTH WITH MEALS AND 1 CAP WITH SNACKS x 2 Patient taking differently: Take 1-2 capsules by mouth 5 (five) times daily. Patient takes 2 capsules with meals and 1 capsule with snacks 05/19/18  Yes Susy Frizzle, MD  cycloSPORINE (RESTASIS) 0.05 % ophthalmic emulsion Place 1 drop into both eyes 3 (three) times daily as needed.  Yes [provider]  feeding supplement, ENSURE ENLIVE, (ENSURE ENLIVE) LIQD Take 237 mLs by mouth 2 (two) times daily between meals. 09/07/18  Yes Lavina Hamman, MD  ferrous sulfate 325 (65 FE) MG tablet Take 325 mg by mouth daily with breakfast.   Yes [provider]  FLUoxetine (PROZAC) 20 MG  tablet TAKE 1 TABLET BY MOUTH ONCE DAILY 02/15/18  Yes Susy Frizzle, MD  glucose blood (ONE TOUCH ULTRA TEST) test strip Use as directed to monitor FSBS 5 x daily. Dx: E11.9. 08/18/18  Yes Susy Frizzle, MD  insulin NPH-regular Human (NOVOLIN 70/30) (70-30) 100 UNIT/ML injection 25 units q am and 15 units q pm Patient taking differently: Inject 15-25 Units into the skin 2 (two) times daily with a meal. Patient takes 25 units in the morning and 15 units in the evening 06/03/18  Yes Susy Frizzle, MD  Insulin Syringes, Disposable, U-100 1 ML MISC 1/2 inch 31 g 05/11/17  Yes Susy Frizzle, MD  lisinopril (ZESTRIL) 2.5 MG tablet Take 1 tablet by mouth once daily Patient taking differently: Take 2.5 mg by mouth daily as needed.  08/03/18  Yes Branch, Alphonse Guild, MD  LORazepam (ATIVAN) 0.5 MG tablet TAKE 1 OR 2 TABLETS BY MOUTH AT BEDTIME AS NEEDED FOR  INSOMNIA Patient taking differently: Take 0.5-1 mg by mouth at bedtime as needed.  08/30/18  Yes Susy Frizzle, MD  Magnesium Oxide 400 (240 Mg) MG TABS TAKE 1 TABLET BY MOUTH ONCE DAILY 03/24/18  Yes Branch, Alphonse Guild, MD  metFORMIN (GLUCOPHAGE) 1000 MG tablet Take 500 mg by mouth 2 (two) times daily with a meal.   Yes [provider]  metoprolol tartrate (LOPRESSOR) 25 MG tablet Take 1 tablet by mouth twice daily 07/07/18  Yes Susy Frizzle, MD  Multiple Vitamin (MULTIVITAMIN) capsule Take 1 capsule by mouth daily.   Yes [provider]  neomycin-polymyxin b-dexamethasone (MAXITROL) 3.5-10000-0.1 OINT APPLY OINTMENT INTO LEFT EYE THREE TIMES DAILY AS DIRECTED 09/02/18  Yes [provider]  OneTouch Delica Lancets 81E MISC Use as directed to monitor FSBS 3x daily. Dx: E11.9. 07/13/18  Yes Susy Frizzle, MD  pantoprazole (PROTONIX) 40 MG tablet Take 1 tablet by mouth once daily 08/03/18  Yes Branch, Alphonse Guild, MD  RELION INSULIN SYR 0.5ML/31G 31G X 5/16" 0.5 ML MISC USE AS DIRECTED 04/05/18  Yes Shoshone, Modena Nunnery,  MD  RELION INSULIN SYRINGE 1ML/31G 31G X 5/16" 1 ML MISC  05/11/17  Yes [provider]  rosuvastatin (CRESTOR) 5 MG tablet TAKE 1 TABLET BY MOUTH ONCE DAILY 02/22/18  Yes Branch, Alphonse Guild, MD  Simethicone (GAS-X EXTRA STRENGTH) 125 MG CAPS Take 125 mg by mouth 2 (two) times daily as needed (GAS).   Yes [provider]  triamcinolone cream (KENALOG) 0.1 % APPLY  CREAM EXTERNALLY TWICE DAILY Patient taking differently: as needed.  03/02/17  Yes Susy Frizzle, MD  Wheat Dextrin (BENEFIBER DRINK MIX) PACK Take 1 Package by mouth daily.   Yes [provider]  EQ ALLERGY RELIEF, CETIRIZINE, 10 MG tablet Take 1 tablet by mouth once daily 09/13/18   Susy Frizzle, MD   Allergies  Allergen Reactions  . Calcium-Containing Compounds Nausea Only  . Morphine And Related Other (See Comments)    Hallucination  . Raloxifene Itching    Evista- Face and eyes burning  . Vitamin D Analogs Nausea Only   Review of Systems  Unable to perform ROS:  Mental status change    Physical Exam Vitals signs and nursing note reviewed.  Constitutional:      Appearance: Normal appearance.  Cardiovascular:     Rate and Rhythm: Normal rate.  Pulmonary:     Effort: Pulmonary effort is normal.  Skin:    Comments: Crusted lesions to left midline of face  Neurological:     Mental Status: She is alert.     Comments: Pleasantly confused     Vital Signs: BP (!) 188/73 (BP Location: Left Arm)   Pulse 83   Temp 97.6 F (36.4 C) (Oral)   Resp 17   Ht '5\' 3"'  (1.6 m)   Wt 64.9 kg   SpO2 97%   BMI 25.33 kg/m  Pain Scale: 0-10   Pain Score: 0-No pain   SpO2: SpO2: 97 % O2 Device:SpO2: 97 % O2 Flow Rate: .O2 Flow Rate (L/min): 2 L/min  IO: Intake/output summary:   Intake/Output Summary (Last 24 hours) at 09/14/2018 1149 Last data filed at 09/14/2018 0500 Gross per 24 hour  Intake 1074.72 ml  Output 700 ml  Net 374.72 ml    LBM: Last BM Date: 09/12/18 Baseline Weight:  Weight: 64.9 kg Most recent weight: Weight: 64.9 kg     Palliative Assessment/Data: PPS: 60%     Thank you for this consult. Palliative medicine will continue to follow and assist as needed.   Time In: 0930 Time Out: 1100 Time Total: 90 minutes Greater than 50%  of this time was spent counseling and coordinating care related to the above assessment and plan.  Signed by: Mariana Kaufman, AGNP-C Palliative Medicine    Please contact Palliative Medicine Team phone at [phone] for questions and concerns.  For individual provider: See Shea Evans

## 2018-09-14 NOTE — TOC Progression Note (Signed)
Transition of Care [facility]) - Progression Note    Patient Details  Name: Misty Edwards MRN: [id number] Date of Birth: July 11, 1926  [facility] CM/SW Contact  Hanish Laraia, Chauncey Reading, RN Phone Number: 09/14/2018, 12:18 PM  Clinical Narrative:   Patient admitted for UTI and AKI. Recent admission for hip fracture, family elected for patient to come home with home health PT originially. Patient became confused and was brought back to ED, pysch consulted and patient cleared. Search for SNF placement began.  Palliative consulted for [place]. Palliative discussed with Misty Edwards, daughter. Plan is for patient to go to short term rehab and return home.  Discussed with Misty Edwards options/bed offers. They would like to follow up with [facility] and [facility]. Only bed offer is from [place] at this time. Misty Edwards does not want [facility] as was previously elected by Misty Edwards (grand dtr).  Calls placed to [facility] and [facility] about pending bed offers. Debra aware that we will need to open up bed search if she is not agreeable to [place].  Misty Edwards reports that patient is independent pta and they want patient to return home after short term rehab.     Expected Discharge Plan: Skilled Nursing Facility Barriers to Discharge: Ship broker  Expected Discharge Plan and Services Expected Discharge Plan: [place] In-house Referral: PCP / [facility] Discharge Planning Services: CM Consult Post Acute Care Choice: [place] Living arrangements for the past 2 months: Mobile Home                                       Social Determinants of Health (SDOH) Interventions    Readmission Risk Interventions No flowsheet data found.

## 2018-09-14 NOTE — Progress Notes (Addendum)
Initial Nutrition Assessment  DOCUMENTATION CODES:  Not applicable  INTERVENTION:  Due to hyperglycemia- d/c Ensure and order Glucerna Shake po TID, each supplement provides 220 kcal and 10 grams of protein.   Downgrade meats to D2 per pt/family request  Concern pt is constipated. May be impacting oral intake. Discussed with MD/RN  NUTRITION DIAGNOSIS:  Inadequate oral intake related to lethargy/confusion, poor appetite as evidenced by meal completion < 50%.   GOAL:  Patient will meet greater than or equal to 90% of their needs  MONITOR:  Labs, I & O's, Supplement acceptance, PO intake, Weight trends  REASON FOR ASSESSMENT:  Malnutrition Screening Tool    ASSESSMENT:  85 y/o female w/ PMHx HTN/HLD, chronic pancreatitis, Breast cancer, IBS, Diverticulosis, anxiety, dm2. Hospitalized 6/19-6/22 for fall with R acetabular fx. Family declined SNF and took home. Brought back to ED in 48 hrs d/t AMS and family's inability to care for pt. In ED 6/24-6/29. Required admission 6/29 d/t worsening lethargy and AKI due to dehydration/poor oral intake.   Pt known to RD as he had just completed full assessment on patient 6/19 after her hip fx. At that time, pt met criteria for severe malnutrition in acute context, based on several weeks of minimal intake and weight loss 2/2 shingles. Pt believed this directly led to her fracture, as she had fallen d/t dehydration.   Pt appears to have declined further in past ~10 days. She has reportedly had very poor intake in the ED. Unfortunately,level of intake is not quantifiable as there are no meal records during the time pt was in the ED. Since brought up to the floor, pt has been eating 25-45% of meals.   Today, pt shows a drastically change in her mental state. When RD had spoken to her 10 days ago, she was able to give RD specific information regarding the amount of meals she ate, the vit/meds she took, weight history and even the number of fluid ounces  she drank each day. She now is not sure where she is and gets confused with basic questions. She cannot give any history regarding her oral intake in ED other than "I eat light during the day". She is seen at lunch time and ~50% of her meal is eaten. RD asked why she didn't finish chicken/brocoli and she said it "wasnt chopped fine enough". RD asked if she would want diet downgrade. SHe said yes, but only for her meats.    Cannot tell from chart when pts last BM was. Pt herself is unsure, she says she knows she had one at home, but thinks she may have had one in hospital. When asked if she is/feels constipated, she says "may be". She feels distended. She is on iron. Do not seen bowel regimen ordered.    Weight wise, despite a reported poor intake in ED, pt actually is 5 lbs heavier than when evaluated on 6/19. Could not confirm this w/ bed wt today as pt was up in chair. Pt had reported a UBW of 142-144 lbs and chart indicates that, prior to her shingles, pts weight had fluctuated between 135-145 for many years.   Pt says she likes the Ensure. At this time pt is having significant hyperglycemia- will change to glucerna. Will downgrade diet as discussed with patient.  Discussed findings with RN/MD.   Labs: BGs: 210-260, most recent actually was 336, BUN/Creat:29/1.22, Na: 131 Meds: Ensure ENlive BID, Iron, SSI, Creon, mag ox, mvi with min, ppi, iv abx  Recent Labs  Lab 09/09/18 0652 09/13/18 1151 09/14/18 0454  NA 131* 130* 131*  K 4.1 4.4 4.2  CL 93* 90* 94*  CO2 '26 28 27  ' BUN 20 33* 29*  CREATININE 0.86 2.02* 1.22*  CALCIUM 8.6* 9.3 8.8*  MG  --   --  2.0  GLUCOSE 207* 157* 217*   NUTRITION - FOCUSED PHYSICAL EXAM: Deferred   Diet Order:   Diet Order            DIET DYS 3 Room service appropriate? Yes; Fluid consistency: Thin  Diet effective now             EDUCATION NEEDS:  No education needs have been identified at this time  Skin:  Skin Assessment: Reviewed RN  Assessment  Last BM: Unknown  Height:  Ht Readings from Last 1 Encounters:  09/13/18 '5\' 3"'  (1.6 m)   Weight:  Wt Readings from Last 1 Encounters:  09/13/18 64.9 kg   Wt Readings from Last 10 Encounters:  09/13/18 64.9 kg  09/06/18 63 kg  08/31/18 64.4 kg  04/29/18 65.8 kg  03/12/18 65.3 kg  11/13/17 66.6 kg  05/15/17 66.7 kg  05/11/17 67.6 kg  05/04/17 66.2 kg  11/14/16 64 kg   Ideal Body Weight:  52.27 kg  BMI:  Body mass index is 25.33 kg/m.  Estimated Nutritional Needs:  Kcal:  1600-1750- (25-27 kcal/kg bw) Protein:  78-91g Pro (1.2-1.4 g/kg bw) Fluid:  >1.6 L (25 ml/kg bw)  Burtis Junes RD, LDN, CNSC Clinical Nutrition Available Tues-Sat via Pager: [phone] 09/14/2018 1:23 PM

## 2018-09-14 NOTE — Evaluation (Addendum)
Clinical/Bedside Swallow Evaluation Patient Details  Name: Misty Edwards MRN: [id number] Date of Birth: Apr 15, 1926  Today's Date: 09/14/2018 Time: SLP Start Time (ACUTE ONLY): 1308 SLP Stop Time (ACUTE ONLY): 1328 SLP Time Calculation (min) (ACUTE ONLY): 20 min  Past Medical History:  Past Medical History:  Diagnosis Date  . Acute biliary pancreatitis 07/2002   thia was in 07/2004:she still has pseudocyst in tail of pancreas measuring 54 x 33 mm   . Adrenal adenoma    bilateral  . Anxiety   . Chronic pancreatitis ([place])   . Detached retina   . Diabetes mellitus ([place])   . Diverticulosis   . History of carcinoma in situ of breast 1988  . Hyperlipidemia   . Hypertension   . IBS (irritable bowel syndrome)   . Legally blind   . Osteoarthritis   . Osteopenia   . Upper GI bleed September2004   secondary to gastritis   Past Surgical History:  Past Surgical History:  Procedure Laterality Date  . CARDIAC CATHETERIZATION N/A 08/24/2014   Procedure: Left Heart Cath and Coronary Angiography;  Surgeon: Leonie Man, MD;  Location: [place] [facility];  Service: Cardiovascular;  Laterality: N/A;  . COLONOSCOPY  08/15/05   few tiny diverticula at sigmoid colon/external hemorrhoids but no polyps  . COLONOSCOPY  01/08/2012   ELF:YBOFBPZ diverticulosis. Next colonoscopy in 12/2016  . CORONARY ARTERY BYPASS GRAFT N/A 08/24/2014   Procedure: CORONARY ARTERY BYPASS GRAFTING (CABG) x three, using left internal mammary artery and right leg greater saphenous vein harvested endoscopically;  Surgeon: Ivin Poot, MD;  Location: [place];  Service: Open Heart Surgery;  Laterality: N/A;  . ECTOPIC PREGNANCY SURGERY  1950's  . ERCP with sphincterotomy  07/2002  . ESOPHAGOGASTRODUODENOSCOPY      Gastritis of body.  Otherwise normal  . ESOPHAGOGASTRODUODENOSCOPY  01/08/2012   RMR: Few scattered gastric erosions of uncertain significance-status post biopsy. Minimal chronic inflammation, no H.pylori  .  LAPAROSCOPIC CHOLECYSTECTOMY    . PANCREATIC PSEUDOCYST DRAINAGE  [id number]   drained percutaneously   . right mastectomy    . tacking up of her bladder    . TEE WITHOUT CARDIOVERSION N/A 08/24/2014   Procedure: TRANSESOPHAGEAL ECHOCARDIOGRAM (TEE);  Surgeon: Ivin Poot, MD;  Location: [place];  Service: Open Heart Surgery;  Laterality: N/A;   HPI:  Misty Edwards is a 85 y.o. female who had been recently discharged on 09/06/2018 after having been admitted with syncope and collapse, right acetabular fracture, acute kidney injury and hypertension.  She has chronic pancreatitis.  She has had difficulties with eating and drinking and severe protein calorie malnutrition.  She was discharged home however she did not do very well at home.  She ended up being brought back to the emergency department because she was talking out of her head, intermittently confused, she has been seen by the psychiatric service in the ED who recommended rehab placement.  The patient was waiting in the ED for rehab placement and has been in the ED for the past 3 days.  Unfortunately the patient has not been eating and drinking well in the ED.  She initially had normal labs when she presented to the ED however with her poor oral intake, confusion and had been taking her blood pressure medications which included lisinopril she is now in a state of acute kidney injury, dehydration and altered mental status.  She is more confused in the last 24 hours.  She is more lethargic in  the last 24 hours.  The ED doctors evaluated her got repeat labs and noticed that her renal function has precipitously declined in the past 3 days.  Her creatinine went from 0.89 to now greater than 2.0.  She has clinical signs of dehydration.  She had a normal urinalysis 3 days ago and repeat urinalysis today with findings of infection.  Pt was having intermittent episodes of hypotension in ED. Pt has insulin dependent diabetes and has been intermittently  hyperglycemic.  Pt was started on IV fluids and IV ceftriaxone and admission was requested.    Assessment / Plan / Recommendation Clinical Impression  Clinical swallow evaluation completed in room with Pt seated upright in chair. Oral motor examination is WNL. Pt with missing/sparse dentition. Pt reportedly had difficulty masticating meats on her lunch tray (diet order was for "soft" not mechanical soft). Pt with strong vocal quality, prompt swallow, and strong cough upon verbal request. Pt without overt signs or symptoms of reduced airway protection, she does have audible swallow with sequential straw sips thin. Pt with prolonged oral transit with solids and improved with soft solids. Will downgrade to D2 and thin liquids with standard aspiration and reflux precautions. Above to RN. SLP will sign off; Reconsult as indicated.  SLP Visit Diagnosis: Dysphagia, unspecified (R13.10)    Aspiration Risk  Mild aspiration risk    Diet Recommendation Dysphagia 2;Thin liquid   Liquid Administration via: Cup;Straw Medication Administration: Whole meds with liquid Supervision: Patient able to self feed;Intermittent supervision to cue for compensatory strategies Postural Changes: Seated upright at 90 degrees;Remain upright for at least 30 minutes after po intake    Other  Recommendations Oral Care Recommendations: Oral care BID;Staff/trained caregiver to provide oral care Other Recommendations: Clarify dietary restrictions   Follow up Recommendations None      Frequency and Duration            Prognosis Prognosis for Safe Diet Advancement: Good Barriers to Reach Goals: Cognitive deficits      Swallow Study   General Date of Onset: 09/08/18 HPI: Misty Edwards is a 85 y.o. female who had been recently discharged on 09/06/2018 after having been admitted with syncope and collapse, right acetabular fracture, acute kidney injury and hypertension.  She has chronic pancreatitis.  She has had  difficulties with eating and drinking and severe protein calorie malnutrition.  She was discharged home however she did not do very well at home.  She ended up being brought back to the emergency department because she was talking out of her head, intermittently confused, she has been seen by the psychiatric service in the ED who recommended rehab placement.  The patient was waiting in the ED for rehab placement and has been in the ED for the past 3 days.  Unfortunately the patient has not been eating and drinking well in the ED.  She initially had normal labs when she presented to the ED however with her poor oral intake, confusion and had been taking her blood pressure medications which included lisinopril she is now in a state of acute kidney injury, dehydration and altered mental status.  She is more confused in the last 24 hours.  She is more lethargic in the last 24 hours.  The ED doctors evaluated her got repeat labs and noticed that her renal function has precipitously declined in the past 3 days.  Her creatinine went from 0.89 to now greater than 2.0.  She has clinical signs of dehydration.  She had  a normal urinalysis 3 days ago and repeat urinalysis today with findings of infection.  Pt was having intermittent episodes of hypotension in ED. Pt has insulin dependent diabetes and has been intermittently hyperglycemic.  Pt was started on IV fluids and IV ceftriaxone and admission was requested.  Type of Study: Bedside Swallow Evaluation Previous Swallow Assessment: None on record Diet Prior to this Study: Dysphagia 3 (soft);Thin liquids Temperature Spikes Noted: No Respiratory Status: Nasal cannula History of Recent Intubation: No Behavior/Cognition: Alert;Cooperative;Pleasant mood Oral Cavity Assessment: Within Functional Limits Oral Care Completed by SLP: No Oral Cavity - Dentition: Missing dentition Vision: Functional for self-feeding Self-Feeding Abilities: Able to feed self;Needs set  up Patient Positioning: Upright in chair Baseline Vocal Quality: Normal Volitional Cough: Strong Volitional Swallow: Able to elicit    Oral/Motor/Sensory Function Overall Oral Motor/Sensory Function: Within functional limits   Ice Chips Ice chips: Within functional limits Presentation: Spoon   Thin Liquid Thin Liquid: Within functional limits Presentation: Cup;Self Fed;Straw    Nectar Thick Nectar Thick Liquid: Not tested   Honey Thick Honey Thick Liquid: Not tested   Puree Puree: Within functional limits Presentation: Self Fed;Spoon   Solid     Solid: Impaired Presentation: Self Fed;Spoon Oral Phase Impairments: Impaired mastication Oral Phase Functional Implications: Impaired mastication;Prolonged oral transit     Thank you,  Genene Churn, [place]  , 09/14/2018,1:28 PM

## 2018-09-15 DIAGNOSIS — N179 Acute kidney failure, unspecified: Principal | ICD-10-CM

## 2018-09-15 LAB — BASIC METABOLIC PANEL
Anion gap: 9 (ref 5–15)
BUN: 24 mg/dL — ABNORMAL HIGH (ref 8–23)
CO2: 28 mmol/L (ref 22–32)
Calcium: 8.9 mg/dL (ref 8.9–10.3)
Chloride: 96 mmol/L — ABNORMAL LOW (ref 98–111)
Creatinine, Ser: 0.9 mg/dL (ref 0.44–1.00)
GFR calc Af Amer: >60 mL/min (ref >60)
GFR calc non Af Amer: 57 mL/min — ABNORMAL LOW (ref >60)
Glucose, Bld: 119 mg/dL — ABNORMAL HIGH (ref 70–99)
Potassium: 4.2 mmol/L (ref 3.5–5.1)
Sodium: 133 mmol/L — ABNORMAL LOW (ref 135–145)

## 2018-09-15 LAB — CBC
HCT: 32.8 % — ABNORMAL LOW (ref 36.0–46.0)
Hemoglobin: 10.7 g/dL — ABNORMAL LOW (ref 12.0–15.0)
MCH: 29.2 pg (ref 26.0–34.0)
MCHC: 32.6 g/dL (ref 30.0–36.0)
MCV: 89.4 fL (ref 80.0–100.0)
Platelets: 176 10*3/uL (ref 150–400)
RBC: 3.67 MIL/uL — ABNORMAL LOW (ref 3.87–5.11)
RDW: 13.9 % (ref 11.5–15.5)
WBC: 7.9 10*3/uL (ref 4.0–10.5)
nRBC: 0 % (ref 0.0–0.2)

## 2018-09-15 LAB — GLUCOSE, CAPILLARY
Glucose-Capillary: 132 mg/dL — ABNORMAL HIGH (ref 70–99)
Glucose-Capillary: 222 mg/dL — ABNORMAL HIGH (ref 70–99)
Glucose-Capillary: 229 mg/dL — ABNORMAL HIGH (ref 70–99)
Glucose-Capillary: 189 mg/dL — ABNORMAL HIGH (ref 70–99)

## 2018-09-15 LAB — MAGNESIUM: Magnesium: 2 mg/dL (ref 1.7–2.4)

## 2018-09-15 MED ORDER — INSULIN ASPART 100 UNIT/ML ~~LOC~~ SOLN: 8.0000 [IU] | Freq: Three times a day (TID) | SUBCUTANEOUS | 11 refills | Status: DC

## 2018-09-15 MED ORDER — ENOXAPARIN SODIUM 40 MG/0.4ML ~~LOC~~ SOLN
40.0000 mg | SUBCUTANEOUS | Status: DC
  Administered 2018-09-15 – 2018-09-22 (×8): 40 mg via SUBCUTANEOUS
  Filled 2018-09-15 (×8): qty 0.4

## 2018-09-15 MED ORDER — INSULIN GLARGINE 100 UNIT/ML ~~LOC~~ SOLN: 24.0000 [IU] | Freq: Every day | SUBCUTANEOUS | 11 refills | Status: DC

## 2018-09-15 MED ORDER — ACETAMINOPHEN 325 MG PO TABS: 650.0000 mg | ORAL_TABLET | Freq: Four times a day (QID) | ORAL | 0 refills | Status: AC | PRN

## 2018-09-15 MED ORDER — BISACODYL 10 MG RE SUPP: 10.0000 mg | Freq: Every day | RECTAL | 0 refills | Status: DC | PRN

## 2018-09-15 MED ORDER — POLYETHYLENE GLYCOL 3350 17 G PO PACK: 17.0000 g | PACK | Freq: Every day | ORAL | 0 refills | Status: DC

## 2018-09-15 MED ORDER — CEFDINIR 300 MG PO CAPS: 300.0000 mg | ORAL_CAPSULE | Freq: Two times a day (BID) | ORAL | 0 refills | Status: DC

## 2018-09-15 NOTE — TOC Progression Note (Signed)
Transition of Care [facility]) - Progression Note    Patient Details  Name: Misty Edwards MRN: [id number] Date of Birth: 07/22/1926  [facility]) CM/SW Contact  Ihor Gully, LCSW Phone Number: 09/15/2018, 3:55 PM  Clinical Narrative:    Waiting on authorization. Facility request new Covid test as most recent was on 09/09/2018.  Attending notified and will order.    Expected Discharge Plan: Skilled Nursing Facility Barriers to Discharge: Insurance Authorization  Expected Discharge Plan and Services Expected Discharge Plan: [place] In-house Referral: PCP / [facility] Discharge Planning Services: CM Consult Post Acute Care Choice: [place] Living arrangements for the past 2 months: Mobile Home Expected Discharge Date: 09/15/18                                     Social Determinants of Health (SDOH) Interventions    Readmission Risk Interventions No flowsheet data found.

## 2018-09-15 NOTE — Discharge Summary (Addendum)
Physician Discharge Summary  Misty Edwards IPJ:[id number] DOB: 1926/08/02 DOA: 09/08/2018  PCP: Susy Frizzle, MD  Admit date: 09/08/2018  Discharge date: 09/23/2018  Admitted From:Home  Disposition:  SNF  Recommendations for Outpatient Follow-up:  1. Follow up with PCP in 1-2 weeks 2. Please obtain BMP in one week to ensure renal function is stable 3. Please use Tylenol only for pain management  Home Health: None  Equipment/Devices: None  Discharge Condition: Stable  CODE STATUS: Full  Diet recommendation: Heart Healthy/carb modified-dysphagia 2 with 2L fluid restriction  Brief/Interim Summary: Per HPI: 85 y.o.femalewho had been recently discharged on 09/06/2018 after having been admitted with syncope and collapse, right acetabular fracture, acute kidney injury and hypertension. She has chronic pancreatitis. She has had difficulties with eating and drinking and severe protein calorie malnutrition. She was discharged home however she did not do very well at home. She ended up being brought back to the emergency department because she was talking out of her head, intermittently confused, she has been seen by the psychiatric service in the ED who recommended rehab placement. The patient was waiting in the ED for rehab placement and has been in the ED for the past 3 days. Unfortunately the patient has not been eating and drinking well in the ED. She initially had normal labs when she presented to the ED however with her poor oral intake, confusion and had been taking her blood pressure medications which included lisinopril she is now in a state of acute kidney injury, dehydration and altered mental status.   AKI has improved with IV fluid hydration and she was noted to be hyponatremic as a result of excessive water drinking.  She is also noted to have UTI with gram-negative rods that has improved with IV Rocephin over the last 2 days and she will receive 1 more dose of Omnicef to  complete her course of treatment.  Her diabetes control has been quite brittle and will be started on Lantus and prandial NovoLog with discontinuation of 70-30 insulin.  She has been seen by dietitian as well as SLP recommending dysphagia 2 diet.  Hyponatremia as well as acute kidney injury has now improved.  Sodium levels are 133 and creatinine is 0.9 which is stable at her baseline.  Notably, she is also had a recent acetabular fracture and has been seen by orthopedics and has been recommended to be weightbearing as tolerated with Tylenol for pain management as recommended by palliative care.  She is also noted to have chronic pancreatitis and continues Creon with her meals.  Discharge Diagnoses:  Principal Problem:   AKI (acute kidney injury) ([place]) Active Problems:   Essential hypertension   GERD   Chronic pancreatitis ([place])   UTI (urinary tract infection)   S/P CABG x 3   Dyslipidemia   Type 2 diabetes mellitus with vascular disease ([facility])   Protein-calorie malnutrition, severe   Hyponatremia   Palliative care by specialist   Advanced care planning/counseling discussion   Goals of care, counseling/discussion   Disorientation    Discharge Instructions  Discharge Instructions    Diet - low sodium heart healthy   Complete by: As directed    Increase activity slowly   Complete by: As directed      Allergies as of 09/23/2018      Reactions   Calcium-containing Compounds Nausea Only   Morphine And Related Other (See Comments)   Hallucination   Raloxifene Itching   Evista- Face and eyes burning  Vitamin D Analogs Nausea Only      Medication List    STOP taking these medications   insulin NPH-regular Human (70-30) 100 UNIT/ML injection Commonly known as: NovoLIN 70/30   valACYclovir 1000 MG tablet Commonly known as: VALTREX     TAKE these medications   acetaminophen 325 MG tablet Commonly known as: TYLENOL Take 2 tablets (650 mg total) by mouth every 6 (six) hours as  needed for mild pain (or Fever >/= 101).   amLODipine 5 MG tablet Commonly known as: NORVASC TAKE 1 TABLET BY MOUTH ONCE DAILY   aspirin 81 MG EC tablet Take 1 tablet (81 mg total) by mouth daily.   Benefiber Drink Mix Pack Take 1 Package by mouth daily.   bisacodyl 10 MG suppository Commonly known as: DULCOLAX Place 1 suppository (10 mg total) rectally daily as needed for moderate constipation.   Combigan 0.2-0.5 % ophthalmic solution Generic drug: brimonidine-timolol Place 1 drop into both eyes every 12 (twelve) hours.   Creon 24000-76000 units Cpep Generic drug: Pancrelipase (Lip-Prot-Amyl) TAKE 2 CAPSULES BY MOUTH WITH MEALS AND 1 CAP WITH SNACKS x 2 What changed:   how much to take  how to take this  when to take this  additional instructions   cycloSPORINE 0.05 % ophthalmic emulsion Commonly known as: RESTASIS Place 1 drop into both eyes 3 (three) times daily as needed.   EQ Allergy Relief (Cetirizine) 10 MG tablet Generic drug: cetirizine Take 1 tablet by mouth once daily   feeding supplement (ENSURE ENLIVE) Liqd Take 237 mLs by mouth 2 (two) times daily between meals.   ferrous sulfate 325 (65 FE) MG tablet Take 325 mg by mouth daily with breakfast.   FLUoxetine 20 MG tablet Commonly known as: PROZAC TAKE 1 TABLET BY MOUTH ONCE DAILY   Gas-X Extra Strength 125 MG Caps Generic drug: Simethicone Take 125 mg by mouth 2 (two) times daily as needed (GAS).   glucose blood test strip Commonly known as: ONE TOUCH ULTRA TEST Use as directed to monitor FSBS 5 x daily. Dx: E11.9.   insulin aspart 100 UNIT/ML injection Commonly known as: novoLOG Inject 8 Units into the skin 3 (three) times daily with meals.   insulin glargine 100 UNIT/ML injection Commonly known as: LANTUS Inject 0.24 mLs (24 Units total) into the skin at bedtime.   Insulin Syringes (Disposable) U-100 1 ML Misc 1/2 inch 31 g   lisinopril 2.5 MG tablet Commonly known as: ZESTRIL Take  1 tablet by mouth once daily What changed:   when to take this  reasons to take this   loperamide 2 MG capsule Commonly known as: IMODIUM Take 1 capsule (2 mg total) by mouth as needed for diarrhea or loose stools.   LORazepam 0.5 MG tablet Commonly known as: ATIVAN TAKE 1 OR 2 TABLETS BY MOUTH AT BEDTIME AS NEEDED FOR  INSOMNIA What changed: See the new instructions.   Magnesium Oxide 400 (240 Mg) MG Tabs TAKE 1 TABLET BY MOUTH ONCE DAILY   metFORMIN 1000 MG tablet Commonly known as: GLUCOPHAGE Take 500 mg by mouth 2 (two) times daily with a meal.   metoprolol tartrate 25 MG tablet Commonly known as: LOPRESSOR Take 1 tablet by mouth twice daily   multivitamin capsule Take 1 capsule by mouth daily.   neomycin-polymyxin b-dexamethasone 3.5-10000-0.1 Oint Commonly known as: MAXITROL APPLY OINTMENT INTO LEFT EYE THREE TIMES DAILY AS DIRECTED   ONE TOUCH ULTRA 2 w/Device Kit Use as directed to monitor FSBS  3x daily. Dx: E11.9.   OneTouch Delica Lancets 78H Misc Use as directed to monitor FSBS 3x daily. Dx: E11.9.   pantoprazole 40 MG tablet Commonly known as: PROTONIX Take 1 tablet by mouth once daily   polyethylene glycol 17 g packet Commonly known as: MIRALAX / GLYCOLAX Take 17 g by mouth daily.   RELION INSULIN SYRINGE 1ML/31G 31G X 5/16" 1 ML Misc Generic drug: Insulin Syringe-Needle U-100   RELION INSULIN SYR 0.5ML/31G 31G X 5/16" 0.5 ML Misc Generic drug: Insulin Syringe-Needle U-100 USE AS DIRECTED   rosuvastatin 5 MG tablet Commonly known as: CRESTOR TAKE 1 TABLET BY MOUTH ONCE DAILY   triamcinolone cream 0.1 % Commonly known as: KENALOG APPLY  CREAM EXTERNALLY TWICE DAILY What changed: See the new instructions.      Contact information for after-discharge care    Destination    [facility] .   Service: Skilled Nursing Contact information: [address]  [place] [phone]             Allergies  Allergen Reactions  . Calcium-Containing Compounds Nausea Only  . Morphine And Related Other (See Comments)    Hallucination  . Raloxifene Itching    Evista- Face and eyes burning  . Vitamin D Analogs Nausea Only    Consultations:  Palliative care   Procedures/Studies: Dg Chest 2 View  Result Date: 09/09/2018 CLINICAL DATA:  Altered mental status EXAM: CHEST - 2 VIEW COMPARISON:  10/23/2016 FINDINGS: The lungs are hyperinflated with diffuse interstitial prominence. No focal airspace consolidation or pulmonary edema. No pleural effusion or pneumothorax. Normal cardiomediastinal contours. There are right axillary surgical clips. Remote median sternotomy and CABG. IMPRESSION: COPD without acute airspace disease. Electronically Signed   By: Ulyses Jarred M.D.   On: 09/09/2018 02:26   Dg Pelvis 1-2 Views  Result Date: 09/03/2018 CLINICAL DATA:  Fall.  Pain EXAM: PELVIS - 1-2 VIEW COMPARISON:  CT 09/03/2018. FINDINGS: Complex right acetabular fracture noted. Slight angulation deformity present. Diffuse osteopenia. Degenerative changes lumbar spine and both hips. Aortoiliac and peripheral atherosclerotic vascular calcification. Pelvic calcifications consistent phleboliths. IMPRESSION: 1. Complex right acetabular fracture with slight angulation deformity. 2.  Diffuse osteopenia and degenerative change. 3.  Aortoiliac and peripheral vascular disease. Electronically Signed   By: Marcello Moores  Register   On: 09/03/2018 06:08   Ct Head Wo Contrast  Result Date: 09/09/2018 CLINICAL DATA:  Confusion EXAM: CT HEAD WITHOUT CONTRAST TECHNIQUE: Contiguous axial images were obtained from the base of the skull through the vertex without intravenous contrast. COMPARISON:  CT head dated September 03, 2018 FINDINGS: Brain: No evidence of acute infarction, hemorrhage, hydrocephalus, extra-axial collection or mass lesion/mass effect. Volume loss and chronic microvascular  ischemic changes are again noted. Vascular: No hyperdense vessel or unexpected calcification. Skull: Normal. Negative for fracture or focal lesion. Sinuses/Orbits: No acute finding. The patient is status post prior cataract surgery on the right. Other: None. IMPRESSION: No acute intracranial abnormality. Electronically Signed   By: Constance Holster M.D.   On: 09/09/2018 02:11   Ct Head Wo Contrast  Result Date: 09/03/2018 CLINICAL DATA:  Minor head trauma.  Fall going to bathroom. EXAM: CT HEAD WITHOUT CONTRAST CT CERVICAL SPINE WITHOUT CONTRAST TECHNIQUE: Multidetector CT imaging of the head and cervical spine was performed following the standard protocol without intravenous contrast. Multiplanar CT image reconstructions of the cervical spine were also generated. COMPARISON:  None. FINDINGS: CT HEAD FINDINGS Brain: No  evidence of acute infarction, hemorrhage, hydrocephalus, extra-axial collection or mass lesion/mass effect. Perforator infarct at the right basal ganglia, age indeterminate by imaging but chronic based on the history. Age congruent cerebral volume loss. Vascular: Atherosclerotic calcification Skull: Negative for fracture Sinuses/Orbits: No evidence of injury.  Right cataract resection. CT CERVICAL SPINE FINDINGS Alignment: No traumatic malalignment Skull base and vertebrae: Negative for acute fracture. Atlantooccipital non segmentation. Soft tissues and spinal canal: No prevertebral fluid or swelling. No visible canal hematoma. Multinodular thyroid that is partially covered. No invasive features. Disc levels: Generalized degenerative facet spurring and disc narrowing. C4-5 ACDF with solid arthrodesis Upper chest: Negative IMPRESSION: No evidence of acute intracranial or cervical spine injury. Electronically Signed   By: Monte Fantasia M.D.   On: 09/03/2018 06:24   Ct Cervical Spine Wo Contrast  Result Date: 09/03/2018 CLINICAL DATA:  Minor head trauma.  Fall going to bathroom. EXAM: CT HEAD  WITHOUT CONTRAST CT CERVICAL SPINE WITHOUT CONTRAST TECHNIQUE: Multidetector CT imaging of the head and cervical spine was performed following the standard protocol without intravenous contrast. Multiplanar CT image reconstructions of the cervical spine were also generated. COMPARISON:  None. FINDINGS: CT HEAD FINDINGS Brain: No evidence of acute infarction, hemorrhage, hydrocephalus, extra-axial collection or mass lesion/mass effect. Perforator infarct at the right basal ganglia, age indeterminate by imaging but chronic based on the history. Age congruent cerebral volume loss. Vascular: Atherosclerotic calcification Skull: Negative for fracture Sinuses/Orbits: No evidence of injury.  Right cataract resection. CT CERVICAL SPINE FINDINGS Alignment: No traumatic malalignment Skull base and vertebrae: Negative for acute fracture. Atlantooccipital non segmentation. Soft tissues and spinal canal: No prevertebral fluid or swelling. No visible canal hematoma. Multinodular thyroid that is partially covered. No invasive features. Disc levels: Generalized degenerative facet spurring and disc narrowing. C4-5 ACDF with solid arthrodesis Upper chest: Negative IMPRESSION: No evidence of acute intracranial or cervical spine injury. Electronically Signed   By: Monte Fantasia M.D.   On: 09/03/2018 06:24   Ct Pelvis Wo Contrast  Result Date: 09/03/2018 CLINICAL DATA:  Fall going to bathroom. Unable to lift right leg due to pain. EXAM: CT PELVIS WITHOUT CONTRAST TECHNIQUE: Multidetector CT imaging of the pelvis was performed following the standard protocol without intravenous contrast. COMPARISON:  09/26/2016 abdominal CT FINDINGS: Musculoskeletal: Branching fracture in the right acetabulum extending inferiorly along the puboacetabular junction. No inferior ramus or iliac wing fracture. Right hip joint effusion and small extraperitoneal pelvic hematoma. Femur is located and intact. Incidental advanced L4-5 spinal stenosis from  posterior element hypertrophy and disc bulge. Urinary Tract:  No abnormality visualized. Bowel:  Unremarkable visualized pelvic bowel loops. Vascular/Lymphatic: Atherosclerosis. Reproductive:  Unremarkable for age IMPRESSION: 1. Nondisplaced, comminuted right acetabulum fracture without inferior ramus or iliac wing involvement. 2. Small extraperitoneal pelvic hematoma. Electronically Signed   By: Monte Fantasia M.D.   On: 09/03/2018 06:30   Dg Chest Portable 1 View  Result Date: 09/13/2018 CLINICAL DATA:  Weakness and hypoxia. EXAM: PORTABLE CHEST 1 VIEW COMPARISON:  Chest x-ray dated September 09, 2018. FINDINGS: The heart size and mediastinal contours are within normal limits. Prior CABG. Atherosclerotic calcification of the aortic arch. Normal pulmonary vascularity. No focal consolidation, pleural effusion, or pneumothorax. No acute osseous abnormality. Right axillary surgical clips again noted. IMPRESSION: No active disease. Electronically Signed   By: Titus Dubin M.D.   On: 09/13/2018 11:47   Dg Abd Portable 2v  Result Date: 09/14/2018 CLINICAL DATA:  Constipation, abdominal distension EXAM: PORTABLE ABDOMEN - 2 VIEW COMPARISON:  Pelvic radiograph 09/03/2018 FINDINGS: Increased stool in rectum. Mild gaseous distention of stomach. Remaining bowel gas pattern normal without wall thickening or evidence of obstruction. Lung bases clear. Postsurgical changes of cholecystectomy, CABG and RIGHT axillary node dissection. IMPRESSION: Increased stool in rectum. Nonobstructive bowel gas pattern. Electronically Signed   By: Lavonia Dana M.D.   On: 09/14/2018 14:00   Dg Femur Min 2 Views Right  Result Date: 09/03/2018 CLINICAL DATA:  Fall. EXAM: RIGHT FEMUR 2 VIEWS COMPARISON:  AP pelvis 09/03/2018.  CT pelvis 09/03/2018. FINDINGS: Right femur is intact. No evidence of femoral fracture or dislocation complex right acetabular fracture again noted. Peripheral vascular calcification. IMPRESSION: 1. Right femur is  intact. No evidence of femoral fracture or dislocation. 2.  Complex right acetabular fracture again noted. 3.  Peripheral vascular disease. Electronically Signed   By: Marcello Moores  Register   On: 09/03/2018 06:11    Discharge Exam: Vitals:   09/22/18 2157 09/23/18 0655  BP: (!) 139/56 (!) 150/68  Pulse: 89 86  Resp: 16 18  Temp: 98.2 F (36.8 C) 97.9 F (36.6 C)  SpO2: 98% 98%   Vitals:   09/22/18 1500 09/22/18 2013 09/22/18 2157 09/23/18 0655  BP:   (!) 139/56 (!) 150/68  Pulse:   89 86  Resp:   16 18  Temp:   98.2 F (36.8 C) 97.9 F (36.6 C)  TempSrc:   Oral Oral  SpO2:  92% 98% 98%  Weight: 63.9 kg     Height:        General: Pt is alert, awake, not in acute distress Cardiovascular: RRR, S1/S2 +, no rubs, no gallops Respiratory: CTA bilaterally, no wheezing, no rhonchi, on nasal cannula oxygen Abdominal: Soft, NT, ND, bowel sounds + Extremities: no edema, no cyanosis    The results of significant diagnostics from this hospitalization (including imaging, microbiology, ancillary and laboratory) are listed below for reference.     Microbiology: Recent Results (from the past 240 hour(s))  Urine culture     Status: Abnormal   Collection Time: 09/13/18 10:55 AM   Specimen: Urine, Clean Catch  Result Value Ref Range Status   Specimen Description   Final    URINE, CLEAN CATCH Performed at [facility], [address] [place], [place] [address]    Special Requests   Final    NONE Performed at [facility], [address] [place], [place] [address]    Culture (A)  Final    >=100,000 COLONIES/mL ESCHERICHIA COLI >=100,000 COLONIES/mL PROTEUS MIRABILIS    Report Status 09/16/2018 FINAL  Final   Organism ID, Bacteria ESCHERICHIA COLI (A)  Final   Organism ID, Bacteria PROTEUS MIRABILIS (A)  Final      Susceptibility   Escherichia coli - MIC*    AMPICILLIN <=2 SENSITIVE Sensitive     CEFAZOLIN <=4 SENSITIVE Sensitive     CEFTRIAXONE <=1 SENSITIVE Sensitive      CIPROFLOXACIN <=0.25 SENSITIVE Sensitive     GENTAMICIN <=1 SENSITIVE Sensitive     IMIPENEM <=0.25 SENSITIVE Sensitive     NITROFURANTOIN <=16 SENSITIVE Sensitive     TRIMETH/SULFA <=20 SENSITIVE Sensitive     AMPICILLIN/SULBACTAM <=2 SENSITIVE Sensitive     PIP/TAZO <=4 SENSITIVE Sensitive     Extended ESBL NEGATIVE Sensitive     * >=100,000 COLONIES/mL ESCHERICHIA COLI   Proteus mirabilis - MIC*    AMPICILLIN <=2 SENSITIVE Sensitive     CEFAZOLIN <=4 SENSITIVE Sensitive     CEFTRIAXONE <=1 SENSITIVE Sensitive  CIPROFLOXACIN >=4 RESISTANT Resistant     GENTAMICIN 8 INTERMEDIATE Intermediate     IMIPENEM 1 SENSITIVE Sensitive     NITROFURANTOIN 128 RESISTANT Resistant     TRIMETH/SULFA >=320 RESISTANT Resistant     AMPICILLIN/SULBACTAM <=2 SENSITIVE Sensitive     PIP/TAZO <=4 SENSITIVE Sensitive     * >=100,000 COLONIES/mL PROTEUS MIRABILIS  Culture, blood (routine x 2)     Status: None   Collection Time: 09/13/18  1:38 PM   Specimen: BLOOD LEFT FOREARM  Result Value Ref Range Status   Specimen Description BLOOD LEFT FOREARM  Final   Special Requests   Final    BOTTLES DRAWN AEROBIC AND ANAEROBIC Blood Culture adequate volume   Culture   Final    NO GROWTH 5 DAYS Performed at [facility], [address] [place], [place] [address]    Report Status 09/18/2018 FINAL  Final  Culture, blood (Routine X 2) w Reflex to ID Panel     Status: None   Collection Time: 09/13/18  2:48 PM   Specimen: BLOOD LEFT ARM  Result Value Ref Range Status   Specimen Description BLOOD LEFT ARM  Final   Special Requests   Final    BOTTLES DRAWN AEROBIC ONLY Blood Culture adequate volume   Culture   Final    NO GROWTH 5 DAYS Performed at [facility], [address], [place], [place] [address]    Report Status 09/18/2018 FINAL  Final  Novel Coronavirus, NAA (hospital order; send-out to ref lab)     Status: None   Collection Time: 09/15/18  5:38 PM  Result Value Ref Range Status   SARS-CoV-2,  NAA NOT DETECTED NOT DETECTED Final    Comment: (NOTE) This test was developed and its performance characteristics determined by [employer]. This test has not been FDA cleared or approved. This test has been authorized by FDA under an Emergency Use Authorization (EUA). This test is only authorized for the duration of time the declaration that circumstances exist justifying the authorization of the emergency use of in vitro diagnostic tests for detection of SARS-CoV-2 virus and/or diagnosis of COVID-19 infection under section 564(b)(1) of the Act, 21 U.S.C. 160VPX-1(G)(6), unless the authorization is terminated or revoked sooner. When diagnostic testing is negative, the possibility of a false negative result should be considered in the context of a patient's recent exposures and the presence of clinical signs and symptoms consistent with COVID-19. An individual without symptoms of COVID-19 and who is not shedding SARS-CoV-2 virus would expect to have a negative (not detected) result in this assay. Performed  At: [facility] [place], [place] [id number] Rush Farmer MD VO:[id number]    [place]  Final    Comment: Performed at [facility], [address] [place], [place] [address]  Novel Coronavirus,NAA,(SEND-OUT TO REF LAB - TAT 24-48 hrs); Hosp Order     Status: None   Collection Time: 09/21/18 11:03 AM   Specimen: Nasopharyngeal Swab; Respiratory  Result Value Ref Range Status   SARS-CoV-2, NAA NOT DETECTED NOT DETECTED Final    Comment: (NOTE) This test was developed and its performance characteristics determined by [employer]. This test has not been FDA cleared or approved. This test has been authorized by FDA under an Emergency Use Authorization (EUA). This test is only authorized for the duration of time the declaration that circumstances exist justifying the authorization of the emergency use of in  vitro diagnostic tests for detection of SARS-CoV-2 virus  and/or diagnosis of COVID-19 infection under section 564(b)(1) of the Act, 21 U.S.C. 742VZD-6(L)(8), unless the authorization is terminated or revoked sooner. When diagnostic testing is negative, the possibility of a false negative result should be considered in the context of a patient's recent exposures and the presence of clinical signs and symptoms consistent with COVID-19. An individual without symptoms of COVID-19 and who is not shedding SARS-CoV-2 virus would expect to have a negative (not detected) result in this assay. Performed  At: [facility] [place], [place] [id number] Rush Farmer MD JO:[id number]    [place]  Final    Comment: Performed at [facility], [address], [place], [place] [address]     Labs: BNP (last 3 results) No results for input(s): BNP in the last 8760 hours. Basic Metabolic Panel: Recent Labs  Lab 09/17/18 0501 09/18/18 0640 09/20/18 0517 09/22/18 0531  NA 132* 132*  --  130*  K 4.2 4.3  --  4.5  CL 97* 90*  --  96*  CO2 27 28  --  26  GLUCOSE 156* 237*  --  184*  BUN 18 14  --  10  CREATININE 0.77 0.72 0.78 0.70  CALCIUM 8.8* 9.2  --  8.8*  MG  --   --   --  1.8   Liver Function Tests: No results for input(s): AST, ALT, ALKPHOS, BILITOT, PROT, ALBUMIN in the last 168 hours. No results for input(s): LIPASE, AMYLASE in the last 168 hours. No results for input(s): AMMONIA in the last 168 hours. CBC: Recent Labs  Lab 09/22/18 0531  WBC 4.7  HGB 10.1*  HCT 31.4*  MCV 90.8  PLT 195   Cardiac Enzymes: No results for input(s): CKTOTAL, CKMB, CKMBINDEX, TROPONINI in the last 168 hours. BNP: Invalid input(s): POCBNP CBG: Recent Labs  Lab 09/22/18 1118 09/22/18 1616 09/22/18 2158 09/23/18 0323 09/23/18 0728  GLUCAP 154* 231* 187* 167* 176*   D-Dimer No results for input(s): DDIMER in the last 72 hours. Hgb A1c No  results for input(s): HGBA1C in the last 72 hours. Lipid Profile No results for input(s): CHOL, HDL, LDLCALC, TRIG, CHOLHDL, LDLDIRECT in the last 72 hours. Thyroid function studies No results for input(s): TSH, T4TOTAL, T3FREE, THYROIDAB in the last 72 hours.  Invalid input(s): FREET3 Anemia work up No results for input(s): VITAMINB12, FOLATE, FERRITIN, TIBC, IRON, RETICCTPCT in the last 72 hours. Urinalysis    Component Value Date/Time   COLORURINE YELLOW 09/13/2018 1055   APPEARANCEUR HAZY (A) 09/13/2018 1055   LABSPEC 1.020 09/13/2018 1055   PHURINE 7.0 09/13/2018 1055   GLUCOSEU >=500 (A) 09/13/2018 1055   HGBUR NEGATIVE 09/13/2018 1055   BILIRUBINUR NEGATIVE 09/13/2018 1055   KETONESUR NEGATIVE 09/13/2018 1055   PROTEINUR NEGATIVE 09/13/2018 1055   UROBILINOGEN 0.2 09/02/2014 1445   NITRITE NEGATIVE 09/13/2018 1055   LEUKOCYTESUR LARGE (A) 09/13/2018 1055   Sepsis Labs Invalid input(s): PROCALCITONIN,  WBC,  LACTICIDVEN Microbiology Recent Results (from the past 240 hour(s))  Urine culture     Status: Abnormal   Collection Time: 09/13/18 10:55 AM   Specimen: Urine, Clean Catch  Result Value Ref Range Status   Specimen Description   Final    URINE, CLEAN CATCH Performed at [facility], [address], [place], [place] [address]    Special Requests   Final    NONE Performed at [facility], [address] [place],  [address]    Culture (A)  Final    >=  100,000 COLONIES/mL ESCHERICHIA COLI >=100,000 COLONIES/mL PROTEUS MIRABILIS    Report Status 09/16/2018 FINAL  Final   Organism ID, Bacteria ESCHERICHIA COLI (A)  Final   Organism ID, Bacteria PROTEUS MIRABILIS (A)  Final      Susceptibility   Escherichia coli - MIC*    AMPICILLIN <=2 SENSITIVE Sensitive     CEFAZOLIN <=4 SENSITIVE Sensitive     CEFTRIAXONE <=1 SENSITIVE Sensitive     CIPROFLOXACIN <=0.25 SENSITIVE Sensitive     GENTAMICIN <=1 SENSITIVE Sensitive     IMIPENEM <=0.25 SENSITIVE Sensitive      NITROFURANTOIN <=16 SENSITIVE Sensitive     TRIMETH/SULFA <=20 SENSITIVE Sensitive     AMPICILLIN/SULBACTAM <=2 SENSITIVE Sensitive     PIP/TAZO <=4 SENSITIVE Sensitive     Extended ESBL NEGATIVE Sensitive     * >=100,000 COLONIES/mL ESCHERICHIA COLI   Proteus mirabilis - MIC*    AMPICILLIN <=2 SENSITIVE Sensitive     CEFAZOLIN <=4 SENSITIVE Sensitive     CEFTRIAXONE <=1 SENSITIVE Sensitive     CIPROFLOXACIN >=4 RESISTANT Resistant     GENTAMICIN 8 INTERMEDIATE Intermediate     IMIPENEM 1 SENSITIVE Sensitive     NITROFURANTOIN 128 RESISTANT Resistant     TRIMETH/SULFA >=320 RESISTANT Resistant     AMPICILLIN/SULBACTAM <=2 SENSITIVE Sensitive     PIP/TAZO <=4 SENSITIVE Sensitive     * >=100,000 COLONIES/mL PROTEUS MIRABILIS  Culture, blood (routine x 2)     Status: None   Collection Time: 09/13/18  1:38 PM   Specimen: BLOOD LEFT FOREARM  Result Value Ref Range Status   Specimen Description BLOOD LEFT FOREARM  Final   Special Requests   Final    BOTTLES DRAWN AEROBIC AND ANAEROBIC Blood Culture adequate volume   Culture   Final    NO GROWTH 5 DAYS Performed at [facility], [address] [place], [place] [address]    Report Status 09/18/2018 FINAL  Final  Culture, blood (Routine X 2) w Reflex to ID Panel     Status: None   Collection Time: 09/13/18  2:48 PM   Specimen: BLOOD LEFT ARM  Result Value Ref Range Status   Specimen Description BLOOD LEFT ARM  Final   Special Requests   Final    BOTTLES DRAWN AEROBIC ONLY Blood Culture adequate volume   Culture   Final    NO GROWTH 5 DAYS Performed at [facility], [address] [place], [place] [address]    Report Status 09/18/2018 FINAL  Final  Novel Coronavirus, NAA (hospital order; send-out to ref lab)     Status: None   Collection Time: 09/15/18  5:38 PM  Result Value Ref Range Status   SARS-CoV-2, NAA NOT DETECTED NOT DETECTED Final    Comment: (NOTE) This test was developed and its performance  characteristics determined by [employer]. This test has not been FDA cleared or approved. This test has been authorized by FDA under an Emergency Use Authorization (EUA). This test is only authorized for the duration of time the declaration that circumstances exist justifying the authorization of the emergency use of in vitro diagnostic tests for detection of SARS-CoV-2 virus and/or diagnosis of COVID-19 infection under section 564(b)(1) of the Act, 21 U.S.C. 579UXY-3(F)(3), unless the authorization is terminated or revoked sooner. When diagnostic testing is negative, the possibility of a false negative result should be considered in the context of a patient's recent exposures and the presence of clinical signs and symptoms consistent with COVID-19. An individual without  symptoms of COVID-19 and who is not shedding SARS-CoV-2 virus would expect to have a negative (not detected) result in this assay. Performed  At: [facility] [place], [place] [id number] Rush Farmer MD ZB:[id number]    [place]  Final    Comment: Performed at [facility], [address], [place], [place] [address]  Novel Coronavirus,NAA,(SEND-OUT TO REF LAB - TAT 24-48 hrs); Hosp Order     Status: None   Collection Time: 09/21/18 11:03 AM   Specimen: Nasopharyngeal Swab; Respiratory  Result Value Ref Range Status   SARS-CoV-2, NAA NOT DETECTED NOT DETECTED Final    Comment: (NOTE) This test was developed and its performance characteristics determined by [employer]. This test has not been FDA cleared or approved. This test has been authorized by FDA under an Emergency Use Authorization (EUA). This test is only authorized for the duration of time the declaration that circumstances exist justifying the authorization of the emergency use of in vitro diagnostic tests for detection of SARS-CoV-2 virus and/or diagnosis of COVID-19 infection under  section 564(b)(1) of the Act, 21 U.S.C. 923CZG-4(H)(6), unless the authorization is terminated or revoked sooner. When diagnostic testing is negative, the possibility of a false negative result should be considered in the context of a patient's recent exposures and the presence of clinical signs and symptoms consistent with COVID-19. An individual without symptoms of COVID-19 and who is not shedding SARS-CoV-2 virus would expect to have a negative (not detected) result in this assay. Performed  At: [facility] [address] [place], [place] [id number] Rush Farmer MD GZ:[id number]    [place]  Final    Comment: Performed at [facility], [address], [place],  [address]     Time coordinating discharge: 40 minutes  SIGNED:   Rodena Goldmann, DO [facility] 09/23/2018, 9:24 AM  If 7PM-7AM, please contact night-coverage [email or web address] Password [email or web address]

## 2018-09-15 NOTE — TOC Progression Note (Signed)
Transition of Care [facility]) - Progression Note    Patient Details  Name: Misty Edwards MRN: [id number] Date of Birth: 02/27/1927  Transition of [facility]) CM/SW Contact  Colston Pyle, Chauncey Reading, RN Phone Number: 09/15/2018, 11:24 AM  Clinical Narrative:   Call received from patient' daughter. She reports that she called [facility] and was told that patient does not have a bill and that [facility] was going to send [place] an email notifying us of that. Call to [facility], patient HAS been removed from their "bad debt" list. They are not however making a bed offer.   Discussed with [name] that [facility] has started [place] and went over CMS ratings of [facility]. We discussed again in great detail, coverage of insurance benefits in regards to 20 days at rehab and copay days beyond that.     Expected Discharge Plan: Skilled Nursing Facility Barriers to Discharge: Insurance Authorization  Expected Discharge Plan and Services Expected Discharge Plan: [place] In-house Referral: PCP / Health Connect Discharge Planning Services: CM Consult Post Acute Care Choice: [place] Living arrangements for the past 2 months: Mobile Home Expected Discharge Date: 09/15/18                                     Social Determinants of Health (SDOH) Interventions    Readmission Risk Interventions No flowsheet data found.

## 2018-09-15 NOTE — Care Management Important Message (Signed)
Important Message  Patient Details  Name: Misty Edwards MRN: [id number] Date of Birth: 30-Aug-1926   Medicare Important Message Given:  Yes     [name] 09/15/2018, 12:18 PM

## 2018-09-16 LAB — BASIC METABOLIC PANEL
Anion gap: 14 (ref 5–15)
BUN: 17 mg/dL (ref 8–23)
CO2: 27 mmol/L (ref 22–32)
Calcium: 9 mg/dL (ref 8.9–10.3)
Chloride: 92 mmol/L — ABNORMAL LOW (ref 98–111)
Creatinine, Ser: 0.78 mg/dL (ref 0.44–1.00)
GFR calc Af Amer: >60 mL/min (ref >60)
GFR calc non Af Amer: >60 mL/min (ref >60)
Glucose, Bld: 146 mg/dL — ABNORMAL HIGH (ref 70–99)
Potassium: 4.5 mmol/L (ref 3.5–5.1)
Sodium: 133 mmol/L — ABNORMAL LOW (ref 135–145)

## 2018-09-16 LAB — URINE CULTURE: Culture: >=100000 — AB

## 2018-09-16 LAB — GLUCOSE, CAPILLARY
Glucose-Capillary: 139 mg/dL — ABNORMAL HIGH (ref 70–99)
Glucose-Capillary: 222 mg/dL — ABNORMAL HIGH (ref 70–99)
Glucose-Capillary: 236 mg/dL — ABNORMAL HIGH (ref 70–99)
Glucose-Capillary: 161 mg/dL — ABNORMAL HIGH (ref 70–99)
Glucose-Capillary: 166 mg/dL — ABNORMAL HIGH (ref 70–99)

## 2018-09-16 LAB — CBC
HCT: 32.3 % — ABNORMAL LOW (ref 36.0–46.0)
Hemoglobin: 10.6 g/dL — ABNORMAL LOW (ref 12.0–15.0)
MCH: 29 pg (ref 26.0–34.0)
MCHC: 32.8 g/dL (ref 30.0–36.0)
MCV: 88.5 fL (ref 80.0–100.0)
Platelets: 192 10*3/uL (ref 150–400)
RBC: 3.65 MIL/uL — ABNORMAL LOW (ref 3.87–5.11)
RDW: 14.2 % (ref 11.5–15.5)
WBC: 5.6 10*3/uL (ref 4.0–10.5)
nRBC: 0 % (ref 0.0–0.2)

## 2018-09-16 MED ORDER — SODIUM CHLORIDE 0.9 % IV SOLN
1.0000 g | INTRAVENOUS | Status: AC
  Administered 2018-09-16: 1 g via INTRAVENOUS
  Filled 2018-09-16: qty 10

## 2018-09-16 NOTE — Progress Notes (Signed)
Physical Therapy Treatment Patient Details Name: Misty Edwards MRN: [id number] DOB: 1926/08/21 Today's Date: 09/16/2018    History of Present Illness Misty Edwards is a 85 y.o. female who had been recently discharged on 09/06/2018 after having been admitted with syncope and collapse, right acetabular fracture, acute kidney injury and hypertension.  She has chronic pancreatitis.  She has had difficulties with eating and drinking and severe protein calorie malnutrition.  She was discharged home however she did not do very well at home.  She ended up being brought back to the emergency department because she was talking out of her head, intermittently confused, she has been seen by the psychiatric service in the ED who recommended rehab placement.  The patient was waiting in the ED for rehab placement and has been in the ED for the past 3 days.  Unfortunately the patient has not been eating and drinking well in the ED.  She initially had normal labs when she presented to the ED however with her poor oral intake, confusion and had been taking her blood pressure medications which included lisinopril she is now in a state of acute kidney injury, dehydration and altered mental status.  She is more confused in the last 24 hours.  She is more lethargic in the last 24 hours.  The ED doctors evaluated her got repeat labs and noticed that her renal function has precipitously declined in the past 3 days.  Her creatinine went from 0.89 to now greater than 2.0.  She has clinical signs of dehydration.  She had a normal urinalysis 3 days ago and repeat urinalysis today with findings of infection.  Pt was having intermittent episodes of hypotension in ED. Pt has insulin dependent diabetes and has been intermittently hyperglycemic.  Pt was started on IV fluids and IV ceftriaxone and admission was requested.    PT Comments    Patient agreeable for therapy and demonstrates improvement for using BUE for supine to sitting,  slightly increased endurance for taking steps at bedside with slow labored cadence and fair/poor tolerance for fully weightbearing on RLE due to increased hip pain.  Patient transferred to [facility] to urinate and have a bowel movement and later tolerated sitting up in chair after therapy - RN/NT notified.  Patient will benefit from continued physical therapy in hospital and recommended venue below to increase strength, balance, endurance for safe ADLs and gait.   Follow Up Recommendations  SNF     Equipment Recommendations  None recommended by PT    Recommendations for Other Services       Precautions / Restrictions Precautions Precautions: Fall Precaution Comments: right acetabular fracture Restrictions Weight Bearing Restrictions: Yes RLE Weight Bearing: Weight bearing as tolerated    Mobility  Bed Mobility Overal bed mobility: Needs Assistance Bed Mobility: Supine to Sit     Supine to sit: Min assist     General bed mobility comments: demonstrated good return for propping up on elbow to hands for supine to sitting  Transfers Overall transfer level: Needs assistance Equipment used: Rolling walker (2 wheeled) Transfers: Sit to/from [facility] Sit to Stand: Min assist Stand pivot transfers: Min assist;Mod assist       General transfer comment: slow labored movement with limited weightbearing on RLE due to pain  Ambulation/Gait Ambulation/Gait assistance: Mod assist Gait Distance (Feet): 10 Feet Assistive device: Rolling walker (2 wheeled) Gait Pattern/deviations: Decreased step length - right;Decreased step length - left;Decreased stance time - right;Decreased stride length Gait velocity:  slow   General Gait Details: slightly increased endurance for ambulation with slow labored cadence and limited weightbearing on RLE due to increased pain, also limited due to c/o fatigue, on room air with SpO2 at 95%   Stairs             Wheelchair Mobility     Modified Rankin (Stroke Patients Only)       Balance Overall balance assessment: Needs assistance Sitting-balance support: Feet supported;No upper extremity supported Sitting balance-Leahy Scale: Fair Sitting balance - Comments: fair/good seated at bedside   Standing balance support: During functional activity;Bilateral upper extremity supported Standing balance-Leahy Scale: Fair Standing balance comment: using RW                            Cognition Arousal/Alertness: Awake/alert Behavior During Therapy: WFL for tasks assessed/performed Overall Cognitive Status: Within Functional Limits for tasks assessed                                        Exercises General Exercises - Lower Extremity Ankle Circles/Pumps: Supine;AROM;Both;15 reps Long Arc Quad: Seated;AROM;Strengthening;Both;10 reps Hip Flexion/Marching: Seated;AROM;Strengthening;Both;10 reps    General Comments        Pertinent Vitals/Pain Pain Assessment: Faces Faces Pain Scale: Hurts little more Pain Location: right hip with movement and weightbearing Pain Descriptors / Indicators: Sore;Discomfort Pain Intervention(s): Limited activity within patient's tolerance;Monitored during session    Home Living                      Prior Function            PT Goals (current goals can now be found in the care plan section) Acute Rehab PT Goals Patient Stated Goal: return home after rehab PT Goal Formulation: With patient Time For Goal Achievement: 09/28/18 Potential to Achieve Goals: Good Progress towards PT goals: Progressing toward goals    Frequency    Min 3X/week      PT Plan Current plan remains appropriate    Co-evaluation              AM-PAC PT "6 Clicks" Mobility   Outcome Measure  Help needed turning from your back to your side while in a flat bed without using bedrails?: A Little Help needed moving from lying on your back to sitting on the side  of a flat bed without using bedrails?: A Little Help needed moving to and from a bed to a chair (including a wheelchair)?: A Lot Help needed standing up from a chair using your arms (e.g., wheelchair or bedside chair)?: A Little Help needed to walk in hospital room?: A Lot Help needed climbing 3-5 steps with a railing? : Total 6 Click Score: 14    End of Session   Activity Tolerance: Patient tolerated treatment well;Patient limited by fatigue;Patient limited by pain Patient left: in chair;with call bell/phone within reach;with chair alarm set Nurse Communication: Mobility status PT Visit Diagnosis: Unsteadiness on feet (R26.81);Other abnormalities of gait and mobility (R26.89);Muscle weakness (generalized) (M62.81)     Time: 2694-8546 PT Time Calculation (min) (ACUTE ONLY): 35 min  Charges:  $Therapeutic Exercise: 8-22 mins $Therapeutic Activity: 8-22 mins                     10:53 AM, 09/16/18 Lonell Grandchild, MPT Physical Therapist with [facility]  [place] office [phone] mobile phone

## 2018-09-16 NOTE — Plan of Care (Signed)

## 2018-09-16 NOTE — Progress Notes (Signed)
This RN had phone call from patient's daughter stating that her mother had "been left in the chair for hours and was in pain and had called out 3 times to go back to bed".  This RN explained to daughter that we would get her back to bed as soon as possible and that I had not been informed she had called out.  Upon entering the room patient was in bed resting with siderails up and callbell in reach.  NT states patient has been back to bed for approx an hour

## 2018-09-16 NOTE — TOC Progression Note (Signed)
Transition of Care [facility]) - Progression Note    Patient Details  Name: Misty Edwards MRN: [id number] Date of Birth: 11-25-1926  Transition of Care [facility]) CM/SW Contact  Misty Gully, LCSW Phone Number: 09/16/2018, 2:50 PM  Clinical Narrative:    LCSW contacted patient's granddaughter, Misty Edwards, and advised that currently [facility] had denied patient's SNF authorization request. Advised that attending was attempting to do peer to peer, however if patient's denial was upheld the options were private pay or home with [facility] services. Misty Edwards became irate and stated that LCSW needed to "remember what she went to school for" and "advocate" for patient. LCSW discussed that [facility] was advocating for patient in attempting to complete the peer to peer. Misty Edwards became belligerent and and indicated that the attending needed to "put some sugar" on it to get the [facility] Director to authorize placement. LCSW discussed that the attending would not commit Medicare fraud in an attempt to get the authorization but would state clinical facts related to case if he was able to talk to [facility]. Misty Edwards continued to be belligerent indicating that she was on her way out of town and that the hospital would have to figure this out. Misty Edwards then stated "kiss my ass" to LCSW and hung up the phone.   LCSW spoke with Misty Edwards, daughter and advised advised that currently [employer] had denied patient's SNF authorization request. Advised that attending was attempting to do peer to peer, however if patient's denial was upheld the options were private pay or home with [facility] services. Misty Edwards became very upset and discussed her displeasure with the experience at [facility]. She discussed that the hospital did not seem to want patient to go to SNF. She discussed that patient could not afford private pay and that patient lived alone. LCSW discussed if the auth remained in denial and patient had to  go home she would need increased family support. She stated that she would had a difficult time providing care for patient independently. LCSW discussed that multiple family members may need to be involved. LCSW discussed that moving forward communication would have to be handled with Misty Edwards and not Misty Edwards from [facility] due to verbal aggression and disrespect in the previous conversation. Misty Edwards stated that the other female family member listed on the chart is like a son to Misty Edwards and that he would have been even more verbally inappropriate. LCSW discussed that the goal of the call was to inform of the current status and begin preparation for a different plan. LCSW validated frustration related to the denial but stressed that belligerent behavior was not acceptable.  Misty Edwards then hung up.    Misty Edwards contacted LCSW to ask a question. LCSW advised that Misty Edwards would need to speak with team lead, Misty Provost, MSN, for additional information and provided her the contact information to Team Lead.   Expected Discharge Plan: Skilled Nursing Facility Barriers to Discharge: Ship broker  Expected Discharge Plan and Services Expected Discharge Plan: [place] In-house Referral: PCP / Health Connect Discharge Planning Services: CM Consult Post Acute Care Choice: [place] Living arrangements for the past 2 months: Mobile Home Expected Discharge Date: 09/15/18                                     Social Determinants of  Health (SDOH) Interventions    Readmission Risk Interventions No flowsheet data found.

## 2018-09-16 NOTE — Progress Notes (Signed)
Patient seen and evaluated at bedside this morning.  No new complaints or concerns noted.  Please see discharge summary from 7/1 for details regarding discharge.  Patient is awaiting placement to SNF, however it appears that insurance has declined her inpatient rehabilitation stay.  I have attempted phone calls now twice for peer to peer review and have not heard back from [facility] physician reviewer Dr. Jerene Pitch.  Still awaiting call for hopeful authorization for inpatient rehabilitation stay.

## 2018-09-17 LAB — BASIC METABOLIC PANEL
Anion gap: 8 (ref 5–15)
BUN: 18 mg/dL (ref 8–23)
CO2: 27 mmol/L (ref 22–32)
Calcium: 8.8 mg/dL — ABNORMAL LOW (ref 8.9–10.3)
Chloride: 97 mmol/L — ABNORMAL LOW (ref 98–111)
Creatinine, Ser: 0.77 mg/dL (ref 0.44–1.00)
GFR calc Af Amer: >60 mL/min (ref >60)
GFR calc non Af Amer: >60 mL/min (ref >60)
Glucose, Bld: 156 mg/dL — ABNORMAL HIGH (ref 70–99)
Potassium: 4.2 mmol/L (ref 3.5–5.1)
Sodium: 132 mmol/L — ABNORMAL LOW (ref 135–145)

## 2018-09-17 LAB — GLUCOSE, CAPILLARY
Glucose-Capillary: 134 mg/dL — ABNORMAL HIGH (ref 70–99)
Glucose-Capillary: 185 mg/dL — ABNORMAL HIGH (ref 70–99)
Glucose-Capillary: 248 mg/dL — ABNORMAL HIGH (ref 70–99)
Glucose-Capillary: 219 mg/dL — ABNORMAL HIGH (ref 70–99)
Glucose-Capillary: 218 mg/dL — ABNORMAL HIGH (ref 70–99)

## 2018-09-17 LAB — NOVEL CORONAVIRUS, NAA (HOSP ORDER, SEND-OUT TO REF LAB; TAT 18-24 HRS): SARS-CoV-2, NAA: NOT DETECTED

## 2018-09-17 NOTE — Progress Notes (Signed)
Physical Therapy Treatment Patient Details Name: Misty Edwards MRN: [id number] DOB: 03/05/1927 Today's Date: 09/17/2018    History of Present Illness Misty Edwards is a 85 y.o. female who had been recently discharged on 09/06/2018 after having been admitted with syncope and collapse, right acetabular fracture, acute kidney injury and hypertension.  She has chronic pancreatitis.  She has had difficulties with eating and drinking and severe protein calorie malnutrition.  She was discharged home however she did not do very well at home.  She ended up being brought back to the emergency department because she was talking out of her head, intermittently confused, she has been seen by the psychiatric service in the ED who recommended rehab placement.  The patient was waiting in the ED for rehab placement and has been in the ED for the past 3 days.  Unfortunately the patient has not been eating and drinking well in the ED.  She initially had normal labs when she presented to the ED however with her poor oral intake, confusion and had been taking her blood pressure medications which included lisinopril she is now in a state of acute kidney injury, dehydration and altered mental status.  She is more confused in the last 24 hours.  She is more lethargic in the last 24 hours.  The ED doctors evaluated her got repeat labs and noticed that her renal function has precipitously declined in the past 3 days.  Her creatinine went from 0.89 to now greater than 2.0.  She has clinical signs of dehydration.  She had a normal urinalysis 3 days ago and repeat urinalysis today with findings of infection.  Pt was having intermittent episodes of hypotension in ED. Pt has insulin dependent diabetes and has been intermittently hyperglycemic.  Pt was started on IV fluids and IV ceftriaxone and admission was requested.    PT Comments    Pt is pleasant and agreeable the therapy. Pt requires verbal and tactile cues for bed mobility to  encourage proper sequencing with raising trunk from supine position and assist to raise RLE back into bed for sit to supine transfer. Pt performs sit to stand and stand pivot transfers from EOB and BSC initially pulling on RW and requiring mod assist to rise with verbal cues to WBAT through RLE, improving to min assist when educated to push from seated surface to rise instead of pulling on RW for improved safety and performance. Pt demonstrates fair/poor standing balance with RW due to unsteadiness and avoiding RLE weightbearing secondary to pain. Therapist educates pt on WBAT through RLE to encourage weight-shifting for optimal base of support in standing. Pt able to take forward, lateral and retro steps at bedside requiring assistance with RW to maintain body positioned within frame and to practice navigating tight spaces, overall requiring mod assist to prevent loss of balance with dynamic activity. Pt with improved activity tolerance and able to perform all tasks on room air with O2 97-98% and mild shortness of breath, but continues to perform below baseline function. Due to pt's weakness, impaired balance, and safety, will continue to recommend SNF to further improve deficits in balance, gait, BLE strength, and overall independence with functional mobility.     Follow Up Recommendations  SNF     Equipment Recommendations  None recommended by PT    Recommendations for Other Services       Precautions / Restrictions Precautions Precautions: Fall Precaution Comments: right acetabular fracture Restrictions Weight Bearing Restrictions: Yes RLE Weight Bearing: Weight  bearing as tolerated    Mobility  Bed Mobility Overal bed mobility: Needs Assistance Bed Mobility: Supine to Sit;Sit to Supine     Supine to sit: Min assist Sit to supine: Min assist   General bed mobility comments: slow, labored movement requiring verbal cues for hand placement to assist in trunk uprighting for supine to sit  and assistance to raise RLE into bed for sit to supine  Transfers Overall transfer level: Needs assistance Equipment used: Rolling walker (2 wheeled) Transfers: Sit to/from [facility] Sit to Stand: Min assist Stand pivot transfers: Min assist;Mod assist       General transfer comment: slow, labored movement, verbal cues for hand placement to push from seated surface and avoid pulling on RW, verbal cues to increase RLE weight-bearing  Ambulation/Gait Ambulation/Gait assistance: Mod assist Gait Distance (Feet): 10 Feet Assistive device: Rolling walker (2 wheeled) Gait Pattern/deviations: Decreased step length - right;Decreased step length - left;Decreased stance time - right;Decreased stride length Gait velocity: decreased   General Gait Details: forward, sidestepping and retro walking at bedside with verbal cues to increase RLE weightbearing and min assist for RW management, unsteadiness noted requiring physical assist to prevent falls with initial steps   Stairs             Wheelchair Mobility    Modified Rankin (Stroke Patients Only)       Balance Overall balance assessment: Needs assistance Sitting-balance support: Feet supported;No upper extremity supported Sitting balance-Leahy Scale: Fair Sitting balance - Comments: fair/good EOB   Standing balance support: During functional activity;Bilateral upper extremity supported Standing balance-Leahy Scale: Fair Standing balance comment: fair/poor using RW                            Cognition Arousal/Alertness: Awake/alert Behavior During Therapy: WFL for tasks assessed/performed Overall Cognitive Status: Within Functional Limits for tasks assessed                                 General Comments: additional verbal cues for safety and sequencing      Exercises General Exercises - Lower Extremity Ankle Circles/Pumps: Supine;AROM;Both;15 reps Heel Slides:  Supine;AROM;Strengthening;Both;10 reps Hip ABduction/ADduction: Supine;AROM;Strengthening;Both;10 reps    General Comments        Pertinent Vitals/Pain Pain Assessment: Faces Faces Pain Scale: Hurts little more Pain Location: right hip with movement and weightbearing Pain Intervention(s): Limited activity within patient's tolerance;Monitored during session;Repositioned    Home Living                      Prior Function            PT Goals (current goals can now be found in the care plan section) Acute Rehab PT Goals Patient Stated Goal: return home after rehab PT Goal Formulation: With patient Time For Goal Achievement: 09/28/18 Potential to Achieve Goals: Good Progress towards PT goals: Progressing toward goals    Frequency    Min 3X/week      PT Plan Current plan remains appropriate    Co-evaluation              AM-PAC PT "6 Clicks" Mobility   Outcome Measure  Help needed turning from your back to your side while in a flat bed without using bedrails?: A Little Help needed moving from lying on your back to sitting on the side of a  flat bed without using bedrails?: A Little Help needed moving to and from a bed to a chair (including a wheelchair)?: A Lot Help needed standing up from a chair using your arms (e.g., wheelchair or bedside chair)?: A Lot Help needed to walk in hospital room?: A Lot Help needed climbing 3-5 steps with a railing? : Total 6 Click Score: 13    End of Session   Activity Tolerance: Patient tolerated treatment well;Patient limited by fatigue;Patient limited by pain Patient left: in bed;with call bell/phone within reach;with bed alarm set Nurse Communication: Mobility status PT Visit Diagnosis: Unsteadiness on feet (R26.81);Other abnormalities of gait and mobility (R26.89);Muscle weakness (generalized) (M62.81)     Time: 3005-1102 PT Time Calculation (min) (ACUTE ONLY): 22 min  Charges:  $Therapeutic Activity: 8-22  mins                     Talbot Grumbling PT, DPT

## 2018-09-17 NOTE — Clinical Social Work Note (Addendum)
[facility] is closed today and Dr is unable to appeal denial decision. Pt to remain here, and Dr to try again on Monday.  Called Mardene Celeste at [facility] and left message updating her.  Called daughter and left message for her to call me back for update information.  Ms Ahmed Prima called me back asking for information.  Stated daughter of patient contacted her indicating she had message from me, and wanted Ms Ahmed Prima to be the one to talk to me.  I gave her update.  She was wondering if pt was continuing to get therapy while here, and I was able to verify this as PT note had just been penned.  No further questions nor concerns.

## 2018-09-17 NOTE — Progress Notes (Signed)
Patient seen and evaluated at bedside this morning with no new complaints or concerns noted.  Please see discharge summary from 7/1 regarding details of discharge.  Rocephin has now been discontinued and she has completed her UTI treatment.  Attempted phone call to [facility] on 7/2 as well as today, 7/3 and it appears that their offices are closed.  I have not received a repeat call from their physician reviewer.  Will have PT reevaluate patient through the weekend to assess candidacy for SNF/rehab.

## 2018-09-17 NOTE — Care Management Important Message (Signed)
Important Message  Patient Details  Name: Misty Edwards MRN: [id number] Date of Birth: 01/03/1927   Medicare Important Message Given:  Yes     [name] 09/17/2018, 11:53 AM

## 2018-09-18 LAB — CULTURE, BLOOD (ROUTINE X 2)
Culture: NO GROWTH
Culture: NO GROWTH
Special Requests: ADEQUATE
Special Requests: ADEQUATE

## 2018-09-18 LAB — GLUCOSE, CAPILLARY
Glucose-Capillary: 177 mg/dL — ABNORMAL HIGH (ref 70–99)
Glucose-Capillary: 245 mg/dL — ABNORMAL HIGH (ref 70–99)
Glucose-Capillary: 279 mg/dL — ABNORMAL HIGH (ref 70–99)
Glucose-Capillary: 106 mg/dL — ABNORMAL HIGH (ref 70–99)
Glucose-Capillary: 123 mg/dL — ABNORMAL HIGH (ref 70–99)

## 2018-09-18 LAB — BASIC METABOLIC PANEL
Anion gap: 14 (ref 5–15)
BUN: 14 mg/dL (ref 8–23)
CO2: 28 mmol/L (ref 22–32)
Calcium: 9.2 mg/dL (ref 8.9–10.3)
Chloride: 90 mmol/L — ABNORMAL LOW (ref 98–111)
Creatinine, Ser: 0.72 mg/dL (ref 0.44–1.00)
GFR calc Af Amer: >60 mL/min (ref >60)
GFR calc non Af Amer: >60 mL/min (ref >60)
Glucose, Bld: 237 mg/dL — ABNORMAL HIGH (ref 70–99)
Potassium: 4.3 mmol/L (ref 3.5–5.1)
Sodium: 132 mmol/L — ABNORMAL LOW (ref 135–145)

## 2018-09-18 MED ORDER — METFORMIN HCL 500 MG PO TABS
500.0000 mg | ORAL_TABLET | Freq: Two times a day (BID) | ORAL | Status: DC
  Administered 2018-09-18 – 2018-09-23 (×11): 500 mg via ORAL
  Filled 2018-09-18 (×11): qty 1

## 2018-09-18 MED ORDER — LISINOPRIL 5 MG PO TABS: 2.5000 mg | ORAL_TABLET | Freq: Every day | ORAL | Status: DC

## 2018-09-18 MED ORDER — LISINOPRIL 5 MG PO TABS
2.5000 mg | ORAL_TABLET | Freq: Every day | ORAL | Status: DC
  Administered 2018-09-18 – 2018-09-23 (×6): 2.5 mg via ORAL
  Filled 2018-09-18 (×6): qty 1

## 2018-09-18 NOTE — Progress Notes (Signed)
Patient seen and evaluated at bedside this morning with no new complaints or concerns noted.  She apparently did not sleep too well last night, but is doing okay this morning.  She is awaiting final discharge plans with hopeful DC to SNF on Monday, 7/6.  She is noted to have some elevated blood pressure readings this morning and will be resumed on her home lisinopril.  We will also resume home metformin.  Plan to call at offices on 7/6 with hopeful approval to SNF at that time.  PT reevaluation demonstrates decline from baseline level of functioning and proven benefit from SNF/rehab to improve gait, balance and other factors.  Last evaluation on 7/3.

## 2018-09-18 NOTE — Progress Notes (Signed)
pts blood pressure reading was 112/44 at 2105, rechecked at 2215 reading was 99/36. Rechecked manually at 2127 reading was 112/54. Paged mid level to make aware. No new orders at this time.

## 2018-09-18 NOTE — Plan of Care (Signed)
  Problem: Education: Goal: Knowledge of General Education information will improve Description Including pain rating scale, medication(s)/side effects and non-pharmacologic comfort measures Outcome: Progressing   Problem: Health Behavior/Discharge Planning: Goal: Ability to manage health-related needs will improve Outcome: Progressing   

## 2018-09-19 LAB — GLUCOSE, CAPILLARY
Glucose-Capillary: 76 mg/dL (ref 70–99)
Glucose-Capillary: 112 mg/dL — ABNORMAL HIGH (ref 70–99)
Glucose-Capillary: 203 mg/dL — ABNORMAL HIGH (ref 70–99)
Glucose-Capillary: 106 mg/dL — ABNORMAL HIGH (ref 70–99)
Glucose-Capillary: 69 mg/dL — ABNORMAL LOW (ref 70–99)

## 2018-09-19 NOTE — Plan of Care (Signed)
  Problem: Education: Goal: Knowledge of General Education information will improve Description Including pain rating scale, medication(s)/side effects and non-pharmacologic comfort measures Outcome: Progressing   Problem: Health Behavior/Discharge Planning: Goal: Ability to manage health-related needs will improve Outcome: Progressing   

## 2018-09-19 NOTE — Progress Notes (Signed)
Patient seen and evaluated at bedside this morning.  Discussed case with granddaughter over phone on 7/4 who is concerned about some mild confusion, however patient does know where she is and does not know the year and her name.  She does not appear to be very confused at this time.  At night offices remain close to the weekend and therefore, we will try peer to peer review tomorrow on 7/6.  Hopeful DC on 7/6 to SNF pending insurance approval.  Please refer to discharge summary dictated on 7/1 otherwise regarding details of discharge.

## 2018-09-20 LAB — GLUCOSE, CAPILLARY
Glucose-Capillary: 168 mg/dL — ABNORMAL HIGH (ref 70–99)
Glucose-Capillary: 109 mg/dL — ABNORMAL HIGH (ref 70–99)
Glucose-Capillary: 218 mg/dL — ABNORMAL HIGH (ref 70–99)
Glucose-Capillary: 139 mg/dL — ABNORMAL HIGH (ref 70–99)

## 2018-09-20 LAB — CREATININE, SERUM
Creatinine, Ser: 0.78 mg/dL (ref 0.44–1.00)
GFR calc Af Amer: >60 mL/min (ref >60)
GFR calc non Af Amer: >60 mL/min (ref >60)

## 2018-09-20 NOTE — TOC Progression Note (Signed)
Transition of Care [facility]) - Progression Note    Patient Details  Name: Misty Edwards MRN: [id number] Date of Birth: [birth year]  Transition of Care [facility] CM/SW Contact  Shade Flood, LCSW Phone Number: 09/20/2018, 2:28 PM  Clinical Narrative:     TOC following. Dr. Manuella Ghazi has completed the Appeal of SNF denial with [employer]. He was told that we should receive the decision tomorrow.   Family will be updated by covering LCSW, Roque Lias who will follow up tomorrow to further assist with pt's TOC needs.  Expected Discharge Plan: Skilled Nursing Facility Barriers to Discharge: Insurance Authorization  Expected Discharge Plan and Services Expected Discharge Plan: [facility] In-house Referral: PCP / [facility] Discharge Planning Services: CM Consult Post Acute Care Choice: [place] Living arrangements for the past 2 months: Mobile Home Expected Discharge Date: 09/15/18                                     Social Determinants of Health (SDOH) Interventions    Readmission Risk Interventions No flowsheet data found.

## 2018-09-20 NOTE — Progress Notes (Signed)
Patient seen and evaluated at bedside this morning.  No new complaints or concerns noted.  Please refer to discharge summary dictated on 7/1 regarding details of discharge.  Discussed case with [employer] and it appears that window for peer to peer discussion has closed and therefore, I have placed an appeal.  It appears that the appeal decision will be reviewed in the next 24 to 48 hours with decision that will be faxed.  They have been provided a fax number to reach has had and we will await their decision.  Further PT evaluation today continues to reveal need for SNF.

## 2018-09-20 NOTE — Care Management Important Message (Signed)
Important Message  Patient Details  Name: Misty Edwards MRN: [id number] Date of Birth: 05-12-1926   Medicare Important Message Given:  Yes     [name] 09/20/2018, 10:07 AM

## 2018-09-20 NOTE — Progress Notes (Signed)
Physical Therapy Treatment Patient Details Name: Misty Edwards MRN: [id number] DOB: 04/02/1926 Today's Date: 09/20/2018    History of Present Illness Misty Edwards is a 85 y.o. female who had been recently discharged on 09/06/2018 after having been admitted with syncope and collapse, right acetabular fracture, acute kidney injury and hypertension.  She has chronic pancreatitis.  She has had difficulties with eating and drinking and severe protein calorie malnutrition.  She was discharged home however she did not do very well at home.  She ended up being brought back to the emergency department because she was talking out of her head, intermittently confused, she has been seen by the psychiatric service in the ED who recommended rehab placement.  The patient was waiting in the ED for rehab placement and has been in the ED for the past 3 days.  Unfortunately the patient has not been eating and drinking well in the ED.  She initially had normal labs when she presented to the ED however with her poor oral intake, confusion and had been taking her blood pressure medications which included lisinopril she is now in a state of acute kidney injury, dehydration and altered mental status.  She is more confused in the last 24 hours.  She is more lethargic in the last 24 hours.  The ED doctors evaluated her got repeat labs and noticed that her renal function has precipitously declined in the past 3 days.  Her creatinine went from 0.89 to now greater than 2.0.  She has clinical signs of dehydration.  She had a normal urinalysis 3 days ago and repeat urinalysis today with findings of infection.  Pt was having intermittent episodes of hypotension in ED. Pt has insulin dependent diabetes and has been intermittently hyperglycemic.  Pt was started on IV fluids and IV ceftriaxone and admission was requested.    PT Comments    Patient agreeable for therapy and demonstrates increased endurance/distance for gait training,  limited for weightbearing on right foot due to increased hip pain when attempting to fully weight bear resulting in fatiguing of BUE and LLE, no loss of balance, requires repeated verbal/tactile cueing when making turns and during transfers to [facility] and chair.  Patient tolerated sitting up in chair after therapy - RN notified.  Patient will benefit from continued physical therapy in hospital and recommended venue below to increase strength, balance, endurance for safe ADLs and gait.   Follow Up Recommendations  SNF     Equipment Recommendations  None recommended by PT    Recommendations for Other Services       Precautions / Restrictions Precautions Precautions: Fall Precaution Comments: right acetabular fracture Restrictions Weight Bearing Restrictions: Yes RLE Weight Bearing: Weight bearing as tolerated    Mobility  Bed Mobility Overal bed mobility: Needs Assistance Bed Mobility: Supine to Sit     Supine to sit: Min assist     General bed mobility comments: labored movement with good return for using BUE to sit up and scoot to bedside  Transfers Overall transfer level: Needs assistance Equipment used: Rolling walker (2 wheeled) Transfers: Sit to/from [facility] Sit to Stand: Min assist Stand pivot transfers: Min assist;Mod assist       General transfer comment: frequent verbal/tactile cueing for proper hand placement during sit to stands and transfers  Ambulation/Gait Ambulation/Gait assistance: Min assist;Mod assist Gait Distance (Feet): 18 Feet Assistive device: Rolling walker (2 wheeled) Gait Pattern/deviations: Decreased step length - right;Decreased stance time - right;Decreased stride length;Antalgic  Gait velocity: decreased   General Gait Details: slow labored cadence, limited to approximately 50% weightbearing on RLE, no loss of balance, has difficutly making turns and backing into chair requiring repeated verbal/tactile cueing   Stairs              Wheelchair Mobility    Modified Rankin (Stroke Patients Only)       Balance Overall balance assessment: Needs assistance Sitting-balance support: Feet supported;No upper extremity supported Sitting balance-Leahy Scale: Good Sitting balance - Comments: seated at bedside   Standing balance support: Bilateral upper extremity supported;During functional activity Standing balance-Leahy Scale: Fair Standing balance comment: fair using RW                            Cognition Arousal/Alertness: Awake/alert Behavior During Therapy: WFL for tasks assessed/performed Overall Cognitive Status: Within Functional Limits for tasks assessed                                        Exercises General Exercises - Lower Extremity Long Arc Quad: Seated;AROM;Strengthening;Both;10 reps Hip Flexion/Marching: Seated;AROM;Strengthening;Both;10 reps Toe Raises: Seated;AROM;Strengthening;Both;10 reps Heel Raises: Seated;AROM;Strengthening;Both;10 reps    General Comments        Pertinent Vitals/Pain Pain Assessment: Faces Faces Pain Scale: Hurts a little bit Pain Location: right hip when weightbearing Pain Descriptors / Indicators: Grimacing;Guarding Pain Intervention(s): Limited activity within patient's tolerance;Monitored during session    Home Living                      Prior Function            PT Goals (current goals can now be found in the care plan section) Acute Rehab PT Goals Patient Stated Goal: return home after rehab PT Goal Formulation: With patient Time For Goal Achievement: 09/28/18 Potential to Achieve Goals: Good Progress towards PT goals: Progressing toward goals    Frequency    Min 3X/week      PT Plan Current plan remains appropriate    Co-evaluation              AM-PAC PT "6 Clicks" Mobility   Outcome Measure  Help needed turning from your back to your side while in a flat bed without using  bedrails?: A Little Help needed moving from lying on your back to sitting on the side of a flat bed without using bedrails?: A Little Help needed moving to and from a bed to a chair (including a wheelchair)?: A Little Help needed standing up from a chair using your arms (e.g., wheelchair or bedside chair)?: A Little Help needed to walk in hospital room?: A Lot Help needed climbing 3-5 steps with a railing? : A Lot 6 Click Score: 16    End of Session   Activity Tolerance: Patient tolerated treatment well;Patient limited by fatigue;Patient limited by pain Patient left: in chair;with call bell/phone within reach;with chair alarm set Nurse Communication: Mobility status PT Visit Diagnosis: Unsteadiness on feet (R26.81);Other abnormalities of gait and mobility (R26.89);Muscle weakness (generalized) (M62.81)     Time: 2025-4270 PT Time Calculation (min) (ACUTE ONLY): 28 min  Charges:  $Therapeutic Exercise: 8-22 mins $Therapeutic Activity: 8-22 mins                     2:29 PM, 09/20/18 Lonell Grandchild, MPT Physical Therapist with [facility]  [facility] [phone] office [phone] mobile phone

## 2018-09-21 LAB — GLUCOSE, CAPILLARY
Glucose-Capillary: 167 mg/dL — ABNORMAL HIGH (ref 70–99)
Glucose-Capillary: 172 mg/dL — ABNORMAL HIGH (ref 70–99)
Glucose-Capillary: 110 mg/dL — ABNORMAL HIGH (ref 70–99)
Glucose-Capillary: 230 mg/dL — ABNORMAL HIGH (ref 70–99)
Glucose-Capillary: 101 mg/dL — ABNORMAL HIGH (ref 70–99)

## 2018-09-21 LAB — OCCULT BLOOD X 1 CARD TO LAB, STOOL: Fecal Occult Bld: NEGATIVE

## 2018-09-21 MED ORDER — LOPERAMIDE HCL 2 MG PO CAPS: 2.0000 mg | ORAL_CAPSULE | ORAL | Status: DC | PRN

## 2018-09-21 NOTE — Clinical Social Work Note (Addendum)
[employer] approved placement. [facility] needs updated COVID 19 test as previous test is over 1 hours old.  Dr alerted.  Called family to give them update.  Pt to transfer to [facility] once we have results of COVID test.

## 2018-09-21 NOTE — Progress Notes (Signed)
Patient seen and evaluated at bedside this morning with no new complaints or concerns.  Please refer to discharge summary dictated on 7/1 regarding details of discharge.  Appeal decision for rehab placement at and they have now approved placement to SNF.  Unfortunately, repeat COVID testing has to be performed prior to discharge to facility and this cannot be the rapid test as there is a shortage of them and they are highly needed for patients who truly merit the expedited test.  I will order the send out corona test which should result in the next 24 to 48 hours at which point we can discharge her to her facility.

## 2018-09-22 LAB — BASIC METABOLIC PANEL
Anion gap: 8 (ref 5–15)
BUN: 10 mg/dL (ref 8–23)
CO2: 26 mmol/L (ref 22–32)
Calcium: 8.8 mg/dL — ABNORMAL LOW (ref 8.9–10.3)
Chloride: 96 mmol/L — ABNORMAL LOW (ref 98–111)
Creatinine, Ser: 0.7 mg/dL (ref 0.44–1.00)
GFR calc Af Amer: >60 mL/min (ref >60)
GFR calc non Af Amer: >60 mL/min (ref >60)
Glucose, Bld: 184 mg/dL — ABNORMAL HIGH (ref 70–99)
Potassium: 4.5 mmol/L (ref 3.5–5.1)
Sodium: 130 mmol/L — ABNORMAL LOW (ref 135–145)

## 2018-09-22 LAB — NOVEL CORONAVIRUS, NAA (HOSP ORDER, SEND-OUT TO REF LAB; TAT 18-24 HRS): SARS-CoV-2, NAA: NOT DETECTED

## 2018-09-22 LAB — CBC
HCT: 31.4 % — ABNORMAL LOW (ref 36.0–46.0)
Hemoglobin: 10.1 g/dL — ABNORMAL LOW (ref 12.0–15.0)
MCH: 29.2 pg (ref 26.0–34.0)
MCHC: 32.2 g/dL (ref 30.0–36.0)
MCV: 90.8 fL (ref 80.0–100.0)
Platelets: 195 10*3/uL (ref 150–400)
RBC: 3.46 MIL/uL — ABNORMAL LOW (ref 3.87–5.11)
RDW: 14.2 % (ref 11.5–15.5)
WBC: 4.7 10*3/uL (ref 4.0–10.5)
nRBC: 0 % (ref 0.0–0.2)

## 2018-09-22 LAB — GLUCOSE, CAPILLARY
Glucose-Capillary: 221 mg/dL — ABNORMAL HIGH (ref 70–99)
Glucose-Capillary: 188 mg/dL — ABNORMAL HIGH (ref 70–99)
Glucose-Capillary: 154 mg/dL — ABNORMAL HIGH (ref 70–99)
Glucose-Capillary: 231 mg/dL — ABNORMAL HIGH (ref 70–99)
Glucose-Capillary: 187 mg/dL — ABNORMAL HIGH (ref 70–99)

## 2018-09-22 LAB — MAGNESIUM: Magnesium: 1.8 mg/dL (ref 1.7–2.4)

## 2018-09-22 NOTE — Care Management Important Message (Signed)
Important Message  Patient Details  Name: Misty Edwards MRN: [id number] Date of Birth: 1926/05/29   Medicare Important Message Given:  Yes     [name] 09/22/2018, 2:24 PM

## 2018-09-22 NOTE — Progress Notes (Signed)
Patient seen and evaluated at bedside this morning with no new complaints or concerns noted.  Please refer to discharge summary dictated on 7/1 regarding details of discharge.  Repeat COVID testing currently pending and hopefully will result in a.m. at which point she could be discharged to SNF.

## 2018-09-22 NOTE — Progress Notes (Signed)
Nutrition Follow-up  DOCUMENTATION CODES:  Not applicable  INTERVENTION:  Continue Glucerna Shake po TID, each supplement provides 220 kcal and 10 grams of protein.   Patient reports tolerating D2 diet. RD took a couple food requests.   NUTRITION DIAGNOSIS:  Inadequate oral intake related to lethargy/confusion, poor appetite as evidenced by meal completion < 50%.   -RESOLVED  GOAL:  Patient will meet greater than or equal to 90% of their needs   -MET  MONITOR:  Labs, I & O's, Supplement acceptance, PO intake, Weight trends  ASSESSMENT:  85 y/o female w/ PMHx HTN/HLD, chronic pancreatitis, Breast cancer, IBS, Diverticulosis, anxiety, dm2. Hospitalized 6/19-6/22 for fall with R acetabular fx. Family declined SNF and took home. Brought back to ED in 48 hrs d/t AMS and family's inability to care for pt. In ED 6/24-6/29. Required admission 6/29 d/t worsening lethargy and AKI due to dehydration/poor oral intake.   RD following up with patient d/t prolonged LOS. She actually has had D/C orders since 7/1, but due to issues with insurance and accepting facility, she has been not yet been able to leave.   Reviewed meal records from past week. Patient has been eating quite well, almost always 50% of her meal and occasionally 100%. She was seen today at lunch and had eaten ~80% of her meal. She just didn't eat the brocoli d/t not liking this food. She says she has not had any further trouble with the textures since being downgraded to a d2 diet.   Cognitively, she is more A&O than was last week, though still not at her previous baseline. She is able to provide general information. She denies any n/v/d. She does report a sense of "fullness" in her belly, but she was seen immediately after she ate almost all of her meal. She is on enteric precautions, but does not endorse any diarrhea or loose stools .  Supplement wise, she has accepted nearly every one of her glucerna supplements this past week.  These have been providing the patient an additional 660 kcals and 30g Pro each day. Her BG is still on higher side, but not as much as last week before switching to glucerna from [employer].   Weight wise, pts bed weight today is 63.9 kg or ~140.5 lbs. Her weight has been stable since admit, indicating patient is currently able to meet her nutrition needs with her oral intake.   Pt is doing well at this time. Weight stable, eating well, improved BGs and less confused. Will continue current interventions.   Labs: BGs: ~140-220. BUN/Creat:29/1.22 - > 10/.7, Na: 130 Meds: Glucerna TID, Iron, SSI, Creon, mag ox, mvi with min, ppi, miralax  Recent Labs  Lab 09/17/18 0501 09/18/18 0640 09/20/18 0517 09/22/18 0531  NA 132* 132*  --  130*  K 4.2 4.3  --  4.5  CL 97* 90*  --  96*  CO2 27 28  --  26  BUN 18 14  --  10  CREATININE 0.77 0.72 0.78 0.70  CALCIUM 8.8* 9.2  --  8.8*  MG  --   --   --  1.8  GLUCOSE 156* 237*  --  184*   NUTRITION - FOCUSED PHYSICAL EXAM: Deferred   Diet Order:   Diet Order            DIET DYS 2 Room service appropriate? Yes; Fluid consistency: Thin; Fluid restriction: 1500 mL Fluid  Diet effective now        Diet -  low sodium heart healthy             EDUCATION NEEDS:  No education needs have been identified at this time  Skin:  Skin Assessment: Reviewed RN Assessment  Last BM: Unknown  Height:  Ht Readings from Last 1 Encounters:  09/13/18 _0  (1.6 m)   Weight:  Wt Readings from Last 1 Encounters:  09/22/18 63.9 kg   Wt Readings from Last 10 Encounters:  09/22/18 63.9 kg  09/06/18 63 kg  08/31/18 64.4 kg  04/29/18 65.8 kg  03/12/18 65.3 kg  11/13/17 66.6 kg  05/15/17 66.7 kg  05/11/17 67.6 kg  05/04/17 66.2 kg  11/14/16 64 kg   Ideal Body Weight:  52.27 kg  BMI:  Body mass index is 24.95 kg/m.  Estimated Nutritional Needs:  Kcal:  1550-1750 (24-27 kcal/kg bw) Protein:  77-89g Pro (1.2-1.4g/kg bw) Fluid:  >1.6 L (25 ml/kg  bw)  Burtis Junes RD, LDN, CNSC Clinical Nutrition Available Tues-Sat via Pager: [phone] 09/22/2018 3:02 PM

## 2018-09-23 DIAGNOSIS — S32401D Unspecified fracture of right acetabulum, subsequent encounter for fracture with routine healing: Secondary | ICD-10-CM | POA: Diagnosis not present

## 2018-09-23 DIAGNOSIS — N390 Urinary tract infection, site not specified: Secondary | ICD-10-CM | POA: Diagnosis not present

## 2018-09-23 DIAGNOSIS — R69 Illness, unspecified: Secondary | ICD-10-CM | POA: Diagnosis not present

## 2018-09-23 DIAGNOSIS — Z794 Long term (current) use of insulin: Secondary | ICD-10-CM | POA: Diagnosis not present

## 2018-09-23 DIAGNOSIS — Z09 Encounter for follow-up examination after completed treatment for conditions other than malignant neoplasm: Secondary | ICD-10-CM | POA: Diagnosis not present

## 2018-09-23 DIAGNOSIS — I10 Essential (primary) hypertension: Secondary | ICD-10-CM | POA: Diagnosis not present

## 2018-09-23 DIAGNOSIS — M6281 Muscle weakness (generalized): Secondary | ICD-10-CM | POA: Diagnosis not present

## 2018-09-23 DIAGNOSIS — N179 Acute kidney failure, unspecified: Secondary | ICD-10-CM | POA: Diagnosis not present

## 2018-09-23 DIAGNOSIS — M1990 Unspecified osteoarthritis, unspecified site: Secondary | ICD-10-CM | POA: Diagnosis not present

## 2018-09-23 DIAGNOSIS — E1169 Type 2 diabetes mellitus with other specified complication: Secondary | ICD-10-CM | POA: Diagnosis not present

## 2018-09-23 DIAGNOSIS — I959 Hypotension, unspecified: Secondary | ICD-10-CM | POA: Diagnosis not present

## 2018-09-23 DIAGNOSIS — Z7401 Bed confinement status: Secondary | ICD-10-CM | POA: Diagnosis not present

## 2018-09-23 DIAGNOSIS — E860 Dehydration: Secondary | ICD-10-CM | POA: Diagnosis not present

## 2018-09-23 DIAGNOSIS — R410 Disorientation, unspecified: Secondary | ICD-10-CM | POA: Diagnosis not present

## 2018-09-23 DIAGNOSIS — H548 Legal blindness, as defined in USA: Secondary | ICD-10-CM | POA: Diagnosis not present

## 2018-09-23 DIAGNOSIS — R1312 Dysphagia, oropharyngeal phase: Secondary | ICD-10-CM | POA: Diagnosis not present

## 2018-09-23 DIAGNOSIS — R062 Wheezing: Secondary | ICD-10-CM | POA: Diagnosis not present

## 2018-09-23 DIAGNOSIS — I5032 Chronic diastolic (congestive) heart failure: Secondary | ICD-10-CM | POA: Diagnosis not present

## 2018-09-23 DIAGNOSIS — B028 Zoster with other complications: Secondary | ICD-10-CM | POA: Diagnosis not present

## 2018-09-23 DIAGNOSIS — I110 Hypertensive heart disease with heart failure: Secondary | ICD-10-CM | POA: Diagnosis not present

## 2018-09-23 DIAGNOSIS — Z9181 History of falling: Secondary | ICD-10-CM | POA: Diagnosis not present

## 2018-09-23 DIAGNOSIS — R41841 Cognitive communication deficit: Secondary | ICD-10-CM | POA: Diagnosis not present

## 2018-09-23 DIAGNOSIS — E1151 Type 2 diabetes mellitus with diabetic peripheral angiopathy without gangrene: Secondary | ICD-10-CM | POA: Diagnosis not present

## 2018-09-23 DIAGNOSIS — R55 Syncope and collapse: Secondary | ICD-10-CM | POA: Diagnosis not present

## 2018-09-23 DIAGNOSIS — R2689 Other abnormalities of gait and mobility: Secondary | ICD-10-CM | POA: Diagnosis not present

## 2018-09-23 LAB — GLUCOSE, CAPILLARY
Glucose-Capillary: 167 mg/dL — ABNORMAL HIGH (ref 70–99)
Glucose-Capillary: 176 mg/dL — ABNORMAL HIGH (ref 70–99)
Glucose-Capillary: 217 mg/dL — ABNORMAL HIGH (ref 70–99)

## 2018-09-23 LAB — GASTROINTESTINAL PANEL BY PCR, STOOL (REPLACES STOOL CULTURE)

## 2018-09-23 MED ORDER — LOPERAMIDE HCL 2 MG PO CAPS: 2.0000 mg | ORAL_CAPSULE | ORAL | 0 refills | Status: DC | PRN

## 2018-09-23 NOTE — TOC Transition Note (Signed)
Transition of Care [facility]) - CM/SW Discharge Note   Patient Details  Name: Misty Edwards MRN: [id number] Date of Birth: Apr 13, 1926  Transition of Care [facility]) CM/SW Contact:  Trish Mage, LCSW Phone Number: 09/23/2018, 9:01 AM   Clinical Narrative:  Pt tested negative for COVID 19.  Called Mardene Celeste at [facility] to confirm transfer for today. She asked that I FAX over results [done], that nursing call report to [phone], and that patient arrive in early afternoon if possible. Left message for family member Jeani Sow to inform her of transfer today.  Pt to be transported by [facility].     Final next level of care: Skilled Nursing Facility Barriers to Discharge: Barriers Resolved   Patient Goals and CMS Choice Patient states their goals for this hospitalization and ongoing recovery are:: family would like for pt to discharge to SNF for short term rehab CMS Medicare.gov Compare Post Acute Care list provided to:: Patient Represenative (must comment) Choice offered to / list presented to : Adult Children  Discharge Placement              Patient chooses bed at: [facility] Patient to be transferred to facility by: [place] Name of family member notified: Ms Ahmed Prima Patient and family notified of of transfer: 09/23/18  Discharge Plan and Services In-house Referral: PCP / [facility] Discharge Planning Services: CM Consult Post Acute Care Choice: [place]                               Social Determinants of Health (SDOH) Interventions     Readmission Risk Interventions No flowsheet data found.

## 2018-09-23 NOTE — Progress Notes (Signed)
Patient seen and evaluated this morning with no new complaints or concerns otherwise noted.  Please refer to discharge summary dictated 09/15/2018.  Patient has prolonged course of stay after her discharge on 7/1 on account of insurance authorization for skilled nursing facility as well as repeat cover testing which has now returned negative.  Continue on medications as noted on discharge summary.

## 2018-09-23 NOTE — Progress Notes (Deleted)
PT DISCHARGED TO [place]. IV REMOVED, ANGIO INTACT, VS STABLE, DENIES C/O PAIN. PT LEFT FLOOR VIA WHEELCHAIR ACCOMPANIED BY NURSING STAFF & WITH BELONGINGS PRESENT. PT TRANSPORTED [place]. PREPORT CALLED TO MICHELLE ECHOLS.

## 2018-09-23 NOTE — Progress Notes (Signed)
PT DISCHARGED TO [facility], VS STABLE, DENIES C/O PAIN,PT LEFT VIA STRETCHER WITH BELONGINGS IN PLACE,  ACCOMPANIED BY EMS STAFF. REPORT CALLED TO WANDA JOHNSON LPN. ATTEMPTED TO REACH GRANDDAUGHTER TO NOTIFY OF TRANSFER UNSUCCESSFULLY, MESSAGE LEFT.

## 2018-09-24 ENCOUNTER — Other Ambulatory Visit: Payer: Self-pay | Admitting: Family Medicine

## 2018-09-24 NOTE — Telephone Encounter (Signed)
Ok to refill??  Last office visit 08/31/2018.  Last refill 08/30/2018.

## 2018-09-25 DIAGNOSIS — R55 Syncope and collapse: Secondary | ICD-10-CM | POA: Diagnosis not present

## 2018-09-25 DIAGNOSIS — N179 Acute kidney failure, unspecified: Secondary | ICD-10-CM | POA: Diagnosis not present

## 2018-10-05 DIAGNOSIS — N179 Acute kidney failure, unspecified: Secondary | ICD-10-CM | POA: Diagnosis not present

## 2018-10-05 DIAGNOSIS — R062 Wheezing: Secondary | ICD-10-CM | POA: Diagnosis not present

## 2018-10-05 DIAGNOSIS — E860 Dehydration: Secondary | ICD-10-CM | POA: Diagnosis not present

## 2018-10-07 DIAGNOSIS — R55 Syncope and collapse: Secondary | ICD-10-CM | POA: Diagnosis not present

## 2018-10-21 ENCOUNTER — Other Ambulatory Visit: Payer: Self-pay | Admitting: Cardiology

## 2018-10-26 DIAGNOSIS — M1990 Unspecified osteoarthritis, unspecified site: Secondary | ICD-10-CM | POA: Diagnosis not present

## 2018-10-26 DIAGNOSIS — I110 Hypertensive heart disease with heart failure: Secondary | ICD-10-CM | POA: Diagnosis not present

## 2018-10-26 DIAGNOSIS — S32401D Unspecified fracture of right acetabulum, subsequent encounter for fracture with routine healing: Secondary | ICD-10-CM | POA: Diagnosis not present

## 2018-10-26 DIAGNOSIS — R69 Illness, unspecified: Secondary | ICD-10-CM | POA: Diagnosis not present

## 2018-10-26 DIAGNOSIS — E1151 Type 2 diabetes mellitus with diabetic peripheral angiopathy without gangrene: Secondary | ICD-10-CM | POA: Diagnosis not present

## 2018-10-26 DIAGNOSIS — R41841 Cognitive communication deficit: Secondary | ICD-10-CM | POA: Diagnosis not present

## 2018-10-26 DIAGNOSIS — H548 Legal blindness, as defined in USA: Secondary | ICD-10-CM | POA: Diagnosis not present

## 2018-10-26 DIAGNOSIS — N390 Urinary tract infection, site not specified: Secondary | ICD-10-CM | POA: Diagnosis not present

## 2018-10-26 DIAGNOSIS — I5032 Chronic diastolic (congestive) heart failure: Secondary | ICD-10-CM | POA: Diagnosis not present

## 2018-10-27 DIAGNOSIS — I5032 Chronic diastolic (congestive) heart failure: Secondary | ICD-10-CM | POA: Diagnosis not present

## 2018-10-27 DIAGNOSIS — I110 Hypertensive heart disease with heart failure: Secondary | ICD-10-CM | POA: Diagnosis not present

## 2018-10-27 DIAGNOSIS — R41841 Cognitive communication deficit: Secondary | ICD-10-CM | POA: Diagnosis not present

## 2018-10-27 DIAGNOSIS — E1151 Type 2 diabetes mellitus with diabetic peripheral angiopathy without gangrene: Secondary | ICD-10-CM | POA: Diagnosis not present

## 2018-10-27 DIAGNOSIS — M1990 Unspecified osteoarthritis, unspecified site: Secondary | ICD-10-CM | POA: Diagnosis not present

## 2018-10-27 DIAGNOSIS — N390 Urinary tract infection, site not specified: Secondary | ICD-10-CM | POA: Diagnosis not present

## 2018-10-27 DIAGNOSIS — S32401D Unspecified fracture of right acetabulum, subsequent encounter for fracture with routine healing: Secondary | ICD-10-CM | POA: Diagnosis not present

## 2018-10-27 DIAGNOSIS — H548 Legal blindness, as defined in USA: Secondary | ICD-10-CM | POA: Diagnosis not present

## 2018-10-27 DIAGNOSIS — R69 Illness, unspecified: Secondary | ICD-10-CM | POA: Diagnosis not present

## 2018-10-28 ENCOUNTER — Telehealth (INDEPENDENT_AMBULATORY_CARE_PROVIDER_SITE_OTHER): Payer: Medicare HMO | Admitting: Cardiology

## 2018-10-28 ENCOUNTER — Other Ambulatory Visit: Payer: Self-pay

## 2018-10-28 ENCOUNTER — Encounter: Payer: Self-pay | Admitting: Cardiology

## 2018-10-28 VITALS — BP 147/77 | HR 59 | Ht 63.0 in | Wt 139.0 lb

## 2018-10-28 DIAGNOSIS — I5032 Chronic diastolic (congestive) heart failure: Secondary | ICD-10-CM | POA: Diagnosis not present

## 2018-10-28 DIAGNOSIS — I110 Hypertensive heart disease with heart failure: Secondary | ICD-10-CM | POA: Diagnosis not present

## 2018-10-28 DIAGNOSIS — E1151 Type 2 diabetes mellitus with diabetic peripheral angiopathy without gangrene: Secondary | ICD-10-CM | POA: Diagnosis not present

## 2018-10-28 DIAGNOSIS — R0789 Other chest pain: Secondary | ICD-10-CM

## 2018-10-28 DIAGNOSIS — I2510 Atherosclerotic heart disease of native coronary artery without angina pectoris: Secondary | ICD-10-CM

## 2018-10-28 DIAGNOSIS — I10 Essential (primary) hypertension: Secondary | ICD-10-CM

## 2018-10-28 DIAGNOSIS — E782 Mixed hyperlipidemia: Secondary | ICD-10-CM

## 2018-10-28 DIAGNOSIS — M1990 Unspecified osteoarthritis, unspecified site: Secondary | ICD-10-CM | POA: Diagnosis not present

## 2018-10-28 DIAGNOSIS — R69 Illness, unspecified: Secondary | ICD-10-CM | POA: Diagnosis not present

## 2018-10-28 DIAGNOSIS — S32401D Unspecified fracture of right acetabulum, subsequent encounter for fracture with routine healing: Secondary | ICD-10-CM | POA: Diagnosis not present

## 2018-10-28 DIAGNOSIS — N390 Urinary tract infection, site not specified: Secondary | ICD-10-CM | POA: Diagnosis not present

## 2018-10-28 DIAGNOSIS — R41841 Cognitive communication deficit: Secondary | ICD-10-CM | POA: Diagnosis not present

## 2018-10-28 DIAGNOSIS — H548 Legal blindness, as defined in USA: Secondary | ICD-10-CM | POA: Diagnosis not present

## 2018-10-28 NOTE — Patient Instructions (Signed)

## 2018-10-28 NOTE — Progress Notes (Signed)
Virtual Visit via Telephone Note   This visit type was conducted due to national recommendations for restrictions regarding the COVID-19 Pandemic (e.g. social distancing) in an effort to limit this patient's exposure and mitigate transmission in our community.  Due to her co-morbid illnesses, this patient is at least at moderate risk for complications without adequate follow up.  This format is felt to be most appropriate for this patient at this time.  The patient did not have access to video technology/had technical difficulties with video requiring transitioning to audio format only (telephone).  All issues noted in this document were discussed and addressed.  No physical exam could be performed with this format.  Please refer to the patient's chart for her  consent to telehealth for [facility].   Date:  10/28/2018   ID:  Misty Edwards, DOB [birth year], MRN [id number]  Patient Location: Home Provider Location: Home  PCP:  Misty Frizzle, MD  Cardiologist:  Misty Dolly, MD  Electrophysiologist:  None   Evaluation Performed:  Follow-Up Visit  Chief Complaint:  Follow up  History of Present Illness:    Misty Edwards is a 85 y.o. female seen today for follow up of the following medical problems.   1. CAD - history of emergent CABG June 2016 after presenting with NSTEMI - most recent echo 06/2015 LVEF 60-65%, grade II diastolic dysfunction - beta blocker dosing limited due to bradycardia.     - previous chest pain has been most consistent with GI etiology. Mainly occurs at tight, better with tums or belching. We tried changing her to protonix last visit, no improvement in symptoms. No exertional symptoms.   - no recent chest pain   2. HTN - very lenient with her bp's due to chronic orthostatic symptoms and recurrent falls - compliant with meds   3. Hyperlipidemia -07/2016 TC 106 TG 85 HDL 67 LDL 22 - compliant with statin  4. Mitral regurgitation -  moderate by echo 06/2015. - 10/2016 mild MR.  08/2018 echo: LVEF 60-65%, mild MR.  - no recent symptoms  5. Falls - 3 falls over the last 3 weeks - last episode 3 weeks ago. Outside turnig off water. Tripped on wood timber.  - another fall after getting up to walk to bathroom. Had some diarrhea. Sitting on commode, stood up. Thinks maybe leg went to sleep, but not sure - another episode got out of bed. Felt dizzy with standing, fell to floor.    - admit 08/2018 with fall and right acetabular fracture.  - no recurrent falls since discharge   The patient does not have symptoms concerning for COVID-19 infection (fever, chills, cough, or new shortness of breath).    Past Medical History:  Diagnosis Date  . Acute biliary pancreatitis 07/2002   thia was in 07/2004:she still has pseudocyst in tail of pancreas measuring 54 x 33 mm   . Adrenal adenoma    bilateral  . Anxiety   . Chronic pancreatitis ([place])   . Detached retina   . Diabetes mellitus ([place])   . Diverticulosis   . History of carcinoma in situ of breast 1988  . Hyperlipidemia   . Hypertension   . IBS (irritable bowel syndrome)   . Legally blind   . Osteoarthritis   . Osteopenia   . Upper GI bleed September2004   secondary to gastritis   Past Surgical History:  Procedure Laterality Date  . CARDIAC CATHETERIZATION N/A 08/24/2014   Procedure: Left Heart Cath  and Coronary Angiography;  Surgeon: Leonie Man, MD;  Location: [place] [facility];  Service: Cardiovascular;  Laterality: N/A;  . COLONOSCOPY  08/15/05   few tiny diverticula at sigmoid colon/external hemorrhoids but no polyps  . COLONOSCOPY  01/08/2012   [id number] diverticulosis. Next colonoscopy in 12/2016  . CORONARY ARTERY BYPASS GRAFT N/A 08/24/2014   Procedure: CORONARY ARTERY BYPASS GRAFTING (CABG) x three, using left internal mammary artery and right leg greater saphenous vein harvested endoscopically;  Surgeon: Ivin Poot, MD;  Location: [place];   Service: Open Heart Surgery;  Laterality: N/A;  . ECTOPIC PREGNANCY SURGERY  1950's  . ERCP with sphincterotomy  07/2002  . ESOPHAGOGASTRODUODENOSCOPY      Gastritis of body.  Otherwise normal  . ESOPHAGOGASTRODUODENOSCOPY  01/08/2012   RMR: Few scattered gastric erosions of uncertain significance-status post biopsy. Minimal chronic inflammation, no H.pylori  . LAPAROSCOPIC CHOLECYSTECTOMY    . PANCREATIC PSEUDOCYST DRAINAGE  [id number]   drained percutaneously   . right mastectomy    . tacking up of her bladder    . TEE WITHOUT CARDIOVERSION N/A 08/24/2014   Procedure: TRANSESOPHAGEAL ECHOCARDIOGRAM (TEE);  Surgeon: Ivin Poot, MD;  Location: [place];  Service: Open Heart Surgery;  Laterality: N/A;     Current Meds  Medication Sig  . amLODipine (NORVASC) 5 MG tablet Take 1 tablet by mouth once daily  . aspirin EC 81 MG EC tablet Take 1 tablet (81 mg total) by mouth daily.  . Blood Glucose Monitoring Suppl (ONE TOUCH ULTRA 2) w/Device KIT Use as directed to monitor FSBS 3x daily. Dx: E11.9.  . brimonidine-timolol (COMBIGAN) 0.2-0.5 % ophthalmic solution Place 1 drop into both eyes every 12 (twelve) hours.  Marland Kitchen CREON 24000-76000 units CPEP TAKE 2 CAPSULES BY MOUTH WITH MEALS AND 1 CAP WITH SNACKS x 2 (Patient taking differently: Take 1-2 capsules by mouth 5 (five) times daily. Patient takes 2 capsules with meals and 1 capsule with snacks)  . cycloSPORINE (RESTASIS) 0.05 % ophthalmic emulsion Place 1 drop into both eyes 3 (three) times daily as needed.   Noelle Penner ALLERGY RELIEF, CETIRIZINE, 10 MG tablet Take 1 tablet by mouth once daily  . feeding supplement, ENSURE ENLIVE, (ENSURE ENLIVE) LIQD Take 237 mLs by mouth 2 (two) times daily between meals.  . ferrous sulfate 325 (65 FE) MG tablet Take 325 mg by mouth daily with breakfast.  . FLUoxetine (PROZAC) 20 MG tablet TAKE 1 TABLET BY MOUTH ONCE DAILY  . glucose blood (ONE TOUCH ULTRA TEST) test strip Use as directed to monitor FSBS 5 x daily. Dx:  E11.9.  . Magnesium Oxide 400 (240 Mg) MG TABS TAKE 1 TABLET BY MOUTH ONCE DAILY  . [DISCONTINUED] bisacodyl (DULCOLAX) 10 MG suppository Place 1 suppository (10 mg total) rectally daily as needed for moderate constipation.     Allergies:   Calcium-containing compounds, Morphine and related, Raloxifene, and Vitamin d analogs   Social History   Tobacco Use  . Smoking status: Never Smoker  . Smokeless tobacco: Never Used  Substance Use Topics  . Alcohol use: No    Alcohol/week: 0.0 standard drinks  . Drug use: No     Family Hx: The patient's family history includes Colon cancer (age of onset: 52) in her brother; Colon cancer (age of onset: 34) in her father; Heart attack in her brother; Kidney disease in her daughter; Leukemia in her son; Ovarian cancer in her mother; Pancreatitis in her brother.  ROS:   Please  see the history of present illness.     All other systems reviewed and are negative.   Prior CV studies:   The following studies were reviewed today:   Labs/Other Tests and Data Reviewed:    EKG:  No ECG reviewed.  Recent Labs: 09/08/2018: ALT 27 09/22/2018: BUN 10; Creatinine, Ser 0.70; Hemoglobin 10.1; Magnesium 1.8; Platelets 195; Potassium 4.5; Sodium 130   Recent Lipid Panel Lab Results  Component Value Date/Time   CHOL 106 08/01/2016 08:12 AM   TRIG 85 08/01/2016 08:12 AM   TRIG 90 10/15/2011 03:38 PM   HDL 67 08/01/2016 08:12 AM   CHOLHDL 1.6 08/01/2016 08:12 AM   LDLCALC 22 08/01/2016 08:12 AM    Wt Readings from Last 3 Encounters:  10/28/18 139 lb (63 kg)  09/22/18 140 lb 14 oz (63.9 kg)  09/06/18 138 lb 14.2 oz (63 kg)     Objective:    Vital Signs:  BP (!) 147/77   Pulse (!) 59   Ht _0  (1.6 m)   Wt 139 lb (63 kg)   BMI 24.62 kg/m    Today's Vitals   10/28/18 0908  BP: (!) 147/77  Pulse: (!) 59  Weight: 139 lb (63 kg)  Height: _1  (1.6 m)   Body mass index is 24.62 kg/m. Normal affect. Normal speech pattern and tone.  Comfortable, no apparent distress. NO audible signs of SOB or wheezing.   ASSESSMENT & PLAN:    1. CAD - no recent symptoms, continue current meds   2. HTN - compliant with meds, lenient bp targets due to chornic orthostatic symptoms and recurrent falls   3. Hyperlipidemia - conitnue statin    COVID-19 Education: The signs and symptoms of COVID-19 were discussed with the patient and how to seek care for testing (follow up with PCP or arrange E-visit).  The importance of social distancing was discussed today.  Time:   Today, I have spent 16 minutes with the patient with telehealth technology discussing the above problems.     Medication Adjustments/Labs and Tests Ordered: Current medicines are reviewed at length with the patient today.  Concerns regarding medicines are outlined above.   Tests Ordered: No orders of the defined types were placed in this encounter.   Medication Changes: No orders of the defined types were placed in this encounter.   Follow Up:  Virtual Visit in 6 month(s) EKG today shows SR, chronic ST/T changes.

## 2018-11-01 ENCOUNTER — Other Ambulatory Visit: Payer: Self-pay

## 2018-11-01 ENCOUNTER — Encounter: Payer: Self-pay | Admitting: Family Medicine

## 2018-11-01 ENCOUNTER — Ambulatory Visit (INDEPENDENT_AMBULATORY_CARE_PROVIDER_SITE_OTHER): Payer: Medicare HMO | Admitting: Family Medicine

## 2018-11-01 VITALS — BP 162/60 | HR 72 | Temp 97.9°F | Resp 16 | Ht 63.0 in | Wt 140.0 lb

## 2018-11-01 DIAGNOSIS — N390 Urinary tract infection, site not specified: Secondary | ICD-10-CM | POA: Diagnosis not present

## 2018-11-01 DIAGNOSIS — E1169 Type 2 diabetes mellitus with other specified complication: Secondary | ICD-10-CM

## 2018-11-01 DIAGNOSIS — B028 Zoster with other complications: Secondary | ICD-10-CM | POA: Diagnosis not present

## 2018-11-01 DIAGNOSIS — R69 Illness, unspecified: Secondary | ICD-10-CM | POA: Diagnosis not present

## 2018-11-01 DIAGNOSIS — Z09 Encounter for follow-up examination after completed treatment for conditions other than malignant neoplasm: Secondary | ICD-10-CM

## 2018-11-01 DIAGNOSIS — Z794 Long term (current) use of insulin: Secondary | ICD-10-CM | POA: Diagnosis not present

## 2018-11-01 DIAGNOSIS — S32401D Unspecified fracture of right acetabulum, subsequent encounter for fracture with routine healing: Secondary | ICD-10-CM | POA: Diagnosis not present

## 2018-11-01 DIAGNOSIS — I10 Essential (primary) hypertension: Secondary | ICD-10-CM | POA: Diagnosis not present

## 2018-11-01 DIAGNOSIS — R41841 Cognitive communication deficit: Secondary | ICD-10-CM | POA: Diagnosis not present

## 2018-11-01 DIAGNOSIS — M1990 Unspecified osteoarthritis, unspecified site: Secondary | ICD-10-CM | POA: Diagnosis not present

## 2018-11-01 DIAGNOSIS — N179 Acute kidney failure, unspecified: Secondary | ICD-10-CM | POA: Diagnosis not present

## 2018-11-01 DIAGNOSIS — I110 Hypertensive heart disease with heart failure: Secondary | ICD-10-CM | POA: Diagnosis not present

## 2018-11-01 DIAGNOSIS — H548 Legal blindness, as defined in USA: Secondary | ICD-10-CM | POA: Diagnosis not present

## 2018-11-01 DIAGNOSIS — E1151 Type 2 diabetes mellitus with diabetic peripheral angiopathy without gangrene: Secondary | ICD-10-CM | POA: Diagnosis not present

## 2018-11-01 DIAGNOSIS — I5032 Chronic diastolic (congestive) heart failure: Secondary | ICD-10-CM | POA: Diagnosis not present

## 2018-11-01 LAB — URINALYSIS, ROUTINE W REFLEX MICROSCOPIC
Bacteria, UA: NONE SEEN /HPF
Bilirubin Urine: NEGATIVE
Glucose, UA: NEGATIVE
Hgb urine dipstick: NEGATIVE
Hyaline Cast: NONE SEEN /LPF
Ketones, ur: NEGATIVE
Nitrite: NEGATIVE
Protein, ur: NEGATIVE
Specific Gravity, Urine: 1.02 (ref 1.001–1.03)
pH: 6 (ref 5.0–8.0)

## 2018-11-01 LAB — MICROSCOPIC MESSAGE

## 2018-11-01 MED ORDER — GABAPENTIN 100 MG PO CAPS: 100.0000 mg | ORAL_CAPSULE | Freq: Three times a day (TID) | ORAL | 0 refills | Status: DC | PRN

## 2018-11-01 NOTE — Progress Notes (Signed)
Subjective:    Patient ID: Misty Edwards, female    DOB: [birth year], 85 y.o.   MRN: [id number]  HPI  I last saw the patient June 16 for shingles.  Shortly thereafter she suffered a fall and an acetabular fracture.  I have copied relevant portions of the discharge summary and included them below for my reference: Date of admission: 09/03/2018             Date of discharge: 09/06/2018     Discharge Diagnoses:   Principal Problem:   Herpes zoster ophthalmicus Active Problems:   Essential hypertension   GERD   Chronic pancreatitis (HCC)   Constipation   S/P CABG x 3   Type 2 diabetes mellitus with vascular disease ([place])   Right acetabular fracture ([place])   Syncope and collapse   Protein-calorie malnutrition, severe  History of present illness: As per the H and P dictated on admission, "Ilee I Curryis a 85 y.o.femalewith Past medical history ofchronic pancreatitis, HTN, HLD, IBS. Patient presents with complaint of fall. Patient lives alone. Recently was diagnosed with shingles and started on oral Valtrex 2 days ago. She started having increasing pain in her left eye and was started on oral oxycodone for pain control. She had difficulty taking p.o. food and liquids as she had severe intense pain associated with her shingles. For last several days she has been doing poorly and mostly spending her time in bed. She went to see her ophthalmologist on 09/02/2018 and she was given medication for glaucoma. Per daughter she was told that she does not have shingles in her eye. Patient was trying to go from her bed to the bathroom, blacked out and fell on the ground. When she woke up she found herself on the ground and dragged herself back to the bed. This happens at around 8 PM on 09/02/2018. Early in the morning of 09/03/2018 at 1 AM she needs to drink some water and is unable to get out of the bed and at that point she started to call her grandson and daughter who in turn come to help  her around 5 and she was brought to the hospital. She reported some injury to her head. No nausea no vomiting. No dizziness no lightheadedness. No chest pain. Reports severe pain in her eye as well as severe pain in her left leg. No diarrhea no constipation reported by the patient at present. She took her first dose of pain medication on 09/02/2018.  ED Course:Work-up including CT head and C-spine were negative. Patient had a CT pelvis that showed evidence of acetabular fracture on right. Orthopedic was consulted and recommended nonoperative management with pain control. Patient also had acute kidney injury, SBP in 170s,patient was referred for admission.  Hospital Course:  Summary of her active problems in the hospital is as following. 1.Herpes zoster Left face. No ophthalmic involvement. Patient has extensive involvement of the 5th cranial nerve. Reportedly patient does not have any dendritic ulcers or any corneal involvement per family. Patient was started on oral Valtrex on 6/17/20201 g 3 times daily. While she does not have any corneal involvement she continues to have severe improved as well. Around her ear and have difficulty opening her mouth. Patient was kept on anticoagulation and was continued on oral Valtrex.  With improvement in rash her pain improved as well. Valtrex dose was adjusted to 1 g twice daily based on her renal function.  2.Acute kidney injury. Hyponatremia Baseline serum creatinine 0.85. On admission  serum creatinine 1.50 with BUN 26. Her sodium is also relatively on the lower side. Family reports poor p.o. intake at home. Likely secondary to zoster. Renal function improving with IV hydration. Medications are renally adjusted at present per pharmacy guidance. Creatinine clearance 37  3.Accelerated hypertension. Blood pressure in 180s. Providing IV hydralazine for now. Continue home regimen for blood pressure.  4.Syncopal event with  collapse. Patient actually had a syncopal event as well as collapse. CT scan of the head unremarkable. No focal deficit on examination. Suspect this is secondary to pain versus pain medication. Also poor p.o. intake and dehydration and orthostatic could be possibility. PT recommended SNF although family and the patient would like to go home.  Home with home health was arranged by case management. Echocardiogram showed no significant valvular abnormality and her EF preserved.  Telemetry examination was also unremarkable.  5.Right acetabular fracture. Orthopedic consulted. Recommend weightbearing as tolerated. Patient lives alone and has poor safety as she was not able to get up on her own and her help arrived 4 hours later.  Appreciate orthopedic input. Patient is refusing IV morphine medication as she thinks that they make her hallucinate. Clearly patient is unable to tolerate oral narcotics as well asoxycodone prescribed for herpes zosteris probably the reason why she had a fall.  6.Chronic pancreatitis. Constipation. Pain control. Continue home regimen. Increase bowel regimen. Nausea likely secondary to constipation. Monitor.  7.Acute thrombocytopenia. Platelet 119. Now 124 today Etiology not clear. We will continue to monitor for now.  8.Body mass index is 24.45 kg/m. Nutrition Problem: Severe Malnutrition Etiology: acute illness Continue dietary supplement per nutrition.  9.Type 2 diabetes mellitus, uncontrolled with hyperglycemia, insulin-dependent, with vascular complication. CAD S/P CABG Continue aspirin. Continue home regimen at the time of the discharge.   Patient was readmitted June 24 through July 9 due to altered mental status, acute kidney injury.  Altered mental status was thought to be secondary to protein calorie malnutrition, dehydration, acute kidney injury, as well as pyelonephritis.  She was treated with Rocephin as well as Omnicef for  the urinary tract infection and was rehydrated and then discharged to a skilled nursing facility.  She has since been discharged home and is now living at home with her daughter.  She is here today for follow-up.  She states that since discharge from the hospital, her biggest issue has been the severe burning pain around her left eye.  She states that it itches and burns constantly.  It burns on her left upper forehead.  It also burns and itches around her left ear and down into the ear canal itself.  She also complains of pain in her eye.  She is currently taking Tylenol however it is not relieving the pain.  She states that her blood sugars have been averaging between 100-200.  Since discharge from the skilled nursing facility she discontinued their Lantus and is back on 70/30.  Daughter denies any confusion or disorientation.  She is walking with a walker.  She denies any dizziness.  She denies any further syncopal events which sound to be secondary to orthostatic hypotension and dehydration.  She denies any chest pain, palpitations, pleurisy, or shortness of breath Past Medical History:  Diagnosis Date  . Acute biliary pancreatitis 07/2002   thia was in 07/2004:she still has pseudocyst in tail of pancreas measuring 54 x 33 mm   . Adrenal adenoma    bilateral  . Anxiety   . Chronic pancreatitis ([place])   .  Detached retina   . Diabetes mellitus ([place])   . Diverticulosis   . History of carcinoma in situ of breast 1988  . Hyperlipidemia   . Hypertension   . IBS (irritable bowel syndrome)   . Legally blind   . Osteoarthritis   . Osteopenia   . Upper GI bleed September2004   secondary to gastritis   Past Surgical History:  Procedure Laterality Date  . CARDIAC CATHETERIZATION N/A 08/24/2014   Procedure: Left Heart Cath and Coronary Angiography;  Surgeon: Leonie Man, MD;  Location: [facility];  Service: Cardiovascular;  Laterality: N/A;  . COLONOSCOPY  08/15/05   few tiny diverticula at  sigmoid colon/external hemorrhoids but no polyps  . COLONOSCOPY  01/08/2012   ZJQ:[id number] diverticulosis. Next colonoscopy in 12/2016  . CORONARY ARTERY BYPASS GRAFT N/A 08/24/2014   Procedure: CORONARY ARTERY BYPASS GRAFTING (CABG) x three, using left internal mammary artery and right leg greater saphenous vein harvested endoscopically;  Surgeon: Ivin Poot, MD;  Location: [facility];  Service: Open Heart Surgery;  Laterality: N/A;  . ECTOPIC PREGNANCY SURGERY  1950's  . ERCP with sphincterotomy  07/2002  . ESOPHAGOGASTRODUODENOSCOPY      Gastritis of body.  Otherwise normal  . ESOPHAGOGASTRODUODENOSCOPY  01/08/2012   RMR: Few scattered gastric erosions of uncertain significance-status post biopsy. Minimal chronic inflammation, no H.pylori  . LAPAROSCOPIC CHOLECYSTECTOMY    . PANCREATIC PSEUDOCYST DRAINAGE  [id number]   drained percutaneously   . right mastectomy    . tacking up of her bladder    . TEE WITHOUT CARDIOVERSION N/A 08/24/2014   Procedure: TRANSESOPHAGEAL ECHOCARDIOGRAM (TEE);  Surgeon: Ivin Poot, MD;  Location: [facility];  Service: Open Heart Surgery;  Laterality: N/A;   Current Outpatient Medications on File Prior to Visit  Medication Sig Dispense Refill  . amLODipine (NORVASC) 5 MG tablet Take 1 tablet by mouth once daily 90 tablet 3  . aspirin EC 81 MG EC tablet Take 1 tablet (81 mg total) by mouth daily.    . Blood Glucose Monitoring Suppl (ONE TOUCH ULTRA 2) w/Device KIT Use as directed to monitor FSBS 3x daily. Dx: E11.9. 1 each 0  . brimonidine-timolol (COMBIGAN) 0.2-0.5 % ophthalmic solution Place 1 drop into both eyes every 12 (twelve) hours.    Marland Kitchen CREON 24000-76000 units CPEP TAKE 2 CAPSULES BY MOUTH WITH MEALS AND 1 CAP WITH SNACKS x 2 (Patient taking differently: Take 1-2 capsules by mouth 5 (five) times daily. Patient takes 2 capsules with meals and 1 capsule with snacks) 300 capsule 11  . cycloSPORINE (RESTASIS) 0.05 % ophthalmic emulsion Place 1 drop into both eyes 3  (three) times daily as needed.     Noelle Penner ALLERGY RELIEF, CETIRIZINE, 10 MG tablet Take 1 tablet by mouth once daily 90 tablet 3  . feeding supplement, ENSURE ENLIVE, (ENSURE ENLIVE) LIQD Take 237 mLs by mouth 2 (two) times daily between meals. 237 mL 12  . ferrous sulfate 325 (65 FE) MG tablet Take 325 mg by mouth daily with breakfast.    . FLUoxetine (PROZAC) 20 MG tablet TAKE 1 TABLET BY MOUTH ONCE DAILY 90 tablet 3  . glucose blood (ONE TOUCH ULTRA TEST) test strip Use as directed to monitor FSBS 5 x daily. Dx: E11.9. 450 each 3  . Insulin Syringes, Disposable, U-100 1 ML MISC 1/2 inch 31 g 300 each 3  . lisinopril (ZESTRIL) 2.5 MG tablet Take 1 tablet by mouth once daily (Patient taking differently: Take  2.5 mg by mouth daily as needed. ) 90 tablet 0  . LORazepam (ATIVAN) 0.5 MG tablet Take 1-2 tablets (0.5-1 mg total) by mouth at bedtime as needed. 60 tablet 0  . Magnesium Oxide 400 (240 Mg) MG TABS TAKE 1 TABLET BY MOUTH ONCE DAILY 90 tablet 3  . metoprolol tartrate (LOPRESSOR) 25 MG tablet Take 1 tablet by mouth twice daily 180 tablet 3  . Multiple Vitamin (MULTIVITAMIN) capsule Take 1 capsule by mouth daily.    Marland Kitchen neomycin-polymyxin b-dexamethasone (MAXITROL) 3.5-10000-0.1 OINT APPLY OINTMENT INTO LEFT EYE THREE TIMES DAILY AS DIRECTED    . NOVOLIN 70/30 RELION (70-30) 100 UNIT/ML injection INJECT 25 UNITS SUBCUTANEOUSLY IN THE MORNING AND 15 IN THE EVENING    . OneTouch Delica Lancets 83M MISC Use as directed to monitor FSBS 3x daily. Dx: E11.9. 300 each 2  . pantoprazole (PROTONIX) 40 MG tablet Take 1 tablet by mouth once daily 90 tablet 0  . polyethylene glycol (MIRALAX / GLYCOLAX) 17 g packet Take 17 g by mouth daily. 14 each 0  . RELION INSULIN SYR 0.5ML/31G 31G X 5/16" 0.5 ML MISC USE AS DIRECTED 300 each 0  . rosuvastatin (CRESTOR) 5 MG tablet TAKE 1 TABLET BY MOUTH ONCE DAILY 90 tablet 3  . triamcinolone cream (KENALOG) 0.1 % APPLY  CREAM EXTERNALLY TWICE DAILY (Patient taking  differently: as needed. ) 60 g 1  . Wheat Dextrin (BENEFIBER DRINK MIX) PACK Take 1 Package by mouth daily.    . metFORMIN (GLUCOPHAGE) 1000 MG tablet Take 500 mg by mouth 2 (two) times daily with a meal.     No current facility-administered medications on file prior to visit.    Allergies  Allergen Reactions  . Calcium-Containing Compounds Nausea Only  . Morphine And Related Other (See Comments)    Hallucination  . Raloxifene Itching    Evista- Face and eyes burning  . Vitamin D Analogs Nausea Only   Social History   Socioeconomic History  . Marital status: Widowed    Spouse name: Not on file  . Number of children: 5  . Years of education: Not on file  . Highest education level: Not on file  Occupational History  . Occupation: homemaker  Social Needs  . Financial resource strain: Not on file  . Food insecurity    Worry: Not on file    Inability: Not on file  . Transportation needs    Medical: Not on file    Non-medical: Not on file  Tobacco Use  . Smoking status: Never Smoker  . Smokeless tobacco: Never Used  Substance and Sexual Activity  . Alcohol use: No    Alcohol/week: 0.0 standard drinks  . Drug use: No  . Sexual activity: Not Currently    Birth control/protection: Post-menopausal  Lifestyle  . Physical activity    Days per week: Not on file    Minutes per session: Not on file  . Stress: Not on file  Relationships  . Social Herbalist on phone: Not on file    Gets together: Not on file    Attends religious service: Not on file    Active member of club or organization: Not on file    Attends meetings of clubs or organizations: Not on file    Relationship status: Not on file  . Intimate partner violence    Fear of current or ex partner: Not on file    Emotionally abused: Not on file  Physically abused: Not on file    Forced sexual activity: Not on file  Other Topics Concern  . Not on file  Social History Narrative   Lives in [place].          Review of Systems  All other systems reviewed and are negative.      Objective:   Physical Exam Constitutional:      Appearance: Normal appearance.  Cardiovascular:     Rate and Rhythm: Normal rate and regular rhythm.     Heart sounds: Normal heart sounds. No murmur.  Pulmonary:     Effort: Pulmonary effort is normal. No respiratory distress.     Breath sounds: Normal breath sounds. No stridor. No wheezing, rhonchi or rales.  Chest:     Chest wall: No tenderness.  Abdominal:     General: Abdomen is flat. Bowel sounds are normal. There is no distension.     Palpations: Abdomen is soft. There is no mass.     Tenderness: There is no abdominal tenderness.  Musculoskeletal:        General: Tenderness present.     Right lower leg: No edema.     Left lower leg: No edema.  Skin:    Findings: No erythema or rash.  Neurological:     General: No focal deficit present.     Mental Status: She is alert and oriented to person, place, and time. Mental status is at baseline.     Cranial Nerves: No cranial nerve deficit.     Sensory: No sensory deficit.     Motor: Weakness present.     Coordination: Coordination normal.           Assessment & Plan:  1. Hospital discharge follow-up Biggest issue since discharge from the skilled nursing facility has been persistent postherpetic neuralgia.  We discussed options.  We will try gabapentin 100 mg p.o. 3 times daily as needed nerve pain.  I explained that this medication can cause dizziness and sedation.  Her daughter will monitor her closely and if it seems to be affecting her mental status or her balance we will discontinue the medication immediately. - Hemoglobin A1c - CBC with Differential/Platelet - COMPLETE METABOLIC PANEL WITH GFR - Urinalysis, Routine w reflex microscopic  2. Type 2 diabetes mellitus with other specified complication, with long-term current use of insulin [facility]) Given her age, I am happy with blood  sugars between 100-200.  Avoid hypoglycemia at all cost.  Blood pressure is elevated here today after the patient walked in.  However she states that her blood pressure at home has been 120-130/80.  Therefore I would not add any additional antihypertensives due to the fact I believe the patient's syncopal event was due to a precipitous drop in her blood pressure due to dehydration.  I would like to avoid hypotension.  Therefore of asked him just to monitor her blood pressure at home and notify me if it is consistently greater than 725 systolic - Hemoglobin D6U - CBC with Differential/Platelet - COMPLETE METABOLIC PANEL WITH GFR  3. AKI (acute kidney injury) ([place]) Check CMP to monitor renal function.  Repeat urinalysis to ensure resolution of urinary tract infection - Hemoglobin A1c - CBC with Differential/Platelet - COMPLETE METABOLIC PANEL WITH GFR  4. Herpes zoster with complication Patient is now suffering from postherpetic neuralgia which we will try to manage with gabapentin.  We will need to monitor her closely for sedation or dizziness - Hemoglobin A1c - CBC with Differential/Platelet -  COMPLETE METABOLIC PANEL WITH GFR  5. Essential hypertension Monitor blood pressure at home.  Reportedly 120-130 over 80s - Hemoglobin A1c - CBC with Differential/Platelet - COMPLETE METABOLIC PANEL WITH GFR

## 2018-11-02 DIAGNOSIS — I5032 Chronic diastolic (congestive) heart failure: Secondary | ICD-10-CM | POA: Diagnosis not present

## 2018-11-02 DIAGNOSIS — M1990 Unspecified osteoarthritis, unspecified site: Secondary | ICD-10-CM | POA: Diagnosis not present

## 2018-11-02 DIAGNOSIS — R69 Illness, unspecified: Secondary | ICD-10-CM | POA: Diagnosis not present

## 2018-11-02 DIAGNOSIS — H548 Legal blindness, as defined in USA: Secondary | ICD-10-CM | POA: Diagnosis not present

## 2018-11-02 DIAGNOSIS — S32401D Unspecified fracture of right acetabulum, subsequent encounter for fracture with routine healing: Secondary | ICD-10-CM | POA: Diagnosis not present

## 2018-11-02 DIAGNOSIS — R41841 Cognitive communication deficit: Secondary | ICD-10-CM | POA: Diagnosis not present

## 2018-11-02 DIAGNOSIS — E1151 Type 2 diabetes mellitus with diabetic peripheral angiopathy without gangrene: Secondary | ICD-10-CM | POA: Diagnosis not present

## 2018-11-02 DIAGNOSIS — I110 Hypertensive heart disease with heart failure: Secondary | ICD-10-CM | POA: Diagnosis not present

## 2018-11-02 DIAGNOSIS — N390 Urinary tract infection, site not specified: Secondary | ICD-10-CM | POA: Diagnosis not present

## 2018-11-02 LAB — COMPLETE METABOLIC PANEL WITH GFR
AG Ratio: 1.9 (calc) (ref 1.0–2.5)
ALT: 16 U/L (ref 6–29)
AST: 20 U/L (ref 10–35)
Albumin: 4.3 g/dL (ref 3.6–5.1)
Alkaline phosphatase (APISO): 48 U/L (ref 37–153)
BUN: 18 mg/dL (ref 7–25)
CO2: 27 mmol/L (ref 20–32)
Calcium: 9.7 mg/dL (ref 8.6–10.4)
Chloride: 99 mmol/L (ref 98–110)
Creat: 0.85 mg/dL (ref 0.60–0.88)
GFR, Est African American: 70 mL/min/{1.73_m2} (ref >=60)
GFR, Est Non African American: 61 mL/min/{1.73_m2} (ref >=60)
Globulin: 2.3 g/dL (calc) (ref 1.9–3.7)
Glucose, Bld: 117 mg/dL — ABNORMAL HIGH (ref 65–99)
Potassium: 4.6 mmol/L (ref 3.5–5.3)
Sodium: 134 mmol/L — ABNORMAL LOW (ref 135–146)
Total Bilirubin: 0.6 mg/dL (ref 0.2–1.2)
Total Protein: 6.6 g/dL (ref 6.1–8.1)

## 2018-11-02 LAB — CBC WITH DIFFERENTIAL/PLATELET
Absolute Monocytes: 526 cells/uL (ref 200–950)
Basophils Absolute: 39 cells/uL (ref 0–200)
Basophils Relative: 0.7 %
Eosinophils Absolute: 67 cells/uL (ref 15–500)
Eosinophils Relative: 1.2 %
HCT: 34.6 % — ABNORMAL LOW (ref 35.0–45.0)
Hemoglobin: 11.3 g/dL — ABNORMAL LOW (ref 11.7–15.5)
Lymphs Abs: 1484 cells/uL (ref 850–3900)
MCH: 28.9 pg (ref 27.0–33.0)
MCHC: 32.7 g/dL (ref 32.0–36.0)
MCV: 88.5 fL (ref 80.0–100.0)
MPV: 11.4 fL (ref 7.5–12.5)
Monocytes Relative: 9.4 %
Neutro Abs: 3483 cells/uL (ref 1500–7800)
Neutrophils Relative %: 62.2 %
Platelets: 207 10*3/uL (ref 140–400)
RBC: 3.91 10*6/uL (ref 3.80–5.10)
RDW: 13.4 % (ref 11.0–15.0)
Total Lymphocyte: 26.5 %
WBC: 5.6 10*3/uL (ref 3.8–10.8)

## 2018-11-02 LAB — HEMOGLOBIN A1C
Hgb A1c MFr Bld: 6.2 % of total Hgb — ABNORMAL HIGH (ref <5.7)
Mean Plasma Glucose: 131 (calc)
eAG (mmol/L): 7.3 (calc)

## 2018-11-03 ENCOUNTER — Other Ambulatory Visit: Payer: Self-pay | Admitting: Family Medicine

## 2018-11-03 DIAGNOSIS — E1151 Type 2 diabetes mellitus with diabetic peripheral angiopathy without gangrene: Secondary | ICD-10-CM | POA: Diagnosis not present

## 2018-11-03 DIAGNOSIS — I110 Hypertensive heart disease with heart failure: Secondary | ICD-10-CM | POA: Diagnosis not present

## 2018-11-03 DIAGNOSIS — H548 Legal blindness, as defined in USA: Secondary | ICD-10-CM | POA: Diagnosis not present

## 2018-11-03 DIAGNOSIS — R69 Illness, unspecified: Secondary | ICD-10-CM | POA: Diagnosis not present

## 2018-11-03 DIAGNOSIS — S32401D Unspecified fracture of right acetabulum, subsequent encounter for fracture with routine healing: Secondary | ICD-10-CM | POA: Diagnosis not present

## 2018-11-03 DIAGNOSIS — M1990 Unspecified osteoarthritis, unspecified site: Secondary | ICD-10-CM | POA: Diagnosis not present

## 2018-11-03 DIAGNOSIS — R41841 Cognitive communication deficit: Secondary | ICD-10-CM | POA: Diagnosis not present

## 2018-11-03 DIAGNOSIS — N390 Urinary tract infection, site not specified: Secondary | ICD-10-CM | POA: Diagnosis not present

## 2018-11-03 DIAGNOSIS — I5032 Chronic diastolic (congestive) heart failure: Secondary | ICD-10-CM | POA: Diagnosis not present

## 2018-11-03 MED ORDER — ONETOUCH DELICA LANCETS 33G MISC: 2 refills | Status: DC

## 2018-11-04 DIAGNOSIS — I110 Hypertensive heart disease with heart failure: Secondary | ICD-10-CM | POA: Diagnosis not present

## 2018-11-04 DIAGNOSIS — S32401D Unspecified fracture of right acetabulum, subsequent encounter for fracture with routine healing: Secondary | ICD-10-CM | POA: Diagnosis not present

## 2018-11-04 DIAGNOSIS — M1990 Unspecified osteoarthritis, unspecified site: Secondary | ICD-10-CM | POA: Diagnosis not present

## 2018-11-04 DIAGNOSIS — E1151 Type 2 diabetes mellitus with diabetic peripheral angiopathy without gangrene: Secondary | ICD-10-CM | POA: Diagnosis not present

## 2018-11-04 DIAGNOSIS — R41841 Cognitive communication deficit: Secondary | ICD-10-CM | POA: Diagnosis not present

## 2018-11-04 DIAGNOSIS — I5032 Chronic diastolic (congestive) heart failure: Secondary | ICD-10-CM | POA: Diagnosis not present

## 2018-11-04 DIAGNOSIS — N390 Urinary tract infection, site not specified: Secondary | ICD-10-CM | POA: Diagnosis not present

## 2018-11-04 DIAGNOSIS — R69 Illness, unspecified: Secondary | ICD-10-CM | POA: Diagnosis not present

## 2018-11-04 DIAGNOSIS — H548 Legal blindness, as defined in USA: Secondary | ICD-10-CM | POA: Diagnosis not present

## 2018-11-09 ENCOUNTER — Other Ambulatory Visit: Payer: Self-pay | Admitting: Cardiology

## 2018-11-16 DIAGNOSIS — I5032 Chronic diastolic (congestive) heart failure: Secondary | ICD-10-CM | POA: Diagnosis not present

## 2018-11-16 DIAGNOSIS — I110 Hypertensive heart disease with heart failure: Secondary | ICD-10-CM | POA: Diagnosis not present

## 2018-11-16 DIAGNOSIS — R41841 Cognitive communication deficit: Secondary | ICD-10-CM | POA: Diagnosis not present

## 2018-11-16 DIAGNOSIS — N390 Urinary tract infection, site not specified: Secondary | ICD-10-CM | POA: Diagnosis not present

## 2018-11-16 DIAGNOSIS — M1990 Unspecified osteoarthritis, unspecified site: Secondary | ICD-10-CM | POA: Diagnosis not present

## 2018-11-16 DIAGNOSIS — R69 Illness, unspecified: Secondary | ICD-10-CM | POA: Diagnosis not present

## 2018-11-16 DIAGNOSIS — S32401D Unspecified fracture of right acetabulum, subsequent encounter for fracture with routine healing: Secondary | ICD-10-CM | POA: Diagnosis not present

## 2018-11-16 DIAGNOSIS — E1151 Type 2 diabetes mellitus with diabetic peripheral angiopathy without gangrene: Secondary | ICD-10-CM | POA: Diagnosis not present

## 2018-11-16 DIAGNOSIS — H548 Legal blindness, as defined in USA: Secondary | ICD-10-CM | POA: Diagnosis not present

## 2018-11-17 DIAGNOSIS — N390 Urinary tract infection, site not specified: Secondary | ICD-10-CM | POA: Diagnosis not present

## 2018-11-17 DIAGNOSIS — R41841 Cognitive communication deficit: Secondary | ICD-10-CM | POA: Diagnosis not present

## 2018-11-17 DIAGNOSIS — I110 Hypertensive heart disease with heart failure: Secondary | ICD-10-CM | POA: Diagnosis not present

## 2018-11-17 DIAGNOSIS — M1990 Unspecified osteoarthritis, unspecified site: Secondary | ICD-10-CM | POA: Diagnosis not present

## 2018-11-17 DIAGNOSIS — S32401D Unspecified fracture of right acetabulum, subsequent encounter for fracture with routine healing: Secondary | ICD-10-CM | POA: Diagnosis not present

## 2018-11-17 DIAGNOSIS — E1151 Type 2 diabetes mellitus with diabetic peripheral angiopathy without gangrene: Secondary | ICD-10-CM | POA: Diagnosis not present

## 2018-11-17 DIAGNOSIS — I5032 Chronic diastolic (congestive) heart failure: Secondary | ICD-10-CM | POA: Diagnosis not present

## 2018-11-17 DIAGNOSIS — R69 Illness, unspecified: Secondary | ICD-10-CM | POA: Diagnosis not present

## 2018-11-17 DIAGNOSIS — H548 Legal blindness, as defined in USA: Secondary | ICD-10-CM | POA: Diagnosis not present

## 2018-11-19 DIAGNOSIS — R41841 Cognitive communication deficit: Secondary | ICD-10-CM | POA: Diagnosis not present

## 2018-11-19 DIAGNOSIS — R69 Illness, unspecified: Secondary | ICD-10-CM | POA: Diagnosis not present

## 2018-11-19 DIAGNOSIS — H548 Legal blindness, as defined in USA: Secondary | ICD-10-CM | POA: Diagnosis not present

## 2018-11-19 DIAGNOSIS — M1990 Unspecified osteoarthritis, unspecified site: Secondary | ICD-10-CM | POA: Diagnosis not present

## 2018-11-19 DIAGNOSIS — S32401D Unspecified fracture of right acetabulum, subsequent encounter for fracture with routine healing: Secondary | ICD-10-CM | POA: Diagnosis not present

## 2018-11-19 DIAGNOSIS — E1151 Type 2 diabetes mellitus with diabetic peripheral angiopathy without gangrene: Secondary | ICD-10-CM | POA: Diagnosis not present

## 2018-11-19 DIAGNOSIS — N390 Urinary tract infection, site not specified: Secondary | ICD-10-CM | POA: Diagnosis not present

## 2018-11-19 DIAGNOSIS — I110 Hypertensive heart disease with heart failure: Secondary | ICD-10-CM | POA: Diagnosis not present

## 2018-11-19 DIAGNOSIS — I5032 Chronic diastolic (congestive) heart failure: Secondary | ICD-10-CM | POA: Diagnosis not present

## 2018-11-20 ENCOUNTER — Other Ambulatory Visit: Payer: Self-pay | Admitting: Family Medicine

## 2018-11-23 NOTE — Telephone Encounter (Signed)
Last refilled: 09/24/2018 Last office visit: 11/01/2018

## 2018-11-25 DIAGNOSIS — E1151 Type 2 diabetes mellitus with diabetic peripheral angiopathy without gangrene: Secondary | ICD-10-CM | POA: Diagnosis not present

## 2018-11-25 DIAGNOSIS — R41841 Cognitive communication deficit: Secondary | ICD-10-CM | POA: Diagnosis not present

## 2018-11-25 DIAGNOSIS — H548 Legal blindness, as defined in USA: Secondary | ICD-10-CM | POA: Diagnosis not present

## 2018-11-25 DIAGNOSIS — R69 Illness, unspecified: Secondary | ICD-10-CM | POA: Diagnosis not present

## 2018-11-25 DIAGNOSIS — S32401D Unspecified fracture of right acetabulum, subsequent encounter for fracture with routine healing: Secondary | ICD-10-CM | POA: Diagnosis not present

## 2018-11-25 DIAGNOSIS — N390 Urinary tract infection, site not specified: Secondary | ICD-10-CM | POA: Diagnosis not present

## 2018-11-25 DIAGNOSIS — M1990 Unspecified osteoarthritis, unspecified site: Secondary | ICD-10-CM | POA: Diagnosis not present

## 2018-11-25 DIAGNOSIS — I5032 Chronic diastolic (congestive) heart failure: Secondary | ICD-10-CM | POA: Diagnosis not present

## 2018-11-25 DIAGNOSIS — I110 Hypertensive heart disease with heart failure: Secondary | ICD-10-CM | POA: Diagnosis not present

## 2018-11-26 DIAGNOSIS — H548 Legal blindness, as defined in USA: Secondary | ICD-10-CM | POA: Diagnosis not present

## 2018-11-26 DIAGNOSIS — R69 Illness, unspecified: Secondary | ICD-10-CM | POA: Diagnosis not present

## 2018-11-26 DIAGNOSIS — E1151 Type 2 diabetes mellitus with diabetic peripheral angiopathy without gangrene: Secondary | ICD-10-CM | POA: Diagnosis not present

## 2018-11-26 DIAGNOSIS — I110 Hypertensive heart disease with heart failure: Secondary | ICD-10-CM | POA: Diagnosis not present

## 2018-11-26 DIAGNOSIS — N390 Urinary tract infection, site not specified: Secondary | ICD-10-CM | POA: Diagnosis not present

## 2018-11-26 DIAGNOSIS — M1990 Unspecified osteoarthritis, unspecified site: Secondary | ICD-10-CM | POA: Diagnosis not present

## 2018-11-26 DIAGNOSIS — R41841 Cognitive communication deficit: Secondary | ICD-10-CM | POA: Diagnosis not present

## 2018-11-26 DIAGNOSIS — S32401D Unspecified fracture of right acetabulum, subsequent encounter for fracture with routine healing: Secondary | ICD-10-CM | POA: Diagnosis not present

## 2018-11-26 DIAGNOSIS — I5032 Chronic diastolic (congestive) heart failure: Secondary | ICD-10-CM | POA: Diagnosis not present

## 2018-11-30 DIAGNOSIS — N390 Urinary tract infection, site not specified: Secondary | ICD-10-CM | POA: Diagnosis not present

## 2018-11-30 DIAGNOSIS — R41841 Cognitive communication deficit: Secondary | ICD-10-CM | POA: Diagnosis not present

## 2018-11-30 DIAGNOSIS — E1151 Type 2 diabetes mellitus with diabetic peripheral angiopathy without gangrene: Secondary | ICD-10-CM | POA: Diagnosis not present

## 2018-11-30 DIAGNOSIS — M1990 Unspecified osteoarthritis, unspecified site: Secondary | ICD-10-CM | POA: Diagnosis not present

## 2018-11-30 DIAGNOSIS — H548 Legal blindness, as defined in USA: Secondary | ICD-10-CM | POA: Diagnosis not present

## 2018-11-30 DIAGNOSIS — S32401D Unspecified fracture of right acetabulum, subsequent encounter for fracture with routine healing: Secondary | ICD-10-CM | POA: Diagnosis not present

## 2018-11-30 DIAGNOSIS — I110 Hypertensive heart disease with heart failure: Secondary | ICD-10-CM | POA: Diagnosis not present

## 2018-11-30 DIAGNOSIS — I5032 Chronic diastolic (congestive) heart failure: Secondary | ICD-10-CM | POA: Diagnosis not present

## 2018-11-30 DIAGNOSIS — R69 Illness, unspecified: Secondary | ICD-10-CM | POA: Diagnosis not present

## 2018-12-02 ENCOUNTER — Other Ambulatory Visit: Payer: Self-pay | Admitting: Family Medicine

## 2018-12-02 ENCOUNTER — Other Ambulatory Visit: Payer: Self-pay | Admitting: Cardiology

## 2018-12-02 DIAGNOSIS — H548 Legal blindness, as defined in USA: Secondary | ICD-10-CM | POA: Diagnosis not present

## 2018-12-02 DIAGNOSIS — S32401D Unspecified fracture of right acetabulum, subsequent encounter for fracture with routine healing: Secondary | ICD-10-CM | POA: Diagnosis not present

## 2018-12-02 DIAGNOSIS — R69 Illness, unspecified: Secondary | ICD-10-CM | POA: Diagnosis not present

## 2018-12-02 DIAGNOSIS — E1151 Type 2 diabetes mellitus with diabetic peripheral angiopathy without gangrene: Secondary | ICD-10-CM | POA: Diagnosis not present

## 2018-12-02 DIAGNOSIS — I5032 Chronic diastolic (congestive) heart failure: Secondary | ICD-10-CM | POA: Diagnosis not present

## 2018-12-02 DIAGNOSIS — M1990 Unspecified osteoarthritis, unspecified site: Secondary | ICD-10-CM | POA: Diagnosis not present

## 2018-12-02 DIAGNOSIS — I110 Hypertensive heart disease with heart failure: Secondary | ICD-10-CM | POA: Diagnosis not present

## 2018-12-02 DIAGNOSIS — R41841 Cognitive communication deficit: Secondary | ICD-10-CM | POA: Diagnosis not present

## 2018-12-02 DIAGNOSIS — N390 Urinary tract infection, site not specified: Secondary | ICD-10-CM | POA: Diagnosis not present

## 2018-12-14 ENCOUNTER — Other Ambulatory Visit: Payer: Self-pay | Admitting: Family Medicine

## 2018-12-15 ENCOUNTER — Encounter: Payer: Self-pay | Admitting: Internal Medicine

## 2018-12-17 ENCOUNTER — Other Ambulatory Visit: Payer: Self-pay | Admitting: Family Medicine

## 2018-12-20 ENCOUNTER — Other Ambulatory Visit: Payer: Self-pay | Admitting: Family Medicine

## 2018-12-20 ENCOUNTER — Other Ambulatory Visit: Payer: Self-pay | Admitting: Cardiology

## 2018-12-20 NOTE — Telephone Encounter (Signed)
Ok to refill??  Last office visit 11/01/2018.  Last refill 11/23/2018.

## 2019-01-06 IMAGING — DX DG CHEST 2V
2 series · 2 of 2 positions shown · non-contrast
Comparison: Chest radiograph dated 09/27/2014

CLINICAL DATA: 89-year-old female with abdominal pain, nausea
vomiting. History of breast cancer.

EXAM:
CHEST  2 VIEW

[chest ap]
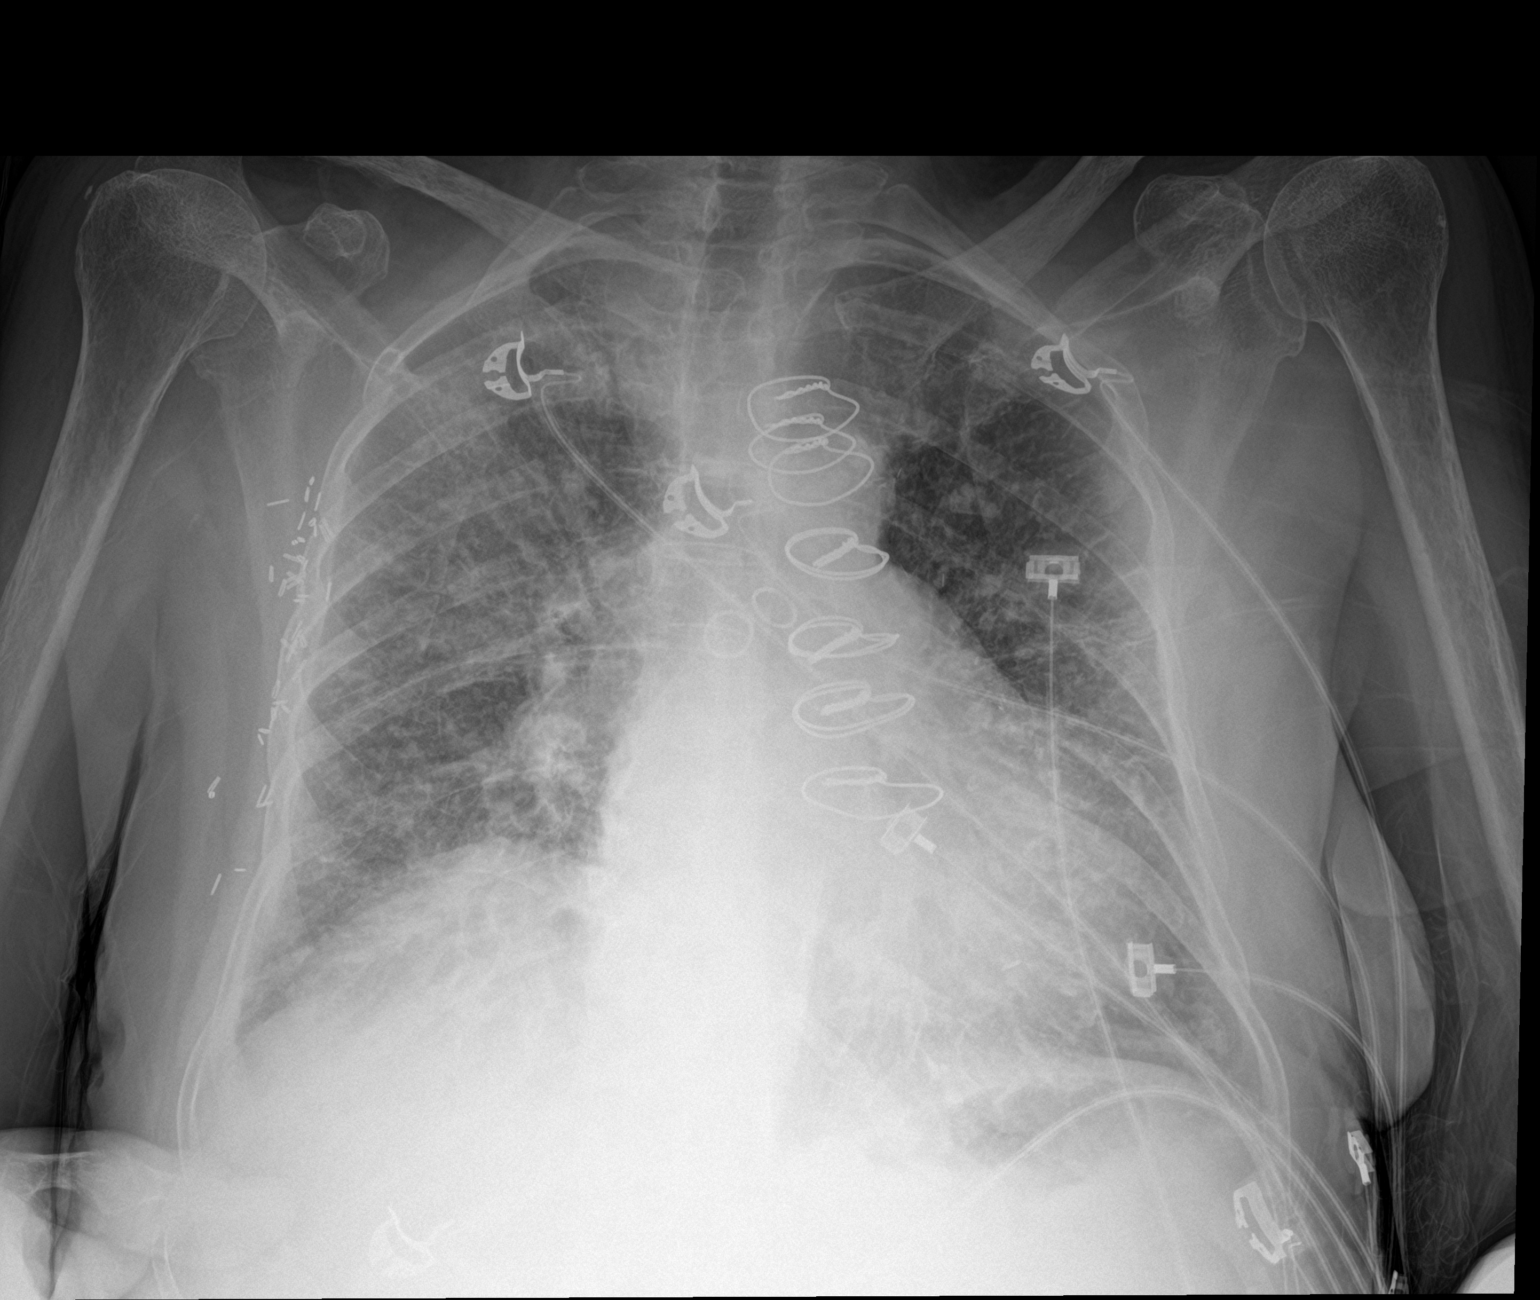

[chest lat]
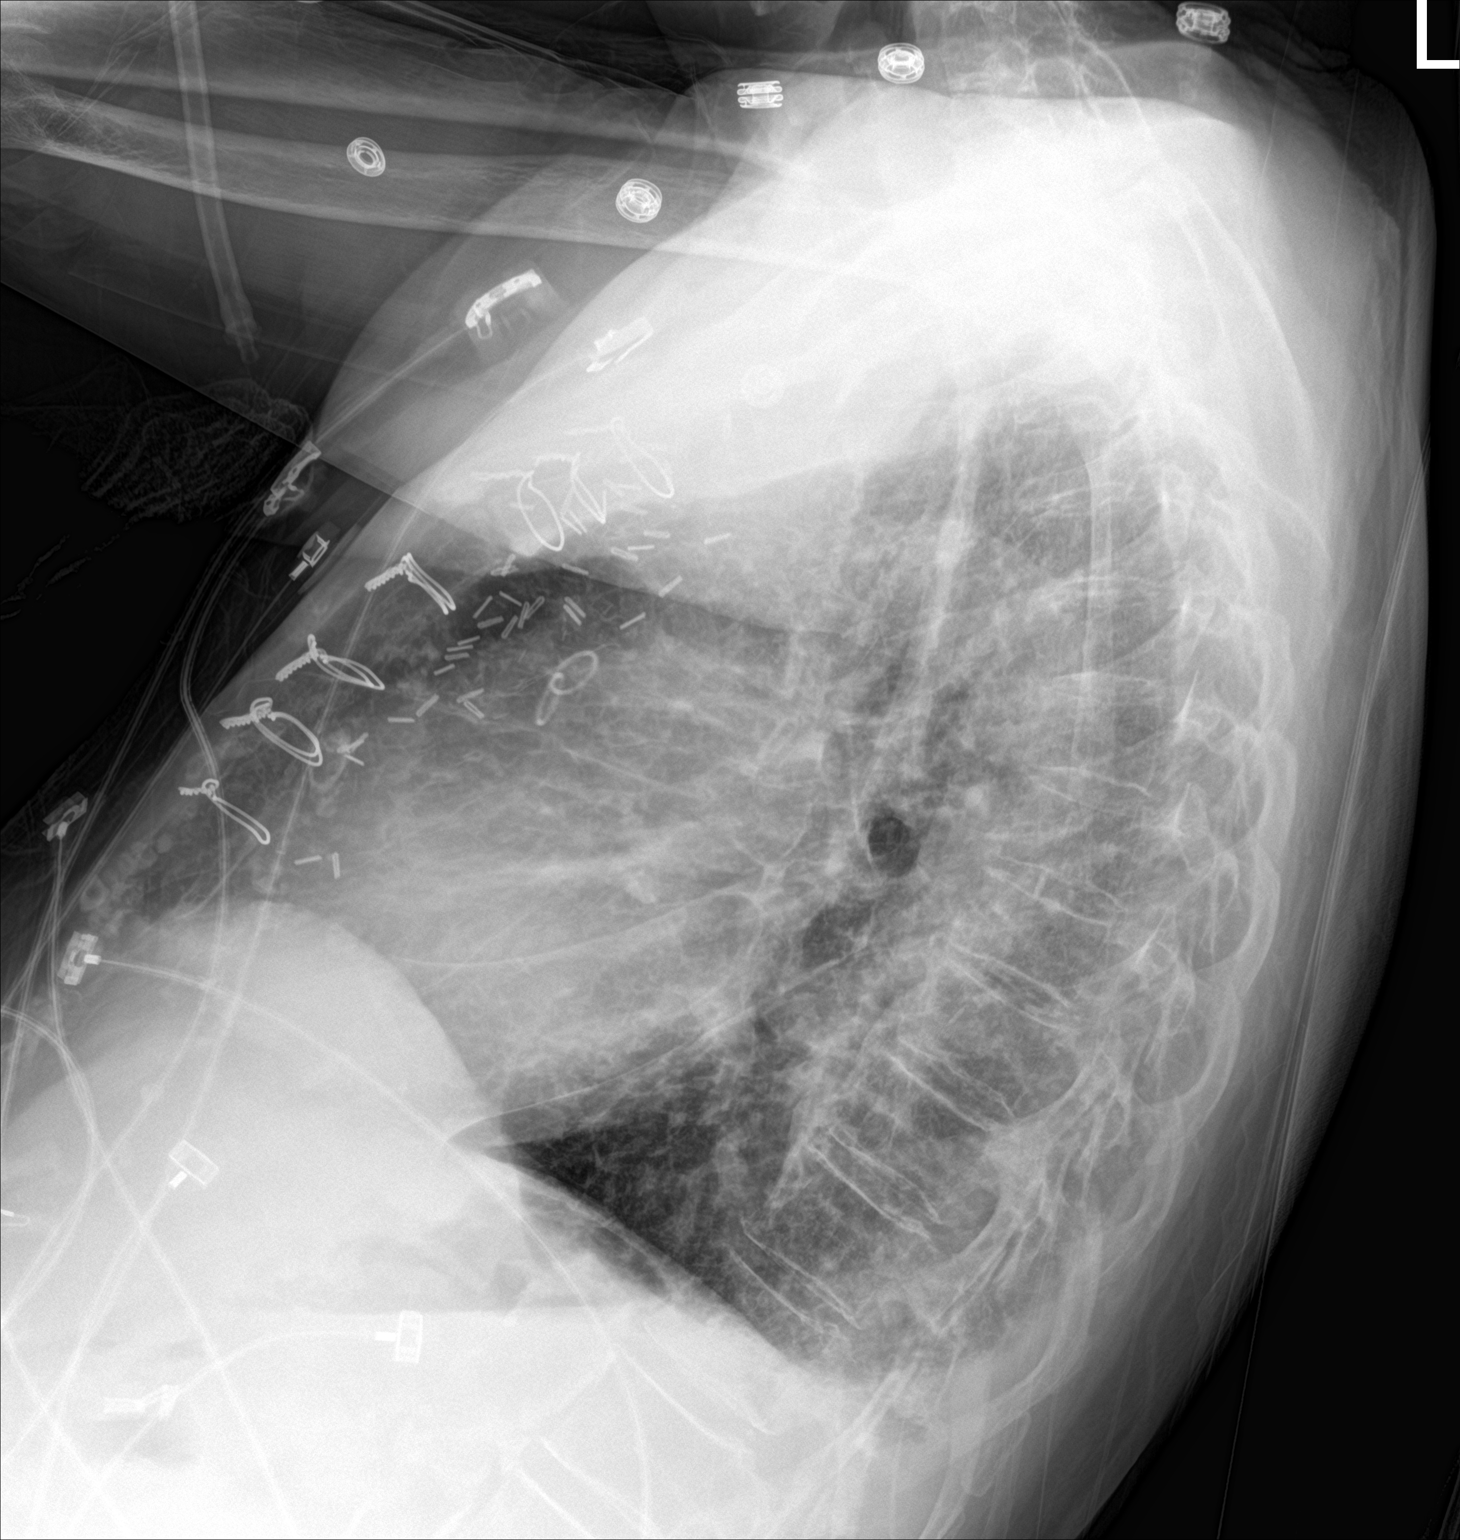

[2 of 2 positions shown; findings below may reference images not displayed]

FINDINGS: There is mild cardiomegaly. There is diffuse interstitial prominence
and nodularity which may represent interstitial edema, or
lymphangitic spread of tumor on this patient with history of breast
cancer. Atypical pneumonia is not excluded. Clinical correlation is
recommended. There is no pleural effusion or pneumothorax. Median
sternotomy wires and CABG vascular clips. Surgical clips noted over
the right side of chest. No acute osseous pathology.
IMPRESSION: Diffuse interstitial prominence and nodularity which may represent
edema but is concerning for lymphangitic spread of tumor in this
patient with history of breast cancer. Pneumonia is not excluded.
Clinical correlation is recommended.

## 2019-01-07 DIAGNOSIS — R69 Illness, unspecified: Secondary | ICD-10-CM | POA: Diagnosis not present

## 2019-01-25 DIAGNOSIS — R69 Illness, unspecified: Secondary | ICD-10-CM | POA: Diagnosis not present

## 2019-02-06 ENCOUNTER — Other Ambulatory Visit: Payer: Self-pay | Admitting: Family Medicine

## 2019-02-07 NOTE — Telephone Encounter (Signed)
Ok to refill??  Last office visit 11/01/2018.  Last refill 12/21/2018.

## 2019-02-13 ENCOUNTER — Other Ambulatory Visit: Payer: Self-pay | Admitting: Family Medicine

## 2019-02-13 ENCOUNTER — Other Ambulatory Visit: Payer: Self-pay | Admitting: Cardiology

## 2019-03-07 ENCOUNTER — Other Ambulatory Visit: Payer: Self-pay | Admitting: Family Medicine

## 2019-03-07 NOTE — Telephone Encounter (Signed)
Ok to refill??  Last office visit 11/01/2018.  Last refill 02/07/2019.  Ok to add refills to prescription?

## 2019-03-14 ENCOUNTER — Telehealth: Payer: Self-pay | Admitting: *Deleted

## 2019-03-14 NOTE — Telephone Encounter (Signed)
[name] [name]) called to follow up on paperwork.  She dropped it off on 03/04/2019.  ([employer] for Creon paperwork)  [phone]

## 2019-03-14 NOTE — Telephone Encounter (Signed)
Spoke with pts daughter. Paperwork was submitted and waiting to hear back from [employer].

## 2019-03-28 ENCOUNTER — Telehealth: Payer: Self-pay | Admitting: Internal Medicine

## 2019-03-28 ENCOUNTER — Other Ambulatory Visit: Payer: Self-pay | Admitting: Family Medicine

## 2019-03-28 DIAGNOSIS — F32A Depression, unspecified: Secondary | ICD-10-CM

## 2019-03-28 DIAGNOSIS — F329 Major depressive disorder, single episode, unspecified: Secondary | ICD-10-CM

## 2019-03-28 NOTE — Telephone Encounter (Signed)
Spoke with [name] samples are ready for pickup  and new form is ready for pickup. Creon application will be submitted when received.

## 2019-03-28 NOTE — Telephone Encounter (Signed)
Spoke with [employer] pt assistance and they are no longer accepting applications for Creon or Zenpep. They won't be carrying those medications.   Lmom, to explain to pts daughter. [employer] didn't send letter to our office.

## 2019-03-28 NOTE — Telephone Encounter (Signed)
Patient daughter, [name], called and said that the patient assistance has not received the fax from here to cover her Creon.  [phone]

## 2019-03-28 NOTE — Telephone Encounter (Signed)
error 

## 2019-03-31 ENCOUNTER — Other Ambulatory Visit: Payer: Self-pay | Admitting: Cardiology

## 2019-03-31 DIAGNOSIS — Z951 Presence of aortocoronary bypass graft: Secondary | ICD-10-CM

## 2019-04-06 ENCOUNTER — Other Ambulatory Visit: Payer: Self-pay | Admitting: Family Medicine

## 2019-04-07 DIAGNOSIS — R69 Illness, unspecified: Secondary | ICD-10-CM | POA: Diagnosis not present

## 2019-04-08 NOTE — Telephone Encounter (Signed)
Pt assistance paperwork for Creon was signed by RMR and faxed to [facility].

## 2019-04-18 ENCOUNTER — Encounter: Payer: Self-pay | Admitting: Family Medicine

## 2019-04-18 ENCOUNTER — Other Ambulatory Visit: Payer: Self-pay

## 2019-04-18 ENCOUNTER — Ambulatory Visit (INDEPENDENT_AMBULATORY_CARE_PROVIDER_SITE_OTHER): Payer: Medicare HMO | Admitting: Family Medicine

## 2019-04-18 VITALS — BP 136/70 | HR 60 | Temp 97.2°F | Resp 16 | Ht 63.0 in | Wt 146.0 lb

## 2019-04-18 DIAGNOSIS — M25562 Pain in left knee: Secondary | ICD-10-CM | POA: Diagnosis not present

## 2019-04-18 DIAGNOSIS — M25561 Pain in right knee: Secondary | ICD-10-CM | POA: Diagnosis not present

## 2019-04-18 MED ORDER — DICLOFENAC SODIUM 75 MG PO TBEC: 75.0000 mg | DELAYED_RELEASE_TABLET | Freq: Two times a day (BID) | ORAL | 0 refills | Status: DC

## 2019-04-18 NOTE — Progress Notes (Signed)
Subjective:    Patient ID: Misty Edwards, female    DOB: June 22, 1926, 86 y.o.   MRN: [id number]  HPI  Patient presents today complaining of bilateral knee pain.  The knee pain has been present for several months.  She states that it started in November.  Her knees ache and throb at times.  They hurt on the medial and joint lines bilaterally.  The right knee was the first hurt however now the left knee is the worst.  She denies erythema.  There is no warmth.  There is no significant effusion.  There is no swelling.  However she has crepitus with range of motion and pain with range of motion.  She also has pain with standing and walking.  She has not tried anything other than topical creams and also pain pills but with little relief.  She denies any falls or injury.  There is no bruising seen today and there is no tenderness to palpation Past Medical History:  Diagnosis Date   Acute biliary pancreatitis 07/2002   thia was in 07/2004:she still has pseudocyst in tail of pancreas measuring 54 x 33 mm    Adrenal adenoma    bilateral   Anxiety    Chronic pancreatitis ([place])    Detached retina    Diabetes mellitus ([place])    Diverticulosis    History of carcinoma in situ of breast 1988   Hyperlipidemia    Hypertension    IBS (irritable bowel syndrome)    Legally blind    Osteoarthritis    Osteopenia    Upper GI bleed September2004   secondary to gastritis   Past Surgical History:  Procedure Laterality Date   CARDIAC CATHETERIZATION N/A 08/24/2014   Procedure: Left Heart Cath and Coronary Angiography;  Surgeon: Leonie Man, MD;  Location: [facility];  Service: Cardiovascular;  Laterality: N/A;   COLONOSCOPY  08/15/05   few tiny diverticula at sigmoid colon/external hemorrhoids but no polyps   COLONOSCOPY  01/08/2012   [id number] diverticulosis. Next colonoscopy in 12/2016   CORONARY ARTERY BYPASS GRAFT N/A 08/24/2014   Procedure: CORONARY ARTERY BYPASS GRAFTING  (CABG) x three, using left internal mammary artery and right leg greater saphenous vein harvested endoscopically;  Surgeon: Ivin Poot, MD;  Location: [facility];  Service: Open Heart Surgery;  Laterality: N/A;   ECTOPIC PREGNANCY SURGERY  1950's   ERCP with sphincterotomy  07/2002   ESOPHAGOGASTRODUODENOSCOPY      Gastritis of body.  Otherwise normal   ESOPHAGOGASTRODUODENOSCOPY  01/08/2012   RMR: Few scattered gastric erosions of uncertain significance-status post biopsy. Minimal chronic inflammation, no H.pylori   LAPAROSCOPIC CHOLECYSTECTOMY     PANCREATIC PSEUDOCYST DRAINAGE  [id number]   drained percutaneously    right mastectomy     tacking up of her bladder     TEE WITHOUT CARDIOVERSION N/A 08/24/2014   Procedure: TRANSESOPHAGEAL ECHOCARDIOGRAM (TEE);  Surgeon: Ivin Poot, MD;  Location: [facility];  Service: Open Heart Surgery;  Laterality: N/A;   Current Outpatient Medications on File Prior to Visit  Medication Sig Dispense Refill   amLODipine (NORVASC) 5 MG tablet Take 1 tablet by mouth once daily 90 tablet 3   aspirin EC 81 MG EC tablet Take 1 tablet (81 mg total) by mouth daily.     Blood Glucose Monitoring Suppl (ONE TOUCH ULTRA 2) w/Device KIT Use as directed to monitor FSBS 3x daily. Dx: E11.9. 1 each 0   brimonidine-timolol (COMBIGAN) 0.2-0.5 % ophthalmic  solution Place 1 drop into both eyes every 12 (twelve) hours.     CREON 24000-76000 units CPEP TAKE 2 CAPSULES BY MOUTH WITH MEALS AND 1 CAP WITH SNACKS x 2 (Patient taking differently: Take 1-2 capsules by mouth 5 (five) times daily. Patient takes 2 capsules with meals and 1 capsule with snacks) 300 capsule 11   cycloSPORINE (RESTASIS) 0.05 % ophthalmic emulsion Place 1 drop into both eyes 3 (three) times daily as needed.      EQ ALLERGY RELIEF, CETIRIZINE, 10 MG tablet Take 1 tablet by mouth once daily 90 tablet 3   feeding supplement, ENSURE ENLIVE, (ENSURE ENLIVE) LIQD Take 237 mLs by mouth 2 (two) times  daily between meals. 237 mL 12   ferrous sulfate 325 (65 FE) MG tablet Take 325 mg by mouth daily with breakfast.     FLUoxetine (PROZAC) 20 MG tablet Take 1 tablet by mouth once daily 90 tablet 0   gabapentin (NEURONTIN) 100 MG capsule TAKE 1 CAPSULE BY MOUTH THREE TIMES DAILY AS NEEDED FOR  NERVE  PAIN 90 capsule 0   glucose blood (ONE TOUCH ULTRA TEST) test strip Use as directed to monitor FSBS 5 x daily. Dx: E11.9. 450 each 3   Insulin Syringes, Disposable, U-100 1 ML MISC 1/2 inch 31 g 300 each 3   lisinopril (ZESTRIL) 2.5 MG tablet Take 1 tablet by mouth once daily 90 tablet 0   LORazepam (ATIVAN) 0.5 MG tablet TAKE 1 TO 2 TABLETS BY MOUTH AT BEDTIME AS NEEDED FOR INSOMNIA 60 tablet 3   magnesium oxide (MAG-OX) 400 (241.3 Mg) MG tablet Take 1 tablet by mouth once daily 90 tablet 1   Magnesium Oxide 400 (240 Mg) MG TABS TAKE 1 TABLET BY MOUTH ONCE DAILY 90 tablet 3   metFORMIN (GLUCOPHAGE) 1000 MG tablet Take 500 mg by mouth 2 (two) times daily with a meal.     metoprolol tartrate (LOPRESSOR) 25 MG tablet Take 1 tablet by mouth twice daily 180 tablet 3   Multiple Vitamin (MULTIVITAMIN) capsule Take 1 capsule by mouth daily.     neomycin-polymyxin b-dexamethasone (MAXITROL) 3.5-10000-0.1 OINT APPLY OINTMENT INTO LEFT EYE THREE TIMES DAILY AS DIRECTED     NOVOLIN 70/30 RELION (70-30) 100 UNIT/ML injection INJECT 25 UNITS SUBCUTANEOUSLY IN THE MORNING AND 15 IN THE EVENING     OneTouch Delica Lancets 91M MISC Use as directed to monitor checks BS QID-5 x's a day. Dx: E11.9. 500 each 2   pantoprazole (PROTONIX) 40 MG tablet Take 1 tablet by mouth once daily 90 tablet 1   polyethylene glycol (MIRALAX / GLYCOLAX) 17 g packet Take 17 g by mouth daily. 14 each 0   potassium chloride (KLOR-CON) 10 MEQ tablet TAKE 1 TABLET BY MOUTH ONCE DAILY AS NEEDED. TAKE WITH FUROSEMIDE. 90 tablet 3   RELION INSULIN SYR 0.5ML/31G 31G X 5/16" 0.5 ML MISC USE AS DIRECTED 300 each 0   rosuvastatin  (CRESTOR) 5 MG tablet Take 1 tablet by mouth once daily 90 tablet 1   triamcinolone cream (KENALOG) 0.1 % APPLY  CREAM EXTERNALLY TWICE DAILY (Patient taking differently: as needed. ) 60 g 1   Wheat Dextrin (BENEFIBER DRINK MIX) PACK Take 1 Package by mouth daily.     furosemide (LASIX) 20 MG tablet Take 20 mg by mouth daily.     No current facility-administered medications on file prior to visit.   Allergies  Allergen Reactions   Calcium-Containing Compounds Nausea Only   Morphine And Related Other (  See Comments)    Hallucination   Raloxifene Itching    Evista- Face and eyes burning   Vitamin D Analogs Nausea Only   Social History   Socioeconomic History   Marital status: Widowed    Spouse name: Not on file   Number of children: 5   Years of education: Not on file   Highest education level: Not on file  Occupational History   Occupation: homemaker  Tobacco Use   Smoking status: Never Smoker   Smokeless tobacco: Never Used  Substance and Sexual Activity   Alcohol use: No    Alcohol/week: 0.0 standard drinks   Drug use: No   Sexual activity: Not Currently    Birth control/protection: Post-menopausal  Other Topics Concern   Not on file  Social History Narrative   Lives in [place].   Social Determinants of Health   Financial Resource Strain:    Difficulty of Paying Living Expenses: Not on file  Food Insecurity:    Worried About Charity fundraiser in the Last Year: Not on file   [employer] of Food in the Last Year: Not on file  Transportation Needs:    Lack of Transportation (Medical): Not on file   Lack of Transportation (Non-Medical): Not on file  Physical Activity:    Days of Exercise per Week: Not on file   Minutes of Exercise per Session: Not on file  Stress:    Feeling of Stress : Not on file  Social Connections:    Frequency of Communication with Friends and Family: Not on file   Frequency of Social Gatherings with Friends and  Family: Not on file   Attends Religious Services: Not on file   Active Member of Clubs or Organizations: Not on file   Attends Archivist Meetings: Not on file   Marital Status: Not on file  Intimate Partner Violence:    Fear of Current or Ex-Partner: Not on file   Emotionally Abused: Not on file   Physically Abused: Not on file   Sexually Abused: Not on file     Review of Systems  All other systems reviewed and are negative.      Objective:   Physical Exam Constitutional:      Appearance: Normal appearance.  Cardiovascular:     Rate and Rhythm: Normal rate and regular rhythm.  Pulmonary:     Effort: Pulmonary effort is normal.     Breath sounds: Normal breath sounds.  Musculoskeletal:     Right knee: No swelling, deformity, effusion, erythema or bony tenderness. Decreased range of motion. Tenderness present over the medial joint line and lateral joint line.     Left knee: No swelling, deformity, effusion or erythema. Decreased range of motion. Tenderness present over the medial joint line and lateral joint line.  Neurological:     Mental Status: She is alert.           Assessment & Plan:  Acute pain of both knees  I believe the patient's knee pain is most likely due to osteoarthritis.  We discussed bilateral cortisone injections today however the patient is very afraid of needles and does not want that performed.  She has no history of a bleeding ulcer.  Her last lab work in the fall showed no evidence of renal dysfunction.  Therefore we will try a low-dose NSAID.  She will try diclofenac 75 mg twice daily as needed.  I asked her to limit this to less than 2 weeks  to avoid GI toxicity.  Also asked her to drink plenty of water.  I described the risk of taking this medication long-term to the patient.  If her knee pain is no better in 2 weeks I would recommend cortisone injections.

## 2019-04-26 DIAGNOSIS — R69 Illness, unspecified: Secondary | ICD-10-CM | POA: Diagnosis not present

## 2019-04-28 ENCOUNTER — Telehealth: Payer: Self-pay | Admitting: Family Medicine

## 2019-04-28 NOTE — Telephone Encounter (Signed)
[facility] can not get Ativan .5mg  until march and they are requesting ativan 1mg  1/2 tab #30?

## 2019-04-29 MED ORDER — LORAZEPAM 1 MG PO TABS: ORAL_TABLET | ORAL | 1 refills | Status: DC

## 2019-04-29 NOTE — Telephone Encounter (Signed)
Ok with the change

## 2019-04-29 NOTE — Telephone Encounter (Signed)
Please send rx

## 2019-05-03 NOTE — Telephone Encounter (Signed)
[facility] mailed letter to pt stating financial informations was needed. financial information was previous submitted. Pt's daughters gave more documents today in office. Forms were faxed to company. Waiting on a response from pt.

## 2019-05-17 ENCOUNTER — Other Ambulatory Visit: Payer: Self-pay | Admitting: Cardiology

## 2019-05-17 ENCOUNTER — Other Ambulatory Visit: Payer: Self-pay | Admitting: Family Medicine

## 2019-05-20 ENCOUNTER — Other Ambulatory Visit: Payer: Self-pay | Admitting: Family Medicine

## 2019-05-25 NOTE — Telephone Encounter (Signed)
Called [employer] assist, pt's medication Creon was approved and delivered to pt on 05/18/2019.

## 2019-06-10 DIAGNOSIS — R69 Illness, unspecified: Secondary | ICD-10-CM | POA: Diagnosis not present

## 2019-06-16 ENCOUNTER — Other Ambulatory Visit: Payer: Self-pay | Admitting: Family Medicine

## 2019-06-16 DIAGNOSIS — F329 Major depressive disorder, single episode, unspecified: Secondary | ICD-10-CM

## 2019-06-16 DIAGNOSIS — F32A Depression, unspecified: Secondary | ICD-10-CM

## 2019-06-16 NOTE — Telephone Encounter (Signed)
Requesting refill    Ativan  LOV: 04/18/2019  LRF:  04/29/2019

## 2019-06-30 ENCOUNTER — Other Ambulatory Visit: Payer: Self-pay | Admitting: Family Medicine

## 2019-07-18 ENCOUNTER — Other Ambulatory Visit: Payer: Self-pay | Admitting: Family Medicine

## 2019-07-18 NOTE — Telephone Encounter (Signed)
Ok to refill??  Last office visit 04/18/2019.  Last refill 06/16/2019.

## 2019-07-26 DIAGNOSIS — R69 Illness, unspecified: Secondary | ICD-10-CM | POA: Diagnosis not present

## 2019-08-10 ENCOUNTER — Other Ambulatory Visit: Payer: Self-pay | Admitting: Family Medicine

## 2019-08-11 NOTE — Telephone Encounter (Signed)
Ok to refill Lorazepam??  Last office visit 04/18/2019.  Last refill 07/19/2019.

## 2019-09-14 ENCOUNTER — Other Ambulatory Visit: Payer: Self-pay | Admitting: Family Medicine

## 2019-09-15 NOTE — Telephone Encounter (Signed)
Requested Prescriptions   Pending Prescriptions Disp Refills   LORazepam (ATIVAN) 1 MG tablet [Pharmacy Med Name: LORazepam 1 MG Oral Tablet] 30 tablet 0    Sig: TAKE 1/2 TO 1 (ONE-HALF TO ONE) TABLET BY MOUTH TWICE DAILY AS NEEDED     Last OV 04/18/2019   Last written 08/11/2019

## 2019-09-21 ENCOUNTER — Other Ambulatory Visit: Payer: Self-pay | Admitting: Family Medicine

## 2019-09-21 ENCOUNTER — Other Ambulatory Visit: Payer: Self-pay | Admitting: Cardiology

## 2019-09-21 DIAGNOSIS — Z951 Presence of aortocoronary bypass graft: Secondary | ICD-10-CM

## 2019-09-23 ENCOUNTER — Inpatient Hospital Stay (HOSPITAL_COMMUNITY)
Admission: EM | Admit: 2019-09-23 | Discharge: 2019-09-26 | DRG: 291 | Disposition: A | Discharge status: Home / Self Care | Payer: Medicare HMO | Attending: Internal Medicine | Admitting: Internal Medicine

## 2019-09-23 ENCOUNTER — Other Ambulatory Visit: Payer: Self-pay

## 2019-09-23 ENCOUNTER — Emergency Department (HOSPITAL_COMMUNITY): Payer: Medicare HMO

## 2019-09-23 ENCOUNTER — Other Ambulatory Visit: Payer: Self-pay | Admitting: Family Medicine

## 2019-09-23 DIAGNOSIS — E871 Hypo-osmolality and hyponatremia: Secondary | ICD-10-CM | POA: Diagnosis not present

## 2019-09-23 DIAGNOSIS — R0902 Hypoxemia: Secondary | ICD-10-CM | POA: Diagnosis not present

## 2019-09-23 DIAGNOSIS — Z885 Allergy status to narcotic agent status: Secondary | ICD-10-CM

## 2019-09-23 DIAGNOSIS — E785 Hyperlipidemia, unspecified: Secondary | ICD-10-CM | POA: Diagnosis present

## 2019-09-23 DIAGNOSIS — I252 Old myocardial infarction: Secondary | ICD-10-CM | POA: Diagnosis not present

## 2019-09-23 DIAGNOSIS — R54 Age-related physical debility: Secondary | ICD-10-CM | POA: Diagnosis present

## 2019-09-23 DIAGNOSIS — J9601 Acute respiratory failure with hypoxia: Secondary | ICD-10-CM | POA: Diagnosis not present

## 2019-09-23 DIAGNOSIS — I160 Hypertensive urgency: Secondary | ICD-10-CM | POA: Diagnosis present

## 2019-09-23 DIAGNOSIS — I5031 Acute diastolic (congestive) heart failure: Secondary | ICD-10-CM | POA: Diagnosis not present

## 2019-09-23 DIAGNOSIS — R0603 Acute respiratory distress: Secondary | ICD-10-CM

## 2019-09-23 DIAGNOSIS — Z7982 Long term (current) use of aspirin: Secondary | ICD-10-CM | POA: Diagnosis not present

## 2019-09-23 DIAGNOSIS — Z20822 Contact with and (suspected) exposure to covid-19: Secondary | ICD-10-CM | POA: Diagnosis not present

## 2019-09-23 DIAGNOSIS — K861 Other chronic pancreatitis: Secondary | ICD-10-CM

## 2019-09-23 DIAGNOSIS — Z79899 Other long term (current) drug therapy: Secondary | ICD-10-CM

## 2019-09-23 DIAGNOSIS — K589 Irritable bowel syndrome without diarrhea: Secondary | ICD-10-CM | POA: Diagnosis present

## 2019-09-23 DIAGNOSIS — R339 Retention of urine, unspecified: Secondary | ICD-10-CM | POA: Diagnosis present

## 2019-09-23 DIAGNOSIS — E1122 Type 2 diabetes mellitus with diabetic chronic kidney disease: Secondary | ICD-10-CM | POA: Diagnosis present

## 2019-09-23 DIAGNOSIS — Z86000 Personal history of in-situ neoplasm of breast: Secondary | ICD-10-CM

## 2019-09-23 DIAGNOSIS — I5033 Acute on chronic diastolic (congestive) heart failure: Secondary | ICD-10-CM

## 2019-09-23 DIAGNOSIS — E1151 Type 2 diabetes mellitus with diabetic peripheral angiopathy without gangrene: Secondary | ICD-10-CM | POA: Diagnosis not present

## 2019-09-23 DIAGNOSIS — N1832 Chronic kidney disease, stage 3b: Secondary | ICD-10-CM | POA: Diagnosis not present

## 2019-09-23 DIAGNOSIS — Z9049 Acquired absence of other specified parts of digestive tract: Secondary | ICD-10-CM | POA: Diagnosis not present

## 2019-09-23 DIAGNOSIS — R230 Cyanosis: Secondary | ICD-10-CM | POA: Diagnosis not present

## 2019-09-23 DIAGNOSIS — Z9011 Acquired absence of right breast and nipple: Secondary | ICD-10-CM | POA: Diagnosis not present

## 2019-09-23 DIAGNOSIS — T501X6A Underdosing of loop [high-ceiling] diuretics, initial encounter: Secondary | ICD-10-CM | POA: Diagnosis present

## 2019-09-23 DIAGNOSIS — N179 Acute kidney failure, unspecified: Secondary | ICD-10-CM | POA: Diagnosis not present

## 2019-09-23 DIAGNOSIS — J810 Acute pulmonary edema: Secondary | ICD-10-CM

## 2019-09-23 DIAGNOSIS — R0602 Shortness of breath: Secondary | ICD-10-CM | POA: Diagnosis not present

## 2019-09-23 DIAGNOSIS — H548 Legal blindness, as defined in USA: Secondary | ICD-10-CM | POA: Diagnosis present

## 2019-09-23 DIAGNOSIS — F419 Anxiety disorder, unspecified: Secondary | ICD-10-CM | POA: Diagnosis present

## 2019-09-23 DIAGNOSIS — I10 Essential (primary) hypertension: Secondary | ICD-10-CM

## 2019-09-23 DIAGNOSIS — R069 Unspecified abnormalities of breathing: Secondary | ICD-10-CM | POA: Diagnosis not present

## 2019-09-23 DIAGNOSIS — J90 Pleural effusion, not elsewhere classified: Secondary | ICD-10-CM | POA: Diagnosis not present

## 2019-09-23 DIAGNOSIS — Z951 Presence of aortocoronary bypass graft: Secondary | ICD-10-CM

## 2019-09-23 DIAGNOSIS — R0689 Other abnormalities of breathing: Secondary | ICD-10-CM | POA: Diagnosis not present

## 2019-09-23 DIAGNOSIS — Z794 Long term (current) use of insulin: Secondary | ICD-10-CM

## 2019-09-23 DIAGNOSIS — Z888 Allergy status to other drugs, medicaments and biological substances status: Secondary | ICD-10-CM

## 2019-09-23 DIAGNOSIS — J9600 Acute respiratory failure, unspecified whether with hypoxia or hypercapnia: Secondary | ICD-10-CM | POA: Diagnosis present

## 2019-09-23 DIAGNOSIS — I130 Hypertensive heart and chronic kidney disease with heart failure and stage 1 through stage 4 chronic kidney disease, or unspecified chronic kidney disease: Principal | ICD-10-CM | POA: Diagnosis present

## 2019-09-23 DIAGNOSIS — I2510 Atherosclerotic heart disease of native coronary artery without angina pectoris: Secondary | ICD-10-CM | POA: Diagnosis present

## 2019-09-23 DIAGNOSIS — E1121 Type 2 diabetes mellitus with diabetic nephropathy: Secondary | ICD-10-CM | POA: Diagnosis not present

## 2019-09-23 LAB — BASIC METABOLIC PANEL
Anion gap: 15 (ref 5–15)
BUN: 20 mg/dL (ref 8–23)
CO2: 19 mmol/L — ABNORMAL LOW (ref 22–32)
Calcium: 8.7 mg/dL — ABNORMAL LOW (ref 8.9–10.3)
Chloride: 95 mmol/L — ABNORMAL LOW (ref 98–111)
Creatinine, Ser: 1.17 mg/dL — ABNORMAL HIGH (ref 0.44–1.00)
GFR calc Af Amer: 48 mL/min — ABNORMAL LOW (ref >60)
GFR calc non Af Amer: 41 mL/min — ABNORMAL LOW (ref >60)
Glucose, Bld: 258 mg/dL — ABNORMAL HIGH (ref 70–99)
Potassium: 4.5 mmol/L (ref 3.5–5.1)
Sodium: 129 mmol/L — ABNORMAL LOW (ref 135–145)

## 2019-09-23 LAB — CBC WITH DIFFERENTIAL/PLATELET
Abs Immature Granulocytes: 0.06 10*3/uL (ref 0.00–0.07)
Basophils Absolute: 0.1 10*3/uL (ref 0.0–0.1)
Basophils Relative: 0 %
Eosinophils Absolute: 0.2 10*3/uL (ref 0.0–0.5)
Eosinophils Relative: 1 %
HCT: 38.7 % (ref 36.0–46.0)
Hemoglobin: 12.3 g/dL (ref 12.0–15.0)
Immature Granulocytes: 1 %
Lymphocytes Relative: 45 %
Lymphs Abs: 5.7 10*3/uL — ABNORMAL HIGH (ref 0.7–4.0)
MCH: 28.7 pg (ref 26.0–34.0)
MCHC: 31.8 g/dL (ref 30.0–36.0)
MCV: 90.4 fL (ref 80.0–100.0)
Monocytes Absolute: 0.7 10*3/uL (ref 0.1–1.0)
Monocytes Relative: 5 %
Neutro Abs: 6.1 10*3/uL (ref 1.7–7.7)
Neutrophils Relative %: 48 %
Platelets: 204 10*3/uL (ref 150–400)
RBC: 4.28 MIL/uL (ref 3.87–5.11)
RDW: 13.6 % (ref 11.5–15.5)
WBC: 12.7 10*3/uL — ABNORMAL HIGH (ref 4.0–10.5)
nRBC: 0.2 % (ref 0.0–0.2)

## 2019-09-23 LAB — TROPONIN I (HIGH SENSITIVITY): Troponin I (High Sensitivity): 6 ng/L (ref <18)

## 2019-09-23 LAB — GLUCOSE, CAPILLARY: Glucose-Capillary: 161 mg/dL — ABNORMAL HIGH (ref 70–99)

## 2019-09-23 LAB — I-STAT CHEM 8, ED
BUN: 22 mg/dL (ref 8–23)
Calcium, Ion: 1.16 mmol/L (ref 1.15–1.40)
Chloride: 96 mmol/L — ABNORMAL LOW (ref 98–111)
Creatinine, Ser: 1.2 mg/dL — ABNORMAL HIGH (ref 0.44–1.00)
Glucose, Bld: 261 mg/dL — ABNORMAL HIGH (ref 70–99)
HCT: 38 % (ref 36.0–46.0)
Hemoglobin: 12.9 g/dL (ref 12.0–15.0)
Potassium: 4.9 mmol/L (ref 3.5–5.1)
Sodium: 130 mmol/L — ABNORMAL LOW (ref 135–145)
TCO2: 22 mmol/L (ref 22–32)

## 2019-09-23 LAB — SARS CORONAVIRUS 2 BY RT PCR (HOSPITAL ORDER, PERFORMED IN ~~LOC~~ HOSPITAL LAB): SARS Coronavirus 2: NEGATIVE

## 2019-09-23 LAB — BRAIN NATRIURETIC PEPTIDE: B Natriuretic Peptide: 719 pg/mL — ABNORMAL HIGH (ref 0.0–100.0)

## 2019-09-23 LAB — MAGNESIUM: Magnesium: 2 mg/dL (ref 1.7–2.4)

## 2019-09-23 LAB — CBG MONITORING, ED: Glucose-Capillary: 167 mg/dL — ABNORMAL HIGH (ref 70–99)

## 2019-09-23 MED ORDER — FERROUS SULFATE 325 (65 FE) MG PO TABS
325.0000 mg | ORAL_TABLET | Freq: Every day | ORAL | Status: DC
  Administered 2019-09-24 – 2019-09-26 (×3): 325 mg via ORAL
  Filled 2019-09-23 (×3): qty 1

## 2019-09-23 MED ORDER — METOPROLOL TARTRATE 50 MG PO TABS
25.0000 mg | ORAL_TABLET | Freq: Two times a day (BID) | ORAL | Status: DC
  Administered 2019-09-23 – 2019-09-26 (×6): 25 mg via ORAL
  Filled 2019-09-23 (×6): qty 1

## 2019-09-23 MED ORDER — MORPHINE SULFATE (PF) 2 MG/ML IV SOLN
2.0000 mg | Freq: Once | INTRAVENOUS | Status: AC
  Administered 2019-09-23: 2 mg via INTRAVENOUS
  Filled 2019-09-23: qty 1

## 2019-09-23 MED ORDER — CYCLOSPORINE 0.05 % OP EMUL: 1.0000 [drp] | Freq: Three times a day (TID) | OPHTHALMIC | Status: DC | PRN

## 2019-09-23 MED ORDER — TIMOLOL MALEATE 0.5 % OP SOLN
1.0000 [drp] | Freq: Two times a day (BID) | OPHTHALMIC | Status: DC
  Administered 2019-09-24 – 2019-09-26 (×5): 1 [drp] via OPHTHALMIC
  Filled 2019-09-23: qty 5

## 2019-09-23 MED ORDER — ROSUVASTATIN CALCIUM 10 MG PO TABS
5.0000 mg | ORAL_TABLET | Freq: Every day | ORAL | Status: DC
  Administered 2019-09-23 – 2019-09-25 (×3): 5 mg via ORAL
  Filled 2019-09-23 (×3): qty 1

## 2019-09-23 MED ORDER — SODIUM CHLORIDE 0.9 % IV SOLN: 250.0000 mL | INTRAVENOUS | Status: DC | PRN

## 2019-09-23 MED ORDER — LORAZEPAM 0.5 MG PO TABS
0.5000 mg | ORAL_TABLET | Freq: Two times a day (BID) | ORAL | Status: DC | PRN
  Administered 2019-09-24 – 2019-09-25 (×2): 0.5 mg via ORAL
  Filled 2019-09-23 (×2): qty 1

## 2019-09-23 MED ORDER — AMLODIPINE BESYLATE 5 MG PO TABS
5.0000 mg | ORAL_TABLET | Freq: Every day | ORAL | Status: DC
  Administered 2019-09-24 – 2019-09-26 (×3): 5 mg via ORAL
  Filled 2019-09-23 (×3): qty 1

## 2019-09-23 MED ORDER — PANTOPRAZOLE SODIUM 40 MG PO TBEC
40.0000 mg | DELAYED_RELEASE_TABLET | Freq: Every morning | ORAL | Status: DC
  Administered 2019-09-24 – 2019-09-26 (×3): 40 mg via ORAL
  Filled 2019-09-23 (×4): qty 1

## 2019-09-23 MED ORDER — ONDANSETRON HCL 4 MG/2ML IJ SOLN: 4.0000 mg | Freq: Four times a day (QID) | INTRAMUSCULAR | Status: DC | PRN

## 2019-09-23 MED ORDER — BRIMONIDINE TARTRATE 0.2 % OP SOLN
1.0000 [drp] | Freq: Two times a day (BID) | OPHTHALMIC | Status: DC
  Administered 2019-09-24 – 2019-09-26 (×5): 1 [drp] via OPHTHALMIC
  Filled 2019-09-23 (×2): qty 5

## 2019-09-23 MED ORDER — SODIUM CHLORIDE 0.9% FLUSH: 3.0000 mL | INTRAVENOUS | Status: DC | PRN

## 2019-09-23 MED ORDER — LISINOPRIL 5 MG PO TABS
2.5000 mg | ORAL_TABLET | Freq: Every day | ORAL | Status: DC
  Administered 2019-09-24: 2.5 mg via ORAL
  Filled 2019-09-23: qty 1

## 2019-09-23 MED ORDER — ORAL CARE MOUTH RINSE
15.0000 mL | Freq: Two times a day (BID) | OROMUCOSAL | Status: DC
  Administered 2019-09-23 – 2019-09-25 (×5): 15 mL via OROMUCOSAL

## 2019-09-23 MED ORDER — ACETAMINOPHEN 325 MG PO TABS
650.0000 mg | ORAL_TABLET | ORAL | Status: DC | PRN
  Administered 2019-09-23 – 2019-09-25 (×4): 650 mg via ORAL
  Filled 2019-09-23 (×4): qty 2

## 2019-09-23 MED ORDER — NITROGLYCERIN IN D5W 200-5 MCG/ML-% IV SOLN
5.0000 ug/min | INTRAVENOUS | Status: DC
  Administered 2019-09-23: 5 ug/min via INTRAVENOUS
  Filled 2019-09-23: qty 250

## 2019-09-23 MED ORDER — ASPIRIN EC 81 MG PO TBEC
81.0000 mg | DELAYED_RELEASE_TABLET | Freq: Every day | ORAL | Status: DC
  Administered 2019-09-24 – 2019-09-26 (×3): 81 mg via ORAL
  Filled 2019-09-23 (×3): qty 1

## 2019-09-23 MED ORDER — HYDRALAZINE HCL 20 MG/ML IJ SOLN
10.0000 mg | INTRAMUSCULAR | Status: DC | PRN
  Administered 2019-09-25: 10 mg via INTRAVENOUS
  Filled 2019-09-23: qty 1

## 2019-09-23 MED ORDER — HEPARIN SODIUM (PORCINE) 5000 UNIT/ML IJ SOLN
5000.0000 [IU] | Freq: Three times a day (TID) | INTRAMUSCULAR | Status: DC
  Administered 2019-09-23 – 2019-09-26 (×7): 5000 [IU] via SUBCUTANEOUS
  Filled 2019-09-23 (×8): qty 1

## 2019-09-23 MED ORDER — PANCRELIPASE (LIP-PROT-AMYL) 12000-38000 UNITS PO CPEP
24000.0000 [IU] | ORAL_CAPSULE | Freq: Every day | ORAL | Status: DC
  Administered 2019-09-23 – 2019-09-26 (×10): 24000 [IU] via ORAL
  Filled 2019-09-23 (×13): qty 2

## 2019-09-23 MED ORDER — INSULIN ASPART 100 UNIT/ML ~~LOC~~ SOLN
0.0000 [IU] | Freq: Three times a day (TID) | SUBCUTANEOUS | Status: DC
  Administered 2019-09-23: 2 [IU] via SUBCUTANEOUS
  Administered 2019-09-24: 3 [IU] via SUBCUTANEOUS
  Administered 2019-09-24: 2 [IU] via SUBCUTANEOUS
  Administered 2019-09-24: 3 [IU] via SUBCUTANEOUS
  Administered 2019-09-25: 5 [IU] via SUBCUTANEOUS
  Administered 2019-09-25: 9 [IU] via SUBCUTANEOUS
  Filled 2019-09-23: qty 1

## 2019-09-23 MED ORDER — FLUOXETINE HCL 20 MG PO CAPS
20.0000 mg | ORAL_CAPSULE | Freq: Every day | ORAL | Status: DC
  Administered 2019-09-23 – 2019-09-25 (×3): 20 mg via ORAL
  Filled 2019-09-23 (×3): qty 1

## 2019-09-23 MED ORDER — GABAPENTIN 100 MG PO CAPS
100.0000 mg | ORAL_CAPSULE | Freq: Every day | ORAL | Status: DC
  Administered 2019-09-23 – 2019-09-25 (×3): 100 mg via ORAL
  Filled 2019-09-23 (×3): qty 1

## 2019-09-23 MED ORDER — FUROSEMIDE 10 MG/ML IJ SOLN
40.0000 mg | Freq: Every day | INTRAMUSCULAR | Status: DC
  Administered 2019-09-23 – 2019-09-24 (×2): 40 mg via INTRAVENOUS
  Filled 2019-09-23 (×2): qty 4

## 2019-09-23 MED ORDER — FUROSEMIDE 10 MG/ML IJ SOLN
80.0000 mg | Freq: Once | INTRAMUSCULAR | Status: AC
  Administered 2019-09-23: 80 mg via INTRAVENOUS
  Filled 2019-09-23: qty 8

## 2019-09-23 MED ORDER — BRIMONIDINE TARTRATE-TIMOLOL 0.2-0.5 % OP SOLN: 1.0000 [drp] | Freq: Two times a day (BID) | OPHTHALMIC | Status: DC

## 2019-09-23 MED ORDER — SODIUM CHLORIDE 0.9% FLUSH
3.0000 mL | Freq: Two times a day (BID) | INTRAVENOUS | Status: DC
  Administered 2019-09-24 – 2019-09-26 (×5): 3 mL via INTRAVENOUS

## 2019-09-23 NOTE — ED Notes (Signed)
Bladder scan 356mls

## 2019-09-23 NOTE — ED Notes (Signed)
Off BiPAP for now, on 3 lpm  / [place] Saturation 94

## 2019-09-23 NOTE — ED Triage Notes (Signed)
Patient comes to the ED via [facility] for hypoxia, CHF, feeling like she could not breath.

## 2019-09-23 NOTE — ED Provider Notes (Signed)
[facility] EMERGENCY DEPARTMENT Provider Note   CSN: [id number] Arrival date & time: 09/23/19  1344     History Chief Complaint  Patient presents with  . Shortness of Breath    Misty Edwards is a 86 y.o. female.  HPI   86 year old female with respiratory distress.  History from EMS.  Patient is coming from home.  Apparently woke up this morning in her usual state of health.  Shortly before arrival she was noted by her daughter to have significant difficulty with her breathing.  On EMS arrival she was awake but not following commands.  Diaphoretic.  Initial O2 sats were in the 30s on room air.  She was started on CPAP and brought to the emergency room.  Upon arrival she still remained in obvious distress.  Unable to speak but would occasionally answer yes/no questions by nodding her head.  Denies any pain anywhere.  Unable to obtain further history due to acuity of illness.  Past Medical History:  Diagnosis Date  . Acute biliary pancreatitis 07/2002   thia was in 07/2004:she still has pseudocyst in tail of pancreas measuring 54 x 33 mm   . Adrenal adenoma    bilateral  . Anxiety   . Chronic pancreatitis ([place])   . Detached retina   . Diabetes mellitus ([place])   . Diverticulosis   . History of carcinoma in situ of breast 1988  . Hyperlipidemia   . Hypertension   . IBS (irritable bowel syndrome)   . Legally blind   . Osteoarthritis   . Osteopenia   . Upper GI bleed September2004   secondary to gastritis    Patient Active Problem List   Diagnosis Date Noted  . Palliative care by specialist   . Advanced care planning/counseling discussion   . Goals of care, counseling/discussion   . Disorientation   . AKI (acute kidney injury) ([place]) 09/13/2018  . Hyponatremia 09/13/2018  . Herpes zoster ophthalmicus 09/03/2018  . Right acetabular fracture ([place]) 09/03/2018  . Syncope and collapse 09/03/2018  . Protein-calorie malnutrition, severe 09/03/2018  . Vaginal atrophy 04/29/2018    . Angiokeratoma 04/29/2018  . PMB (postmenopausal bleeding) 04/29/2018  . Acute on chronic diastolic CHF (congestive heart failure) ([place]) 10/23/2016  . Acute respiratory failure with hypoxia ([place]) 10/23/2016  . Nephrolithiasis 09/27/2016  . Hydronephrosis with renal and ureteral calculus obstruction 09/27/2016  . Hydronephrosis 09/27/2016  . Heme positive stool 09/05/2016  . Dyslipidemia 03/07/2015  . Type 2 diabetes mellitus with vascular disease ([place]) 03/07/2015  . Anemia 09/26/2014  . Lesion of lip 09/25/2014  . S/P CABG x 3 08/25/2014  . NSTEMI (non-ST elevated myocardial infarction) ([place]) 08/24/2014  . Acute coronary syndrome ([place])   . IBS (irritable bowel syndrome) 08/26/2012  . Thrombocytopenia ([place]) 02/02/2012  . Epigastric pain 02/02/2012  . Gastritis 02/02/2012  . Abdominal pain 12/17/2011  . Normocytic anemia 12/17/2011  . History of colon polyps 12/17/2011  . Constipation 12/17/2011  . Family history of colon cancer 08/22/2011  . LLQ pain 08/21/2011  . Chronic pancreatitis ([place]) 09/12/2008  . HELICOBACTER PYLORI INFECTION 01/26/2008  . ADENOCARCINOMA, BREAST 01/26/2008  . Essential hypertension 01/26/2008  . External hemorrhoids 01/26/2008  . GERD 01/26/2008  . PSEUDOCYST, PANCREAS 01/26/2008  . GALLSTONE PANCREATITIS 01/26/2008  . GI BLEEDING 01/26/2008  . UTI (urinary tract infection) 01/26/2008  . NAUSEA 01/26/2008  . VOMITING 01/26/2008  . DIARRHEA 01/26/2008    Past Surgical History:  Procedure Laterality Date  . CARDIAC CATHETERIZATION N/A  08/24/2014   Procedure: Left Heart Cath and Coronary Angiography;  Surgeon: Leonie Man, MD;  Location: [place] [facility];  Service: Cardiovascular;  Laterality: N/A;  . COLONOSCOPY  08/15/05   few tiny diverticula at sigmoid colon/external hemorrhoids but no polyps  . COLONOSCOPY  01/08/2012   QRF:XJOITGP diverticulosis. Next colonoscopy in 12/2016  . CORONARY ARTERY BYPASS GRAFT N/A 08/24/2014   Procedure:  CORONARY ARTERY BYPASS GRAFTING (CABG) x three, using left internal mammary artery and right leg greater saphenous vein harvested endoscopically;  Surgeon: Ivin Poot, MD;  Location: [place];  Service: Open Heart Surgery;  Laterality: N/A;  . ECTOPIC PREGNANCY SURGERY  1950's  . ERCP with sphincterotomy  07/2002  . ESOPHAGOGASTRODUODENOSCOPY      Gastritis of body.  Otherwise normal  . ESOPHAGOGASTRODUODENOSCOPY  01/08/2012   RMR: Few scattered gastric erosions of uncertain significance-status post biopsy. Minimal chronic inflammation, no H.pylori  . LAPAROSCOPIC CHOLECYSTECTOMY    . PANCREATIC PSEUDOCYST DRAINAGE  [id number]   drained percutaneously   . right mastectomy    . tacking up of her bladder    . TEE WITHOUT CARDIOVERSION N/A 08/24/2014   Procedure: TRANSESOPHAGEAL ECHOCARDIOGRAM (TEE);  Surgeon: Ivin Poot, MD;  Location: [place];  Service: Open Heart Surgery;  Laterality: N/A;     OB History    Gravida  6   Para  5   Term  5   Preterm      AB  1   Living  5     SAB      TAB      Ectopic  1   Multiple      Live Births  5           Family History  Problem Relation Age of Onset  . Ovarian cancer Mother        passed  . Colon cancer Father 65       passed away in his 76's  . Colon cancer Brother 41       surgery inhis 50's doing well now  . Heart attack Brother        passed away age 15  . Pancreatitis Brother   . Kidney disease Daughter   . Leukemia Son     Social History   Tobacco Use  . Smoking status: Never Smoker  . Smokeless tobacco: Never Used  Vaping Use  . Vaping Use: Never used  Substance Use Topics  . Alcohol use: No    Alcohol/week: 0.0 standard drinks  . Drug use: No    Home Medications Prior to Admission medications   Medication Sig Start Date End Date Taking? Authorizing Provider  amLODipine (NORVASC) 5 MG tablet Take 1 tablet by mouth once daily 09/21/19   Arnoldo Lenis, MD  aspirin EC 81 MG EC tablet Take 1  tablet (81 mg total) by mouth daily. 08/31/14   Gold, Wilder Glade, PA-C  Blood Glucose Monitoring Suppl (ONE TOUCH ULTRA 2) w/Device KIT Use as directed to monitor FSBS 3x daily. Dx: E11.9. 07/13/18   Susy Frizzle, MD  brimonidine-timolol (COMBIGAN) 0.2-0.5 % ophthalmic solution Place 1 drop into both eyes every 12 (twelve) hours.    [provider]  CREON 24000-76000 units CPEP TAKE 2 CAPSULES BY MOUTH WITH MEALS AND 1 CAP WITH SNACKS x 2 Patient taking differently: Take 1-2 capsules by mouth 5 (five) times daily. Patient takes 2 capsules with meals and 1 capsule with snacks 05/19/18  Susy Frizzle, MD  cycloSPORINE (RESTASIS) 0.05 % ophthalmic emulsion Place 1 drop into both eyes 3 (three) times daily as needed.     [provider]  diclofenac (VOLTAREN) 75 MG EC tablet Take 1 tablet (75 mg total) by mouth 2 (two) times daily. Use as needed for knee pain 04/18/19   Susy Frizzle, MD  EQ ALLERGY RELIEF, CETIRIZINE, 10 MG tablet Take 1 tablet by mouth once daily 08/11/19   Susy Frizzle, MD  feeding supplement, ENSURE ENLIVE, (ENSURE ENLIVE) LIQD Take 237 mLs by mouth 2 (two) times daily between meals. 09/07/18   Lavina Hamman, MD  ferrous sulfate 325 (65 FE) MG tablet Take 325 mg by mouth daily with breakfast.    [provider]  FLUoxetine (PROZAC) 20 MG tablet Take 1 tablet by mouth once daily 06/16/19   Susy Frizzle, MD  furosemide (LASIX) 20 MG tablet Take 1 tablet by mouth once daily 06/30/19   Susy Frizzle, MD  gabapentin (NEURONTIN) 100 MG capsule TAKE 1 CAPSULE BY MOUTH THREE TIMES DAILY AS NEEDED FOR  NERVE  PAIN 08/11/19   Susy Frizzle, MD  glucose blood (ONE TOUCH ULTRA TEST) test strip Use as directed to monitor FSBS 5 x daily. Dx: E11.9. 08/18/18   Susy Frizzle, MD  Insulin Syringes, Disposable, U-100 1 ML MISC 1/2 inch 31 g 05/11/17   Susy Frizzle, MD  lisinopril (ZESTRIL) 2.5 MG tablet Take 1 tablet by mouth once daily 05/17/19    Arnoldo Lenis, MD  LORazepam (ATIVAN) 0.5 MG tablet TAKE 1 TO 2 TABLETS BY MOUTH AT BEDTIME AS NEEDED FOR INSOMNIA 03/07/19   Susy Frizzle, MD  LORazepam (ATIVAN) 1 MG tablet TAKE 1/2 TO 1 (ONE-HALF TO ONE) TABLET BY MOUTH TWICE DAILY AS NEEDED 09/16/19   Alycia Rossetti, MD  magnesium oxide (MAG-OX) 400 (241.3 Mg) MG tablet Take 1 tablet by mouth once daily 09/21/19   Arnoldo Lenis, MD  Magnesium Oxide 400 (240 Mg) MG TABS TAKE 1 TABLET BY MOUTH ONCE DAILY 03/24/18   Arnoldo Lenis, MD  metFORMIN (GLUCOPHAGE) 1000 MG tablet Take 500 mg by mouth 2 (two) times daily with a meal.    [provider]  metoprolol tartrate (LOPRESSOR) 25 MG tablet Take 1 tablet by mouth twice daily 08/11/19   Susy Frizzle, MD  Multiple Vitamin (MULTIVITAMIN) capsule Take 1 capsule by mouth daily.    [provider]  neomycin-polymyxin b-dexamethasone (MAXITROL) 3.5-10000-0.1 OINT APPLY OINTMENT INTO LEFT EYE THREE TIMES DAILY AS DIRECTED 09/02/18   [provider]  NOVOLIN 70/30 RELION (70-30) 100 UNIT/ML injection INJECT 25 UNITS SUBCUTANEOUSLY IN THE MORNING AND 15 IN THE EVENING 05/20/19   Susy Frizzle, MD  OneTouch Delica Lancets 74Y MISC Use as directed to monitor checks BS QID-5 x's a day. Dx: E11.9. 11/03/18   Susy Frizzle, MD  pantoprazole (PROTONIX) 40 MG tablet Take 1 tablet by mouth once daily 05/17/19   Arnoldo Lenis, MD  polyethylene glycol (MIRALAX / GLYCOLAX) 17 g packet Take 17 g by mouth daily. 09/15/18   Manuella Ghazi, Pratik D, DO  potassium chloride (KLOR-CON) 10 MEQ tablet TAKE 1 TABLET BY MOUTH ONCE DAILY AS NEEDED. TAKE WITH FUROSEMIDE. 04/06/19   Susy Frizzle, MD  RELION INSULIN SYR 0.5ML/31G 31G X 5/16" 0.5 ML MISC USE AS DIRECTED 03/28/19   Susy Frizzle, MD  rosuvastatin (CRESTOR) 5 MG tablet Take 1 tablet  by mouth once daily 09/21/19   Arnoldo Lenis, MD  triamcinolone cream (KENALOG) 0.1 % APPLY  CREAM EXTERNALLY TWICE DAILY Patient taking  differently: as needed.  03/02/17   Susy Frizzle, MD  Wheat Dextrin (BENEFIBER DRINK MIX) PACK Take 1 Package by mouth daily.    [provider]    Allergies    Calcium-containing compounds, Morphine and related, Raloxifene, and Vitamin d analogs  Review of Systems   Review of Systems Level 5 caveat because patient is nonverbal. Physical Exam Updated Vital Signs Ht '5\' 4"'  (1.626 m)   Wt 68 kg   BMI 25.75 kg/m   Physical Exam Vitals and nursing note reviewed.  Constitutional:      General: She is in acute distress.     Appearance: She is well-developed. She is toxic-appearing.  HENT:     Head: Normocephalic and atraumatic.  Eyes:     General:        Right eye: No discharge.        Left eye: No discharge.     Conjunctiva/sclera: Conjunctivae normal.  Cardiovascular:     Rate and Rhythm: Regular rhythm.     Heart sounds: Normal heart sounds. No murmur heard.  No friction rub. No gallop.   Pulmonary:     Effort: Respiratory distress present.     Breath sounds: Rales present.  Abdominal:     General: There is no distension.     Palpations: Abdomen is soft.     Tenderness: There is no abdominal tenderness.  Musculoskeletal:        General: No tenderness.     Cervical back: Neck supple.     Right lower leg: Edema present.     Left lower leg: Edema present.     Comments: Mild symmetric lower extremity edema  Skin:    General: Skin is warm and dry.  Psychiatric:        Behavior: Behavior normal.        Thought Content: Thought content normal.     ED Results / Procedures / Treatments   Labs (all labs ordered are listed, but only abnormal results are displayed) Labs Reviewed  CBC WITH DIFFERENTIAL/PLATELET - Abnormal; Notable for the following components:      Result Value   WBC 12.7 (*)    Lymphs Abs 5.7 (*)    All other components within normal limits  BASIC METABOLIC PANEL - Abnormal; Notable for the following components:   Sodium 129 (*)     Chloride 95 (*)    CO2 19 (*)    Glucose, Bld 258 (*)    Creatinine, Ser 1.17 (*)    Calcium 8.7 (*)    GFR calc non Af Amer 41 (*)    GFR calc Af Amer 48 (*)    All other components within normal limits  BRAIN NATRIURETIC PEPTIDE - Abnormal; Notable for the following components:   B Natriuretic Peptide 719.0 (*)    All other components within normal limits  BASIC METABOLIC PANEL - Abnormal; Notable for the following components:   Sodium 131 (*)    Chloride 93 (*)    Glucose, Bld 169 (*)    Creatinine, Ser 1.07 (*)    GFR calc non Af Amer 46 (*)    GFR calc Af Amer 53 (*)    All other components within normal limits  GLUCOSE, CAPILLARY - Abnormal; Notable for the following components:   Glucose-Capillary 161 (*)    All  other components within normal limits  I-STAT CHEM 8, ED - Abnormal; Notable for the following components:   Sodium 130 (*)    Chloride 96 (*)    Creatinine, Ser 1.20 (*)    Glucose, Bld 261 (*)    All other components within normal limits  CBG MONITORING, ED - Abnormal; Notable for the following components:   Glucose-Capillary 167 (*)    All other components within normal limits  SARS CORONAVIRUS 2 BY RT PCR (HOSPITAL ORDER, [facility])  URINE CULTURE  MRSA PCR SCREENING  MAGNESIUM  TROPONIN I (HIGH SENSITIVITY)    EKG EKG Interpretation  Date/Time:  Friday September 23 2019 13:52:12 EDT Ventricular Rate:  87 PR Interval:    QRS Duration: 99 QT Interval:  374 QTC Calculation: 450 R Axis:   12 Text Interpretation: Sinus rhythm Ventricular premature complex Anteroseptal infarct, old Nonspecific repol abnormality, diffuse leads Confirmed by Virgel Manifold [phone]) on 09/23/2019 2:21:24 PM   Radiology DG Chest Portable 1 View  Result Date: 09/23/2019 CLINICAL DATA:  Hypoxic.  COVID test disorder. EXAM: PORTABLE CHEST 1 VIEW COMPARISON:  September 13, 2018 FINDINGS: The mediastinal contour and cardiac silhouette are normal. Increased  pulmonary interstitium is identified bilaterally. Minimal left pleural effusion is identified. Surgical clips are projected over the right chest and right axilla. IMPRESSION: Increased pulmonary interstitium is identified bilaterally. This can be seen in viral infection or interstitial edema. Minimal left pleural effusion. Electronically Signed   By: Abelardo Diesel M.D.   On: 09/23/2019 14:48    Procedures Procedures (including critical care time)  CRITICAL CARE Performed by: Virgel Manifold Total critical care time: 35 minutes Critical care time was exclusive of separately billable procedures and treating other patients. Critical care was necessary to treat or prevent imminent or life-threatening deterioration. Critical care was time spent personally by me on the following activities: development of treatment plan with patient and/or surrogate as well as nursing, discussions with consultants, evaluation of patient's response to treatment, examination of patient, obtaining history from patient or surrogate, ordering and performing treatments and interventions, ordering and review of laboratory studies, ordering and review of radiographic studies, pulse oximetry and re-evaluation of patient's condition.   Medications Ordered in ED Medications  nitroGLYCERIN 50 mg in dextrose 5 % 250 mL (0.2 mg/mL) infusion (0 mcg/min Intravenous Stopped 09/24/19 0135)  aspirin EC tablet 81 mg (has no administration in time range)  amLODipine (NORVASC) tablet 5 mg (has no administration in time range)  lisinopril (ZESTRIL) tablet 2.5 mg (has no administration in time range)  metoprolol tartrate (LOPRESSOR) tablet 25 mg (25 mg Oral Given 09/23/19 2357)  rosuvastatin (CRESTOR) tablet 5 mg (5 mg Oral Given 09/23/19 2356)  FLUoxetine (PROZAC) capsule 20 mg (20 mg Oral Given 09/23/19 2357)  LORazepam (ATIVAN) tablet 0.5 mg (has no administration in time range)  lipase/protease/amylase (CREON) capsule 24,000 Units (24,000  Units Oral Given 09/24/19 4431)  pantoprazole (PROTONIX) EC tablet 40 mg (has no administration in time range)  ferrous sulfate tablet 325 mg (has no administration in time range)  gabapentin (NEURONTIN) capsule 100 mg (100 mg Oral Given 09/23/19 2357)  cycloSPORINE (RESTASIS) 0.05 % ophthalmic emulsion 1 drop (has no administration in time range)  sodium chloride flush (NS) 0.9 % injection 3 mL (3 mLs Intravenous Not Given 09/23/19 2358)  sodium chloride flush (NS) 0.9 % injection 3 mL (has no administration in time range)  0.9 %  sodium chloride infusion (has no administration in  time range)  acetaminophen (TYLENOL) tablet 650 mg (650 mg Oral Given 09/23/19 2356)  ondansetron (ZOFRAN) injection 4 mg (has no administration in time range)  heparin injection 5,000 Units (5,000 Units Subcutaneous Not Given 09/24/19 1115)  hydrALAZINE (APRESOLINE) injection 10 mg (has no administration in time range)  furosemide (LASIX) injection 40 mg (40 mg Intravenous Given 09/23/19 1742)  insulin aspart (novoLOG) injection 0-9 Units (2 Units Subcutaneous Given 09/23/19 1742)  brimonidine (ALPHAGAN) 0.2 % ophthalmic solution 1 drop (1 drop Both Eyes Not Given 09/23/19 2357)    And  timolol (TIMOPTIC) 0.5 % ophthalmic solution 1 drop (1 drop Both Eyes Not Given 09/23/19 2357)  MEDLINE mouth rinse (15 mLs Mouth Rinse Given 09/23/19 2358)  Chlorhexidine Gluconate Cloth 2 % PADS 6 each (has no administration in time range)  morphine 2 MG/ML injection 2 mg (2 mg Intravenous Given 09/23/19 1405)  furosemide (LASIX) injection 80 mg (80 mg Intravenous Given 09/23/19 1406)    ED Course  I have reviewed the triage vital signs and the nursing notes.  Pertinent labs & imaging results that were available during my care of the patient were reviewed by me and considered in my medical decision making (see chart for details).    MDM Rules/Calculators/A&P                         86 year old female with respiratory failure.  Suspect flash  pulmonary edema.  Arrived in extremis.  She thankfully responded very well to BiPAP.  Initial blood pressures in the 520E to 022 systolic.  She was started on nitroglycerin drip.  By the time of admission she was much more alert, answering questions verbally and following commands.  Work of breathing has has significantly decreased and oxygenation has improved.  Will need admission for ongoing treatment.    Final Clinical Impression(s) / ED Diagnoses Final diagnoses:  Acute respiratory distress  Acute pulmonary edema [facility])    Rx / DC Orders ED Discharge Orders    None       Virgel Manifold, MD 09/24/19 [phone]

## 2019-09-23 NOTE — H&P (Signed)
TRH H&P   Patient Demographics:    Misty Edwards, is a 86 y.o. female  MRN: [id number]   DOB - 01-03-1927  Admit Date - 09/23/2019  Outpatient Primary MD for the patient is Dennard Schaumann Cammie Mcgee, MD  Referring MD/NP/PA: Dr Wilson Singer  Patient coming from: Home  Chief Complaint  Patient presents with  . Shortness of Breath      HPI:    Misty Edwards  is a 86 y.o. female, with past medical history of type 2 diabetes mellitus, insulin-dependent, CAD status post CABG in 2016, hypertension, hyperlipidemia, patient presents to ED secondary to shortness of breath, patient currently on BiPAP, it is difficult to obtain history from patient, she is able sometimes to answer some yes and no questions , grandson at bed side assist with the history, patient at her usual state of health this morning, she ate her breakfast with no acute issues, she took her medications, but apparently she has been out of her Lasix medication for last couple days, as she ran out of them, and she was awaiting for refills, her blood pressure medicine this morning, she started to to have dyspnea, rapidly progressive, as any cough, fever, chills, or chest pain, EMS were called, reportedly she had hypoxia in the 30s upon the presentation where she had put on CPAP, she was saturating 82% on presentation on CPAP, which she has been transitioned to BiPAP, patient reports significant improvement of her symptoms after receiving Lasix, nitro drip and morphine. - in ED patient hypoxic 82% on CPAP, chest x-ray significant for interstitial findings chest for nontypical pneumonia versus volume overload (she finished both of her Covid vaccine 2 months ago close (, she denies fever, chills or cough, Significant for sodium of 129, creatinine of 1.17 which is around her baseline, BNP was elevated at 719, high-sensitivity troponin within normal limit at  6, and received IV Lasix, started on nitro drip for systolic blood pressure of 190, and he with IV morphine, [facility]  hospitalist consulted to admit.    Review of systems:    In addition to the HPI above,  No Fever-chills, No Headache, No changes with Vision or hearing, No problems swallowing food or Liquids, No Chest pain, Cough does report shortness of breath. No Abdominal pain, No Nausea or Vommitting, Bowel movements are regular, No Blood in stool or Urine, No dysuria, No new skin rashes or bruises, No new joints pains-aches,  No new weakness, tingling, numbness in any extremity, No recent weight gain or loss, No polyuria, polydypsia or polyphagia, No significant Mental Stressors.  A full 10 point Review of Systems was done, except as stated above, all other Review of Systems were negative.   With Past History of the following :    Past Medical History:  Diagnosis Date  . Acute biliary pancreatitis 07/2002   thia was in 07/2004:she still has pseudocyst in tail  of pancreas measuring 54 x 33 mm   . Adrenal adenoma    bilateral  . Anxiety   . Chronic pancreatitis ([place])   . Detached retina   . Diabetes mellitus ([place])   . Diverticulosis   . History of carcinoma in situ of breast 1988  . Hyperlipidemia   . Hypertension   . IBS (irritable bowel syndrome)   . Legally blind   . Osteoarthritis   . Osteopenia   . Upper GI bleed September2004   secondary to gastritis      Past Surgical History:  Procedure Laterality Date  . CARDIAC CATHETERIZATION N/A 08/24/2014   Procedure: Left Heart Cath and Coronary Angiography;  Surgeon: Leonie Man, MD;  Location: [facility];  Service: Cardiovascular;  Laterality: N/A;  . COLONOSCOPY  08/15/05   few tiny diverticula at sigmoid colon/external hemorrhoids but no polyps  . COLONOSCOPY  01/08/2012   WVP:XTGGYIR diverticulosis. Next colonoscopy in 12/2016  . CORONARY ARTERY BYPASS GRAFT N/A 08/24/2014   Procedure: CORONARY ARTERY  BYPASS GRAFTING (CABG) x three, using left internal mammary artery and right leg greater saphenous vein harvested endoscopically;  Surgeon: Ivin Poot, MD;  Location: [facility];  Service: Open Heart Surgery;  Laterality: N/A;  . ECTOPIC PREGNANCY SURGERY  1950's  . ERCP with sphincterotomy  07/2002  . ESOPHAGOGASTRODUODENOSCOPY      Gastritis of body.  Otherwise normal  . ESOPHAGOGASTRODUODENOSCOPY  01/08/2012   RMR: Few scattered gastric erosions of uncertain significance-status post biopsy. Minimal chronic inflammation, no H.pylori  . LAPAROSCOPIC CHOLECYSTECTOMY    . PANCREATIC PSEUDOCYST DRAINAGE  [id number]   drained percutaneously   . right mastectomy    . tacking up of her bladder    . TEE WITHOUT CARDIOVERSION N/A 08/24/2014   Procedure: TRANSESOPHAGEAL ECHOCARDIOGRAM (TEE);  Surgeon: Ivin Poot, MD;  Location: [place] [facility];  Service: Open Heart Surgery;  Laterality: N/A;      Social History:     Social History   Tobacco Use  . Smoking status: Never Smoker  . Smokeless tobacco: Never Used  Substance Use Topics  . Alcohol use: No    Alcohol/week: 0.0 standard drinks       Family History :     Family History  Problem Relation Age of Onset  . Ovarian cancer Mother        passed  . Colon cancer Father 25       passed away in his 30's  . Colon cancer Brother 31       surgery inhis 50's doing well now  . Heart attack Brother        passed away age 42  . Pancreatitis Brother   . Kidney disease Daughter   . Leukemia Son       Home Medications:   Prior to Admission medications   Medication Sig Start Date End Date Taking? Authorizing Provider  amLODipine (NORVASC) 5 MG tablet Take 1 tablet by mouth once daily Patient taking differently: Take 5 mg by mouth at bedtime.  09/21/19  Yes BranchAlphonse Guild, MD  aspirin EC 81 MG EC tablet Take 1 tablet (81 mg total) by mouth daily. Patient taking differently: Take 81 mg by mouth every morning.  08/31/14  Yes Gold, Wayne E, PA-C   brimonidine-timolol (COMBIGAN) 0.2-0.5 % ophthalmic solution Place 1 drop into both eyes every 12 (twelve) hours as needed.    Yes [provider]  calcium carbonate (TUMS - DOSED IN MG ELEMENTAL  CALCIUM) 500 MG chewable tablet Chew 3 tablets by mouth at bedtime.   Yes [provider]  CREON 24000-76000 units CPEP TAKE 2 CAPSULES BY MOUTH WITH MEALS AND 1 CAP WITH SNACKS x 2 Patient taking differently: Take 1-2 capsules by mouth 5 (five) times daily. Patient takes 2 capsules with meals and 1 capsule with snacks 05/19/18  Yes Susy Frizzle, MD  cycloSPORINE (RESTASIS) 0.05 % ophthalmic emulsion Place 1 drop into both eyes 3 (three) times daily as needed.    Yes [provider]  EQ ALLERGY RELIEF, CETIRIZINE, 10 MG tablet Take 1 tablet by mouth once daily Patient taking differently: Take 10 mg by mouth at bedtime.  08/11/19  Yes Susy Frizzle, MD  feeding supplement, ENSURE ENLIVE, (ENSURE ENLIVE) LIQD Take 237 mLs by mouth 2 (two) times daily between meals. 09/07/18  Yes Lavina Hamman, MD  ferrous sulfate 325 (65 FE) MG tablet Take 325 mg by mouth daily with breakfast.   Yes [provider]  FLUoxetine (PROZAC) 20 MG tablet Take 1 tablet by mouth once daily Patient taking differently: Take 20 mg by mouth at bedtime.  06/16/19  Yes Susy Frizzle, MD  furosemide (LASIX) 20 MG tablet Take 1 tablet by mouth once daily Patient taking differently: Take 20 mg by mouth daily.  06/30/19  Yes Susy Frizzle, MD  gabapentin (NEURONTIN) 100 MG capsule TAKE 1 CAPSULE BY MOUTH THREE TIMES DAILY AS NEEDED FOR  NERVE  PAIN Patient taking differently: Take 100 mg by mouth at bedtime.  08/11/19  Yes Susy Frizzle, MD  lisinopril (ZESTRIL) 2.5 MG tablet Take 1 tablet by mouth once daily Patient taking differently: Take 2.5 mg by mouth every morning.  05/17/19  Yes Branch, Alphonse Guild, MD  LORazepam (ATIVAN) 1 MG tablet TAKE 1/2 TO 1 (ONE-HALF TO ONE) TABLET BY MOUTH TWICE  DAILY AS NEEDED Patient taking differently: Take 1 mg by mouth at bedtime.  09/16/19  Yes [place], Modena Nunnery, MD  magnesium oxide (MAG-OX) 400 (241.3 Mg) MG tablet Take 1 tablet by mouth once daily Patient taking differently: Take 400 mg by mouth daily.  09/21/19  Yes BranchAlphonse Guild, MD  metFORMIN (GLUCOPHAGE) 1000 MG tablet Take 500 mg by mouth daily as needed (for high blood sugar levels).    Yes [provider]  metoprolol tartrate (LOPRESSOR) 25 MG tablet Take 1 tablet by mouth twice daily Patient taking differently: Take 25 mg by mouth 2 (two) times daily.  08/11/19  Yes Susy Frizzle, MD  Multiple Vitamin (MULTIVITAMIN) capsule Take 1 capsule by mouth every morning.    Yes [provider]  NOVOLIN 70/30 RELION (70-30) 100 UNIT/ML injection INJECT 25 UNITS SUBCUTANEOUSLY IN THE MORNING AND 15 IN THE EVENING Patient taking differently: Inject 15-25 Units into the skin See admin instructions. 25u in the morning and 15u at bedtime 05/20/19  Yes Susy Frizzle, MD  pantoprazole (PROTONIX) 40 MG tablet Take 1 tablet by mouth once daily Patient taking differently: Take 40 mg by mouth in the morning.  05/17/19  Yes Branch, Alphonse Guild, MD  polyethylene glycol (MIRALAX / GLYCOLAX) 17 g packet Take 17 g by mouth daily. Patient taking differently: Take 17 g by mouth daily as needed for mild constipation or moderate constipation.  09/15/18  Yes Shah, Pratik D, DO  potassium chloride (KLOR-CON) 10 MEQ tablet TAKE 1 TABLET BY MOUTH ONCE DAILY AS NEEDED. TAKE WITH FUROSEMIDE. Patient taking differently: Take 10 mEq  by mouth daily.  04/06/19  Yes Susy Frizzle, MD  rosuvastatin (CRESTOR) 5 MG tablet Take 1 tablet by mouth once daily Patient taking differently: Take 5 mg by mouth at bedtime.  09/21/19  Yes Branch, Alphonse Guild, MD  triamcinolone cream (KENALOG) 0.1 % APPLY  CREAM EXTERNALLY TWICE DAILY Patient taking differently: Apply 1 application topically as needed.  03/02/17  Yes Susy Frizzle, MD  Wheat Dextrin (BENEFIBER DRINK MIX) PACK Take 1 Package by mouth daily as needed (for supplement-digestion).    Yes [provider]     Allergies:     Allergies  Allergen Reactions  . Calcium-Containing Compounds Nausea Only  . Morphine And Related Other (See Comments)    Hallucination  . Raloxifene Itching    Evista- Face and eyes burning  . Vitamin D Analogs Nausea Only     Physical Exam:   Vitals  Blood pressure (!) 121/55, pulse (!) 57, resp. rate (!) 24, height 5\' 4"  (1.626 m), weight 68 kg, SpO2 99 %.   1. General [place] frail, elderly female, laying in bed on BiPAP  2.  Appears awake alert x3, negative, but difficult to understand through her BiPAP  3. No F.N deficits, ALL C.Nerves Intact, Strength 5/5 all 4 extremities, Sensation intact all 4 extremities, Plantars down going.  4. Ears and Eyes appear Normal, Conjunctivae clear, PERRLA. Moist Oral Mucosa.  5. Supple Neck, No JVD, No cervical lymphadenopathy appriciated, No Carotid Bruits.  He has trace edema bilaterally  6. Symmetrical Chest wall movement, Good air movement bilaterally, has diffuse crackles in the lower two thirds of her lungs  7. RRR, No Gallops, Rubs or Murmurs, No Parasternal Heave.  8. Positive Bowel Sounds, Abdomen Soft, No tenderness, No organomegaly appriciated,No rebound -guarding or rigidity.  9.  No Cyanosis, Normal Skin Turgor, No Skin Rash or Bruise.  10. Good muscle tone,  joints appear normal , no effusions, Normal ROM.  11. No Palpable Lymph Nodes in Neck or Axillae   Data Review:    CBC Recent Labs  Lab 09/23/19 1354 09/23/19 1356  WBC 12.7*  --   HGB 12.3 12.9  HCT 38.7 38.0  PLT 204  --   MCV 90.4  --   MCH 28.7  --   MCHC 31.8  --   RDW 13.6  --   LYMPHSABS 5.7*  --   MONOABS 0.7  --   EOSABS 0.2  --   BASOSABS 0.1  --     ------------------------------------------------------------------------------------------------------------------  Chemistries  Recent Labs  Lab 09/23/19 1354 09/23/19 1356  NA 129* 130*  K 4.5 4.9  CL 95* 96*  CO2 19*  --   GLUCOSE 258* 261*  BUN 20 22  CREATININE 1.17* 1.20*  CALCIUM 8.7*  --   MG 2.0  --    ------------------------------------------------------------------------------------------------------------------ estimated creatinine clearance is 29.5 mL/min (A) (by C-G formula based on SCr of 1.2 mg/dL (H)). ------------------------------------------------------------------------------------------------------------------ No results for input(s): TSH, T4TOTAL, T3FREE, THYROIDAB in the last 72 hours.  Invalid input(s): FREET3  Coagulation profile No results for input(s): INR, PROTIME in the last 168 hours. ------------------------------------------------------------------------------------------------------------------- No results for input(s): DDIMER in the last 72 hours. -------------------------------------------------------------------------------------------------------------------  Cardiac Enzymes No results for input(s): CKMB, TROPONINI, MYOGLOBIN in the last 168 hours.  Invalid input(s): CK ------------------------------------------------------------------------------------------------------------------    Component Value Date/Time   BNP 719.0 (H) 09/23/2019 1354     ---------------------------------------------------------------------------------------------------------------  Urinalysis    Component Value Date/Time   COLORURINE YELLOW 11/01/2018  [place] 11/01/2018 1231   LABSPEC 1.020 11/01/2018 1231   PHURINE 6.0 11/01/2018 1231   GLUCOSEU NEGATIVE 11/01/2018 1231   HGBUR NEGATIVE 11/01/2018 [place] 09/13/2018 [place] 11/01/2018 1231   PROTEINUR NEGATIVE 11/01/2018 1231    UROBILINOGEN 0.2 09/02/2014 1445   NITRITE NEGATIVE 11/01/2018 1231   LEUKOCYTESUR TRACE (A) 11/01/2018 1231    ----------------------------------------------------------------------------------------------------------------   Imaging Results:    DG Chest Portable 1 View  Result Date: 09/23/2019 CLINICAL DATA:  Hypoxic.  COVID test disorder. EXAM: PORTABLE CHEST 1 VIEW COMPARISON:  September 13, 2018 FINDINGS: The mediastinal contour and cardiac silhouette are normal. Increased pulmonary interstitium is identified bilaterally. Minimal left pleural effusion is identified. Surgical clips are projected over the right chest and right axilla. IMPRESSION: Increased pulmonary interstitium is identified bilaterally. This can be seen in viral infection or interstitial edema. Minimal left pleural effusion. Electronically Signed   By: Abelardo Diesel M.D.   On: 09/23/2019 14:48    My personal review of EKG: Rhythm NSR, Rate  87 /min, QTc 450   Assessment & Plan:    Active Problems:   Essential hypertension   Chronic pancreatitis (HCC)   Dyslipidemia   Acute on chronic diastolic CHF (congestive heart failure) (HCC)   Acute respiratory failure with hypoxia (HCC)   Acute respiratory failure (HCC)    Acute hypoxic respiratory failure -This is secondary to volume overload, the setting of acute on chronic diastolic CHF, flash pulmonary edema/hypertensive urgency -She is currently on BiPAP, but appears to be comfortable after receiving Lasix, nitro drip and morphine, try to transition to oxygen, but meanwhile she will be admitted to ICU. -Continue with IV diuresis, and blood pressure control, please see discussion below.  Acute on chronic diastolic CHF/pulmonary edema -This is most likely in setting of hypertensive urgency, and she is being out of her Lasix medication for last 2 days . -Is admitted under CHF pathway, will continue with daily weight, strict ins and out, will continue with IV diuresis, she  received 80 mg of IV Lasix in ED, will continue with 40 mg of IV daily. -Monitor renal function closely.  Hypertension/hypertensive urgency -Contributing to her pulmonary edema and respiratory failure, continue with IV nitro for now, will resume on her home medications, and will add as needed hydralazine she is off nitro drip. -Resume home medications.  Urinary retention -Having 400 cc in her bladder after receiving IV Lasix, will have in and out.  Continue bladder scan monitoring every 8 hours.  Chronic pancreatitis -Continue with home medications  Type 2 diabetes mellitus -We will keep on insulin sliding scale during hospital stay  CAD -Status post CABG, continue with aspirin.  She denies any chest pain, EKG nonacute, high-sensitivity troponin within normal limit.  Hyperlipidemia -Continue with home dose statin     DVT Prophylaxis Heparin   AM Labs Ordered, also please review Full Orders  Family Communication: Admission, patients condition and plan of care including tests being ordered have been discussed with the patient and Grandson at [facility] indicate understanding and agree with the plan and Code Status.  Code Status Full  Likely DC to  Home  Condition GUARDED   Consults called: None  Admission status: Inpatient  Time spent in minutes : 60 minutes   Phillips Climes M.D on 09/23/2019 at 4:40 PM   [facility] - Office  [phone]

## 2019-09-24 DIAGNOSIS — E785 Hyperlipidemia, unspecified: Secondary | ICD-10-CM

## 2019-09-24 DIAGNOSIS — J810 Acute pulmonary edema: Secondary | ICD-10-CM

## 2019-09-24 DIAGNOSIS — E1121 Type 2 diabetes mellitus with diabetic nephropathy: Secondary | ICD-10-CM

## 2019-09-24 DIAGNOSIS — N179 Acute kidney failure, unspecified: Secondary | ICD-10-CM

## 2019-09-24 DIAGNOSIS — N1832 Chronic kidney disease, stage 3b: Secondary | ICD-10-CM

## 2019-09-24 LAB — BASIC METABOLIC PANEL
Anion gap: 12 (ref 5–15)
BUN: 23 mg/dL (ref 8–23)
CO2: 26 mmol/L (ref 22–32)
Calcium: 8.9 mg/dL (ref 8.9–10.3)
Chloride: 93 mmol/L — ABNORMAL LOW (ref 98–111)
Creatinine, Ser: 1.07 mg/dL — ABNORMAL HIGH (ref 0.44–1.00)
GFR calc Af Amer: 53 mL/min — ABNORMAL LOW (ref >60)
GFR calc non Af Amer: 46 mL/min — ABNORMAL LOW (ref >60)
Glucose, Bld: 169 mg/dL — ABNORMAL HIGH (ref 70–99)
Potassium: 4.2 mmol/L (ref 3.5–5.1)
Sodium: 131 mmol/L — ABNORMAL LOW (ref 135–145)

## 2019-09-24 LAB — MRSA PCR SCREENING: MRSA by PCR: NEGATIVE

## 2019-09-24 LAB — GLUCOSE, CAPILLARY
Glucose-Capillary: 179 mg/dL — ABNORMAL HIGH (ref 70–99)
Glucose-Capillary: 203 mg/dL — ABNORMAL HIGH (ref 70–99)
Glucose-Capillary: 210 mg/dL — ABNORMAL HIGH (ref 70–99)
Glucose-Capillary: 233 mg/dL — ABNORMAL HIGH (ref 70–99)

## 2019-09-24 MED ORDER — CHLORHEXIDINE GLUCONATE CLOTH 2 % EX PADS
6.0000 | MEDICATED_PAD | Freq: Every day | CUTANEOUS | Status: DC
  Administered 2019-09-24 – 2019-09-25 (×2): 6 via TOPICAL

## 2019-09-24 MED ORDER — FUROSEMIDE 10 MG/ML IJ SOLN
40.0000 mg | Freq: Two times a day (BID) | INTRAMUSCULAR | Status: DC
  Administered 2019-09-24 – 2019-09-25 (×2): 40 mg via INTRAVENOUS
  Filled 2019-09-24 (×2): qty 4

## 2019-09-24 NOTE — Progress Notes (Signed)
PROGRESS NOTE    Misty Edwards  ZOX:[id number] DOB: June 23, 1926 DOA: 09/23/2019 PCP: Susy Frizzle, MD   Chief Complaint  Patient presents with  . Shortness of Breath    Brief Narrative:  As per H&P written by [name] on 09/23/2019 86 year old female with past medical history significant of type 2 diabetes, coronary disease status post CABG in 2016, hypertension, hyperlipidemia, chronic diastolic heart failure and chronic pancreatitis; who presented to the hospital secondary to shortness of breath and swelling.  Patient found with acute respiratory failure with hypoxia in the setting of flash pulmonary edema and vascular congestion due to acute on chronic diastolic heart failure.  Patient ended requiring BiPAP at time of admission secondary to ongoing hypoxia (desaturation 82% on presentation).  assessment & Plan: 1-Acute respiratory failure with hypoxemia: In the setting of acute on chronic diastolic CHF. -End requiring BiPAP at time of admission; now with oxygen supplementation through nasal cannula 2-3 L with good O2 sat. -Still short of breath, with fine crackles on examination and complaining of shortness of breath with activity. -Continue IV diuresis, will increase Lasix to 40 mg every 12 hours and follow closely electrolytes and renal function. -Continue to follow daily weights, strict I's and O's and low-sodium diet. -Repeat chest x-ray to further assess vascular congestion seen at time of admission and repeat 2D echo.  2-Essential hypertension -Stable currently; initially presented with hypertensive urgency. -continue current management with adjustment to allow more room for diuresis.    3-Chronic pancreatitis (HCC) -No abdominal pain -Continue to follow heart healthy diet -Continue Creon.  4-Dyslipidemia -Continue statins.  5-initially with urinary retention -Bladder scan with 400 mL inside her bladder; in and out cath x1 provided -patient on lasix now and  vomiting with problem -continue to monitor urine output  6-type 2 diabetes mellitus with nephropathy -Continue to follow CBGs -Continue sliding scale insulin -Holding oral hypoglycemic agents while inpatient.    7-Acute on chronic kidney disease stage IIIb -In the setting of acute on chronic heart failure and decreased perfusion -Continue to closely follow renal function with diuresis -Creatinine trending down.  8-hyponatremia -In the setting of delusional effect from fluid overload -Continue to follow electrolytes while actively diuresing.    DVT prophylaxis: heparin  Code Status: Full Family Communication: grandson and daughter at bedside. Disposition:   Status is: Inpatient  Dispo: The patient is from: Home              Anticipated d/c is to: To be determined; hoping to be home              Anticipated d/c date is: 09/26/19              Patient currently not medically for discharge; still very fine crackles on examination, complaining of shortness of breath and requiring oxygen supplementation.  She is a stable to move out of the stepdown unit to telemetry bed.  Continue diuresis, supportive care and follow clinical response.  Follow 2D echo and chest x-ray results.       Consultants:   None   Procedures:  See below for x-ray reports.   Antimicrobials:  None    Subjective: Overnight requiring BiPAP support; no nausea, no vomiting, no fever. Currently 2-3 L nasal cannula supplementation with good O2 sat. Reports overall feeling improvement in her breathing even is still complains of orthopnea. Increased urine output reported.  Objective: Vitals:   09/24/19 1100 09/24/19 1200 09/24/19 1300 09/24/19 1500  BP: (!) 126/42 Marland Kitchen)  128/48 (!) 125/43 (!) 121/37  Pulse: (!) 55 (!) 56 (!) 58 (!) 55  Resp: (!) 27 (!) 25 (!) 25 (!) 24  Temp:  98.2 F (36.8 C)    TempSrc:  Oral    SpO2: 98% 99% 98% 99%  Weight:      Height:        Intake/Output Summary (Last 24  hours) at 09/24/2019 1605 Last data filed at 09/24/2019 0263 Gross per 24 hour  Intake 11.49 ml  Output 1950 ml  Net -1938.51 ml   Filed Weights   09/23/19 1349 09/23/19 2326  Weight: 68 kg 65.1 kg    Examination:  General exam: Appears in no major distress, still requiring 2L [place] supplementation and complaining of orthopnea. No fever. Patient reports good urine output. Respiratory system: Positive rhonchi and fine crackles bilaterally, no wheezing. No using accessory muscles. Cardiovascular system: Mild JVD appreciated, rate controlled, no rubs, no gallops. Gastrointestinal system: Abdomen is nondistended, soft and nontender. No organomegaly or masses felt. Normal bowel sounds heard. Central nervous system: Alert and oriented. No focal neurological deficits. Extremities: Trace to one plus edema bilaterally; no cyanosis or clubbing. Skin: No rashes, no petechiae. Psychiatry: Mood & affect appropriate.     Data Reviewed: I have personally reviewed following labs and imaging studies  CBC: Recent Labs  Lab 09/23/19 1354 09/23/19 1356  WBC 12.7*  --   NEUTROABS 6.1  --   HGB 12.3 12.9  HCT 38.7 38.0  MCV 90.4  --   PLT 204  --     Basic Metabolic Panel: Recent Labs  Lab 09/23/19 1354 09/23/19 1356 09/24/19 0441  NA 129* 130* 131*  K 4.5 4.9 4.2  CL 95* 96* 93*  CO2 19*  --  26  GLUCOSE 258* 261* 169*  BUN 20 22 23   CREATININE 1.17* 1.20* 1.07*  CALCIUM 8.7*  --  8.9  MG 2.0  --   --     GFR: Estimated Creatinine Clearance: 31.7 mL/min (A) (by C-G formula based on SCr of 1.07 mg/dL (H)).  CBG: Recent Labs  Lab 09/23/19 1736 09/23/19 2327 09/24/19 0802 09/24/19 1134  GLUCAP 167* 161* 179* 203*     Recent Results (from the past 240 hour(s))  SARS Coronavirus 2 by RT PCR (hospital order, performed in [facility] hospital lab) Nasopharyngeal Nasopharyngeal Swab     Status: None   Collection Time: 09/23/19  9:35 PM   Specimen: Nasopharyngeal Swab  Result  Value Ref Range Status   SARS Coronavirus 2 NEGATIVE NEGATIVE Final    Comment: (NOTE) SARS-CoV-2 target nucleic acids are NOT DETECTED.  The SARS-CoV-2 RNA is generally detectable in upper and lower respiratory specimens during the acute phase of infection. The lowest concentration of SARS-CoV-2 viral copies this assay can detect is 250 copies / mL. A negative result does not preclude SARS-CoV-2 infection and should not be used as the sole basis for treatment or other patient management decisions.  A negative result may occur with improper specimen collection / handling, submission of specimen other than nasopharyngeal swab, presence of viral mutation(s) within the areas targeted by this assay, and inadequate number of viral copies (<250 copies / mL). A negative result must be combined with clinical observations, patient history, and epidemiological information.  Fact Sheet for Patients:   StrictlyIdeas.no  Fact Sheet for Healthcare Providers: BankingDealers.co.za  This test is not yet approved or  cleared by the [place] FDA and has been authorized for detection and/or  diagnosis of SARS-CoV-2 by FDA under an Emergency Use Authorization (EUA).  This EUA will remain in effect (meaning this test can be used) for the duration of the COVID-19 declaration under Section 564(b)(1) of the Act, 21 U.S.C. section 360bbb-3(b)(1), unless the authorization is terminated or revoked sooner.  Performed at [facility], [address] [place], [place] [address]   MRSA PCR Screening     Status: None   Collection Time: 09/23/19 11:11 PM   Specimen: Nasopharyngeal  Result Value Ref Range Status   MRSA by PCR NEGATIVE NEGATIVE Final    Comment:        The GeneXpert MRSA Assay (FDA approved for NASAL specimens only), is one component of a comprehensive MRSA colonization surveillance program. It is not intended to diagnose MRSA infection nor  to guide or monitor treatment for MRSA infections. Performed at [facility], [address], [place], [place] [address]      Radiology Studies: DG Chest Portable 1 View  Result Date: 09/23/2019 CLINICAL DATA:  Hypoxic.  COVID test disorder. EXAM: PORTABLE CHEST 1 VIEW COMPARISON:  September 13, 2018 FINDINGS: The mediastinal contour and cardiac silhouette are normal. Increased pulmonary interstitium is identified bilaterally. Minimal left pleural effusion is identified. Surgical clips are projected over the right chest and right axilla. IMPRESSION: Increased pulmonary interstitium is identified bilaterally. This can be seen in viral infection or interstitial edema. Minimal left pleural effusion. Electronically Signed   By: Abelardo Diesel M.D.   On: 09/23/2019 14:48   Scheduled Meds: . amLODipine  5 mg Oral Daily  . aspirin EC  81 mg Oral Daily  . brimonidine  1 drop Both Eyes Q12H   And  . timolol  1 drop Both Eyes Q12H  . Chlorhexidine Gluconate Cloth  6 each Topical Daily  . ferrous sulfate  325 mg Oral Q breakfast  . FLUoxetine  20 mg Oral QHS  . furosemide  40 mg Intravenous Q12H  . gabapentin  100 mg Oral QHS  . heparin  5,000 Units Subcutaneous Q8H  . insulin aspart  0-9 Units Subcutaneous TID WC  . lipase/protease/amylase  24,000 Units Oral 5 X Daily  . mouth rinse  15 mL Mouth Rinse BID  . metoprolol tartrate  25 mg Oral BID  . pantoprazole  40 mg Oral q AM  . rosuvastatin  5 mg Oral QHS  . sodium chloride flush  3 mL Intravenous Q12H   Continuous Infusions: . sodium chloride    . nitroGLYCERIN Stopped (09/24/19 0135)     LOS: 1 day    Time spent: 35 minutes.    Barton Dubois, MD [facility]   To contact the attending provider between 7A-7P or the covering provider during after hours 7P-7A, please log into the web site [email or web address] and access using universal [place] password for that web site. If you do not have the password, please call the hospital  operator.  09/24/2019, 4:05 PM

## 2019-09-25 ENCOUNTER — Inpatient Hospital Stay (HOSPITAL_COMMUNITY): Payer: Medicare HMO

## 2019-09-25 DIAGNOSIS — I5031 Acute diastolic (congestive) heart failure: Secondary | ICD-10-CM

## 2019-09-25 LAB — ECHOCARDIOGRAM COMPLETE
Height: 63 in
Weight: 2261.04 oz

## 2019-09-25 LAB — URINE CULTURE: Culture: NO GROWTH

## 2019-09-25 LAB — BASIC METABOLIC PANEL
Anion gap: 15 (ref 5–15)
BUN: 39 mg/dL — ABNORMAL HIGH (ref 8–23)
CO2: 27 mmol/L (ref 22–32)
Calcium: 9.1 mg/dL (ref 8.9–10.3)
Chloride: 91 mmol/L — ABNORMAL LOW (ref 98–111)
Creatinine, Ser: 1.61 mg/dL — ABNORMAL HIGH (ref 0.44–1.00)
GFR calc Af Amer: 32 mL/min — ABNORMAL LOW (ref >60)
GFR calc non Af Amer: 28 mL/min — ABNORMAL LOW (ref >60)
Glucose, Bld: 260 mg/dL — ABNORMAL HIGH (ref 70–99)
Potassium: 4.2 mmol/L (ref 3.5–5.1)
Sodium: 133 mmol/L — ABNORMAL LOW (ref 135–145)

## 2019-09-25 LAB — GLUCOSE, CAPILLARY
Glucose-Capillary: 264 mg/dL — ABNORMAL HIGH (ref 70–99)
Glucose-Capillary: 432 mg/dL — ABNORMAL HIGH (ref 70–99)
Glucose-Capillary: 373 mg/dL — ABNORMAL HIGH (ref 70–99)
Glucose-Capillary: 275 mg/dL — ABNORMAL HIGH (ref 70–99)
Glucose-Capillary: 202 mg/dL — ABNORMAL HIGH (ref 70–99)

## 2019-09-25 MED ORDER — INSULIN GLARGINE 100 UNIT/ML ~~LOC~~ SOLN
10.0000 [IU] | Freq: Every day | SUBCUTANEOUS | Status: DC
  Administered 2019-09-25: 10 [IU] via SUBCUTANEOUS
  Filled 2019-09-25 (×3): qty 0.1

## 2019-09-25 MED ORDER — FUROSEMIDE 10 MG/ML IJ SOLN
40.0000 mg | Freq: Every day | INTRAMUSCULAR | Status: DC
  Administered 2019-09-26: 40 mg via INTRAVENOUS
  Filled 2019-09-25: qty 4

## 2019-09-25 MED ORDER — INSULIN ASPART 100 UNIT/ML ~~LOC~~ SOLN
15.0000 [IU] | Freq: Once | SUBCUTANEOUS | Status: AC
  Administered 2019-09-25: 15 [IU] via SUBCUTANEOUS

## 2019-09-25 NOTE — Progress Notes (Signed)
*  PRELIMINARY RESULTS* Echocardiogram 2D Echocardiogram has been performed.  Misty Edwards 09/25/2019, 11:28 AM

## 2019-09-25 NOTE — Progress Notes (Signed)
PROGRESS NOTE    Misty Edwards  YJE:[id number] DOB: 01/15/1927 DOA: 09/23/2019 PCP: Susy Frizzle, MD   Chief Complaint  Patient presents with  . Shortness of Breath    Brief Narrative:  As per H&P written by [name] on 09/23/2019 86 year old female with past medical history significant of type 2 diabetes, coronary disease status post CABG in 2016, hypertension, hyperlipidemia, chronic diastolic heart failure and chronic pancreatitis; who presented to the hospital secondary to shortness of breath and swelling.  Patient found with acute respiratory failure with hypoxia in the setting of flash pulmonary edema and vascular congestion due to acute on chronic diastolic heart failure.  Patient ended requiring BiPAP at time of admission secondary to ongoing hypoxia (desaturation 82% on presentation).  assessment & Plan: 1-Acute respiratory failure with hypoxemia: In the setting of acute on chronic diastolic CHF. -End requiring BiPAP at time of admission; now with oxygen supplementation through nasal cannula 2 L with good O2 sat. -Still short of breath with activity, no frank crackles and reporting no orthopnea. -Continue IV diuresis and follow closely electrolytes and renal function. -Continue to follow daily weights, strict I's and O's and low-sodium diet. -Repeat chest x-ray to further assess vascular congestion seen at time of admission pending and will follow 2D echo.  2-Essential hypertension -Stable currently; initially presented with hypertensive urgency. -continue current management with adjustment to allow more room for diuresis.    3-Chronic pancreatitis (HCC) -No abdominal pain -Continue to follow heart healthy diet -Continue Creon.  4-Dyslipidemia -Continue statins.  5-initially with urinary retention -Bladder scan with 400 mL inside her bladder; in and out cath x1 provided -patient on lasix now and vomiting with problem -continue to monitor urine output  6-type  2 diabetes mellitus with nephropathy -Continue to follow CBGs -CBG elevated now that she is eating more, will add long acting insulin coverage and continue SSI.  7-Acute on chronic kidney disease stage IIIb -In the setting of acute on chronic heart failure and decreased perfusion -Continue to closely follow renal function with diuresis  8-hyponatremia -In the setting of delutional effect from fluid overload -Continue to follow electrolytes while actively diuresing. -NA 133 now.   DVT prophylaxis: heparin  Code Status: Full Family Communication: grandson and daughter at bedside. Disposition:   Status is: Inpatient  Dispo: The patient is from: Home              Anticipated d/c is to: To be determined; hoping to be home              Anticipated d/c date is: 09/26/19              Patient currently not medically for discharge; still requiring oxygen supplementation and feeling SOB with activity. No frank crackles appreciated and denies orthopnea today. Follow 2D echo and chest x-ray results.       Consultants:   None   Procedures:  See below for x-ray reports.   Antimicrobials:  None    Subjective: Reports good urine output and improvement in her breathing. Still requiring oxygen supplementation and feeling SOB on exertion. Denies orthopnea.   Objective: Vitals:   09/25/19 0002 09/25/19 0435 09/25/19 0747 09/25/19 0838  BP: (!) 118/48 (!) 117/47 122/60 (!) 144/43  Pulse: (!) 54 (!) 55 62 60  Resp: 16 16 18    Temp:  97.9 F (36.6 C) 98.2 F (36.8 C)   TempSrc:  Oral Oral   SpO2: 98% 95% 98%   Weight:  Height:        Intake/Output Summary (Last 24 hours) at 09/25/2019 1108 Last data filed at 09/25/2019 0900 Gross per 24 hour  Intake 320 ml  Output --  Net 320 ml   Filed Weights   09/23/19 1349 09/23/19 2326 09/24/19 1847  Weight: 68 kg 65.1 kg 64.1 kg    Examination: General exam: Alert, awake, oriented x 3; no CP, no nausea, no vomiting. Reports  breathing improving, no orthopnea today; still slightly SOB on exertion and using 2L [place]. Respiratory system: positive rhonchi, no wheezing, decrease BS at baseline, no frank crackles.  Cardiovascular system:Rate controlled, no rubs, no gallops.  Gastrointestinal system: Abdomen is nondistended, soft and nontender. No organomegaly or masses felt. Normal bowel sounds heard. Central nervous system: Alert and oriented. No focal neurological deficits. Extremities: No cyanosis, no clubbing. Skin: No petechiae.  Psychiatry: Judgement and insight appear normal. Mood & affect appropriate.    Data Reviewed: I have personally reviewed following labs and imaging studies  CBC: Recent Labs  Lab 09/23/19 1354 09/23/19 1356  WBC 12.7*  --   NEUTROABS 6.1  --   HGB 12.3 12.9  HCT 38.7 38.0  MCV 90.4  --   PLT 204  --     Basic Metabolic Panel: Recent Labs  Lab 09/23/19 1354 09/23/19 1356 09/24/19 0441 09/25/19 0607  NA 129* 130* 131* 133*  K 4.5 4.9 4.2 4.2  CL 95* 96* 93* 91*  CO2 19*  --  26 27  GLUCOSE 258* 261* 169* 260*  BUN 20 22 23  39*  CREATININE 1.17* 1.20* 1.07* 1.61*  CALCIUM 8.7*  --  8.9 9.1  MG 2.0  --   --   --     GFR: Estimated Creatinine Clearance: 20.9 mL/min (A) (by C-G formula based on SCr of 1.61 mg/dL (H)).  CBG: Recent Labs  Lab 09/24/19 1134 09/24/19 1655 09/24/19 2030 09/25/19 0754 09/25/19 1056  GLUCAP 203* 210* 233* 264* 432*     Recent Results (from the past 240 hour(s))  Urine culture     Status: None   Collection Time: 09/23/19  6:26 PM   Specimen: Urine, Clean Catch  Result Value Ref Range Status   Specimen Description   Final    URINE, CLEAN CATCH Performed at [facility], [address] [place], [place] [address]    Special Requests   Final    NONE Performed at [facility], [address], [place], [place] [address]    Culture   Final    NO GROWTH Performed at [facility], [place] [address], [place], [place] [address]     Report Status 09/25/2019 FINAL  Final  SARS Coronavirus 2 by RT PCR (hospital order, performed in [facility] hospital lab) Nasopharyngeal Nasopharyngeal Swab     Status: None   Collection Time: 09/23/19  9:35 PM   Specimen: Nasopharyngeal Swab  Result Value Ref Range Status   SARS Coronavirus 2 NEGATIVE NEGATIVE Final    Comment: (NOTE) SARS-CoV-2 target nucleic acids are NOT DETECTED.  The SARS-CoV-2 RNA is generally detectable in upper and lower respiratory specimens during the acute phase of infection. The lowest concentration of SARS-CoV-2 viral copies this assay can detect is 250 copies / mL. A negative result does not preclude SARS-CoV-2 infection and should not be used as the sole basis for treatment or other patient management decisions.  A negative result may occur with improper specimen collection / handling, submission of specimen other than  nasopharyngeal swab, presence of viral mutation(s) within the areas targeted by this assay, and inadequate number of viral copies (<250 copies / mL). A negative result must be combined with clinical observations, patient history, and epidemiological information.  Fact Sheet for Patients:   StrictlyIdeas.no  Fact Sheet for Healthcare Providers: BankingDealers.co.za  This test is not yet approved or  cleared by the [place] FDA and has been authorized for detection and/or diagnosis of SARS-CoV-2 by FDA under an Emergency Use Authorization (EUA).  This EUA will remain in effect (meaning this test can be used) for the duration of the COVID-19 declaration under Section 564(b)(1) of the Act, 21 U.S.C. section 360bbb-3(b)(1), unless the authorization is terminated or revoked sooner.  Performed at [facility], [address] [place], [place] [address]   MRSA PCR Screening     Status: None   Collection Time: 09/23/19 11:11 PM   Specimen: Nasopharyngeal  Result Value Ref Range Status    MRSA by PCR NEGATIVE NEGATIVE Final    Comment:        The GeneXpert MRSA Assay (FDA approved for NASAL specimens only), is one component of a comprehensive MRSA colonization surveillance program. It is not intended to diagnose MRSA infection nor to guide or monitor treatment for MRSA infections. Performed at [facility], [address], [place], [place] [address]      Radiology Studies: DG Chest Portable 1 View  Result Date: 09/23/2019 CLINICAL DATA:  Hypoxic.  COVID test disorder. EXAM: PORTABLE CHEST 1 VIEW COMPARISON:  September 13, 2018 FINDINGS: The mediastinal contour and cardiac silhouette are normal. Increased pulmonary interstitium is identified bilaterally. Minimal left pleural effusion is identified. Surgical clips are projected over the right chest and right axilla. IMPRESSION: Increased pulmonary interstitium is identified bilaterally. This can be seen in viral infection or interstitial edema. Minimal left pleural effusion. Electronically Signed   By: Abelardo Diesel M.D.   On: 09/23/2019 14:48   Scheduled Meds: . amLODipine  5 mg Oral Daily  . aspirin EC  81 mg Oral Daily  . brimonidine  1 drop Both Eyes Q12H   And  . timolol  1 drop Both Eyes Q12H  . Chlorhexidine Gluconate Cloth  6 each Topical Daily  . ferrous sulfate  325 mg Oral Q breakfast  . FLUoxetine  20 mg Oral QHS  . furosemide  40 mg Intravenous Q12H  . gabapentin  100 mg Oral QHS  . heparin  5,000 Units Subcutaneous Q8H  . insulin aspart  0-9 Units Subcutaneous TID WC  . insulin aspart  15 Units Subcutaneous Once  . insulin glargine  10 Units Subcutaneous Daily  . lipase/protease/amylase  24,000 Units Oral 5 X Daily  . mouth rinse  15 mL Mouth Rinse BID  . metoprolol tartrate  25 mg Oral BID  . pantoprazole  40 mg Oral q AM  . rosuvastatin  5 mg Oral QHS  . sodium chloride flush  3 mL Intravenous Q12H   Continuous Infusions: . sodium chloride    . nitroGLYCERIN Stopped (09/24/19 0135)     LOS: 2 days     Time spent: 35 minutes.    Barton Dubois, MD [facility]   To contact the attending provider between 7A-7P or the covering provider during after hours 7P-7A, please log into the web site [email or web address] and access using universal [place] password for that web site. If you do not have the password, please call the hospital operator.  09/25/2019, 11:08 AM

## 2019-09-26 ENCOUNTER — Other Ambulatory Visit: Payer: Self-pay | Admitting: Family Medicine

## 2019-09-26 DIAGNOSIS — J810 Acute pulmonary edema: Secondary | ICD-10-CM

## 2019-09-26 LAB — BASIC METABOLIC PANEL
Anion gap: 14 (ref 5–15)
BUN: 46 mg/dL — ABNORMAL HIGH (ref 8–23)
CO2: 26 mmol/L (ref 22–32)
Calcium: 9.4 mg/dL (ref 8.9–10.3)
Chloride: 90 mmol/L — ABNORMAL LOW (ref 98–111)
Creatinine, Ser: 1.52 mg/dL — ABNORMAL HIGH (ref 0.44–1.00)
GFR calc Af Amer: 35 mL/min — ABNORMAL LOW (ref >60)
GFR calc non Af Amer: 30 mL/min — ABNORMAL LOW (ref >60)
Glucose, Bld: 433 mg/dL — ABNORMAL HIGH (ref 70–99)
Potassium: 4.2 mmol/L (ref 3.5–5.1)
Sodium: 130 mmol/L — ABNORMAL LOW (ref 135–145)

## 2019-09-26 LAB — GLUCOSE, CAPILLARY
Glucose-Capillary: 420 mg/dL — ABNORMAL HIGH (ref 70–99)
Glucose-Capillary: 410 mg/dL — ABNORMAL HIGH (ref 70–99)

## 2019-09-26 LAB — HEMOGLOBIN A1C
Hgb A1c MFr Bld: 6.4 % — ABNORMAL HIGH (ref 4.8–5.6)
Mean Plasma Glucose: 136.98 mg/dL

## 2019-09-26 MED ORDER — INSULIN ASPART 100 UNIT/ML ~~LOC~~ SOLN
17.0000 [IU] | Freq: Once | SUBCUTANEOUS | Status: AC
  Administered 2019-09-26: 17 [IU] via SUBCUTANEOUS

## 2019-09-26 MED ORDER — INSULIN ASPART 100 UNIT/ML ~~LOC~~ SOLN: 0.0000 [IU] | Freq: Every day | SUBCUTANEOUS | Status: DC

## 2019-09-26 MED ORDER — INSULIN ASPART 100 UNIT/ML ~~LOC~~ SOLN
0.0000 [IU] | Freq: Three times a day (TID) | SUBCUTANEOUS | Status: DC
  Administered 2019-09-26: 15 [IU] via SUBCUTANEOUS

## 2019-09-26 MED ORDER — FUROSEMIDE 40 MG PO TABS: 40.0000 mg | ORAL_TABLET | Freq: Every day | ORAL | 2 refills | Status: DC

## 2019-09-26 MED ORDER — INSULIN GLARGINE 100 UNIT/ML ~~LOC~~ SOLN
22.0000 [IU] | Freq: Every day | SUBCUTANEOUS | Status: DC
  Administered 2019-09-26: 22 [IU] via SUBCUTANEOUS
  Filled 2019-09-26 (×2): qty 0.22

## 2019-09-26 NOTE — Progress Notes (Signed)
Nsg Discharge Note  Admit Date:  09/23/2019 Discharge date: 09/26/2019   [name] to be D/C'd home per MD order.  AVS completed.  Copy for chart, and copy for patient signed, and dated. Patient/caregiver able to verbalize understanding.  Discharge Medication: Allergies as of 09/26/2019      Reactions   Calcium-containing Compounds Nausea Only   Morphine And Related Other (See Comments)   Hallucination   Raloxifene Itching   Evista- Face and eyes burning   Vitamin D Analogs Nausea Only      Medication List    STOP taking these medications   lisinopril 2.5 MG tablet Commonly known as: ZESTRIL   metFORMIN 1000 MG tablet Commonly known as: GLUCOPHAGE     TAKE these medications   amLODipine 5 MG tablet Commonly known as: NORVASC Take 1 tablet by mouth once daily What changed: when to take this   aspirin 81 MG EC tablet Take 1 tablet (81 mg total) by mouth daily. What changed: when to take this   Benefiber Drink Mix Pack Take 1 Package by mouth daily as needed (for supplement-digestion).   calcium carbonate 500 MG chewable tablet Commonly known as: TUMS - dosed in mg elemental calcium Chew 3 tablets by mouth at bedtime.   Combigan 0.2-0.5 % ophthalmic solution Generic drug: brimonidine-timolol Place 1 drop into both eyes every 12 (twelve) hours as needed.   Creon 24000-76000 units Cpep Generic drug: Pancrelipase (Lip-Prot-Amyl) TAKE 2 CAPSULES BY MOUTH WITH MEALS AND 1 CAP WITH SNACKS x 2 What changed:   how much to take  how to take this  when to take this  additional instructions   cycloSPORINE 0.05 % ophthalmic emulsion Commonly known as: RESTASIS Place 1 drop into both eyes 3 (three) times daily as needed.   EQ Allergy Relief (Cetirizine) 10 MG tablet Generic drug: cetirizine Take 1 tablet by mouth once daily What changed:   how much to take  when to take this   feeding supplement (ENSURE ENLIVE) Liqd Take 237 mLs by mouth 2 (two) times  daily between meals.   ferrous sulfate 325 (65 FE) MG tablet Take 325 mg by mouth daily with breakfast.   FLUoxetine 20 MG tablet Commonly known as: PROZAC Take 1 tablet by mouth once daily What changed: when to take this   furosemide 40 MG tablet Commonly known as: LASIX Take 1 tablet (40 mg total) by mouth daily. What changed:   medication strength  how much to take   gabapentin 100 MG capsule Commonly known as: NEURONTIN TAKE 1 CAPSULE BY MOUTH THREE TIMES DAILY AS NEEDED FOR  NERVE  PAIN What changed: See the new instructions.   LORazepam 1 MG tablet Commonly known as: ATIVAN TAKE 1/2 TO 1 (ONE-HALF TO ONE) TABLET BY MOUTH TWICE DAILY AS NEEDED What changed: See the new instructions.   magnesium oxide 400 (241.3 Mg) MG tablet Commonly known as: MAG-OX Take 1 tablet by mouth once daily   metoprolol tartrate 25 MG tablet Commonly known as: LOPRESSOR Take 1 tablet by mouth twice daily   multivitamin capsule Take 1 capsule by mouth every morning.   NovoLIN 70/30 ReliOn (70-30) 100 UNIT/ML injection Generic drug: insulin NPH-regular Human INJECT 25 UNITS SUBCUTANEOUSLY IN THE MORNING AND 15 IN THE EVENING What changed: See the new instructions.   pantoprazole 40 MG tablet Commonly known as: PROTONIX Take 1 tablet by mouth once daily What changed: when to take this   polyethylene glycol 17 g packet  Commonly known as: MIRALAX / GLYCOLAX Take 17 g by mouth daily. What changed:   when to take this  reasons to take this   potassium chloride 10 MEQ tablet Commonly known as: KLOR-CON TAKE 1 TABLET BY MOUTH ONCE DAILY AS NEEDED. TAKE WITH FUROSEMIDE. What changed: See the new instructions.   rosuvastatin 5 MG tablet Commonly known as: CRESTOR Take 1 tablet by mouth once daily What changed: when to take this   triamcinolone cream 0.1 % Commonly known as: KENALOG APPLY  CREAM EXTERNALLY TWICE DAILY What changed: See the new instructions.        Discharge Assessment: Vitals:   09/25/19 2254 09/26/19 0555  BP: (!) 108/44 (!) 160/66  Pulse: 60 81  Resp:  17  Temp:  97.9 F (36.6 C)  SpO2:  96%   Skin clean, dry and intact without evidence of skin break down, no evidence of skin tears noted. IV catheter discontinued intact. Site without signs and symptoms of complications - no redness or edema noted at insertion site, patient denies c/o pain - only slight tenderness at site.  Dressing with slight pressure applied.  D/c Instructions-Education: Discharge instructions given to patient/family with verbalized understanding. D/c education completed with patient/family including follow up instructions, medication list, d/c activities limitations if indicated, with other d/c instructions as indicated by MD - patient able to verbalize understanding, all questions fully answered. Patient instructed to return to ED, call 911, or call MD for any changes in condition.  Patient escorted via [place], and D/C home via private auto.  Zachery Conch, RN 09/26/2019 2:40 PM

## 2019-09-26 NOTE — Evaluation (Signed)
Physical Therapy Evaluation Patient Details Name: Misty Edwards MRN: [id number] DOB: 1926-04-28 Today's Date: 09/26/2019   History of Present Illness  Misty Edwards  is a 86 y.o. female, with past medical history of type 2 diabetes mellitus, insulin-dependent, CAD status post CABG in 2016, hypertension, hyperlipidemia, patient presents to ED secondary to shortness of breath, patient currently on BiPAP, it is difficult to obtain history from patient, she is able sometimes to answer some yes and no questions , grandson at bed side assist with the history, patient at her usual state of health this morning, she ate her breakfast with no acute issues, she took her medications, but apparently she has been out of her Lasix medication for last couple days, as she ran out of them, and she was awaiting for refills, her blood pressure medicine this morning, she started to to have dyspnea, rapidly progressive, as any cough, fever, chills, or chest pain, EMS were called, reportedly she had hypoxia in the 30s upon the presentation where she had put on CPAP, she was saturating 82% on presentation on CPAP, which she has been transitioned to BiPAP, patient reports significant improvement of her symptoms after receiving Lasix, nitro drip and morphine.- in ED patient hypoxic 82% on CPAP, chest x-ray significant for interstitial findings chest for nontypical pneumonia versus volume overload (she finished both of her Covid vaccine 2 months ago close (, she denies fever, chills or cough, Significant for sodium of 129, creatinine of 1.17 which is around her baseline, BNP was elevated at 719, high-sensitivity troponin within normal limit at 6, and received IV Lasix, started on nitro drip for systolic blood pressure of 190, and he with IV morphine, [facility]  hospitalist consulted to admit.    Clinical Impression  Patient functioning at baseline for functional mobility and gait.  Plan:  Patient discharged from physical therapy to  care of nursing for ambulation daily as tolerated for length of stay.    Follow Up Recommendations No PT follow up    Equipment Recommendations  None recommended by PT    Recommendations for Other Services       Precautions / Restrictions Precautions Precautions: Fall Restrictions Weight Bearing Restrictions: No      Mobility  Bed Mobility Overal bed mobility: Modified Independent                Transfers Overall transfer level: Modified independent Equipment used: Rolling walker (2 wheeled)                Ambulation/Gait Ambulation/Gait assistance: Modified independent (Device/Increase time);Supervision Gait Distance (Feet): 80 Feet Assistive device: Rolling walker (2 wheeled) Gait Pattern/deviations: WFL(Within Functional Limits) Gait velocity: slightly decreased   General Gait Details: demonstrates good return for ambulation in room and hallway without loss of balance using RW  Stairs            Wheelchair Mobility    Modified Rankin (Stroke Patients Only)       Balance Overall balance assessment: Needs assistance Sitting-balance support: Feet supported;No upper extremity supported Sitting balance-Leahy Scale: Good Sitting balance - Comments: seated at EOB   Standing balance support: During functional activity;Bilateral upper extremity supported Standing balance-Leahy Scale: Fair Standing balance comment: fair/good using RW                             Pertinent Vitals/Pain Pain Assessment: No/denies pain    Home Living Family/patient expects to be discharged to:: Private residence  Living Arrangements: Alone Available Help at Discharge: Family;Available 24 hours/day Type of Home: Mobile home Home Access: Ramped entrance     Home Layout: One level Home Equipment: [place] - 2 wheels;Walker - 4 wheels;Shower seat;Bedside commode;Hospital bed Additional Comments: patient legally blind    Prior Function Level of  Independence: Needs assistance   Gait / Transfers Assistance Needed: household ambulator using RW  ADL's / Homemaking Assistance Needed: home aide 4 hours/day x 5 days/week, famiy assist with community ADL's        Hand Dominance   Dominant Hand: Left    Extremity/Trunk Assessment   Upper Extremity Assessment Upper Extremity Assessment: Overall WFL for tasks assessed    Lower Extremity Assessment Lower Extremity Assessment: Overall WFL for tasks assessed    Cervical / Trunk Assessment Cervical / Trunk Assessment: Normal  Communication   Communication: No difficulties  Cognition Arousal/Alertness: Awake/alert Behavior During Therapy: WFL for tasks assessed/performed Overall Cognitive Status: Within Functional Limits for tasks assessed                                        General Comments      Exercises     Assessment/Plan    PT Assessment Patent does not need any further PT services  PT Problem List         PT Treatment Interventions      PT Goals (Current goals can be found in the Care Plan section)  Acute Rehab PT Goals Patient Stated Goal: return home with family and caregivers to assist PT Goal Formulation: With patient Time For Goal Achievement: 09/26/19 Potential to Achieve Goals: Good    Frequency     Barriers to discharge        Co-evaluation               AM-PAC PT "6 Clicks" Mobility  Outcome Measure Help needed turning from your back to your side while in a flat bed without using bedrails?: None Help needed moving from lying on your back to sitting on the side of a flat bed without using bedrails?: None Help needed moving to and from a bed to a chair (including a wheelchair)?: None Help needed standing up from a chair using your arms (e.g., wheelchair or bedside chair)?: None Help needed to walk in hospital room?: A Little Help needed climbing 3-5 steps with a railing? : A Little 6 Click Score: 22    End of  Session Equipment Utilized During Treatment: Oxygen Activity Tolerance: Patient tolerated treatment well;Patient limited by fatigue Patient left: in bed;with call bell/phone within reach Nurse Communication: Mobility status PT Visit Diagnosis: Unsteadiness on feet (R26.81);Other abnormalities of gait and mobility (R26.89);Muscle weakness (generalized) (M62.81)    Time: 9211-9417 PT Time Calculation (min) (ACUTE ONLY): 18 min   Charges:   PT Evaluation $PT Eval Low Complexity: 1 Low PT Treatments $Therapeutic Activity: 8-22 mins        12:09 PM, 09/26/19 Lonell Grandchild, MPT Physical Therapist with [facility] 336 [phone] office [phone] mobile phone

## 2019-09-26 NOTE — Plan of Care (Signed)

## 2019-09-26 NOTE — Discharge Summary (Signed)
Physician Discharge Summary  Misty Edwards DOB: 05-Nov-1926 DOA: 09/23/2019  PCP: Susy Frizzle, MD  Admit date: 09/23/2019 Discharge date: 09/26/2019  Time spent: 35 minutes  Recommendations for Outpatient Follow-up:  Repeat basic metabolic panel to evaluate lites and renal function Reassess patient CBGs with further adjustment to hypoglycemic regimen as needed. Reassess blood pressure and further adjust antihypertensive regimen as required.  Discharge Diagnoses:  Active Problems:   Essential hypertension   Chronic pancreatitis (HCC)   Dyslipidemia   Acute on chronic diastolic CHF (congestive heart failure) (HCC)   Acute respiratory failure with hypoxia (HCC)   Acute respiratory failure (HCC)   Acute pulmonary edema ([place])   Discharge Condition: Stable and improved.  Discharged home with instruction to follow-up with PCP and cardiology service.  CODE STATUS: Full code.  Diet recommendation: Heart healthy diet.  Filed Weights   09/23/19 2326 09/24/19 1847 09/26/19 0555  Weight: 65.1 kg 64.1 kg 68.2 kg    History of present illness:  As per H&P written by [name] on 09/23/2019 86 year old female with past medical history significant of type 2 diabetes, coronary disease status post CABG in 2016, hypertension, hyperlipidemia, chronic diastolic heart failure and chronic pancreatitis; who presented to the hospital secondary to shortness of breath and swelling.  Patient found with acute respiratory failure with hypoxia in the setting of flash pulmonary edema and vascular congestion due to acute on chronic diastolic heart failure.  Patient ended requiring BiPAP at time of admission secondary to ongoing hypoxia (desaturation 82% on presentation).  Hospital Course:  1-Acute respiratory failure with hypoxemia: In the setting of acute on chronic diastolic CHF. -Ended requiring BiPAP at time of admission; now without oxygen supplementation and good O2 sat. -no frank  crackles and reporting no orthopnea. -Continue daily weights and low sodium diet -discharge on lasix 40 mg daily -Repeat echo: see results below.  Serve ejection fraction, no significant valvular disorder.  Unable to evaluate diastolic dysfunction at this time  2-Essential hypertension -Stable currently; initially presented with hypertensive urgency. -continue current management with adjustments reassess VS at follow visit -heart healthy diet encouraged.     3-Chronic pancreatitis (HCC) -No abdominal pain -Continue to follow heart healthy diet -Continue Creon.  4-Dyslipidemia -Continue statins.  5-initially with urinary retention -Bladder scan with 400 mL inside her bladder; in and out cath x1 provided -patient on adjusted dose of lasix now and voiding without problem  6-type 2 diabetes mellitus with nephropathy -Continue to follow CBGs and further adjust hypoglycemic regimen as needed. -Modified carbohydrate diet encouraged.  7-Acute on chronic kidney disease stage IIIb -In the setting of acute on chronic heart failure and decreased perfusion. -Improved and appropriate trending down -Given patient's chronic kidney disease Metformin has been discontinued discharge. -Advised of antibiotic for hydration -Repeat basic metabolic panel to follow renal function trend at follow-up visit.  8-hyponatremia -In the setting of delutional effect from fluid overload -Repeat basic metabolic panel follow-up visit to reassess electrolytes trend and stability.  Procedures:  2-D echo: 1. Distal septal hypokinesis. Left ventricular ejection fraction, by  estimation, is 55%. The left ventricle has normal function. The left  ventricle demonstrates regional wall motion abnormalities (see scoring  diagram/findings for description). Left  ventricular diastolic parameters were normal.  2. Right ventricular systolic function is normal. The right ventricular  size is normal. There is normal  pulmonary artery systolic pressure.  3. Left atrial size was mildly dilated.  4. The mitral valve is normal in structure.  Mild mitral valve  regurgitation.  5. The aortic valve is tricuspid. Aortic valve regurgitation is trivial.  Mild to moderate aortic valve sclerosis/calcification is present, without  any evidence of aortic stenosis.    See below for x-ray reports.   Consultations:  Cardiology service curbside (Dr. Harl Bowie); recommendations given for outpatient follow-up.  Continue current medical management for her prior history of coronary artery disease and heart failure.  Discharge Exam: Vitals:   09/25/19 2254 09/26/19 0555  BP: (!) 108/44 (!) 160/66  Pulse: 60 81  Resp:  17  Temp:  97.9 F (36.6 C)  SpO2:  96%    General: Afebrile, speaking in full sentences; no chest pain, no palpitations, no nausea or vomiting.  No requiring oxygen supplementation at time of discharge.  Feeling ready to go home. Cardiovascular: Denies orthopnea, no rubs, no gallops, no JVD. Respiratory: Improved air movement bilaterally; no using accessory muscles.  No wheezing, no crackles. Abdomen: soft, NT, ND, positive BS. Extremities: no cyanosis or clubbing   Discharge Instructions   Discharge Instructions    (HEART FAILURE PATIENTS) Call MD:  Anytime you have any of the following symptoms: 1) 3 pound weight gain in 24 hours or 5 pounds in 1 week 2) shortness of breath, with or without a dry hacking cough 3) swelling in the hands, feet or stomach 4) if you have to sleep on extra pillows at night in order to breathe.   Complete by: As directed    Diet - low sodium heart healthy   Complete by: As directed    Discharge instructions   Complete by: As directed    Take medications as prescribed Follow low-sodium diet (less than 2g on daily basis) Maintain adequate hydration Arrange follow-up with PCP in 10 days Follow-up with cardiology service (office will contact you with appointment  details).     Allergies as of 09/26/2019      Reactions   Calcium-containing Compounds Nausea Only   Morphine And Related Other (See Comments)   Hallucination   Raloxifene Itching   Evista- Face and eyes burning   Vitamin D Analogs Nausea Only      Medication List    STOP taking these medications   lisinopril 2.5 MG tablet Commonly known as: ZESTRIL   metFORMIN 1000 MG tablet Commonly known as: GLUCOPHAGE     TAKE these medications   amLODipine 5 MG tablet Commonly known as: NORVASC Take 1 tablet by mouth once daily What changed: when to take this   aspirin 81 MG EC tablet Take 1 tablet (81 mg total) by mouth daily. What changed: when to take this   Benefiber Drink Mix Pack Take 1 Package by mouth daily as needed (for supplement-digestion).   calcium carbonate 500 MG chewable tablet Commonly known as: TUMS - dosed in mg elemental calcium Chew 3 tablets by mouth at bedtime.   Combigan 0.2-0.5 % ophthalmic solution Generic drug: brimonidine-timolol Place 1 drop into both eyes every 12 (twelve) hours as needed.   Creon 24000-76000 units Cpep Generic drug: Pancrelipase (Lip-Prot-Amyl) TAKE 2 CAPSULES BY MOUTH WITH MEALS AND 1 CAP WITH SNACKS x 2 What changed:   how much to take  how to take this  when to take this  additional instructions   cycloSPORINE 0.05 % ophthalmic emulsion Commonly known as: RESTASIS Place 1 drop into both eyes 3 (three) times daily as needed.   EQ Allergy Relief (Cetirizine) 10 MG tablet Generic drug: cetirizine Take 1 tablet by mouth once  daily What changed:   how much to take  when to take this   feeding supplement (ENSURE ENLIVE) Liqd Take 237 mLs by mouth 2 (two) times daily between meals.   ferrous sulfate 325 (65 FE) MG tablet Take 325 mg by mouth daily with breakfast.   FLUoxetine 20 MG tablet Commonly known as: PROZAC Take 1 tablet by mouth once daily What changed: when to take this   furosemide 40 MG  tablet Commonly known as: LASIX Take 1 tablet (40 mg total) by mouth daily. What changed:   medication strength  how much to take   gabapentin 100 MG capsule Commonly known as: NEURONTIN TAKE 1 CAPSULE BY MOUTH THREE TIMES DAILY AS NEEDED FOR  NERVE  PAIN What changed: See the new instructions.   LORazepam 1 MG tablet Commonly known as: ATIVAN TAKE 1/2 TO 1 (ONE-HALF TO ONE) TABLET BY MOUTH TWICE DAILY AS NEEDED What changed: See the new instructions.   magnesium oxide 400 (241.3 Mg) MG tablet Commonly known as: MAG-OX Take 1 tablet by mouth once daily   metoprolol tartrate 25 MG tablet Commonly known as: LOPRESSOR Take 1 tablet by mouth twice daily   multivitamin capsule Take 1 capsule by mouth every morning.   NovoLIN 70/30 ReliOn (70-30) 100 UNIT/ML injection Generic drug: insulin NPH-regular Human INJECT 25 UNITS SUBCUTANEOUSLY IN THE MORNING AND 15 IN THE EVENING What changed: See the new instructions.   pantoprazole 40 MG tablet Commonly known as: PROTONIX Take 1 tablet by mouth once daily What changed: when to take this   polyethylene glycol 17 g packet Commonly known as: MIRALAX / GLYCOLAX Take 17 g by mouth daily. What changed:   when to take this  reasons to take this   potassium chloride 10 MEQ tablet Commonly known as: KLOR-CON TAKE 1 TABLET BY MOUTH ONCE DAILY AS NEEDED. TAKE WITH FUROSEMIDE. What changed: See the new instructions.   rosuvastatin 5 MG tablet Commonly known as: CRESTOR Take 1 tablet by mouth once daily What changed: when to take this   triamcinolone cream 0.1 % Commonly known as: KENALOG APPLY  CREAM EXTERNALLY TWICE DAILY What changed: See the new instructions.      Allergies  Allergen Reactions  . Calcium-Containing Compounds Nausea Only  . Morphine And Related Other (See Comments)    Hallucination  . Raloxifene Itching    Evista- Face and eyes burning  . Vitamin D Analogs Nausea Only    Follow-up Information     Susy Frizzle, MD. Schedule an appointment as soon as possible for a visit in 10 day(s).   Specialty: Family Medicine Contact information: [address] [place] [address] [phone]        Arnoldo Lenis, MD .   Specialty: Cardiology Contact information: [address] [place] [address] [phone]               The results of significant diagnostics from this hospitalization (including imaging, microbiology, ancillary and laboratory) are listed below for reference.    Significant Diagnostic Studies: DG Chest 2 View  Result Date: 09/25/2019 CLINICAL DATA:  sob EXAM: CHEST - 2 VIEW COMPARISON:  09/23/2019 FINDINGS: Significant improvement in the interstitial edema seen previously. Small residual pleural effusions bilaterally. Heart size and mediastinal contours are within normal limits. Aortic Atherosclerosis (ICD10-170.0). Previous CABG. No pneumothorax.  Surgical clips right axilla. Sternotomy wires.  Cholecystectomy clips. IMPRESSION: Interval improvement in interstitial edema with small residual pleural effusions. Electronically  Signed   By: Lucrezia Europe M.D.   On: 09/25/2019 13:49   DG Chest Portable 1 View  Result Date: 09/23/2019 CLINICAL DATA:  Hypoxic.  COVID test disorder. EXAM: PORTABLE CHEST 1 VIEW COMPARISON:  September 13, 2018 FINDINGS: The mediastinal contour and cardiac silhouette are normal. Increased pulmonary interstitium is identified bilaterally. Minimal left pleural effusion is identified. Surgical clips are projected over the right chest and right axilla. IMPRESSION: Increased pulmonary interstitium is identified bilaterally. This can be seen in viral infection or interstitial edema. Minimal left pleural effusion. Electronically Signed   By: Abelardo Diesel M.D.   On: 09/23/2019 14:48   ECHOCARDIOGRAM COMPLETE  Result Date: 09/25/2019    ECHOCARDIOGRAM REPORT   Patient Name:   Misty Edwards Date of Exam: 09/25/2019 Medical Rec #:   [id number]       Height:       63.0 in Accession #:    [id number]      Weight:       141.3 lb Date of Birth:  [birth year]       BSA:          1.668 m Patient Age:    73 years        BP:           144/43 mmHg Patient Gender: F               HR:           60 bpm. Exam Location:  [facility] Procedure: 2D Echo, Cardiac Doppler and Color Doppler Indications:    CHF-Acute Diastolic 099.83 / J82.50  History:        Patient has prior history of Echocardiogram examinations, most                 recent 09/06/2018. Previous Myocardial Infarction, Prior CABG;                 Risk Factors:Hypertension, Diabetes and Dyslipidemia.  Sonographer:    Alvino Chapel RCS Referring Phys: [place]  1. Distal septal hypokinesis. Left ventricular ejection fraction, by estimation, is 55%. The left ventricle has normal function. The left ventricle demonstrates regional wall motion abnormalities (see scoring diagram/findings for description). Left ventricular diastolic parameters were normal.  2. Right ventricular systolic function is normal. The right ventricular size is normal. There is normal pulmonary artery systolic pressure.  3. Left atrial size was mildly dilated.  4. The mitral valve is normal in structure. Mild mitral valve regurgitation.  5. The aortic valve is tricuspid. Aortic valve regurgitation is trivial. Mild to moderate aortic valve sclerosis/calcification is present, without any evidence of aortic stenosis. FINDINGS  Left Ventricle: Distal septal hypokinesis. Left ventricular ejection fraction, by estimation, is 55%. The left ventricle has normal function. The left ventricle demonstrates regional wall motion abnormalities. The left ventricular internal cavity size was normal in size. There is no left ventricular hypertrophy. Left ventricular diastolic parameters were normal. Right Ventricle: The right ventricular size is normal. No increase in right ventricular wall thickness. Right ventricular systolic  function is normal. There is normal pulmonary artery systolic pressure. The tricuspid regurgitant velocity is 2.15 m/s, and  with an assumed right atrial pressure of 3 mmHg, the estimated right ventricular systolic pressure is 53.9 mmHg. Left Atrium: Left atrial size was mildly dilated. Right Atrium: Right atrial size was normal in size. Pericardium: There is no evidence of pericardial effusion. Mitral Valve: The mitral valve is normal in structure.  There is mild thickening of the mitral valve leaflet(s). There is mild calcification of the mitral valve leaflet(s). Mild mitral annular calcification. Mild mitral valve regurgitation. Tricuspid Valve: The tricuspid valve is normal in structure. Tricuspid valve regurgitation is mild. Aortic Valve: The aortic valve is tricuspid. Aortic valve regurgitation is trivial. Aortic regurgitation PHT measures 609 msec. Mild to moderate aortic valve sclerosis/calcification is present, without any evidence of aortic stenosis. Pulmonic Valve: The pulmonic valve was normal in structure. Pulmonic valve regurgitation is trivial. Aorta: The aortic root is normal in size and structure. IAS/Shunts: No atrial level shunt detected by color flow Doppler.  LEFT VENTRICLE PLAX 2D LVIDd:         4.51 cm  Diastology LVIDs:         2.80 cm  LV e' lateral:   7.07 cm/s LV PW:         0.87 cm  LV E/e' lateral: 14.6 LV IVS:        0.93 cm  LV e' medial:    4.57 cm/s LVOT diam:     1.80 cm  LV E/e' medial:  22.5 LV SV:         79 LV SV Index:   47 LVOT Area:     2.54 cm  RIGHT VENTRICLE RV S prime:     11.10 cm/s TAPSE (M-mode): 1.8 cm LEFT ATRIUM             Index       RIGHT ATRIUM           Index LA diam:        3.90 cm 2.34 cm/m  RA Area:     14.00 cm LA Vol (A2C):   69.7 ml 41.78 ml/m RA Volume:   28.80 ml  17.26 ml/m LA Vol (A4C):   44.0 ml 26.37 ml/m LA Biplane Vol: 59.4 ml 35.61 ml/m  AORTIC VALVE LVOT Vmax:   130.00 cm/s LVOT Vmean:  88.600 cm/s LVOT VTI:    0.310 m AI PHT:      609  msec  AORTA Ao Root diam: 2.50 cm MITRAL VALVE                TRICUSPID VALVE MV Area (PHT): 3.58 cm     TR Peak grad:   18.5 mmHg MV Decel Time: 212 msec     TR Vmax:        215.00 cm/s MV E velocity: 103.00 cm/s MV A velocity: 84.40 cm/s   SHUNTS MV E/A ratio:  1.22         Systemic VTI:  0.31 m                             Systemic Diam: 1.80 cm Jenkins Rouge MD Electronically signed by Jenkins Rouge MD Signature Date/Time: 09/25/2019/12:32:41 PM    Final     Microbiology: Recent Results (from the past 240 hour(s))  Urine culture     Status: None   Collection Time: 09/23/19  6:26 PM   Specimen: Urine, Clean Catch  Result Value Ref Range Status   Specimen Description   Final    URINE, CLEAN CATCH Performed at [facility], [address] [place], [place] [address]    Special Requests   Final    NONE Performed at [facility], [address], [place], [place] [address]    Culture   Final    NO GROWTH Performed at  [facility], [place] [address], [place], [place] [address]    Report Status 09/25/2019 FINAL  Final  SARS Coronavirus 2 by RT PCR (hospital order, performed in [facility] hospital lab) Nasopharyngeal Nasopharyngeal Swab     Status: None   Collection Time: 09/23/19  9:35 PM   Specimen: Nasopharyngeal Swab  Result Value Ref Range Status   SARS Coronavirus 2 NEGATIVE NEGATIVE Final    Comment: (NOTE) SARS-CoV-2 target nucleic acids are NOT DETECTED.  The SARS-CoV-2 RNA is generally detectable in upper and lower respiratory specimens during the acute phase of infection. The lowest concentration of SARS-CoV-2 viral copies this assay can detect is 250 copies / mL. A negative result does not preclude SARS-CoV-2 infection and should not be used as the sole basis for treatment or other patient management decisions.  A negative result may occur with improper specimen collection / handling, submission of specimen other than nasopharyngeal swab, presence of viral mutation(s)  within the areas targeted by this assay, and inadequate number of viral copies (<250 copies / mL). A negative result must be combined with clinical observations, patient history, and epidemiological information.  Fact Sheet for Patients:   StrictlyIdeas.no  Fact Sheet for Healthcare Providers: BankingDealers.co.za  This test is not yet approved or  cleared by the [place] FDA and has been authorized for detection and/or diagnosis of SARS-CoV-2 by FDA under an Emergency Use Authorization (EUA).  This EUA will remain in effect (meaning this test can be used) for the duration of the COVID-19 declaration under Section 564(b)(1) of the Act, 21 U.S.C. section 360bbb-3(b)(1), unless the authorization is terminated or revoked sooner.  Performed at [facility], [address] [place], [place] [address]   MRSA PCR Screening     Status: None   Collection Time: 09/23/19 11:11 PM   Specimen: Nasopharyngeal  Result Value Ref Range Status   MRSA by PCR NEGATIVE NEGATIVE Final    Comment:        The GeneXpert MRSA Assay (FDA approved for NASAL specimens only), is one component of a comprehensive MRSA colonization surveillance program. It is not intended to diagnose MRSA infection nor to guide or monitor treatment for MRSA infections. Performed at [facility], [address] [place], [place] [address]      Labs: Basic Metabolic Panel: Recent Labs  Lab 09/23/19 1354 09/23/19 1356 09/24/19 0441 09/25/19 0607 09/26/19 0557  NA 129* 130* 131* 133* 130*  K 4.5 4.9 4.2 4.2 4.2  CL 95* 96* 93* 91* 90*  CO2 19*  --  26 27 26   GLUCOSE 258* 261* 169* 260* 433*  BUN 20 22 23  39* 46*  CREATININE 1.17* 1.20* 1.07* 1.61* 1.52*  CALCIUM 8.7*  --  8.9 9.1 9.4  MG 2.0  --   --   --   --    CBC: Recent Labs  Lab 09/23/19 1354 09/23/19 1356  WBC 12.7*  --   NEUTROABS 6.1  --   HGB 12.3 12.9  HCT 38.7 38.0  MCV 90.4  --   PLT 204   --    BNP (last 3 results) Recent Labs    09/23/19 1354  BNP 719.0*    CBG: Recent Labs  Lab 09/25/19 1422 09/25/19 1605 09/25/19 2036 09/26/19 0733 09/26/19 1111  GLUCAP 373* 275* 202* 420* 410*    Signed:  Barton Dubois MD.  [facility] 09/26/2019, 12:19 PM

## 2019-09-26 NOTE — Care Management Important Message (Signed)
Important Message  Patient Details  Name: Misty Edwards MRN: [id number] Date of Birth: November 12, 1926   Medicare Important Message Given:  Yes     [name] 09/26/2019, 2:22 PM

## 2019-09-26 NOTE — Progress Notes (Signed)
SATURATION QUALIFICATIONS: (This note is used to comply with regulatory documentation for home oxygen)  Patient Saturations on Room Air at Rest = 93%  Patient Saturations on Room Air while Ambulating = 89%  Patient Saturations on 2 Liters of oxygen while Ambulating = 95%  Please briefly explain why patient needs home oxygen:  Patient O2 saturation drops to 89% on RA. Patient exhibited no SOB.

## 2019-09-26 NOTE — Progress Notes (Signed)
Inpatient Diabetes Program Recommendations  AACE/ADA: New Consensus Statement on Inpatient Glycemic Control   Target Ranges:  Prepandial:   less than 140 mg/dL      Peak postprandial:   less than 180 mg/dL (1-2 hours)      Critically ill patients:  140 - 180 mg/dL   Results for Misty Edwards, Misty Edwards (MRN [id number]) as of 09/26/2019 07:31  Ref. Range 09/25/2019 07:54 09/25/2019 10:56 09/25/2019 14:22 09/25/2019 16:05 09/25/2019 20:36  Glucose-Capillary Latest Ref Range: 70 - 99 mg/dL 264 (H) 432 (H) 373 (H) 275 (H) 202 (H)   Review of Glycemic Control  Diabetes history: DM2 Outpatient Diabetes medications: Metformin 500 mg daily PRN, 70/30 25 units QAM, 70/30 15 units QHS Current orders for Inpatient glycemic control: Lantus 10 units daily, Novolog 0-9 units TID with meals  Inpatient Diabetes Program Recommendations:    Insulin: Please consider increasing Lantus to 20 units daily, adding Novolog 0-5 units QHS for bedtime correction, and ordering Novolog 5 units TID with meals for meal coverage if patient eats at least 50% of meals.  Thanks, Barnie Alderman, RN, MSN, CDE Diabetes Coordinator Inpatient Diabetes Program [phone] (Team Pager from 8am to [place])  .

## 2019-09-26 NOTE — Progress Notes (Addendum)
CBG 420.  Dr. Dyann Kief notified.  Ordered 17 units Novolog.  Will give as ordered.

## 2019-09-26 NOTE — TOC Transition Note (Signed)
Transition of Care [facility]) - CM/SW Discharge Note  Patient Details  Name: Misty Edwards MRN: [id number] Date of Birth: 1926/05/30  [facility] CM/SW Contact:  Sherie Don, LCSW Phone Number: 09/26/2019, 10:40 AM  Clinical Narrative: Per O2 sat note, patient does not meet Medicare's criteria for home O2 so O2 will not be set up at this time. No other needs anticipated. TOC signing off.  Final next level of care: Home/Self Care Barriers to Discharge: Barriers Resolved  Patient Goals and CMS Choice Patient states their goals for this hospitalization and ongoing recovery are:: Return home CMS Medicare.gov Compare Post Acute Care list provided to:: Patient Choice offered to / list presented to : Patient  Discharge Plan and Services         DME Arranged: N/A DME Agency: NA HH Arranged: NA HH Agency: NA  Readmission Risk Interventions Readmission Risk Prevention Plan 09/23/2018  Transportation Screening Complete  PCP or Specialist Appt within 3-5 Days Complete  HRI or Home Care Consult Complete  Social Work Consult for [place] Planning/Counseling Complete  Palliative Care Screening Complete  Medication Review Press photographer) Complete  Some recent data might be hidden

## 2019-09-27 ENCOUNTER — Telehealth: Payer: Self-pay | Admitting: Physician Assistant

## 2019-09-27 NOTE — Telephone Encounter (Signed)
Pt daughter called stating she had a weight gain tonight. We discussed recording morning weights. She weighed 141 lbs this morning. Yesterday when she got home from the hospital she weighed 136 lbs, which was not a morning weight (dry weight is not entirely clear). She is not short of breath and denies lower extremity swelling and orthopnea. I advised to weigh again in the morning. If she has had a weight gain of 3-5 lbs from the 141 lbs weight observed this mornign, to call the office for further instruction given her renal disease. Of note, she was 141 lbs the day before discharge (question accuracy of discharge day weight - 150 lbs). We reviewed ER precautions. She expressed understanding of the plan.

## 2019-09-28 ENCOUNTER — Other Ambulatory Visit: Payer: Self-pay

## 2019-09-28 MED ORDER — NOVOLIN 70/30 RELION (70-30) 100 UNIT/ML ~~LOC~~ SUSP: SUBCUTANEOUS | 2 refills | Status: DC

## 2019-09-28 NOTE — Telephone Encounter (Signed)
I agree with your assessment and plan  J Caddie Randle MD 

## 2019-09-29 ENCOUNTER — Telehealth: Payer: Self-pay | Admitting: Cardiology

## 2019-09-29 NOTE — Telephone Encounter (Signed)
Daughter states Pt has recently been SOB. Daughter is waiting for a return call from PMD.   Please call Daughter [phone]   Thanks renee

## 2019-09-30 ENCOUNTER — Other Ambulatory Visit: Payer: Self-pay

## 2019-09-30 MED ORDER — PEN NEEDLES 31G X 6 MM MISC: 0 refills | Status: DC

## 2019-09-30 MED ORDER — "INSULIN SYRINGE-NEEDLE U-100 31G X 5/16"" 0.5 ML MISC": 25.0000 [IU] | 0 refills | Status: DC

## 2019-09-30 NOTE — Telephone Encounter (Signed)
Daughter notified 

## 2019-09-30 NOTE — Telephone Encounter (Signed)
Daughter wants to know why lisinopril was stopped at hospital discharge. Metformin was stopped due to renal function and now blood glucose is in the 400's.She has placed a call to Dr.Pickard

## 2019-09-30 NOTE — Telephone Encounter (Signed)
Lisinopril was stopped due to worsening kidney function during the admission. Would need to discuss diabetes meds with pcp   Zandra Abts MD

## 2019-10-03 ENCOUNTER — Other Ambulatory Visit: Payer: Self-pay | Admitting: Family Medicine

## 2019-10-03 NOTE — Telephone Encounter (Signed)
CB # Medication was sent to the pharmacy incorrect. The syring should have been 50 unit instead of 100 unit also the  One Touch Delica Plus wasn't  to sent the pharmacy along with strips  Need to be fill also

## 2019-10-04 ENCOUNTER — Encounter: Payer: Self-pay | Admitting: Family Medicine

## 2019-10-04 ENCOUNTER — Other Ambulatory Visit: Payer: Self-pay

## 2019-10-04 ENCOUNTER — Ambulatory Visit (INDEPENDENT_AMBULATORY_CARE_PROVIDER_SITE_OTHER): Payer: Medicare HMO | Admitting: Family Medicine

## 2019-10-04 VITALS — BP 130/68 | HR 70 | Temp 98.1°F | Resp 16 | Ht 63.0 in | Wt 141.0 lb

## 2019-10-04 DIAGNOSIS — Z794 Long term (current) use of insulin: Secondary | ICD-10-CM | POA: Diagnosis not present

## 2019-10-04 DIAGNOSIS — N179 Acute kidney failure, unspecified: Secondary | ICD-10-CM | POA: Diagnosis not present

## 2019-10-04 DIAGNOSIS — E1169 Type 2 diabetes mellitus with other specified complication: Secondary | ICD-10-CM

## 2019-10-04 DIAGNOSIS — I10 Essential (primary) hypertension: Secondary | ICD-10-CM | POA: Diagnosis not present

## 2019-10-04 DIAGNOSIS — R69 Illness, unspecified: Secondary | ICD-10-CM | POA: Diagnosis not present

## 2019-10-04 DIAGNOSIS — I5031 Acute diastolic (congestive) heart failure: Secondary | ICD-10-CM

## 2019-10-04 DIAGNOSIS — Z09 Encounter for follow-up examination after completed treatment for conditions other than malignant neoplasm: Secondary | ICD-10-CM

## 2019-10-04 DIAGNOSIS — J810 Acute pulmonary edema: Secondary | ICD-10-CM

## 2019-10-04 MED ORDER — BLOOD GLUCOSE TEST VI STRP
ORAL_STRIP | 3 refills | Status: DC
Start: 2019-10-04 — End: 2019-10-04

## 2019-10-04 MED ORDER — "INSULIN SYRINGE-NEEDLE U-100 31G X 5/16"" 0.5 ML MISC": 3 refills | Status: DC

## 2019-10-04 MED ORDER — RELION PEN NEEDLES 31G X 6 MM MISC: 0 refills | Status: DC

## 2019-10-04 MED ORDER — BLOOD GLUCOSE TEST VI STRP: ORAL_STRIP | 3 refills | Status: DC

## 2019-10-04 NOTE — Telephone Encounter (Signed)
Prescription sent to pharmacy.

## 2019-10-04 NOTE — Progress Notes (Signed)
Subjective:    Patient ID: Misty Edwards, female    DOB: [birth year], 86 y.o.   MRN: [id number]  HPI   Patient was recently admitted with hypoxic respiratory failure due to pulmonary edema.  I have copied the DC summary below for my reference: Admit date: 09/23/2019 Discharge date: 09/26/2019  Recommendations for Outpatient Follow-up:  Repeat basic metabolic panel to evaluate lites and renal function Reassess patient CBGs with further adjustment to hypoglycemic regimen as needed. Reassess blood pressure and further adjust antihypertensive regimen as required.  Discharge Diagnoses:  Active Problems:   Essential hypertension   Chronic pancreatitis (HCC)   Dyslipidemia   Acute on chronic diastolic CHF (congestive heart failure) (HCC)   Acute respiratory failure with hypoxia (HCC)   Acute respiratory failure (HCC)   Acute pulmonary edema ([place])   Discharge Condition: Stable and improved.  Discharged home with instruction to follow-up with PCP and cardiology service.       Filed Weights   09/23/19 2326 09/24/19 1847 09/26/19 0555  Weight: 65.1 kg 64.1 kg 68.2 kg    History of present illness:  As per H&P written by [name] on 09/23/2019 86 year old female with past medical history significant of type 2 diabetes, coronary disease status post CABG in 2016, hypertension, hyperlipidemia, chronic diastolic heart failure and chronic pancreatitis; who presented to the hospital secondary to shortness of breath and swelling. Patient found with acute respiratory failure with hypoxia in the setting of flash pulmonary edema and vascular congestion due to acute on chronic diastolic heart failure. Patient ended requiring BiPAP at time of admission secondary to ongoing hypoxia (desaturation 82% on presentation).  Hospital Course:  1-Acute respiratory failure with hypoxemia: In the setting of acute on chronic diastolic CHF. -Ended requiring BiPAP at time of admission; now without  oxygen supplementation and good O2 sat. -no frank crackles and reporting no orthopnea. -Continue daily weights and low sodium diet -discharge on lasix 40 mg daily -Repeat echo: see results below.  See ejection fraction, no significant valvular disorder.  Unable to evaluate diastolic dysfunction at this time  2-Essential hypertension -Stable currently; initially presented with hypertensive urgency. -continue current management with adjustments reassess VS at follow visit -heart healthy diet encouraged.   3-Chronic pancreatitis (HCC) -No abdominal pain -Continue to follow heart healthy diet -Continue Creon.  4-Dyslipidemia -Continue statins.  5-initially with urinary retention -Bladder scan with 400 mL inside her bladder; in and out cath x1 provided -patient on adjusted dose of lasix now and voiding without problem  6-type 2 diabetes mellitus with nephropathy -Continue to follow CBGs and further adjust hypoglycemic regimen as needed. -Modified carbohydrate diet encouraged.  7-Acute on chronic kidney disease stage IIIb -In the setting of acute on chronic heart failure and decreased perfusion. -Improved and appropriate trending down -Given patient's chronic kidney disease Metformin has been discontinued discharge. -Advised of antibiotic for hydration -Repeat basic metabolic panel to follow renal function trend at follow-up visit.  8-hyponatremia -In the setting of delutionaleffect from fluid overload -Repeat basic metabolic panel follow-up visit to reassess electrolytes trend and stability.  Procedures:  2-D echo: 1. Distal septal hypokinesis. Left ventricular ejection fraction, by  estimation, is 55%. The left ventricle has normal function. The left  ventricle demonstrates regional wall motion abnormalities (see scoring  diagram/findings for description). Left  ventricular diastolic parameters were normal.  2. Right ventricular systolic function is normal. The  right ventricular  size is normal. There is normal pulmonary artery systolic pressure.  3. Left  atrial size was mildly dilated.  4. The mitral valve is normal in structure. Mild mitral valve  regurgitation.  5. The aortic valve is tricuspid. Aortic valve regurgitation is trivial.  Mild to moderate aortic valve sclerosis/calcification is present, without  any evidence of aortic stenosis.    10/04/19  Patient previously had been taking Lasix 20 mg a day.  She had been out of the medication for 2 days prior to her hospital admission.  Therefore she has had no diuretic for 48 hours.  They were also having several family gatherings due to the loss of a loved one.  During that time she was eating an excessive amount of sodium including [place] fries and pizza.  The day that she went to the hospital she was complaining of shortness of breath and leg swelling.  She became hypoxic and confused prompting them to call 911.  Since discharge from the hospital, the patient states she has been doing fine.  She is taking the higher dose Lasix but they are concerned that she may be getting dehydrated.  She reports feeling lightheaded.  There is no pitting edema in her legs.  She denies any cough.  She denies any pleurisy.  She denies any orthopnea.  She denies any paroxysmal nocturnal dyspnea.  She denies any chest pressure.  She stopped Metformin due to her renal insufficiency.  Her hemoglobin A1c prior to going to the hospital was 6.4.  She has been taking insulin, 25 units in the morning and 15 units in the evening.  Her blood sugars at all been less than 200 prior to her hospitalization.  Due to her renal insufficiency they held Metformin and now her sugars are elevated.  Her sugar this morning was over 200.  She denies any hypoglycemic episodes. Past Medical History:  Diagnosis Date  . Acute biliary pancreatitis 07/2002   thia was in 07/2004:she still has pseudocyst in tail of pancreas measuring 54 x 33 mm   .  Adrenal adenoma    bilateral  . Anxiety   . Chronic pancreatitis ([place])   . Detached retina   . Diabetes mellitus ([place])   . Diverticulosis   . History of carcinoma in situ of breast 1988  . Hyperlipidemia   . Hypertension   . IBS (irritable bowel syndrome)   . Legally blind   . Osteoarthritis   . Osteopenia   . Upper GI bleed September2004   secondary to gastritis   Past Surgical History:  Procedure Laterality Date  . CARDIAC CATHETERIZATION N/A 08/24/2014   Procedure: Left Heart Cath and Coronary Angiography;  Surgeon: Leonie Man, MD;  Location: [facility];  Service: Cardiovascular;  Laterality: N/A;  . COLONOSCOPY  08/15/05   few tiny diverticula at sigmoid colon/external hemorrhoids but no polyps  . COLONOSCOPY  01/08/2012   SWF:UXNATFT diverticulosis. Next colonoscopy in 12/2016  . CORONARY ARTERY BYPASS GRAFT N/A 08/24/2014   Procedure: CORONARY ARTERY BYPASS GRAFTING (CABG) x three, using left internal mammary artery and right leg greater saphenous vein harvested endoscopically;  Surgeon: Ivin Poot, MD;  Location: [facility];  Service: Open Heart Surgery;  Laterality: N/A;  . ECTOPIC PREGNANCY SURGERY  1950's  . ERCP with sphincterotomy  07/2002  . ESOPHAGOGASTRODUODENOSCOPY      Gastritis of body.  Otherwise normal  . ESOPHAGOGASTRODUODENOSCOPY  01/08/2012   RMR: Few scattered gastric erosions of uncertain significance-status post biopsy. Minimal chronic inflammation, no H.pylori  . LAPAROSCOPIC CHOLECYSTECTOMY    . PANCREATIC  PSEUDOCYST DRAINAGE  [id number]   drained percutaneously   . right mastectomy    . tacking up of her bladder    . TEE WITHOUT CARDIOVERSION N/A 08/24/2014   Procedure: TRANSESOPHAGEAL ECHOCARDIOGRAM (TEE);  Surgeon: Ivin Poot, MD;  Location: [facility];  Service: Open Heart Surgery;  Laterality: N/A;   Current Outpatient Medications on File Prior to Visit  Medication Sig Dispense Refill  . amLODipine (NORVASC) 5 MG tablet Take 1 tablet by  mouth once daily (Patient taking differently: Take 5 mg by mouth at bedtime. ) 90 tablet 3  . aspirin EC 81 MG EC tablet Take 1 tablet (81 mg total) by mouth daily. (Patient taking differently: Take 81 mg by mouth every morning. )    . brimonidine-timolol (COMBIGAN) 0.2-0.5 % ophthalmic solution Place 1 drop into both eyes every 12 (twelve) hours as needed.     . calcium carbonate (TUMS - DOSED IN MG ELEMENTAL CALCIUM) 500 MG chewable tablet Chew 3 tablets by mouth at bedtime.    Marland Kitchen CREON 24000-76000 units CPEP TAKE 2 CAPSULES BY MOUTH WITH MEALS AND 1 CAP WITH SNACKS x 2 (Patient taking differently: Take 1-2 capsules by mouth 5 (five) times daily. Patient takes 2 capsules with meals and 1 capsule with snacks) 300 capsule 11  . cycloSPORINE (RESTASIS) 0.05 % ophthalmic emulsion Place 1 drop into both eyes 3 (three) times daily as needed.     Noelle Penner ALLERGY RELIEF, CETIRIZINE, 10 MG tablet Take 1 tablet by mouth once daily (Patient taking differently: Take 10 mg by mouth at bedtime. ) 90 tablet 0  . feeding supplement, ENSURE ENLIVE, (ENSURE ENLIVE) LIQD Take 237 mLs by mouth 2 (two) times daily between meals. 237 mL 12  . ferrous sulfate 325 (65 FE) MG tablet Take 325 mg by mouth daily with breakfast.    . FLUoxetine (PROZAC) 20 MG tablet Take 1 tablet by mouth once daily (Patient taking differently: Take 20 mg by mouth at bedtime. ) 90 tablet 3  . furosemide (LASIX) 40 MG tablet Take 1 tablet (40 mg total) by mouth daily. 30 tablet 2  . gabapentin (NEURONTIN) 100 MG capsule TAKE 1 CAPSULE BY MOUTH THREE TIMES DAILY AS NEEDED FOR  NERVE  PAIN (Patient taking differently: Take 100 mg by mouth at bedtime. ) 90 capsule 0  . Glucose Blood (BLOOD GLUCOSE TEST STRIPS) STRP Please dispense based on patient and insurance preference. Use as directed to monitor FSBS 2x daily. Dx: E11.9. 100 strip 3  . insulin NPH-regular Human (NOVOLIN 70/30 RELION) (70-30) 100 UNIT/ML injection INJECT 25 UNITS SUBCUTANEOUSLY IN THE  MORNING AND 15 IN THE EVENING 10 mL 2  . LORazepam (ATIVAN) 1 MG tablet TAKE 1/2 TO 1 (ONE-HALF TO ONE) TABLET BY MOUTH TWICE DAILY AS NEEDED (Patient taking differently: Take 1 mg by mouth at bedtime. ) 30 tablet 0  . magnesium oxide (MAG-OX) 400 (241.3 Mg) MG tablet Take 1 tablet by mouth once daily (Patient taking differently: Take 400 mg by mouth daily. ) 90 tablet 3  . metoprolol tartrate (LOPRESSOR) 25 MG tablet Take 1 tablet by mouth twice daily (Patient taking differently: Take 25 mg by mouth 2 (two) times daily. ) 180 tablet 0  . Multiple Vitamin (MULTIVITAMIN) capsule Take 1 capsule by mouth every morning.     . pantoprazole (PROTONIX) 40 MG tablet Take 1 tablet by mouth once daily (Patient taking differently: Take 40 mg by mouth in the morning. ) 90 tablet 1  .  polyethylene glycol (MIRALAX / GLYCOLAX) 17 g packet Take 17 g by mouth daily. (Patient taking differently: Take 17 g by mouth daily as needed for mild constipation or moderate constipation. ) 14 each 0  . potassium chloride (KLOR-CON) 10 MEQ tablet TAKE 1 TABLET BY MOUTH ONCE DAILY AS NEEDED. TAKE WITH FUROSEMIDE. (Patient taking differently: Take 10 mEq by mouth daily. ) 90 tablet 3  . RELION PEN NEEDLES 31G X 6 MM MISC Use as directed to inject insulin SQ BID 500 each 0  . rosuvastatin (CRESTOR) 5 MG tablet Take 1 tablet by mouth once daily (Patient taking differently: Take 5 mg by mouth at bedtime. ) 90 tablet 3  . triamcinolone cream (KENALOG) 0.1 % APPLY  CREAM EXTERNALLY TWICE DAILY (Patient taking differently: Apply 1 application topically as needed. ) 60 g 1  . Wheat Dextrin (BENEFIBER DRINK MIX) PACK Take 1 Package by mouth daily as needed (for supplement-digestion).      No current facility-administered medications on file prior to visit.   Allergies  Allergen Reactions  . Calcium-Containing Compounds Nausea Only  . Morphine And Related Other (See Comments)    Hallucination  . Raloxifene Itching    Evista- Face and  eyes burning  . Vitamin D Analogs Nausea Only   Social History   Socioeconomic History  . Marital status: Widowed    Spouse name: Not on file  . Number of children: 5  . Years of education: Not on file  . Highest education level: Not on file  Occupational History  . Occupation: homemaker  Tobacco Use  . Smoking status: Never Smoker  . Smokeless tobacco: Never Used  Vaping Use  . Vaping Use: Never used  Substance and Sexual Activity  . Alcohol use: No    Alcohol/week: 0.0 standard drinks  . Drug use: No  . Sexual activity: Not Currently    Birth control/protection: Post-menopausal  Other Topics Concern  . Not on file  Social History Narrative   Lives in [place].   Social Determinants of Health   Financial Resource Strain:   . Difficulty of Paying Living Expenses:   Food Insecurity:   . Worried About Charity fundraiser in the Last Year:   . Arboriculturist in the Last Year:   Transportation Needs:   . Film/video editor (Medical):   Marland Kitchen Lack of Transportation (Non-Medical):   Physical Activity:   . Days of Exercise per Week:   . Minutes of Exercise per Session:   Stress:   . Feeling of Stress :   Social Connections:   . Frequency of Communication with Friends and Family:   . Frequency of Social Gatherings with Friends and Family:   . Attends Religious Services:   . Active Member of Clubs or Organizations:   . Attends Archivist Meetings:   Marland Kitchen Marital Status:   Intimate Partner Violence:   . Fear of Current or Ex-Partner:   . Emotionally Abused:   Marland Kitchen Physically Abused:   . Sexually Abused:          Review of Systems  All other systems reviewed and are negative.      Objective:   Physical Exam Constitutional:      Appearance: Normal appearance.  Cardiovascular:     Rate and Rhythm: Normal rate and regular rhythm.     Heart sounds: Normal heart sounds. No murmur heard.   Pulmonary:     Effort: Pulmonary effort is normal. No  respiratory distress.     Breath sounds: Normal breath sounds. No stridor. No wheezing, rhonchi or rales.  Chest:     Chest wall: No tenderness.  Abdominal:     General: Abdomen is flat. Bowel sounds are normal. There is no distension.     Palpations: Abdomen is soft. There is no mass.     Tenderness: There is no abdominal tenderness.  Musculoskeletal:        General: Tenderness present.     Right lower leg: No edema.     Left lower leg: No edema.  Skin:    Findings: No erythema or rash.  Neurological:     General: No focal deficit present.     Mental Status: She is alert and oriented to person, place, and time. Mental status is at baseline.     Cranial Nerves: No cranial nerve deficit.     Sensory: No sensory deficit.     Motor: Weakness present.     Coordination: Coordination normal.           Assessment & Plan:  Hospital discharge follow-up - Plan: CBC with Differential/Platelet, COMPLETE METABOLIC PANEL WITH GFR  Type 2 diabetes mellitus with other specified complication, with long-term current use of insulin (HCC)  AKI (acute kidney injury) ([place]) - Plan: CBC with Differential/Platelet, COMPLETE METABOLIC PANEL WITH GFR  Essential hypertension - Plan: CBC with Differential/Platelet, COMPLETE METABOLIC PANEL WITH GFR  Acute pulmonary edema (HCC) - Plan: CBC with Differential/Platelet, COMPLETE METABOLIC PANEL WITH GFR  Acute congestive heart failure with left ventricular diastolic dysfunction ([place])  I believe the patient's acute pulmonary edema secondary to diastolic dysfunction was due to missing the dose of the diuretic and increased salt consumption.  Therefore I believe she most likely just needs to be on 20 mg of Lasix moving forward.  Today there is no evidence of fluid overload.  I will check a CMP and a CBC.  If there is evidence of prerenal azotemia, I would likely decrease her Lasix to 20 mg a day is a chronic long-term dose.  The patient had only been using  Metformin as needed.  I explained to the family that this does not work as intended.  Likely the Metformin was causing little or if any benefit to controlling her sugars.  Therefore we will simply monitor her fasting blood sugars and 2-hour postprandial sugars over the next week to 2 weeks.  Ideally I like her blood sugars to be between 100-200.  If not we will gradually uptitrate her insulin accordingly.  They will call me in 1 to 2 weeks with her sugar readings.  Otherwise plan on rechecking in 3 months.

## 2019-10-05 LAB — CBC WITH DIFFERENTIAL/PLATELET
Absolute Monocytes: 558 cells/uL (ref 200–950)
Basophils Absolute: 37 cells/uL (ref 0–200)
Basophils Relative: 0.6 %
Eosinophils Absolute: 99 cells/uL (ref 15–500)
Eosinophils Relative: 1.6 %
HCT: 35.6 % (ref 35.0–45.0)
Hemoglobin: 11.9 g/dL (ref 11.7–15.5)
Lymphs Abs: 1662 cells/uL (ref 850–3900)
MCH: 28.8 pg (ref 27.0–33.0)
MCHC: 33.4 g/dL (ref 32.0–36.0)
MCV: 86.2 fL (ref 80.0–100.0)
MPV: 11.2 fL (ref 7.5–12.5)
Monocytes Relative: 9 %
Neutro Abs: 3844 cells/uL (ref 1500–7800)
Neutrophils Relative %: 62 %
Platelets: 182 10*3/uL (ref 140–400)
RBC: 4.13 10*6/uL (ref 3.80–5.10)
RDW: 13.1 % (ref 11.0–15.0)
Total Lymphocyte: 26.8 %
WBC: 6.2 10*3/uL (ref 3.8–10.8)

## 2019-10-05 LAB — COMPLETE METABOLIC PANEL WITH GFR
AG Ratio: 1.7 (calc) (ref 1.0–2.5)
ALT: 21 U/L (ref 6–29)
AST: 24 U/L (ref 10–35)
Albumin: 4.7 g/dL (ref 3.6–5.1)
Alkaline phosphatase (APISO): 48 U/L (ref 37–153)
BUN/Creatinine Ratio: 27 (calc) — ABNORMAL HIGH (ref 6–22)
BUN: 31 mg/dL — ABNORMAL HIGH (ref 7–25)
CO2: 27 mmol/L (ref 20–32)
Calcium: 10 mg/dL (ref 8.6–10.4)
Chloride: 89 mmol/L — ABNORMAL LOW (ref 98–110)
Creat: 1.14 mg/dL — ABNORMAL HIGH (ref 0.60–0.88)
GFR, Est African American: 49 mL/min/{1.73_m2} — ABNORMAL LOW (ref >=60)
GFR, Est Non African American: 42 mL/min/{1.73_m2} — ABNORMAL LOW (ref >=60)
Globulin: 2.8 g/dL (calc) (ref 1.9–3.7)
Glucose, Bld: 191 mg/dL — ABNORMAL HIGH (ref 65–99)
Potassium: 4.7 mmol/L (ref 3.5–5.3)
Sodium: 128 mmol/L — ABNORMAL LOW (ref 135–146)
Total Bilirubin: 0.8 mg/dL (ref 0.2–1.2)
Total Protein: 7.5 g/dL (ref 6.1–8.1)

## 2019-10-06 ENCOUNTER — Other Ambulatory Visit: Payer: Self-pay | Admitting: *Deleted

## 2019-10-06 DIAGNOSIS — E871 Hypo-osmolality and hyponatremia: Secondary | ICD-10-CM

## 2019-10-07 ENCOUNTER — Other Ambulatory Visit: Payer: Self-pay

## 2019-10-07 MED ORDER — ONETOUCH DELICA PLUS LANCET33G MISC: 1 refills | Status: DC

## 2019-10-10 ENCOUNTER — Other Ambulatory Visit: Payer: Self-pay | Admitting: Family Medicine

## 2019-10-10 ENCOUNTER — Other Ambulatory Visit: Payer: Medicare HMO

## 2019-10-10 ENCOUNTER — Telehealth: Payer: Self-pay | Admitting: Family Medicine

## 2019-10-10 ENCOUNTER — Other Ambulatory Visit: Payer: Self-pay

## 2019-10-10 DIAGNOSIS — E871 Hypo-osmolality and hyponatremia: Secondary | ICD-10-CM

## 2019-10-10 DIAGNOSIS — N904 Leukoplakia of vulva: Secondary | ICD-10-CM

## 2019-10-10 DIAGNOSIS — R69 Illness, unspecified: Secondary | ICD-10-CM | POA: Diagnosis not present

## 2019-10-10 NOTE — Telephone Encounter (Signed)
CB# [phone] Lancets, also need test strips

## 2019-10-11 ENCOUNTER — Other Ambulatory Visit: Payer: Self-pay

## 2019-10-11 DIAGNOSIS — N904 Leukoplakia of vulva: Secondary | ICD-10-CM

## 2019-10-11 LAB — BASIC METABOLIC PANEL
BUN/Creatinine Ratio: 23 (calc) — ABNORMAL HIGH (ref 6–22)
BUN: 30 mg/dL — ABNORMAL HIGH (ref 7–25)
CO2: 26 mmol/L (ref 20–32)
Calcium: 9.7 mg/dL (ref 8.6–10.4)
Chloride: 97 mmol/L — ABNORMAL LOW (ref 98–110)
Creat: 1.28 mg/dL — ABNORMAL HIGH (ref 0.60–0.88)
Glucose, Bld: 139 mg/dL — ABNORMAL HIGH (ref 65–99)
Potassium: 4.5 mmol/L (ref 3.5–5.3)
Sodium: 135 mmol/L (ref 135–146)

## 2019-10-11 MED ORDER — ONETOUCH DELICA PLUS LANCET33G MISC: 1 refills | Status: DC

## 2019-10-11 MED ORDER — BLOOD GLUCOSE TEST VI STRP: ORAL_STRIP | 3 refills | Status: DC

## 2019-10-11 MED ORDER — TRIAMCINOLONE ACETONIDE 0.1 % EX CREA: TOPICAL_CREAM | CUTANEOUS | 0 refills | Status: DC

## 2019-10-11 NOTE — Telephone Encounter (Signed)
Spoke with Pt daughter, lancets and strips were sent in to pharmacy.

## 2019-10-13 ENCOUNTER — Other Ambulatory Visit: Payer: Self-pay

## 2019-10-13 MED ORDER — LANCETS MISC. MISC: 3 refills | Status: AC

## 2019-10-13 MED ORDER — GLUCOSE BLOOD VI STRP: ORAL_STRIP | 3 refills | Status: DC

## 2019-10-13 MED ORDER — LANCETS MISC. MISC: 3 refills | Status: DC

## 2019-10-14 ENCOUNTER — Telehealth: Payer: Self-pay

## 2019-10-14 NOTE — Telephone Encounter (Signed)
Spoke with pt's daughter Misty Edwards and she would like a referral for [facility] to help pt with diabetic supplies.Medicare has stopped paying for the 5x a day of checking bs and will only pay up to 3x a day at retail pharmacies. Please advise.

## 2019-10-16 ENCOUNTER — Other Ambulatory Visit: Payer: Self-pay | Admitting: Family Medicine

## 2019-10-17 ENCOUNTER — Other Ambulatory Visit: Payer: Self-pay

## 2019-10-17 ENCOUNTER — Telehealth: Payer: Self-pay

## 2019-10-17 DIAGNOSIS — E1169 Type 2 diabetes mellitus with other specified complication: Secondary | ICD-10-CM

## 2019-10-17 NOTE — Telephone Encounter (Signed)
Ok to refill??  Last office visit 10/14/2019.  Last refill 09/16/2019.

## 2019-10-17 NOTE — Telephone Encounter (Signed)
Error

## 2019-10-17 NOTE — Telephone Encounter (Signed)
[name] notified. She wanted [facility] referral. It has been placed.

## 2019-10-17 NOTE — Telephone Encounter (Signed)
I am fine with [facility] referral but I don't think they will help with diabetic testing supplies. I am fine with her just check sugars 3 times a day at meal times.

## 2019-10-18 ENCOUNTER — Telehealth: Payer: Self-pay | Admitting: Family Medicine

## 2019-10-18 NOTE — Telephone Encounter (Signed)
Pt is retaining fluid in her feet. She is on 20 mg and another 10 mg so she has taken 30mg  Sunday, 20 mg Monday and she has took 40 mg. Pt daughter is wanting to know if she needs to go back to 40mg  temporary or what dosage does she need to take? Pt is drinking 10 floz a day as well.   FLUoxetine (PROZAC) 20 MG tablet    Call back [place] (daughter)

## 2019-10-19 ENCOUNTER — Other Ambulatory Visit: Payer: Self-pay

## 2019-10-19 MED ORDER — GLUCOSE BLOOD VI STRP: ORAL_STRIP | 3 refills | Status: DC

## 2019-10-19 NOTE — Telephone Encounter (Signed)
Linda from [facility] call let Dr.Pickard know there not able to see the pt due to no staff avaiable

## 2019-10-21 ENCOUNTER — Other Ambulatory Visit: Payer: Self-pay

## 2019-10-21 ENCOUNTER — Ambulatory Visit: Payer: Medicare HMO | Admitting: Student

## 2019-10-21 ENCOUNTER — Encounter: Payer: Self-pay | Admitting: Student

## 2019-10-21 VITALS — BP 132/60 | HR 59 | Ht 63.0 in | Wt 141.0 lb

## 2019-10-21 DIAGNOSIS — I5032 Chronic diastolic (congestive) heart failure: Secondary | ICD-10-CM | POA: Diagnosis not present

## 2019-10-21 DIAGNOSIS — I2510 Atherosclerotic heart disease of native coronary artery without angina pectoris: Secondary | ICD-10-CM

## 2019-10-21 DIAGNOSIS — E782 Mixed hyperlipidemia: Secondary | ICD-10-CM

## 2019-10-21 DIAGNOSIS — I340 Nonrheumatic mitral (valve) insufficiency: Secondary | ICD-10-CM

## 2019-10-21 DIAGNOSIS — Z79899 Other long term (current) drug therapy: Secondary | ICD-10-CM

## 2019-10-21 DIAGNOSIS — I10 Essential (primary) hypertension: Secondary | ICD-10-CM | POA: Diagnosis not present

## 2019-10-21 MED ORDER — FUROSEMIDE 20 MG PO TABS: 30.0000 mg | ORAL_TABLET | Freq: Every day | ORAL | 3 refills | Status: DC

## 2019-10-21 NOTE — Progress Notes (Signed)
Cardiology Office Note    Date:  10/22/2019   ID:  Misty Edwards, DOB 1926-08-24, MRN [id number]  PCP:  Susy Frizzle, MD  Cardiologist: Carlyle Dolly, MD    Chief Complaint  Patient presents with  . Hospitalization Follow-up    History of Present Illness:    Misty Edwards is a 86 y.o. female with past medical history of CAD (s/p CABG in 08/2014 with LIMA-LAD, SVG-OM and SVG-PDA), HTN, HLD, moderate MR and Stage 3 CKD who presents to the office today for hospital follow-up.   She most recently had a phone visit with Dr. Harl Bowie in 10/2018 and was overall doing well at that time. Was continued on her current medication regimen with ASA 81mg  daily, Amlodipine 5 mg daily and Crestor 5mg  daily. It was recommended to be lenient with her BP goal given frequent falls and prior orthostasis.   In the interim, she was admitted to [facility] from 7/9 - 09/26/2019 for acute hypoxic respiratory failure in the setting of a CHF exacerbation. Initially required BiPAP but respiratory status improved with diuresis. BNP had been elevated to 719 on admission. Repeat echo showed septal HK with a preserved EF of 55%. RV function was normal and she did have mild MR and aortic valve sclerosis without stenosis. BMET on 09/26/2019 showed Na+ was low at 130 with K+ at 4.2 and creatinine at 1.52. Was discharged on Lasix 40mg  daily.   She did have repeat labs with her PCP on 10/04/2019 and creatinine had improved to 1.14 but Lasix was reduced to 20mg  daily due to her Na+ being low at 128. Repeat on 10/10/2019 showed creatinine had trended upwards to 1.28 but Na+ had normalized to 135.  In talking with the patient and her daughter today, she reports overall doing well since her hospitalization. She has experienced occasional episodes of lower extremity edema and has taken Lasix 40 mg daily. Over the past several days she has been taking 30 mg daily. She reports her breathing has been stable and denies any worsening  dyspnea, orthopnea or PND. No recent chest pain or palpitations.  She reports that initially after her hospitalization she was consuming over 100 ounces of fluid on a daily basis but has reduced this to 60 ounces daily.  Past Medical History:  Diagnosis Date  . Acute biliary pancreatitis 07/2002   thia was in 07/2004:she still has pseudocyst in tail of pancreas measuring 54 x 33 mm   . Adrenal adenoma    bilateral  . Anxiety   . CAD (coronary artery disease)    a. s/p CABG in 08/2014 with LIMA-LAD, SVG-OM and SVG-PDA  . Chronic pancreatitis ([place])   . Detached retina   . Diabetes mellitus ([place])   . Diverticulosis   . History of carcinoma in situ of breast 1988  . Hyperlipidemia   . Hypertension   . IBS (irritable bowel syndrome)   . Legally blind   . Osteoarthritis   . Osteopenia   . Upper GI bleed September2004   secondary to gastritis    Past Surgical History:  Procedure Laterality Date  . CARDIAC CATHETERIZATION N/A 08/24/2014   Procedure: Left Heart Cath and Coronary Angiography;  Surgeon: Leonie Man, MD;  Location: [facility];  Service: Cardiovascular;  Laterality: N/A;  . COLONOSCOPY  08/15/05   few tiny diverticula at sigmoid colon/external hemorrhoids but no polyps  . COLONOSCOPY  01/08/2012   [id number] diverticulosis. Next colonoscopy in 12/2016  .  CORONARY ARTERY BYPASS GRAFT N/A 08/24/2014   Procedure: CORONARY ARTERY BYPASS GRAFTING (CABG) x three, using left internal mammary artery and right leg greater saphenous vein harvested endoscopically;  Surgeon: Ivin Poot, MD;  Location: [facility];  Service: Open Heart Surgery;  Laterality: N/A;  . ECTOPIC PREGNANCY SURGERY  1950's  . ERCP with sphincterotomy  07/2002  . ESOPHAGOGASTRODUODENOSCOPY      Gastritis of body.  Otherwise normal  . ESOPHAGOGASTRODUODENOSCOPY  01/08/2012   RMR: Few scattered gastric erosions of uncertain significance-status post biopsy. Minimal chronic inflammation, no H.pylori  .  LAPAROSCOPIC CHOLECYSTECTOMY    . PANCREATIC PSEUDOCYST DRAINAGE  [id number]   drained percutaneously   . right mastectomy    . tacking up of her bladder    . TEE WITHOUT CARDIOVERSION N/A 08/24/2014   Procedure: TRANSESOPHAGEAL ECHOCARDIOGRAM (TEE);  Surgeon: Ivin Poot, MD;  Location: [facility];  Service: Open Heart Surgery;  Laterality: N/A;    Current Medications: Outpatient Medications Prior to Visit  Medication Sig Dispense Refill  . amLODipine (NORVASC) 5 MG tablet Take 1 tablet by mouth once daily 90 tablet 3  . aspirin EC 81 MG EC tablet Take 1 tablet (81 mg total) by mouth daily.    . brimonidine-timolol (COMBIGAN) 0.2-0.5 % ophthalmic solution Place 1 drop into both eyes every 12 (twelve) hours as needed (dry eyes).     . calcium carbonate (TUMS - DOSED IN MG ELEMENTAL CALCIUM) 500 MG chewable tablet Chew 3 tablets by mouth at bedtime.    Marland Kitchen CREON 24000-76000 units CPEP TAKE 2 CAPSULES BY MOUTH WITH MEALS AND 1 CAP WITH SNACKS x 2 300 capsule 11  . cycloSPORINE (RESTASIS) 0.05 % ophthalmic emulsion Place 1 drop into both eyes 3 (three) times daily as needed (for dry eyes).     Noelle Penner ALLERGY RELIEF, CETIRIZINE, 10 MG tablet Take 1 tablet by mouth once daily 90 tablet 0  . ferrous sulfate 325 (65 FE) MG tablet Take 325 mg by mouth daily with breakfast.    . FLUoxetine (PROZAC) 20 MG tablet Take 1 tablet by mouth once daily 90 tablet 3  . gabapentin (NEURONTIN) 100 MG capsule TAKE 1 CAPSULE BY MOUTH THREE TIMES DAILY AS NEEDED FOR  NERVE  PAIN 90 capsule 0  . Glucose Blood (BLOOD GLUCOSE TEST STRIPS) STRP Please dispense based on patient and insurance preference. Use as directed to monitor FSBS 2x daily. Dx: E11.9. 200 strip 3  . glucose blood (ONE TOUCH ULTRA TEST) test strip Use as directed to monitor FSBS 5 x daily. Dx: E11.9. 450 each 3  . insulin NPH-regular Human (NOVOLIN 70/30 RELION) (70-30) 100 UNIT/ML injection INJECT 25 UNITS SUBCUTANEOUSLY IN THE MORNING AND 15 IN THE EVENING  10 mL 2  . Insulin Syringe-Needle U-100 (RELION INSULIN SYR 0.5ML/31G) 31G X 5/16" 0.5 ML MISC Use as directed to inject insulin SQ BID- patient wants 50U syringe 200 each 3  . Lancets (ONETOUCH DELICA PLUS GLOVFI43P) MISC Use as directed. 100 each 1  . Lancets Misc. MISC Pt has Accu Chek Aviva Plus - Needs just lancets  Checks BS 4-5 times per day. Disp 3 boxes(90 day Supply)/4 refills Dx code. E11.9 450 each 3  . LORazepam (ATIVAN) 1 MG tablet TAKE 1/2 TO 1 (ONE-HALF TO ONE) TABLET BY MOUTH TWICE DAILY AS NEEDED 30 tablet 0  . magnesium oxide (MAG-OX) 400 (241.3 Mg) MG tablet Take 1 tablet by mouth once daily 90 tablet 3  . metoprolol tartrate (LOPRESSOR)  25 MG tablet Take 1 tablet by mouth twice daily 180 tablet 0  . Multiple Vitamin (MULTIVITAMIN) capsule Take 1 capsule by mouth every morning.     . pantoprazole (PROTONIX) 40 MG tablet Take 1 tablet by mouth once daily 90 tablet 1  . polyethylene glycol (MIRALAX MIX-IN PAX) 17 g packet Take 17 g by mouth daily.    . potassium chloride (KLOR-CON) 10 MEQ tablet TAKE 1 TABLET BY MOUTH ONCE DAILY AS NEEDED. TAKE WITH FUROSEMIDE. 90 tablet 3  . rosuvastatin (CRESTOR) 5 MG tablet Take 1 tablet by mouth once daily 90 tablet 3  . triamcinolone cream (KENALOG) 0.1 % APPLY CREAM EXTERNALLY TWICE DAILY 60 g 0  . Wheat Dextrin (BENEFIBER DRINK MIX) PACK Take 1 Package by mouth daily as needed (for supplement-digestion).     . furosemide (LASIX) 40 MG tablet Patient takes 30 mg by mouth daily according to weight if she gains more than a pound or two then go back 40 mg    . feeding supplement, ENSURE ENLIVE, (ENSURE ENLIVE) LIQD Take 237 mLs by mouth 2 (two) times daily between meals. (Patient not taking: Reported on 10/21/2019) 237 mL 12  . furosemide (LASIX) 40 MG tablet Take 1 tablet (40 mg total) by mouth daily. (Patient not taking: Reported on 10/21/2019) 30 tablet 2  . polyethylene glycol (MIRALAX / GLYCOLAX) 17 g packet Take 17 g by mouth daily. (Patient  not taking: Reported on 10/21/2019) 14 each 0   No facility-administered medications prior to visit.     Allergies:   Calcium-containing compounds, Morphine and related, Raloxifene, and Vitamin d analogs   Social History   Socioeconomic History  . Marital status: Widowed    Spouse name: Not on file  . Number of children: 5  . Years of education: Not on file  . Highest education level: Not on file  Occupational History  . Occupation: homemaker  Tobacco Use  . Smoking status: Never Smoker  . Smokeless tobacco: Never Used  Vaping Use  . Vaping Use: Never used  Substance and Sexual Activity  . Alcohol use: No    Alcohol/week: 0.0 standard drinks  . Drug use: No  . Sexual activity: Not Currently    Birth control/protection: Post-menopausal  Other Topics Concern  . Not on file  Social History Narrative   Lives in [place].   Social Determinants of Health   Financial Resource Strain:   . Difficulty of Paying Living Expenses:   Food Insecurity:   . Worried About Charity fundraiser in the Last Year:   . Arboriculturist in the Last Year:   Transportation Needs:   . Film/video editor (Medical):   Marland Kitchen Lack of Transportation (Non-Medical):   Physical Activity:   . Days of Exercise per Week:   . Minutes of Exercise per Session:   Stress:   . Feeling of Stress :   Social Connections:   . Frequency of Communication with Friends and Family:   . Frequency of Social Gatherings with Friends and Family:   . Attends Religious Services:   . Active Member of Clubs or Organizations:   . Attends Archivist Meetings:   Marland Kitchen Marital Status:      Family History:  The patient's family history includes Colon cancer (age of onset: 54) in her brother; Colon cancer (age of onset: 46) in her father; Heart attack in her brother; Kidney disease in her daughter; Leukemia in her son; Ovarian cancer in  her mother; Pancreatitis in her brother.   Review of Systems:   Please see the  history of present illness.     General:  No chills, fever, night sweats or weight changes.  Cardiovascular:  No chest pain, dyspnea on exertion, orthopnea, palpitations, paroxysmal nocturnal dyspnea. Positive for edema.  Dermatological: No rash, lesions/masses Respiratory: No cough, dyspnea Urologic: No hematuria, dysuria Abdominal:   No nausea, vomiting, diarrhea, bright red blood per rectum, melena, or hematemesis Neurologic:  No visual changes, wkns, changes in mental status. All other systems reviewed and are otherwise negative except as noted above.   Physical Exam:    VS:  BP 132/60   Pulse (!) 59   Ht 5\' 3"  (1.6 m)   Wt 141 lb (64 kg)   SpO2 94%   BMI 24.98 kg/m    General: Well developed, elderly female appearing in no acute distress. Head: Normocephalic, atraumatic. Neck: No carotid bruits. JVD not elevated.  Lungs: Respirations regular and unlabored, without wheezes or rales.  Heart: Regular rate and rhythm. No S3 or S4.  No murmur, no rubs, or gallops appreciated. Abdomen: Appears non-distended. No obvious abdominal masses. Msk:  Strength and tone appear normal for age. No obvious joint deformities or effusions. Extremities: No clubbing or cyanosis. Trace lower extremity edema bilaterally.  Distal pedal pulses are 2+ bilaterally. Neuro: Alert and oriented X 3. Moves all extremities spontaneously. No focal deficits noted. Psych:  Responds to questions appropriately with a normal affect. Skin: No rashes or lesions noted  Wt Readings from Last 3 Encounters:  10/21/19 141 lb (64 kg)  10/04/19 141 lb (64 kg)  09/26/19 150 lb 5.7 oz (68.2 kg)    Studies/Labs Reviewed:   EKG:  EKG is not ordered today.  Recent Labs: 09/23/2019: B Natriuretic Peptide 719.0; Magnesium 2.0 10/04/2019: ALT 21; Hemoglobin 11.9; Platelets 182 10/10/2019: BUN 30; Creat 1.28; Potassium 4.5; Sodium 135   Lipid Panel    Component Value Date/Time   CHOL 106 08/01/2016 0812   TRIG 85  08/01/2016 0812   TRIG 90 10/15/2011 1538   HDL 67 08/01/2016 0812   CHOLHDL 1.6 08/01/2016 0812   VLDL 17 08/01/2016 0812   LDLCALC 22 08/01/2016 0812    Additional studies/ records that were reviewed today include:   Echocardiogram: 09/2019 IMPRESSIONS    1. Distal septal hypokinesis. Left ventricular ejection fraction, by  estimation, is 55%. The left ventricle has normal function. The left  ventricle demonstrates regional wall motion abnormalities (see scoring  diagram/findings for description). Left  ventricular diastolic parameters were normal.  2. Right ventricular systolic function is normal. The right ventricular  size is normal. There is normal pulmonary artery systolic pressure.  3. Left atrial size was mildly dilated.  4. The mitral valve is normal in structure. Mild mitral valve  regurgitation.  5. The aortic valve is tricuspid. Aortic valve regurgitation is trivial.  Mild to moderate aortic valve sclerosis/calcification is present, without  any evidence of aortic stenosis.   Assessment:    1. Coronary artery disease involving native coronary artery of native heart without angina pectoris   2. Chronic diastolic heart failure ([place])   3. Medication management   4. Essential hypertension   5. Mixed hyperlipidemia   6. Mitral valve insufficiency, unspecified etiology      Plan:   In order of problems listed above:  1. CAD - She is s/p CABG in 08/2014 with LIMA-LAD, SVG-OM and SVG-PDA. Reports her breathing has significantly improved since  her recent admission and she denies any chest pain.  - Continue current medication regimen with ASA 81mg  daily, Lopressor 25mg  BID and Crestor 5mg  daily.    2. Chronic Diastolic CHF - Her weight has varied between 136 - 142 lbs on her home scales. Lasix 40mg  daily previously led to hyponatremia but she was consuming over 100 ounces of fluid daily. Recommended she continue Lasix 30mg  daily for now and take an extra 10mg   if needed for weight gain or edema. Will recheck BMET in 2 weeks to reassess kidney function and electrolytes. Recommended she continue to limit fluid intake to less than 2 L daily.   3. HTN - BP is well-controlled at 132/60 during today's visit. Continue current medication regimen.   4. HLD - She has been intolerant to high-intensity statin therapy. Remains on Crestor 5mg  daily.   5. Mitral Valve Regurgitation - Previously in a moderate range, read as mild by repeat imaging last month. Will continue to follow.    Medication Adjustments/Labs and Tests Ordered: Current medicines are reviewed at length with the patient today.  Concerns regarding medicines are outlined above.  Medication changes, Labs and Tests ordered today are listed in the Patient Instructions below. Patient Instructions  Medication Instructions:  Your physician recommends that you continue on your current medications as directed. Please refer to the Current Medication list given to you today.  Continue Lasix 30 mg Daily   *If you need a refill on your cardiac medications before your next appointment, please call your pharmacy*   Lab Work: Your physician recommends that you return for lab work in: 2 Weeks ( BMET)   If you have labs (blood work) drawn today and your tests are completely normal, you will receive your results only by: Marland Kitchen MyChart Message (if you have MyChart) OR . A paper copy in the mail If you have any lab test that is abnormal or we need to change your treatment, we will call you to review the results.   Testing/Procedures: NONE   Follow-Up: At [facility], you and your health needs are our priority.  As part of our continuing mission to provide you with exceptional heart care, we have created designated Provider Care Teams.  These Care Teams include your primary Cardiologist (physician) and Advanced Practice Providers (APPs -  Physician Assistants and Nurse Practitioners) who all work together  to provide you with the care you need, when you need it.  We recommend signing up for the patient portal called "MyChart".  Sign up information is provided on this After Visit Summary.  MyChart is used to connect with patients for Virtual Visits (Telemedicine).  Patients are able to view lab/test results, encounter notes, upcoming appointments, etc.  Non-urgent messages can be sent to your provider as well.   To learn more about what you can do with MyChart, go to [email or web address].    Your next appointment:   2-3 month(s)  The format for your next appointment:   In Person  Provider:   Carlyle Dolly, MD or Bernerd Pho, PA-C   Other Instructions Thank you for choosing [facility]!    Signed, Erma Heritage, PA-C  10/22/2019 9:14 AM    [address] [address] [place], [place] [address] Phone: [phone] Fax: [phone]

## 2019-10-21 NOTE — Patient Instructions (Addendum)
Medication Instructions:  Your physician recommends that you continue on your current medications as directed. Please refer to the Current Medication list given to you today.  Continue Lasix 30 mg Daily   *If you need a refill on your cardiac medications before your next appointment, please call your pharmacy*   Lab Work: Your physician recommends that you return for lab work in: 2 Weeks ( BMET)   If you have labs (blood work) drawn today and your tests are completely normal, you will receive your results only by: Marland Kitchen MyChart Message (if you have MyChart) OR . A paper copy in the mail If you have any lab test that is abnormal or we need to change your treatment, we will call you to review the results.   Testing/Procedures: NONE   Follow-Up: At [facility], you and your health needs are our priority.  As part of our continuing mission to provide you with exceptional heart care, we have created designated Provider Care Teams.  These Care Teams include your primary Cardiologist (physician) and Advanced Practice Providers (APPs -  Physician Assistants and Nurse Practitioners) who all work together to provide you with the care you need, when you need it.  We recommend signing up for the patient portal called "MyChart".  Sign up information is provided on this After Visit Summary.  MyChart is used to connect with patients for Virtual Visits (Telemedicine).  Patients are able to view lab/test results, encounter notes, upcoming appointments, etc.  Non-urgent messages can be sent to your provider as well.   To learn more about what you can do with MyChart, go to [email or web address].    Your next appointment:   2-3 month(s)  The format for your next appointment:   In Person  Provider:   Carlyle Dolly, MD or Bernerd Pho, PA-C   Other Instructions Thank you for choosing [facility]!

## 2019-10-22 ENCOUNTER — Encounter: Payer: Self-pay | Admitting: Student

## 2019-11-02 DIAGNOSIS — R69 Illness, unspecified: Secondary | ICD-10-CM | POA: Diagnosis not present

## 2019-11-11 DIAGNOSIS — R69 Illness, unspecified: Secondary | ICD-10-CM | POA: Diagnosis not present

## 2019-11-13 ENCOUNTER — Other Ambulatory Visit: Payer: Self-pay | Admitting: Family Medicine

## 2019-11-14 NOTE — Telephone Encounter (Signed)
Ok to refill Ativan??  Last office visit 10/04/2019.  Last refill 10/17/2019.

## 2019-11-16 ENCOUNTER — Other Ambulatory Visit: Payer: Self-pay

## 2019-11-16 MED ORDER — GLUCOSE BLOOD VI STRP: ORAL_STRIP | 3 refills | Status: DC

## 2019-12-09 ENCOUNTER — Other Ambulatory Visit: Payer: Self-pay | Admitting: *Deleted

## 2019-12-09 MED ORDER — GLUCOSE BLOOD VI STRP: ORAL_STRIP | 3 refills | Status: DC

## 2019-12-13 ENCOUNTER — Other Ambulatory Visit: Payer: Self-pay | Admitting: Family Medicine

## 2019-12-13 ENCOUNTER — Other Ambulatory Visit: Payer: Self-pay | Admitting: Cardiology

## 2019-12-27 ENCOUNTER — Other Ambulatory Visit (HOSPITAL_COMMUNITY)
Admission: RE | Admit: 2019-12-27 | Discharge: 2019-12-27 | Disposition: A | Discharge status: Home / Self Care | Payer: Medicare HMO | Source: Ambulatory Visit | Attending: Student | Admitting: Student

## 2019-12-27 DIAGNOSIS — Z79899 Other long term (current) drug therapy: Secondary | ICD-10-CM | POA: Insufficient documentation

## 2019-12-27 LAB — BASIC METABOLIC PANEL
Anion gap: 9 (ref 5–15)
BUN: 26 mg/dL — ABNORMAL HIGH (ref 8–23)
CO2: 28 mmol/L (ref 22–32)
Calcium: 9.5 mg/dL (ref 8.9–10.3)
Chloride: 95 mmol/L — ABNORMAL LOW (ref 98–111)
Creatinine, Ser: 1.11 mg/dL — ABNORMAL HIGH (ref 0.44–1.00)
GFR, Estimated: 43 mL/min — ABNORMAL LOW (ref >60)
Glucose, Bld: 168 mg/dL — ABNORMAL HIGH (ref 70–99)
Potassium: 4.1 mmol/L (ref 3.5–5.1)
Sodium: 132 mmol/L — ABNORMAL LOW (ref 135–145)

## 2020-01-04 ENCOUNTER — Other Ambulatory Visit: Payer: Self-pay

## 2020-01-04 ENCOUNTER — Encounter: Payer: Self-pay | Admitting: Student

## 2020-01-04 ENCOUNTER — Ambulatory Visit: Payer: Medicare HMO | Admitting: Student

## 2020-01-04 VITALS — BP 138/60 | HR 56 | Ht 63.0 in | Wt 143.2 lb

## 2020-01-04 DIAGNOSIS — I340 Nonrheumatic mitral (valve) insufficiency: Secondary | ICD-10-CM | POA: Diagnosis not present

## 2020-01-04 DIAGNOSIS — E782 Mixed hyperlipidemia: Secondary | ICD-10-CM | POA: Diagnosis not present

## 2020-01-04 DIAGNOSIS — I2510 Atherosclerotic heart disease of native coronary artery without angina pectoris: Secondary | ICD-10-CM

## 2020-01-04 DIAGNOSIS — I10 Essential (primary) hypertension: Secondary | ICD-10-CM | POA: Diagnosis not present

## 2020-01-04 DIAGNOSIS — I5032 Chronic diastolic (congestive) heart failure: Secondary | ICD-10-CM

## 2020-01-04 MED ORDER — METOPROLOL TARTRATE 25 MG PO TABS: 25.0000 mg | ORAL_TABLET | Freq: Two times a day (BID) | ORAL | 3 refills | Status: DC

## 2020-01-04 MED ORDER — FUROSEMIDE 20 MG PO TABS: 20.0000 mg | ORAL_TABLET | Freq: Every day | ORAL | 3 refills | Status: DC

## 2020-01-04 NOTE — Progress Notes (Signed)
Cardiology Office Note    Date:  01/04/2020   ID:  Misty Edwards, DOB 11/07/1926, MRN [id number]  PCP:  Susy Frizzle, MD  Cardiologist: Carlyle Dolly, MD    Chief Complaint  Patient presents with  . Follow-up    2 month visit    History of Present Illness:    Misty Edwards is a 86 y.o. female with past medical history of CAD (s/p CABG in 08/2014 with LIMA-LAD, SVG-OM and SVG-PDA), HTN, HLD, moderate MR and Stage 3 CKD who presents to the office today for 77-month follow-up.  She was last examined myself in 10/2019 for hospital follow-up from her recent CHF exacerbation. She reported overall doing well at the time of her visit and was taking extra Lasix with improvement in her symptoms. She had reduced her fluid intake at home and noticed improvement in her symptoms as well.  She was continued on Lasix 30 mg daily with instructions to take an extra 10 mg if needed for weight gain or edema. She had previously experienced issues with hyponatremia on higher dosing and repeat labs were recommended. Repeat BMET on 12/27/2019 showed Na+ was stable at 132 with creatinine at 1.11. She was continued on her current medications at that time.   In talking with the patient and her grandson today, she reports doing well since her last visit. She has reduced her Lasix to 20mg  daily and says that weight has been stable on her home scales. No recent edema, orthopnea or PND. She does have baseline dyspnea on exertion with some activities but this has been unchanged. No recent chest pain or palpitations.    Past Medical History:  Diagnosis Date  . Acute biliary pancreatitis 07/2002   thia was in 07/2004:she still has pseudocyst in tail of pancreas measuring 54 x 33 mm   . Adrenal adenoma    bilateral  . Anxiety   . CAD (coronary artery disease)    a. s/p CABG in 08/2014 with LIMA-LAD, SVG-OM and SVG-PDA  . Chronic pancreatitis ([place])   . Detached retina   . Diabetes mellitus ([place])   .  Diverticulosis   . History of carcinoma in situ of breast 1988  . Hyperlipidemia   . Hypertension   . IBS (irritable bowel syndrome)   . Legally blind   . Osteoarthritis   . Osteopenia   . Upper GI bleed September2004   secondary to gastritis    Past Surgical History:  Procedure Laterality Date  . CARDIAC CATHETERIZATION N/A 08/24/2014   Procedure: Left Heart Cath and Coronary Angiography;  Surgeon: Leonie Man, MD;  Location: [facility];  Service: Cardiovascular;  Laterality: N/A;  . COLONOSCOPY  08/15/05   few tiny diverticula at sigmoid colon/external hemorrhoids but no polyps  . COLONOSCOPY  01/08/2012   WUJ:[id number] diverticulosis. Next colonoscopy in 12/2016  . CORONARY ARTERY BYPASS GRAFT N/A 08/24/2014   Procedure: CORONARY ARTERY BYPASS GRAFTING (CABG) x three, using left internal mammary artery and right leg greater saphenous vein harvested endoscopically;  Surgeon: Ivin Poot, MD;  Location: [facility];  Service: Open Heart Surgery;  Laterality: N/A;  . ECTOPIC PREGNANCY SURGERY  1950's  . ERCP with sphincterotomy  07/2002  . ESOPHAGOGASTRODUODENOSCOPY      Gastritis of body.  Otherwise normal  . ESOPHAGOGASTRODUODENOSCOPY  01/08/2012   RMR: Few scattered gastric erosions of uncertain significance-status post biopsy. Minimal chronic inflammation, no H.pylori  . LAPAROSCOPIC CHOLECYSTECTOMY    . PANCREATIC PSEUDOCYST  DRAINAGE  [id number]   drained percutaneously   . right mastectomy    . tacking up of her bladder    . TEE WITHOUT CARDIOVERSION N/A 08/24/2014   Procedure: TRANSESOPHAGEAL ECHOCARDIOGRAM (TEE);  Surgeon: Ivin Poot, MD;  Location: [facility];  Service: Open Heart Surgery;  Laterality: N/A;    Current Medications: Outpatient Medications Prior to Visit  Medication Sig Dispense Refill  . amLODipine (NORVASC) 5 MG tablet Take 1 tablet by mouth once daily 90 tablet 3  . aspirin EC 81 MG EC tablet Take 1 tablet (81 mg total) by mouth daily.    .  brimonidine-timolol (COMBIGAN) 0.2-0.5 % ophthalmic solution Place 1 drop into both eyes every 12 (twelve) hours as needed (dry eyes).     . calcium carbonate (TUMS - DOSED IN MG ELEMENTAL CALCIUM) 500 MG chewable tablet Chew 3 tablets by mouth at bedtime.    Marland Kitchen CREON 24000-76000 units CPEP TAKE 2 CAPSULES BY MOUTH WITH MEALS AND 1 CAP WITH SNACKS x 2 300 capsule 11  . cycloSPORINE (RESTASIS) 0.05 % ophthalmic emulsion Place 1 drop into both eyes 3 (three) times daily as needed (for dry eyes).     Noelle Penner ALLERGY RELIEF, CETIRIZINE, 10 MG tablet Take 1 tablet by mouth once daily 90 tablet 0  . ferrous sulfate 325 (65 FE) MG tablet Take 325 mg by mouth daily with breakfast.    . FLUoxetine (PROZAC) 20 MG tablet Take 1 tablet by mouth once daily 90 tablet 3  . gabapentin (NEURONTIN) 100 MG capsule TAKE 1 CAPSULE BY MOUTH THREE TIMES DAILY AS NEEDED FOR  NERVE  PAIN 90 capsule 0  . glucose blood (ONE TOUCH ULTRA TEST) test strip Use as directed to monitor FSBS 3 x daily. Dx: E11.9. 100 each 3  . insulin NPH-regular Human (NOVOLIN 70/30 RELION) (70-30) 100 UNIT/ML injection INJECT 25 UNITS SUBCUTANEOUSLY IN THE MORNING AND 15 IN THE EVENING 10 mL 2  . Insulin Syringe-Needle U-100 (RELION INSULIN SYR 0.5ML/31G) 31G X 5/16" 0.5 ML MISC Use as directed to inject insulin SQ BID- patient wants 50U syringe 200 each 3  . Lancets (ONETOUCH DELICA PLUS EXNTZG01V) MISC Use as directed. 100 each 1  . Lancets Misc. MISC Pt has Accu Chek Aviva Plus - Needs just lancets  Checks BS 4-5 times per day. Disp 3 boxes(90 day Supply)/4 refills Dx code. E11.9 450 each 3  . LORazepam (ATIVAN) 1 MG tablet TAKE 1/2 TO 1 (ONE-HALF TO ONE) TABLET BY MOUTH TWICE DAILY AS NEEDED 30 tablet 0  . magnesium oxide (MAG-OX) 400 (241.3 Mg) MG tablet Take 1 tablet by mouth once daily 90 tablet 3  . Multiple Vitamin (MULTIVITAMIN) capsule Take 1 capsule by mouth every morning.     . pantoprazole (PROTONIX) 40 MG tablet Take 1 tablet by mouth  once daily 90 tablet 0  . polyethylene glycol (MIRALAX MIX-IN PAX) 17 g packet Take 17 g by mouth daily.    . potassium chloride (KLOR-CON) 10 MEQ tablet TAKE 1 TABLET BY MOUTH ONCE DAILY AS NEEDED. TAKE WITH FUROSEMIDE. 90 tablet 3  . rosuvastatin (CRESTOR) 5 MG tablet Take 1 tablet by mouth once daily 90 tablet 3  . triamcinolone cream (KENALOG) 0.1 % APPLY CREAM EXTERNALLY TWICE DAILY 60 g 0  . Wheat Dextrin (BENEFIBER DRINK MIX) PACK Take 1 Package by mouth daily as needed (for supplement-digestion).     . furosemide (LASIX) 20 MG tablet Take 1 tablet by mouth once daily 90  tablet 0  . metoprolol tartrate (LOPRESSOR) 25 MG tablet Take 1 tablet by mouth twice daily 180 tablet 0   No facility-administered medications prior to visit.     Allergies:   Calcium-containing compounds, Morphine and related, Raloxifene, and Vitamin d analogs   Social History   Socioeconomic History  . Marital status: Widowed    Spouse name: Not on file  . Number of children: 5  . Years of education: Not on file  . Highest education level: Not on file  Occupational History  . Occupation: homemaker  Tobacco Use  . Smoking status: Never Smoker  . Smokeless tobacco: Never Used  Vaping Use  . Vaping Use: Never used  Substance and Sexual Activity  . Alcohol use: No    Alcohol/week: 0.0 standard drinks  . Drug use: No  . Sexual activity: Not Currently    Birth control/protection: Post-menopausal  Other Topics Concern  . Not on file  Social History Narrative   Lives in [place].   Social Determinants of Health   Financial Resource Strain:   . Difficulty of Paying Living Expenses: Not on file  Food Insecurity:   . Worried About Charity fundraiser in the Last Year: Not on file  . Ran Out of Food in the Last Year: Not on file  Transportation Needs:   . Lack of Transportation (Medical): Not on file  . Lack of Transportation (Non-Medical): Not on file  Physical Activity:   . Days of Exercise per  Week: Not on file  . Minutes of Exercise per Session: Not on file  Stress:   . Feeling of Stress : Not on file  Social Connections:   . Frequency of Communication with Friends and Family: Not on file  . Frequency of Social Gatherings with Friends and Family: Not on file  . Attends Religious Services: Not on file  . Active Member of Clubs or Organizations: Not on file  . Attends Archivist Meetings: Not on file  . Marital Status: Not on file     Family History:  The patient's family history includes Colon cancer (age of onset: 44) in her brother; Colon cancer (age of onset: 8) in her father; Heart attack in her brother; Kidney disease in her daughter; Leukemia in her son; Ovarian cancer in her mother; Pancreatitis in her brother.   Review of Systems:   Please see the history of present illness.     General:  No chills, fever, night sweats or weight changes.  Cardiovascular:  No chest pain, dyspnea on exertion, orthopnea, palpitations, paroxysmal nocturnal dyspnea. Positive for edema (resolved).  Dermatological: No rash, lesions/masses Respiratory: No cough, dyspnea Urologic: No hematuria, dysuria Abdominal:   No nausea, vomiting, diarrhea, bright red blood per rectum, melena, or hematemesis Neurologic:  No visual changes, wkns, changes in mental status. All other systems reviewed and are otherwise negative except as noted above.   Physical Exam:    VS:  BP 138/60   Pulse (!) 56   Ht 5\' 3"  (1.6 m)   Wt 143 lb 3.2 oz (65 kg)   SpO2 96%   BMI 25.37 kg/m    General: Well developed, elderly female appearing in no acute distress. Head: Normocephalic, atraumatic. Neck: No carotid bruits. JVD not elevated.  Lungs: Respirations regular and unlabored, without wheezes or rales.  Heart: Regular rate and rhythm. No S3 or S4.  2/6 holosystolic murmur along Apex.  Abdomen: Appears non-distended. No obvious abdominal masses. Msk:  Strength and  tone appear normal for age. No  obvious joint deformities or effusions. Extremities: No clubbing or cyanosis. Trace ankle edema bilaterally.  Distal pedal pulses are 2+ bilaterally. Neuro: Alert and oriented X 3. Moves all extremities spontaneously. No focal deficits noted. Psych:  Responds to questions appropriately with a normal affect. Skin: No rashes or lesions noted  Wt Readings from Last 3 Encounters:  01/04/20 143 lb 3.2 oz (65 kg)  10/21/19 141 lb (64 kg)  10/04/19 141 lb (64 kg)      Studies/Labs Reviewed:   EKG:  EKG is not ordered today.    Recent Labs: 09/23/2019: B Natriuretic Peptide 719.0; Magnesium 2.0 10/04/2019: ALT 21; Hemoglobin 11.9; Platelets 182 12/27/2019: BUN 26; Creatinine, Ser 1.11; Potassium 4.1; Sodium 132   Lipid Panel    Component Value Date/Time   CHOL 106 08/01/2016 0812   TRIG 85 08/01/2016 0812   TRIG 90 10/15/2011 1538   HDL 67 08/01/2016 0812   CHOLHDL 1.6 08/01/2016 0812   VLDL 17 08/01/2016 0812   LDLCALC 22 08/01/2016 0812    Additional studies/ records that were reviewed today include:   Echocardiogram: 09/2019 IMPRESSIONS    1. Distal septal hypokinesis. Left ventricular ejection fraction, by  estimation, is 55%. The left ventricle has normal function. The left  ventricle demonstrates regional wall motion abnormalities (see scoring  diagram/findings for description). Left  ventricular diastolic parameters were normal.  2. Right ventricular systolic function is normal. The right ventricular  size is normal. There is normal pulmonary artery systolic pressure.  3. Left atrial size was mildly dilated.  4. The mitral valve is normal in structure. Mild mitral valve  regurgitation.  5. The aortic valve is tricuspid. Aortic valve regurgitation is trivial.  Mild to moderate aortic valve sclerosis/calcification is present, without  any evidence of aortic stenosis.   Assessment:    1. Coronary artery disease involving native coronary artery of native heart  without angina pectoris   2. Chronic diastolic heart failure ([place])   3. Essential hypertension   4. Mixed hyperlipidemia   5. Mitral valve insufficiency, unspecified etiology      Plan:   In order of problems listed above:  1. CAD - She is s/p CABG in 08/2014 with LIMA-LAD, SVG-OM and SVG-PDA.Marland Kitchen She has baseline dyspnea on exertion if she overexerts herself but this has been stable and she denies any acute changes in her symptoms. No recent chest pain.  - Continue current medication regimen with ASA 81mg  daily, Lopressor 25mg  BID and Crestor 5mg  daily.   2. Chronic Diastolic CHF - Her edema has improved since her last visit and weight remains stable on her home scales. Continue Lasix 20mg  daily as renal function was stable by most recent labs. She has been monitoring her sodium and fluid intake.   3. HTN - BP is well-controlled at 138/60 during today's visit. Continue current regimen with Amlodipine 5mg  daily and Lopressor 25mg  BID. Would not further titrate Lopressor given her HR in the 50's.   4. HLD -  Followed by PCP. Remains on Crestor 5mg  daily.   5. Mitral Regurgitation - Was read as mild by most recent echocardiogram in 09/2019. Continue to follow.    Medication Adjustments/Labs and Tests Ordered: Current medicines are reviewed at length with the patient today.  Concerns regarding medicines are outlined above.  Medication changes, Labs and Tests ordered today are listed in the Patient Instructions below. Patient Instructions  Medication Instructions:  Your physician recommends that you continue  on your current medications as directed. Please refer to the Current Medication list given to you today.  *If you need a refill on your cardiac medications before your next appointment, please call your pharmacy*   Lab Work: None today If you have labs (blood work) drawn today and your tests are completely normal, you will receive your results only by: Marland Kitchen MyChart Message (if you  have MyChart) OR . A paper copy in the mail If you have any lab test that is abnormal or we need to change your treatment, we will call you to review the results.   Testing/Procedures: None today   Follow-Up: At [facility], you and your health needs are our priority.  As part of our continuing mission to provide you with exceptional heart care, we have created designated Provider Care Teams.  These Care Teams include your primary Cardiologist (physician) and Advanced Practice Providers (APPs -  Physician Assistants and Nurse Practitioners) who all work together to provide you with the care you need, when you need it.  We recommend signing up for the patient portal called "MyChart".  Sign up information is provided on this After Visit Summary.  MyChart is used to connect with patients for Virtual Visits (Telemedicine).  Patients are able to view lab/test results, encounter notes, upcoming appointments, etc.  Non-urgent messages can be sent to your provider as well.   To learn more about what you can do with MyChart, go to [email or web address].    Your next appointment:   6 month(s)  The format for your next appointment:   In Person  Provider:   Carlyle Dolly, MD   Other Instructions None      Thank you for choosing [facility] !            Signed, Erma Heritage, PA-C  01/04/2020 7:06 PM    [place] [address] [place], [place] [address] Phone: [phone] Fax: [phone]

## 2020-01-04 NOTE — Patient Instructions (Signed)
Medication Instructions:  Your physician recommends that you continue on your current medications as directed. Please refer to the Current Medication list given to you today.  *If you need a refill on your cardiac medications before your next appointment, please call your pharmacy*   Lab Work: None today If you have labs (blood work) drawn today and your tests are completely normal, you will receive your results only by: . MyChart Message (if you have MyChart) OR . A paper copy in the mail If you have any lab test that is abnormal or we need to change your treatment, we will call you to review the results.   Testing/Procedures: None today   Follow-Up: At [facility], you and your health needs are our priority.  As part of our continuing mission to provide you with exceptional heart care, we have created designated Provider Care Teams.  These Care Teams include your primary Cardiologist (physician) and Advanced Practice Providers (APPs -  Physician Assistants and Nurse Practitioners) who all work together to provide you with the care you need, when you need it.  We recommend signing up for the patient portal called "MyChart".  Sign up information is provided on this After Visit Summary.  MyChart is used to connect with patients for Virtual Visits (Telemedicine).  Patients are able to view lab/test results, encounter notes, upcoming appointments, etc.  Non-urgent messages can be sent to your provider as well.   To learn more about what you can do with MyChart, go to [email or web address].    Your next appointment:   6 month(s)  The format for your next appointment:   In Person  Provider:   Jonathan Branch, MD   Other Instructions None       Thank you for choosing [facility] !         

## 2020-01-05 ENCOUNTER — Other Ambulatory Visit (HOSPITAL_COMMUNITY): Payer: Self-pay | Admitting: Family Medicine

## 2020-01-05 ENCOUNTER — Ambulatory Visit: Payer: Medicare HMO | Admitting: Family Medicine

## 2020-01-05 DIAGNOSIS — Z1231 Encounter for screening mammogram for malignant neoplasm of breast: Secondary | ICD-10-CM

## 2020-01-06 ENCOUNTER — Ambulatory Visit (INDEPENDENT_AMBULATORY_CARE_PROVIDER_SITE_OTHER): Payer: Medicare HMO | Admitting: Family Medicine

## 2020-01-06 ENCOUNTER — Other Ambulatory Visit: Payer: Self-pay

## 2020-01-06 VITALS — BP 130/70 | HR 61 | Temp 97.4°F | Ht 63.0 in | Wt 146.0 lb

## 2020-01-06 DIAGNOSIS — Z23 Encounter for immunization: Secondary | ICD-10-CM | POA: Diagnosis not present

## 2020-01-06 DIAGNOSIS — N1832 Chronic kidney disease, stage 3b: Secondary | ICD-10-CM

## 2020-01-06 DIAGNOSIS — E1169 Type 2 diabetes mellitus with other specified complication: Secondary | ICD-10-CM | POA: Diagnosis not present

## 2020-01-06 DIAGNOSIS — I10 Essential (primary) hypertension: Secondary | ICD-10-CM | POA: Diagnosis not present

## 2020-01-06 DIAGNOSIS — Z794 Long term (current) use of insulin: Secondary | ICD-10-CM

## 2020-01-06 DIAGNOSIS — I5032 Chronic diastolic (congestive) heart failure: Secondary | ICD-10-CM | POA: Diagnosis not present

## 2020-01-06 MED ORDER — CEFDINIR 300 MG PO CAPS: 300.0000 mg | ORAL_CAPSULE | Freq: Two times a day (BID) | ORAL | 0 refills | Status: DC

## 2020-01-06 MED ORDER — FLUTICASONE PROPIONATE 50 MCG/ACT NA SUSP: 2.0000 | Freq: Every day | NASAL | 6 refills | Status: DC

## 2020-01-06 NOTE — Progress Notes (Signed)
Subjective:    Patient ID: Misty Edwards, female    DOB: [birth year], 86 y.o.   MRN: [id number]  HPI   Patient was recently admitted with hypoxic respiratory failure due to pulmonary edema.  I have copied the DC summary below for my reference: Admit date: 09/23/2019 Discharge date: 09/26/2019  Recommendations for Outpatient Follow-up:  Repeat basic metabolic panel to evaluate lites and renal function Reassess patient CBGs with further adjustment to hypoglycemic regimen as needed. Reassess blood pressure and further adjust antihypertensive regimen as required.  Discharge Diagnoses:  Active Problems:   Essential hypertension   Chronic pancreatitis (HCC)   Dyslipidemia   Acute on chronic diastolic CHF (congestive heart failure) (HCC)   Acute respiratory failure with hypoxia (HCC)   Acute respiratory failure (HCC)   Acute pulmonary edema ([name])   Discharge Condition: Stable and improved.  Discharged home with instruction to follow-up with PCP and cardiology service.       Filed Weights   09/23/19 2326 09/24/19 1847 09/26/19 0555  Weight: 65.1 kg 64.1 kg 68.2 kg    History of present illness:  As per H&P written by [name] on 09/23/2019 86 year old female with past medical history significant of type 2 diabetes, coronary disease status post CABG in 2016, hypertension, hyperlipidemia, chronic diastolic heart failure and chronic pancreatitis; who presented to the hospital secondary to shortness of breath and swelling. Patient found with acute respiratory failure with hypoxia in the setting of flash pulmonary edema and vascular congestion due to acute on chronic diastolic heart failure. Patient ended requiring BiPAP at time of admission secondary to ongoing hypoxia (desaturation 82% on presentation).  Hospital Course:  1-Acute respiratory failure with hypoxemia: In the setting of acute on chronic diastolic CHF. -Ended requiring BiPAP at time of admission; now without  oxygen supplementation and good O2 sat. -no frank crackles and reporting no orthopnea. -Continue daily weights and low sodium diet -discharge on lasix 40 mg daily -Repeat echo: see results below.  See ejection fraction, no significant valvular disorder.  Unable to evaluate diastolic dysfunction at this time  2-Essential hypertension -Stable currently; initially presented with hypertensive urgency. -continue current management with adjustments reassess VS at follow visit -heart healthy diet encouraged.   3-Chronic pancreatitis (HCC) -No abdominal pain -Continue to follow heart healthy diet -Continue Creon.  4-Dyslipidemia -Continue statins.  5-initially with urinary retention -Bladder scan with 400 mL inside her bladder; in and out cath x1 provided -patient on adjusted dose of lasix now and voiding without problem  6-type 2 diabetes mellitus with nephropathy -Continue to follow CBGs and further adjust hypoglycemic regimen as needed. -Modified carbohydrate diet encouraged.  7-Acute on chronic kidney disease stage IIIb -In the setting of acute on chronic heart failure and decreased perfusion. -Improved and appropriate trending down -Given patient's chronic kidney disease Metformin has been discontinued discharge. -Advised of antibiotic for hydration -Repeat basic metabolic panel to follow renal function trend at follow-up visit.  8-hyponatremia -In the setting of delutionaleffect from fluid overload -Repeat basic metabolic panel follow-up visit to reassess electrolytes trend and stability.  Procedures:  2-D echo: 1. Distal septal hypokinesis. Left ventricular ejection fraction, by  estimation, is 55%. The left ventricle has normal function. The left  ventricle demonstrates regional wall motion abnormalities (see scoring  diagram/findings for description). Left  ventricular diastolic parameters were normal.  2. Right ventricular systolic function is normal. The  right ventricular  size is normal. There is normal pulmonary artery systolic pressure.  3. Left  atrial size was mildly dilated.  4. The mitral valve is normal in structure. Mild mitral valve  regurgitation.  5. The aortic valve is tricuspid. Aortic valve regurgitation is trivial.  Mild to moderate aortic valve sclerosis/calcification is present, without  any evidence of aortic stenosis.    10/04/19  Patient previously had been taking Lasix 20 mg a day.  She had been out of the medication for 2 days prior to her hospital admission.  Therefore she has had no diuretic for 48 hours.  They were also having several family gatherings due to the loss of a loved one.  During that time she was eating an excessive amount of sodium including [place] fries and pizza.  The day that she went to the hospital she was complaining of shortness of breath and leg swelling.  She became hypoxic and confused prompting them to call 911.  Since discharge from the hospital, the patient states she has been doing fine.  She is taking the higher dose Lasix but they are concerned that she may be getting dehydrated.  She reports feeling lightheaded.  There is no pitting edema in her legs.  She denies any cough.  She denies any pleurisy.  She denies any orthopnea.  She denies any paroxysmal nocturnal dyspnea.  She denies any chest pressure.  She stopped Metformin due to her renal insufficiency.  Her hemoglobin A1c prior to going to the hospital was 6.4.  She has been taking insulin, 25 units in the morning and 15 units in the evening.  Her blood sugars at all been less than 200 prior to her hospitalization.  Due to her renal insufficiency they held Metformin and now her sugars are elevated.  Her sugar this morning was over 200.  She denies any hypoglycemic episodes.  At that time, my plan was: I believe the patient's acute pulmonary edema secondary to diastolic dysfunction was due to missing the dose of the diuretic and increased  salt consumption.  Therefore I believe she most likely just needs to be on 20 mg of Lasix moving forward.  Today there is no evidence of fluid overload.  I will check a CMP and a CBC.  If there is evidence of prerenal azotemia, I would likely decrease her Lasix to 20 mg a day is a chronic long-term dose.  The patient had only been using Metformin as needed.  I explained to the family that this does not work as intended.  Likely the Metformin was causing little or if any benefit to controlling her sugars.  Therefore we will simply monitor her fasting blood sugars and 2-hour postprandial sugars over the next week to 2 weeks.  Ideally I like her blood sugars to be between 100-200.  If not we will gradually uptitrate her insulin accordingly.  They will call me in 1 to 2 weeks with her sugar readings.  Otherwise plan on rechecking in 3 months.   01/06/20 Patient has gained 5 pounds since I saw her last time however there is no pitting edema on her exam today and her lungs are clear to auscultation bilaterally.  She is taking Lasix 20 mg every day however she appears euvolemic.  She denies any orthopnea.  She denies any paroxysmal nocturnal dyspnea.  She denies any chest pain or shortness of breath with activity.  She is taking 30 units of insulin in the morning and 17 units of insulin in the afternoon/evening.  Her fasting blood sugars are typically 120-150.  Her 2-hour postprandial  sugars in the evening are typically 140-170.  The sound outstanding given her age.  She denies any hypoglycemic episodes.  She is due for a Covid booster and she is also due for a flu shot. Past Medical History:  Diagnosis Date  . Acute biliary pancreatitis 07/2002   thia was in 07/2004:she still has pseudocyst in tail of pancreas measuring 54 x 33 mm   . Adrenal adenoma    bilateral  . Anxiety   . CAD (coronary artery disease)    a. s/p CABG in 08/2014 with LIMA-LAD, SVG-OM and SVG-PDA  . Chronic pancreatitis ([place])   . Detached  retina   . Diabetes mellitus ([place])   . Diverticulosis   . History of carcinoma in situ of breast 1988  . Hyperlipidemia   . Hypertension   . IBS (irritable bowel syndrome)   . Legally blind   . Osteoarthritis   . Osteopenia   . Upper GI bleed September2004   secondary to gastritis   Past Surgical History:  Procedure Laterality Date  . CARDIAC CATHETERIZATION N/A 08/24/2014   Procedure: Left Heart Cath and Coronary Angiography;  Surgeon: Leonie Man, MD;  Location: [facility];  Service: Cardiovascular;  Laterality: N/A;  . COLONOSCOPY  08/15/05   few tiny diverticula at sigmoid colon/external hemorrhoids but no polyps  . COLONOSCOPY  01/08/2012   FAO:ZHYQMVH diverticulosis. Next colonoscopy in 12/2016  . CORONARY ARTERY BYPASS GRAFT N/A 08/24/2014   Procedure: CORONARY ARTERY BYPASS GRAFTING (CABG) x three, using left internal mammary artery and right leg greater saphenous vein harvested endoscopically;  Surgeon: Ivin Poot, MD;  Location: [facility];  Service: Open Heart Surgery;  Laterality: N/A;  . ECTOPIC PREGNANCY SURGERY  1950's  . ERCP with sphincterotomy  07/2002  . ESOPHAGOGASTRODUODENOSCOPY      Gastritis of body.  Otherwise normal  . ESOPHAGOGASTRODUODENOSCOPY  01/08/2012   RMR: Few scattered gastric erosions of uncertain significance-status post biopsy. Minimal chronic inflammation, no H.pylori  . LAPAROSCOPIC CHOLECYSTECTOMY    . PANCREATIC PSEUDOCYST DRAINAGE  [id number]   drained percutaneously   . right mastectomy    . tacking up of her bladder    . TEE WITHOUT CARDIOVERSION N/A 08/24/2014   Procedure: TRANSESOPHAGEAL ECHOCARDIOGRAM (TEE);  Surgeon: Ivin Poot, MD;  Location: [facility];  Service: Open Heart Surgery;  Laterality: N/A;   Current Outpatient Medications on File Prior to Visit  Medication Sig Dispense Refill  . amLODipine (NORVASC) 5 MG tablet Take 1 tablet by mouth once daily 90 tablet 3  . aspirin EC 81 MG EC tablet Take 1 tablet (81 mg total) by  mouth daily.    . brimonidine-timolol (COMBIGAN) 0.2-0.5 % ophthalmic solution Place 1 drop into both eyes every 12 (twelve) hours as needed (dry eyes).     . calcium carbonate (TUMS - DOSED IN MG ELEMENTAL CALCIUM) 500 MG chewable tablet Chew 3 tablets by mouth at bedtime.    Marland Kitchen CREON 24000-76000 units CPEP TAKE 2 CAPSULES BY MOUTH WITH MEALS AND 1 CAP WITH SNACKS x 2 300 capsule 11  . cycloSPORINE (RESTASIS) 0.05 % ophthalmic emulsion Place 1 drop into both eyes 3 (three) times daily as needed (for dry eyes).     Noelle Penner ALLERGY RELIEF, CETIRIZINE, 10 MG tablet Take 1 tablet by mouth once daily 90 tablet 0  . ferrous sulfate 325 (65 FE) MG tablet Take 325 mg by mouth daily with breakfast.    . FLUoxetine (PROZAC) 20 MG tablet  Take 1 tablet by mouth once daily 90 tablet 3  . furosemide (LASIX) 20 MG tablet Take 1 tablet (20 mg total) by mouth daily. Can take an extra pill if needed for edema or weight gain. 130 tablet 3  . gabapentin (NEURONTIN) 100 MG capsule TAKE 1 CAPSULE BY MOUTH THREE TIMES DAILY AS NEEDED FOR  NERVE  PAIN 90 capsule 0  . glucose blood (ONE TOUCH ULTRA TEST) test strip Use as directed to monitor FSBS 3 x daily. Dx: E11.9. 100 each 3  . insulin NPH-regular Human (NOVOLIN 70/30 RELION) (70-30) 100 UNIT/ML injection INJECT 25 UNITS SUBCUTANEOUSLY IN THE MORNING AND 15 IN THE EVENING 10 mL 2  . Insulin Syringe-Needle U-100 (RELION INSULIN SYR 0.5ML/31G) 31G X 5/16" 0.5 ML MISC Use as directed to inject insulin SQ BID- patient wants 50U syringe 200 each 3  . Lancets (ONETOUCH DELICA PLUS [id number]) MISC Use as directed. 100 each 1  . Lancets Misc. MISC Pt has Accu Chek Aviva Plus - Needs just lancets  Checks BS 4-5 times per day. Disp 3 boxes(90 day Supply)/4 refills Dx code. E11.9 450 each 3  . LORazepam (ATIVAN) 1 MG tablet TAKE 1/2 TO 1 (ONE-HALF TO ONE) TABLET BY MOUTH TWICE DAILY AS NEEDED 30 tablet 0  . magnesium oxide (MAG-OX) 400 (241.3 Mg) MG tablet Take 1 tablet by mouth once  daily 90 tablet 3  . metoprolol tartrate (LOPRESSOR) 25 MG tablet Take 1 tablet (25 mg total) by mouth 2 (two) times daily. 180 tablet 3  . Multiple Vitamin (MULTIVITAMIN) capsule Take 1 capsule by mouth every morning.     . pantoprazole (PROTONIX) 40 MG tablet Take 1 tablet by mouth once daily 90 tablet 0  . polyethylene glycol (MIRALAX MIX-IN PAX) 17 g packet Take 17 g by mouth daily.    . potassium chloride (KLOR-CON) 10 MEQ tablet TAKE 1 TABLET BY MOUTH ONCE DAILY AS NEEDED. TAKE WITH FUROSEMIDE. 90 tablet 3  . rosuvastatin (CRESTOR) 5 MG tablet Take 1 tablet by mouth once daily 90 tablet 3  . triamcinolone cream (KENALOG) 0.1 % APPLY CREAM EXTERNALLY TWICE DAILY 60 g 0  . Wheat Dextrin (BENEFIBER DRINK MIX) PACK Take 1 Package by mouth daily as needed (for supplement-digestion).      No current facility-administered medications on file prior to visit.   Allergies  Allergen Reactions  . Calcium-Containing Compounds Nausea Only  . Morphine And Related Other (See Comments)    Hallucination  . Raloxifene Itching    Evista- Face and eyes burning  . Vitamin D Analogs Nausea Only   Social History   Socioeconomic History  . Marital status: Widowed    Spouse name: Not on file  . Number of children: 5  . Years of education: Not on file  . Highest education level: Not on file  Occupational History  . Occupation: homemaker  Tobacco Use  . Smoking status: Never Smoker  . Smokeless tobacco: Never Used  Vaping Use  . Vaping Use: Never used  Substance and Sexual Activity  . Alcohol use: No    Alcohol/week: 0.0 standard drinks  . Drug use: No  . Sexual activity: Not Currently    Birth control/protection: Post-menopausal  Other Topics Concern  . Not on file  Social History Narrative   Lives in [place].   Social Determinants of Health   Financial Resource Strain:   . Difficulty of Paying Living Expenses: Not on file  Food Insecurity:   . Worried About  Running Out of Food in  the Last Year: Not on file  . Ran Out of Food in the Last Year: Not on file  Transportation Needs:   . Lack of Transportation (Medical): Not on file  . Lack of Transportation (Non-Medical): Not on file  Physical Activity:   . Days of Exercise per Week: Not on file  . Minutes of Exercise per Session: Not on file  Stress:   . Feeling of Stress : Not on file  Social Connections:   . Frequency of Communication with Friends and Family: Not on file  . Frequency of Social Gatherings with Friends and Family: Not on file  . Attends Religious Services: Not on file  . Active Member of Clubs or Organizations: Not on file  . Attends Archivist Meetings: Not on file  . Marital Status: Not on file  Intimate Partner Violence:   . Fear of Current or Ex-Partner: Not on file  . Emotionally Abused: Not on file  . Physically Abused: Not on file  . Sexually Abused: Not on file         Review of Systems  All other systems reviewed and are negative.      Objective:   Physical Exam Constitutional:      Appearance: Normal appearance.  Cardiovascular:     Rate and Rhythm: Normal rate and regular rhythm.     Heart sounds: Normal heart sounds. No murmur heard.   Pulmonary:     Effort: Pulmonary effort is normal. No respiratory distress.     Breath sounds: Normal breath sounds. No stridor. No wheezing, rhonchi or rales.  Chest:     Chest wall: No tenderness.  Abdominal:     General: Abdomen is flat. Bowel sounds are normal. There is no distension.     Palpations: Abdomen is soft. There is no mass.     Tenderness: There is no abdominal tenderness.  Musculoskeletal:        General: Tenderness present.     Right lower leg: No edema.     Left lower leg: No edema.  Skin:    Findings: No erythema or rash.  Neurological:     General: No focal deficit present.     Mental Status: She is alert and oriented to person, place, and time. Mental status is at baseline.     Cranial Nerves: No  cranial nerve deficit.     Sensory: No sensory deficit.     Motor: Weakness present.     Coordination: Coordination normal.           Assessment & Plan:  Type 2 diabetes mellitus with other specified complication, with long-term current use of insulin (HCC) - Plan: CBC with Differential/Platelet, COMPLETE METABOLIC PANEL WITH GFR, Hemoglobin A1c  Essential hypertension  Stage 3b chronic kidney disease ([place])  Chronic diastolic heart failure ([place])  Patient appears euvolemic today.  Continue Lasix as 20 mg a day.  Check CMP to monitor renal function and potassium.  Patient has underlying stage III chronic kidney disease.  Check CBC to monitor for any anemia secondary to kidney disease.  Diastolic heart failure seems to be under control.  Blood pressure is excellent.  Check A1c.  Given her age, I would be willing to accept a hemoglobin A1c in the sevens.  The reported sugar sound outstanding.  She received her flu shot today.  I recommended a Covid booster.  She also reports a 1 week history of pain behind her right eye  as well as sinus congestion and postnasal drip and headache.  I believe she has developed a sinus infection.  We will try Omnicef 300 mg twice daily for 10 days coupled with Flonase.

## 2020-01-07 LAB — COMPLETE METABOLIC PANEL WITH GFR
AG Ratio: 1.7 (calc) (ref 1.0–2.5)
ALT: 19 U/L (ref 6–29)
AST: 24 U/L (ref 10–35)
Albumin: 4.2 g/dL (ref 3.6–5.1)
Alkaline phosphatase (APISO): 43 U/L (ref 37–153)
BUN/Creatinine Ratio: 23 (calc) — ABNORMAL HIGH (ref 6–22)
BUN: 27 mg/dL — ABNORMAL HIGH (ref 7–25)
CO2: 27 mmol/L (ref 20–32)
Calcium: 9.1 mg/dL (ref 8.6–10.4)
Chloride: 96 mmol/L — ABNORMAL LOW (ref 98–110)
Creat: 1.17 mg/dL — ABNORMAL HIGH (ref 0.60–0.88)
GFR, Est African American: 47 mL/min/{1.73_m2} — ABNORMAL LOW (ref >=60)
GFR, Est Non African American: 41 mL/min/{1.73_m2} — ABNORMAL LOW (ref >=60)
Globulin: 2.5 g/dL (calc) (ref 1.9–3.7)
Glucose, Bld: 160 mg/dL — ABNORMAL HIGH (ref 65–99)
Potassium: 4.7 mmol/L (ref 3.5–5.3)
Sodium: 133 mmol/L — ABNORMAL LOW (ref 135–146)
Total Bilirubin: 0.5 mg/dL (ref 0.2–1.2)
Total Protein: 6.7 g/dL (ref 6.1–8.1)

## 2020-01-07 LAB — CBC WITH DIFFERENTIAL/PLATELET
Absolute Monocytes: 452 cells/uL (ref 200–950)
Basophils Absolute: 31 cells/uL (ref 0–200)
Basophils Relative: 0.6 %
Eosinophils Absolute: 99 cells/uL (ref 15–500)
Eosinophils Relative: 1.9 %
HCT: 34.2 % — ABNORMAL LOW (ref 35.0–45.0)
Hemoglobin: 11.1 g/dL — ABNORMAL LOW (ref 11.7–15.5)
Lymphs Abs: 1581 cells/uL (ref 850–3900)
MCH: 28 pg (ref 27.0–33.0)
MCHC: 32.5 g/dL (ref 32.0–36.0)
MCV: 86.4 fL (ref 80.0–100.0)
MPV: 11.7 fL (ref 7.5–12.5)
Monocytes Relative: 8.7 %
Neutro Abs: 3037 cells/uL (ref 1500–7800)
Neutrophils Relative %: 58.4 %
Platelets: 139 10*3/uL — ABNORMAL LOW (ref 140–400)
RBC: 3.96 10*6/uL (ref 3.80–5.10)
RDW: 13 % (ref 11.0–15.0)
Total Lymphocyte: 30.4 %
WBC: 5.2 10*3/uL (ref 3.8–10.8)

## 2020-01-07 LAB — HEMOGLOBIN A1C
Hgb A1c MFr Bld: 6.4 % of total Hgb — ABNORMAL HIGH (ref <5.7)
Mean Plasma Glucose: 137 (calc)
eAG (mmol/L): 7.6 (calc)

## 2020-01-12 ENCOUNTER — Other Ambulatory Visit: Payer: Self-pay

## 2020-01-12 ENCOUNTER — Encounter (HOSPITAL_COMMUNITY): Payer: Self-pay

## 2020-01-12 ENCOUNTER — Ambulatory Visit (HOSPITAL_COMMUNITY)
Admission: RE | Admit: 2020-01-12 | Discharge: 2020-01-12 | Disposition: A | Discharge status: Home / Self Care | Payer: Medicare HMO | Source: Ambulatory Visit | Attending: Family Medicine | Admitting: Family Medicine

## 2020-01-12 DIAGNOSIS — Z1231 Encounter for screening mammogram for malignant neoplasm of breast: Secondary | ICD-10-CM | POA: Insufficient documentation

## 2020-01-14 DIAGNOSIS — Z794 Long term (current) use of insulin: Secondary | ICD-10-CM | POA: Diagnosis not present

## 2020-01-14 DIAGNOSIS — E1142 Type 2 diabetes mellitus with diabetic polyneuropathy: Secondary | ICD-10-CM | POA: Diagnosis not present

## 2020-01-14 DIAGNOSIS — E261 Secondary hyperaldosteronism: Secondary | ICD-10-CM | POA: Diagnosis not present

## 2020-01-14 DIAGNOSIS — R69 Illness, unspecified: Secondary | ICD-10-CM | POA: Diagnosis not present

## 2020-01-14 DIAGNOSIS — E785 Hyperlipidemia, unspecified: Secondary | ICD-10-CM | POA: Diagnosis not present

## 2020-01-14 DIAGNOSIS — E1165 Type 2 diabetes mellitus with hyperglycemia: Secondary | ICD-10-CM | POA: Diagnosis not present

## 2020-01-14 DIAGNOSIS — E8809 Other disorders of plasma-protein metabolism, not elsewhere classified: Secondary | ICD-10-CM | POA: Diagnosis not present

## 2020-01-14 DIAGNOSIS — I509 Heart failure, unspecified: Secondary | ICD-10-CM | POA: Diagnosis not present

## 2020-01-14 DIAGNOSIS — I110 Hypertensive heart disease with heart failure: Secondary | ICD-10-CM | POA: Diagnosis not present

## 2020-01-20 ENCOUNTER — Other Ambulatory Visit: Payer: Self-pay | Admitting: Family Medicine

## 2020-01-20 NOTE — Telephone Encounter (Signed)
Please Advise

## 2020-01-27 DIAGNOSIS — R69 Illness, unspecified: Secondary | ICD-10-CM | POA: Diagnosis not present

## 2020-01-30 DIAGNOSIS — R69 Illness, unspecified: Secondary | ICD-10-CM | POA: Diagnosis not present

## 2020-02-03 ENCOUNTER — Other Ambulatory Visit: Payer: Self-pay | Admitting: Family Medicine

## 2020-02-10 ENCOUNTER — Other Ambulatory Visit: Payer: Self-pay | Admitting: Family Medicine

## 2020-02-13 NOTE — Telephone Encounter (Signed)
Ok to refill??  Last office visit 01/06/2020.  Last refill 01/20/2020.

## 2020-03-08 ENCOUNTER — Other Ambulatory Visit: Payer: Self-pay | Admitting: Cardiology

## 2020-03-08 ENCOUNTER — Other Ambulatory Visit: Payer: Self-pay | Admitting: Family Medicine

## 2020-03-08 NOTE — Telephone Encounter (Signed)
To PCP

## 2020-03-15 DIAGNOSIS — R69 Illness, unspecified: Secondary | ICD-10-CM | POA: Diagnosis not present

## 2020-03-20 ENCOUNTER — Ambulatory Visit: Payer: Medicare HMO | Attending: Critical Care Medicine

## 2020-03-20 DIAGNOSIS — Z23 Encounter for immunization: Secondary | ICD-10-CM

## 2020-03-20 NOTE — Progress Notes (Signed)
   Covid-19 Vaccination Clinic  Name:  Misty Edwards    MRN: [id number] DOB: 03-10-1927  03/20/2020  [name] was observed post Covid-19 immunization for 15 min without incident. She was provided with Vaccine Information Sheet and instruction to access the V-Safe system.   [name] was instructed to call 911 with any severe reactions post vaccine: Marland Kitchen Difficulty breathing  . Swelling of face and throat  . A fast heartbeat  . A bad rash all over body  . Dizziness and weakness   Immunizations Administered    Name Date Dose VIS Date Route   Moderna Covid-19 Booster Vaccine 03/20/2020 11:30 AM 0.25 mL 01/04/2020 Intramuscular   Manufacturer: Gala Murdoch   Lot: [id number]   NDC: 37628-315-17

## 2020-03-25 ENCOUNTER — Other Ambulatory Visit: Payer: Self-pay | Admitting: Family Medicine

## 2020-04-05 ENCOUNTER — Other Ambulatory Visit: Payer: Self-pay | Admitting: Family Medicine

## 2020-04-05 DIAGNOSIS — N904 Leukoplakia of vulva: Secondary | ICD-10-CM

## 2020-04-05 NOTE — Telephone Encounter (Signed)
Ok to refill Ativan??  Last office visit 01/06/2020.  Last refill 03/08/2020.

## 2020-04-12 ENCOUNTER — Other Ambulatory Visit: Payer: Self-pay | Admitting: Family Medicine

## 2020-04-18 ENCOUNTER — Other Ambulatory Visit: Payer: Self-pay | Admitting: Family Medicine

## 2020-04-18 ENCOUNTER — Other Ambulatory Visit: Payer: Self-pay

## 2020-04-18 MED ORDER — FUROSEMIDE 20 MG PO TABS: 20.0000 mg | ORAL_TABLET | Freq: Every day | ORAL | 0 refills | Status: DC

## 2020-04-30 ENCOUNTER — Other Ambulatory Visit: Payer: Self-pay | Admitting: Family Medicine

## 2020-05-24 ENCOUNTER — Other Ambulatory Visit: Payer: Self-pay | Admitting: Family Medicine

## 2020-05-24 NOTE — Telephone Encounter (Signed)
Ok to refill??  Last office visit 01/06/2020.  Last refill 05/01/2020.

## 2020-06-05 ENCOUNTER — Other Ambulatory Visit: Payer: Self-pay | Admitting: Cardiology

## 2020-06-05 ENCOUNTER — Other Ambulatory Visit: Payer: Self-pay | Admitting: Family Medicine

## 2020-06-05 DIAGNOSIS — F32A Depression, unspecified: Secondary | ICD-10-CM

## 2020-06-11 ENCOUNTER — Other Ambulatory Visit: Payer: Self-pay | Admitting: Family Medicine

## 2020-06-24 ENCOUNTER — Other Ambulatory Visit: Payer: Self-pay | Admitting: Family Medicine

## 2020-06-25 ENCOUNTER — Other Ambulatory Visit: Payer: Self-pay

## 2020-06-25 ENCOUNTER — Telehealth: Payer: Self-pay | Admitting: Family Medicine

## 2020-06-25 MED ORDER — AMLODIPINE BESYLATE 5 MG PO TABS: 1.0000 | ORAL_TABLET | Freq: Every day | ORAL | 3 refills | Status: DC

## 2020-06-25 MED ORDER — MAGNESIUM OXIDE 400 (241.3 MG) MG PO TABS: 1.0000 | ORAL_TABLET | Freq: Every day | ORAL | 3 refills | Status: DC

## 2020-06-25 NOTE — Telephone Encounter (Signed)
Refilled amlodipine and magnesium

## 2020-06-25 NOTE — Progress Notes (Signed)
  Chronic Care Management   Note  06/25/2020 Name: DELCIE RUPPERT MRN: [id number] DOB: October 23, 1926  [name] is a 87 y.o. year old female who is a primary care patient of Pickard, Cammie Mcgee, MD. I reached out to [name] by phone today in response to a referral sent by Ms. Elberta Leatherwood Zanders's PCP, Susy Frizzle, MD.   Ms. Bromell was given information about Chronic Care Management services today including:  1. CCM service includes personalized support from designated clinical staff supervised by her physician, including individualized plan of care and coordination with other care providers 2. 24/7 contact phone numbers for assistance for urgent and routine care needs. 3. Service will only be billed when office clinical staff spend 20 minutes or more in a month to coordinate care. 4. Only one practitioner may furnish and bill the service in a calendar month. 5. The patient may stop CCM services at any time (effective at the end of the month) by phone call to the office staff.   Patient agreed to services and verbal consent obtained.   Follow up plan:   Carley Perdue [employer] Scheduler

## 2020-06-26 ENCOUNTER — Other Ambulatory Visit: Payer: Self-pay | Admitting: *Deleted

## 2020-06-26 MED ORDER — CYCLOSPORINE 0.05 % OP EMUL: 1.0000 [drp] | Freq: Three times a day (TID) | OPHTHALMIC | 3 refills | Status: DC | PRN

## 2020-06-26 MED ORDER — CREON 24000-76000 UNITS PO CPEP: ORAL_CAPSULE | ORAL | 11 refills | Status: DC

## 2020-06-27 ENCOUNTER — Telehealth: Payer: Self-pay

## 2020-06-27 ENCOUNTER — Telehealth: Payer: Self-pay | Admitting: *Deleted

## 2020-06-27 MED ORDER — AMLODIPINE BESYLATE 5 MG PO TABS: 1.0000 | ORAL_TABLET | Freq: Every day | ORAL | 3 refills | Status: DC

## 2020-06-27 NOTE — Telephone Encounter (Signed)
Medication refill request for Amlodipine 5 mg tablet approved and sent to [facility].

## 2020-06-27 NOTE — Telephone Encounter (Signed)
Received request from pharmacy for PA on Restasis.  PA submitted.   Dx: B83.77- dry eye syndrome.  Your information has been submitted to [place] Medicare Part D. [employer] will review the request and will issue a decision, typically within 1-3 days from your submission. You can check the updated outcome later by reopening this request.  If [employer] has not responded in 1-3 days or if you have any questions about your ePA request, please contact [place] Medicare Part D at [phone]. If you think there may be a problem with your PA request, use our live chat feature at the bottom right.

## 2020-06-27 NOTE — Telephone Encounter (Signed)
Received PA determination.   PA approved 03/17/2020 - 03/16/2021.

## 2020-06-28 ENCOUNTER — Other Ambulatory Visit: Payer: Self-pay | Admitting: Family Medicine

## 2020-06-28 NOTE — Telephone Encounter (Signed)
Ok to refill??  Last office visit 01/06/2020.  Last refill 05/24/2020.

## 2020-06-30 ENCOUNTER — Other Ambulatory Visit: Payer: Self-pay | Admitting: Family Medicine

## 2020-07-02 ENCOUNTER — Telehealth: Payer: Self-pay | Admitting: Family Medicine

## 2020-07-02 MED ORDER — CYCLOSPORINE 0.05 % OP EMUL: 1.0000 [drp] | Freq: Two times a day (BID) | OPHTHALMIC | 6 refills | Status: DC

## 2020-07-02 MED ORDER — "INSULIN SYRINGE-NEEDLE U-100 31G X 5/16"" 0.5 ML MISC": 0 refills | Status: DC

## 2020-07-02 NOTE — Telephone Encounter (Signed)
Received voicemail from pharmacy regarding patient; no details in message. Please advise at [phone].

## 2020-07-02 NOTE — Telephone Encounter (Signed)
Call placed to pharmacy.  Requested clarification on Restasis and BD insulin needles.   New prescriptions given.

## 2020-07-05 ENCOUNTER — Ambulatory Visit (INDEPENDENT_AMBULATORY_CARE_PROVIDER_SITE_OTHER): Payer: Medicare HMO | Admitting: Cardiology

## 2020-07-05 ENCOUNTER — Other Ambulatory Visit: Payer: Self-pay | Admitting: Family Medicine

## 2020-07-05 ENCOUNTER — Other Ambulatory Visit: Payer: Self-pay

## 2020-07-05 VITALS — BP 136/58 | HR 66 | Ht 63.0 in | Wt 150.0 lb

## 2020-07-05 DIAGNOSIS — I2510 Atherosclerotic heart disease of native coronary artery without angina pectoris: Secondary | ICD-10-CM

## 2020-07-05 DIAGNOSIS — I10 Essential (primary) hypertension: Secondary | ICD-10-CM

## 2020-07-05 DIAGNOSIS — E782 Mixed hyperlipidemia: Secondary | ICD-10-CM | POA: Diagnosis not present

## 2020-07-05 NOTE — Patient Instructions (Signed)

## 2020-07-05 NOTE — Progress Notes (Signed)
Clinical Summary Misty Edwards is a 87 y.o.female seen today for follow up of the following medical problems.   1. CAD - history of emergent CABG June 2016 after presenting with NSTEMI -t echo 06/2015 LVEF 60-65%, grade II diastolic dysfunction - beta blocker dosing limited due to bradycardia.     - no recent chest pain - no SOB or DOE - compliant with meds  2. HTN - very lenient with her bp's due to chronic orthostatic symptoms and recurrent falls   - remains with compliatnw with meds  3. Hyperlipidemia -07/2016 TC 106 TG 85 HDL 67 LDL 22   - labs followed by pcp - remains on statin  4. Mitral regurgitation - moderate by echo 06/2015. - 10/2016 mild MR.  08/2018 echo: LVEF 60-65%, mild MR.  - 09/2019 LVEF 55%, mild MR  5. Falls - 3 falls over the last 3 weeks - last episode 3 weeks ago. Outside turnig off water. Tripped on wood timber.  - another fall after getting up to walk to bathroom. Had some diarrhea. Sitting on commode, stood up. Thinks maybe leg went to sleep, but not sure - another episode got out of bed. Felt dizzy with standing, fell to floor.  - admit 08/2018 with fall and right acetabular fracture.  - no recent falls   Past Medical History:  Diagnosis Date  . Acute biliary pancreatitis 07/2002   thia was in 07/2004:she still has pseudocyst in tail of pancreas measuring 54 x 33 mm   . Adrenal adenoma    bilateral  . Anxiety   . CAD (coronary artery disease)    a. s/p CABG in 08/2014 with LIMA-LAD, SVG-OM and SVG-PDA  . Chronic pancreatitis ([place])   . Detached retina   . Diabetes mellitus ([place])   . Diverticulosis   . History of carcinoma in situ of breast 1988  . Hyperlipidemia   . Hypertension   . IBS (irritable bowel syndrome)   . Legally blind   . Osteoarthritis   . Osteopenia   . Upper GI bleed September2004   secondary to gastritis     Allergies  Allergen Reactions  . Calcium-Containing Compounds Nausea Only  . Morphine  And Related Other (See Comments)    Hallucination  . Raloxifene Itching    Evista- Face and eyes burning  . Vitamin D Analogs Nausea Only     Current Outpatient Medications  Medication Sig Dispense Refill  . amLODipine (NORVASC) 5 MG tablet Take 1 tablet (5 mg total) by mouth daily. 90 tablet 3  . aspirin EC 81 MG EC tablet Take 1 tablet (81 mg total) by mouth daily.    . brimonidine-timolol (COMBIGAN) 0.2-0.5 % ophthalmic solution Place 1 drop into both eyes every 12 (twelve) hours as needed (dry eyes).     . calcium carbonate (TUMS - DOSED IN MG ELEMENTAL CALCIUM) 500 MG chewable tablet Chew 3 tablets by mouth at bedtime.    . cefdinir (OMNICEF) 300 MG capsule Take 1 capsule (300 mg total) by mouth 2 (two) times daily. 20 capsule 0  . cetirizine (ZYRTEC) 10 MG tablet Take 1 tablet by mouth once daily 90 tablet 0  . CREON 24000-76000 units CPEP TAKE 2 CAPSULES BY MOUTH WITH MEALS AND 1 CAP WITH SNACKS x 2 300 capsule 11  . cycloSPORINE (RESTASIS) 0.05 % ophthalmic emulsion Place 1 drop into both eyes 2 (two) times daily. 3 mL 6  . ferrous sulfate 325 (65 FE) MG tablet Take  325 mg by mouth daily with breakfast.    . FLUoxetine (PROZAC) 20 MG tablet Take 1 tablet by mouth once daily 90 tablet 0  . fluticasone (FLONASE) 50 MCG/ACT nasal spray Place 2 sprays into both nostrils daily. 16 g 6  . furosemide (LASIX) 20 MG tablet Take 1 tablet (20 mg total) by mouth daily. 90 tablet 0  . gabapentin (NEURONTIN) 100 MG capsule TAKE 1 CAPSULE BY MOUTH THREE TIMES DAILY AS NEEDED FOR  NERVE  PAIN 90 capsule 3  . glucose blood (ONE TOUCH ULTRA TEST) test strip Use as directed to monitor FSBS 3 x daily. Dx: E11.9. 100 each 3  . insulin NPH-regular Human (NOVOLIN 70/30 RELION) (70-30) 100 UNIT/ML injection INJECT 25 UNITS SUBCUTANEOUSLY IN THE MORNING AND 15 IN THE EVENING 10 mL 2  . Insulin Syringe-Needle U-100 (BD INSULIN SYRINGE U/F) 31G X 5/16" 0.5 ML MISC USE AS DIRECTED TO INJECT INSULIN SQ 2X DAILY.  500 each 0  . Lancets (ONETOUCH DELICA PLUS [id number]) MISC Use as directed. 100 each 1  . Lancets Misc. MISC Pt has Accu Chek Aviva Plus - Needs just lancets  Checks BS 4-5 times per day. Disp 3 boxes(90 day Supply)/4 refills Dx code. E11.9 450 each 3  . LORazepam (ATIVAN) 1 MG tablet TAKE 1/2 TO 1 (ONE-HALF TO ONE) TABLET BY MOUTH TWICE DAILY AS NEEDED 30 tablet 0  . magnesium oxide (MAG-OX) 400 (241.3 Mg) MG tablet Take 1 tablet (400 mg total) by mouth daily. 90 tablet 3  . metoprolol tartrate (LOPRESSOR) 25 MG tablet Take 1 tablet (25 mg total) by mouth 2 (two) times daily. 180 tablet 3  . Multiple Vitamin (MULTIVITAMIN) capsule Take 1 capsule by mouth every morning.     . pantoprazole (PROTONIX) 40 MG tablet Take 1 tablet by mouth once daily 90 tablet 3  . polyethylene glycol (MIRALAX MIX-IN PAX) 17 g packet Take 17 g by mouth daily.    . potassium chloride (KLOR-CON) 10 MEQ tablet TAKE 1 TABLET BY MOUTH ONCE DAILY AS NEEDED -  TAKE  WITH  FUROSEMIDE 90 tablet 0  . rosuvastatin (CRESTOR) 5 MG tablet Take 1 tablet by mouth once daily 90 tablet 3  . triamcinolone (KENALOG) 0.1 % APPLY CREAM EXTERNALLY TWICE DAILY 60 g 0  . Wheat Dextrin (BENEFIBER DRINK MIX) PACK Take 1 Package by mouth daily as needed (for supplement-digestion).      No current facility-administered medications for this visit.     Past Surgical History:  Procedure Laterality Date  . CARDIAC CATHETERIZATION N/A 08/24/2014   Procedure: Left Heart Cath and Coronary Angiography;  Surgeon: Leonie Man, MD;  Location: [facility];  Service: Cardiovascular;  Laterality: N/A;  . COLONOSCOPY  08/15/05   few tiny diverticula at sigmoid colon/external hemorrhoids but no polyps  . COLONOSCOPY  01/08/2012   [id number] diverticulosis. Next colonoscopy in 12/2016  . CORONARY ARTERY BYPASS GRAFT N/A 08/24/2014   Procedure: CORONARY ARTERY BYPASS GRAFTING (CABG) x three, using left internal mammary artery and right leg greater  saphenous vein harvested endoscopically;  Surgeon: Ivin Poot, MD;  Location: [place] [facility];  Service: Open Heart Surgery;  Laterality: N/A;  . ECTOPIC PREGNANCY SURGERY  1950's  . ERCP with sphincterotomy  07/2002  . ESOPHAGOGASTRODUODENOSCOPY      Gastritis of body.  Otherwise normal  . ESOPHAGOGASTRODUODENOSCOPY  01/08/2012   RMR: Few scattered gastric erosions of uncertain significance-status post biopsy. Minimal chronic inflammation, no H.pylori  . LAPAROSCOPIC  CHOLECYSTECTOMY    . MASTECTOMY Right 1980  . PANCREATIC PSEUDOCYST DRAINAGE  [id number]   drained percutaneously   . right mastectomy    . tacking up of her bladder    . TEE WITHOUT CARDIOVERSION N/A 08/24/2014   Procedure: TRANSESOPHAGEAL ECHOCARDIOGRAM (TEE);  Surgeon: Ivin Poot, MD;  Location: [place];  Service: Open Heart Surgery;  Laterality: N/A;     Allergies  Allergen Reactions  . Calcium-Containing Compounds Nausea Only  . Morphine And Related Other (See Comments)    Hallucination  . Raloxifene Itching    Evista- Face and eyes burning  . Vitamin D Analogs Nausea Only      Family History  Problem Relation Age of Onset  . Ovarian cancer Mother        passed  . Colon cancer Father 39       passed away in his 22's  . Colon cancer Brother 105       surgery inhis 50's doing well now  . Heart attack Brother        passed away age 12  . Pancreatitis Brother   . Kidney disease Daughter   . Leukemia Son      Social History Misty Edwards reports that she has never smoked. She has never used smokeless tobacco. Misty Edwards reports no history of alcohol use.   Review of Systems CONSTITUTIONAL: No weight loss, fever, chills, weakness or fatigue.  HEENT: Eyes: No visual loss, blurred vision, double vision or yellow sclerae.No hearing loss, sneezing, congestion, runny nose or sore throat.  SKIN: No rash or itching.  CARDIOVASCULAR: per hpi RESPIRATORY: No shortness of breath, cough or sputum.  GASTROINTESTINAL: No  anorexia, nausea, vomiting or diarrhea. No abdominal pain or blood.  GENITOURINARY: No burning on urination, no polyuria NEUROLOGICAL: No headache, dizziness, syncope, paralysis, ataxia, numbness or tingling in the extremities. No change in bowel or bladder control.  MUSCULOSKELETAL: No muscle, back pain, joint pain or stiffness.  LYMPHATICS: No enlarged nodes. No history of splenectomy.  PSYCHIATRIC: No history of depression or anxiety.  ENDOCRINOLOGIC: No reports of sweating, cold or heat intolerance. No polyuria or polydipsia.  Marland Kitchen   Physical Examination Today's Vitals   07/05/20 1414  BP: (!) 136/58  Pulse: 66  SpO2: 93%  Weight: 150 lb (68 kg)  Height: 5\' 3"  (1.6 m)   Body mass index is 26.57 kg/m.  Gen: resting comfortably, no acute distress HEENT: no scleral icterus, pupils equal round and reactive, no palptable cervical adenopathy,  CV: RRR,  No m/rg, no jvd Resp: Clear to auscultation bilaterally GI: abdomen is soft, non-tender, non-distended, normal bowel sounds, no hepatosplenomegaly MSK: extremities are warm, no edema.  Skin: warm, no rash Neuro:  no focal deficits Psych: appropriate affect    Assessment and Plan  1. CAD - no recent symptoms, continue current meds   2. HTN - overall at goal, lenient bp management given prior orthostatic dizziness and falls in the past  3. Hyperlipidemia - continue statin - if pcp has not repeated by next visit would order     Arnoldo Lenis, M.D

## 2020-07-22 ENCOUNTER — Other Ambulatory Visit: Payer: Self-pay | Admitting: Family Medicine

## 2020-07-26 ENCOUNTER — Telehealth: Payer: Self-pay | Admitting: Pharmacist

## 2020-07-26 NOTE — Progress Notes (Addendum)
Chronic Care Management Pharmacy Assistant   Name: Misty Edwards  MRN: [id number] DOB: 1926-11-14  Misty Edwards is an 87 y.o. year old female who presents for his initial CCM visit with the clinical pharmacist.  Reason for Encounter: Chart Prep  Conditions to be addressed/monitored:  HTN, GERD, Type 2 DM.   Primary concerns for visit include: HTN  Recent office visits:  None in the last 6 months  Recent consult visits:  07/05/20 Cardiology Branch, Alphonse Guild, MD. For coronary artery disease. STOPPED Cefdinir.   Hospital visits:  None in previous 6 months   Medication History: Gabapentin 100 mg 15 DS 07/06/20 Rosuvastatin 5 mg 90 DS 06/05/20  Medications: Outpatient Encounter Medications as of 07/26/2020  Medication Sig   amLODipine (NORVASC) 5 MG tablet Take 1 tablet (5 mg total) by mouth daily.   aspirin EC 81 MG EC tablet Take 1 tablet (81 mg total) by mouth daily.   brimonidine-timolol (COMBIGAN) 0.2-0.5 % ophthalmic solution Place 1 drop into both eyes every 12 (twelve) hours as needed (dry eyes).    calcium carbonate (TUMS - DOSED IN MG ELEMENTAL CALCIUM) 500 MG chewable tablet Chew 3 tablets by mouth at bedtime.   cetirizine (ZYRTEC) 10 MG tablet Take 1 tablet by mouth once daily   CREON 24000-76000 units CPEP TAKE 2 CAPSULES BY MOUTH WITH MEALS AND 1 CAP WITH SNACKS x 2   cycloSPORINE (RESTASIS) 0.05 % ophthalmic emulsion Place 1 drop into both eyes 2 (two) times daily.   ferrous sulfate 325 (65 FE) MG tablet Take 325 mg by mouth daily with breakfast.   FLUoxetine (PROZAC) 20 MG tablet Take 1 tablet by mouth once daily   fluticasone (FLONASE) 50 MCG/ACT nasal spray Place 2 sprays into both nostrils daily.   furosemide (LASIX) 20 MG tablet Take 1 tablet (20 mg total) by mouth daily.   gabapentin (NEURONTIN) 100 MG capsule TAKE 1 CAPSULE BY MOUTH THREE TIMES DAILY AS NEEDED FOR  NERVE  PAIN   glucose blood (ONE TOUCH ULTRA TEST) test strip Use as directed to  monitor FSBS 3 x daily. Dx: E11.9.   insulin NPH-regular Human (NOVOLIN 70/30 RELION) (70-30) 100 UNIT/ML injection INJECT 25 UNITS SUBCUTANEOUSLY IN THE MORNING AND 15 IN THE EVENING   Insulin Syringe-Needle U-100 (BD INSULIN SYRINGE U/F) 31G X 5/16" 0.5 ML MISC USE AS DIRECTED TO INJECT INSULIN SQ 2X DAILY.   Lancets (ONETOUCH DELICA PLUS [id number]) MISC Use as directed.   Lancets Misc. MISC Pt has Accu Chek Aviva Plus - Needs just lancets  Checks BS 4-5 times per day. Disp 3 boxes(90 day Supply)/4 refills Dx code. E11.9   LORazepam (ATIVAN) 1 MG tablet TAKE 1/2 TO 1 (ONE-HALF TO ONE) TABLET BY MOUTH TWICE DAILY AS NEEDED   magnesium oxide (MAG-OX) 400 (241.3 Mg) MG tablet Take 1 tablet (400 mg total) by mouth daily.   metoprolol tartrate (LOPRESSOR) 25 MG tablet Take 1 tablet (25 mg total) by mouth 2 (two) times daily.   Multiple Vitamin (MULTIVITAMIN) capsule Take 1 capsule by mouth every morning.    pantoprazole (PROTONIX) 40 MG tablet Take 1 tablet by mouth once daily   polyethylene glycol (MIRALAX / GLYCOLAX) 17 g packet Take 17 g by mouth daily.   potassium chloride (KLOR-CON) 10 MEQ tablet TAKE 1 TABLET BY MOUTH ONCE DAILY AS NEEDED -  TAKE  WITH  FUROSEMIDE   rosuvastatin (CRESTOR) 5 MG tablet Take 1 tablet by mouth once daily  triamcinolone (KENALOG) 0.1 % APPLY CREAM EXTERNALLY TWICE DAILY   Wheat Dextrin (BENEFIBER DRINK MIX) PACK Take 1 Package by mouth daily as needed (for supplement-digestion).    No facility-administered encounter medications on file as of 07/26/2020.    Have you seen any other providers since your last visit? Patients daughter stated no.  Any changes in your medications or health? Patients daughter stated no.  Any side effects from any medications? Patients daughter stated no.  Do you have an symptoms or problems not managed by your medications? Patients daughter stated her mom lorazepam is not helping her sleep.  Any concerns about your health right now?  Patients daughter stated she is concerned about her moms fluid retention.   Has your provider asked that you check blood pressure, blood sugar, or follow special diet at home? Patients daughter stated she monitors her blood pressure, blood sugar, she is on meals on wheels most of the time.   Do you get any type of exercise on a regular basis? Patients daughter stated she does yard work.   Can you think of a goal you would like to reach for your health? Patients daughter stated for her mom to be able to be more independent.   Do you have any problems getting your medications? Patients daughter stated her mothers Combigan and Restasis is very expensive. Patients daughter stated her mom is not getting a full 30 DS of metoprolol, gabapentin, potassium, furosemide.   Is there anything that you would like to discuss during the appointment? Patients daughter stated no.  Please bring medications and supplements to appointment, patients reminded of 5/16 face to face at 2 pm.  [place], [place] Pharmacist Assistant [phone]

## 2020-07-27 NOTE — Progress Notes (Deleted)
Chronic Care Management Pharmacy Note  07/27/2020 Name:  Misty Edwards MRN:  [id number] DOB:  1926-11-04  Subjective: Misty Edwards is an 87 y.o. year old female who is a primary patient of Pickard, Cammie Mcgee, MD.  The CCM team was consulted for assistance with disease management and care coordination needs.    Engaged with patient face to face for initial visit in response to provider referral for pharmacy case management and/or care coordination services.   Consent to Services:  The patient was given the following information about Chronic Care Management services today, agreed to services, and gave verbal consent: 1. CCM service includes personalized support from designated clinical staff supervised by the primary care provider, including individualized plan of care and coordination with other care providers 2. 24/7 contact phone numbers for assistance for urgent and routine care needs. 3. Service will only be billed when office clinical staff spend 20 minutes or more in a month to coordinate care. 4. Only one practitioner may furnish and bill the service in a calendar month. 5.The patient may stop CCM services at any time (effective at the end of the month) by phone call to the office staff. 6. The patient will be responsible for cost sharing (co-pay) of up to 20% of the service fee (after annual deductible is met). Patient agreed to services and consent obtained.  Patient Care Team: Susy Frizzle, MD as PCP - General (Family Medicine) Harl Bowie Alphonse Guild, MD as PCP - Cardiology (Cardiology) Gala Romney Cristopher Estimable, MD (Gastroenterology) Edythe Clarity, [facility] as Pharmacist (Pharmacist)  Recent office visits: None in previous 6 months  Recent consult visits: 07/05/20 Cardiology Branch, Alphonse Guild, MD. For coronary artery disease. STOPPED Cefdinir.  Hospital visits: None in previous 6 months  Objective:  Lab Results  Component Value Date   CREATININE 1.17 (H) 01/06/2020   BUN 27  (H) 01/06/2020   GFRNONAA 41 (L) 01/06/2020   GFRAA 47 (L) 01/06/2020   NA 133 (L) 01/06/2020   K 4.7 01/06/2020   CALCIUM 9.1 01/06/2020   CO2 27 01/06/2020   GLUCOSE 160 (H) 01/06/2020    Lab Results  Component Value Date/Time   HGBA1C 6.4 (H) 01/06/2020 02:24 PM   HGBA1C 6.4 (H) 09/26/2019 05:57 AM   MICROALBUR 1.6 08/01/2016 08:19 AM   MICROALBUR 1.0 01/13/2014 09:07 AM    Last diabetic Eye exam: No results found for: HMDIABEYEEXA  Last diabetic Foot exam: No results found for: HMDIABFOOTEX   Lab Results  Component Value Date   CHOL 106 08/01/2016   HDL 67 08/01/2016   LDLCALC 22 08/01/2016   TRIG 85 08/01/2016   CHOLHDL 1.6 08/01/2016    Hepatic Function Latest Ref Rng & Units 01/06/2020 10/04/2019 11/01/2018  Total Protein 6.1 - 8.1 g/dL 6.7 7.5 6.6  Albumin 3.5 - 5.0 g/dL - - -  AST 10 - 35 U/L _0 ALT 6 - 29 U/L _1 Alk Phosphatase 38 - 126 U/L - - -  Total Bilirubin 0.2 - 1.2 mg/dL 0.5 0.8 0.6  Bilirubin, Direct 0.0 - 0.3 mg/dL - - -    Lab Results  Component Value Date/Time   TSH 0.099 (L) 10/23/2016 11:40 AM   TSH 0.188 (L) 09/27/2016 06:16 AM   TSH 0.753 08/21/2011 09:34 AM    CBC Latest Ref Rng & Units 01/06/2020 10/04/2019 09/23/2019  WBC 3.8 - 10.8 Thousand/uL 5.2 6.2 -  Hemoglobin 11.7 - 15.5 g/dL 11.1(L) 11.9 12.9  Hematocrit 35.0 - 45.0 % 34.2(L) 35.6 38.0  Platelets 140 - 400 Thousand/uL 139(L) 182 -    No results found for: VD25OH  Clinical ASCVD: {YES/NO:21197} The ASCVD Risk score Mikey Bussing DC Jr., et al., 2013) failed to calculate for the following reasons:   The 2013 ASCVD risk score is only valid for ages 56 to 55   The patient has a prior MI or stroke diagnosis    Depression screen [facility] 2/9 04/18/2019 04/29/2018 11/14/2016  Decreased Interest 0 0 0  Down, Depressed, Hopeless 0 0 0  PHQ - 2 Score 0 0 0  Altered sleeping - - 0  Tired, decreased energy - - 0  Change in appetite - - 0  Feeling bad or failure about yourself  - - 0   Trouble concentrating - - 0  Moving slowly or fidgety/restless - - 0  Suicidal thoughts - - 0  PHQ-9 Score - - 0  Difficult doing work/chores - - Not difficult at all  Some recent data might be hidden     ***Other: (CHADS2VASc if Afib, MMRC or CAT for COPD, ACT, DEXA)  Social History   Tobacco Use  Smoking Status Never Smoker  Smokeless Tobacco Never Used   BP Readings from Last 3 Encounters:  07/05/20 (!) 136/58  01/06/20 130/70  01/04/20 138/60   Pulse Readings from Last 3 Encounters:  07/05/20 66  01/06/20 61  01/04/20 (!) 56   Wt Readings from Last 3 Encounters:  07/05/20 150 lb (68 kg)  01/06/20 146 lb (66.2 kg)  01/04/20 143 lb 3.2 oz (65 kg)   BMI Readings from Last 3 Encounters:  07/05/20 26.57 kg/m  01/06/20 25.86 kg/m  01/04/20 25.37 kg/m    Assessment/Interventions: Review of patient past medical history, allergies, medications, health status, including review of consultants reports, laboratory and other test data, was performed as part of comprehensive evaluation and provision of chronic care management services.   SDOH:  (Social Determinants of Health) assessments and interventions performed: {yes/no:20286}  SDOH Screenings   Alcohol Screen: Not on file  Depression (HYI5-0): Not on file  Financial Resource Strain: Not on file  Food Insecurity: Not on file  Housing: Not on file  Physical Activity: Not on file  Social Connections: Not on file  Stress: Not on file  Tobacco Use: Low Risk   . Smoking Tobacco Use: Never Smoker  . Smokeless Tobacco Use: Never Used  Transportation Needs: Not on file    CCM Care Plan  Allergies  Allergen Reactions  . Calcium-Containing Compounds Nausea Only  . Morphine And Related Other (See Comments)    Hallucination  . Raloxifene Itching    Evista- Face and eyes burning  . Vitamin D Analogs Nausea Only    Medications Reviewed Today    Reviewed by Bernita Raisin, RN (Registered Nurse) on 07/05/20  at [place] List Status: <None>  Medication Order Taking? Sig Documenting Provider Last Dose Status Informant  amLODipine (NORVASC) 5 MG tablet [id number] Yes Take 1 tablet (5 mg total) by mouth daily. Arnoldo Lenis, MD Taking Active   aspirin EC 81 MG EC tablet [id number] Yes Take 1 tablet (81 mg total) by mouth daily. John Giovanni, PA-C Taking Active Family Member  brimonidine-timolol (COMBIGAN) 0.2-0.5 % ophthalmic solution [id number] Yes Place 1 drop into both eyes every 12 (twelve) hours as needed (dry eyes).  [provider] Taking Active Family Member  calcium carbonate (TUMS - DOSED IN MG ELEMENTAL CALCIUM) 500  MG chewable tablet [id number] Yes Chew 3 tablets by mouth at bedtime. [provider] Taking Active Family Member  cetirizine (ZYRTEC) 10 MG tablet [id number] Yes Take 1 tablet by mouth once daily Susy Frizzle, MD Taking Active   CREON 24000-76000 units CPEP [id number] Yes TAKE 2 CAPSULES BY MOUTH WITH MEALS AND 1 CAP WITH SNACKS x 2 Susy Frizzle, MD Taking Active   cycloSPORINE (RESTASIS) 0.05 % ophthalmic emulsion [id number] Yes Place 1 drop into both eyes 2 (two) times daily. Susy Frizzle, MD Taking Active   ferrous sulfate 325 (65 FE) MG tablet [id number] Yes Take 325 mg by mouth daily with breakfast. [provider] Taking Active Family Member           Med Note Jannifer Franklin, Sammuel Cooper May 11, 2017  2:34 PM)    FLUoxetine (PROZAC) 20 MG tablet [id number] Yes Take 1 tablet by mouth once daily Susy Frizzle, MD Taking Active   fluticasone [facility]) 50 MCG/ACT nasal spray [id number] Yes Place 2 sprays into both nostrils daily. Susy Frizzle, MD Taking Active   furosemide (LASIX) 20 MG tablet [id number] Yes Take 1 tablet (20 mg total) by mouth daily. Susy Frizzle, MD Taking Active   gabapentin (NEURONTIN) 100 MG capsule [id number] Yes TAKE 1 CAPSULE BY MOUTH THREE TIMES DAILY AS NEEDED FOR  NERVE  PAIN Susy Frizzle, MD Taking Active    glucose blood (ONE TOUCH ULTRA TEST) test strip [id number] Yes Use as directed to monitor FSBS 3 x daily. Dx: E11.9. Susy Frizzle, MD Taking Active   insulin NPH-regular Human (NOVOLIN 70/30 RELION) (70-30) 100 UNIT/ML injection [id number] Yes INJECT 25 UNITS SUBCUTANEOUSLY IN THE MORNING AND 15 IN THE Reita Chard, MD Taking Active   Insulin Syringe-Needle U-100 (BD INSULIN SYRINGE U/F) 31G X 5/16" 0.5 ML MISC [id number] Yes USE AS DIRECTED TO INJECT INSULIN SQ 2X DAILY. Susy Frizzle, MD Taking Active   Lancets (ONETOUCH DELICA PLUS [id number]) [place] [id number] Yes Use as directed. Susy Frizzle, MD Taking Active   Lancets Misc. [place] [id number] Yes Pt has Accu Chek Aviva Plus - Needs just lancets  Checks BS 4-5 times per day. Disp 3 boxes(90 day Supply)/4 refills Dx code. E11.9 Susy Frizzle, MD Taking Active   LORazepam (ATIVAN) 1 MG tablet [id number] Yes TAKE 1/2 TO 1 (ONE-HALF TO ONE) TABLET BY MOUTH TWICE DAILY AS NEEDED Susy Frizzle, MD Taking Active   magnesium oxide (MAG-OX) 400 (241.3 Mg) MG tablet [id number] Yes Take 1 tablet (400 mg total) by mouth daily. Arnoldo Lenis, MD Taking Active   metoprolol tartrate (LOPRESSOR) 25 MG tablet [id number] Yes Take 1 tablet (25 mg total) by mouth 2 (two) times daily. Erma Heritage, PA-C Taking Active   Multiple Vitamin (MULTIVITAMIN) capsule [id number] Yes Take 1 capsule by mouth every morning.  [provider] Taking Active Family Member  pantoprazole (PROTONIX) 40 MG tablet [id number] Yes Take 1 tablet by mouth once daily Branch, Alphonse Guild, MD Taking Active   polyethylene glycol (MIRALAX / GLYCOLAX) 17 g packet [id number] Yes Take 17 g by mouth daily. [provider] Taking Active   potassium chloride (KLOR-CON) 10 MEQ tablet [id number] Yes TAKE 1 TABLET BY MOUTH ONCE DAILY AS NEEDED -  TAKE  WITH  FUROSEMIDE Susy Frizzle, MD Taking Active   rosuvastatin (CRESTOR) 5 MG tablet [id number] Yes Take 1  tablet by mouth once daily Branch, Alphonse Guild, MD  Taking Active Family Member  triamcinolone (KENALOG) 0.1 % [id number] Yes [place] Susy Frizzle, MD Taking Active   Wheat Dextrin ([place]) [place] Mast [id number] Yes Take 1 Package by mouth daily as needed (for supplement-digestion).  [provider] Taking Active Family Member          Patient Active Problem List   Diagnosis Date Noted  . Acute pulmonary edema (HCC)   . Acute respiratory failure ([place]) 09/23/2019  . Palliative care by specialist   . Advanced care planning/counseling discussion   . Goals of care, counseling/discussion   . Disorientation   . AKI (acute kidney injury) ([place]) 09/13/2018  . Hyponatremia 09/13/2018  . Herpes zoster ophthalmicus 09/03/2018  . Right acetabular fracture ([place]) 09/03/2018  . Syncope and collapse 09/03/2018  . Protein-calorie malnutrition, severe 09/03/2018  . Vaginal atrophy 04/29/2018  . Angiokeratoma 04/29/2018  . PMB (postmenopausal bleeding) 04/29/2018  . Acute on chronic diastolic CHF (congestive heart failure) ([place]) 10/23/2016  . Acute respiratory failure with hypoxia ([place]) 10/23/2016  . Nephrolithiasis 09/27/2016  . Hydronephrosis with renal and ureteral calculus obstruction 09/27/2016  . Hydronephrosis 09/27/2016  . Heme positive stool 09/05/2016  . Dyslipidemia 03/07/2015  . Type 2 diabetes mellitus with vascular disease ([place]) 03/07/2015  . Anemia 09/26/2014  . Lesion of lip 09/25/2014  . S/P CABG x 3 08/25/2014  . NSTEMI (non-ST elevated myocardial infarction) ([place]) 08/24/2014  . Acute coronary syndrome ([place])   . IBS (irritable bowel syndrome) 08/26/2012  . Thrombocytopenia ([place]) 02/02/2012  . Epigastric pain 02/02/2012  . Gastritis 02/02/2012  . Abdominal pain 12/17/2011  . Normocytic anemia 12/17/2011  . History of colon polyps 12/17/2011  . Constipation 12/17/2011  . Family history of colon cancer 08/22/2011  . LLQ pain  08/21/2011  . Chronic pancreatitis ([place]) 09/12/2008  . HELICOBACTER PYLORI INFECTION 01/26/2008  . ADENOCARCINOMA, BREAST 01/26/2008  . Essential hypertension 01/26/2008  . External hemorrhoids 01/26/2008  . GERD 01/26/2008  . PSEUDOCYST, PANCREAS 01/26/2008  . GALLSTONE PANCREATITIS 01/26/2008  . GI BLEEDING 01/26/2008  . UTI (urinary tract infection) 01/26/2008  . NAUSEA 01/26/2008  . VOMITING 01/26/2008  . DIARRHEA 01/26/2008    Immunization History  Administered Date(s) Administered  . Fluad Quad(high Dose 65+) 01/06/2020  . Influenza, High Dose Seasonal PF 03/12/2018  . Influenza,inj,Quad PF,6+ Mos 02/04/2013, 01/13/2014, 12/18/2014, 11/22/2015, 11/14/2016  . Moderna SARS-COV2 Booster Vaccination 03/20/2020  . Pneumococcal Conjugate-13 02/04/2013  . Pneumococcal Polysaccharide-23 07/28/2014  . Zoster 08/03/2012    Conditions to be addressed/monitored:  HTN, Type II DM, HF, GERD, Dyslipidemia  There are no care plans that you recently modified to display for this patient.    Medication Assistance: {MEDASSISTANCEINFO:25044}  Patient's preferred pharmacy is:  [place], [place] - [address] [place] [address] Phone: [phone] Fax: [phone]  [facility] [place], [place] [address] [place] [address] Phone: [phone] Fax: [place], [place] [address] Phone: [phone] Fax: [phone]  Uses pill box? {Yes or If no, why not?:20788} Pt endorses ***% compliance  We discussed: {Pharmacy options:24294} Patient decided to: {US Pharmacy Plan:23885}  Care Plan and Follow Up Patient Decision:  {FOLLOWUP:24991}  Plan: {CM FOLLOW UP QBVQ:94503}  ***  Current Barriers:  . {pharmacybarriers:24917}  Pharmacist Clinical Goal(s):  Marland Kitchen Patient will {PHARMACYGOALCHOICES:24921}  through collaboration with PharmD and provider.   Interventions: . 1:1 collaboration with Susy Frizzle, MD regarding development and update of comprehensive plan of care as evidenced by provider attestation and co-signature . Inter-disciplinary care team collaboration (see longitudinal plan of care) . Comprehensive medication review performed; medication list updated in electronic medical record  Hypertension (BP goal {CHL HP UPSTREAM Pharmacist BP [phone]}) -{US controlled/uncontrolled:25276} -Current treatment: . Amlodipine 31m daily . Metoprolol tartrate 218mBID -Medications previously tried: ***  -Current home readings: *** -Current dietary habits: *** -Current exercise habits: *** -{ACTIONS;DENIES/REPORTS:21021675::"Denies"} hypotensive/hypertensive symptoms -Educated on {CCM BP Counseling:25124} -Counseled to monitor BP at home ***, document, and provide log at future appointments -{CCMPHARMDINTERVENTION:25122}  Hyperlipidemia: (LDL goal < ***) -{US controlled/uncontrolled:25276} -Current treatment: . Rosuvastatin 83m53mMedications previously tried: ***  -Current dietary patterns: *** -Current exercise habits: *** -Educated on {CCM HLD Counseling:25126} -{CCMPHARMDINTERVENTION:25122}  Diabetes (A1c goal {A1c goals:23924}) -{US controlled/uncontrolled:25276} -Current medications: . Novolin 70/30 25 units qam and 15 qpm -Medications previously tried: ***  -Current home glucose readings . fasting glucose: *** . post prandial glucose: *** -{ACTIONS;DENIES/REPORTS:21021675::"Denies"} hypoglycemic/hyperglycemic symptoms -Current meal patterns:  . breakfast: ***  . lunch: ***  . dinner: *** . snacks: *** . drinks: *** -Current exercise: *** -Educated on {CCM DM COUNSELING:25123} -Counseled to check feet daily and get yearly eye exams -{CCMPHARMDINTERVENTION:25122}  Heart Failure (Goal: manage symptoms and prevent exacerbations) -{US  controlled/uncontrolled:25276} -Last ejection fraction: *** (Date: ***) -HF type: {type of heart failure:30421350} -NYHA Class: {CHL HP Upstream Pharm NYHA Class:[phone]} -AHA HF Stage: {CHL HP Upstream Pharm AHA HF [phone]} -Current treatment: . Furosemide 67m28mily -Medications previously tried: ***  -Current home BP/HR readings: *** -Current dietary habits: *** -Current exercise habits: *** -Educated on {CCM HF Counseling:25125} -{CCMPHARMDINTERVENTION:25122}  GERD (Goal: ***) -{US controlled/uncontrolled:25276} -Current treatment  . Pantoprazole 40mg28mdications previously tried: ***  -{CCMPHARMDINTERVENTION:25122}   Patient Goals/Self-Care Activities . Patient will:  - {pharmacypatientgoals:24919}  Follow Up Plan: {CM FOLLOW UP PLAN:PPHK:32761}

## 2020-07-30 ENCOUNTER — Ambulatory Visit: Payer: Medicare HMO

## 2020-08-03 ENCOUNTER — Other Ambulatory Visit: Payer: Self-pay | Admitting: Family Medicine

## 2020-08-14 ENCOUNTER — Telehealth: Payer: Self-pay | Admitting: Pharmacist

## 2020-08-14 ENCOUNTER — Other Ambulatory Visit: Payer: Self-pay | Admitting: Family Medicine

## 2020-08-14 NOTE — Progress Notes (Addendum)
    Chronic Care Management Pharmacy Assistant   Name: Misty Edwards  MRN: [id number] DOB: Jul 21, 1926  Reason for Encounter: Adherence Review  Medications: Outpatient Encounter Medications as of 08/14/2020  Medication Sig   amLODipine (NORVASC) 5 MG tablet Take 1 tablet (5 mg total) by mouth daily.   aspirin EC 81 MG EC tablet Take 1 tablet (81 mg total) by mouth daily.   brimonidine-timolol (COMBIGAN) 0.2-0.5 % ophthalmic solution Place 1 drop into both eyes every 12 (twelve) hours as needed (dry eyes).    calcium carbonate (TUMS - DOSED IN MG ELEMENTAL CALCIUM) 500 MG chewable tablet Chew 3 tablets by mouth at bedtime.   cetirizine (ZYRTEC) 10 MG tablet Take 1 tablet by mouth once daily   CREON 24000-76000 units CPEP TAKE 2 CAPSULES BY MOUTH WITH MEALS AND 1 CAP WITH SNACKS x 2   cycloSPORINE (RESTASIS) 0.05 % ophthalmic emulsion Place 1 drop into both eyes 2 (two) times daily.   ferrous sulfate 325 (65 FE) MG tablet Take 325 mg by mouth daily with breakfast.   FLUoxetine (PROZAC) 20 MG tablet Take 1 tablet by mouth once daily   fluticasone (FLONASE) 50 MCG/ACT nasal spray Place 2 sprays into both nostrils daily.   furosemide (LASIX) 20 MG tablet Take 1 tablet (20 mg total) by mouth daily.   gabapentin (NEURONTIN) 100 MG capsule TAKE 1 CAPSULE BY MOUTH THREE TIMES DAILY AS NEEDED FOR  NERVE  PAIN   glucose blood (ONE TOUCH ULTRA TEST) test strip Use as directed to monitor FSBS 3 x daily. Dx: E11.9.   insulin NPH-regular Human (NOVOLIN 70/30 RELION) (70-30) 100 UNIT/ML injection INJECT 25 UNITS SUBCUTANEOUSLY IN THE MORNING AND 15 IN THE EVENING   Insulin Syringe-Needle U-100 (BD INSULIN SYRINGE U/F) 31G X 5/16" 0.5 ML MISC USE AS DIRECTED TO INJECT INSULIN SQ 2X DAILY.   Lancets (ONETOUCH DELICA PLUS [id number]) MISC Use as directed.   Lancets Misc. MISC Pt has Accu Chek Aviva Plus - Needs just lancets  Checks BS 4-5 times per day. Disp 3 boxes(90 day Supply)/4 refills Dx code. E11.9    LORazepam (ATIVAN) 1 MG tablet TAKE 1/2 TO 1 (ONE-HALF TO ONE) TABLET BY MOUTH TWICE DAILY AS NEEDED   magnesium oxide (MAG-OX) 400 (241.3 Mg) MG tablet Take 1 tablet (400 mg total) by mouth daily.   metoprolol tartrate (LOPRESSOR) 25 MG tablet Take 1 tablet (25 mg total) by mouth 2 (two) times daily.   Multiple Vitamin (MULTIVITAMIN) capsule Take 1 capsule by mouth every morning.    pantoprazole (PROTONIX) 40 MG tablet Take 1 tablet by mouth once daily   polyethylene glycol (MIRALAX / GLYCOLAX) 17 g packet Take 17 g by mouth daily.   potassium chloride (KLOR-CON) 10 MEQ tablet TAKE 1 TABLET BY MOUTH ONCE DAILY AS NEEDED -  TAKE  WITH  FUROSEMIDE   rosuvastatin (CRESTOR) 5 MG tablet Take 1 tablet by mouth once daily   triamcinolone (KENALOG) 0.1 % APPLY CREAM EXTERNALLY TWICE DAILY   Wheat Dextrin (BENEFIBER DRINK MIX) PACK Take 1 Package by mouth daily as needed (for supplement-digestion).    No facility-administered encounter medications on file as of 08/14/2020.   Reviewed the patients chart for any medical/health and/or medication changes there were not any changes at this time.    Follow-Up:Pharmacist Review  Charlann Lange, [place] Pharmacist Assistant [phone]

## 2020-08-21 ENCOUNTER — Other Ambulatory Visit: Payer: Self-pay | Admitting: Family Medicine

## 2020-08-21 ENCOUNTER — Other Ambulatory Visit: Payer: Self-pay | Admitting: Cardiology

## 2020-08-21 DIAGNOSIS — F32A Depression, unspecified: Secondary | ICD-10-CM

## 2020-08-21 DIAGNOSIS — Z951 Presence of aortocoronary bypass graft: Secondary | ICD-10-CM

## 2020-08-28 ENCOUNTER — Telehealth: Payer: Self-pay | Admitting: Family Medicine

## 2020-08-28 NOTE — Telephone Encounter (Signed)
No answer unable to leave a message for patient to call back and schedule Medicare Annual Wellness Visit (AWV) in office.   If not able to come in office, please offer to do virtually or by telephone.   Last AWV: 01/15/2015   Please schedule at anytime with [facility]-Nurse Health Advisor.  If any questions, please contact me at [phone]

## 2020-09-03 ENCOUNTER — Other Ambulatory Visit: Payer: Self-pay | Admitting: Family Medicine

## 2020-09-10 ENCOUNTER — Other Ambulatory Visit: Payer: Self-pay | Admitting: Family Medicine

## 2020-09-10 NOTE — Telephone Encounter (Signed)
Ok to refill??  Last office visit 01/06/2020.  Last refill 08/14/2020.

## 2020-09-11 NOTE — Telephone Encounter (Signed)
PDMP reviewed.  It appears patient takes this medication chronically.  Refill given in place of PCP who is out of the office.

## 2020-10-07 ENCOUNTER — Other Ambulatory Visit: Payer: Self-pay | Admitting: Family Medicine

## 2020-10-07 ENCOUNTER — Other Ambulatory Visit: Payer: Self-pay | Admitting: Nurse Practitioner

## 2020-10-17 ENCOUNTER — Other Ambulatory Visit: Payer: Self-pay | Admitting: Cardiology

## 2020-10-20 ENCOUNTER — Other Ambulatory Visit: Payer: Self-pay | Admitting: Family Medicine

## 2020-11-01 ENCOUNTER — Other Ambulatory Visit: Payer: Self-pay | Admitting: Family Medicine

## 2020-11-01 NOTE — Telephone Encounter (Signed)
Ok to refill??  Last office visit 01/06/2020.  Last refill 10/08/2020.

## 2020-11-12 ENCOUNTER — Telehealth: Payer: Self-pay

## 2020-11-12 ENCOUNTER — Ambulatory Visit (INDEPENDENT_AMBULATORY_CARE_PROVIDER_SITE_OTHER): Payer: Medicare HMO | Admitting: Family Medicine

## 2020-11-12 ENCOUNTER — Encounter: Payer: Self-pay | Admitting: Family Medicine

## 2020-11-12 ENCOUNTER — Other Ambulatory Visit: Payer: Self-pay | Admitting: Family Medicine

## 2020-11-12 ENCOUNTER — Other Ambulatory Visit: Payer: Self-pay

## 2020-11-12 VITALS — BP 138/64 | HR 68 | Temp 98.1°F | Resp 14 | Ht 63.0 in | Wt 149.0 lb

## 2020-11-12 DIAGNOSIS — E1169 Type 2 diabetes mellitus with other specified complication: Secondary | ICD-10-CM | POA: Diagnosis not present

## 2020-11-12 DIAGNOSIS — Z794 Long term (current) use of insulin: Secondary | ICD-10-CM | POA: Diagnosis not present

## 2020-11-12 MED ORDER — METOPROLOL TARTRATE 25 MG PO TABS: 25.0000 mg | ORAL_TABLET | Freq: Two times a day (BID) | ORAL | 3 refills | Status: DC

## 2020-11-12 NOTE — Telephone Encounter (Signed)
Medication refill request for Metoprolol Tartrate 25 mg tablets approved and sent to [facility].

## 2020-11-12 NOTE — Progress Notes (Signed)
Subjective:    Patient ID: Misty Edwards, female    DOB: 1927/01/11, 87 y.o.   MRN: [id number]  HPI  Patient is a very sweet 87 year old Caucasian female here today for follow-up of her diabetes.  She is currently on 32 units of 70/30 in the morning and 20 units of 70/30 in the evening.  Her morning blood sugars are typically between 150 and 200.  Her evening blood sugars taken around 5:00 in the afternoon before suppertime are often between 200-300.  She denies any hypoglycemic episodes.  She denies any chest pain shortness of breath or dyspnea on exertion.  She denies any palpitations or syncope.  She is still living alone and independently although she does have a caregiver come by every morning around 7:00.  She still cooks her breakfast and washes her own dishes.  Mentally she is extremely sharp.  She still ambulates using a walker.  She is not falling.  There is no evidence of any memory issues.  Her grandson prepares her medications. Past Medical History:  Diagnosis Date   Acute biliary pancreatitis 07/2002   thia was in 07/2004:she still has pseudocyst in tail of pancreas measuring 54 x 33 mm    Adrenal adenoma    bilateral   Anxiety    CAD (coronary artery disease)    a. s/p CABG in 08/2014 with LIMA-LAD, SVG-OM and SVG-PDA   Chronic pancreatitis ([place])    Detached retina    Diabetes mellitus ([place])    Diverticulosis    History of carcinoma in situ of breast 1988   Hyperlipidemia    Hypertension    IBS (irritable bowel syndrome)    Legally blind    Osteoarthritis    Osteopenia    Upper GI bleed September2004   secondary to gastritis   Past Surgical History:  Procedure Laterality Date   CARDIAC CATHETERIZATION N/A 08/24/2014   Procedure: Left Heart Cath and Coronary Angiography;  Surgeon: Leonie Man, MD;  Location: [facility];  Service: Cardiovascular;  Laterality: N/A;   COLONOSCOPY  08/15/05   few tiny diverticula at sigmoid colon/external hemorrhoids but no  polyps   COLONOSCOPY  01/08/2012   [id number] diverticulosis. Next colonoscopy in 12/2016   CORONARY ARTERY BYPASS GRAFT N/A 08/24/2014   Procedure: CORONARY ARTERY BYPASS GRAFTING (CABG) x three, using left internal mammary artery and right leg greater saphenous vein harvested endoscopically;  Surgeon: Ivin Poot, MD;  Location: [facility];  Service: Open Heart Surgery;  Laterality: N/A;   ECTOPIC PREGNANCY SURGERY  1950's   ERCP with sphincterotomy  07/2002   ESOPHAGOGASTRODUODENOSCOPY      Gastritis of body.  Otherwise normal   ESOPHAGOGASTRODUODENOSCOPY  01/08/2012   RMR: Few scattered gastric erosions of uncertain significance-status post biopsy. Minimal chronic inflammation, no H.pylori   LAPAROSCOPIC CHOLECYSTECTOMY     MASTECTOMY Right 1980   PANCREATIC PSEUDOCYST DRAINAGE  [id number]   drained percutaneously    right mastectomy     tacking up of her bladder     TEE WITHOUT CARDIOVERSION N/A 08/24/2014   Procedure: TRANSESOPHAGEAL ECHOCARDIOGRAM (TEE);  Surgeon: Ivin Poot, MD;  Location: [facility];  Service: Open Heart Surgery;  Laterality: N/A;   Current Outpatient Medications on File Prior to Visit  Medication Sig Dispense Refill   amLODipine (NORVASC) 5 MG tablet Take 1 tablet by mouth once daily 90 tablet 2   aspirin EC 81 MG EC tablet Take 1 tablet (81 mg total) by mouth daily.  calcium carbonate (TUMS - DOSED IN MG ELEMENTAL CALCIUM) 500 MG chewable tablet Chew 3 tablets by mouth at bedtime.     cetirizine (ZYRTEC) 10 MG tablet Take 1 tablet by mouth once daily 90 tablet 0   CREON 24000-76000 units CPEP TAKE 2 CAPSULES BY MOUTH WITH MEALS AND 1 CAP WITH SNACKS x 2 300 capsule 11   cycloSPORINE (RESTASIS) 0.05 % ophthalmic emulsion Place 1 drop into both eyes 2 (two) times daily. 3 mL 6   ferrous sulfate 325 (65 FE) MG tablet Take 325 mg by mouth daily with breakfast.     FLUoxetine (PROZAC) 20 MG tablet Take 1 tablet by mouth once daily 90 tablet 0   furosemide (LASIX) 20  MG tablet Take 1 tablet by mouth once daily 90 tablet 0   gabapentin (NEURONTIN) 100 MG capsule TAKE 1 CAPSULE BY MOUTH THREE TIMES DAILY AS NEEDED FOR NERVE PAIN 90 capsule 0   glucose blood (ONE TOUCH ULTRA TEST) test strip Use as directed to monitor FSBS 3 x daily. Dx: E11.9. 100 each 3   insulin NPH-regular Human (NOVOLIN 70/30 RELION) (70-30) 100 UNIT/ML injection INJECT 25 UNITS SUBCUTANEOUSLY IN THE MORNING AND 15 IN THE EVENING (Patient taking differently: INJECT 32 UNITS SUBCUTANEOUSLY IN THE MORNING AND 20 IN THE EVENING) 10 mL 2   Insulin Syringe-Needle U-100 (BD INSULIN SYRINGE U/F) 31G X 5/16" 0.5 ML MISC USE AS DIRECTED TO INJECT INSULIN SQ 2X DAILY. 500 each 0   Lancets Misc. MISC Pt has Accu Chek Aviva Plus - Needs just lancets  Checks BS 4-5 times per day. Disp 3 boxes(90 day Supply)/4 refills Dx code. E11.9 450 each 3   LORazepam (ATIVAN) 1 MG tablet TAKE 1/2 TO 1 (ONE-HALF TO ONE) TABLET BY MOUTH TWICE DAILY AS NEEDED 30 tablet 0   magnesium oxide (MAG-OX) 400 (240 Mg) MG tablet Take 1 tablet by mouth daily.     Multiple Vitamin (MULTIVITAMIN) capsule Take 1 capsule by mouth every morning.      OneTouch Delica Lancets 99991111 MISC CHECK BLOOD SUGAR 4-5 TIMES PER DAY 300 each 0   pantoprazole (PROTONIX) 40 MG tablet Take 1 tablet by mouth once daily 90 tablet 3   potassium chloride (KLOR-CON) 10 MEQ tablet TAKE 1 TABLET BY MOUTH ONCE DAILY AS NEEDED (TAKE  WITH  FUROSEMIDE) 90 tablet 0   rosuvastatin (CRESTOR) 5 MG tablet Take 1 tablet by mouth once daily 90 tablet 3   triamcinolone (KENALOG) 0.1 % APPLY CREAM EXTERNALLY TWICE DAILY 60 g 0   Wheat Dextrin (BENEFIBER DRINK MIX) PACK Take 1 Package by mouth daily as needed (for supplement-digestion).      No current facility-administered medications on file prior to visit.   Allergies  Allergen Reactions   Calcium-Containing Compounds Nausea Only   Morphine And Related Other (See Comments)    Hallucination   Raloxifene Itching     Evista- Face and eyes burning   Vitamin D Analogs Nausea Only   Social History   Socioeconomic History   Marital status: Widowed    Spouse name: Not on file   Number of children: 5   Years of education: Not on file   Highest education level: Not on file  Occupational History   Occupation: homemaker  Tobacco Use   Smoking status: Never   Smokeless tobacco: Never  Vaping Use   Vaping Use: Never used  Substance and Sexual Activity   Alcohol use: No    Alcohol/week: 0.0 standard drinks  Drug use: No   Sexual activity: Not Currently    Birth control/protection: Post-menopausal  Other Topics Concern   Not on file  Social History Narrative   Lives in [place].   Social Determinants of Health   Financial Resource Strain: Not on file  Food Insecurity: Not on file  Transportation Needs: Not on file  Physical Activity: Not on file  Stress: Not on file  Social Connections: Not on file  Intimate Partner Violence: Not on file          Review of Systems  All other systems reviewed and are negative.     Objective:   Physical Exam Constitutional:      Appearance: Normal appearance.  Cardiovascular:     Rate and Rhythm: Normal rate and regular rhythm.     Heart sounds: Normal heart sounds. No murmur heard. Pulmonary:     Effort: Pulmonary effort is normal. No respiratory distress.     Breath sounds: Normal breath sounds. No stridor. No wheezing, rhonchi or rales.  Chest:     Chest wall: No tenderness.  Abdominal:     General: Abdomen is flat. Bowel sounds are normal. There is no distension.     Palpations: Abdomen is soft. There is no mass.     Tenderness: There is no abdominal tenderness.  Musculoskeletal:        General: Tenderness present.     Right lower leg: No edema.     Left lower leg: No edema.  Skin:    Findings: No erythema or rash.  Neurological:     General: No focal deficit present.     Mental Status: She is alert and oriented to person, place,  and time. Mental status is at baseline.     Cranial Nerves: No cranial nerve deficit.     Sensory: No sensory deficit.     Motor: Weakness present.     Coordination: Coordination normal.          Assessment & Plan:  Type 2 diabetes mellitus with other specified complication, with long-term current use of insulin ([place]) - Plan: CBC with Differential/Platelet, COMPLETE METABOLIC PANEL WITH GFR, Hemoglobin A1c Patient appears euvolemic today at 149 pounds.  Her blood pressure is excellent at 138/64.  She is in normal sinus rhythm and her lungs are clear there is no pitting edema on her's exam.  Seems like her morning insulin dose is insufficient as later in the day after lunch and before supper she is running too high in the 200-300 range.  Therefore I recommended increasing her morning insulin to 36 units.  I recommended that we increase her insulin by 3 units in the morning weekly until her afternoon blood sugars are under 200.  She will call me every week and give me an update on her sugars.  We will leave the 20 units in the afternoon alone as this seems to be working well through the night keeping her around 150 in the morning.  I anticipate that she will need to be somewhere between 45 and 50 units in the morning and 20 units in the afternoon but we will go up slowly and cautiously to avoid hypoglycemic episodes

## 2020-11-13 LAB — CBC WITH DIFFERENTIAL/PLATELET
Absolute Monocytes: 400 cells/uL (ref 200–950)
Basophils Absolute: 32 cells/uL (ref 0–200)
Basophils Relative: 0.6 %
Eosinophils Absolute: 92 cells/uL (ref 15–500)
Eosinophils Relative: 1.7 %
HCT: 39.3 % (ref 35.0–45.0)
Hemoglobin: 12.5 g/dL (ref 11.7–15.5)
Lymphs Abs: 1550 cells/uL (ref 850–3900)
MCH: 28.3 pg (ref 27.0–33.0)
MCHC: 31.8 g/dL — ABNORMAL LOW (ref 32.0–36.0)
MCV: 88.9 fL (ref 80.0–100.0)
MPV: 11.5 fL (ref 7.5–12.5)
Monocytes Relative: 7.4 %
Neutro Abs: 3326 cells/uL (ref 1500–7800)
Neutrophils Relative %: 61.6 %
Platelets: 142 10*3/uL (ref 140–400)
RBC: 4.42 10*6/uL (ref 3.80–5.10)
RDW: 13.1 % (ref 11.0–15.0)
Total Lymphocyte: 28.7 %
WBC: 5.4 10*3/uL (ref 3.8–10.8)

## 2020-11-13 LAB — COMPLETE METABOLIC PANEL WITH GFR
AG Ratio: 1.7 (calc) (ref 1.0–2.5)
ALT: 22 U/L (ref 6–29)
AST: 31 U/L (ref 10–35)
Albumin: 4.5 g/dL (ref 3.6–5.1)
Alkaline phosphatase (APISO): 43 U/L (ref 37–153)
BUN/Creatinine Ratio: 21 (calc) (ref 6–22)
BUN: 24 mg/dL (ref 7–25)
CO2: 26 mmol/L (ref 20–32)
Calcium: 9.5 mg/dL (ref 8.6–10.4)
Chloride: 97 mmol/L — ABNORMAL LOW (ref 98–110)
Creat: 1.12 mg/dL — ABNORMAL HIGH (ref 0.60–0.95)
Globulin: 2.7 g/dL (calc) (ref 1.9–3.7)
Glucose, Bld: 143 mg/dL — ABNORMAL HIGH (ref 65–99)
Potassium: 4.2 mmol/L (ref 3.5–5.3)
Sodium: 134 mmol/L — ABNORMAL LOW (ref 135–146)
Total Bilirubin: 0.7 mg/dL (ref 0.2–1.2)
Total Protein: 7.2 g/dL (ref 6.1–8.1)
eGFR: 46 mL/min/{1.73_m2} — ABNORMAL LOW (ref >=60)

## 2020-11-13 LAB — HEMOGLOBIN A1C
Hgb A1c MFr Bld: 6.5 % of total Hgb — ABNORMAL HIGH (ref <5.7)
Mean Plasma Glucose: 140 mg/dL
eAG (mmol/L): 7.7 mmol/L

## 2020-11-16 ENCOUNTER — Other Ambulatory Visit: Payer: Self-pay | Admitting: *Deleted

## 2020-11-16 MED ORDER — BLOOD GLUCOSE SYSTEM PAK KIT: PACK | 1 refills | Status: AC

## 2020-11-19 ENCOUNTER — Other Ambulatory Visit: Payer: Self-pay | Admitting: Family Medicine

## 2020-11-19 DIAGNOSIS — F32A Depression, unspecified: Secondary | ICD-10-CM

## 2020-12-03 ENCOUNTER — Other Ambulatory Visit: Payer: Self-pay

## 2020-12-03 ENCOUNTER — Ambulatory Visit: Payer: Medicare HMO

## 2020-12-03 ENCOUNTER — Ambulatory Visit (INDEPENDENT_AMBULATORY_CARE_PROVIDER_SITE_OTHER): Payer: Medicare HMO | Admitting: *Deleted

## 2020-12-03 ENCOUNTER — Other Ambulatory Visit: Payer: Self-pay | Admitting: Family Medicine

## 2020-12-03 DIAGNOSIS — Z23 Encounter for immunization: Secondary | ICD-10-CM

## 2020-12-04 NOTE — Telephone Encounter (Signed)
Last date filled 11/01/20 Last ov 11/12/20

## 2020-12-13 IMAGING — CT CT HEAD WITHOUT CONTRAST
5 of 7 series · 17 of 47 positions shown, 18 images · non-contrast
Comparison: None.

CLINICAL DATA: Minor head trauma.  Fall going to bathroom.

EXAM:
CT HEAD WITHOUT CONTRAST
CT CERVICAL SPINE WITHOUT CONTRAST
TECHNIQUE: Multidetector CT imaging of the head and cervical spine was
performed following the standard protocol without intravenous
contrast. Multiplanar CT image reconstructions of the cervical spine
were also generated.

[Series 3: head w o · axial · 0.43mm/px · z∈[+53,+123]mm · 3 of 30 slices shown, 4 images]
[im 8/30  brain]
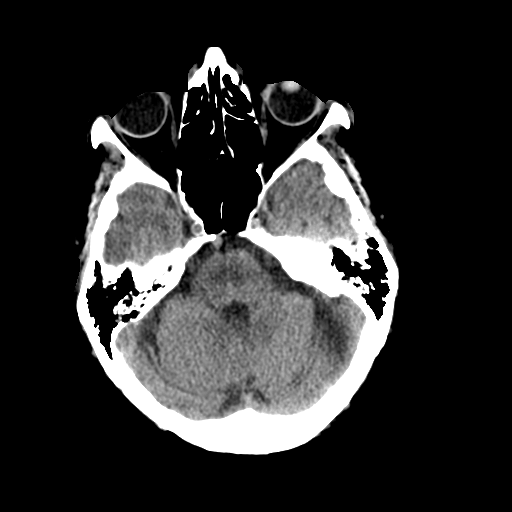
[im 8/30  bone]
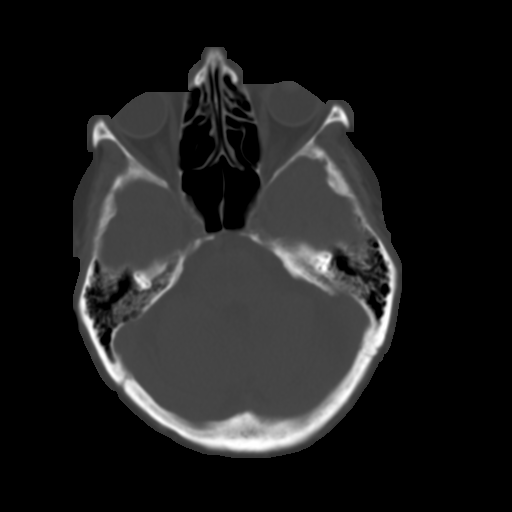
[im 15/30  brain]
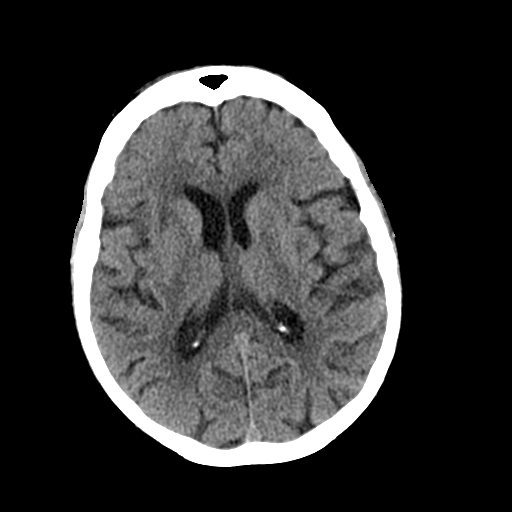
[im 22/30  brain]
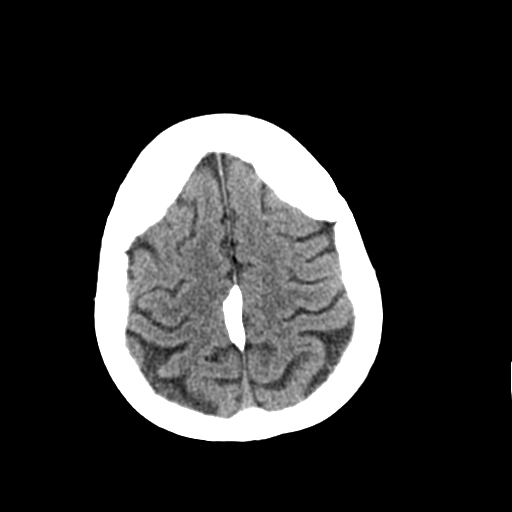

[Series 4: head bone · axial · 0.43mm/px · z∈[+30,+152]mm · 8 of 75 slices shown]
[im 7/75  bone]
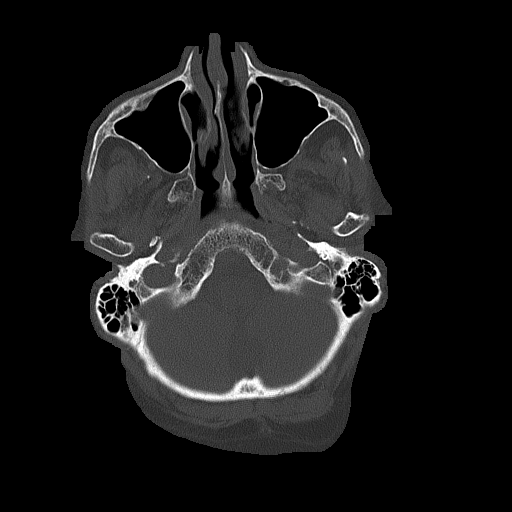
[im 14/75  bone]
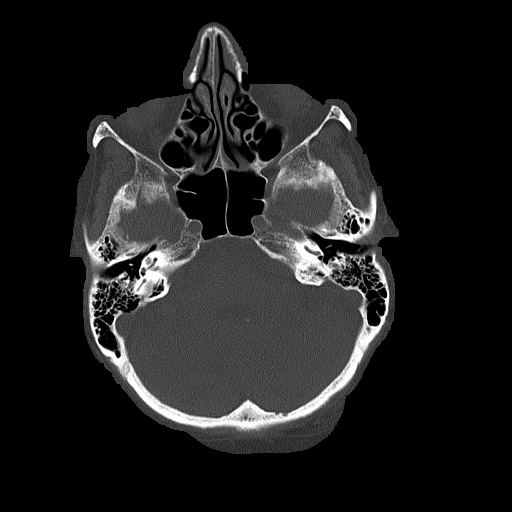
[im 27/75  bone]
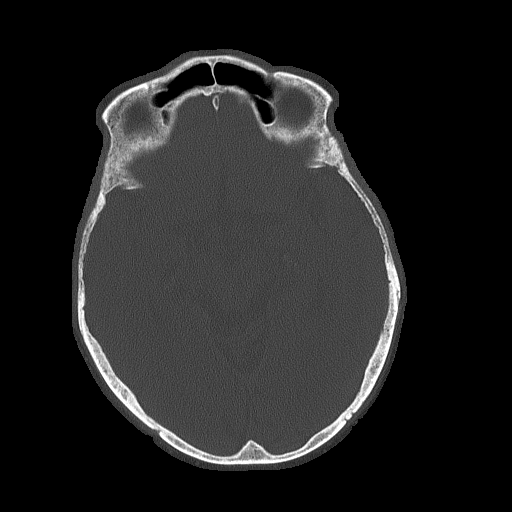
[im 34/75  bone]
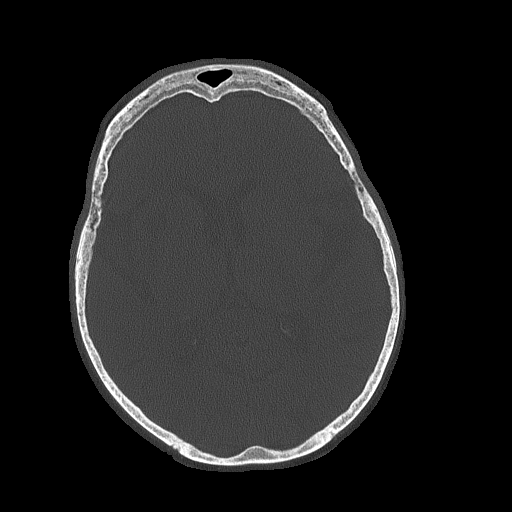
[im 41/75  bone]
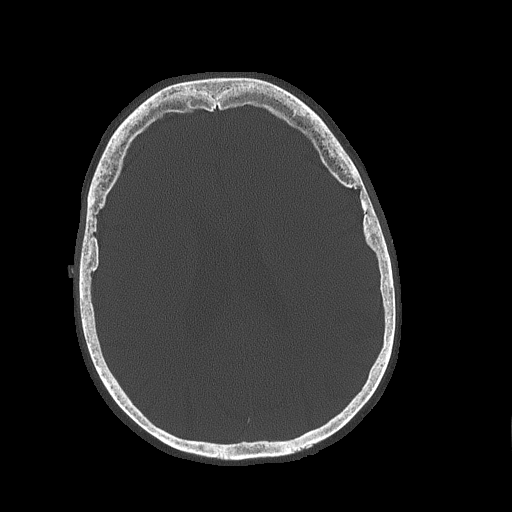
[im 48/75  bone]
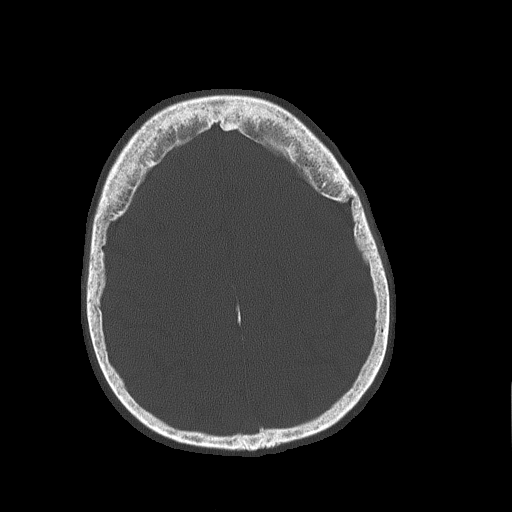
[im 61/75  bone]
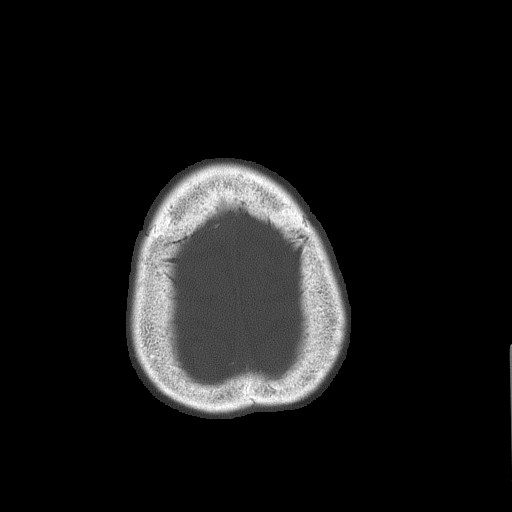
[im 68/75  bone]
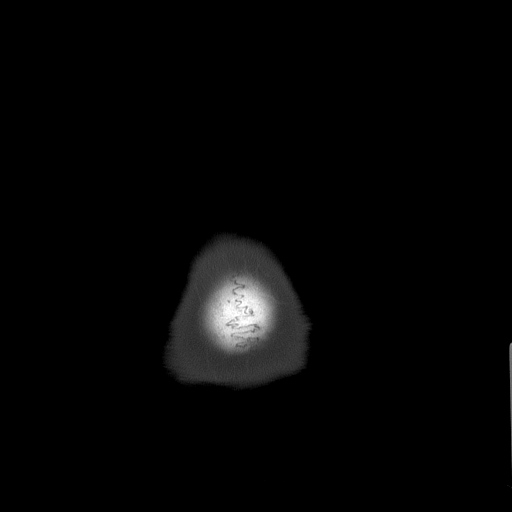

[Series 5: coronal soft · coronal · 0.29mm/px · 3 of 67 slices shown]
[im 25/67  brain]
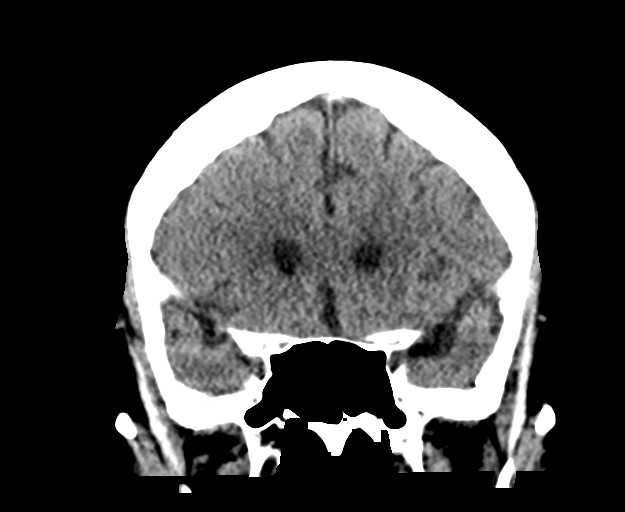
[im 34/67  brain]
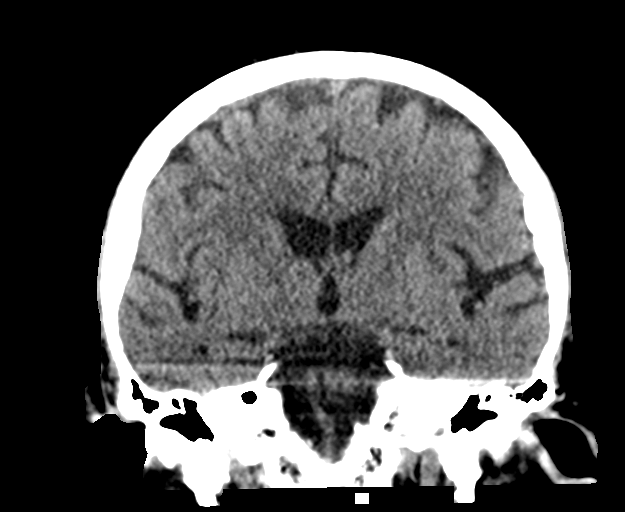
[im 42/67  brain]
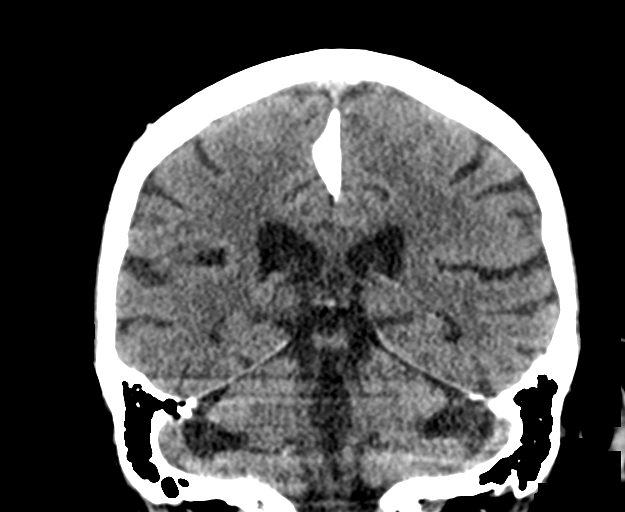

[Series 6: sagittal soft · sagittal · 0.29mm/px · 1 of 51 slices shown]
[im 26/51  brain]
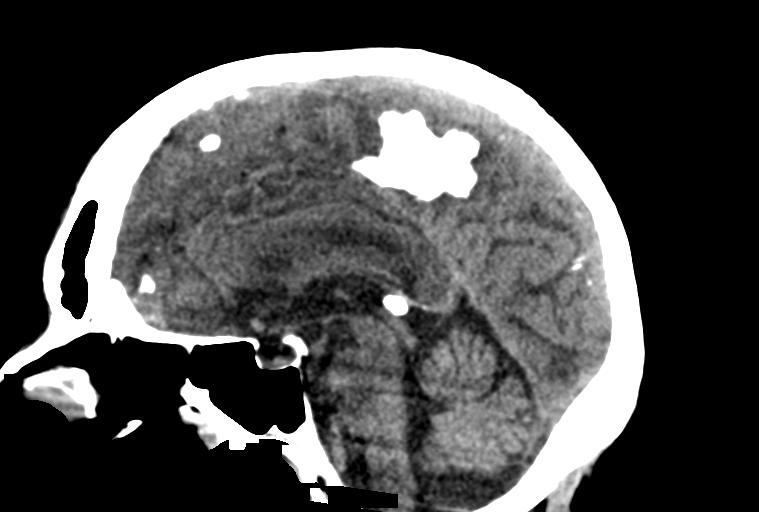

[Series 8: c spine soft · axial · 0.33mm/px · z∈[-112,-86]mm · 2 of 79 slices shown]
[im 7/79  brain]
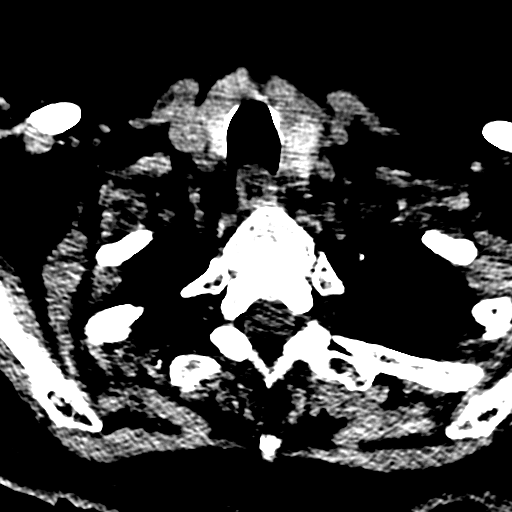
[im 20/79  brain]
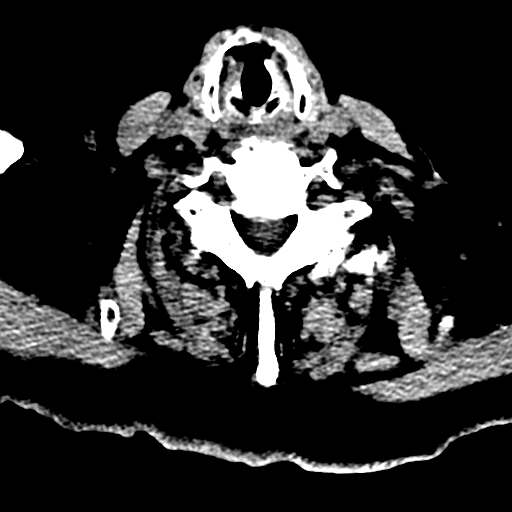

[17 of 47 positions shown; findings below may reference images not displayed]

FINDINGS: CT HEAD FINDINGS

Brain: No evidence of acute infarction, hemorrhage, hydrocephalus,
extra-axial collection or mass lesion/mass effect. Perforator
infarct at the right basal ganglia, age indeterminate by imaging but
chronic based on the history. Age congruent cerebral volume loss.

Vascular: Atherosclerotic calcification

Skull: Negative for fracture

Sinuses/Orbits: No evidence of injury.  Right cataract resection.

CT CERVICAL SPINE FINDINGS

Alignment: No traumatic malalignment

Skull base and vertebrae: Negative for acute fracture.
Atlantooccipital non segmentation.

Soft tissues and spinal canal: No prevertebral fluid or swelling. No
visible canal hematoma. Multinodular thyroid that is partially
covered. No invasive features.

Disc levels: Generalized degenerative facet spurring and disc
narrowing. C4-5 ACDF with solid arthrodesis

Upper chest: Negative
IMPRESSION: No evidence of acute intracranial or cervical spine injury.

## 2020-12-23 ENCOUNTER — Other Ambulatory Visit: Payer: Self-pay | Admitting: Family Medicine

## 2020-12-24 ENCOUNTER — Telehealth: Payer: Self-pay | Admitting: Internal Medicine

## 2020-12-24 NOTE — Telephone Encounter (Signed)
Patient assistance for pt's Creon was approved  through 03/16/2022. Approval letter will be scanned into patient's chart.

## 2020-12-27 ENCOUNTER — Telehealth: Payer: Self-pay | Admitting: *Deleted

## 2020-12-27 NOTE — Telephone Encounter (Signed)
Received written FSBS results from patient.   PCP reviewed and states that readings are acceptable.   No changes to be made.   Call placed to patient daughter [name]. [place].

## 2020-12-28 NOTE — Telephone Encounter (Signed)
Call placed to patient and patient daughter Misty Edwards made aware.

## 2021-01-03 ENCOUNTER — Other Ambulatory Visit: Payer: Self-pay | Admitting: Family Medicine

## 2021-01-03 NOTE — Telephone Encounter (Signed)
Ok to refill??  Last office visit 11/12/2020.  Last refill 12/04/2020.

## 2021-01-22 ENCOUNTER — Other Ambulatory Visit: Payer: Self-pay | Admitting: Family Medicine

## 2021-01-29 ENCOUNTER — Other Ambulatory Visit: Payer: Self-pay | Admitting: Family Medicine

## 2021-01-29 NOTE — Telephone Encounter (Signed)
Ok to refill??  Last office visit 11/12/2020.  Last refill 01/04/2021.

## 2021-02-04 ENCOUNTER — Other Ambulatory Visit: Payer: Self-pay | Admitting: Family Medicine

## 2021-02-06 ENCOUNTER — Other Ambulatory Visit: Payer: Self-pay

## 2021-02-06 DIAGNOSIS — E1169 Type 2 diabetes mellitus with other specified complication: Secondary | ICD-10-CM

## 2021-02-06 MED ORDER — "INSULIN SYRINGE-NEEDLE U-100 31G X 5/16"" 0.5 ML MISC": 5 refills | Status: DC

## 2021-02-17 ENCOUNTER — Other Ambulatory Visit: Payer: Self-pay | Admitting: Family Medicine

## 2021-02-17 DIAGNOSIS — F32A Depression, unspecified: Secondary | ICD-10-CM

## 2021-03-02 ENCOUNTER — Other Ambulatory Visit: Payer: Self-pay | Admitting: Family Medicine

## 2021-03-07 ENCOUNTER — Ambulatory Visit: Payer: Medicare HMO

## 2021-03-27 ENCOUNTER — Telehealth: Payer: Self-pay | Admitting: Family Medicine

## 2021-03-27 NOTE — Telephone Encounter (Signed)
Left message for patient's daughter, [name],   to call back and schedule Medicare Annual Wellness Visit (AWV) in office.   If not able to come in office, please offer to do virtually or by telephone.  Left office number and my jabber [phone].  Due for AWVI  Please schedule at anytime with Nurse Health Advisor.

## 2021-03-28 ENCOUNTER — Other Ambulatory Visit: Payer: Self-pay | Admitting: Family Medicine

## 2021-03-28 NOTE — Telephone Encounter (Signed)
Lorazepam refill request.  Last seen 11/12/2020, last filled 03/04/2021.

## 2021-03-29 ENCOUNTER — Telehealth: Payer: Self-pay | Admitting: Family Medicine

## 2021-03-29 NOTE — Telephone Encounter (Signed)
Left message for patient's daughter, [name],  to call back and schedule Medicare Annual Wellness Visit (AWV) in office.   If not able to come in office, please offer to do virtually or by telephone.  Left office number and my jabber [phone].  DUE FOR AWVS: 01/13/2014  Please schedule at anytime with Nurse Health Advisor.

## 2021-04-08 ENCOUNTER — Other Ambulatory Visit: Payer: Self-pay | Admitting: Family Medicine

## 2021-04-15 ENCOUNTER — Telehealth: Payer: Self-pay | Admitting: Family Medicine

## 2021-04-15 NOTE — Telephone Encounter (Signed)
Left message for patient's daughter, [name], to call back and schedule Medicare Annual Wellness Visit (AWV) in office.   If not able to come in office, please offer to do virtually or by telephone.  Left office number and my jabber [phone].  Due for AWVI  Please schedule at anytime with Nurse Health Advisor.

## 2021-04-18 ENCOUNTER — Other Ambulatory Visit: Payer: Self-pay | Admitting: Cardiology

## 2021-04-18 ENCOUNTER — Other Ambulatory Visit: Payer: Self-pay | Admitting: Family Medicine

## 2021-05-03 ENCOUNTER — Other Ambulatory Visit: Payer: Self-pay | Admitting: Family Medicine

## 2021-05-03 NOTE — Telephone Encounter (Signed)
LOV 11/12/20 Last refill 04/08/21, #30,0 refills  Please review, thanks!

## 2021-05-06 ENCOUNTER — Telehealth: Payer: Self-pay | Admitting: Pharmacist

## 2021-05-06 NOTE — Progress Notes (Addendum)
° ° °  Chronic Care Management Pharmacy Assistant   Name: TYNIKA LUDDY  MRN: [id number] DOB: 04/27/1926  Reason for Encounter: Reschedule missed appointment with CPP  Called and left message for patient to return call to reschedule missed appointment with CPP. Asked patient to return to call reschedule.   Daughter returned call and stated she would call back next week once she got her work scheduled on Saturday for the next 2 weeks and schedule. She requested a telephone visit instead of an in person as she stated this was easier for patient. Will await her return call to reschedule.     Jobe Gibbon, [facility] Clinical Pharmacist Assistant  [phone]

## 2021-05-08 ENCOUNTER — Telehealth: Payer: Self-pay | Admitting: Family Medicine

## 2021-05-08 NOTE — Telephone Encounter (Signed)
Left message for patient's daughter,Misty Edwards, to call back and schedule Medicare Annual Wellness Visit (AWV) in office.   If not able to come in office, please offer to do virtually or by telephone.  Left office number and my jabber [phone].  Last AWV:  01/13/2014  Please schedule at anytime with Nurse Health Advisor.

## 2021-05-18 ENCOUNTER — Other Ambulatory Visit: Payer: Self-pay | Admitting: Cardiology

## 2021-05-18 ENCOUNTER — Other Ambulatory Visit: Payer: Self-pay | Admitting: Family Medicine

## 2021-05-18 DIAGNOSIS — F32A Depression, unspecified: Secondary | ICD-10-CM

## 2021-05-30 ENCOUNTER — Other Ambulatory Visit: Payer: Self-pay | Admitting: Family Medicine

## 2021-05-31 NOTE — Telephone Encounter (Signed)
LOV 11/12/20 ?Last refill 05/03/21, #30, 0 refills ? ?Please review, thanks! ? ?

## 2021-06-05 ENCOUNTER — Telehealth: Payer: Self-pay | Admitting: Family Medicine

## 2021-06-05 NOTE — Telephone Encounter (Signed)
Left message for patient to call back and schedule Medicare Annual Wellness Visit (AWV) in office.  ° °If not able to come in office, please offer to do virtually or by telephone.  Left office number and my jabber #[phone]. ° °Due for AWVI ° °Please schedule at anytime with Nurse Health Advisor. °  °

## 2021-06-20 ENCOUNTER — Ambulatory Visit (INDEPENDENT_AMBULATORY_CARE_PROVIDER_SITE_OTHER): Payer: PPO | Admitting: Family Medicine

## 2021-06-20 VITALS — BP 142/60 | HR 70 | Temp 97.0°F | Ht 63.0 in | Wt 154.2 lb

## 2021-06-20 DIAGNOSIS — N1832 Chronic kidney disease, stage 3b: Secondary | ICD-10-CM

## 2021-06-20 DIAGNOSIS — I10 Essential (primary) hypertension: Secondary | ICD-10-CM

## 2021-06-20 DIAGNOSIS — E1169 Type 2 diabetes mellitus with other specified complication: Secondary | ICD-10-CM

## 2021-06-20 DIAGNOSIS — Z794 Long term (current) use of insulin: Secondary | ICD-10-CM

## 2021-06-20 DIAGNOSIS — I5032 Chronic diastolic (congestive) heart failure: Secondary | ICD-10-CM | POA: Diagnosis not present

## 2021-06-20 NOTE — Progress Notes (Signed)
? ?Subjective:  ? ? Patient ID: Misty Edwards, female    DOB: [birth year], 88 y.o.   MRN: [id number] ? ?HPI  ? ?Patient is a very pleasant 88 year old Caucasian female who presents today for follow-up of her diabetes.  She is currently using 70/30 insulin and taking 42 units in the morning and 20 units in the evening.  Her sugars sound remarkably well controlled.  The majority of her sugars between 70 and 120 in the morning.  She seldom sees sugars above 200 in the evening.  She denies any hypoglycemic episodes.  Her blood pressure is well controlled today at 142/60.  She denies any chest pain shortness of breath or dyspnea on exertion.  She denies any neuropathy in her feet.  She denies any numbness or tingling.  She denies any polyuria polydipsia or blurry vision.  She denies any orthopnea or paroxysmal nocturnal dyspnea.  There is no significant swelling in her legs.  She does report occasional insomnia 1-2 times a week despite taking lorazepam at night.  I hesitate to increase the dose given her age and the risk of falls. ?Past Medical History:  ?Diagnosis Date  ? Acute biliary pancreatitis 07/2002  ? thia was in 07/2004:she still has pseudocyst in tail of pancreas measuring 54 x 33 mm   ? Adrenal adenoma   ? bilateral  ? Anxiety   ? CAD (coronary artery disease)   ? a. s/p CABG in 08/2014 with LIMA-LAD, SVG-OM and SVG-PDA  ? Chronic pancreatitis ([place])   ? Detached retina   ? Diabetes mellitus ([place])   ? Diverticulosis   ? History of carcinoma in situ of breast 1988  ? Hyperlipidemia   ? Hypertension   ? IBS (irritable bowel syndrome)   ? Legally blind   ? Osteoarthritis   ? Osteopenia   ? Upper GI bleed September2004  ? secondary to gastritis  ? ?Past Surgical History:  ?Procedure Laterality Date  ? CARDIAC CATHETERIZATION N/A 08/24/2014  ? Procedure: Left Heart Cath and Coronary Angiography;  Surgeon: Leonie Man, MD;  Location: [facility];  Service: Cardiovascular;  Laterality: N/A;  ? COLONOSCOPY   08/15/05  ? few tiny diverticula at sigmoid colon/external hemorrhoids but no polyps  ? COLONOSCOPY  01/08/2012  ? KPT:WSFKCLE diverticulosis. Next colonoscopy in 12/2016  ? CORONARY ARTERY BYPASS GRAFT N/A 08/24/2014  ? Procedure: CORONARY ARTERY BYPASS GRAFTING (CABG) x three, using left internal mammary artery and right leg greater saphenous vein harvested endoscopically;  Surgeon: Ivin Poot, MD;  Location: [facility];  Service: Open Heart Surgery;  Laterality: N/A;  ? ECTOPIC PREGNANCY SURGERY  1950's  ? ERCP with sphincterotomy  07/2002  ? ESOPHAGOGASTRODUODENOSCOPY    ?  Gastritis of body.  Otherwise normal  ? ESOPHAGOGASTRODUODENOSCOPY  01/08/2012  ? RMR: Few scattered gastric erosions of uncertain significance-status post biopsy. Minimal chronic inflammation, no H.pylori  ? LAPAROSCOPIC CHOLECYSTECTOMY    ? MASTECTOMY Right 1980  ? PANCREATIC PSEUDOCYST DRAINAGE  [id number]  ? drained percutaneously   ? right mastectomy    ? tacking up of her bladder    ? TEE WITHOUT CARDIOVERSION N/A 08/24/2014  ? Procedure: TRANSESOPHAGEAL ECHOCARDIOGRAM (TEE);  Surgeon: Ivin Poot, MD;  Location: [facility];  Service: Open Heart Surgery;  Laterality: N/A;  ? ?Current Outpatient Medications on File Prior to Visit  ?Medication Sig Dispense Refill  ? amLODipine (NORVASC) 5 MG tablet Take 1 tablet by mouth once daily 30 tablet 0  ?  aspirin EC 81 MG EC tablet Take 1 tablet (81 mg total) by mouth daily.    ? Blood Glucose Monitoring Suppl (BLOOD GLUCOSE SYSTEM PAK) KIT Please dispense as One Touch Ultra. Use as directed to monitor FSBS 4x daily. Dx: E11.65 1 kit 1  ? calcium carbonate (TUMS - DOSED IN MG ELEMENTAL CALCIUM) 500 MG chewable tablet Chew 3 tablets by mouth at bedtime.    ? CREON 24000-76000 units CPEP TAKE 2 CAPSULES BY MOUTH WITH MEALS AND 1 CAP WITH SNACKS x 2 300 capsule 11  ? cycloSPORINE (RESTASIS) 0.05 % ophthalmic emulsion Place 1 drop into both eyes 2 (two) times daily. 3 mL 6  ? EQ ALLERGY RELIEF, CETIRIZINE, 10  MG tablet Take 1 tablet by mouth once daily 90 tablet 0  ? ferrous sulfate 325 (65 FE) MG tablet Take 325 mg by mouth daily with breakfast.    ? FLUoxetine (PROZAC) 20 MG tablet Take 1 tablet by mouth once daily 90 tablet 0  ? furosemide (LASIX) 20 MG tablet Take 1 tablet by mouth once daily 90 tablet 0  ? gabapentin (NEURONTIN) 100 MG capsule TAKE 1 CAPSULE BY MOUTH THREE TIMES DAILY AS NEEDED FOR  NERVE  PAIN 90 capsule 0  ? insulin NPH-regular Human (NOVOLIN 70/30 RELION) (70-30) 100 UNIT/ML injection INJECT 25 UNITS SUBCUTANEOUSLY IN THE MORNING AND 15 IN THE EVENING (Patient taking differently: INJECT 32 UNITS SUBCUTANEOUSLY IN THE MORNING AND 20 IN THE EVENING) 10 mL 2  ? Insulin Syringe-Needle U-100 (BD INSULIN SYRINGE U/F) 31G X 5/16" 0.5 ML MISC USE AS DIRECTED TO INJECT INSULIN SQ 2X DAILY. 500 each 5  ? Lancets Misc. MISC Pt has Accu Chek Aviva Plus - Needs just lancets  Checks BS 4-5 times per day. Disp 3 boxes(90 day Supply)/4 refills Dx code. E11.9 450 each 3  ? LORazepam (ATIVAN) 1 MG tablet TAKE 1/2 TO 1 (ONE-HALF TO ONE) TABLET BY MOUTH TWICE DAILY AS NEEDED 30 tablet 0  ? magnesium oxide (MAG-OX) 400 (240 Mg) MG tablet Take 1 tablet by mouth daily.    ? metoprolol tartrate (LOPRESSOR) 25 MG tablet Take 1 tablet (25 mg total) by mouth 2 (two) times daily. 180 tablet 3  ? Multiple Vitamin (MULTIVITAMIN) capsule Take 1 capsule by mouth every morning.     ? OneTouch Delica Lancets 70Y MISC CHECK BLOOD SUGAR 4-5 TIMES PER DAY 300 each 0  ? ONETOUCH ULTRA test strip USE 1 STRIP TO CHECK GLUCOSE FIVE TIMES DAILY 300 each 2  ? pantoprazole (PROTONIX) 40 MG tablet Take 1 tablet by mouth once daily 90 tablet 1  ? potassium chloride (KLOR-CON) 10 MEQ tablet TAKE 1 TABLET BY MOUTH ONCE DAILY AS NEEDED (TAKE WITH FUROSEMIDE) 90 tablet 3  ? rosuvastatin (CRESTOR) 5 MG tablet Take 1 tablet by mouth once daily 90 tablet 3  ? triamcinolone (KENALOG) 0.1 % APPLY CREAM EXTERNALLY TWICE DAILY 60 g 0  ? Wheat Dextrin  (BENEFIBER DRINK MIX) PACK Take 1 Package by mouth daily as needed (for supplement-digestion).     ? ?No current facility-administered medications on file prior to visit.  ? ?Allergies  ?Allergen Reactions  ? Calcium-Containing Compounds Nausea Only  ? Morphine And Related Other (See Comments)  ?  Hallucination  ? Raloxifene Itching  ?  Evista- Face and eyes burning  ? Vitamin D Analogs Nausea Only  ? ?Social History  ? ?Socioeconomic History  ? Marital status: Widowed  ?  Spouse name: Not on file  ?  Number of children: 5  ? Years of education: Not on file  ? Highest education level: Not on file  ?Occupational History  ? Occupation: homemaker  ?Tobacco Use  ? Smoking status: Never  ? Smokeless tobacco: Never  ?Vaping Use  ? Vaping Use: Never used  ?Substance and Sexual Activity  ? Alcohol use: No  ?  Alcohol/week: 0.0 standard drinks  ? Drug use: No  ? Sexual activity: Not Currently  ?  Birth control/protection: Post-menopausal  ?Other Topics Concern  ? Not on file  ?Social History Narrative  ? Lives in [place].  ? ?Social Determinants of Health  ? ?Financial Resource Strain: Not on file  ?Food Insecurity: Not on file  ?Transportation Needs: Not on file  ?Physical Activity: Not on file  ?Stress: Not on file  ?Social Connections: Not on file  ?Intimate Partner Violence: Not on file  ? ? ?  ? ? ? ? ?Review of Systems  ?All other systems reviewed and are negative. ? ?   ?Objective:  ? Physical Exam ?Constitutional:   ?   General: She is not in acute distress. ?   Appearance: Normal appearance. She is normal weight. She is not ill-appearing or toxic-appearing.  ?Cardiovascular:  ?   Rate and Rhythm: Normal rate and regular rhythm.  ?   Heart sounds: Normal heart sounds. No murmur heard. ?  No friction rub. No gallop.  ?Pulmonary:  ?   Effort: Pulmonary effort is normal. No respiratory distress.  ?   Breath sounds: Normal breath sounds. No stridor. No wheezing, rhonchi or rales.  ?Chest:  ?   Chest wall: No  tenderness.  ?Abdominal:  ?   General: Abdomen is flat. Bowel sounds are normal. There is no distension.  ?   Palpations: Abdomen is soft. There is no mass.  ?   Tenderness: There is no abdominal tenderness.  ?[place]

## 2021-06-21 LAB — CBC WITH DIFFERENTIAL/PLATELET
Absolute Monocytes: 502 cells/uL (ref 200–950)
Basophils Absolute: 31 cells/uL (ref 0–200)
Basophils Relative: 0.5 %
Eosinophils Absolute: 93 cells/uL (ref 15–500)
Eosinophils Relative: 1.5 %
HCT: 39 % (ref 35.0–45.0)
Hemoglobin: 12.9 g/dL (ref 11.7–15.5)
Lymphs Abs: 1693 cells/uL (ref 850–3900)
MCH: 29 pg (ref 27.0–33.0)
MCHC: 33.1 g/dL (ref 32.0–36.0)
MCV: 87.6 fL (ref 80.0–100.0)
MPV: 11.5 fL (ref 7.5–12.5)
Monocytes Relative: 8.1 %
Neutro Abs: 3881 cells/uL (ref 1500–7800)
Neutrophils Relative %: 62.6 %
Platelets: 164 10*3/uL (ref 140–400)
RBC: 4.45 10*6/uL (ref 3.80–5.10)
RDW: 13.2 % (ref 11.0–15.0)
Total Lymphocyte: 27.3 %
WBC: 6.2 10*3/uL (ref 3.8–10.8)

## 2021-06-21 LAB — COMPLETE METABOLIC PANEL WITH GFR
AG Ratio: 1.7 (calc) (ref 1.0–2.5)
ALT: 28 U/L (ref 6–29)
AST: 33 U/L (ref 10–35)
Albumin: 4.8 g/dL (ref 3.6–5.1)
Alkaline phosphatase (APISO): 45 U/L (ref 37–153)
BUN/Creatinine Ratio: 23 (calc) — ABNORMAL HIGH (ref 6–22)
BUN: 26 mg/dL — ABNORMAL HIGH (ref 7–25)
CO2: 25 mmol/L (ref 20–32)
Calcium: 9.7 mg/dL (ref 8.6–10.4)
Chloride: 98 mmol/L (ref 98–110)
Creat: 1.12 mg/dL — ABNORMAL HIGH (ref 0.60–0.95)
Globulin: 2.8 g/dL (calc) (ref 1.9–3.7)
Glucose, Bld: 147 mg/dL — ABNORMAL HIGH (ref 65–99)
Potassium: 4.4 mmol/L (ref 3.5–5.3)
Sodium: 135 mmol/L (ref 135–146)
Total Bilirubin: 0.7 mg/dL (ref 0.2–1.2)
Total Protein: 7.6 g/dL (ref 6.1–8.1)
eGFR: 46 mL/min/{1.73_m2} — ABNORMAL LOW (ref >=60)

## 2021-06-21 LAB — HEMOGLOBIN A1C
Hgb A1c MFr Bld: 6.4 % of total Hgb — ABNORMAL HIGH (ref <5.7)
Mean Plasma Glucose: 137 mg/dL
eAG (mmol/L): 7.6 mmol/L

## 2021-07-01 ENCOUNTER — Other Ambulatory Visit: Payer: Self-pay | Admitting: Family Medicine

## 2021-07-01 NOTE — Telephone Encounter (Signed)
LOV 06/20/21 ?Last refill 05/31/21, #30, 0 refills ? ?Please review, thanks! ? ?

## 2021-07-05 ENCOUNTER — Telehealth: Payer: Self-pay

## 2021-07-05 MED ORDER — FLUOXETINE HCL 20 MG PO CAPS: 20.0000 mg | ORAL_CAPSULE | Freq: Every day | ORAL | 3 refills | Status: DC

## 2021-07-05 NOTE — Telephone Encounter (Signed)
Rx changed and sent to pharmacy.  °

## 2021-07-05 NOTE — Telephone Encounter (Signed)
Pharmacy faxed a refill request for capsules of this med because the tablet form requires a prior authorization. ? ?FLUoxetine (PROZAC) 20 MG tablet [[id number]]  ?  Order Details ?Dose, Route, Frequency: As Directed  ?Dispense Quantity: 90 tablet Refills: 0   ?     ?Sig: Take 1 tablet by mouth once daily  ?     ?Start Date: 05/20/21 End Date: --  ?Written Date: 05/20/21 Expiration Date: 05/20/22  ? ?

## 2021-07-08 ENCOUNTER — Other Ambulatory Visit: Payer: Self-pay | Admitting: Cardiology

## 2021-07-18 ENCOUNTER — Other Ambulatory Visit: Payer: Self-pay | Admitting: Family Medicine

## 2021-07-18 NOTE — Telephone Encounter (Signed)
Requested Prescriptions  ?Pending Prescriptions Disp Refills  ?? ONETOUCH ULTRA test strip [Pharmacy Med Name: OneTouch Ultra Blue In Vitro Strip] 300 each 0  ?  Sig: USE 1 STRIP TO CHECK GLUCOSE FIVE TIMES DAILY  ?  ? Endocrinology: Diabetes - Testing Supplies Passed - 07/18/2021 10:49 AM  ?  ?  Passed - Valid encounter within last 12 months  ?  Recent Outpatient Visits   ?      ? 4 weeks ago Type 2 diabetes mellitus with other specified complication, with long-term current use of insulin ([place])  ? [facility] Pickard, Cammie Mcgee, MD  ? 8 months ago Type 2 diabetes mellitus with other specified complication, with long-term current use of insulin ([place])  ? [facility] Pickard, Cammie Mcgee, MD  ? 1 year ago Type 2 diabetes mellitus with other specified complication, with long-term current use of insulin ([place])  ? [facility] Pickard, Cammie Mcgee, MD  ? 1 year ago Hospital discharge follow-up  ? [facility] Pickard, Cammie Mcgee, MD  ? 2 years ago Acute pain of both knees  ? [facility] Pickard, Cammie Mcgee, MD  ?  ?  ?Future Appointments   ?        ? In 5 months Pickard, Cammie Mcgee, MD [place], [facility]  ?  ? ?  ?  ?  ? ?

## 2021-08-01 ENCOUNTER — Other Ambulatory Visit: Payer: Self-pay | Admitting: Family Medicine

## 2021-08-01 NOTE — Telephone Encounter (Signed)
LOV 06/20/21 Last refill 07/01/21, #30, 0 refills  Please review, thanks!

## 2021-08-01 NOTE — Telephone Encounter (Signed)
Requested medication (s) are due for refill today: yes  Requested medication (s) are on the active medication list: yes    Last refill: 07/01/21  #30  0 refills  Future visit scheduled yes 12/20/21  Notes to clinic:  Not delegated, please review. Thank you.  Requested Prescriptions  Pending Prescriptions Disp Refills   LORazepam (ATIVAN) 1 MG tablet [Pharmacy Med Name: LORazepam 1 MG Oral Tablet] 30 tablet 0    Sig: TAKE 1/2 TO 1 (ONE-HALF TO ONE) TABLET BY MOUTH TWICE DAILY AS NEEDED     Not Delegated - Psychiatry: Anxiolytics/Hypnotics 2 Failed - 08/01/2021 12:04 PM      Failed - This refill cannot be delegated      Failed - Urine Drug Screen completed in last 360 days      Passed - Patient is not pregnant      Passed - Valid encounter within last 6 months    Recent Outpatient Visits           1 month ago Type 2 diabetes mellitus with other specified complication, with long-term current use of insulin ([place])   [place] Pickard, Cammie Mcgee, MD   8 months ago Type 2 diabetes mellitus with other specified complication, with long-term current use of insulin ([place])   [place] Susy Frizzle, MD   1 year ago Type 2 diabetes mellitus with other specified complication, with long-term current use of insulin ([place])   [place] Pickard, Cammie Mcgee, MD   1 year ago Hospital discharge follow-up   [place], Warren T, MD   2 years ago Acute pain of both knees   [place], Cammie Mcgee, MD       Future Appointments             In 4 months Pickard, Cammie Mcgee, MD [place]

## 2021-08-17 ENCOUNTER — Other Ambulatory Visit: Payer: Self-pay | Admitting: Cardiology

## 2021-08-27 ENCOUNTER — Other Ambulatory Visit: Payer: Self-pay | Admitting: Cardiology

## 2021-09-02 ENCOUNTER — Other Ambulatory Visit: Payer: Self-pay | Admitting: Family Medicine

## 2021-09-02 ENCOUNTER — Other Ambulatory Visit: Payer: Self-pay | Admitting: Cardiology

## 2021-09-02 DIAGNOSIS — Z951 Presence of aortocoronary bypass graft: Secondary | ICD-10-CM

## 2021-09-02 NOTE — Telephone Encounter (Signed)
LOV 06/20/21 Last refill 08/01/21, #30, 0 refills  Please review, thanks!

## 2021-09-02 NOTE — Telephone Encounter (Signed)
Requested medication (s) are due for refill today - yes   Requested medication (s) are on the active medication list -yes  Future visit scheduled -yes  Last refill: 08/01/21 #30  Notes to clinic: non delegated Rx  Requested Prescriptions  Pending Prescriptions Disp Refills   LORazepam (ATIVAN) 1 MG tablet [Pharmacy Med Name: LORazepam 1 MG Oral Tablet] 30 tablet 0    Sig: TAKE 1/2 TO 1 (ONE-HALF TO ONE) TABLET BY MOUTH TWICE DAILY AS NEEDED     Not Delegated - Psychiatry: Anxiolytics/Hypnotics 2 Failed - 09/02/2021  3:14 PM      Failed - This refill cannot be delegated      Failed - Urine Drug Screen completed in last 360 days      Passed - Patient is not pregnant      Passed - Valid encounter within last 6 months    Recent Outpatient Visits           2 months ago Type 2 diabetes mellitus with other specified complication, with long-term current use of insulin ([place])   [place] Pickard, Cammie Mcgee, MD   9 months ago Type 2 diabetes mellitus with other specified complication, with long-term current use of insulin ([place])   [place] Pickard, Cammie Mcgee, MD   1 year ago Type 2 diabetes mellitus with other specified complication, with long-term current use of insulin ([place])   [place] Pickard, Cammie Mcgee, MD   1 year ago Hospital discharge follow-up   [place], Warren T, MD   2 years ago Acute pain of both knees   [place], Cammie Mcgee, MD       Future Appointments             In 2 months Branch, Alphonse Guild, MD [address] [place]   In 3 months Pickard, Cammie Mcgee, MD [place], [facility]               Requested Prescriptions  Pending Prescriptions Disp Refills   LORazepam (ATIVAN) 1 MG tablet [Pharmacy Med Name: LORazepam 1 MG Oral Tablet] 30 tablet 0    Sig: TAKE 1/2 TO 1 (ONE-HALF TO ONE) TABLET BY MOUTH TWICE DAILY AS NEEDED      Not Delegated - Psychiatry: Anxiolytics/Hypnotics 2 Failed - 09/02/2021  3:14 PM      Failed - This refill cannot be delegated      Failed - Urine Drug Screen completed in last 360 days      Passed - Patient is not pregnant      Passed - Valid encounter within last 6 months    Recent Outpatient Visits           2 months ago Type 2 diabetes mellitus with other specified complication, with long-term current use of insulin ([place])   [place] Pickard, Cammie Mcgee, MD   9 months ago Type 2 diabetes mellitus with other specified complication, with long-term current use of insulin ([place])   [place] Pickard, Cammie Mcgee, MD   1 year ago Type 2 diabetes mellitus with other specified complication, with long-term current use of insulin ([place])   [place] Pickard, Cammie Mcgee, MD   1 year ago Hospital discharge follow-up   [place], Warren T, MD   2 years ago Acute pain of both knees   [facility]  Family [facility] Pickard, Cammie Mcgee, MD       Future Appointments             In 2 months Branch, Alphonse Guild, MD [place]   In 3 months Pickard, Cammie Mcgee, MD [place]

## 2021-09-13 ENCOUNTER — Other Ambulatory Visit: Payer: Self-pay | Admitting: Family Medicine

## 2021-09-13 NOTE — Telephone Encounter (Signed)
Requested Prescriptions  Pending Prescriptions Disp Refills  . ONETOUCH ULTRA test strip [employer] Med Name: OneTouch Ultra Blue In Vitro Strip] 300 each 0    Sig: USE 1 STRIP TO [place] DAILY     Endocrinology: Diabetes - Testing Supplies Passed - 09/13/2021 11:06 AM      Passed - Valid encounter within last 12 months    Recent Outpatient Visits          2 months ago Type 2 diabetes mellitus with other specified complication, with long-term current use of insulin ([place])   [place] Pickard, Cammie Mcgee, MD   10 months ago Type 2 diabetes mellitus with other specified complication, with long-term current use of insulin ([place])   [place] Pickard, Cammie Mcgee, MD   1 year ago Type 2 diabetes mellitus with other specified complication, with long-term current use of insulin ([place])   [place] Pickard, Cammie Mcgee, MD   1 year ago Hospital discharge follow-up   [employer], Warren T, MD   2 years ago Acute pain of both [place], Cammie Mcgee, MD      Future Appointments            In 2 months Branch, Alphonse Guild, MD [place] at [place]. [place]   In 3 months Pickard, Cammie Mcgee, MD [place]

## 2021-09-18 ENCOUNTER — Other Ambulatory Visit: Payer: Self-pay | Admitting: Family Medicine

## 2021-09-20 ENCOUNTER — Other Ambulatory Visit: Payer: Self-pay | Admitting: Cardiology

## 2021-09-28 ENCOUNTER — Other Ambulatory Visit: Payer: Self-pay | Admitting: Family Medicine

## 2021-09-30 NOTE — Telephone Encounter (Signed)
Requested medications are due for refill today.  yes  Requested medications are on the active medications list.  yes  Last refill. 09/03/2021 #30 0 refills  Future visit scheduled.   yes  Notes to clinic.  Refill is not delegated    Requested Prescriptions  Pending Prescriptions Disp Refills   LORazepam (ATIVAN) 1 MG tablet [Pharmacy Med Name: LORazepam 1 MG Oral Tablet] 30 tablet 0    Sig: TAKE 1/2 TO 1 (ONE-HALF TO ONE) TABLET BY MOUTH TWICE DAILY AS NEEDED     Not Delegated - Psychiatry: Anxiolytics/Hypnotics 2 Failed - 09/28/2021  3:06 PM      Failed - This refill cannot be delegated      Failed - Urine Drug Screen completed in last 360 days      Passed - Patient is not pregnant      Passed - Valid encounter within last 6 months    Recent Outpatient Visits           3 months ago Type 2 diabetes mellitus with other specified complication, with long-term current use of insulin ([place])   [place] Pickard, Cammie Mcgee, MD   10 months ago Type 2 diabetes mellitus with other specified complication, with long-term current use of insulin ([place])   [place] Susy Frizzle, MD   1 year ago Type 2 diabetes mellitus with other specified complication, with long-term current use of insulin ([place])   [place] Pickard, Cammie Mcgee, MD   1 year ago Hospital discharge follow-up   [place], Warren T, MD   2 years ago Acute pain of both knees   [place], Cammie Mcgee, MD       Future Appointments             In 4 days Roselyn Reef [facility], [facility]   In 1 month Branch, Alphonse Guild, MD [place], [place]   In 2 months Pickard, Cammie Mcgee, MD [place], [facility]

## 2021-10-03 NOTE — Progress Notes (Signed)
Primary Care Physician:  Susy Frizzle, MD  Primary GI: Dr. Gala Romney  Patient Location: Home   Provider Location: [facility] office   Reason for Visit: Follow-up   Persons present on the virtual encounter, with roles: Aliene Altes, PA-C (Provider), Misty Edwards (patient), Berdine Dance (daughter)   Total time (minutes) spent on medical discussion: 6 minutes  Virtual Visit via video note Due to COVID-19, visit is conducted virtually and was requested by patient.   I connected with Justin Mend on 10/04/21 at 10:00 AM EDT by video and verified that I am speaking with the correct person using two identifiers.   I discussed the limitations, risks, security and privacy concerns of performing an evaluation and management service by video and the availability of in person appointments. I also discussed with the patient that there may be a patient responsible charge related to this service. The patient expressed understanding and agreed to proceed.  Chief Complaint  Patient presents with   Follow-up     History of Present Illness: 88 y.o. female with history of pancreatic exocrine deficiency, heartburn, Hemoccult positive stool and anemia previously. We had attempted procedures in 2018, but patient became sick with urgently elevated blood pressure when attempting to take colon prep and ended up spending 1 night in the hospital.  Ultimately, anemia resolved on iron supplementation, patient had no overt GI bleeding, and she and her daughter were not interested in any further invasive evaluation given advanced age and prior difficulties with colon prep. She is presenting today for routine follow-up of EPI.   Last seen via telephone visit 07/13/2018.  She was doing well at that time.  No significant GI symptoms.  Felt her quality of life was good and was still working a little out in the yard tending her flowers.  She was taking Prilosec and pancreatic enzymes.  Recommended continuing current  medications.  Today:  Patient and her daughter are both on video call today. Patient states she is doing well overall.  She has no concerns for me today.  She does continue taking Creon 2 capsules with meals and 1 with snacks.  She is also taking pantoprazole 40 mg daily.  She denies abdominal pain, nausea, vomiting, heartburn symptoms, dysphagia, diarrhea, constipation, BRBPR, melena, unintentional weight loss.  She still gets out every morning and works in the yard tending to her flowers.  She is currently receiving Creon through [facility] assist and is needing an updated prescription.  Past Medical History:  Diagnosis Date   Acute biliary pancreatitis 07/2002   thia was in 07/2004:she still has pseudocyst in tail of pancreas measuring 54 x 33 mm    Adrenal adenoma    bilateral   Anxiety    CAD (coronary artery disease)    a. s/p CABG in 08/2014 with LIMA-LAD, SVG-OM and SVG-PDA   Chronic pancreatitis ([place])    Detached retina    Diabetes mellitus ([place])    Diverticulosis    Heartburn    History of carcinoma in situ of breast 1988   Hyperlipidemia    Hypertension    IBS (irritable bowel syndrome)    Legally blind    Osteoarthritis    Osteopenia    Upper GI bleed September2004   secondary to gastritis     Past Surgical History:  Procedure Laterality Date   CARDIAC CATHETERIZATION N/A 08/24/2014   Procedure: Left Heart Cath and Coronary Angiography;  Surgeon: Leonie Man, MD;  Location: [place];  Service: Cardiovascular;  Laterality: N/A;   COLONOSCOPY  08/15/05   few tiny diverticula at sigmoid colon/external hemorrhoids but no polyps   COLONOSCOPY  01/08/2012   RDE:[id number] diverticulosis. Next colonoscopy in 12/2016   CORONARY ARTERY BYPASS GRAFT N/A 08/24/2014   Procedure: CORONARY ARTERY BYPASS GRAFTING (CABG) x three, using left internal mammary artery and right leg greater saphenous vein harvested endoscopically;  Surgeon: Ivin Poot, MD;  Location: [facility];   Service: Open Heart Surgery;  Laterality: N/A;   ECTOPIC PREGNANCY SURGERY  1950's   ERCP with sphincterotomy  07/2002   ESOPHAGOGASTRODUODENOSCOPY      Gastritis of body.  Otherwise normal   ESOPHAGOGASTRODUODENOSCOPY  01/08/2012   RMR: Few scattered gastric erosions of uncertain significance-status post biopsy. Minimal chronic inflammation, no H.pylori   LAPAROSCOPIC CHOLECYSTECTOMY     MASTECTOMY Right 1980   PANCREATIC PSEUDOCYST DRAINAGE  [id number]   drained percutaneously    right mastectomy     tacking up of her bladder     TEE WITHOUT CARDIOVERSION N/A 08/24/2014   Procedure: TRANSESOPHAGEAL ECHOCARDIOGRAM (TEE);  Surgeon: Ivin Poot, MD;  Location: [place];  Service: Open Heart Surgery;  Laterality: N/A;     Current Meds  Medication Sig   amLODipine (NORVASC) 5 MG tablet TAKE 1 TABLET BY MOUTH ONCE DAILY . APPOINTMENT REQUIRED FOR FUTURE REFILLS   aspirin EC 81 MG EC tablet Take 1 tablet (81 mg total) by mouth daily.   Blood Glucose Monitoring Suppl (BLOOD GLUCOSE SYSTEM PAK) KIT Please dispense as One Touch Ultra. Use as directed to monitor FSBS 4x daily. Dx: E11.65   calcium carbonate (TUMS - DOSED IN MG ELEMENTAL CALCIUM) 500 MG chewable tablet Chew 3 tablets by mouth at bedtime.   cetirizine (ZYRTEC) 10 MG tablet Take 1 tablet by mouth once daily   CREON 24000-76000 units CPEP TAKE 2 CAPSULES BY MOUTH WITH MEALS AND 1 CAP WITH SNACKS x 2   ferrous sulfate 325 (65 FE) MG tablet Take 325 mg by mouth daily with breakfast.   FLUoxetine (PROZAC) 20 MG capsule Take 1 capsule (20 mg total) by mouth daily.   furosemide (LASIX) 20 MG tablet Take 1 tablet by mouth once daily   gabapentin (NEURONTIN) 100 MG capsule TAKE 1 CAPSULE BY MOUTH THREE TIMES DAILY AS NEEDED FOR  NERVE  PAIN   insulin NPH-regular Human (NOVOLIN 70/30 RELION) (70-30) 100 UNIT/ML injection INJECT 25 UNITS SUBCUTANEOUSLY IN THE MORNING AND 15 IN THE EVENING (Patient taking differently: INJECT 42 UNITS  SUBCUTANEOUSLY IN THE MORNING AND 20 IN THE EVENING)   Insulin Syringe-Needle U-100 (BD INSULIN SYRINGE U/F) 31G X 5/16" 0.5 ML MISC USE AS DIRECTED TO INJECT INSULIN SQ 2X DAILY.   Lancets Misc. MISC Pt has Accu Chek Aviva Plus - Needs just lancets  Checks BS 4-5 times per day. Disp 3 boxes(90 day Supply)/4 refills Dx code. E11.9   LORazepam (ATIVAN) 1 MG tablet TAKE 1/2 TO 1 (ONE-HALF TO ONE) TABLET BY MOUTH TWICE DAILY AS NEEDED   MAGNESIUM-OXIDE 400 (240 Mg) MG tablet TAKE 1 TABLET BY MOUTH ONCE DAILY . APPOINTMENT REQUIRED FOR FUTURE REFILLS.   metoprolol tartrate (LOPRESSOR) 25 MG tablet Take 1 tablet (25 mg total) by mouth 2 (two) times daily.   Multiple Vitamin (MULTIVITAMIN) capsule Take 1 capsule by mouth every morning.    OneTouch Delica Lancets 02O MISC CHECK BLOOD SUGAR 4-5 TIMES PER DAY   ONETOUCH ULTRA test strip USE 1 STRIP TO CHECK GLUCOSE FIVE  TIMES DAILY   potassium chloride (KLOR-CON) 10 MEQ tablet TAKE 1 TABLET BY MOUTH ONCE DAILY AS NEEDED (TAKE WITH FUROSEMIDE)   rosuvastatin (CRESTOR) 5 MG tablet Take 1 tablet by mouth once daily   triamcinolone (KENALOG) 0.1 % APPLY CREAM EXTERNALLY TWICE DAILY   [DISCONTINUED] pantoprazole (PROTONIX) 40 MG tablet Take 1 tablet by mouth once daily     Family History  Problem Relation Age of Onset   Ovarian cancer Mother        passed   Colon cancer Father 45       passed away in his 54's   Colon cancer Brother 34       surgery inhis 52's doing well now   Heart attack Brother        passed away age 6   Pancreatitis Brother    Kidney disease Daughter    Leukemia Son     Social History   Socioeconomic History   Marital status: Widowed    Spouse name: Not on file   Number of children: 5   Years of education: Not on file   Highest education level: Not on file  Occupational History   Occupation: homemaker  Tobacco Use   Smoking status: Never   Smokeless tobacco: Never  Vaping Use   Vaping Use: Never used  Substance and  Sexual Activity   Alcohol use: No    Alcohol/week: 0.0 standard drinks of alcohol   Drug use: No   Sexual activity: Not Currently    Birth control/protection: Post-menopausal  Other Topics Concern   Not on file  Social History Narrative   Lives in [place].   Social Determinants of Health   Financial Resource Strain: Not on file  Food Insecurity: Not on file  Transportation Needs: Not on file  Physical Activity: Not on file  Stress: Not on file  Social Connections: Not on file       Review of Systems: Gen: Denies fever, chills, cold or flulike symptoms, presyncope, syncope. CV: Denies chest pain, palpitations. Resp: Denies dyspnea, cough. GI: see HPI Heme: See HPI  Observations/Objective: No distress. Alert and oriented. Pleasant. Well nourished. Normal mood and affect. Unable to perform complete physical exam due to video encounter.    Assessment:  88 y.o. female with history of pancreatic exocrine deficiency, heartburn, Hemoccult positive stool and anemia previously. We had attempted procedures in 2018, but patient became sick with urgently elevated blood pressure when attempting to take colon prep and ended up spending 1 night in the hospital.  Ultimately, anemia resolved on iron supplementation, patient had no overt GI bleeding, and she and her daughter were not interested in any further invasive evaluation given advanced age and prior difficulties with colon prep. She is presenting today for routine follow-up of EPI and heartburn.    Exocrine pancreatic insuffiencey: Doing very well on Creon.  Weight remains stable.  No concerning symptoms.  Heartburn: Doing well on pantoprazole 40 mg daily.  Occasional indigestion, but no heartburn symptoms or alarm symptoms.   Plan: Continue Creon 2 capsules with meals and 1 capsule with snacks. Currently receiving through [facility] assist and needing refill. I have asked nursing staff to work on this.  Continue pantoprazole 40  mg daily. Refills sent to pharmacy.  Follow-up in 1 year or sooner if needed.     I discussed the assessment and treatment plan with the patient. The patient was provided an opportunity to ask questions and all were answered. The patient agreed with  the plan and demonstrated an understanding of the instructions.   The patient was advised to call back or seek an in-person evaluation if the symptoms worsen or if the condition fails to improve as anticipated.  I provided 6 minutes of video-face-to-face time during this encounter.  Aliene Altes, PA-C [facility]  10/04/2021

## 2021-10-04 ENCOUNTER — Telehealth (INDEPENDENT_AMBULATORY_CARE_PROVIDER_SITE_OTHER): Payer: PPO | Admitting: Gastroenterology

## 2021-10-04 ENCOUNTER — Telehealth: Payer: Self-pay | Admitting: *Deleted

## 2021-10-04 ENCOUNTER — Telehealth: Payer: Self-pay | Admitting: Gastroenterology

## 2021-10-04 ENCOUNTER — Encounter: Payer: Self-pay | Admitting: Gastroenterology

## 2021-10-04 ENCOUNTER — Other Ambulatory Visit: Payer: Self-pay | Admitting: Family Medicine

## 2021-10-04 VITALS — Ht 63.0 in | Wt 147.5 lb

## 2021-10-04 DIAGNOSIS — K8681 Exocrine pancreatic insufficiency: Secondary | ICD-10-CM | POA: Diagnosis not present

## 2021-10-04 DIAGNOSIS — R12 Heartburn: Secondary | ICD-10-CM

## 2021-10-04 MED ORDER — PANTOPRAZOLE SODIUM 40 MG PO TBEC: 40.0000 mg | DELAYED_RELEASE_TABLET | Freq: Every day | ORAL | 3 refills | Status: DC

## 2021-10-04 NOTE — Patient Instructions (Signed)
Continue taking Creon 2 capsules with meals and 1 capsule with snacks.  Continue pantoprazole 40 mg daily 30 minutes before breakfast.  We will follow-up with you in 1 year or sooner if needed.  It was very nice meeting you today!  Aliene Altes, PA-C [facility]

## 2021-10-04 NOTE — Telephone Encounter (Signed)
Noted. Faxed a new prescription to [facility].

## 2021-10-04 NOTE — Telephone Encounter (Signed)
Misty Edwards: Patient is currently receiving Creon through [facility] and is needing refills on her medication. Looks like she has been approved through December. Can you take care of getting refills for her? Let me know if I need to do anything.   Stacey:  Please arrange follow-up in 1 year.  Okay for virtual visit on Friday with me.

## 2021-10-04 NOTE — Telephone Encounter (Signed)
Misty Edwards, you are scheduled for a virtual visit with your provider today.  Just as we do with appointments in the office, we must obtain your consent to participate.  Your consent will be active for this visit and any virtual visit you may have with one of our providers in the next 365 days.  If you have a MyChart account, I can also send a copy of this consent to you electronically.  All virtual visits are billed to your insurance company just like a traditional visit in the office.  As this is a virtual visit, video technology does not allow for your provider to perform a traditional examination.  This may limit your provider's ability to fully assess your condition.  If your provider identifies any concerns that need to be evaluated in person or the need to arrange testing such as labs, EKG, etc, we will make arrangements to do so.  Although advances in technology are sophisticated, we cannot ensure that it will always work on either your end or our end.  If the connection with a video visit is poor, we may have to switch to a telephone visit.  With either a video or telephone visit, we are not always able to ensure that we have a secure connection.   I need to obtain your verbal consent now.   Are you willing to proceed with your visit today? Patient consent to virtual visit.

## 2021-10-07 NOTE — Telephone Encounter (Signed)
Requested medication (s) are due for refill today: no  Requested medication (s) are on the active medication list: yes  Last refill:  10/03/21  Future visit scheduled: yes  Notes to clinic:  Unable to refill per protocol, last refill by provider on 10/03/21 for 30 days. Possible duplicate request.     Requested Prescriptions  Pending Prescriptions Disp Refills   LORazepam (ATIVAN) 1 MG tablet [Pharmacy Med Name: LORazepam 1 MG Oral Tablet] 30 tablet 0    Sig: TAKE 1/2 TO 1 (ONE-HALF TO ONE) TABLET BY MOUTH TWICE DAILY AS NEEDED     Not Delegated - Psychiatry: Anxiolytics/Hypnotics 2 Failed - 10/04/2021  6:35 PM      Failed - This refill cannot be delegated      Failed - Urine Drug Screen completed in last 360 days      Passed - Patient is not pregnant      Passed - Valid encounter within last 6 months    Recent Outpatient Visits           3 months ago Type 2 diabetes mellitus with other specified complication, with long-term current use of insulin ([place])   [place] Pickard, Cammie Mcgee, MD   10 months ago Type 2 diabetes mellitus with other specified complication, with long-term current use of insulin ([place])   [place] Susy Frizzle, MD   1 year ago Type 2 diabetes mellitus with other specified complication, with long-term current use of insulin ([place])   [place] Pickard, Cammie Mcgee, MD   2 years ago Hospital discharge follow-up   [place], Warren T, MD   2 years ago Acute pain of both knees   [place], Cammie Mcgee, MD       Future Appointments             In 1 month Branch, Alphonse Guild, MD [place], [place]   In 2 months Pickard, Cammie Mcgee, MD [place], [facility]

## 2021-10-14 ENCOUNTER — Other Ambulatory Visit: Payer: Self-pay | Admitting: Family Medicine

## 2021-10-15 NOTE — Telephone Encounter (Signed)
Requested Prescriptions  Pending Prescriptions Disp Refills  . gabapentin (NEURONTIN) 100 MG capsule [Pharmacy Med Name: Gabapentin 100 MG Oral Capsule] 90 capsule 0    Sig: TAKE 1 CAPSULE BY MOUTH THREE TIMES DAILY AS NEEDED FOR NERVE PAIN     Neurology: Anticonvulsants - gabapentin Failed - 10/14/2021  1:12 PM      Failed - Cr in normal range and within 360 days    Creat  Date Value Ref Range Status  06/20/2021 1.12 (H) 0.60 - 0.95 mg/dL Final         Failed - Completed PHQ-2 or PHQ-9 in the last 360 days      Passed - Valid encounter within last 12 months    Recent Outpatient Visits          3 months ago Type 2 diabetes mellitus with other specified complication, with long-term current use of insulin ([place])   [place] Pickard, Cammie Mcgee, MD   11 months ago Type 2 diabetes mellitus with other specified complication, with long-term current use of insulin ([place])   [place] Susy Frizzle, MD   1 year ago Type 2 diabetes mellitus with other specified complication, with long-term current use of insulin ([place])   [place] Pickard, Cammie Mcgee, MD   2 years ago Hospital discharge follow-up   [place], Warren T, MD   2 years ago Acute pain of both [place], Cammie Mcgee, MD      Future Appointments            In 1 month Branch, Alphonse Guild, MD [place], [place]   In 2 months Pickard, Cammie Mcgee, MD [place], [facility]

## 2021-10-18 ENCOUNTER — Other Ambulatory Visit: Payer: Self-pay | Admitting: Cardiology

## 2021-10-29 ENCOUNTER — Telehealth: Payer: Self-pay | Admitting: Pharmacist

## 2021-10-29 NOTE — Progress Notes (Signed)
Chronic Care Management Pharmacy Assistant   Name: CECEILIA CEPHUS  MRN: [id number] DOB: 02/14/1927   Reason for Encounter: Disease State - Diabetes Call     Recent office visits:  06/20/21 Jenna Luo, MD - Family Medicine - Diabetes - Labs were ordered. Follow up as scheduled.   Recent consult visits:  10/04/21 Aliene Altes PA-C - GI - Heartburn -  Continue current medications. Follow up in 1 year.   Hospital visits:  None in previous 6 months  Medications: Outpatient Encounter Medications as of 10/29/2021  Medication Sig   amLODipine (NORVASC) 5 MG tablet TAKE 1 TABLET BY MOUTH ONCE DAILY . APPOINTMENT REQUIRED FOR FUTURE REFILLS   aspirin EC 81 MG EC tablet Take 1 tablet (81 mg total) by mouth daily.   Blood Glucose Monitoring Suppl (BLOOD GLUCOSE SYSTEM PAK) KIT Please dispense as One Touch Ultra. Use as directed to monitor FSBS 4x daily. Dx: E11.65   calcium carbonate (TUMS - DOSED IN MG ELEMENTAL CALCIUM) 500 MG chewable tablet Chew 3 tablets by mouth at bedtime.   cetirizine (ZYRTEC) 10 MG tablet Take 1 tablet by mouth once daily   CREON 24000-76000 units CPEP TAKE 2 CAPSULES BY MOUTH WITH MEALS AND 1 CAP WITH SNACKS x 2   ferrous sulfate 325 (65 FE) MG tablet Take 325 mg by mouth daily with breakfast.   FLUoxetine (PROZAC) 20 MG capsule Take 1 capsule (20 mg total) by mouth daily.   furosemide (LASIX) 20 MG tablet Take 1 tablet by mouth once daily   gabapentin (NEURONTIN) 100 MG capsule TAKE 1 CAPSULE BY MOUTH THREE TIMES DAILY AS NEEDED FOR NERVE PAIN   insulin NPH-regular Human (NOVOLIN 70/30 RELION) (70-30) 100 UNIT/ML injection INJECT 25 UNITS SUBCUTANEOUSLY IN THE MORNING AND 15 IN THE EVENING (Patient taking differently: INJECT 42 UNITS SUBCUTANEOUSLY IN THE MORNING AND 20 IN THE EVENING)   Insulin Syringe-Needle U-100 (BD INSULIN SYRINGE U/F) 31G X 5/16" 0.5 ML MISC USE AS DIRECTED TO INJECT INSULIN SQ 2X DAILY.   Lancets Misc. MISC Pt has Accu Chek Aviva Plus -  Needs just lancets  Checks BS 4-5 times per day. Disp 3 boxes(90 day Supply)/4 refills Dx code. E11.9   LORazepam (ATIVAN) 1 MG tablet TAKE 1/2 TO 1 (ONE-HALF TO ONE) TABLET BY MOUTH TWICE DAILY AS NEEDED   MAGNESIUM-OXIDE 400 (240 Mg) MG tablet TAKE 1 TABLET BY MOUTH ONCE DAILY . APPOINTMENT REQUIRED FOR FUTURE REFILLS.   metoprolol tartrate (LOPRESSOR) 25 MG tablet Take 1 tablet by mouth twice daily   Multiple Vitamin (MULTIVITAMIN) capsule Take 1 capsule by mouth every morning.    OneTouch Delica Lancets 56C MISC CHECK BLOOD SUGAR 4-5 TIMES PER DAY   ONETOUCH ULTRA test strip USE 1 STRIP TO CHECK GLUCOSE FIVE TIMES DAILY   pantoprazole (PROTONIX) 40 MG tablet Take 1 tablet (40 mg total) by mouth daily.   potassium chloride (KLOR-CON) 10 MEQ tablet TAKE 1 TABLET BY MOUTH ONCE DAILY AS NEEDED (TAKE WITH FUROSEMIDE)   rosuvastatin (CRESTOR) 5 MG tablet Take 1 tablet by mouth once daily   triamcinolone (KENALOG) 0.1 % APPLY CREAM EXTERNALLY TWICE DAILY   No facility-administered encounter medications on file as of 10/29/2021.   Current antihyperglycemic regimen:  insulin NPH-regular Human (NOVOLIN 70/30 RELION) (70-30) 100 UNIT/ML injection INJECT 25 UNITS SUBCUTANEOUSLY IN THE MORNING AND 15 IN THE EVENING, Normal Patient taking differently: INJECT 42 UNITS SUBCUTANEOUSLY IN THE MORNING AND 20 IN THE EVENING  What recent interventions/DTPs  have been made to improve glycemic control:  Patient's daughter denied any recent medication changes.   Have there been any recent hospitalizations or ED visits since last visit with CPP?  Patient has not had any hospitalizations or ED visits since last visit with CPP  Patient denies hypoglycemic symptoms, including Pale, Sweaty, Shaky, Hungry, Nervous/irritable, and Vision changes   Patient denies hyperglycemic symptoms, including blurry vision, excessive thirst, fatigue, polyuria, and weakness   How often are you checking your blood sugar? Patients  daughter reported checking blood sugars daily but was not sure her current ranges but reported patient has not complained about her having any issues with them    What are your blood sugars ranging?  Fasting:  Before meals:  After meals:  Bedtime:   During the week, how often does your blood glucose drop below 70?  Patients daughter reported patient has not had any low readings she is aware of.   Are you checking your feet daily/regularly? Patients daughter reported they do keep a check on her feet regularly.    Adherence Review: Is the patient currently on a STATIN medication? Yes Is the patient currently on ACE/ARB medication? No Does the patient have >5 day gap between last estimated fill dates? No   Care Gaps  AWV: 06/05/21 Colonoscopy: done 01/08/12 DM Eye Exam: unknown  DM Foot Exam: due 06/21/22 Microalbumin: unknown  HbgAIC: done 06/20/21 (6.4) DEXA: unknown Mammogram: done 01/12/20   Star Rating Drugs: Rosuvastatin (CRESTOR) 5 MG tablet - last filled 09/02/21 90 days    Future Appointments  Date Time Provider [place]  11/22/2021  1:40 PM Branch, Alphonse Guild, MD [facility] [place] [facility]  12/20/2021  2:00 PM Pickard, Cammie Mcgee, MD [facility] [place], [facility] Clinical Pharmacist Assistant  [phone]

## 2021-10-30 ENCOUNTER — Other Ambulatory Visit: Payer: Self-pay | Admitting: Family Medicine

## 2021-10-31 NOTE — Telephone Encounter (Signed)
Requested medication (s) are due for refill today: yes  Requested medication (s) are on the active medication list:yes  Last refill:  10/03/21  Future visit scheduled:yes  Notes to clinic:  Unable to refill per protocol, cannot delegate.      Requested Prescriptions  Pending Prescriptions Disp Refills   LORazepam (ATIVAN) 1 MG tablet [Pharmacy Med Name: LORazepam 1 MG Oral Tablet] 30 tablet 0    Sig: TAKE 1/2 TO 1 (ONE-HALF TO ONE) TABLET BY MOUTH TWICE DAILY AS NEEDED     Not Delegated - Psychiatry: Anxiolytics/Hypnotics 2 Failed - 10/30/2021  3:37 PM      Failed - This refill cannot be delegated      Failed - Urine Drug Screen completed in last 360 days      Passed - Patient is not pregnant      Passed - Valid encounter within last 6 months    Recent Outpatient Visits           4 months ago Type 2 diabetes mellitus with other specified complication, with long-term current use of insulin ([place])   [place] Pickard, Cammie Mcgee, MD   11 months ago Type 2 diabetes mellitus with other specified complication, with long-term current use of insulin ([place])   [place] Susy Frizzle, MD   1 year ago Type 2 diabetes mellitus with other specified complication, with long-term current use of insulin ([place])   [place] Pickard, Cammie Mcgee, MD   2 years ago Hospital discharge follow-up   [place], Warren T, MD   2 years ago Acute pain of both [place] Pickard, Cammie Mcgee, MD       Future Appointments             In 3 weeks Branch, Alphonse Guild, MD [place]   In 1 month Pickard, Cammie Mcgee, MD [place], [facility]

## 2021-11-08 ENCOUNTER — Other Ambulatory Visit: Payer: Self-pay | Admitting: Family Medicine

## 2021-11-08 NOTE — Telephone Encounter (Signed)
Requested Prescriptions  Pending Prescriptions Disp Refills  . gabapentin (NEURONTIN) 100 MG capsule [Pharmacy Med Name: Gabapentin 100 MG Oral Capsule] 90 capsule 0    Sig: TAKE 1 CAPSULE BY MOUTH THREE TIMES DAILY AS NEEDED FOR NERVE PAIN     Neurology: Anticonvulsants - gabapentin Failed - 11/08/2021  9:45 AM      Failed - Cr in normal range and within 360 days    Creat  Date Value Ref Range Status  06/20/2021 1.12 (H) 0.60 - 0.95 mg/dL Final         Failed - Completed PHQ-2 or PHQ-9 in the last 360 days      Passed - Valid encounter within last 12 months    Recent Outpatient Visits          4 months ago Type 2 diabetes mellitus with other specified complication, with long-term current use of insulin ([place])   [place] Pickard, Cammie Mcgee, MD   12 months ago Type 2 diabetes mellitus with other specified complication, with long-term current use of insulin ([place])   [place] Susy Frizzle, MD   1 year ago Type 2 diabetes mellitus with other specified complication, with long-term current use of insulin ([place])   [place] Pickard, Cammie Mcgee, MD   2 years ago Hospital discharge follow-up   [place], Warren T, MD   2 years ago Acute pain of both [place], Cammie Mcgee, MD      Future Appointments            In 2 weeks Branch, Alphonse Guild, MD [place]   In 1 month Pickard, Cammie Mcgee, MD [place]

## 2021-11-12 ENCOUNTER — Other Ambulatory Visit: Payer: Self-pay | Admitting: Family Medicine

## 2021-11-22 ENCOUNTER — Other Ambulatory Visit: Payer: Self-pay

## 2021-11-22 ENCOUNTER — Ambulatory Visit: Payer: PPO | Attending: Cardiology | Admitting: Cardiology

## 2021-11-22 ENCOUNTER — Encounter: Payer: Self-pay | Admitting: Cardiology

## 2021-11-22 VITALS — BP 132/68 | HR 59 | Ht 63.0 in | Wt 152.6 lb

## 2021-11-22 DIAGNOSIS — E782 Mixed hyperlipidemia: Secondary | ICD-10-CM | POA: Diagnosis not present

## 2021-11-22 DIAGNOSIS — I2510 Atherosclerotic heart disease of native coronary artery without angina pectoris: Secondary | ICD-10-CM | POA: Diagnosis not present

## 2021-11-22 DIAGNOSIS — I10 Essential (primary) hypertension: Secondary | ICD-10-CM | POA: Diagnosis not present

## 2021-11-22 NOTE — Progress Notes (Signed)
Clinical Summary Ms. Hoel is a 88 y.o.female seen today for follow up of the following medical problems.    1. CAD - history of emergent CABG June 2016 after presenting with NSTEMI -t echo 06/2015 LVEF 60-65%, grade II diastolic dysfunction - beta blocker dosing limited due to bradycardia.        - no chest pain. No SOB/DOE - compliant with meds   2. HTN - very lenient with her bp's due to chronic orthostatic symptoms and recurrent falls     - remains with compliatnw with meds   3. Hyperlipidemia - 07/2016 TC 106 TG 85 HDL 67 LDL 22     - labs followed by pcp - remains on statin   4. Mitral regurgitation - moderate by echo 06/2015.  - 10/2016 mild MR.    08/2018 echo: LVEF 60-65%, mild MR.  - 09/2019 LVEF 55%, mild MR   5. Falls - 3 falls over the last 3 weeks - last episode 3 weeks ago. Outside turnig off water. Tripped on wood timber.  - another fall after getting up to walk to bathroom. Had some diarrhea. Sitting on commode, stood up. Thinks maybe leg went to sleep, but not sure - another episode got out of bed. Felt dizzy with standing, fell to floor.   - admit 08/2018 with fall and right acetabular fracture.  - no recent falls   Past Medical History:  Diagnosis Date   Acute biliary pancreatitis 07/2002   thia was in 07/2004:she still has pseudocyst in tail of pancreas measuring 2 x 33 mm    Adrenal adenoma    bilateral   Anxiety    CAD (coronary artery disease)    a. s/p CABG in 08/2014 with LIMA-LAD, SVG-OM and SVG-PDA   Chronic pancreatitis ([place])    Detached retina    Diabetes mellitus ([place])    Diverticulosis    Heartburn    History of carcinoma in situ of breast 1988   Hyperlipidemia    Hypertension    IBS (irritable bowel syndrome)    Legally blind    Osteoarthritis    Osteopenia    Upper GI bleed September2004   secondary to gastritis     Allergies  Allergen Reactions   Calcium-Containing Compounds Nausea Only   Morphine And Related  Other (See Comments)    Hallucination   Raloxifene Itching    Evista- Face and eyes burning   Vitamin D Analogs Nausea Only     Current Outpatient Medications  Medication Sig Dispense Refill   amLODipine (NORVASC) 5 MG tablet TAKE 1 TABLET BY MOUTH ONCE DAILY . APPOINTMENT REQUIRED FOR FUTURE REFILLS 30 tablet 6   aspirin EC 81 MG EC tablet Take 1 tablet (81 mg total) by mouth daily.     Blood Glucose Monitoring Suppl (BLOOD GLUCOSE SYSTEM PAK) KIT Please dispense as One Touch Ultra. Use as directed to monitor FSBS 4x daily. Dx: E11.65 1 kit 1   calcium carbonate (TUMS - DOSED IN MG ELEMENTAL CALCIUM) 500 MG chewable tablet Chew 3 tablets by mouth at bedtime.     cetirizine (ZYRTEC) 10 MG tablet Take 1 tablet by mouth once daily 90 tablet 1   CREON 24000-76000 units CPEP TAKE 2 CAPSULES BY MOUTH WITH MEALS AND 1 CAP WITH SNACKS x 2 300 capsule 11   ferrous sulfate 325 (65 FE) MG tablet Take 325 mg by mouth daily with breakfast.     FLUoxetine (PROZAC) 20 MG capsule  Take 1 capsule (20 mg total) by mouth daily. 90 capsule 3   furosemide (LASIX) 20 MG tablet Take 1 tablet by mouth once daily 90 tablet 0   gabapentin (NEURONTIN) 100 MG capsule TAKE 1 CAPSULE BY MOUTH THREE TIMES DAILY AS NEEDED FOR NERVE PAIN 90 capsule 0   insulin NPH-regular Human (NOVOLIN 70/30 RELION) (70-30) 100 UNIT/ML injection INJECT 25 UNITS SUBCUTANEOUSLY IN THE MORNING AND 15 IN THE EVENING (Patient taking differently: INJECT 42 UNITS SUBCUTANEOUSLY IN THE MORNING AND 20 IN THE EVENING) 10 mL 2   Insulin Syringe-Needle U-100 (BD INSULIN SYRINGE U/F) 31G X 5/16" 0.5 ML MISC USE AS DIRECTED TO INJECT INSULIN SQ 2X DAILY. 500 each 5   Lancets Misc. MISC Pt has Accu Chek Aviva Plus - Needs just lancets  Checks BS 4-5 times per day. Disp 3 boxes(90 day Supply)/4 refills Dx code. E11.9 450 each 3   LORazepam (ATIVAN) 1 MG tablet TAKE 1/2 TO 1 (ONE-HALF TO ONE) TABLET BY MOUTH TWICE DAILY AS NEEDED 30 tablet 0    MAGNESIUM-OXIDE 400 (240 Mg) MG tablet TAKE 1 TABLET BY MOUTH ONCE DAILY . APPOINTMENT REQUIRED FOR FUTURE REFILLS. 30 tablet 2   metoprolol tartrate (LOPRESSOR) 25 MG tablet Take 1 tablet by mouth twice daily 180 tablet 0   Multiple Vitamin (MULTIVITAMIN) capsule Take 1 capsule by mouth every morning.      OneTouch Delica Lancets 77N MISC CHECK BLOOD SUGAR 4-5 TIMES PER DAY 300 each 0   ONETOUCH ULTRA test strip USE 1 STRIP TO CHECK GLUCOSE FIVE TIMES DAILY 300 each 0   pantoprazole (PROTONIX) 40 MG tablet Take 1 tablet (40 mg total) by mouth daily. 90 tablet 3   potassium chloride (KLOR-CON) 10 MEQ tablet TAKE 1 TABLET BY MOUTH ONCE DAILY AS NEEDED (TAKE WITH FUROSEMIDE) 90 tablet 3   rosuvastatin (CRESTOR) 5 MG tablet Take 1 tablet by mouth once daily 90 tablet 0   triamcinolone (KENALOG) 0.1 % APPLY CREAM EXTERNALLY TWICE DAILY 60 g 0   No current facility-administered medications for this visit.     Past Surgical History:  Procedure Laterality Date   CARDIAC CATHETERIZATION N/A 08/24/2014   Procedure: Left Heart Cath and Coronary Angiography;  Surgeon: Leonie Man, MD;  Location: [facility];  Service: Cardiovascular;  Laterality: N/A;   COLONOSCOPY  08/15/05   few tiny diverticula at sigmoid colon/external hemorrhoids but no polyps   COLONOSCOPY  01/08/2012   HAF:[id number] diverticulosis. Next colonoscopy in 12/2016   CORONARY ARTERY BYPASS GRAFT N/A 08/24/2014   Procedure: CORONARY ARTERY BYPASS GRAFTING (CABG) x three, using left internal mammary artery and right leg greater saphenous vein harvested endoscopically;  Surgeon: Ivin Poot, MD;  Location: [facility];  Service: Open Heart Surgery;  Laterality: N/A;   ECTOPIC PREGNANCY SURGERY  1950's   ERCP with sphincterotomy  07/2002   ESOPHAGOGASTRODUODENOSCOPY      Gastritis of body.  Otherwise normal   ESOPHAGOGASTRODUODENOSCOPY  01/08/2012   RMR: Few scattered gastric erosions of uncertain significance-status post biopsy.  Minimal chronic inflammation, no H.pylori   LAPAROSCOPIC CHOLECYSTECTOMY     MASTECTOMY Right 1980   PANCREATIC PSEUDOCYST DRAINAGE  [id number]   drained percutaneously    right mastectomy     tacking up of her bladder     TEE WITHOUT CARDIOVERSION N/A 08/24/2014   Procedure: TRANSESOPHAGEAL ECHOCARDIOGRAM (TEE);  Surgeon: Ivin Poot, MD;  Location: [facility];  Service: Open Heart Surgery;  Laterality: N/A;  Allergies  Allergen Reactions   Calcium-Containing Compounds Nausea Only   Morphine And Related Other (See Comments)    Hallucination   Raloxifene Itching    Evista- Face and eyes burning   Vitamin D Analogs Nausea Only      Family History  Problem Relation Age of Onset   Ovarian cancer Mother        passed   Colon cancer Father 47       passed away in his 56's   Colon cancer Brother 6       surgery inhis 12's doing well now   Heart attack Brother        passed away age 47   Pancreatitis Brother    Kidney disease Daughter    Leukemia Son      Social History Ms. Lippert reports that she has never smoked. She has never used smokeless tobacco. Ms. Goodwill reports no history of alcohol use.   Review of Systems CONSTITUTIONAL: No weight loss, fever, chills, weakness or fatigue.  HEENT: Eyes: No visual loss, blurred vision, double vision or yellow sclerae.No hearing loss, sneezing, congestion, runny nose or sore throat.  SKIN: No rash or itching.  CARDIOVASCULAR: per hpi RESPIRATORY: No shortness of breath, cough or sputum.  GASTROINTESTINAL: No anorexia, nausea, vomiting or diarrhea. No abdominal pain or blood.  GENITOURINARY: No burning on urination, no polyuria NEUROLOGICAL: No headache, dizziness, syncope, paralysis, ataxia, numbness or tingling in the extremities. No change in bowel or bladder control.  MUSCULOSKELETAL: No muscle, back pain, joint pain or stiffness.  LYMPHATICS: No enlarged nodes. No history of splenectomy.  PSYCHIATRIC: No history of  depression or anxiety.  ENDOCRINOLOGIC: No reports of sweating, cold or heat intolerance. No polyuria or polydipsia.  Marland Kitchen   Physical Examination Today's Vitals   11/22/21 1334  BP: 132/68  Pulse: (!) 59  SpO2: 95%  Weight: 152 lb 9.6 oz (69.2 kg)  Height: '5\' 3"'  (1.6 m)   Body mass index is 27.03 kg/m.  Gen: resting comfortably, no acute distress HEENT: no scleral icterus, pupils equal round and reactive, no palptable cervical adenopathy,  CV: RRR, no m/r/g, no jvd Resp: Clear to auscultation bilaterally GI: abdomen is soft, non-tender, non-distended, normal bowel sounds, no hepatosplenomegaly MSK: extremities are warm, no edema.  Skin: warm, no rash Neuro:  no focal deficits Psych: appropriate affect   Diagnostic Studies     Assessment and Plan   CAD -no symptoms, continue currnt meds     2. HTN - she is at goal, continue current meds   3. Hyperlipidemia -repeat lipid panel, continue statin       Arnoldo Lenis, M.D.

## 2021-11-22 NOTE — Patient Instructions (Signed)
Medication Instructions:  Your physician recommends that you continue on your current medications as directed. Please refer to the Current Medication list given to you today.   Labwork: Fasting Lipid Panel- Nothing to eat/drink 8 hours prior to lab work   Testing/Procedures: None  Follow-Up: Follow up with Dr. Harl Bowie in 6 months.   Any Other Special Instructions Will Be Listed Below (If Applicable).     If you need a refill on your cardiac medications before your next appointment, please call your pharmacy.

## 2021-11-29 ENCOUNTER — Other Ambulatory Visit: Payer: Self-pay | Admitting: Family Medicine

## 2021-11-29 ENCOUNTER — Other Ambulatory Visit: Payer: Self-pay | Admitting: Cardiology

## 2021-11-29 ENCOUNTER — Other Ambulatory Visit (HOSPITAL_COMMUNITY)
Admission: RE | Admit: 2021-11-29 | Discharge: 2021-11-29 | Disposition: A | Discharge status: Home / Self Care | Payer: PPO | Source: Ambulatory Visit | Attending: Cardiology | Admitting: Cardiology

## 2021-11-29 DIAGNOSIS — R12 Heartburn: Secondary | ICD-10-CM

## 2021-11-29 DIAGNOSIS — E782 Mixed hyperlipidemia: Secondary | ICD-10-CM | POA: Diagnosis present

## 2021-11-29 LAB — LIPID PANEL
Cholesterol: 107 mg/dL (ref 0–200)
HDL: 51 mg/dL (ref >40)
LDL Cholesterol: 36 mg/dL (ref 0–99)
Total CHOL/HDL Ratio: 2.1 RATIO
Triglycerides: 99 mg/dL (ref <150)
VLDL: 20 mg/dL (ref 0–40)

## 2021-12-09 ENCOUNTER — Telehealth: Payer: Self-pay

## 2021-12-09 NOTE — Telephone Encounter (Signed)
[name] notified and verbalized understanding.

## 2021-12-09 NOTE — Telephone Encounter (Signed)
-----   Message from Laurine Blazer, LPN sent at [phone]  5:59 PM EDT -----  ----- Message ----- From: Arnoldo Lenis, MD Sent: 12/06/2021  12:08 PM EDT To: Laurine Blazer, LPN  Cholesterol looks good, no changes  Zandra Abts MD

## 2021-12-16 ENCOUNTER — Other Ambulatory Visit: Payer: Self-pay | Admitting: Family Medicine

## 2021-12-16 ENCOUNTER — Other Ambulatory Visit: Payer: Self-pay | Admitting: Cardiology

## 2021-12-16 DIAGNOSIS — Z951 Presence of aortocoronary bypass graft: Secondary | ICD-10-CM

## 2021-12-20 ENCOUNTER — Ambulatory Visit (INDEPENDENT_AMBULATORY_CARE_PROVIDER_SITE_OTHER): Payer: PPO | Admitting: Family Medicine

## 2021-12-20 VITALS — BP 120/72 | HR 70 | Temp 97.7°F | Ht 63.0 in | Wt 156.0 lb

## 2021-12-20 DIAGNOSIS — Z794 Long term (current) use of insulin: Secondary | ICD-10-CM | POA: Diagnosis not present

## 2021-12-20 DIAGNOSIS — E1169 Type 2 diabetes mellitus with other specified complication: Secondary | ICD-10-CM | POA: Diagnosis not present

## 2021-12-20 DIAGNOSIS — N1832 Chronic kidney disease, stage 3b: Secondary | ICD-10-CM

## 2021-12-20 NOTE — Progress Notes (Signed)
Subjective:    Patient ID: Misty Edwards, female    DOB: 22-Jul-1926, 88 y.o.   MRN: [id number]  HPI   Patient is a very pleasant 88 year old Caucasian female who presents today for follow-up of her diabetes.  She is currently using 70/30 insulin and taking 42 units in the morning and 20 units in the evening.  Overall she seems to be doing outstanding.  She denies any chest pain or shortness of breath or dyspnea on exertion.  She denies any numbness in her feet.  She denies any burning or stinging pain in her feet.  She denies any hypoglycemia.  Her blood pressure today is outstanding at 120/72.  She is due for a flu shot.  Otherwise she is doing well with no concerns Past Medical History:  Diagnosis Date   Acute biliary pancreatitis 07/2002   thia was in 07/2004:she still has pseudocyst in tail of pancreas measuring 54 x 33 mm    Adrenal adenoma    bilateral   Anxiety    CAD (coronary artery disease)    a. s/p CABG in 08/2014 with LIMA-LAD, SVG-OM and SVG-PDA   Chronic pancreatitis ([place])    Detached retina    Diabetes mellitus ([place])    Diverticulosis    Heartburn    History of carcinoma in situ of breast 1988   Hyperlipidemia    Hypertension    IBS (irritable bowel syndrome)    Legally blind    Osteoarthritis    Osteopenia    Upper GI bleed September2004   secondary to gastritis   Past Surgical History:  Procedure Laterality Date   CARDIAC CATHETERIZATION N/A 08/24/2014   Procedure: Left Heart Cath and Coronary Angiography;  Surgeon: Leonie Man, MD;  Location: [facility];  Service: Cardiovascular;  Laterality: N/A;   COLONOSCOPY  08/15/05   few tiny diverticula at sigmoid colon/external hemorrhoids but no polyps   COLONOSCOPY  01/08/2012   [id number]:[id number] diverticulosis. Next colonoscopy in 12/2016   CORONARY ARTERY BYPASS GRAFT N/A 08/24/2014   Procedure: CORONARY ARTERY BYPASS GRAFTING (CABG) x three, using left internal mammary artery and right leg greater saphenous  vein harvested endoscopically;  Surgeon: Ivin Poot, MD;  Location: [facility];  Service: Open Heart Surgery;  Laterality: N/A;   ECTOPIC PREGNANCY SURGERY  1950's   ERCP with sphincterotomy  07/2002   ESOPHAGOGASTRODUODENOSCOPY      Gastritis of body.  Otherwise normal   ESOPHAGOGASTRODUODENOSCOPY  01/08/2012   RMR: Few scattered gastric erosions of uncertain significance-status post biopsy. Minimal chronic inflammation, no H.pylori   LAPAROSCOPIC CHOLECYSTECTOMY     MASTECTOMY Right 1980   PANCREATIC PSEUDOCYST DRAINAGE  [id number]   drained percutaneously    right mastectomy     tacking up of her bladder     TEE WITHOUT CARDIOVERSION N/A 08/24/2014   Procedure: TRANSESOPHAGEAL ECHOCARDIOGRAM (TEE);  Surgeon: Ivin Poot, MD;  Location: [facility];  Service: Open Heart Surgery;  Laterality: N/A;   Current Outpatient Medications on File Prior to Visit  Medication Sig Dispense Refill   amLODipine (NORVASC) 5 MG tablet TAKE 1 TABLET BY MOUTH ONCE DAILY . APPOINTMENT REQUIRED FOR FUTURE REFILLS 30 tablet 6   aspirin EC 81 MG EC tablet Take 1 tablet (81 mg total) by mouth daily.     Blood Glucose Monitoring Suppl (BLOOD GLUCOSE SYSTEM PAK) KIT Please dispense as One Touch Ultra. Use as directed to monitor FSBS 4x daily. Dx: E11.65 1 kit 1  calcium carbonate (TUMS - DOSED IN MG ELEMENTAL CALCIUM) 500 MG chewable tablet Chew 3 tablets by mouth at bedtime.     CREON 24000-76000 units CPEP TAKE 2 CAPSULES BY MOUTH WITH MEALS AND 1 CAP WITH SNACKS x 2 300 capsule 11   EQ ALLERGY RELIEF, CETIRIZINE, 10 MG tablet Take 1 tablet by mouth once daily 90 tablet 0   ferrous sulfate 325 (65 FE) MG tablet Take 325 mg by mouth daily with breakfast.     FLUoxetine (PROZAC) 20 MG capsule Take 1 capsule (20 mg total) by mouth daily. 90 capsule 3   furosemide (LASIX) 20 MG tablet Take 1 tablet by mouth once daily 90 tablet 0   gabapentin (NEURONTIN) 100 MG capsule TAKE 1 CAPSULE BY MOUTH THREE TIMES DAILY AS NEEDED  FOR NERVE PAIN 90 capsule 0   insulin NPH-regular Human (NOVOLIN 70/30 RELION) (70-30) 100 UNIT/ML injection INJECT 25 UNITS SUBCUTANEOUSLY IN THE MORNING AND 15 IN THE EVENING (Patient taking differently: INJECT 42 UNITS SUBCUTANEOUSLY IN THE MORNING AND 20 IN THE EVENING) 10 mL 2   Insulin Syringe-Needle U-100 (BD INSULIN SYRINGE U/F) 31G X 5/16" 0.5 ML MISC USE AS DIRECTED TO INJECT INSULIN SQ 2X DAILY. 500 each 5   Lancets Misc. MISC Pt has Accu Chek Aviva Plus - Needs just lancets  Checks BS 4-5 times per day. Disp 3 boxes(90 day Supply)/4 refills Dx code. E11.9 450 each 3   LORazepam (ATIVAN) 1 MG tablet TAKE 1/2 TO 1 (ONE-HALF TO ONE) TABLET BY MOUTH TWICE DAILY AS NEEDED 30 tablet 0   MAGNESIUM-OXIDE 400 (240 Mg) MG tablet TAKE 1 TABLET BY MOUTH ONCE DAILY . APPOINTMENT REQUIRED FOR FUTURE REFILLS. 30 tablet 2   metoprolol tartrate (LOPRESSOR) 25 MG tablet Take 1 tablet by mouth twice daily 180 tablet 0   Multiple Vitamin (MULTIVITAMIN) capsule Take 1 capsule by mouth every morning.      OneTouch Delica Lancets 44I MISC CHECK BLOOD SUGAR 4-5 TIMES PER DAY 300 each 0   ONETOUCH ULTRA test strip USE 1 STRIP TO CHECK GLUCOSE FIVE TIMES DAILY 300 each 0   pantoprazole (PROTONIX) 40 MG tablet Take 1 tablet by mouth once daily 90 tablet 0   potassium chloride (KLOR-CON) 10 MEQ tablet TAKE 1 TABLET BY MOUTH ONCE DAILY AS NEEDED (TAKE WITH FUROSEMIDE) 90 tablet 3   rosuvastatin (CRESTOR) 5 MG tablet Take 1 tablet by mouth once daily 90 tablet 3   triamcinolone (KENALOG) 0.1 % APPLY CREAM EXTERNALLY TWICE DAILY 60 g 0   No current facility-administered medications on file prior to visit.   Allergies  Allergen Reactions   Calcium-Containing Compounds Nausea Only   Morphine And Related Other (See Comments)    Hallucination   Raloxifene Itching    Evista- Face and eyes burning   Vitamin D Analogs Nausea Only   Social History   Socioeconomic History   Marital status: Widowed    Spouse name:  Not on file   Number of children: 5   Years of education: Not on file   Highest education level: Not on file  Occupational History   Occupation: homemaker  Tobacco Use   Smoking status: Never   Smokeless tobacco: Never  Vaping Use   Vaping Use: Never used  Substance and Sexual Activity   Alcohol use: No    Alcohol/week: 0.0 standard drinks of alcohol   Drug use: No   Sexual activity: Not Currently    Birth control/protection: Post-menopausal  Other Topics  Concern   Not on file  Social History Narrative   Lives in [place].   Social Determinants of Health   Financial Resource Strain: Not on file  Food Insecurity: Not on file  Transportation Needs: Not on file  Physical Activity: Not on file  Stress: Not on file  Social Connections: Not on file  Intimate Partner Violence: Not on file          Review of Systems  All other systems reviewed and are negative.      Objective:   Physical Exam Constitutional:      General: She is not in acute distress.    Appearance: Normal appearance. She is normal weight. She is not ill-appearing or toxic-appearing.  Cardiovascular:     Rate and Rhythm: Normal rate and regular rhythm.     Heart sounds: Normal heart sounds. No murmur heard.    No friction rub. No gallop.  Pulmonary:     Effort: Pulmonary effort is normal. No respiratory distress.     Breath sounds: Normal breath sounds. No stridor. No wheezing, rhonchi or rales.  Chest:     Chest wall: No tenderness.  Abdominal:     General: Abdomen is flat. Bowel sounds are normal. There is no distension.     Palpations: Abdomen is soft. There is no mass.     Tenderness: There is no abdominal tenderness.  Musculoskeletal:     Right lower leg: No edema.     Left lower leg: No edema.  Skin:    Findings: No erythema or rash.  Neurological:     General: No focal deficit present.     Mental Status: She is alert and oriented to person, place, and time. Mental status is at  baseline.     Cranial Nerves: No cranial nerve deficit.     Sensory: No sensory deficit.     Motor: Weakness present.     Coordination: Coordination normal.           Assessment & Plan:  Type 2 diabetes mellitus with other specified complication, with long-term current use of insulin (HCC) - Plan: CBC with Differential/Platelet, COMPLETE METABOLIC PANEL WITH GFR, Hemoglobin A1c, CANCELED: Lipid panel  Stage 3b chronic kidney disease ([place]) Patient looks beautiful today.  She seems to be doing outstanding.  Her review of systems is negative.  I will make no changes in her medication at this time.  She appears to be euvolemic and her blood pressure is well controlled.  I will check a CBC a CMP and an A1c.  Goal A1c is less than 7.  Cardiology checked her cholesterol last month and it was outstanding.  Patient received her flu shot today

## 2021-12-21 LAB — COMPLETE METABOLIC PANEL WITH GFR
AG Ratio: 1.8 (calc) (ref 1.0–2.5)
ALT: 23 U/L (ref 6–29)
AST: 22 U/L (ref 10–35)
Albumin: 4.7 g/dL (ref 3.6–5.1)
Alkaline phosphatase (APISO): 47 U/L (ref 37–153)
BUN/Creatinine Ratio: 18 (calc) (ref 6–22)
BUN: 28 mg/dL — ABNORMAL HIGH (ref 7–25)
CO2: 26 mmol/L (ref 20–32)
Calcium: 9.7 mg/dL (ref 8.6–10.4)
Chloride: 95 mmol/L — ABNORMAL LOW (ref 98–110)
Creat: 1.53 mg/dL — ABNORMAL HIGH (ref 0.60–0.95)
Globulin: 2.6 g/dL (calc) (ref 1.9–3.7)
Glucose, Bld: 170 mg/dL — ABNORMAL HIGH (ref 65–99)
Potassium: 4.2 mmol/L (ref 3.5–5.3)
Sodium: 134 mmol/L — ABNORMAL LOW (ref 135–146)
Total Bilirubin: 0.7 mg/dL (ref 0.2–1.2)
Total Protein: 7.3 g/dL (ref 6.1–8.1)
eGFR: 32 mL/min/{1.73_m2} — ABNORMAL LOW (ref >=60)

## 2021-12-21 LAB — CBC WITH DIFFERENTIAL/PLATELET
Absolute Monocytes: 454 cells/uL (ref 200–950)
Basophils Absolute: 32 cells/uL (ref 0–200)
Basophils Relative: 0.5 %
Eosinophils Absolute: 141 cells/uL (ref 15–500)
Eosinophils Relative: 2.2 %
HCT: 39.2 % (ref 35.0–45.0)
Hemoglobin: 13 g/dL (ref 11.7–15.5)
Lymphs Abs: 1664 cells/uL (ref 850–3900)
MCH: 29.1 pg (ref 27.0–33.0)
MCHC: 33.2 g/dL (ref 32.0–36.0)
MCV: 87.9 fL (ref 80.0–100.0)
MPV: 11.1 fL (ref 7.5–12.5)
Monocytes Relative: 7.1 %
Neutro Abs: 4109 cells/uL (ref 1500–7800)
Neutrophils Relative %: 64.2 %
Platelets: 179 10*3/uL (ref 140–400)
RBC: 4.46 10*6/uL (ref 3.80–5.10)
RDW: 13.2 % (ref 11.0–15.0)
Total Lymphocyte: 26 %
WBC: 6.4 10*3/uL (ref 3.8–10.8)

## 2021-12-21 LAB — HEMOGLOBIN A1C
Hgb A1c MFr Bld: 6.6 % of total Hgb — ABNORMAL HIGH (ref <5.7)
Mean Plasma Glucose: 143 mg/dL
eAG (mmol/L): 7.9 mmol/L

## 2021-12-23 ENCOUNTER — Other Ambulatory Visit: Payer: Self-pay | Admitting: Family Medicine

## 2021-12-23 ENCOUNTER — Other Ambulatory Visit: Payer: Self-pay | Admitting: Cardiology

## 2022-01-06 ENCOUNTER — Other Ambulatory Visit: Payer: PPO

## 2022-01-08 ENCOUNTER — Other Ambulatory Visit: Payer: PPO

## 2022-01-10 ENCOUNTER — Telehealth: Payer: Self-pay

## 2022-01-10 ENCOUNTER — Other Ambulatory Visit: Payer: PPO

## 2022-01-10 ENCOUNTER — Other Ambulatory Visit: Payer: Self-pay | Admitting: Family Medicine

## 2022-01-10 DIAGNOSIS — N1832 Chronic kidney disease, stage 3b: Secondary | ICD-10-CM

## 2022-01-10 DIAGNOSIS — N904 Leukoplakia of vulva: Secondary | ICD-10-CM

## 2022-01-10 DIAGNOSIS — Z794 Long term (current) use of insulin: Secondary | ICD-10-CM

## 2022-01-10 MED ORDER — TRIAMCINOLONE ACETONIDE 0.1 % EX CREA: TOPICAL_CREAM | Freq: Two times a day (BID) | CUTANEOUS | 0 refills | Status: DC

## 2022-01-10 NOTE — Progress Notes (Unsigned)
Met

## 2022-01-10 NOTE — Telephone Encounter (Signed)
RX REFILL: Pt asks for refill on her Kenalog 0.1% cream. Last RF was 04/06/2020. Pt states she uses it for external itching to groin area.  Uses [facility].

## 2022-01-11 LAB — COMPREHENSIVE METABOLIC PANEL
AG Ratio: 1.8 (calc) (ref 1.0–2.5)
ALT: 18 U/L (ref 6–29)
AST: 21 U/L (ref 10–35)
Albumin: 4.2 g/dL (ref 3.6–5.1)
Alkaline phosphatase (APISO): 35 U/L — ABNORMAL LOW (ref 37–153)
BUN/Creatinine Ratio: 18 (calc) (ref 6–22)
BUN: 26 mg/dL — ABNORMAL HIGH (ref 7–25)
CO2: 22 mmol/L (ref 20–32)
Calcium: 9.3 mg/dL (ref 8.6–10.4)
Chloride: 99 mmol/L (ref 98–110)
Creat: 1.47 mg/dL — ABNORMAL HIGH (ref 0.60–0.95)
Globulin: 2.4 g/dL (calc) (ref 1.9–3.7)
Glucose, Bld: 163 mg/dL — ABNORMAL HIGH (ref 65–99)
Potassium: 4.7 mmol/L (ref 3.5–5.3)
Sodium: 133 mmol/L — ABNORMAL LOW (ref 135–146)
Total Bilirubin: 0.7 mg/dL (ref 0.2–1.2)
Total Protein: 6.6 g/dL (ref 6.1–8.1)

## 2022-01-14 ENCOUNTER — Other Ambulatory Visit: Payer: PPO

## 2022-01-14 ENCOUNTER — Other Ambulatory Visit: Payer: Self-pay | Admitting: Cardiology

## 2022-01-16 ENCOUNTER — Other Ambulatory Visit: Payer: Self-pay

## 2022-01-16 DIAGNOSIS — N289 Disorder of kidney and ureter, unspecified: Secondary | ICD-10-CM

## 2022-01-21 ENCOUNTER — Ambulatory Visit (HOSPITAL_COMMUNITY)
Admission: RE | Admit: 2022-01-21 | Discharge: 2022-01-21 | Disposition: A | Discharge status: Home / Self Care | Payer: PPO | Source: Ambulatory Visit | Attending: Family Medicine | Admitting: Family Medicine

## 2022-01-21 DIAGNOSIS — N261 Atrophy of kidney (terminal): Secondary | ICD-10-CM | POA: Diagnosis not present

## 2022-01-21 DIAGNOSIS — N289 Disorder of kidney and ureter, unspecified: Secondary | ICD-10-CM | POA: Insufficient documentation

## 2022-01-27 ENCOUNTER — Other Ambulatory Visit: Payer: Self-pay | Admitting: Family Medicine

## 2022-01-30 ENCOUNTER — Other Ambulatory Visit: Payer: Self-pay | Admitting: Family Medicine

## 2022-02-17 ENCOUNTER — Other Ambulatory Visit: Payer: Self-pay | Admitting: Family Medicine

## 2022-02-17 DIAGNOSIS — Z794 Long term (current) use of insulin: Secondary | ICD-10-CM

## 2022-02-27 ENCOUNTER — Other Ambulatory Visit: Payer: Self-pay | Admitting: Family Medicine

## 2022-02-27 ENCOUNTER — Other Ambulatory Visit: Payer: Self-pay | Admitting: Cardiology

## 2022-02-27 NOTE — Telephone Encounter (Signed)
Requested medications are due for refill today.  unsure  Requested medications are on the active medications list.  yes  Last refill. 11/29/2021 #90 0 rf  Future visit scheduled.   yes  Notes to clinic.  Refill not delegated.    Requested Prescriptions  Pending Prescriptions Disp Refills   LORazepam (ATIVAN) 1 MG tablet [Pharmacy Med Name: LORazepam 1 MG Oral Tablet] 30 tablet 0    Sig: TAKE 1/2 TO 1 (ONE-HALF TO ONE) TABLET BY MOUTH TWICE DAILY AS NEEDED     Not Delegated - Psychiatry: Anxiolytics/Hypnotics 2 Failed - 02/27/2022 12:46 PM      Failed - This refill cannot be delegated      Failed - Urine Drug Screen completed in last 360 days      Failed - Valid encounter within last 6 months    Recent Outpatient Visits           8 months ago Type 2 diabetes mellitus with other specified complication, with long-term current use of insulin ([place])   [place] Pickard, Cammie Mcgee, MD   1 year ago Type 2 diabetes mellitus with other specified complication, with long-term current use of insulin ([place])   [place] Susy Frizzle, MD   2 years ago Type 2 diabetes mellitus with other specified complication, with long-term current use of insulin ([place])   [place] Pickard, Cammie Mcgee, MD   2 years ago Hospital discharge follow-up   [place], Warren T, MD   2 years ago Acute pain of both [place], Cammie Mcgee, MD       Future Appointments             In 3 months Branch, Alphonse Guild, MD [facility] [place] at [place]. [place], [place]   In 3 months Pickard, Cammie Mcgee, MD [place], [place] - Patient is not pregnant      Signed Prescriptions Disp Refills   EQ ALLERGY RELIEF, CETIRIZINE, 10 MG tablet 90 tablet 0    Sig: Take 1 tablet by mouth once daily     Ear, Nose, and Throat:  Antihistamines 2  Failed - 02/27/2022 12:46 PM      Failed - Cr in normal range and within 360 days    Creat  Date Value Ref Range Status  01/10/2022 1.47 (H) 0.60 - 0.95 mg/dL Final         Passed - Valid encounter within last 12 months    Recent Outpatient Visits           8 months ago Type 2 diabetes mellitus with other specified complication, with long-term current use of insulin ([place])   [place] Pickard, Cammie Mcgee, MD   1 year ago Type 2 diabetes mellitus with other specified complication, with long-term current use of insulin ([place])   [place] Susy Frizzle, MD   2 years ago Type 2 diabetes mellitus with other specified complication, with long-term current use of insulin ([place])   [place] Pickard, Cammie Mcgee, MD   2 years ago Hospital discharge follow-up   [place], Warren T, MD   2 years ago Acute pain of both [place], Cammie Mcgee,  MD       Future Appointments             In 3 months Branch, Alphonse Guild, MD [place] at [place]. [place]   In 3 months Pickard, Cammie Mcgee, MD [place]

## 2022-02-27 NOTE — Telephone Encounter (Signed)
Requested Prescriptions  Pending Prescriptions Disp Refills   LORazepam (ATIVAN) 1 MG tablet [Pharmacy Med Name: LORazepam 1 MG Oral Tablet] 30 tablet 0    Sig: TAKE 1/2 TO 1 (ONE-HALF TO ONE) TABLET BY MOUTH TWICE DAILY AS NEEDED     Not Delegated - Psychiatry: Anxiolytics/Hypnotics 2 Failed - 02/27/2022 12:46 PM      Failed - This refill cannot be delegated      Failed - Urine Drug Screen completed in last 360 days      Failed - Valid encounter within last 6 months    Recent Outpatient Visits           8 months ago Type 2 diabetes mellitus with other specified complication, with long-term current use of insulin ([place])   [place] Pickard, Cammie Mcgee, MD   1 year ago Type 2 diabetes mellitus with other specified complication, with long-term current use of insulin ([place])   [place] Susy Frizzle, MD   2 years ago Type 2 diabetes mellitus with other specified complication, with long-term current use of insulin ([place])   [place] Pickard, Cammie Mcgee, MD   2 years ago Hospital discharge follow-up   [place], Warren T, MD   2 years ago Acute pain of both [place], Cammie Mcgee, MD       Future Appointments             In 3 months Branch, Alphonse Guild, MD [place] at [place]. [place], [place]   In 3 months Pickard, Cammie Mcgee, MD [place], [place] - Patient is not pregnant       EQ ALLERGY RELIEF, CETIRIZINE, 10 MG tablet [Pharmacy Med Name: EQ Allergy Relief (Cetirizine) 10 MG Oral Tablet] 90 tablet 0    Sig: Take 1 tablet by mouth once daily     Ear, Nose, and Throat:  Antihistamines 2 Failed - 02/27/2022 12:46 PM      Failed - Cr in normal range and within 360 days    Creat  Date Value Ref Range Status  01/10/2022 1.47 (H) 0.60 - 0.95 mg/dL Final         Passed - Valid encounter  within last 12 months    Recent Outpatient Visits           8 months ago Type 2 diabetes mellitus with other specified complication, with long-term current use of insulin ([place])   [place] Pickard, Cammie Mcgee, MD   1 year ago Type 2 diabetes mellitus with other specified complication, with long-term current use of insulin ([place])   [place] Susy Frizzle, MD   2 years ago Type 2 diabetes mellitus with other specified complication, with long-term current use of insulin ([place])   [place] Pickard, Cammie Mcgee, MD   2 years ago Hospital discharge follow-up   [place], Warren T, MD   2 years ago Acute pain of both [place], Cammie Mcgee, MD       Future Appointments             In 3 months Branch, Alphonse Guild, MD [place] at [place].  [place]   In 3 months Pickard, Cammie Mcgee, MD [place]

## 2022-02-28 ENCOUNTER — Other Ambulatory Visit: Payer: Self-pay | Admitting: Family Medicine

## 2022-02-28 NOTE — Telephone Encounter (Signed)
Unable to refill per protocol, last refill by provider 02/27/22 for 90. Will refuse duplicate request.  Requested Prescriptions  Pending Prescriptions Disp Refills   cetirizine (ZYRTEC) 10 MG tablet [Pharmacy Med Name: Cetirizine HCl 10 MG Oral Tablet] 90 tablet 0    Sig: Take 1 tablet by mouth once daily     Ear, Nose, and Throat:  Antihistamines 2 Failed - 02/28/2022  6:50 AM      Failed - Cr in normal range and within 360 days    Creat  Date Value Ref Range Status  01/10/2022 1.47 (H) 0.60 - 0.95 mg/dL Final         Passed - Valid encounter within last 12 months    Recent Outpatient Visits           8 months ago Type 2 diabetes mellitus with other specified complication, with long-term current use of insulin ([place])   [place] Pickard, Cammie Mcgee, MD   1 year ago Type 2 diabetes mellitus with other specified complication, with long-term current use of insulin ([place])   [place] Susy Frizzle, MD   2 years ago Type 2 diabetes mellitus with other specified complication, with long-term current use of insulin ([place])   [place] Pickard, Cammie Mcgee, MD   2 years ago Hospital discharge follow-up   [place], Warren T, MD   2 years ago Acute pain of both [place], Cammie Mcgee, MD       Future Appointments             In 3 months Branch, Alphonse Guild, MD [place] at [place]. [place]   In 3 months Pickard, Cammie Mcgee, MD [place]

## 2022-03-01 ENCOUNTER — Other Ambulatory Visit: Payer: Self-pay

## 2022-03-01 ENCOUNTER — Inpatient Hospital Stay (HOSPITAL_COMMUNITY)
Admission: EM | Admit: 2022-03-01 | Discharge: 2022-03-05 | DRG: 291 | Disposition: A | Discharge status: Home Health | Payer: PPO | Attending: Internal Medicine | Admitting: Internal Medicine

## 2022-03-01 ENCOUNTER — Emergency Department (HOSPITAL_COMMUNITY): Payer: PPO

## 2022-03-01 DIAGNOSIS — D649 Anemia, unspecified: Secondary | ICD-10-CM | POA: Diagnosis present

## 2022-03-01 DIAGNOSIS — E1165 Type 2 diabetes mellitus with hyperglycemia: Secondary | ICD-10-CM | POA: Diagnosis present

## 2022-03-01 DIAGNOSIS — K861 Other chronic pancreatitis: Secondary | ICD-10-CM | POA: Diagnosis present

## 2022-03-01 DIAGNOSIS — I2510 Atherosclerotic heart disease of native coronary artery without angina pectoris: Secondary | ICD-10-CM | POA: Diagnosis present

## 2022-03-01 DIAGNOSIS — E785 Hyperlipidemia, unspecified: Secondary | ICD-10-CM | POA: Diagnosis present

## 2022-03-01 DIAGNOSIS — R300 Dysuria: Secondary | ICD-10-CM | POA: Diagnosis present

## 2022-03-01 DIAGNOSIS — N183 Chronic kidney disease, stage 3 unspecified: Secondary | ICD-10-CM | POA: Diagnosis present

## 2022-03-01 DIAGNOSIS — I130 Hypertensive heart and chronic kidney disease with heart failure and stage 1 through stage 4 chronic kidney disease, or unspecified chronic kidney disease: Principal | ICD-10-CM | POA: Diagnosis present

## 2022-03-01 DIAGNOSIS — I509 Heart failure, unspecified: Principal | ICD-10-CM

## 2022-03-01 DIAGNOSIS — Z20822 Contact with and (suspected) exposure to covid-19: Secondary | ICD-10-CM | POA: Diagnosis present

## 2022-03-01 DIAGNOSIS — Z86000 Personal history of in-situ neoplasm of breast: Secondary | ICD-10-CM

## 2022-03-01 DIAGNOSIS — Z7982 Long term (current) use of aspirin: Secondary | ICD-10-CM

## 2022-03-01 DIAGNOSIS — F419 Anxiety disorder, unspecified: Secondary | ICD-10-CM | POA: Diagnosis present

## 2022-03-01 DIAGNOSIS — Z9011 Acquired absence of right breast and nipple: Secondary | ICD-10-CM

## 2022-03-01 DIAGNOSIS — R296 Repeated falls: Secondary | ICD-10-CM | POA: Diagnosis present

## 2022-03-01 DIAGNOSIS — Z79899 Other long term (current) drug therapy: Secondary | ICD-10-CM | POA: Diagnosis not present

## 2022-03-01 DIAGNOSIS — I5031 Acute diastolic (congestive) heart failure: Secondary | ICD-10-CM | POA: Diagnosis present

## 2022-03-01 DIAGNOSIS — I4891 Unspecified atrial fibrillation: Secondary | ICD-10-CM | POA: Diagnosis present

## 2022-03-01 DIAGNOSIS — M8580 Other specified disorders of bone density and structure, unspecified site: Secondary | ICD-10-CM | POA: Diagnosis present

## 2022-03-01 DIAGNOSIS — I340 Nonrheumatic mitral (valve) insufficiency: Secondary | ICD-10-CM | POA: Diagnosis present

## 2022-03-01 DIAGNOSIS — I5032 Chronic diastolic (congestive) heart failure: Secondary | ICD-10-CM | POA: Diagnosis present

## 2022-03-01 DIAGNOSIS — I25810 Atherosclerosis of coronary artery bypass graft(s) without angina pectoris: Secondary | ICD-10-CM | POA: Diagnosis present

## 2022-03-01 DIAGNOSIS — K589 Irritable bowel syndrome without diarrhea: Secondary | ICD-10-CM | POA: Diagnosis present

## 2022-03-01 DIAGNOSIS — E871 Hypo-osmolality and hyponatremia: Secondary | ICD-10-CM | POA: Diagnosis present

## 2022-03-01 DIAGNOSIS — K219 Gastro-esophageal reflux disease without esophagitis: Secondary | ICD-10-CM | POA: Diagnosis present

## 2022-03-01 DIAGNOSIS — Z794 Long term (current) use of insulin: Secondary | ICD-10-CM | POA: Diagnosis not present

## 2022-03-01 DIAGNOSIS — Z885 Allergy status to narcotic agent status: Secondary | ICD-10-CM

## 2022-03-01 DIAGNOSIS — E1122 Type 2 diabetes mellitus with diabetic chronic kidney disease: Secondary | ICD-10-CM | POA: Diagnosis present

## 2022-03-01 DIAGNOSIS — H548 Legal blindness, as defined in USA: Secondary | ICD-10-CM | POA: Diagnosis present

## 2022-03-01 DIAGNOSIS — Z8673 Personal history of transient ischemic attack (TIA), and cerebral infarction without residual deficits: Secondary | ICD-10-CM

## 2022-03-01 DIAGNOSIS — I5033 Acute on chronic diastolic (congestive) heart failure: Secondary | ICD-10-CM | POA: Diagnosis present

## 2022-03-01 DIAGNOSIS — E875 Hyperkalemia: Secondary | ICD-10-CM | POA: Diagnosis present

## 2022-03-01 DIAGNOSIS — Z9049 Acquired absence of other specified parts of digestive tract: Secondary | ICD-10-CM

## 2022-03-01 DIAGNOSIS — Z888 Allergy status to other drugs, medicaments and biological substances status: Secondary | ICD-10-CM

## 2022-03-01 DIAGNOSIS — E1159 Type 2 diabetes mellitus with other circulatory complications: Secondary | ICD-10-CM | POA: Diagnosis present

## 2022-03-01 DIAGNOSIS — Z9181 History of falling: Secondary | ICD-10-CM

## 2022-03-01 DIAGNOSIS — Z8249 Family history of ischemic heart disease and other diseases of the circulatory system: Secondary | ICD-10-CM

## 2022-03-01 DIAGNOSIS — Z951 Presence of aortocoronary bypass graft: Secondary | ICD-10-CM

## 2022-03-01 DIAGNOSIS — I4819 Other persistent atrial fibrillation: Secondary | ICD-10-CM | POA: Insufficient documentation

## 2022-03-01 DIAGNOSIS — I10 Essential (primary) hypertension: Secondary | ICD-10-CM | POA: Diagnosis present

## 2022-03-01 DIAGNOSIS — I482 Chronic atrial fibrillation, unspecified: Secondary | ICD-10-CM | POA: Insufficient documentation

## 2022-03-01 LAB — CBG MONITORING, ED
Glucose-Capillary: 190 mg/dL — ABNORMAL HIGH (ref 70–99)
Glucose-Capillary: 211 mg/dL — ABNORMAL HIGH (ref 70–99)

## 2022-03-01 LAB — CBC WITH DIFFERENTIAL/PLATELET
Abs Immature Granulocytes: 0.04 10*3/uL (ref 0.00–0.07)
Basophils Absolute: 0 10*3/uL (ref 0.0–0.1)
Basophils Relative: 0 %
Eosinophils Absolute: 0 10*3/uL (ref 0.0–0.5)
Eosinophils Relative: 0 %
HCT: 37.7 % (ref 36.0–46.0)
Hemoglobin: 12.6 g/dL (ref 12.0–15.0)
Immature Granulocytes: 0 %
Lymphocytes Relative: 9 %
Lymphs Abs: 0.8 10*3/uL (ref 0.7–4.0)
MCH: 28.4 pg (ref 26.0–34.0)
MCHC: 33.4 g/dL (ref 30.0–36.0)
MCV: 85.1 fL (ref 80.0–100.0)
Monocytes Absolute: 0.4 10*3/uL (ref 0.1–1.0)
Monocytes Relative: 4 %
Neutro Abs: 8 10*3/uL — ABNORMAL HIGH (ref 1.7–7.7)
Neutrophils Relative %: 87 %
Platelets: 203 10*3/uL (ref 150–400)
RBC: 4.43 MIL/uL (ref 3.87–5.11)
RDW: 13.9 % (ref 11.5–15.5)
WBC: 9.2 10*3/uL (ref 4.0–10.5)
nRBC: 0 % (ref 0.0–0.2)

## 2022-03-01 LAB — BASIC METABOLIC PANEL
Anion gap: 12 (ref 5–15)
BUN: 16 mg/dL (ref 8–23)
CO2: 25 mmol/L (ref 22–32)
Calcium: 9.1 mg/dL (ref 8.9–10.3)
Chloride: 88 mmol/L — ABNORMAL LOW (ref 98–111)
Creatinine, Ser: 0.86 mg/dL (ref 0.44–1.00)
GFR, Estimated: >60 mL/min (ref >60)
Glucose, Bld: 182 mg/dL — ABNORMAL HIGH (ref 70–99)
Potassium: 3.9 mmol/L (ref 3.5–5.1)
Sodium: 125 mmol/L — ABNORMAL LOW (ref 135–145)

## 2022-03-01 LAB — URINALYSIS, ROUTINE W REFLEX MICROSCOPIC
Bacteria, UA: NONE SEEN
Bilirubin Urine: NEGATIVE
Glucose, UA: 50 mg/dL — AB
Ketones, ur: NEGATIVE mg/dL
Leukocytes,Ua: NEGATIVE
Nitrite: NEGATIVE
Protein, ur: 30 mg/dL — AB
Specific Gravity, Urine: 1.006 (ref 1.005–1.030)
pH: 6 (ref 5.0–8.0)

## 2022-03-01 LAB — MAGNESIUM: Magnesium: 2 mg/dL (ref 1.7–2.4)

## 2022-03-01 LAB — TSH: TSH: 0.673 u[IU]/mL (ref 0.350–4.500)

## 2022-03-01 LAB — RESP PANEL BY RT-PCR (RSV, FLU A&B, COVID)  RVPGX2
Influenza A by PCR: NEGATIVE
Influenza B by PCR: NEGATIVE
Resp Syncytial Virus by PCR: NEGATIVE
SARS Coronavirus 2 by RT PCR: NEGATIVE

## 2022-03-01 LAB — TROPONIN I (HIGH SENSITIVITY)
Troponin I (High Sensitivity): 10 ng/L (ref <18)
Troponin I (High Sensitivity): 12 ng/L (ref <18)

## 2022-03-01 LAB — BRAIN NATRIURETIC PEPTIDE: B Natriuretic Peptide: 676 pg/mL — ABNORMAL HIGH (ref 0.0–100.0)

## 2022-03-01 MED ORDER — PANCRELIPASE (LIP-PROT-AMYL) 12000-38000 UNITS PO CPEP
48000.0000 [IU] | ORAL_CAPSULE | Freq: Three times a day (TID) | ORAL | Status: DC
  Administered 2022-03-02 – 2022-03-05 (×10): 48000 [IU] via ORAL
  Filled 2022-03-01 (×11): qty 4

## 2022-03-01 MED ORDER — FUROSEMIDE 10 MG/ML IJ SOLN
40.0000 mg | Freq: Once | INTRAMUSCULAR | Status: AC
  Administered 2022-03-01: 40 mg via INTRAVENOUS
  Filled 2022-03-01: qty 4

## 2022-03-01 MED ORDER — INSULIN ASPART 100 UNIT/ML IJ SOLN
0.0000 [IU] | Freq: Three times a day (TID) | INTRAMUSCULAR | Status: DC
  Administered 2022-03-02 (×2): 3 [IU] via SUBCUTANEOUS
  Administered 2022-03-02: 8 [IU] via SUBCUTANEOUS
  Administered 2022-03-03: 5 [IU] via SUBCUTANEOUS
  Administered 2022-03-03: 3 [IU] via SUBCUTANEOUS
  Administered 2022-03-03 – 2022-03-04 (×2): 5 [IU] via SUBCUTANEOUS
  Administered 2022-03-04: 8 [IU] via SUBCUTANEOUS
  Administered 2022-03-04: 2 [IU] via SUBCUTANEOUS
  Administered 2022-03-05: 3 [IU] via SUBCUTANEOUS

## 2022-03-01 MED ORDER — ASPIRIN 81 MG PO TBEC
81.0000 mg | DELAYED_RELEASE_TABLET | Freq: Every day | ORAL | Status: DC
  Administered 2022-03-01 – 2022-03-03 (×3): 81 mg via ORAL
  Filled 2022-03-01 (×3): qty 1

## 2022-03-01 MED ORDER — PANCRELIPASE (LIP-PROT-AMYL) 24000-76000 UNITS PO CPEP: 2.0000 | ORAL_CAPSULE | Freq: Three times a day (TID) | ORAL | Status: DC

## 2022-03-01 MED ORDER — INSULIN ASPART 100 UNIT/ML IJ SOLN: 0.0000 [IU] | Freq: Every day | INTRAMUSCULAR | Status: DC

## 2022-03-01 MED ORDER — LORAZEPAM 0.5 MG PO TABS
0.5000 mg | ORAL_TABLET | Freq: Two times a day (BID) | ORAL | Status: DC | PRN
  Administered 2022-03-02 – 2022-03-03 (×2): 0.5 mg via ORAL
  Filled 2022-03-01 (×2): qty 1

## 2022-03-01 MED ORDER — GABAPENTIN 100 MG PO CAPS: 100.0000 mg | ORAL_CAPSULE | Freq: Three times a day (TID) | ORAL | Status: DC | PRN

## 2022-03-01 MED ORDER — FLUOXETINE HCL 20 MG PO CAPS
20.0000 mg | ORAL_CAPSULE | Freq: Every day | ORAL | Status: DC
  Administered 2022-03-01 – 2022-03-05 (×5): 20 mg via ORAL
  Filled 2022-03-01 (×5): qty 1

## 2022-03-01 MED ORDER — METOPROLOL TARTRATE 5 MG/5ML IV SOLN
5.0000 mg | Freq: Once | INTRAVENOUS | Status: AC
  Administered 2022-03-01: 5 mg via INTRAVENOUS
  Filled 2022-03-01: qty 5

## 2022-03-01 MED ORDER — FUROSEMIDE 10 MG/ML IJ SOLN: 40.0000 mg | Freq: Two times a day (BID) | INTRAMUSCULAR | Status: DC

## 2022-03-01 MED ORDER — ONDANSETRON HCL 4 MG PO TABS: 4.0000 mg | ORAL_TABLET | Freq: Four times a day (QID) | ORAL | Status: DC | PRN

## 2022-03-01 MED ORDER — ONDANSETRON HCL 4 MG/2ML IJ SOLN
4.0000 mg | Freq: Four times a day (QID) | INTRAMUSCULAR | Status: DC | PRN
  Administered 2022-03-01 – 2022-03-03 (×4): 4 mg via INTRAVENOUS
  Filled 2022-03-01 (×4): qty 2

## 2022-03-01 MED ORDER — METOPROLOL TARTRATE 25 MG PO TABS
25.0000 mg | ORAL_TABLET | Freq: Two times a day (BID) | ORAL | Status: DC
  Administered 2022-03-02 (×2): 25 mg via ORAL
  Filled 2022-03-01 (×3): qty 1

## 2022-03-01 MED ORDER — ENOXAPARIN SODIUM 80 MG/0.8ML IJ SOSY
70.0000 mg | PREFILLED_SYRINGE | Freq: Two times a day (BID) | INTRAMUSCULAR | Status: DC
  Administered 2022-03-01 – 2022-03-03 (×4): 70 mg via SUBCUTANEOUS
  Filled 2022-03-01 (×4): qty 0.8

## 2022-03-01 MED ORDER — LORAZEPAM 1 MG PO TABS: 1.0000 mg | ORAL_TABLET | ORAL | Status: DC

## 2022-03-01 MED ORDER — ROSUVASTATIN CALCIUM 10 MG PO TABS
5.0000 mg | ORAL_TABLET | Freq: Every day | ORAL | Status: DC
  Administered 2022-03-01 – 2022-03-05 (×5): 5 mg via ORAL
  Filled 2022-03-01 (×5): qty 1

## 2022-03-01 MED ORDER — PANTOPRAZOLE SODIUM 40 MG PO TBEC
40.0000 mg | DELAYED_RELEASE_TABLET | Freq: Every day | ORAL | Status: DC
  Administered 2022-03-02 – 2022-03-05 (×4): 40 mg via ORAL
  Filled 2022-03-01 (×5): qty 1

## 2022-03-01 MED ORDER — POTASSIUM CHLORIDE CRYS ER 20 MEQ PO TBCR
40.0000 meq | EXTENDED_RELEASE_TABLET | Freq: Two times a day (BID) | ORAL | Status: DC
  Administered 2022-03-02 – 2022-03-03 (×4): 40 meq via ORAL
  Filled 2022-03-01 (×4): qty 2

## 2022-03-01 MED ORDER — INSULIN GLARGINE-YFGN 100 UNIT/ML ~~LOC~~ SOLN
40.0000 [IU] | Freq: Every day | SUBCUTANEOUS | Status: DC
  Administered 2022-03-01: 40 [IU] via SUBCUTANEOUS
  Filled 2022-03-01: qty 0.4
  Filled 2022-03-01: qty 10
  Filled 2022-03-01: qty 0.4

## 2022-03-01 NOTE — Progress Notes (Addendum)
ANTICOAGULATION CONSULT NOTE  Pharmacy Consult for enoxaparin Indication: atrial fibrillation  Allergies  Allergen Reactions   Calcium-Containing Compounds Nausea Only   Morphine And Related Other (See Comments)    Hallucination   Raloxifene Itching    Evista- Face and eyes burning   Vitamin D Analogs Nausea Only    Patient Measurements: Height: '5\' 3"'$  (160 cm) Weight: 72.1 kg (159 lb) IBW/kg (Calculated) : 52.4 Heparin Dosing Weight: 72kg  Vital Signs: Temp: 97.5 F (36.4 C) (12/16 1613) Temp Source: Oral (12/16 1613) BP: 171/120 (12/16 1730) Pulse Rate: 97 (12/16 1730)  Labs: Recent Labs    03/01/22 1625  HGB 12.6  HCT 37.7  PLT 203  CREATININE 0.86  TROPONINIHS 10    Estimated Creatinine Clearance: 38.9 mL/min (by C-G formula based on SCr of 0.86 mg/dL).   Medical History: Past Medical History:  Diagnosis Date   Acute biliary pancreatitis 07/2002   thia was in 07/2004:she still has pseudocyst in tail of pancreas measuring 26 x 33 mm    Adrenal adenoma    bilateral   Anxiety    CAD (coronary artery disease)    a. s/p CABG in 08/2014 with LIMA-LAD, SVG-OM and SVG-PDA   Chronic pancreatitis ([place])    Detached retina    Diabetes mellitus ([place])    Diverticulosis    Heartburn    History of carcinoma in situ of breast 1988   Hyperlipidemia    Hypertension    IBS (irritable bowel syndrome)    Legally blind    Osteoarthritis    Osteopenia    Upper GI bleed September2004   secondary to gastritis      Assessment: 79 yoF admitted with SOB found to have atrial fibrillation. Pharmacy to dose enoxaparin. No AC noted prior to admit. CBC wnl.  Goal of Therapy:  Anti-Xa level 0.6-1 units/ml 4hrs after LMWH dose given Monitor platelets by anticoagulation protocol: Yes   Plan:  -Enoxaparin '1mg'$ /kg ('70mg'$ ) q12h   Arrie Senate, PharmD, BCPS, [facility] Clinical Pharmacist [phone] Please check AMION for all [facility] Pharmacy numbers 03/01/2022

## 2022-03-01 NOTE — H&P (Signed)
History and Physical    Patient: Misty Edwards DOB: Aug 04, 1926 DOA: 03/01/2022 DOS: the patient was seen and examined on 03/01/2022 PCP: Susy Frizzle, MD  Patient coming from: Home  Chief Complaint:  Chief Complaint  Patient presents with   Shortness of Breath   HPI: Misty Edwards is a 88 y.o. female with medical history significant of coronary artery disease, diabetes, heart failure with preserved EF, hypertension, hyperlipidemia, osteopenia.  Patient seen for worsening shortness of breath for the past few days with increasing difficulty with ambulation and exertion.  Symptoms improved somewhat with rest, although she continued to have significant amount of shortness of breath.  Review of Systems: As mentioned in the history of present illness. All other systems reviewed and are negative. Past Medical History:  Diagnosis Date   Acute biliary pancreatitis 07/2002   thia was in 07/2004:she still has pseudocyst in tail of pancreas measuring 54 x 33 mm    Adrenal adenoma    bilateral   Anxiety    CAD (coronary artery disease)    a. s/p CABG in 08/2014 with LIMA-LAD, SVG-OM and SVG-PDA   Chronic pancreatitis ([place])    Detached retina    Diabetes mellitus ([place])    Diverticulosis    Heartburn    History of carcinoma in situ of breast 1988   Hyperlipidemia    Hypertension    IBS (irritable bowel syndrome)    Legally blind    Osteoarthritis    Osteopenia    Upper GI bleed September2004   secondary to gastritis   Past Surgical History:  Procedure Laterality Date   CARDIAC CATHETERIZATION N/A 08/24/2014   Procedure: Left Heart Cath and Coronary Angiography;  Surgeon: Leonie Man, MD;  Location: [facility];  Service: Cardiovascular;  Laterality: N/A;   COLONOSCOPY  08/15/05   few tiny diverticula at sigmoid colon/external hemorrhoids but no polyps   COLONOSCOPY  01/08/2012   GYF:VCBSWHQ diverticulosis. Next colonoscopy in 12/2016   CORONARY ARTERY  BYPASS GRAFT N/A 08/24/2014   Procedure: CORONARY ARTERY BYPASS GRAFTING (CABG) x three, using left internal mammary artery and right leg greater saphenous vein harvested endoscopically;  Surgeon: Ivin Poot, MD;  Location: [facility];  Service: Open Heart Surgery;  Laterality: N/A;   ECTOPIC PREGNANCY SURGERY  1950's   ERCP with sphincterotomy  07/2002   ESOPHAGOGASTRODUODENOSCOPY      Gastritis of body.  Otherwise normal   ESOPHAGOGASTRODUODENOSCOPY  01/08/2012   RMR: Few scattered gastric erosions of uncertain significance-status post biopsy. Minimal chronic inflammation, no H.pylori   LAPAROSCOPIC CHOLECYSTECTOMY     MASTECTOMY Right 1980   PANCREATIC PSEUDOCYST DRAINAGE  [id number]   drained percutaneously    right mastectomy     tacking up of her bladder     TEE WITHOUT CARDIOVERSION N/A 08/24/2014   Procedure: TRANSESOPHAGEAL ECHOCARDIOGRAM (TEE);  Surgeon: Ivin Poot, MD;  Location: [facility];  Service: Open Heart Surgery;  Laterality: N/A;   Social History:  reports that she has never smoked. She has never used smokeless tobacco. She reports that she does not drink alcohol and does not use drugs.  Allergies  Allergen Reactions   Calcium-Containing Compounds Nausea Only   Morphine And Related Other (See Comments)    Hallucination   Raloxifene Itching    Evista- Face and eyes burning   Vitamin D Analogs Nausea Only    Family History  Problem Relation Age of Onset   Ovarian cancer Mother  passed   Colon cancer Father 16       passed away in his 62's   Colon cancer Brother 60       surgery inhis 52's doing well now   Heart attack Brother        passed away age 86   Pancreatitis Brother    Kidney disease Daughter    Leukemia Son     Prior to Admission medications   Medication Sig Start Date End Date Taking? Authorizing Provider  amLODipine (NORVASC) 5 MG tablet TAKE 1 TABLET BY MOUTH ONCE DAILY . APPOINTMENT REQUIRED FOR FUTURE REFILLS Patient taking differently:  Take 5 mg by mouth daily. 02/27/22  Yes BranchAlphonse Guild, MD  aspirin EC 81 MG EC tablet Take 1 tablet (81 mg total) by mouth daily. 08/31/14  Yes Gold, Wayne E, PA-C  calcium carbonate (TUMS - DOSED IN MG ELEMENTAL CALCIUM) 500 MG chewable tablet Chew 3 tablets by mouth at bedtime.   Yes [provider]  CREON 24000-76000 units CPEP TAKE 2 CAPSULES BY MOUTH WITH MEALS AND 1 CAP WITH SNACKS x 2 06/26/20  Yes Pickard, Cammie Mcgee, MD  EQ ALLERGY RELIEF, CETIRIZINE, 10 MG tablet Take 1 tablet by mouth once daily 02/27/22  Yes Susy Frizzle, MD  ferrous sulfate 325 (65 FE) MG tablet Take 325 mg by mouth daily with breakfast.   Yes [provider]  FLUoxetine (PROZAC) 20 MG capsule Take 1 capsule (20 mg total) by mouth daily. 07/05/21  Yes Susy Frizzle, MD  furosemide (LASIX) 20 MG tablet Take 1 tablet by mouth once daily 12/17/21  Yes Pickard, Cammie Mcgee, MD  gabapentin (NEURONTIN) 100 MG capsule TAKE 1 CAPSULE BY MOUTH THREE TIMES DAILY AS NEEDED FOR NERVE PAIN Patient taking differently: Take 100 mg by mouth 3 (three) times daily as needed (nerve pain). 01/30/22  Yes Susy Frizzle, MD  insulin NPH-regular Human (NOVOLIN 70/30 RELION) (70-30) 100 UNIT/ML injection INJECT 25 UNITS SUBCUTANEOUSLY IN THE MORNING AND 15 IN THE EVENING Patient taking differently: Inject into the skin See admin instructions. INJECT 42 UNITS SUBCUTANEOUSLY IN THE MORNING AND 22 IN THE EVENING 09/28/19  Yes Pickard, Cammie Mcgee, MD  LORazepam (ATIVAN) 1 MG tablet TAKE 1/2 TO 1 (ONE-HALF TO ONE) TABLET BY MOUTH TWICE DAILY AS NEEDED Patient taking differently: Take 1 mg by mouth See admin instructions. TAKE 1/2 TO 1 (ONE-HALF TO ONE) TABLET BY MOUTH TWICE DAILY AS NEEDED 02/28/22  Yes Pickard, Cammie Mcgee, MD  MAGNESIUM-OXIDE 400 (240 Mg) MG tablet TAKE 1 TABLET BY MOUTH ONCE DAILY . APPOINTMENT REQUIRED FOR FUTURE REFILLS Patient taking differently: Take 400 mg by mouth daily. 12/23/21  Yes BranchAlphonse Guild,  MD  metoprolol tartrate (LOPRESSOR) 25 MG tablet Take 1 tablet by mouth twice daily 01/14/22  Yes Branch, Alphonse Guild, MD  Multiple Vitamin (MULTIVITAMIN) capsule Take 1 capsule by mouth every morning.    Yes [provider]  pantoprazole (PROTONIX) 40 MG tablet Take 1 tablet by mouth once daily 11/29/21  Yes Branch, Alphonse Guild, MD  potassium chloride (KLOR-CON) 10 MEQ tablet TAKE 1 TABLET BY MOUTH ONCE DAILY AS NEEDED (TAKE WITH FUROSEMIDE) Patient taking differently: Take 10 mEq by mouth See admin instructions. TAKE 1 TABLET BY MOUTH ONCE DAILY AS NEEDED (TAKE  WITH  FUROSEMIDE) 04/18/21  Yes Susy Frizzle, MD  rosuvastatin (CRESTOR) 5 MG tablet Take 1 tablet by mouth once daily 12/16/21  Yes Branch, Alphonse Guild, MD  triamcinolone  cream (KENALOG) 0.1 % Apply topically 2 (two) times daily. 01/10/22  Yes Susy Frizzle, MD  BD INSULIN SYRINGE U/F 31G X 5/16" 0.5 ML MISC USE AS DIRECTED TWICE DAILY 02/17/22   Susy Frizzle, MD  Blood Glucose Monitoring Suppl (BLOOD GLUCOSE SYSTEM PAK) KIT Please dispense as One Touch Ultra. Use as directed to monitor FSBS 4x daily. Dx: E11.65 11/16/20   Susy Frizzle, MD  Lancets Misc. MISC Pt has Accu Chek Aviva Plus - Needs just lancets  Checks BS 4-5 times per day. Disp 3 boxes(90 day Supply)/4 refills Dx code. E11.9 10/13/19   Susy Frizzle, MD  OneTouch Delica Lancets 62B MISC CHECK BLOOD SUGAR 4-5 TIMES PER DAY 10/22/20   Susy Frizzle, MD  [facility] ULTRA test strip USE 1 STRIP TO CHECK GLUCOSE FIVE TIMES DAILY 01/28/22   Susy Frizzle, MD    Physical Exam: Vitals:   03/01/22 1700 03/01/22 1730 03/01/22 1800 03/01/22 1900  BP: (!) 181/88 (!) 171/120 (!) 164/91 (!) 166/74  Pulse: (!) 106 97 91 90  Resp: (!) 30 (!) 34 (!) 29 (!) 33  Temp:      TempSrc:      SpO2: 94% 96% 96% 99%  Weight:      Height:       General: Elderly female. Awake and alert and oriented x3. No acute cardiopulmonary distress.  HEENT: Normocephalic  atraumatic.  Right and left ears normal in appearance.  Pupils equal, round, reactive to light. Extraocular muscles are intact. Sclerae anicteric and noninjected.  Moist mucosal membranes. No mucosal lesions.  Neck: Neck supple without lymphadenopathy. No carotid bruits. No masses palpated.  Cardiovascular: Irreg rate. No murmurs, rubs, gallops auscultated. Increased JVD.  Respiratory: rales in bases bilaterally. No accessory muscle use. Abdomen: Soft, nontender, nondistended. Active bowel sounds. No masses or hepatosplenomegaly  Skin: No rashes, lesions, or ulcerations.  Dry, warm to touch. 2+ dorsalis pedis and radial pulses. Musculoskeletal: No calf or leg pain. All major joints not erythematous nontender.  No upper or lower joint deformation.  Good ROM.  No contractures  Psychiatric: Intact judgment and insight. Pleasant and cooperative. Neurologic: No focal neurological deficits. Strength is 5/5 and symmetric in upper and lower extremities.  Cranial nerves II through XII are grossly intact.  Data Reviewed: Results for orders placed or performed during the hospital encounter of 03/01/22 (from the past 24 hour(s))  Resp panel by RT-PCR (RSV, Flu A&B, Covid) Anterior Nasal Swab     Status: None   Collection Time: 03/01/22  3:10 PM   Specimen: Anterior Nasal Swab  Result Value Ref Range   SARS Coronavirus 2 by RT PCR NEGATIVE NEGATIVE   Influenza A by PCR NEGATIVE NEGATIVE   Influenza B by PCR NEGATIVE NEGATIVE   Resp Syncytial Virus by PCR NEGATIVE NEGATIVE  Urinalysis, Routine w reflex microscopic Urine, Clean Catch     Status: Abnormal   Collection Time: 03/01/22  3:30 PM  Result Value Ref Range   Color, Urine STRAW (A) YELLOW   APPearance CLEAR CLEAR   Specific Gravity, Urine 1.006 1.005 - 1.030   pH 6.0 5.0 - 8.0   Glucose, UA 50 (A) NEGATIVE mg/dL   Hgb urine dipstick SMALL (A) NEGATIVE   Bilirubin Urine NEGATIVE NEGATIVE   Ketones, ur NEGATIVE NEGATIVE mg/dL   Protein, ur 30  (A) NEGATIVE mg/dL   Nitrite NEGATIVE NEGATIVE   Leukocytes,Ua NEGATIVE NEGATIVE   RBC / HPF 0-5 0 - 5 RBC/hpf  WBC, UA 0-5 0 - 5 WBC/hpf   Bacteria, UA NONE SEEN NONE SEEN  Basic metabolic panel     Status: Abnormal   Collection Time: 03/01/22  4:25 PM  Result Value Ref Range   Sodium 125 (L) 135 - 145 mmol/L   Potassium 3.9 3.5 - 5.1 mmol/L   Chloride 88 (L) 98 - 111 mmol/L   CO2 25 22 - 32 mmol/L   Glucose, Bld 182 (H) 70 - 99 mg/dL   BUN 16 8 - 23 mg/dL   Creatinine, Ser 0.86 0.44 - 1.00 mg/dL   Calcium 9.1 8.9 - 10.3 mg/dL   GFR, Estimated >60 >60 mL/min   Anion gap 12 5 - 15  Brain natriuretic peptide     Status: Abnormal   Collection Time: 03/01/22  4:25 PM  Result Value Ref Range   B Natriuretic Peptide 676.0 (H) 0.0 - 100.0 pg/mL  CBC with Differential     Status: Abnormal   Collection Time: 03/01/22  4:25 PM  Result Value Ref Range   WBC 9.2 4.0 - 10.5 K/uL   RBC 4.43 3.87 - 5.11 MIL/uL   Hemoglobin 12.6 12.0 - 15.0 g/dL   HCT 37.7 36.0 - 46.0 %   MCV 85.1 80.0 - 100.0 fL   MCH 28.4 26.0 - 34.0 pg   MCHC 33.4 30.0 - 36.0 g/dL   RDW 13.9 11.5 - 15.5 %   Platelets 203 150 - 400 K/uL   nRBC 0.0 0.0 - 0.2 %   Neutrophils Relative % 87 %   Neutro Abs 8.0 (H) 1.7 - 7.7 K/uL   Lymphocytes Relative 9 %   Lymphs Abs 0.8 0.7 - 4.0 K/uL   Monocytes Relative 4 %   Monocytes Absolute 0.4 0.1 - 1.0 K/uL   Eosinophils Relative 0 %   Eosinophils Absolute 0.0 0.0 - 0.5 K/uL   Basophils Relative 0 %   Basophils Absolute 0.0 0.0 - 0.1 K/uL   Immature Granulocytes 0 %   Abs Immature Granulocytes 0.04 0.00 - 0.07 K/uL  Troponin I (High Sensitivity)     Status: None   Collection Time: 03/01/22  4:25 PM  Result Value Ref Range   Troponin I (High Sensitivity) 10 <18 ng/L  Troponin I (High Sensitivity)     Status: None   Collection Time: 03/01/22  6:28 PM  Result Value Ref Range   Troponin I (High Sensitivity) 12 <18 ng/L  Magnesium     Status: None   Collection Time:  03/01/22  6:28 PM  Result Value Ref Range   Magnesium 2.0 1.7 - 2.4 mg/dL   DG Chest Port 1 View  Result Date: 03/01/2022 CLINICAL DATA:  Shortness of breath EXAM: PORTABLE CHEST 1 VIEW COMPARISON:  09/25/2019 FINDINGS: Enlargement of the cardiac silhouette, increased from prior. Prior sternotomy and CABG. Diffusely prominent interstitial markings bilaterally with streaky bibasilar opacities. Possible trace pleural effusions. No pneumothorax. IMPRESSION: 1. Findings suggestive of CHF with pulmonary edema. 2. Enlargement of the cardiac silhouette, increased from prior. Underlying pericardial effusion not excluded. Electronically Signed   By: Davina Poke D.O.   On: 03/01/2022 15:40     Assessment and Plan: No notes have been filed under this hospital service. Service: Hospitalist  Principal Problem:   Acute exacerbation of CHF (congestive heart failure) (HCC) Active Problems:   Type 2 diabetes mellitus with vascular disease (HCC)   Essential hypertension   GERD   S/P CABG x 3   Acute on chronic diastolic  CHF (congestive heart failure) (HCC)   Chronic atrial fibrillation with RVR (HCC)  Acute CHF Telemetry monitoring Strict I/O Daily Weights Diuresis: Lasix Potassium: 40 mEq twice a day by mouth Echo cardiac exam tomorrow Repeat BMP tomorrow New onset A fib with RVR Currently rate controlled Echo Continue metoprolol Start lovenox CAD with h/o CABG Continue ASA HTN Continue antihypertensives DM2\ Semglee and SSI    Advance Care Planning:   Code Status: Full Code   Consults: None  Family Communication:   Severity of Illness: The appropriate patient status for this patient is INPATIENT. Inpatient status is judged to be reasonable and necessary in order to provide the required intensity of service to ensure the patient's safety. The patient's presenting symptoms, physical exam findings, and initial radiographic and laboratory data in the context of their chronic  comorbidities is felt to place them at high risk for further clinical deterioration. Furthermore, it is not anticipated that the patient will be medically stable for discharge from the hospital within 2 midnights of admission.   * I certify that at the point of admission it is my clinical judgment that the patient will require inpatient hospital care spanning beyond 2 midnights from the point of admission due to high intensity of service, high risk for further deterioration and high frequency of surveillance required.*  Author: Truett Mainland, DO 03/01/2022 7:38 PM  For on call review [email or web address].

## 2022-03-01 NOTE — ED Notes (Signed)
Pt and family requested not to administer novolog insulin due to "sugar episodes" that happen overnight which pt family states causes hypoglycemia.

## 2022-03-01 NOTE — ED Provider Notes (Signed)
[facility] EMERGENCY DEPARTMENT Provider Note   CSN: [id number] Arrival date & time: 03/01/22  1456     History  Chief Complaint  Patient presents with   Shortness of Misty Edwards is a 88 y.o. female.  Pt is a 88 yo female with a pmhx significant for htn, dm2, hld, ugi bleed, chf and cad.  Pt has been sob for the past few days.  She has a cough.  No fever.  No known sick contacts.  Pt also complains of dysuria.  EMS report RA O2 sat at 90%, so she was put on 2L oxygen.       Home Medications Prior to Admission medications   Medication Sig Start Date End Date Taking? Authorizing Provider  amLODipine (NORVASC) 5 MG tablet TAKE 1 TABLET BY MOUTH ONCE DAILY . APPOINTMENT REQUIRED FOR FUTURE REFILLS Patient taking differently: Take 5 mg by mouth daily. 02/27/22  Yes BranchAlphonse Guild, MD  aspirin EC 81 MG EC tablet Take 1 tablet (81 mg total) by mouth daily. 08/31/14  Yes Gold, Wayne E, PA-C  calcium carbonate (TUMS - DOSED IN MG ELEMENTAL CALCIUM) 500 MG chewable tablet Chew 3 tablets by mouth at bedtime.   Yes [provider]  CREON 24000-76000 units CPEP TAKE 2 CAPSULES BY MOUTH WITH MEALS AND 1 CAP WITH SNACKS x 2 06/26/20  Yes Pickard, Cammie Mcgee, MD  EQ ALLERGY RELIEF, CETIRIZINE, 10 MG tablet Take 1 tablet by mouth once daily 02/27/22  Yes Susy Frizzle, MD  ferrous sulfate 325 (65 FE) MG tablet Take 325 mg by mouth daily with breakfast.   Yes [provider]  FLUoxetine (PROZAC) 20 MG capsule Take 1 capsule (20 mg total) by mouth daily. 07/05/21  Yes Susy Frizzle, MD  furosemide (LASIX) 20 MG tablet Take 1 tablet by mouth once daily 12/17/21  Yes Pickard, Cammie Mcgee, MD  gabapentin (NEURONTIN) 100 MG capsule TAKE 1 CAPSULE BY MOUTH THREE TIMES DAILY AS NEEDED FOR NERVE PAIN Patient taking differently: Take 100 mg by mouth 3 (three) times daily as needed (nerve pain). 01/30/22  Yes Susy Frizzle, MD  insulin NPH-regular Human (NOVOLIN  70/30 RELION) (70-30) 100 UNIT/ML injection INJECT 25 UNITS SUBCUTANEOUSLY IN THE MORNING AND 15 IN THE EVENING Patient taking differently: Inject into the skin See admin instructions. INJECT 42 UNITS SUBCUTANEOUSLY IN THE MORNING AND 22 IN THE EVENING 09/28/19  Yes Pickard, Cammie Mcgee, MD  LORazepam (ATIVAN) 1 MG tablet TAKE 1/2 TO 1 (ONE-HALF TO ONE) TABLET BY MOUTH TWICE DAILY AS NEEDED Patient taking differently: Take 1 mg by mouth See admin instructions. TAKE 1/2 TO 1 (ONE-HALF TO ONE) TABLET BY MOUTH TWICE DAILY AS NEEDED 02/28/22  Yes Pickard, Cammie Mcgee, MD  MAGNESIUM-OXIDE 400 (240 Mg) MG tablet TAKE 1 TABLET BY MOUTH ONCE DAILY . APPOINTMENT REQUIRED FOR FUTURE REFILLS Patient taking differently: Take 400 mg by mouth daily. 12/23/21  Yes BranchAlphonse Guild, MD  metoprolol tartrate (LOPRESSOR) 25 MG tablet Take 1 tablet by mouth twice daily 01/14/22  Yes Branch, Alphonse Guild, MD  Multiple Vitamin (MULTIVITAMIN) capsule Take 1 capsule by mouth every morning.    Yes [provider]  pantoprazole (PROTONIX) 40 MG tablet Take 1 tablet by mouth once daily 11/29/21  Yes Branch, Alphonse Guild, MD  potassium chloride (KLOR-CON) 10 MEQ tablet TAKE 1 TABLET BY MOUTH ONCE DAILY AS NEEDED (TAKE WITH FUROSEMIDE) Patient taking differently: Take 10 mEq by mouth See  admin instructions. TAKE 1 TABLET BY MOUTH ONCE DAILY AS NEEDED (TAKE  WITH  FUROSEMIDE) 04/18/21  Yes Susy Frizzle, MD  rosuvastatin (CRESTOR) 5 MG tablet Take 1 tablet by mouth once daily 12/16/21  Yes Branch, Alphonse Guild, MD  triamcinolone cream (KENALOG) 0.1 % Apply topically 2 (two) times daily. 01/10/22  Yes Susy Frizzle, MD  BD INSULIN SYRINGE U/F 31G X 5/16" 0.5 ML MISC USE AS DIRECTED TWICE DAILY 02/17/22   Susy Frizzle, MD  Blood Glucose Monitoring Suppl (BLOOD GLUCOSE SYSTEM PAK) KIT Please dispense as One Touch Ultra. Use as directed to monitor FSBS 4x daily. Dx: E11.65 11/16/20   Susy Frizzle, MD  Lancets Misc. MISC Pt has  Accu Chek Aviva Plus - Needs just lancets  Checks BS 4-5 times per day. Disp 3 boxes(90 day Supply)/4 refills Dx code. E11.9 10/13/19   Susy Frizzle, MD  OneTouch Delica Lancets 03E MISC CHECK BLOOD SUGAR 4-5 TIMES PER DAY 10/22/20   Susy Frizzle, MD  [facility] ULTRA test strip USE 1 STRIP TO CHECK GLUCOSE FIVE TIMES DAILY 01/28/22   Susy Frizzle, MD      Allergies    Calcium-containing compounds, Morphine and related, Raloxifene, and Vitamin d analogs    Review of Systems   Review of Systems  Respiratory:  Positive for cough and shortness of breath.   Genitourinary:  Positive for dysuria.  All other systems reviewed and are negative.   Physical Exam Updated Vital Signs BP (!) 171/120   Pulse 97   Temp (!) 97.5 F (36.4 C) (Oral)   Resp (!) 34   Ht _0  (1.6 m)   Wt 72.1 kg   SpO2 96%   BMI 28.17 kg/m  Physical Exam Vitals and nursing note reviewed.  Constitutional:      Appearance: She is well-developed.  HENT:     Head: Normocephalic and atraumatic.     Mouth/Throat:     Mouth: Mucous membranes are moist.     Pharynx: Oropharynx is clear.  Eyes:     Extraocular Movements: Extraocular movements intact.     Pupils: Pupils are equal, round, and reactive to light.  Cardiovascular:     Rate and Rhythm: Tachycardia present. Rhythm irregular.  Pulmonary:     Effort: Pulmonary effort is normal.     Breath sounds: Rhonchi present.  Abdominal:     General: Bowel sounds are normal.     Palpations: Abdomen is soft.  Musculoskeletal:        General: Normal range of motion.     Cervical back: Normal range of motion and neck supple.     Right lower leg: Edema present.     Left lower leg: Edema present.  Skin:    General: Skin is warm.     Capillary Refill: Capillary refill takes less than 2 seconds.  Neurological:     General: No focal deficit present.     Mental Status: She is alert and oriented to person, place, and time.  Psychiatric:        Mood and  Affect: Mood normal.        Behavior: Behavior normal.     ED Results / Procedures / Treatments   Labs (all labs ordered are listed, but only abnormal results are displayed) Labs Reviewed  BASIC METABOLIC PANEL - Abnormal; Notable for the following components:      Result Value   Sodium 125 (*)    Chloride 88 (*)  Glucose, Bld 182 (*)    All other components within normal limits  BRAIN NATRIURETIC PEPTIDE - Abnormal; Notable for the following components:   B Natriuretic Peptide 676.0 (*)    All other components within normal limits  CBC WITH DIFFERENTIAL/PLATELET - Abnormal; Notable for the following components:   Neutro Abs 8.0 (*)    All other components within normal limits  URINALYSIS, ROUTINE W REFLEX MICROSCOPIC - Abnormal; Notable for the following components:   Color, Urine STRAW (*)    Glucose, UA 50 (*)    Hgb urine dipstick SMALL (*)    Protein, ur 30 (*)    All other components within normal limits  RESP PANEL BY RT-PCR (RSV, FLU A&B, COVID)  [id number]  CULTURE, BLOOD (ROUTINE X 2)  CULTURE, BLOOD (ROUTINE X 2)  TSH  MAGNESIUM  TROPONIN I (HIGH SENSITIVITY)  TROPONIN I (HIGH SENSITIVITY)    EKG EKG Interpretation  Date/Time:  Saturday March 01 2022 15:19:18 EST Ventricular Rate:  100 PR Interval:    QRS Duration: 96 QT Interval:  387 QTC Calculation: 500 R Axis:   84 Text Interpretation: Atrial fibrillation Ventricular premature complex Anterior infarct, old Nonspecific T abnormalities, lateral leads new onset afib Confirmed by Isla Pence [phone]) on 03/01/2022 3:49:29 PM  Radiology DG Chest Port 1 View  Result Date: 03/01/2022 CLINICAL DATA:  Shortness of breath EXAM: PORTABLE CHEST 1 VIEW COMPARISON:  09/25/2019 FINDINGS: Enlargement of the cardiac silhouette, increased from prior. Prior sternotomy and CABG. Diffusely prominent interstitial markings bilaterally with streaky bibasilar opacities. Possible trace pleural effusions. No pneumothorax.  IMPRESSION: 1. Findings suggestive of CHF with pulmonary edema. 2. Enlargement of the cardiac silhouette, increased from prior. Underlying pericardial effusion not excluded. Electronically Signed   By: Davina Poke D.O.   On: 03/01/2022 15:40    Procedures Procedures    Medications Ordered in ED Medications  furosemide (LASIX) injection 40 mg (40 mg Intravenous Given 03/01/22 1719)  metoprolol tartrate (LOPRESSOR) injection 5 mg (5 mg Intravenous Given 03/01/22 1752)    ED Course/ Medical Decision Making/ A&P                           Medical Decision Making Amount and/or Complexity of Data Reviewed Labs: ordered. Radiology: ordered.  Risk Prescription drug management. Decision regarding hospitalization.   This patient presents to the ED for concern of sob, this involves an extensive number of treatment options, and is a complaint that carries with it a high risk of complications and morbidity.  The differential diagnosis includes pna, covid/flu, bronchitis, chf, infection   Co morbidities that complicate the patient evaluation   htn, dm2, hld, ugi bleed, and cad   Additional history obtained:  Additional history obtained from epic chart review External records from outside source obtained and reviewed including EMS report   Lab Tests:  I Ordered, and personally interpreted labs.  The pertinent results include:  UA + glucose+protein and w/o infection; covid/flu/rsv neg, cbc neg, bmp with na 125 (133 1 mo ago); bnp elevated at 676; trop nl   Imaging Studies ordered:  I ordered imaging studies including CXR  I independently visualized and interpreted imaging which showed  . Findings suggestive of CHF with pulmonary edema.  2. Enlargement of the cardiac silhouette, increased from prior.  Underlying pericardial effusion not excluded.   I agree with the radiologist interpretation   Cardiac Monitoring:  The patient was maintained on a cardiac monitor.  I  personally viewed and interpreted the cardiac monitored which showed an underlying rhythm of: afib   Medicines ordered and prescription drug management:  I ordered medication including lasix  for chf  Reevaluation of the patient after these medicines showed that the patient improved I have reviewed the patients home medicines and have made adjustments as needed    Critical Interventions:  lasix   Consultations Obtained:  I requested consultation with the hospitalist (Dr. Nehemiah Settle,  and discussed lab and imaging findings as well as pertinent plan - the will recommend   Problem List / ED Course:  SOB due to a CHF exacerbation.  Pt given lasix.   Hyponatremia:  likely due to volume overload New onset atrial fib.  CHA2DS2/VAS Stroke Risk Points:  63 (age, sex, chf, htn, cad), so she is started on lovenox  Reevaluation:  After the interventions noted above, I reevaluated the patient and found that they have :improved   Social Determinants of Health:  Lives at home   Dispostion:  After consideration of the diagnostic results and the patients response to treatment, I feel that the patent would benefit from admission.  CRITICAL CARE Performed by: Isla Pence   Total critical care time: 30 minutes  Critical care time was exclusive of separately billable procedures and treating other patients.  Critical care was necessary to treat or prevent imminent or life-threatening deterioration.  Critical care was time spent personally by me on the following activities: development of treatment plan with patient and/or surrogate as well as nursing, discussions with consultants, evaluation of patient's response to treatment, examination of patient, obtaining history from patient or surrogate, ordering and performing treatments and interventions, ordering and review of laboratory studies, ordering and review of radiographic studies, pulse oximetry and re-evaluation of patient's  condition.         Final Clinical Impression(s) / ED Diagnoses Final diagnoses:  Acute on chronic congestive heart failure, unspecified heart failure type ([place])  Hyponatremia  New onset atrial fibrillation [facility])    Rx / DC Orders ED Discharge Orders     None         Isla Pence, MD 03/01/22 1810

## 2022-03-01 NOTE — ED Notes (Signed)
2 RN's have attempted IV access with no success. MD made aware.

## 2022-03-01 NOTE — ED Notes (Signed)
Pt began vomiting shortly after medication administration. MD made aware. No new orders given at this time. 4 mg IV zofran given.

## 2022-03-01 NOTE — ED Triage Notes (Signed)
Pt BIB [facility] from home due to [facility], fatigue, weakness, and UTI symptoms that began 2 weeks ago. Pt granddaughter wants her urine checked. EMS states 02 sats 90% on RA.

## 2022-03-01 NOTE — ED Notes (Signed)
AC called to bring semglee

## 2022-03-02 ENCOUNTER — Inpatient Hospital Stay (HOSPITAL_COMMUNITY): Payer: PPO

## 2022-03-02 ENCOUNTER — Encounter (HOSPITAL_COMMUNITY): Payer: Self-pay | Admitting: Family Medicine

## 2022-03-02 DIAGNOSIS — E1159 Type 2 diabetes mellitus with other circulatory complications: Secondary | ICD-10-CM | POA: Diagnosis not present

## 2022-03-02 DIAGNOSIS — E871 Hypo-osmolality and hyponatremia: Secondary | ICD-10-CM

## 2022-03-02 DIAGNOSIS — I5033 Acute on chronic diastolic (congestive) heart failure: Secondary | ICD-10-CM

## 2022-03-02 DIAGNOSIS — I4891 Unspecified atrial fibrillation: Secondary | ICD-10-CM

## 2022-03-02 LAB — CBC
HCT: 35 % — ABNORMAL LOW (ref 36.0–46.0)
Hemoglobin: 11.8 g/dL — ABNORMAL LOW (ref 12.0–15.0)
MCH: 28.7 pg (ref 26.0–34.0)
MCHC: 33.7 g/dL (ref 30.0–36.0)
MCV: 85.2 fL (ref 80.0–100.0)
Platelets: 174 10*3/uL (ref 150–400)
RBC: 4.11 MIL/uL (ref 3.87–5.11)
RDW: 14 % (ref 11.5–15.5)
WBC: 6.8 10*3/uL (ref 4.0–10.5)
nRBC: 0 % (ref 0.0–0.2)

## 2022-03-02 LAB — BASIC METABOLIC PANEL
Anion gap: 12 (ref 5–15)
BUN: 17 mg/dL (ref 8–23)
CO2: 30 mmol/L (ref 22–32)
Calcium: 8.7 mg/dL — ABNORMAL LOW (ref 8.9–10.3)
Chloride: 88 mmol/L — ABNORMAL LOW (ref 98–111)
Creatinine, Ser: 0.88 mg/dL (ref 0.44–1.00)
GFR, Estimated: >60 mL/min (ref >60)
Glucose, Bld: 190 mg/dL — ABNORMAL HIGH (ref 70–99)
Potassium: 3.7 mmol/L (ref 3.5–5.1)
Sodium: 130 mmol/L — ABNORMAL LOW (ref 135–145)

## 2022-03-02 LAB — GLUCOSE, CAPILLARY
Glucose-Capillary: 234 mg/dL — ABNORMAL HIGH (ref 70–99)
Glucose-Capillary: 185 mg/dL — ABNORMAL HIGH (ref 70–99)
Glucose-Capillary: 290 mg/dL — ABNORMAL HIGH (ref 70–99)
Glucose-Capillary: 169 mg/dL — ABNORMAL HIGH (ref 70–99)
Glucose-Capillary: 198 mg/dL — ABNORMAL HIGH (ref 70–99)

## 2022-03-02 LAB — ECHOCARDIOGRAM COMPLETE
Area-P 1/2: 3.99 cm2
Height: 63 in
MV M vel: 4.57 m/s
MV Peak grad: 83.5 mmHg
MV VTI: 2.41 cm2
Radius: 0.4 cm
S' Lateral: 2.6 cm
Weight: 2476.21 oz

## 2022-03-02 MED ORDER — ALUM & MAG HYDROXIDE-SIMETH 200-200-20 MG/5ML PO SUSP
30.0000 mL | Freq: Four times a day (QID) | ORAL | Status: DC | PRN
  Administered 2022-03-02 – 2022-03-03 (×2): 30 mL via ORAL
  Filled 2022-03-02 (×2): qty 30

## 2022-03-02 MED ORDER — FUROSEMIDE 20 MG PO TABS
20.0000 mg | ORAL_TABLET | Freq: Every day | ORAL | Status: DC
  Filled 2022-03-02: qty 1

## 2022-03-02 MED ORDER — FUROSEMIDE 10 MG/ML IJ SOLN: 20.0000 mg | Freq: Every day | INTRAMUSCULAR | Status: DC

## 2022-03-02 MED ORDER — INSULIN GLARGINE-YFGN 100 UNIT/ML ~~LOC~~ SOLN
10.0000 [IU] | Freq: Two times a day (BID) | SUBCUTANEOUS | Status: DC
  Administered 2022-03-02 – 2022-03-03 (×2): 10 [IU] via SUBCUTANEOUS
  Filled 2022-03-02 (×5): qty 0.1

## 2022-03-02 MED ORDER — ACETAMINOPHEN 325 MG PO TABS
650.0000 mg | ORAL_TABLET | Freq: Four times a day (QID) | ORAL | Status: DC | PRN
  Administered 2022-03-02: 650 mg via ORAL
  Filled 2022-03-02: qty 2

## 2022-03-02 MED ORDER — INSULIN ASPART 100 UNIT/ML IJ SOLN: 3.0000 [IU] | Freq: Three times a day (TID) | INTRAMUSCULAR | Status: DC

## 2022-03-02 NOTE — TOC Progression Note (Signed)
Transition of Care [facility]) - Progression Note    Patient Details  Name: Misty Edwards MRN: [id number] Date of Birth: July 14, 1926  Transition of Care [facility]) CM/SW Contact  Salome Arnt, [place] Phone Number: 03/02/2022, 8:41 AM  Clinical Narrative:   Transition of Care (TOC) Screening Note   Patient Details  Name: Misty Edwards Date of Birth: 1926/10/17   Transition of Care [facility]) CM/SW Contact:    Salome Arnt, LCSW Phone Number: 03/02/2022, 8:41 AM  TOC received consult for CHF screening. Per MD, all related to afib. Will cancel consult. PT evaluation pending. TOC will continue to follow.   Transition of Care Department [facility]) has reviewed patient and no TOC needs have been identified at this time. We will continue to monitor patient advancement through interdisciplinary progression rounds. If new patient transition needs arise, please place a TOC consult.            Expected Discharge Plan and Services                                                 Social Determinants of Health (SDOH) Interventions    Readmission Risk Interventions     No data to display

## 2022-03-02 NOTE — Progress Notes (Signed)
Patient c/o headache pain. 8/10. MD Adesfeo notified. Received order for Tylenol. Medication given. Will continue to monitor.

## 2022-03-02 NOTE — Progress Notes (Signed)
  Echocardiogram 2D Echocardiogram has been performed.  Bobbye Charleston 03/02/2022, 11:22 AM

## 2022-03-02 NOTE — Progress Notes (Addendum)
[facility] PROGRESS NOTE    Progress Note  Misty Edwards  RCB:[id number] DOB: September 21, 1926 DOA: 03/01/2022 PCP: Susy Frizzle, MD     Brief Narrative:   Misty Edwards is an 88 y.o. female past medical history significant for coronary artery disease, diabetes mellitus type 2, chronic diastolic heart failure comes in for worsening shortness of breath that started several days prior to admission   Assessment/Plan:    New onset atrial fibrillation with RVR: Currently rate controlled. Continue home metoprolol, started on Lovenox. Consult cardiology tomorrow for possible TEE cardioversion.  Acute on chronic diastolic HF (congestive heart failure) (HCC) Chest x-ray showed pulmonary edema, BNP is 600 cardiac biomarkers have a big basically flat. Monitor strict I's and O's and daily weights. Out of bed to chair, consult physical therapy.  She is negative about 900 cc. Blood pressure has remained relatively stable. Her estimated dry weight is around 67 to 69 kg. She appears euvolemic on physical exam, these findings can probably be due to A-fib with RVR. Switch her back to her home dose of Lasix. Blood cultures were ordered.  Hypervolemic hyponatremia: Improved with rate control.  Normocytic anemia: She relates no signs of bleeding continue aspirin, she is currently on Lovenox.  Type 2 diabetes mellitus with vascular disease ([place]) She was started on long-acting insulin blood glucose 182 230. Will add sliding scale insulin. Check an A1c  Essential hypertension Blood pressure was significantly elevated on admission 174/62, this morning 148/71. She was continued on Lasix and metoprolol, Norvasc was held. Continue to monitor closely.  GERD Continue Protonix.  S/P CABG x 3 Continue aspirin.   DVT prophylaxis: lovenox Family Communication:none Status is: Inpatient Remains inpatient appropriate because: Acute preserve heart failure    Code Status:      Code Status Orders  (From admission, onward)           Start     Ordered   03/01/22 1934  Full code  Continuous       Question:  By:  Answer:  Consent: discussion documented in EHR   03/01/22 1938           Code Status History     Date Active Date Inactive Code Status Order ID Comments User Context   09/23/2019 2311 09/26/2019 1944 Full Code [id number]  Albertine Patricia, MD Inpatient   09/13/2018 1408 09/23/2018 1729 Full Code [id number]  Murlean Iba, MD Inpatient   09/03/2018 1109 09/06/2018 2122 Full Code [id number]  Lavina Hamman, MD Inpatient   10/23/2016 1642 10/24/2016 2021 Full Code [id number]  Kathie Dike, MD Inpatient   09/27/2016 0520 09/28/2016 2217 Full Code [id number]  Orvan Falconer, MD Inpatient   08/25/2014 0052 08/31/2014 1813 Full Code [id number]  John Giovanni, PA-C Inpatient         IV Access:   Peripheral IV   Procedures and diagnostic studies:   DG Chest Port 1 View  Result Date: 03/01/2022 CLINICAL DATA:  Shortness of breath EXAM: PORTABLE CHEST 1 VIEW COMPARISON:  09/25/2019 FINDINGS: Enlargement of the cardiac silhouette, increased from prior. Prior sternotomy and CABG. Diffusely prominent interstitial markings bilaterally with streaky bibasilar opacities. Possible trace pleural effusions. No pneumothorax. IMPRESSION: 1. Findings suggestive of CHF with pulmonary edema. 2. Enlargement of the cardiac silhouette, increased from prior. Underlying pericardial effusion not excluded. Electronically Signed   By: Davina Poke D.O.   On: 03/01/2022 15:40     Medical Consultants:   None.   Subjective:  Misty Edwards relates her breathing is better than yesterday.  Objective:    Vitals:   03/02/22 0000 03/02/22 0103 03/02/22 0436 03/02/22 0500  BP: 139/64 (!) 174/62 (!) 148/71   Pulse: 76 76 75   Resp: (!) 29 16 (!) 22   Temp:  97.9 F (36.6 C) 98.2 F (36.8 C)   TempSrc:   Oral   SpO2: 100% 94% 93%   Weight:  70.1 kg  70.2 kg   Height:  '5\' 3"'$  (1.6 m)     SpO2: 93 % O2 Flow Rate (L/min): 2 L/min FiO2 (%): 21 %   Intake/Output Summary (Last 24 hours) at 03/02/2022 0745 Last data filed at 03/02/2022 0532 Gross per 24 hour  Intake 240 ml  Output 1150 ml  Net -910 ml   Filed Weights   03/01/22 1508 03/02/22 0103 03/02/22 0500  Weight: 72.1 kg 70.1 kg 70.2 kg    Exam: General exam: In no acute distress. Respiratory system: Good air movement and clear to auscultation. Cardiovascular system: S1 & S2 heard, RRR.  Negative JVD Gastrointestinal system: Abdomen is nondistended, soft and nontender.  Extremities: No pedal edema. Skin: No rashes, lesions or ulcers Psychiatry: Judgement and insight appear normal. Mood & affect appropriate.    Data Reviewed:    Labs: Basic Metabolic Panel: Recent Labs  Lab 03/01/22 1625 03/01/22 1828  NA 125*  --   K 3.9  --   CL 88*  --   CO2 25  --   GLUCOSE 182*  --   BUN 16  --   CREATININE 0.86  --   CALCIUM 9.1  --   MG  --  2.0   GFR Estimated Creatinine Clearance: 38.4 mL/min (by C-G formula based on SCr of 0.86 mg/dL). Liver Function Tests: No results for input(s): "AST", "ALT", "ALKPHOS", "BILITOT", "PROT", "ALBUMIN" in the last 168 hours. No results for input(s): "LIPASE", "AMYLASE" in the last 168 hours. No results for input(s): "AMMONIA" in the last 168 hours. Coagulation profile No results for input(s): "INR", "PROTIME" in the last 168 hours. COVID-19 Labs  No results for input(s): "DDIMER", "FERRITIN", "LDH", "CRP" in the last 72 hours.  Lab Results  Component Value Date   SARSCOV2NAA NEGATIVE 03/01/2022   [place] NEGATIVE 09/23/2019   SARSCOV2NAA NOT DETECTED 09/21/2018   SARSCOV2NAA NOT DETECTED 09/15/2018    CBC: Recent Labs  Lab 03/01/22 1625  WBC 9.2  NEUTROABS 8.0*  HGB 12.6  HCT 37.7  MCV 85.1  PLT 203   Cardiac Enzymes: No results for input(s): "CKTOTAL", "CKMB", "CKMBINDEX", "TROPONINI" in the last 168 hours. BNP  (last 3 results) No results for input(s): "PROBNP" in the last 8760 hours. CBG: Recent Labs  Lab 03/01/22 2046 03/01/22 2220 03/02/22 0139  GLUCAP 190* 211* 234*   D-Dimer: No results for input(s): "DDIMER" in the last 72 hours. Hgb A1c: No results for input(s): "HGBA1C" in the last 72 hours. Lipid Profile: No results for input(s): "CHOL", "HDL", "LDLCALC", "TRIG", "CHOLHDL", "LDLDIRECT" in the last 72 hours. Thyroid function studies: Recent Labs    03/01/22 1828  TSH 0.673   Anemia work up: No results for input(s): "VITAMINB12", "FOLATE", "FERRITIN", "TIBC", "IRON", "RETICCTPCT" in the last 72 hours. Sepsis Labs: Recent Labs  Lab 03/01/22 1625  WBC 9.2   Microbiology Recent Results (from the past 240 hour(s))  Resp panel by RT-PCR (RSV, Flu A&B, Covid) Anterior Nasal Swab     Status: None   Collection Time: 03/01/22  3:10  PM   Specimen: Anterior Nasal Swab  Result Value Ref Range Status   SARS Coronavirus 2 by RT PCR NEGATIVE NEGATIVE Final    Comment: (NOTE) SARS-CoV-2 target nucleic acids are NOT DETECTED.  The SARS-CoV-2 RNA is generally detectable in upper respiratory specimens during the acute phase of infection. The lowest concentration of SARS-CoV-2 viral copies this assay can detect is 138 copies/mL. A negative result does not preclude SARS-Cov-2 infection and should not be used as the sole basis for treatment or other patient management decisions. A negative result may occur with  improper specimen collection/handling, submission of specimen other than nasopharyngeal swab, presence of viral mutation(s) within the areas targeted by this assay, and inadequate number of viral copies(<138 copies/mL). A negative result must be combined with clinical observations, patient history, and epidemiological information. The expected result is Negative.  Fact Sheet for Patients:  EntrepreneurPulse.com.au  Fact Sheet for Healthcare Providers:   IncredibleEmployment.be  This test is no t yet approved or cleared by the [place] FDA and  has been authorized for detection and/or diagnosis of SARS-CoV-2 by FDA under an Emergency Use Authorization (EUA). This EUA will remain  in effect (meaning this test can be used) for the duration of the COVID-19 declaration under Section 564(b)(1) of the Act, 21 U.S.C.section 360bbb-3(b)(1), unless the authorization is terminated  or revoked sooner.       Influenza A by PCR NEGATIVE NEGATIVE Final   Influenza B by PCR NEGATIVE NEGATIVE Final    Comment: (NOTE) The Xpert Xpress SARS-CoV-2/FLU/RSV plus assay is intended as an aid in the diagnosis of influenza from Nasopharyngeal swab specimens and should not be used as a sole basis for treatment. Nasal washings and aspirates are unacceptable for Xpert Xpress SARS-CoV-2/FLU/RSV testing.  Fact Sheet for Patients: EntrepreneurPulse.com.au  Fact Sheet for Healthcare Providers: IncredibleEmployment.be  This test is not yet approved or cleared by the [place] FDA and has been authorized for detection and/or diagnosis of SARS-CoV-2 by FDA under an Emergency Use Authorization (EUA). This EUA will remain in effect (meaning this test can be used) for the duration of the COVID-19 declaration under Section 564(b)(1) of the Act, 21 U.S.C. section 360bbb-3(b)(1), unless the authorization is terminated or revoked.     Resp Syncytial Virus by PCR NEGATIVE NEGATIVE Final    Comment: (NOTE) Fact Sheet for Patients: EntrepreneurPulse.com.au  Fact Sheet for Healthcare Providers: IncredibleEmployment.be  This test is not yet approved or cleared by the [place] FDA and has been authorized for detection and/or diagnosis of SARS-CoV-2 by FDA under an Emergency Use Authorization (EUA). This EUA will remain in effect (meaning this test can be used) for  the duration of the COVID-19 declaration under Section 564(b)(1) of the Act, 21 U.S.C. section 360bbb-3(b)(1), unless the authorization is terminated or revoked.  Performed at [facility], [address] [place], [place] [address]      Medications:    aspirin EC  81 mg Oral Daily   enoxaparin (LOVENOX) injection  70 mg Subcutaneous Q12H   FLUoxetine  20 mg Oral Daily   furosemide  40 mg Intravenous BID   insulin aspart  0-15 Units Subcutaneous TID WC   insulin aspart  0-5 Units Subcutaneous QHS   insulin glargine-yfgn  40 Units Subcutaneous QHS   lipase/protease/amylase  48,000 Units Oral TID WC   metoprolol tartrate  25 mg Oral BID   pantoprazole  40 mg Oral Daily   potassium chloride  40 mEq Oral BID  rosuvastatin  5 mg Oral Daily   Continuous Infusions:    LOS: 1 day   Charlynne Cousins  [facility]  03/02/2022, 7:45 AM

## 2022-03-03 DIAGNOSIS — E1159 Type 2 diabetes mellitus with other circulatory complications: Secondary | ICD-10-CM | POA: Diagnosis not present

## 2022-03-03 DIAGNOSIS — I4891 Unspecified atrial fibrillation: Secondary | ICD-10-CM | POA: Diagnosis not present

## 2022-03-03 DIAGNOSIS — I340 Nonrheumatic mitral (valve) insufficiency: Secondary | ICD-10-CM

## 2022-03-03 DIAGNOSIS — E871 Hypo-osmolality and hyponatremia: Secondary | ICD-10-CM | POA: Diagnosis not present

## 2022-03-03 DIAGNOSIS — I5033 Acute on chronic diastolic (congestive) heart failure: Secondary | ICD-10-CM | POA: Diagnosis not present

## 2022-03-03 LAB — BASIC METABOLIC PANEL
Anion gap: 7 (ref 5–15)
BUN: 18 mg/dL (ref 8–23)
CO2: 30 mmol/L (ref 22–32)
Calcium: 8.6 mg/dL — ABNORMAL LOW (ref 8.9–10.3)
Chloride: 92 mmol/L — ABNORMAL LOW (ref 98–111)
Creatinine, Ser: 0.93 mg/dL (ref 0.44–1.00)
GFR, Estimated: 57 mL/min — ABNORMAL LOW (ref >60)
Glucose, Bld: 174 mg/dL — ABNORMAL HIGH (ref 70–99)
Potassium: 4.5 mmol/L (ref 3.5–5.1)
Sodium: 129 mmol/L — ABNORMAL LOW (ref 135–145)

## 2022-03-03 LAB — GLUCOSE, CAPILLARY
Glucose-Capillary: 160 mg/dL — ABNORMAL HIGH (ref 70–99)
Glucose-Capillary: 189 mg/dL — ABNORMAL HIGH (ref 70–99)
Glucose-Capillary: 198 mg/dL — ABNORMAL HIGH (ref 70–99)
Glucose-Capillary: 235 mg/dL — ABNORMAL HIGH (ref 70–99)
Glucose-Capillary: 250 mg/dL — ABNORMAL HIGH (ref 70–99)

## 2022-03-03 LAB — HEMOGLOBIN A1C
Hgb A1c MFr Bld: 6 % — ABNORMAL HIGH (ref 4.8–5.6)
Mean Plasma Glucose: 126 mg/dL

## 2022-03-03 MED ORDER — SODIUM CHLORIDE 0.9 % IV SOLN: INTRAVENOUS | Status: AC

## 2022-03-03 MED ORDER — INSULIN ASPART 100 UNIT/ML IJ SOLN
4.0000 [IU] | Freq: Three times a day (TID) | INTRAMUSCULAR | Status: DC
  Administered 2022-03-03 – 2022-03-04 (×2): 4 [IU] via SUBCUTANEOUS

## 2022-03-03 MED ORDER — MICONAZOLE NITRATE 2 % VA CREA
1.0000 | TOPICAL_CREAM | Freq: Every day | VAGINAL | Status: DC
  Filled 2022-03-03: qty 45

## 2022-03-03 MED ORDER — APIXABAN 5 MG PO TABS
5.0000 mg | ORAL_TABLET | Freq: Two times a day (BID) | ORAL | Status: DC
  Administered 2022-03-03 – 2022-03-05 (×4): 5 mg via ORAL
  Filled 2022-03-03 (×4): qty 1

## 2022-03-03 MED ORDER — SODIUM CHLORIDE 0.9 % IV SOLN: INTRAVENOUS | Status: DC

## 2022-03-03 MED ORDER — AMLODIPINE BESYLATE 5 MG PO TABS
5.0000 mg | ORAL_TABLET | Freq: Every day | ORAL | Status: DC
  Administered 2022-03-03 – 2022-03-05 (×3): 5 mg via ORAL
  Filled 2022-03-03 (×3): qty 1

## 2022-03-03 MED ORDER — INSULIN GLARGINE-YFGN 100 UNIT/ML ~~LOC~~ SOLN
15.0000 [IU] | Freq: Two times a day (BID) | SUBCUTANEOUS | Status: DC
  Administered 2022-03-03: 15 [IU] via SUBCUTANEOUS
  Filled 2022-03-03 (×4): qty 0.15

## 2022-03-03 MED ORDER — METOPROLOL TARTRATE 50 MG PO TABS
50.0000 mg | ORAL_TABLET | Freq: Two times a day (BID) | ORAL | Status: DC
  Administered 2022-03-03 – 2022-03-05 (×5): 50 mg via ORAL
  Filled 2022-03-03 (×5): qty 1

## 2022-03-03 NOTE — Progress Notes (Signed)
Patient oxygen saturation is 88 percent on 2/LM. Respiratory consulted. Patient placed on 3.5L/M  and oxygen saturation is up to 91 percent. MD notified

## 2022-03-03 NOTE — Progress Notes (Addendum)
ANTICOAGULATION CONSULT NOTE - Initial Consult  Pharmacy Consult for Eliquis Indication: atrial fibrillation  Allergies  Allergen Reactions   Calcium-Containing Compounds Nausea Only   Morphine And Related Other (See Comments)    Hallucination   Raloxifene Itching    Evista- Face and eyes burning   Vitamin D Analogs Nausea Only    Patient Measurements: Height: _0  (160 cm) Weight: 69.5 kg (153 lb 3.5 oz) IBW/kg (Calculated) : 52.4  Vital Signs: Temp: 97.9 F (36.6 C) (12/18 0352) BP: 148/91 (12/18 0352) Pulse Rate: 83 (12/18 0352)  Labs: Recent Labs    03/01/22 1625 03/01/22 1828 03/02/22 0647 03/03/22 0611  HGB 12.6  --  11.8*  --   HCT 37.7  --  35.0*  --   PLT 203  --  174  --   CREATININE 0.86  --  0.88 0.93  TROPONINIHS 10 12  --   --     Estimated Creatinine Clearance: 35.3 mL/min (by C-G formula based on SCr of 0.93 mg/dL).   Medical History: Past Medical History:  Diagnosis Date   Acute biliary pancreatitis 07/2002   thia was in 07/2004:she still has pseudocyst in tail of pancreas measuring 42 x 33 mm    Adrenal adenoma    bilateral   Anxiety    CAD (coronary artery disease)    a. s/p CABG in 08/2014 with LIMA-LAD, SVG-OM and SVG-PDA   Chronic pancreatitis ([place])    Detached retina    Diabetes mellitus ([place])    Diverticulosis    Heartburn    History of carcinoma in situ of breast 1988   Hyperlipidemia    Hypertension    IBS (irritable bowel syndrome)    Legally blind    Osteoarthritis    Osteopenia    Upper GI bleed September2004   secondary to gastritis    Medications:  Medications Prior to Admission  Medication Sig Dispense Refill Last Dose   amLODipine (NORVASC) 5 MG tablet TAKE 1 TABLET BY MOUTH ONCE DAILY . APPOINTMENT REQUIRED FOR FUTURE REFILLS (Patient taking differently: Take 5 mg by mouth daily.) 90 tablet 1 02/28/2022   aspirin EC 81 MG EC tablet Take 1 tablet (81 mg total) by mouth daily.   03/01/2022   calcium carbonate  (TUMS - DOSED IN MG ELEMENTAL CALCIUM) 500 MG chewable tablet Chew 3 tablets by mouth at bedtime.   unknown   CREON 24000-76000 units CPEP TAKE 2 CAPSULES BY MOUTH WITH MEALS AND 1 CAP WITH SNACKS x 2 300 capsule 11 03/01/2022   EQ ALLERGY RELIEF, CETIRIZINE, 10 MG tablet Take 1 tablet by mouth once daily 90 tablet 0 02/28/2022   ferrous sulfate 325 (65 FE) MG tablet Take 325 mg by mouth daily with breakfast.   03/01/2022   FLUoxetine (PROZAC) 20 MG capsule Take 1 capsule (20 mg total) by mouth daily. 90 capsule 3 02/28/2022   furosemide (LASIX) 20 MG tablet Take 1 tablet by mouth once daily 90 tablet 0 03/01/2022   gabapentin (NEURONTIN) 100 MG capsule TAKE 1 CAPSULE BY MOUTH THREE TIMES DAILY AS NEEDED FOR NERVE PAIN (Patient taking differently: Take 100 mg by mouth 3 (three) times daily as needed (nerve pain).) 90 capsule 0 03/01/2022   insulin NPH-regular Human (NOVOLIN 70/30 RELION) (70-30) 100 UNIT/ML injection INJECT 25 UNITS SUBCUTANEOUSLY IN THE MORNING AND 15 IN THE EVENING (Patient taking differently: Inject into the skin See admin instructions. INJECT 42 UNITS SUBCUTANEOUSLY IN THE MORNING AND 22 IN THE EVENING) 10  mL 2 03/01/2022   LORazepam (ATIVAN) 1 MG tablet TAKE 1/2 TO 1 (ONE-HALF TO ONE) TABLET BY MOUTH TWICE DAILY AS NEEDED (Patient taking differently: Take 1 mg by mouth See admin instructions. TAKE 1/2 TO 1 (ONE-HALF TO ONE) TABLET BY MOUTH TWICE DAILY AS NEEDED) 30 tablet 0 02/28/2022   MAGNESIUM-OXIDE 400 (240 Mg) MG tablet TAKE 1 TABLET BY MOUTH ONCE DAILY . APPOINTMENT REQUIRED FOR FUTURE REFILLS (Patient taking differently: Take 400 mg by mouth daily.) 30 tablet 6 02/28/2022   metoprolol tartrate (LOPRESSOR) 25 MG tablet Take 1 tablet by mouth twice daily 180 tablet 3 03/01/2022 at 0800   Multiple Vitamin (MULTIVITAMIN) capsule Take 1 capsule by mouth every morning.    03/01/2022   pantoprazole (PROTONIX) 40 MG tablet Take 1 tablet by mouth once daily 90 tablet 0 03/01/2022    potassium chloride (KLOR-CON) 10 MEQ tablet TAKE 1 TABLET BY MOUTH ONCE DAILY AS NEEDED (TAKE WITH FUROSEMIDE) (Patient taking differently: Take 10 mEq by mouth See admin instructions. TAKE 1 TABLET BY MOUTH ONCE DAILY AS NEEDED (TAKE  WITH  FUROSEMIDE)) 90 tablet 3 03/01/2022   rosuvastatin (CRESTOR) 5 MG tablet Take 1 tablet by mouth once daily 90 tablet 3 02/28/2022   triamcinolone cream (KENALOG) 0.1 % Apply topically 2 (two) times daily. 60 g 0 03/01/2022   BD INSULIN SYRINGE U/F 31G X 5/16" 0.5 ML MISC USE AS DIRECTED TWICE DAILY 200 each 0    Blood Glucose Monitoring Suppl (BLOOD GLUCOSE SYSTEM PAK) KIT Please dispense as One Touch Ultra. Use as directed to monitor FSBS 4x daily. Dx: E11.65 1 kit 1    Lancets Misc. MISC Pt has Accu Chek Aviva Plus - Needs just lancets  Checks BS 4-5 times per day. Disp 3 boxes(90 day Supply)/4 refills Dx code. E11.9 450 each 3    OneTouch Delica Lancets 09W MISC CHECK BLOOD SUGAR 4-5 TIMES PER DAY 300 each 0    ONETOUCH ULTRA test strip USE 1 STRIP TO CHECK GLUCOSE FIVE TIMES DAILY 300 each 0     Assessment: 88 y.o. female with a hx of CAD (s/p CABG in 08/2014 ) HTN, HLD, moderate MR and Stage 3 CKD , Asked to transition lovenox to eliquis for new-onset atrial fibrillation.   received lovenox this AM around 0600, will start eliquis ~ 12 hours later   Goal of Therapy:  Monitor platelets by anticoagulation protocol: Yes   Plan:  D/C lovenox Eliquis 70m po BID Educate on eliquis Monitor for S/S of bleeding  LIsac Sarna BS Pharm D, BCPS Clinical Pharmacist 03/03/2022,10:33 AM

## 2022-03-03 NOTE — Progress Notes (Addendum)
[facility] PROGRESS NOTE    Progress Note  DALLY OSHEL  PHX:[id number] DOB: 1926/05/01 DOA: 03/01/2022 PCP: Susy Frizzle, MD     Brief Narrative:   Misty Edwards is an 88 y.o. female past medical history significant for coronary artery disease, diabetes mellitus type 2, chronic diastolic heart failure comes in for worsening shortness of breath that started several days prior to admission   Assessment/Plan:    New onset atrial fibrillation with RVR: Currently rate controlled.  She is currently n.p.o. Continue home metoprolol, started on Lovenox. Cardiology has been consulted for possible TEE cardioversion.  Chronic diastolic HF (congestive heart failure) (HCC) Blood pressure has remained relatively stable. Acute heart failure has been ruled out. Her estimated dry weight is around 67 to 69 kg. She appears euvolemic on physical exam, these findings can probably be due to A-fib with RVR. Hold home dose of Lasix today as she is vomiting and p.o. pain with decreased oral intake. Blood cultures have remained negative till date.  Nausea and vomiting overnight: Was given Zofran and improved. Has remained afebrile no leukocytosis she denies any diarrhea.  Blood pressure is stable. Denies any abdominal pain she is on Protonix. Current n.p.o. started on IV fluids monitor strict I's and O's and daily weights.  Hypervolemic hyponatremia: Improved with rate control.  Normocytic anemia: She relates no signs of bleeding continue aspirin, she is currently on Lovenox.  Type 2 diabetes mellitus with vascular disease and hyperglycemia: She was started on long-acting insulin blood glucose 182 230. Will add sliding scale insulin. Check an A1c  Essential hypertension Blood pressure significantly stable. Continue Lovenox and metoprolol.  Will resume home dose of Norvasc. GERD Continue Protonix.  S/P CABG x 3 Continue aspirin.   DVT prophylaxis: lovenox Family  Communication:none Status is: Inpatient Remains inpatient appropriate because: Acute preserve heart failure    Code Status:     Code Status Orders  (From admission, onward)           Start     Ordered   03/01/22 1934  Full code  Continuous       Question:  By:  Answer:  Consent: discussion documented in EHR   03/01/22 1938           Code Status History     Date Active Date Inactive Code Status Order ID Comments User Context   09/23/2019 2311 09/26/2019 1944 Full Code [id number]  Misty Patricia, MD Inpatient   09/13/2018 1408 09/23/2018 1729 Full Code [id number]  Misty Iba, MD Inpatient   09/03/2018 1109 09/06/2018 2122 Full Code [id number]  Misty Hamman, MD Inpatient   10/23/2016 1642 10/24/2016 2021 Full Code [id number]  Misty Dike, MD Inpatient   09/27/2016 0520 09/28/2016 2217 Full Code [id number]  Misty Falconer, MD Inpatient   08/25/2014 0052 08/31/2014 1813 Full Code [id number]  Misty Giovanni, PA-C Inpatient         IV Access:   Peripheral IV   Procedures and diagnostic studies:   ECHOCARDIOGRAM COMPLETE  Result Date: 03/02/2022    ECHOCARDIOGRAM REPORT   Patient Name:   Misty Edwards Date of Exam: 03/02/2022 Medical Rec #:  [id number]       Height:       63.0 in Accession #:    [id number]      Weight:       154.8 lb Date of Birth:  November 05, 1926       BSA:  1.734 m Patient Age:    24 years        BP:           148/71 mmHg Patient Gender: F               HR:           76 bpm. Exam Location:  Inpatient Procedure: 2D Echo, Cardiac Doppler and Color Doppler Indications:    I50.40* Unspecified combined systolic (congestive) and diastolic                 (congestive) heart failure; I48.91* Unspeicified atrial                 fibrillation  History:        Patient has prior history of Echocardiogram examinations, most                 recent 09/25/2019. CHF, CAD and Previous Myocardial Infarction,                 Abnormal ECG and Prior CABG, Arrythmias:Atrial  Fibrillation;                 Risk Factors:Diabetes and Dyslipidemia. Cancer.  Sonographer:    Roseanna Rainbow RDCS Referring Phys: [phone] JACOB J STINSON  Sonographer Comments: Technically difficult study due to poor echo windows. IMPRESSIONS  1. Left ventricular ejection fraction, by estimation, is 60 to 65%. The left ventricle has normal function. The left ventricle demonstrates regional wall motion abnormalities (see scoring diagram/findings for description). There is moderate asymmetric left ventricular hypertrophy of the basal-septal segment. Left ventricular diastolic parameters are indeterminate.  2. Right ventricular systolic function is mildly reduced. The right ventricular size is normal. There is normal pulmonary artery systolic pressure. The estimated right ventricular systolic pressure is 62.5 mmHg.  3. Left atrial size was moderately dilated.  4. At least moderate mitral regurgitation with horizontal color splay and with Doppler evidnece of right sided pulmonary vein flow reversal. The mitral valve is normal in structure. Moderate mitral valve regurgitation.  5. The aortic valve is tricuspid. Aortic valve regurgitation is trivial. No aortic stenosis is present. Comparison(s): Mitral regurgitation has increased. FINDINGS  Left Ventricle: Left ventricular ejection fraction, by estimation, is 60 to 65%. The left ventricle has normal function. The left ventricle demonstrates regional wall motion abnormalities. The left ventricular internal cavity size was normal in size. There is moderate asymmetric left ventricular hypertrophy of the basal-septal segment. Left ventricular diastolic parameters are indeterminate.  LV Wall Scoring: The apical septal segment is hypokinetic. Right Ventricle: The right ventricular size is normal. No increase in right ventricular wall thickness. Right ventricular systolic function is mildly reduced. There is normal pulmonary artery systolic pressure. The tricuspid regurgitant velocity  is 2.44 m/s, and with an assumed right atrial pressure of 8 mmHg, the estimated right ventricular systolic pressure is 63.8 mmHg. Left Atrium: Left atrial size was moderately dilated. Right Atrium: Right atrial size was normal in size. Pericardium: There is no evidence of pericardial effusion. Mitral Valve: At least moderate mitral regurgitation with horizontal color splay and with Doppler evidnece of right sided pulmonary vein flow reversal. The mitral valve is normal in structure. Moderate mitral valve regurgitation. MV peak gradient, 10.1 mmHg. The mean mitral valve gradient is 3.0 mmHg. Tricuspid Valve: The tricuspid valve is normal in structure. Tricuspid valve regurgitation is trivial. No evidence of tricuspid stenosis. Aortic Valve: The aortic valve is tricuspid. Aortic valve regurgitation is trivial. No aortic  stenosis is present. Pulmonic Valve: The pulmonic valve was not well visualized. Pulmonic valve regurgitation is mild. No evidence of pulmonic stenosis. Aorta: The aortic root and ascending aorta are structurally normal, with no evidence of dilitation. IAS/Shunts: No atrial level shunt detected by color flow Doppler.  LEFT VENTRICLE PLAX 2D LVIDd:         4.10 cm LVIDs:         2.60 cm LV PW:         0.90 cm LV IVS:        1.10 cm LVOT diam:     2.30 cm LV SV:         83 LV SV Index:   48 LVOT Area:     4.15 cm  RIGHT VENTRICLE            IVC RV S prime:     5.87 cm/s  IVC diam: 2.30 cm TAPSE (M-mode): 0.7 cm LEFT ATRIUM             Index        RIGHT ATRIUM           Index LA diam:        4.40 cm 2.54 cm/m   RA Area:     15.70 cm LA Vol (A2C):   56.0 ml 32.30 ml/m  RA Volume:   38.30 ml  22.09 ml/m LA Vol (A4C):   56.6 ml 32.61 ml/m LA Biplane Vol: 68.1 ml 39.27 ml/m  AORTIC VALVE             PULMONIC VALVE LVOT Vmax:   114.00 cm/s PR End Diast Vel: 3.00 msec LVOT Vmean:  75.700 cm/s LVOT VTI:    0.199 m  AORTA Ao Root diam: 2.60 cm Ao Asc diam:  2.50 cm MITRAL VALVE                  TRICUSPID  VALVE MV Area (PHT): 3.99 cm       TR Peak grad:   23.8 mmHg MV Area VTI:   2.41 cm       TR Vmax:        244.00 cm/s MV Peak grad:  10.1 mmHg MV Mean grad:  3.0 mmHg       SHUNTS MV Vmax:       1.59 m/s       Systemic VTI:  0.20 m MV Vmean:      82.1 cm/s      Systemic Diam: 2.30 cm MV Decel Time: 190 msec MR Peak grad:    83.5 mmHg MR Mean grad:    58.0 mmHg MR Vmax:         457.00 cm/s MR Vmean:        355.0 cm/s MR PISA:         1.01 cm MR PISA Eff ROA: 9 mm MR PISA Radius:  0.40 cm MV E velocity: 142.00 cm/s Rudean Haskell MD Electronically signed by Rudean Haskell MD Signature Date/Time: 03/02/2022/6:51:46 PM    Final    DG Chest Port 1 View  Result Date: 03/01/2022 CLINICAL DATA:  Shortness of breath EXAM: PORTABLE CHEST 1 VIEW COMPARISON:  09/25/2019 FINDINGS: Enlargement of the cardiac silhouette, increased from prior. Prior sternotomy and CABG. Diffusely prominent interstitial markings bilaterally with streaky bibasilar opacities. Possible trace pleural effusions. No pneumothorax. IMPRESSION: 1. Findings suggestive of CHF with pulmonary edema. 2. Enlargement of the cardiac silhouette, increased from prior. Underlying pericardial effusion not excluded. Electronically  Signed   By: Davina Poke D.O.   On: 03/01/2022 15:40     Medical Consultants:   None.   Subjective:    [name] relate she started vomiting overnight has not vomited this morning.  Objective:    Vitals:   03/02/22 1403 03/02/22 2157 03/03/22 0352 03/03/22 0500  BP: (!) 142/67 (!) 178/73 (!) 148/91   Pulse: 82 80 83   Resp: 12 (!) 24 (!) 21   Temp: 98.2 F (36.8 C) 98 F (36.7 C) 97.9 F (36.6 C)   TempSrc: Oral     SpO2: 94% 96% 97%   Weight:    69.5 kg  Height:       SpO2: 97 % O2 Flow Rate (L/min): 2 L/min FiO2 (%): 21 %   Intake/Output Summary (Last 24 hours) at 03/03/2022 0836 Last data filed at 03/03/2022 0500 Gross per 24 hour  Intake 120 ml  Output 400 ml  Net -280  ml    Filed Weights   03/02/22 0103 03/02/22 0500 03/03/22 0500  Weight: 70.1 kg 70.2 kg 69.5 kg    Exam: General exam: In no acute distress. Respiratory system: Good air movement and clear to auscultation. Cardiovascular system: S1 & S2 heard, RRR. No JVD. Gastrointestinal system: Abdomen is nondistended, soft and nontender.  Extremities: No pedal edema. Skin: No rashes, lesions or ulcers Psychiatry: Judgement and insight appear normal. Mood & affect appropriate.  Data Reviewed:    Labs: Basic Metabolic Panel: Recent Labs  Lab 03/01/22 1625 03/01/22 1828 03/02/22 0647 03/03/22 0611  NA 125*  --  130* 129*  K 3.9  --  3.7 4.5  CL 88*  --  88* 92*  CO2 25  --  30 30  GLUCOSE 182*  --  190* 174*  BUN 16  --  17 18  CREATININE 0.86  --  0.88 0.93  CALCIUM 9.1  --  8.7* 8.6*  MG  --  2.0  --   --     GFR Estimated Creatinine Clearance: 35.3 mL/min (by C-G formula based on SCr of 0.93 mg/dL). Liver Function Tests: No results for input(s): "AST", "ALT", "ALKPHOS", "BILITOT", "PROT", "ALBUMIN" in the last 168 hours. No results for input(s): "LIPASE", "AMYLASE" in the last 168 hours. No results for input(s): "AMMONIA" in the last 168 hours. Coagulation profile No results for input(s): "INR", "PROTIME" in the last 168 hours. COVID-19 Labs  No results for input(s): "DDIMER", "FERRITIN", "LDH", "CRP" in the last 72 hours.  Lab Results  Component Value Date   SARSCOV2NAA NEGATIVE 03/01/2022   [place] NEGATIVE 09/23/2019   SARSCOV2NAA NOT DETECTED 09/21/2018   SARSCOV2NAA NOT DETECTED 09/15/2018    CBC: Recent Labs  Lab 03/01/22 1625 03/02/22 0647  WBC 9.2 6.8  NEUTROABS 8.0*  --   HGB 12.6 11.8*  HCT 37.7 35.0*  MCV 85.1 85.2  PLT 203 174    Cardiac Enzymes: No results for input(s): "CKTOTAL", "CKMB", "CKMBINDEX", "TROPONINI" in the last 168 hours. BNP (last 3 results) No results for input(s): "PROBNP" in the last 8760 hours. CBG: Recent Labs  Lab  03/02/22 0748 03/02/22 1131 03/02/22 1644 03/02/22 2239 03/03/22 0736  GLUCAP 185* 290* 169* 198* 189*    D-Dimer: No results for input(s): "DDIMER" in the last 72 hours. Hgb A1c: No results for input(s): "HGBA1C" in the last 72 hours. Lipid Profile: No results for input(s): "CHOL", "HDL", "LDLCALC", "TRIG", "CHOLHDL", "LDLDIRECT" in the last 72 hours. Thyroid function studies: Recent Labs  03/01/22 1828  TSH 0.673    Anemia work up: No results for input(s): "VITAMINB12", "FOLATE", "FERRITIN", "TIBC", "IRON", "RETICCTPCT" in the last 72 hours. Sepsis Labs: Recent Labs  Lab 03/01/22 1625 03/02/22 0647  WBC 9.2 6.8    Microbiology Recent Results (from the past 240 hour(s))  Resp panel by RT-PCR (RSV, Flu A&B, Covid) Anterior Nasal Swab     Status: None   Collection Time: 03/01/22  3:10 PM   Specimen: Anterior Nasal Swab  Result Value Ref Range Status   SARS Coronavirus 2 by RT PCR NEGATIVE NEGATIVE Final    Comment: (NOTE) SARS-CoV-2 target nucleic acids are NOT DETECTED.  The SARS-CoV-2 RNA is generally detectable in upper respiratory specimens during the acute phase of infection. The lowest concentration of SARS-CoV-2 viral copies this assay can detect is 138 copies/mL. A negative result does not preclude SARS-Cov-2 infection and should not be used as the sole basis for treatment or other patient management decisions. A negative result may occur with  improper specimen collection/handling, submission of specimen other than nasopharyngeal swab, presence of viral mutation(s) within the areas targeted by this assay, and inadequate number of viral copies(<138 copies/mL). A negative result must be combined with clinical observations, patient history, and epidemiological information. The expected result is Negative.  Fact Sheet for Patients:  EntrepreneurPulse.com.au  Fact Sheet for Healthcare Providers:   IncredibleEmployment.be  This test is no t yet approved or cleared by the [place] FDA and  has been authorized for detection and/or diagnosis of SARS-CoV-2 by FDA under an Emergency Use Authorization (EUA). This EUA will remain  in effect (meaning this test can be used) for the duration of the COVID-19 declaration under Section 564(b)(1) of the Act, 21 U.S.C.section 360bbb-3(b)(1), unless the authorization is terminated  or revoked sooner.       Influenza A by PCR NEGATIVE NEGATIVE Final   Influenza B by PCR NEGATIVE NEGATIVE Final    Comment: (NOTE) The Xpert Xpress SARS-CoV-2/FLU/RSV plus assay is intended as an aid in the diagnosis of influenza from Nasopharyngeal swab specimens and should not be used as a sole basis for treatment. Nasal washings and aspirates are unacceptable for Xpert Xpress SARS-CoV-2/FLU/RSV testing.  Fact Sheet for Patients: EntrepreneurPulse.com.au  Fact Sheet for Healthcare Providers: IncredibleEmployment.be  This test is not yet approved or cleared by the [place] FDA and has been authorized for detection and/or diagnosis of SARS-CoV-2 by FDA under an Emergency Use Authorization (EUA). This EUA will remain in effect (meaning this test can be used) for the duration of the COVID-19 declaration under Section 564(b)(1) of the Act, 21 U.S.C. section 360bbb-3(b)(1), unless the authorization is terminated or revoked.     Resp Syncytial Virus by PCR NEGATIVE NEGATIVE Final    Comment: (NOTE) Fact Sheet for Patients: EntrepreneurPulse.com.au  Fact Sheet for Healthcare Providers: IncredibleEmployment.be  This test is not yet approved or cleared by the [place] FDA and has been authorized for detection and/or diagnosis of SARS-CoV-2 by FDA under an Emergency Use Authorization (EUA). This EUA will remain in effect (meaning this test can be used) for  the duration of the COVID-19 declaration under Section 564(b)(1) of the Act, 21 U.S.C. section 360bbb-3(b)(1), unless the authorization is terminated or revoked.  Performed at [facility], [address], [place], [place] [address]   Culture, blood (routine x 2)     Status: None (Preliminary result)   Collection Time: 03/01/22  4:25 PM   Specimen: BLOOD  Result Value Ref  Range Status   Specimen Description BLOOD BLOOD RIGHT HAND  Final   Special Requests BLOOD  Final   Culture   Final    NO GROWTH 2 DAYS Performed at [facility], [address] [place], [place] [address]    Report Status PENDING  Incomplete  Culture, blood (routine x 2)     Status: None (Preliminary result)   Collection Time: 03/01/22  4:25 PM   Specimen: BLOOD  Result Value Ref Range Status   Specimen Description BLOOD BLOOD LEFT HAND  Final   Special Requests BLOOD  Final   Culture   Final    NO GROWTH 2 DAYS Performed at [facility], [address], [place], [place] [address]    Report Status PENDING  Incomplete     Medications:    aspirin EC  81 mg Oral Daily   enoxaparin (LOVENOX) injection  70 mg Subcutaneous Q12H   FLUoxetine  20 mg Oral Daily   furosemide  20 mg Oral Daily   insulin aspart  0-15 Units Subcutaneous TID WC   insulin aspart  0-5 Units Subcutaneous QHS   insulin glargine-yfgn  10 Units Subcutaneous BID   lipase/protease/amylase  48,000 Units Oral TID WC   metoprolol tartrate  25 mg Oral BID   pantoprazole  40 mg Oral Daily   potassium chloride  40 mEq Oral BID   rosuvastatin  5 mg Oral Daily   Continuous Infusions:    LOS: 2 days   Charlynne Cousins  [facility]  03/03/2022, 8:36 AM

## 2022-03-03 NOTE — TOC Initial Note (Signed)
Transition of Care [facility]) - Initial/Assessment Note    Patient Details  Name: Misty Edwards MRN: [id number] Date of Birth: 04/13/1926  Transition of Care [facility]) CM/SW Contact:    Salome Arnt, [place] Phone Number: 03/03/2022, 2:05 PM  Clinical Narrative: Pt admitted with new onset afib with RVR. Assessment completed with pt. Pt reports she lives alone and has an aid 5 days a week, 4 hours a day. She has all needed DME in the home. Pt indicates she does well at home. PT evaluated pt and recommend home health. Pt states she has had home health in the past with [facility] and requests them again. Caryl Pina with [facility] accepts for HHPT. TOC will monitor for O2 needs at d/c.                  Expected Discharge Plan: [place] Barriers to Discharge: Continued Medical Work up   Patient Goals and CMS Choice Patient states their goals for this hospitalization and ongoing recovery are:: return home   Choice offered to / list presented to : Patient  Expected Discharge Plan and Services Expected Discharge Plan: [place] In-house Referral: Clinical Social Work   Post Acute Care Choice: [place] arrangements for the past 2 months: [place]: PT [place] Agency: [facility] ([place]) Date HH Agency Contacted: 03/03/22 Time [place]: [place] Representative spoke with at [place]: Caryl Pina  Prior Living Arrangements/Services Living arrangements for the past 2 months: [place] Lives with:: Self Patient language and need for interpreter reviewed:: Yes Do you feel safe going back to the place where you live?: Yes      Need for Family Participation in Patient Care: No (Comment)   Current home services: DME (walker, rollator, wheelchair, lift chair, hospital bed, BSC, shower chair) Criminal Activity/Legal Involvement Pertinent to Current Situation/Hospitalization: No - Comment  as needed  Activities of Daily Living Home Assistive Devices/Equipment: Environmental consultant (specify type), Dentures (specify type), Wheelchair, Eyeglasses, CBG Meter ADL Screening (condition at time of admission) Patient's cognitive ability adequate to safely complete daily activities?: Yes Is the patient deaf or have difficulty hearing?: Yes ([place] at times) Does the patient have difficulty seeing, even when wearing glasses/contacts?: No Does the patient have difficulty concentrating, remembering, or making decisions?: No Patient able to express need for assistance with ADLs?: Yes Does the patient have difficulty dressing or bathing?: No Independently performs ADLs?: Yes (appropriate for developmental age) Does the patient have difficulty walking or climbing stairs?: No Weakness of Legs: Both Weakness of Arms/Hands: Both  Permission Sought/Granted                  Emotional Assessment     Affect (typically observed): Appropriate Orientation: : Oriented to Self, Oriented to Place, Oriented to  Time, Oriented to Situation Alcohol / Substance Use: Not Applicable Psych Involvement: No (comment)  Admission diagnosis:  Hyponatremia [E87.1] New onset atrial fibrillation (HCC) [I48.91] Acute exacerbation of CHF (congestive heart failure) ([place]) [I50.9] Acute on chronic congestive heart failure, unspecified heart failure type [facility]) [I50.9] Patient Active Problem List   Diagnosis Date Noted   Nonrheumatic mitral valve regurgitation 03/03/2022   Acute exacerbation of CHF (congestive heart failure) ([place]) 03/01/2022   New onset atrial fibrillation ([place]) 03/01/2022   Exocrine pancreatic  insufficiency 10/04/2021   Heartburn 10/04/2021   Acute pulmonary edema (HCC)    Acute respiratory failure ([place]) 09/23/2019   Palliative care by specialist    Advanced care planning/counseling discussion    Goals of care, counseling/discussion    Disorientation    AKI (acute kidney injury) ([place]) 09/13/2018    Hyponatremia 09/13/2018   Herpes zoster ophthalmicus 09/03/2018   Right acetabular fracture ([place]) 09/03/2018   Syncope and collapse 09/03/2018   Protein-calorie malnutrition, severe 09/03/2018   Vaginal atrophy 04/29/2018   Angiokeratoma 04/29/2018   PMB (postmenopausal bleeding) 29/79/8921   Acute diastolic heart failure ([place]) 10/23/2016   Acute respiratory failure with hypoxia ([place]) 10/23/2016   Nephrolithiasis 09/27/2016   Hydronephrosis with renal and ureteral calculus obstruction 09/27/2016   Hydronephrosis 09/27/2016   Heme positive stool 09/05/2016   Dyslipidemia 03/07/2015   Type 2 diabetes mellitus with vascular disease ([place]) 03/07/2015   Anemia 09/26/2014   Lesion of lip 09/25/2014   S/P CABG x 3 08/25/2014   NSTEMI (non-ST elevated myocardial infarction) ([place]) 08/24/2014   Acute coronary syndrome (HCC)    IBS (irritable bowel syndrome) 08/26/2012   Thrombocytopenia ([place]) 02/02/2012   Epigastric pain 02/02/2012   Gastritis 02/02/2012   Abdominal pain 12/17/2011   Normocytic anemia 12/17/2011   History of colon polyps 12/17/2011   Constipation 12/17/2011   Family history of colon cancer 08/22/2011   LLQ pain 08/21/2011   Chronic pancreatitis ([place]) 19/41/7408   HELICOBACTER PYLORI INFECTION 01/26/2008   ADENOCARCINOMA, BREAST 01/26/2008   Essential hypertension 01/26/2008   External hemorrhoids 01/26/2008   GERD 01/26/2008   PSEUDOCYST, PANCREAS 01/26/2008   GALLSTONE PANCREATITIS 01/26/2008   GI BLEEDING 01/26/2008   UTI (urinary tract infection) 01/26/2008   NAUSEA 01/26/2008   VOMITING 01/26/2008   DIARRHEA 01/26/2008   PCP:  Susy Frizzle, MD Pharmacy:   [facility] [address], [place] - [place] [address] [place] [address] [place] [address] Phone: [phone] Fax: [phone]  [facility] - [place], [place] [address] Phone: [phone] Fax:  [phone]     Social Determinants of Health (SDOH) Interventions    Readmission Risk Interventions     No data to display

## 2022-03-03 NOTE — Evaluation (Signed)
Physical Therapy Evaluation Patient Details Name: Misty Edwards MRN: [id number] DOB: 1926-11-21 Today's Date: 03/03/2022  History of Present Illness  Pt is 88 yo with HE of CAD, DM, HTN who was admitted with exacerbation of COPD  Clinical Impression  PT activity tolerance has decreased and will benefit from [employer] services.         Recommendations for follow up therapy are one component of a multi-disciplinary discharge planning process, led by the attending physician.  Recommendations may be updated based on patient status, additional functional criteria and insurance authorization.  Follow Up Recommendations Home health PT      Assistance Recommended at Discharge Intermittent Supervision/Assistance  Patient can return home with the following  A little help with walking and/or transfers;A little help with bathing/dressing/bathroom;Assistance with cooking/housework    Equipment Recommendations None recommended by PT  Recommendations for Other Services       Functional Status Assessment Patient has had a recent decline in their functional status and demonstrates the ability to make significant improvements in function in a reasonable and predictable amount of time.     Precautions / Restrictions Precautions Precautions: None Restrictions Weight Bearing Restrictions: No      Mobility  Bed Mobility Overal bed mobility: Modified Independent                  Transfers Overall transfer level: Modified independent Equipment used: Rolling walker (2 wheels)                    Ambulation/Gait Ambulation/Gait assistance: Modified independent (Device/Increase time) Gait Distance (Feet): 40 Feet Assistive device: Rolling walker (2 wheels) Gait Pattern/deviations: Decreased step length - right, Decreased step length - left          Stairs            Wheelchair Mobility    Modified Rankin (Stroke Patients Only)       Balance                                              Pertinent Vitals/Pain Pain Assessment Pain Assessment: No/denies pain    Home Living Family/patient expects to be discharged to:: Private residence Living Arrangements: Alone Available Help at Discharge: Personal care attendant;Family (aide comes in 5 x a week for 4 hours) Type of Home: Mobile home Home Access: Ramped entrance       Home Layout: One level Home Equipment: Conservation officer, nature (2 wheels);Rollator (4 wheels);BSC/3in1      Prior Function Prior Level of Function : Needs assist               ADLs Comments: cooking /cleaning     Hand Dominance        Extremity/Trunk Assessment        Lower Extremity Assessment Lower Extremity Assessment: Generalized weakness       Communication   Communication: No difficulties  Cognition Arousal/Alertness: Awake/alert   Overall Cognitive Status: Within Functional Limits for tasks assessed                                          General Comments      Exercises     Assessment/Plan    PT Assessment Patient needs continued PT services  PT  Problem List Decreased strength;Decreased activity tolerance       PT Treatment Interventions Therapeutic activities;Therapeutic exercise;Gait training    PT Goals (Current goals can be found in the Care Plan section)       Frequency Min 2X/week     Co-evaluation               AM-PAC PT "6 Clicks" Mobility  Outcome Measure Help needed turning from your back to your side while in a flat bed without using bedrails?: None Help needed moving from lying on your back to sitting on the side of a flat bed without using bedrails?: None Help needed moving to and from a bed to a chair (including a wheelchair)?: A Little Help needed standing up from a chair using your arms (e.g., wheelchair or bedside chair)?: A Little Help needed to walk in hospital room?: A Little Help needed climbing 3-5 steps with a railing? :  A Lot 6 Click Score: 19    End of Session Equipment Utilized During Treatment: Gait belt Activity Tolerance: Patient tolerated treatment well Patient left: in chair;with call bell/phone within reach Nurse Communication: Mobility status PT Visit Diagnosis: Muscle weakness (generalized) (M62.81)    Time: 1610-9604 PT Time Calculation (min) (ACUTE ONLY): 21 min   Charges:   PT Evaluation $PT Eval Low Complexity: [place], PT CLT [phone]  03/03/2022, 1:51 PM

## 2022-03-03 NOTE — Consult Note (Signed)
Cardiology Consultation   Patient ID: PEJA Misty Edwards MRN: [id number]; DOB: 1926-04-18  Admit date: 03/01/2022 Date of Consult: 03/03/2022  PCP:  Misty Frizzle, MD   [place] [place] [facility] Cardiologist:  Carlyle Dolly, MD        Patient Profile:   Misty Edwards is a 88 y.o. female with a hx of CAD (s/p CABG in 08/2014 with LIMA-LAD, SVG-OM and SVG-PDA), HTN, HLD, moderate MR and Stage 3 CKD who is being seen 03/03/2022 for the evaluation of new-onset atrial fibrillation at the request of Dr. Aileen Fass.  History of Present Illness:   Misty Edwards was last examined Dr. Harl Bowie in 11/2021 and denied any recent anginal symptoms at that time. By review of notes, he had allowed for lenient BPs given her chronic orthostatic symptoms and recurrent falls. She was continued on her current cardiac medications including Amlodipine 5 mg daily, ASA 81 mg daily and Crestor 5 mg daily.  She presented to [facility] ED on 03/01/2022 for evaluation of dyspnea, fatigue and weakness for the past 2 weeks. Initial labs showed WBC 9.2, Hgb 12.6, platelets 203, Na+ 125, K+ 3.9 and creatinine 0.86.  BNP 676. Initial and repeat troponin values negative at 10 and 12. TSH 0.673. Mg 2.0. Negative for COVID and Influenza. CXR showed interstitial markings bilaterally with streaky bibasilar opacities and possible trace pleural effusion. EKG showed new-onset atrial fibrillation, HR 100 with PVC's.   She was admitted for an acute CHF exacerbation and initially started on IV Lasix but this has since been discontinued due to intermittent nausea and vomiting. Recorded net output of -1.1 L thus far and weight down from 159 lbs to 153 lbs. Was also started on PO Lopressor 25 mg twice daily for rate control with this being titrated to 50 mg twice daily starting today. Has also been started on full-dose Lovenox for anticoagulation. Echo this admission shows a preserved EF of 60-65% with the apical septal segment  being hypokinetic. Also noted to have moderate asymmetric LVH, mildly reduced RV function, moderately dilated LA and at least moderate MR  In talking with the patient today, she reports still feeling weak and having nausea and vomiting. Denies any current chest pain or palpitations but says she was having intermittent palpitations prior to admission. Reports her urine output had decreased prior to admission and she noticed swelling in her legs and took extra Lasix. No specific orthopnea or PND.     Past Medical History:  Diagnosis Date   Acute biliary pancreatitis 07/2002   thia was in 07/2004:she still has pseudocyst in tail of pancreas measuring 54 x 33 mm    Adrenal adenoma    bilateral   Anxiety    CAD (coronary artery disease)    a. s/p CABG in 08/2014 with LIMA-LAD, SVG-OM and SVG-PDA   Chronic pancreatitis ([place])    Detached retina    Diabetes mellitus ([place])    Diverticulosis    Heartburn    History of carcinoma in situ of breast 1988   Hyperlipidemia    Hypertension    IBS (irritable bowel syndrome)    Legally blind    Osteoarthritis    Osteopenia    Upper GI bleed September2004   secondary to gastritis    Past Surgical History:  Procedure Laterality Date   CARDIAC CATHETERIZATION N/A 08/24/2014   Procedure: Left Heart Cath and Coronary Angiography;  Surgeon: Leonie Man, MD;  Location: [facility] CV [facility];  Service: Cardiovascular;  Laterality: N/A;   COLONOSCOPY  08/15/05   few tiny diverticula at sigmoid colon/external hemorrhoids but no polyps   COLONOSCOPY  01/08/2012   QJJ:HERDEYC diverticulosis. Next colonoscopy in 12/2016   CORONARY ARTERY BYPASS GRAFT N/A 08/24/2014   Procedure: CORONARY ARTERY BYPASS GRAFTING (CABG) x three, using left internal mammary artery and right leg greater saphenous vein harvested endoscopically;  Surgeon: Ivin Poot, MD;  Location: [facility];  Service: Open Heart Surgery;  Laterality: N/A;   ECTOPIC PREGNANCY SURGERY  1950's   ERCP  with sphincterotomy  07/2002   ESOPHAGOGASTRODUODENOSCOPY      Gastritis of body.  Otherwise normal   ESOPHAGOGASTRODUODENOSCOPY  01/08/2012   RMR: Few scattered gastric erosions of uncertain significance-status post biopsy. Minimal chronic inflammation, no H.pylori   LAPAROSCOPIC CHOLECYSTECTOMY     MASTECTOMY Right 1980   PANCREATIC PSEUDOCYST DRAINAGE  [id number]   drained percutaneously    right mastectomy     tacking up of her bladder     TEE WITHOUT CARDIOVERSION N/A 08/24/2014   Procedure: TRANSESOPHAGEAL ECHOCARDIOGRAM (TEE);  Surgeon: Ivin Poot, MD;  Location: [facility];  Service: Open Heart Surgery;  Laterality: N/A;     Home Medications:  Prior to Admission medications   Medication Sig Start Date End Date Taking? Authorizing Provider  amLODipine (NORVASC) 5 MG tablet TAKE 1 TABLET BY MOUTH ONCE DAILY . APPOINTMENT REQUIRED FOR FUTURE REFILLS Patient taking differently: Take 5 mg by mouth daily. 02/27/22  Yes BranchAlphonse Guild, MD  aspirin EC 81 MG EC tablet Take 1 tablet (81 mg total) by mouth daily. 08/31/14  Yes Gold, Wayne E, PA-C  calcium carbonate (TUMS - DOSED IN MG ELEMENTAL CALCIUM) 500 MG chewable tablet Chew 3 tablets by mouth at bedtime.   Yes [provider]  CREON 24000-76000 units CPEP TAKE 2 CAPSULES BY MOUTH WITH MEALS AND 1 CAP WITH SNACKS x 2 06/26/20  Yes Pickard, Cammie Mcgee, MD  EQ ALLERGY RELIEF, CETIRIZINE, 10 MG tablet Take 1 tablet by mouth once daily 02/27/22  Yes Misty Frizzle, MD  ferrous sulfate 325 (65 FE) MG tablet Take 325 mg by mouth daily with breakfast.   Yes [provider]  FLUoxetine (PROZAC) 20 MG capsule Take 1 capsule (20 mg total) by mouth daily. 07/05/21  Yes Misty Frizzle, MD  furosemide (LASIX) 20 MG tablet Take 1 tablet by mouth once daily 12/17/21  Yes Pickard, Cammie Mcgee, MD  gabapentin (NEURONTIN) 100 MG capsule TAKE 1 CAPSULE BY MOUTH THREE TIMES DAILY AS NEEDED FOR NERVE PAIN Patient taking differently: Take 100  mg by mouth 3 (three) times daily as needed (nerve pain). 01/30/22  Yes Misty Frizzle, MD  insulin NPH-regular Human (NOVOLIN 70/30 RELION) (70-30) 100 UNIT/ML injection INJECT 25 UNITS SUBCUTANEOUSLY IN THE MORNING AND 15 IN THE EVENING Patient taking differently: Inject into the skin See admin instructions. INJECT 42 UNITS SUBCUTANEOUSLY IN THE MORNING AND 22 IN THE EVENING 09/28/19  Yes Pickard, Cammie Mcgee, MD  LORazepam (ATIVAN) 1 MG tablet TAKE 1/2 TO 1 (ONE-HALF TO ONE) TABLET BY MOUTH TWICE DAILY AS NEEDED Patient taking differently: Take 1 mg by mouth See admin instructions. TAKE 1/2 TO 1 (ONE-HALF TO ONE) TABLET BY MOUTH TWICE DAILY AS NEEDED 02/28/22  Yes Pickard, Cammie Mcgee, MD  MAGNESIUM-OXIDE 400 (240 Mg) MG tablet TAKE 1 TABLET BY MOUTH ONCE DAILY . APPOINTMENT REQUIRED FOR FUTURE REFILLS Patient taking differently: Take 400 mg by mouth daily. 12/23/21  Yes Branch,  Alphonse Guild, MD  metoprolol tartrate (LOPRESSOR) 25 MG tablet Take 1 tablet by mouth twice daily 01/14/22  Yes Branch, Alphonse Guild, MD  Multiple Vitamin (MULTIVITAMIN) capsule Take 1 capsule by mouth every morning.    Yes [provider]  pantoprazole (PROTONIX) 40 MG tablet Take 1 tablet by mouth once daily 11/29/21  Yes Branch, Alphonse Guild, MD  potassium chloride (KLOR-CON) 10 MEQ tablet TAKE 1 TABLET BY MOUTH ONCE DAILY AS NEEDED (TAKE WITH FUROSEMIDE) Patient taking differently: Take 10 mEq by mouth See admin instructions. TAKE 1 TABLET BY MOUTH ONCE DAILY AS NEEDED (TAKE  WITH  FUROSEMIDE) 04/18/21  Yes Misty Frizzle, MD  rosuvastatin (CRESTOR) 5 MG tablet Take 1 tablet by mouth once daily 12/16/21  Yes Branch, Alphonse Guild, MD  triamcinolone cream (KENALOG) 0.1 % Apply topically 2 (two) times daily. 01/10/22  Yes Misty Frizzle, MD  BD INSULIN SYRINGE U/F 31G X 5/16" 0.5 ML MISC USE AS DIRECTED TWICE DAILY 02/17/22   Misty Frizzle, MD  Blood Glucose Monitoring Suppl (BLOOD GLUCOSE SYSTEM PAK) KIT Please dispense  as One Touch Ultra. Use as directed to monitor FSBS 4x daily. Dx: E11.65 11/16/20   Misty Frizzle, MD  Lancets Misc. MISC Pt has Accu Chek Aviva Plus - Needs just lancets  Checks BS 4-5 times per day. Disp 3 boxes(90 day Supply)/4 refills Dx code. E11.9 10/13/19   Misty Frizzle, MD  OneTouch Delica Lancets 25Z MISC CHECK BLOOD SUGAR 4-5 TIMES PER DAY 10/22/20   Misty Frizzle, MD  [facility] ULTRA test strip USE 1 STRIP TO CHECK GLUCOSE FIVE TIMES DAILY 01/28/22   Misty Frizzle, MD    Inpatient Medications: Scheduled Meds:  amLODipine  5 mg Oral Daily   enoxaparin (LOVENOX) injection  70 mg Subcutaneous Q12H   FLUoxetine  20 mg Oral Daily   insulin aspart  0-15 Units Subcutaneous TID WC   insulin aspart  0-5 Units Subcutaneous QHS   insulin glargine-yfgn  10 Units Subcutaneous BID   lipase/protease/amylase  48,000 Units Oral TID WC   metoprolol tartrate  50 mg Oral BID   pantoprazole  40 mg Oral Daily   potassium chloride  40 mEq Oral BID   rosuvastatin  5 mg Oral Daily   Continuous Infusions:  sodium chloride     PRN Meds: acetaminophen, alum & mag hydroxide-simeth, gabapentin, LORazepam, ondansetron **OR** ondansetron (ZOFRAN) IV  Allergies:    Allergies  Allergen Reactions   Calcium-Containing Compounds Nausea Only   Morphine And Related Other (See Comments)    Hallucination   Raloxifene Itching    Evista- Face and eyes burning   Vitamin D Analogs Nausea Only    Social History:   Social History   Socioeconomic History   Marital status: Widowed    Spouse name: Not on file   Number of children: 5   Years of education: Not on file   Highest education level: Not on file  Occupational History   Occupation: homemaker  Tobacco Use   Smoking status: Never   Smokeless tobacco: Never  Vaping Use   Vaping Use: Never used  Substance and Sexual Activity   Alcohol use: No    Alcohol/week: 0.0 standard drinks of alcohol   Drug use: No   Sexual activity: Not  Currently    Birth control/protection: Post-menopausal  Other Topics Concern   Not on file  Social History Narrative   Lives in [place].   Social Determinants of Health  Financial Resource Strain: Not on file  Food Insecurity: No Food Insecurity (03/02/2022)   Hunger Vital Sign    Worried About Running Out of Food in the Last Year: Never true    Ran Out of Food in the Last Year: Never true  Transportation Needs: No Transportation Needs (03/02/2022)   PRAPARE - Hydrologist (Medical): No    Lack of Transportation (Non-Medical): No  Physical Activity: Not on file  Stress: Not on file  Social Connections: Not on file  Intimate Partner Violence: Not At Risk (03/02/2022)   Humiliation, Afraid, Rape, and Kick questionnaire    Fear of Current or Ex-Partner: No    Emotionally Abused: No    Physically Abused: No    Sexually Abused: No    Family History:    Family History  Problem Relation Age of Onset   Ovarian cancer Mother        passed   Colon cancer Father 65       passed away in his 39's   Colon cancer Brother 64       surgery inhis 45's doing well now   Heart attack Brother        passed away age 64   Pancreatitis Brother    Kidney disease Daughter    Leukemia Son      ROS:  Please see the history of present illness.   All other ROS reviewed and negative.     Physical Exam/Data:   Vitals:   03/02/22 1403 03/02/22 2157 03/03/22 0352 03/03/22 0500  BP: (!) 142/67 (!) 178/73 (!) 148/91   Pulse: 82 80 83   Resp: 12 (!) 24 (!) 21   Temp: 98.2 F (36.8 C) 98 F (36.7 C) 97.9 F (36.6 C)   TempSrc: Oral     SpO2: 94% 96% 97%   Weight:    69.5 kg  Height:        Intake/Output Summary (Last 24 hours) at 03/03/2022 0953 Last data filed at 03/03/2022 0500 Gross per 24 hour  Intake 120 ml  Output 400 ml  Net -280 ml      03/03/2022    5:00 AM 03/02/2022    5:00 AM 03/02/2022    1:03 AM  Last 3 Weights  Weight (lbs) 153  lb 3.5 oz 154 lb 12.2 oz 154 lb 8.7 oz  Weight (kg) 69.5 kg 70.2 kg 70.1 kg     Body mass index is 27.14 kg/m.  General: Pleasant, elderly female appearing in no acute distress.  HEENT: normal Neck: no JVD Vascular: No carotid bruits; Distal pulses 2+ bilaterally Cardiac:  normal S1, S2; Irregularly irregular. 2/6 systolic murmur along Apex.  Lungs:  clear to auscultation bilaterally, no wheezing, rhonchi or rales  Abd: soft, nontender, no hepatomegaly  Ext: no pitting edema Musculoskeletal:  No deformities, BUE and BLE strength normal and equal Skin: warm and dry  Neuro:  CNs 2-12 intact, no focal abnormalities noted Psych:  Normal affect   EKG:  The EKG was personally reviewed and demonstrates: New-onset atrial fibrillation, HR 100 with PVC's.  Telemetry:  Telemetry was personally reviewed and demonstrates: Rate-controlled atrial fibrillation, HR in 60's to 90's with occasional PVC's.   Relevant CV Studies:  Echocardiogram: 03/02/2022 IMPRESSIONS     1. Left ventricular ejection fraction, by estimation, is 60 to 65%. The  left ventricle has normal function. The left ventricle demonstrates  regional wall motion abnormalities (see scoring diagram/findings for  description).  There is moderate asymmetric  left ventricular hypertrophy of the basal-septal segment. Left ventricular  diastolic parameters are indeterminate.   2. Right ventricular systolic function is mildly reduced. The right  ventricular size is normal. There is normal pulmonary artery systolic  pressure. The estimated right ventricular systolic pressure is 63.1 mmHg.   3. Left atrial size was moderately dilated.   4. At least moderate mitral regurgitation with horizontal color splay and  with Doppler evidnece of right sided pulmonary vein flow reversal. The  mitral valve is normal in structure. Moderate mitral valve regurgitation.   5. The aortic valve is tricuspid. Aortic valve regurgitation is trivial.  No  aortic stenosis is present.   Comparison(s): Mitral regurgitation has increased.    Laboratory Data:  High Sensitivity Troponin:   Recent Labs  Lab 03/01/22 1625 03/01/22 1828  TROPONINIHS 10 12     Chemistry Recent Labs  Lab 03/01/22 1625 03/01/22 1828 03/02/22 0647 03/03/22 0611  NA 125*  --  130* 129*  K 3.9  --  3.7 4.5  CL 88*  --  88* 92*  CO2 25  --  30 30  GLUCOSE 182*  --  190* 174*  BUN 16  --  17 18  CREATININE 0.86  --  0.88 0.93  CALCIUM 9.1  --  8.7* 8.6*  MG  --  2.0  --   --   GFRNONAA >60  --  >60 57*  ANIONGAP 12  --  12 7    No results for input(s): "PROT", "ALBUMIN", "AST", "ALT", "ALKPHOS", "BILITOT" in the last 168 hours. Lipids No results for input(s): "CHOL", "TRIG", "HDL", "LABVLDL", "LDLCALC", "CHOLHDL" in the last 168 hours.  Hematology Recent Labs  Lab 03/01/22 1625 03/02/22 0647  WBC 9.2 6.8  RBC 4.43 4.11  HGB 12.6 11.8*  HCT 37.7 35.0*  MCV 85.1 85.2  MCH 28.4 28.7  MCHC 33.4 33.7  RDW 13.9 14.0  PLT 203 174   Thyroid  Recent Labs  Lab 03/01/22 1828  TSH 0.673    BNP Recent Labs  Lab 03/01/22 1625  BNP 676.0*    DDimer No results for input(s): "DDIMER" in the last 168 hours.   Radiology/Studies:    DG Chest Port 1 View  Result Date: 03/01/2022 CLINICAL DATA:  Shortness of breath EXAM: PORTABLE CHEST 1 VIEW COMPARISON:  09/25/2019 FINDINGS: Enlargement of the cardiac silhouette, increased from prior. Prior sternotomy and CABG. Diffusely prominent interstitial markings bilaterally with streaky bibasilar opacities. Possible trace pleural effusions. No pneumothorax. IMPRESSION: 1. Findings suggestive of CHF with pulmonary edema. 2. Enlargement of the cardiac silhouette, increased from prior. Underlying pericardial effusion not excluded. Electronically Signed   By: Davina Poke D.O.   On: 03/01/2022 15:40     Assessment and Plan:   1. New-Onset Atrial Fibrillation - New diagnosis for the patient this admission  and while HR is currently in the 110's to 120's in the setting of active nausea and vomiting, HR was in the 60's to 90's yesterday. Agree with titration of Lopressor from 16m BID to 512mBID and can hold Amlodipine if BP becomes a limiting factor in titration of AV nodal blocking agents.  - Her CHA2DS2-VASc Score is at least 5 and she is currently receiving full-dose Lovenox. Will ask for Pharmacy's assistance with switching to Eliquis since she did receive Lovenox this AM. Previously had frequent falls but no recent issues.  - Given her age, would anticipate rate-control for now. Can consider a  DCCV as an outpatient following 3 weeks of uninterrupted anticoagulation if rates are poorly controlled or she is symptomatic.   2. CAD - She is s/p CABG in 08/2014 with LIMA-LAD, SVG-OM and SVG-PDA. She denies any recent anginal symptoms and Hs Troponin values have been negative this admission.  - Continue Crestor 49m daily and Lopressor with dose adjustment as outlined above. Will stop ASA given the need for anticoagulation.   3. Acute HFpEF - Baseline weight 151 - 152 lbs and at 153 lbs today. Overall -1.1 L thus far and agree with holding additional diuretics for now given her nausea, vomiting and minimal PO intake.   4. HTN - Her BP has been elevated at 142/67 - 178/73 within the past 24 hours. Remains on Amlodipine 581mdaily and Lopressor was just titrated to 5049mID this morning.   5. HLD - LDL was at 36 in 11/2021. Remains on Crestor 5mg46mily.   6. Mitral Regurgitation - Moderate by echo this admission. Continue to follow as an outpatient. Would anticipate conservative management given her advanced age.   7. Nausea/Vomiting - Management per the admitting team.    Risk Assessment/Risk Scores:   CHA2DS2-VASc Score = 5   This indicates a 7.2% annual risk of stroke. The patient's score is based upon: CHF History: 0 HTN History: 1 Diabetes History: 0 Stroke History: 0 Vascular Disease  History: 1 Age Score: 2 Gender Score: 1    For questions or updates, please contact [facility] consult [email or web address] for contact info under    Signed, BritErma Heritage-C  03/03/2022 9:53 AM

## 2022-03-04 DIAGNOSIS — I25810 Atherosclerosis of coronary artery bypass graft(s) without angina pectoris: Secondary | ICD-10-CM

## 2022-03-04 DIAGNOSIS — E871 Hypo-osmolality and hyponatremia: Secondary | ICD-10-CM | POA: Diagnosis not present

## 2022-03-04 DIAGNOSIS — E875 Hyperkalemia: Secondary | ICD-10-CM | POA: Diagnosis not present

## 2022-03-04 DIAGNOSIS — I4891 Unspecified atrial fibrillation: Secondary | ICD-10-CM | POA: Diagnosis not present

## 2022-03-04 LAB — GLUCOSE, CAPILLARY
Glucose-Capillary: 213 mg/dL — ABNORMAL HIGH (ref 70–99)
Glucose-Capillary: 417 mg/dL — ABNORMAL HIGH (ref 70–99)
Glucose-Capillary: 282 mg/dL — ABNORMAL HIGH (ref 70–99)
Glucose-Capillary: 149 mg/dL — ABNORMAL HIGH (ref 70–99)
Glucose-Capillary: 117 mg/dL — ABNORMAL HIGH (ref 70–99)

## 2022-03-04 LAB — GLUCOSE, RANDOM: Glucose, Bld: 311 mg/dL — ABNORMAL HIGH (ref 70–99)

## 2022-03-04 LAB — BASIC METABOLIC PANEL
Anion gap: 9 (ref 5–15)
BUN: 21 mg/dL (ref 8–23)
CO2: 26 mmol/L (ref 22–32)
Calcium: 9.1 mg/dL (ref 8.9–10.3)
Chloride: 97 mmol/L — ABNORMAL LOW (ref 98–111)
Creatinine, Ser: 0.94 mg/dL (ref 0.44–1.00)
GFR, Estimated: 57 mL/min — ABNORMAL LOW (ref >60)
Glucose, Bld: 211 mg/dL — ABNORMAL HIGH (ref 70–99)
Potassium: 5.7 mmol/L — ABNORMAL HIGH (ref 3.5–5.1)
Sodium: 132 mmol/L — ABNORMAL LOW (ref 135–145)

## 2022-03-04 MED ORDER — CLOTRIMAZOLE 1 % VA CREA
1.0000 | TOPICAL_CREAM | Freq: Every day | VAGINAL | Status: DC
  Filled 2022-03-04: qty 45

## 2022-03-04 MED ORDER — FUROSEMIDE 20 MG PO TABS
20.0000 mg | ORAL_TABLET | Freq: Every day | ORAL | Status: DC
  Administered 2022-03-04 – 2022-03-05 (×2): 20 mg via ORAL
  Filled 2022-03-04 (×2): qty 1

## 2022-03-04 MED ORDER — INSULIN GLARGINE-YFGN 100 UNIT/ML ~~LOC~~ SOLN
20.0000 [IU] | Freq: Two times a day (BID) | SUBCUTANEOUS | Status: DC
  Administered 2022-03-04: 20 [IU] via SUBCUTANEOUS
  Filled 2022-03-04 (×3): qty 0.2

## 2022-03-04 MED ORDER — INSULIN ASPART 100 UNIT/ML IJ SOLN
6.0000 [IU] | Freq: Three times a day (TID) | INTRAMUSCULAR | Status: DC
  Administered 2022-03-04 (×2): 6 [IU] via SUBCUTANEOUS

## 2022-03-04 MED ORDER — INSULIN GLARGINE-YFGN 100 UNIT/ML ~~LOC~~ SOLN
30.0000 [IU] | Freq: Two times a day (BID) | SUBCUTANEOUS | Status: DC
  Administered 2022-03-05: 30 [IU] via SUBCUTANEOUS
  Filled 2022-03-04 (×4): qty 0.3

## 2022-03-04 MED ORDER — INSULIN GLARGINE-YFGN 100 UNIT/ML ~~LOC~~ SOLN
10.0000 [IU] | Freq: Once | SUBCUTANEOUS | Status: AC
  Administered 2022-03-04: 10 [IU] via SUBCUTANEOUS
  Filled 2022-03-04: qty 0.1

## 2022-03-04 MED ORDER — SODIUM POLYSTYRENE SULFONATE 15 GM/60ML PO SUSP
30.0000 g | Freq: Once | ORAL | Status: AC
  Administered 2022-03-04: 30 g via ORAL
  Filled 2022-03-04: qty 120

## 2022-03-04 NOTE — Progress Notes (Signed)
[facility] PROGRESS NOTE    Progress Note  Misty Edwards  VEL:[id number] DOB: March 19, 1926 DOA: 03/01/2022 PCP: Susy Frizzle, MD     Brief Narrative:   Misty Edwards is an 88 y.o. female past medical history significant for coronary artery disease, diabetes mellitus type 2, chronic diastolic heart failure comes in for worsening shortness of breath that started several days prior to admission   Assessment/Plan:    New onset atrial fibrillation with RVR: Currently rate controlled.  Allow her diet Continue home metoprolol, on oral Eliquis. Cardiology recommended to discharge without Eliquis due to high risk of falls. They preefer Aspirin.  Chronic diastolic HF (congestive heart failure) (HCC) Blood pressure has remained relatively stable. Acute heart failure has been ruled out. Her estimated dry weight is around 67 to 69 kg. She appears euvolemic on physical exam, these findings can probably be due to A-fib with RVR. Resume her home dose of Lasix.  Nausea and vomiting overnight: Was given Zofran and improved. Has remained afebrile no leukocytosis she denies any diarrhea.  Blood pressure is stable. Denies any abdominal pain she is on Protonix. KVO IV fluid nausea has resolved along with diet.  Hyperkalemia: Give oral Kayexalate recheck basic metabolic panel tomorrow morning.  Hypervolemic hyponatremia: Improved with rate control.  Normocytic anemia: She relates no signs of bleeding continue aspirin, she is currently on Lovenox.  Type 2 diabetes mellitus with vascular disease and hyperglycemia: She was started on long-acting insulin blood glucose 182 230. Will add sliding scale insulin. A1c 6.0  Essential hypertension Blood pressure significantly stable. Continue Lovenox and metoprolol.  Will resume home dose of Norvasc. GERD Continue Protonix.  S/P CABG x 3 Continue aspirin.   DVT prophylaxis: lovenox Family Communication:none Status is:  Inpatient Remains inpatient appropriate because: Acute preserve heart failure    Code Status:     Code Status Orders  (From admission, onward)           Start     Ordered   03/01/22 1934  Full code  Continuous       Question:  By:  Answer:  Consent: discussion documented in EHR   03/01/22 1938           Code Status History     Date Active Date Inactive Code Status Order ID Comments User Context   09/23/2019 2311 09/26/2019 1944 Full Code [id number]  Albertine Patricia, MD Inpatient   09/13/2018 1408 09/23/2018 1729 Full Code [id number]  Murlean Iba, MD Inpatient   09/03/2018 1109 09/06/2018 2122 Full Code [id number]  Lavina Hamman, MD Inpatient   10/23/2016 1642 10/24/2016 2021 Full Code [id number]  Kathie Dike, MD Inpatient   09/27/2016 0520 09/28/2016 2217 Full Code [id number]  Orvan Falconer, MD Inpatient   08/25/2014 0052 08/31/2014 1813 Full Code [id number]  John Giovanni, PA-C Inpatient         IV Access:   Peripheral IV   Procedures and diagnostic studies:   ECHOCARDIOGRAM COMPLETE  Result Date: 03/02/2022    ECHOCARDIOGRAM REPORT   Patient Name:   Misty Edwards Date of Exam: 03/02/2022 Medical Rec #:  [id number]       Height:       63.0 in Accession #:    [id number]      Weight:       154.8 lb Date of Birth:  05-Mar-1927       BSA:          1.734 m Patient  Age:    88 years        BP:           148/71 mmHg Patient Gender: F               HR:           76 bpm. Exam Location:  Inpatient Procedure: 2D Echo, Cardiac Doppler and Color Doppler Indications:    I50.40* Unspecified combined systolic (congestive) and diastolic                 (congestive) heart failure; I48.91* Unspeicified atrial                 fibrillation  History:        Patient has prior history of Echocardiogram examinations, most                 recent 09/25/2019. CHF, CAD and Previous Myocardial Infarction,                 Abnormal ECG and Prior CABG, Arrythmias:Atrial Fibrillation;                 Risk  Factors:Diabetes and Dyslipidemia. Cancer.  Sonographer:    Roseanna Rainbow RDCS Referring Phys: [phone] JACOB J STINSON  Sonographer Comments: Technically difficult study due to poor echo windows. IMPRESSIONS  1. Left ventricular ejection fraction, by estimation, is 60 to 65%. The left ventricle has normal function. The left ventricle demonstrates regional wall motion abnormalities (see scoring diagram/findings for description). There is moderate asymmetric left ventricular hypertrophy of the basal-septal segment. Left ventricular diastolic parameters are indeterminate.  2. Right ventricular systolic function is mildly reduced. The right ventricular size is normal. There is normal pulmonary artery systolic pressure. The estimated right ventricular systolic pressure is 51.0 mmHg.  3. Left atrial size was moderately dilated.  4. At least moderate mitral regurgitation with horizontal color splay and with Doppler evidnece of right sided pulmonary vein flow reversal. The mitral valve is normal in structure. Moderate mitral valve regurgitation.  5. The aortic valve is tricuspid. Aortic valve regurgitation is trivial. No aortic stenosis is present. Comparison(s): Mitral regurgitation has increased. FINDINGS  Left Ventricle: Left ventricular ejection fraction, by estimation, is 60 to 65%. The left ventricle has normal function. The left ventricle demonstrates regional wall motion abnormalities. The left ventricular internal cavity size was normal in size. There is moderate asymmetric left ventricular hypertrophy of the basal-septal segment. Left ventricular diastolic parameters are indeterminate.  LV Wall Scoring: The apical septal segment is hypokinetic. Right Ventricle: The right ventricular size is normal. No increase in right ventricular wall thickness. Right ventricular systolic function is mildly reduced. There is normal pulmonary artery systolic pressure. The tricuspid regurgitant velocity is 2.44 m/s, and with an assumed  right atrial pressure of 8 mmHg, the estimated right ventricular systolic pressure is 25.8 mmHg. Left Atrium: Left atrial size was moderately dilated. Right Atrium: Right atrial size was normal in size. Pericardium: There is no evidence of pericardial effusion. Mitral Valve: At least moderate mitral regurgitation with horizontal color splay and with Doppler evidnece of right sided pulmonary vein flow reversal. The mitral valve is normal in structure. Moderate mitral valve regurgitation. MV peak gradient, 10.1 mmHg. The mean mitral valve gradient is 3.0 mmHg. Tricuspid Valve: The tricuspid valve is normal in structure. Tricuspid valve regurgitation is trivial. No evidence of tricuspid stenosis. Aortic Valve: The aortic valve is tricuspid. Aortic valve regurgitation is trivial. No aortic stenosis is present.  Pulmonic Valve: The pulmonic valve was not well visualized. Pulmonic valve regurgitation is mild. No evidence of pulmonic stenosis. Aorta: The aortic root and ascending aorta are structurally normal, with no evidence of dilitation. IAS/Shunts: No atrial level shunt detected by color flow Doppler.  LEFT VENTRICLE PLAX 2D LVIDd:         4.10 cm LVIDs:         2.60 cm LV PW:         0.90 cm LV IVS:        1.10 cm LVOT diam:     2.30 cm LV SV:         83 LV SV Index:   48 LVOT Area:     4.15 cm  RIGHT VENTRICLE            IVC RV S prime:     5.87 cm/s  IVC diam: 2.30 cm TAPSE (M-mode): 0.7 cm LEFT ATRIUM             Index        RIGHT ATRIUM           Index LA diam:        4.40 cm 2.54 cm/m   RA Area:     15.70 cm LA Vol (A2C):   56.0 ml 32.30 ml/m  RA Volume:   38.30 ml  22.09 ml/m LA Vol (A4C):   56.6 ml 32.61 ml/m LA Biplane Vol: 68.1 ml 39.27 ml/m  AORTIC VALVE             PULMONIC VALVE LVOT Vmax:   114.00 cm/s PR End Diast Vel: 3.00 msec LVOT Vmean:  75.700 cm/s LVOT VTI:    0.199 m  AORTA Ao Root diam: 2.60 cm Ao Asc diam:  2.50 cm MITRAL VALVE                  TRICUSPID VALVE MV Area (PHT): 3.99 cm        TR Peak grad:   23.8 mmHg MV Area VTI:   2.41 cm       TR Vmax:        244.00 cm/s MV Peak grad:  10.1 mmHg MV Mean grad:  3.0 mmHg       SHUNTS MV Vmax:       1.59 m/s       Systemic VTI:  0.20 m MV Vmean:      82.1 cm/s      Systemic Diam: 2.30 cm MV Decel Time: 190 msec MR Peak grad:    83.5 mmHg MR Mean grad:    58.0 mmHg MR Vmax:         457.00 cm/s MR Vmean:        355.0 cm/s MR PISA:         1.01 cm MR PISA Eff ROA: 9 mm MR PISA Radius:  0.40 cm MV E velocity: 142.00 cm/s Rudean Haskell MD Electronically signed by Rudean Haskell MD Signature Date/Time: 03/02/2022/6:51:46 PM    Final      Medical Consultants:   None.   Subjective:    Misty Edwards nausea and vomiting is resolved.  Objective:    Vitals:   03/03/22 1550 03/03/22 2016 03/03/22 2031 03/04/22 0616  BP: (!) 174/81 (!) 143/90  (!) 140/93  Pulse: 78 90  80  Resp: '19 16  18  '$ Temp: (!) 97.4 F (36.3 C) 98.4 F (36.9 C)  (!) 97.4 F (36.3 C)  TempSrc: Oral  Oral  Oral  SpO2: 95% (!) 88% 92% 100%  Weight:    71.6 kg  Height:       SpO2: 100 % O2 Flow Rate (L/min): 3.5 L/min FiO2 (%): 21 %   Intake/Output Summary (Last 24 hours) at 03/04/2022 0815 Last data filed at 03/03/2022 1500 Gross per 24 hour  Intake 442.06 ml  Output --  Net 442.06 ml    Filed Weights   03/02/22 0500 03/03/22 0500 03/04/22 0616  Weight: 70.2 kg 69.5 kg 71.6 kg    Exam: General exam: In no acute distress. Respiratory system: Good air movement and clear to auscultation. Cardiovascular system: S1 & S2 heard, RRR. No JVD. Gastrointestinal system: Abdomen is nondistended, soft and nontender.  Extremities: No pedal edema. Skin: No rashes, lesions or ulcers Psychiatry: Judgement and insight appear normal. Mood & affect appropriate.  Data Reviewed:    Labs: Basic Metabolic Panel: Recent Labs  Lab 03/01/22 1625 03/01/22 1828 03/02/22 0647 03/03/22 0611 03/04/22 0453  NA 125*  --  130* 129* 132*  K 3.9   --  3.7 4.5 5.7*  CL 88*  --  88* 92* 97*  CO2 25  --  '30 30 26  '$ GLUCOSE 182*  --  190* 174* 211*  BUN 16  --  '17 18 21  '$ CREATININE 0.86  --  0.88 0.93 0.94  CALCIUM 9.1  --  8.7* 8.6* 9.1  MG  --  2.0  --   --   --     GFR Estimated Creatinine Clearance: 35.5 mL/min (by C-G formula based on SCr of 0.94 mg/dL). Liver Function Tests: No results for input(s): "AST", "ALT", "ALKPHOS", "BILITOT", "PROT", "ALBUMIN" in the last 168 hours. No results for input(s): "LIPASE", "AMYLASE" in the last 168 hours. No results for input(s): "AMMONIA" in the last 168 hours. Coagulation profile No results for input(s): "INR", "PROTIME" in the last 168 hours. COVID-19 Labs  No results for input(s): "DDIMER", "FERRITIN", "LDH", "CRP" in the last 72 hours.  Lab Results  Component Value Date   SARSCOV2NAA NEGATIVE 03/01/2022   [place] NEGATIVE 09/23/2019   SARSCOV2NAA NOT DETECTED 09/21/2018   SARSCOV2NAA NOT DETECTED 09/15/2018    CBC: Recent Labs  Lab 03/01/22 1625 03/02/22 0647  WBC 9.2 6.8  NEUTROABS 8.0*  --   HGB 12.6 11.8*  HCT 37.7 35.0*  MCV 85.1 85.2  PLT 203 174    Cardiac Enzymes: No results for input(s): "CKTOTAL", "CKMB", "CKMBINDEX", "TROPONINI" in the last 168 hours. BNP (last 3 results) No results for input(s): "PROBNP" in the last 8760 hours. CBG: Recent Labs  Lab 03/03/22 1309 03/03/22 1610 03/03/22 2020 03/03/22 2346 03/04/22 0738  GLUCAP 250* 235* 160* 198* 213*    D-Dimer: No results for input(s): "DDIMER" in the last 72 hours. Hgb A1c: Recent Labs    03/02/22 0750  HGBA1C 6.0*   Lipid Profile: No results for input(s): "CHOL", "HDL", "LDLCALC", "TRIG", "CHOLHDL", "LDLDIRECT" in the last 72 hours. Thyroid function studies: Recent Labs    03/01/22 1828  TSH 0.673    Anemia work up: No results for input(s): "VITAMINB12", "FOLATE", "FERRITIN", "TIBC", "IRON", "RETICCTPCT" in the last 72 hours. Sepsis Labs: Recent Labs  Lab 03/01/22 1625  03/02/22 0647  WBC 9.2 6.8    Microbiology Recent Results (from the past 240 hour(s))  Resp panel by RT-PCR (RSV, Flu A&B, Covid) Anterior Nasal Swab     Status: None   Collection Time: 03/01/22  3:10 PM   Specimen:  Anterior Nasal Swab  Result Value Ref Range Status   SARS Coronavirus 2 by RT PCR NEGATIVE NEGATIVE Final    Comment: (NOTE) SARS-CoV-2 target nucleic acids are NOT DETECTED.  The SARS-CoV-2 RNA is generally detectable in upper respiratory specimens during the acute phase of infection. The lowest concentration of SARS-CoV-2 viral copies this assay can detect is 138 copies/mL. A negative result does not preclude SARS-Cov-2 infection and should not be used as the sole basis for treatment or other patient management decisions. A negative result may occur with  improper specimen collection/handling, submission of specimen other than nasopharyngeal swab, presence of viral mutation(s) within the areas targeted by this assay, and inadequate number of viral copies(<138 copies/mL). A negative result must be combined with clinical observations, patient history, and epidemiological information. The expected result is Negative.  Fact Sheet for Patients:  EntrepreneurPulse.com.au  Fact Sheet for Healthcare Providers:  IncredibleEmployment.be  This test is no t yet approved or cleared by the [place] FDA and  has been authorized for detection and/or diagnosis of SARS-CoV-2 by FDA under an Emergency Use Authorization (EUA). This EUA will remain  in effect (meaning this test can be used) for the duration of the COVID-19 declaration under Section 564(b)(1) of the Act, 21 U.S.C.section 360bbb-3(b)(1), unless the authorization is terminated  or revoked sooner.       Influenza A by PCR NEGATIVE NEGATIVE Final   Influenza B by PCR NEGATIVE NEGATIVE Final    Comment: (NOTE) The Xpert Xpress SARS-CoV-2/FLU/RSV plus assay is intended as an  aid in the diagnosis of influenza from Nasopharyngeal swab specimens and should not be used as a sole basis for treatment. Nasal washings and aspirates are unacceptable for Xpert Xpress SARS-CoV-2/FLU/RSV testing.  Fact Sheet for Patients: EntrepreneurPulse.com.au  Fact Sheet for Healthcare Providers: IncredibleEmployment.be  This test is not yet approved or cleared by the [place] FDA and has been authorized for detection and/or diagnosis of SARS-CoV-2 by FDA under an Emergency Use Authorization (EUA). This EUA will remain in effect (meaning this test can be used) for the duration of the COVID-19 declaration under Section 564(b)(1) of the Act, 21 U.S.C. section 360bbb-3(b)(1), unless the authorization is terminated or revoked.     Resp Syncytial Virus by PCR NEGATIVE NEGATIVE Final    Comment: (NOTE) Fact Sheet for Patients: EntrepreneurPulse.com.au  Fact Sheet for Healthcare Providers: IncredibleEmployment.be  This test is not yet approved or cleared by the [place] FDA and has been authorized for detection and/or diagnosis of SARS-CoV-2 by FDA under an Emergency Use Authorization (EUA). This EUA will remain in effect (meaning this test can be used) for the duration of the COVID-19 declaration under Section 564(b)(1) of the Act, 21 U.S.C. section 360bbb-3(b)(1), unless the authorization is terminated or revoked.  Performed at [facility], [address] [place], [place] [address]   Culture, blood (routine x 2)     Status: None (Preliminary result)   Collection Time: 03/01/22  4:25 PM   Specimen: BLOOD  Result Value Ref Range Status   Specimen Description BLOOD BLOOD RIGHT HAND  Final   Special Requests BLOOD  Final   Culture   Final    NO GROWTH 2 DAYS Performed at [facility], [address] [place], [place] [address]    Report Status PENDING  Incomplete  Culture, blood (routine x 2)      Status: None (Preliminary result)   Collection Time: 03/01/22  4:25 PM   Specimen: BLOOD  Result Value  Ref Range Status   Specimen Description BLOOD BLOOD LEFT HAND  Final   Special Requests BLOOD  Final   Culture   Final    NO GROWTH 2 DAYS Performed at [facility], [address] [place], [place] [address]    Report Status PENDING  Incomplete     Medications:    amLODipine  5 mg Oral Daily   apixaban  5 mg Oral BID   FLUoxetine  20 mg Oral Daily   insulin aspart  0-15 Units Subcutaneous TID WC   insulin aspart  0-5 Units Subcutaneous QHS   insulin aspart  4 Units Subcutaneous TID WC   insulin glargine-yfgn  15 Units Subcutaneous BID   lipase/protease/amylase  48,000 Units Oral TID WC   metoprolol tartrate  50 mg Oral BID   miconazole  1 Applicatorful Vaginal QHS   pantoprazole  40 mg Oral Daily   rosuvastatin  5 mg Oral Daily   sodium polystyrene  30 g Oral Once   Continuous Infusions:    LOS: 3 days   Charlynne Cousins  [facility]  03/04/2022, 8:15 AM

## 2022-03-04 NOTE — Progress Notes (Addendum)
Progress Note  Patient Name: Misty Edwards Date of Encounter: 03/04/2022  Primary Cardiologist: Carlyle Dolly, MD  Subjective   No acute events overnight. Telemetry reviewed and heart rates in the range of 90-100. She wants to go home and does not want to stay in the hospital.  Inpatient Medications    Scheduled Meds:  amLODipine  5 mg Oral Daily   apixaban  5 mg Oral BID   clotrimazole  1 Applicatorful Vaginal QHS   FLUoxetine  20 mg Oral Daily   furosemide  20 mg Oral Daily   insulin aspart  0-15 Units Subcutaneous TID WC   insulin aspart  0-5 Units Subcutaneous QHS   insulin aspart  4 Units Subcutaneous TID WC   insulin glargine-yfgn  20 Units Subcutaneous BID   lipase/protease/amylase  48,000 Units Oral TID WC   metoprolol tartrate  50 mg Oral BID   pantoprazole  40 mg Oral Daily   rosuvastatin  5 mg Oral Daily   sodium polystyrene  30 g Oral Once   Continuous Infusions:  PRN Meds: acetaminophen, alum & mag hydroxide-simeth, gabapentin, LORazepam, ondansetron **OR** ondansetron (ZOFRAN) IV   Vital Signs    Vitals:   03/03/22 1550 03/03/22 2016 03/03/22 2031 03/04/22 0616  BP: (!) 174/81 (!) 143/90  (!) 140/93  Pulse: 78 90  80  Resp: '19 16  18  '$ Temp: (!) 97.4 F (36.3 C) 98.4 F (36.9 C)  (!) 97.4 F (36.3 C)  TempSrc: Oral Oral  Oral  SpO2: 95% (!) 88% 92% 100%  Weight:    71.6 kg  Height:        Intake/Output Summary (Last 24 hours) at 03/04/2022 1009 Last data filed at 03/03/2022 1500 Gross per 24 hour  Intake 442.06 ml  Output --  Net 442.06 ml   Filed Weights   03/02/22 0500 03/03/22 0500 03/04/22 0616  Weight: 70.2 kg 69.5 kg 71.6 kg    Telemetry     Personally reviewed, heart rates in the range of 90-100s and in atrial fibrillation.  ECG    A-fib with RVR  Physical Exam   GEN: No acute distress.   Neck: No JVD. Cardiac: RRR, no murmur, rub, or gallop.  Respiratory: Nonlabored. Clear to auscultation bilaterally. GI:  Soft, nontender, bowel sounds present. MS: No edema; No deformity. Neuro:  Nonfocal. Psych: Alert and oriented x 3. Normal affect.  Labs    Chemistry Recent Labs  Lab 03/02/22 0647 03/03/22 0611 03/04/22 0453  NA 130* 129* 132*  K 3.7 4.5 5.7*  CL 88* 92* 97*  CO2 '30 30 26  '$ GLUCOSE 190* 174* 211*  BUN '17 18 21  '$ CREATININE 0.88 0.93 0.94  CALCIUM 8.7* 8.6* 9.1  GFRNONAA >60 57* 57*  ANIONGAP '12 7 9     '$ Hematology Recent Labs  Lab 03/01/22 1625 03/02/22 0647  WBC 9.2 6.8  RBC 4.43 4.11  HGB 12.6 11.8*  HCT 37.7 35.0*  MCV 85.1 85.2  MCH 28.4 28.7  MCHC 33.4 33.7  RDW 13.9 14.0  PLT 203 174    Cardiac Enzymes Recent Labs  Lab 03/01/22 1625 03/01/22 1828  TROPONINIHS 10 12    BNP Recent Labs  Lab 03/01/22 1625  BNP 676.0*     DDimerNo results for input(s): "DDIMER" in the last 168 hours.   Cardiology Studies    ECHOCARDIOGRAM COMPLETE  Result Date: 03/02/2022    ECHOCARDIOGRAM REPORT   Patient Name:   Misty Edwards Date of  Exam: 03/02/2022 Medical Rec #:  [id number]       Height:       63.0 in Accession #:    [id number]      Weight:       154.8 lb Date of Birth:  08-06-1926       BSA:          1.734 m Patient Age:    88 years        BP:           148/71 mmHg Patient Gender: F               HR:           76 bpm. Exam Location:  Inpatient Procedure: 2D Echo, Cardiac Doppler and Color Doppler Indications:    I50.40* Unspecified combined systolic (congestive) and diastolic                 (congestive) heart failure; I48.91* Unspeicified atrial                 fibrillation  History:        Patient has prior history of Echocardiogram examinations, most                 recent 09/25/2019. CHF, CAD and Previous Myocardial Infarction,                 Abnormal ECG and Prior CABG, Arrythmias:Atrial Fibrillation;                 Risk Factors:Diabetes and Dyslipidemia. Cancer.  Sonographer:    Roseanna Rainbow RDCS Referring Phys: [phone] JACOB J STINSON  Sonographer Comments:  Technically difficult study due to poor echo windows. IMPRESSIONS  1. Left ventricular ejection fraction, by estimation, is 60 to 65%. The left ventricle has normal function. The left ventricle demonstrates regional wall motion abnormalities (see scoring diagram/findings for description). There is moderate asymmetric left ventricular hypertrophy of the basal-septal segment. Left ventricular diastolic parameters are indeterminate.  2. Right ventricular systolic function is mildly reduced. The right ventricular size is normal. There is normal pulmonary artery systolic pressure. The estimated right ventricular systolic pressure is 63.1 mmHg.  3. Left atrial size was moderately dilated.  4. At least moderate mitral regurgitation with horizontal color splay and with Doppler evidnece of right sided pulmonary vein flow reversal. The mitral valve is normal in structure. Moderate mitral valve regurgitation.  5. The aortic valve is tricuspid. Aortic valve regurgitation is trivial. No aortic stenosis is present. Comparison(s): Mitral regurgitation has increased. FINDINGS  Left Ventricle: Left ventricular ejection fraction, by estimation, is 60 to 65%. The left ventricle has normal function. The left ventricle demonstrates regional wall motion abnormalities. The left ventricular internal cavity size was normal in size. There is moderate asymmetric left ventricular hypertrophy of the basal-septal segment. Left ventricular diastolic parameters are indeterminate.  LV Wall Scoring: The apical septal segment is hypokinetic. Right Ventricle: The right ventricular size is normal. No increase in right ventricular wall thickness. Right ventricular systolic function is mildly reduced. There is normal pulmonary artery systolic pressure. The tricuspid regurgitant velocity is 2.44 m/s, and with an assumed right atrial pressure of 8 mmHg, the estimated right ventricular systolic pressure is 49.7 mmHg. Left Atrium: Left atrial size was  moderately dilated. Right Atrium: Right atrial size was normal in size. Pericardium: There is no evidence of pericardial effusion. Mitral Valve: At least moderate mitral regurgitation with horizontal color splay and with Doppler evidnece of  right sided pulmonary vein flow reversal. The mitral valve is normal in structure. Moderate mitral valve regurgitation. MV peak gradient, 10.1 mmHg. The mean mitral valve gradient is 3.0 mmHg. Tricuspid Valve: The tricuspid valve is normal in structure. Tricuspid valve regurgitation is trivial. No evidence of tricuspid stenosis. Aortic Valve: The aortic valve is tricuspid. Aortic valve regurgitation is trivial. No aortic stenosis is present. Pulmonic Valve: The pulmonic valve was not well visualized. Pulmonic valve regurgitation is mild. No evidence of pulmonic stenosis. Aorta: The aortic root and ascending aorta are structurally normal, with no evidence of dilitation. IAS/Shunts: No atrial level shunt detected by color flow Doppler.  LEFT VENTRICLE PLAX 2D LVIDd:         4.10 cm LVIDs:         2.60 cm LV PW:         0.90 cm LV IVS:        1.10 cm LVOT diam:     2.30 cm LV SV:         83 LV SV Index:   48 LVOT Area:     4.15 cm  RIGHT VENTRICLE            IVC RV S prime:     5.87 cm/s  IVC diam: 2.30 cm TAPSE (M-mode): 0.7 cm LEFT ATRIUM             Index        RIGHT ATRIUM           Index LA diam:        4.40 cm 2.54 cm/m   RA Area:     15.70 cm LA Vol (A2C):   56.0 ml 32.30 ml/m  RA Volume:   38.30 ml  22.09 ml/m LA Vol (A4C):   56.6 ml 32.61 ml/m LA Biplane Vol: 68.1 ml 39.27 ml/m  AORTIC VALVE             PULMONIC VALVE LVOT Vmax:   114.00 cm/s PR End Diast Vel: 3.00 msec LVOT Vmean:  75.700 cm/s LVOT VTI:    0.199 m  AORTA Ao Root diam: 2.60 cm Ao Asc diam:  2.50 cm MITRAL VALVE                  TRICUSPID VALVE MV Area (PHT): 3.99 cm       TR Peak grad:   23.8 mmHg MV Area VTI:   2.41 cm       TR Vmax:        244.00 cm/s MV Peak grad:  10.1 mmHg MV Mean grad:  3.0  mmHg       SHUNTS MV Vmax:       1.59 m/s       Systemic VTI:  0.20 m MV Vmean:      82.1 cm/s      Systemic Diam: 2.30 cm MV Decel Time: 190 msec MR Peak grad:    83.5 mmHg MR Mean grad:    58.0 mmHg MR Vmax:         457.00 cm/s MR Vmean:        355.0 cm/s MR PISA:         1.01 cm MR PISA Eff ROA: 9 mm MR PISA Radius:  0.40 cm MV E velocity: 142.00 cm/s Rudean Haskell MD Electronically signed by Rudean Haskell MD Signature Date/Time: 03/02/2022/6:51:46 PM    Final     Assessment & Plan    Patient is a 88 year old F  known to have CAD status post CABG in 2016, HTN, HLD, CKD stage III presented to the ER with generalized weakness, DOE and fatigue x 2 weeks prior to presentation.  EKG showed new onset of atrial fibrillation with RVR with PVCs.  She was admitted to hospitalist team for the management of A-fib with RVR on metoprolol and ADHF on Lasix.  # New onset of atrial fibrillation with RVR, currently proximal rate controlled -Continue metoprolol tartrate 50 mg twice daily. Start amiodarone 200 mg twice daily for 3 weeks followed by amiodarone 200 mg once daily. -Patient will benefit from systemic anticoagulation with Eliquis 5 mg twice daily. Patient did not have any falls in the last 3 years. Last fall was in 2020 resulting in fracture. Okay to continue Eliquis 5 mg twice daily.  She is instructed to hold Eliquis and let her cardiologist know if she has any falls in the future.  She voiced understanding.  # Valvular heart disease (moderate MR per 12/23 echo) -Medical management with p.o. Lasix 20 mg once daily -Outpatient echo surveillance every 1 year  # CAD status post CABG in 2016 -Not on aspirin due to Eliquis use -Continue rosuvastatin 5 mg nightly.  Goal LDL less than 70.  [facility] will sign off.   Medication Recommendations: P.o. multiple tartrate 50 mg twice daily, amiodarone 200 mg twice daily for 3 weeks followed by amiodarone 200 mg once daily. Other  recommendations: Nurse visit for EKG in 10 days Follow up as an outpatient: Cardiology APP follow-up in 1 month.  Otherwise she has scheduled follow-up with Dr. Harl Bowie in 05/2022.   I have spent a total of 33 minutes with patient reviewing chart , telemetry, EKGs, labs and examining patient as well as establishing an assessment and plan that was discussed with the patient.  > 50% of time was spent in direct patient care.     Signed, Angus Amini Olene Craven, MD  03/04/2022, 10:09 AM

## 2022-03-04 NOTE — TOC Progression Note (Addendum)
Transition of Care [facility]) - Progression Note    Patient Details  Name: Misty Edwards MRN: [id number] Date of Birth: [birth year]  Transition of Care [facility]) CM/SW Contact  Boneta Lucks, RN Phone Number: 03/04/2022, 12:33 PM  Clinical Narrative:   CM spoke with [name], she lives alone with sitter from [place] - He and is wife check on her daily to assist and provide transportation. He tries to get her to live with them, she wants to stay home.  He is agreeable to Home health. MD ordered RN/PT. Caryl Pina has the referral,  updated with RN order.   Expected Discharge Plan: [place] Barriers to Discharge: Continued Medical Work up  Expected Discharge Plan and Services Expected Discharge Plan: [place] In-house Referral: Clinical Social Work   Post Acute Care Choice: [place] arrangements for the past 2 months: [place]: PT [place]: [place] ([place]) Date [place]: 03/03/22 Time [place]: [place] Representative spoke with at [place]: Caryl Pina   Readmission Risk Interventions     No data to display

## 2022-03-04 NOTE — Progress Notes (Signed)
Patient is resting in her bed at this time. Most recent  vital signs are T. 97.4 P 79 RR 18 B/P 140/93 O2 sats 100% on 3.5L/M. One prn medication given during shift. See MAR.

## 2022-03-05 ENCOUNTER — Telehealth: Payer: Self-pay | Admitting: Family Medicine

## 2022-03-05 DIAGNOSIS — I4891 Unspecified atrial fibrillation: Secondary | ICD-10-CM | POA: Diagnosis not present

## 2022-03-05 DIAGNOSIS — I5033 Acute on chronic diastolic (congestive) heart failure: Secondary | ICD-10-CM | POA: Diagnosis not present

## 2022-03-05 DIAGNOSIS — I5031 Acute diastolic (congestive) heart failure: Secondary | ICD-10-CM

## 2022-03-05 LAB — BASIC METABOLIC PANEL
Anion gap: 8 (ref 5–15)
BUN: 20 mg/dL (ref 8–23)
CO2: 29 mmol/L (ref 22–32)
Calcium: 9 mg/dL (ref 8.9–10.3)
Chloride: 99 mmol/L (ref 98–111)
Creatinine, Ser: 0.79 mg/dL (ref 0.44–1.00)
GFR, Estimated: >60 mL/min (ref >60)
Glucose, Bld: 119 mg/dL — ABNORMAL HIGH (ref 70–99)
Potassium: 3.7 mmol/L (ref 3.5–5.1)
Sodium: 136 mmol/L (ref 135–145)

## 2022-03-05 LAB — GLUCOSE, CAPILLARY
Glucose-Capillary: 174 mg/dL — ABNORMAL HIGH (ref 70–99)
Glucose-Capillary: 150 mg/dL — ABNORMAL HIGH (ref 70–99)

## 2022-03-05 MED ORDER — AMIODARONE HCL 200 MG PO TABS: ORAL_TABLET | ORAL | 3 refills | Status: DC

## 2022-03-05 MED ORDER — APIXABAN 5 MG PO TABS: 5.0000 mg | ORAL_TABLET | Freq: Two times a day (BID) | ORAL | 3 refills | Status: DC

## 2022-03-05 MED ORDER — AMIODARONE HCL 200 MG PO TABS
200.0000 mg | ORAL_TABLET | Freq: Two times a day (BID) | ORAL | Status: DC
  Administered 2022-03-05: 200 mg via ORAL
  Filled 2022-03-05: qty 1

## 2022-03-05 MED ORDER — METOPROLOL TARTRATE 50 MG PO TABS: 50.0000 mg | ORAL_TABLET | Freq: Two times a day (BID) | ORAL | 3 refills | Status: DC

## 2022-03-05 NOTE — TOC Transition Note (Signed)
Transition of Care [facility]) - CM/SW Discharge Note   Patient Details  Name: Misty Edwards MRN: [id number] Date of Birth: 1926/12/22  Transition of [facility]) CM/SW Contact:  Boneta Lucks, RN Phone Number: 03/05/2022, 1:14 PM   Clinical Narrative:   Patient discharging home. Grandson at the bedside. Patient needing home oxygen. Agreeable to [place]. Tank given to RN for discharge. [place] updated with DC time. Caryl Pina with [facility] updated with discharge [facility] RN/PT placed.     Final next level of care: [place] Barriers to Discharge: Barriers Resolved   Patient Goals and CMS Choice Patient states their goals for this hospitalization and ongoing recovery are:: return home   Choice offered to / list presented to : Patient   Discharge Placement         Name of family member notified: Grandson Patient and family notified of of transfer: 03/05/22  Discharge Plan and Services In-house Referral: Clinical Social Work   Post Acute Care Choice: Home Health          DME Arranged: Oxygen DME Agency: [employer] Date DME Agency Contacted: 03/05/22 Time DME Agency Contacted: [phone] Representative spoke with at DME Agency: [place]: PT [place]: [place] ([place]) Date [place]: 03/03/22 Time [place]: [place] Representative spoke with at [place] : Caryl Pina  Readmission Risk Interventions    03/05/2022    1:14 PM  Readmission Risk Prevention Plan  Transportation Screening Complete  PCP or Specialist Appt within 5-7 Days Complete  Home Care Screening Complete  Medication Review (RN CM) Complete

## 2022-03-05 NOTE — Care Management Important Message (Signed)
Important Message  Patient Details  Name: Misty Edwards MRN: [id number] Date of Birth: Aug 09, 1926   Medicare Important Message Given:  Yes     [name] 03/05/2022, 12:07 PM

## 2022-03-05 NOTE — Progress Notes (Signed)
Patient stable and ready for discharge home. Patient's POA at bedside to take patient home, writer went over discharge paperwork with him and patient and he verbalized understanding. Writer showed POA how to hook up home O@ and turn it on and went over how to check O2 sats at home with pulse ox. NT dressed patient and writer removed IV. Patient transported to car via [place]. Patient's home O2 was deliver prior to discharge and SW contacted [employer] to let them know patient was discharging home at this time.

## 2022-03-05 NOTE — Telephone Encounter (Signed)
Caryl Pina with [employer] left a voicemail message to notify us he's setting up patient's oxygen supply today upon discharge. [employer] will be her oxygen supplier. Requested a call if anything additional is needed. Please advise at [phone] (cell) or [phone] (office).

## 2022-03-05 NOTE — Progress Notes (Signed)
SATURATION QUALIFICATIONS: (This note is used to comply with regulatory documentation for home oxygen)  Patient Saturations on Room Air at Rest = 93%  Patient Saturations on Room Air while Ambulating = 84%  Patient Saturations on 2 Liters of oxygen while Ambulating = 94%  Please briefly explain why patient needs home oxygen:   Patient was on 31/2L of O2 when shift started dropped down to 2L sat 97%. When O2 removed O2 down to 91% and once walked from bed to doorway dropped 84%. Placed patient back on 2L O2 now 94%

## 2022-03-05 NOTE — Discharge Summary (Signed)
Physician Discharge Summary   Misty Edwards JHE:[id number] DOB: May 20, 1926 DOA: 03/01/2022  PCP: Susy Frizzle, MD  Admit date: 03/01/2022 Discharge date: 03/05/2022  Barriers to discharge: none  Admitted From: Home Disposition:  Home Discharging physician: Dwyane Dee, MD  Recommendations for Outpatient Follow-up:  Follow up with cardiology  Home Health:  Equipment/Devices:   Discharge Condition: stable CODE STATUS: Full Diet recommendation:  Diet Orders (From admission, onward)     Start     Ordered   03/05/22 0000  Diet - low sodium heart healthy        03/05/22 1236   03/03/22 1310  Diet Heart Room service appropriate? Yes; Fluid consistency: Thin  Diet effective now       Question Answer Comment  Room service appropriate? Yes   Fluid consistency: Thin      03/03/22 1309            Hospital Course:  New onset atrial fibrillation with RVR -Evaluated by cardiology as well.  Considered okay for initiating on Eliquis - Continued on Eliquis and metoprolol at discharge -Also recommended for amiodarone with loading, prescribed at discharge -Outpatient follow-up with cardiology   Chronic diastolic HF (congestive heart failure) (HCC) -Lasix continued at discharge - Outpatient follow-up with cardiology   Nausea and vomiting  - resolved  - transient event; no further subsequent events - resolved with supportive care    Hyperkalemia - resolved  - treated   Hypervolemic hyponatremia - resolved    Normocytic anemia: - eliquis at discharge    Type 2 diabetes mellitus with vascular disease and hyperglycemia: -A1c 6% on 03/02/2022 - Continue diet control   Essential hypertension -Continue amlodipine   S/P CABG x 3 -Aspirin discontinued and Eliquis started after cardiology evaluation   The patient's chronic medical conditions were treated accordingly per the patient's home medication regimen except as noted.  On day of discharge, patient was felt  deemed stable for discharge. Patient/family member advised to call PCP or come back to ER if needed.   Principal Diagnosis: Acute exacerbation of CHF (congestive heart failure) ([place])  Discharge Diagnoses: Active Hospital Problems   Diagnosis Date Noted   Acute exacerbation of CHF (congestive heart failure) ([place]) 03/01/2022   Type 2 diabetes mellitus with vascular disease ([place]) 03/07/2015    Priority: Medium    Coronary artery disease involving coronary bypass graft of native heart without angina pectoris 03/04/2022   Nonrheumatic mitral valve regurgitation 03/03/2022   New onset atrial fibrillation ([place]) 18/56/3149   Acute diastolic heart failure ([place]) 10/23/2016   S/P CABG x 3 08/25/2014   Essential hypertension 01/26/2008   GERD 01/26/2008    Resolved Hospital Problems  No resolved problems to display.     Discharge Instructions     Diet - low sodium heart healthy   Complete by: As directed    Increase activity slowly   Complete by: As directed       Allergies as of 03/05/2022       Reactions   Calcium-containing Compounds Nausea Only   Morphine And Related Other (See Comments)   Hallucination   Raloxifene Itching   Evista- Face and eyes burning   Vitamin D Analogs Nausea Only        Medication List     STOP taking these medications    aspirin EC 81 MG tablet       TAKE these medications    amiodarone 200 MG tablet Commonly known as: PACERONE Take 1  tablet twice a day for 3 weeks, then 1 tablet daily   amLODipine 5 MG tablet Commonly known as: NORVASC TAKE 1 TABLET BY MOUTH ONCE DAILY . APPOINTMENT REQUIRED FOR FUTURE REFILLS What changed: See the new instructions.   apixaban 5 MG Tabs tablet Commonly known as: ELIQUIS Take 1 tablet (5 mg total) by mouth 2 (two) times daily.   BD Insulin Syringe U/F 31G X 5/16" 0.5 ML Misc Generic drug: Insulin Syringe-Needle U-100 USE AS DIRECTED TWICE DAILY   Blood Glucose System Pak Kit Please dispense as  One Touch Ultra. Use as directed to monitor FSBS 4x daily. Dx: E11.65   calcium carbonate 500 MG chewable tablet Commonly known as: TUMS - dosed in mg elemental calcium Chew 3 tablets by mouth at bedtime.   Creon 24000-76000 units Cpep Generic drug: Pancrelipase (Lip-Prot-Amyl) TAKE 2 CAPSULES BY MOUTH WITH MEALS AND 1 CAP WITH SNACKS x 2   EQ Allergy Relief (Cetirizine) 10 MG tablet Generic drug: cetirizine Take 1 tablet by mouth once daily   ferrous sulfate 325 (65 FE) MG tablet Take 325 mg by mouth daily with breakfast.   FLUoxetine 20 MG capsule Commonly known as: PROZAC Take 1 capsule (20 mg total) by mouth daily.   furosemide 20 MG tablet Commonly known as: LASIX Take 1 tablet by mouth once daily   gabapentin 100 MG capsule Commonly known as: NEURONTIN TAKE 1 CAPSULE BY MOUTH THREE TIMES DAILY AS NEEDED FOR NERVE PAIN What changed: See the new instructions.   Lancets Misc. Misc Pt has Accu Chek Aviva Plus - Needs just lancets  Checks BS 4-5 times per day. Disp 3 boxes(90 day Supply)/4 refills Dx code. E11.9   LORazepam 1 MG tablet Commonly known as: ATIVAN TAKE 1/2 TO 1 (ONE-HALF TO ONE) TABLET BY MOUTH TWICE DAILY AS NEEDED What changed: See the new instructions.   MAGnesium-Oxide 400 (240 Mg) MG tablet Generic drug: magnesium oxide TAKE 1 TABLET BY MOUTH ONCE DAILY . APPOINTMENT REQUIRED FOR FUTURE REFILLS What changed: See the new instructions.   metoprolol tartrate 50 MG tablet Commonly known as: LOPRESSOR Take 1 tablet (50 mg total) by mouth 2 (two) times daily. What changed:  medication strength how much to take   multivitamin capsule Take 1 capsule by mouth every morning.   NovoLIN 70/30 ReliOn (70-30) 100 UNIT/ML injection Generic drug: insulin NPH-regular Human INJECT 25 UNITS SUBCUTANEOUSLY IN THE MORNING AND 15 IN THE EVENING What changed:  how to take this when to take this additional instructions   OneTouch Delica Lancets 59Y Misc CHECK  BLOOD SUGAR 4-5 TIMES PER DAY   OneTouch Ultra test strip Generic drug: glucose blood USE 1 STRIP TO CHECK GLUCOSE FIVE TIMES DAILY   pantoprazole 40 MG tablet Commonly known as: PROTONIX Take 1 tablet by mouth once daily   potassium chloride 10 MEQ tablet Commonly known as: KLOR-CON TAKE 1 TABLET BY MOUTH ONCE DAILY AS NEEDED (TAKE WITH FUROSEMIDE) What changed: See the new instructions.   rosuvastatin 5 MG tablet Commonly known as: CRESTOR Take 1 tablet by mouth once daily   triamcinolone cream 0.1 % Commonly known as: KENALOG Apply topically 2 (two) times daily.               Durable Medical Equipment  (From admission, onward)           Start     Ordered   03/05/22 1020  For home use only DME oxygen  Once  Question Answer Comment  Length of Need 6 Months   Mode or (Route) Nasal cannula   Liters per Minute 2   Frequency Continuous (stationary and portable oxygen unit needed)   Oxygen conserving device Yes   Oxygen delivery system Gas      03/05/22 1019            Follow-up Information     [facility] Follow up.   Why: Will contact you to schedule home health visits.        Mallipeddi, Vishnu P, MD. Schedule an appointment as soon as possible for a visit in 1 week(s).   Specialties: Cardiology, Internal Medicine Why: Make appointment for nurse visit in 1 week for an EKG, then a PA visit in 1 month Contact information: [place]. [address] [place] [address] [phone]         [facility] Follow up.   Why: Home Oxygen Contact information: [place] [phone]                Allergies  Allergen Reactions   Calcium-Containing Compounds Nausea Only   Morphine And Related Other (See Comments)    Hallucination   Raloxifene Itching    Evista- Face and eyes burning   Vitamin D Analogs Nausea Only    Consultations: Cardiology  Procedures:   Discharge Exam: BP (!) 178/73   Pulse 93    Temp 98.3 F (36.8 C)   Resp 19   Ht _0  (1.6 m)   Wt 70.6 kg   SpO2 94%   BMI 27.57 kg/m  Physical Exam Constitutional:      Appearance: Normal appearance.  HENT:     Head: Normocephalic and atraumatic.     Mouth/Throat:     Mouth: Mucous membranes are moist.  Eyes:     Extraocular Movements: Extraocular movements intact.  Cardiovascular:     Rate and Rhythm: Normal rate. Rhythm irregular.  Pulmonary:     Effort: Pulmonary effort is normal. No respiratory distress.     Breath sounds: Normal breath sounds. No wheezing.  Abdominal:     General: Bowel sounds are normal. There is no distension.     Palpations: Abdomen is soft.     Tenderness: There is no abdominal tenderness.  Musculoskeletal:        General: No swelling. Normal range of motion.     Cervical back: Normal range of motion and neck supple.  Skin:    General: Skin is warm and dry.  Neurological:     General: No focal deficit present.     Mental Status: She is alert.  Psychiatric:        Mood and Affect: Mood normal.      The results of significant diagnostics from this hospitalization (including imaging, microbiology, ancillary and laboratory) are listed below for reference.   Microbiology: Recent Results (from the past 240 hour(s))  Resp panel by RT-PCR (RSV, Flu A&B, Covid) Anterior Nasal Swab     Status: None   Collection Time: 03/01/22  3:10 PM   Specimen: Anterior Nasal Swab  Result Value Ref Range Status   SARS Coronavirus 2 by RT PCR NEGATIVE NEGATIVE Final    Comment: (NOTE) SARS-CoV-2 target nucleic acids are NOT DETECTED.  The SARS-CoV-2 RNA is generally detectable in upper respiratory specimens during the acute phase of infection. The lowest concentration of SARS-CoV-2 viral copies this assay can detect is 138 copies/mL. A negative result does not preclude  SARS-Cov-2 infection and should not be used as the sole basis for treatment or other patient management decisions. A negative result  may occur with  improper specimen collection/handling, submission of specimen other than nasopharyngeal swab, presence of viral mutation(s) within the areas targeted by this assay, and inadequate number of viral copies(<138 copies/mL). A negative result must be combined with clinical observations, patient history, and epidemiological information. The expected result is Negative.  Fact Sheet for Patients:  EntrepreneurPulse.com.au  Fact Sheet for Healthcare Providers:  IncredibleEmployment.be  This test is no t yet approved or cleared by the [place] FDA and  has been authorized for detection and/or diagnosis of SARS-CoV-2 by FDA under an Emergency Use Authorization (EUA). This EUA will remain  in effect (meaning this test can be used) for the duration of the COVID-19 declaration under Section 564(b)(1) of the Act, 21 U.S.C.section 360bbb-3(b)(1), unless the authorization is terminated  or revoked sooner.       Influenza A by PCR NEGATIVE NEGATIVE Final   Influenza B by PCR NEGATIVE NEGATIVE Final    Comment: (NOTE) The Xpert Xpress SARS-CoV-2/FLU/RSV plus assay is intended as an aid in the diagnosis of influenza from Nasopharyngeal swab specimens and should not be used as a sole basis for treatment. Nasal washings and aspirates are unacceptable for Xpert Xpress SARS-CoV-2/FLU/RSV testing.  Fact Sheet for Patients: EntrepreneurPulse.com.au  Fact Sheet for Healthcare Providers: IncredibleEmployment.be  This test is not yet approved or cleared by the [place] FDA and has been authorized for detection and/or diagnosis of SARS-CoV-2 by FDA under an Emergency Use Authorization (EUA). This EUA will remain in effect (meaning this test can be used) for the duration of the COVID-19 declaration under Section 564(b)(1) of the Act, 21 U.S.C. section 360bbb-3(b)(1), unless the authorization is terminated  or revoked.     Resp Syncytial Virus by PCR NEGATIVE NEGATIVE Final    Comment: (NOTE) Fact Sheet for Patients: EntrepreneurPulse.com.au  Fact Sheet for Healthcare Providers: IncredibleEmployment.be  This test is not yet approved or cleared by the [place] FDA and has been authorized for detection and/or diagnosis of SARS-CoV-2 by FDA under an Emergency Use Authorization (EUA). This EUA will remain in effect (meaning this test can be used) for the duration of the COVID-19 declaration under Section 564(b)(1) of the Act, 21 U.S.C. section 360bbb-3(b)(1), unless the authorization is terminated or revoked.  Performed at [facility], [address], [place], [place] [address]   Culture, blood (routine x 2)     Status: None (Preliminary result)   Collection Time: 03/01/22  4:25 PM   Specimen: BLOOD  Result Value Ref Range Status   Specimen Description BLOOD BLOOD RIGHT HAND  Final   Special Requests BLOOD  Final   Culture   Final    NO GROWTH 4 DAYS Performed at [facility], [address] [place], [place] [address]    Report Status PENDING  Incomplete  Culture, blood (routine x 2)     Status: None (Preliminary result)   Collection Time: 03/01/22  4:25 PM   Specimen: BLOOD  Result Value Ref Range Status   Specimen Description BLOOD BLOOD LEFT HAND  Final   Special Requests BLOOD  Final   Culture   Final    NO GROWTH 4 DAYS Performed at [facility], [address], [place], [place] [address]    Report Status PENDING  Incomplete     Labs: BNP (last 3 results) Recent Labs    03/01/22 1625  BNP 025.8*   Basic Metabolic Panel: Recent Labs  Lab 03/01/22 1625 03/01/22 1828 03/02/22 0647 03/03/22 0611 03/04/22 0453 03/04/22 1222 03/05/22 0436  NA 125*  --  130* 129* 132*  --  136  K 3.9  --  3.7 4.5 5.7*  --  3.7  CL 88*  --  88* 92* 97*  --  99  CO2 25  --  _0 --  29  GLUCOSE 182*  --  190* 174* 211* 311* 119*  BUN  16  --  _1 --  20  CREATININE 0.86  --  0.88 0.93 0.94  --  0.79  CALCIUM 9.1  --  8.7* 8.6* 9.1  --  9.0  MG  --  2.0  --   --   --   --   --    Liver Function Tests: No results for input(s): "AST", "ALT", "ALKPHOS", "BILITOT", "PROT", "ALBUMIN" in the last 168 hours. No results for input(s): "LIPASE", "AMYLASE" in the last 168 hours. No results for input(s): "AMMONIA" in the last 168 hours. CBC: Recent Labs  Lab 03/01/22 1625 03/02/22 0647  WBC 9.2 6.8  NEUTROABS 8.0*  --   HGB 12.6 11.8*  HCT 37.7 35.0*  MCV 85.1 85.2  PLT 203 174   Cardiac Enzymes: No results for input(s): "CKTOTAL", "CKMB", "CKMBINDEX", "TROPONINI" in the last 168 hours. BNP: Invalid input(s): "POCBNP" CBG: Recent Labs  Lab 03/04/22 1354 03/04/22 1814 03/04/22 2204 03/05/22 0730 03/05/22 1121  GLUCAP 282* 149* 117* 174* 150*   D-Dimer No results for input(s): "DDIMER" in the last 72 hours. Hgb A1c No results for input(s): "HGBA1C" in the last 72 hours. Lipid Profile No results for input(s): "CHOL", "HDL", "LDLCALC", "TRIG", "CHOLHDL", "LDLDIRECT" in the last 72 hours. Thyroid function studies No results for input(s): "TSH", "T4TOTAL", "T3FREE", "THYROIDAB" in the last 72 hours.  Invalid input(s): "FREET3" Anemia work up No results for input(s): "VITAMINB12", "FOLATE", "FERRITIN", "TIBC", "IRON", "RETICCTPCT" in the last 72 hours. Urinalysis    Component Value Date/Time   COLORURINE STRAW (A) 03/01/2022 1530   APPEARANCEUR CLEAR 03/01/2022 1530   LABSPEC 1.006 03/01/2022 1530   PHURINE 6.0 03/01/2022 1530   GLUCOSEU 50 (A) 03/01/2022 1530   HGBUR SMALL (A) 03/01/2022 1530   BILIRUBINUR NEGATIVE 03/01/2022 1530   KETONESUR NEGATIVE 03/01/2022 1530   PROTEINUR 30 (A) 03/01/2022 1530   UROBILINOGEN 0.2 09/02/2014 1445   NITRITE NEGATIVE 03/01/2022 1530   LEUKOCYTESUR NEGATIVE 03/01/2022 1530   Sepsis Labs Recent Labs  Lab 03/01/22 1625 03/02/22 0647  WBC 9.2 6.8    Microbiology Recent Results (from the past 240 hour(s))  Resp panel by RT-PCR (RSV, Flu A&B, Covid) Anterior Nasal Swab     Status: None   Collection Time: 03/01/22  3:10 PM   Specimen: Anterior Nasal Swab  Result Value Ref Range Status   SARS Coronavirus 2 by RT PCR NEGATIVE NEGATIVE Final    Comment: (NOTE) SARS-CoV-2 target nucleic acids are NOT DETECTED.  The SARS-CoV-2 RNA is generally detectable in upper respiratory specimens during the acute phase of infection. The lowest concentration of SARS-CoV-2 viral copies this assay can detect is 138 copies/mL. A negative result does not preclude SARS-Cov-2 infection and should not be used as the sole basis for treatment or other patient management decisions. A negative result may occur with  improper specimen collection/handling, submission of specimen other than nasopharyngeal swab, presence of viral mutation(s) within the areas targeted by  this assay, and inadequate number of viral copies(<138 copies/mL). A negative result must be combined with clinical observations, patient history, and epidemiological information. The expected result is Negative.  Fact Sheet for Patients:  EntrepreneurPulse.com.au  Fact Sheet for Healthcare Providers:  IncredibleEmployment.be  This test is no t yet approved or cleared by the [place] FDA and  has been authorized for detection and/or diagnosis of SARS-CoV-2 by FDA under an Emergency Use Authorization (EUA). This EUA will remain  in effect (meaning this test can be used) for the duration of the COVID-19 declaration under Section 564(b)(1) of the Act, 21 U.S.C.section 360bbb-3(b)(1), unless the authorization is terminated  or revoked sooner.       Influenza A by PCR NEGATIVE NEGATIVE Final   Influenza B by PCR NEGATIVE NEGATIVE Final    Comment: (NOTE) The Xpert Xpress SARS-CoV-2/FLU/RSV plus assay is intended as an aid in the diagnosis of  influenza from Nasopharyngeal swab specimens and should not be used as a sole basis for treatment. Nasal washings and aspirates are unacceptable for Xpert Xpress SARS-CoV-2/FLU/RSV testing.  Fact Sheet for Patients: EntrepreneurPulse.com.au  Fact Sheet for Healthcare Providers: IncredibleEmployment.be  This test is not yet approved or cleared by the [place] FDA and has been authorized for detection and/or diagnosis of SARS-CoV-2 by FDA under an Emergency Use Authorization (EUA). This EUA will remain in effect (meaning this test can be used) for the duration of the COVID-19 declaration under Section 564(b)(1) of the Act, 21 U.S.C. section 360bbb-3(b)(1), unless the authorization is terminated or revoked.     Resp Syncytial Virus by PCR NEGATIVE NEGATIVE Final    Comment: (NOTE) Fact Sheet for Patients: EntrepreneurPulse.com.au  Fact Sheet for Healthcare Providers: IncredibleEmployment.be  This test is not yet approved or cleared by the [place] FDA and has been authorized for detection and/or diagnosis of SARS-CoV-2 by FDA under an Emergency Use Authorization (EUA). This EUA will remain in effect (meaning this test can be used) for the duration of the COVID-19 declaration under Section 564(b)(1) of the Act, 21 U.S.C. section 360bbb-3(b)(1), unless the authorization is terminated or revoked.  Performed at [facility], [address], [place], [place] [address]   Culture, blood (routine x 2)     Status: None (Preliminary result)   Collection Time: 03/01/22  4:25 PM   Specimen: BLOOD  Result Value Ref Range Status   Specimen Description BLOOD BLOOD RIGHT HAND  Final   Special Requests BLOOD  Final   Culture   Final    NO GROWTH 4 DAYS Performed at [facility], [address] [place], [place] [address]    Report Status PENDING  Incomplete  Culture, blood (routine x 2)     Status: None  (Preliminary result)   Collection Time: 03/01/22  4:25 PM   Specimen: BLOOD  Result Value Ref Range Status   Specimen Description BLOOD BLOOD LEFT HAND  Final   Special Requests BLOOD  Final   Culture   Final    NO GROWTH 4 DAYS Performed at [facility], [address] [place], [place] [address]    Report Status PENDING  Incomplete    Procedures/Studies: ECHOCARDIOGRAM COMPLETE  Result Date: 03/02/2022    ECHOCARDIOGRAM REPORT   Patient Name:   Misty Edwards Date of Exam: 03/02/2022 Medical Rec #:  [id number]       Height:       63.0 in Accession #:    [id number]      Weight:  154.8 lb Date of Birth:  Mar 29, 1926       BSA:          1.734 m Patient Age:    65 years        BP:           148/71 mmHg Patient Gender: F               HR:           76 bpm. Exam Location:  Inpatient Procedure: 2D Echo, Cardiac Doppler and Color Doppler Indications:    I50.40* Unspecified combined systolic (congestive) and diastolic                 (congestive) heart failure; I48.91* Unspeicified atrial                 fibrillation  History:        Patient has prior history of Echocardiogram examinations, most                 recent 09/25/2019. CHF, CAD and Previous Myocardial Infarction,                 Abnormal ECG and Prior CABG, Arrythmias:Atrial Fibrillation;                 Risk Factors:Diabetes and Dyslipidemia. Cancer.  Sonographer:    Roseanna Rainbow RDCS Referring Phys: [phone] JACOB J STINSON  Sonographer Comments: Technically difficult study due to poor echo windows. IMPRESSIONS  1. Left ventricular ejection fraction, by estimation, is 60 to 65%. The left ventricle has normal function. The left ventricle demonstrates regional wall motion abnormalities (see scoring diagram/findings for description). There is moderate asymmetric left ventricular hypertrophy of the basal-septal segment. Left ventricular diastolic parameters are indeterminate.  2. Right ventricular systolic function is mildly reduced. The right  ventricular size is normal. There is normal pulmonary artery systolic pressure. The estimated right ventricular systolic pressure is 42.3 mmHg.  3. Left atrial size was moderately dilated.  4. At least moderate mitral regurgitation with horizontal color splay and with Doppler evidnece of right sided pulmonary vein flow reversal. The mitral valve is normal in structure. Moderate mitral valve regurgitation.  5. The aortic valve is tricuspid. Aortic valve regurgitation is trivial. No aortic stenosis is present. Comparison(s): Mitral regurgitation has increased. FINDINGS  Left Ventricle: Left ventricular ejection fraction, by estimation, is 60 to 65%. The left ventricle has normal function. The left ventricle demonstrates regional wall motion abnormalities. The left ventricular internal cavity size was normal in size. There is moderate asymmetric left ventricular hypertrophy of the basal-septal segment. Left ventricular diastolic parameters are indeterminate.  LV Wall Scoring: The apical septal segment is hypokinetic. Right Ventricle: The right ventricular size is normal. No increase in right ventricular wall thickness. Right ventricular systolic function is mildly reduced. There is normal pulmonary artery systolic pressure. The tricuspid regurgitant velocity is 2.44 m/s, and with an assumed right atrial pressure of 8 mmHg, the estimated right ventricular systolic pressure is 95.3 mmHg. Left Atrium: Left atrial size was moderately dilated. Right Atrium: Right atrial size was normal in size. Pericardium: There is no evidence of pericardial effusion. Mitral Valve: At least moderate mitral regurgitation with horizontal color splay and with Doppler evidnece of right sided pulmonary vein flow reversal. The mitral valve is normal in structure. Moderate mitral valve regurgitation. MV peak gradient, 10.1 mmHg. The mean mitral valve gradient is 3.0 mmHg. Tricuspid Valve: The tricuspid valve is normal in structure. Tricuspid  valve regurgitation is trivial. No evidence of tricuspid stenosis. Aortic Valve: The aortic valve is tricuspid. Aortic valve regurgitation is trivial. No aortic stenosis is present. Pulmonic Valve: The pulmonic valve was not well visualized. Pulmonic valve regurgitation is mild. No evidence of pulmonic stenosis. Aorta: The aortic root and ascending aorta are structurally normal, with no evidence of dilitation. IAS/Shunts: No atrial level shunt detected by color flow Doppler.  LEFT VENTRICLE PLAX 2D LVIDd:         4.10 cm LVIDs:         2.60 cm LV PW:         0.90 cm LV IVS:        1.10 cm LVOT diam:     2.30 cm LV SV:         83 LV SV Index:   48 LVOT Area:     4.15 cm  RIGHT VENTRICLE            IVC RV S prime:     5.87 cm/s  IVC diam: 2.30 cm TAPSE (M-mode): 0.7 cm LEFT ATRIUM             Index        RIGHT ATRIUM           Index LA diam:        4.40 cm 2.54 cm/m   RA Area:     15.70 cm LA Vol (A2C):   56.0 ml 32.30 ml/m  RA Volume:   38.30 ml  22.09 ml/m LA Vol (A4C):   56.6 ml 32.61 ml/m LA Biplane Vol: 68.1 ml 39.27 ml/m  AORTIC VALVE             PULMONIC VALVE LVOT Vmax:   114.00 cm/s PR End Diast Vel: 3.00 msec LVOT Vmean:  75.700 cm/s LVOT VTI:    0.199 m  AORTA Ao Root diam: 2.60 cm Ao Asc diam:  2.50 cm MITRAL VALVE                  TRICUSPID VALVE MV Area (PHT): 3.99 cm       TR Peak grad:   23.8 mmHg MV Area VTI:   2.41 cm       TR Vmax:        244.00 cm/s MV Peak grad:  10.1 mmHg MV Mean grad:  3.0 mmHg       SHUNTS MV Vmax:       1.59 m/s       Systemic VTI:  0.20 m MV Vmean:      82.1 cm/s      Systemic Diam: 2.30 cm MV Decel Time: 190 msec MR Peak grad:    83.5 mmHg MR Mean grad:    58.0 mmHg MR Vmax:         457.00 cm/s MR Vmean:        355.0 cm/s MR PISA:         1.01 cm MR PISA Eff ROA: 9 mm MR PISA Radius:  0.40 cm MV E velocity: 142.00 cm/s Rudean Haskell MD Electronically signed by Rudean Haskell MD Signature Date/Time: 03/02/2022/6:51:46 PM    Final    DG Chest Port 1  View  Result Date: 03/01/2022 CLINICAL DATA:  Shortness of breath EXAM: PORTABLE CHEST 1 VIEW COMPARISON:  09/25/2019 FINDINGS: Enlargement of the cardiac silhouette, increased from prior. Prior sternotomy and CABG. Diffusely prominent interstitial markings bilaterally with streaky bibasilar opacities. Possible trace pleural effusions. No pneumothorax. IMPRESSION:  1. Findings suggestive of CHF with pulmonary edema. 2. Enlargement of the cardiac silhouette, increased from prior. Underlying pericardial effusion not excluded. Electronically Signed   By: Davina Poke D.O.   On: 03/01/2022 15:40     Time coordinating discharge: Over 30 minutes    Dwyane Dee, MD  [facility] 03/05/2022, 4:16 PM

## 2022-03-05 NOTE — Discharge Instructions (Signed)

## 2022-03-06 LAB — CULTURE, BLOOD (ROUTINE X 2)
Culture: NO GROWTH
Culture: NO GROWTH

## 2022-03-10 ENCOUNTER — Other Ambulatory Visit: Payer: Self-pay | Admitting: Family Medicine

## 2022-03-11 ENCOUNTER — Encounter (HOSPITAL_COMMUNITY): Payer: Self-pay

## 2022-03-11 ENCOUNTER — Other Ambulatory Visit: Payer: Self-pay

## 2022-03-11 ENCOUNTER — Emergency Department (HOSPITAL_COMMUNITY): Payer: PPO

## 2022-03-11 ENCOUNTER — Telehealth: Payer: Self-pay | Admitting: Cardiology

## 2022-03-11 ENCOUNTER — Inpatient Hospital Stay (HOSPITAL_COMMUNITY)
Admission: EM | Admit: 2022-03-11 | Discharge: 2022-03-15 | DRG: 291 | Disposition: A | Discharge status: Home Health | Payer: PPO | Attending: Internal Medicine | Admitting: Internal Medicine

## 2022-03-11 DIAGNOSIS — Z8041 Family history of malignant neoplasm of ovary: Secondary | ICD-10-CM | POA: Diagnosis not present

## 2022-03-11 DIAGNOSIS — I4891 Unspecified atrial fibrillation: Secondary | ICD-10-CM | POA: Diagnosis present

## 2022-03-11 DIAGNOSIS — E1151 Type 2 diabetes mellitus with diabetic peripheral angiopathy without gangrene: Secondary | ICD-10-CM | POA: Diagnosis present

## 2022-03-11 DIAGNOSIS — H548 Legal blindness, as defined in USA: Secondary | ICD-10-CM | POA: Diagnosis present

## 2022-03-11 DIAGNOSIS — Z9981 Dependence on supplemental oxygen: Secondary | ICD-10-CM | POA: Diagnosis not present

## 2022-03-11 DIAGNOSIS — I4819 Other persistent atrial fibrillation: Secondary | ICD-10-CM | POA: Diagnosis present

## 2022-03-11 DIAGNOSIS — I130 Hypertensive heart and chronic kidney disease with heart failure and stage 1 through stage 4 chronic kidney disease, or unspecified chronic kidney disease: Secondary | ICD-10-CM | POA: Diagnosis present

## 2022-03-11 DIAGNOSIS — Z885 Allergy status to narcotic agent status: Secondary | ICD-10-CM

## 2022-03-11 DIAGNOSIS — Z888 Allergy status to other drugs, medicaments and biological substances status: Secondary | ICD-10-CM | POA: Diagnosis not present

## 2022-03-11 DIAGNOSIS — K861 Other chronic pancreatitis: Secondary | ICD-10-CM | POA: Diagnosis present

## 2022-03-11 DIAGNOSIS — E1159 Type 2 diabetes mellitus with other circulatory complications: Secondary | ICD-10-CM | POA: Diagnosis present

## 2022-03-11 DIAGNOSIS — Z800 Family history of malignant neoplasm of digestive organs: Secondary | ICD-10-CM

## 2022-03-11 DIAGNOSIS — E1165 Type 2 diabetes mellitus with hyperglycemia: Secondary | ICD-10-CM | POA: Diagnosis present

## 2022-03-11 DIAGNOSIS — I482 Chronic atrial fibrillation, unspecified: Secondary | ICD-10-CM | POA: Diagnosis not present

## 2022-03-11 DIAGNOSIS — I2510 Atherosclerotic heart disease of native coronary artery without angina pectoris: Secondary | ICD-10-CM | POA: Diagnosis present

## 2022-03-11 DIAGNOSIS — Z853 Personal history of malignant neoplasm of breast: Secondary | ICD-10-CM

## 2022-03-11 DIAGNOSIS — Z7901 Long term (current) use of anticoagulants: Secondary | ICD-10-CM

## 2022-03-11 DIAGNOSIS — E1122 Type 2 diabetes mellitus with diabetic chronic kidney disease: Secondary | ICD-10-CM | POA: Diagnosis present

## 2022-03-11 DIAGNOSIS — Z951 Presence of aortocoronary bypass graft: Secondary | ICD-10-CM

## 2022-03-11 DIAGNOSIS — Z79899 Other long term (current) drug therapy: Secondary | ICD-10-CM

## 2022-03-11 DIAGNOSIS — N183 Chronic kidney disease, stage 3 unspecified: Secondary | ICD-10-CM | POA: Diagnosis present

## 2022-03-11 DIAGNOSIS — Z794 Long term (current) use of insulin: Secondary | ICD-10-CM | POA: Diagnosis not present

## 2022-03-11 DIAGNOSIS — Z9011 Acquired absence of right breast and nipple: Secondary | ICD-10-CM | POA: Diagnosis not present

## 2022-03-11 DIAGNOSIS — E876 Hypokalemia: Secondary | ICD-10-CM | POA: Diagnosis present

## 2022-03-11 DIAGNOSIS — E785 Hyperlipidemia, unspecified: Secondary | ICD-10-CM | POA: Diagnosis present

## 2022-03-11 DIAGNOSIS — I5033 Acute on chronic diastolic (congestive) heart failure: Secondary | ICD-10-CM | POA: Diagnosis present

## 2022-03-11 DIAGNOSIS — Z8249 Family history of ischemic heart disease and other diseases of the circulatory system: Secondary | ICD-10-CM | POA: Diagnosis not present

## 2022-03-11 DIAGNOSIS — Z806 Family history of leukemia: Secondary | ICD-10-CM

## 2022-03-11 DIAGNOSIS — I509 Heart failure, unspecified: Secondary | ICD-10-CM | POA: Diagnosis present

## 2022-03-11 DIAGNOSIS — E871 Hypo-osmolality and hyponatremia: Secondary | ICD-10-CM | POA: Diagnosis present

## 2022-03-11 DIAGNOSIS — I340 Nonrheumatic mitral (valve) insufficiency: Secondary | ICD-10-CM | POA: Diagnosis not present

## 2022-03-11 DIAGNOSIS — I10 Essential (primary) hypertension: Secondary | ICD-10-CM | POA: Diagnosis present

## 2022-03-11 DIAGNOSIS — I5032 Chronic diastolic (congestive) heart failure: Secondary | ICD-10-CM | POA: Diagnosis not present

## 2022-03-11 LAB — CBC WITH DIFFERENTIAL/PLATELET
Abs Immature Granulocytes: 0.01 10*3/uL (ref 0.00–0.07)
Basophils Absolute: 0 10*3/uL (ref 0.0–0.1)
Basophils Relative: 0 %
Eosinophils Absolute: 0 10*3/uL (ref 0.0–0.5)
Eosinophils Relative: 0 %
HCT: 34.8 % — ABNORMAL LOW (ref 36.0–46.0)
Hemoglobin: 11.5 g/dL — ABNORMAL LOW (ref 12.0–15.0)
Immature Granulocytes: 0 %
Lymphocytes Relative: 15 %
Lymphs Abs: 1.1 10*3/uL (ref 0.7–4.0)
MCH: 27.6 pg (ref 26.0–34.0)
MCHC: 33 g/dL (ref 30.0–36.0)
MCV: 83.5 fL (ref 80.0–100.0)
Monocytes Absolute: 0.6 10*3/uL (ref 0.1–1.0)
Monocytes Relative: 8 %
Neutro Abs: 5.8 10*3/uL (ref 1.7–7.7)
Neutrophils Relative %: 77 %
Platelets: 214 10*3/uL (ref 150–400)
RBC: 4.17 MIL/uL (ref 3.87–5.11)
RDW: 13.8 % (ref 11.5–15.5)
WBC: 7.6 10*3/uL (ref 4.0–10.5)
nRBC: 0 % (ref 0.0–0.2)

## 2022-03-11 LAB — URINALYSIS, ROUTINE W REFLEX MICROSCOPIC
Bilirubin Urine: NEGATIVE
Glucose, UA: NEGATIVE mg/dL
Hgb urine dipstick: NEGATIVE
Ketones, ur: NEGATIVE mg/dL
Leukocytes,Ua: NEGATIVE
Nitrite: NEGATIVE
Protein, ur: NEGATIVE mg/dL
Specific Gravity, Urine: 1.005 (ref 1.005–1.030)
pH: 6 (ref 5.0–8.0)

## 2022-03-11 LAB — COMPREHENSIVE METABOLIC PANEL
ALT: 41 U/L (ref 0–44)
AST: 38 U/L (ref 15–41)
Albumin: 3.9 g/dL (ref 3.5–5.0)
Alkaline Phosphatase: 47 U/L (ref 38–126)
Anion gap: 12 (ref 5–15)
BUN: 23 mg/dL (ref 8–23)
CO2: 27 mmol/L (ref 22–32)
Calcium: 8.8 mg/dL — ABNORMAL LOW (ref 8.9–10.3)
Chloride: 85 mmol/L — ABNORMAL LOW (ref 98–111)
Creatinine, Ser: 0.91 mg/dL (ref 0.44–1.00)
GFR, Estimated: 59 mL/min — ABNORMAL LOW (ref >60)
Glucose, Bld: 127 mg/dL — ABNORMAL HIGH (ref 70–99)
Potassium: 3.8 mmol/L (ref 3.5–5.1)
Sodium: 124 mmol/L — ABNORMAL LOW (ref 135–145)
Total Bilirubin: 1.1 mg/dL (ref 0.3–1.2)
Total Protein: 7.1 g/dL (ref 6.5–8.1)

## 2022-03-11 LAB — TROPONIN I (HIGH SENSITIVITY)
Troponin I (High Sensitivity): 15 ng/L (ref <18)
Troponin I (High Sensitivity): 12 ng/L (ref <18)

## 2022-03-11 LAB — SODIUM, URINE, RANDOM: Sodium, Ur: 92 mmol/L

## 2022-03-11 LAB — MAGNESIUM: Magnesium: 1.7 mg/dL (ref 1.7–2.4)

## 2022-03-11 LAB — BRAIN NATRIURETIC PEPTIDE: B Natriuretic Peptide: 759 pg/mL — ABNORMAL HIGH (ref 0.0–100.0)

## 2022-03-11 MED ORDER — FUROSEMIDE 10 MG/ML IJ SOLN
40.0000 mg | Freq: Once | INTRAMUSCULAR | Status: AC
  Administered 2022-03-11: 40 mg via INTRAVENOUS
  Filled 2022-03-11: qty 4

## 2022-03-11 NOTE — Assessment & Plan Note (Signed)
No chest pain.  Troponin 15 > 13.  EKG without changes. -Resume statins, Eliquis

## 2022-03-11 NOTE — Assessment & Plan Note (Signed)
Per patient 15 pound weight gain over the past week.  With abdominal bloating, trace bilateral lower extremity swelling, chest x-ray showing pulmonary edema.  BNP 759.  Recent echo 02/2022, EF of 60 to 65%.  Reports compliance with 20 mg Lasix.  CHF likely exacerbated by new atrial fibrillation -IV Lasix 40 twice daily -Input output, daily weights Daily BMP

## 2022-03-11 NOTE — ED Provider Notes (Signed)
[facility] EMERGENCY DEPARTMENT Provider Note   CSN: [id number] Arrival date & time: 03/11/22  1250     History  Chief Complaint  Patient presents with   Shortness of Breath    Misty Edwards is a 88 y.o. female with history significant for CHF, HTN, Afib, breast CA was brought to ED with concerns of increased fatigue, abdominal swelling, and increased need of home oxygen.  Patient was seen by home health nurse.  She is oxygen dependent, normally on 2L [address], but had to increase to 4L.  She states she is taking her medicines as prescribed but she hasn't been urinating much.  She reports on Christmas she slept significantly more than she typically does.  Denies fever, chest pain, chest tightness, abdominal pain, weakness, syncope, vomiting, diarrhea, dysuria.        Home Medications Prior to Admission medications   Medication Sig Start Date End Date Taking? Authorizing Provider  amiodarone (PACERONE) 200 MG tablet Take 1 tablet twice a day for 3 weeks, then 1 tablet daily 03/05/22  Yes Lewie Chamber, MD  amLODipine (NORVASC) 5 MG tablet TAKE 1 TABLET BY MOUTH ONCE DAILY . APPOINTMENT REQUIRED FOR FUTURE REFILLS Patient taking differently: Take 5 mg by mouth daily. 02/27/22  Yes Branch, Dorothe Pea, MD  apixaban (ELIQUIS) 5 MG TABS tablet Take 1 tablet (5 mg total) by mouth 2 (two) times daily. 03/05/22  Yes Lewie Chamber, MD  calcium carbonate (TUMS - DOSED IN MG ELEMENTAL CALCIUM) 500 MG chewable tablet Chew 3 tablets by mouth at bedtime.   Yes [provider]  CREON 24000-76000 units CPEP TAKE 2 CAPSULES BY MOUTH WITH MEALS AND 1 CAP WITH SNACKS x 2 06/26/20  Yes Pickard, Priscille Heidelberg, MD  EQ ALLERGY RELIEF, CETIRIZINE, 10 MG tablet Take 1 tablet by mouth once daily 02/27/22  Yes Donita Brooks, MD  ferrous sulfate 325 (65 FE) MG tablet Take 325 mg by mouth daily with breakfast.   Yes [provider]  FLUoxetine (PROZAC) 20 MG capsule Take 1 capsule (20 mg total) by  mouth daily. 07/05/21  Yes Donita Brooks, MD  furosemide (LASIX) 20 MG tablet Take 1 tablet by mouth once daily 03/11/22  Yes Pickard, Priscille Heidelberg, MD  gabapentin (NEURONTIN) 100 MG capsule TAKE 1 CAPSULE BY MOUTH THREE TIMES DAILY AS NEEDED FOR NERVE PAIN Patient taking differently: Take 100 mg by mouth 3 (three) times daily as needed (nerve pain). 01/30/22  Yes Donita Brooks, MD  insulin NPH-regular Human (NOVOLIN 70/30 RELION) (70-30) 100 UNIT/ML injection INJECT 25 UNITS SUBCUTANEOUSLY IN THE MORNING AND 15 IN THE EVENING Patient taking differently: Inject into the skin See admin instructions. INJECT 42 UNITS SUBCUTANEOUSLY IN THE MORNING AND 22 IN THE EVENING 09/28/19  Yes Pickard, Priscille Heidelberg, MD  LORazepam (ATIVAN) 1 MG tablet TAKE 1/2 TO 1 (ONE-HALF TO ONE) TABLET BY MOUTH TWICE DAILY AS NEEDED Patient taking differently: Take 1 mg by mouth See admin instructions. TAKE 1/2 TO 1 (ONE-HALF TO ONE) TABLET BY MOUTH TWICE DAILY AS NEEDED 02/28/22  Yes Pickard, Priscille Heidelberg, MD  MAGNESIUM-OXIDE 400 (240 Mg) MG tablet TAKE 1 TABLET BY MOUTH ONCE DAILY . APPOINTMENT REQUIRED FOR FUTURE REFILLS Patient taking differently: Take 400 mg by mouth daily. 12/23/21  Yes Branch, Dorothe Pea, MD  metoprolol tartrate (LOPRESSOR) 50 MG tablet Take 1 tablet (50 mg total) by mouth 2 (two) times daily. 03/05/22  Yes Lewie Chamber, MD  Multiple Vitamin (MULTIVITAMIN) capsule Take 1  capsule by mouth every morning.    Yes [provider]  pantoprazole (PROTONIX) 40 MG tablet Take 1 tablet by mouth once daily 11/29/21  Yes Branch, Dorothe Pea, MD  potassium chloride (KLOR-CON) 10 MEQ tablet TAKE 1 TABLET BY MOUTH ONCE DAILY AS NEEDED (TAKE WITH FUROSEMIDE) Patient taking differently: Take 10 mEq by mouth See admin instructions. TAKE 1 TABLET BY MOUTH ONCE DAILY AS NEEDED (TAKE  WITH  FUROSEMIDE) 04/18/21  Yes Donita Brooks, MD  rosuvastatin (CRESTOR) 5 MG tablet Take 1 tablet by mouth once daily 12/16/21  Yes Branch,  Dorothe Pea, MD  triamcinolone cream (KENALOG) 0.1 % Apply topically 2 (two) times daily. 01/10/22  Yes Donita Brooks, MD  BD INSULIN SYRINGE U/F 31G X 5/16" 0.5 ML MISC USE AS DIRECTED TWICE DAILY 02/17/22   Donita Brooks, MD  Blood Glucose Monitoring Suppl (BLOOD GLUCOSE SYSTEM PAK) KIT Please dispense as One Touch Ultra. Use as directed to monitor FSBS 4x daily. Dx: E11.65 11/16/20   Donita Brooks, MD  Lancets Misc. MISC Pt has Accu Chek Aviva Plus - Needs just lancets  Checks BS 4-5 times per day. Disp 3 boxes(90 day Supply)/4 refills Dx code. E11.9 10/13/19   Donita Brooks, MD  OneTouch Delica Lancets 33G MISC CHECK BLOOD SUGAR 4-5 TIMES PER DAY 10/22/20   Donita Brooks, MD  [facility] ULTRA test strip USE 1 STRIP TO CHECK GLUCOSE FIVE TIMES DAILY 01/28/22   Donita Brooks, MD      Allergies    Calcium-containing compounds, Morphine and related, Raloxifene, and Vitamin d analogs    Review of Systems   Review of Systems  Constitutional:  Positive for fatigue. Negative for fever.  Respiratory:  Positive for shortness of breath. Negative for chest tightness.   Cardiovascular:  Negative for chest pain and leg swelling.  Gastrointestinal:  Positive for abdominal distention. Negative for abdominal pain, diarrhea and vomiting.  Genitourinary:  Positive for decreased urine volume. Negative for dysuria.  Neurological:  Negative for syncope and weakness.    Physical Exam Updated Vital Signs BP (!) 146/71   Pulse 62   Temp 97.9 F (36.6 C)   Resp (!) 24   Ht 5\' 3"  (1.6 m)   Wt 70.6 kg   SpO2 96%   BMI 27.57 kg/m  Physical Exam Vitals and nursing note reviewed.  Constitutional:      General: She is not in acute distress.    Appearance: She is not ill-appearing.  HENT:     Mouth/Throat:     Mouth: Mucous membranes are moist.     Pharynx: Oropharynx is clear.  Cardiovascular:     Rate and Rhythm: Normal rate. Rhythm irregularly irregular.     Pulses: Normal pulses.      Heart sounds: Normal heart sounds.  Pulmonary:     Effort: Pulmonary effort is normal. No tachypnea or respiratory distress.     Breath sounds: Normal air entry. Examination of the right-lower field reveals rales. Examination of the left-lower field reveals rales. Rales present.  Abdominal:     General: Abdomen is flat. Bowel sounds are normal. There is distension.     Palpations: Abdomen is soft. There is no fluid wave.     Tenderness: There is no abdominal tenderness.  Musculoskeletal:     Right lower leg: No edema.     Left lower leg: No edema.  Skin:    General: Skin is warm and dry.  Capillary Refill: Capillary refill takes less than 2 seconds.     Coloration: Skin is not cyanotic or pale.  Neurological:     Mental Status: She is alert. Mental status is at baseline.  Psychiatric:        Mood and Affect: Mood normal.        Behavior: Behavior normal.     ED Results / Procedures / Treatments   Labs (all labs ordered are listed, but only abnormal results are displayed) Labs Reviewed  BRAIN NATRIURETIC PEPTIDE - Abnormal; Notable for the following components:      Result Value   B Natriuretic Peptide 759.0 (*)    All other components within normal limits  COMPREHENSIVE METABOLIC PANEL - Abnormal; Notable for the following components:   Sodium 124 (*)    Chloride 85 (*)    Glucose, Bld 127 (*)    Calcium 8.8 (*)    GFR, Estimated 59 (*)    All other components within normal limits  CBC WITH DIFFERENTIAL/PLATELET - Abnormal; Notable for the following components:   Hemoglobin 11.5 (*)    HCT 34.8 (*)    All other components within normal limits  URINALYSIS, ROUTINE W REFLEX MICROSCOPIC - Abnormal; Notable for the following components:   Color, Urine STRAW (*)    All other components within normal limits  MAGNESIUM  TROPONIN I (HIGH SENSITIVITY)  TROPONIN I (HIGH SENSITIVITY)    EKG EKG Interpretation  Date/Time:  Tuesday March 11 2022 14:25:36  EST Ventricular Rate:  70 PR Interval:    QRS Duration: 88 QT Interval:  422 QTC Calculation: 455 R Axis:   -11 Text Interpretation: Atrial fibrillation Septal infarct , age undetermined Abnormal ECG When compared with ECG of 01-Mar-2022 15:19, PREVIOUS ECG IS PRESENT No significant change since last tracing Confirmed by Jacalyn Lefevre [phone]) on 03/11/2022 5:32:49 PM  Radiology DG Chest Portable 1 View  Result Date: 03/11/2022 CLINICAL DATA:  Shortness of breath EXAM: PORTABLE CHEST 1 VIEW COMPARISON:  03/01/2022 FINDINGS: Stable cardiomegaly status post sternotomy and CABG. Mild diffuse interstitial prominence. Moderate left and small right pleural effusions with patchy left basilar opacity. No pneumothorax. IMPRESSION: Findings suggestive of CHF with pulmonary edema. Moderate left and small right pleural effusions with patchy left basilar opacity, likely atelectasis. Electronically Signed   By: Duanne Guess D.O.   On: 03/11/2022 13:41    Procedures Procedures    Medications Ordered in ED Medications  furosemide (LASIX) injection 40 mg (40 mg Intravenous Given 03/11/22 1527)    ED Course/ Medical Decision Making/ A&P                           Medical Decision Making Amount and/or Complexity of Data Reviewed Labs: ordered. Radiology: ordered.   This patient presents to the ED with chief complaint(s) of fatigue, increased work of breathing, abdominal distension with pertinent past medical history of CHF, Afib, HTN .The complaint involves an extensive differential diagnosis and also carries with it a high risk of complications and morbidity.   Patient was recently discharged from hospital earlier this month with acute on chronic CHF exacerbation.   The differential diagnosis includes acute on chronic CHF exacerbation, pneumonia, renal failure, pulmonary hypertension, noncardiogenic pulmonary edema, urinary retention  The initial plan is to obtain CHF workup and  CXR  Additional history obtained: Additional history obtained from family, patient's granddaughter-in-law at bedside Records reviewed previous admission documents  Initial Assessment:   On  exam, patient does not appear to be in acute respiratory distress and is comfortable.  Lung volume reduced with fine rales auscultated in lung bases, more significant on the left.  Mild tachypnea.  Abdomen is mildy distended, and non tender.  Skin is warm and dry.  No significant pedal edema.  She is on O2 via [place].  Heart rate is normal with irregularly irregular rhythm consistent with patient's history of Afib.     Independent ECG/labs interpretation:  The following labs were independently interpreted:  ECG demonstrates Afib without evidence of ischemia or infarction CBC significant for mild anemia, Hgb 11.5; no leukocytosis Metabolic panel significant for hyponatremia and hypochloremia which may be secondary to fluid overload; mild hyperglycemia and mild hypocalcemia Magnesium borderline normal  BNP elevated at 759 Troponin negative UA reveals no evidence of UTI, no proteinuria or ketonuria  Independent visualization and interpretation of imaging: I independently visualized the following imaging with scope of interpretation limited to determining acute life threatening conditions related to emergency care: CXR, which revealed moderate left side pleural effusion, small right side pleural effusion, and left side basilar patchy opacity.   Treatment and Reassessment: Patient treated in ED with 40mg  IV Lasix;  Patient began to have improved urine output.  Abdomen now soft, non distended.  She is still tachypneic with RR 26-28, however, she is able to speak in full sentences.  O2 reduced to 2L/min and she continued to maintain SPO2 >94%.  Auscultation of lung sounds also improved following diuresis.    Discussed with patient and family at bedside patient will likely need hospital admission for continued  monitoring and care given her acute on chronic CHF exacerbation with evidence of fluid overload and electrolyte disturbances.   Consultations obtained:   I requested consultation with on-call hospitalist provider and spoke with, Dr. Mariea Clonts, who will be admitting patient for acute on chronic CHF.   Disposition:   Patient will be admitted to hospital for continued monitoring and treatment of her acute on chronic CHF.            Final Clinical Impression(s) / ED Diagnoses Final diagnoses:  Acute on chronic congestive heart failure, unspecified heart failure type [facility])    Rx / DC Orders ED Discharge Orders     None         Lenard Simmer, [place] 03/11/22 Hoy Morn, MD 03/12/22 [phone]

## 2022-03-11 NOTE — Assessment & Plan Note (Signed)
Stable. -Resume Norvasc, metoprolol.

## 2022-03-11 NOTE — H&P (Incomplete)
History and Physical    Misty Edwards LTJ:[id number] DOB: 12/30/1926 DOA: 03/11/2022  PCP: Susy Frizzle, MD   Patient coming from: Home   I have personally briefly reviewed patient's old medical records in [place]  Chief Complaint: Difficulty breathing, weight gain  HPI: Misty Edwards is a 87 y.o. female with medical history significant for    ED Course: ***  Review of Systems: As per HPI all other systems reviewed and negative.  Past Medical History:  Diagnosis Date   Acute biliary pancreatitis 07/2002   thia was in 07/2004:she still has pseudocyst in tail of pancreas measuring 54 x 33 mm    Adrenal adenoma    bilateral   Anxiety    CAD (coronary artery disease)    a. s/p CABG in 08/2014 with LIMA-LAD, SVG-OM and SVG-PDA   Chronic pancreatitis ([place])    Detached retina    Diabetes mellitus ([place])    Diverticulosis    Heartburn    History of carcinoma in situ of breast 1988   Hyperlipidemia    Hypertension    IBS (irritable bowel syndrome)    Legally blind    Osteoarthritis    Osteopenia    Upper GI bleed September2004   secondary to gastritis    Past Surgical History:  Procedure Laterality Date   CARDIAC CATHETERIZATION N/A 08/24/2014   Procedure: Left Heart Cath and Coronary Angiography;  Surgeon: Leonie Man, MD;  Location: [facility];  Service: Cardiovascular;  Laterality: N/A;   COLONOSCOPY  08/15/05   few tiny diverticula at sigmoid colon/external hemorrhoids but no polyps   COLONOSCOPY  01/08/2012   [id number]:[id number] diverticulosis. Next colonoscopy in 12/2016   CORONARY ARTERY BYPASS GRAFT N/A 08/24/2014   Procedure: CORONARY ARTERY BYPASS GRAFTING (CABG) x three, using left internal mammary artery and right leg greater saphenous vein harvested endoscopically;  Surgeon: Ivin Poot, MD;  Location: [facility];  Service: Open Heart Surgery;  Laterality: N/A;   ECTOPIC PREGNANCY SURGERY  1950's   ERCP with sphincterotomy  07/2002    ESOPHAGOGASTRODUODENOSCOPY      Gastritis of body.  Otherwise normal   ESOPHAGOGASTRODUODENOSCOPY  01/08/2012   RMR: Few scattered gastric erosions of uncertain significance-status post biopsy. Minimal chronic inflammation, no H.pylori   LAPAROSCOPIC CHOLECYSTECTOMY     MASTECTOMY Right 1980   PANCREATIC PSEUDOCYST DRAINAGE  [id number]   drained percutaneously    right mastectomy     tacking up of her bladder     TEE WITHOUT CARDIOVERSION N/A 08/24/2014   Procedure: TRANSESOPHAGEAL ECHOCARDIOGRAM (TEE);  Surgeon: Ivin Poot, MD;  Location: [facility];  Service: Open Heart Surgery;  Laterality: N/A;     reports that she has never smoked. She has never used smokeless tobacco. She reports that she does not drink alcohol and does not use drugs.  Allergies  Allergen Reactions   Calcium-Containing Compounds Nausea Only   Morphine And Related Other (See Comments)    Hallucination   Raloxifene Itching    Evista- Face and eyes burning   Vitamin D Analogs Nausea Only    Family History  Problem Relation Age of Onset   Ovarian cancer Mother        passed   Colon cancer Father 75       passed away in his 34's   Colon cancer Brother 78       surgery inhis 8's doing well now   Heart attack Brother  passed away age 53   Pancreatitis Brother    Kidney disease Daughter    Leukemia Son     Acceptable: Family history reviewed and not pertinent (If you reviewed it)  Prior to Admission medications   Medication Sig Start Date End Date Taking? Authorizing Provider  amiodarone (PACERONE) 200 MG tablet Take 1 tablet twice a day for 3 weeks, then 1 tablet daily 03/05/22  Yes Dwyane Dee, MD  amLODipine (NORVASC) 5 MG tablet TAKE 1 TABLET BY MOUTH ONCE DAILY . APPOINTMENT REQUIRED FOR FUTURE REFILLS Patient taking differently: Take 5 mg by mouth daily. 02/27/22  Yes Branch, Alphonse Guild, MD  apixaban (ELIQUIS) 5 MG TABS tablet Take 1 tablet (5 mg total) by mouth 2 (two) times daily. 03/05/22   Yes Dwyane Dee, MD  calcium carbonate (TUMS - DOSED IN MG ELEMENTAL CALCIUM) 500 MG chewable tablet Chew 3 tablets by mouth at bedtime.   Yes [provider]  CREON 24000-76000 units CPEP TAKE 2 CAPSULES BY MOUTH WITH MEALS AND 1 CAP WITH SNACKS x 2 06/26/20  Yes Pickard, Cammie Mcgee, MD  EQ ALLERGY RELIEF, CETIRIZINE, 10 MG tablet Take 1 tablet by mouth once daily 02/27/22  Yes Susy Frizzle, MD  ferrous sulfate 325 (65 FE) MG tablet Take 325 mg by mouth daily with breakfast.   Yes [provider]  FLUoxetine (PROZAC) 20 MG capsule Take 1 capsule (20 mg total) by mouth daily. 07/05/21  Yes Susy Frizzle, MD  furosemide (LASIX) 20 MG tablet Take 1 tablet by mouth once daily 03/11/22  Yes Pickard, Cammie Mcgee, MD  gabapentin (NEURONTIN) 100 MG capsule TAKE 1 CAPSULE BY MOUTH THREE TIMES DAILY AS NEEDED FOR NERVE PAIN Patient taking differently: Take 100 mg by mouth 3 (three) times daily as needed (nerve pain). 01/30/22  Yes Susy Frizzle, MD  insulin NPH-regular Human (NOVOLIN 70/30 RELION) (70-30) 100 UNIT/ML injection INJECT 25 UNITS SUBCUTANEOUSLY IN THE MORNING AND 15 IN THE EVENING Patient taking differently: Inject into the skin See admin instructions. INJECT 42 UNITS SUBCUTANEOUSLY IN THE MORNING AND 22 IN THE EVENING 09/28/19  Yes Pickard, Cammie Mcgee, MD  LORazepam (ATIVAN) 1 MG tablet TAKE 1/2 TO 1 (ONE-HALF TO ONE) TABLET BY MOUTH TWICE DAILY AS NEEDED Patient taking differently: Take 1 mg by mouth See admin instructions. TAKE 1/2 TO 1 (ONE-HALF TO ONE) TABLET BY MOUTH TWICE DAILY AS NEEDED 02/28/22  Yes Pickard, Cammie Mcgee, MD  MAGNESIUM-OXIDE 400 (240 Mg) MG tablet TAKE 1 TABLET BY MOUTH ONCE DAILY . APPOINTMENT REQUIRED FOR FUTURE REFILLS Patient taking differently: Take 400 mg by mouth daily. 12/23/21  Yes Branch, Alphonse Guild, MD  metoprolol tartrate (LOPRESSOR) 50 MG tablet Take 1 tablet (50 mg total) by mouth 2 (two) times daily. 03/05/22  Yes Dwyane Dee, MD   Multiple Vitamin (MULTIVITAMIN) capsule Take 1 capsule by mouth every morning.    Yes [provider]  pantoprazole (PROTONIX) 40 MG tablet Take 1 tablet by mouth once daily 11/29/21  Yes Branch, Alphonse Guild, MD  potassium chloride (KLOR-CON) 10 MEQ tablet TAKE 1 TABLET BY MOUTH ONCE DAILY AS NEEDED (TAKE WITH FUROSEMIDE) Patient taking differently: Take 10 mEq by mouth See admin instructions. TAKE 1 TABLET BY MOUTH ONCE DAILY AS NEEDED (TAKE  WITH  FUROSEMIDE) 04/18/21  Yes Susy Frizzle, MD  rosuvastatin (CRESTOR) 5 MG tablet Take 1 tablet by mouth once daily 12/16/21  Yes Branch, Alphonse Guild, MD  triamcinolone cream (KENALOG) 0.1 % Apply  topically 2 (two) times daily. 01/10/22  Yes Susy Frizzle, MD  BD INSULIN SYRINGE U/F 31G X 5/16" 0.5 ML MISC USE AS DIRECTED TWICE DAILY 02/17/22   Susy Frizzle, MD  Blood Glucose Monitoring Suppl (BLOOD GLUCOSE SYSTEM PAK) KIT Please dispense as One Touch Ultra. Use as directed to monitor FSBS 4x daily. Dx: E11.65 11/16/20   Susy Frizzle, MD  Lancets Misc. MISC Pt has Accu Chek Aviva Plus - Needs just lancets  Checks BS 4-5 times per day. Disp 3 boxes(90 day Supply)/4 refills Dx code. E11.9 10/13/19   Susy Frizzle, MD  OneTouch Delica Lancets 47M MISC CHECK BLOOD SUGAR 4-5 TIMES PER DAY 10/22/20   Susy Frizzle, MD  [facility] ULTRA test strip USE 1 STRIP TO CHECK GLUCOSE FIVE TIMES DAILY 01/28/22   Susy Frizzle, MD    Physical Exam: Vitals:   03/11/22 1255 03/11/22 1300 03/11/22 1600  BP:  (!) 151/66 (!) 156/71  Pulse:  66 71  Resp:  18 (!) 26  Temp:  97.9 F (36.6 C)   SpO2:  98% 98%  Weight: 70.6 kg    Height: _0  (1.6 m)      Constitutional: NAD, calm, comfortable Vitals:   03/11/22 1255 03/11/22 1300 03/11/22 1600  BP:  (!) 151/66 (!) 156/71  Pulse:  66 71  Resp:  18 (!) 26  Temp:  97.9 F (36.6 C)   SpO2:  98% 98%  Weight: 70.6 kg    Height: _1  (1.6 m)     Eyes: PERRL, lids and conjunctivae  normal ENMT: Mucous membranes are moist. Posterior pharynx clear of any exudate or lesions.Normal dentition.  Neck: normal, supple, no masses, no thyromegaly Respiratory: clear to auscultation bilaterally, no wheezing, no crackles. Normal respiratory effort. No accessory muscle use.  Cardiovascular: Regular rate and rhythm, no murmurs / rubs / gallops. No extremity edema. 2+ pedal pulses. No carotid bruits.  Abdomen: no tenderness, no masses palpated. No hepatosplenomegaly. Bowel sounds positive.  Musculoskeletal: no clubbing / cyanosis. No joint deformity upper and lower extremities. Good ROM, no contractures. Normal muscle tone.  Skin: no rashes, lesions, ulcers. No induration Neurologic: CN 2-12 grossly intact. Sensation intact, DTR normal. Strength 5/5 in all 4.  Psychiatric: Normal judgment and insight. Alert and oriented x 3. Normal mood.   Labs on Admission: I have personally reviewed following labs and imaging studies  CBC: Recent Labs  Lab 03/11/22 1355  WBC 7.6  NEUTROABS 5.8  HGB 11.5*  HCT 34.8*  MCV 83.5  PLT 546   Basic Metabolic Panel: Recent Labs  Lab 03/05/22 0436 03/11/22 1355  NA 136 124*  K 3.7 3.8  CL 99 85*  CO2 29 27  GLUCOSE 119* 127*  BUN 20 23  CREATININE 0.79 0.91  CALCIUM 9.0 8.8*  MG  --  1.7   GFR: Estimated Creatinine Clearance: 36.4 mL/min (by C-G formula based on SCr of 0.91 mg/dL). Liver Function Tests: Recent Labs  Lab 03/11/22 1355  AST 38  ALT 41  ALKPHOS 47  BILITOT 1.1  PROT 7.1  ALBUMIN 3.9   No results for input(s): "LIPASE", "AMYLASE" in the last 168 hours. No results for input(s): "AMMONIA" in the last 168 hours. Coagulation Profile: No results for input(s): "INR", "PROTIME" in the last 168 hours. Cardiac Enzymes: No results for input(s): "CKTOTAL", "CKMB", "CKMBINDEX", "TROPONINI" in the last 168 hours. BNP (last 3 results) No results for input(s): "PROBNP" in the last 8760  hours. HbA1C: No results for input(s):  "HGBA1C" in the last 72 hours. CBG: Recent Labs  Lab 03/04/22 1814 03/04/22 2204 03/05/22 0730 03/05/22 1121  GLUCAP 149* 117* 174* 150*   Lipid Profile: No results for input(s): "CHOL", "HDL", "LDLCALC", "TRIG", "CHOLHDL", "LDLDIRECT" in the last 72 hours. Thyroid Function Tests: No results for input(s): "TSH", "T4TOTAL", "FREET4", "T3FREE", "THYROIDAB" in the last 72 hours. Anemia Panel: No results for input(s): "VITAMINB12", "FOLATE", "FERRITIN", "TIBC", "IRON", "RETICCTPCT" in the last 72 hours. Urine analysis:    Component Value Date/Time   COLORURINE STRAW (A) 03/11/2022 1635   APPEARANCEUR CLEAR 03/11/2022 1635   LABSPEC 1.005 03/11/2022 1635   PHURINE 6.0 03/11/2022 1635   GLUCOSEU NEGATIVE 03/11/2022 1635   HGBUR NEGATIVE 03/11/2022 1635   BILIRUBINUR NEGATIVE 03/11/2022 1635   KETONESUR NEGATIVE 03/11/2022 1635   PROTEINUR NEGATIVE 03/11/2022 1635   UROBILINOGEN 0.2 09/02/2014 1445   NITRITE NEGATIVE 03/11/2022 1635   LEUKOCYTESUR NEGATIVE 03/11/2022 1635    Radiological Exams on Admission: DG Chest Portable 1 View  Result Date: 03/11/2022 CLINICAL DATA:  Shortness of breath EXAM: PORTABLE CHEST 1 VIEW COMPARISON:  03/01/2022 FINDINGS: Stable cardiomegaly status post sternotomy and CABG. Mild diffuse interstitial prominence. Moderate left and small right pleural effusions with patchy left basilar opacity. No pneumothorax. IMPRESSION: Findings suggestive of CHF with pulmonary edema. Moderate left and small right pleural effusions with patchy left basilar opacity, likely atelectasis. Electronically Signed   By: Davina Poke D.O.   On: 03/11/2022 13:41    EKG: Independently reviewed. ***  Assessment/Plan Principal Problem:   Acute on chronic congestive heart failure ([place])  (please populate well all problems here in Problem List. (For example, if patient is on BP meds at home and you resume or decide to hold them, it is a problem that needs to be her. Same for  CAD, COPD, HLD and so on)   ***  Assessment and Plan: No notes have been filed under this hospital service. Service: Hospitalist       DVT prophylaxis: *** (Lovenox/Heparin/SCD's/anticoagulated/None (if comfort care) Code Status: *** (Full/Partial (specify details) Family Communication: *** (Specify name, relationship. Do not write "discussed with patient". Specify tel # if discussed over the phone) Disposition Plan: *** (specify when and where you expect patient to be discharged) Consults called: *** (with names) Admission status: *** (inpatient / obs / tele / medical floor / SDU) I certify that at the point of admission it is my clinical judgment that the patient will require inpatient hospital care spanning beyond 2 midnights from the point of admission due to high intensity of service, high risk for further deterioration and high frequency of surveillance required. The following factors support the patient status of inpatient:    Bethena Roys MD [facility]  03/11/2022, 6:10 PM    Author: Bethena Roys, MD 03/11/2022 6:10 PM  For on call review [email or web address].

## 2022-03-11 NOTE — H&P (Addendum)
History and Physical    Misty Edwards:[id number] DOB: 1926/03/21 DOA: 03/11/2022  PCP: Susy Frizzle, MD   Patient coming from: Home   I have personally briefly reviewed patient's old medical records in [place]  Chief Complaint: Difficulty breathing, weight gain  HPI: Misty Edwards is a 88 y.o. female with medical history significant for CABG, atrial fibrillation, diastolic CHF , atrial fibrillation on anticoagulation, hypertension, chronic pancreatitis. Patient presented to the ED with complaints of weight gain, abdominal swelling over the past 6 days.  She reports some mild lower extremity swelling.  No chest pain.  Recent hospitalization 12/16 to 12/20- for new onset atrial fibrillation.  Started on Eliquis and amiodarone.  She reports compliance with her anticoagulation, on Lasix 20 mg daily.  She reports she has not been putting out a lot of urine.  Patient's son Legrand Como is at bedside.  Reports prior to this recent hospitalization, patient was not on home O2.  She was discharged on 2 L.  Reports her baseline weight is 143- 12/21, and gradually increased to 158 pounds today.  Reports O2 sat 87 to 89% on room air at home.  ED Course: Tmax 97.9.  Heart rate 62-71.  Respirate rate 19- 31.  Blood pressure 140s to 150s.  O2 sats on my evaluation 91 to 94% on room air. BNP 759.  Sodium 124. Chest x-ray findings suggestive of CHF with pulmonary edema, trace left and small right pleural effusions. We Lasix 40 mg x 1 given.    Review of Systems: As per HPI all other systems reviewed and negative.  Past Medical History:  Diagnosis Date   Acute biliary pancreatitis 07/2002   thia was in 07/2004:she still has pseudocyst in tail of pancreas measuring 54 x 33 mm    Adrenal adenoma    bilateral   Anxiety    CAD (coronary artery disease)    a. s/p CABG in 08/2014 with LIMA-LAD, SVG-OM and SVG-PDA   Chronic pancreatitis ([place])    Detached retina    Diabetes mellitus ([place])     Diverticulosis    Heartburn    History of carcinoma in situ of breast 1988   Hyperlipidemia    Hypertension    IBS (irritable bowel syndrome)    Legally blind    Osteoarthritis    Osteopenia    Upper GI bleed September2004   secondary to gastritis    Past Surgical History:  Procedure Laterality Date   CARDIAC CATHETERIZATION N/A 08/24/2014   Procedure: Left Heart Cath and Coronary Angiography;  Surgeon: Leonie Man, MD;  Location: [facility];  Service: Cardiovascular;  Laterality: N/A;   COLONOSCOPY  08/15/05   few tiny diverticula at sigmoid colon/external hemorrhoids but no polyps   COLONOSCOPY  01/08/2012   QPR:FFMBWGY diverticulosis. Next colonoscopy in 12/2016   CORONARY ARTERY BYPASS GRAFT N/A 08/24/2014   Procedure: CORONARY ARTERY BYPASS GRAFTING (CABG) x three, using left internal mammary artery and right leg greater saphenous vein harvested endoscopically;  Surgeon: Ivin Poot, MD;  Location: [facility];  Service: Open Heart Surgery;  Laterality: N/A;   ECTOPIC PREGNANCY SURGERY  1950's   ERCP with sphincterotomy  07/2002   ESOPHAGOGASTRODUODENOSCOPY      Gastritis of body.  Otherwise normal   ESOPHAGOGASTRODUODENOSCOPY  01/08/2012   RMR: Few scattered gastric erosions of uncertain significance-status post biopsy. Minimal chronic inflammation, no H.pylori   LAPAROSCOPIC CHOLECYSTECTOMY     MASTECTOMY Right 1980   PANCREATIC  PSEUDOCYST DRAINAGE  [id number]   drained percutaneously    right mastectomy     tacking up of her bladder     TEE WITHOUT CARDIOVERSION N/A 08/24/2014   Procedure: TRANSESOPHAGEAL ECHOCARDIOGRAM (TEE);  Surgeon: Ivin Poot, MD;  Location: [facility];  Service: Open Heart Surgery;  Laterality: N/A;     reports that she has never smoked. She has never used smokeless tobacco. She reports that she does not drink alcohol and does not use drugs.  Allergies  Allergen Reactions   Calcium-Containing Compounds Nausea Only   Morphine And Related  Other (See Comments)    Hallucination   Raloxifene Itching    Evista- Face and eyes burning   Vitamin D Analogs Nausea Only    Family History  Problem Relation Age of Onset   Ovarian cancer Mother        passed   Colon cancer Father 57       passed away in his 44's   Colon cancer Brother 38       surgery inhis 17's doing well now   Heart attack Brother        passed away age 29   Pancreatitis Brother    Kidney disease Daughter    Leukemia Son    Prior to Admission medications   Medication Sig Start Date End Date Taking? Authorizing Provider  amiodarone (PACERONE) 200 MG tablet Take 1 tablet twice a day for 3 weeks, then 1 tablet daily 03/05/22  Yes Dwyane Dee, MD  amLODipine (NORVASC) 5 MG tablet TAKE 1 TABLET BY MOUTH ONCE DAILY . APPOINTMENT REQUIRED FOR FUTURE REFILLS Patient taking differently: Take 5 mg by mouth daily. 02/27/22  Yes Branch, Alphonse Guild, MD  apixaban (ELIQUIS) 5 MG TABS tablet Take 1 tablet (5 mg total) by mouth 2 (two) times daily. 03/05/22  Yes Dwyane Dee, MD  calcium carbonate (TUMS - DOSED IN MG ELEMENTAL CALCIUM) 500 MG chewable tablet Chew 3 tablets by mouth at bedtime.   Yes [provider]  CREON 24000-76000 units CPEP TAKE 2 CAPSULES BY MOUTH WITH MEALS AND 1 CAP WITH SNACKS x 2 06/26/20  Yes Pickard, Cammie Mcgee, MD  EQ ALLERGY RELIEF, CETIRIZINE, 10 MG tablet Take 1 tablet by mouth once daily 02/27/22  Yes Susy Frizzle, MD  ferrous sulfate 325 (65 FE) MG tablet Take 325 mg by mouth daily with breakfast.   Yes [provider]  FLUoxetine (PROZAC) 20 MG capsule Take 1 capsule (20 mg total) by mouth daily. 07/05/21  Yes Susy Frizzle, MD  furosemide (LASIX) 20 MG tablet Take 1 tablet by mouth once daily 03/11/22  Yes Pickard, Cammie Mcgee, MD  gabapentin (NEURONTIN) 100 MG capsule TAKE 1 CAPSULE BY MOUTH THREE TIMES DAILY AS NEEDED FOR NERVE PAIN Patient taking differently: Take 100 mg by mouth 3 (three) times daily as needed  (nerve pain). 01/30/22  Yes Susy Frizzle, MD  insulin NPH-regular Human (NOVOLIN 70/30 RELION) (70-30) 100 UNIT/ML injection INJECT 25 UNITS SUBCUTANEOUSLY IN THE MORNING AND 15 IN THE EVENING Patient taking differently: Inject into the skin See admin instructions. INJECT 42 UNITS SUBCUTANEOUSLY IN THE MORNING AND 22 IN THE EVENING 09/28/19  Yes Pickard, Cammie Mcgee, MD  LORazepam (ATIVAN) 1 MG tablet TAKE 1/2 TO 1 (ONE-HALF TO ONE) TABLET BY MOUTH TWICE DAILY AS NEEDED Patient taking differently: Take 1 mg by mouth See admin instructions. TAKE 1/2 TO 1 (ONE-HALF TO ONE) TABLET BY MOUTH TWICE DAILY AS NEEDED 02/28/22  Yes Susy Frizzle, MD  MAGNESIUM-OXIDE 400 (240 Mg) MG tablet TAKE 1 TABLET BY MOUTH ONCE DAILY . APPOINTMENT REQUIRED FOR FUTURE REFILLS Patient taking differently: Take 400 mg by mouth daily. 12/23/21  Yes Branch, Alphonse Guild, MD  metoprolol tartrate (LOPRESSOR) 50 MG tablet Take 1 tablet (50 mg total) by mouth 2 (two) times daily. 03/05/22  Yes Dwyane Dee, MD  Multiple Vitamin (MULTIVITAMIN) capsule Take 1 capsule by mouth every morning.    Yes [provider]  pantoprazole (PROTONIX) 40 MG tablet Take 1 tablet by mouth once daily 11/29/21  Yes Branch, Alphonse Guild, MD  potassium chloride (KLOR-CON) 10 MEQ tablet TAKE 1 TABLET BY MOUTH ONCE DAILY AS NEEDED (TAKE WITH FUROSEMIDE) Patient taking differently: Take 10 mEq by mouth See admin instructions. TAKE 1 TABLET BY MOUTH ONCE DAILY AS NEEDED (TAKE  WITH  FUROSEMIDE) 04/18/21  Yes Susy Frizzle, MD  rosuvastatin (CRESTOR) 5 MG tablet Take 1 tablet by mouth once daily 12/16/21  Yes Branch, Alphonse Guild, MD  triamcinolone cream (KENALOG) 0.1 % Apply topically 2 (two) times daily. 01/10/22  Yes Susy Frizzle, MD  BD INSULIN SYRINGE U/F 31G X 5/16" 0.5 ML MISC USE AS DIRECTED TWICE DAILY 02/17/22   Susy Frizzle, MD  Blood Glucose Monitoring Suppl (BLOOD GLUCOSE SYSTEM PAK) KIT Please dispense as One Touch Ultra. Use  as directed to monitor FSBS 4x daily. Dx: E11.65 11/16/20   Susy Frizzle, MD  Lancets Misc. MISC Pt has Accu Chek Aviva Plus - Needs just lancets  Checks BS 4-5 times per day. Disp 3 boxes(90 day Supply)/4 refills Dx code. E11.9 10/13/19   Susy Frizzle, MD  OneTouch Delica Lancets 84Y MISC CHECK BLOOD SUGAR 4-5 TIMES PER DAY 10/22/20   Susy Frizzle, MD  [facility] ULTRA test strip USE 1 STRIP TO CHECK GLUCOSE FIVE TIMES DAILY 01/28/22   Susy Frizzle, MD    Physical Exam: Vitals:   03/11/22 1255 03/11/22 1300 03/11/22 1600  BP:  (!) 151/66 (!) 156/71  Pulse:  66 71  Resp:  18 (!) 26  Temp:  97.9 F (36.6 C)   SpO2:  98% 98%  Weight: 70.6 kg    Height: _0  (1.6 m)      Constitutional: NAD, calm, comfortable Vitals:   03/11/22 1255 03/11/22 1300 03/11/22 1600  BP:  (!) 151/66 (!) 156/71  Pulse:  66 71  Resp:  18 (!) 26  Temp:  97.9 F (36.6 C)   SpO2:  98% 98%  Weight: 70.6 kg    Height: _1  (1.6 m)     Eyes: PERRL, lids and conjunctivae normal ENMT: Mucous membranes are moist.   Neck: normal, supple, no masses, no thyromegaly Respiratory: Crackles at the bases,  Normal respiratory effort. No accessory muscle use.  Cardiovascular: Irregular rate and rhythm, no murmurs / rubs / gallops.  Trace bilateral extremity edema.  Extremities warm. Abdomen: no tenderness, no masses palpated. No hepatosplenomegaly. Bowel sounds positive.  Musculoskeletal: no clubbing / cyanosis. No joint deformity upper and lower extremities.  Skin: no rashes, lesions, ulcers. No induration Neurologic: No apparent cranial nerve abnormalities, moving extremities spontaneously.  Psychiatric: Normal judgment and insight. Alert and oriented x 3. Normal mood.   Labs on Admission: I have personally reviewed following labs and imaging studies  CBC: Recent Labs  Lab 03/11/22 1355  WBC 7.6  NEUTROABS 5.8  HGB 11.5*  HCT 34.8*  MCV 83.5  PLT 214  Basic Metabolic Panel: Recent Labs   Lab 03/05/22 0436 03/11/22 1355  NA 136 124*  K 3.7 3.8  CL 99 85*  CO2 29 27  GLUCOSE 119* 127*  BUN 20 23  CREATININE 0.79 0.91  CALCIUM 9.0 8.8*  MG  --  1.7   GFR: Estimated Creatinine Clearance: 36.4 mL/min (by C-G formula based on SCr of 0.91 mg/dL). Liver Function Tests: Recent Labs  Lab 03/11/22 1355  AST 38  ALT 41  ALKPHOS 47  BILITOT 1.1  PROT 7.1  ALBUMIN 3.9   CBG: Recent Labs  Lab 03/04/22 1814 03/04/22 2204 03/05/22 0730 03/05/22 1121  GLUCAP 149* 117* 174* 150*   Urine analysis:    Component Value Date/Time   COLORURINE STRAW (A) 03/11/2022 1635   APPEARANCEUR CLEAR 03/11/2022 1635   LABSPEC 1.005 03/11/2022 1635   PHURINE 6.0 03/11/2022 1635   GLUCOSEU NEGATIVE 03/11/2022 1635   HGBUR NEGATIVE 03/11/2022 1635   BILIRUBINUR NEGATIVE 03/11/2022 1635   KETONESUR NEGATIVE 03/11/2022 1635   PROTEINUR NEGATIVE 03/11/2022 1635   UROBILINOGEN 0.2 09/02/2014 1445   NITRITE NEGATIVE 03/11/2022 1635   LEUKOCYTESUR NEGATIVE 03/11/2022 1635    Radiological Exams on Admission: DG Chest Portable 1 View  Result Date: 03/11/2022 CLINICAL DATA:  Shortness of breath EXAM: PORTABLE CHEST 1 VIEW COMPARISON:  03/01/2022 FINDINGS: Stable cardiomegaly status post sternotomy and CABG. Mild diffuse interstitial prominence. Moderate left and small right pleural effusions with patchy left basilar opacity. No pneumothorax. IMPRESSION: Findings suggestive of CHF with pulmonary edema. Moderate left and small right pleural effusions with patchy left basilar opacity, likely atelectasis. Electronically Signed   By: Davina Poke D.O.   On: 03/11/2022 13:41    EKG: Independently reviewed.  Atrial fibrillation, rate 70, QTc 455.  No significant change from prior.  Assessment/Plan Principal Problem:   Acute on chronic congestive heart failure (HCC) Active Problems:   Type 2 diabetes mellitus with vascular disease (HCC)   Essential hypertension   Chronic pancreatitis  (HCC)   S/P CABG x 3   Acute on chronic diastolic CHF (congestive heart failure) (HCC)   Atrial fibrillation (HCC)  Assessment and Plan: * Acute on chronic congestive heart failure ([place]) Per patient 15 pound weight gain over the past week.  With abdominal bloating, trace bilateral lower extremity swelling, chest x-ray showing pulmonary edema.  BNP 759.  Recent echo 02/2022, EF of 60 to 65%.  Reports compliance with 20 mg Lasix.  CHF likely exacerbated by new atrial fibrillation -IV Lasix 40 twice daily -Input output, daily weights Daily BMP  Type 2 diabetes mellitus with vascular disease ([place]) -Controlled.  A1c 6. - SSI- S -Resume home 7030 at reduced dose 15 units twice daily ( Home dose 42- a.m, 22 pm)  Atrial fibrillation (HCC) Controlled and on anticoagulation -Resume Eliquis, amiodarone, metoprolol  Hyponatremia Sodium 124 setting of hypervolemia chronic problem. Baseline mid to low 130s. Gradual downtrend in sodium from 134 over the past 2 months. -Urine sodium -Urine osmolality, serum osmolality -Hold Prozac for now  S/P CABG x 3 No chest pain.  Troponin 15 > 13.  EKG without changes. -Resume statins, Eliquis  Chronic pancreatitis (HCC) Resume creon  Essential hypertension Stable. -Resume Norvasc, metoprolol.   DVT prophylaxis: Eliquis Code Status: Full  Family Communication: Son- Hoover Browns and his wifeSharyn Lull at bedside Disposition Plan: ~/ > 2 days Consults called: None  Admission status: Inpt, tele  I certify that at the point of admission it is my clinical judgment  that the patient will require inpatient hospital care spanning beyond 2 midnights from the point of admission due to high intensity of service, high risk for further deterioration and high frequency of surveillance required.    Author: Bethena Roys, MD 03/11/2022 9:55 PM  For on call review [email or web address].

## 2022-03-11 NOTE — Telephone Encounter (Signed)
I spoke with HHN, Tina. I requested she have patient be seen in the ED with an O2 sat of 87-89% on 2 liters oxygen and with labored respirations.Otila Kluver voiced understanding and will have patient seen in ED.

## 2022-03-11 NOTE — ED Triage Notes (Signed)
Pt was seen by home health nurse and pt has increased abdominal swelling. Sometimes pt feels like she can't take a full breath. Pt does have history of chf. BP124/78 SPO2 97% HR 69. PT is normally on 2lnc and had to increase to 4lnc.

## 2022-03-11 NOTE — Assessment & Plan Note (Addendum)
-  Controlled.  A1c 6. - SSI- S -Resume home 7030 at reduced dose 15 units twice daily ( Home dose 42- a.m, 22 pm)

## 2022-03-11 NOTE — Telephone Encounter (Signed)
Spoke to [place] with [facility] who stated that pt has gained fluid over the last few days. HHN stated that pt had some swelling in her abdomen with very little swelling in her feet/legs. Pt's output is between 700-900 mls. O2 is between 87-89% w/ o2 at 2L- pt is having labored respirations.  Weights:  TH- 143.5 lb F- 150 lb SAT- 152 lb SUN- 156 lb TUES- 158 lb  HHN stated pt is compliant with Furosemide 20 mg tablets daily.   Please advise.

## 2022-03-11 NOTE — Assessment & Plan Note (Signed)
Resume creon

## 2022-03-11 NOTE — Telephone Encounter (Signed)
Pt c/o swelling: STAT is pt has developed SOB within 24 hours  If swelling, where is the swelling located? Abdomin and a small amount in legs ans feet  How much weight have you gained and in what time span? 15 lbs since 12//20  Have you gained 3 pounds in a day or 5 pounds in a week? Yes  Do you have a log of your daily weights (if so, list)? No   Are you currently taking a fluid pill? Yes  Are you currently SOB? Yes  Have you traveled recently? No

## 2022-03-11 NOTE — Assessment & Plan Note (Addendum)
Sodium 124 setting of hypervolemia chronic problem. Baseline mid to low 130s. Gradual downtrend in sodium from 134 over the past 2 months. -Urine sodium -Urine osmolality, serum osmolality -Hold Prozac for now

## 2022-03-11 NOTE — Assessment & Plan Note (Addendum)
Controlled and on anticoagulation -Resume Eliquis, amiodarone, metoprolol

## 2022-03-12 ENCOUNTER — Encounter (HOSPITAL_COMMUNITY): Payer: Self-pay | Admitting: Internal Medicine

## 2022-03-12 ENCOUNTER — Telehealth: Payer: Self-pay | Admitting: Cardiology

## 2022-03-12 DIAGNOSIS — Z951 Presence of aortocoronary bypass graft: Secondary | ICD-10-CM

## 2022-03-12 DIAGNOSIS — I5033 Acute on chronic diastolic (congestive) heart failure: Secondary | ICD-10-CM | POA: Diagnosis not present

## 2022-03-12 DIAGNOSIS — I4891 Unspecified atrial fibrillation: Secondary | ICD-10-CM | POA: Diagnosis not present

## 2022-03-12 DIAGNOSIS — E1159 Type 2 diabetes mellitus with other circulatory complications: Secondary | ICD-10-CM | POA: Diagnosis not present

## 2022-03-12 DIAGNOSIS — I509 Heart failure, unspecified: Secondary | ICD-10-CM

## 2022-03-12 LAB — CBC
HCT: 34.6 % — ABNORMAL LOW (ref 36.0–46.0)
Hemoglobin: 11.5 g/dL — ABNORMAL LOW (ref 12.0–15.0)
MCH: 28.1 pg (ref 26.0–34.0)
MCHC: 33.2 g/dL (ref 30.0–36.0)
MCV: 84.6 fL (ref 80.0–100.0)
Platelets: 228 10*3/uL (ref 150–400)
RBC: 4.09 MIL/uL (ref 3.87–5.11)
RDW: 13.6 % (ref 11.5–15.5)
WBC: 7.9 10*3/uL (ref 4.0–10.5)
nRBC: 0 % (ref 0.0–0.2)

## 2022-03-12 LAB — OSMOLALITY: Osmolality: 270 mOsm/kg — ABNORMAL LOW (ref 275–295)

## 2022-03-12 LAB — BASIC METABOLIC PANEL
Anion gap: 13 (ref 5–15)
BUN: 19 mg/dL (ref 8–23)
CO2: 32 mmol/L (ref 22–32)
Calcium: 9.1 mg/dL (ref 8.9–10.3)
Chloride: 88 mmol/L — ABNORMAL LOW (ref 98–111)
Creatinine, Ser: 0.83 mg/dL (ref 0.44–1.00)
GFR, Estimated: >60 mL/min (ref >60)
Glucose, Bld: 141 mg/dL — ABNORMAL HIGH (ref 70–99)
Potassium: 3.4 mmol/L — ABNORMAL LOW (ref 3.5–5.1)
Sodium: 133 mmol/L — ABNORMAL LOW (ref 135–145)

## 2022-03-12 LAB — GLUCOSE, CAPILLARY
Glucose-Capillary: 149 mg/dL — ABNORMAL HIGH (ref 70–99)
Glucose-Capillary: 195 mg/dL — ABNORMAL HIGH (ref 70–99)
Glucose-Capillary: 269 mg/dL — ABNORMAL HIGH (ref 70–99)
Glucose-Capillary: 260 mg/dL — ABNORMAL HIGH (ref 70–99)
Glucose-Capillary: 220 mg/dL — ABNORMAL HIGH (ref 70–99)

## 2022-03-12 LAB — OSMOLALITY, URINE: Osmolality, Ur: 260 mOsm/kg — ABNORMAL LOW (ref 300–900)

## 2022-03-12 MED ORDER — INSULIN ASPART PROT & ASPART (70-30 MIX) 100 UNIT/ML ~~LOC~~ SUSP
15.0000 [IU] | Freq: Two times a day (BID) | SUBCUTANEOUS | Status: DC
  Administered 2022-03-12 – 2022-03-15 (×7): 15 [IU] via SUBCUTANEOUS
  Filled 2022-03-12: qty 10

## 2022-03-12 MED ORDER — APIXABAN 5 MG PO TABS
5.0000 mg | ORAL_TABLET | Freq: Two times a day (BID) | ORAL | Status: DC
  Administered 2022-03-12 – 2022-03-15 (×8): 5 mg via ORAL
  Filled 2022-03-12 (×8): qty 1

## 2022-03-12 MED ORDER — PANTOPRAZOLE SODIUM 40 MG PO TBEC
40.0000 mg | DELAYED_RELEASE_TABLET | Freq: Every day | ORAL | Status: DC
  Administered 2022-03-12 – 2022-03-15 (×4): 40 mg via ORAL
  Filled 2022-03-12 (×4): qty 1

## 2022-03-12 MED ORDER — AMIODARONE HCL 200 MG PO TABS
200.0000 mg | ORAL_TABLET | Freq: Two times a day (BID) | ORAL | Status: DC
  Administered 2022-03-12 – 2022-03-15 (×8): 200 mg via ORAL
  Filled 2022-03-12 (×8): qty 1

## 2022-03-12 MED ORDER — GABAPENTIN 100 MG PO CAPS: 100.0000 mg | ORAL_CAPSULE | Freq: Three times a day (TID) | ORAL | Status: DC | PRN

## 2022-03-12 MED ORDER — PANCRELIPASE (LIP-PROT-AMYL) 12000-38000 UNITS PO CPEP
24000.0000 [IU] | ORAL_CAPSULE | Freq: Three times a day (TID) | ORAL | Status: DC
  Administered 2022-03-12 – 2022-03-15 (×10): 24000 [IU] via ORAL
  Filled 2022-03-12 (×11): qty 2

## 2022-03-12 MED ORDER — INSULIN ASPART 100 UNIT/ML IJ SOLN
0.0000 [IU] | Freq: Three times a day (TID) | INTRAMUSCULAR | Status: DC
  Administered 2022-03-12: 5 [IU] via SUBCUTANEOUS
  Administered 2022-03-12: 2 [IU] via SUBCUTANEOUS
  Administered 2022-03-12: 5 [IU] via SUBCUTANEOUS
  Administered 2022-03-13: 3 [IU] via SUBCUTANEOUS

## 2022-03-12 MED ORDER — ACETAMINOPHEN 650 MG RE SUPP: 650.0000 mg | Freq: Four times a day (QID) | RECTAL | Status: DC | PRN

## 2022-03-12 MED ORDER — ROSUVASTATIN CALCIUM 10 MG PO TABS
5.0000 mg | ORAL_TABLET | Freq: Every day | ORAL | Status: DC
  Administered 2022-03-12 – 2022-03-15 (×4): 5 mg via ORAL
  Filled 2022-03-12 (×4): qty 1

## 2022-03-12 MED ORDER — FUROSEMIDE 10 MG/ML IJ SOLN
40.0000 mg | Freq: Two times a day (BID) | INTRAMUSCULAR | Status: DC
  Administered 2022-03-12 – 2022-03-13 (×3): 40 mg via INTRAVENOUS
  Filled 2022-03-12 (×3): qty 4

## 2022-03-12 MED ORDER — LIVING BETTER WITH HEART FAILURE BOOK: Freq: Once | Status: AC

## 2022-03-12 MED ORDER — AMLODIPINE BESYLATE 5 MG PO TABS
5.0000 mg | ORAL_TABLET | Freq: Every day | ORAL | Status: DC
  Administered 2022-03-12 – 2022-03-15 (×4): 5 mg via ORAL
  Filled 2022-03-12 (×4): qty 1

## 2022-03-12 MED ORDER — METOPROLOL TARTRATE 50 MG PO TABS
50.0000 mg | ORAL_TABLET | Freq: Two times a day (BID) | ORAL | Status: DC
  Administered 2022-03-12 – 2022-03-15 (×8): 50 mg via ORAL
  Filled 2022-03-12 (×8): qty 1

## 2022-03-12 MED ORDER — ONDANSETRON HCL 4 MG PO TABS: 4.0000 mg | ORAL_TABLET | Freq: Four times a day (QID) | ORAL | Status: DC | PRN

## 2022-03-12 MED ORDER — LORAZEPAM 0.5 MG PO TABS
0.5000 mg | ORAL_TABLET | Freq: Two times a day (BID) | ORAL | Status: DC | PRN
  Administered 2022-03-12 – 2022-03-14 (×2): 0.5 mg via ORAL
  Filled 2022-03-12 (×2): qty 1

## 2022-03-12 MED ORDER — ONDANSETRON HCL 4 MG/2ML IJ SOLN
4.0000 mg | Freq: Four times a day (QID) | INTRAMUSCULAR | Status: DC | PRN
  Administered 2022-03-14 (×2): 4 mg via INTRAVENOUS
  Filled 2022-03-12 (×2): qty 2

## 2022-03-12 MED ORDER — INSULIN ASPART 100 UNIT/ML IJ SOLN
0.0000 [IU] | Freq: Every day | INTRAMUSCULAR | Status: DC
  Administered 2022-03-12: 2 [IU] via SUBCUTANEOUS

## 2022-03-12 MED ORDER — ACETAMINOPHEN 325 MG PO TABS: 650.0000 mg | ORAL_TABLET | Freq: Four times a day (QID) | ORAL | Status: DC | PRN

## 2022-03-12 MED ORDER — POTASSIUM CHLORIDE CRYS ER 20 MEQ PO TBCR
40.0000 meq | EXTENDED_RELEASE_TABLET | Freq: Once | ORAL | Status: AC
  Administered 2022-03-12: 40 meq via ORAL
  Filled 2022-03-12: qty 2

## 2022-03-12 MED ORDER — POLYETHYLENE GLYCOL 3350 17 G PO PACK: 17.0000 g | PACK | Freq: Every day | ORAL | Status: DC | PRN

## 2022-03-12 MED ORDER — TRIAMCINOLONE ACETONIDE 0.1 % EX CREA
TOPICAL_CREAM | Freq: Two times a day (BID) | CUTANEOUS | Status: DC
  Filled 2022-03-12: qty 15

## 2022-03-12 NOTE — Progress Notes (Signed)
Patient rested comfortably this shift. No pain noted. Sating at 99% on 3L via nasal cannula. Total of 300 mL of urine output this shift. Warm blanket given. Call light within reach. Will continue to monitor.

## 2022-03-12 NOTE — Progress Notes (Addendum)
PROGRESS NOTE    Misty Edwards  NWG:[id number] DOB: 05/02/1926 DOA: 03/11/2022 PCP: Susy Frizzle, MD    Brief Narrative:  Misty Edwards is a 88 y.o. female with past medical history significant for CABG, atrial fibrillation, diastolic CHF , atrial fibrillation on anticoagulation, hypertension, chronic pancreatitis presented hospital with weight gain, abdominal swelling for 6 days with mild lower extremity edema.  Was recently admitted between 12/16 to 12/20- for new onset atrial fibrillation and was started on Eliquis and amiodarone.  Patient reported compliance with anticoagulation and Lasix.  Of note patient was discharged home recently on 2 L of oxygen.  She presented with increasing weight gain and hypoxia with 87 to 89% on room air at home.  In the ED, patient had mild tachypnea.  BNP was elevated at 759.  Chest x-ray showed pulmonary vascular congestion with trace left and right pleural effusion.  Patient received Lasix 40 mg IV x 1 and was admitted hospital for further evaluation and treatment.  Assessment and plan.  Acute on chronic congestive heart failure ([place]) As per the patient patient had 15 pound weight gain in 1 week.  Chest x-ray with pulm vascular congestion elevated BNP.  Recent 2D echocardiogram from 12/23 showed EF of 60 to 65% continue IV Lasix 40 twice daily.  Spoke with the patient as well as patient's granddaughter at bedside regarding the fluid restriction to 1500 MLS per day and low-salt diet.  Will closely monitor creatinine while on IV diuretics.  Creatinine today at 0.8.  Continue metoprolol, Lasix, amiodarone was on supplemental nasal cannula oxygen which has been weaned off this morning.  Patient is on 20 mg of Lasix daily at home.  Continue strict intake and output charting.   Type 2 diabetes mellitus with vascular disease ([place]) -Controlled.  Hemoglobin A1c at 6.0.  Continue sliding scale insulin, NovoLog 70/30 15 units twice daily.  Atrial fibrillation  ([place]) Controlled, continue Eliquis amiodarone metoprolol. Eliquis, amiodarone, metoprolol   Hyponatremia Sodium of 124 on presentation.  Has improved to 133.  Will continue to monitor.  Holding Prozac for now.  Urinary sodium was 92.  S/P CABG x 3 No chest pain.  Troponin 15 > 13.  EKG without changes. Statins Eliquis.  Hypokalemia.  Mild.  Potassium level of 3.4 today.  Will replenish.  Check BMP in AM.   Chronic pancreatitis ([place]) Resume creon no active issues.   Essential hypertension Continue Norvasc, metoprolol.  Blood pressure slightly elevated but we will continue to monitor with diuretics.   DVT prophylaxis:  apixaban (ELIQUIS) tablet 5 mg   Code Status:     Code Status: Full Code  Disposition: Home with home health likely in 1 to 2 days  Status is: Inpatient Remains inpatient appropriate because: Congestive heart failure, IV Lasix.   Family Communication:  Communicated with the patient's granddaughter at bedside.  Consultants:  None  Procedures:  None  Antimicrobials:  None  Anti-infectives (From admission, onward)    None      Subjective: Today, patient was seen and examined at bedside.  Complains of a mild shortness of breath but better than yesterday.  Denies any orthopnea, chest pain, fever, chills or rigor.  Objective: Vitals:   03/12/22 0138 03/12/22 0449 03/12/22 0457 03/12/22 1000  BP: (!) 159/71 (!) 157/67    Pulse: 70 63    Resp:  20    Temp:  (!) 97.5 F (36.4 C)    TempSrc:  Oral    SpO2:  99%  98%  Weight:   67.9 kg   Height:        Intake/Output Summary (Last 24 hours) at 03/12/2022 1506 Last data filed at 03/12/2022 0457 Gross per 24 hour  Intake --  Output 300 ml  Net -300 ml   Filed Weights   03/11/22 1255 03/12/22 0054 03/12/22 0457  Weight: 70.6 kg 69.9 kg 67.9 kg    Physical Examination: Body mass index is 26.52 kg/m.  General:  Average built, not in obvious distress on room air HENT:   No scleral pallor or  icterus noted. Oral mucosa is moist.  Chest:    Diminished breath sounds bilaterally. CVS: S1 &S2 heard.  Abdomen: Soft, nontender, nondistended.  Bowel sounds are heard.   Extremities: No cyanosis, clubbing bilateral lower extremity pitting edema++ peripheral pulses are palpable. Psych: Alert, awake and oriented, normal mood CNS:  No cranial nerve deficits.  Power equal in all extremities.   Skin: Warm and dry.  No rashes noted.  Data Reviewed:   CBC: Recent Labs  Lab 03/11/22 1355  WBC 7.6  NEUTROABS 5.8  HGB 11.5*  HCT 34.8*  MCV 83.5  PLT 885    Basic Metabolic Panel: Recent Labs  Lab 03/11/22 1355 03/12/22 0333  NA 124* 133*  K 3.8 3.4*  CL 85* 88*  CO2 27 32  GLUCOSE 127* 141*  BUN 23 19  CREATININE 0.91 0.83  CALCIUM 8.8* 9.1  MG 1.7  --     Liver Function Tests: Recent Labs  Lab 03/11/22 1355  AST 38  ALT 41  ALKPHOS 47  BILITOT 1.1  PROT 7.1  ALBUMIN 3.9     Radiology Studies: DG Chest Portable 1 View  Result Date: 03/11/2022 CLINICAL DATA:  Shortness of breath EXAM: PORTABLE CHEST 1 VIEW COMPARISON:  03/01/2022 FINDINGS: Stable cardiomegaly status post sternotomy and CABG. Mild diffuse interstitial prominence. Moderate left and small right pleural effusions with patchy left basilar opacity. No pneumothorax. IMPRESSION: Findings suggestive of CHF with pulmonary edema. Moderate left and small right pleural effusions with patchy left basilar opacity, likely atelectasis. Electronically Signed   By: Davina Poke D.O.   On: 03/11/2022 13:41      LOS: 1 day    Flora Lipps, MD [facility] Available via Epic secure chat 7am-7pm After these hours, please refer to coverage provider listed on [email or web address] 03/12/2022, 3:06 PM

## 2022-03-12 NOTE — Hospital Course (Signed)
Misty Edwards is a 88 y.o. female with past medical history significant for CABG, atrial fibrillation, diastolic CHF , atrial fibrillation on anticoagulation, hypertension, chronic pancreatitis presented hospital with weight gain, abdominal swelling for 6 days with mild lower extremity edema.  Was recently admitted between 12/16 to 12/20- for new onset atrial fibrillation and was started on Eliquis and amiodarone.  Patient reported compliance with anticoagulation and Lasix.  Of note patient was discharged home recently on 2 L of oxygen.  She presented with increasing weight gain and hypoxia with 87 to 89% on room air at home.  In the ED patient had mild tachypnea.  BNP was elevated at 759.  Chest x-ray showed pulmonary vascular congestion with trace left and right pleural effusion.  Patient received Lasix 40 mg IV x 1 and was admitted hospital for further evaluation and treatment.  Assessment and plan.  Acute on chronic congestive heart failure ([place]) As per the patient patient had 15 pound weight gain in 1 week.  Chest x-ray with pulm vascular congestion elevated BNP.  Recent 2D echocardiogram from 1223 showed EF of 60 to 65% continue IV Lasix 40 twice daily.  Intake and output charting, daily weights, daily BMP.   Type 2 diabetes mellitus with vascular disease ([place]) -Controlled.  Globin A1c at 6.0.  Continue sliding scale insulin, reduced dose of in home insulin.  Atrial fibrillation ([place]) Controlled, continue Eliquis amiodarone metoprolol. Eliquis, amiodarone, metoprolol   Hyponatremia Sodium of 124 on presentation.  Has improved to 133.  Will continue to monitor.  Holding Prozac for now.  Urinary sodium was 92.  S/P CABG x 3 No chest pain.  Troponin 15 > 13.  EKG without changes. Statins Eliquis.  Hypokalemia.  Mild.  Will replenish.   Chronic pancreatitis ([place]) Resume creon   Essential hypertension Continue Norvasc, metoprolol.

## 2022-03-12 NOTE — TOC Progression Note (Addendum)
   Transition of Care (TOC) Screening Note   Patient Details  Name: BERNESE DOFFING Date of Birth: 1926/10/22   Transition of Care [facility]) CM/SW Contact:    Boneta Lucks, RN Phone Number: 03/12/2022, 12:48 PM  Active with [facility] for RN/PT/OT  Addendum: New Home health orders placed, CHF book ordered.  Transition of Care Department [facility]) has reviewed patient and no TOC needs have been identified at this time. We will continue to monitor patient advancement through interdisciplinary progression rounds. If new patient transition needs arise, please place a TOC consult.      Barriers to Discharge: Continued Medical Work up  Expected Discharge Plan and Services       Social Determinants of Health (SDOH) Interventions [place]: No Food Insecurity (03/12/2022)  Housing: Low Risk  (03/12/2022)  Transportation Needs: No Transportation Needs (03/12/2022)  Utilities: Not At Risk (03/12/2022)  Depression (PHQ2-9): Low Risk  (12/20/2021)  Tobacco Use: Low Risk  (03/12/2022)    Readmission Risk Interventions    03/05/2022    1:14 PM  Readmission Risk Prevention Plan  Transportation Screening Complete  PCP or Specialist Appt within 5-7 Days Complete  Home Care Screening Complete  Medication Review (RN CM) Complete

## 2022-03-13 ENCOUNTER — Ambulatory Visit: Payer: PPO

## 2022-03-13 DIAGNOSIS — I509 Heart failure, unspecified: Secondary | ICD-10-CM | POA: Diagnosis not present

## 2022-03-13 DIAGNOSIS — I4819 Other persistent atrial fibrillation: Secondary | ICD-10-CM

## 2022-03-13 DIAGNOSIS — Z951 Presence of aortocoronary bypass graft: Secondary | ICD-10-CM | POA: Diagnosis not present

## 2022-03-13 DIAGNOSIS — I4891 Unspecified atrial fibrillation: Secondary | ICD-10-CM | POA: Diagnosis not present

## 2022-03-13 DIAGNOSIS — E1159 Type 2 diabetes mellitus with other circulatory complications: Secondary | ICD-10-CM | POA: Diagnosis not present

## 2022-03-13 DIAGNOSIS — I5033 Acute on chronic diastolic (congestive) heart failure: Secondary | ICD-10-CM | POA: Diagnosis not present

## 2022-03-13 DIAGNOSIS — I340 Nonrheumatic mitral (valve) insufficiency: Secondary | ICD-10-CM

## 2022-03-13 DIAGNOSIS — I5032 Chronic diastolic (congestive) heart failure: Secondary | ICD-10-CM

## 2022-03-13 LAB — BASIC METABOLIC PANEL
Anion gap: 10 (ref 5–15)
BUN: 27 mg/dL — ABNORMAL HIGH (ref 8–23)
CO2: 34 mmol/L — ABNORMAL HIGH (ref 22–32)
Calcium: 8.6 mg/dL — ABNORMAL LOW (ref 8.9–10.3)
Chloride: 87 mmol/L — ABNORMAL LOW (ref 98–111)
Creatinine, Ser: 1.13 mg/dL — ABNORMAL HIGH (ref 0.44–1.00)
GFR, Estimated: 45 mL/min — ABNORMAL LOW (ref >60)
Glucose, Bld: 162 mg/dL — ABNORMAL HIGH (ref 70–99)
Potassium: 3.7 mmol/L (ref 3.5–5.1)
Sodium: 131 mmol/L — ABNORMAL LOW (ref 135–145)

## 2022-03-13 LAB — CBC
HCT: 36.1 % (ref 36.0–46.0)
Hemoglobin: 11.8 g/dL — ABNORMAL LOW (ref 12.0–15.0)
MCH: 28 pg (ref 26.0–34.0)
MCHC: 32.7 g/dL (ref 30.0–36.0)
MCV: 85.7 fL (ref 80.0–100.0)
Platelets: 253 10*3/uL (ref 150–400)
RBC: 4.21 MIL/uL (ref 3.87–5.11)
RDW: 13.9 % (ref 11.5–15.5)
WBC: 8.8 10*3/uL (ref 4.0–10.5)
nRBC: 0 % (ref 0.0–0.2)

## 2022-03-13 LAB — GLUCOSE, CAPILLARY
Glucose-Capillary: 208 mg/dL — ABNORMAL HIGH (ref 70–99)
Glucose-Capillary: 408 mg/dL — ABNORMAL HIGH (ref 70–99)
Glucose-Capillary: 205 mg/dL — ABNORMAL HIGH (ref 70–99)
Glucose-Capillary: 140 mg/dL — ABNORMAL HIGH (ref 70–99)

## 2022-03-13 LAB — MAGNESIUM: Magnesium: 1.6 mg/dL — ABNORMAL LOW (ref 1.7–2.4)

## 2022-03-13 MED ORDER — INSULIN ASPART 100 UNIT/ML IJ SOLN
0.0000 [IU] | Freq: Three times a day (TID) | INTRAMUSCULAR | Status: DC
  Administered 2022-03-13 – 2022-03-14 (×3): 5 [IU] via SUBCUTANEOUS
  Administered 2022-03-14: 3 [IU] via SUBCUTANEOUS
  Administered 2022-03-15: 2 [IU] via SUBCUTANEOUS

## 2022-03-13 MED ORDER — FUROSEMIDE 10 MG/ML IJ SOLN
40.0000 mg | Freq: Every day | INTRAMUSCULAR | Status: DC
  Administered 2022-03-14: 40 mg via INTRAVENOUS
  Filled 2022-03-13: qty 4

## 2022-03-13 MED ORDER — INSULIN ASPART 100 UNIT/ML IJ SOLN
15.0000 [IU] | Freq: Once | INTRAMUSCULAR | Status: AC
  Administered 2022-03-13: 15 [IU] via SUBCUTANEOUS

## 2022-03-13 MED ORDER — MAGNESIUM OXIDE -MG SUPPLEMENT 400 (240 MG) MG PO TABS
400.0000 mg | ORAL_TABLET | Freq: Two times a day (BID) | ORAL | Status: DC
  Administered 2022-03-13 – 2022-03-15 (×5): 400 mg via ORAL
  Filled 2022-03-13 (×5): qty 1

## 2022-03-13 NOTE — Evaluation (Signed)
Physical Therapy Evaluation Patient Details Name: Misty Edwards MRN: [id number] DOB: Sep 23, 1926 Today's Date: 03/13/2022  History of Present Illness  Misty Edwards is a 88 y.o. female with medical history significant for CABG, atrial fibrillation, diastolic CHF , atrial fibrillation on anticoagulation, hypertension, chronic pancreatitis.  Patient presented to the ED with complaints of weight gain, abdominal swelling over the past 6 days.  She reports some mild lower extremity swelling  Clinical Impression  Pt appears to be at previous level. Mod I with bed mobility, Mod I with gait.  Ambulated over 183f.  No skilled PT recommended.        Recommendations for follow up therapy are one component of a multi-disciplinary discharge planning process, led by the attending physician.  Recommendations may be updated based on patient status, additional functional criteria and insurance authorization.  Follow Up Recommendations No PT follow up      Assistance Recommended at Discharge Intermittent Supervision/Assistance  Patient can return home with the following  Assistance with cooking/housework;A lot of help with bathing/dressing/bathroom    Equipment Recommendations None recommended by PT  Recommendations for Other Services       Functional Status Assessment Patient has not had a recent decline in their functional status     Precautions / Restrictions Precautions Precautions: None Restrictions Weight Bearing Restrictions: No      Mobility  Bed Mobility Overal bed mobility: Modified Independent                  Transfers Overall transfer level: Modified independent                      Ambulation/Gait Ambulation/Gait assistance: Modified independent (Device/Increase time) Gait Distance (Feet): 102 Feet Assistive device: Rolling walker (2 wheels) Gait Pattern/deviations: WFL(Within Functional Limits)                    Pertinent Vitals/Pain  Pain Assessment Pain Assessment: No/denies pain    Home Living Family/patient expects to be discharged to:: Private residence Living Arrangements: Alone Available Help at Discharge: Personal care attendant;Family (aide comes in 5 x a week for 4 hours) Type of Home: Mobile home Home Access: Ramped entrance       Home Layout: One level Home Equipment: RConservation officer, nature(2 wheels);Rollator (4 wheels);BSC/3in1      Prior Function Prior Level of Function : Needs assist               ADLs Comments: cooking /cleaning        Extremity/Trunk Assessment        Lower Extremity Assessment Lower Extremity Assessment: Overall WFL for tasks assessed       Communication   Communication: No difficulties        Exercises General Exercises - Lower Extremity Ankle Circles/Pumps: Seated, 10 reps Long Arc Quad: 10 reps Hip ABduction/ADduction: 10 reps Hip Flexion/Marching: 10 reps   Assessment/Plan    PT Assessment Patient does not need any further PT services  PT Problem List Decreased strength       PT Treatment Interventions   No skilled PT needed    PT Goals (Current goals can be found in the Care Plan section)               AM-PAC PT "6 Clicks" Mobility  Outcome Measure Help needed turning from your back to your side while in a flat bed without using bedrails?: None Help needed moving from lying on your back  to sitting on the side of a flat bed without using bedrails?: None Help needed moving to and from a bed to a chair (including a wheelchair)?: None Help needed standing up from a chair using your arms (e.g., wheelchair or bedside chair)?: None Help needed to walk in hospital room?: None Help needed climbing 3-5 steps with a railing? : A Little 6 Click Score: 23    End of Session Equipment Utilized During Treatment: Gait belt Activity Tolerance: Patient tolerated treatment well Patient left: in chair;with call bell/phone within reach Nurse Communication:  Mobility status      Time: 7681-1572 PT Time Calculation (min) (ACUTE ONLY): 26 min   Charges:   PT Evaluation $PT Eval Low Complexity: 1 Low PT Treatments $Gait Training: 8-22 mins        Rayetta Humphrey, PT CLT [phone]  03/13/2022, 10:42 AM

## 2022-03-13 NOTE — Inpatient Diabetes Management (Signed)
Inpatient Diabetes Program Recommendations  AACE/ADA: New Consensus Statement on Inpatient Glycemic Control (2015)  Target Ranges:  Prepandial:   less than 140 mg/dL      Peak postprandial:   less than 180 mg/dL (1-2 hours)      Critically ill patients:  140 - 180 mg/dL   Lab Results  Component Value Date   GLUCAP 408 (H) 03/13/2022   HGBA1C 6.0 (H) 03/02/2022    Review of Glycemic Control  Latest Reference Range & Units 03/12/22 08:10 03/12/22 11:11 03/12/22 16:35 03/12/22 20:48 03/13/22 07:31 03/13/22 12:00  Glucose-Capillary 70 - 99 mg/dL 195 (H) 269 (H) 260 (H) 220 (H) 208 (H) 408 (H)  (H): Data is abnormally high  Diabetes history: DM2 Outpatient Diabetes medications: 70/30 42 units QAM, 22 units QPM Current orders for Inpatient glycemic control: 70/30 15 units BID, Novolog 0-15 units TID & 0-5 units QHS  Inpatient Diabetes Program Recommendations:    Please consider:  70-30 17 units BID  Will continue to follow while inpatient.  Thank you, Reche Dixon, MSN, [place] Diabetes Coordinator Inpatient Diabetes Program [phone] (team pager from 8a-5p)

## 2022-03-13 NOTE — Consult Note (Signed)
CARDIOLOGY CONSULT NOTE       Patient ID: Misty Edwards MRN: [id number] DOB/AGE: 1926/08/13 88 y.o.  Admit date: 03/11/2022 Referring Physician: Denton Brick Primary Physician: Susy Frizzle, MD Primary Cardiologist: Harl Bowie Reason for Consultation: CHF  Principal Problem:   Acute on chronic congestive heart failure [facility]) Active Problems:   Essential hypertension   Chronic pancreatitis ([place])   S/P CABG x 3   Type 2 diabetes mellitus with vascular disease ([place])   Acute on chronic diastolic CHF (congestive heart failure) ([place])   Atrial fibrillation ([place])   HPI:  88 y.o. just d/c from [facility] with afib and CHF. Diuresed and placed on DOAC with rate control. History of CABG in 2016, HTN, HLD, CKD stage 3 TTE with normal EF and moderate MR 03/02/22 She has no chest pain or signs of angina. Sent home with Lasix 20 mg daily Rate control is fine on Lopressor 50 bid and low dose amiodarone 200 bid for 3 weeks then to decrease to daily. D/c weight 70.6 kg Noted more dyspnea and edema  Compliant with meds Trying to watch salt She indicates 15 lb weight gain but admission weight seems the same as 8 days ago CXR with CHF small right pleural effusion BNP 759 This am improved with lasix 40 mg iv daily Weight now 68.1 kg   ROS All other systems reviewed and negative except as noted above  Past Medical History:  Diagnosis Date   Acute biliary pancreatitis 07/2002   thia was in 07/2004:she still has pseudocyst in tail of pancreas measuring 54 x 33 mm    Adrenal adenoma    bilateral   Anxiety    CAD (coronary artery disease)    a. s/p CABG in 08/2014 with LIMA-LAD, SVG-OM and SVG-PDA   Chronic pancreatitis ([place])    Detached retina    Diabetes mellitus ([place])    Diverticulosis    Heartburn    History of carcinoma in situ of breast 1988   Hyperlipidemia    Hypertension    IBS (irritable bowel syndrome)    Legally blind    Osteoarthritis    Osteopenia    Upper GI bleed September2004    secondary to gastritis    Family History  Problem Relation Age of Onset   Ovarian cancer Mother        passed   Colon cancer Father 18       passed away in his 35's   Colon cancer Brother 69       surgery inhis 58's doing well now   Heart attack Brother        passed away age 24   Pancreatitis Brother    Kidney disease Daughter    Leukemia Son     Social History   Socioeconomic History   Marital status: Widowed    Spouse name: Not on file   Number of children: 5   Years of education: Not on file   Highest education level: Not on file  Occupational History   Occupation: homemaker  Tobacco Use   Smoking status: Never   Smokeless tobacco: Never  Vaping Use   Vaping Use: Never used  Substance and Sexual Activity   Alcohol use: No    Alcohol/week: 0.0 standard drinks of alcohol   Drug use: No   Sexual activity: Not Currently    Birth control/protection: Post-menopausal  Other Topics Concern   Not on file  Social History Narrative   Lives in [place].   Social  Determinants of Health   Financial Resource Strain: Not on file  Food Insecurity: No Food Insecurity (03/12/2022)   Hunger Vital Sign    Worried About Running Out of Food in the Last Year: Never true    Ran Out of Food in the Last Year: Never true  Transportation Needs: No Transportation Needs (03/12/2022)   PRAPARE - Hydrologist (Medical): No    Lack of Transportation (Non-Medical): No  Physical Activity: Not on file  Stress: Not on file  Social Connections: Not on file  Intimate Partner Violence: Not At Risk (03/12/2022)   Humiliation, Afraid, Rape, and Kick questionnaire    Fear of Current or Ex-Partner: No    Emotionally Abused: No    Physically Abused: No    Sexually Abused: No    Past Surgical History:  Procedure Laterality Date   CARDIAC CATHETERIZATION N/A 08/24/2014   Procedure: Left Heart Cath and Coronary Angiography;  Surgeon: Leonie Man, MD;  Location: [facility];  Service: Cardiovascular;  Laterality: N/A;   COLONOSCOPY  08/15/05   few tiny diverticula at sigmoid colon/external hemorrhoids but no polyps   COLONOSCOPY  01/08/2012   ZWC:HENIDPO diverticulosis. Next colonoscopy in 12/2016   CORONARY ARTERY BYPASS GRAFT N/A 08/24/2014   Procedure: CORONARY ARTERY BYPASS GRAFTING (CABG) x three, using left internal mammary artery and right leg greater saphenous vein harvested endoscopically;  Surgeon: Ivin Poot, MD;  Location: [place];  Service: Open Heart Surgery;  Laterality: N/A;   ECTOPIC PREGNANCY SURGERY  1950's   ERCP with sphincterotomy  07/2002   ESOPHAGOGASTRODUODENOSCOPY      Gastritis of body.  Otherwise normal   ESOPHAGOGASTRODUODENOSCOPY  01/08/2012   RMR: Few scattered gastric erosions of uncertain significance-status post biopsy. Minimal chronic inflammation, no H.pylori   LAPAROSCOPIC CHOLECYSTECTOMY     MASTECTOMY Right 1980   PANCREATIC PSEUDOCYST DRAINAGE  [id number]   drained percutaneously    right mastectomy     tacking up of her bladder     TEE WITHOUT CARDIOVERSION N/A 08/24/2014   Procedure: TRANSESOPHAGEAL ECHOCARDIOGRAM (TEE);  Surgeon: Ivin Poot, MD;  Location: [place];  Service: Open Heart Surgery;  Laterality: N/A;      Current Facility-Administered Medications:    acetaminophen (TYLENOL) tablet 650 mg, 650 mg, Oral, Q6H PRN **OR** acetaminophen (TYLENOL) suppository 650 mg, 650 mg, Rectal, Q6H PRN, Emokpae, Ejiroghene E, MD   amiodarone (PACERONE) tablet 200 mg, 200 mg, Oral, BID, Emokpae, Ejiroghene E, MD, 200 mg at 03/13/22 0813   amLODipine (NORVASC) tablet 5 mg, 5 mg, Oral, Daily, Emokpae, Ejiroghene E, MD, 5 mg at 03/13/22 0813   apixaban (ELIQUIS) tablet 5 mg, 5 mg, Oral, BID, Emokpae, Ejiroghene E, MD, 5 mg at 03/13/22 0813   furosemide (LASIX) injection 40 mg, 40 mg, Intravenous, Q12H, Emokpae, Ejiroghene E, MD, 40 mg at 03/13/22 0814   gabapentin (NEURONTIN) capsule 100 mg, 100 mg, Oral, TID  PRN, Emokpae, Ejiroghene E, MD   insulin aspart (novoLOG) injection 0-5 Units, 0-5 Units, Subcutaneous, QHS, Emokpae, Ejiroghene E, MD, 2 Units at 03/12/22 2200   insulin aspart (novoLOG) injection 0-9 Units, 0-9 Units, Subcutaneous, TID WC, Emokpae, Ejiroghene E, MD, 3 Units at 03/13/22 0814   insulin aspart protamine- aspart (NOVOLOG MIX 70/30) injection 15 Units, 15 Units, Subcutaneous, BID WC, Emokpae, Ejiroghene E, MD, 15 Units at 03/13/22 0814   lipase/protease/amylase (CREON) capsule 24,000-72,000 Units, 24,000-72,000 Units, Oral, TID AC, Emokpae, Ejiroghene E, MD, 24,000  Units at 03/13/22 0813   LORazepam (ATIVAN) tablet 0.5 mg, 0.5 mg, Oral, BID PRN, Emokpae, Ejiroghene E, MD, 0.5 mg at 03/12/22 2245   metoprolol tartrate (LOPRESSOR) tablet 50 mg, 50 mg, Oral, BID, Emokpae, Ejiroghene E, MD, 50 mg at 03/13/22 0813   ondansetron (ZOFRAN) tablet 4 mg, 4 mg, Oral, Q6H PRN **OR** ondansetron (ZOFRAN) injection 4 mg, 4 mg, Intravenous, Q6H PRN, Emokpae, Ejiroghene E, MD   pantoprazole (PROTONIX) EC tablet 40 mg, 40 mg, Oral, Daily, Emokpae, Ejiroghene E, MD, 40 mg at 03/13/22 0813   polyethylene glycol (MIRALAX / GLYCOLAX) packet 17 g, 17 g, Oral, Daily PRN, Emokpae, Ejiroghene E, MD   rosuvastatin (CRESTOR) tablet 5 mg, 5 mg, Oral, Daily, Emokpae, Ejiroghene E, MD, 5 mg at 03/13/22 0813   triamcinolone cream (KENALOG) 0.1 % cream, , Topical, BID, Pokhrel, Laxman, MD, Given at 03/13/22 0815  amiodarone  200 mg Oral BID   amLODipine  5 mg Oral Daily   apixaban  5 mg Oral BID   furosemide  40 mg Intravenous Q12H   insulin aspart  0-5 Units Subcutaneous QHS   insulin aspart  0-9 Units Subcutaneous TID WC   insulin aspart protamine- aspart  15 Units Subcutaneous BID WC   lipase/protease/amylase  24,000-72,000 Units Oral TID AC   metoprolol tartrate  50 mg Oral BID   pantoprazole  40 mg Oral Daily   rosuvastatin  5 mg Oral Daily   triamcinolone cream   Topical BID     Physical Exam: Blood  pressure 126/66, pulse 66, temperature (!) 97.4 F (36.3 C), resp. rate 17, height '5\' 3"'$  (1.6 m), weight 68.1 kg, SpO2 100 %.  Elderly female Poor dentition  Eating breakfast Sats fine basilar crackles RLL atelectasis clears with deep inspiration  MR apical murmur post sternotomy  Abdomen benign Trace edema   Labs:   Lab Results  Component Value Date   WBC 7.9 03/12/2022   HGB 11.5 (L) 03/12/2022   HCT 34.6 (L) 03/12/2022   MCV 84.6 03/12/2022   PLT 228 03/12/2022    Recent Labs  Lab 03/11/22 1355 03/12/22 0333 03/13/22 0315  NA 124*   < > 131*  K 3.8   < > 3.7  CL 85*   < > 87*  CO2 27   < > 34*  BUN 23   < > 27*  CREATININE 0.91   < > 1.13*  CALCIUM 8.8*   < > 8.6*  PROT 7.1  --   --   BILITOT 1.1  --   --   ALKPHOS 47  --   --   ALT 41  --   --   AST 38  --   --   GLUCOSE 127*   < > 162*   < > = values in this interval not displayed.   Lab Results  Component Value Date   CKTOTAL 40 09/05/2018   TROPONINI <0.03 10/24/2016    Lab Results  Component Value Date   CHOL 107 11/29/2021   CHOL 106 08/01/2016   CHOL 125 11/22/2015   Lab Results  Component Value Date   HDL 51 11/29/2021   HDL 67 08/01/2016   HDL 78 11/22/2015   Lab Results  Component Value Date   LDLCALC 36 11/29/2021   LDLCALC 22 08/01/2016   LDLCALC 28 11/22/2015   Lab Results  Component Value Date   TRIG 99 11/29/2021   TRIG 85 08/01/2016   TRIG 95 11/22/2015  Lab Results  Component Value Date   CHOLHDL 2.1 11/29/2021   CHOLHDL 1.6 08/01/2016   CHOLHDL 1.6 11/22/2015   No results found for: "LDLDIRECT"    Radiology: DG Chest Portable 1 View  Result Date: 03/11/2022 CLINICAL DATA:  Shortness of breath EXAM: PORTABLE CHEST 1 VIEW COMPARISON:  03/01/2022 FINDINGS: Stable cardiomegaly status post sternotomy and CABG. Mild diffuse interstitial prominence. Moderate left and small right pleural effusions with patchy left basilar opacity. No pneumothorax. IMPRESSION: Findings  suggestive of CHF with pulmonary edema. Moderate left and small right pleural effusions with patchy left basilar opacity, likely atelectasis. Electronically Signed   By: Davina Poke D.O.   On: 03/11/2022 13:41   ECHOCARDIOGRAM COMPLETE  Result Date: 03/02/2022    ECHOCARDIOGRAM REPORT   Patient Name:   Misty Edwards Date of Exam: 03/02/2022 Medical Rec #:  [id number]       Height:       63.0 in Accession #:    [id number]      Weight:       154.8 lb Date of Birth:  Dec 30, 1926       BSA:          1.734 m Patient Age:    79 years        BP:           148/71 mmHg Patient Gender: F               HR:           76 bpm. Exam Location:  Inpatient Procedure: 2D Echo, Cardiac Doppler and Color Doppler Indications:    I50.40* Unspecified combined systolic (congestive) and diastolic                 (congestive) heart failure; I48.91* Unspeicified atrial                 fibrillation  History:        Patient has prior history of Echocardiogram examinations, most                 recent 09/25/2019. CHF, CAD and Previous Myocardial Infarction,                 Abnormal ECG and Prior CABG, Arrythmias:Atrial Fibrillation;                 Risk Factors:Diabetes and Dyslipidemia. Cancer.  Sonographer:    Roseanna Rainbow RDCS Referring Phys: [phone] JACOB J STINSON  Sonographer Comments: Technically difficult study due to poor echo windows. IMPRESSIONS  1. Left ventricular ejection fraction, by estimation, is 60 to 65%. The left ventricle has normal function. The left ventricle demonstrates regional wall motion abnormalities (see scoring diagram/findings for description). There is moderate asymmetric left ventricular hypertrophy of the basal-septal segment. Left ventricular diastolic parameters are indeterminate.  2. Right ventricular systolic function is mildly reduced. The right ventricular size is normal. There is normal pulmonary artery systolic pressure. The estimated right ventricular systolic pressure is 40.3 mmHg.  3. Left atrial  size was moderately dilated.  4. At least moderate mitral regurgitation with horizontal color splay and with Doppler evidnece of right sided pulmonary vein flow reversal. The mitral valve is normal in structure. Moderate mitral valve regurgitation.  5. The aortic valve is tricuspid. Aortic valve regurgitation is trivial. No aortic stenosis is present. Comparison(s): Mitral regurgitation has increased. FINDINGS  Left Ventricle: Left ventricular ejection fraction, by estimation, is 60 to 65%. The left ventricle has normal function.  The left ventricle demonstrates regional wall motion abnormalities. The left ventricular internal cavity size was normal in size. There is moderate asymmetric left ventricular hypertrophy of the basal-septal segment. Left ventricular diastolic parameters are indeterminate.  LV Wall Scoring: The apical septal segment is hypokinetic. Right Ventricle: The right ventricular size is normal. No increase in right ventricular wall thickness. Right ventricular systolic function is mildly reduced. There is normal pulmonary artery systolic pressure. The tricuspid regurgitant velocity is 2.44 m/s, and with an assumed right atrial pressure of 8 mmHg, the estimated right ventricular systolic pressure is 63.0 mmHg. Left Atrium: Left atrial size was moderately dilated. Right Atrium: Right atrial size was normal in size. Pericardium: There is no evidence of pericardial effusion. Mitral Valve: At least moderate mitral regurgitation with horizontal color splay and with Doppler evidnece of right sided pulmonary vein flow reversal. The mitral valve is normal in structure. Moderate mitral valve regurgitation. MV peak gradient, 10.1 mmHg. The mean mitral valve gradient is 3.0 mmHg. Tricuspid Valve: The tricuspid valve is normal in structure. Tricuspid valve regurgitation is trivial. No evidence of tricuspid stenosis. Aortic Valve: The aortic valve is tricuspid. Aortic valve regurgitation is trivial. No aortic  stenosis is present. Pulmonic Valve: The pulmonic valve was not well visualized. Pulmonic valve regurgitation is mild. No evidence of pulmonic stenosis. Aorta: The aortic root and ascending aorta are structurally normal, with no evidence of dilitation. IAS/Shunts: No atrial level shunt detected by color flow Doppler.  LEFT VENTRICLE PLAX 2D LVIDd:         4.10 cm LVIDs:         2.60 cm LV PW:         0.90 cm LV IVS:        1.10 cm LVOT diam:     2.30 cm LV SV:         83 LV SV Index:   48 LVOT Area:     4.15 cm  RIGHT VENTRICLE            IVC RV S prime:     5.87 cm/s  IVC diam: 2.30 cm TAPSE (M-mode): 0.7 cm LEFT ATRIUM             Index        RIGHT ATRIUM           Index LA diam:        4.40 cm 2.54 cm/m   RA Area:     15.70 cm LA Vol (A2C):   56.0 ml 32.30 ml/m  RA Volume:   38.30 ml  22.09 ml/m LA Vol (A4C):   56.6 ml 32.61 ml/m LA Biplane Vol: 68.1 ml 39.27 ml/m  AORTIC VALVE             PULMONIC VALVE LVOT Vmax:   114.00 cm/s PR End Diast Vel: 3.00 msec LVOT Vmean:  75.700 cm/s LVOT VTI:    0.199 m  AORTA Ao Root diam: 2.60 cm Ao Asc diam:  2.50 cm MITRAL VALVE                  TRICUSPID VALVE MV Area (PHT): 3.99 cm       TR Peak grad:   23.8 mmHg MV Area VTI:   2.41 cm       TR Vmax:        244.00 cm/s MV Peak grad:  10.1 mmHg MV Mean grad:  3.0 mmHg       SHUNTS MV Vmax:  1.59 m/s       Systemic VTI:  0.20 m MV Vmean:      82.1 cm/s      Systemic Diam: 2.30 cm MV Decel Time: 190 msec MR Peak grad:    83.5 mmHg MR Mean grad:    58.0 mmHg MR Vmax:         457.00 cm/s MR Vmean:        355.0 cm/s MR PISA:         1.01 cm MR PISA Eff ROA: 9 mm MR PISA Radius:  0.40 cm MV E velocity: 142.00 cm/s Rudean Haskell MD Electronically signed by Rudean Haskell MD Signature Date/Time: 03/02/2022/6:51:46 PM    Final    DG Chest Port 1 View  Result Date: 03/01/2022 CLINICAL DATA:  Shortness of breath EXAM: PORTABLE CHEST 1 VIEW COMPARISON:  09/25/2019 FINDINGS: Enlargement of the cardiac  silhouette, increased from prior. Prior sternotomy and CABG. Diffusely prominent interstitial markings bilaterally with streaky bibasilar opacities. Possible trace pleural effusions. No pneumothorax. IMPRESSION: 1. Findings suggestive of CHF with pulmonary edema. 2. Enlargement of the cardiac silhouette, increased from prior. Underlying pericardial effusion not excluded. Electronically Signed   By: Davina Poke D.O.   On: 03/01/2022 15:40    EKG: afib nonspecific ST changes    ASSESSMENT AND PLAN:   CHF:  diastolic with moderate MR and afib. Continue iv lasix today weight down edema improved  MR:  moderate with splay artifact given age not a candidate for any TEER or clip procedure and not really severe  Afib:  continue lopressor amiodarone rate control is fine Normal Cr and weight > 60 kg so eliquis 5 mg bid appropriate dose despite age Last fall 3 years ago no bleeding issues  HTN:  Well controlled.  Continue current medications and low sodium Dash type diet.   CABG:  no angina or ischemic ECG changes normal EF continue statin and beta blocker   Signed: Jenkins Rouge 03/13/2022, 8:27 AM

## 2022-03-13 NOTE — Progress Notes (Signed)
PROGRESS NOTE    Misty Edwards  AQT:[id number] DOB: 23-Mar-1926 DOA: 03/11/2022 PCP: Susy Frizzle, MD    Brief Narrative:  Misty Edwards is a 88 y.o. female with past medical history significant for CABG, atrial fibrillation, diastolic CHF , atrial fibrillation on anticoagulation, hypertension, chronic pancreatitis presented hospital with weight gain, abdominal swelling for 6 days with mild lower extremity edema.  Patient was recently admitted between 12/16 to 12/20- for new onset atrial fibrillation and was started on Eliquis and amiodarone.  Patient reported compliance with anticoagulation and Lasix.  Of note patient was discharged home recently on 2 L of oxygen.  She presented with increasing weight gain and hypoxia with 87 to 89% on room air at home.  In the ED, patient had mild tachypnea.  BNP was elevated at 759.  Chest x-ray showed pulmonary vascular congestion with trace left and right pleural effusion.  Patient received Lasix 40 mg IV x 1 and was admitted hospital for further evaluation and treatment.  Assessment and plan.  Acute on chronic congestive heart failure ([place]) As per the patient patient had 15 pound weight gain in 1 week.  Chest x-ray with pulm vascular congestion elevated BNP.  Recent 2D echocardiogram from 12/23 showed EF of 60 to 65% continue IV Lasix 40 twice daily.  Reinforced fluid restriction to 1500 MLS per day and low-salt diet.  W Creatinine today at 0.8.  Continue metoprolol, Lasix, amiodarone.  Will change her Lasix to once daily from today due to rising creatinine level.  Patient is on 20 mg of Lasix daily at home.  Continue strict intake and output charting.  Patient was back on oxygen today but will continue to wean off again.   Type 2 diabetes mellitus with vascular disease with hyperglycemia. -Controlled.  Hemoglobin A1c at 6.0.  Continue sliding scale insulin, NovoLog 70/30, 15 units twice daily.  Atrial fibrillation ([place]) Controlled, continue Eliquis  amiodarone metoprolol. Eliquis, amiodarone, metoprolol   Hyponatremia Likely hypervolemic hyponatremia.  Sodium of 124 on presentation.  Has improved to 131.  Will continue to monitor.  Holding Prozac for now.  Urinary sodium was 92.  S/P CABG x 3 No chest pain.  Troponin 15 > 13.  EKG without changes. Statins Eliquis.  Hypokalemia.  Improved after replacement.  Will continue to replenish while on diuretics.  Mild hypomagnesemia.  Will replenish.   Chronic pancreatitis ([place]) Resume creon no active issues.   Essential hypertension Continue Norvasc, metoprolol.  Blood pressure better today.   DVT prophylaxis:  apixaban (ELIQUIS) tablet 5 mg   Code Status:     Code Status: Full Code  Disposition: Home likely 03/14/2022.  PT did not recommend specific needs on discharge.  Status is: Inpatient  Remains inpatient appropriate because: Congestive heart failure, IV Lasix.   Family Communication:  Communicated with the patient's granddaughter at bedside on 03/12/2022.  Consultants:  Cardiology  Procedures:  None  Antimicrobials:  None  Anti-infectives (From admission, onward)    None      Subjective: Today, patient was seen and examined at bedside.  Feels little better with breathing today.  Has been diuresing well.  Denies any chest pain, dizziness, lightheadedness.  No fever or chills.   Objective: Vitals:   03/12/22 1000 03/12/22 1641 03/12/22 2215 03/13/22 0523  BP:  (!) 142/57 (!) 151/62 126/66  Pulse:  67 64 66  Resp:  (!) '21 18 17  '$ Temp:  98.2 F (36.8 C) 98 F (36.7 C) (!) 97.4  F (36.3 C)  TempSrc:  Oral    SpO2: 98% (!) 83% 94% 100%  Weight:    68.1 kg  Height:        Intake/Output Summary (Last 24 hours) at 03/13/2022 1148 Last data filed at 03/13/2022 0935 Gross per 24 hour  Intake 480 ml  Output 1800 ml  Net -1320 ml    Filed Weights   03/12/22 0054 03/12/22 0457 03/13/22 0523  Weight: 69.9 kg 67.9 kg 68.1 kg    Physical  Examination: Body mass index is 26.59 kg/m.  General:  Average built, not in obvious distress, on room air HENT:   No scleral pallor or icterus noted. Oral mucosa is moist.  Chest:    Diminished breath sounds bilaterally.  No obvious crackles or wheezes. CVS: S1 &S2 heard.  Abdomen: Soft, nontender, nondistended.  Bowel sounds are heard.   Extremities: No cyanosis, clubbing bilateral lower extremity pitting edema+ improved.  Peripheral pulses are palpable. Psych: Alert, awake and medicated, CNS:  No cranial nerve deficits.  Power equal in all extremities.   Skin: Warm and dry.  No rashes noted.  Data Reviewed:   CBC: Recent Labs  Lab 03/11/22 1355 03/12/22 1545  WBC 7.6 7.9  NEUTROABS 5.8  --   HGB 11.5* 11.5*  HCT 34.8* 34.6*  MCV 83.5 84.6  PLT 214 228     Basic Metabolic Panel: Recent Labs  Lab 03/11/22 1355 03/12/22 0333 03/13/22 0315  NA 124* 133* 131*  K 3.8 3.4* 3.7  CL 85* 88* 87*  CO2 27 32 34*  GLUCOSE 127* 141* 162*  BUN 23 19 27*  CREATININE 0.91 0.83 1.13*  CALCIUM 8.8* 9.1 8.6*  MG 1.7  --  1.6*     Liver Function Tests: Recent Labs  Lab 03/11/22 1355  AST 38  ALT 41  ALKPHOS 47  BILITOT 1.1  PROT 7.1  ALBUMIN 3.9      Radiology Studies: DG Chest Portable 1 View  Result Date: 03/11/2022 CLINICAL DATA:  Shortness of breath EXAM: PORTABLE CHEST 1 VIEW COMPARISON:  03/01/2022 FINDINGS: Stable cardiomegaly status post sternotomy and CABG. Mild diffuse interstitial prominence. Moderate left and small right pleural effusions with patchy left basilar opacity. No pneumothorax. IMPRESSION: Findings suggestive of CHF with pulmonary edema. Moderate left and small right pleural effusions with patchy left basilar opacity, likely atelectasis. Electronically Signed   By: Davina Poke D.O.   On: 03/11/2022 13:41      LOS: 2 days    Flora Lipps, MD [facility] Available via Epic secure chat 7am-7pm After these hours, please refer to  coverage provider listed on [email or web address] 03/13/2022, 11:48 AM

## 2022-03-14 DIAGNOSIS — Z951 Presence of aortocoronary bypass graft: Secondary | ICD-10-CM | POA: Diagnosis not present

## 2022-03-14 DIAGNOSIS — I482 Chronic atrial fibrillation, unspecified: Secondary | ICD-10-CM

## 2022-03-14 DIAGNOSIS — I4891 Unspecified atrial fibrillation: Secondary | ICD-10-CM | POA: Diagnosis not present

## 2022-03-14 DIAGNOSIS — I5033 Acute on chronic diastolic (congestive) heart failure: Secondary | ICD-10-CM | POA: Diagnosis not present

## 2022-03-14 DIAGNOSIS — I340 Nonrheumatic mitral (valve) insufficiency: Secondary | ICD-10-CM | POA: Diagnosis not present

## 2022-03-14 DIAGNOSIS — I509 Heart failure, unspecified: Secondary | ICD-10-CM | POA: Diagnosis not present

## 2022-03-14 DIAGNOSIS — E1159 Type 2 diabetes mellitus with other circulatory complications: Secondary | ICD-10-CM | POA: Diagnosis not present

## 2022-03-14 LAB — BASIC METABOLIC PANEL
Anion gap: 12 (ref 5–15)
BUN: 25 mg/dL — ABNORMAL HIGH (ref 8–23)
CO2: 31 mmol/L (ref 22–32)
Calcium: 8.8 mg/dL — ABNORMAL LOW (ref 8.9–10.3)
Chloride: 88 mmol/L — ABNORMAL LOW (ref 98–111)
Creatinine, Ser: 0.94 mg/dL (ref 0.44–1.00)
GFR, Estimated: 57 mL/min — ABNORMAL LOW (ref >60)
Glucose, Bld: 128 mg/dL — ABNORMAL HIGH (ref 70–99)
Potassium: 3.8 mmol/L (ref 3.5–5.1)
Sodium: 131 mmol/L — ABNORMAL LOW (ref 135–145)

## 2022-03-14 LAB — GLUCOSE, CAPILLARY
Glucose-Capillary: 216 mg/dL — ABNORMAL HIGH (ref 70–99)
Glucose-Capillary: 198 mg/dL — ABNORMAL HIGH (ref 70–99)
Glucose-Capillary: 212 mg/dL — ABNORMAL HIGH (ref 70–99)
Glucose-Capillary: 109 mg/dL — ABNORMAL HIGH (ref 70–99)

## 2022-03-14 LAB — MAGNESIUM: Magnesium: 1.9 mg/dL (ref 1.7–2.4)

## 2022-03-14 MED ORDER — ALUM & MAG HYDROXIDE-SIMETH 200-200-20 MG/5ML PO SUSP
30.0000 mL | Freq: Four times a day (QID) | ORAL | Status: DC | PRN
  Administered 2022-03-14: 30 mL via ORAL
  Filled 2022-03-14: qty 30

## 2022-03-14 MED ORDER — FUROSEMIDE 10 MG/ML IJ SOLN
40.0000 mg | Freq: Two times a day (BID) | INTRAMUSCULAR | Status: DC
  Administered 2022-03-14 – 2022-03-15 (×2): 40 mg via INTRAVENOUS
  Filled 2022-03-14 (×2): qty 4

## 2022-03-14 NOTE — Progress Notes (Signed)
   Primary Cardiologist:  Branch  Subjective:  Denies SSCP, palpitations or Dyspnea Not feeling well stomach upset and weak   Objective:  Vitals:   03/13/22 1655 03/13/22 1952 03/14/22 0439 03/14/22 0537  BP:  (!) 130/57  (!) 150/84  Pulse: 68 69  73  Resp:  18  18  Temp:  98.1 F (36.7 C)  98.4 F (36.9 C)  TempSrc:  Oral    SpO2: 92% 99%  100%  Weight:   68.4 kg   Height:        Intake/Output from previous day:  Intake/Output Summary (Last 24 hours) at 03/14/2022 0821 Last data filed at 03/13/2022 1700 Gross per 24 hour  Intake 960 ml  Output 800 ml  Net 160 ml    Physical Exam:  Frail elderly female Poor dentition Basilar crackles  Apical MR murmur post sternotomy  Abdomen benign  Trace edema  Lab Results: Basic Metabolic Panel: Recent Labs    03/13/22 0315 03/14/22 0313  NA 131* 131*  K 3.7 3.8  CL 87* 88*  CO2 34* 31  GLUCOSE 162* 128*  BUN 27* 25*  CREATININE 1.13* 0.94  CALCIUM 8.6* 8.8*  MG 1.6* 1.9   Liver Function Tests: Recent Labs    03/11/22 1355  AST 38  ALT 41  ALKPHOS 47  BILITOT 1.1  PROT 7.1  ALBUMIN 3.9   No results for input(s): "LIPASE", "AMYLASE" in the last 72 hours. CBC: Recent Labs    03/11/22 1355 03/12/22 1545 03/13/22 1328  WBC 7.6 7.9 8.8  NEUTROABS 5.8  --   --   HGB 11.5* 11.5* 11.8*  HCT 34.8* 34.6* 36.1  MCV 83.5 84.6 85.7  PLT 214 228 253     Imaging: No results found.  Cardiac Studies:  ECG: afib rate 70 no acute changes    Telemetry:  afib   Echo: 12/17 EF 60-65% moderate MR  Medications:    amiodarone  200 mg Oral BID   amLODipine  5 mg Oral Daily   apixaban  5 mg Oral BID   furosemide  40 mg Intravenous Daily   insulin aspart  0-15 Units Subcutaneous TID WC   insulin aspart  0-5 Units Subcutaneous QHS   insulin aspart protamine- aspart  15 Units Subcutaneous BID WC   lipase/protease/amylase  24,000-72,000 Units Oral TID AC   magnesium oxide  400 mg Oral BID   metoprolol  tartrate  50 mg Oral BID   pantoprazole  40 mg Oral Daily   rosuvastatin  5 mg Oral Daily   triamcinolone cream   Topical BID      Assessment/Plan:  CHF:  diastolic with moderate MR and afib. Continue iv lasix today weight down edema improved  MR:  moderate with splay artifact given age not a candidate for any TEER or clip procedure and not really severe  Afib:  continue lopressor rate control is fine Normal Cr and weight > 60 kg so eliquis 5 mg bid appropriate dose despite age Last fall 3 years ago no bleeding issues D/c amiodarone with GI upset and chronic nature of afib HTN:  Well controlled.  Continue current medications and low sodium Dash type diet.   CABG:  no angina or ischemic ECG changes normal EF continue statin and beta blocker   Jenkins Rouge 03/14/2022, 8:21 AM

## 2022-03-14 NOTE — Progress Notes (Signed)
Patient slept on and off throughout the night. No complaints of pain or discomfort at this time.

## 2022-03-14 NOTE — Care Management Important Message (Signed)
Important Message  Patient Details  Name: Misty Edwards MRN: [id number] Date of Birth: June 18, 1926   Medicare Important Message Given:  Yes     [name] 03/14/2022, 1:50 PM

## 2022-03-14 NOTE — Progress Notes (Signed)
PROGRESS NOTE    Misty Edwards  GYI:[id number] DOB: 17-Jul-1926 DOA: 03/11/2022 PCP: Susy Frizzle, MD    Brief Narrative:  Misty Edwards is a 88 y.o. female with past medical history significant for CABG, atrial fibrillation, diastolic CHF , atrial fibrillation on anticoagulation, hypertension, chronic pancreatitis presented hospital with weight gain, abdominal swelling for 6 days with mild lower extremity edema.  Patient was recently admitted between 12/16 to 12/20- for new onset atrial fibrillation and was started on Eliquis and amiodarone.  Patient reported compliance with anticoagulation and Lasix.  Of note patient was discharged home recently on 2 L of oxygen.  She presented with increasing weight gain and hypoxia with 87 to 89% on room air at home.  In the ED, patient had mild tachypnea.  BNP was elevated at 759.  Chest x-ray showed pulmonary vascular congestion with trace left and right pleural effusion.  Patient received Lasix 40 mg IV x 1 and was admitted hospital for further evaluation and treatment.  Assessment and plan.  Acute on chronic congestive heart failure ([place]) As per the patient patient had 15 pound weight gain in 1 week prior to presentation..  Chest x-ray with pulm vascular congestion elevated BNP.  Recent 2D echocardiogram from 12/23 showed EF of 60 to 65%.  Will resume IV Lasix twice daily at this time since patient is more symptomatic overnight and has not had much urinary output.  Reinforced fluid restriction to 1500 MLS per day and low-salt diet.    Continue metoprolol, amiodarone.   Patient is on 20 mg of Lasix daily at home.  Continue strict intake and output charting.  Patient was back on oxygen today.  Will continue diuretics.  Closely monitor.   Type 2 diabetes mellitus with vascular disease with hyperglycemia. -Controlled.  Hemoglobin A1c at 6.0.  Continue sliding scale insulin, NovoLog 70/30, 15 units twice daily.  Latest POC glucose of 198.  Atrial  fibrillation ([place]) Controlled, continue Eliquis amiodarone metoprolol. Eliquis, amiodarone, metoprolol   Hyponatremia Likely hypervolemic hyponatremia.  Sodium of 124 on presentation.  Has improved to 131.  Will continue to monitor.  Holding Prozac for now.  Urinary sodium was 92.  S/P CABG x 3 No chest pain.  Troponin 15 > 13.  EKG without changes. Statins Eliquis.  Hypokalemia.  Improved after replacement.  Will continue to replenish while on diuretics.  Latest potassium at 3.8.  Mild hypomagnesemia.  Replenished.  Latest magnesium at 1.9.  Continue oral magnesium oxide.   Chronic pancreatitis ([place]) Resume creon no active issues.   Essential hypertension Continue Norvasc, metoprolol.  Blood pressure better today.   DVT prophylaxis:  apixaban (ELIQUIS) tablet 5 mg   Code Status:     Code Status: Full Code  Disposition: Home likely in 1 to 2 days.  PT did not recommend specific needs on discharge.  Status is: Inpatient  Remains inpatient appropriate because: Congestive heart failure, IV Lasix.   Family Communication:  Communicated with the patient's granddaughter at bedside on 03/12/2022.  Consultants:  Cardiology  Procedures:  None  Antimicrobials:  None  Anti-infectives (From admission, onward)    None      Subjective: Today, patient was seen and examined at bedside.  Patient still feels short of breath and could not breathe well overnight.  Has been put back on oxygen.  Does not feel well.  Has not made much urine.    Objective: Vitals:   03/13/22 1952 03/14/22 3500 03/14/22 0537 03/14/22 9381  BP: (!) 130/57  (!) 150/84 (!) 155/66  Pulse: 69  73   Resp: 18  18   Temp: 98.1 F (36.7 C)  98.4 F (36.9 C)   TempSrc: Oral     SpO2: 99%  100%   Weight:  68.4 kg    Height:        Intake/Output Summary (Last 24 hours) at 03/14/2022 1138 Last data filed at 03/14/2022 0901 Gross per 24 hour  Intake 580 ml  Output 700 ml  Net -120 ml    Filed  Weights   03/12/22 0457 03/13/22 0523 03/14/22 0439  Weight: 67.9 kg 68.1 kg 68.4 kg    Physical Examination: Body mass index is 26.71 kg/m.  General:  Average built, not in obvious distress, on supplemental oxygen HENT:   No scleral pallor or icterus noted. Oral mucosa is moist.  Chest:    Decreased breath sounds bilaterally. CVS: S1 &S2 heard.  Abdomen: Soft, nontender, nondistended.  Bowel sounds are heard.   Extremities: No cyanosis, clubbing or lower extremity pitting edema noted.  Peripheral pulses are palpable. Psych: Alert, awake and medicated, CNS:  No cranial nerve deficits.  Power equal in all extremities.   Skin: Warm and dry.  No rashes noted.  Data Reviewed:   CBC: Recent Labs  Lab 03/11/22 1355 03/12/22 1545 03/13/22 1328  WBC 7.6 7.9 8.8  NEUTROABS 5.8  --   --   HGB 11.5* 11.5* 11.8*  HCT 34.8* 34.6* 36.1  MCV 83.5 84.6 85.7  PLT 214 228 253     Basic Metabolic Panel: Recent Labs  Lab 03/11/22 1355 03/12/22 0333 03/13/22 0315 03/14/22 0313  NA 124* 133* 131* 131*  K 3.8 3.4* 3.7 3.8  CL 85* 88* 87* 88*  CO2 27 32 34* 31  GLUCOSE 127* 141* 162* 128*  BUN 23 19 27* 25*  CREATININE 0.91 0.83 1.13* 0.94  CALCIUM 8.8* 9.1 8.6* 8.8*  MG 1.7  --  1.6* 1.9     Liver Function Tests: Recent Labs  Lab 03/11/22 1355  AST 38  ALT 41  ALKPHOS 47  BILITOT 1.1  PROT 7.1  ALBUMIN 3.9      Radiology Studies: No results found.    LOS: 3 days    Flora Lipps, MD [facility] Available via Epic secure chat 7am-7pm After these hours, please refer to coverage provider listed on [email or web address] 03/14/2022, 11:38 AM

## 2022-03-15 DIAGNOSIS — I5033 Acute on chronic diastolic (congestive) heart failure: Secondary | ICD-10-CM | POA: Diagnosis not present

## 2022-03-15 DIAGNOSIS — E1159 Type 2 diabetes mellitus with other circulatory complications: Secondary | ICD-10-CM | POA: Diagnosis not present

## 2022-03-15 DIAGNOSIS — I509 Heart failure, unspecified: Secondary | ICD-10-CM | POA: Diagnosis not present

## 2022-03-15 DIAGNOSIS — I4891 Unspecified atrial fibrillation: Secondary | ICD-10-CM | POA: Diagnosis not present

## 2022-03-15 LAB — BASIC METABOLIC PANEL
Anion gap: 7 (ref 5–15)
BUN: 23 mg/dL (ref 8–23)
CO2: 33 mmol/L — ABNORMAL HIGH (ref 22–32)
Calcium: 8.5 mg/dL — ABNORMAL LOW (ref 8.9–10.3)
Chloride: 89 mmol/L — ABNORMAL LOW (ref 98–111)
Creatinine, Ser: 0.95 mg/dL (ref 0.44–1.00)
GFR, Estimated: 56 mL/min — ABNORMAL LOW (ref >60)
Glucose, Bld: 97 mg/dL (ref 70–99)
Potassium: 3.6 mmol/L (ref 3.5–5.1)
Sodium: 129 mmol/L — ABNORMAL LOW (ref 135–145)

## 2022-03-15 LAB — GLUCOSE, CAPILLARY
Glucose-Capillary: 124 mg/dL — ABNORMAL HIGH (ref 70–99)
Glucose-Capillary: 212 mg/dL — ABNORMAL HIGH (ref 70–99)

## 2022-03-15 MED ORDER — FUROSEMIDE 20 MG PO TABS: 40.0000 mg | ORAL_TABLET | Freq: Every day | ORAL | 0 refills | Status: DC

## 2022-03-15 NOTE — Progress Notes (Signed)
Patient slept throughout the night, no complaints of pain or discomfort at this time. Grandson remains at bedside.

## 2022-03-15 NOTE — Discharge Summary (Signed)
Physician Discharge Summary  Misty Edwards:[id number] DOB: 1926/09/03 DOA: 03/11/2022  PCP: Susy Frizzle, MD  Admit date: 03/11/2022 Discharge date: 03/15/2022  Admitted From: Home  Discharge disposition: Home Health   Recommendations for Outpatient Follow-Up:   Follow up with your primary care provider in one week.  Check CBC, BMP, magnesium in the next visit  Discharge Diagnosis:   Principal Problem:   Acute on chronic congestive heart failure (HCC) Active Problems:   Type 2 diabetes mellitus with vascular disease (HCC)   Essential hypertension   Chronic pancreatitis (HCC)   S/P CABG x 3   Acute on chronic diastolic CHF (congestive heart failure) (HCC)   Atrial fibrillation ([place])   Hypomagnesemia   Discharge Condition: Improved.  Diet recommendation: Low sodium, heart healthy.  Diabetic diet.  Wound care: None.  Code status: Full.   History of Present Illness:   Misty Edwards is a 88 y.o. female with past medical history significant for CABG, atrial fibrillation, diastolic CHF , atrial fibrillation on anticoagulation, hypertension, chronic pancreatitis presented hospital with weight gain, abdominal swelling for 6 days with mild lower extremity edema.  Patient was recently admitted between 12/16 to 12/20- for new onset atrial fibrillation and was started on Eliquis and amiodarone.  Patient reported compliance with anticoagulation and Lasix.  Of note patient was discharged home recently on 2 L of oxygen.  She presented with increasing weight gain and hypoxia with 87 to 89% on room air at home.  In the ED, patient had mild tachypnea.  BNP was elevated at 759.  Chest x-ray showed pulmonary vascular congestion with trace left and right pleural effusion.  Patient received Lasix 40 mg IV x 1 and was admitted hospital for further evaluation and treatment.    Hospital Course:   Following conditions were addressed during hospitalization as listed below,  Acute  on chronic congestive heart failure ([place]) As per the patient, patient had 15 pound weight gain in 1 week prior to presentation..  Chest x-ray with pulm vascular congestion elevated BNP.  Recent 2D echocardiogram from 12/23 showed EF of 60 to 65%.  Patient received IV Lasix twice daily during hospitalization with good urinary output and had's good weight loss with improved peripheral edema.  At this time we will change her Lasix to 40 daily from 20 daily at home.  Spoke with the patient's creatinine son at bedside regarding fluid restriction and low-salt diet Daily weights.  Will resume  metoprolol, amiodarone.  Patient stated that she recently had some oxygen at home and uses as needed.   Type 2 diabetes mellitus with vascular disease with hyperglycemia. -Controlled.  Hemoglobin A1c at 6.0.  Patient will resume NovoLog 70/30, from home on discharge.  Atrial fibrillation ([place]) Controlled, continue Eliquis amiodarone metoprolol on discharge.  Hyponatremia Likely hypervolemic hyponatremia.  Sodium of 124 on presentation.  Has improved to 129.  Asymptomatic.  Patient is on Prozac which could be contributing to it as well.    S/P CABG x 3 No chest pain.  Troponin 15 > 13.  EKG without changes.  Patient will continue statins Eliquis.   Hypokalemia.  Was replenished during hospitalization.  Will continue potassium on discharge.   Mild hypomagnesemia.  Replenished.  Latest magnesium at 1.9.    Chronic pancreatitis (HCC) Resume creon, no active issues.   Essential hypertension Continue Norvasc, metoprolol.  Blood pressure has been stable.   Disposition.  At this time, patient is stable for disposition home with outpatient  PCP follow-up.  I spoke with the patient's grandson at bedside.  Medical Consultants:   Cardiology  Procedures:    None Subjective:   Today, patient was seen and examined at bedside.  Feels better.  Breathing better.  Swelling has gone down.  Wants to go home.  Patient's  grandson at bedside.  Discharge Exam:   Vitals:   03/14/22 2239 03/15/22 0540  BP: (!) 116/57 124/65  Pulse: (!) 57 65  Resp: 20 17  Temp: (!) 97.5 F (36.4 C) 97.8 F (36.6 C)  SpO2: 100% 100%   Vitals:   03/14/22 1456 03/14/22 2024 03/14/22 2239 03/15/22 0540  BP: (!) 128/51  (!) 116/57 124/65  Pulse: 69 65 (!) 57 65  Resp: (!) _0 Temp: 97.9 F (36.6 C)  (!) 97.5 F (36.4 C) 97.8 F (36.6 C)  TempSrc:   Oral Oral  SpO2: 98%  100% 100%  Weight:    67.4 kg  Height:       General: Alert awake, not in obvious distress, elderly female, HENT: pupils equally reacting to light,  No scleral pallor or icterus noted. Oral mucosa is moist.  Chest:  .  Diminished breath sounds bilaterally. No crackles or wheezes.  CVS: S1 &S2 heard. No murmur.  Regular rate and rhythm. Abdomen: Soft, nontender, nondistended.  Bowel sounds are heard.   Extremities: No cyanosis, clubbing with trace edema.  Peripheral pulses are palpable. Psych: Alert, awake and oriented, normal mood CNS:  No cranial nerve deficits.  Power equal in all extremities.   Skin: Warm and dry.  No rashes noted.  The results of significant diagnostics from this hospitalization (including imaging, microbiology, ancillary and laboratory) are listed below for reference.     Diagnostic Studies:   DG Chest Portable 1 View  Result Date: 03/11/2022 CLINICAL DATA:  Shortness of breath EXAM: PORTABLE CHEST 1 VIEW COMPARISON:  03/01/2022 FINDINGS: Stable cardiomegaly status post sternotomy and CABG. Mild diffuse interstitial prominence. Moderate left and small right pleural effusions with patchy left basilar opacity. No pneumothorax. IMPRESSION: Findings suggestive of CHF with pulmonary edema. Moderate left and small right pleural effusions with patchy left basilar opacity, likely atelectasis. Electronically Signed   By: Davina Poke D.O.   On: 03/11/2022 13:41     Labs:   Basic Metabolic Panel: Recent Labs  Lab  03/11/22 1355 03/12/22 0333 03/13/22 0315 03/14/22 0313 03/15/22 0331  NA 124* 133* 131* 131* 129*  K 3.8 3.4* 3.7 3.8 3.6  CL 85* 88* 87* 88* 89*  CO2 27 32 34* 31 33*  GLUCOSE 127* 141* 162* 128* 97  BUN 23 19 27* 25* 23  CREATININE 0.91 0.83 1.13* 0.94 0.95  CALCIUM 8.8* 9.1 8.6* 8.8* 8.5*  MG 1.7  --  1.6* 1.9  --    GFR Estimated Creatinine Clearance: 34.1 mL/min (by C-G formula based on SCr of 0.95 mg/dL). Liver Function Tests: Recent Labs  Lab 03/11/22 1355  AST 38  ALT 41  ALKPHOS 47  BILITOT 1.1  PROT 7.1  ALBUMIN 3.9   No results for input(s): "LIPASE", "AMYLASE" in the last 168 hours. No results for input(s): "AMMONIA" in the last 168 hours. Coagulation profile No results for input(s): "INR", "PROTIME" in the last 168 hours.  CBC: Recent Labs  Lab 03/11/22 1355 03/12/22 1545 03/13/22 1328  WBC 7.6 7.9 8.8  NEUTROABS 5.8  --   --   HGB 11.5* 11.5* 11.8*  HCT 34.8* 34.6* 36.1  MCV 83.5 84.6 85.7  PLT 214 228 253   Cardiac Enzymes: No results for input(s): "CKTOTAL", "CKMB", "CKMBINDEX", "TROPONINI" in the last 168 hours. BNP: Invalid input(s): "POCBNP" CBG: Recent Labs  Lab 03/14/22 0748 03/14/22 1107 03/14/22 1613 03/14/22 2237 03/15/22 0715  GLUCAP 216* 198* 212* 109* 124*   D-Dimer No results for input(s): "DDIMER" in the last 72 hours. Hgb A1c No results for input(s): "HGBA1C" in the last 72 hours. Lipid Profile No results for input(s): "CHOL", "HDL", "LDLCALC", "TRIG", "CHOLHDL", "LDLDIRECT" in the last 72 hours. Thyroid function studies No results for input(s): "TSH", "T4TOTAL", "T3FREE", "THYROIDAB" in the last 72 hours.  Invalid input(s): "FREET3" Anemia work up No results for input(s): "VITAMINB12", "FOLATE", "FERRITIN", "TIBC", "IRON", "RETICCTPCT" in the last 72 hours. Microbiology No results found for this or any previous visit (from the past 240 hour(s)).   Discharge Instructions:   Discharge Instructions      (HEART FAILURE PATIENTS) Call MD:  Anytime you have any of the following symptoms: 1) 3 pound weight gain in 24 hours or 5 pounds in 1 week 2) shortness of breath, with or without a dry hacking cough 3) swelling in the hands, feet or stomach 4) if you have to sleep on extra pillows at night in order to breathe.   Complete by: As directed    Avoid straining   Complete by: As directed    Diet - low sodium heart healthy   Complete by: As directed    Fluid restriction 1547m/day   Discharge instructions   Complete by: As directed    Follow-up with your primary care provider in 1 week.  Check blood work at that time.  Take Lasix 40 mg daily in the morning.  Weight yourself every day.  Seek medical attention for worsening symptoms.  Fluid restriction 1500 MLS per day, low-salt diet.  Do not use  processed/canned food.   Heart Failure patients record your daily weight using the same scale at the same time of day   Complete by: As directed    Increase activity slowly   Complete by: As directed    STOP any activity that causes chest pain, shortness of breath, dizziness, sweating, or exessive weakness   Complete by: As directed       Allergies as of 03/15/2022       Reactions   Calcium-containing Compounds Nausea Only   Morphine And Related Other (See Comments)   Hallucination   Raloxifene Itching   Evista- Face and eyes burning   Vitamin D Analogs Nausea Only        Medication List     TAKE these medications    amiodarone 200 MG tablet Commonly known as: PACERONE Take 1 tablet twice a day for 3 weeks, then 1 tablet daily   amLODipine 5 MG tablet Commonly known as: NORVASC TAKE 1 TABLET BY MOUTH ONCE DAILY . APPOINTMENT REQUIRED FOR FUTURE REFILLS What changed: See the new instructions.   apixaban 5 MG Tabs tablet Commonly known as: ELIQUIS Take 1 tablet (5 mg total) by mouth 2 (two) times daily.   BD Insulin Syringe U/F 31G X 5/16" 0.5 ML Misc Generic drug: Insulin  Syringe-Needle U-100 USE AS DIRECTED TWICE DAILY   Blood Glucose System Pak Kit Please dispense as One Touch Ultra. Use as directed to monitor FSBS 4x daily. Dx: E11.65   calcium carbonate 500 MG chewable tablet Commonly known as: TUMS - dosed in mg elemental calcium Chew 3 tablets by mouth  at bedtime.   Creon 24000-76000 units Cpep Generic drug: Pancrelipase (Lip-Prot-Amyl) TAKE 2 CAPSULES BY MOUTH WITH MEALS AND 1 CAP WITH SNACKS x 2   EQ Allergy Relief (Cetirizine) 10 MG tablet Generic drug: cetirizine Take 1 tablet by mouth once daily   ferrous sulfate 325 (65 FE) MG tablet Take 325 mg by mouth daily with breakfast.   FLUoxetine 20 MG capsule Commonly known as: PROZAC Take 1 capsule (20 mg total) by mouth daily.   furosemide 20 MG tablet Commonly known as: LASIX Take 2 tablets (40 mg total) by mouth daily. What changed: how much to take   gabapentin 100 MG capsule Commonly known as: NEURONTIN TAKE 1 CAPSULE BY MOUTH THREE TIMES DAILY AS NEEDED FOR NERVE PAIN What changed: See the new instructions.   Lancets Misc. Misc Pt has Accu Chek Aviva Plus - Needs just lancets  Checks BS 4-5 times per day. Disp 3 boxes(90 day Supply)/4 refills Dx code. E11.9   LORazepam 1 MG tablet Commonly known as: ATIVAN TAKE 1/2 TO 1 (ONE-HALF TO ONE) TABLET BY MOUTH TWICE DAILY AS NEEDED What changed: See the new instructions.   MAGnesium-Oxide 400 (240 Mg) MG tablet Generic drug: magnesium oxide TAKE 1 TABLET BY MOUTH ONCE DAILY . APPOINTMENT REQUIRED FOR FUTURE REFILLS What changed: See the new instructions.   metoprolol tartrate 50 MG tablet Commonly known as: LOPRESSOR Take 1 tablet (50 mg total) by mouth 2 (two) times daily.   multivitamin capsule Take 1 capsule by mouth every morning.   NovoLIN 70/30 ReliOn (70-30) 100 UNIT/ML injection Generic drug: insulin NPH-regular Human INJECT 25 UNITS SUBCUTANEOUSLY IN THE MORNING AND 15 IN THE EVENING What changed:  how to take  this when to take this additional instructions   OneTouch Delica Lancets 55H Misc CHECK BLOOD SUGAR 4-5 TIMES PER DAY   OneTouch Ultra test strip Generic drug: glucose blood USE 1 STRIP TO CHECK GLUCOSE FIVE TIMES DAILY   pantoprazole 40 MG tablet Commonly known as: PROTONIX Take 1 tablet by mouth once daily   potassium chloride 10 MEQ tablet Commonly known as: KLOR-CON TAKE 1 TABLET BY MOUTH ONCE DAILY AS NEEDED (TAKE WITH FUROSEMIDE) What changed: See the new instructions.   rosuvastatin 5 MG tablet Commonly known as: CRESTOR Take 1 tablet by mouth once daily   triamcinolone cream 0.1 % Commonly known as: KENALOG Apply topically 2 (two) times daily.        Follow-up Information     Arnoldo Lenis, MD Follow up on 03/31/2022.   Specialty: Cardiology Why: at 11:00 AM Contact information: [address] [place] [address] [phone]                  Time coordinating discharge: 39 minutes  Signed:  Berlie Hatchel  [facility] 03/15/2022, 9:30 AM

## 2022-03-15 NOTE — Progress Notes (Signed)
Nsg Discharge Note  Admit Date:  03/11/2022 Discharge date: 03/15/2022   Misty Edwards to be D/C'd Home per MD order.  AVS completed.  Patient/caregiver able to verbalize understanding.  Discharge Medication: Allergies as of 03/15/2022       Reactions   Calcium-containing Compounds Nausea Only   Morphine And Related Other (See Comments)   Hallucination   Raloxifene Itching   Evista- Face and eyes burning   Vitamin D Analogs Nausea Only        Medication List     TAKE these medications    amiodarone 200 MG tablet Commonly known as: PACERONE Take 1 tablet twice a day for 3 weeks, then 1 tablet daily   amLODipine 5 MG tablet Commonly known as: NORVASC TAKE 1 TABLET BY MOUTH ONCE DAILY . APPOINTMENT REQUIRED FOR FUTURE REFILLS What changed: See the new instructions.   apixaban 5 MG Tabs tablet Commonly known as: ELIQUIS Take 1 tablet (5 mg total) by mouth 2 (two) times daily.   BD Insulin Syringe U/F 31G X 5/16" 0.5 ML Misc Generic drug: Insulin Syringe-Needle U-100 USE AS DIRECTED TWICE DAILY   Blood Glucose System Pak Kit Please dispense as One Touch Ultra. Use as directed to monitor FSBS 4x daily. Dx: E11.65   calcium carbonate 500 MG chewable tablet Commonly known as: TUMS - dosed in mg elemental calcium Chew 3 tablets by mouth at bedtime.   Creon 24000-76000 units Cpep Generic drug: Pancrelipase (Lip-Prot-Amyl) TAKE 2 CAPSULES BY MOUTH WITH MEALS AND 1 CAP WITH SNACKS x 2   EQ Allergy Relief (Cetirizine) 10 MG tablet Generic drug: cetirizine Take 1 tablet by mouth once daily   ferrous sulfate 325 (65 FE) MG tablet Take 325 mg by mouth daily with breakfast.   FLUoxetine 20 MG capsule Commonly known as: PROZAC Take 1 capsule (20 mg total) by mouth daily.   furosemide 20 MG tablet Commonly known as: LASIX Take 2 tablets (40 mg total) by mouth daily. What changed: how much to take   gabapentin 100 MG capsule Commonly known as: NEURONTIN TAKE 1  CAPSULE BY MOUTH THREE TIMES DAILY AS NEEDED FOR NERVE PAIN What changed: See the new instructions.   Lancets Misc. Misc Pt has Accu Chek Aviva Plus - Needs just lancets  Checks BS 4-5 times per day. Disp 3 boxes(90 day Supply)/4 refills Dx code. E11.9   LORazepam 1 MG tablet Commonly known as: ATIVAN TAKE 1/2 TO 1 (ONE-HALF TO ONE) TABLET BY MOUTH TWICE DAILY AS NEEDED What changed: See the new instructions.   MAGnesium-Oxide 400 (240 Mg) MG tablet Generic drug: magnesium oxide TAKE 1 TABLET BY MOUTH ONCE DAILY . APPOINTMENT REQUIRED FOR FUTURE REFILLS What changed: See the new instructions.   metoprolol tartrate 50 MG tablet Commonly known as: LOPRESSOR Take 1 tablet (50 mg total) by mouth 2 (two) times daily.   multivitamin capsule Take 1 capsule by mouth every morning.   NovoLIN 70/30 ReliOn (70-30) 100 UNIT/ML injection Generic drug: insulin NPH-regular Human INJECT 25 UNITS SUBCUTANEOUSLY IN THE MORNING AND 15 IN THE EVENING What changed:  how to take this when to take this additional instructions   OneTouch Delica Lancets 33I Misc CHECK BLOOD SUGAR 4-5 TIMES PER DAY   OneTouch Ultra test strip Generic drug: glucose blood USE 1 STRIP TO CHECK GLUCOSE FIVE TIMES DAILY   pantoprazole 40 MG tablet Commonly known as: PROTONIX Take 1 tablet by mouth once daily   potassium chloride 10 MEQ tablet Commonly  known as: KLOR-CON TAKE 1 TABLET BY MOUTH ONCE DAILY AS NEEDED (TAKE WITH FUROSEMIDE) What changed: See the new instructions.   rosuvastatin 5 MG tablet Commonly known as: CRESTOR Take 1 tablet by mouth once daily   triamcinolone cream 0.1 % Commonly known as: KENALOG Apply topically 2 (two) times daily.        Discharge Assessment: Vitals:   03/14/22 2239 03/15/22 0540  BP: (!) 116/57 124/65  Pulse: (!) 57 65  Resp: 20 17  Temp: (!) 97.5 F (36.4 C) 97.8 F (36.6 C)  SpO2: 100% 100%   Skin clean, dry and intact without evidence of skin break down,  no evidence of skin tears noted. IV catheter discontinued intact. Site without signs and symptoms of complications - no redness or edema noted at insertion site, patient denies c/o pain - only slight tenderness at site.  Dressing with slight pressure applied.  D/c Instructions-Education: Discharge instructions given to patient/family with verbalized understanding. D/c education completed with patient/family including follow up instructions, medication list, d/c activities limitations if indicated, with other d/c instructions as indicated by MD - patient able to verbalize understanding, all questions fully answered. Patient instructed to return to ED, call 911, or call MD for any changes in condition.  Patient escorted via [place], and D/C home via private auto.  Kathie Rhodes, RN 03/15/2022 9:35 AM

## 2022-03-19 ENCOUNTER — Telehealth: Payer: Self-pay | Admitting: Cardiology

## 2022-03-19 NOTE — Telephone Encounter (Signed)
Pt c/o medication issue:  1. Name of Medication:   apixaban (ELIQUIS) 5 MG TABS tablet  amiodarone (PACERONE) 200 MG tablet   2. How are you currently taking this medication (dosage and times per day)? As prescribed  3. Are you having a reaction (difficulty breathing--STAT)?  No  4. What is your medication issue?   Daughter stated the patient will need to get assistance with getting this medication as it is very expensive.

## 2022-03-19 NOTE — Telephone Encounter (Signed)
Patients daughter to pick up application for Eliquis at office.

## 2022-03-24 ENCOUNTER — Telehealth: Payer: Self-pay

## 2022-03-24 NOTE — Telephone Encounter (Signed)
Received renewal letter from [facility] assist for patient's Creon. Mailed patient assistance forms to the pt to be filled out and signed.

## 2022-03-25 ENCOUNTER — Telehealth: Payer: Self-pay

## 2022-03-25 NOTE — Telephone Encounter (Signed)
Pt's grandson, Misty Edwards, called stating pt was recently discharged from the hospital with CHF. Lasix was increased to 40 mg per day and to take an extra 20 mg as needed for increased fluid. Misty Edwards states pt's weight has gone from 148 # on 03/17/2022 to 154# today. Misty Edwards states he did give the patient and extra fluid pill on 1/5 and 1/8. Pt has follow up appointment with you on 1/16 and sees Cardiology on 1/15. She does have a lab appointment on 03/26/22. Thank you.

## 2022-03-26 ENCOUNTER — Telehealth: Payer: Self-pay | Admitting: Cardiology

## 2022-03-26 ENCOUNTER — Other Ambulatory Visit: Payer: PPO

## 2022-03-26 ENCOUNTER — Other Ambulatory Visit: Payer: Self-pay

## 2022-03-26 DIAGNOSIS — R12 Heartburn: Secondary | ICD-10-CM

## 2022-03-26 DIAGNOSIS — Z0279 Encounter for issue of other medical certificate: Secondary | ICD-10-CM

## 2022-03-26 MED ORDER — PANTOPRAZOLE SODIUM 40 MG PO TBEC: 40.0000 mg | DELAYED_RELEASE_TABLET | Freq: Every day | ORAL | 3 refills | Status: DC

## 2022-03-26 NOTE — Telephone Encounter (Signed)
Forms from [place] for Misty Edwards received on 03/26/2022. Completed patient authorization attached. Sent to [facility] for completion.  [facility] 03/26/2022

## 2022-03-27 ENCOUNTER — Other Ambulatory Visit: Payer: Self-pay | Admitting: Family Medicine

## 2022-03-27 NOTE — Telephone Encounter (Signed)
Requested Prescriptions  Pending Prescriptions Disp Refills   ONETOUCH ULTRA test strip [Pharmacy Med Name: [place] In Vitro Strip] 300 each 0    Sig: USE 1 STRIP TO [place] DAILY     Endocrinology: Diabetes - Testing Supplies Passed - 03/27/2022  6:50 AM      Passed - Valid encounter within last 12 months    Recent Outpatient Visits           9 months ago Type 2 diabetes mellitus with other specified complication, with long-term current use of insulin ([facility] [place])   [place] Pickard, Cammie Mcgee, MD   1 year ago Type 2 diabetes mellitus with other specified complication, with long-term current use of insulin ([place])   [place] Susy Frizzle, MD   2 years ago Type 2 diabetes mellitus with other specified complication, with long-term current use of insulin ([place])   [place] Pickard, Cammie Mcgee, MD   2 years ago Hospital discharge follow-up   [place], Warren T, MD   2 years ago Acute pain of both [place], Cammie Mcgee, MD       Future Appointments             In 4 days [place], Alphonse Guild, MD [place] at [place]. [place], [place]   In 5 days Pickard, Cammie Mcgee, MD [place], [facility]   In 2 months Branch, Alphonse Guild, MD [place] at [place]. [place]   In 2 months Pickard, Cammie Mcgee, MD [place]

## 2022-03-28 ENCOUNTER — Other Ambulatory Visit: Payer: Self-pay | Admitting: Family Medicine

## 2022-03-28 ENCOUNTER — Other Ambulatory Visit: Payer: PPO

## 2022-03-28 ENCOUNTER — Telehealth: Payer: Self-pay

## 2022-03-28 DIAGNOSIS — I10 Essential (primary) hypertension: Secondary | ICD-10-CM

## 2022-03-28 DIAGNOSIS — I5032 Chronic diastolic (congestive) heart failure: Secondary | ICD-10-CM

## 2022-03-28 DIAGNOSIS — E785 Hyperlipidemia, unspecified: Secondary | ICD-10-CM

## 2022-03-28 DIAGNOSIS — Z794 Long term (current) use of insulin: Secondary | ICD-10-CM

## 2022-03-28 DIAGNOSIS — N1832 Chronic kidney disease, stage 3b: Secondary | ICD-10-CM

## 2022-03-28 MED ORDER — FAMOTIDINE 40 MG PO TABS: 40.0000 mg | ORAL_TABLET | Freq: Every day | ORAL | 3 refills | Status: DC

## 2022-03-28 NOTE — Telephone Encounter (Signed)
Pt grandson, Misty Edwards, states pt has tried Protonix but it does not help her indigestion. Legrand Como asks if a different medication can help.

## 2022-03-28 NOTE — Telephone Encounter (Signed)
Patient's daughter is following up. She would like to make sure that the paperwork is still at the front desk for pick up. She wanted to make sure that another relative hasn't already picked it up. Please confirm whether paperwork is still available.

## 2022-03-29 LAB — BASIC METABOLIC PANEL
BUN/Creatinine Ratio: 29 (calc) — ABNORMAL HIGH (ref 6–22)
BUN: 49 mg/dL — ABNORMAL HIGH (ref 7–25)
CO2: 29 mmol/L (ref 20–32)
Calcium: 9.4 mg/dL (ref 8.6–10.4)
Chloride: 90 mmol/L — ABNORMAL LOW (ref 98–110)
Creat: 1.69 mg/dL — ABNORMAL HIGH (ref 0.60–0.95)
Glucose, Bld: 172 mg/dL — ABNORMAL HIGH (ref 65–99)
Potassium: 4.9 mmol/L (ref 3.5–5.3)
Sodium: 132 mmol/L — ABNORMAL LOW (ref 135–146)

## 2022-03-29 LAB — LIPID PANEL
Cholesterol: 88 mg/dL (ref <200)
HDL: 51 mg/dL (ref >=50)
LDL Cholesterol (Calc): 23 mg/dL (calc)
Non-HDL Cholesterol (Calc): 37 mg/dL (calc) (ref <130)
Total CHOL/HDL Ratio: 1.7 (calc) (ref <5.0)
Triglycerides: 68 mg/dL (ref <150)

## 2022-03-29 LAB — CBC WITH DIFFERENTIAL/PLATELET
Absolute Monocytes: 493 cells/uL (ref 200–950)
Basophils Absolute: 32 cells/uL (ref 0–200)
Basophils Relative: 0.5 %
Eosinophils Absolute: 90 cells/uL (ref 15–500)
Eosinophils Relative: 1.4 %
HCT: 33.8 % — ABNORMAL LOW (ref 35.0–45.0)
Hemoglobin: 10.9 g/dL — ABNORMAL LOW (ref 11.7–15.5)
Lymphs Abs: 947 cells/uL (ref 850–3900)
MCH: 27.9 pg (ref 27.0–33.0)
MCHC: 32.2 g/dL (ref 32.0–36.0)
MCV: 86.7 fL (ref 80.0–100.0)
MPV: 10.3 fL (ref 7.5–12.5)
Monocytes Relative: 7.7 %
Neutro Abs: 4838 cells/uL (ref 1500–7800)
Neutrophils Relative %: 75.6 %
Platelets: 242 10*3/uL (ref 140–400)
RBC: 3.9 10*6/uL (ref 3.80–5.10)
RDW: 14.4 % (ref 11.0–15.0)
Total Lymphocyte: 14.8 %
WBC: 6.4 10*3/uL (ref 3.8–10.8)

## 2022-03-31 ENCOUNTER — Encounter: Payer: Self-pay | Admitting: Cardiology

## 2022-03-31 ENCOUNTER — Ambulatory Visit: Payer: PPO | Attending: Cardiology | Admitting: Cardiology

## 2022-03-31 VITALS — BP 130/60 | HR 60 | Ht 63.0 in | Wt 153.6 lb

## 2022-03-31 DIAGNOSIS — I5032 Chronic diastolic (congestive) heart failure: Secondary | ICD-10-CM

## 2022-03-31 DIAGNOSIS — I4891 Unspecified atrial fibrillation: Secondary | ICD-10-CM | POA: Diagnosis not present

## 2022-03-31 MED ORDER — FUROSEMIDE 20 MG PO TABS: ORAL_TABLET | ORAL | 3 refills | Status: DC

## 2022-03-31 MED ORDER — POTASSIUM CHLORIDE ER 10 MEQ PO TBCR: 10.0000 meq | EXTENDED_RELEASE_TABLET | Freq: Every day | ORAL | 3 refills | Status: DC

## 2022-03-31 NOTE — Telephone Encounter (Signed)
Forms completed on 03/31/2022 patient is aware and will pick them up . They have been faxed to [place]  [facility] 03/31/2022

## 2022-03-31 NOTE — Patient Instructions (Addendum)
Medication Instructions:   Hold Lasix Today and take Lasix 20 mg on Tomorrow evening. Then start Lasix 40 mg in the AM and 20 mg in the PM Daily  Take Potassium 10 meq Daily   *If you need a refill on your cardiac medications before your next appointment, please call your pharmacy*   Lab Work: NONE   If you have labs (blood work) drawn today and your tests are completely normal, you will receive your results only by: [place] (if you have MyChart) OR A paper copy in the mail If you have any lab test that is abnormal or we need to change your treatment, we will call you to review the results.   Testing/Procedures: NONE    Follow-Up: At [facility], you and your health needs are our priority.  As part of our continuing mission to provide you with exceptional heart care, we have created designated Provider Care Teams.  These Care Teams include your primary Cardiologist (physician) and Advanced Practice Providers (APPs -  Physician Assistants and Nurse Practitioners) who all work together to provide you with the care you need, when you need it.  We recommend signing up for the patient portal called "MyChart".  Sign up information is provided on this After Visit Summary.  MyChart is used to connect with patients for Virtual Visits (Telemedicine).  Patients are able to view lab/test results, encounter notes, upcoming appointments, etc.  Non-urgent messages can be sent to your provider as well.   To learn more about what you can do with MyChart, go to [email or web address].    Your next appointment:   6 week(s)  Provider:   You may see Carlyle Dolly, MD or one of the following Advanced Practice Providers on your designated Care Team:   Bernerd Pho, PA-C  Ermalinda Barrios, [place]     Other Instructions Thank you for choosing [facility]!

## 2022-03-31 NOTE — Progress Notes (Signed)
Clinical Summary Misty Edwards is a 89 y.o.female seen today for follow up of the following medical problems. This is a focused visit on her history of chronic HFpEF and recent hospital admissoin.      1. Chronic HFpEF - admit 02/2022 with volume overload.  -02/2022 echo: LVEF 60-65%, no WMAs, indet diastolic fxn, mild RV dysfunction -some LE edema at times - taking lasix '40mg'$  in AM, '40mg'$  in PM. Started that dose 2 weeks ago by pcp - peak 154 lbs, now 149.5. 148.5 lbs at discharge. - repeat labs by pcp Cr spiked to 1.69   2. Afib - she is amio, coreg, eliquis. - may come off amio is remains in afib. Starting amio '200mg'$  once daily.    3. Chronic hypoxia - on home O2 1-2 L as needed.        Other medical issues not addressed this visit 1. CAD - history of emergent CABG June 2016 after presenting with NSTEMI -t echo 06/2015 LVEF 60-65%, grade II diastolic dysfunction - beta blocker dosing limited due to bradycardia.        - no chest pain. No SOB/DOE - compliant with meds   2. HTN - very lenient with her bp's due to chronic orthostatic symptoms and recurrent falls     - remains with compliatnw with meds   3. Hyperlipidemia - 07/2016 TC 106 TG 85 HDL 67 LDL 22     - labs followed by pcp - remains on statin   4. Mitral regurgitation - moderate by echo 06/2015.  - 10/2016 mild MR.    08/2018 echo: LVEF 60-65%, mild MR.  - 09/2019 LVEF 55%, mild MR   5. Falls - 3 falls over the last 3 weeks - last episode 3 weeks ago. Outside turnig off water. Tripped on wood timber.  - another fall after getting up to walk to bathroom. Had some diarrhea. Sitting on commode, stood up. Thinks maybe leg went to sleep, but not sure - another episode got out of bed. Felt dizzy with standing, fell to floor.   - admit 08/2018 with fall and right acetabular fracture.  - no recent falls   Past Medical History:  Diagnosis Date   Acute biliary pancreatitis 07/2002   thia was in  07/2004:she still has pseudocyst in tail of pancreas measuring 18 x 33 mm    Adrenal adenoma    bilateral   Anxiety    CAD (coronary artery disease)    a. s/p CABG in 08/2014 with LIMA-LAD, SVG-OM and SVG-PDA   Chronic pancreatitis ([place])    Detached retina    Diabetes mellitus ([place])    Diverticulosis    Heartburn    History of carcinoma in situ of breast 1988   Hyperlipidemia    Hypertension    IBS (irritable bowel syndrome)    Legally blind    Osteoarthritis    Osteopenia    Upper GI bleed September2004   secondary to gastritis     Allergies  Allergen Reactions   Calcium-Containing Compounds Nausea Only   Morphine And Related Other (See Comments)    Hallucination   Raloxifene Itching    Evista- Face and eyes burning   Vitamin D Analogs Nausea Only     Current Outpatient Medications  Medication Sig Dispense Refill   amiodarone (PACERONE) 200 MG tablet Take 1 tablet twice a day for 3 weeks, then 1 tablet daily 60 tablet 3   amLODipine (NORVASC) 5 MG tablet  TAKE 1 TABLET BY MOUTH ONCE DAILY . APPOINTMENT REQUIRED FOR FUTURE REFILLS (Patient taking differently: Take 5 mg by mouth daily.) 90 tablet 1   apixaban (ELIQUIS) 5 MG TABS tablet Take 1 tablet (5 mg total) by mouth 2 (two) times daily. 60 tablet 3   BD INSULIN SYRINGE U/F 31G X 5/16" 0.5 ML MISC USE AS DIRECTED TWICE DAILY 200 each 0   Blood Glucose Monitoring Suppl (BLOOD GLUCOSE SYSTEM PAK) KIT Please dispense as One Touch Ultra. Use as directed to monitor FSBS 4x daily. Dx: E11.65 1 kit 1   calcium carbonate (TUMS - DOSED IN MG ELEMENTAL CALCIUM) 500 MG chewable tablet Chew 3 tablets by mouth at bedtime.     CREON 24000-76000 units CPEP TAKE 2 CAPSULES BY MOUTH WITH MEALS AND 1 CAP WITH SNACKS x 2 300 capsule 11   EQ ALLERGY RELIEF, CETIRIZINE, 10 MG tablet Take 1 tablet by mouth once daily 90 tablet 0   famotidine (PEPCID) 40 MG tablet Take 1 tablet (40 mg total) by mouth daily. 30 tablet 3   ferrous sulfate 325  (65 FE) MG tablet Take 325 mg by mouth daily with breakfast.     FLUoxetine (PROZAC) 20 MG capsule Take 1 capsule (20 mg total) by mouth daily. 90 capsule 3   furosemide (LASIX) 20 MG tablet Take 2 tablets (40 mg total) by mouth daily. 90 tablet 0   gabapentin (NEURONTIN) 100 MG capsule TAKE 1 CAPSULE BY MOUTH THREE TIMES DAILY AS NEEDED FOR NERVE PAIN (Patient taking differently: Take 100 mg by mouth 3 (three) times daily as needed (nerve pain).) 90 capsule 0   insulin NPH-regular Human (NOVOLIN 70/30 RELION) (70-30) 100 UNIT/ML injection INJECT 25 UNITS SUBCUTANEOUSLY IN THE MORNING AND 15 IN THE EVENING (Patient taking differently: Inject into the skin See admin instructions. INJECT 42 UNITS SUBCUTANEOUSLY IN THE MORNING AND 22 IN THE EVENING) 10 mL 2   Lancets Misc. MISC Pt has Accu Chek Aviva Plus - Needs just lancets  Checks BS 4-5 times per day. Disp 3 boxes(90 day Supply)/4 refills Dx code. E11.9 450 each 3   LORazepam (ATIVAN) 1 MG tablet TAKE 1/2 TO 1 (ONE-HALF TO ONE) TABLET BY MOUTH TWICE DAILY AS NEEDED (Patient taking differently: Take 1 mg by mouth See admin instructions. TAKE 1/2 TO 1 (ONE-HALF TO ONE) TABLET BY MOUTH TWICE DAILY AS NEEDED) 30 tablet 0   MAGNESIUM-OXIDE 400 (240 Mg) MG tablet TAKE 1 TABLET BY MOUTH ONCE DAILY . APPOINTMENT REQUIRED FOR FUTURE REFILLS (Patient taking differently: Take 400 mg by mouth daily.) 30 tablet 6   metoprolol tartrate (LOPRESSOR) 50 MG tablet Take 1 tablet (50 mg total) by mouth 2 (two) times daily. 60 tablet 3   Multiple Vitamin (MULTIVITAMIN) capsule Take 1 capsule by mouth every morning.      OneTouch Delica Lancets 62I MISC CHECK BLOOD SUGAR 4-5 TIMES PER DAY 300 each 0   ONETOUCH ULTRA test strip USE 1 STRIP TO CHECK GLUCOSE FIVE TIMES DAILY 300 each 0   pantoprazole (PROTONIX) 40 MG tablet Take 1 tablet (40 mg total) by mouth daily. 90 tablet 3   potassium chloride (KLOR-CON) 10 MEQ tablet TAKE 1 TABLET BY MOUTH ONCE DAILY AS NEEDED (TAKE  WITH FUROSEMIDE) (Patient taking differently: Take 10 mEq by mouth See admin instructions. TAKE 1 TABLET BY MOUTH ONCE DAILY AS NEEDED (TAKE  WITH  FUROSEMIDE)) 90 tablet 3   rosuvastatin (CRESTOR) 5 MG tablet Take 1 tablet by mouth once  daily 90 tablet 3   triamcinolone cream (KENALOG) 0.1 % Apply topically 2 (two) times daily. 60 g 0   No current facility-administered medications for this visit.     Past Surgical History:  Procedure Laterality Date   CARDIAC CATHETERIZATION N/A 08/24/2014   Procedure: Left Heart Cath and Coronary Angiography;  Surgeon: Leonie Man, MD;  Location: [facility];  Service: Cardiovascular;  Laterality: N/A;   COLONOSCOPY  08/15/05   few tiny diverticula at sigmoid colon/external hemorrhoids but no polyps   COLONOSCOPY  01/08/2012   [id number] diverticulosis. Next colonoscopy in 12/2016   CORONARY ARTERY BYPASS GRAFT N/A 08/24/2014   Procedure: CORONARY ARTERY BYPASS GRAFTING (CABG) x three, using left internal mammary artery and right leg greater saphenous vein harvested endoscopically;  Surgeon: Ivin Poot, MD;  Location: [facility];  Service: Open Heart Surgery;  Laterality: N/A;   ECTOPIC PREGNANCY SURGERY  1950's   ERCP with sphincterotomy  07/2002   ESOPHAGOGASTRODUODENOSCOPY      Gastritis of body.  Otherwise normal   ESOPHAGOGASTRODUODENOSCOPY  01/08/2012   RMR: Few scattered gastric erosions of uncertain significance-status post biopsy. Minimal chronic inflammation, no H.pylori   LAPAROSCOPIC CHOLECYSTECTOMY     MASTECTOMY Right 1980   PANCREATIC PSEUDOCYST DRAINAGE  [id number]   drained percutaneously    right mastectomy     tacking up of her bladder     TEE WITHOUT CARDIOVERSION N/A 08/24/2014   Procedure: TRANSESOPHAGEAL ECHOCARDIOGRAM (TEE);  Surgeon: Ivin Poot, MD;  Location: [facility];  Service: Open Heart Surgery;  Laterality: N/A;     Allergies  Allergen Reactions   Calcium-Containing Compounds Nausea Only   Morphine And Related  Other (See Comments)    Hallucination   Raloxifene Itching    Evista- Face and eyes burning   Vitamin D Analogs Nausea Only      Family History  Problem Relation Age of Onset   Ovarian cancer Mother        passed   Colon cancer Father 87       passed away in his 31's   Colon cancer Brother 20       surgery inhis 93's doing well now   Heart attack Brother        passed away age 25   Pancreatitis Brother    Kidney disease Daughter    Leukemia Son      Social History Misty Edwards reports that she has never smoked. She has never used smokeless tobacco. Misty Edwards reports no history of alcohol use.   Review of Systems CONSTITUTIONAL: No weight loss, fever, chills, weakness or fatigue.  HEENT: Eyes: No visual loss, blurred vision, double vision or yellow sclerae.No hearing loss, sneezing, congestion, runny nose or sore throat.  SKIN: No rash or itching.  CARDIOVASCULAR: per hpi RESPIRATORY: No shortness of breath, cough or sputum.  GASTROINTESTINAL: No anorexia, nausea, vomiting or diarrhea. No abdominal pain or blood.  GENITOURINARY: No burning on urination, no polyuria NEUROLOGICAL: No headache, dizziness, syncope, paralysis, ataxia, numbness or tingling in the extremities. No change in bowel or bladder control.  MUSCULOSKELETAL: No muscle, back pain, joint pain or stiffness.  LYMPHATICS: No enlarged nodes. No history of splenectomy.  PSYCHIATRIC: No history of depression or anxiety.  ENDOCRINOLOGIC: No reports of sweating, cold or heat intolerance. No polyuria or polydipsia.  Marland Kitchen   Physical Examination Today's Vitals   03/31/22 1045  BP: 130/60  Pulse: 60  SpO2: 95%  Weight: 153 lb 9.6  oz (69.7 kg)  Height: '5\' 3"'$  (1.6 m)   Body mass index is 27.21 kg/m.  Gen: resting comfortably, no acute distress HEENT: no scleral icterus, pupils equal round and reactive, no palptable cervical adenopathy,  CV: RRR, no m/r/g no jvd Resp: Clear to auscultation bilaterally GI:  abdomen is soft, non-tender, non-distended, normal bowel sounds, no hepatosplenomegaly MSK: extremities are warm, no edema.  Skin: warm, no rash Neuro:  no focal deficits Psych: appropriate affect      Assessment and Plan   Chronic HFpEF - aki on lasix '40mg'$  bid, hold until tomorrow evening and then start '40mg'$  in AM and '20mg'$  in PM. Has repeat labs 1 week with pcp. Change KCl to 57mq daily -probably dry weight on home scale of 151 would be around ideal  2. Afib - no symptoms - continues on amio, just finished oral load. Rate controlled, if remains in afib on amio may d/c and just rate control at next f/u - continue eliquis for stroke prevention.    JArnoldo Lenis M.D.

## 2022-04-01 ENCOUNTER — Encounter: Payer: Self-pay | Admitting: Family Medicine

## 2022-04-01 ENCOUNTER — Ambulatory Visit (INDEPENDENT_AMBULATORY_CARE_PROVIDER_SITE_OTHER): Payer: PPO | Admitting: Family Medicine

## 2022-04-01 VITALS — BP 126/72 | HR 65 | Temp 97.8°F | Ht 63.0 in | Wt 153.0 lb

## 2022-04-01 DIAGNOSIS — I5032 Chronic diastolic (congestive) heart failure: Secondary | ICD-10-CM | POA: Diagnosis not present

## 2022-04-01 MED ORDER — DEXCOM G7 RECEIVER DEVI: 1.0000 | Freq: Once | 1 refills | Status: DC

## 2022-04-01 MED ORDER — DEXCOM G7 SENSOR MISC: 1.0000 | 11 refills | Status: DC

## 2022-04-01 NOTE — Progress Notes (Signed)
Subjective:    Patient ID: Misty Edwards, female    DOB: 02-23-1927, 89 y.o.   MRN: [id number]  HPI   Admit date: 03/11/2022 Discharge date: 03/15/2022   Admitted From: Home   Discharge disposition: Home Health     Recommendations for Outpatient Follow-Up:    Follow up with your primary care provider in one week.  Check CBC, BMP, magnesium in the next visit   Discharge Diagnosis:    Principal Problem:   Acute on chronic congestive heart failure (HCC) Active Problems:   Type 2 diabetes mellitus with vascular disease (HCC)   Essential hypertension   Chronic pancreatitis (HCC)   S/P CABG x 3   Acute on chronic diastolic CHF (congestive heart failure) (HCC)   Atrial fibrillation ([place])   Hypomagnesemia     Discharge Condition: Improved.   Diet recommendation: Low sodium, heart healthy.  Diabetic diet.   Wound care: None.   Code status: Full.     History of Present Illness:    Misty Edwards is a 89 y.o. female with past medical history significant for CABG, atrial fibrillation, diastolic CHF , atrial fibrillation on anticoagulation, hypertension, chronic pancreatitis presented hospital with weight gain, abdominal swelling for 6 days with mild lower extremity edema.  Patient was recently admitted between 12/16 to 12/20- for new onset atrial fibrillation and was started on Eliquis and amiodarone.  Patient reported compliance with anticoagulation and Lasix.  Of note patient was discharged home recently on 2 L of oxygen.  She presented with increasing weight gain and hypoxia with 87 to 89% on room air at home.  In the ED, patient had mild tachypnea.  BNP was elevated at 759.  Chest x-ray showed pulmonary vascular congestion with trace left and right pleural effusion.  Patient received Lasix 40 mg IV x 1 and was admitted hospital for further evaluation and treatment.     Hospital Course:    Following conditions were addressed during hospitalization as listed below,    Acute on chronic congestive heart failure ([place]) As per the patient, patient had 15 pound weight gain in 1 week prior to presentation..  Chest x-ray with pulm vascular congestion elevated BNP.  Recent 2D echocardiogram from 12/23 showed EF of 60 to 65%.  Patient received IV Lasix twice daily during hospitalization with good urinary output and had's good weight loss with improved peripheral edema.  At this time we will change her Lasix to 40 daily from 20 daily at home.  Spoke with the patient's creatinine son at bedside regarding fluid restriction and low-salt diet Daily weights.  Will resume  metoprolol, amiodarone.  Patient stated that she recently had some oxygen at home and uses as needed.    Type 2 diabetes mellitus with vascular disease with hyperglycemia. -Controlled.  Hemoglobin A1c at 6.0.  Patient will resume NovoLog 70/30, from home on discharge.   Atrial fibrillation ([place]) Controlled, continue Eliquis amiodarone metoprolol on discharge.   Hyponatremia Likely hypervolemic hyponatremia.  Sodium of 124 on presentation.  Has improved to 129.  Asymptomatic.  Patient is on Prozac which could be contributing to it as well.     S/P CABG x 3 No chest pain.  Troponin 15 > 13.  EKG without changes.  Patient will continue statins Eliquis.   Hypokalemia.  Was replenished during hospitalization.  Will continue potassium on discharge.   Mild hypomagnesemia.  Replenished.  Latest magnesium at 1.9.    Chronic pancreatitis (HCC) Resume creon, no active issues.  Essential hypertension Continue Norvasc, metoprolol.  Blood pressure has been stable.   04/01/22 Patient was admitted in early December for atrial fibrillation.  She was then admitted in late December for pulmonary edema and fluid retention.  She was previously on Lasix 40 mg twice daily.  Recently she had lab work that showed her creatinine had increased from 0.9-1.6.  As result her cardiologist decreased her Lasix to 40 mg in the  morning and 20 mg in the evening.  She is here today for follow-up.  She is still in atrial fibrillation today with an irregularly irregular rhythm.  She has +1 pitting edema in both legs up to her mid shin.  However she has bibasilar crackles in both lungs.  She denies any chest pain or shortness of breath.  Her weight today at home was 149 pounds. Past Medical History:  Diagnosis Date   Acute biliary pancreatitis 07/2002   thia was in 07/2004:she still has pseudocyst in tail of pancreas measuring 54 x 33 mm    Adrenal adenoma    bilateral   Anxiety    CAD (coronary artery disease)    a. s/p CABG in 08/2014 with LIMA-LAD, SVG-OM and SVG-PDA   Chronic pancreatitis ([place])    Detached retina    Diabetes mellitus ([place])    Diverticulosis    Heartburn    History of carcinoma in situ of breast 1988   Hyperlipidemia    Hypertension    IBS (irritable bowel syndrome)    Legally blind    Osteoarthritis    Osteopenia    Upper GI bleed September2004   secondary to gastritis   Past Surgical History:  Procedure Laterality Date   CARDIAC CATHETERIZATION N/A 08/24/2014   Procedure: Left Heart Cath and Coronary Angiography;  Surgeon: Leonie Man, MD;  Location: [facility];  Service: Cardiovascular;  Laterality: N/A;   COLONOSCOPY  08/15/05   few tiny diverticula at sigmoid colon/external hemorrhoids but no polyps   COLONOSCOPY  01/08/2012   [id number] diverticulosis. Next colonoscopy in 12/2016   CORONARY ARTERY BYPASS GRAFT N/A 08/24/2014   Procedure: CORONARY ARTERY BYPASS GRAFTING (CABG) x three, using left internal mammary artery and right leg greater saphenous vein harvested endoscopically;  Surgeon: Ivin Poot, MD;  Location: [facility];  Service: Open Heart Surgery;  Laterality: N/A;   ECTOPIC PREGNANCY SURGERY  1950's   ERCP with sphincterotomy  07/2002   ESOPHAGOGASTRODUODENOSCOPY      Gastritis of body.  Otherwise normal   ESOPHAGOGASTRODUODENOSCOPY  01/08/2012   RMR: Few  scattered gastric erosions of uncertain significance-status post biopsy. Minimal chronic inflammation, no H.pylori   LAPAROSCOPIC CHOLECYSTECTOMY     MASTECTOMY Right 1980   PANCREATIC PSEUDOCYST DRAINAGE  [id number]   drained percutaneously    right mastectomy     tacking up of her bladder     TEE WITHOUT CARDIOVERSION N/A 08/24/2014   Procedure: TRANSESOPHAGEAL ECHOCARDIOGRAM (TEE);  Surgeon: Ivin Poot, MD;  Location: [facility];  Service: Open Heart Surgery;  Laterality: N/A;   Current Outpatient Medications on File Prior to Visit  Medication Sig Dispense Refill   amiodarone (PACERONE) 200 MG tablet Take 1 tablet twice a day for 3 weeks, then 1 tablet daily 60 tablet 3   amLODipine (NORVASC) 5 MG tablet TAKE 1 TABLET BY MOUTH ONCE DAILY . APPOINTMENT REQUIRED FOR FUTURE REFILLS (Patient taking differently: Take 5 mg by mouth daily.) 90 tablet 1   apixaban (ELIQUIS) 5 MG TABS tablet Take  1 tablet (5 mg total) by mouth 2 (two) times daily. 60 tablet 3   BD INSULIN SYRINGE U/F 31G X 5/16" 0.5 ML MISC USE AS DIRECTED TWICE DAILY 200 each 0   Blood Glucose Monitoring Suppl (BLOOD GLUCOSE SYSTEM PAK) KIT Please dispense as One Touch Ultra. Use as directed to monitor FSBS 4x daily. Dx: E11.65 1 kit 1   calcium carbonate (TUMS - DOSED IN MG ELEMENTAL CALCIUM) 500 MG chewable tablet Chew 3 tablets by mouth at bedtime.     CREON 24000-76000 units CPEP TAKE 2 CAPSULES BY MOUTH WITH MEALS AND 1 CAP WITH SNACKS x 2 300 capsule 11   EQ ALLERGY RELIEF, CETIRIZINE, 10 MG tablet Take 1 tablet by mouth once daily 90 tablet 0   famotidine (PEPCID) 40 MG tablet Take 1 tablet (40 mg total) by mouth daily. 30 tablet 3   ferrous sulfate 325 (65 FE) MG tablet Take 325 mg by mouth daily with breakfast.     FLUoxetine (PROZAC) 20 MG capsule Take 1 capsule (20 mg total) by mouth daily. 90 capsule 3   furosemide (LASIX) 20 MG tablet Take 40 mg in the AM and Take 20 mg in the PM Daily 270 tablet 3   gabapentin  (NEURONTIN) 100 MG capsule TAKE 1 CAPSULE BY MOUTH THREE TIMES DAILY AS NEEDED FOR NERVE PAIN (Patient taking differently: Take 100 mg by mouth 3 (three) times daily as needed (nerve pain).) 90 capsule 0   insulin NPH-regular Human (NOVOLIN 70/30 RELION) (70-30) 100 UNIT/ML injection INJECT 25 UNITS SUBCUTANEOUSLY IN THE MORNING AND 15 IN THE EVENING (Patient taking differently: Inject into the skin See admin instructions. INJECT 42 UNITS SUBCUTANEOUSLY IN THE MORNING AND 22 IN THE EVENING) 10 mL 2   Lancets Misc. MISC Pt has Accu Chek Aviva Plus - Needs just lancets  Checks BS 4-5 times per day. Disp 3 boxes(90 day Supply)/4 refills Dx code. E11.9 450 each 3   LORazepam (ATIVAN) 1 MG tablet TAKE 1/2 TO 1 (ONE-HALF TO ONE) TABLET BY MOUTH TWICE DAILY AS NEEDED (Patient taking differently: Take 1 mg by mouth See admin instructions. TAKE 1/2 TO 1 (ONE-HALF TO ONE) TABLET BY MOUTH TWICE DAILY AS NEEDED) 30 tablet 0   MAGNESIUM-OXIDE 400 (240 Mg) MG tablet TAKE 1 TABLET BY MOUTH ONCE DAILY . APPOINTMENT REQUIRED FOR FUTURE REFILLS (Patient taking differently: Take 400 mg by mouth daily.) 30 tablet 6   metoprolol tartrate (LOPRESSOR) 50 MG tablet Take 1 tablet (50 mg total) by mouth 2 (two) times daily. 60 tablet 3   Multiple Vitamin (MULTIVITAMIN) capsule Take 1 capsule by mouth every morning.      OneTouch Delica Lancets 64P MISC CHECK BLOOD SUGAR 4-5 TIMES PER DAY 300 each 0   ONETOUCH ULTRA test strip USE 1 STRIP TO CHECK GLUCOSE FIVE TIMES DAILY 300 each 0   pantoprazole (PROTONIX) 40 MG tablet Take 1 tablet (40 mg total) by mouth daily. 90 tablet 3   potassium chloride (KLOR-CON) 10 MEQ tablet Take 1 tablet (10 mEq total) by mouth daily. 90 tablet 3   rosuvastatin (CRESTOR) 5 MG tablet Take 1 tablet by mouth once daily 90 tablet 3   triamcinolone cream (KENALOG) 0.1 % Apply topically 2 (two) times daily. 60 g 0   No current facility-administered medications on file prior to visit.   Allergies   Allergen Reactions   Calcium-Containing Compounds Nausea Only   Morphine And Related Other (See Comments)    Hallucination  Raloxifene Itching    Evista- Face and eyes burning   Vitamin D Analogs Nausea Only   Social History   Socioeconomic History   Marital status: Widowed    Spouse name: Not on file   Number of children: 5   Years of education: Not on file   Highest education level: Not on file  Occupational History   Occupation: homemaker  Tobacco Use   Smoking status: Never   Smokeless tobacco: Never  Vaping Use   Vaping Use: Never used  Substance and Sexual Activity   Alcohol use: No    Alcohol/week: 0.0 standard drinks of alcohol   Drug use: No   Sexual activity: Not Currently    Birth control/protection: Post-menopausal  Other Topics Concern   Not on file  Social History Narrative   Lives in [place].   Social Determinants of Health   Financial Resource Strain: Not on file  Food Insecurity: No Food Insecurity (03/12/2022)   Hunger Vital Sign    Worried About Running Out of Food in the Last Year: Never true    Ran Out of Food in the Last Year: Never true  Transportation Needs: No Transportation Needs (03/12/2022)   PRAPARE - Hydrologist (Medical): No    Lack of Transportation (Non-Medical): No  Physical Activity: Not on file  Stress: Not on file  Social Connections: Not on file  Intimate Partner Violence: Not At Risk (03/12/2022)   Humiliation, Afraid, Rape, and Kick questionnaire    Fear of Current or Ex-Partner: No    Emotionally Abused: No    Physically Abused: No    Sexually Abused: No          Review of Systems  All other systems reviewed and are negative.      Objective:   Physical Exam Constitutional:      General: She is not in acute distress.    Appearance: Normal appearance. She is normal weight. She is not ill-appearing or toxic-appearing.  Cardiovascular:     Rate and Rhythm: Normal rate.  Rhythm irregular.     Heart sounds: Normal heart sounds. No murmur heard.    No friction rub. No gallop.  Pulmonary:     Effort: Pulmonary effort is normal. No respiratory distress.     Breath sounds: No stridor. Rales present. No wheezing or rhonchi.  Chest:     Chest wall: No tenderness.  Abdominal:     General: Abdomen is flat. Bowel sounds are normal. There is no distension.     Palpations: Abdomen is soft. There is no mass.     Tenderness: There is no abdominal tenderness.  Musculoskeletal:     Right lower leg: Edema present.     Left lower leg: Edema present.  Skin:    Findings: No erythema or rash.  Neurological:     General: No focal deficit present.     Mental Status: She is alert and oriented to person, place, and time. Mental status is at baseline.     Cranial Nerves: No cranial nerve deficit.     Sensory: No sensory deficit.     Motor: Weakness present.     Coordination: Coordination normal.           Assessment & Plan:  Chronic diastolic heart failure ([place]) - Plan: CBC with Differential/Platelet, Magnesium, COMPLETE METABOLIC PANEL WITH GFR, Brain natriuretic peptide Patient appears to be fluid overloaded on exam however her most recent lab work suggested dehydration.  Therefore, I would like to repeat her BMP to see where her creatinine is prior to increasing her diuretic.  I believe her baseline creatinine is between 1.1 and 1.3.  Therefore if she is able increase Lasix up to 40 mg twice daily to avoid fluid retention and pulmonary edema.  We will likely need to monitor her creatinine closely.  We also discussed adding Jardiance for diastolic heart failure however she recently had a urinary tract infection and her creatinine and fluid status is still in flux so I would like to see the patient stable on her diuretic prior to adding an additional medication

## 2022-04-02 ENCOUNTER — Encounter: Payer: Self-pay | Admitting: Family Medicine

## 2022-04-02 LAB — CBC WITH DIFFERENTIAL/PLATELET
Absolute Monocytes: 661 cells/uL (ref 200–950)
Basophils Absolute: 44 cells/uL (ref 0–200)
Basophils Relative: 0.5 %
Eosinophils Absolute: 104 cells/uL (ref 15–500)
Eosinophils Relative: 1.2 %
HCT: 48.4 % — ABNORMAL HIGH (ref 35.0–45.0)
Hemoglobin: 15.3 g/dL (ref 11.7–15.5)
Lymphs Abs: 1375 cells/uL (ref 850–3900)
MCH: 27.5 pg (ref 27.0–33.0)
MCHC: 31.6 g/dL — ABNORMAL LOW (ref 32.0–36.0)
MCV: 86.9 fL (ref 80.0–100.0)
MPV: 11.4 fL (ref 7.5–12.5)
Monocytes Relative: 7.6 %
Neutro Abs: 6516 cells/uL (ref 1500–7800)
Neutrophils Relative %: 74.9 %
Platelets: 214 10*3/uL (ref 140–400)
RBC: 5.57 10*6/uL — ABNORMAL HIGH (ref 3.80–5.10)
RDW: 14.6 % (ref 11.0–15.0)
Total Lymphocyte: 15.8 %
WBC: 8.7 10*3/uL (ref 3.8–10.8)

## 2022-04-02 LAB — COMPLETE METABOLIC PANEL WITH GFR
AG Ratio: 1.5 (calc) (ref 1.0–2.5)
ALT: 51 U/L — ABNORMAL HIGH (ref 6–29)
AST: 45 U/L — ABNORMAL HIGH (ref 10–35)
Albumin: 3.9 g/dL (ref 3.6–5.1)
Alkaline phosphatase (APISO): 60 U/L (ref 37–153)
BUN/Creatinine Ratio: 27 (calc) — ABNORMAL HIGH (ref 6–22)
BUN: 40 mg/dL — ABNORMAL HIGH (ref 7–25)
CO2: 34 mmol/L — ABNORMAL HIGH (ref 20–32)
Calcium: 9.1 mg/dL (ref 8.6–10.4)
Chloride: 95 mmol/L — ABNORMAL LOW (ref 98–110)
Creat: 1.48 mg/dL — ABNORMAL HIGH (ref 0.60–0.95)
Globulin: 2.6 g/dL (calc) (ref 1.9–3.7)
Glucose, Bld: 177 mg/dL — ABNORMAL HIGH (ref 65–99)
Potassium: 4.8 mmol/L (ref 3.5–5.3)
Sodium: 136 mmol/L (ref 135–146)
Total Bilirubin: 0.9 mg/dL (ref 0.2–1.2)
Total Protein: 6.5 g/dL (ref 6.1–8.1)
eGFR: 33 mL/min/{1.73_m2} — ABNORMAL LOW (ref >=60)

## 2022-04-02 LAB — MAGNESIUM: Magnesium: 2.4 mg/dL (ref 1.5–2.5)

## 2022-04-02 LAB — BRAIN NATRIURETIC PEPTIDE: Brain Natriuretic Peptide: 453 pg/mL — ABNORMAL HIGH (ref <100)

## 2022-04-05 ENCOUNTER — Other Ambulatory Visit: Payer: Self-pay | Admitting: Family Medicine

## 2022-04-08 ENCOUNTER — Other Ambulatory Visit: Payer: Self-pay | Admitting: Family Medicine

## 2022-04-10 ENCOUNTER — Ambulatory Visit (HOSPITAL_COMMUNITY)
Admission: RE | Admit: 2022-04-10 | Discharge: 2022-04-10 | Disposition: A | Discharge status: Home / Self Care | Payer: PPO | Source: Ambulatory Visit | Attending: Family Medicine | Admitting: Family Medicine

## 2022-04-10 ENCOUNTER — Ambulatory Visit (INDEPENDENT_AMBULATORY_CARE_PROVIDER_SITE_OTHER): Payer: PPO | Admitting: Family Medicine

## 2022-04-10 ENCOUNTER — Other Ambulatory Visit: Payer: Self-pay | Admitting: Family Medicine

## 2022-04-10 VITALS — BP 114/62 | HR 78 | Temp 98.7°F | Ht 63.0 in | Wt 153.0 lb

## 2022-04-10 DIAGNOSIS — I5032 Chronic diastolic (congestive) heart failure: Secondary | ICD-10-CM | POA: Diagnosis not present

## 2022-04-10 DIAGNOSIS — R0989 Other specified symptoms and signs involving the circulatory and respiratory systems: Secondary | ICD-10-CM | POA: Insufficient documentation

## 2022-04-10 DIAGNOSIS — N289 Disorder of kidney and ureter, unspecified: Secondary | ICD-10-CM | POA: Diagnosis not present

## 2022-04-10 MED ORDER — FUROSEMIDE 20 MG PO TABS: ORAL_TABLET | ORAL | 3 refills | Status: DC

## 2022-04-10 NOTE — Progress Notes (Signed)
Subjective:    Patient ID: Misty Edwards, female    DOB: May 09, 1926, 89 y.o.   MRN: [id number]  Back Pain  Rash  Diabetes     Admit date: 03/11/2022 Discharge date: 03/15/2022   Admitted From: Home   Discharge disposition: Home Health     Recommendations for Outpatient Follow-Up:    Follow up with your primary care provider in one week.  Check CBC, BMP, magnesium in the next visit   Discharge Diagnosis:    Principal Problem:   Acute on chronic congestive heart failure (HCC) Active Problems:   Type 2 diabetes mellitus with vascular disease (HCC)   Essential hypertension   Chronic pancreatitis (HCC)   S/P CABG x 3   Acute on chronic diastolic CHF (congestive heart failure) (HCC)   Atrial fibrillation ([place])   Hypomagnesemia     Discharge Condition: Improved.   Diet recommendation: Low sodium, heart healthy.  Diabetic diet.   Wound care: None.   Code status: Full.     History of Present Illness:    Misty Edwards is a 89 y.o. female with past medical history significant for CABG, atrial fibrillation, diastolic CHF , atrial fibrillation on anticoagulation, hypertension, chronic pancreatitis presented hospital with weight gain, abdominal swelling for 6 days with mild lower extremity edema.  Patient was recently admitted between 12/16 to 12/20- for new onset atrial fibrillation and was started on Eliquis and amiodarone.  Patient reported compliance with anticoagulation and Lasix.  Of note patient was discharged home recently on 2 L of oxygen.  She presented with increasing weight gain and hypoxia with 87 to 89% on room air at home.  In the ED, patient had mild tachypnea.  BNP was elevated at 759.  Chest x-ray showed pulmonary vascular congestion with trace left and right pleural effusion.  Patient received Lasix 40 mg IV x 1 and was admitted hospital for further evaluation and treatment.     Hospital Course:    Following conditions were addressed during  hospitalization as listed below,   Acute on chronic congestive heart failure ([place]) As per the patient, patient had 15 pound weight gain in 1 week prior to presentation..  Chest x-ray with pulm vascular congestion elevated BNP.  Recent 2D echocardiogram from 12/23 showed EF of 60 to 65%.  Patient received IV Lasix twice daily during hospitalization with good urinary output and had's good weight loss with improved peripheral edema.  At this time we will change her Lasix to 40 daily from 20 daily at home.  Spoke with the patient's creatinine son at bedside regarding fluid restriction and low-salt diet Daily weights.  Will resume  metoprolol, amiodarone.  Patient stated that she recently had some oxygen at home and uses as needed.    Type 2 diabetes mellitus with vascular disease with hyperglycemia. -Controlled.  Hemoglobin A1c at 6.0.  Patient will resume NovoLog 70/30, from home on discharge.   Atrial fibrillation ([place]) Controlled, continue Eliquis amiodarone metoprolol on discharge.   Hyponatremia Likely hypervolemic hyponatremia.  Sodium of 124 on presentation.  Has improved to 129.  Asymptomatic.  Patient is on Prozac which could be contributing to it as well.     S/P CABG x 3 No chest pain.  Troponin 15 > 13.  EKG without changes.  Patient will continue statins Eliquis.   Hypokalemia.  Was replenished during hospitalization.  Will continue potassium on discharge.   Mild hypomagnesemia.  Replenished.  Latest magnesium at 1.9.    Chronic  pancreatitis ([place]) Resume creon, no active issues.   Essential hypertension Continue Norvasc, metoprolol.  Blood pressure has been stable.   04/01/22 Patient was admitted in early December for atrial fibrillation.  She was then admitted in late December for pulmonary edema and fluid retention.  She was previously on Lasix 40 mg twice daily.  Recently she had lab work that showed her creatinine had increased from 0.9-1.6.  As result her cardiologist  decreased her Lasix to 40 mg in the morning and 20 mg in the evening.  She is here today for follow-up.  She is still in atrial fibrillation today with an irregularly irregular rhythm.  She has +1 pitting edema in both legs up to her mid shin.  However she has bibasilar crackles in both lungs.  She denies any chest pain or shortness of breath.  Her weight today at home was 149 pounds.  At that time, my plan was: Patient appears to be fluid overloaded on exam however her most recent lab work suggested dehydration.  Therefore, I would like to repeat her BMP to see where her creatinine is prior to increasing her diuretic.  I believe her baseline creatinine is between 1.1 and 1.3.  Therefore if she is able increase Lasix up to 40 mg twice daily to avoid fluid retention and pulmonary edema.  We will likely need to monitor her creatinine closely.  We also discussed adding Jardiance for diastolic heart failure however she recently had a urinary tract infection and her creatinine and fluid status is still in flux so I would like to see the patient stable on her diuretic prior to adding an additional medication  04/10/22  Patient's labs at the last visit showed worsening kidney function so we elected to continue Lasix 40 mg in the morning and 20 mg in the evening.  However he has been worse over last few days.  Reports recent shortness of breath and cough.  On exam he has +1 edema in both legs and worsening crackles right greater than left.  On room air she is 88%.  She has gained approximately 2 pounds or 3 pounds on her scale at home  Past Medical History:  Diagnosis Date   Acute biliary pancreatitis 07/2002   thia was in 07/2004:she still has pseudocyst in tail of pancreas measuring 54 x 33 mm    Adrenal adenoma    bilateral   Anxiety    CAD (coronary artery disease)    a. s/p CABG in 08/2014 with LIMA-LAD, SVG-OM and SVG-PDA   Chronic pancreatitis ([place])    Detached retina    Diabetes mellitus ([place])     Diverticulosis    Heartburn    History of carcinoma in situ of breast 1988   Hyperlipidemia    Hypertension    IBS (irritable bowel syndrome)    Legally blind    Osteoarthritis    Osteopenia    Upper GI bleed September2004   secondary to gastritis   Past Surgical History:  Procedure Laterality Date   CARDIAC CATHETERIZATION N/A 08/24/2014   Procedure: Left Heart Cath and Coronary Angiography;  Surgeon: Leonie Man, MD;  Location: [facility];  Service: Cardiovascular;  Laterality: N/A;   COLONOSCOPY  08/15/05   few tiny diverticula at sigmoid colon/external hemorrhoids but no polyps   COLONOSCOPY  01/08/2012   TOI:[id number] diverticulosis. Next colonoscopy in 12/2016   CORONARY ARTERY BYPASS GRAFT N/A 08/24/2014   Procedure: CORONARY ARTERY BYPASS GRAFTING (CABG) x three, using  left internal mammary artery and right leg greater saphenous vein harvested endoscopically;  Surgeon: Ivin Poot, MD;  Location: [facility];  Service: Open Heart Surgery;  Laterality: N/A;   ECTOPIC PREGNANCY SURGERY  1950's   ERCP with sphincterotomy  07/2002   ESOPHAGOGASTRODUODENOSCOPY      Gastritis of body.  Otherwise normal   ESOPHAGOGASTRODUODENOSCOPY  01/08/2012   RMR: Few scattered gastric erosions of uncertain significance-status post biopsy. Minimal chronic inflammation, no H.pylori   LAPAROSCOPIC CHOLECYSTECTOMY     MASTECTOMY Right 1980   PANCREATIC PSEUDOCYST DRAINAGE  [id number]   drained percutaneously    right mastectomy     tacking up of her bladder     TEE WITHOUT CARDIOVERSION N/A 08/24/2014   Procedure: TRANSESOPHAGEAL ECHOCARDIOGRAM (TEE);  Surgeon: Ivin Poot, MD;  Location: [facility];  Service: Open Heart Surgery;  Laterality: N/A;   Current Outpatient Medications on File Prior to Visit  Medication Sig Dispense Refill   amiodarone (PACERONE) 200 MG tablet Take 1 tablet twice a day for 3 weeks, then 1 tablet daily 60 tablet 3   amLODipine (NORVASC) 5 MG tablet TAKE 1 TABLET BY  MOUTH ONCE DAILY . APPOINTMENT REQUIRED FOR FUTURE REFILLS (Patient taking differently: Take 5 mg by mouth daily.) 90 tablet 1   apixaban (ELIQUIS) 5 MG TABS tablet Take 1 tablet (5 mg total) by mouth 2 (two) times daily. 60 tablet 3   BD INSULIN SYRINGE U/F 31G X 5/16" 0.5 ML MISC USE AS DIRECTED TWICE DAILY 200 each 0   Blood Glucose Monitoring Suppl (BLOOD GLUCOSE SYSTEM PAK) KIT Please dispense as One Touch Ultra. Use as directed to monitor FSBS 4x daily. Dx: E11.65 1 kit 1   calcium carbonate (TUMS - DOSED IN MG ELEMENTAL CALCIUM) 500 MG chewable tablet Chew 3 tablets by mouth at bedtime.     Continuous Blood Gluc Sensor (DEXCOM G7 SENSOR) MISC 1 Device by Does not apply route every 14 (fourteen) days. 3 each 11   CREON 24000-76000 units CPEP TAKE 2 CAPSULES BY MOUTH WITH MEALS AND 1 CAP WITH SNACKS x 2 300 capsule 11   EQ ALLERGY RELIEF, CETIRIZINE, 10 MG tablet Take 1 tablet by mouth once daily 90 tablet 0   famotidine (PEPCID) 40 MG tablet Take 1 tablet (40 mg total) by mouth daily. 30 tablet 3   ferrous sulfate 325 (65 FE) MG tablet Take 325 mg by mouth daily with breakfast.     FLUoxetine (PROZAC) 20 MG capsule Take 1 capsule (20 mg total) by mouth daily. 90 capsule 3   furosemide (LASIX) 20 MG tablet Take 40 mg in the AM and Take 20 mg in the PM Daily 270 tablet 3   gabapentin (NEURONTIN) 100 MG capsule TAKE 1 CAPSULE BY MOUTH THREE TIMES DAILY AS NEEDED FOR NERVE PAIN (Patient taking differently: Take 100 mg by mouth 3 (three) times daily as needed (nerve pain).) 90 capsule 0   insulin NPH-regular Human (NOVOLIN 70/30 RELION) (70-30) 100 UNIT/ML injection INJECT 25 UNITS SUBCUTANEOUSLY IN THE MORNING AND 15 IN THE EVENING (Patient taking differently: Inject into the skin See admin instructions. INJECT 42 UNITS SUBCUTANEOUSLY IN THE MORNING AND 22 IN THE EVENING) 10 mL 2   Lancets Misc. MISC Pt has Accu Chek Aviva Plus - Needs just lancets  Checks BS 4-5 times per day. Disp 3 boxes(90 day  Supply)/4 refills Dx code. E11.9 450 each 3   LORazepam (ATIVAN) 1 MG tablet TAKE 1/2 TO 1 (ONE-HALF TO  ONE) TABLET BY MOUTH TWICE DAILY AS NEEDED 30 tablet 0   MAGNESIUM-OXIDE 400 (240 Mg) MG tablet TAKE 1 TABLET BY MOUTH ONCE DAILY . APPOINTMENT REQUIRED FOR FUTURE REFILLS (Patient taking differently: Take 400 mg by mouth daily.) 30 tablet 6   metoprolol tartrate (LOPRESSOR) 50 MG tablet Take 1 tablet (50 mg total) by mouth 2 (two) times daily. 60 tablet 3   Multiple Vitamin (MULTIVITAMIN) capsule Take 1 capsule by mouth every morning.      OneTouch Delica Lancets 27N MISC CHECK BLOOD SUGAR 4-5 TIMES PER DAY 300 each 0   ONETOUCH ULTRA test strip USE 1 STRIP TO CHECK GLUCOSE FIVE TIMES DAILY 300 each 0   pantoprazole (PROTONIX) 40 MG tablet Take 1 tablet (40 mg total) by mouth daily. 90 tablet 3   potassium chloride (KLOR-CON) 10 MEQ tablet Take 1 tablet (10 mEq total) by mouth daily. 90 tablet 3   rosuvastatin (CRESTOR) 5 MG tablet Take 1 tablet by mouth once daily 90 tablet 3   triamcinolone cream (KENALOG) 0.1 % Apply topically 2 (two) times daily. 60 g 0   No current facility-administered medications on file prior to visit.   Allergies  Allergen Reactions   Calcium-Containing Compounds Nausea Only   Morphine And Related Other (See Comments)    Hallucination   Raloxifene Itching    Evista- Face and eyes burning   Vitamin D Analogs Nausea Only   Social History   Socioeconomic History   Marital status: Widowed    Spouse name: Not on file   Number of children: 5   Years of education: Not on file   Highest education level: Not on file  Occupational History   Occupation: homemaker  Tobacco Use   Smoking status: Never   Smokeless tobacco: Never  Vaping Use   Vaping Use: Never used  Substance and Sexual Activity   Alcohol use: No    Alcohol/week: 0.0 standard drinks of alcohol   Drug use: No   Sexual activity: Not Currently    Birth control/protection: Post-menopausal  Other  Topics Concern   Not on file  Social History Narrative   Lives in [place].   Social Determinants of Health   Financial Resource Strain: Not on file  Food Insecurity: No Food Insecurity (03/12/2022)   Hunger Vital Sign    Worried About Running Out of Food in the Last Year: Never true    Ran Out of Food in the Last Year: Never true  Transportation Needs: No Transportation Needs (03/12/2022)   PRAPARE - Hydrologist (Medical): No    Lack of Transportation (Non-Medical): No  Physical Activity: Not on file  Stress: Not on file  Social Connections: Not on file  Intimate Partner Violence: Not At Risk (03/12/2022)   Humiliation, Afraid, Rape, and Kick questionnaire    Fear of Current or Ex-Partner: No    Emotionally Abused: No    Physically Abused: No    Sexually Abused: No          Review of Systems  Musculoskeletal:  Positive for back pain.  Skin:  Positive for rash.  All other systems reviewed and are negative.      Objective:   Physical Exam Constitutional:      General: She is not in acute distress.    Appearance: Normal appearance. She is normal weight. She is not ill-appearing or toxic-appearing.  Cardiovascular:     Rate and Rhythm: Normal rate. Rhythm irregular.  Heart sounds: Normal heart sounds. No murmur heard.    No friction rub. No gallop.  Pulmonary:     Effort: Pulmonary effort is normal. No respiratory distress.     Breath sounds: No stridor. Rales present. No wheezing or rhonchi.  Chest:     Chest wall: No tenderness.  Abdominal:     General: Abdomen is flat. Bowel sounds are normal. There is no distension.     Palpations: Abdomen is soft. There is no mass.     Tenderness: There is no abdominal tenderness.  Musculoskeletal:     Right lower leg: Edema present.     Left lower leg: Edema present.  Skin:    Findings: No erythema or rash.  Neurological:     General: No focal deficit present.     Mental Status: She  is alert and oriented to person, place, and time. Mental status is at baseline.     Cranial Nerves: No cranial nerve deficit.     Sensory: No sensory deficit.     Motor: Weakness present.     Coordination: Coordination normal.           Assessment & Plan:  Respiratory crackles at right lung base - Plan: DG Chest 2 View, BASIC METABOLIC PANEL WITH GFR, Brain natriuretic peptide  Chronic diastolic heart failure (HCC)  Renal function impairment Very complicated patient with worsening kidney function but appears to be fluid overloaded with pulmonary edema.  Increase Lasix to 60 mg in the morning and 40 mg in the afternoon.  Obtain baseline renal function today with creatinine and potassium level.  Send the patient to the hospital to get a chest x-ray to rule out right-sided pneumonia given the asymmetry in the pulmonary crackles.  If x-ray confirms pulmonary edema and shows no evidence of infiltrate, I plan to recheck her renal function and electrolytes next week.  If her shortness of breath is worsening she needs to go to the hospital.  We will address her renal function next week but at the present time she is decompensating and appears to be fluid overloaded and requires diuresis.

## 2022-04-10 NOTE — Telephone Encounter (Signed)
Prescription Request  04/10/2022  Is this a "Controlled Substance" medicine? No  LOV: 04/10/2022  What is the name of the medication or equipment?   furosemide (LASIX) 20 MG tablet   Have you contacted your pharmacy to request a refill? Yes   Which pharmacy would you like this sent to?   [place], [place] - [address] [place], [place] [address] Phone: [phone]  Fax: [phone]   Patient notified that their request is being sent to the clinical staff for review and that they should receive a response within 2 business days.   Please advise pharmacist. .

## 2022-04-11 LAB — BASIC METABOLIC PANEL WITH GFR
BUN/Creatinine Ratio: 18 (calc) (ref 6–22)
BUN: 28 mg/dL — ABNORMAL HIGH (ref 7–25)
CO2: 35 mmol/L — ABNORMAL HIGH (ref 20–32)
Calcium: 9.1 mg/dL (ref 8.6–10.4)
Chloride: 98 mmol/L (ref 98–110)
Creat: 1.53 mg/dL — ABNORMAL HIGH (ref 0.60–0.95)
Glucose, Bld: 92 mg/dL (ref 65–99)
Potassium: 4.4 mmol/L (ref 3.5–5.3)
Sodium: 139 mmol/L (ref 135–146)
eGFR: 32 mL/min/{1.73_m2} — ABNORMAL LOW (ref >=60)

## 2022-04-11 LAB — BRAIN NATRIURETIC PEPTIDE: Brain Natriuretic Peptide: 493 pg/mL — ABNORMAL HIGH (ref <100)

## 2022-04-12 ENCOUNTER — Other Ambulatory Visit: Payer: Self-pay | Admitting: Family

## 2022-04-12 MED ORDER — FUROSEMIDE 20 MG PO TABS: ORAL_TABLET | ORAL | 3 refills | Status: DC

## 2022-04-12 NOTE — Progress Notes (Signed)
Family called stating lasix was not called into pharmacy. Prescription sent to pharmacy

## 2022-04-13 ENCOUNTER — Telehealth: Payer: Self-pay | Admitting: Physician Assistant

## 2022-04-13 NOTE — Telephone Encounter (Signed)
Patient's grandson called because her blood pressure is running lower than normal and her weight is up 3 pounds.  I could hear Misty Edwards in the background, she sounded awake and alert, and was responding to questions.  She does not want to go to the hospital.  Her systolic blood pressure has been approximately 100 for the last day or so but she does not feel bad and says she is not having any pain or difficulties.  She is not lightheaded or dizzy.  Recommended that she stop the amlodipine for now.  At this time, I do not wish to give her extra Lasix because her blood pressure is borderline but I explained that if her blood pressure came up during the day, she could get an extra Lasix tablet with her evening dose changing it from 40 mg to 60 mg in the evening.  He will keep an eye on her and let us know if her volume status or blood pressure do not improve.  If she starts feeling bad, he was reminded that he can always call 911 and take her to the ER.  Misty Ferries, PA-C 04/13/2022 10:32 AM

## 2022-04-14 ENCOUNTER — Telehealth: Payer: Self-pay

## 2022-04-14 ENCOUNTER — Other Ambulatory Visit: Payer: Self-pay

## 2022-04-14 ENCOUNTER — Other Ambulatory Visit: Payer: Self-pay | Admitting: Family Medicine

## 2022-04-14 DIAGNOSIS — R0989 Other specified symptoms and signs involving the circulatory and respiratory systems: Secondary | ICD-10-CM

## 2022-04-14 DIAGNOSIS — I5032 Chronic diastolic (congestive) heart failure: Secondary | ICD-10-CM

## 2022-04-14 MED ORDER — BENZONATATE 200 MG PO CAPS: 200.0000 mg | ORAL_CAPSULE | Freq: Three times a day (TID) | ORAL | 0 refills | Status: DC | PRN

## 2022-04-14 MED ORDER — FUROSEMIDE 20 MG PO TABS: ORAL_TABLET | ORAL | 3 refills | Status: DC

## 2022-04-14 MED ORDER — METOLAZONE 5 MG PO TABS: 5.0000 mg | ORAL_TABLET | Freq: Once | ORAL | 0 refills | Status: DC

## 2022-04-14 NOTE — Telephone Encounter (Signed)
[name] called in wanting to discuss pt's recent x-ray results. [name] was informed that pt does have an appt with pcp tomorrow (04/15/22). [name] stated that she would like to know the results today. Please advise.  CB#: [phone]

## 2022-04-15 ENCOUNTER — Inpatient Hospital Stay (HOSPITAL_COMMUNITY)
Admission: EM | Admit: 2022-04-15 | Discharge: 2022-04-18 | DRG: 291 | Disposition: A | Discharge status: Home Health | Payer: PPO | Attending: Family Medicine | Admitting: Family Medicine

## 2022-04-15 ENCOUNTER — Other Ambulatory Visit: Payer: Self-pay

## 2022-04-15 ENCOUNTER — Encounter: Payer: Self-pay | Admitting: Family Medicine

## 2022-04-15 ENCOUNTER — Ambulatory Visit (INDEPENDENT_AMBULATORY_CARE_PROVIDER_SITE_OTHER): Payer: PPO | Admitting: Family Medicine

## 2022-04-15 ENCOUNTER — Emergency Department (HOSPITAL_COMMUNITY): Payer: PPO

## 2022-04-15 ENCOUNTER — Encounter (HOSPITAL_COMMUNITY): Payer: Self-pay | Admitting: Emergency Medicine

## 2022-04-15 VITALS — BP 110/54 | HR 76 | Temp 98.5°F | Ht 63.0 in | Wt 150.3 lb

## 2022-04-15 DIAGNOSIS — I340 Nonrheumatic mitral (valve) insufficiency: Secondary | ICD-10-CM | POA: Diagnosis present

## 2022-04-15 DIAGNOSIS — I4811 Longstanding persistent atrial fibrillation: Secondary | ICD-10-CM | POA: Diagnosis present

## 2022-04-15 DIAGNOSIS — J810 Acute pulmonary edema: Secondary | ICD-10-CM | POA: Diagnosis not present

## 2022-04-15 DIAGNOSIS — Z9981 Dependence on supplemental oxygen: Secondary | ICD-10-CM | POA: Diagnosis not present

## 2022-04-15 DIAGNOSIS — Z951 Presence of aortocoronary bypass graft: Secondary | ICD-10-CM

## 2022-04-15 DIAGNOSIS — Y92239 Unspecified place in hospital as the place of occurrence of the external cause: Secondary | ICD-10-CM | POA: Diagnosis not present

## 2022-04-15 DIAGNOSIS — N1831 Chronic kidney disease, stage 3a: Secondary | ICD-10-CM | POA: Diagnosis present

## 2022-04-15 DIAGNOSIS — T380X5A Adverse effect of glucocorticoids and synthetic analogues, initial encounter: Secondary | ICD-10-CM | POA: Diagnosis not present

## 2022-04-15 DIAGNOSIS — H548 Legal blindness, as defined in USA: Secondary | ICD-10-CM | POA: Diagnosis present

## 2022-04-15 DIAGNOSIS — J9621 Acute and chronic respiratory failure with hypoxia: Secondary | ICD-10-CM | POA: Diagnosis not present

## 2022-04-15 DIAGNOSIS — E1122 Type 2 diabetes mellitus with diabetic chronic kidney disease: Secondary | ICD-10-CM | POA: Diagnosis present

## 2022-04-15 DIAGNOSIS — N289 Disorder of kidney and ureter, unspecified: Secondary | ICD-10-CM

## 2022-04-15 DIAGNOSIS — U071 COVID-19: Secondary | ICD-10-CM | POA: Diagnosis not present

## 2022-04-15 DIAGNOSIS — I2510 Atherosclerotic heart disease of native coronary artery without angina pectoris: Secondary | ICD-10-CM | POA: Diagnosis present

## 2022-04-15 DIAGNOSIS — I509 Heart failure, unspecified: Principal | ICD-10-CM

## 2022-04-15 DIAGNOSIS — E1165 Type 2 diabetes mellitus with hyperglycemia: Secondary | ICD-10-CM | POA: Diagnosis not present

## 2022-04-15 DIAGNOSIS — Z79899 Other long term (current) drug therapy: Secondary | ICD-10-CM

## 2022-04-15 DIAGNOSIS — Z8249 Family history of ischemic heart disease and other diseases of the circulatory system: Secondary | ICD-10-CM

## 2022-04-15 DIAGNOSIS — J9611 Chronic respiratory failure with hypoxia: Secondary | ICD-10-CM | POA: Diagnosis present

## 2022-04-15 DIAGNOSIS — E1159 Type 2 diabetes mellitus with other circulatory complications: Secondary | ICD-10-CM | POA: Diagnosis present

## 2022-04-15 DIAGNOSIS — I130 Hypertensive heart and chronic kidney disease with heart failure and stage 1 through stage 4 chronic kidney disease, or unspecified chronic kidney disease: Secondary | ICD-10-CM | POA: Diagnosis present

## 2022-04-15 DIAGNOSIS — I2489 Other forms of acute ischemic heart disease: Secondary | ICD-10-CM | POA: Diagnosis present

## 2022-04-15 DIAGNOSIS — I4819 Other persistent atrial fibrillation: Secondary | ICD-10-CM | POA: Diagnosis present

## 2022-04-15 DIAGNOSIS — Z86000 Personal history of in-situ neoplasm of breast: Secondary | ICD-10-CM

## 2022-04-15 DIAGNOSIS — Z794 Long term (current) use of insulin: Secondary | ICD-10-CM

## 2022-04-15 DIAGNOSIS — F419 Anxiety disorder, unspecified: Secondary | ICD-10-CM | POA: Diagnosis present

## 2022-04-15 DIAGNOSIS — E876 Hypokalemia: Secondary | ICD-10-CM | POA: Diagnosis present

## 2022-04-15 DIAGNOSIS — I5033 Acute on chronic diastolic (congestive) heart failure: Secondary | ICD-10-CM | POA: Diagnosis not present

## 2022-04-15 DIAGNOSIS — I4891 Unspecified atrial fibrillation: Secondary | ICD-10-CM | POA: Diagnosis not present

## 2022-04-15 DIAGNOSIS — I5032 Chronic diastolic (congestive) heart failure: Secondary | ICD-10-CM

## 2022-04-15 DIAGNOSIS — Z9049 Acquired absence of other specified parts of digestive tract: Secondary | ICD-10-CM

## 2022-04-15 DIAGNOSIS — Z885 Allergy status to narcotic agent status: Secondary | ICD-10-CM

## 2022-04-15 DIAGNOSIS — R0989 Other specified symptoms and signs involving the circulatory and respiratory systems: Secondary | ICD-10-CM

## 2022-04-15 DIAGNOSIS — E785 Hyperlipidemia, unspecified: Secondary | ICD-10-CM | POA: Diagnosis present

## 2022-04-15 DIAGNOSIS — Z8041 Family history of malignant neoplasm of ovary: Secondary | ICD-10-CM

## 2022-04-15 DIAGNOSIS — E7849 Other hyperlipidemia: Secondary | ICD-10-CM | POA: Diagnosis not present

## 2022-04-15 DIAGNOSIS — Z888 Allergy status to other drugs, medicaments and biological substances status: Secondary | ICD-10-CM

## 2022-04-15 DIAGNOSIS — K219 Gastro-esophageal reflux disease without esophagitis: Secondary | ICD-10-CM | POA: Diagnosis present

## 2022-04-15 DIAGNOSIS — Z7901 Long term (current) use of anticoagulants: Secondary | ICD-10-CM

## 2022-04-15 DIAGNOSIS — Z800 Family history of malignant neoplasm of digestive organs: Secondary | ICD-10-CM

## 2022-04-15 DIAGNOSIS — Z9011 Acquired absence of right breast and nipple: Secondary | ICD-10-CM

## 2022-04-15 DIAGNOSIS — R296 Repeated falls: Secondary | ICD-10-CM | POA: Diagnosis present

## 2022-04-15 DIAGNOSIS — I25810 Atherosclerosis of coronary artery bypass graft(s) without angina pectoris: Secondary | ICD-10-CM | POA: Diagnosis not present

## 2022-04-15 DIAGNOSIS — Z806 Family history of leukemia: Secondary | ICD-10-CM

## 2022-04-15 LAB — CBC WITH DIFFERENTIAL/PLATELET
Abs Immature Granulocytes: 0.01 10*3/uL (ref 0.00–0.07)
Basophils Absolute: 0 10*3/uL (ref 0.0–0.1)
Basophils Relative: 0 %
Eosinophils Absolute: 0 10*3/uL (ref 0.0–0.5)
Eosinophils Relative: 0 %
HCT: 35 % — ABNORMAL LOW (ref 36.0–46.0)
Hemoglobin: 10.7 g/dL — ABNORMAL LOW (ref 12.0–15.0)
Immature Granulocytes: 0 %
Lymphocytes Relative: 27 %
Lymphs Abs: 1.4 10*3/uL (ref 0.7–4.0)
MCH: 26.6 pg (ref 26.0–34.0)
MCHC: 30.6 g/dL (ref 30.0–36.0)
MCV: 86.8 fL (ref 80.0–100.0)
Monocytes Absolute: 0.5 10*3/uL (ref 0.1–1.0)
Monocytes Relative: 9 %
Neutro Abs: 3.4 10*3/uL (ref 1.7–7.7)
Neutrophils Relative %: 64 %
Platelets: 121 10*3/uL — ABNORMAL LOW (ref 150–400)
RBC: 4.03 MIL/uL (ref 3.87–5.11)
RDW: 15.9 % — ABNORMAL HIGH (ref 11.5–15.5)
WBC: 5.3 10*3/uL (ref 4.0–10.5)
nRBC: 0 % (ref 0.0–0.2)

## 2022-04-15 LAB — BASIC METABOLIC PANEL
Anion gap: 11 (ref 5–15)
BUN: 35 mg/dL — ABNORMAL HIGH (ref 8–23)
CO2: 31 mmol/L (ref 22–32)
Calcium: 8.5 mg/dL — ABNORMAL LOW (ref 8.9–10.3)
Chloride: 95 mmol/L — ABNORMAL LOW (ref 98–111)
Creatinine, Ser: 1.49 mg/dL — ABNORMAL HIGH (ref 0.44–1.00)
GFR, Estimated: 33 mL/min — ABNORMAL LOW (ref >60)
Glucose, Bld: 113 mg/dL — ABNORMAL HIGH (ref 70–99)
Potassium: 3.3 mmol/L — ABNORMAL LOW (ref 3.5–5.1)
Sodium: 137 mmol/L (ref 135–145)

## 2022-04-15 LAB — RESP PANEL BY RT-PCR (RSV, FLU A&B, COVID)  RVPGX2
Influenza A by PCR: NEGATIVE
Influenza B by PCR: NEGATIVE
Resp Syncytial Virus by PCR: NEGATIVE
SARS Coronavirus 2 by RT PCR: POSITIVE — AB

## 2022-04-15 LAB — POC OCCULT BLOOD, ED: Fecal Occult Bld: NEGATIVE

## 2022-04-15 LAB — BRAIN NATRIURETIC PEPTIDE: B Natriuretic Peptide: 882 pg/mL — ABNORMAL HIGH (ref 0.0–100.0)

## 2022-04-15 LAB — TROPONIN I (HIGH SENSITIVITY): Troponin I (High Sensitivity): 23 ng/L — ABNORMAL HIGH (ref <18)

## 2022-04-15 LAB — GLUCOSE, CAPILLARY
Glucose-Capillary: 138 mg/dL — ABNORMAL HIGH (ref 70–99)
Glucose-Capillary: 256 mg/dL — ABNORMAL HIGH (ref 70–99)

## 2022-04-15 LAB — CBG MONITORING, ED: Glucose-Capillary: 106 mg/dL — ABNORMAL HIGH (ref 70–99)

## 2022-04-15 MED ORDER — INSULIN ASPART 100 UNIT/ML IJ SOLN
0.0000 [IU] | Freq: Three times a day (TID) | INTRAMUSCULAR | Status: DC
  Administered 2022-04-16 (×2): 3 [IU] via SUBCUTANEOUS
  Administered 2022-04-16: 5 [IU] via SUBCUTANEOUS
  Administered 2022-04-17 (×2): 3 [IU] via SUBCUTANEOUS
  Administered 2022-04-17: 9 [IU] via SUBCUTANEOUS
  Administered 2022-04-18: 1 [IU] via SUBCUTANEOUS

## 2022-04-15 MED ORDER — ONDANSETRON HCL 4 MG/2ML IJ SOLN
4.0000 mg | Freq: Four times a day (QID) | INTRAMUSCULAR | Status: DC | PRN
  Administered 2022-04-17 – 2022-04-18 (×2): 4 mg via INTRAVENOUS
  Filled 2022-04-15 (×2): qty 2

## 2022-04-15 MED ORDER — ONDANSETRON HCL 4 MG PO TABS
4.0000 mg | ORAL_TABLET | Freq: Four times a day (QID) | ORAL | Status: DC | PRN
  Administered 2022-04-16: 4 mg via ORAL
  Filled 2022-04-15: qty 1

## 2022-04-15 MED ORDER — APIXABAN 5 MG PO TABS
5.0000 mg | ORAL_TABLET | Freq: Two times a day (BID) | ORAL | Status: DC
  Administered 2022-04-15 – 2022-04-16 (×2): 5 mg via ORAL
  Filled 2022-04-15 (×2): qty 1

## 2022-04-15 MED ORDER — POTASSIUM CHLORIDE CRYS ER 20 MEQ PO TBCR
40.0000 meq | EXTENDED_RELEASE_TABLET | Freq: Once | ORAL | Status: AC
  Administered 2022-04-15: 40 meq via ORAL
  Filled 2022-04-15: qty 2

## 2022-04-15 MED ORDER — ACETAMINOPHEN 325 MG PO TABS: 650.0000 mg | ORAL_TABLET | Freq: Four times a day (QID) | ORAL | Status: DC | PRN

## 2022-04-15 MED ORDER — AMLODIPINE BESYLATE 5 MG PO TABS
5.0000 mg | ORAL_TABLET | Freq: Every day | ORAL | Status: DC
  Administered 2022-04-15 – 2022-04-18 (×4): 5 mg via ORAL
  Filled 2022-04-15 (×4): qty 1

## 2022-04-15 MED ORDER — ZINC SULFATE 220 (50 ZN) MG PO CAPS
220.0000 mg | ORAL_CAPSULE | Freq: Every day | ORAL | Status: DC
  Administered 2022-04-15 – 2022-04-18 (×4): 220 mg via ORAL
  Filled 2022-04-15 (×4): qty 1

## 2022-04-15 MED ORDER — VITAMIN C 500 MG PO TABS
500.0000 mg | ORAL_TABLET | Freq: Every day | ORAL | Status: DC
  Administered 2022-04-15 – 2022-04-18 (×4): 500 mg via ORAL
  Filled 2022-04-15 (×4): qty 1

## 2022-04-15 MED ORDER — HYDROCOD POLI-CHLORPHE POLI ER 10-8 MG/5ML PO SUER: 5.0000 mL | Freq: Two times a day (BID) | ORAL | Status: DC | PRN

## 2022-04-15 MED ORDER — LORAZEPAM 0.5 MG PO TABS: 0.5000 mg | ORAL_TABLET | Freq: Two times a day (BID) | ORAL | Status: DC | PRN

## 2022-04-15 MED ORDER — ROSUVASTATIN CALCIUM 10 MG PO TABS
5.0000 mg | ORAL_TABLET | Freq: Every day | ORAL | Status: DC
  Administered 2022-04-15 – 2022-04-18 (×4): 5 mg via ORAL
  Filled 2022-04-15 (×4): qty 1

## 2022-04-15 MED ORDER — AMIODARONE HCL 200 MG PO TABS
200.0000 mg | ORAL_TABLET | Freq: Every day | ORAL | Status: DC
  Administered 2022-04-15 – 2022-04-18 (×4): 200 mg via ORAL
  Filled 2022-04-15 (×4): qty 1

## 2022-04-15 MED ORDER — SODIUM CHLORIDE 0.9% FLUSH
3.0000 mL | Freq: Two times a day (BID) | INTRAVENOUS | Status: DC
  Administered 2022-04-15 – 2022-04-18 (×6): 3 mL via INTRAVENOUS

## 2022-04-15 MED ORDER — FERROUS SULFATE 325 (65 FE) MG PO TABS
325.0000 mg | ORAL_TABLET | Freq: Every day | ORAL | Status: DC
  Administered 2022-04-16 – 2022-04-18 (×3): 325 mg via ORAL
  Filled 2022-04-15 (×3): qty 1

## 2022-04-15 MED ORDER — GUAIFENESIN-DM 100-10 MG/5ML PO SYRP: 10.0000 mL | ORAL_SOLUTION | ORAL | Status: DC | PRN

## 2022-04-15 MED ORDER — FAMOTIDINE 20 MG PO TABS
40.0000 mg | ORAL_TABLET | Freq: Every day | ORAL | Status: DC
  Administered 2022-04-15: 40 mg via ORAL
  Filled 2022-04-15 (×2): qty 2

## 2022-04-15 MED ORDER — FLUOXETINE HCL 20 MG PO CAPS
20.0000 mg | ORAL_CAPSULE | Freq: Every day | ORAL | Status: DC
  Administered 2022-04-15 – 2022-04-18 (×4): 20 mg via ORAL
  Filled 2022-04-15 (×4): qty 1

## 2022-04-15 MED ORDER — FUROSEMIDE 10 MG/ML IJ SOLN
40.0000 mg | Freq: Once | INTRAMUSCULAR | Status: AC
  Administered 2022-04-15: 40 mg via INTRAVENOUS
  Filled 2022-04-15: qty 4

## 2022-04-15 MED ORDER — METOPROLOL TARTRATE 50 MG PO TABS
50.0000 mg | ORAL_TABLET | Freq: Two times a day (BID) | ORAL | Status: DC
  Administered 2022-04-15 – 2022-04-18 (×6): 50 mg via ORAL
  Filled 2022-04-15 (×6): qty 1

## 2022-04-15 MED ORDER — PANTOPRAZOLE SODIUM 40 MG PO TBEC
40.0000 mg | DELAYED_RELEASE_TABLET | Freq: Every day | ORAL | Status: DC
  Administered 2022-04-15 – 2022-04-18 (×4): 40 mg via ORAL
  Filled 2022-04-15 (×4): qty 1

## 2022-04-15 MED ORDER — FUROSEMIDE 10 MG/ML IJ SOLN
40.0000 mg | Freq: Two times a day (BID) | INTRAMUSCULAR | Status: DC
  Administered 2022-04-16 – 2022-04-18 (×5): 40 mg via INTRAVENOUS
  Filled 2022-04-15 (×5): qty 4

## 2022-04-15 MED ORDER — ACETAMINOPHEN 650 MG RE SUPP: 650.0000 mg | Freq: Four times a day (QID) | RECTAL | Status: DC | PRN

## 2022-04-15 NOTE — ED Notes (Signed)
This Rn spoke with lab re: respiratory panel results, per staff the test is currently being run

## 2022-04-15 NOTE — Progress Notes (Signed)
Subjective:    Patient ID: Misty Edwards, female    DOB: 1926/10/13, 89 y.o.   MRN: [id number]   04/01/22 Patient was admitted in early December for atrial fibrillation.  She was then admitted in late December for pulmonary edema and fluid retention.  She was previously on Lasix 40 mg twice daily.  Recently she had lab work that showed her creatinine had increased from 0.9-1.6.  As result her cardiologist decreased her Lasix to 40 mg in the morning and 20 mg in the evening.  She is here today for follow-up.  She is still in atrial fibrillation today with an irregularly irregular rhythm.  She has +1 pitting edema in both legs up to her mid shin.  However she has bibasilar crackles in both lungs.  She denies any chest pain or shortness of breath.  Her weight today at home was 149 pounds.  At that time, my plan was: Patient appears to be fluid overloaded on exam however her most recent lab work suggested dehydration.  Therefore, I would like to repeat her BMP to see where her creatinine is prior to increasing her diuretic.  I believe her baseline creatinine is between 1.1 and 1.3.  Therefore if she is able increase Lasix up to 40 mg twice daily to avoid fluid retention and pulmonary edema.  We will likely need to monitor her creatinine closely.  We also discussed adding Jardiance for diastolic heart failure however she recently had a urinary tract infection and her creatinine and fluid status is still in flux so I would like to see the patient stable on her diuretic prior to adding an additional medication  04/10/22  Patient's labs at the last visit showed worsening kidney function so we elected to continue Lasix 40 mg in the morning and 20 mg in the evening.  However he has been worse over last few days.  Reports recent shortness of breath and cough.  On exam he has +1 edema in both legs and worsening crackles right greater than left.  On room air she is 88%.  She has gained approximately 2 pounds or 3  pounds on her scale at home.  At that time, my plan was: Very complicated patient with worsening kidney function but appears to be fluid overloaded with pulmonary edema.  Increase Lasix to 60 mg in the morning and 40 mg in the afternoon.  Obtain baseline renal function today with creatinine and potassium level.  Send the patient to the hospital to get a chest x-ray to rule out right-sided pneumonia given the asymmetry in the pulmonary crackles.  If x-ray confirms pulmonary edema and shows no evidence of infiltrate, I plan to recheck her renal function and electrolytes next week.  If her shortness of breath is worsening she needs to go to the hospital.  We will address her renal function next week but at the present time she is decompensating and appears to be fluid overloaded and requires diuresis.  04/15/22 CXR- IMPRESSION: 1. Decreased left pleural effusion with small to moderate residual. Small right pleural effusion. Patchy bibasilar opacities are likely atelectasis related to pleural effusions. Recommend clinical correlation for signs and symptoms of infection. 2. Unchanged diffuse interstitial prominence since December, likely pulmonary edema. BNP=493 Despite increasing lasix, no improvement in edema, so yesterday I recommended dosing zaroxolyn prior to lasix to improve diuresis.  Here for follow up.  Patient has gotten progressively weaker over the weekend.  Despite diuresing 3 pounds, she is still congested  audibly in both lungs.  Bilateral crackles.  She is short of breath with minimal activity.  She is unable to stand now.  She started having diarrhea over the weekend and last night was so weak that she could not even stand.  She is not running fevers.  However she did test positive for COVID yesterday.    Past Medical History:  Diagnosis Date   Acute biliary pancreatitis 07/2002   thia was in 07/2004:she still has pseudocyst in tail of pancreas measuring 54 x 33 mm    Adrenal adenoma     bilateral   Anxiety    CAD (coronary artery disease)    a. s/p CABG in 08/2014 with LIMA-LAD, SVG-OM and SVG-PDA   Chronic pancreatitis ([place])    Detached retina    Diabetes mellitus ([place])    Diverticulosis    Heartburn    History of carcinoma in situ of breast 1988   Hyperlipidemia    Hypertension    IBS (irritable bowel syndrome)    Legally blind    Osteoarthritis    Osteopenia    Upper GI bleed September2004   secondary to gastritis   Past Surgical History:  Procedure Laterality Date   CARDIAC CATHETERIZATION N/A 08/24/2014   Procedure: Left Heart Cath and Coronary Angiography;  Surgeon: Leonie Man, MD;  Location: [facility];  Service: Cardiovascular;  Laterality: N/A;   COLONOSCOPY  08/15/05   few tiny diverticula at sigmoid colon/external hemorrhoids but no polyps   COLONOSCOPY  01/08/2012   HUO:[id number] diverticulosis. Next colonoscopy in 12/2016   CORONARY ARTERY BYPASS GRAFT N/A 08/24/2014   Procedure: CORONARY ARTERY BYPASS GRAFTING (CABG) x three, using left internal mammary artery and right leg greater saphenous vein harvested endoscopically;  Surgeon: Ivin Poot, MD;  Location: [facility];  Service: Open Heart Surgery;  Laterality: N/A;   ECTOPIC PREGNANCY SURGERY  1950's   ERCP with sphincterotomy  07/2002   ESOPHAGOGASTRODUODENOSCOPY      Gastritis of body.  Otherwise normal   ESOPHAGOGASTRODUODENOSCOPY  01/08/2012   RMR: Few scattered gastric erosions of uncertain significance-status post biopsy. Minimal chronic inflammation, no H.pylori   LAPAROSCOPIC CHOLECYSTECTOMY     MASTECTOMY Right 1980   PANCREATIC PSEUDOCYST DRAINAGE  [id number]   drained percutaneously    right mastectomy     tacking up of her bladder     TEE WITHOUT CARDIOVERSION N/A 08/24/2014   Procedure: TRANSESOPHAGEAL ECHOCARDIOGRAM (TEE);  Surgeon: Ivin Poot, MD;  Location: [place];  Service: Open Heart Surgery;  Laterality: N/A;   Current Outpatient Medications on File Prior to Visit   Medication Sig Dispense Refill   benzonatate (TESSALON) 200 MG capsule Take 1 capsule (200 mg total) by mouth 3 (three) times daily as needed for cough. 30 capsule 0   amiodarone (PACERONE) 200 MG tablet Take 1 tablet twice a day for 3 weeks, then 1 tablet daily 60 tablet 3   amLODipine (NORVASC) 5 MG tablet TAKE 1 TABLET BY MOUTH ONCE DAILY . APPOINTMENT REQUIRED FOR FUTURE REFILLS (Patient taking differently: Take 5 mg by mouth daily.) 90 tablet 1   apixaban (ELIQUIS) 5 MG TABS tablet Take 1 tablet (5 mg total) by mouth 2 (two) times daily. 60 tablet 3   BD INSULIN SYRINGE U/F 31G X 5/16" 0.5 ML MISC USE AS DIRECTED TWICE DAILY 200 each 0   Blood Glucose Monitoring Suppl (BLOOD GLUCOSE SYSTEM PAK) KIT Please dispense as One Touch Ultra. Use as directed to monitor  FSBS 4x daily. Dx: E11.65 1 kit 1   calcium carbonate (TUMS - DOSED IN MG ELEMENTAL CALCIUM) 500 MG chewable tablet Chew 3 tablets by mouth at bedtime.     Continuous Blood Gluc Sensor (DEXCOM G7 SENSOR) MISC 1 Device by Does not apply route every 14 (fourteen) days. 3 each 11   CREON 24000-76000 units CPEP TAKE 2 CAPSULES BY MOUTH WITH MEALS AND 1 CAP WITH SNACKS x 2 300 capsule 11   EQ ALLERGY RELIEF, CETIRIZINE, 10 MG tablet Take 1 tablet by mouth once daily 90 tablet 0   famotidine (PEPCID) 40 MG tablet Take 1 tablet (40 mg total) by mouth daily. 30 tablet 3   ferrous sulfate 325 (65 FE) MG tablet Take 325 mg by mouth daily with breakfast.     FLUoxetine (PROZAC) 20 MG capsule Take 1 capsule (20 mg total) by mouth daily. 90 capsule 3   furosemide (LASIX) 20 MG tablet Take 60 mg in the AM and Take 40 mg in the PM Daily 450 tablet 3   gabapentin (NEURONTIN) 100 MG capsule TAKE 1 CAPSULE BY MOUTH THREE TIMES DAILY AS NEEDED FOR NERVE PAIN (Patient taking differently: Take 100 mg by mouth 3 (three) times daily as needed (nerve pain).) 90 capsule 0   insulin NPH-regular Human (NOVOLIN 70/30 RELION) (70-30) 100 UNIT/ML injection INJECT 25  UNITS SUBCUTANEOUSLY IN THE MORNING AND 15 IN THE EVENING (Patient taking differently: Inject into the skin See admin instructions. INJECT 42 UNITS SUBCUTANEOUSLY IN THE MORNING AND 22 IN THE EVENING) 10 mL 2   Lancets Misc. MISC Pt has Accu Chek Aviva Plus - Needs just lancets  Checks BS 4-5 times per day. Disp 3 boxes(90 day Supply)/4 refills Dx code. E11.9 450 each 3   LORazepam (ATIVAN) 1 MG tablet TAKE 1/2 TO 1 (ONE-HALF TO ONE) TABLET BY MOUTH TWICE DAILY AS NEEDED 30 tablet 0   MAGNESIUM-OXIDE 400 (240 Mg) MG tablet TAKE 1 TABLET BY MOUTH ONCE DAILY . APPOINTMENT REQUIRED FOR FUTURE REFILLS (Patient taking differently: Take 400 mg by mouth daily.) 30 tablet 6   metolazone (ZAROXOLYN) 5 MG tablet Take 1 tablet (5 mg total) by mouth once for 1 dose. Take 1 (one) tablet, 5 mg by mouth once for 1 dose, 30 minutes BEFORE morning Lasix dose. 1 tablet 0   metoprolol tartrate (LOPRESSOR) 50 MG tablet Take 1 tablet (50 mg total) by mouth 2 (two) times daily. 60 tablet 3   Multiple Vitamin (MULTIVITAMIN) capsule Take 1 capsule by mouth every morning.      OneTouch Delica Lancets 02R MISC CHECK BLOOD SUGAR 4-5 TIMES PER DAY 300 each 0   ONETOUCH ULTRA test strip USE 1 STRIP TO CHECK GLUCOSE FIVE TIMES DAILY 300 each 0   pantoprazole (PROTONIX) 40 MG tablet Take 1 tablet (40 mg total) by mouth daily. 90 tablet 3   potassium chloride (KLOR-CON) 10 MEQ tablet Take 1 tablet (10 mEq total) by mouth daily. 90 tablet 3   rosuvastatin (CRESTOR) 5 MG tablet Take 1 tablet by mouth once daily 90 tablet 3   triamcinolone cream (KENALOG) 0.1 % Apply topically 2 (two) times daily. 60 g 0   No current facility-administered medications on file prior to visit.   Allergies  Allergen Reactions   Calcium-Containing Compounds Nausea Only   Morphine And Related Other (See Comments)    Hallucination   Raloxifene Itching    Evista- Face and eyes burning   Vitamin D Analogs Nausea Only  Social History   Socioeconomic  History   Marital status: Widowed    Spouse name: Not on file   Number of children: 5   Years of education: Not on file   Highest education level: Not on file  Occupational History   Occupation: homemaker  Tobacco Use   Smoking status: Never   Smokeless tobacco: Never  Vaping Use   Vaping Use: Never used  Substance and Sexual Activity   Alcohol use: No    Alcohol/week: 0.0 standard drinks of alcohol   Drug use: No   Sexual activity: Not Currently    Birth control/protection: Post-menopausal  Other Topics Concern   Not on file  Social History Narrative   Lives in [place].   Social Determinants of Health   Financial Resource Strain: Not on file  Food Insecurity: No Food Insecurity (03/12/2022)   Hunger Vital Sign    Worried About Running Out of Food in the Last Year: Never true    Ran Out of Food in the Last Year: Never true  Transportation Needs: No Transportation Needs (03/12/2022)   PRAPARE - Hydrologist (Medical): No    Lack of Transportation (Non-Medical): No  Physical Activity: Not on file  Stress: Not on file  Social Connections: Not on file  Intimate Partner Violence: Not At Risk (03/12/2022)   Humiliation, Afraid, Rape, and Kick questionnaire    Fear of Current or Ex-Partner: No    Emotionally Abused: No    Physically Abused: No    Sexually Abused: No          Review of Systems  Musculoskeletal:  Positive for back pain.  Skin:  Positive for rash.  All other systems reviewed and are negative.      Objective:   Physical Exam Constitutional:      General: She is not in acute distress.    Appearance: Normal appearance. She is normal weight. She is ill-appearing. She is not toxic-appearing.  Cardiovascular:     Rate and Rhythm: Normal rate. Rhythm irregular.     Heart sounds: Normal heart sounds. No murmur heard.    No friction rub. No gallop.  Pulmonary:     Effort: Pulmonary effort is normal. No respiratory  distress.     Breath sounds: No stridor. Examination of the right-middle field reveals rales. Examination of the left-middle field reveals rales. Examination of the right-lower field reveals rales. Examination of the left-lower field reveals rales. Rales present. No wheezing or rhonchi.    Chest:     Chest wall: No tenderness.  Abdominal:     General: Abdomen is flat. Bowel sounds are normal. There is no distension.     Palpations: Abdomen is soft. There is no mass.     Tenderness: There is no abdominal tenderness.  Musculoskeletal:     Right lower leg: Edema present.     Left lower leg: Edema present.  Skin:    Findings: No erythema or rash.  Neurological:     General: No focal deficit present.     Mental Status: She is alert and oriented to person, place, and time. Mental status is at baseline.     Cranial Nerves: No cranial nerve deficit.     Sensory: No sensory deficit.     Motor: Weakness present.     Coordination: Coordination normal.           Assessment & Plan:  Renal function impairment  Acute congestive heart failure, unspecified heart  failure type [facility])  COVID Patient is in acute congestive heart failure.  She has bilateral crackles midway up in both of her lungs.  She has shortness of breath and profound weakness.  She needs diuresis.  However she also has renal insufficiency so diuresis needs to be monitored carefully.  She has failed outpatient diuresis with Lasix and metolazone and continues to have pulmonary edema on exam and appears to be fluid overloaded.  This is now complicated by being COVID-positive and having failure to thrive and weakness and unable to stand.  Therefore she needs to be in the hospital.  Recommended she go immediately to the emergency room

## 2022-04-15 NOTE — H&P (Signed)
History and Physical    Misty Edwards JKK:[id number] DOB: 25-May-1926 DOA: 04/15/2022  PCP: Susy Frizzle, MD  Patient coming from: Home/PCP office   Chief Complaint: SOB  HPI: Misty Edwards is a 89 y.o. female with medical history significant of chronic diastolic heart failure, A-fib, CAD, diabetes, hyperlipidemia, hypertension presents to the hospital with 1 week history of shortness of breath as well as cough.  She saw her primary care physician on 1/25, at which time her Lasix was increased.  She continued to exhibit shortness of breath and saw her PCP again 1/30 in the morning.  At that time, patient was seen to the emergency department due to failure of outpatient diuresis, fluid overload, COVID.  Patient's grandson is at bedside who provides most of the history.  He states that patient took a home COVID test yesterday which turned out positive.  Patient has not had any fevers but admits to chills.  Normally uses 2 L nasal cannula O2 as needed, but has been using it more recently.  Denies any nausea, vomiting, diarrhea or abdominal pain.  Has poor appetite.  ED Course: Labs show potassium 3.3, creatinine 1.49, BNP 882, troponin 23.  COVID-19 was positive.  Chest x-ray revealed small left greater than right pleural effusion unchanged from previous.  Improved aeration bilateral lower lungs.  Review of Systems: As per HPI. Otherwise, all other review of systems reviewed and are negative.   Past Medical History:  Diagnosis Date   Acute biliary pancreatitis 07/2002   thia was in 07/2004:she still has pseudocyst in tail of pancreas measuring 54 x 33 mm    Adrenal adenoma    bilateral   Anxiety    CAD (coronary artery disease)    a. s/p CABG in 08/2014 with LIMA-LAD, SVG-OM and SVG-PDA   Chronic pancreatitis ([place])    Detached retina    Diabetes mellitus ([place])    Diverticulosis    Heartburn    History of carcinoma in situ of breast 1988   Hyperlipidemia    Hypertension    IBS  (irritable bowel syndrome)    Legally blind    Osteoarthritis    Osteopenia    Upper GI bleed September2004   secondary to gastritis    Past Surgical History:  Procedure Laterality Date   CARDIAC CATHETERIZATION N/A 08/24/2014   Procedure: Left Heart Cath and Coronary Angiography;  Surgeon: Leonie Man, MD;  Location: [facility];  Service: Cardiovascular;  Laterality: N/A;   COLONOSCOPY  08/15/05   few tiny diverticula at sigmoid colon/external hemorrhoids but no polyps   COLONOSCOPY  01/08/2012   ZJI:RCVELFY diverticulosis. Next colonoscopy in 12/2016   CORONARY ARTERY BYPASS GRAFT N/A 08/24/2014   Procedure: CORONARY ARTERY BYPASS GRAFTING (CABG) x three, using left internal mammary artery and right leg greater saphenous vein harvested endoscopically;  Surgeon: Ivin Poot, MD;  Location: [facility];  Service: Open Heart Surgery;  Laterality: N/A;   ECTOPIC PREGNANCY SURGERY  1950's   ERCP with sphincterotomy  07/2002   ESOPHAGOGASTRODUODENOSCOPY      Gastritis of body.  Otherwise normal   ESOPHAGOGASTRODUODENOSCOPY  01/08/2012   RMR: Few scattered gastric erosions of uncertain significance-status post biopsy. Minimal chronic inflammation, no H.pylori   LAPAROSCOPIC CHOLECYSTECTOMY     MASTECTOMY Right 1980   PANCREATIC PSEUDOCYST DRAINAGE  [id number]   drained percutaneously    right mastectomy     tacking up of her bladder     TEE WITHOUT CARDIOVERSION  N/A 08/24/2014   Procedure: TRANSESOPHAGEAL ECHOCARDIOGRAM (TEE);  Surgeon: Ivin Poot, MD;  Location: [facility];  Service: Open Heart Surgery;  Laterality: N/A;     reports that she has never smoked. She has never used smokeless tobacco. She reports that she does not drink alcohol and does not use drugs.  Allergies  Allergen Reactions   Calcium-Containing Compounds Nausea Only   Morphine And Related Other (See Comments)    Hallucination   Raloxifene Itching    Evista- Face and eyes burning   Vitamin D Analogs Nausea  Only    Family History  Problem Relation Age of Onset   Ovarian cancer Mother        passed   Colon cancer Father 47       passed away in his 5's   Colon cancer Brother 76       surgery inhis 56's doing well now   Heart attack Brother        passed away age 32   Pancreatitis Brother    Kidney disease Daughter    Leukemia Son     Prior to Admission medications   Medication Sig Start Date End Date Taking? Authorizing Provider  benzonatate (TESSALON) 200 MG capsule Take 1 capsule (200 mg total) by mouth 3 (three) times daily as needed for cough. 04/14/22   Susy Frizzle, MD  amiodarone (PACERONE) 200 MG tablet Take 1 tablet twice a day for 3 weeks, then 1 tablet daily 03/05/22   Dwyane Dee, MD  amLODipine (NORVASC) 5 MG tablet TAKE 1 TABLET BY MOUTH ONCE DAILY . APPOINTMENT REQUIRED FOR FUTURE REFILLS Patient taking differently: Take 5 mg by mouth daily. 02/27/22   Arnoldo Lenis, MD  apixaban (ELIQUIS) 5 MG TABS tablet Take 1 tablet (5 mg total) by mouth 2 (two) times daily. 03/05/22   Dwyane Dee, MD  BD INSULIN SYRINGE U/F 31G X 5/16" 0.5 ML MISC USE AS DIRECTED TWICE DAILY 02/17/22   Susy Frizzle, MD  Blood Glucose Monitoring Suppl (BLOOD GLUCOSE SYSTEM PAK) KIT Please dispense as One Touch Ultra. Use as directed to monitor FSBS 4x daily. Dx: E11.65 11/16/20   Susy Frizzle, MD  calcium carbonate (TUMS - DOSED IN MG ELEMENTAL CALCIUM) 500 MG chewable tablet Chew 3 tablets by mouth at bedtime.    [provider]  Continuous Blood Gluc Sensor (DEXCOM G7 SENSOR) MISC 1 Device by Does not apply route every 14 (fourteen) days. 04/01/22   Susy Frizzle, MD  CREON [id number] units CPEP TAKE 2 CAPSULES BY MOUTH WITH MEALS AND 1 CAP WITH SNACKS x 2 06/26/20   Susy Frizzle, MD  EQ ALLERGY RELIEF, CETIRIZINE, 10 MG tablet Take 1 tablet by mouth once daily 02/27/22   Susy Frizzle, MD  famotidine (PEPCID) 40 MG tablet Take 1 tablet (40 mg total) by mouth  daily. 03/28/22   Susy Frizzle, MD  ferrous sulfate 325 (65 FE) MG tablet Take 325 mg by mouth daily with breakfast.    [provider]  FLUoxetine (PROZAC) 20 MG capsule Take 1 capsule (20 mg total) by mouth daily. 07/05/21   Susy Frizzle, MD  furosemide (LASIX) 20 MG tablet Take 60 mg in the AM and Take 40 mg in the PM Daily 04/14/22   Susy Frizzle, MD  gabapentin (NEURONTIN) 100 MG capsule TAKE 1 CAPSULE BY MOUTH THREE TIMES DAILY AS NEEDED FOR NERVE PAIN Patient taking differently: Take 100 mg by mouth  3 (three) times daily as needed (nerve pain). 01/30/22   Susy Frizzle, MD  insulin NPH-regular Human (NOVOLIN 70/30 RELION) (70-30) 100 UNIT/ML injection INJECT 25 UNITS SUBCUTANEOUSLY IN THE MORNING AND 15 IN THE EVENING Patient taking differently: Inject into the skin See admin instructions. INJECT 42 UNITS SUBCUTANEOUSLY IN THE MORNING AND 22 IN THE EVENING 09/28/19   Susy Frizzle, MD  Lancets Misc. MISC Pt has Accu Chek Aviva Plus - Needs just lancets  Checks BS 4-5 times per day. Disp 3 boxes(90 day Supply)/4 refills Dx code. E11.9 10/13/19   Susy Frizzle, MD  LORazepam (ATIVAN) 1 MG tablet TAKE 1/2 TO 1 (ONE-HALF TO ONE) TABLET BY MOUTH TWICE DAILY AS NEEDED 04/08/22   Susy Frizzle, MD  MAGNESIUM-OXIDE 400 (240 Mg) MG tablet TAKE 1 TABLET BY MOUTH ONCE DAILY . APPOINTMENT REQUIRED FOR FUTURE REFILLS Patient taking differently: Take 400 mg by mouth daily. 12/23/21   Arnoldo Lenis, MD  metolazone (ZAROXOLYN) 5 MG tablet Take 1 tablet (5 mg total) by mouth once for 1 dose. Take 1 (one) tablet, 5 mg by mouth once for 1 dose, 30 minutes BEFORE morning Lasix dose. 04/14/22 04/14/22  Susy Frizzle, MD  metoprolol tartrate (LOPRESSOR) 50 MG tablet Take 1 tablet (50 mg total) by mouth 2 (two) times daily. 03/05/22   Dwyane Dee, MD  Multiple Vitamin (MULTIVITAMIN) capsule Take 1 capsule by mouth every morning.     [provider]  OneTouch Delica  Lancets 09T MISC CHECK BLOOD SUGAR 4-5 TIMES PER DAY 10/22/20   Susy Frizzle, MD  [facility] ULTRA test strip USE 1 STRIP TO CHECK GLUCOSE FIVE TIMES DAILY 03/27/22   Susy Frizzle, MD  pantoprazole (PROTONIX) 40 MG tablet Take 1 tablet (40 mg total) by mouth daily. 03/26/22   Susy Frizzle, MD  potassium chloride (KLOR-CON) 10 MEQ tablet Take 1 tablet (10 mEq total) by mouth daily. 03/31/22 03/26/23  Arnoldo Lenis, MD  rosuvastatin (CRESTOR) 5 MG tablet Take 1 tablet by mouth once daily 12/16/21   Arnoldo Lenis, MD  triamcinolone cream (KENALOG) 0.1 % Apply topically 2 (two) times daily. 01/10/22   Susy Frizzle, MD    Physical Exam: Vitals:   04/15/22 1300 04/15/22 1330 04/15/22 1400 04/15/22 1430  BP: (!) 117/52 137/65 (!) 147/68 (!) 147/67  Pulse: 63 70 69 75  Resp: 16 18 (!) 21 (!) 23  Temp:      TempSrc:      SpO2: 100% 95% 97% 95%  Weight:      Height:        Constitutional: NAD, calm, comfortable Eyes: PERRL, lids and conjunctivae normal Respiratory: Normal respiratory effort. No accessory muscle use. No conversational dyspnea. Comfortable on 2L O2  Cardiovascular: Irregular rate and rhythm, no murmurs. +bilateral extremity edema.  Abdomen: Soft, nondistended, nontender to palpation. Bowel sounds positive.  Musculoskeletal: No joint deformity upper and lower extremities. No contractures. Normal muscle tone.  Skin: no rashes, lesions, ulcers on exposed skin  Neurologic: Alert and oriented, speech fluent, CN 2-12 grossly intact. No focal deficits.   Psychiatric: Normal judgment and insight. Normal mood and affect   Labs on Admission: I have personally reviewed following labs and imaging studies  CBC: Recent Labs  Lab 04/15/22 1131  WBC 5.3  NEUTROABS 3.4  HGB 10.7*  HCT 35.0*  MCV 86.8  PLT 267*   Basic Metabolic Panel: Recent Labs  Lab 04/10/22 1504 04/15/22 1131  NA 139 137  K 4.4 3.3*  CL 98 95*  CO2 35* 31  GLUCOSE 92 113*  BUN 28* 35*   CREATININE 1.53* 1.49*  CALCIUM 9.1 8.5*   GFR: Estimated Creatinine Clearance: 21.8 mL/min (A) (by C-G formula based on SCr of 1.49 mg/dL (H)). Liver Function Tests: No results for input(s): "AST", "ALT", "ALKPHOS", "BILITOT", "PROT", "ALBUMIN" in the last 168 hours. No results for input(s): "LIPASE", "AMYLASE" in the last 168 hours. No results for input(s): "AMMONIA" in the last 168 hours. Coagulation Profile: No results for input(s): "INR", "PROTIME" in the last 168 hours. Cardiac Enzymes: No results for input(s): "CKTOTAL", "CKMB", "CKMBINDEX", "TROPONINI" in the last 168 hours. BNP (last 3 results) No results for input(s): "PROBNP" in the last 8760 hours. HbA1C: No results for input(s): "HGBA1C" in the last 72 hours. CBG: Recent Labs  Lab 04/15/22 1059  GLUCAP 106*   Lipid Profile: No results for input(s): "CHOL", "HDL", "LDLCALC", "TRIG", "CHOLHDL", "LDLDIRECT" in the last 72 hours. Thyroid Function Tests: No results for input(s): "TSH", "T4TOTAL", "FREET4", "T3FREE", "THYROIDAB" in the last 72 hours. Anemia Panel: No results for input(s): "VITAMINB12", "FOLATE", "FERRITIN", "TIBC", "IRON", "RETICCTPCT" in the last 72 hours. Urine analysis:    Component Value Date/Time   COLORURINE STRAW (A) 03/11/2022 1635   APPEARANCEUR CLEAR 03/11/2022 1635   LABSPEC 1.005 03/11/2022 1635   PHURINE 6.0 03/11/2022 1635   GLUCOSEU NEGATIVE 03/11/2022 1635   HGBUR NEGATIVE 03/11/2022 1635   BILIRUBINUR NEGATIVE 03/11/2022 1635   KETONESUR NEGATIVE 03/11/2022 1635   PROTEINUR NEGATIVE 03/11/2022 1635   UROBILINOGEN 0.2 09/02/2014 1445   NITRITE NEGATIVE 03/11/2022 1635   LEUKOCYTESUR NEGATIVE 03/11/2022 1635   Sepsis Labs: !!!!!!!!!!!!!!!!!!!!!!!!!!!!!!!!!!!!!!!!!!!! '@LABRCNTIP'$ (procalcitonin:4,lacticidven:4) ) Recent Results (from the past 240 hour(s))  Resp panel by RT-PCR (RSV, Flu A&B, Covid) Anterior Nasal Swab     Status: Abnormal   Collection Time: 04/15/22 11:05 AM    Specimen: Anterior Nasal Swab  Result Value Ref Range Status   SARS Coronavirus 2 by RT PCR POSITIVE (A) NEGATIVE Final    Comment: (NOTE) SARS-CoV-2 target nucleic acids are DETECTED.  The SARS-CoV-2 RNA is generally detectable in upper respiratory specimens during the acute phase of infection. Positive results are indicative of the presence of the identified virus, but do not rule out bacterial infection or co-infection with other pathogens not detected by the test. Clinical correlation with patient history and other diagnostic information is necessary to determine patient infection status. The expected result is Negative.  Fact Sheet for Patients: EntrepreneurPulse.com.au  Fact Sheet for Healthcare Providers: IncredibleEmployment.be  This test is not yet approved or cleared by the [place] FDA and  has been authorized for detection and/or diagnosis of SARS-CoV-2 by FDA under an Emergency Use Authorization (EUA).  This EUA will remain in effect (meaning this test can be used) for the duration of  the COVID-19 declaration under Section 564(b)(1) of the A ct, 21 U.S.C. section 360bbb-3(b)(1), unless the authorization is terminated or revoked sooner.     Influenza A by PCR NEGATIVE NEGATIVE Final   Influenza B by PCR NEGATIVE NEGATIVE Final    Comment: (NOTE) The Xpert Xpress SARS-CoV-2/FLU/RSV plus assay is intended as an aid in the diagnosis of influenza from Nasopharyngeal swab specimens and should not be used as a sole basis for treatment. Nasal washings and aspirates are unacceptable for Xpert Xpress SARS-CoV-2/FLU/RSV testing.  Fact Sheet for Patients: EntrepreneurPulse.com.au  Fact Sheet for Healthcare Providers: IncredibleEmployment.be  This test is not yet  approved or cleared by the [place] and has been authorized for detection and/or diagnosis of SARS-CoV-2 by FDA under an  Emergency Use Authorization (EUA). This EUA will remain in effect (meaning this test can be used) for the duration of the COVID-19 declaration under Section 564(b)(1) of the Act, 21 U.S.C. section 360bbb-3(b)(1), unless the authorization is terminated or revoked.     Resp Syncytial Virus by PCR NEGATIVE NEGATIVE Final    Comment: (NOTE) Fact Sheet for Patients: EntrepreneurPulse.com.au  Fact Sheet for Healthcare Providers: IncredibleEmployment.be  This test is not yet approved or cleared by the [place] FDA and has been authorized for detection and/or diagnosis of SARS-CoV-2 by FDA under an Emergency Use Authorization (EUA). This EUA will remain in effect (meaning this test can be used) for the duration of the COVID-19 declaration under Section 564(b)(1) of the Act, 21 U.S.C. section 360bbb-3(b)(1), unless the authorization is terminated or revoked.  Performed at [facility], [address] [place], [place] [address]      Radiological Exams on Admission: DG Chest Portable 1 View  Result Date: 04/15/2022 CLINICAL DATA:  Shortness of breath with chest congestion. EXAM: PORTABLE CHEST 1 VIEW COMPARISON:  AP chest 04/10/2022 FINDINGS: Status post median sternotomy and CABG. Cardiac silhouette is again at the upper limits of normal size. Mediastinal contours are within normal limits. Small left-greater-than-right pleural effusions are unchanged to mildly improved from prior. Improved aeration of the bilateral lower lungs, now with mild interstitial thickening but resolution of the prior heterogeneous airspace opacities. No pneumothorax is seen. Numerous surgical clips again overlie the right axilla and right chest wall. No acute skeletal abnormality. IMPRESSION: 1. Small left-greater-than-right pleural effusions are unchanged to mildly improved from prior. 2. Improved aeration of the bilateral lower lungs. Electronically Signed   By: Yvonne Kendall  M.D.   On: 04/15/2022 11:56    EKG: Independently reviewed. A Fib rate 74   Assessment/Plan Principal Problem:   Acute on chronic diastolic CHF (congestive heart failure) (HCC) Active Problems:   Type 2 diabetes mellitus with vascular disease (HCC)   GERD   S/P CABG x 3   Dyslipidemia   Acute pulmonary edema (HCC)   Atrial fibrillation (HCC)   COVID-19 virus infection   Acute on chronic diastolic CHF exacerbation  -Followed by Dr. Harl Bowie -BNP 882 -Hold home lasix and metolazone -IV lasix -Lopressor  -Strict I/Os, daily weight  -Consult cardiology   COVID-19  -Tested positive 1/29 via home test -Her symptoms began > 5 days, supportive care recommended at this time per new treatment algorithm. She is not requiring above her baseline O2 needs  -Supportive treatments as ordered, encourage IS, encourage prone positioning, encourage mobilization   Chronic respiratory failure -Uses 2L O2 prn  A Fib -Amioadrone, lopressor, eliquis  HTN -Norvasc, lopressor   Demand ischemia -Trop 23   CKD stage 3a -Baseline Cr 1-1.3   DM -A1c 6 -SSI  Hypokalemia  -Replace  HLD -Crestor      DVT prophylaxis: Eliquis  Code Status: Full, confirmed with grandson at bedside  Family Communication: Grandson at bedside  Disposition Plan: Home Consults called: Cardiology    Severity of Illness: The appropriate patient status for this patient is INPATIENT. Inpatient status is judged to be reasonable and necessary in order to provide the required intensity of service to ensure the patient's safety. The patient's presenting symptoms, physical exam findings, and initial radiographic and laboratory data in the context of their chronic comorbidities is felt  to place them at high risk for further clinical deterioration. Furthermore, it is not anticipated that the patient will be medically stable for discharge from the hospital within 2 midnights of admission.   * I certify that at the  point of admission it is my clinical judgment that the patient will require inpatient hospital care spanning beyond 2 midnights from the point of admission due to high intensity of service, high risk for further deterioration and high frequency of surveillance required.Dessa Phi, DO [facility] 04/15/2022, 2:45 PM   Available via Epic secure chat 7am-7pm After these hours, please refer to coverage provider listed on [email or web address]

## 2022-04-15 NOTE — ED Provider Notes (Signed)
[facility] Provider Note   CSN: [id number] Arrival date & time: 04/15/22  1022     History  Chief Complaint  Patient presents with   Pulmonary Edema    Misty Edwards is a 89 y.o. female with a history including hypertension, a fib on eliquis, diabetes, chronic renal insufficiency, CHF and CAD sent here by her primary provider who saw her this morning in office and is concerned for worsening CHF.  She had several recent admissions for CHF, and has had close follow-up with her primary provider but has continued to struggle with fluid retention despite following her doctors recommendations of salt restriction and fluid restriction and taking her medications as directed.  Son at the bedside gives significant amount of patient's history.  She has had increasing shortness of breath with exertion, she denies chest pain.  She was sent home from her last hospitalization with as needed oxygen 2 L which she uses as needed, son states she does not typically need oxygen when at rest.  She has continued to have increasing generalized weakness, she takes Lasix 60 mg every morning and 40 nightly, a dose of metolazone was added yesterday as well and apparently had at least a 3 pound weight loss from yesterday today but continues to have weakness and shortness of breath with exertion.  She typically uses a walker to ambulate but was unable to control her walker today without assistance which she blames on general weakness.  She has had a cough with clear sputum, she took a home COVID test yesterday evening which was positive.  She denies fevers or chills.  She does endorse poor p.o. intake and states she just does not have an appetite.   HPI     Home Medications Prior to Admission medications   Medication Sig Start Date End Date Taking? Authorizing Provider  benzonatate (TESSALON) 200 MG capsule Take 1 capsule (200 mg total) by mouth 3 (three) times daily as  needed for cough. 04/14/22   Susy Frizzle, MD  amiodarone (PACERONE) 200 MG tablet Take 1 tablet twice a day for 3 weeks, then 1 tablet daily 03/05/22   Dwyane Dee, MD  amLODipine (NORVASC) 5 MG tablet TAKE 1 TABLET BY MOUTH ONCE DAILY . APPOINTMENT REQUIRED FOR FUTURE REFILLS Patient taking differently: Take 5 mg by mouth daily. 02/27/22   Arnoldo Lenis, MD  apixaban (ELIQUIS) 5 MG TABS tablet Take 1 tablet (5 mg total) by mouth 2 (two) times daily. 03/05/22   Dwyane Dee, MD  BD INSULIN SYRINGE U/F 31G X 5/16" 0.5 ML MISC USE AS DIRECTED TWICE DAILY 02/17/22   Susy Frizzle, MD  Blood Glucose Monitoring Suppl (BLOOD GLUCOSE SYSTEM PAK) KIT Please dispense as One Touch Ultra. Use as directed to monitor FSBS 4x daily. Dx: E11.65 11/16/20   Susy Frizzle, MD  calcium carbonate (TUMS - DOSED IN MG ELEMENTAL CALCIUM) 500 MG chewable tablet Chew 3 tablets by mouth at bedtime.    [provider]  Continuous Blood Gluc Sensor (DEXCOM G7 SENSOR) MISC 1 Device by Does not apply route every 14 (fourteen) days. 04/01/22   Susy Frizzle, MD  CREON [phone] units CPEP TAKE 2 CAPSULES BY MOUTH WITH MEALS AND 1 CAP WITH SNACKS x 2 06/26/20   Susy Frizzle, MD  EQ ALLERGY RELIEF, CETIRIZINE, 10 MG tablet Take 1 tablet by mouth once daily 02/27/22   Susy Frizzle, MD  famotidine (PEPCID) 40  MG tablet Take 1 tablet (40 mg total) by mouth daily. 03/28/22   Susy Frizzle, MD  ferrous sulfate 325 (65 FE) MG tablet Take 325 mg by mouth daily with breakfast.    [provider]  FLUoxetine (PROZAC) 20 MG capsule Take 1 capsule (20 mg total) by mouth daily. 07/05/21   Susy Frizzle, MD  furosemide (LASIX) 20 MG tablet Take 60 mg in the AM and Take 40 mg in the PM Daily 04/14/22   Susy Frizzle, MD  gabapentin (NEURONTIN) 100 MG capsule TAKE 1 CAPSULE BY MOUTH THREE TIMES DAILY AS NEEDED FOR NERVE PAIN Patient taking differently: Take 100 mg by mouth 3 (three)  times daily as needed (nerve pain). 01/30/22   Susy Frizzle, MD  insulin NPH-regular Human (NOVOLIN 70/30 RELION) (70-30) 100 UNIT/ML injection INJECT 25 UNITS SUBCUTANEOUSLY IN THE MORNING AND 15 IN THE EVENING Patient taking differently: Inject into the skin See admin instructions. INJECT 42 UNITS SUBCUTANEOUSLY IN THE MORNING AND 22 IN THE EVENING 09/28/19   Susy Frizzle, MD  Lancets Misc. MISC Pt has Accu Chek Aviva Plus - Needs just lancets  Checks BS 4-5 times per day. Disp 3 boxes(90 day Supply)/4 refills Dx code. E11.9 10/13/19   Susy Frizzle, MD  LORazepam (ATIVAN) 1 MG tablet TAKE 1/2 TO 1 (ONE-HALF TO ONE) TABLET BY MOUTH TWICE DAILY AS NEEDED 04/08/22   Susy Frizzle, MD  MAGNESIUM-OXIDE 400 (240 Mg) MG tablet TAKE 1 TABLET BY MOUTH ONCE DAILY . APPOINTMENT REQUIRED FOR FUTURE REFILLS Patient taking differently: Take 400 mg by mouth daily. 12/23/21   Arnoldo Lenis, MD  metolazone (ZAROXOLYN) 5 MG tablet Take 1 tablet (5 mg total) by mouth once for 1 dose. Take 1 (one) tablet, 5 mg by mouth once for 1 dose, 30 minutes BEFORE morning Lasix dose. 04/14/22 04/14/22  Susy Frizzle, MD  metoprolol tartrate (LOPRESSOR) 50 MG tablet Take 1 tablet (50 mg total) by mouth 2 (two) times daily. 03/05/22   Dwyane Dee, MD  Multiple Vitamin (MULTIVITAMIN) capsule Take 1 capsule by mouth every morning.     [provider]  OneTouch Delica Lancets 28U MISC CHECK BLOOD SUGAR 4-5 TIMES PER DAY 10/22/20   Susy Frizzle, MD  [facility] ULTRA test strip USE 1 STRIP TO CHECK GLUCOSE FIVE TIMES DAILY 03/27/22   Susy Frizzle, MD  pantoprazole (PROTONIX) 40 MG tablet Take 1 tablet (40 mg total) by mouth daily. 03/26/22   Susy Frizzle, MD  potassium chloride (KLOR-CON) 10 MEQ tablet Take 1 tablet (10 mEq total) by mouth daily. 03/31/22 03/26/23  Arnoldo Lenis, MD  rosuvastatin (CRESTOR) 5 MG tablet Take 1 tablet by mouth once daily 12/16/21   Arnoldo Lenis, MD   triamcinolone cream (KENALOG) 0.1 % Apply topically 2 (two) times daily. 01/10/22   Susy Frizzle, MD      Allergies    Calcium-containing compounds, Morphine and related, Raloxifene, and Vitamin d analogs    Review of Systems   Review of Systems  Constitutional:  Positive for fatigue. Negative for chills and fever.  HENT:  Negative for congestion and sore throat.   Eyes: Negative.   Respiratory:  Positive for cough and shortness of breath. Negative for chest tightness.   Cardiovascular:  Positive for leg swelling. Negative for chest pain and palpitations.       Lower extremity edema,  baseline per pt and son.  Gastrointestinal:  Negative for  abdominal pain and nausea.  Genitourinary: Negative.   Musculoskeletal:  Negative for arthralgias, joint swelling and neck pain.  Skin: Negative.  Negative for rash and wound.  Neurological:  Positive for weakness. Negative for dizziness, light-headedness, numbness and headaches.  Psychiatric/Behavioral: Negative.    All other systems reviewed and are negative.   Physical Exam Updated Vital Signs BP (!) 147/67   Pulse 75   Temp 99.1 F (37.3 C) (Oral)   Resp (!) 23   Ht '5\' 3"'$  (1.6 m)   Wt 68 kg   SpO2 95%   BMI 26.56 kg/m  Physical Exam Vitals and nursing note reviewed.  Constitutional:      Appearance: She is well-developed.     Comments: Appears fatigued.  HENT:     Head: Normocephalic and atraumatic.  Eyes:     Conjunctiva/sclera: Conjunctivae normal.  Cardiovascular:     Rate and Rhythm: Normal rate. Rhythm irregular.     Heart sounds: Normal heart sounds.  Pulmonary:     Effort: Pulmonary effort is normal.     Breath sounds: Rales present. No wheezing.     Comments: No respiratory distress,  resting, 2 L [place] Abdominal:     General: Bowel sounds are normal.     Palpations: Abdomen is soft.     Tenderness: There is no abdominal tenderness.  Musculoskeletal:        General: Normal range of motion.     Cervical  back: Normal range of motion.     Right lower leg: Edema present.     Left lower leg: Edema present.  Skin:    General: Skin is warm and dry.  Neurological:     Mental Status: She is alert.     ED Results / Procedures / Treatments   Labs (all labs ordered are listed, but only abnormal results are displayed) Labs Reviewed  RESP PANEL BY RT-PCR (RSV, FLU A&B, COVID)  RVPGX2 - Abnormal; Notable for the following components:      Result Value   SARS Coronavirus 2 by RT PCR POSITIVE (*)    All other components within normal limits  CBC WITH DIFFERENTIAL/PLATELET - Abnormal; Notable for the following components:   Hemoglobin 10.7 (*)    HCT 35.0 (*)    RDW 15.9 (*)    Platelets 121 (*)    All other components within normal limits  BASIC METABOLIC PANEL - Abnormal; Notable for the following components:   Potassium 3.3 (*)    Chloride 95 (*)    Glucose, Bld 113 (*)    BUN 35 (*)    Creatinine, Ser 1.49 (*)    Calcium 8.5 (*)    GFR, Estimated 33 (*)    All other components within normal limits  BRAIN NATRIURETIC PEPTIDE - Abnormal; Notable for the following components:   B Natriuretic Peptide 882.0 (*)    All other components within normal limits  CBG MONITORING, ED - Abnormal; Notable for the following components:   Glucose-Capillary 106 (*)    All other components within normal limits  TROPONIN I (HIGH SENSITIVITY) - Abnormal; Notable for the following components:   Troponin I (High Sensitivity) 23 (*)    All other components within normal limits  POC OCCULT BLOOD, ED    EKG EKG Interpretation  Date/Time:  Tuesday April 15 2022 10:54:14 EST Ventricular Rate:  74 PR Interval:    QRS Duration: 98 QT Interval:  441 QTC Calculation: 490 R Axis:   17 Text Interpretation:  Atrial fibrillation Borderline repolarization abnormality Borderline prolonged QT interval Confirmed by Godfrey Pick [phone]) on 04/15/2022 1:02:33 PM  Radiology DG Chest Portable 1 View  Result Date:  04/15/2022 CLINICAL DATA:  Shortness of breath with chest congestion. EXAM: PORTABLE CHEST 1 VIEW COMPARISON:  AP chest 04/10/2022 FINDINGS: Status post median sternotomy and CABG. Cardiac silhouette is again at the upper limits of normal size. Mediastinal contours are within normal limits. Small left-greater-than-right pleural effusions are unchanged to mildly improved from prior. Improved aeration of the bilateral lower lungs, now with mild interstitial thickening but resolution of the prior heterogeneous airspace opacities. No pneumothorax is seen. Numerous surgical clips again overlie the right axilla and right chest wall. No acute skeletal abnormality. IMPRESSION: 1. Small left-greater-than-right pleural effusions are unchanged to mildly improved from prior. 2. Improved aeration of the bilateral lower lungs. Electronically Signed   By: Yvonne Kendall M.D.   On: 04/15/2022 11:56    Procedures Procedures    Medications Ordered in ED Medications  furosemide (LASIX) injection 40 mg (40 mg Intravenous Given 04/15/22 1316)    ED Course/ Medical Decision Making/ A&P                             Medical Decision Making Patient presenting with concern for worsening CHF despite increased home diuresis, she is currently on furosemide 60 mg every morning, 40 mg nightly, added metolazone yesterday and diuresed overnight with a 3 pound weight loss but continues to have generalized weakness along with increased shortness of breath with exertion.  Seen by PCP and sent here for admission, with her renal insufficiency she will need careful diuresis.  She also had a home COVID test yesterday which was positive, she has had a cough, denies fevers.  Respiratory panel is currently pending to confirm this diagnosis.  She is given 40 mg of Lasix IV here.  Patient will need admission.  Amount and/or Complexity of Data Reviewed Labs: ordered.    Details: Reviewed, significant for a BUN of 35 and a creatinine of 1.49.   The creatinine is stable, her BNP is significantly elevated at 882.  She has a normal WBC count at 5.3.  Her hemoglobin is 10.7.  Covid +.  Radiology: ordered.    Details: Reviewed,  suggests unchanged to mildly improved bilateral pleural effusions.  ECG/medicine tests: ordered and independent interpretation performed.    Details: A fib, unchanged Discussion of management or test interpretation with external provider(s): Pt Discussed with Dr. Maylene Roes who accepts pt for admission.   Risk Decision regarding hospitalization.           Final Clinical Impression(s) / ED Diagnoses Final diagnoses:  Acute on chronic congestive heart failure, unspecified heart failure type [facility])  COVID-19    Rx / DC Orders ED Discharge Orders     None         Landis Martins 04/15/22 1449    Godfrey Pick, MD 04/19/22 [phone]

## 2022-04-15 NOTE — ED Triage Notes (Signed)
Patient was sent from [place] for pulmonary Edema. Pt has been covid positive x7days. Patient wears O2 and denies feeling any more SOB than usual. Denies pain. 99% on 2L

## 2022-04-16 DIAGNOSIS — I4891 Unspecified atrial fibrillation: Secondary | ICD-10-CM | POA: Diagnosis not present

## 2022-04-16 DIAGNOSIS — I5033 Acute on chronic diastolic (congestive) heart failure: Secondary | ICD-10-CM

## 2022-04-16 DIAGNOSIS — J810 Acute pulmonary edema: Secondary | ICD-10-CM | POA: Diagnosis not present

## 2022-04-16 DIAGNOSIS — I340 Nonrheumatic mitral (valve) insufficiency: Secondary | ICD-10-CM

## 2022-04-16 DIAGNOSIS — I25810 Atherosclerosis of coronary artery bypass graft(s) without angina pectoris: Secondary | ICD-10-CM | POA: Diagnosis not present

## 2022-04-16 DIAGNOSIS — E1159 Type 2 diabetes mellitus with other circulatory complications: Secondary | ICD-10-CM

## 2022-04-16 DIAGNOSIS — J9621 Acute and chronic respiratory failure with hypoxia: Secondary | ICD-10-CM | POA: Diagnosis not present

## 2022-04-16 DIAGNOSIS — E7849 Other hyperlipidemia: Secondary | ICD-10-CM

## 2022-04-16 DIAGNOSIS — U071 COVID-19: Secondary | ICD-10-CM

## 2022-04-16 DIAGNOSIS — I4811 Longstanding persistent atrial fibrillation: Secondary | ICD-10-CM

## 2022-04-16 LAB — COMPREHENSIVE METABOLIC PANEL
ALT: 78 U/L — ABNORMAL HIGH (ref 0–44)
AST: 51 U/L — ABNORMAL HIGH (ref 15–41)
Albumin: 3.4 g/dL — ABNORMAL LOW (ref 3.5–5.0)
Alkaline Phosphatase: 61 U/L (ref 38–126)
Anion gap: 11 (ref 5–15)
BUN: 30 mg/dL — ABNORMAL HIGH (ref 8–23)
CO2: 33 mmol/L — ABNORMAL HIGH (ref 22–32)
Calcium: 8.7 mg/dL — ABNORMAL LOW (ref 8.9–10.3)
Chloride: 92 mmol/L — ABNORMAL LOW (ref 98–111)
Creatinine, Ser: 1.31 mg/dL — ABNORMAL HIGH (ref 0.44–1.00)
GFR, Estimated: 38 mL/min — ABNORMAL LOW (ref >60)
Glucose, Bld: 250 mg/dL — ABNORMAL HIGH (ref 70–99)
Potassium: 3.8 mmol/L (ref 3.5–5.1)
Sodium: 136 mmol/L (ref 135–145)
Total Bilirubin: 1.6 mg/dL — ABNORMAL HIGH (ref 0.3–1.2)
Total Protein: 6.5 g/dL (ref 6.5–8.1)

## 2022-04-16 LAB — CBC WITH DIFFERENTIAL/PLATELET
Abs Immature Granulocytes: 0.02 10*3/uL (ref 0.00–0.07)
Basophils Absolute: 0 10*3/uL (ref 0.0–0.1)
Basophils Relative: 1 %
Eosinophils Absolute: 0 10*3/uL (ref 0.0–0.5)
Eosinophils Relative: 1 %
HCT: 36.2 % (ref 36.0–46.0)
Hemoglobin: 10.9 g/dL — ABNORMAL LOW (ref 12.0–15.0)
Immature Granulocytes: 1 %
Lymphocytes Relative: 23 %
Lymphs Abs: 1 10*3/uL (ref 0.7–4.0)
MCH: 26.4 pg (ref 26.0–34.0)
MCHC: 30.1 g/dL (ref 30.0–36.0)
MCV: 87.7 fL (ref 80.0–100.0)
Monocytes Absolute: 0.4 10*3/uL (ref 0.1–1.0)
Monocytes Relative: 10 %
Neutro Abs: 2.8 10*3/uL (ref 1.7–7.7)
Neutrophils Relative %: 64 %
Platelets: 113 10*3/uL — ABNORMAL LOW (ref 150–400)
RBC: 4.13 MIL/uL (ref 3.87–5.11)
RDW: 15.6 % — ABNORMAL HIGH (ref 11.5–15.5)
WBC: 4.2 10*3/uL (ref 4.0–10.5)
nRBC: 0 % (ref 0.0–0.2)

## 2022-04-16 LAB — GLUCOSE, CAPILLARY
Glucose-Capillary: 264 mg/dL — ABNORMAL HIGH (ref 70–99)
Glucose-Capillary: 249 mg/dL — ABNORMAL HIGH (ref 70–99)
Glucose-Capillary: 230 mg/dL — ABNORMAL HIGH (ref 70–99)
Glucose-Capillary: 225 mg/dL — ABNORMAL HIGH (ref 70–99)

## 2022-04-16 LAB — MAGNESIUM: Magnesium: 1.9 mg/dL (ref 1.7–2.4)

## 2022-04-16 LAB — BRAIN NATRIURETIC PEPTIDE: B Natriuretic Peptide: 765 pg/mL — ABNORMAL HIGH (ref 0.0–100.0)

## 2022-04-16 LAB — D-DIMER, QUANTITATIVE: D-Dimer, Quant: 0.55 ug/mL-FEU — ABNORMAL HIGH (ref 0.00–0.50)

## 2022-04-16 LAB — C-REACTIVE PROTEIN: CRP: 3.5 mg/dL — ABNORMAL HIGH (ref <1.0)

## 2022-04-16 LAB — FERRITIN: Ferritin: 278 ng/mL (ref 11–307)

## 2022-04-16 LAB — PHOSPHORUS: Phosphorus: 3.1 mg/dL (ref 2.5–4.6)

## 2022-04-16 MED ORDER — PANCRELIPASE (LIP-PROT-AMYL) 12000-38000 UNITS PO CPEP
24000.0000 [IU] | ORAL_CAPSULE | Freq: Three times a day (TID) | ORAL | Status: DC
  Administered 2022-04-16 – 2022-04-18 (×5): 24000 [IU] via ORAL
  Filled 2022-04-16 (×5): qty 2

## 2022-04-16 MED ORDER — TRIAMCINOLONE ACETONIDE 0.1 % EX CREA
TOPICAL_CREAM | Freq: Two times a day (BID) | CUTANEOUS | Status: DC
  Filled 2022-04-16: qty 15

## 2022-04-16 MED ORDER — PANCRELIPASE (LIP-PROT-AMYL) 12000-38000 UNITS PO CPEP
12000.0000 [IU] | ORAL_CAPSULE | ORAL | Status: DC
  Administered 2022-04-16: 12000 [IU] via ORAL
  Filled 2022-04-16: qty 1

## 2022-04-16 MED ORDER — FAMOTIDINE 20 MG PO TABS
20.0000 mg | ORAL_TABLET | Freq: Every day | ORAL | Status: DC
  Administered 2022-04-16 – 2022-04-18 (×3): 20 mg via ORAL
  Filled 2022-04-16 (×2): qty 1

## 2022-04-16 MED ORDER — INSULIN GLARGINE-YFGN 100 UNIT/ML ~~LOC~~ SOLN
10.0000 [IU] | Freq: Every day | SUBCUTANEOUS | Status: DC
  Administered 2022-04-16: 10 [IU] via SUBCUTANEOUS
  Filled 2022-04-16 (×3): qty 0.1

## 2022-04-16 MED ORDER — INSULIN ASPART 100 UNIT/ML IJ SOLN: 3.0000 [IU] | Freq: Three times a day (TID) | INTRAMUSCULAR | Status: DC

## 2022-04-16 MED ORDER — ASPIRIN 81 MG PO CHEW
81.0000 mg | CHEWABLE_TABLET | Freq: Every day | ORAL | Status: DC
  Administered 2022-04-17 – 2022-04-18 (×2): 81 mg via ORAL
  Filled 2022-04-16 (×2): qty 1

## 2022-04-16 NOTE — Progress Notes (Signed)
PROGRESS NOTE   Misty Edwards  KGU:[id number] DOB: 1926-12-29 DOA: 04/15/2022 PCP: Susy Frizzle, MD   Chief Complaint  Patient presents with   Pulmonary Edema   Level of care: Telemetry  Brief Admission History:  89 y.o. female with medical history significant of chronic diastolic heart failure, A-fib, CAD, diabetes, hyperlipidemia, hypertension presents to the hospital with 1 week history of shortness of breath as well as cough.  She saw her primary care physician on 1/25, at which time her Lasix was increased.  She continued to exhibit shortness of breath and saw her PCP again 1/30 in the morning.  At that time, patient was seen to the emergency department due to failure of outpatient diuresis, fluid overload, COVID.  Patient's grandson is at bedside who provides most of the history.  He states that patient took a home COVID test yesterday which turned out positive.  Patient has not had any fevers but admits to chills.  Normally uses 2 L nasal cannula O2 as needed, but has been using it more recently.  Denies any nausea, vomiting, diarrhea or abdominal pain.  Has poor appetite.   ED Course: Labs show potassium 3.3, creatinine 1.49, BNP 882, troponin 23.  COVID-19 was positive.  Chest x-ray revealed small left greater than right pleural effusion unchanged from previous. Improved aeration bilateral lower lungs.   Assessment and Plan:  Acute on chronic diastolic CHF exacerbation  -Followed by Dr. Harl Bowie outpatient  -BNP 882 -Hold home oral lasix and metolazone while on IV therapy -IV lasix 40 mg BID  -Lopressor 50 mg BID  -Strict I/Os, daily weight  -Consult cardiology    COVID-19  -Tested positive 1/29 via home test -Her symptoms began > 5 days, supportive care recommended at this time per new treatment algorithm. She is not requiring above her baseline O2 needs  -Supportive treatments as ordered, encourage IS, encourage prone positioning, encourage mobilization    Chronic  respiratory failure -Uses 2L O2 prn   A Fib -Amiodarone, lopressor, eliquis   HTN -Norvasc, lopressor    Demand ischemia -Trop 23    CKD stage 3a -Baseline Cr 1-1.3    Controlled DM type 2 with steroid-induced hyperglycemia -A1c 6 -SSI  CBG (last 3)  Recent Labs    04/16/22 0759 04/16/22 1317 04/16/22 1646  GLUCAP 264* 249* 230*    Hypokalemia  -Replace   HLD -Crestor     DVT prophylaxis: apixaban Code Status: full  Family Communication: GS 1/31 Disposition: Status is: Inpatient Remains inpatient appropriate because: IV lasix for diuresis    Consultants:   Procedures:   Antimicrobials:    Subjective: Pt reports that her SOB is a little better this morning.   Objective: Vitals:   04/16/22 0924 04/16/22 1100 04/16/22 1110 04/16/22 1316  BP: (!) 147/89   (!) 123/41  Pulse: 78   79  Resp:    17  Temp:    98.1 F (36.7 C)  TempSrc:      SpO2: 96% (!) 84% 94% 100%  Weight:      Height:        Intake/Output Summary (Last 24 hours) at 04/16/2022 1704 Last data filed at 04/16/2022 6283 Gross per 24 hour  Intake 240 ml  Output 1350 ml  Net -1110 ml   Filed Weights   04/15/22 1041 04/15/22 1649 04/16/22 0351  Weight: 68 kg 65.5 kg 63.9 kg   Examination:  General exam: Appears calm and comfortable  Respiratory system: shallow BS  bilateral.  Cardiovascular system: normal S1 & S2 heard. No JVD, murmurs, rubs, gallops or clicks. No pedal edema. Gastrointestinal system: Abdomen is nondistended, soft and nontender. No organomegaly or masses felt. Normal bowel sounds heard. Central nervous system: Alert and oriented. No focal neurological deficits. Extremities: Symmetric 5 x 5 power. Skin: No rashes, lesions or ulcers. Psychiatry: Judgement and insight appear normal. Mood & affect appropriate.   Data Reviewed: I have personally reviewed following labs and imaging studies  CBC: Recent Labs  Lab 04/15/22 1131 04/16/22 0440  WBC 5.3 4.2  NEUTROABS  3.4 2.8  HGB 10.7* 10.9*  HCT 35.0* 36.2  MCV 86.8 87.7  PLT 121* 113*    Basic Metabolic Panel: Recent Labs  Lab 04/10/22 1504 04/15/22 1131 04/16/22 0440  NA 139 137 136  K 4.4 3.3* 3.8  CL 98 95* 92*  CO2 35* 31 33*  GLUCOSE 92 113* 250*  BUN 28* 35* 30*  CREATININE 1.53* 1.49* 1.31*  CALCIUM 9.1 8.5* 8.7*  MG  --   --  1.9  PHOS  --   --  3.1    CBG: Recent Labs  Lab 04/15/22 1654 04/15/22 1959 04/16/22 0759 04/16/22 1317 04/16/22 1646  GLUCAP 138* 256* 264* 249* 230*    Recent Results (from the past 240 hour(s))  Resp panel by RT-PCR (RSV, Flu A&B, Covid) Anterior Nasal Swab     Status: Abnormal   Collection Time: 04/15/22 11:05 AM   Specimen: Anterior Nasal Swab  Result Value Ref Range Status   SARS Coronavirus 2 by RT PCR POSITIVE (A) NEGATIVE Final    Comment: (NOTE) SARS-CoV-2 target nucleic acids are DETECTED.  The SARS-CoV-2 RNA is generally detectable in upper respiratory specimens during the acute phase of infection. Positive results are indicative of the presence of the identified virus, but do not rule out bacterial infection or co-infection with other pathogens not detected by the test. Clinical correlation with patient history and other diagnostic information is necessary to determine patient infection status. The expected result is Negative.  Fact Sheet for Patients: EntrepreneurPulse.com.au  Fact Sheet for Healthcare Providers: IncredibleEmployment.be  This test is not yet approved or cleared by the [place] FDA and  has been authorized for detection and/or diagnosis of SARS-CoV-2 by FDA under an Emergency Use Authorization (EUA).  This EUA will remain in effect (meaning this test can be used) for the duration of  the COVID-19 declaration under Section 564(b)(1) of the A ct, 21 U.S.C. section 360bbb-3(b)(1), unless the authorization is terminated or revoked sooner.     Influenza A by PCR  NEGATIVE NEGATIVE Final   Influenza B by PCR NEGATIVE NEGATIVE Final    Comment: (NOTE) The Xpert Xpress SARS-CoV-2/FLU/RSV plus assay is intended as an aid in the diagnosis of influenza from Nasopharyngeal swab specimens and should not be used as a sole basis for treatment. Nasal washings and aspirates are unacceptable for Xpert Xpress SARS-CoV-2/FLU/RSV testing.  Fact Sheet for Patients: EntrepreneurPulse.com.au  Fact Sheet for Healthcare Providers: IncredibleEmployment.be  This test is not yet approved or cleared by the [place] FDA and has been authorized for detection and/or diagnosis of SARS-CoV-2 by FDA under an Emergency Use Authorization (EUA). This EUA will remain in effect (meaning this test can be used) for the duration of the COVID-19 declaration under Section 564(b)(1) of the Act, 21 U.S.C. section 360bbb-3(b)(1), unless the authorization is terminated or revoked.     Resp Syncytial Virus by PCR NEGATIVE NEGATIVE Final  Comment: (NOTE) Fact Sheet for Patients: EntrepreneurPulse.com.au  Fact Sheet for Healthcare Providers: IncredibleEmployment.be  This test is not yet approved or cleared by the [place] FDA and has been authorized for detection and/or diagnosis of SARS-CoV-2 by FDA under an Emergency Use Authorization (EUA). This EUA will remain in effect (meaning this test can be used) for the duration of the COVID-19 declaration under Section 564(b)(1) of the Act, 21 U.S.C. section 360bbb-3(b)(1), unless the authorization is terminated or revoked.  Performed at [facility], [address], [place], [place] [address]      Radiology Studies: DG Chest Portable 1 View  Result Date: 04/15/2022 CLINICAL DATA:  Shortness of breath with chest congestion. EXAM: PORTABLE CHEST 1 VIEW COMPARISON:  AP chest 04/10/2022 FINDINGS: Status post median sternotomy and CABG. Cardiac silhouette  is again at the upper limits of normal size. Mediastinal contours are within normal limits. Small left-greater-than-right pleural effusions are unchanged to mildly improved from prior. Improved aeration of the bilateral lower lungs, now with mild interstitial thickening but resolution of the prior heterogeneous airspace opacities. No pneumothorax is seen. Numerous surgical clips again overlie the right axilla and right chest wall. No acute skeletal abnormality. IMPRESSION: 1. Small left-greater-than-right pleural effusions are unchanged to mildly improved from prior. 2. Improved aeration of the bilateral lower lungs. Electronically Signed   By: Yvonne Kendall M.D.   On: 04/15/2022 11:56    Scheduled Meds:  amiodarone  200 mg Oral Daily   amLODipine  5 mg Oral Daily   vitamin C  500 mg Oral Daily   [START ON 04/17/2022] aspirin  81 mg Oral Daily   famotidine  20 mg Oral Daily   ferrous sulfate  325 mg Oral Q breakfast   FLUoxetine  20 mg Oral Daily   furosemide  40 mg Intravenous BID   insulin aspart  0-9 Units Subcutaneous TID WC   lipase/protease/amylase  12,000 Units Oral With snacks   lipase/protease/amylase  24,000 Units Oral TID WC   metoprolol tartrate  50 mg Oral BID   pantoprazole  40 mg Oral Daily   rosuvastatin  5 mg Oral Daily   sodium chloride flush  3 mL Intravenous Q12H   triamcinolone cream   Topical BID   zinc sulfate  220 mg Oral Daily   Continuous Infusions:   LOS: 1 day   Time spent: 36 mins  Lorra Freeman Wynetta Emery, MD How to contact the [facility] Attending or Consulting provider [place] or covering provider during after hours [place], for this patient?  Check the care team in [facility]) and look for a) attending/consulting [facility] provider listed and b) the [facility] team listed Log into [email or web address] and use [place]'s universal password to access. If you do not have the password, please contact the hospital operator. Locate the [facility] provider you are looking for under [facility] and page  to a number that you can be directly reached. If you still have difficulty reaching the provider, please page the [facility] (Director on Call) for the Hospitalists listed on amion for assistance.  04/16/2022, 5:04 PM

## 2022-04-16 NOTE — TOC Initial Note (Signed)
Transition of Care [facility]) - Initial/Assessment Note    Patient Details  Name: Misty Edwards MRN: [id number] Date of Birth: [birth year]  Transition of Care [facility]) CM/SW Contact:    Boneta Lucks, RN Phone Number: 04/16/2022, 11:40 AM  Clinical Narrative:      Patient admitted with acute on chronic diastolic CHF, Patient has COVID. CM spoke with her Misty Edwards, She raised him , he lives with her and cares for her. Patient is high risk for readmission. Last admission we set up home oxygen with [facility]. She on on 2L at baseline. She completed home health with [facility] , they will accept back, and Grandson wants home health. TOC to follow.          Expected Discharge Plan: [place] Barriers to Discharge: Continued Medical Work up   Patient Goals and CMS Choice Patient states their goals for this hospitalization and ongoing recovery are:: to go home. CMS Medicare.gov Compare Post Acute Care list provided to:: Patient Represenative (must comment) Choice offered to / list presented to : Adult Children     Expected Discharge Plan and Services        Prior Living Arrangements/Services   Lives with:: Other (Comment) (Grandson) Patient language and need for interpreter reviewed:: Yes        Need for Family Participation in Patient Care: Yes (Comment) Care giver support system in place?: Yes (comment) Current home services: DME Criminal Activity/Legal Involvement Pertinent to Current Situation/Hospitalization: No - Comment as needed  Activities of Daily Living Home Assistive Devices/Equipment: CBG Meter, Walker (specify type), Oxygen, Eyeglasses, Dentures (specify type) ADL Screening (condition at time of admission) Patient's cognitive ability adequate to safely complete daily activities?: Yes Is the patient deaf or have difficulty hearing?: Yes Does the patient have difficulty seeing, even when wearing glasses/contacts?: Yes Does the patient have difficulty  concentrating, remembering, or making decisions?: No Patient able to express need for assistance with ADLs?: Yes Does the patient have difficulty dressing or bathing?: No Independently performs ADLs?: Yes (appropriate for developmental age) Does the patient have difficulty walking or climbing stairs?: Yes Weakness of Legs: Both Weakness of Arms/Hands: None  Permission Sought/Granted    Emotional Assessment      Orientation: : Oriented to Self, Oriented to Place, Oriented to Situation Alcohol / Substance Use: Not Applicable Psych Involvement: No (comment)  Admission diagnosis:  Acute on chronic diastolic CHF (congestive heart failure) ([place]) [I50.33] Acute on chronic congestive heart failure, unspecified heart failure type ([place]) [I50.9] COVID-19 [U07.1] Patient Active Problem List   Diagnosis Date Noted   COVID-19 virus infection 04/15/2022   Hypomagnesemia 03/13/2022   Acute on chronic congestive heart failure ([place]) 03/11/2022   Coronary artery disease involving coronary bypass graft of native heart without angina pectoris 03/04/2022   Nonrheumatic mitral valve regurgitation 03/03/2022   Acute exacerbation of CHF (congestive heart failure) ([place]) 03/01/2022   Atrial fibrillation ([place]) 03/01/2022   Exocrine pancreatic insufficiency 10/04/2021   Heartburn 10/04/2021   Acute pulmonary edema ([place])    Acute respiratory failure ([place]) 09/23/2019   Palliative care by specialist    Advanced care planning/counseling discussion    Goals of care, counseling/discussion    Disorientation    AKI (acute kidney injury) ([place]) 09/13/2018   Hyponatremia 09/13/2018   Herpes zoster ophthalmicus 09/03/2018   Right acetabular fracture ([place]) 09/03/2018   Syncope and collapse 09/03/2018   Protein-calorie malnutrition, severe 09/03/2018   Vaginal atrophy 04/29/2018   Angiokeratoma 04/29/2018   PMB (  postmenopausal bleeding) 04/29/2018   Acute on chronic diastolic CHF (congestive heart failure) ([place])  10/23/2016   Acute respiratory failure with hypoxia ([place]) 10/23/2016   Nephrolithiasis 09/27/2016   Hydronephrosis with renal and ureteral calculus obstruction 09/27/2016   Hydronephrosis 09/27/2016   Heme positive stool 09/05/2016   Dyslipidemia 03/07/2015   Type 2 diabetes mellitus with vascular disease ([place]) 03/07/2015   Anemia 09/26/2014   Lesion of lip 09/25/2014   S/P CABG x 3 08/25/2014   NSTEMI (non-ST elevated myocardial infarction) ([place]) 08/24/2014   Acute coronary syndrome (HCC)    IBS (irritable bowel syndrome) 08/26/2012   Thrombocytopenia ([place]) 02/02/2012   Epigastric pain 02/02/2012   Gastritis 02/02/2012   Abdominal pain 12/17/2011   Normocytic anemia 12/17/2011   History of colon polyps 12/17/2011   Constipation 12/17/2011   Family history of colon cancer 08/22/2011   LLQ pain 08/21/2011   Chronic pancreatitis ([place]) 41/58/3094   HELICOBACTER PYLORI INFECTION 01/26/2008   ADENOCARCINOMA, BREAST 01/26/2008   Essential hypertension 01/26/2008   External hemorrhoids 01/26/2008   GERD 01/26/2008   PSEUDOCYST, PANCREAS 01/26/2008   GALLSTONE PANCREATITIS 01/26/2008   GI BLEEDING 01/26/2008   UTI (urinary tract infection) 01/26/2008   NAUSEA 01/26/2008   VOMITING 01/26/2008   DIARRHEA 01/26/2008   PCP:  Susy Frizzle, MD Pharmacy:   [facility] - [place], [place] - [address] [place] [address] Phone: [phone] Fax: [phone]  [facility] [address] [place] - [place] [address] [place] [address] [place] [address] Phone: [phone] Fax: [phone]     Social Determinants of Health (SDOH) Social History: SDOH Screenings   Food Insecurity: No Food Insecurity (04/15/2022)  Housing: Low Risk  (04/15/2022)  Transportation Needs: No Transportation Needs (04/15/2022)  Utilities: Not At Risk (04/15/2022)  Depression (PHQ2-9): Low Risk  (04/01/2022)  Tobacco Use: Low Risk  (04/15/2022)    SDOH Interventions:   Readmission Risk Interventions    04/16/2022   11:39 AM 03/05/2022    1:14 PM  Readmission Risk Prevention Plan  Transportation Screening Complete Complete  PCP or Specialist Appt within 5-7 Days  Complete  Home Care Screening  Complete  Medication Review (RN CM)  Complete  Medication Review ([place]) Complete   PCP or Specialist appointment within 3-5 days of discharge Not Complete   HRI or [place] Complete   SW Recovery Care/Counseling Consult Complete   Palliative Care Screening Not [place] Not Applicable

## 2022-04-16 NOTE — Hospital Course (Signed)
89 y.o. female with medical history significant of chronic diastolic heart failure, A-fib, CAD, diabetes, hyperlipidemia, hypertension presents to the hospital with 1 week history of shortness of breath as well as cough.  She saw her primary care physician on 1/25, at which time her Lasix was increased.  She continued to exhibit shortness of breath and saw her PCP again 1/30 in the morning.  At that time, patient was seen to the emergency department due to failure of outpatient diuresis, fluid overload, COVID.  Patient's grandson is at bedside who provides most of the history.  He states that patient took a home COVID test yesterday which turned out positive.  Patient has not had any fevers but admits to chills.  Normally uses 2 L nasal cannula O2 as needed, but has been using it more recently.  Denies any nausea, vomiting, diarrhea or abdominal pain.  Has poor appetite.   ED Course: Labs show potassium 3.3, creatinine 1.49, BNP 882, troponin 23.  COVID-19 was positive.  Chest x-ray revealed small left greater than right pleural effusion unchanged from previous. Improved aeration bilateral lower lungs.

## 2022-04-16 NOTE — Progress Notes (Signed)
Pt has been able to rest well in bed.  She continues on O2 at 2L.  Intermittent cough with white sputum reported.  LS diminished.   Trace NP LE edema.  Able to transfer to/from [facility] with minimal assist of 1.  No pain.  AFIB on monitor.

## 2022-04-16 NOTE — Consult Note (Signed)
CARDIOLOGY CONSULT NOTE    Patient ID: KESTREL MIS; [id number]; 1926/05/29   Admit date: 04/15/2022 Date of Consult: 04/16/2022  Primary Care Provider: Susy Frizzle, MD Primary Cardiologist: Dr Carlyle Dolly Primary Electrophysiologist:  None   Patient Profile:   Misty Edwards is a 89 y.o. female who is being seen today for the evaluation of heart failure exacerbation at the request of Dr Wynetta Emery.  History of Present Illness:   Misty Edwards is a 89 y/o F known to have CAD s/p emergent CABG in 2016 (LIMA to LAD, SVG to OM and SVG to PDA), long standing persistent Afib, chronic diastolic heart failure, chronic hypoxic respiratory failure on home oxygen, HTN, HLD, DM 2 presented to the ER with 1 week history of DOE and cough. She is currently admitted to the hospitalist team for the management of acute on chronic hypoxic respiratory failure secondary to COVID-19 infection and acute on chronic diastolic heart failure exacerbation on IV Lasix 40 mg twice daily.  Patient is seen and examined at the bedside. She reports improvement in her symptoms of SOB since yesterday, denies any chest pain, leg swelling, dizziness, lightheadedness and syncope.  Past Medical History:  Diagnosis Date   Acute biliary pancreatitis 07/2002   thia was in 07/2004:she still has pseudocyst in tail of pancreas measuring 54 x 33 mm    Adrenal adenoma    bilateral   Anxiety    CAD (coronary artery disease)    a. s/p CABG in 08/2014 with LIMA-LAD, SVG-OM and SVG-PDA   Chronic pancreatitis ([place])    Detached retina    Diabetes mellitus ([place])    Diverticulosis    Heartburn    History of carcinoma in situ of breast 1988   Hyperlipidemia    Hypertension    IBS (irritable bowel syndrome)    Legally blind    Osteoarthritis    Osteopenia    Upper GI bleed September2004   secondary to gastritis    Past Surgical History:  Procedure Laterality Date   CARDIAC CATHETERIZATION N/A 08/24/2014   Procedure:  Left Heart Cath and Coronary Angiography;  Surgeon: Leonie Man, MD;  Location: [facility];  Service: Cardiovascular;  Laterality: N/A;   COLONOSCOPY  08/15/05   few tiny diverticula at sigmoid colon/external hemorrhoids but no polyps   COLONOSCOPY  01/08/2012   MOQ:HUTMLYY diverticulosis. Next colonoscopy in 12/2016   CORONARY ARTERY BYPASS GRAFT N/A 08/24/2014   Procedure: CORONARY ARTERY BYPASS GRAFTING (CABG) x three, using left internal mammary artery and right leg greater saphenous vein harvested endoscopically;  Surgeon: Ivin Poot, MD;  Location: [facility];  Service: Open Heart Surgery;  Laterality: N/A;   ECTOPIC PREGNANCY SURGERY  1950's   ERCP with sphincterotomy  07/2002   ESOPHAGOGASTRODUODENOSCOPY      Gastritis of body.  Otherwise normal   ESOPHAGOGASTRODUODENOSCOPY  01/08/2012   RMR: Few scattered gastric erosions of uncertain significance-status post biopsy. Minimal chronic inflammation, no H.pylori   LAPAROSCOPIC CHOLECYSTECTOMY     MASTECTOMY Right 1980   PANCREATIC PSEUDOCYST DRAINAGE  [id number]   drained percutaneously    right mastectomy     tacking up of her bladder     TEE WITHOUT CARDIOVERSION N/A 08/24/2014   Procedure: TRANSESOPHAGEAL ECHOCARDIOGRAM (TEE);  Surgeon: Ivin Poot, MD;  Location: [facility];  Service: Open Heart Surgery;  Laterality: N/A;       Inpatient Medications: Scheduled Meds:  amiodarone  200 mg Oral Daily  amLODipine  5 mg Oral Daily   apixaban  5 mg Oral BID   vitamin C  500 mg Oral Daily   famotidine  20 mg Oral Daily   ferrous sulfate  325 mg Oral Q breakfast   FLUoxetine  20 mg Oral Daily   furosemide  40 mg Intravenous BID   insulin aspart  0-9 Units Subcutaneous TID WC   lipase/protease/amylase  12,000 Units Oral With snacks   lipase/protease/amylase  24,000 Units Oral TID WC   metoprolol tartrate  50 mg Oral BID   pantoprazole  40 mg Oral Daily   rosuvastatin  5 mg Oral Daily   sodium chloride flush  3 mL  Intravenous Q12H   triamcinolone cream   Topical BID   zinc sulfate  220 mg Oral Daily   Continuous Infusions:  PRN Meds: acetaminophen **OR** acetaminophen, chlorpheniramine-HYDROcodone, guaiFENesin-dextromethorphan, LORazepam, ondansetron **OR** ondansetron (ZOFRAN) IV  Allergies:    Allergies  Allergen Reactions   Calcium-Containing Compounds Nausea Only   Morphine And Related Other (See Comments)    Hallucination   Raloxifene Itching    Evista- Face and eyes burning   Vitamin D Analogs Nausea Only    Social History:   Social History   Socioeconomic History   Marital status: Widowed    Spouse name: Not on file   Number of children: 5   Years of education: Not on file   Highest education level: Not on file  Occupational History   Occupation: homemaker  Tobacco Use   Smoking status: Never   Smokeless tobacco: Never  Vaping Use   Vaping Use: Never used  Substance and Sexual Activity   Alcohol use: No    Alcohol/week: 0.0 standard drinks of alcohol   Drug use: No   Sexual activity: Not Currently    Birth control/protection: Post-menopausal  Other Topics Concern   Not on file  Social History Narrative   Lives in [place].   Social Determinants of Health   Financial Resource Strain: Not on file  Food Insecurity: No Food Insecurity (04/15/2022)   Hunger Vital Sign    Worried About Running Out of Food in the Last Year: Never true    Ran Out of Food in the Last Year: Never true  Transportation Needs: No Transportation Needs (04/15/2022)   PRAPARE - Hydrologist (Medical): No    Lack of Transportation (Non-Medical): No  Physical Activity: Not on file  Stress: Not on file  Social Connections: Not on file  Intimate Partner Violence: Not At Risk (04/15/2022)   Humiliation, Afraid, Rape, and Kick questionnaire    Fear of Current or Ex-Partner: No    Emotionally Abused: No    Physically Abused: No    Sexually Abused: No    Family  History:    Family History  Problem Relation Age of Onset   Ovarian cancer Mother        passed   Colon cancer Father 39       passed away in his 60's   Colon cancer Brother 69       surgery inhis 37's doing well now   Heart attack Brother        passed away age 60   Pancreatitis Brother    Kidney disease Daughter    Leukemia Son      ROS:  Please see the history of present illness.  ROS  All other ROS reviewed and negative.     Physical Exam/Data:  Vitals:   04/16/22 0351 04/16/22 0924 04/16/22 1100 04/16/22 1110  BP:  (!) 147/89    Pulse:  78    Resp:      Temp:      TempSrc:      SpO2:  96% (!) 84% 94%  Weight: 63.9 kg     Height:        Intake/Output Summary (Last 24 hours) at 04/16/2022 1205 Last data filed at 04/16/2022 8828 Gross per 24 hour  Intake 240 ml  Output 1350 ml  Net -1110 ml   Filed Weights   04/15/22 1041 04/15/22 1649 04/16/22 0351  Weight: 68 kg 65.5 kg 63.9 kg   Body mass index is 24.95 kg/m.  General:  Well nourished, well developed, in no acute distress HEENT: normal Lymph: no adenopathy Neck: no JVD Endocrine:  No thryomegaly Vascular: No carotid bruits; FA pulses 2+ bilaterally without bruits  Cardiac:  normal S1, S2; RRR; no murmur  Lungs:  clear to auscultation bilaterally, no wheezing, rhonchi or rales  Abd: soft, nontender, no hepatomegaly  Ext: no edema Musculoskeletal:  No deformities, BUE and BLE strength normal and equal Skin: warm and dry  Neuro:  CNs 2-12 intact, no focal abnormalities noted Psych:  Normal affect   EKG:  The EKG was personally reviewed and demonstrates:   Telemetry:  Telemetry was personally reviewed and demonstrates:    Relevant CV Studies:   Laboratory Data:  Chemistry Recent Labs  Lab 04/10/22 1504 04/15/22 1131 04/16/22 0440  NA 139 137 136  K 4.4 3.3* 3.8  CL 98 95* 92*  CO2 35* 31 33*  GLUCOSE 92 113* 250*  BUN 28* 35* 30*  CREATININE 1.53* 1.49* 1.31*  CALCIUM 9.1 8.5* 8.7*   GFRNONAA  --  33* 38*  ANIONGAP  --  11 11    Recent Labs  Lab 04/16/22 0440  PROT 6.5  ALBUMIN 3.4*  AST 51*  ALT 78*  ALKPHOS 61  BILITOT 1.6*   Hematology Recent Labs  Lab 04/15/22 1131 04/16/22 0440  WBC 5.3 4.2  RBC 4.03 4.13  HGB 10.7* 10.9*  HCT 35.0* 36.2  MCV 86.8 87.7  MCH 26.6 26.4  MCHC 30.6 30.1  RDW 15.9* 15.6*  PLT 121* 113*   Cardiac EnzymesNo results for input(s): "TROPONINI" in the last 168 hours. No results for input(s): "TROPIPOC" in the last 168 hours.  BNP Recent Labs  Lab 04/10/22 1504 04/15/22 1131 04/16/22 0439  BNP 493* 882.0* 765.0*    DDimer  Recent Labs  Lab 04/16/22 0440  DDIMER 0.55*    Radiology/Studies:  DG Chest Portable 1 View  Result Date: 04/15/2022 CLINICAL DATA:  Shortness of breath with chest congestion. EXAM: PORTABLE CHEST 1 VIEW COMPARISON:  AP chest 04/10/2022 FINDINGS: Status post median sternotomy and CABG. Cardiac silhouette is again at the upper limits of normal size. Mediastinal contours are within normal limits. Small left-greater-than-right pleural effusions are unchanged to mildly improved from prior. Improved aeration of the bilateral lower lungs, now with mild interstitial thickening but resolution of the prior heterogeneous airspace opacities. No pneumothorax is seen. Numerous surgical clips again overlie the right axilla and right chest wall. No acute skeletal abnormality. IMPRESSION: 1. Small left-greater-than-right pleural effusions are unchanged to mildly improved from prior. 2. Improved aeration of the bilateral lower lungs. Electronically Signed   By: Yvonne Kendall M.D.   On: 04/15/2022 11:56    Assessment and Plan:   Patient is a 89 y/o F known  to have CAD s/p emergent CABG in 2016 (LIMA to LAD, SVG to OM and SVG to PDA), long standing persistent Afib, chronic diastolic heart failure, chronic hypoxic respiratory failure on home oxygen, HTN, HLD, DM 2 who is currently admitted to hospitalist team for  management of acute on chronic hypoxic respiratory failure secondary to COVID-19 infection and acute on chronic diastolic heart failure exacerbation.  # Acute on chronic hypoxic respiratory failure secondary to COVID-19 infection -Management per primary team  # Acute on chronic diastolic heart failure exacerbation -Continue IV Lasix 40 mg twice daily  # Longstanding persistent atrial fibrillation, rate controlled -Continue metoprolol tartarate 50 mg twice daily -Continue amiodarone 200 mg once daily -Patient had multiple falls in the past. Hold Eliquis.  # CAD s/p emergent CABG in 2016 (LIMA to LAD, SVG to OM and SVG to PDA), currently angina free -Start aspirin 81 mg once daily -Continue rosuvastatin 5 mg nightly  # HLD -Continue rosuvastatin 5 mg nightly.  Goal LDL less than 70.  # Valvular heart disease (moderate MR and trivial AI) -Outpatient management  I have spent a total of 60 minutes with patient reviewing chart , telemetry, EKGs, labs and examining patient as well as establishing an assessment and plan that was discussed with the patient.  > 50% of time was spent in direct patient care.      For questions or updates, please contact [place] Please consult [email or web address] for contact info under Cardiology/STEMI.   Signed, Vangie Bicker, MD 04/16/2022 12:05 PM

## 2022-04-16 NOTE — Progress Notes (Signed)
PT has been alert and oriented all shift and pleasant. Pt has taken medication whole in apple sauce, Pt has been ambulated to and from the bathroom several times this shift with a walker and is a one assist. Pt has also ambulated to the chair and has sat up for a few hours to eat. Pt has denied any pain or discomfort and has not requested any PRNs.

## 2022-04-17 ENCOUNTER — Inpatient Hospital Stay (HOSPITAL_COMMUNITY): Payer: PPO

## 2022-04-17 DIAGNOSIS — I5033 Acute on chronic diastolic (congestive) heart failure: Secondary | ICD-10-CM | POA: Diagnosis not present

## 2022-04-17 DIAGNOSIS — Z951 Presence of aortocoronary bypass graft: Secondary | ICD-10-CM | POA: Diagnosis not present

## 2022-04-17 DIAGNOSIS — U071 COVID-19: Secondary | ICD-10-CM | POA: Diagnosis not present

## 2022-04-17 DIAGNOSIS — I4891 Unspecified atrial fibrillation: Secondary | ICD-10-CM | POA: Diagnosis not present

## 2022-04-17 DIAGNOSIS — J810 Acute pulmonary edema: Secondary | ICD-10-CM | POA: Diagnosis not present

## 2022-04-17 DIAGNOSIS — E7849 Other hyperlipidemia: Secondary | ICD-10-CM | POA: Diagnosis not present

## 2022-04-17 DIAGNOSIS — J9621 Acute and chronic respiratory failure with hypoxia: Secondary | ICD-10-CM | POA: Diagnosis not present

## 2022-04-17 LAB — COMPREHENSIVE METABOLIC PANEL
ALT: 73 U/L — ABNORMAL HIGH (ref 0–44)
AST: 53 U/L — ABNORMAL HIGH (ref 15–41)
Albumin: 3.6 g/dL (ref 3.5–5.0)
Alkaline Phosphatase: 60 U/L (ref 38–126)
Anion gap: 14 (ref 5–15)
BUN: 30 mg/dL — ABNORMAL HIGH (ref 8–23)
CO2: 35 mmol/L — ABNORMAL HIGH (ref 22–32)
Calcium: 8.9 mg/dL (ref 8.9–10.3)
Chloride: 87 mmol/L — ABNORMAL LOW (ref 98–111)
Creatinine, Ser: 1.41 mg/dL — ABNORMAL HIGH (ref 0.44–1.00)
GFR, Estimated: 35 mL/min — ABNORMAL LOW (ref >60)
Glucose, Bld: 250 mg/dL — ABNORMAL HIGH (ref 70–99)
Potassium: 3 mmol/L — ABNORMAL LOW (ref 3.5–5.1)
Sodium: 136 mmol/L (ref 135–145)
Total Bilirubin: 1.9 mg/dL — ABNORMAL HIGH (ref 0.3–1.2)
Total Protein: 7 g/dL (ref 6.5–8.1)

## 2022-04-17 LAB — CBC WITH DIFFERENTIAL/PLATELET
Abs Immature Granulocytes: 0 10*3/uL (ref 0.00–0.07)
Basophils Absolute: 0 10*3/uL (ref 0.0–0.1)
Basophils Relative: 0 %
Eosinophils Absolute: 0 10*3/uL (ref 0.0–0.5)
Eosinophils Relative: 1 %
HCT: 36.9 % (ref 36.0–46.0)
Hemoglobin: 11.3 g/dL — ABNORMAL LOW (ref 12.0–15.0)
Immature Granulocytes: 0 %
Lymphocytes Relative: 28 %
Lymphs Abs: 0.9 10*3/uL (ref 0.7–4.0)
MCH: 26.2 pg (ref 26.0–34.0)
MCHC: 30.6 g/dL (ref 30.0–36.0)
MCV: 85.6 fL (ref 80.0–100.0)
Monocytes Absolute: 0.4 10*3/uL (ref 0.1–1.0)
Monocytes Relative: 13 %
Neutro Abs: 1.9 10*3/uL (ref 1.7–7.7)
Neutrophils Relative %: 58 %
Platelets: 132 10*3/uL — ABNORMAL LOW (ref 150–400)
RBC: 4.31 MIL/uL (ref 3.87–5.11)
RDW: 15.6 % — ABNORMAL HIGH (ref 11.5–15.5)
WBC: 3.2 10*3/uL — ABNORMAL LOW (ref 4.0–10.5)
nRBC: 0 % (ref 0.0–0.2)

## 2022-04-17 LAB — D-DIMER, QUANTITATIVE: D-Dimer, Quant: 0.65 ug/mL-FEU — ABNORMAL HIGH (ref 0.00–0.50)

## 2022-04-17 LAB — GLUCOSE, CAPILLARY
Glucose-Capillary: 249 mg/dL — ABNORMAL HIGH (ref 70–99)
Glucose-Capillary: 359 mg/dL — ABNORMAL HIGH (ref 70–99)
Glucose-Capillary: 250 mg/dL — ABNORMAL HIGH (ref 70–99)
Glucose-Capillary: 81 mg/dL (ref 70–99)

## 2022-04-17 LAB — C-REACTIVE PROTEIN: CRP: 2.3 mg/dL — ABNORMAL HIGH (ref <1.0)

## 2022-04-17 LAB — PHOSPHORUS: Phosphorus: 3.6 mg/dL (ref 2.5–4.6)

## 2022-04-17 LAB — FERRITIN: Ferritin: 344 ng/mL — ABNORMAL HIGH (ref 11–307)

## 2022-04-17 LAB — MAGNESIUM: Magnesium: 1.8 mg/dL (ref 1.7–2.4)

## 2022-04-17 LAB — BRAIN NATRIURETIC PEPTIDE: B Natriuretic Peptide: 512 pg/mL — ABNORMAL HIGH (ref 0.0–100.0)

## 2022-04-17 MED ORDER — INSULIN GLARGINE-YFGN 100 UNIT/ML ~~LOC~~ SOLN: 15.0000 [IU] | Freq: Every day | SUBCUTANEOUS | Status: DC

## 2022-04-17 MED ORDER — MAGNESIUM SULFATE 4 GM/100ML IV SOLN
4.0000 g | Freq: Once | INTRAVENOUS | Status: AC
  Administered 2022-04-17: 4 g via INTRAVENOUS
  Filled 2022-04-17: qty 100

## 2022-04-17 MED ORDER — INSULIN ASPART PROT & ASPART (70-30 MIX) 100 UNIT/ML ~~LOC~~ SUSP
20.0000 [IU] | Freq: Two times a day (BID) | SUBCUTANEOUS | Status: DC
  Administered 2022-04-17 – 2022-04-18 (×3): 20 [IU] via SUBCUTANEOUS
  Filled 2022-04-17: qty 10

## 2022-04-17 MED ORDER — INSULIN ASPART 100 UNIT/ML IJ SOLN: 5.0000 [IU] | Freq: Three times a day (TID) | INTRAMUSCULAR | Status: DC

## 2022-04-17 MED ORDER — POTASSIUM CHLORIDE CRYS ER 20 MEQ PO TBCR
40.0000 meq | EXTENDED_RELEASE_TABLET | Freq: Once | ORAL | Status: AC
  Administered 2022-04-17: 40 meq via ORAL
  Filled 2022-04-17: qty 2

## 2022-04-17 NOTE — TOC Progression Note (Signed)
Transition of Care [facility]) - Progression Note    Patient Details  Name: KESSIE CROSTON MRN: [id number] Date of Birth: [birth year]  [facility]) CM/SW Contact  Boneta Lucks, RN Phone Number: 04/17/2022, 11:14 AM  Clinical Narrative:   [name] wants HHPT. Referral given to [facility] with [facility], to restart services. MD aware to order.   Expected Discharge Plan: [place] Barriers to Discharge: Continued Medical Work up  Expected Discharge Plan and Services     Social Determinants of Health (SDOH) Interventions SDOH Screenings   Food Insecurity: No Food Insecurity (04/15/2022)  Housing: Low Risk  (04/15/2022)  Transportation Needs: No Transportation Needs (04/15/2022)  Utilities: Not At Risk (04/15/2022)  Depression (PHQ2-9): Low Risk  (04/01/2022)  Tobacco Use: Low Risk  (04/15/2022)    Readmission Risk Interventions    04/16/2022   11:39 AM 03/05/2022    1:14 PM  Readmission Risk Prevention Plan  Transportation Screening Complete Complete  PCP or Specialist Appt within 5-7 Days  Complete  Home Care Screening  Complete  Medication Review (RN CM)  Complete  Medication Review ([place]) Complete   PCP or Specialist appointment within 3-5 days of discharge Not Complete   HRI or [place] Complete   SW Recovery Care/Counseling Consult Complete   [place] Not Applicable

## 2022-04-17 NOTE — Progress Notes (Signed)
Progress Note  Patient Name: Misty Edwards Date of Encounter: 04/17/2022  Primary Cardiologist: Carlyle Dolly, MD  Subjective   No acute events overnight. Patient reports improvement in her symptoms of SOB. She reports having runny nose, feeling warm.  Inpatient Medications    Scheduled Meds:  amiodarone  200 mg Oral Daily   amLODipine  5 mg Oral Daily   vitamin C  500 mg Oral Daily   aspirin  81 mg Oral Daily   famotidine  20 mg Oral Daily   ferrous sulfate  325 mg Oral Q breakfast   FLUoxetine  20 mg Oral Daily   furosemide  40 mg Intravenous BID   insulin aspart  0-9 Units Subcutaneous TID WC   insulin aspart protamine- aspart  20 Units Subcutaneous BID WC   lipase/protease/amylase  12,000 Units Oral With snacks   lipase/protease/amylase  24,000 Units Oral TID WC   metoprolol tartrate  50 mg Oral BID   pantoprazole  40 mg Oral Daily   rosuvastatin  5 mg Oral Daily   sodium chloride flush  3 mL Intravenous Q12H   triamcinolone cream   Topical BID   zinc sulfate  220 mg Oral Daily   Continuous Infusions:  magnesium sulfate bolus IVPB 4 g (04/17/22 1102)   PRN Meds: acetaminophen **OR** acetaminophen, chlorpheniramine-HYDROcodone, guaiFENesin-dextromethorphan, LORazepam, ondansetron **OR** ondansetron (ZOFRAN) IV   Vital Signs    Vitals:   04/16/22 1316 04/16/22 2033 04/17/22 0328 04/17/22 0500  BP: (!) 123/41 137/64 (!) 131/48   Pulse: 79 77 71   Resp: '17 20 18   '$ Temp: 98.1 F (36.7 C) 97.8 F (36.6 C) 98 F (36.7 C)   TempSrc:   Oral   SpO2: 100% 99% 96%   Weight:    65.6 kg  Height:        Intake/Output Summary (Last 24 hours) at 04/17/2022 1123 Last data filed at 04/17/2022 0500 Gross per 24 hour  Intake --  Output 800 ml  Net -800 ml   Filed Weights   04/15/22 1649 04/16/22 0351 04/17/22 0500  Weight: 65.5 kg 63.9 kg 65.6 kg    ECG    An ECG dated 04/15/2022 was personally reviewed today and demonstrated:  Atrial fibrillation, rate  controlled  Physical Exam   GEN: No acute distress.   Neck: JVD elevated Cardiac: RRR, no murmur, rub, or gallop.  Respiratory: Nonlabored. Clear to auscultation bilaterally. GI: Soft, nontender, bowel sounds present. MS: No edema; No deformity. Neuro:  Nonfocal. Psych: Alert and oriented x 3. Normal affect.  Labs    Chemistry Recent Labs  Lab 04/15/22 1131 04/16/22 0440 04/17/22 0429  NA 137 136 136  K 3.3* 3.8 3.0*  CL 95* 92* 87*  CO2 31 33* 35*  GLUCOSE 113* 250* 250*  BUN 35* 30* 30*  CREATININE 1.49* 1.31* 1.41*  CALCIUM 8.5* 8.7* 8.9  PROT  --  6.5 7.0  ALBUMIN  --  3.4* 3.6  AST  --  51* 53*  ALT  --  78* 73*  ALKPHOS  --  61 60  BILITOT  --  1.6* 1.9*  GFRNONAA 33* 38* 35*  ANIONGAP '11 11 14     '$ Hematology Recent Labs  Lab 04/15/22 1131 04/16/22 0440 04/17/22 0429  WBC 5.3 4.2 3.2*  RBC 4.03 4.13 4.31  HGB 10.7* 10.9* 11.3*  HCT 35.0* 36.2 36.9  MCV 86.8 87.7 85.6  MCH 26.6 26.4 26.2  MCHC 30.6 30.1 30.6  RDW 15.9*  15.6* 15.6*  PLT 121* 113* 132*    Cardiac Enzymes Recent Labs  Lab 04/15/22 1131  TROPONINIHS 23*    BNP Recent Labs  Lab 04/15/22 1131 04/16/22 0439 04/17/22 0429  BNP 882.0* 765.0* 512.0*     DDimer Recent Labs  Lab 04/16/22 0440 04/17/22 0429  DDIMER 0.55* 0.65*     Radiology    DG CHEST PORT 1 VIEW  Result Date: 04/17/2022 CLINICAL DATA:  Pulmonary edema EXAM: PORTABLE CHEST 1 VIEW COMPARISON:  None Available. FINDINGS: Sternal wires overlie normal cardiac silhouette. Round 2.5 cm density projects over the midline sternotomy and is presumed external to patient. Lungs are relatively clear. No effusion, infiltrate or pneumothorax. IMPRESSION: No significant interval change. Midline foreign body is presumed external to patient. Electronically Signed   By: Suzy Bouchard M.D.   On: 04/17/2022 09:06   DG Chest Portable 1 View  Result Date: 04/15/2022 CLINICAL DATA:  Shortness of breath with chest congestion.  EXAM: PORTABLE CHEST 1 VIEW COMPARISON:  AP chest 04/10/2022 FINDINGS: Status post median sternotomy and CABG. Cardiac silhouette is again at the upper limits of normal size. Mediastinal contours are within normal limits. Small left-greater-than-right pleural effusions are unchanged to mildly improved from prior. Improved aeration of the bilateral lower lungs, now with mild interstitial thickening but resolution of the prior heterogeneous airspace opacities. No pneumothorax is seen. Numerous surgical clips again overlie the right axilla and right chest wall. No acute skeletal abnormality. IMPRESSION: 1. Small left-greater-than-right pleural effusions are unchanged to mildly improved from prior. 2. Improved aeration of the bilateral lower lungs. Electronically Signed   By: Yvonne Kendall M.D.   On: 04/15/2022 11:56     Assessment & Plan    Ms. Bier is a 89 y/o F known to have CAD s/p emergent CABG in 2016 (LIMA to LAD, SVG to OM and SVG to PDA), long standing persistent Afib, chronic diastolic heart failure, chronic hypoxic respiratory failure on home oxygen, HTN, HLD, DM 2 presented to the ER with 1 week history of DOE and cough. She is currently admitted to the hospitalist team for the management of acute on chronic hypoxic respiratory failure secondary to COVID-19 infection and acute on chronic diastolic heart failure exacerbation on IV Lasix 40 mg twice daily.   # Acute on chronic hypoxic respiratory failure secondary to COVID-19 infection -Management per primary team   # Acute on chronic diastolic heart failure exacerbation -Continue IV Lasix 40 mg twice daily   # Longstanding persistent atrial fibrillation, rate controlled -Continue metoprolol tartrate 50 mg twice daily -Continue amiodarone 200 mg once daily -Patient had multiple falls in the past.  Hold Eliquis.   # CAD s/p emergent CABG in 2016 (LIMA to LAD, SVG to OM and SVG to PDA), currently angina free -Continue aspirin 81 mg once  daily -Continue rosuvastatin 5 mg nightly   # HLD -Continue rosuvastatin 5 mg nightly.  Goal LDL less than 70.   # Valvular heart disease (moderate MR and trivial AI) -Outpatient management  I have spent a total of 33 minutes with patient reviewing chart , telemetry, EKGs, labs and examining patient as well as establishing an assessment and plan that was discussed with the patient.  > 50% of time was spent in direct patient care.      Signed, Chalmers Guest, MD  04/17/2022, 11:23 AM

## 2022-04-17 NOTE — Progress Notes (Signed)
PROGRESS NOTE   Misty Edwards  ELF:[id number] DOB: [birth year] DOA: 04/15/2022 PCP: Susy Frizzle, MD   Chief Complaint  Patient presents with   Pulmonary Edema   Level of care: Telemetry  Brief Admission History:  89 y.o. female with medical history significant of chronic diastolic heart failure, A-fib, CAD, diabetes, hyperlipidemia, hypertension presents to the hospital with 1 week history of shortness of breath as well as cough.  She saw her primary care physician on 1/25, at which time her Lasix was increased.  She continued to exhibit shortness of breath and saw her PCP again 1/30 in the morning.  At that time, patient was seen to the emergency department due to failure of outpatient diuresis, fluid overload, COVID.  Patient's grandson is at bedside who provides most of the history.  He states that patient took a home COVID test yesterday which turned out positive.  Patient has not had any fevers but admits to chills.  Normally uses 2 L nasal cannula O2 as needed, but has been using it more recently.  Denies any nausea, vomiting, diarrhea or abdominal pain.  Has poor appetite.   ED Course: Labs show potassium 3.3, creatinine 1.49, BNP 882, troponin 23.  COVID-19 was positive.  Chest x-ray revealed small left greater than right pleural effusion unchanged from previous. Improved aeration bilateral lower lungs.   Assessment and Plan:  Acute on chronic diastolic CHF exacerbation  -Followed by Dr. Harl Bowie outpatient  -BNP 882 -Hold home oral lasix and metolazone while on IV therapy -IV lasix 40 mg BID  -Lopressor 50 mg BID  -Strict I/Os, daily weight  -Consulted cardiology - appreciate recs     Filed Weights   04/15/22 1649 04/16/22 0351 04/17/22 0500  Weight: 65.5 kg 63.9 kg 65.6 kg    COVID-19  -Tested positive 1/29 via home test -Her symptoms began > 5 days, supportive care recommended at this time per new treatment algorithm. She is not requiring above her baseline O2 needs   -Supportive treatments as ordered, encourage IS, encourage prone positioning, encourage mobilization    Chronic hypoxic respiratory failure -Pt reports that she uses 2L O2 [place] at home    Persistent A Fib -Amiodarone, lopressor, eliquis on hold due to fall risk per cardiology   HTN -continue Norvasc, lopressor    Demand ischemia -Trop 23    CKD stage 3a -Baseline Cr 1-1.3    Controlled DM type 2 with steroid-induced hyperglycemia -A1c 6 -SSI -restarted 70/30 insulin 20 units BID   CBG (last 3)  Recent Labs    04/16/22 2034 04/17/22 0740 04/17/22 1136  GLUCAP 225* 249* 359*   Hypokalemia  -Repleted    HLD -Crestor     DVT prophylaxis: apixaban Code Status: full  Family Communication: GS 1/31, 2/1 Disposition: Status is: Inpatient Remains inpatient appropriate because: IV lasix for diuresis    Consultants:  Cardiology Dr. Dellia Cloud Procedures:   Antimicrobials:    Subjective: Pt has been ambulating without difficulty.    Objective: Vitals:   04/16/22 1316 04/16/22 2033 04/17/22 0328 04/17/22 0500  BP: (!) 123/41 137/64 (!) 131/48   Pulse: 79 77 71   Resp: '17 20 18   '$ Temp: 98.1 F (36.7 C) 97.8 F (36.6 C) 98 F (36.7 C)   TempSrc:   Oral   SpO2: 100% 99% 96%   Weight:    65.6 kg  Height:        Intake/Output Summary (Last 24 hours) at 04/17/2022 1413 Last data  filed at 04/17/2022 0500 Gross per 24 hour  Intake --  Output 800 ml  Net -800 ml   Filed Weights   04/15/22 1649 04/16/22 0351 04/17/22 0500  Weight: 65.5 kg 63.9 kg 65.6 kg   Examination:  General exam: Appears calm and comfortable  Respiratory system: no increased work of breathing.  Cardiovascular system: normal S1 & S2 heard. No JVD, murmurs, rubs, gallops or clicks. No pedal edema. Gastrointestinal system: Abdomen is nondistended, soft and nontender. No organomegaly or masses felt. Normal bowel sounds heard. Central nervous system: Alert and oriented. No focal neurological  deficits. Extremities: Symmetric 5 x 5 power. Skin: No rashes, lesions or ulcers. Psychiatry: Judgement and insight appear normal. Mood & affect appropriate.   Data Reviewed: I have personally reviewed following labs and imaging studies  CBC: Recent Labs  Lab 04/15/22 1131 04/16/22 0440 04/17/22 0429  WBC 5.3 4.2 3.2*  NEUTROABS 3.4 2.8 1.9  HGB 10.7* 10.9* 11.3*  HCT 35.0* 36.2 36.9  MCV 86.8 87.7 85.6  PLT 121* 113* 132*    Basic Metabolic Panel: Recent Labs  Lab 04/10/22 1504 04/15/22 1131 04/16/22 0440 04/17/22 0429  NA 139 137 136 136  K 4.4 3.3* 3.8 3.0*  CL 98 95* 92* 87*  CO2 35* 31 33* 35*  GLUCOSE 92 113* 250* 250*  BUN 28* 35* 30* 30*  CREATININE 1.53* 1.49* 1.31* 1.41*  CALCIUM 9.1 8.5* 8.7* 8.9  MG  --   --  1.9 1.8  PHOS  --   --  3.1 3.6    CBG: Recent Labs  Lab 04/16/22 1317 04/16/22 1646 04/16/22 2034 04/17/22 0740 04/17/22 1136  GLUCAP 249* 230* 225* 249* 359*    Recent Results (from the past 240 hour(s))  Resp panel by RT-PCR (RSV, Flu A&B, Covid) Anterior Nasal Swab     Status: Abnormal   Collection Time: 04/15/22 11:05 AM   Specimen: Anterior Nasal Swab  Result Value Ref Range Status   SARS Coronavirus 2 by RT PCR POSITIVE (A) NEGATIVE Final    Comment: (NOTE) SARS-CoV-2 target nucleic acids are DETECTED.  The SARS-CoV-2 RNA is generally detectable in upper respiratory specimens during the acute phase of infection. Positive results are indicative of the presence of the identified virus, but do not rule out bacterial infection or co-infection with other pathogens not detected by the test. Clinical correlation with patient history and other diagnostic information is necessary to determine patient infection status. The expected result is Negative.  Fact Sheet for Patients: EntrepreneurPulse.com.au  Fact Sheet for Healthcare Providers: IncredibleEmployment.be  This test is not yet approved or  cleared by the [place] FDA and  has been authorized for detection and/or diagnosis of SARS-CoV-2 by FDA under an Emergency Use Authorization (EUA).  This EUA will remain in effect (meaning this test can be used) for the duration of  the COVID-19 declaration under Section 564(b)(1) of the A ct, 21 U.S.C. section 360bbb-3(b)(1), unless the authorization is terminated or revoked sooner.     Influenza A by PCR NEGATIVE NEGATIVE Final   Influenza B by PCR NEGATIVE NEGATIVE Final    Comment: (NOTE) The Xpert Xpress SARS-CoV-2/FLU/RSV plus assay is intended as an aid in the diagnosis of influenza from Nasopharyngeal swab specimens and should not be used as a sole basis for treatment. Nasal washings and aspirates are unacceptable for Xpert Xpress SARS-CoV-2/FLU/RSV testing.  Fact Sheet for Patients: EntrepreneurPulse.com.au  Fact Sheet for Healthcare Providers: IncredibleEmployment.be  This test is not yet  approved or cleared by the [place] and has been authorized for detection and/or diagnosis of SARS-CoV-2 by FDA under an Emergency Use Authorization (EUA). This EUA will remain in effect (meaning this test can be used) for the duration of the COVID-19 declaration under Section 564(b)(1) of the Act, 21 U.S.C. section 360bbb-3(b)(1), unless the authorization is terminated or revoked.     Resp Syncytial Virus by PCR NEGATIVE NEGATIVE Final    Comment: (NOTE) Fact Sheet for Patients: EntrepreneurPulse.com.au  Fact Sheet for Healthcare Providers: IncredibleEmployment.be  This test is not yet approved or cleared by the [place] FDA and has been authorized for detection and/or diagnosis of SARS-CoV-2 by FDA under an Emergency Use Authorization (EUA). This EUA will remain in effect (meaning this test can be used) for the duration of the COVID-19 declaration under Section 564(b)(1) of the Act, 21  U.S.C. section 360bbb-3(b)(1), unless the authorization is terminated or revoked.  Performed at [facility], [address], [place], [place] [address]      Radiology Studies: DG CHEST PORT 1 VIEW  Result Date: 04/17/2022 CLINICAL DATA:  Pulmonary edema EXAM: PORTABLE CHEST 1 VIEW COMPARISON:  None Available. FINDINGS: Sternal wires overlie normal cardiac silhouette. Round 2.5 cm density projects over the midline sternotomy and is presumed external to patient. Lungs are relatively clear. No effusion, infiltrate or pneumothorax. IMPRESSION: No significant interval change. Midline foreign body is presumed external to patient. Electronically Signed   By: Suzy Bouchard M.D.   On: 04/17/2022 09:06    Scheduled Meds:  amiodarone  200 mg Oral Daily   amLODipine  5 mg Oral Daily   vitamin C  500 mg Oral Daily   aspirin  81 mg Oral Daily   famotidine  20 mg Oral Daily   ferrous sulfate  325 mg Oral Q breakfast   FLUoxetine  20 mg Oral Daily   furosemide  40 mg Intravenous BID   insulin aspart  0-9 Units Subcutaneous TID WC   insulin aspart protamine- aspart  20 Units Subcutaneous BID WC   lipase/protease/amylase  12,000 Units Oral With snacks   lipase/protease/amylase  24,000 Units Oral TID WC   metoprolol tartrate  50 mg Oral BID   pantoprazole  40 mg Oral Daily   rosuvastatin  5 mg Oral Daily   sodium chloride flush  3 mL Intravenous Q12H   triamcinolone cream   Topical BID   zinc sulfate  220 mg Oral Daily   Continuous Infusions:   LOS: 2 days   Time spent: 35 mins  Alayia Meggison Wynetta Emery, MD How to contact the [facility] Attending or Consulting provider [place] or covering provider during after hours [place], for this patient?  Check the care team in [facility] and look for a) attending/consulting [facility] provider listed and b) the [facility] team listed Log into [email or web address] and use [place]'s universal password to access. If you do not have the password, please contact the hospital operator. Locate the  [facility] provider you are looking for under [facility] and page to a number that you can be directly reached. If you still have difficulty reaching the provider, please page the [facility] (Director on Call) for the Hospitalists listed on amion for assistance.  04/17/2022, 2:13 PM

## 2022-04-18 DIAGNOSIS — J810 Acute pulmonary edema: Secondary | ICD-10-CM | POA: Diagnosis not present

## 2022-04-18 DIAGNOSIS — U071 COVID-19: Secondary | ICD-10-CM | POA: Diagnosis not present

## 2022-04-18 DIAGNOSIS — I5033 Acute on chronic diastolic (congestive) heart failure: Secondary | ICD-10-CM | POA: Diagnosis not present

## 2022-04-18 DIAGNOSIS — E785 Hyperlipidemia, unspecified: Secondary | ICD-10-CM

## 2022-04-18 DIAGNOSIS — I4891 Unspecified atrial fibrillation: Secondary | ICD-10-CM | POA: Diagnosis not present

## 2022-04-18 LAB — CBC WITH DIFFERENTIAL/PLATELET
Abs Immature Granulocytes: 0.01 10*3/uL (ref 0.00–0.07)
Basophils Absolute: 0 10*3/uL (ref 0.0–0.1)
Basophils Relative: 0 %
Eosinophils Absolute: 0 10*3/uL (ref 0.0–0.5)
Eosinophils Relative: 0 %
HCT: 41.2 % (ref 36.0–46.0)
Hemoglobin: 12.5 g/dL (ref 12.0–15.0)
Immature Granulocytes: 0 %
Lymphocytes Relative: 21 %
Lymphs Abs: 1.2 10*3/uL (ref 0.7–4.0)
MCH: 25.8 pg — ABNORMAL LOW (ref 26.0–34.0)
MCHC: 30.3 g/dL (ref 30.0–36.0)
MCV: 85.1 fL (ref 80.0–100.0)
Monocytes Absolute: 0.6 10*3/uL (ref 0.1–1.0)
Monocytes Relative: 11 %
Neutro Abs: 3.6 10*3/uL (ref 1.7–7.7)
Neutrophils Relative %: 68 %
Platelets: 161 10*3/uL (ref 150–400)
RBC: 4.84 MIL/uL (ref 3.87–5.11)
RDW: 15.3 % (ref 11.5–15.5)
WBC: 5.4 10*3/uL (ref 4.0–10.5)
nRBC: 0 % (ref 0.0–0.2)

## 2022-04-18 LAB — BASIC METABOLIC PANEL
Anion gap: 17 — ABNORMAL HIGH (ref 5–15)
BUN: 36 mg/dL — ABNORMAL HIGH (ref 8–23)
CO2: 34 mmol/L — ABNORMAL HIGH (ref 22–32)
Calcium: 9 mg/dL (ref 8.9–10.3)
Chloride: 86 mmol/L — ABNORMAL LOW (ref 98–111)
Creatinine, Ser: 1.53 mg/dL — ABNORMAL HIGH (ref 0.44–1.00)
GFR, Estimated: 32 mL/min — ABNORMAL LOW (ref >60)
Glucose, Bld: 90 mg/dL (ref 70–99)
Potassium: 3.1 mmol/L — ABNORMAL LOW (ref 3.5–5.1)
Sodium: 137 mmol/L (ref 135–145)

## 2022-04-18 LAB — MAGNESIUM: Magnesium: 2.5 mg/dL — ABNORMAL HIGH (ref 1.7–2.4)

## 2022-04-18 LAB — GLUCOSE, CAPILLARY: Glucose-Capillary: 145 mg/dL — ABNORMAL HIGH (ref 70–99)

## 2022-04-18 MED ORDER — PROCHLORPERAZINE EDISYLATE 10 MG/2ML IJ SOLN
10.0000 mg | Freq: Once | INTRAMUSCULAR | Status: AC
  Administered 2022-04-18: 10 mg via INTRAVENOUS
  Filled 2022-04-18: qty 2

## 2022-04-18 MED ORDER — AMIODARONE HCL 200 MG PO TABS: 200.0000 mg | ORAL_TABLET | Freq: Every day | ORAL | 3 refills | Status: DC

## 2022-04-18 MED ORDER — FUROSEMIDE 20 MG PO TABS: 20.0000 mg | ORAL_TABLET | ORAL | Status: DC

## 2022-04-18 MED ORDER — CETIRIZINE HCL 10 MG PO TABS: 10.0000 mg | ORAL_TABLET | Freq: Every day | ORAL | Status: DC

## 2022-04-18 MED ORDER — LORAZEPAM 1 MG PO TABS: 0.5000 mg | ORAL_TABLET | Freq: Two times a day (BID) | ORAL | Status: DC | PRN

## 2022-04-18 MED ORDER — FLUOXETINE HCL 20 MG PO CAPS: 20.0000 mg | ORAL_CAPSULE | Freq: Every day | ORAL | Status: DC

## 2022-04-18 MED ORDER — NOVOLIN 70/30 RELION (70-30) 100 UNIT/ML ~~LOC~~ SUSP: 20.0000 [IU] | SUBCUTANEOUS | Status: DC

## 2022-04-18 MED ORDER — CREON 24000-76000 UNITS PO CPEP: 24000.0000 [IU] | ORAL_CAPSULE | ORAL | Status: DC

## 2022-04-18 NOTE — Progress Notes (Signed)
Progress Note  Patient Name: Misty Edwards Date of Encounter: 04/18/2022  Primary Cardiologist: Carlyle Dolly, MD  Subjective   No acute events overnight.  Patient is being discharged today.  Inpatient Medications    Scheduled Meds:  amiodarone  200 mg Oral Daily   amLODipine  5 mg Oral Daily   vitamin C  500 mg Oral Daily   aspirin  81 mg Oral Daily   famotidine  20 mg Oral Daily   ferrous sulfate  325 mg Oral Q breakfast   FLUoxetine  20 mg Oral Daily   furosemide  40 mg Intravenous BID   insulin aspart  0-9 Units Subcutaneous TID WC   insulin aspart protamine- aspart  20 Units Subcutaneous BID WC   lipase/protease/amylase  12,000 Units Oral With snacks   lipase/protease/amylase  24,000 Units Oral TID WC   metoprolol tartrate  50 mg Oral BID   pantoprazole  40 mg Oral Daily   rosuvastatin  5 mg Oral Daily   sodium chloride flush  3 mL Intravenous Q12H   triamcinolone cream   Topical BID   zinc sulfate  220 mg Oral Daily   Continuous Infusions:   PRN Meds: acetaminophen **OR** acetaminophen, chlorpheniramine-HYDROcodone, guaiFENesin-dextromethorphan, LORazepam, ondansetron **OR** ondansetron (ZOFRAN) IV   Vital Signs    Vitals:   04/17/22 2300 04/18/22 0500 04/18/22 0535 04/18/22 0832  BP: 127/66  (!) 139/59 138/72  Pulse: 60  74 76  Resp: 18     Temp: 97.6 F (36.4 C)  98.1 F (36.7 C)   TempSrc:   Oral   SpO2: 92%  93%   Weight:  63.4 kg    Height:        Intake/Output Summary (Last 24 hours) at 04/18/2022 1144 Last data filed at 04/18/2022 0500 Gross per 24 hour  Intake --  Output 1400 ml  Net -1400 ml   Filed Weights   04/16/22 0351 04/17/22 0500 04/18/22 0500  Weight: 63.9 kg 65.6 kg 63.4 kg    ECG    An ECG dated 04/15/2022 was personally reviewed today and demonstrated:  Atrial fibrillation, rate controlled  Physical Exam   GEN: No acute distress.   Neck: JVD  Cardiac: RRR, no murmur, rub, or gallop.  Respiratory: Nonlabored.  Clear to auscultation bilaterally. GI: Soft, nontender, bowel sounds present. MS: No edema; No deformity. Neuro:  Nonfocal. Psych: Alert and oriented x 3. Normal affect.  Labs    Chemistry Recent Labs  Lab 04/16/22 0440 04/17/22 0429 04/18/22 0417  NA 136 136 137  K 3.8 3.0* 3.1*  CL 92* 87* 86*  CO2 33* 35* 34*  GLUCOSE 250* 250* 90  BUN 30* 30* 36*  CREATININE 1.31* 1.41* 1.53*  CALCIUM 8.7* 8.9 9.0  PROT 6.5 7.0  --   ALBUMIN 3.4* 3.6  --   AST 51* 53*  --   ALT 78* 73*  --   ALKPHOS 61 60  --   BILITOT 1.6* 1.9*  --   GFRNONAA 38* 35* 32*  ANIONGAP 11 14 17*     Hematology Recent Labs  Lab 04/16/22 0440 04/17/22 0429 04/18/22 0417  WBC 4.2 3.2* 5.4  RBC 4.13 4.31 4.84  HGB 10.9* 11.3* 12.5  HCT 36.2 36.9 41.2  MCV 87.7 85.6 85.1  MCH 26.4 26.2 25.8*  MCHC 30.1 30.6 30.3  RDW 15.6* 15.6* 15.3  PLT 113* 132* 161    Cardiac Enzymes Recent Labs  Lab 04/15/22 1131  TROPONINIHS 23*  BNP Recent Labs  Lab 04/15/22 1131 04/16/22 0439 04/17/22 0429  BNP 882.0* 765.0* 512.0*     DDimer Recent Labs  Lab 04/16/22 0440 04/17/22 0429  DDIMER 0.55* 0.65*     Radiology    DG CHEST PORT 1 VIEW  Result Date: 04/17/2022 CLINICAL DATA:  Pulmonary edema EXAM: PORTABLE CHEST 1 VIEW COMPARISON:  None Available. FINDINGS: Sternal wires overlie normal cardiac silhouette. Round 2.5 cm density projects over the midline sternotomy and is presumed external to patient. Lungs are relatively clear. No effusion, infiltrate or pneumothorax. IMPRESSION: No significant interval change. Midline foreign body is presumed external to patient. Electronically Signed   By: Suzy Bouchard M.D.   On: 04/17/2022 09:06     Assessment & Plan    Ms. Misty Edwards is a 89 y/o F known to have CAD s/p emergent CABG in 2016 (LIMA to LAD, SVG to OM and SVG to PDA), long standing persistent Afib, chronic diastolic heart failure, chronic hypoxic respiratory failure on home oxygen, HTN, HLD,  DM 2 presented to the ER with 1 week history of DOE and cough. She is currently admitted to the hospitalist team for the management of acute on chronic hypoxic respiratory failure secondary to COVID-19 infection and acute on chronic diastolic heart failure exacerbation on IV Lasix 40 mg twice daily.   # Acute on chronic hypoxic respiratory failure secondary to COVID-19 infection -Management per primary team   # Acute on chronic diastolic heart failure exacerbation -Switch IV to p.o. Lasix 40 mg once daily   # Longstanding persistent atrial fibrillation, rate controlled -Continue metoprolol tartrate 50 mg twice daily -Continue amiodarone 200 mg once daily -Patient had multiple falls in the past.  Called Eliquis.   # CAD s/p emergent CABG in 2016 (LIMA to LAD, SVG to OM and SVG to PDA), currently angina free -Continue aspirin 81 mg once daily -Continue rosuvastatin 5 mg nightly   # HLD -Continue rosuvastatin 5 mg nightly.  Goal LDL less than 70.   # Valvular heart disease (moderate MR and trivial AI) -Outpatient management   I have spent a total of 33 minutes with patient reviewing chart , telemetry, EKGs, labs and examining patient as well as establishing an assessment and plan that was discussed with the patient.  > 50% of time was spent in direct patient care.      Signed, Chalmers Guest, MD  04/18/2022, 11:44 AM

## 2022-04-18 NOTE — Progress Notes (Signed)
Patient complained of nausea throughout the shift. PRN Zofran given but no relief. MD Zierle-Ghosh notified and received an order for Compazine and a 12 lead EKG. Will continue to monitor.

## 2022-04-18 NOTE — Discharge Summary (Signed)
Physician Discharge Summary  Misty Edwards LFY:[id number] DOB: 21-Dec-1926 DOA: 04/15/2022  PCP: Susy Frizzle, MD  Admit date: 04/15/2022 Discharge date: 04/18/2022  Admitted From:  HOME  Disposition: [place] and 24/7 supervision  Recommendations for Outpatient Follow-up:  Follow up with PCP in 1 weeks Follow up with cardiology in 2 weeks  Please obtain BMP/CBC in 1-2 weeks  Home Health:  PT  Discharge Condition: STABLE   CODE STATUS: FULL DIET: resume prior home diet    Brief Hospitalization Summary: Please see all hospital notes, images, labs for full details of the hospitalization. ADMISSION HPI:  89 y.o. female with medical history significant of chronic diastolic heart failure, A-fib, CAD, diabetes, hyperlipidemia, hypertension presents to the hospital with 1 week history of shortness of breath as well as cough.  She saw her primary care physician on 1/25, at which time her Lasix was increased.  She continued to exhibit shortness of breath and saw her PCP again 1/30 in the morning.  At that time, patient was seen to the emergency department due to failure of outpatient diuresis, fluid overload, COVID.  Patient's grandson is at bedside who provides most of the history.  He states that patient took a home COVID test yesterday which turned out positive.  Patient has not had any fevers but admits to chills.  Normally uses 2 L nasal cannula O2 as needed, but has been using it more recently.  Denies any nausea, vomiting, diarrhea or abdominal pain.  Has poor appetite.   ED Course: Labs show potassium 3.3, creatinine 1.49, BNP 882, troponin 23.  COVID-19 was positive.  Chest x-ray revealed small left greater than right pleural effusion unchanged from previous. Improved aeration bilateral lower lungs.  HOSPITAL COURSE BY PROBLEM LIST  Acute on chronic diastolic CHF exacerbation  -Followed by Dr. Harl Bowie outpatient  -BNP 882 -Hold home oral lasix and metolazone while on IV  therapy -IV lasix 40 mg BID---transitioned to oral lasix   -Lopressor 50 mg BID  -Strict I/Os, daily weight  -Consulted cardiology - appreciate recs   Filed Weights   04/16/22 0351 04/17/22 0500 04/18/22 0500  Weight: 63.9 kg 65.6 kg 63.4 kg    COVID-19  -Tested positive 1/29 via home test -Her symptoms began > 5 days, supportive care recommended at this time per new treatment algorithm. She is not requiring above her baseline O2 needs  -Supportive treatments as ordered, encourage IS, encourage prone positioning, encourage mobilization    Chronic hypoxic respiratory failure -Pt reports that she uses 2L O2 [place] at home    Persistent A Fib -Amiodarone, lopressor, eliquis on hold due to fall risk per cardiology, however after speaking with patient's family they felt strongly that patient should continue taking the Eliquis as they feel like she has 24/7 supervision and that they are not as concerned about her fall risk and will discuss with her cardiologist Dr. Harl Bowie.  They verbalized understanding of the bleeding risks associated with continued use of eliquis.    HTN -continue Norvasc, lopressor    Demand ischemia -Trop 23    CKD stage 3a -Baseline Cr 1-1.3    Controlled DM type 2 with steroid-induced hyperglycemia -A1c 6 -SSI -restarted 70/30 insulin 20 units BID  CBG (last 3)  Recent Labs    04/17/22 1619 04/17/22 2301 04/18/22 0736  GLUCAP 250* 81 145*    Hypokalemia  -Repleted    HLD -Crestor    Discharge Diagnoses:  Principal Problem:   Acute on  chronic diastolic CHF (congestive heart failure) (HCC) Active Problems:   GERD   S/P CABG x 3   Dyslipidemia   Type 2 diabetes mellitus with vascular disease ([place])   Acute pulmonary edema (HCC)   Atrial fibrillation ([place])   COVID-19 virus infection   Discharge Instructions: Discharge Instructions     Ambulatory referral to Cardiology   Complete by: As directed    Hospital follow up in 2 weeks       Allergies as of 04/18/2022       Reactions   Calcium-containing Compounds Nausea Only   Morphine And Related Other (See Comments)   Hallucination   Raloxifene Itching   Evista- Face and eyes burning   Vitamin D Analogs Nausea Only        Medication List     STOP taking these medications    benzonatate 200 MG capsule Commonly known as: TESSALON   metolazone 5 MG tablet Commonly known as: ZAROXOLYN       TAKE these medications    amiodarone 200 MG tablet Commonly known as: PACERONE Take 1 tablet (200 mg total) by mouth daily. What changed:  how much to take how to take this when to take this additional instructions   amLODipine 5 MG tablet Commonly known as: NORVASC TAKE 1 TABLET BY MOUTH ONCE DAILY . APPOINTMENT REQUIRED FOR FUTURE REFILLS What changed: See the new instructions.   apixaban 5 MG Tabs tablet Commonly known as: ELIQUIS Take 1 tablet (5 mg total) by mouth 2 (two) times daily.   BD Insulin Syringe U/F 31G X 5/16" 0.5 ML Misc Generic drug: Insulin Syringe-Needle U-100 USE AS DIRECTED TWICE DAILY   Blood Glucose System Pak Kit Please dispense as One Touch Ultra. Use as directed to monitor FSBS 4x daily. Dx: E11.65   calcium carbonate 500 MG chewable tablet Commonly known as: TUMS - dosed in mg elemental calcium Chew 3 tablets by mouth as needed for heartburn or indigestion.   cetirizine 10 MG tablet Commonly known as: EQ Allergy Relief (Cetirizine) Take 1 tablet (10 mg total) by mouth at bedtime.   Creon 24000-76000 units Cpep Generic drug: Pancrelipase (Lip-Prot-Amyl) Take 1 capsule (24,000 Units total) by mouth See admin instructions. TAKE 2 CAPSULES BY MOUTH WITH MEALS AND 1 CAP WITH SNACKS x 2   Dexcom G7 Sensor Misc 1 Device by Does not apply route every 14 (fourteen) days.   famotidine 40 MG tablet Commonly known as: Pepcid Take 1 tablet (40 mg total) by mouth daily. What changed: when to take this   ferrous sulfate 325 (65  FE) MG tablet Take 325 mg by mouth daily with breakfast.   FLUoxetine 20 MG capsule Commonly known as: PROZAC Take 1 capsule (20 mg total) by mouth at bedtime.   furosemide 20 MG tablet Commonly known as: Lasix Take 1 tablet (20 mg total) by mouth See admin instructions. Take 40 mg in the morning and 20 mg at 4 pm.   gabapentin 100 MG capsule Commonly known as: NEURONTIN TAKE 1 CAPSULE BY MOUTH THREE TIMES DAILY AS NEEDED FOR NERVE PAIN What changed: See the new instructions.   Lancets Misc. Misc Pt has Accu Chek Aviva Plus - Needs just lancets  Checks BS 4-5 times per day. Disp 3 boxes(90 day Supply)/4 refills Dx code. E11.9   LORazepam 1 MG tablet Commonly known as: ATIVAN Take 0.5-1 tablets (0.5-1 mg total) by mouth 2 (two) times daily as needed for anxiety or sleep. 1 tablet at  bedtime. What changed: See the new instructions.   MAGnesium-Oxide 400 (240 Mg) MG tablet Generic drug: magnesium oxide TAKE 1 TABLET BY MOUTH ONCE DAILY . APPOINTMENT REQUIRED FOR FUTURE REFILLS What changed: See the new instructions.   metoprolol tartrate 50 MG tablet Commonly known as: LOPRESSOR Take 1 tablet (50 mg total) by mouth 2 (two) times daily.   multivitamin capsule Take 1 capsule by mouth every morning.   NovoLIN 70/30 ReliOn (70-30) 100 UNIT/ML injection Generic drug: insulin NPH-regular Human Inject 20-24 Units into the skin See admin instructions. INJECT 24 UNITS SUBCUTANEOUSLY IN THE MORNING AND 20 IN THE EVENING ( Sliding Scale) Checks 2 hours after pt eats.   OneTouch Delica Lancets 46E Misc CHECK BLOOD SUGAR 4-5 TIMES PER DAY   OneTouch Ultra test strip Generic drug: glucose blood USE 1 STRIP TO CHECK GLUCOSE FIVE TIMES DAILY   pantoprazole 40 MG tablet Commonly known as: PROTONIX Take 1 tablet (40 mg total) by mouth daily.   potassium chloride 10 MEQ tablet Commonly known as: KLOR-CON Take 1 tablet (10 mEq total) by mouth daily.   rosuvastatin 5 MG  tablet Commonly known as: CRESTOR Take 1 tablet by mouth once daily What changed: when to take this   SOOTHE OP Apply 2 drops to eye 2 (two) times daily.   triamcinolone cream 0.1 % Commonly known as: KENALOG Apply topically 2 (two) times daily.        Follow-up [place], [facility] Follow up.   Why: PT will call to schedule your next home visit. Contact information: [address] [place] [address] [phone]         Susy Frizzle, MD. Schedule an appointment as soon as possible for a visit in 2 week(s).   Specialty: Family Medicine Why: Hospital Follow Up Contact information: [address] [place] [address] [phone]         Arnoldo Lenis, MD. Schedule an appointment as soon as possible for a visit in 2 week(s).   Specialty: Cardiology Why: Hospital Follow Up Contact information: [place] [address] [phone]                Allergies  Allergen Reactions   Calcium-Containing Compounds Nausea Only   Morphine And Related Other (See Comments)    Hallucination   Raloxifene Itching    Evista- Face and eyes burning   Vitamin D Analogs Nausea Only   Allergies as of 04/18/2022       Reactions   Calcium-containing Compounds Nausea Only   Morphine And Related Other (See Comments)   Hallucination   Raloxifene Itching   Evista- Face and eyes burning   Vitamin D Analogs Nausea Only        Medication List     STOP taking these medications    benzonatate 200 MG capsule Commonly known as: TESSALON   metolazone 5 MG tablet Commonly known as: ZAROXOLYN       TAKE these medications    amiodarone 200 MG tablet Commonly known as: PACERONE Take 1 tablet (200 mg total) by mouth daily. What changed:  how much to take how to take this when to take this additional instructions   amLODipine 5 MG tablet Commonly known as: NORVASC TAKE 1 TABLET BY MOUTH ONCE  DAILY . APPOINTMENT REQUIRED FOR FUTURE REFILLS What changed: See the new instructions.   apixaban 5 MG Tabs tablet Commonly known  asArne [place] Take 1 tablet (5 mg total) by mouth 2 (two) times daily.   BD Insulin Syringe U/F 31G X 5/16" 0.5 ML Misc Generic drug: Insulin Syringe-Needle U-100 USE AS DIRECTED TWICE DAILY   Blood Glucose System Pak Kit Please dispense as One Touch Ultra. Use as directed to monitor FSBS 4x daily. Dx: E11.65   calcium carbonate 500 MG chewable tablet Commonly known as: TUMS - dosed in mg elemental calcium Chew 3 tablets by mouth as needed for heartburn or indigestion.   cetirizine 10 MG tablet Commonly known as: EQ Allergy Relief (Cetirizine) Take 1 tablet (10 mg total) by mouth at bedtime.   Creon 24000-76000 units Cpep Generic drug: Pancrelipase (Lip-Prot-Amyl) Take 1 capsule (24,000 Units total) by mouth See admin instructions. TAKE 2 CAPSULES BY MOUTH WITH MEALS AND 1 CAP WITH SNACKS x 2   Dexcom G7 Sensor Misc 1 Device by Does not apply route every 14 (fourteen) days.   famotidine 40 MG tablet Commonly known as: Pepcid Take 1 tablet (40 mg total) by mouth daily. What changed: when to take this   ferrous sulfate 325 (65 FE) MG tablet Take 325 mg by mouth daily with breakfast.   FLUoxetine 20 MG capsule Commonly known as: PROZAC Take 1 capsule (20 mg total) by mouth at bedtime.   furosemide 20 MG tablet Commonly known as: Lasix Take 1 tablet (20 mg total) by mouth See admin instructions. Take 40 mg in the morning and 20 mg at 4 pm.   gabapentin 100 MG capsule Commonly known as: NEURONTIN TAKE 1 CAPSULE BY MOUTH THREE TIMES DAILY AS NEEDED FOR NERVE PAIN What changed: See the new instructions.   Lancets Misc. Misc Pt has Accu Chek Aviva Plus - Needs just lancets  Checks BS 4-5 times per day. Disp 3 boxes(90 day Supply)/4 refills Dx code. E11.9   LORazepam 1 MG tablet Commonly known as: ATIVAN Take 0.5-1 tablets (0.5-1 mg total) by  mouth 2 (two) times daily as needed for anxiety or sleep. 1 tablet at bedtime. What changed: See the new instructions.   MAGnesium-Oxide 400 (240 Mg) MG tablet Generic drug: magnesium oxide TAKE 1 TABLET BY MOUTH ONCE DAILY . APPOINTMENT REQUIRED FOR FUTURE REFILLS What changed: See the new instructions.   metoprolol tartrate 50 MG tablet Commonly known as: LOPRESSOR Take 1 tablet (50 mg total) by mouth 2 (two) times daily.   multivitamin capsule Take 1 capsule by mouth every morning.   NovoLIN 70/30 ReliOn (70-30) 100 UNIT/ML injection Generic drug: insulin NPH-regular Human Inject 20-24 Units into the skin See admin instructions. INJECT 24 UNITS SUBCUTANEOUSLY IN THE MORNING AND 20 IN THE EVENING ( Sliding Scale) Checks 2 hours after pt eats.   OneTouch Delica Lancets 14N Misc CHECK BLOOD SUGAR 4-5 TIMES PER DAY   OneTouch Ultra test strip Generic drug: glucose blood USE 1 STRIP TO CHECK GLUCOSE FIVE TIMES DAILY   pantoprazole 40 MG tablet Commonly known as: PROTONIX Take 1 tablet (40 mg total) by mouth daily.   potassium chloride 10 MEQ tablet Commonly known as: KLOR-CON Take 1 tablet (10 mEq total) by mouth daily.   rosuvastatin 5 MG tablet Commonly known as: CRESTOR Take 1 tablet by mouth once daily What changed: when to take this   SOOTHE OP Apply 2 drops to eye 2 (two) times daily.   triamcinolone cream 0.1 % Commonly known as: KENALOG Apply topically 2 (two) times daily.        Procedures/Studies: DG CHEST  PORT 1 VIEW  Result Date: 04/17/2022 CLINICAL DATA:  Pulmonary edema EXAM: PORTABLE CHEST 1 VIEW COMPARISON:  None Available. FINDINGS: Sternal wires overlie normal cardiac silhouette. Round 2.5 cm density projects over the midline sternotomy and is presumed external to patient. Lungs are relatively clear. No effusion, infiltrate or pneumothorax. IMPRESSION: No significant interval change. Midline foreign body is presumed external to patient.  Electronically Signed   By: Suzy Bouchard M.D.   On: 04/17/2022 09:06   DG Chest Portable 1 View  Result Date: 04/15/2022 CLINICAL DATA:  Shortness of breath with chest congestion. EXAM: PORTABLE CHEST 1 VIEW COMPARISON:  AP chest 04/10/2022 FINDINGS: Status post median sternotomy and CABG. Cardiac silhouette is again at the upper limits of normal size. Mediastinal contours are within normal limits. Small left-greater-than-right pleural effusions are unchanged to mildly improved from prior. Improved aeration of the bilateral lower lungs, now with mild interstitial thickening but resolution of the prior heterogeneous airspace opacities. No pneumothorax is seen. Numerous surgical clips again overlie the right axilla and right chest wall. No acute skeletal abnormality. IMPRESSION: 1. Small left-greater-than-right pleural effusions are unchanged to mildly improved from prior. 2. Improved aeration of the bilateral lower lungs. Electronically Signed   By: Yvonne Kendall M.D.   On: 04/15/2022 11:56   DG Chest 2 View  Result Date: 04/13/2022 CLINICAL DATA:  Right basilar crackles. EXAM: CHEST - 2 VIEW COMPARISON:  Chest radiograph 03/01/2022 FINDINGS: Previous median sternotomy. The heart is upper normal in size. Mediastinal contours are stable. Left pleural effusion has decreased from prior exam with small to moderate residual. There is a small right pleural effusion. Mild diffuse interstitial prominence is not significantly changed. Mild patchy bibasilar opacities likely atelectasis related to pleural effusions. No visible pneumothorax. Right axillary surgical clips. IMPRESSION: 1. Decreased left pleural effusion with small to moderate residual. Small right pleural effusion. Patchy bibasilar opacities are likely atelectasis related to pleural effusions. Recommend clinical correlation for signs and symptoms of infection. 2. Unchanged diffuse interstitial prominence since December, likely pulmonary edema.  Electronically Signed   By: Keith Rake M.D.   On: 04/13/2022 18:01     Subjective: Pt reports she feels much better, wanting to go home.  No SOB or CP.   Discharge Exam: Vitals:   04/18/22 0535 04/18/22 0832  BP: (!) 139/59 138/72  Pulse: 74 76  Resp:    Temp: 98.1 F (36.7 C)   SpO2: 93%    Vitals:   04/17/22 2300 04/18/22 0500 04/18/22 0535 04/18/22 0832  BP: 127/66  (!) 139/59 138/72  Pulse: 60  74 76  Resp: 18     Temp: 97.6 F (36.4 C)  98.1 F (36.7 C)   TempSrc:   Oral   SpO2: 92%  93%   Weight:  63.4 kg    Height:       General: Pt is alert, awake, not in acute distress Cardiovascular: normal S1/S2 +, no rubs, no gallops Respiratory: CTA bilaterally, no wheezing, no rhonchi Abdominal: Soft, NT, ND, bowel sounds + Extremities: trace edema, no cyanosis   The results of significant diagnostics from this hospitalization (including imaging, microbiology, ancillary and laboratory) are listed below for reference.     Microbiology: Recent Results (from the past 240 hour(s))  Resp panel by RT-PCR (RSV, Flu A&B, Covid) Anterior Nasal Swab     Status: Abnormal   Collection Time: 04/15/22 11:05 AM   Specimen: Anterior Nasal Swab  Result Value Ref Range Status   SARS  Coronavirus 2 by RT PCR POSITIVE (A) NEGATIVE Final    Comment: (NOTE) SARS-CoV-2 target nucleic acids are DETECTED.  The SARS-CoV-2 RNA is generally detectable in upper respiratory specimens during the acute phase of infection. Positive results are indicative of the presence of the identified virus, but do not rule out bacterial infection or co-infection with other pathogens not detected by the test. Clinical correlation with patient history and other diagnostic information is necessary to determine patient infection status. The expected result is Negative.  Fact Sheet for Patients: EntrepreneurPulse.com.au  Fact Sheet for Healthcare  Providers: IncredibleEmployment.be  This test is not yet approved or cleared by the [place] FDA and  has been authorized for detection and/or diagnosis of SARS-CoV-2 by FDA under an Emergency Use Authorization (EUA).  This EUA will remain in effect (meaning this test can be used) for the duration of  the COVID-19 declaration under Section 564(b)(1) of the A ct, 21 U.S.C. section 360bbb-3(b)(1), unless the authorization is terminated or revoked sooner.     Influenza A by PCR NEGATIVE NEGATIVE Final   Influenza B by PCR NEGATIVE NEGATIVE Final    Comment: (NOTE) The Xpert Xpress SARS-CoV-2/FLU/RSV plus assay is intended as an aid in the diagnosis of influenza from Nasopharyngeal swab specimens and should not be used as a sole basis for treatment. Nasal washings and aspirates are unacceptable for Xpert Xpress SARS-CoV-2/FLU/RSV testing.  Fact Sheet for Patients: EntrepreneurPulse.com.au  Fact Sheet for Healthcare Providers: IncredibleEmployment.be  This test is not yet approved or cleared by the [place] FDA and has been authorized for detection and/or diagnosis of SARS-CoV-2 by FDA under an Emergency Use Authorization (EUA). This EUA will remain in effect (meaning this test can be used) for the duration of the COVID-19 declaration under Section 564(b)(1) of the Act, 21 U.S.C. section 360bbb-3(b)(1), unless the authorization is terminated or revoked.     Resp Syncytial Virus by PCR NEGATIVE NEGATIVE Final    Comment: (NOTE) Fact Sheet for Patients: EntrepreneurPulse.com.au  Fact Sheet for Healthcare Providers: IncredibleEmployment.be  This test is not yet approved or cleared by the [place] FDA and has been authorized for detection and/or diagnosis of SARS-CoV-2 by FDA under an Emergency Use Authorization (EUA). This EUA will remain in effect (meaning this test can be  used) for the duration of the COVID-19 declaration under Section 564(b)(1) of the Act, 21 U.S.C. section 360bbb-3(b)(1), unless the authorization is terminated or revoked.  Performed at [facility], [address] [place], [place] [address]      Labs: BNP (last 3 results) Recent Labs    04/15/22 1131 04/16/22 0439 04/17/22 0429  BNP 882.0* 765.0* 332.9*   Basic Metabolic Panel: Recent Labs  Lab 04/15/22 1131 04/16/22 0440 04/17/22 0429 04/18/22 0417  NA 137 136 136 137  K 3.3* 3.8 3.0* 3.1*  CL 95* 92* 87* 86*  CO2 31 33* 35* 34*  GLUCOSE 113* 250* 250* 90  BUN 35* 30* 30* 36*  CREATININE 1.49* 1.31* 1.41* 1.53*  CALCIUM 8.5* 8.7* 8.9 9.0  MG  --  1.9 1.8 2.5*  PHOS  --  3.1 3.6  --    Liver Function Tests: Recent Labs  Lab 04/16/22 0440 04/17/22 0429  AST 51* 53*  ALT 78* 73*  ALKPHOS 61 60  BILITOT 1.6* 1.9*  PROT 6.5 7.0  ALBUMIN 3.4* 3.6   No results for input(s): "LIPASE", "AMYLASE" in the last 168 hours. No results for input(s): "AMMONIA" in the last 168 hours.  CBC: Recent Labs  Lab 04/15/22 1131 04/16/22 0440 04/17/22 0429 04/18/22 0417  WBC 5.3 4.2 3.2* 5.4  NEUTROABS 3.4 2.8 1.9 3.6  HGB 10.7* 10.9* 11.3* 12.5  HCT 35.0* 36.2 36.9 41.2  MCV 86.8 87.7 85.6 85.1  PLT 121* 113* 132* 161   Cardiac Enzymes: No results for input(s): "CKTOTAL", "CKMB", "CKMBINDEX", "TROPONINI" in the last 168 hours. BNP: Invalid input(s): "POCBNP" CBG: Recent Labs  Lab 04/17/22 0740 04/17/22 1136 04/17/22 1619 04/17/22 2301 04/18/22 0736  GLUCAP 249* 359* 250* 81 145*   D-Dimer Recent Labs    04/16/22 0440 04/17/22 0429  DDIMER 0.55* 0.65*   Hgb A1c No results for input(s): "HGBA1C" in the last 72 hours. Lipid Profile No results for input(s): "CHOL", "HDL", "LDLCALC", "TRIG", "CHOLHDL", "LDLDIRECT" in the last 72 hours. Thyroid function studies No results for input(s): "TSH", "T4TOTAL", "T3FREE", "THYROIDAB" in the last 72 hours.  Invalid  input(s): "FREET3" Anemia work up Recent Labs    04/16/22 0440 04/17/22 0429  FERRITIN 278 344*   Urinalysis    Component Value Date/Time   COLORURINE STRAW (A) 03/11/2022 1635   APPEARANCEUR CLEAR 03/11/2022 1635   LABSPEC 1.005 03/11/2022 1635   PHURINE 6.0 03/11/2022 1635   GLUCOSEU NEGATIVE 03/11/2022 1635   HGBUR NEGATIVE 03/11/2022 1635   BILIRUBINUR NEGATIVE 03/11/2022 1635   KETONESUR NEGATIVE 03/11/2022 1635   PROTEINUR NEGATIVE 03/11/2022 1635   UROBILINOGEN 0.2 09/02/2014 1445   NITRITE NEGATIVE 03/11/2022 1635   LEUKOCYTESUR NEGATIVE 03/11/2022 1635   Sepsis Labs Recent Labs  Lab 04/15/22 1131 04/16/22 0440 04/17/22 0429 04/18/22 0417  WBC 5.3 4.2 3.2* 5.4   Microbiology Recent Results (from the past 240 hour(s))  Resp panel by RT-PCR (RSV, Flu A&B, Covid) Anterior Nasal Swab     Status: Abnormal   Collection Time: 04/15/22 11:05 AM   Specimen: Anterior Nasal Swab  Result Value Ref Range Status   SARS Coronavirus 2 by RT PCR POSITIVE (A) NEGATIVE Final    Comment: (NOTE) SARS-CoV-2 target nucleic acids are DETECTED.  The SARS-CoV-2 RNA is generally detectable in upper respiratory specimens during the acute phase of infection. Positive results are indicative of the presence of the identified virus, but do not rule out bacterial infection or co-infection with other pathogens not detected by the test. Clinical correlation with patient history and other diagnostic information is necessary to determine patient infection status. The expected result is Negative.  Fact Sheet for Patients: EntrepreneurPulse.com.au  Fact Sheet for Healthcare Providers: IncredibleEmployment.be  This test is not yet approved or cleared by the [place] FDA and  has been authorized for detection and/or diagnosis of SARS-CoV-2 by FDA under an Emergency Use Authorization (EUA).  This EUA will remain in effect (meaning this test can be  used) for the duration of  the COVID-19 declaration under Section 564(b)(1) of the A ct, 21 U.S.C. section 360bbb-3(b)(1), unless the authorization is terminated or revoked sooner.     Influenza A by PCR NEGATIVE NEGATIVE Final   Influenza B by PCR NEGATIVE NEGATIVE Final    Comment: (NOTE) The Xpert Xpress SARS-CoV-2/FLU/RSV plus assay is intended as an aid in the diagnosis of influenza from Nasopharyngeal swab specimens and should not be used as a sole basis for treatment. Nasal washings and aspirates are unacceptable for Xpert Xpress SARS-CoV-2/FLU/RSV testing.  Fact Sheet for Patients: EntrepreneurPulse.com.au  Fact Sheet for Healthcare Providers: IncredibleEmployment.be  This test is not yet approved or cleared by the [place] FDA and has been  authorized for detection and/or diagnosis of SARS-CoV-2 by FDA under an Emergency Use Authorization (EUA). This EUA will remain in effect (meaning this test can be used) for the duration of the COVID-19 declaration under Section 564(b)(1) of the Act, 21 U.S.C. section 360bbb-3(b)(1), unless the authorization is terminated or revoked.     Resp Syncytial Virus by PCR NEGATIVE NEGATIVE Final    Comment: (NOTE) Fact Sheet for Patients: EntrepreneurPulse.com.au  Fact Sheet for Healthcare Providers: IncredibleEmployment.be  This test is not yet approved or cleared by the [place] FDA and has been authorized for detection and/or diagnosis of SARS-CoV-2 by FDA under an Emergency Use Authorization (EUA). This EUA will remain in effect (meaning this test can be used) for the duration of the COVID-19 declaration under Section 564(b)(1) of the Act, 21 U.S.C. section 360bbb-3(b)(1), unless the authorization is terminated or revoked.  Performed at [facility], [address] [place], [place] [address]    Time coordinating discharge: 44 mins    SIGNED:  Irwin Brakeman, MD  [facility] 04/18/2022, 11:42 AM How to contact the [facility] Attending or Consulting provider [place] or covering provider during after hours [place], for this patient?  Check the care team in [facility] and look for a) attending/consulting TRH provider listed and b) the [facility] team listed Log into [email or web address] and use [place] universal password to access. If you do not have the password, please contact the hospital operator. Locate the [facility] provider you are looking for under [facility] and page to a number that you can be directly reached. If you still have difficulty reaching the provider, please page the [facility] (Director on Call) for the Hospitalists listed on amion for assistance.

## 2022-04-18 NOTE — Discharge Instructions (Signed)
IMPORTANT INFORMATION: PAY CLOSE ATTENTION   PHYSICIAN DISCHARGE INSTRUCTIONS  Follow with Primary care provider  Pickard, Warren T, MD  and other consultants as instructed by your Hospitalist Physician  SEEK MEDICAL CARE OR RETURN TO EMERGENCY ROOM IF SYMPTOMS COME BACK, WORSEN OR NEW PROBLEM DEVELOPS   Please note: You were cared for by a hospitalist during your hospital stay. Every effort will be made to forward records to your primary care provider.  You can request that your primary care provider send for your hospital records if they have not received them.  Once you are discharged, your primary care physician will handle any further medical issues. Please note that NO REFILLS for any discharge medications will be authorized once you are discharged, as it is imperative that you return to your primary care physician (or establish a relationship with a primary care physician if you do not have one) for your post hospital discharge needs so that they can reassess your need for medications and monitor your lab values.  Please get a complete blood count and chemistry panel checked by your Primary MD at your next visit, and again as instructed by your Primary MD.  Get Medicines reviewed and adjusted: Please take all your medications with you for your next visit with your Primary MD  Laboratory/radiological data: Please request your Primary MD to go over all hospital tests and procedure/radiological results at the follow up, please ask your primary care provider to get all Hospital records sent to his/her office.  In some cases, they will be blood work, cultures and biopsy results pending at the time of your discharge. Please request that your primary care provider follow up on these results.  If you are diabetic, please bring your blood sugar readings with you to your follow up appointment with primary care.    Please call and make your follow up appointments as soon as possible.    Also  Note the following: If you experience worsening of your admission symptoms, develop shortness of breath, life threatening emergency, suicidal or homicidal thoughts you must seek medical attention immediately by calling 911 or calling your MD immediately  if symptoms less severe.  You must read complete instructions/literature along with all the possible adverse reactions/side effects for all the Medicines you take and that have been prescribed to you. Take any new Medicines after you have completely understood and accpet all the possible adverse reactions/side effects.   Do not drive when taking Pain medications or sleeping medications (Benzodiazepines)  Do not take more than prescribed Pain, Sleep and Anxiety Medications. It is not advisable to combine anxiety,sleep and pain medications without talking with your primary care practitioner  Special Instructions: If you have smoked or chewed Tobacco  in the last 2 yrs please stop smoking, stop any regular Alcohol  and or any Recreational drug use.  Wear Seat belts while driving.  Do not drive if taking any narcotic, mind altering or controlled substances or recreational drugs or alcohol.       

## 2022-04-18 NOTE — TOC Transition Note (Addendum)
Transition of Care [facility]) - CM/SW Discharge Note   Patient Details  Name: Misty Edwards MRN: [id number] Date of Birth: 10-12-1926  Transition [facility]) CM/SW Contact:  Salome Arnt, LCSW Phone Number: 04/18/2022, 11:10 AM   Clinical Narrative:  Pt d/c today. Jason with [facility] notified for HHPT. Orders in. Start of care scheduled for 2/5. MD agreeable.      Final next level of care: Home w Home Health Services Barriers to Discharge: Barriers Resolved   Patient Goals and CMS Choice CMS Medicare.gov Compare Post Acute Care list provided to:: Patient Represenative (must comment) Choice offered to / list presented to : Adult Children  Discharge Placement                         Discharge Plan and Services Additional resources added to the After Visit Summary for                            [facility] Arranged: PT [facility] Agency: [place] ([facility]) Date [place]: 04/18/22 Time [place]: 1110 Representative spoke with at [place]: [place] ([place]) Interventions [place]: No Food Insecurity (04/15/2022)  Housing: Low Risk  (04/15/2022)  Transportation Needs: No Transportation Needs (04/15/2022)  Utilities: Not At Risk (04/15/2022)  Depression (PHQ2-9): Low Risk  (04/01/2022)  Tobacco Use: Low Risk  (04/15/2022)     Readmission Risk Interventions    04/16/2022   11:39 AM 03/05/2022    1:14 PM  Readmission Risk Prevention Plan  Transportation Screening Complete Complete  PCP or Specialist Appt within 5-7 Days  Complete  Home Care Screening  Complete  Medication Review (RN CM)  Complete  Medication Review (Speculator) Complete   PCP or Specialist appointment within 3-5 days of discharge Not Complete   HRI or [place] Complete   SW Recovery Care/Counseling Consult Complete   [place] Not Applicable

## 2022-04-18 NOTE — Care Management Important Message (Addendum)
Important Message  Patient Details  Name: Misty Edwards MRN: [id number] Date of Birth: January 08, 1927   Medicare Important Message Given:  Yes (copy to be placed in room for son) [name])    [name] 04/18/2022, 11:22 AM

## 2022-04-21 ENCOUNTER — Telehealth: Payer: Self-pay

## 2022-04-21 NOTE — Telephone Encounter (Signed)
Transition Care Management Follow-up Telephone Call Date of discharge and from where: [facility] 04/18/2022 How have you been since you were released from the hospital? better Any questions or concerns? No  Items Reviewed: Did the pt receive and understand the discharge instructions provided? Yes  Medications obtained and verified? Yes  Other? No  Any new allergies since your discharge? No  Dietary orders reviewed? Yes Do you have support at home? Yes   Home Care and Equipment/Supplies: Were home health services ordered? yes If so, what is the name of the agency? [facility]  Has the agency set up a time to come to the patient's home? yes Were any new equipment or medical supplies ordered?  No What is the name of the medical supply agency? N/a Were you able to get the supplies/equipment? no Do you have any questions related to the use of the equipment or supplies? No  Functional Questionnaire: (I = Independent and D = Dependent) ADLs: I  Bathing/Dressing- I  Meal Prep- I  Eating- I  Maintaining continence- I  Transferring/Ambulation- I  Managing Meds- I  Follow up appointments reviewed:  PCP Hospital f/u appt confirmed? Yes  Scheduled to see Dr Dennard Schaumann on 04/25/2022 @ 8:00. [place] [facility] f/u appt confirmed? No  Patient will call for appt Are transportation arrangements needed? No  If their condition worsens, is the pt aware to call PCP or go to the Emergency Dept.? Yes Was the patient provided with contact information for the PCP's office or ED? Yes Was to pt encouraged to call back with questions or concerns? Yes Juanda Crumble, LPN [place] Direct Dial [phone]

## 2022-04-25 ENCOUNTER — Encounter: Payer: Self-pay | Admitting: Family Medicine

## 2022-04-25 ENCOUNTER — Ambulatory Visit (INDEPENDENT_AMBULATORY_CARE_PROVIDER_SITE_OTHER): Payer: PPO | Admitting: Family Medicine

## 2022-04-25 VITALS — BP 118/58 | HR 63 | Temp 98.6°F | Ht 63.0 in | Wt 142.4 lb

## 2022-04-25 DIAGNOSIS — I509 Heart failure, unspecified: Secondary | ICD-10-CM | POA: Diagnosis not present

## 2022-04-25 NOTE — Progress Notes (Signed)
Subjective:    Patient ID: Misty Edwards, female    DOB: 1926/10/13, 89 y.o.   MRN: [id number]   04/01/22 Patient was admitted in early December for atrial fibrillation.  She was then admitted in late December for pulmonary edema and fluid retention.  She was previously on Lasix 40 mg twice daily.  Recently she had lab work that showed her creatinine had increased from 0.9-1.6.  As result her cardiologist decreased her Lasix to 40 mg in the morning and 20 mg in the evening.  She is here today for follow-up.  She is still in atrial fibrillation today with an irregularly irregular rhythm.  She has +1 pitting edema in both legs up to her mid shin.  However she has bibasilar crackles in both lungs.  She denies any chest pain or shortness of breath.  Her weight today at home was 149 pounds.  At that time, my plan was: Patient appears to be fluid overloaded on exam however her most recent lab work suggested dehydration.  Therefore, I would like to repeat her BMP to see where her creatinine is prior to increasing her diuretic.  I believe her baseline creatinine is between 1.1 and 1.3.  Therefore if she is able increase Lasix up to 40 mg twice daily to avoid fluid retention and pulmonary edema.  We will likely need to monitor her creatinine closely.  We also discussed adding Jardiance for diastolic heart failure however she recently had a urinary tract infection and her creatinine and fluid status is still in flux so I would like to see the patient stable on her diuretic prior to adding an additional medication  04/10/22  Patient's labs at the last visit showed worsening kidney function so we elected to continue Lasix 40 mg in the morning and 20 mg in the evening.  However he has been worse over last few days.  Reports recent shortness of breath and cough.  On exam he has +1 edema in both legs and worsening crackles right greater than left.  On room air she is 88%.  She has gained approximately 2 pounds or 3  pounds on her scale at home.  At that time, my plan was: Very complicated patient with worsening kidney function but appears to be fluid overloaded with pulmonary edema.  Increase Lasix to 60 mg in the morning and 40 mg in the afternoon.  Obtain baseline renal function today with creatinine and potassium level.  Send the patient to the hospital to get a chest x-ray to rule out right-sided pneumonia given the asymmetry in the pulmonary crackles.  If x-ray confirms pulmonary edema and shows no evidence of infiltrate, I plan to recheck her renal function and electrolytes next week.  If her shortness of breath is worsening she needs to go to the hospital.  We will address her renal function next week but at the present time she is decompensating and appears to be fluid overloaded and requires diuresis.  04/15/22 CXR- IMPRESSION: 1. Decreased left pleural effusion with small to moderate residual. Small right pleural effusion. Patchy bibasilar opacities are likely atelectasis related to pleural effusions. Recommend clinical correlation for signs and symptoms of infection. 2. Unchanged diffuse interstitial prominence since December, likely pulmonary edema. BNP=493 Despite increasing lasix, no improvement in edema, so yesterday I recommended dosing zaroxolyn prior to lasix to improve diuresis.  Here for follow up.  Patient has gotten progressively weaker over the weekend.  Despite diuresing 3 pounds, she is still congested  audibly in both lungs.  Bilateral crackles.  She is short of breath with minimal activity.  She is unable to stand now.  She started having diarrhea over the weekend and last night was so weak that she could not even stand.  She is not running fevers.  However she did test positive for COVID yesterday.  At that time, my plan was: Patient is in acute congestive heart failure.  She has bilateral crackles midway up in both of her lungs.  She has shortness of breath and profound weakness.  She  needs diuresis.  However she also has renal insufficiency so diuresis needs to be monitored carefully.  She has failed outpatient diuresis with Lasix and metolazone and continues to have pulmonary edema on exam and appears to be fluid overloaded.  This is now complicated by being COVID-positive and having failure to thrive and weakness and unable to stand.  Therefore she needs to be in the hospital.  Recommended she go immediately to the emergency room  Admit date: 04/15/2022 Discharge date: 04/18/2022   Admitted From:  HOME  Disposition: [place] and 24/7 supervision   Recommendations for Outpatient Follow-up:  Follow up with PCP in 1 weeks Follow up with cardiology in 2 weeks  Please obtain BMP/CBC in 1-2 weeks   Home Health:  PT   Discharge Condition: STABLE   CODE STATUS: FULL DIET: resume prior home diet     Brief Hospitalization Summary: Please see all hospital notes, images, labs for full details of the hospitalization. ADMISSION HPI:  89 y.o. female with medical history significant of chronic diastolic heart failure, A-fib, CAD, diabetes, hyperlipidemia, hypertension presents to the hospital with 1 week history of shortness of breath as well as cough.  She saw her primary care physician on 1/25, at which time her Lasix was increased.  She continued to exhibit shortness of breath and saw her PCP again 1/30 in the morning.  At that time, patient was seen to the emergency department due to failure of outpatient diuresis, fluid overload, COVID.  Patient's grandson is at bedside who provides most of the history.  He states that patient took a home COVID test yesterday which turned out positive.  Patient has not had any fevers but admits to chills.  Normally uses 2 L nasal cannula O2 as needed, but has been using it more recently.  Denies any nausea, vomiting, diarrhea or abdominal pain.  Has poor appetite.   ED Course: Labs show potassium 3.3, creatinine 1.49, BNP 882, troponin 23.   COVID-19 was positive.  Chest x-ray revealed small left greater than right pleural effusion unchanged from previous. Improved aeration bilateral lower lungs.   HOSPITAL COURSE BY PROBLEM LIST   Acute on chronic diastolic CHF exacerbation  -Followed by Dr. Harl Bowie outpatient  -BNP 882 -Hold home oral lasix and metolazone while on IV therapy -IV lasix 40 mg BID---transitioned to oral lasix   -Lopressor 50 mg BID  -Strict I/Os, daily weight  -Consulted cardiology - appreciate recs        Filed Weights    04/16/22 0351 04/17/22 0500 04/18/22 0500  Weight: 63.9 kg 65.6 kg 63.4 kg    COVID-19  -Tested positive 1/29 via home test -Her symptoms began > 5 days, supportive care recommended at this time per new treatment algorithm. She is not requiring above her baseline O2 needs  -Supportive treatments as ordered, encourage IS, encourage prone positioning, encourage mobilization    Chronic hypoxic respiratory failure -Pt reports  that she uses 2L O2 [place] at home    Persistent A Fib -Amiodarone, lopressor, eliquis on hold due to fall risk per cardiology, however after speaking with patient's family they felt strongly that patient should continue taking the Eliquis as they feel like she has 24/7 supervision and that they are not as concerned about her fall risk and will discuss with her cardiologist Dr. Harl Bowie.  They verbalized understanding of the bleeding risks associated with continued use of eliquis.    HTN -continue Norvasc, lopressor    Demand ischemia -Trop 23    CKD stage 3a -Baseline Cr 1-1.3    Controlled DM type 2 with steroid-induced hyperglycemia -A1c 6 -SSI -restarted 70/30 insulin 20 units BID  CBG (last 3)  Recent Labs (last 2 labs)       Recent Labs    04/17/22 1619 04/17/22 2301 04/18/22 0736  GLUCAP 250* 81 145*        Hypokalemia  -Repleted    HLD -Crestor      Discharge Diagnoses:  Principal Problem:   Acute on chronic diastolic CHF (congestive heart  failure) (HCC) Active Problems:   GERD   S/P CABG x 3   Dyslipidemia   Type 2 diabetes mellitus with vascular disease ([place])   Acute pulmonary edema (HCC)   Atrial fibrillation ([place])   COVID-19 virus infection   04/25/22  Since I last saw the patient, she has lost 8 pounds.  She states that her breathing is much better.  She still has faint crackles in her left base and mild crackles at her right base.  There is no pitting edema in her extremities.  She is currently taking Lasix 40 mg in the morning and 20 mg in the evening.  Her grandson is monitoring her weight on a daily basis.  He has noticed hypoglycemia occasionally in the evening with a blood sugar in the 60s.  She is currently taking 20 units twice a day of insulin Past Medical History:  Diagnosis Date   Acute biliary pancreatitis 07/2002   thia was in 07/2004:she still has pseudocyst in tail of pancreas measuring 54 x 33 mm    Adrenal adenoma    bilateral   Anxiety    CAD (coronary artery disease)    a. s/p CABG in 08/2014 with LIMA-LAD, SVG-OM and SVG-PDA   Chronic pancreatitis ([place])    Detached retina    Diabetes mellitus ([place])    Diverticulosis    Heartburn    History of carcinoma in situ of breast 1988   Hyperlipidemia    Hypertension    IBS (irritable bowel syndrome)    Legally blind    Osteoarthritis    Osteopenia    Upper GI bleed September2004   secondary to gastritis   Past Surgical History:  Procedure Laterality Date   CARDIAC CATHETERIZATION N/A 08/24/2014   Procedure: Left Heart Cath and Coronary Angiography;  Surgeon: Leonie Man, MD;  Location: [facility];  Service: Cardiovascular;  Laterality: N/A;   COLONOSCOPY  08/15/05   few tiny diverticula at sigmoid colon/external hemorrhoids but no polyps   COLONOSCOPY  01/08/2012   [id number] diverticulosis. Next colonoscopy in 12/2016   CORONARY ARTERY BYPASS GRAFT N/A 08/24/2014   Procedure: CORONARY ARTERY BYPASS GRAFTING (CABG) x three, using left  internal mammary artery and right leg greater saphenous vein harvested endoscopically;  Surgeon: Ivin Poot, MD;  Location: [facility];  Service: Open Heart Surgery;  Laterality: N/A;   ECTOPIC  PREGNANCY SURGERY  1950's   ERCP with sphincterotomy  07/2002   ESOPHAGOGASTRODUODENOSCOPY      Gastritis of body.  Otherwise normal   ESOPHAGOGASTRODUODENOSCOPY  01/08/2012   RMR: Few scattered gastric erosions of uncertain significance-status post biopsy. Minimal chronic inflammation, no H.pylori   LAPAROSCOPIC CHOLECYSTECTOMY     MASTECTOMY Right 1980   PANCREATIC PSEUDOCYST DRAINAGE  [id number]   drained percutaneously    right mastectomy     tacking up of her bladder     TEE WITHOUT CARDIOVERSION N/A 08/24/2014   Procedure: TRANSESOPHAGEAL ECHOCARDIOGRAM (TEE);  Surgeon: Ivin Poot, MD;  Location: [facility];  Service: Open Heart Surgery;  Laterality: N/A;   Current Outpatient Medications on File Prior to Visit  Medication Sig Dispense Refill   amiodarone (PACERONE) 200 MG tablet Take 1 tablet (200 mg total) by mouth daily. 60 tablet 3   amLODipine (NORVASC) 5 MG tablet TAKE 1 TABLET BY MOUTH ONCE DAILY . APPOINTMENT REQUIRED FOR FUTURE REFILLS (Patient taking differently: Take 5 mg by mouth every evening.) 90 tablet 1   apixaban (ELIQUIS) 5 MG TABS tablet Take 1 tablet (5 mg total) by mouth 2 (two) times daily. 60 tablet 3   BD INSULIN SYRINGE U/F 31G X 5/16" 0.5 ML MISC USE AS DIRECTED TWICE DAILY 200 each 0   Blood Glucose Monitoring Suppl (BLOOD GLUCOSE SYSTEM PAK) KIT Please dispense as One Touch Ultra. Use as directed to monitor FSBS 4x daily. Dx: E11.65 1 kit 1   calcium carbonate (TUMS - DOSED IN MG ELEMENTAL CALCIUM) 500 MG chewable tablet Chew 3 tablets by mouth as needed for heartburn or indigestion.     cetirizine (EQ ALLERGY RELIEF, CETIRIZINE,) 10 MG tablet Take 1 tablet (10 mg total) by mouth at bedtime.     Continuous Blood Gluc Sensor (DEXCOM G7 SENSOR) MISC 1 Device by Does not apply  route every 14 (fourteen) days. 3 each 11   CREON 24000-76000 units CPEP Take 1 capsule (24,000 Units total) by mouth See admin instructions. TAKE 2 CAPSULES BY MOUTH WITH MEALS AND 1 CAP WITH SNACKS x 2     famotidine (PEPCID) 40 MG tablet Take 1 tablet (40 mg total) by mouth daily. (Patient taking differently: Take 40 mg by mouth daily at 12 noon.) 30 tablet 3   ferrous sulfate 325 (65 FE) MG tablet Take 325 mg by mouth daily with breakfast.     FLUoxetine (PROZAC) 20 MG capsule Take 1 capsule (20 mg total) by mouth at bedtime.     furosemide (LASIX) 20 MG tablet Take 1 tablet (20 mg total) by mouth See admin instructions. Take 40 mg in the morning and 20 mg at 4 pm.     gabapentin (NEURONTIN) 100 MG capsule TAKE 1 CAPSULE BY MOUTH THREE TIMES DAILY AS NEEDED FOR NERVE PAIN (Patient taking differently: Take 100 mg by mouth 3 (three) times daily as needed (nerve pain).) 90 capsule 0   insulin NPH-regular Human (NOVOLIN 70/30 RELION) (70-30) 100 UNIT/ML injection Inject 20-24 Units into the skin See admin instructions. INJECT 24 UNITS SUBCUTANEOUSLY IN THE MORNING AND 20 IN THE EVENING ( Sliding Scale) Checks 2 hours after pt eats.     Lancets Misc. MISC Pt has Accu Chek Aviva Plus - Needs just lancets  Checks BS 4-5 times per day. Disp 3 boxes(90 day Supply)/4 refills Dx code. E11.9 450 each 3   LORazepam (ATIVAN) 1 MG tablet Take 0.5-1 tablets (0.5-1 mg total) by mouth 2 (two) times  daily as needed for anxiety or sleep. 1 tablet at bedtime.     MAGNESIUM-OXIDE 400 (240 Mg) MG tablet TAKE 1 TABLET BY MOUTH ONCE DAILY . APPOINTMENT REQUIRED FOR FUTURE REFILLS (Patient taking differently: Take 400 mg by mouth daily. Lunch time) 30 tablet 6   metoprolol tartrate (LOPRESSOR) 50 MG tablet Take 1 tablet (50 mg total) by mouth 2 (two) times daily. 60 tablet 3   Multiple Vitamin (MULTIVITAMIN) capsule Take 1 capsule by mouth every morning.      OneTouch Delica Lancets [id number] MISC CHECK BLOOD SUGAR 4-5 TIMES PER  DAY 300 each 0   ONETOUCH ULTRA test strip USE 1 STRIP TO CHECK GLUCOSE FIVE TIMES DAILY 300 each 0   pantoprazole (PROTONIX) 40 MG tablet Take 1 tablet (40 mg total) by mouth daily. 90 tablet 3   potassium chloride (KLOR-CON) 10 MEQ tablet Take 1 tablet (10 mEq total) by mouth daily. 90 tablet 3   Propylene Glycol-Glycerin (SOOTHE OP) Apply 2 drops to eye 2 (two) times daily.     rosuvastatin (CRESTOR) 5 MG tablet Take 1 tablet by mouth once daily (Patient taking differently: Take 5 mg by mouth every evening.) 90 tablet 3   triamcinolone cream (KENALOG) 0.1 % Apply topically 2 (two) times daily. 60 g 0   No current facility-administered medications on file prior to visit.   Allergies  Allergen Reactions   Calcium-Containing Compounds Nausea Only   Morphine And Related Other (See Comments)    Hallucination   Raloxifene Itching    Evista- Face and eyes burning   Vitamin D Analogs Nausea Only   Social History   Socioeconomic History   Marital status: Widowed    Spouse name: Not on file   Number of children: 5   Years of education: Not on file   Highest education level: Not on file  Occupational History   Occupation: homemaker  Tobacco Use   Smoking status: Never   Smokeless tobacco: Never  Vaping Use   Vaping Use: Never used  Substance and Sexual Activity   Alcohol use: No    Alcohol/week: 0.0 standard drinks of alcohol   Drug use: No   Sexual activity: Not Currently    Birth control/protection: Post-menopausal  Other Topics Concern   Not on file  Social History Narrative   Lives in [place]   Social Determinants of Health   Financial Resource Strain: Not on file  Food Insecurity: No Food Insecurity (04/15/2022)   Hunger Vital Sign    Worried About Running Out of Food in the Last Year: Never true    Ran Out of Food in the Last Year: Never true  Transportation Needs: No Transportation Needs (04/15/2022)   PRAPARE - Hydrologist  (Medical): No    Lack of Transportation (Non-Medical): No  Physical Activity: Not on file  Stress: Not on file  Social Connections: Not on file  Intimate Partner Violence: Not At Risk (04/15/2022)   Humiliation, Afraid, Rape, and Kick questionnaire    Fear of Current or Ex-Partner: No    Emotionally Abused: No    Physically Abused: No    Sexually Abused: No          Review of Systems  Musculoskeletal:  Positive for back pain.  Skin:  Positive for rash.  All other systems reviewed and are negative.      Objective:   Physical Exam Constitutional:      General: She is not in acute  distress.    Appearance: Normal appearance. She is normal weight. She is not ill-appearing or toxic-appearing.  Cardiovascular:     Rate and Rhythm: Normal rate. Rhythm irregular.     Heart sounds: Normal heart sounds. No murmur heard.    No friction rub. No gallop.  Pulmonary:     Effort: Pulmonary effort is normal. No respiratory distress.     Breath sounds: No stridor. Examination of the right-lower field reveals rales. Rales present. No wheezing or rhonchi.  Chest:     Chest wall: No tenderness.  Abdominal:     General: Abdomen is flat. Bowel sounds are normal. There is no distension.     Palpations: Abdomen is soft. There is no mass.     Tenderness: There is no abdominal tenderness.  Musculoskeletal:     Right lower leg: No edema.     Left lower leg: No edema.  Skin:    Findings: No erythema or rash.  Neurological:     General: No focal deficit present.     Mental Status: She is alert and oriented to person, place, and time. Mental status is at baseline.     Cranial Nerves: No cranial nerve deficit.     Sensory: No sensory deficit.     Motor: Weakness present.     Coordination: Coordination normal.           Assessment & Plan:  Acute congestive heart failure, unspecified heart failure type ([place]) - Plan: CBC with Differential/Platelet, BASIC METABOLIC PANEL WITH GFR I  recommended increasing Lasix to 40 mg twice daily.  Have asked this grandson to monitor her weight daily.  If she gains more than 2 pounds in a day we need to increase her diuretic.  If she loses more than 2 pounds in a day we need to decrease her diuretic.  He is aware of this.  Check her kidney function and her potassium today.  Decrease her insulin to 15 units twice daily to avoid hypoglycemia.

## 2022-04-26 LAB — CBC WITH DIFFERENTIAL/PLATELET
Absolute Monocytes: 490 cells/uL (ref 200–950)
Basophils Absolute: 21 cells/uL (ref 0–200)
Basophils Relative: 0.3 %
Eosinophils Absolute: 57 cells/uL (ref 15–500)
Eosinophils Relative: 0.8 %
HCT: 37.2 % (ref 35.0–45.0)
Hemoglobin: 11.6 g/dL — ABNORMAL LOW (ref 11.7–15.5)
Lymphs Abs: 1803 cells/uL (ref 850–3900)
MCH: 26 pg — ABNORMAL LOW (ref 27.0–33.0)
MCHC: 31.2 g/dL — ABNORMAL LOW (ref 32.0–36.0)
MCV: 83.4 fL (ref 80.0–100.0)
MPV: 11.6 fL (ref 7.5–12.5)
Monocytes Relative: 6.9 %
Neutro Abs: 4729 cells/uL (ref 1500–7800)
Neutrophils Relative %: 66.6 %
Platelets: 213 10*3/uL (ref 140–400)
RBC: 4.46 10*6/uL (ref 3.80–5.10)
RDW: 14.9 % (ref 11.0–15.0)
Total Lymphocyte: 25.4 %
WBC: 7.1 10*3/uL (ref 3.8–10.8)

## 2022-04-26 LAB — BASIC METABOLIC PANEL WITH GFR
BUN/Creatinine Ratio: 33 (calc) — ABNORMAL HIGH (ref 6–22)
BUN: 57 mg/dL — ABNORMAL HIGH (ref 7–25)
CO2: 33 mmol/L — ABNORMAL HIGH (ref 20–32)
Calcium: 9.1 mg/dL (ref 8.6–10.4)
Chloride: 95 mmol/L — ABNORMAL LOW (ref 98–110)
Creat: 1.72 mg/dL — ABNORMAL HIGH (ref 0.60–0.95)
Glucose, Bld: 152 mg/dL — ABNORMAL HIGH (ref 65–99)
Potassium: 4.4 mmol/L (ref 3.5–5.3)
Sodium: 140 mmol/L (ref 135–146)
eGFR: 27 mL/min/{1.73_m2} — ABNORMAL LOW (ref >=60)

## 2022-04-27 ENCOUNTER — Other Ambulatory Visit: Payer: Self-pay | Admitting: Family Medicine

## 2022-04-28 NOTE — Telephone Encounter (Signed)
Unable to refill per protocol, Rx request is too soon. Last refill 03/31/22 for 90 and 3 refills.  Requested Prescriptions  Pending Prescriptions Disp Refills   potassium chloride (KLOR-CON) 10 MEQ tablet [Pharmacy Med Name: Potassium Chloride ER 10 MEQ Oral Tablet Extended Release] 90 tablet 0    Sig: TAKE 1 TABLET BY MOUTH ONCE DAILY AS NEEDED -  TAKE  WITH  LASIX  (FUROSEMIDE)     Endocrinology:  Minerals - Potassium Supplementation Failed - 04/27/2022  7:59 PM      Failed - Cr in normal range and within 360 days    Creat  Date Value Ref Range Status  04/25/2022 1.72 (H) 0.60 - 0.95 mg/dL Final         Passed - K in normal range and within 360 days    Potassium  Date Value Ref Range Status  04/25/2022 4.4 3.5 - 5.3 mmol/L Final  10/15/2011 4.5 mmol/L Final         Passed - Valid encounter within last 12 months    Recent Outpatient Visits           10 months ago Type 2 diabetes mellitus with other specified complication, with long-term current use of insulin ([place])   [place] Susy Frizzle, MD   1 year ago Type 2 diabetes mellitus with other specified complication, with long-term current use of insulin ([place])   [place] Susy Frizzle, MD   2 years ago Type 2 diabetes mellitus with other specified complication, with long-term current use of insulin ([place])   [place] Pickard, Cammie Mcgee, MD   2 years ago Hospital discharge follow-up   [place], Warren T, MD   3 years ago Acute pain of both knees   [place] Pickard, Cammie Mcgee, MD       Future Appointments             In 2 weeks Kathlen Mody, Cadence H, PA-C [facility] at [facility], [place]   In 1 month Branch, Alphonse Guild, MD [facility] at [facility], [place]   In 1 month Pickard, Cammie Mcgee, MD [place], [place]

## 2022-05-01 ENCOUNTER — Other Ambulatory Visit: Payer: Self-pay

## 2022-05-04 ENCOUNTER — Other Ambulatory Visit: Payer: Self-pay | Admitting: Family Medicine

## 2022-05-07 ENCOUNTER — Telehealth: Payer: Self-pay

## 2022-05-07 ENCOUNTER — Other Ambulatory Visit: Payer: PPO

## 2022-05-07 DIAGNOSIS — I509 Heart failure, unspecified: Secondary | ICD-10-CM

## 2022-05-07 DIAGNOSIS — N289 Disorder of kidney and ureter, unspecified: Secondary | ICD-10-CM

## 2022-05-07 NOTE — Telephone Encounter (Signed)
Dexcom 7 order submitted to [employer], DME company through [employer]. Pt's grandson, Legrand Como advised. Mjp,lpn

## 2022-05-08 LAB — BASIC METABOLIC PANEL
BUN/Creatinine Ratio: 23 (calc) — ABNORMAL HIGH (ref 6–22)
BUN: 32 mg/dL — ABNORMAL HIGH (ref 7–25)
CO2: 32 mmol/L (ref 20–32)
Calcium: 8.7 mg/dL (ref 8.6–10.4)
Chloride: 96 mmol/L — ABNORMAL LOW (ref 98–110)
Creat: 1.38 mg/dL — ABNORMAL HIGH (ref 0.60–0.95)
Glucose, Bld: 182 mg/dL — ABNORMAL HIGH (ref 65–99)
Potassium: 4.1 mmol/L (ref 3.5–5.3)
Sodium: 136 mmol/L (ref 135–146)

## 2022-05-09 ENCOUNTER — Other Ambulatory Visit: Payer: Self-pay | Admitting: Family Medicine

## 2022-05-12 ENCOUNTER — Telehealth: Payer: Self-pay | Admitting: Family Medicine

## 2022-05-12 NOTE — Telephone Encounter (Signed)
Patient's daughter [name] called to request lab results.  Also concerned about her mother gaining weight. Patient recently prescribed a water pill. Daughter requesting for provider's advice. Considering patient's medical conditions/concerns, [name]  wants to know if it's ok for the patient to continue taking the recently prescribed medication to pull off fluid (prescribed only 2 pills recently; daughter unsure of name of medication). If so, requesting for provider to send in a refill.  Pharmacy confirmed as   [facility] [address], [place] - [place] [address] [place], [place] [address] Phone: [phone]  Fax: [phone]   Please advise Debra at [phone]

## 2022-05-14 ENCOUNTER — Encounter: Payer: Self-pay | Admitting: Medical

## 2022-05-14 ENCOUNTER — Ambulatory Visit: Payer: PPO | Attending: Medical | Admitting: Medical

## 2022-05-14 VITALS — BP 130/62 | HR 89 | Ht 63.0 in | Wt 144.8 lb

## 2022-05-14 DIAGNOSIS — I4891 Unspecified atrial fibrillation: Secondary | ICD-10-CM | POA: Diagnosis not present

## 2022-05-14 DIAGNOSIS — E782 Mixed hyperlipidemia: Secondary | ICD-10-CM

## 2022-05-14 DIAGNOSIS — I5032 Chronic diastolic (congestive) heart failure: Secondary | ICD-10-CM

## 2022-05-14 DIAGNOSIS — I2510 Atherosclerotic heart disease of native coronary artery without angina pectoris: Secondary | ICD-10-CM

## 2022-05-14 DIAGNOSIS — I059 Rheumatic mitral valve disease, unspecified: Secondary | ICD-10-CM

## 2022-05-14 NOTE — Patient Instructions (Signed)
Medication Instructions:  Your physician recommends that you continue on your current medications as directed. Please refer to the Current Medication list given to you today.  *If you need a refill on your cardiac medications before your next appointment, please call your pharmacy*   Lab Work: Your physician recommends that you return for lab work in: Today   If you have labs (blood work) drawn today and your tests are completely normal, you will receive your results only by: MyChart Message (if you have MyChart) OR A paper copy in the mail If you have any lab test that is abnormal or we need to change your treatment, we will call you to review the results.   Testing/Procedures: NONE    Follow-Up: At [facility], you and your health needs are our priority.  As part of our continuing mission to provide you with exceptional heart care, we have created designated Provider Care Teams.  These Care Teams include your primary Cardiologist (physician) and Advanced Practice Providers (APPs -  Physician Assistants and Nurse Practitioners) who all work together to provide you with the care you need, when you need it.  We recommend signing up for the patient portal called "MyChart".  Sign up information is provided on this After Visit Summary.  MyChart is used to connect with patients for Virtual Visits (Telemedicine).  Patients are able to view lab/test results, encounter notes, upcoming appointments, etc.  Non-urgent messages can be sent to your provider as well.   To learn more about what you can do with MyChart, go to [email or web address].    Your next appointment:   As Scheduled    Provider:   Carlyle Dolly, MD    Other Instructions Thank you for choosing [facility]!

## 2022-05-14 NOTE — Progress Notes (Signed)
Cardiology Office Note:    Date:  05/14/2022   ID:  Misty Edwards, DOB February 04, 1927, MRN [id number]  PCP:  Susy Frizzle, MD  [facility] Cardiologist:  Carlyle Dolly, MD  [facility] Electrophysiologist:  None   Referring MD: Susy Frizzle, MD   Chief Complaint: Hospital follow-up  History of Present Illness:    Misty Edwards is a 89 y.o. female with a hx of ED status post emergent CABG in 2016 (LIMA to LAD, SVG to OM and SVG to PDA), persistent A-fib, diastolic heart failure, chronic hypoxic respiratory failure on home oxygen, hypertension, hyperlipidemia, diabetes type 2 who presents for hospital follow-up.  Echo from December 2023 showed LVEF 60 to 65%, moderate asymmetric LVH, mildly reduced RV function, moderately dilated left atrium, moderate mitral regurgitation.  She was admitted in the end of January with acute on chronic hypoxic respiratory failure secondary to COVID-19 infection and acute on chronic diastolic heart failure.  Patient was treated with IV Lasix to Lasix 40 mg daily.  Was a question regarding Eliquis.  It had been held for fall risk however the family wanted to continue it due to stroke risk.  Today, the patient is doing better. She is using Eliquis since the Family feels is necessary to reduce stroke risk.  They deny any recent falls.  She uses a walker at all times and always has another adult present.  Patient is euvolemic on exam.  She is taking lasix 40 in the am and '20mg'$  in the night. They weigh her every morning, baseline weight around 140lbs. She is eating low salt diet. Also eating healthier in general. She is also drinking 50 ozs.  On 1.5-2L O2. Diastolic heart failure discussed in depth today.   Past Medical History:  Diagnosis Date   Acute biliary pancreatitis 07/2002   thia was in 07/2004:she still has pseudocyst in tail of pancreas measuring 54 x 33 mm    Adrenal adenoma    bilateral   Anxiety    CAD (coronary artery disease)     a. s/p CABG in 08/2014 with LIMA-LAD, SVG-OM and SVG-PDA   Chronic pancreatitis ([place])    Detached retina    Diabetes mellitus ([place])    Diverticulosis    Heartburn    History of carcinoma in situ of breast 1988   Hyperlipidemia    Hypertension    IBS (irritable bowel syndrome)    Legally blind    Osteoarthritis    Osteopenia    Upper GI bleed September2004   secondary to gastritis    Past Surgical History:  Procedure Laterality Date   CARDIAC CATHETERIZATION N/A 08/24/2014   Procedure: Left Heart Cath and Coronary Angiography;  Surgeon: Leonie Man, MD;  Location: [place] [facility];  Service: Cardiovascular;  Laterality: N/A;   COLONOSCOPY  08/15/05   few tiny diverticula at sigmoid colon/external hemorrhoids but no polyps   COLONOSCOPY  01/08/2012   [id number] diverticulosis. Next colonoscopy in 12/2016   CORONARY ARTERY BYPASS GRAFT N/A 08/24/2014   Procedure: CORONARY ARTERY BYPASS GRAFTING (CABG) x three, using left internal mammary artery and right leg greater saphenous vein harvested endoscopically;  Surgeon: Ivin Poot, MD;  Location: [place];  Service: Open Heart Surgery;  Laterality: N/A;   ECTOPIC PREGNANCY SURGERY  1950's   ERCP with sphincterotomy  07/2002   ESOPHAGOGASTRODUODENOSCOPY      Gastritis of body.  Otherwise normal   ESOPHAGOGASTRODUODENOSCOPY  01/08/2012   RMR: Few scattered gastric  erosions of uncertain significance-status post biopsy. Minimal chronic inflammation, no H.pylori   LAPAROSCOPIC CHOLECYSTECTOMY     MASTECTOMY Right 1980   PANCREATIC PSEUDOCYST DRAINAGE  [id number]   drained percutaneously    right mastectomy     tacking up of her bladder     TEE WITHOUT CARDIOVERSION N/A 08/24/2014   Procedure: TRANSESOPHAGEAL ECHOCARDIOGRAM (TEE);  Surgeon: Ivin Poot, MD;  Location: [place];  Service: Open Heart Surgery;  Laterality: N/A;    Current Medications: Current Meds  Medication Sig   amiodarone (PACERONE) 200 MG tablet Take 1 tablet (200  mg total) by mouth daily.   amLODipine (NORVASC) 5 MG tablet TAKE 1 TABLET BY MOUTH ONCE DAILY . APPOINTMENT REQUIRED FOR FUTURE REFILLS (Patient taking differently: Take 5 mg by mouth every evening.)   apixaban (ELIQUIS) 5 MG TABS tablet Take 1 tablet (5 mg total) by mouth 2 (two) times daily.   BD INSULIN SYRINGE U/F 31G X 5/16" 0.5 ML MISC USE AS DIRECTED TWICE DAILY   Blood Glucose Monitoring Suppl (BLOOD GLUCOSE SYSTEM PAK) KIT Please dispense as One Touch Ultra. Use as directed to monitor FSBS 4x daily. Dx: E11.65   calcium carbonate (TUMS - DOSED IN MG ELEMENTAL CALCIUM) 500 MG chewable tablet Chew 3 tablets by mouth as needed for heartburn or indigestion.   cetirizine (EQ ALLERGY RELIEF, CETIRIZINE,) 10 MG tablet Take 1 tablet (10 mg total) by mouth at bedtime.   Continuous Blood Gluc Sensor (DEXCOM G7 SENSOR) MISC 1 Device by Does not apply route every 14 (fourteen) days.   CREON 24000-76000 units CPEP Take 1 capsule (24,000 Units total) by mouth See admin instructions. TAKE 2 CAPSULES BY MOUTH WITH MEALS AND 1 CAP WITH SNACKS x 2   famotidine (PEPCID) 40 MG tablet Take 1 tablet (40 mg total) by mouth daily. (Patient taking differently: Take 40 mg by mouth daily at 12 noon.)   ferrous sulfate 325 (65 FE) MG tablet Take 325 mg by mouth daily with breakfast.   FLUoxetine (PROZAC) 20 MG capsule Take 1 capsule (20 mg total) by mouth at bedtime.   furosemide (LASIX) 20 MG tablet Take 1 tablet (20 mg total) by mouth See admin instructions. Take 40 mg in the morning and 20 mg at 4 pm.   gabapentin (NEURONTIN) 100 MG capsule TAKE 1 CAPSULE BY MOUTH THREE TIMES DAILY AS NEEDED FOR NERVE PAIN (Patient taking differently: Take 100 mg by mouth 3 (three) times daily as needed (nerve pain).)   insulin NPH-regular Human (NOVOLIN 70/30 RELION) (70-30) 100 UNIT/ML injection Inject 20-24 Units into the skin See admin instructions. INJECT 24 UNITS SUBCUTANEOUSLY IN THE MORNING AND 20 IN THE EVENING ( Sliding  Scale) Checks 2 hours after pt eats.   Lancets Misc. MISC Pt has Accu Chek Aviva Plus - Needs just lancets  Checks BS 4-5 times per day. Disp 3 boxes(90 day Supply)/4 refills Dx code. E11.9   LORazepam (ATIVAN) 1 MG tablet TAKE 1/2 TO 1 (ONE-HALF TO ONE) TABLET BY MOUTH TWICE DAILY AS NEEDED   MAGNESIUM-OXIDE 400 (240 Mg) MG tablet TAKE 1 TABLET BY MOUTH ONCE DAILY . APPOINTMENT REQUIRED FOR FUTURE REFILLS (Patient taking differently: Take 400 mg by mouth daily. Lunch time)   metoprolol tartrate (LOPRESSOR) 50 MG tablet Take 1 tablet (50 mg total) by mouth 2 (two) times daily.   Multiple Vitamin (MULTIVITAMIN) capsule Take 1 capsule by mouth every morning.    OneTouch Delica Lancets 99991111 MISC CHECK BLOOD SUGAR 4-5 TIMES  PER DAY   ONETOUCH ULTRA test strip USE 1 STRIP TO CHECK GLUCOSE FIVE TIMES DAILY   pantoprazole (PROTONIX) 40 MG tablet Take 1 tablet (40 mg total) by mouth daily.   potassium chloride (KLOR-CON) 10 MEQ tablet Take 1 tablet (10 mEq total) by mouth daily.   Propylene Glycol-Glycerin (SOOTHE OP) Apply 2 drops to eye 2 (two) times daily.   rosuvastatin (CRESTOR) 5 MG tablet Take 1 tablet by mouth once daily (Patient taking differently: Take 5 mg by mouth every evening.)   triamcinolone cream (KENALOG) 0.1 % Apply topically 2 (two) times daily.     Allergies:   Calcium-containing compounds, Morphine and related, Raloxifene, and Vitamin d analogs   Social History   Socioeconomic History   Marital status: Widowed    Spouse name: Not on file   Number of children: 5   Years of education: Not on file   Highest education level: Not on file  Occupational History   Occupation: homemaker  Tobacco Use   Smoking status: Never   Smokeless tobacco: Never  Vaping Use   Vaping Use: Never used  Substance and Sexual Activity   Alcohol use: No    Alcohol/week: 0.0 standard drinks of alcohol   Drug use: No   Sexual activity: Not Currently    Birth control/protection: Post-menopausal   Other Topics Concern   Not on file  Social History Narrative   Lives in [place].   Social Determinants of Health   Financial Resource Strain: Not on file  Food Insecurity: No Food Insecurity (04/15/2022)   Hunger Vital Sign    Worried About Running Out of Food in the Last Year: Never true    Ran Out of Food in the Last Year: Never true  Transportation Needs: No Transportation Needs (04/15/2022)   PRAPARE - Hydrologist (Medical): No    Lack of Transportation (Non-Medical): No  Physical Activity: Not on file  Stress: Not on file  Social Connections: Not on file     Family History: The patient's family history includes Colon cancer (age of onset: 97) in her brother; Colon cancer (age of onset: 71) in her father; Heart attack in her brother; Kidney disease in her daughter; Leukemia in her son; Ovarian cancer in her mother; Pancreatitis in her brother.  ROS:   Please see the history of present illness.     All other systems reviewed and are negative.  EKGs/Labs/Other Studies Reviewed:    The following studies were reviewed today:  Echo 02/2022 1. Left ventricular ejection fraction, by estimation, is 60 to 65%. The  left ventricle has normal function. The left ventricle demonstrates  regional wall motion abnormalities (see scoring diagram/findings for  description). There is moderate asymmetric  left ventricular hypertrophy of the basal-septal segment. Left ventricular  diastolic parameters are indeterminate.   2. Right ventricular systolic function is mildly reduced. The right  ventricular size is normal. There is normal pulmonary artery systolic  pressure. The estimated right ventricular systolic pressure is 99991111 mmHg.   3. Left atrial size was moderately dilated.   4. At least moderate mitral regurgitation with horizontal color splay and  with Doppler evidnece of right sided pulmonary vein flow reversal. The  mitral valve is normal in  structure. Moderate mitral valve regurgitation.   5. The aortic valve is tricuspid. Aortic valve regurgitation is trivial.  No aortic stenosis is present.   Comparison(s): Mitral regurgitation has increased.   EKG:  EKG is ordered  today.  The ekg ordered today demonstrates Afib, 70bpm  Recent Labs: 03/01/2022: TSH 0.673 04/17/2022: ALT 73; B Natriuretic Peptide 512.0 04/18/2022: Magnesium 2.5 04/25/2022: Hemoglobin 11.6; Platelets 213 05/07/2022: BUN 32; Creat 1.38; Potassium 4.1; Sodium 136  Recent Lipid Panel    Component Value Date/Time   CHOL 88 03/28/2022 0815   TRIG 68 03/28/2022 0815   TRIG 90 10/15/2011 1538   HDL 51 03/28/2022 0815   CHOLHDL 1.7 03/28/2022 0815   VLDL 20 11/29/2021 0908   LDLCALC 23 03/28/2022 0815     Physical Exam:    VS:  BP 130/62   Pulse 89   Ht '5\' 3"'$  (1.6 m)   Wt 144 lb 12.8 oz (65.7 kg)   SpO2 97%   BMI 25.65 kg/m     Wt Readings from Last 3 Encounters:  05/14/22 144 lb 12.8 oz (65.7 kg)  04/25/22 142 lb 6.4 oz (64.6 kg)  04/18/22 139 lb 12.4 oz (63.4 kg)     GEN:  Well nourished, well developed in no acute distress HEENT: Normal NECK: No JVD; No carotid bruits LYMPHATICS: No lymphadenopathy CARDIAC: RRR, no murmurs, rubs, gallops RESPIRATORY:  Clear to auscultation without rales, wheezing or rhonchi  ABDOMEN: Soft, non-tender, non-distended MUSCULOSKELETAL:  No edema; No deformity  SKIN: Warm and dry NEUROLOGIC:  Alert and oriented x 3 PSYCHIATRIC:  Normal affect   ASSESSMENT:    1. Chronic diastolic heart failure ([place])   2. Atrial fibrillation, unspecified type ([place])   3. Coronary artery disease involving native coronary artery of native heart without angina pectoris   4. Hyperlipidemia, mixed   5. Mitral valve disease    PLAN:    In order of problems listed above:  Chronic diastolic heart failure Recent admission January 2024 for heart failure,  she had 2 admissions in December for heart failure. She has been taking  Lasix 40 mg in the a.m. and 20 mg in the p.m. Baseline weight around 140 pounds.The patient is following low-salt diet and limiting fluid intake. The patient appears euvolemic on exam.Continue metoprolol 50 mg twice daily. I will check a BMET today.  Persistent atrial fibrillation In the recent hospitalization, there was a question as to whether Eliquis should be continued due to fall risk. At discharge, the family insisted on continuing anticoagulation for stroke risk, sp the patient has been taking Eliquis 5 mg twice daily. EKG today shows rate controlled atrial fibrillation.  Family denies any recent falls and states a family member is with the patient at all times.  Continue rate control with metoprolol.  Patient was also started on amiodarone during the first hospitalization in December.  Unsure this will be continued since patient remains in A-fib.  Can be addressed at follow-up with MD.  CAD status post CABG in 2016 No chest pain reported.  No aspirin given Eliquis. Continue beta-blocker therapy and Crestor. No further ischemic workup indicated at this time.  HLD LDL 23.  Continue Crestor 5 mg daily.  Valvular heart disease Moderate MR per echo. We will continued to follow this with serial echocardiograms.   Disposition: Follow up 1 month with MD   Signed, Jakyra Kenealy Ninfa Meeker, PA-C  05/14/2022 4:19 PM    [facility]

## 2022-05-15 LAB — BASIC METABOLIC PANEL
BUN/Creatinine Ratio: 19 (ref 12–28)
BUN: 27 mg/dL (ref 10–36)
CO2: 30 mmol/L — ABNORMAL HIGH (ref 20–29)
Calcium: 9.2 mg/dL (ref 8.7–10.3)
Chloride: 95 mmol/L — ABNORMAL LOW (ref 96–106)
Creatinine, Ser: 1.39 mg/dL — ABNORMAL HIGH (ref 0.57–1.00)
Glucose: 171 mg/dL — ABNORMAL HIGH (ref 70–99)
Potassium: 4.5 mmol/L (ref 3.5–5.2)
Sodium: 137 mmol/L (ref 134–144)
eGFR: 35 mL/min/{1.73_m2} — ABNORMAL LOW (ref >59)

## 2022-05-16 ENCOUNTER — Telehealth: Payer: Self-pay | Admitting: Family Medicine

## 2022-05-16 ENCOUNTER — Other Ambulatory Visit: Payer: Self-pay

## 2022-05-16 ENCOUNTER — Ambulatory Visit (HOSPITAL_BASED_OUTPATIENT_CLINIC_OR_DEPARTMENT_OTHER): Payer: PPO | Admitting: Family

## 2022-05-16 DIAGNOSIS — Z794 Long term (current) use of insulin: Secondary | ICD-10-CM

## 2022-05-16 DIAGNOSIS — N289 Disorder of kidney and ureter, unspecified: Secondary | ICD-10-CM

## 2022-05-16 DIAGNOSIS — N1832 Chronic kidney disease, stage 3b: Secondary | ICD-10-CM

## 2022-05-16 MED ORDER — FREESTYLE LIBRE 3 SENSOR MISC: 5 refills | Status: DC

## 2022-05-16 MED ORDER — FREESTYLE LIBRE 3 READER DEVI: 1.0000 | Freq: Every day | 1 refills | Status: AC

## 2022-05-16 NOTE — Telephone Encounter (Signed)
Patient's daughter [name] called to follow up on the FreeStyle Glucose Monitor; stated it's difficult to get blood out of her.   Pharmacy confirmed as   [facility] [address], [place] - [address] [place], [place] [address] Phone: [phone]  Fax: [phone]   The pharmacy hasn't received a script.  Please advise [name] at [phone].

## 2022-05-19 ENCOUNTER — Other Ambulatory Visit: Payer: Self-pay | Admitting: Family Medicine

## 2022-05-19 DIAGNOSIS — Z794 Long term (current) use of insulin: Secondary | ICD-10-CM

## 2022-05-22 ENCOUNTER — Ambulatory Visit (INDEPENDENT_AMBULATORY_CARE_PROVIDER_SITE_OTHER): Payer: PPO | Admitting: Family Medicine

## 2022-05-22 ENCOUNTER — Encounter: Payer: Self-pay | Admitting: Family Medicine

## 2022-05-22 ENCOUNTER — Ambulatory Visit: Payer: PPO | Admitting: Nurse Practitioner

## 2022-05-22 VITALS — BP 110/56 | HR 79 | Temp 97.6°F | Ht 63.0 in | Wt 146.0 lb

## 2022-05-22 DIAGNOSIS — I509 Heart failure, unspecified: Secondary | ICD-10-CM | POA: Diagnosis not present

## 2022-05-22 NOTE — Progress Notes (Signed)
Subjective:    Patient ID: Misty Edwards, female    DOB: April 02, 1926, 89 y.o.   MRN: [id number]   04/25/22 Since I last saw the patient, she has lost 8 pounds.  She states that her breathing is much better.  She still has faint crackles in her left base and mild crackles at her right base.  There is no pitting edema in her extremities.  She is currently taking Lasix 40 mg in the morning and 20 mg in the evening.  Her grandson is monitoring her weight on a daily basis.  He has noticed hypoglycemia occasionally in the evening with a blood sugar in the 60s.  She is currently taking 20 units twice a day of insulin.  At that time, my plan was:  I recommended increasing Lasix to 40 mg twice daily.  Have asked this grandson to monitor her weight daily.  If she gains more than 2 pounds in a day we need to increase her diuretic.  If she loses more than 2 pounds in a day we need to decrease her diuretic.  He is aware of this.  Check her kidney function and her potassium today.  Decrease her insulin to 15 units twice daily to avoid hypoglycemia.  05/22/22 Recently received a phone call stating that crease that her swelling more.  Therefore we increased her Lasix temporarily to 40 mg twice a day.  Previously she was taking 40 mg in the morning and 20 mg in the evening.  Today on exam there is no substantial edema.  Despite her weight being higher, she appears euvolemic.  Her lungs are clear to auscultation bilaterally.  Of note she is on amlodipine. Past Medical History:  Diagnosis Date   Acute biliary pancreatitis 07/2002   thia was in 07/2004:she still has pseudocyst in tail of pancreas measuring 54 x 33 mm    Adrenal adenoma    bilateral   Anxiety    CAD (coronary artery disease)    a. s/p CABG in 08/2014 with LIMA-LAD, SVG-OM and SVG-PDA   Chronic pancreatitis ([place])    Detached retina    Diabetes mellitus ([place])    Diverticulosis    Heartburn    History of carcinoma in situ of breast 1988    Hyperlipidemia    Hypertension    IBS (irritable bowel syndrome)    Legally blind    Osteoarthritis    Osteopenia    Upper GI bleed September2004   secondary to gastritis   Past Surgical History:  Procedure Laterality Date   CARDIAC CATHETERIZATION N/A 08/24/2014   Procedure: Left Heart Cath and Coronary Angiography;  Surgeon: Leonie Man, MD;  Location: [facility];  Service: Cardiovascular;  Laterality: N/A;   COLONOSCOPY  08/15/05   few tiny diverticula at sigmoid colon/external hemorrhoids but no polyps   COLONOSCOPY  01/08/2012   [id number] diverticulosis. Next colonoscopy in 12/2016   CORONARY ARTERY BYPASS GRAFT N/A 08/24/2014   Procedure: CORONARY ARTERY BYPASS GRAFTING (CABG) x three, using left internal mammary artery and right leg greater saphenous vein harvested endoscopically;  Surgeon: Ivin Poot, MD;  Location: [facility];  Service: Open Heart Surgery;  Laterality: N/A;   ECTOPIC PREGNANCY SURGERY  1950's   ERCP with sphincterotomy  07/2002   ESOPHAGOGASTRODUODENOSCOPY      Gastritis of body.  Otherwise normal   ESOPHAGOGASTRODUODENOSCOPY  01/08/2012   RMR: Few scattered gastric erosions of uncertain significance-status post biopsy. Minimal chronic inflammation, no H.pylori  LAPAROSCOPIC CHOLECYSTECTOMY     MASTECTOMY Right 1980   PANCREATIC PSEUDOCYST DRAINAGE  [id number]   drained percutaneously    right mastectomy     tacking up of her bladder     TEE WITHOUT CARDIOVERSION N/A 08/24/2014   Procedure: TRANSESOPHAGEAL ECHOCARDIOGRAM (TEE);  Surgeon: Ivin Poot, MD;  Location: [facility];  Service: Open Heart Surgery;  Laterality: N/A;   Current Outpatient Medications on File Prior to Visit  Medication Sig Dispense Refill   amiodarone (PACERONE) 200 MG tablet Take 1 tablet (200 mg total) by mouth daily. 60 tablet 3   amLODipine (NORVASC) 5 MG tablet TAKE 1 TABLET BY MOUTH ONCE DAILY . APPOINTMENT REQUIRED FOR FUTURE REFILLS (Patient taking differently: Take 5 mg  by mouth every evening.) 90 tablet 1   apixaban (ELIQUIS) 5 MG TABS tablet Take 1 tablet (5 mg total) by mouth 2 (two) times daily. 60 tablet 3   BD INSULIN SYRINGE U/F 31G X 5/16" 0.5 ML MISC USE AS DIRECTED TWICE DAILY 200 each 0   Blood Glucose Monitoring Suppl (BLOOD GLUCOSE SYSTEM PAK) KIT Please dispense as One Touch Ultra. Use as directed to monitor FSBS 4x daily. Dx: E11.65 1 kit 1   calcium carbonate (TUMS - DOSED IN MG ELEMENTAL CALCIUM) 500 MG chewable tablet Chew 3 tablets by mouth as needed for heartburn or indigestion.     cetirizine (EQ ALLERGY RELIEF, CETIRIZINE,) 10 MG tablet Take 1 tablet (10 mg total) by mouth at bedtime.     Continuous Blood Gluc Receiver (FREESTYLE LIBRE 3 READER) DEVI 1 Device by Does not apply route daily. Use to continuously check blood sugar daily. 1 each 1   Continuous Blood Gluc Sensor (FREESTYLE LIBRE 3 SENSOR) MISC Place 1 sensor on the skin every 14 days. Use to check glucose continuously 2 each 5   CREON 24000-76000 units CPEP Take 1 capsule (24,000 Units total) by mouth See admin instructions. TAKE 2 CAPSULES BY MOUTH WITH MEALS AND 1 CAP WITH SNACKS x 2     famotidine (PEPCID) 40 MG tablet Take 1 tablet (40 mg total) by mouth daily. (Patient taking differently: Take 40 mg by mouth daily at 12 noon.) 30 tablet 3   ferrous sulfate 325 (65 FE) MG tablet Take 325 mg by mouth daily with breakfast.     FLUoxetine (PROZAC) 20 MG capsule Take 1 capsule (20 mg total) by mouth at bedtime.     furosemide (LASIX) 20 MG tablet Take 1 tablet (20 mg total) by mouth See admin instructions. Take 40 mg in the morning and 20 mg at 4 pm.     gabapentin (NEURONTIN) 100 MG capsule TAKE 1 CAPSULE BY MOUTH THREE TIMES DAILY AS NEEDED FOR NERVE PAIN (Patient taking differently: Take 100 mg by mouth 3 (three) times daily as needed (nerve pain).) 90 capsule 0   insulin NPH-regular Human (NOVOLIN 70/30 RELION) (70-30) 100 UNIT/ML injection Inject 20-24 Units into the skin See  admin instructions. INJECT 24 UNITS SUBCUTANEOUSLY IN THE MORNING AND 20 IN THE EVENING ( Sliding Scale) Checks 2 hours after pt eats.     Lancets Misc. MISC Pt has Accu Chek Aviva Plus - Needs just lancets  Checks BS 4-5 times per day. Disp 3 boxes(90 day Supply)/4 refills Dx code. E11.9 450 each 3   LORazepam (ATIVAN) 1 MG tablet TAKE 1/2 TO 1 (ONE-HALF TO ONE) TABLET BY MOUTH TWICE DAILY AS NEEDED 30 tablet 0   MAGNESIUM-OXIDE 400 (240 Mg) MG tablet TAKE  1 TABLET BY MOUTH ONCE DAILY . APPOINTMENT REQUIRED FOR FUTURE REFILLS (Patient taking differently: Take 400 mg by mouth daily. Lunch time) 30 tablet 6   metoprolol tartrate (LOPRESSOR) 50 MG tablet Take 1 tablet (50 mg total) by mouth 2 (two) times daily. 60 tablet 3   Multiple Vitamin (MULTIVITAMIN) capsule Take 1 capsule by mouth every morning.      OneTouch Delica Lancets 99991111 MISC CHECK BLOOD SUGAR 4-5 TIMES PER DAY 300 each 0   ONETOUCH ULTRA test strip USE 1 STRIP TO CHECK GLUCOSE FIVE TIMES DAILY 300 each 0   pantoprazole (PROTONIX) 40 MG tablet Take 1 tablet (40 mg total) by mouth daily. 90 tablet 3   potassium chloride (KLOR-CON) 10 MEQ tablet Take 1 tablet (10 mEq total) by mouth daily. 90 tablet 3   Propylene Glycol-Glycerin (SOOTHE OP) Apply 2 drops to eye 2 (two) times daily.     rosuvastatin (CRESTOR) 5 MG tablet Take 1 tablet by mouth once daily (Patient taking differently: Take 5 mg by mouth every evening.) 90 tablet 3   triamcinolone cream (KENALOG) 0.1 % Apply topically 2 (two) times daily. 60 g 0   No current facility-administered medications on file prior to visit.   Allergies  Allergen Reactions   Calcium-Containing Compounds Nausea Only   Morphine And Related Other (See Comments)    Hallucination   Raloxifene Itching    Evista- Face and eyes burning   Vitamin D Analogs Nausea Only   Social History   Socioeconomic History   Marital status: Widowed    Spouse name: Not on file   Number of children: 5   Years of  education: Not on file   Highest education level: Not on file  Occupational History   Occupation: homemaker  Tobacco Use   Smoking status: Never   Smokeless tobacco: Never  Vaping Use   Vaping Use: Never used  Substance and Sexual Activity   Alcohol use: No    Alcohol/week: 0.0 standard drinks of alcohol   Drug use: No   Sexual activity: Not Currently    Birth control/protection: Post-menopausal  Other Topics Concern   Not on file  Social History Narrative   Lives in [place].   Social Determinants of Health   Financial Resource Strain: Not on file  Food Insecurity: No Food Insecurity (04/15/2022)   Hunger Vital Sign    Worried About Running Out of Food in the Last Year: Never true    Ran Out of Food in the Last Year: Never true  Transportation Needs: No Transportation Needs (04/15/2022)   PRAPARE - Hydrologist (Medical): No    Lack of Transportation (Non-Medical): No  Physical Activity: Not on file  Stress: Not on file  Social Connections: Not on file  Intimate Partner Violence: Not At Risk (04/15/2022)   Humiliation, Afraid, Rape, and Kick questionnaire    Fear of Current or Ex-Partner: No    Emotionally Abused: No    Physically Abused: No    Sexually Abused: No          Review of Systems  Musculoskeletal:  Positive for back pain.  Skin:  Positive for rash.  All other systems reviewed and are negative.      Objective:   Physical Exam Constitutional:      General: She is not in acute distress.    Appearance: Normal appearance. She is normal weight. She is not ill-appearing or toxic-appearing.  Cardiovascular:  Rate and Rhythm: Normal rate. Rhythm irregular.     Heart sounds: Normal heart sounds. No murmur heard.    No friction rub. No gallop.  Pulmonary:     Effort: Pulmonary effort is normal. No respiratory distress.     Breath sounds: No stridor. No wheezing, rhonchi or rales.  Chest:     Chest wall: No tenderness.   Abdominal:     General: Abdomen is flat. Bowel sounds are normal. There is no distension.     Palpations: Abdomen is soft. There is no mass.     Tenderness: There is no abdominal tenderness.  Musculoskeletal:     Right lower leg: No edema.     Left lower leg: No edema.  Skin:    Findings: No erythema or rash.  Neurological:     General: No focal deficit present.     Mental Status: She is alert and oriented to person, place, and time. Mental status is at baseline.     Cranial Nerves: No cranial nerve deficit.     Sensory: No sensory deficit.     Motor: Weakness present.     Coordination: Coordination normal.   Patient does have a large bruise above her right on her forehead.  She recently fell and tripped over her oxygen cord and struck her head.  She denies any headaches or blurry vision.  There is no tenderness to palpation in the area where the hematoma is present.  She also suffered a skin tear to the dorsum of the left forearm.  This is roughly 3 cm x 2 cm.  There is no evidence of any cellulitis        Assessment & Plan:  Congestive heart failure, unspecified HF chronicity, unspecified heart failure type ([place]) - Plan: BASIC METABOLIC PANEL WITH GFR Patient appears euvolemic today.  I will check her renal function.  Decrease her Lasix back to 40 mg in the morning and 20 mg in the evening.  Discontinue amlodipine as her blood pressure is normal today and this may be contributing to swelling.  Of note she is in atrial fibrillation today.

## 2022-05-23 LAB — BASIC METABOLIC PANEL WITH GFR
BUN/Creatinine Ratio: 19 (calc) (ref 6–22)
BUN: 28 mg/dL — ABNORMAL HIGH (ref 7–25)
CO2: 31 mmol/L (ref 20–32)
Calcium: 8.9 mg/dL (ref 8.6–10.4)
Chloride: 97 mmol/L — ABNORMAL LOW (ref 98–110)
Creat: 1.44 mg/dL — ABNORMAL HIGH (ref 0.60–0.95)
Glucose, Bld: 204 mg/dL — ABNORMAL HIGH (ref 65–99)
Potassium: 3.8 mmol/L (ref 3.5–5.3)
Sodium: 138 mmol/L (ref 135–146)
eGFR: 34 mL/min/{1.73_m2} — ABNORMAL LOW (ref >=60)

## 2022-05-26 ENCOUNTER — Other Ambulatory Visit: Payer: Self-pay | Admitting: Family Medicine

## 2022-05-27 ENCOUNTER — Other Ambulatory Visit: Payer: Self-pay | Admitting: Family Medicine

## 2022-05-29 ENCOUNTER — Other Ambulatory Visit: Payer: Self-pay | Admitting: Family Medicine

## 2022-06-09 ENCOUNTER — Encounter: Payer: Self-pay | Admitting: Cardiology

## 2022-06-09 ENCOUNTER — Ambulatory Visit: Payer: PPO | Attending: Cardiology | Admitting: Cardiology

## 2022-06-09 VITALS — BP 110/66 | HR 64 | Wt 150.0 lb

## 2022-06-09 DIAGNOSIS — I10 Essential (primary) hypertension: Secondary | ICD-10-CM | POA: Diagnosis not present

## 2022-06-09 DIAGNOSIS — I2510 Atherosclerotic heart disease of native coronary artery without angina pectoris: Secondary | ICD-10-CM

## 2022-06-09 DIAGNOSIS — I4891 Unspecified atrial fibrillation: Secondary | ICD-10-CM | POA: Diagnosis not present

## 2022-06-09 DIAGNOSIS — I5032 Chronic diastolic (congestive) heart failure: Secondary | ICD-10-CM

## 2022-06-09 NOTE — Progress Notes (Signed)
Clinical Summary Misty Edwards is a 89 y.o.female seen today for follow up of the following medical problems.   1. Chronic HFpEF - admit 02/2022 with volume overload.  -02/2022 echo: LVEF 60-65%, no WMAs, indet diastolic fxn, mild RV dysfunction -some LE edema at times - taking lasix 40mg  in AM, 40mg  in PM. Started that dose 2 weeks ago by pcp - peak 154 lbs, now 149.5. 148.5 lbs at discharge. - repeat labs by pcp Cr spiked to 1.69  - no recent edema.  - on home O2 2 L, off O2 sats down 80s.  - taking lasix 40mg  in AM and 20mg  pm     2. Afib - she is amio, coreg, eliquis. - despite being on amio has remained in afib last few checks.  - no bleeding on eliquis.    3. Chronic hypoxia - on home O2 1-2 L as needed.       4. COVID 19 admission 04/2022      5. LE edema - resolved off norvasc   6. HTN - compliantw with meds - off norvasc due to leg swelling, bp's still look good.     7. CAD - history of emergent CABG June 2016 after presenting with NSTEMI -t echo 06/2015 LVEF 60-65%, grade II diastolic dysfunction - beta blocker dosing limited due to bradycardia.    -no chest pains     8. Hyperlipidemia - Jan 2024 TC 88 HDL 51 TG 68 LDL 23      4. Mitral regurgitation - moderate by echo 06/2015.  - 10/2016 mild MR.  02/2022 mod MR    Past Medical History:  Diagnosis Date   Acute biliary pancreatitis 07/2002   thia was in 07/2004:she still has pseudocyst in tail of pancreas measuring 54 x 33 mm    Adrenal adenoma    bilateral   Anxiety    CAD (coronary artery disease)    a. s/p CABG in 08/2014 with LIMA-LAD, SVG-OM and SVG-PDA   Chronic pancreatitis ([place])    Detached retina    Diabetes mellitus ([place])    Diverticulosis    Heartburn    History of carcinoma in situ of breast 1988   Hyperlipidemia    Hypertension    IBS (irritable bowel syndrome)    Legally blind    Osteoarthritis    Osteopenia    Upper GI bleed September2004   secondary to gastritis      Allergies  Allergen Reactions   Calcium-Containing Compounds Nausea Only   Morphine And Related Other (See Comments)    Hallucination   Raloxifene Itching    Evista- Face and eyes burning   Vitamin D Analogs Nausea Only     Current Outpatient Medications  Medication Sig Dispense Refill   amiodarone (PACERONE) 200 MG tablet Take 1 tablet (200 mg total) by mouth daily. 60 tablet 3   amLODipine (NORVASC) 5 MG tablet TAKE 1 TABLET BY MOUTH ONCE DAILY . APPOINTMENT REQUIRED FOR FUTURE REFILLS (Patient taking differently: Take 5 mg by mouth every evening.) 90 tablet 1   apixaban (ELIQUIS) 5 MG TABS tablet Take 1 tablet (5 mg total) by mouth 2 (two) times daily. 60 tablet 3   BD INSULIN SYRINGE U/F 31G X 5/16" 0.5 ML MISC USE AS DIRECTED TWICE DAILY 200 each 0   Blood Glucose Monitoring Suppl (BLOOD GLUCOSE SYSTEM PAK) KIT Please dispense as One Touch Ultra. Use as directed to monitor FSBS 4x daily. Dx: E11.65 1 kit  1   calcium carbonate (TUMS - DOSED IN MG ELEMENTAL CALCIUM) 500 MG chewable tablet Chew 3 tablets by mouth as needed for heartburn or indigestion.     cetirizine (ZYRTEC) 10 MG tablet Take 1 tablet by mouth once daily 90 tablet 0   Continuous Blood Gluc Receiver (FREESTYLE LIBRE 3 READER) DEVI 1 Device by Does not apply route daily. Use to continuously check blood sugar daily. 1 each 1   Continuous Blood Gluc Sensor (FREESTYLE LIBRE 3 SENSOR) MISC Place 1 sensor on the skin every 14 days. Use to check glucose continuously 2 each 5   CREON 24000-76000 units CPEP Take 1 capsule (24,000 Units total) by mouth See admin instructions. TAKE 2 CAPSULES BY MOUTH WITH MEALS AND 1 CAP WITH SNACKS x 2     famotidine (PEPCID) 40 MG tablet Take 1 tablet (40 mg total) by mouth daily. (Patient taking differently: Take 40 mg by mouth daily at 12 noon.) 30 tablet 3   ferrous sulfate 325 (65 FE) MG tablet Take 325 mg by mouth daily with breakfast.     FLUoxetine (PROZAC) 20 MG capsule Take 1  capsule (20 mg total) by mouth at bedtime.     furosemide (LASIX) 20 MG tablet Take 1 tablet (20 mg total) by mouth See admin instructions. Take 40 mg in the morning and 20 mg at 4 pm.     gabapentin (NEURONTIN) 100 MG capsule TAKE 1 CAPSULE BY MOUTH THREE TIMES DAILY AS NEEDED FOR  NERVE  PAIN 90 capsule 0   insulin NPH-regular Human (NOVOLIN 70/30 RELION) (70-30) 100 UNIT/ML injection Inject 20-24 Units into the skin See admin instructions. INJECT 24 UNITS SUBCUTANEOUSLY IN THE MORNING AND 20 IN THE EVENING ( Sliding Scale) Checks 2 hours after pt eats.     Lancets Misc. MISC Pt has Accu Chek Aviva Plus - Needs just lancets  Checks BS 4-5 times per day. Disp 3 boxes(90 day Supply)/4 refills Dx code. E11.9 450 each 3   LORazepam (ATIVAN) 1 MG tablet TAKE 1/2 TO 1 (ONE-HALF TO ONE) TABLET BY MOUTH TWICE DAILY AS NEEDED 30 tablet 0   MAGNESIUM-OXIDE 400 (240 Mg) MG tablet TAKE 1 TABLET BY MOUTH ONCE DAILY . APPOINTMENT REQUIRED FOR FUTURE REFILLS (Patient taking differently: Take 400 mg by mouth daily. Lunch time) 30 tablet 6   metoprolol tartrate (LOPRESSOR) 50 MG tablet Take 1 tablet (50 mg total) by mouth 2 (two) times daily. 60 tablet 3   Multiple Vitamin (MULTIVITAMIN) capsule Take 1 capsule by mouth every morning.      OneTouch Delica Lancets 99991111 MISC CHECK BLOOD SUGAR 4-5 TIMES PER DAY 300 each 0   ONETOUCH ULTRA test strip USE 1 STRIP TO CHECK GLUCOSE FIVE TIMES DAILY 300 each 0   pantoprazole (PROTONIX) 40 MG tablet Take 1 tablet (40 mg total) by mouth daily. 90 tablet 3   potassium chloride (KLOR-CON) 10 MEQ tablet Take 1 tablet (10 mEq total) by mouth daily. 90 tablet 3   Propylene Glycol-Glycerin (SOOTHE OP) Apply 2 drops to eye 2 (two) times daily.     rosuvastatin (CRESTOR) 5 MG tablet Take 1 tablet by mouth once daily (Patient taking differently: Take 5 mg by mouth every evening.) 90 tablet 3   triamcinolone cream (KENALOG) 0.1 % Apply topically 2 (two) times daily. 60 g 0   No current  facility-administered medications for this visit.     Past Surgical History:  Procedure Laterality Date   CARDIAC CATHETERIZATION N/A 08/24/2014  Procedure: Left Heart Cath and Coronary Angiography;  Surgeon: Leonie Man, MD;  Location: [facility];  Service: Cardiovascular;  Laterality: N/A;   COLONOSCOPY  08/15/05   few tiny diverticula at sigmoid colon/external hemorrhoids but no polyps   COLONOSCOPY  01/08/2012   [id number] diverticulosis. Next colonoscopy in 12/2016   CORONARY ARTERY BYPASS GRAFT N/A 08/24/2014   Procedure: CORONARY ARTERY BYPASS GRAFTING (CABG) x three, using left internal mammary artery and right leg greater saphenous vein harvested endoscopically;  Surgeon: Ivin Poot, MD;  Location: [facility];  Service: Open Heart Surgery;  Laterality: N/A;   ECTOPIC PREGNANCY SURGERY  1950's   ERCP with sphincterotomy  07/2002   ESOPHAGOGASTRODUODENOSCOPY      Gastritis of body.  Otherwise normal   ESOPHAGOGASTRODUODENOSCOPY  01/08/2012   RMR: Few scattered gastric erosions of uncertain significance-status post biopsy. Minimal chronic inflammation, no H.pylori   LAPAROSCOPIC CHOLECYSTECTOMY     MASTECTOMY Right 1980   PANCREATIC PSEUDOCYST DRAINAGE  [id number]   drained percutaneously    right mastectomy     tacking up of her bladder     TEE WITHOUT CARDIOVERSION N/A 08/24/2014   Procedure: TRANSESOPHAGEAL ECHOCARDIOGRAM (TEE);  Surgeon: Ivin Poot, MD;  Location: [facility];  Service: Open Heart Surgery;  Laterality: N/A;     Allergies  Allergen Reactions   Calcium-Containing Compounds Nausea Only   Morphine And Related Other (See Comments)    Hallucination   Raloxifene Itching    Evista- Face and eyes burning   Vitamin D Analogs Nausea Only      Family History  Problem Relation Age of Onset   Ovarian cancer Mother        passed   Colon cancer Father 11       passed away in his 63's   Colon cancer Brother 80       surgery inhis 17's doing well now    Heart attack Brother        passed away age 54   Pancreatitis Brother    Kidney disease Daughter    Leukemia Son      Social History Misty Edwards reports that she has never smoked. She has never used smokeless tobacco. Misty Edwards reports no history of alcohol use.   Review of Systems CONSTITUTIONAL: No weight loss, fever, chills, weakness or fatigue.  HEENT: Eyes: No visual loss, blurred vision, double vision or yellow sclerae.No hearing loss, sneezing, congestion, runny nose or sore throat.  SKIN: No rash or itching.  CARDIOVASCULAR: per hpi RESPIRATORY: No shortness of breath, cough or sputum.  GASTROINTESTINAL: No anorexia, nausea, vomiting or diarrhea. No abdominal pain or blood.  GENITOURINARY: No burning on urination, no polyuria NEUROLOGICAL: No headache, dizziness, syncope, paralysis, ataxia, numbness or tingling in the extremities. No change in bowel or bladder control.  MUSCULOSKELETAL: No muscle, back pain, joint pain or stiffness.  LYMPHATICS: No enlarged nodes. No history of splenectomy.  PSYCHIATRIC: No history of depression or anxiety.  ENDOCRINOLOGIC: No reports of sweating, cold or heat intolerance. No polyuria or polydipsia.  Marland Kitchen   Physical Examination Today's Vitals   06/09/22 1358  BP: 110/66  Pulse: 64  SpO2: 100%  Weight: 150 lb (68 kg)   Body mass index is 26.57 kg/m.  Gen: resting comfortably, no acute distress HEENT: no scleral icterus, pupils equal round and reactive, no palptable cervical adenopathy,  CV Resp: Clear to auscultation bilaterally GI: abdomen is soft, non-tender, non-distended, normal bowel sounds, no hepatosplenomegaly MSK:  extremities are warm, no edema.  Skin: warm, no rash Neuro:  no focal deficits Psych: appropriate affect     Assessment and Plan  Chronic HFpEF - fragile fluid balance, has had prior issues with fluid overload and AKI from diuretic - cotninue lasix 40mg  in am 20mg  in pm, appears euvolemic today   2.  Afib - no symptoms - has failed amio, currently remains in rate controlled afib. We will d/c amio  3. HTN - off norvasc due to leg swelling, bp's look fine. Continue to monitor  4. CAD - no symptoms, continue current meds  Arnoldo Lenis, M.D.

## 2022-06-09 NOTE — Patient Instructions (Signed)
Medication Instructions:  Your physician has recommended you make the following change in your medication:   -Stop Amiodarone    Labwork: None  Testing/Procedures: None  Follow-Up: Follow up with Dr. Harl Bowie or APP in 4 months.   Any Other Special Instructions Will Be Listed Below (If Applicable).     If you need a refill on your cardiac medications before your next appointment, please call your pharmacy.

## 2022-06-13 ENCOUNTER — Other Ambulatory Visit: Payer: Self-pay | Admitting: Family Medicine

## 2022-06-13 NOTE — Telephone Encounter (Signed)
Requested medication (s) are due for refill today: yes  Requested medication (s) are on the active medication list: yes  Last refill:  05/09/22  Future visit scheduled: yes  Notes to clinic:  Unable to refill per protocol, cannot delegate.      Requested Prescriptions  Pending Prescriptions Disp Refills   LORazepam (ATIVAN) 1 MG tablet [Pharmacy Med Name: LORazepam 1 MG Oral Tablet] 30 tablet 0    Sig: TAKE 1/2 TO 1 (ONE-HALF TO ONE) TABLET BY MOUTH TWICE DAILY AS NEEDED     Not Delegated - Psychiatry: Anxiolytics/Hypnotics 2 Failed - 06/13/2022  4:22 PM      Failed - This refill cannot be delegated      Failed - Urine Drug Screen completed in last 360 days      Failed - Valid encounter within last 6 months    Recent Outpatient Visits           11 months ago Type 2 diabetes mellitus with other specified complication, with long-term current use of insulin ([place])   [place] Pickard, Cammie Mcgee, MD   1 year ago Type 2 diabetes mellitus with other specified complication, with long-term current use of insulin ([place])   [place] Susy Frizzle, MD   2 years ago Type 2 diabetes mellitus with other specified complication, with long-term current use of insulin ([place])   [place] Pickard, Cammie Mcgee, MD   2 years ago Hospital discharge follow-up   [place], Warren T, MD   3 years ago Acute pain of both knees   [place], Cammie Mcgee, MD       Future Appointments             In 3 months [place], Fransisco Hertz, PA-[facility] [place] - Patient is not pregnant

## 2022-06-19 ENCOUNTER — Other Ambulatory Visit: Payer: Self-pay | Admitting: Family Medicine

## 2022-06-20 ENCOUNTER — Ambulatory Visit: Payer: PPO | Admitting: Family Medicine

## 2022-06-23 ENCOUNTER — Ambulatory Visit (INDEPENDENT_AMBULATORY_CARE_PROVIDER_SITE_OTHER): Payer: PPO | Admitting: Family Medicine

## 2022-06-23 ENCOUNTER — Encounter: Payer: Self-pay | Admitting: Family Medicine

## 2022-06-23 VITALS — BP 112/56 | HR 95 | Temp 98.4°F | Ht 63.0 in | Wt 141.4 lb

## 2022-06-23 DIAGNOSIS — N1832 Chronic kidney disease, stage 3b: Secondary | ICD-10-CM | POA: Diagnosis not present

## 2022-06-23 DIAGNOSIS — I509 Heart failure, unspecified: Secondary | ICD-10-CM

## 2022-06-23 NOTE — Progress Notes (Signed)
Subjective:    Patient ID: Misty Edwards, female    DOB: 1926/10/28, 89 y.o.   MRN: [id number]   Patient is here today with her grandson.  He denies any further hypoglycemic episodes.  Cardiology discontinued amiodarone and we discontinued amlodipine.  Blood pressure remains well-controlled.  Blood pressures remain between 110 and 150 systolic.  Patient is maintaining her fluid status on 40 mg of Lasix in the morning and 20 mg of Lasix in the afternoon.  Lucila Maine is weighing her on a daily basis and giving her an extra Lasix only if the weight goes up 3 or more pounds.  The patient denies any chest pain or shortness of breath orthopnea.  Overall she states that she feels well Past Medical History:  Diagnosis Date   Acute biliary pancreatitis 07/2002   thia was in 07/2004:she still has pseudocyst in tail of pancreas measuring 54 x 33 mm    Adrenal adenoma    bilateral   Anxiety    CAD (coronary artery disease)    a. s/p CABG in 08/2014 with LIMA-LAD, SVG-OM and SVG-PDA   Chronic pancreatitis    Detached retina    Diabetes mellitus    Diverticulosis    Heartburn    History of carcinoma in situ of breast 1988   Hyperlipidemia    Hypertension    IBS (irritable bowel syndrome)    Legally blind    Osteoarthritis    Osteopenia    Upper GI bleed September2004   secondary to gastritis   Past Surgical History:  Procedure Laterality Date   CARDIAC CATHETERIZATION N/A 08/24/2014   Procedure: Left Heart Cath and Coronary Angiography;  Surgeon: Marykay Lex, MD;  Location: [facility] INVASIVE CV LAB;  Service: Cardiovascular;  Laterality: N/A;   COLONOSCOPY  08/15/05   few tiny diverticula at sigmoid colon/external hemorrhoids but no polyps   COLONOSCOPY  01/08/2012   [id number] diverticulosis. Next colonoscopy in 12/2016   CORONARY ARTERY BYPASS GRAFT N/A 08/24/2014   Procedure: CORONARY ARTERY BYPASS GRAFTING (CABG) x three, using left internal mammary artery and right leg greater saphenous vein  harvested endoscopically;  Surgeon: Kerin Perna, MD;  Location: [facility] OR;  Service: Open Heart Surgery;  Laterality: N/A;   ECTOPIC PREGNANCY SURGERY  1950's   ERCP with sphincterotomy  07/2002   ESOPHAGOGASTRODUODENOSCOPY      Gastritis of body.  Otherwise normal   ESOPHAGOGASTRODUODENOSCOPY  01/08/2012   RMR: Few scattered gastric erosions of uncertain significance-status post biopsy. Minimal chronic inflammation, no H.pylori   LAPAROSCOPIC CHOLECYSTECTOMY     MASTECTOMY Right 1980   PANCREATIC PSEUDOCYST DRAINAGE  [id number]   drained percutaneously    right mastectomy     tacking up of her bladder     TEE WITHOUT CARDIOVERSION N/A 08/24/2014   Procedure: TRANSESOPHAGEAL ECHOCARDIOGRAM (TEE);  Surgeon: Kerin Perna, MD;  Location: [facility] OR;  Service: Open Heart Surgery;  Laterality: N/A;   Current Outpatient Medications on File Prior to Visit  Medication Sig Dispense Refill   apixaban (ELIQUIS) 5 MG TABS tablet Take 1 tablet (5 mg total) by mouth 2 (two) times daily. 60 tablet 3   BD INSULIN SYRINGE U/F 31G X 5/16" 0.5 ML MISC USE AS DIRECTED TWICE DAILY 200 each 0   Blood Glucose Monitoring Suppl (BLOOD GLUCOSE SYSTEM PAK) KIT Please dispense as One Touch Ultra. Use as directed to monitor FSBS 4x daily. Dx: E11.65 1 kit 1   calcium carbonate (TUMS - DOSED IN MG  ELEMENTAL CALCIUM) 500 MG chewable tablet Chew 3 tablets by mouth as needed for heartburn or indigestion.     cetirizine (ZYRTEC) 10 MG tablet Take 1 tablet by mouth once daily 90 tablet 0   Continuous Blood Gluc Receiver (FREESTYLE LIBRE 3 READER) DEVI 1 Device by Does not apply route daily. Use to continuously check blood sugar daily. 1 each 1   Continuous Blood Gluc Sensor (FREESTYLE LIBRE 3 SENSOR) MISC Place 1 sensor on the skin every 14 days. Use to check glucose continuously 2 each 5   CREON [id number] units CPEP Take 1 capsule (24,000 Units total) by mouth See admin instructions. TAKE 2 CAPSULES BY MOUTH WITH MEALS AND 1 CAP  WITH SNACKS x 2     famotidine (PEPCID) 40 MG tablet Take 1 tablet (40 mg total) by mouth daily. (Patient taking differently: Take 40 mg by mouth daily at 12 noon.) 30 tablet 3   ferrous sulfate 325 (65 FE) MG tablet Take 325 mg by mouth daily with breakfast.     FLUoxetine (PROZAC) 20 MG capsule Take 1 capsule by mouth once daily 90 capsule 0   furosemide (LASIX) 20 MG tablet Take 1 tablet (20 mg total) by mouth See admin instructions. Take 40 mg in the morning and 20 mg at 4 pm.     gabapentin (NEURONTIN) 100 MG capsule TAKE 1 CAPSULE BY MOUTH THREE TIMES DAILY AS NEEDED FOR  NERVE  PAIN 90 capsule 0   insulin NPH-regular Human (NOVOLIN 70/30 RELION) (70-30) 100 UNIT/ML injection Inject 20-24 Units into the skin See admin instructions. INJECT 24 UNITS SUBCUTANEOUSLY IN THE MORNING AND 20 IN THE EVENING ( Sliding Scale) Checks 2 hours after pt eats.     Lancets Misc. MISC Pt has Accu Chek Aviva Plus - Needs just lancets  Checks BS 4-5 times per day. Disp 3 boxes(90 day Supply)/4 refills Dx code. E11.9 450 each 3   LORazepam (ATIVAN) 1 MG tablet TAKE 1/2 TO 1 (ONE-HALF TO ONE) TABLET BY MOUTH TWICE DAILY AS NEEDED 30 tablet 0   MAGNESIUM-OXIDE 400 (240 Mg) MG tablet TAKE 1 TABLET BY MOUTH ONCE DAILY . APPOINTMENT REQUIRED FOR FUTURE REFILLS (Patient taking differently: Take 400 mg by mouth daily. Lunch time) 30 tablet 6   metoprolol tartrate (LOPRESSOR) 50 MG tablet Take 1 tablet (50 mg total) by mouth 2 (two) times daily. 60 tablet 3   Multiple Vitamin (MULTIVITAMIN) capsule Take 1 capsule by mouth every morning.      OneTouch Delica Lancets 33G MISC CHECK BLOOD SUGAR 4-5 TIMES PER DAY 300 each 0   ONETOUCH ULTRA test strip USE 1 STRIP TO CHECK GLUCOSE FIVE TIMES DAILY 300 each 0   pantoprazole (PROTONIX) 40 MG tablet Take 1 tablet (40 mg total) by mouth daily. 90 tablet 3   potassium chloride (KLOR-CON) 10 MEQ tablet Take 1 tablet (10 mEq total) by mouth daily. 90 tablet 3   Propylene  Glycol-Glycerin (SOOTHE OP) Apply 2 drops to eye 2 (two) times daily.     rosuvastatin (CRESTOR) 5 MG tablet Take 1 tablet by mouth once daily (Patient taking differently: Take 5 mg by mouth every evening.) 90 tablet 3   triamcinolone cream (KENALOG) 0.1 % Apply topically 2 (two) times daily. 60 g 0   No current facility-administered medications on file prior to visit.   Allergies  Allergen Reactions   Calcium-Containing Compounds Nausea Only   Morphine And Related Other (See Comments)    Hallucination   Raloxifene  Itching    Evista- Face and eyes burning   Vitamin D Analogs Nausea Only   Social History   Socioeconomic History   Marital status: Widowed    Spouse name: Not on file   Number of children: 5   Years of education: Not on file   Highest education level: Not on file  Occupational History   Occupation: homemaker  Tobacco Use   Smoking status: Never   Smokeless tobacco: Never  Vaping Use   Vaping Use: Never used  Substance and Sexual Activity   Alcohol use: No    Alcohol/week: 0.0 standard drinks of alcohol   Drug use: No   Sexual activity: Not Currently    Birth control/protection: Post-menopausal  Other Topics Concern   Not on file  Social History Narrative   Lives in [place].   Social Determinants of Health   Financial Resource Strain: Not on file  Food Insecurity: No Food Insecurity (04/15/2022)   Hunger Vital Sign    Worried About Running Out of Food in the Last Year: Never true    Ran Out of Food in the Last Year: Never true  Transportation Needs: No Transportation Needs (04/15/2022)   PRAPARE - Administrator, Civil Service (Medical): No    Lack of Transportation (Non-Medical): No  Physical Activity: Not on file  Stress: Not on file  Social Connections: Not on file  Intimate Partner Violence: Not At Risk (04/15/2022)   Humiliation, Afraid, Rape, and Kick questionnaire    Fear of Current or Ex-Partner: No    Emotionally Abused: No     Physically Abused: No    Sexually Abused: No          Review of Systems  Musculoskeletal:  Positive for back pain.  Skin:  Positive for rash.  All other systems reviewed and are negative.      Objective:   Physical Exam Constitutional:      General: She is not in acute distress.    Appearance: Normal appearance. She is normal weight. She is not ill-appearing or toxic-appearing.  Cardiovascular:     Rate and Rhythm: Normal rate. Rhythm irregular.     Heart sounds: Normal heart sounds. No murmur heard.    No friction rub. No gallop.  Pulmonary:     Effort: Pulmonary effort is normal. No respiratory distress.     Breath sounds: No stridor. No wheezing, rhonchi or rales.  Chest:     Chest wall: No tenderness.  Abdominal:     General: Abdomen is flat. Bowel sounds are normal. There is no distension.     Palpations: Abdomen is soft. There is no mass.     Tenderness: There is no abdominal tenderness.  Musculoskeletal:     Right lower leg: No edema.     Left lower leg: No edema.  Skin:    Findings: No erythema or rash.  Neurological:     General: No focal deficit present.     Mental Status: She is alert and oriented to person, place, and time. Mental status is at baseline.     Cranial Nerves: No cranial nerve deficit.     Sensory: No sensory deficit.     Motor: Weakness present.     Coordination: Coordination normal.        Assessment & Plan:  Congestive heart failure, unspecified HF chronicity, unspecified heart failure type - Plan: BASIC METABOLIC PANEL WITH GFR, CBC with Differential/Platelet  Stage 3b chronic kidney disease - Plan:  BASIC METABOLIC PANEL WITH GFR, CBC with Differential/Platelet Patient appears euvolemic today.  I will check her renal function.  Patient seems to be doing very well on 40 mg of Lasix in the morning and 20 mg of Lasix in the afternoon.  Creatinine and potassium are stable this will become her new baseline dose.  Blood sugars seem  well-controlled with no evidence of hypoglycemia.  No changes in her current medication HPI

## 2022-06-24 ENCOUNTER — Telehealth: Payer: Self-pay

## 2022-06-24 ENCOUNTER — Other Ambulatory Visit: Payer: Self-pay

## 2022-06-24 DIAGNOSIS — I4891 Unspecified atrial fibrillation: Secondary | ICD-10-CM

## 2022-06-24 DIAGNOSIS — I509 Heart failure, unspecified: Secondary | ICD-10-CM

## 2022-06-24 LAB — CBC WITH DIFFERENTIAL/PLATELET
Absolute Monocytes: 399 cells/uL (ref 200–950)
Basophils Absolute: 29 cells/uL (ref 0–200)
Basophils Relative: 0.5 %
Eosinophils Absolute: 51 cells/uL (ref 15–500)
Eosinophils Relative: 0.9 %
HCT: 35.2 % (ref 35.0–45.0)
Hemoglobin: 11 g/dL — ABNORMAL LOW (ref 11.7–15.5)
Lymphs Abs: 1345 cells/uL (ref 850–3900)
MCH: 27 pg (ref 27.0–33.0)
MCHC: 31.3 g/dL — ABNORMAL LOW (ref 32.0–36.0)
MCV: 86.3 fL (ref 80.0–100.0)
MPV: 11.7 fL (ref 7.5–12.5)
Monocytes Relative: 7 %
Neutro Abs: 3876 cells/uL (ref 1500–7800)
Neutrophils Relative %: 68 %
Platelets: 159 10*3/uL (ref 140–400)
RBC: 4.08 10*6/uL (ref 3.80–5.10)
RDW: 15 % (ref 11.0–15.0)
Total Lymphocyte: 23.6 %
WBC: 5.7 10*3/uL (ref 3.8–10.8)

## 2022-06-24 LAB — BASIC METABOLIC PANEL WITH GFR
BUN/Creatinine Ratio: 27 (calc) — ABNORMAL HIGH (ref 6–22)
BUN: 38 mg/dL — ABNORMAL HIGH (ref 7–25)
CO2: 32 mmol/L (ref 20–32)
Calcium: 9 mg/dL (ref 8.6–10.4)
Chloride: 96 mmol/L — ABNORMAL LOW (ref 98–110)
Creat: 1.39 mg/dL — ABNORMAL HIGH (ref 0.60–0.95)
Glucose, Bld: 272 mg/dL — ABNORMAL HIGH (ref 65–99)
Potassium: 4.4 mmol/L (ref 3.5–5.3)
Sodium: 137 mmol/L (ref 135–146)
eGFR: 35 mL/min/{1.73_m2} — ABNORMAL LOW (ref >=60)

## 2022-06-24 MED ORDER — APIXABAN 5 MG PO TABS: 5.0000 mg | ORAL_TABLET | Freq: Two times a day (BID) | ORAL | 1 refills | Status: DC

## 2022-06-24 NOTE — Telephone Encounter (Signed)
Prescription Request  06/24/2022  LOV: 06/23/22  What is the name of the medication or equipment? apixaban (ELIQUIS) 5 MG TABS tablet   Have you contacted your pharmacy to request a refill? Yes   Which pharmacy would you like this sent to?  [facility] - [place], [place] - [address], [place] [address] Phone: [phone]  Fax: [phone]   Patient notified that their request is being sent to the clinical staff for review and that they should receive a response within 2 business days.   Please advise at [facility] [phone]  Prescription Request  06/24/2022  LOV: 06/23/22  What is the name of the medication or equipment? metoprolol tartrate (LOPRESSOR) 50 MG tablet [[id number]]  Have you contacted your pharmacy to request a refill? Yes   Which pharmacy would you like this sent to?  [facility] [address] [place] - [address], [place] [address] Phone: [phone]  Fax: [phone]   Patient notified that their request is being sent to the clinical staff for review and that they should receive a response within 2 business days.   Please advise at [facility] [phone]  Prescription Request  06/24/2022  LOV: 06/23/22  What is the name of the medication or equipment? amiodarone (PACERONE) 200 MG tablet [[id number]]  DISCONTINUED   Have you contacted your pharmacy to request a refill? Yes   Which pharmacy would you like this sent to? [facility] [address], [place] - [address], [place] [address] Phone: [phone]  Fax: [phone]    Patient notified that their request is being sent to the clinical staff for review and that they should receive a response within 2 business days.   Please advise at [facility] [phone]

## 2022-06-25 ENCOUNTER — Other Ambulatory Visit: Payer: Self-pay | Admitting: Family Medicine

## 2022-06-26 NOTE — Telephone Encounter (Signed)
Requested Prescriptions  Pending Prescriptions Disp Refills   gabapentin (NEURONTIN) 100 MG capsule [Pharmacy Med Name: Gabapentin 100 MG Oral Capsule] 90 capsule 2    Sig: TAKE 1 CAPSULE BY MOUTH THREE TIMES DAILY AS NEEDED FOR NERVE PAIN     Neurology: Anticonvulsants - gabapentin Failed - 06/25/2022  9:23 AM      Failed - Cr in normal range and within 360 days    Creat  Date Value Ref Range Status  06/23/2022 1.39 (H) 0.60 - 0.95 mg/dL Final         Failed - Valid encounter within last 12 months    Recent Outpatient Visits           1 year ago Type 2 diabetes mellitus with other specified complication, with long-term current use of insulin (HCC)   [facility] Pickard, Priscille Heidelberg, MD   1 year ago Type 2 diabetes mellitus with other specified complication, with long-term current use of insulin (HCC)   [facility] Donita Brooks, MD   2 years ago Type 2 diabetes mellitus with other specified complication, with long-term current use of insulin (HCC)   [facility] Pickard, Priscille Heidelberg, MD   2 years ago Hospital discharge follow-up   [facility] Donita Brooks, MD   3 years ago Acute pain of both knees   [facility] Pickard, Priscille Heidelberg, MD       Future Appointments             In 3 months Strader, Lennart Pall, PA-C [facility], [place]            Passed - Completed PHQ-2 or PHQ-9 in the last 360 days

## 2022-06-30 ENCOUNTER — Other Ambulatory Visit: Payer: Self-pay

## 2022-06-30 MED ORDER — METOPROLOL TARTRATE 50 MG PO TABS: 50.0000 mg | ORAL_TABLET | Freq: Two times a day (BID) | ORAL | 3 refills | Status: DC

## 2022-07-08 ENCOUNTER — Telehealth: Payer: Self-pay | Admitting: Cardiology

## 2022-07-08 DIAGNOSIS — I4891 Unspecified atrial fibrillation: Secondary | ICD-10-CM

## 2022-07-08 DIAGNOSIS — I509 Heart failure, unspecified: Secondary | ICD-10-CM

## 2022-07-08 MED ORDER — APIXABAN 5 MG PO TABS: 5.0000 mg | ORAL_TABLET | Freq: Two times a day (BID) | ORAL | 1 refills | Status: DC

## 2022-07-08 NOTE — Telephone Encounter (Signed)
Prescription refill request for Eliquis received. Indication: AF Last office visit: 06/09/22  Dominga Ferry MD Scr: 1.39 on 06/23/22  Epic Age: 89 Weight: 68kg  Based on above findings Eliquis  twice daily is the appropriate dose.  Refill approved.

## 2022-07-08 NOTE — Telephone Encounter (Signed)
*  STAT* If patient is at the pharmacy, call can be transferred to refill team.   1. Which medications need to be refilled? (please list name of each medication and dose if known) apixaban (ELIQUIS) 5 MG TABS tablet   2. Which pharmacy/location (including street and city if local pharmacy) is medication to be sent to?  [facility] - [place], [place] - [address]    3. Do they need a 30 day or 90 day supply? 90  Patient will be out of medication tomorrow

## 2022-07-13 ENCOUNTER — Other Ambulatory Visit: Payer: Self-pay | Admitting: Family Medicine

## 2022-07-14 ENCOUNTER — Other Ambulatory Visit: Payer: Self-pay | Admitting: Family Medicine

## 2022-07-14 ENCOUNTER — Telehealth: Payer: Self-pay

## 2022-07-14 MED ORDER — LORAZEPAM 1 MG PO TABS: ORAL_TABLET | ORAL | 0 refills | Status: DC

## 2022-07-14 NOTE — Telephone Encounter (Signed)
Pt is asking for a new refill. Prescription Request  07/14/2022  LOV:  06/23/22  What is the name of the medication or equipment? LORazepam (ATIVAN) 1 MG tablet [[id number]]  Have you contacted your pharmacy to request a refill? Yes   Which pharmacy would you like this sent to?  [facility] [address] [place] - [address], [place] [address] Phone: [phone]  Fax: [phone]    Patient notified that their request is being sent to the clinical staff for review and that they should receive a response within 2 business days.   Please advise at [facility] [phone]

## 2022-07-24 ENCOUNTER — Other Ambulatory Visit: Payer: Self-pay | Admitting: Family Medicine

## 2022-07-24 ENCOUNTER — Other Ambulatory Visit: Payer: Self-pay | Admitting: Cardiology

## 2022-07-24 NOTE — Telephone Encounter (Signed)
Requested medication (s) are due for refill today: Yes  Requested medication (s) are on the active medication list: yes  Last refill:  05/26/21  Future visit scheduled: yes  Notes to clinic:  Unable to refill per protocol, Rx request is not attached to protocol. Routing for review.      Requested Prescriptions  Pending Prescriptions Disp Refills   ONETOUCH ULTRA TEST test strip [Pharmacy Med Name: OneTouch Ultra Blue In Vitro Strip] 300 each 0    Sig: USE 1 STRIP TO CHECK GLUCOSE FIVE TIMES DAILY     There is no refill protocol information for this order

## 2022-08-03 ENCOUNTER — Other Ambulatory Visit: Payer: Self-pay | Admitting: Family Medicine

## 2022-08-09 ENCOUNTER — Ambulatory Visit: Admission: EM | Admit: 2022-08-09 | Discharge: 2022-08-09 | Discharge status: Absent without leave | Payer: PPO

## 2022-08-09 ENCOUNTER — Ambulatory Visit
Admission: EM | Admit: 2022-08-09 | Discharge: 2022-08-09 | Disposition: A | Discharge status: Home / Self Care | Payer: PPO | Attending: Urgent Care | Admitting: Urgent Care

## 2022-08-09 ENCOUNTER — Ambulatory Visit (INDEPENDENT_AMBULATORY_CARE_PROVIDER_SITE_OTHER): Payer: PPO

## 2022-08-09 ENCOUNTER — Ambulatory Visit: Payer: PPO

## 2022-08-09 DIAGNOSIS — M5136 Other intervertebral disc degeneration, lumbar region: Secondary | ICD-10-CM

## 2022-08-09 DIAGNOSIS — S300XXA Contusion of lower back and pelvis, initial encounter: Secondary | ICD-10-CM

## 2022-08-09 DIAGNOSIS — S22000A Wedge compression fracture of unspecified thoracic vertebra, initial encounter for closed fracture: Secondary | ICD-10-CM | POA: Diagnosis not present

## 2022-08-09 DIAGNOSIS — I4891 Unspecified atrial fibrillation: Secondary | ICD-10-CM

## 2022-08-09 MED ORDER — PREDNISONE 20 MG PO TABS: 20.0000 mg | ORAL_TABLET | Freq: Every day | ORAL | 0 refills | Status: DC

## 2022-08-09 NOTE — ED Provider Notes (Signed)
[facility]RG[facility]  Note:  This document was prepared using Conservation officer, historic buildings and may include unintentional dictation errors.  MRN: [id number] DOB: [birth year]  Subjective:   Misty Edwards is a 89 y.o. female presenting for 9-day history of persistent pain of the low back without radiation.  Symptoms started after she accidentally slipped and fell at home.  She did fall backward onto her low back.  Denies changes to bowel or urinary habits, frank hematuria, weakness, numbness or tingling beyond what is normal for her.  Has used Tylenol with minimal relief.  She does have to ambulate in a wheelchair for long distances but otherwise has been able to stand and walk in her home as normal albeit with intermittent pains of the low back.  She has a history of atrial fibrillation, congestive heart failure.  Last echocardiogram from 2023 showed an ejection fraction of 60 to 65%.  Has a history of CKD, last creatinine level was 1.39 a month ago.  She also had an EGFR of 35 mL/min.  No current facility-administered medications for this encounter.  Current Outpatient Medications:    apixaban (ELIQUIS) 5 MG TABS tablet, Take 1 tablet (5 mg total) by mouth 2 (two) times daily., Disp: 180 tablet, Rfl: 1   BD INSULIN SYRINGE U/F 31G X 5/16" 0.5 ML MISC, USE AS DIRECTED TWICE DAILY, Disp: 200 each, Rfl: 0   Blood Glucose Monitoring Suppl (BLOOD GLUCOSE SYSTEM PAK) KIT, Please dispense as One Touch Ultra. Use as directed to monitor FSBS 4x daily. Dx: E11.65, Disp: 1 kit, Rfl: 1   calcium carbonate (TUMS - DOSED IN MG ELEMENTAL CALCIUM) 500 MG chewable tablet, Chew 3 tablets by mouth as needed for heartburn or indigestion., Disp: , Rfl:    cetirizine (ZYRTEC) 10 MG tablet, Take 1 tablet by mouth once daily, Disp: 90 tablet, Rfl: 0   Continuous Blood Gluc Receiver (FREESTYLE LIBRE 3 READER) DEVI, 1 Device by Does not apply route daily. Use to continuously check blood sugar daily.,  Disp: 1 each, Rfl: 1   Continuous Blood Gluc Sensor (FREESTYLE LIBRE 3 SENSOR) MISC, Place 1 sensor on the skin every 14 days. Use to check glucose continuously, Disp: 2 each, Rfl: 5   CREON 24000-76000 units CPEP, Take 1 capsule (24,000 Units total) by mouth See admin instructions. TAKE 2 CAPSULES BY MOUTH WITH MEALS AND 1 CAP WITH SNACKS x 2, Disp: , Rfl:    famotidine (PEPCID) 40 MG tablet, Take 1 tablet by mouth once daily, Disp: 90 tablet, Rfl: 0   ferrous sulfate 325 (65 FE) MG tablet, Take 325 mg by mouth daily with breakfast., Disp: , Rfl:    FLUoxetine (PROZAC) 20 MG capsule, Take 1 capsule by mouth once daily, Disp: 90 capsule, Rfl: 0   furosemide (LASIX) 20 MG tablet, Take 1 tablet (20 mg total) by mouth See admin instructions. Take 40 mg in the morning and 20 mg at 4 pm., Disp: , Rfl:    gabapentin (NEURONTIN) 100 MG capsule, TAKE 1 CAPSULE BY MOUTH THREE TIMES DAILY AS NEEDED FOR NERVE PAIN, Disp: 90 capsule, Rfl: 2   insulin NPH-regular Human (NOVOLIN 70/30 RELION) (70-30) 100 UNIT/ML injection, Inject 20-24 Units into the skin See admin instructions. INJECT 24 UNITS SUBCUTANEOUSLY IN THE MORNING AND 20 IN THE EVENING ( Sliding Scale) Checks 2 hours after pt eats., Disp: , Rfl:    Lancets Misc. MISC, Pt has Accu Chek Aviva Plus - Needs just lancets  Checks BS 4-5 times per day. Disp 3 boxes(90 day Supply)/4 refills Dx code. E11.9, Disp: 450 each, Rfl: 3   LORazepam (ATIVAN) 1 MG tablet, TAKE 1/2 TO 1 (ONE-HALF TO ONE) TABLET BY MOUTH TWICE DAILY AS NEEDED, Disp: 30 tablet, Rfl: 0   MAGnesium-Oxide 400 (240 Mg) MG tablet, TAKE 1 TABLET BY MOUTH ONCE DAILY . APPOINTMENT REQUIRED FOR FUTURE REFILLS, Disp: 30 tablet, Rfl: 3   metoprolol tartrate (LOPRESSOR) 50 MG tablet, Take 1 tablet (50 mg total) by mouth 2 (two) times daily., Disp: 60 tablet, Rfl: 3   Multiple Vitamin (MULTIVITAMIN) capsule, Take 1 capsule by mouth every morning. , Disp: , Rfl:    OneTouch Delica Lancets 33G MISC, CHECK  BLOOD SUGAR 4-5 TIMES PER DAY, Disp: 300 each, Rfl: 0   ONETOUCH ULTRA TEST test strip, USE 1 STRIP TO CHECK GLUCOSE FIVE TIMES DAILY, Disp: 300 each, Rfl: 0   pantoprazole (PROTONIX) 40 MG tablet, Take 1 tablet (40 mg total) by mouth daily., Disp: 90 tablet, Rfl: 3   potassium chloride (KLOR-CON) 10 MEQ tablet, Take 1 tablet (10 mEq total) by mouth daily., Disp: 90 tablet, Rfl: 3   Propylene Glycol-Glycerin (SOOTHE OP), Apply 2 drops to eye 2 (two) times daily., Disp: , Rfl:    rosuvastatin (CRESTOR) 5 MG tablet, Take 1 tablet by mouth once daily (Patient taking differently: Take 5 mg by mouth every evening.), Disp: 90 tablet, Rfl: 3   triamcinolone cream (KENALOG) 0.1 %, Apply topically 2 (two) times daily., Disp: 60 g, Rfl: 0   Allergies  Allergen Reactions   Calcium-Containing Compounds Nausea Only   Morphine And Codeine Other (See Comments)    Hallucination   Raloxifene Itching    Evista- Face and eyes burning   Vitamin D Analogs Nausea Only    Past Medical History:  Diagnosis Date   Acute biliary pancreatitis 07/2002   thia was in 07/2004:she still has pseudocyst in tail of pancreas measuring 54 x 33 mm    Adrenal adenoma    bilateral   Anxiety    CAD (coronary artery disease)    a. s/p CABG in 08/2014 with LIMA-LAD, SVG-OM and SVG-PDA   Chronic pancreatitis (HCC)    Detached retina    Diabetes mellitus (HCC)    Diverticulosis    Heartburn    History of carcinoma in situ of breast 1988   Hyperlipidemia    Hypertension    IBS (irritable bowel syndrome)    Legally blind    Osteoarthritis    Osteopenia    Upper GI bleed September2004   secondary to gastritis     Past Surgical History:  Procedure Laterality Date   CARDIAC CATHETERIZATION N/A 08/24/2014   Procedure: Left Heart Cath and Coronary Angiography;  Surgeon: Marykay Lex, MD;  Location: [facility] INVASIVE CV LAB;  Service: Cardiovascular;  Laterality: N/A;   COLONOSCOPY  08/15/05   few tiny diverticula at sigmoid  colon/external hemorrhoids but no polyps   COLONOSCOPY  01/08/2012   [id number] diverticulosis. Next colonoscopy in 12/2016   CORONARY ARTERY BYPASS GRAFT N/A 08/24/2014   Procedure: CORONARY ARTERY BYPASS GRAFTING (CABG) x three, using left internal mammary artery and right leg greater saphenous vein harvested endoscopically;  Surgeon: Kerin Perna, MD;  Location: [facility] OR;  Service: Open Heart Surgery;  Laterality: N/A;   ECTOPIC PREGNANCY SURGERY  1950's   ERCP with sphincterotomy  07/2002   ESOPHAGOGASTRODUODENOSCOPY      Gastritis of body.  Otherwise normal  ESOPHAGOGASTRODUODENOSCOPY  01/08/2012   RMR: Few scattered gastric erosions of uncertain significance-status post biopsy. Minimal chronic inflammation, no H.pylori   LAPAROSCOPIC CHOLECYSTECTOMY     MASTECTOMY Right 1980   PANCREATIC PSEUDOCYST DRAINAGE  [id number]   drained percutaneously    right mastectomy     tacking up of her bladder     TEE WITHOUT CARDIOVERSION N/A 08/24/2014   Procedure: TRANSESOPHAGEAL ECHOCARDIOGRAM (TEE);  Surgeon: Kerin Perna, MD;  Location: [facility] OR;  Service: Open Heart Surgery;  Laterality: N/A;    Family History  Problem Relation Age of Onset   Ovarian cancer Mother        passed   Colon cancer Father 46       passed away in his 61's   Colon cancer Brother 65       surgery inhis 50's doing well now   Heart attack Brother        passed away age 9   Pancreatitis Brother    Kidney disease Daughter    Leukemia Son     Social History   Tobacco Use   Smoking status: Never   Smokeless tobacco: Never  Vaping Use   Vaping Use: Never used  Substance Use Topics   Alcohol use: No    Alcohol/week: 0.0 standard drinks of alcohol   Drug use: No    ROS   Objective:   Vitals: BP 137/66 (BP Location: Left Arm)   Pulse 77   Temp (!) 97.5 F (36.4 C) (Oral)   Resp 16   Ht 5\' 3"  (1.6 m)   Wt 143 lb (64.9 kg)   SpO2 94%   BMI 25.33 kg/m   Physical Exam Constitutional:      General:  She is not in acute distress.    Appearance: Normal appearance. She is well-developed. She is not ill-appearing, toxic-appearing or diaphoretic.  HENT:     Head: Normocephalic and atraumatic.     Nose: Nose normal.     Mouth/Throat:     Mouth: Mucous membranes are moist.  Eyes:     General: No scleral icterus.       Right eye: No discharge.        Left eye: No discharge.     Extraocular Movements: Extraocular movements intact.  Cardiovascular:     Rate and Rhythm: Normal rate.  Pulmonary:     Effort: Pulmonary effort is normal.  Musculoskeletal:     Lumbar back: Tenderness (over area outlined) present. No swelling, edema, deformity, signs of trauma, lacerations, spasms or bony tenderness. Decreased range of motion. Negative right straight leg raise test and negative left straight leg raise test. No scoliosis.       Back:  Skin:    General: Skin is warm and dry.  Neurological:     General: No focal deficit present.     Mental Status: She is alert and oriented to person, place, and time.  Psychiatric:        Mood and Affect: Mood normal.        Behavior: Behavior normal.    DG Lumbar Spine 2-3 Views  Result Date: 08/09/2022 CLINICAL DATA:  Low back pain after fall 9 days ago. EXAM: LUMBAR SPINE - 2-3 VIEW COMPARISON:  Abdominal radiograph dated 09/14/2018. FINDINGS: Lumbar spine alignment is normal. There is a compression deformity of T12 with approximately 25-50% height loss anteriorly which has a chronic appearance. No retropulsion into the central canal. No definite acute fracture. Moderate degenerative changes are  seen throughout the lumbar spine. Vascular calcifications are seen in the abdominal aorta. IMPRESSION: 1. Chronic appearing compression deformity of T12. No definite acute fracture. 2. Moderate degenerative changes throughout the lumbar spine. Electronically Signed   By: Romona Curls M.D.   On: 08/09/2022 13:58     Assessment and Plan :   PDMP not reviewed this  encounter.  1. Contusion of lower back, initial encounter   2. Degenerative disc disease, lumbar   3. Compression fracture of thoracic vertebra, unspecified thoracic vertebral level, initial encounter (HCC)   4. Atrial fibrillation, unspecified type (HCC)    Recommended low-dose prednisone to help with her persistent back pain in the context of her degenerative changes.  The lower dose is appropriate given her risk factors of congestive heart failure, atrial fibrillation and also taken to the count that Tylenol has not helped and she has kidney disease.  Otherwise manage conservatively for low back contusion. Counseled patient on potential for adverse effects with medications prescribed/recommended today, ER and return-to-clinic precautions discussed, patient verbalized understanding.    Wallis Bamberg, PA-C 08/09/22 1420

## 2022-08-09 NOTE — ED Triage Notes (Signed)
Patient was in her home and having her dining room painted. She walked around to look at it and stepped on something and fell backwards onto her back 9 days ago. No numbness or tingling. Does not radiate down legs. She describes the pain at sharp and stabbing. Worsens with twisting and bending. She has taking Tylenol and Aspercreme which helps some. She is on Eliquis.

## 2022-08-15 ENCOUNTER — Ambulatory Visit (INDEPENDENT_AMBULATORY_CARE_PROVIDER_SITE_OTHER): Payer: PPO | Admitting: Family Medicine

## 2022-08-15 VITALS — BP 116/60 | HR 65 | Temp 97.5°F | Ht 63.0 in | Wt 141.0 lb

## 2022-08-15 DIAGNOSIS — E1169 Type 2 diabetes mellitus with other specified complication: Secondary | ICD-10-CM

## 2022-08-15 DIAGNOSIS — Z794 Long term (current) use of insulin: Secondary | ICD-10-CM

## 2022-08-15 NOTE — Progress Notes (Signed)
Subjective:    Patient ID: Misty Edwards, female    DOB: 07-17-1926, 89 y.o.   MRN: [id number]   Patient is currently on 70/30 insulin 30 units morning and 20 units in the evening.  She has continuous blood glucose monitoring.  Only 30% of the time if she is in range.  30% of the time she is between 180 and 250 at 30% of the time she is greater than 250.  The vast majority of her elevated blood sugars are after supper time.  From 6 PM to 12 PM she averages well above 250.  She has 0 episodes of hypoglycemia Past Medical History:  Diagnosis Date   Acute biliary pancreatitis 07/2002   thia was in 07/2004:she still has pseudocyst in tail of pancreas measuring 54 x 33 mm    Adrenal adenoma    bilateral   Anxiety    CAD (coronary artery disease)    a. s/p CABG in 08/2014 with LIMA-LAD, SVG-OM and SVG-PDA   Chronic pancreatitis (HCC)    Detached retina    Diabetes mellitus (HCC)    Diverticulosis    Heartburn    History of carcinoma in situ of breast 1988   Hyperlipidemia    Hypertension    IBS (irritable bowel syndrome)    Legally blind    Osteoarthritis    Osteopenia    Upper GI bleed September2004   secondary to gastritis   Past Surgical History:  Procedure Laterality Date   CARDIAC CATHETERIZATION N/A 08/24/2014   Procedure: Left Heart Cath and Coronary Angiography;  Surgeon: Marykay Lex, MD;  Location: [facility] INVASIVE CV LAB;  Service: Cardiovascular;  Laterality: N/A;   COLONOSCOPY  08/15/05   few tiny diverticula at sigmoid colon/external hemorrhoids but no polyps   COLONOSCOPY  01/08/2012   [id number] diverticulosis. Next colonoscopy in 12/2016   CORONARY ARTERY BYPASS GRAFT N/A 08/24/2014   Procedure: CORONARY ARTERY BYPASS GRAFTING (CABG) x three, using left internal mammary artery and right leg greater saphenous vein harvested endoscopically;  Surgeon: Kerin Perna, MD;  Location: [facility] OR;  Service: Open Heart Surgery;  Laterality: N/A;   ECTOPIC PREGNANCY SURGERY  1950's    ERCP with sphincterotomy  07/2002   ESOPHAGOGASTRODUODENOSCOPY      Gastritis of body.  Otherwise normal   ESOPHAGOGASTRODUODENOSCOPY  01/08/2012   RMR: Few scattered gastric erosions of uncertain significance-status post biopsy. Minimal chronic inflammation, no H.pylori   LAPAROSCOPIC CHOLECYSTECTOMY     MASTECTOMY Right 1980   PANCREATIC PSEUDOCYST DRAINAGE  [id number]   drained percutaneously    right mastectomy     tacking up of her bladder     TEE WITHOUT CARDIOVERSION N/A 08/24/2014   Procedure: TRANSESOPHAGEAL ECHOCARDIOGRAM (TEE);  Surgeon: Kerin Perna, MD;  Location: [facility] OR;  Service: Open Heart Surgery;  Laterality: N/A;   Current Outpatient Medications on File Prior to Visit  Medication Sig Dispense Refill   apixaban (ELIQUIS) 5 MG TABS tablet Take 1 tablet (5 mg total) by mouth 2 (two) times daily. 180 tablet 1   BD INSULIN SYRINGE U/F 31G X 5/16" 0.5 ML MISC USE AS DIRECTED TWICE DAILY 200 each 0   Blood Glucose Monitoring Suppl (BLOOD GLUCOSE SYSTEM PAK) KIT Please dispense as One Touch Ultra. Use as directed to monitor FSBS 4x daily. Dx: E11.65 1 kit 1   calcium carbonate (TUMS - DOSED IN MG ELEMENTAL CALCIUM) 500 MG chewable tablet Chew 3 tablets by mouth as needed for heartburn or  indigestion.     cetirizine (ZYRTEC) 10 MG tablet Take 1 tablet by mouth once daily 90 tablet 0   Continuous Blood Gluc Receiver (FREESTYLE LIBRE 3 READER) DEVI 1 Device by Does not apply route daily. Use to continuously check blood sugar daily. 1 each 1   Continuous Blood Gluc Sensor (FREESTYLE LIBRE 3 SENSOR) MISC Place 1 sensor on the skin every 14 days. Use to check glucose continuously 2 each 5   CREON [id number] units CPEP Take 1 capsule (24,000 Units total) by mouth See admin instructions. TAKE 2 CAPSULES BY MOUTH WITH MEALS AND 1 CAP WITH SNACKS x 2     famotidine (PEPCID) 40 MG tablet Take 1 tablet by mouth once daily 90 tablet 0   ferrous sulfate 325 (65 FE) MG tablet Take 325 mg by  mouth daily with breakfast.     FLUoxetine (PROZAC) 20 MG capsule Take 1 capsule by mouth once daily 90 capsule 0   furosemide (LASIX) 20 MG tablet Take 1 tablet (20 mg total) by mouth See admin instructions. Take 40 mg in the morning and 20 mg at 4 pm.     gabapentin (NEURONTIN) 100 MG capsule TAKE 1 CAPSULE BY MOUTH THREE TIMES DAILY AS NEEDED FOR NERVE PAIN 90 capsule 2   insulin NPH-regular Human (NOVOLIN 70/30 RELION) (70-30) 100 UNIT/ML injection Inject 20-24 Units into the skin See admin instructions. INJECT 24 UNITS SUBCUTANEOUSLY IN THE MORNING AND 20 IN THE EVENING ( Sliding Scale) Checks 2 hours after pt eats.     Lancets Misc. MISC Pt has Accu Chek Aviva Plus - Needs just lancets  Checks BS 4-5 times per day. Disp 3 boxes(90 day Supply)/4 refills Dx code. E11.9 450 each 3   LORazepam (ATIVAN) 1 MG tablet TAKE 1/2 TO 1 (ONE-HALF TO ONE) TABLET BY MOUTH TWICE DAILY AS NEEDED 30 tablet 0   MAGnesium-Oxide 400 (240 Mg) MG tablet TAKE 1 TABLET BY MOUTH ONCE DAILY . APPOINTMENT REQUIRED FOR FUTURE REFILLS 30 tablet 3   metoprolol tartrate (LOPRESSOR) 50 MG tablet Take 1 tablet (50 mg total) by mouth 2 (two) times daily. 60 tablet 3   Multiple Vitamin (MULTIVITAMIN) capsule Take 1 capsule by mouth every morning.      OneTouch Delica Lancets 33G MISC CHECK BLOOD SUGAR 4-5 TIMES PER DAY 300 each 0   ONETOUCH ULTRA TEST test strip USE 1 STRIP TO CHECK GLUCOSE FIVE TIMES DAILY 300 each 0   pantoprazole (PROTONIX) 40 MG tablet Take 1 tablet (40 mg total) by mouth daily. 90 tablet 3   potassium chloride (KLOR-CON) 10 MEQ tablet Take 1 tablet (10 mEq total) by mouth daily. 90 tablet 3   predniSONE (DELTASONE) 20 MG tablet Take 1 tablet (20 mg total) by mouth daily with breakfast. 5 tablet 0   Propylene Glycol-Glycerin (SOOTHE OP) Apply 2 drops to eye 2 (two) times daily.     rosuvastatin (CRESTOR) 5 MG tablet Take 1 tablet by mouth once daily (Patient taking differently: Take 5 mg by mouth every  evening.) 90 tablet 3   triamcinolone cream (KENALOG) 0.1 % Apply topically 2 (two) times daily. 60 g 0   No current facility-administered medications on file prior to visit.   Allergies  Allergen Reactions   Calcium-Containing Compounds Nausea Only   Morphine And Codeine Other (See Comments)    Hallucination   Raloxifene Itching    Evista- Face and eyes burning   Vitamin D Analogs Nausea Only   Social History  Socioeconomic History   Marital status: Widowed    Spouse name: Not on file   Number of children: 5   Years of education: Not on file   Highest education level: Not on file  Occupational History   Occupation: homemaker  Tobacco Use   Smoking status: Never   Smokeless tobacco: Never  Vaping Use   Vaping Use: Never used  Substance and Sexual Activity   Alcohol use: No    Alcohol/week: 0.0 standard drinks of alcohol   Drug use: No   Sexual activity: Not Currently    Birth control/protection: Post-menopausal  Other Topics Concern   Not on file  Social History Narrative   Lives in [place].   Social Determinants of Health   Financial Resource Strain: Not on file  Food Insecurity: No Food Insecurity (04/15/2022)   Hunger Vital Sign    Worried About Running Out of Food in the Last Year: Never true    Ran Out of Food in the Last Year: Never true  Transportation Needs: No Transportation Needs (04/15/2022)   PRAPARE - Administrator, Civil Service (Medical): No    Lack of Transportation (Non-Medical): No  Physical Activity: Not on file  Stress: Not on file  Social Connections: Not on file  Intimate Partner Violence: Not At Risk (04/15/2022)   Humiliation, Afraid, Rape, and Kick questionnaire    Fear of Current or Ex-Partner: No    Emotionally Abused: No    Physically Abused: No    Sexually Abused: No          Review of Systems  All other systems reviewed and are negative.      Objective:   Physical Exam Constitutional:      General:  She is not in acute distress.    Appearance: Normal appearance. She is normal weight. She is not ill-appearing or toxic-appearing.  Cardiovascular:     Rate and Rhythm: Normal rate. Rhythm irregular.     Heart sounds: Normal heart sounds. No murmur heard.    No friction rub. No gallop.  Pulmonary:     Effort: Pulmonary effort is normal. No respiratory distress.     Breath sounds: No stridor. No wheezing, rhonchi or rales.  Chest:     Chest wall: No tenderness.  Abdominal:     General: Abdomen is flat. Bowel sounds are normal. There is no distension.     Palpations: Abdomen is soft. There is no mass.     Tenderness: There is no abdominal tenderness.  Musculoskeletal:     Right lower leg: No edema.     Left lower leg: No edema.  Skin:    Findings: No erythema or rash.  Neurological:     General: No focal deficit present.     Mental Status: She is alert and oriented to person, place, and time. Mental status is at baseline.     Cranial Nerves: No cranial nerve deficit.     Sensory: No sensory deficit.     Motor: Weakness present.     Coordination: Coordination normal.        Assessment & Plan:  Type 2 diabetes mellitus with other specified complication, with long-term current use of insulin (HCC) Recommended increasing her insulin to 40 units in the morning and 30 units in the evening.  Monitor sugars for the next 3 days and then uptitrate insulin as necessary.  Increase Am insulin by 2 units if morning blood sugars are not under 130.  Increase evening  insulin by 2 units if afternoon and evening sugars are not under 180.  Repeat this process every 3 days until sugars are more in line.  Recheck via telephone in 1 to 2 weeks

## 2022-08-20 ENCOUNTER — Encounter: Payer: Self-pay | Admitting: Gastroenterology

## 2022-08-23 ENCOUNTER — Other Ambulatory Visit: Payer: Self-pay | Admitting: Cardiology

## 2022-08-26 ENCOUNTER — Ambulatory Visit: Payer: PPO | Admitting: Family Medicine

## 2022-08-29 ENCOUNTER — Other Ambulatory Visit: Payer: Self-pay | Admitting: Family Medicine

## 2022-08-29 NOTE — Telephone Encounter (Signed)
Requested Prescriptions  Pending Prescriptions Disp Refills   cetirizine (ZYRTEC) 10 MG tablet [Pharmacy Med Name: Cetirizine HCl 10 MG Oral Tablet] 90 tablet 0    Sig: Take 1 tablet by mouth once daily     Ear, Nose, and Throat:  Antihistamines 2 Failed - 08/29/2022  6:52 AM      Failed - Cr in normal range and within 360 days    Creat  Date Value Ref Range Status  06/23/2022 1.39 (H) 0.60 - 0.95 mg/dL Final         Failed - Valid encounter within last 12 months    Recent Outpatient Visits           1 year ago Type 2 diabetes mellitus with other specified complication, with long-term current use of insulin (HCC)   [facility] Pickard, Priscille Heidelberg, MD   1 year ago Type 2 diabetes mellitus with other specified complication, with long-term current use of insulin (HCC)   [facility] Donita Brooks, MD   2 years ago Type 2 diabetes mellitus with other specified complication, with long-term current use of insulin (HCC)   [facility] Pickard, Priscille Heidelberg, MD   2 years ago Hospital discharge follow-up   [facility] Donita Brooks, MD   3 years ago Acute pain of both knees   [facility] Pickard, Priscille Heidelberg, MD       Future Appointments             In 1 month Strader, Lennart Pall, PA-C [facility], [place] [address]

## 2022-09-10 ENCOUNTER — Other Ambulatory Visit: Payer: Self-pay | Admitting: Family Medicine

## 2022-09-13 ENCOUNTER — Other Ambulatory Visit: Payer: Self-pay | Admitting: Family Medicine

## 2022-09-15 NOTE — Telephone Encounter (Signed)
Requested Prescriptions  Pending Prescriptions Disp Refills   FLUoxetine (PROZAC) 20 MG capsule [Pharmacy Med Name: FLUoxetine HCl 20 MG Oral Capsule] 90 capsule 1    Sig: Take 1 capsule by mouth once daily     Psychiatry:  Antidepressants - SSRI Failed - 09/13/2022  6:52 AM      Failed - Valid encounter within last 6 months    Recent Outpatient Visits           1 year ago Type 2 diabetes mellitus with other specified complication, with long-term current use of insulin (HCC)   [facility] Pickard, Priscille Heidelberg, MD   1 year ago Type 2 diabetes mellitus with other specified complication, with long-term current use of insulin (HCC)   [facility] Donita Brooks, MD   2 years ago Type 2 diabetes mellitus with other specified complication, with long-term current use of insulin (HCC)   [facility] Pickard, Priscille Heidelberg, MD   2 years ago Hospital discharge follow-up   [facility] Donita Brooks, MD   3 years ago Acute pain of both knees   [facility] Pickard, Priscille Heidelberg, MD       Future Appointments             In 2 weeks Strader, Lennart Pall, PA-C [place], [place] [address]

## 2022-09-26 ENCOUNTER — Other Ambulatory Visit: Payer: Self-pay | Admitting: Family Medicine

## 2022-09-26 NOTE — Telephone Encounter (Signed)
Requested Prescriptions  Pending Prescriptions Disp Refills   ONETOUCH ULTRA TEST test strip [Pharmacy Med Name: OneTouch Ultra Blue In Vitro Strip] 300 each 0    Sig: USE 1 STRIP TO CHECK GLUCOSE FIVE TIMES DAILY     There is no refill protocol information for this order

## 2022-10-01 ENCOUNTER — Encounter: Payer: Self-pay | Admitting: Student

## 2022-10-01 ENCOUNTER — Ambulatory Visit: Payer: PPO | Attending: Student | Admitting: Student

## 2022-10-01 VITALS — BP 118/70 | HR 76 | Ht 63.0 in | Wt 147.0 lb

## 2022-10-01 DIAGNOSIS — I059 Rheumatic mitral valve disease, unspecified: Secondary | ICD-10-CM | POA: Diagnosis not present

## 2022-10-01 DIAGNOSIS — I2510 Atherosclerotic heart disease of native coronary artery without angina pectoris: Secondary | ICD-10-CM | POA: Diagnosis not present

## 2022-10-01 DIAGNOSIS — I4811 Longstanding persistent atrial fibrillation: Secondary | ICD-10-CM | POA: Diagnosis not present

## 2022-10-01 DIAGNOSIS — N1832 Chronic kidney disease, stage 3b: Secondary | ICD-10-CM

## 2022-10-01 DIAGNOSIS — I5032 Chronic diastolic (congestive) heart failure: Secondary | ICD-10-CM

## 2022-10-01 DIAGNOSIS — I10 Essential (primary) hypertension: Secondary | ICD-10-CM

## 2022-10-01 DIAGNOSIS — E785 Hyperlipidemia, unspecified: Secondary | ICD-10-CM

## 2022-10-01 MED ORDER — METOPROLOL TARTRATE 50 MG PO TABS: 50.0000 mg | ORAL_TABLET | Freq: Two times a day (BID) | ORAL | 3 refills | Status: DC

## 2022-10-01 MED ORDER — APIXABAN 5 MG PO TABS
5.0000 mg | ORAL_TABLET | Freq: Two times a day (BID) | ORAL | 3 refills | Status: DC
Start: 2022-10-01 — End: 2022-11-11

## 2022-10-01 NOTE — Patient Instructions (Signed)
Medication Instructions:   Continue current medication regimen.   *If you need a refill on your cardiac medications before your next appointment, please call your pharmacy*   Follow-Up: At [facility], you and your health needs are our priority.  As part of our continuing mission to provide you with exceptional heart care, we have created designated Provider Care Teams.  These Care Teams include your primary Cardiologist (physician) and Advanced Practice Providers (APPs -  Physician Assistants and Nurse Practitioners) who all work together to provide you with the care you need, when you need it.  We recommend signing up for the patient portal called "MyChart".  Sign up information is provided on this After Visit Summary.  MyChart is used to connect with patients for Virtual Visits (Telemedicine).  Patients are able to view lab/test results, encounter notes, upcoming appointments, etc.  Non-urgent messages can be sent to your provider as well.   To learn more about what you can do with MyChart, go to [email or web address].    Your next appointment:   6 month(s)  Provider:   You may see Dina Rich, MD or one of the following Advanced Practice Providers on your designated Care Team:   [place], PA-C  Jacolyn Reedy, [place]

## 2022-10-01 NOTE — Progress Notes (Signed)
Cardiology Office Note    Date:  10/01/2022  ID:  Misty Edwards, DOB 01/29/1927, MRN [id number] Cardiologist: Dina Rich, MD    History of Present Illness:    Misty Edwards is a 89 y.o. female with past medical history of CAD (s/p CABG in 08/2014 with LIMA-LAD, SVG-OM and SVG-PDA), chronic HFpEF, persistent atrial fibrillation, HTN, HLD, Type 2 DM, moderate MR and Stage 3 CKD who presents to the office today for 71-month follow-up.   She was last examined by Dr. Wyline Mood in 05/2022 and was using 2 L nasal cannula at home and was taking Lasix 40 mg in AM/20 mg in PM. Her weight was stable at 150 lbs and she was continued on her current diuretic. Given that she remained in persistent atrial fibrillation despite being on Amiodarone, this was discontinued with rate-control recommended.   In talking with the patient and her grandson today, she reports things have overall been going well since her last office visit. Her grandson and his wife do stay with her a majority of the time and she also has an aide that helps out during the day. She uses a walker for ambulation and does report 1 mechanical fall in 07/2022.  No associated dizziness or presyncope at that time. She denies any recent exertional chest pain or dyspnea on exertion. Says that she was having acid reflux as she would have discomfort and this would improve with belching. Was started on Protonix and symptoms resolved. No recent palpitations, orthopnea or PND. No longer using supplemental oxygen. She does experience intermittent lower extremity edema and checks her weight daily and takes an extra 20 mg of Lasix if needed for weight gain. Her grandson reports they try to follow a low-sodium diet and she consumes less than 1500 mg of sodium daily.  Studies Reviewed:   EKG: EKG is not ordered today.   Echocardiogram: 02/2022 IMPRESSIONS     1. Left ventricular ejection fraction, by estimation, is 60 to 65%. The  left ventricle has  normal function. The left ventricle demonstrates  regional wall motion abnormalities (see scoring diagram/findings for  description). There is moderate asymmetric  left ventricular hypertrophy of the basal-septal segment. Left ventricular  diastolic parameters are indeterminate.   2. Right ventricular systolic function is mildly reduced. The right  ventricular size is normal. There is normal pulmonary artery systolic  pressure. The estimated right ventricular systolic pressure is 31.8 mmHg.   3. Left atrial size was moderately dilated.   4. At least moderate mitral regurgitation with horizontal color splay and  with Doppler evidnece of right sided pulmonary vein flow reversal. The  mitral valve is normal in structure. Moderate mitral valve regurgitation.   5. The aortic valve is tricuspid. Aortic valve regurgitation is trivial.  No aortic stenosis is present.   Comparison(s): Mitral regurgitation has increased.   Risk Assessment/Calculations:    CHA2DS2-VASc Score = 6   This indicates a 9.7% annual risk of stroke. The patient's score is based upon: CHF History: 0 HTN History: 1 Diabetes History: 1 Stroke History: 0 Vascular Disease History: 1 Age Score: 2 Gender Score: 1    Physical Exam:   VS:  BP 118/70   Pulse 76   Ht 5\' 3"  (1.6 m)   Wt 147 lb (66.7 kg)   SpO2 97%   BMI 26.04 kg/m    Wt Readings from Last 3 Encounters:  10/01/22 147 lb (66.7 kg)  08/15/22 141 lb (64 kg)  08/09/22 143  lb (64.9 kg)     GEN: Pleasant, elderly female appearing in no acute distress NECK: No JVD; No carotid bruits CARDIAC: Irregularly irregular, 2/6 systolic murmur along Apex.  RESPIRATORY:  Clear to auscultation without rales, wheezing or rhonchi  ABDOMEN: Appears non-distended. No obvious abdominal masses. EXTREMITIES: No clubbing or cyanosis. No pitting edema.  Distal pedal pulses are 2+ bilaterally.   Assessment and Plan:   1. Chronic HFpEF - Echocardiogram in 02/2022 showed  a preserved EF of 60 to 65% and mildly reduced RV function. She appears euvolemic by examination today and follows a low-sodium diet. Given stable renal function by recent labs and since her weight has been stable on her home scales, will continue Lasix at her current dose of 40 mg in AM/20 mg in PM. She does take an extra 20 mg if needed for weight gain.  2. CAD - She is s/p CABG in 08/2014 with LIMA-LAD, SVG-OM and SVG-PDA. She denies any recent anginal symptoms. Continue current medical therapy with Crestor 5 mg daily and Lopressor 50 mg twice daily. She is no longer on ASA given the need for anticoagulation.  3. Persistent Atrial Fibrillation - Rates are in the 70's during today's visit. Continue Lopressor 50mg  BID for rate-control. - Remains on Eliquis 5mg  BID for anticoagulation which is the appropriate dose at this time given her age, weight and renal function. No reports of active bleeding. CBC in 06/2022 showed her Hgb was stable at 11.0 with platelets at 159 K.   4. Mitral Regurgitation - This was moderate by echo in 02/2022. Would plan to arrange for follow-up imaging at the time of her next office visit. Would anticipate conservative management given her age unless she develops significant symptoms.   5. HTN - BP is well-controlled at 118/70 during today's visit. Continue current medical therapy with Lasix and Lopressor.   6. HLD - LDL was well-controlled at 23 in 03/2022. Continue current medical therapy with Crestor 5mg  daily.   7. Stage 3 CKD - Baseline creatinine 1.3 - 1.4. Stable at 1.39 in 06/2022.   Signed, Ellsworth Lennox, PA-C

## 2022-10-12 ENCOUNTER — Other Ambulatory Visit: Payer: Self-pay | Admitting: Family Medicine

## 2022-10-20 ENCOUNTER — Telehealth: Payer: Self-pay | Admitting: Family Medicine

## 2022-10-20 NOTE — Telephone Encounter (Signed)
Prescription Request  10/20/2022  LOV: 08/15/2022  What is the name of the medication or equipment?   Continuous Blood Gluc Sensor (FREESTYLE LIBRE 3 SENSOR) MISC  **PA needed**  Have you contacted your pharmacy to request a refill? Yes   Which pharmacy would you like this sent to?    [facility] [address] [place] - [address] [place] [address] Phone: [phone] Fax: [phone]  Patient notified that their request is being sent to the clinical staff for review and that they should receive a response within 2 business days.   Please advise pharmacist when PA approved.

## 2022-10-22 ENCOUNTER — Other Ambulatory Visit: Payer: Self-pay

## 2022-10-22 DIAGNOSIS — N289 Disorder of kidney and ureter, unspecified: Secondary | ICD-10-CM

## 2022-10-22 DIAGNOSIS — N1832 Chronic kidney disease, stage 3b: Secondary | ICD-10-CM

## 2022-10-22 DIAGNOSIS — E1169 Type 2 diabetes mellitus with other specified complication: Secondary | ICD-10-CM

## 2022-10-22 MED ORDER — FREESTYLE LIBRE 3 SENSOR MISC
5 refills | Status: DC
Start: 2022-10-22 — End: 2023-04-17

## 2022-10-22 NOTE — Telephone Encounter (Signed)
[facility] called back states patients son is upset that this hasn't been called in. I seen where a PA was started for this patient. They ask if we could please send the prescription in with refills so that when the PA is approved they can go ahead and fill for patient.

## 2022-10-23 ENCOUNTER — Ambulatory Visit: Payer: PPO | Admitting: Family Medicine

## 2022-10-27 ENCOUNTER — Ambulatory Visit: Payer: PPO | Admitting: Family Medicine

## 2022-11-06 ENCOUNTER — Ambulatory Visit (INDEPENDENT_AMBULATORY_CARE_PROVIDER_SITE_OTHER): Payer: PPO | Admitting: Family Medicine

## 2022-11-06 VITALS — BP 118/62 | HR 68 | Temp 97.5°F | Ht 63.0 in | Wt 147.0 lb

## 2022-11-06 DIAGNOSIS — E1169 Type 2 diabetes mellitus with other specified complication: Secondary | ICD-10-CM

## 2022-11-06 DIAGNOSIS — Z794 Long term (current) use of insulin: Secondary | ICD-10-CM

## 2022-11-06 DIAGNOSIS — N1832 Chronic kidney disease, stage 3b: Secondary | ICD-10-CM | POA: Diagnosis not present

## 2022-11-06 DIAGNOSIS — I4891 Unspecified atrial fibrillation: Secondary | ICD-10-CM

## 2022-11-06 DIAGNOSIS — I509 Heart failure, unspecified: Secondary | ICD-10-CM

## 2022-11-06 NOTE — Progress Notes (Signed)
Subjective:    Patient ID: Misty Edwards, female    DOB: 07/02/1926, 89 y.o.   MRN: [id number]  Here for follow up on 33 units am and 12 units PM.  50% of sugars 70-180, 50 % 180-250.  Patient is using continuous blood glucose monitoring.  0% of her sugars are under 80.  The trend shows that her sugars are high from lunchtime until 8 PM.  She denies any chest pain shortness of breath or dyspnea on exertion.  There is no pitting edema in her legs and her lungs are clear.  She is currently taking 40 mg of Lasix in the morning and 20 mg at night.  She appears euvolemic.  Past Medical History:  Diagnosis Date   Acute biliary pancreatitis 07/2002   thia was in 07/2004:she still has pseudocyst in tail of pancreas measuring 54 x 33 mm    Adrenal adenoma    bilateral   Anxiety    CAD (coronary artery disease)    a. s/p CABG in 08/2014 with LIMA-LAD, SVG-OM and SVG-PDA   Chronic pancreatitis (HCC)    Detached retina    Diabetes mellitus (HCC)    Diverticulosis    Heartburn    History of carcinoma in situ of breast 1988   Hyperlipidemia    Hypertension    IBS (irritable bowel syndrome)    Legally blind    Osteoarthritis    Osteopenia    Upper GI bleed September2004   secondary to gastritis   Past Surgical History:  Procedure Laterality Date   CARDIAC CATHETERIZATION N/A 08/24/2014   Procedure: Left Heart Cath and Coronary Angiography;  Surgeon: Marykay Lex, MD;  Location: [facility] INVASIVE CV LAB;  Service: Cardiovascular;  Laterality: N/A;   COLONOSCOPY  08/15/05   few tiny diverticula at sigmoid colon/external hemorrhoids but no polyps   COLONOSCOPY  01/08/2012   [id number] diverticulosis. Next colonoscopy in 12/2016   CORONARY ARTERY BYPASS GRAFT N/A 08/24/2014   Procedure: CORONARY ARTERY BYPASS GRAFTING (CABG) x three, using left internal mammary artery and right leg greater saphenous vein harvested endoscopically;  Surgeon: Kerin Perna, MD;  Location: [facility] OR;  Service: Open Heart  Surgery;  Laterality: N/A;   ECTOPIC PREGNANCY SURGERY  1950's   ERCP with sphincterotomy  07/2002   ESOPHAGOGASTRODUODENOSCOPY      Gastritis of body.  Otherwise normal   ESOPHAGOGASTRODUODENOSCOPY  01/08/2012   RMR: Few scattered gastric erosions of uncertain significance-status post biopsy. Minimal chronic inflammation, no H.pylori   LAPAROSCOPIC CHOLECYSTECTOMY     MASTECTOMY Right 1980   PANCREATIC PSEUDOCYST DRAINAGE  [id number]   drained percutaneously    right mastectomy     tacking up of her bladder     TEE WITHOUT CARDIOVERSION N/A 08/24/2014   Procedure: TRANSESOPHAGEAL ECHOCARDIOGRAM (TEE);  Surgeon: Kerin Perna, MD;  Location: [facility] OR;  Service: Open Heart Surgery;  Laterality: N/A;   Current Outpatient Medications on File Prior to Visit  Medication Sig Dispense Refill   apixaban (ELIQUIS) 5 MG TABS tablet Take 1 tablet (5 mg total) by mouth 2 (two) times daily. 180 tablet 3   BD INSULIN SYRINGE U/F 31G X 5/16" 0.5 ML MISC USE AS DIRECTED TWICE DAILY 200 each 0   Blood Glucose Monitoring Suppl (BLOOD GLUCOSE SYSTEM PAK) KIT Please dispense as One Touch Ultra. Use as directed to monitor FSBS 4x daily. Dx: E11.65 1 kit 1   calcium carbonate (TUMS - DOSED IN MG ELEMENTAL CALCIUM) 500 MG  chewable tablet Chew 3 tablets by mouth as needed for heartburn or indigestion.     cetirizine (ZYRTEC) 10 MG tablet Take 1 tablet by mouth once daily 90 tablet 0   Continuous Blood Gluc Receiver (FREESTYLE LIBRE 3 READER) DEVI 1 Device by Does not apply route daily. Use to continuously check blood sugar daily. 1 each 1   Continuous Glucose Sensor (FREESTYLE LIBRE 3 SENSOR) MISC Place 1 sensor on the skin every 14 days. Use to check glucose continuously 2 each 5   CREON [id number] units CPEP Take 1 capsule (24,000 Units total) by mouth See admin instructions. TAKE 2 CAPSULES BY MOUTH WITH MEALS AND 1 CAP WITH SNACKS x 2     famotidine (PEPCID) 40 MG tablet Take 1 tablet by mouth once daily 90 tablet  0   ferrous sulfate 325 (65 FE) MG tablet Take 325 mg by mouth daily with breakfast.     FLUoxetine (PROZAC) 20 MG capsule Take 1 capsule by mouth once daily 90 capsule 1   furosemide (LASIX) 20 MG tablet Take 1 tablet (20 mg total) by mouth See admin instructions. Take 40 mg in the morning and 20 mg at 4 pm.     gabapentin (NEURONTIN) 100 MG capsule TAKE 1 CAPSULE BY MOUTH THREE TIMES DAILY AS NEEDED FOR NERVE PAIN 90 capsule 2   insulin NPH-regular Human (NOVOLIN 70/30 RELION) (70-30) 100 UNIT/ML injection Inject 20-24 Units into the skin See admin instructions. INJECT 24 UNITS SUBCUTANEOUSLY IN THE MORNING AND 20 IN THE EVENING ( Sliding Scale) Checks 2 hours after pt eats.     Lancets Misc. MISC Pt has Accu Chek Aviva Plus - Needs just lancets  Checks BS 4-5 times per day. Disp 3 boxes(90 day Supply)/4 refills Dx code. E11.9 450 each 3   LORazepam (ATIVAN) 1 MG tablet TAKE 1/2 TO 1 (ONE-HALF TO ONE) TABLET BY MOUTH TWICE DAILY AS NEEDED 30 tablet 0   MAGnesium-Oxide 400 (240 Mg) MG tablet TAKE 1 TABLET BY MOUTH ONCE DAILY . APPOINTMENT REQUIRED FOR FUTURE REFILLS 30 tablet 3   metoprolol tartrate (LOPRESSOR) 50 MG tablet Take 1 tablet (50 mg total) by mouth 2 (two) times daily. 180 tablet 3   Multiple Vitamin (MULTIVITAMIN) capsule Take 1 capsule by mouth every morning.      OneTouch Delica Lancets 33G MISC CHECK BLOOD SUGAR 4-5 TIMES PER DAY 300 each 0   ONETOUCH ULTRA TEST test strip USE 1 STRIP TO CHECK GLUCOSE FIVE TIMES DAILY 300 each 0   pantoprazole (PROTONIX) 40 MG tablet Take 1 tablet (40 mg total) by mouth daily. 90 tablet 3   potassium chloride (KLOR-CON) 10 MEQ tablet Take 1 tablet (10 mEq total) by mouth daily. 90 tablet 3   predniSONE (DELTASONE) 20 MG tablet Take 1 tablet (20 mg total) by mouth daily with breakfast. (Patient not taking: Reported on 10/01/2022) 5 tablet 0   Propylene Glycol-Glycerin (SOOTHE OP) Apply 2 drops to eye 2 (two) times daily.     rosuvastatin (CRESTOR) 5  MG tablet Take 1 tablet by mouth once daily (Patient taking differently: Take 5 mg by mouth every evening.) 90 tablet 3   triamcinolone cream (KENALOG) 0.1 % Apply topically 2 (two) times daily. 60 g 0   No current facility-administered medications on file prior to visit.   Allergies  Allergen Reactions   Calcium-Containing Compounds Nausea Only   Morphine And Codeine Other (See Comments)    Hallucination   Raloxifene Itching  Evista- Face and eyes burning   Vitamin D Analogs Nausea Only   Social History   Socioeconomic History   Marital status: Widowed    Spouse name: Not on file   Number of children: 5   Years of education: Not on file   Highest education level: Not on file  Occupational History   Occupation: homemaker  Tobacco Use   Smoking status: Never   Smokeless tobacco: Never  Vaping Use   Vaping status: Never Used  Substance and Sexual Activity   Alcohol use: No    Alcohol/week: 0.0 standard drinks of alcohol   Drug use: No   Sexual activity: Not Currently    Birth control/protection: Post-menopausal  Other Topics Concern   Not on file  Social History Narrative   Lives in [place].   Social Determinants of Health   Financial Resource Strain: Not on file  Food Insecurity: No Food Insecurity (04/15/2022)   Hunger Vital Sign    Worried About Running Out of Food in the Last Year: Never true    Ran Out of Food in the Last Year: Never true  Transportation Needs: No Transportation Needs (04/15/2022)   PRAPARE - Administrator, Civil Service (Medical): No    Lack of Transportation (Non-Medical): No  Physical Activity: Not on file  Stress: Not on file  Social Connections: Not on file  Intimate Partner Violence: Not At Risk (04/15/2022)   Humiliation, Afraid, Rape, and Kick questionnaire    Fear of Current or Ex-Partner: No    Emotionally Abused: No    Physically Abused: No    Sexually Abused: No          Review of Systems  All other  systems reviewed and are negative.      Objective:   Physical Exam Constitutional:      General: She is not in acute distress.    Appearance: Normal appearance. She is normal weight. She is not ill-appearing or toxic-appearing.  Cardiovascular:     Rate and Rhythm: Normal rate. Rhythm irregular.     Heart sounds: Normal heart sounds. No murmur heard.    No friction rub. No gallop.  Pulmonary:     Effort: Pulmonary effort is normal. No respiratory distress.     Breath sounds: No stridor. No wheezing, rhonchi or rales.  Chest:     Chest wall: No tenderness.  Abdominal:     General: Abdomen is flat. Bowel sounds are normal. There is no distension.     Palpations: Abdomen is soft. There is no mass.     Tenderness: There is no abdominal tenderness.  Musculoskeletal:     Right lower leg: No edema.     Left lower leg: No edema.  Skin:    Findings: No erythema or rash.  Neurological:     General: No focal deficit present.     Mental Status: She is alert and oriented to person, place, and time. Mental status is at baseline.     Cranial Nerves: No cranial nerve deficit.     Sensory: No sensory deficit.     Motor: Weakness present.     Coordination: Coordination normal.   Wartlike lesion on the right side of her nasal bridge.  She would like this treated     Assessment & Plan:  Type 2 diabetes mellitus with other specified complication, with long-term current use of insulin (HCC) - Plan: CBC with Differential/Platelet, COMPLETE METABOLIC PANEL WITH GFR, Hemoglobin A1c, Lipid panel  Stage 3b chronic kidney disease (HCC)  Congestive heart failure, unspecified HF chronicity, unspecified heart failure type (HCC)  Atrial fibrillation, unspecified type (HCC) Blood pressure today is well-controlled.  In atrial fibrillation but normal rate.  Increase 70/30 insulin to begin in the morning and 2 units evening.  Try to get 70% of her blood sugars between 70 and 180.  Want to avoid  hypoglycemia.  Appears euvolemic.  Monitor renal function.  If renal function is stable we will make no changes of Lasix.  Treated the 4 mm wartlike lesion on the right nasal bridge with liquid nitrogen cryotherapy for 30 seconds.  If persistent recommend shave biopsy

## 2022-11-07 LAB — CBC WITH DIFFERENTIAL/PLATELET
Absolute Monocytes: 508 {cells}/uL (ref 200–950)
Basophils Absolute: 40 {cells}/uL (ref 0–200)
Basophils Relative: 0.6 %
Eosinophils Absolute: 40 {cells}/uL (ref 15–500)
Eosinophils Relative: 0.6 %
HCT: 39.3 % (ref 35.0–45.0)
Hemoglobin: 12.7 g/dL (ref 11.7–15.5)
Lymphs Abs: 2158 {cells}/uL (ref 850–3900)
MCH: 28.4 pg (ref 27.0–33.0)
MCHC: 32.3 g/dL (ref 32.0–36.0)
MCV: 87.9 fL (ref 80.0–100.0)
MPV: 11.7 fL (ref 7.5–12.5)
Monocytes Relative: 7.7 %
Neutro Abs: 3854 {cells}/uL (ref 1500–7800)
Neutrophils Relative %: 58.4 %
Platelets: 137 10*3/uL — ABNORMAL LOW (ref 140–400)
RBC: 4.47 10*6/uL (ref 3.80–5.10)
RDW: 13.6 % (ref 11.0–15.0)
Total Lymphocyte: 32.7 %
WBC: 6.6 10*3/uL (ref 3.8–10.8)

## 2022-11-07 LAB — HEMOGLOBIN A1C
Hgb A1c MFr Bld: 7 %{Hb} — ABNORMAL HIGH (ref <5.7)
Mean Plasma Glucose: 154 mg/dL
eAG (mmol/L): 8.5 mmol/L

## 2022-11-07 LAB — LIPID PANEL
Cholesterol: 119 mg/dL (ref <200)
HDL: 55 mg/dL (ref >=50)
LDL Cholesterol (Calc): 43 mg/dL
Non-HDL Cholesterol (Calc): 64 mg/dL (ref <130)
Total CHOL/HDL Ratio: 2.2 (calc) (ref <5.0)
Triglycerides: 130 mg/dL (ref <150)

## 2022-11-07 LAB — COMPLETE METABOLIC PANEL WITH GFR
AG Ratio: 1.4 (calc) (ref 1.0–2.5)
ALT: 31 U/L — ABNORMAL HIGH (ref 6–29)
AST: 41 U/L — ABNORMAL HIGH (ref 10–35)
Albumin: 4.2 g/dL (ref 3.6–5.1)
Alkaline phosphatase (APISO): 81 U/L (ref 37–153)
BUN/Creatinine Ratio: 27 (calc) — ABNORMAL HIGH (ref 6–22)
BUN: 39 mg/dL — ABNORMAL HIGH (ref 7–25)
CO2: 29 mmol/L (ref 20–32)
Calcium: 9.4 mg/dL (ref 8.6–10.4)
Chloride: 98 mmol/L (ref 98–110)
Creat: 1.43 mg/dL — ABNORMAL HIGH (ref 0.60–0.95)
Globulin: 2.9 g/dL (ref 1.9–3.7)
Glucose, Bld: 189 mg/dL — ABNORMAL HIGH (ref 65–99)
Potassium: 4.7 mmol/L (ref 3.5–5.3)
Sodium: 137 mmol/L (ref 135–146)
Total Bilirubin: 0.7 mg/dL (ref 0.2–1.2)
Total Protein: 7.1 g/dL (ref 6.1–8.1)
eGFR: 34 mL/min/{1.73_m2} — ABNORMAL LOW (ref >=60)

## 2022-11-11 ENCOUNTER — Other Ambulatory Visit: Payer: Self-pay | Admitting: Family Medicine

## 2022-11-11 ENCOUNTER — Other Ambulatory Visit: Payer: Self-pay

## 2022-11-11 ENCOUNTER — Telehealth: Payer: Self-pay

## 2022-11-11 DIAGNOSIS — K862 Cyst of pancreas: Secondary | ICD-10-CM

## 2022-11-11 DIAGNOSIS — J302 Other seasonal allergic rhinitis: Secondary | ICD-10-CM

## 2022-11-11 DIAGNOSIS — I10 Essential (primary) hypertension: Secondary | ICD-10-CM

## 2022-11-11 DIAGNOSIS — Z951 Presence of aortocoronary bypass graft: Secondary | ICD-10-CM

## 2022-11-11 DIAGNOSIS — R12 Heartburn: Secondary | ICD-10-CM

## 2022-11-11 DIAGNOSIS — F419 Anxiety disorder, unspecified: Secondary | ICD-10-CM

## 2022-11-11 DIAGNOSIS — I5032 Chronic diastolic (congestive) heart failure: Secondary | ICD-10-CM

## 2022-11-11 DIAGNOSIS — R0989 Other specified symptoms and signs involving the circulatory and respiratory systems: Secondary | ICD-10-CM

## 2022-11-11 DIAGNOSIS — E1169 Type 2 diabetes mellitus with other specified complication: Secondary | ICD-10-CM

## 2022-11-11 DIAGNOSIS — K861 Other chronic pancreatitis: Secondary | ICD-10-CM

## 2022-11-11 MED ORDER — FAMOTIDINE 40 MG PO TABS: 40.0000 mg | ORAL_TABLET | Freq: Every day | ORAL | 0 refills | Status: DC

## 2022-11-11 MED ORDER — ROSUVASTATIN CALCIUM 5 MG PO TABS
5.0000 mg | ORAL_TABLET | Freq: Every evening | ORAL | 1 refills | Status: DC
Start: 2022-11-11 — End: 2023-04-17

## 2022-11-11 MED ORDER — INSULIN SYRINGE-NEEDLE U-100 31G X 5/16" 0.5 ML MISC
3 refills | Status: DC
Start: 2022-11-11 — End: 2022-12-24

## 2022-11-11 MED ORDER — PANTOPRAZOLE SODIUM 40 MG PO TBEC
40.0000 mg | DELAYED_RELEASE_TABLET | Freq: Every day | ORAL | 3 refills | Status: DC
Start: 2022-11-11 — End: 2023-04-22

## 2022-11-11 MED ORDER — APIXABAN 5 MG PO TABS
5.0000 mg | ORAL_TABLET | Freq: Two times a day (BID) | ORAL | 1 refills | Status: DC
Start: 2022-11-11 — End: 2023-04-20

## 2022-11-11 MED ORDER — LORAZEPAM 1 MG PO TABS: ORAL_TABLET | ORAL | 2 refills | Status: DC

## 2022-11-11 MED ORDER — FUROSEMIDE 20 MG PO TABS
20.0000 mg | ORAL_TABLET | ORAL | Status: DC
Start: 2022-11-11 — End: 2023-04-22

## 2022-11-11 MED ORDER — CETIRIZINE HCL 10 MG PO TABS
10.0000 mg | ORAL_TABLET | Freq: Every day | ORAL | 1 refills | Status: DC
Start: 2022-11-11 — End: 2023-04-17

## 2022-11-11 MED ORDER — CREON 24000-76000 UNITS PO CPEP
24000.0000 [IU] | ORAL_CAPSULE | ORAL | 1 refills | Status: DC
Start: 2022-11-11 — End: 2023-03-17

## 2022-11-11 MED ORDER — GABAPENTIN 100 MG PO CAPS
100.0000 mg | ORAL_CAPSULE | Freq: Three times a day (TID) | ORAL | 2 refills | Status: DC
Start: 2022-11-11 — End: 2023-04-17

## 2022-11-11 MED ORDER — FLUOXETINE HCL 20 MG PO CAPS
20.0000 mg | ORAL_CAPSULE | Freq: Every day | ORAL | 1 refills | Status: DC
Start: 2022-11-11 — End: 2023-06-09

## 2022-11-11 MED ORDER — METOPROLOL TARTRATE 50 MG PO TABS
50.0000 mg | ORAL_TABLET | Freq: Two times a day (BID) | ORAL | 3 refills | Status: DC
Start: 2022-11-11 — End: 2023-04-20

## 2022-11-11 NOTE — Telephone Encounter (Signed)
Pt's grandson called requesting refills on all medications. Refills sent on all except pt's Lorazepam. Last RF was 10/13/2022. Thanks.

## 2022-11-11 NOTE — Telephone Encounter (Signed)
  Patient's grandson called to follow up on imaging order, and request for all meds to be refilled for 90 days including syringes.  Pharmacy confirmed as:   [facility] [address], [place] - [address] [place], [place] [address] Phone: [phone]  Fax: [phone]   Please advise at [phone].

## 2022-11-12 ENCOUNTER — Other Ambulatory Visit: Payer: Self-pay | Admitting: Family Medicine

## 2022-11-12 DIAGNOSIS — Z794 Long term (current) use of insulin: Secondary | ICD-10-CM

## 2022-11-12 DIAGNOSIS — E1169 Type 2 diabetes mellitus with other specified complication: Secondary | ICD-10-CM

## 2022-11-12 NOTE — Telephone Encounter (Signed)
Request was refilled 11/11/22, duplicate request.E-Prescribing Status: Receipt confirmed by pharmacy (11/11/2022  5:04 PM EDT).   Requested Prescriptions  Pending Prescriptions Disp Refills   LORazepam (ATIVAN) 1 MG tablet [Pharmacy Med Name: LORazepam 1 MG Oral Tablet] 30 tablet 0    Sig: TAKE 1/2 TO 1 (ONE-HALF TO ONE) TABLET BY MOUTH TWICE DAILY AS NEEDED     Not Delegated - Psychiatry: Anxiolytics/Hypnotics 2 Failed - 11/11/2022  9:12 AM      Failed - This refill cannot be delegated      Failed - Urine Drug Screen completed in last 360 days      Failed - Valid encounter within last 6 months    Recent Outpatient Visits           1 year ago Type 2 diabetes mellitus with other specified complication, with long-term current use of insulin (HCC)   [facility] Pickard, Priscille Heidelberg, MD   2 years ago Type 2 diabetes mellitus with other specified complication, with long-term current use of insulin (HCC)   [facility] Pickard, Priscille Heidelberg, MD   2 years ago Type 2 diabetes mellitus with other specified complication, with long-term current use of insulin (HCC)   [facility] Pickard, Priscille Heidelberg, MD   3 years ago Hospital discharge follow-up   [facility] Donita Brooks, MD   3 years ago Acute pain of both knees   [facility] Pickard, Priscille Heidelberg, MD       Future Appointments             In 3 months Pickard, Priscille Heidelberg, MD [facility]            Passed - Patient is not pregnant       famotidine (PEPCID) 40 MG tablet [Pharmacy Med Name: Famotidine 40 MG Oral Tablet] 90 tablet 0    Sig: Take 1 tablet by mouth once daily     Gastroenterology:  H2 Antagonists Failed - 11/11/2022  9:12 AM      Failed - Valid encounter within last 12 months    Recent Outpatient Visits           1 year ago Type 2 diabetes mellitus with other specified complication, with long-term current use of  insulin (HCC)   [facility] Pickard, Priscille Heidelberg, MD   2 years ago Type 2 diabetes mellitus with other specified complication, with long-term current use of insulin (HCC)   [facility] Pickard, Priscille Heidelberg, MD   2 years ago Type 2 diabetes mellitus with other specified complication, with long-term current use of insulin (HCC)   [facility] Pickard, Priscille Heidelberg, MD   3 years ago Hospital discharge follow-up   [facility] Donita Brooks, MD   3 years ago Acute pain of both knees   [facility] Pickard, Priscille Heidelberg, MD       Future Appointments             In 3 months Pickard, Priscille Heidelberg, MD [facility]

## 2022-11-13 NOTE — Telephone Encounter (Signed)
Called pharmacy to clarify if Rx received . Pharmacy staff reports Rx has been received 11/11/22

## 2022-11-13 NOTE — Telephone Encounter (Signed)
Requested by interface surescripts. Rx already signed 11/11/22 . Pharmacy called and has received Rx request.  Requested Prescriptions  Refused Prescriptions Disp Refills   BD INSULIN SYRINGE U/F 31G X 5/16" 0.5 ML MISC [Pharmacy Med Name: BD UF II SHORT 0.5CC SYR M] 200 each 0    Sig: USE AS DIRECTED TWICE DAILY     There is no refill protocol information for this order

## 2022-11-18 ENCOUNTER — Other Ambulatory Visit: Payer: Self-pay

## 2022-11-18 DIAGNOSIS — E1159 Type 2 diabetes mellitus with other circulatory complications: Secondary | ICD-10-CM

## 2022-11-18 MED ORDER — INSULIN SYRINGES (DISPOSABLE) U-100 1 ML MISC
3 refills | Status: AC
Start: 2022-11-18 — End: ?

## 2022-11-30 ENCOUNTER — Other Ambulatory Visit: Payer: Self-pay | Admitting: Family Medicine

## 2022-12-01 NOTE — Telephone Encounter (Signed)
Requested Prescriptions  Pending Prescriptions Disp Refills   glucose blood (ONETOUCH ULTRA TEST) test strip [Pharmacy Med Name: OneTouch Ultra Blue In Vitro Strip] 300 each 2    Sig: USE 1 STRIP TO CHECK GLUCOSE FIVE TIMES DAILY     There is no refill protocol information for this order

## 2022-12-03 ENCOUNTER — Ambulatory Visit (HOSPITAL_COMMUNITY)
Admission: RE | Admit: 2022-12-03 | Discharge: 2022-12-03 | Disposition: A | Discharge status: Home / Self Care | Payer: PPO | Source: Ambulatory Visit | Attending: Family Medicine | Admitting: Family Medicine

## 2022-12-03 DIAGNOSIS — E1159 Type 2 diabetes mellitus with other circulatory complications: Secondary | ICD-10-CM | POA: Insufficient documentation

## 2022-12-10 ENCOUNTER — Telehealth: Payer: Self-pay | Admitting: Family Medicine

## 2022-12-10 NOTE — Telephone Encounter (Signed)
Received call from [place] at pharmacy to follow up on refill requested for FreeStyle Libre 3; stated they don't have it in stock but instead have FreeStyle Libre 3 Plus which is compatible which lasts 15 days instead of 14 days   Pharmacy requesting new script.   Please send new script electronically, or fax to [phone].  Please advise pharmacy with questions at [phone].

## 2022-12-18 ENCOUNTER — Telehealth: Payer: Self-pay

## 2022-12-18 NOTE — Telephone Encounter (Signed)
[name] called in stating pt is stating that she has been feeling very nauseous. [name] would like to know if this med ondansetron [facility]) tablet 4 mg   [[id number]] could be called into pt's pharmacy. Please advise.  Please call pt's grandson Reed Breech at [phone]

## 2022-12-20 ENCOUNTER — Encounter: Payer: Self-pay | Admitting: Family Medicine

## 2022-12-22 ENCOUNTER — Other Ambulatory Visit: Payer: Self-pay

## 2022-12-24 ENCOUNTER — Other Ambulatory Visit: Payer: Self-pay

## 2022-12-24 ENCOUNTER — Other Ambulatory Visit: Payer: Self-pay | Admitting: Family Medicine

## 2022-12-24 DIAGNOSIS — E1169 Type 2 diabetes mellitus with other specified complication: Secondary | ICD-10-CM

## 2022-12-24 MED ORDER — ONDANSETRON HCL 4 MG PO TABS: 4.0000 mg | ORAL_TABLET | Freq: Three times a day (TID) | ORAL | 0 refills | Status: DC | PRN

## 2022-12-24 MED ORDER — INSULIN SYRINGE-NEEDLE U-100 31G X 5/16" 0.5 ML MISC
3 refills | Status: DC
Start: 2022-12-24 — End: 2023-04-17

## 2023-01-04 ENCOUNTER — Other Ambulatory Visit: Payer: Self-pay | Admitting: Cardiology

## 2023-02-04 ENCOUNTER — Other Ambulatory Visit: Payer: Self-pay | Admitting: Family Medicine

## 2023-02-05 NOTE — Telephone Encounter (Signed)
Requested medications are due for refill today.  yes  Requested medications are on the active medications list.  yes  Last refill. 11/11/2022 330 2 rf  Future visit scheduled.   yes  Notes to clinic.  Refill not delegated.    Requested Prescriptions  Pending Prescriptions Disp Refills   LORazepam (ATIVAN) 1 MG tablet [Pharmacy Med Name: LORazepam 1 MG Oral Tablet] 30 tablet 0    Sig: TAKE 1/2 TO 1 (ONE-HALF TO ONE) TABLET BY MOUTH TWICE DAILY AS NEEDED     Not Delegated - Psychiatry: Anxiolytics/Hypnotics 2 Failed - 02/04/2023 10:35 AM      Failed - This refill cannot be delegated      Failed - Urine Drug Screen completed in last 360 days      Failed - Valid encounter within last 6 months    Recent Outpatient Visits           1 year ago Type 2 diabetes mellitus with other specified complication, with long-term current use of insulin (HCC)   [facility] Pickard, Priscille Heidelberg, MD   2 years ago Type 2 diabetes mellitus with other specified complication, with long-term current use of insulin (HCC)   [facility] Donita Brooks, MD   3 years ago Type 2 diabetes mellitus with other specified complication, with long-term current use of insulin (HCC)   [facility] Pickard, Priscille Heidelberg, MD   3 years ago Hospital discharge follow-up   [facility] Donita Brooks, MD   3 years ago Acute pain of both knees   [facility] Pickard, Priscille Heidelberg, MD       Future Appointments             In 1 month Pickard, Priscille Heidelberg, MD [facility], [facility]            Passed - Patient is not pregnant

## 2023-02-06 ENCOUNTER — Encounter: Payer: Self-pay | Admitting: Family Medicine

## 2023-02-09 ENCOUNTER — Other Ambulatory Visit: Payer: Self-pay | Admitting: Family Medicine

## 2023-02-09 MED ORDER — LORAZEPAM 1 MG PO TABS: ORAL_TABLET | ORAL | 2 refills | Status: DC

## 2023-02-13 ENCOUNTER — Other Ambulatory Visit: Payer: Self-pay | Admitting: Family Medicine

## 2023-02-13 DIAGNOSIS — R12 Heartburn: Secondary | ICD-10-CM

## 2023-02-17 NOTE — Telephone Encounter (Signed)
pantoprazole (PROTONIX) 40 MG tablet 90 tablet 3 11/11/2022 --   Sig - Route: Take 1 tablet (40 mg total) by mouth daily. - Oral   Sent to pharmacy as: pantoprazole (PROTONIX) 40 MG tablet   E-Prescribing Status: Receipt confirmed by pharmacy (11/11/2022  3:59 PM EDT)     Requested Prescriptions  Pending Prescriptions Disp Refills   famotidine (PEPCID) 40 MG tablet [Pharmacy Med Name: Famotidine 40 MG Oral Tablet] 90 tablet 0    Sig: Take 1 tablet by mouth once daily     Gastroenterology:  H2 Antagonists Failed - 02/13/2023 12:53 PM      Failed - Valid encounter within last 12 months    Recent Outpatient Visits           1 year ago Type 2 diabetes mellitus with other specified complication, with long-term current use of insulin (HCC)   [facility] Pickard, Priscille Heidelberg, MD   2 years ago Type 2 diabetes mellitus with other specified complication, with long-term current use of insulin (HCC)   [facility] Donita Brooks, MD   3 years ago Type 2 diabetes mellitus with other specified complication, with long-term current use of insulin (HCC)   [facility] Pickard, Priscille Heidelberg, MD   3 years ago Hospital discharge follow-up   [facility] Donita Brooks, MD   3 years ago Acute pain of both knees   [facility] Pickard, Priscille Heidelberg, MD       Future Appointments             In 2 weeks Pickard, Priscille Heidelberg, MD [facility]

## 2023-02-20 ENCOUNTER — Telehealth: Payer: Self-pay | Admitting: Family Medicine

## 2023-02-20 NOTE — Telephone Encounter (Signed)
Patient's grandson Misty Edwards dropped off a double-sided form for provider to complete and sign for medication assistance.  Please fax to number on form [phone]) when completed, and advise Misty Edwards when form ready for pickup at [phone], or [phone].

## 2023-02-24 ENCOUNTER — Other Ambulatory Visit: Payer: Self-pay | Admitting: Cardiology

## 2023-02-24 ENCOUNTER — Ambulatory Visit (INDEPENDENT_AMBULATORY_CARE_PROVIDER_SITE_OTHER): Payer: PPO | Admitting: Family Medicine

## 2023-02-24 ENCOUNTER — Encounter: Payer: Self-pay | Admitting: Family Medicine

## 2023-02-24 VITALS — BP 138/82 | HR 92 | Ht 63.0 in | Wt 153.8 lb

## 2023-02-24 DIAGNOSIS — R12 Heartburn: Secondary | ICD-10-CM | POA: Diagnosis not present

## 2023-02-24 DIAGNOSIS — Z23 Encounter for immunization: Secondary | ICD-10-CM | POA: Diagnosis not present

## 2023-02-24 DIAGNOSIS — R140 Abdominal distension (gaseous): Secondary | ICD-10-CM | POA: Diagnosis not present

## 2023-02-24 MED ORDER — FAMOTIDINE 40 MG PO TABS
40.0000 mg | ORAL_TABLET | Freq: Every day | ORAL | 3 refills | Status: DC
Start: 2023-02-24 — End: 2023-04-17

## 2023-02-24 NOTE — Progress Notes (Signed)
Subjective:    Patient ID: Misty Edwards, female    DOB: May 29, 1926, 89 y.o.   MRN: [id number]  06/23/22 Patient is here today with her grandson.  He denies any further hypoglycemic episodes.  Cardiology discontinued amiodarone and we discontinued amlodipine.  Blood pressure remains well-controlled.  Blood pressures remain between 110 and 150 systolic.  Patient is maintaining her fluid status on 40 mg of Lasix in the morning and 20 mg of Lasix in the afternoon.  Lucila Maine is weighing her on a daily basis and giving her an extra Lasix only if the weight goes up 3 or more pounds.  The patient denies any chest pain or shortness of breath orthopnea.  Overall she states that she feels well.  AT that time, my plan was: Patient appears euvolemic today.  I will check her renal function.  Patient seems to be doing very well on 40 mg of Lasix in the morning and 20 mg of Lasix in the afternoon.  Creatinine and potassium are stable this will become her new baseline dose.  Blood sugars seem well-controlled with no evidence of hypoglycemia.  No changes in her current medication  02/24/23 Patient is a 89 y.o. female with past medical history of CAD (s/p CABG in 08/2014 with LIMA-LAD, SVG-OM and SVG-PDA), chronic HFpEF, persistent atrial fibrillation, who presents today with bloating in the abdomen for more than a week.  She has been out of her Pepcid for over a week.  She states that soon as she eats, she reports feeling full and bloated and having increased gas.  If she takes Mylanta or Maalox the symptoms improve.  She denies any melena or hematochezia.  She denies any nausea or vomiting.  She denies any abdominal pain.  She is having a bowel movement every day.  She denies any fevers or chills.  She denies any pain with the eating.  She denies any early satiety. Past Medical History:  Diagnosis Date   Acute biliary pancreatitis 07/2002   thia was in 07/2004:she still has pseudocyst in tail of pancreas measuring 54  x 33 mm    Adrenal adenoma    bilateral   Anxiety    CAD (coronary artery disease)    a. s/p CABG in 08/2014 with LIMA-LAD, SVG-OM and SVG-PDA   Chronic pancreatitis (HCC)    Detached retina    Diabetes mellitus (HCC)    Diverticulosis    Heartburn    History of carcinoma in situ of breast 1988   Hyperlipidemia    Hypertension    IBS (irritable bowel syndrome)    Legally blind    Osteoarthritis    Osteopenia    Upper GI bleed September2004   secondary to gastritis   Past Surgical History:  Procedure Laterality Date   CARDIAC CATHETERIZATION N/A 08/24/2014   Procedure: Left Heart Cath and Coronary Angiography;  Surgeon: Marykay Lex, MD;  Location: [facility] INVASIVE CV LAB;  Service: Cardiovascular;  Laterality: N/A;   COLONOSCOPY  08/15/05   few tiny diverticula at sigmoid colon/external hemorrhoids but no polyps   COLONOSCOPY  01/08/2012   LOV:FIEPPIR diverticulosis. Next colonoscopy in 12/2016   CORONARY ARTERY BYPASS GRAFT N/A 08/24/2014   Procedure: CORONARY ARTERY BYPASS GRAFTING (CABG) x three, using left internal mammary artery and right leg greater saphenous vein harvested endoscopically;  Surgeon: Kerin Perna, MD;  Location: [facility] OR;  Service: Open Heart Surgery;  Laterality: N/A;   ECTOPIC PREGNANCY SURGERY  1950's   ERCP with  sphincterotomy  07/2002   ESOPHAGOGASTRODUODENOSCOPY      Gastritis of body.  Otherwise normal   ESOPHAGOGASTRODUODENOSCOPY  01/08/2012   RMR: Few scattered gastric erosions of uncertain significance-status post biopsy. Minimal chronic inflammation, no H.pylori   LAPAROSCOPIC CHOLECYSTECTOMY     MASTECTOMY Right 1980   PANCREATIC PSEUDOCYST DRAINAGE  [id number]   drained percutaneously    right mastectomy     tacking up of her bladder     TEE WITHOUT CARDIOVERSION N/A 08/24/2014   Procedure: TRANSESOPHAGEAL ECHOCARDIOGRAM (TEE);  Surgeon: Kerin Perna, MD;  Location: [facility] OR;  Service: Open Heart Surgery;  Laterality: N/A;   Current Outpatient  Medications on File Prior to Visit  Medication Sig Dispense Refill   ondansetron (ZOFRAN) 4 MG tablet Take 1 tablet (4 mg total) by mouth every 8 (eight) hours as needed for nausea or vomiting. 20 tablet 0   apixaban (ELIQUIS) 5 MG TABS tablet Take 1 tablet (5 mg total) by mouth 2 (two) times daily. 180 tablet 1   Blood Glucose Monitoring Suppl (BLOOD GLUCOSE SYSTEM PAK) KIT Please dispense as One Touch Ultra. Use as directed to monitor FSBS 4x daily. Dx: E11.65 1 kit 1   calcium carbonate (TUMS - DOSED IN MG ELEMENTAL CALCIUM) 500 MG chewable tablet Chew 3 tablets by mouth as needed for heartburn or indigestion.     cetirizine (ZYRTEC) 10 MG tablet Take 1 tablet (10 mg total) by mouth daily. 90 tablet 1   Continuous Blood Gluc Receiver (FREESTYLE LIBRE 3 READER) DEVI 1 Device by Does not apply route daily. Use to continuously check blood sugar daily. 1 each 1   Continuous Glucose Sensor (FREESTYLE LIBRE 3 SENSOR) MISC Place 1 sensor on the skin every 14 days. Use to check glucose continuously 2 each 5   CREON [id number] units CPEP Take 1 capsule (24,000 Units total) by mouth See admin instructions. TAKE 2 CAPSULES BY MOUTH WITH MEALS AND 1 CAP WITH SNACKS x 2 720 capsule 1   famotidine (PEPCID) 40 MG tablet Take 1 tablet (40 mg total) by mouth daily. 90 tablet 0   ferrous sulfate 325 (65 FE) MG tablet Take 325 mg by mouth daily with breakfast.     FLUoxetine (PROZAC) 20 MG capsule Take 1 capsule (20 mg total) by mouth daily. 90 capsule 1   furosemide (LASIX) 20 MG tablet Take 1 tablet (20 mg total) by mouth See admin instructions. Take 40 mg in the morning and 20 mg at 4 pm.     gabapentin (NEURONTIN) 100 MG capsule Take 1 capsule (100 mg total) by mouth 3 (three) times daily. 90 capsule 2   glucose blood (ONETOUCH ULTRA TEST) test strip USE 1 STRIP TO CHECK GLUCOSE FIVE TIMES DAILY 300 each 2   insulin NPH-regular Human (NOVOLIN 70/30 RELION) (70-30) 100 UNIT/ML injection Inject 20-24 Units into  the skin See admin instructions. INJECT 24 UNITS SUBCUTANEOUSLY IN THE MORNING AND 20 IN THE EVENING ( Sliding Scale) Checks 2 hours after pt eats.     Insulin Syringe-Needle U-100 (BD INSULIN SYRINGE U/F) 31G X 5/16" 0.5 ML MISC Use as directed to administer insulin twice daily. 200 each 3   Insulin Syringes, Disposable, U-100 1 ML MISC Use as directed to administer insulin daily. 100 each 3   Lancets Misc. MISC Pt has Accu Chek Aviva Plus - Needs just lancets  Checks BS 4-5 times per day. Disp 3 boxes(90 day Supply)/4 refills Dx code. E11.9 450 each 3  LORazepam (ATIVAN) 1 MG tablet TAKE 1/2 TO 1 (ONE-HALF TO ONE) TABLET BY MOUTH TWICE DAILY AS NEEDED 30 tablet 2   magnesium oxide (MAG-OX) 400 (240 Mg) MG tablet Take 1 tablet (400 mg total) by mouth daily. 30 tablet 2   metoprolol tartrate (LOPRESSOR) 50 MG tablet Take 1 tablet (50 mg total) by mouth 2 (two) times daily. 180 tablet 3   Multiple Vitamin (MULTIVITAMIN) capsule Take 1 capsule by mouth every morning.      OneTouch Delica Lancets 33G MISC CHECK BLOOD SUGAR 4-5 TIMES PER DAY 300 each 0   pantoprazole (PROTONIX) 40 MG tablet Take 1 tablet (40 mg total) by mouth daily. 90 tablet 3   potassium chloride (KLOR-CON) 10 MEQ tablet Take 1 tablet (10 mEq total) by mouth daily. 90 tablet 3   Propylene Glycol-Glycerin (SOOTHE OP) Apply 2 drops to eye 2 (two) times daily.     rosuvastatin (CRESTOR) 5 MG tablet Take 1 tablet (5 mg total) by mouth every evening. 90 tablet 1   triamcinolone cream (KENALOG) 0.1 % Apply topically 2 (two) times daily. 60 g 0   No current facility-administered medications on file prior to visit.   Allergies  Allergen Reactions   Calcium-Containing Compounds Nausea Only   Morphine And Codeine Other (See Comments)    Hallucination   Raloxifene Itching    Evista- Face and eyes burning   Vitamin D Analogs Nausea Only   Social History   Socioeconomic History   Marital status: Widowed    Spouse name: Not on file    Number of children: 5   Years of education: Not on file   Highest education level: Not on file  Occupational History   Occupation: homemaker  Tobacco Use   Smoking status: Never   Smokeless tobacco: Never  Vaping Use   Vaping status: Never Used  Substance and Sexual Activity   Alcohol use: No    Alcohol/week: 0.0 standard drinks of alcohol   Drug use: No   Sexual activity: Not Currently    Birth control/protection: Post-menopausal  Other Topics Concern   Not on file  Social History Narrative   Lives in [place].   Social Determinants of Health   Financial Resource Strain: Not on file  Food Insecurity: No Food Insecurity (04/15/2022)   Hunger Vital Sign    Worried About Running Out of Food in the Last Year: Never true    Ran Out of Food in the Last Year: Never true  Transportation Needs: No Transportation Needs (04/15/2022)   PRAPARE - Administrator, Civil Service (Medical): No    Lack of Transportation (Non-Medical): No  Physical Activity: Not on file  Stress: Not on file  Social Connections: Not on file  Intimate Partner Violence: Not At Risk (04/15/2022)   Humiliation, Afraid, Rape, and Kick questionnaire    Fear of Current or Ex-Partner: No    Emotionally Abused: No    Physically Abused: No    Sexually Abused: No          Review of Systems  All other systems reviewed and are negative.      Objective:   Physical Exam Constitutional:      General: She is not in acute distress.    Appearance: Normal appearance. She is normal weight. She is not ill-appearing or toxic-appearing.  Cardiovascular:     Rate and Rhythm: Normal rate. Rhythm irregular.     Heart sounds: Normal heart sounds. No murmur heard.  No friction rub. No gallop.  Pulmonary:     Effort: Pulmonary effort is normal. No respiratory distress.     Breath sounds: No stridor. No wheezing, rhonchi or rales.  Chest:     Chest wall: No tenderness.  Abdominal:     General:  Abdomen is flat. Bowel sounds are normal. There is no distension.     Palpations: Abdomen is soft. There is no mass.     Tenderness: There is no abdominal tenderness.  Musculoskeletal:     Right lower leg: No edema.     Left lower leg: No edema.  Skin:    Findings: No erythema or rash.  Neurological:     General: No focal deficit present.     Mental Status: She is alert and oriented to person, place, and time. Mental status is at baseline.     Cranial Nerves: No cranial nerve deficit.     Sensory: No sensory deficit.     Motor: Weakness present.     Coordination: Coordination normal.        Assessment & Plan:   Flu vaccine need - Plan: Flu Vaccine Trivalent High Dose (Fluad)  Heartburn - Plan: famotidine (PEPCID) 40 MG tablet  Bloating This could be increased gas production due to the fact she stopped her Pepcid.  The other problem of gastroparesis her longstanding diabetes.  I recommended resuming the Pepcid and seeing if his symptoms improve.  She is already taking pantoprazole.  Reassess in 1 week.  She denies any red flag symptoms.  She received her flu shot today.  Recommended reducing her insulin.  She is currently on 44 units in the morning and 8 units at night.  She is having hypoglycemic episodes in the early morning hours.  [place] recommended reducing her evening insulin from 8 units to 4.  Her heart rate today is also significantly elevated.  Recommended increasing metoprolol to 75 mg twice daily

## 2023-03-08 ENCOUNTER — Encounter: Payer: Self-pay | Admitting: Family Medicine

## 2023-03-09 ENCOUNTER — Ambulatory Visit: Payer: PPO | Admitting: Family Medicine

## 2023-03-17 ENCOUNTER — Other Ambulatory Visit: Payer: Self-pay

## 2023-03-17 DIAGNOSIS — K862 Cyst of pancreas: Secondary | ICD-10-CM

## 2023-03-17 DIAGNOSIS — K861 Other chronic pancreatitis: Secondary | ICD-10-CM

## 2023-03-17 MED ORDER — CREON 24000-76000 UNITS PO CPEP: 24000.0000 [IU] | ORAL_CAPSULE | ORAL | 0 refills | Status: DC

## 2023-04-02 ENCOUNTER — Other Ambulatory Visit: Payer: Self-pay | Admitting: Family Medicine

## 2023-04-09 ENCOUNTER — Emergency Department (HOSPITAL_COMMUNITY): Payer: PPO

## 2023-04-09 ENCOUNTER — Other Ambulatory Visit: Payer: Self-pay

## 2023-04-09 ENCOUNTER — Encounter (HOSPITAL_COMMUNITY): Payer: Self-pay

## 2023-04-09 ENCOUNTER — Observation Stay (HOSPITAL_COMMUNITY)
Admission: EM | Admit: 2023-04-09 | Discharge: 2023-04-10 | Disposition: A | Discharge status: Home Health | Payer: PPO | Attending: Student | Admitting: Student

## 2023-04-09 ENCOUNTER — Ambulatory Visit
Admission: RE | Admit: 2023-04-09 | Discharge: 2023-04-09 | Disposition: A | Discharge status: Home / Self Care | Payer: PPO | Source: Ambulatory Visit | Attending: Family Medicine | Admitting: Family Medicine

## 2023-04-09 DIAGNOSIS — N179 Acute kidney failure, unspecified: Secondary | ICD-10-CM | POA: Insufficient documentation

## 2023-04-09 DIAGNOSIS — Z7901 Long term (current) use of anticoagulants: Secondary | ICD-10-CM | POA: Diagnosis not present

## 2023-04-09 DIAGNOSIS — S0003XA Contusion of scalp, initial encounter: Secondary | ICD-10-CM | POA: Insufficient documentation

## 2023-04-09 DIAGNOSIS — R739 Hyperglycemia, unspecified: Principal | ICD-10-CM

## 2023-04-09 DIAGNOSIS — N1832 Chronic kidney disease, stage 3b: Secondary | ICD-10-CM | POA: Insufficient documentation

## 2023-04-09 DIAGNOSIS — I130 Hypertensive heart and chronic kidney disease with heart failure and stage 1 through stage 4 chronic kidney disease, or unspecified chronic kidney disease: Secondary | ICD-10-CM | POA: Insufficient documentation

## 2023-04-09 DIAGNOSIS — R531 Weakness: Secondary | ICD-10-CM | POA: Diagnosis present

## 2023-04-09 DIAGNOSIS — E875 Hyperkalemia: Secondary | ICD-10-CM | POA: Diagnosis not present

## 2023-04-09 DIAGNOSIS — E785 Hyperlipidemia, unspecified: Secondary | ICD-10-CM | POA: Insufficient documentation

## 2023-04-09 DIAGNOSIS — E1165 Type 2 diabetes mellitus with hyperglycemia: Principal | ICD-10-CM | POA: Diagnosis present

## 2023-04-09 DIAGNOSIS — R9431 Abnormal electrocardiogram [ECG] [EKG]: Secondary | ICD-10-CM | POA: Insufficient documentation

## 2023-04-09 DIAGNOSIS — I4819 Other persistent atrial fibrillation: Secondary | ICD-10-CM | POA: Diagnosis present

## 2023-04-09 DIAGNOSIS — I2510 Atherosclerotic heart disease of native coronary artery without angina pectoris: Secondary | ICD-10-CM | POA: Diagnosis not present

## 2023-04-09 DIAGNOSIS — I5032 Chronic diastolic (congestive) heart failure: Secondary | ICD-10-CM | POA: Diagnosis not present

## 2023-04-09 DIAGNOSIS — I4891 Unspecified atrial fibrillation: Secondary | ICD-10-CM

## 2023-04-09 DIAGNOSIS — I4581 Long QT syndrome: Secondary | ICD-10-CM | POA: Diagnosis not present

## 2023-04-09 DIAGNOSIS — D696 Thrombocytopenia, unspecified: Secondary | ICD-10-CM | POA: Diagnosis present

## 2023-04-09 DIAGNOSIS — E860 Dehydration: Secondary | ICD-10-CM | POA: Diagnosis not present

## 2023-04-09 DIAGNOSIS — K219 Gastro-esophageal reflux disease without esophagitis: Secondary | ICD-10-CM | POA: Diagnosis present

## 2023-04-09 DIAGNOSIS — N184 Chronic kidney disease, stage 4 (severe): Secondary | ICD-10-CM

## 2023-04-09 DIAGNOSIS — N189 Chronic kidney disease, unspecified: Secondary | ICD-10-CM | POA: Diagnosis present

## 2023-04-09 DIAGNOSIS — Z79899 Other long term (current) drug therapy: Secondary | ICD-10-CM | POA: Diagnosis not present

## 2023-04-09 DIAGNOSIS — E782 Mixed hyperlipidemia: Secondary | ICD-10-CM | POA: Insufficient documentation

## 2023-04-09 DIAGNOSIS — R2681 Unsteadiness on feet: Secondary | ICD-10-CM | POA: Insufficient documentation

## 2023-04-09 DIAGNOSIS — I10 Essential (primary) hypertension: Secondary | ICD-10-CM | POA: Diagnosis present

## 2023-04-09 DIAGNOSIS — W19XXXA Unspecified fall, initial encounter: Secondary | ICD-10-CM

## 2023-04-09 DIAGNOSIS — Y92009 Unspecified place in unspecified non-institutional (private) residence as the place of occurrence of the external cause: Secondary | ICD-10-CM

## 2023-04-09 DIAGNOSIS — Z794 Long term (current) use of insulin: Secondary | ICD-10-CM | POA: Diagnosis not present

## 2023-04-09 HISTORY — DX: Unspecified atrial fibrillation: I48.91

## 2023-04-09 LAB — BASIC METABOLIC PANEL
Anion gap: 17 — ABNORMAL HIGH (ref 5–15)
BUN: 80 mg/dL — ABNORMAL HIGH (ref 8–23)
CO2: 29 mmol/L (ref 22–32)
Calcium: 10 mg/dL (ref 8.9–10.3)
Chloride: 88 mmol/L — ABNORMAL LOW (ref 98–111)
Creatinine, Ser: 2.18 mg/dL — ABNORMAL HIGH (ref 0.44–1.00)
GFR, Estimated: 20 mL/min — ABNORMAL LOW (ref >60)
Glucose, Bld: 759 mg/dL (ref 70–99)
Potassium: 5.5 mmol/L — ABNORMAL HIGH (ref 3.5–5.1)
Sodium: 134 mmol/L — ABNORMAL LOW (ref 135–145)

## 2023-04-09 LAB — URINALYSIS, ROUTINE W REFLEX MICROSCOPIC
Bilirubin Urine: NEGATIVE
Glucose, UA: >=500 mg/dL — AB
Ketones, ur: NEGATIVE mg/dL
Nitrite: POSITIVE — AB
Protein, ur: NEGATIVE mg/dL
Specific Gravity, Urine: 1.018 (ref 1.005–1.030)
pH: 6 (ref 5.0–8.0)

## 2023-04-09 LAB — CBC WITH DIFFERENTIAL/PLATELET
Abs Immature Granulocytes: 0.02 10*3/uL (ref 0.00–0.07)
Basophils Absolute: 0 10*3/uL (ref 0.0–0.1)
Basophils Relative: 0 %
Eosinophils Absolute: 0 10*3/uL (ref 0.0–0.5)
Eosinophils Relative: 0 %
HCT: 44 % (ref 36.0–46.0)
Hemoglobin: 14.5 g/dL (ref 12.0–15.0)
Immature Granulocytes: 0 %
Lymphocytes Relative: 15 %
Lymphs Abs: 1.1 10*3/uL (ref 0.7–4.0)
MCH: 29.1 pg (ref 26.0–34.0)
MCHC: 33 g/dL (ref 30.0–36.0)
MCV: 88.4 fL (ref 80.0–100.0)
Monocytes Absolute: 0.4 10*3/uL (ref 0.1–1.0)
Monocytes Relative: 5 %
Neutro Abs: 6.2 10*3/uL (ref 1.7–7.7)
Neutrophils Relative %: 80 %
Platelets: 144 10*3/uL — ABNORMAL LOW (ref 150–400)
RBC: 4.98 MIL/uL (ref 3.87–5.11)
RDW: 13.7 % (ref 11.5–15.5)
WBC: 7.8 10*3/uL (ref 4.0–10.5)
nRBC: 0 % (ref 0.0–0.2)

## 2023-04-09 LAB — BETA-HYDROXYBUTYRIC ACID: Beta-Hydroxybutyric Acid: 1.04 mmol/L — ABNORMAL HIGH (ref 0.05–0.27)

## 2023-04-09 LAB — GLUCOSE, CAPILLARY
Glucose-Capillary: 436 mg/dL — ABNORMAL HIGH (ref 70–99)
Glucose-Capillary: 444 mg/dL — ABNORMAL HIGH (ref 70–99)
Glucose-Capillary: 457 mg/dL — ABNORMAL HIGH (ref 70–99)
Glucose-Capillary: 499 mg/dL — ABNORMAL HIGH (ref 70–99)
Glucose-Capillary: 558 mg/dL (ref 70–99)
Glucose-Capillary: 594 mg/dL (ref 70–99)
Glucose-Capillary: >600 mg/dL (ref 70–99)

## 2023-04-09 LAB — PROTIME-INR
INR: 1.8 — ABNORMAL HIGH (ref 0.8–1.2)
Prothrombin Time: 20.6 s — ABNORMAL HIGH (ref 11.4–15.2)

## 2023-04-09 LAB — BLOOD GAS, VENOUS
Acid-Base Excess: 6.7 mmol/L — ABNORMAL HIGH (ref 0.0–2.0)
Bicarbonate: 34.5 mmol/L — ABNORMAL HIGH (ref 20.0–28.0)
Drawn by: 53361
O2 Saturation: 65.9 %
Patient temperature: 36.4
pCO2, Ven: 59 mm[Hg] (ref 44–60)
pH, Ven: 7.37 (ref 7.25–7.43)
pO2, Ven: 32 mm[Hg] (ref 32–45)

## 2023-04-09 LAB — CBG MONITORING, ED: Glucose-Capillary: >600 mg/dL (ref 70–99)

## 2023-04-09 LAB — MRSA NEXT GEN BY PCR, NASAL: MRSA by PCR Next Gen: NOT DETECTED

## 2023-04-09 MED ORDER — ORAL CARE MOUTH RINSE: 15.0000 mL | OROMUCOSAL | Status: DC | PRN

## 2023-04-09 MED ORDER — LACTATED RINGERS IV SOLN: INTRAVENOUS | Status: DC

## 2023-04-09 MED ORDER — SODIUM CHLORIDE 0.9 % IV BOLUS
1000.0000 mL | Freq: Once | INTRAVENOUS | Status: AC
  Administered 2023-04-09: 1000 mL via INTRAVENOUS

## 2023-04-09 MED ORDER — INSULIN REGULAR(HUMAN) IN NACL 100-0.9 UT/100ML-% IV SOLN
INTRAVENOUS | Status: DC
  Administered 2023-04-09: 8.5 [IU]/h via INTRAVENOUS

## 2023-04-09 MED ORDER — CEFAZOLIN SODIUM-DEXTROSE 1-4 GM/50ML-% IV SOLN: 2.0000 g | Freq: Once | INTRAVENOUS | Status: DC

## 2023-04-09 MED ORDER — ACETAMINOPHEN 650 MG RE SUPP: 650.0000 mg | Freq: Four times a day (QID) | RECTAL | Status: DC | PRN

## 2023-04-09 MED ORDER — DILTIAZEM HCL 25 MG/5ML IV SOLN
10.0000 mg | Freq: Once | INTRAVENOUS | Status: AC
  Administered 2023-04-09: 10 mg via INTRAVENOUS
  Filled 2023-04-09: qty 5

## 2023-04-09 MED ORDER — DEXTROSE 50 % IV SOLN: 0.0000 mL | INTRAVENOUS | Status: DC | PRN

## 2023-04-09 MED ORDER — METOPROLOL TARTRATE 50 MG PO TABS
50.0000 mg | ORAL_TABLET | Freq: Two times a day (BID) | ORAL | Status: DC
Start: 2023-04-09 — End: 2023-04-10
  Administered 2023-04-09 – 2023-04-10 (×2): 50 mg via ORAL
  Filled 2023-04-09 (×2): qty 1

## 2023-04-09 MED ORDER — ONDANSETRON HCL 4 MG PO TABS: 4.0000 mg | ORAL_TABLET | Freq: Four times a day (QID) | ORAL | Status: DC | PRN

## 2023-04-09 MED ORDER — ONDANSETRON HCL 4 MG/2ML IJ SOLN: 4.0000 mg | Freq: Four times a day (QID) | INTRAMUSCULAR | Status: DC | PRN

## 2023-04-09 MED ORDER — ROSUVASTATIN CALCIUM 5 MG PO TABS
5.0000 mg | ORAL_TABLET | Freq: Every evening | ORAL | Status: DC
  Administered 2023-04-09: 5 mg via ORAL
  Filled 2023-04-09: qty 1

## 2023-04-09 MED ORDER — CHLORHEXIDINE GLUCONATE CLOTH 2 % EX PADS
6.0000 | MEDICATED_PAD | Freq: Every day | CUTANEOUS | Status: DC
Start: 2023-04-09 — End: 2023-04-10
  Administered 2023-04-09 – 2023-04-10 (×2): 6 via TOPICAL

## 2023-04-09 MED ORDER — DEXTROSE IN LACTATED RINGERS 5 % IV SOLN: INTRAVENOUS | Status: DC

## 2023-04-09 MED ORDER — ACETAMINOPHEN 325 MG PO TABS: 650.0000 mg | ORAL_TABLET | Freq: Four times a day (QID) | ORAL | Status: DC | PRN

## 2023-04-09 MED ORDER — PANTOPRAZOLE SODIUM 40 MG PO TBEC
40.0000 mg | DELAYED_RELEASE_TABLET | Freq: Every day | ORAL | Status: DC
  Administered 2023-04-10: 40 mg via ORAL
  Filled 2023-04-09: qty 1

## 2023-04-09 MED ORDER — INSULIN REGULAR(HUMAN) IN NACL 100-0.9 UT/100ML-% IV SOLN
INTRAVENOUS | Status: DC
  Administered 2023-04-09: 8.5 [IU]/h via INTRAVENOUS
  Filled 2023-04-09: qty 100

## 2023-04-09 NOTE — ED Notes (Signed)
Marcelino Duster, APP had called patient's son and explained that due to the patient having a fall and blood thinners that we would be happy to evaluate the patient (she had a scheduled appointment with [place]), but she felt strongly patient would need to go to the ED.  Son wanted to  be seen, so he decided to bring mom in to be seen here first.  Once we talked in the room and he realized she will need further care no matter what (large hematoma on her forehead, fall this morning, blood thinners) he decided he did not want to have her seen here, and would proceed to the ED after his visit with [place].

## 2023-04-09 NOTE — ED Provider Notes (Signed)
[place] EMERGENCY DEPARTMENT AT [facility] Provider Note   CSN: [id number] Arrival date & time: 04/09/23  1543     History  Chief Complaint  Patient presents with   Misty Edwards is a 89 y.o. female.  89 year old female with past medical history of atrial fibrillation on Eliquis and hypertension as well as CHF presenting to the emergency department today after a fall at home.  The patient fell at home when she felt generally weak and lightheaded.  She reports that her legs gave way.  She fell and did hit her head.  She did not lose consciousness.  She is on Eliquis for atrial fibrillation.  She reports that she has been taking extra Lasix over the past few days after seeing her doctor.  She is brought to the ER for further evaluation regarding this she denies any hip pain or pain in her back.  She was brought to the ER at that time for further evaluation.   Fall       Home Medications Prior to Admission medications   Medication Sig Start Date End Date Taking? Authorizing Provider  acetaminophen (TYLENOL) 325 MG tablet Take 650 mg by mouth every 6 (six) hours as needed for mild pain (pain score 1-3).   Yes [provider]  alum & mag hydroxide-simeth (MAALOX/MYLANTA) 200-200-20 MG/5ML suspension Take 30 mLs by mouth every 6 (six) hours as needed for indigestion or heartburn.   Yes [provider]  apixaban (ELIQUIS) 5 MG TABS tablet Take 1 tablet (5 mg total) by mouth 2 (two) times daily. 11/11/22  Yes Donita Brooks, MD  calcium carbonate (TUMS - DOSED IN MG ELEMENTAL CALCIUM) 500 MG chewable tablet Chew 3 tablets by mouth as needed for heartburn or indigestion.   Yes [provider]  cetirizine (ZYRTEC) 10 MG tablet Take 1 tablet (10 mg total) by mouth daily. 11/11/22  Yes Donita Brooks, MD  CREON 24000-76000 units CPEP Take 1 capsule (24,000 Units total) by mouth See admin instructions. TAKE 2 CAPSULES BY MOUTH WITH MEALS AND  1 CAP WITH SNACKS x 2 03/17/23  Yes Donita Brooks, MD  famotidine (PEPCID) 40 MG tablet Take 1 tablet (40 mg total) by mouth daily. 02/24/23  Yes Donita Brooks, MD  ferrous sulfate 325 (65 FE) MG tablet Take 325 mg by mouth daily with breakfast.   Yes [provider]  FLUoxetine (PROZAC) 20 MG capsule Take 1 capsule (20 mg total) by mouth daily. 11/11/22  Yes Donita Brooks, MD  furosemide (LASIX) 20 MG tablet Take 1 tablet (20 mg total) by mouth See admin instructions. Take 40 mg in the morning and 20 mg at 4 pm. 11/11/22  Yes Donita Brooks, MD  gabapentin (NEURONTIN) 100 MG capsule Take 1 capsule (100 mg total) by mouth 3 (three) times daily. Patient taking differently: Take 100 mg by mouth daily. 11/11/22  Yes Donita Brooks, MD  insulin NPH-regular Human (NOVOLIN 70/30 RELION) (70-30) 100 UNIT/ML injection Inject 20-24 Units into the skin See admin instructions. INJECT 24 UNITS SUBCUTANEOUSLY IN THE MORNING AND 20 IN THE EVENING ( Sliding Scale) Checks 2 hours after pt eats. Patient taking differently: Inject 8-44 Units into the skin See admin instructions. INJECT 44 UNITS SUBCUTANEOUSLY IN THE MORNING AND 8 IN THE EVENING ( Sliding Scale) Checks 2 hours after pt eats. 04/18/22  Yes Johnson, Clanford L, MD  LORazepam (ATIVAN) 1 MG tablet TAKE 1/2  TO 1 (ONE-HALF TO ONE) TABLET BY MOUTH TWICE DAILY AS NEEDED Patient taking differently: Take 1 mg by mouth at bedtime. 04/03/23  Yes Donita Brooks, MD  magnesium oxide (MAG-OX) 400 (240 Mg) MG tablet Take 1 tablet by mouth once daily 02/25/23  Yes Branch, Dorothe Pea, MD  metoprolol tartrate (LOPRESSOR) 50 MG tablet Take 1 tablet (50 mg total) by mouth 2 (two) times daily. Patient taking differently: Take 75 mg by mouth 2 (two) times daily. 11/11/22  Yes Donita Brooks, MD  Multiple Vitamin (MULTIVITAMIN) capsule Take 1 capsule by mouth every morning.    Yes [provider]  ondansetron (ZOFRAN) 4 MG tablet Take 1  tablet (4 mg total) by mouth every 8 (eight) hours as needed for nausea or vomiting. 12/24/22  Yes Donita Brooks, MD  pantoprazole (PROTONIX) 40 MG tablet Take 1 tablet (40 mg total) by mouth daily. 11/11/22  Yes Donita Brooks, MD  potassium chloride (KLOR-CON) 10 MEQ tablet Take 1 tablet (10 mEq total) by mouth daily. 03/31/22  Yes BranchDorothe Pea, MD  Propylene Glycol-Glycerin (SOOTHE OP) Apply 2 drops to eye 2 (two) times daily.   Yes [provider]  rosuvastatin (CRESTOR) 5 MG tablet Take 1 tablet (5 mg total) by mouth every evening. 11/11/22  Yes Donita Brooks, MD  triamcinolone cream (KENALOG) 0.1 % Apply topically 2 (two) times daily. Patient taking differently: Apply 1 Application topically 3 (three) times daily. 01/10/22  Yes Donita Brooks, MD  Blood Glucose Monitoring Suppl (BLOOD GLUCOSE SYSTEM PAK) KIT Please dispense as One Touch Ultra. Use as directed to monitor FSBS 4x daily. Dx: E11.65 11/16/20   Donita Brooks, MD  Continuous Blood Gluc Receiver (FREESTYLE LIBRE 3 READER) DEVI 1 Device by Does not apply route daily. Use to continuously check blood sugar daily. 05/16/22   Donita Brooks, MD  Continuous Glucose Sensor (FREESTYLE LIBRE 3 SENSOR) MISC Place 1 sensor on the skin every 14 days. Use to check glucose continuously 10/22/22   Donita Brooks, MD  glucose blood (ONETOUCH ULTRA TEST) test strip USE 1 STRIP TO CHECK GLUCOSE FIVE TIMES DAILY 12/01/22   Donita Brooks, MD  Insulin Syringe-Needle U-100 (BD INSULIN SYRINGE U/F) 31G X 5/16" 0.5 ML MISC Use as directed to administer insulin twice daily. 12/24/22   Donita Brooks, MD  Insulin Syringes, Disposable, U-100 1 ML MISC Use as directed to administer insulin daily. 11/18/22   Donita Brooks, MD  Lancets Misc. MISC Pt has Accu Chek Aviva Plus - Needs just lancets  Checks BS 4-5 times per day. Disp 3 boxes(90 day Supply)/4 refills Dx code. E11.9 10/13/19   Donita Brooks, MD  OneTouch Delica  Lancets 33G MISC CHECK BLOOD SUGAR 4-5 TIMES PER DAY 10/22/20   Donita Brooks, MD      Allergies    Calcium-containing compounds, Morphine and codeine, Raloxifene, and Vitamin d analogs    Review of Systems   Review of Systems  Neurological:  Positive for light-headedness.  All other systems reviewed and are negative.   Physical Exam Updated Vital Signs BP 129/63   Pulse (!) 57   Temp 97.9 F (36.6 C) (Axillary)   Resp 15   Ht 5\' 3"  (1.6 m)   Wt 65.3 kg   SpO2 96%   BMI 25.51 kg/m  Physical Exam Vitals and nursing note reviewed.   Gen: NAD Eyes: PERRL, EOMI HEENT: no oropharyngeal swelling, there is a  large hematoma over the patient's forehead Neck: trachea midline Resp: clear to auscultation bilaterally Card: Tachycardic, irregular, no murmurs, rubs, or gallops Abd: nontender, nondistended Extremities: no calf tenderness, no edema Vascular: 2+ radial pulses bilaterally, 2+ DP pulses bilaterally Skin: no rashes Psyc: acting appropriately   ED Results / Procedures / Treatments   Labs (all labs ordered are listed, but only abnormal results are displayed) Labs Reviewed  CBC WITH DIFFERENTIAL/PLATELET - Abnormal; Notable for the following components:      Result Value   Platelets 144 (*)    All other components within normal limits  PROTIME-INR - Abnormal; Notable for the following components:   Prothrombin Time 20.6 (*)    INR 1.8 (*)    All other components within normal limits  BASIC METABOLIC PANEL - Abnormal; Notable for the following components:   Sodium 134 (*)    Potassium 5.5 (*)    Chloride 88 (*)    Glucose, Bld 759 (*)    BUN 80 (*)    Creatinine, Ser 2.18 (*)    GFR, Estimated 20 (*)    Anion gap 17 (*)    All other components within normal limits  URINALYSIS, ROUTINE W REFLEX MICROSCOPIC - Abnormal; Notable for the following components:   APPearance HAZY (*)    Glucose, UA >=500 (*)    Hgb urine dipstick SMALL (*)    Nitrite POSITIVE (*)     Leukocytes,Ua SMALL (*)    Bacteria, UA FEW (*)    All other components within normal limits  GLUCOSE, CAPILLARY - Abnormal; Notable for the following components:   Glucose-Capillary >600 (*)    All other components within normal limits  BETA-HYDROXYBUTYRIC ACID - Abnormal; Notable for the following components:   Beta-Hydroxybutyric Acid 1.04 (*)    All other components within normal limits  BLOOD GAS, VENOUS - Abnormal; Notable for the following components:   Bicarbonate 34.5 (*)    Acid-Base Excess 6.7 (*)    All other components within normal limits  GLUCOSE, CAPILLARY - Abnormal; Notable for the following components:   Glucose-Capillary 558 (*)    All other components within normal limits  GLUCOSE, CAPILLARY - Abnormal; Notable for the following components:   Glucose-Capillary 594 (*)    All other components within normal limits  GLUCOSE, CAPILLARY - Abnormal; Notable for the following components:   Glucose-Capillary 499 (*)    All other components within normal limits  GLUCOSE, CAPILLARY - Abnormal; Notable for the following components:   Glucose-Capillary 457 (*)    All other components within normal limits  GLUCOSE, CAPILLARY - Abnormal; Notable for the following components:   Glucose-Capillary 444 (*)    All other components within normal limits  CBG MONITORING, ED - Abnormal; Notable for the following components:   Glucose-Capillary >600 (*)    All other components within normal limits  MRSA NEXT GEN BY PCR, NASAL  BASIC METABOLIC PANEL  OSMOLALITY  HEMOGLOBIN A1C  CBC  MAGNESIUM  PHOSPHORUS    EKG EKG Interpretation Date/Time:  Thursday April 09 2023 16:52:55 EST Ventricular Rate:  120 PR Interval:    QRS Duration:  91 QT Interval:  360 QTC Calculation: 509 R Axis:   36  Text Interpretation: Atrial fibrillation LVH with secondary repolarization abnormality Prolonged QT interval Confirmed by Beckey Downing [phone]) on 04/09/2023 5:13:56 PM  Radiology CT  Head Wo Contrast Result Date: 04/09/2023 CLINICAL DATA:  Head trauma, minor. Fall. Large hematoma to the anterior scalp with bruising to  the head, eyes, and nose. EXAM: CT HEAD WITHOUT CONTRAST CT MAXILLOFACIAL WITHOUT CONTRAST CT CERVICAL SPINE WITHOUT CONTRAST TECHNIQUE: Multidetector CT imaging of the head, cervical spine, and maxillofacial structures were performed using the standard protocol without intravenous contrast. Multiplanar CT image reconstructions of the cervical spine and maxillofacial structures were also generated. RADIATION DOSE REDUCTION: This exam was performed according to the departmental dose-optimization program which includes automated exposure control, adjustment of the mA and/or kV according to patient size and/or use of iterative reconstruction technique. COMPARISON:  CT head 09/09/2018.  CT cervical spine 09/03/2018 FINDINGS: CT HEAD FINDINGS Brain: Diffuse cerebral atrophy. Ventricular dilatation consistent with central atrophy. Low-attenuation changes in the deep white matter consistent with small vessel ischemia. No abnormal extra-axial fluid collections. No mass effect or midline shift. Gray-white matter junctions are distinct. Basal cisterns are not effaced. No acute intracranial hemorrhage. Vascular: No hyperdense vessel or unexpected calcification. Skull: Normal. Negative for fracture or focal lesion. Other: Large subcutaneous scalp hematoma over the anterior frontal region. CT MAXILLOFACIAL FINDINGS Osseous: No fracture or mandibular dislocation. No destructive process. Orbits: Postoperative changes in the globes. Globes and extraocular muscles appear otherwise intact and symmetrical. Sinuses: Paranasal sinuses and mastoid air cells are clear. Soft tissues: Soft tissue hematoma over the anterior nasal region. CT CERVICAL SPINE FINDINGS Alignment: Normal. Skull base and vertebrae: Skull base appears intact. Coalition of the C4 and C5 vertebra, likely congenital. No vertebral  compression deformities. No focal bone lesion or bone destruction. Soft tissues and spinal canal: No prevertebral soft tissue swelling. No abnormal paraspinal soft tissue mass or infiltration. Vascular calcifications. Disc levels: Degenerative changes throughout the cervical spine with narrowed interspaces and endplate osteophyte formation. Degenerative changes throughout the facet joints. Posterior soft tissue calcifications likely representing dystrophic or ligamentous calcification. Upper chest: Left thyroid gland nodule measuring 2.1 cm diameter. No change since prior study. Lung apices are clear. Other: None. IMPRESSION: 1. No acute intracranial abnormalities. Chronic atrophy and small vessel ischemic changes. 2. Large subcutaneous scalp hematoma over the anterior frontal region extending over the nasal bones. 3. No acute displaced orbital, nasal, or facial bone fractures. 4. Normal alignment of the cervical spine. No acute displaced fractures. Diffuse degenerative changes. 5. 2.1 cm left thyroid gland nodule unchanged since prior CT from 2020. In the setting of significant comorbidities or limited life expectancy, no follow-up recommended (ref: J Am Coll Radiol. 2015 Feb;12(2): 143-50). Electronically Signed   By: Burman Nieves M.D.   On: 04/09/2023 19:02   CT Cervical Spine Wo Contrast Result Date: 04/09/2023 CLINICAL DATA:  Head trauma, minor. Fall. Large hematoma to the anterior scalp with bruising to the head, eyes, and nose. EXAM: CT HEAD WITHOUT CONTRAST CT MAXILLOFACIAL WITHOUT CONTRAST CT CERVICAL SPINE WITHOUT CONTRAST TECHNIQUE: Multidetector CT imaging of the head, cervical spine, and maxillofacial structures were performed using the standard protocol without intravenous contrast. Multiplanar CT image reconstructions of the cervical spine and maxillofacial structures were also generated. RADIATION DOSE REDUCTION: This exam was performed according to the departmental dose-optimization program  which includes automated exposure control, adjustment of the mA and/or kV according to patient size and/or use of iterative reconstruction technique. COMPARISON:  CT head 09/09/2018.  CT cervical spine 09/03/2018 FINDINGS: CT HEAD FINDINGS Brain: Diffuse cerebral atrophy. Ventricular dilatation consistent with central atrophy. Low-attenuation changes in the deep white matter consistent with small vessel ischemia. No abnormal extra-axial fluid collections. No mass effect or midline shift. Gray-white matter junctions are distinct. Basal cisterns are not effaced. No  acute intracranial hemorrhage. Vascular: No hyperdense vessel or unexpected calcification. Skull: Normal. Negative for fracture or focal lesion. Other: Large subcutaneous scalp hematoma over the anterior frontal region. CT MAXILLOFACIAL FINDINGS Osseous: No fracture or mandibular dislocation. No destructive process. Orbits: Postoperative changes in the globes. Globes and extraocular muscles appear otherwise intact and symmetrical. Sinuses: Paranasal sinuses and mastoid air cells are clear. Soft tissues: Soft tissue hematoma over the anterior nasal region. CT CERVICAL SPINE FINDINGS Alignment: Normal. Skull base and vertebrae: Skull base appears intact. Coalition of the C4 and C5 vertebra, likely congenital. No vertebral compression deformities. No focal bone lesion or bone destruction. Soft tissues and spinal canal: No prevertebral soft tissue swelling. No abnormal paraspinal soft tissue mass or infiltration. Vascular calcifications. Disc levels: Degenerative changes throughout the cervical spine with narrowed interspaces and endplate osteophyte formation. Degenerative changes throughout the facet joints. Posterior soft tissue calcifications likely representing dystrophic or ligamentous calcification. Upper chest: Left thyroid gland nodule measuring 2.1 cm diameter. No change since prior study. Lung apices are clear. Other: None. IMPRESSION: 1. No acute  intracranial abnormalities. Chronic atrophy and small vessel ischemic changes. 2. Large subcutaneous scalp hematoma over the anterior frontal region extending over the nasal bones. 3. No acute displaced orbital, nasal, or facial bone fractures. 4. Normal alignment of the cervical spine. No acute displaced fractures. Diffuse degenerative changes. 5. 2.1 cm left thyroid gland nodule unchanged since prior CT from 2020. In the setting of significant comorbidities or limited life expectancy, no follow-up recommended (ref: J Am Coll Radiol. 2015 Feb;12(2): 143-50). Electronically Signed   By: Burman Nieves M.D.   On: 04/09/2023 19:02   CT Maxillofacial Wo Contrast Result Date: 04/09/2023 CLINICAL DATA:  Head trauma, minor. Fall. Large hematoma to the anterior scalp with bruising to the head, eyes, and nose. EXAM: CT HEAD WITHOUT CONTRAST CT MAXILLOFACIAL WITHOUT CONTRAST CT CERVICAL SPINE WITHOUT CONTRAST TECHNIQUE: Multidetector CT imaging of the head, cervical spine, and maxillofacial structures were performed using the standard protocol without intravenous contrast. Multiplanar CT image reconstructions of the cervical spine and maxillofacial structures were also generated. RADIATION DOSE REDUCTION: This exam was performed according to the departmental dose-optimization program which includes automated exposure control, adjustment of the mA and/or kV according to patient size and/or use of iterative reconstruction technique. COMPARISON:  CT head 09/09/2018.  CT cervical spine 09/03/2018 FINDINGS: CT HEAD FINDINGS Brain: Diffuse cerebral atrophy. Ventricular dilatation consistent with central atrophy. Low-attenuation changes in the deep white matter consistent with small vessel ischemia. No abnormal extra-axial fluid collections. No mass effect or midline shift. Gray-white matter junctions are distinct. Basal cisterns are not effaced. No acute intracranial hemorrhage. Vascular: No hyperdense vessel or unexpected  calcification. Skull: Normal. Negative for fracture or focal lesion. Other: Large subcutaneous scalp hematoma over the anterior frontal region. CT MAXILLOFACIAL FINDINGS Osseous: No fracture or mandibular dislocation. No destructive process. Orbits: Postoperative changes in the globes. Globes and extraocular muscles appear otherwise intact and symmetrical. Sinuses: Paranasal sinuses and mastoid air cells are clear. Soft tissues: Soft tissue hematoma over the anterior nasal region. CT CERVICAL SPINE FINDINGS Alignment: Normal. Skull base and vertebrae: Skull base appears intact. Coalition of the C4 and C5 vertebra, likely congenital. No vertebral compression deformities. No focal bone lesion or bone destruction. Soft tissues and spinal canal: No prevertebral soft tissue swelling. No abnormal paraspinal soft tissue mass or infiltration. Vascular calcifications. Disc levels: Degenerative changes throughout the cervical spine with narrowed interspaces and endplate osteophyte formation. Degenerative changes throughout the  facet joints. Posterior soft tissue calcifications likely representing dystrophic or ligamentous calcification. Upper chest: Left thyroid gland nodule measuring 2.1 cm diameter. No change since prior study. Lung apices are clear. Other: None. IMPRESSION: 1. No acute intracranial abnormalities. Chronic atrophy and small vessel ischemic changes. 2. Large subcutaneous scalp hematoma over the anterior frontal region extending over the nasal bones. 3. No acute displaced orbital, nasal, or facial bone fractures. 4. Normal alignment of the cervical spine. No acute displaced fractures. Diffuse degenerative changes. 5. 2.1 cm left thyroid gland nodule unchanged since prior CT from 2020. In the setting of significant comorbidities or limited life expectancy, no follow-up recommended (ref: J Am Coll Radiol. 2015 Feb;12(2): 143-50). Electronically Signed   By: Burman Nieves M.D.   On: 04/09/2023 19:02   DG  Chest Portable 1 View Result Date: 04/09/2023 CLINICAL DATA:  Shortness of breath EXAM: PORTABLE CHEST 1 VIEW COMPARISON:  04/17/2022 FINDINGS: Post sternotomy changes. No acute airspace disease or effusion. Normal cardiomediastinal silhouette with aortic atherosclerosis. Clips in the right axilla IMPRESSION: No active disease. Electronically Signed   By: Jasmine Pang M.D.   On: 04/09/2023 17:51    Procedures Procedures    Medications Ordered in ED Medications  Chlorhexidine Gluconate Cloth 2 % PADS 6 each (6 each Topical Given 04/09/23 2103)  insulin regular, human (MYXREDLIN) 100 units/ 100 mL infusion (8 Units/hr Intravenous Infusion Verify 04/09/23 2326)  lactated ringers infusion ( Intravenous New Bag/Given 04/09/23 2117)  dextrose 5 % in lactated ringers infusion (has no administration in time range)  dextrose 50 % solution 0-50 mL (has no administration in time range)  acetaminophen (TYLENOL) tablet 650 mg (has no administration in time range)    Or  acetaminophen (TYLENOL) suppository 650 mg (has no administration in time range)  ondansetron (ZOFRAN) tablet 4 mg (has no administration in time range)    Or  ondansetron (ZOFRAN) injection 4 mg (has no administration in time range)  metoprolol tartrate (LOPRESSOR) tablet 50 mg (50 mg Oral Given 04/09/23 2150)  rosuvastatin (CRESTOR) tablet 5 mg (5 mg Oral Given 04/09/23 2150)  pantoprazole (PROTONIX) EC tablet 40 mg (has no administration in time range)  Oral care mouth rinse (has no administration in time range)  diltiazem (CARDIZEM) injection 10 mg (10 mg Intravenous Given 04/09/23 1747)  sodium chloride 0.9 % bolus 1,000 mL (1,000 mLs Intravenous New Bag/Given 04/09/23 1919)    ED Course/ Medical Decision Making/ A&P Clinical Course as of 04/09/23 2343  Thu Apr 09, 2023  1850 759 CBG [CG]    Clinical Course User Index [CG] Al Decant, PA-C                                 Medical Decision Making 89 year old female  with past medical history of atrial fibrillation on Eliquis and hypertension presenting to the emergency department today after a fall at home.  Patient was lightheaded.  She does appear to be in atrial fibrillation with RVR based on her EKG on my interpretation.  I will give the patient Cardizem for this.  Will obtain basic labs here to evaluate for anemia or electrolyte abnormalities.  Will obtain a CT scan of her head and cervical spine as well as her maxillofacial bones to evaluate for acute traumatic injuries.  Also obtain chest x-ray here.  I will reevaluate for ultimate disposition.  The patient's labs revealing for significant hyperglycemia.  The patient  started [place] for this.  Her CT scans were unremarkable.  Her heart rate did improve after the diltiazem bolus.  She is admitted to the hospital service for further evaluation management.  CRITICAL CARE Performed by: Durwin Glaze   Total critical care time: 42 minutes  Critical care time was exclusive of separately billable procedures and treating other patients.  Critical care was necessary to treat or prevent imminent or life-threatening deterioration.  Critical care was time spent personally by me on the following activities: development of treatment plan with patient and/or surrogate as well as nursing, discussions with consultants, evaluation of patient's response to treatment, examination of patient, obtaining history from patient or surrogate, ordering and performing treatments and interventions, ordering and review of laboratory studies, ordering and review of radiographic studies, pulse oximetry and re-evaluation of patient's condition.   Amount and/or Complexity of Data Reviewed Labs: ordered. Radiology: ordered.  Risk Prescription drug management. Decision regarding hospitalization.           Final Clinical Impression(s) / ED Diagnoses Final diagnoses:  Hyperglycemia  Atrial fibrillation with RVR (HCC)     Rx / DC Orders ED Discharge Orders     None         Durwin Glaze, MD 04/09/23 [phone]

## 2023-04-09 NOTE — ED Triage Notes (Signed)
Pt arrived via POV from Urgent Care where it was adviseds Pt seek a higher level of care in the ED. Pt presents with large hematoma to anterior scalp, bruising to head, eyes, and nose. Pt reportedly woke up to use restroom early this morning and her legs "gave out" on her causing her to fall and hit her head on the wall. Pt denies LOC. Pt does take a blood thinner.

## 2023-04-09 NOTE — H&P (Signed)
History and Physical    Patient: Misty Edwards:[id number] DOB: Nov 25, 1926 DOA: 04/09/2023 DOS: the patient was seen and examined on 04/09/2023 PCP: Donita Brooks, MD  Patient coming from: Home  Chief Complaint:  Chief Complaint  Patient presents with   Fall   HPI: Misty Edwards is a 89 y.o. female with medical history significant of type 2 diabetes mellitus, chronic HFpEF, CKD 3B, hypertension, CAD, hyperlipidemia who presents to the emergency department from an urgent care where it was advised for patient to go to the ED for further evaluation and management.  Patient states that on waking up this morning to use the restroom, her legs became weak and sustained a fall hitting her head on the wall without losing consciousness.  She sustained a large hematoma to anterior scalp with bruise to head eyes and nose.  She was on a blood thinner due to history of atrial fibrillation.  Patient denies any back pain or hip pain.  ED Course:  In the emergency department, HR was 110 bpm, BP 140/104, other vital signs were within normal range.  Workup in the ED shows normal CBC except for platelets of 144.  BMP showed sodium 134, potassium 4.5, chloride 88, bicarb 29, glucose 759, BUN 80, creatinine 2.18 (baseline creatinine at 1.4), GFR 20, anion gap 17. CT head without contrast showed no acute intracranial abnormalities.  Large subcutaneous scalp hematoma right anterior frontal region extending over the nasal bones. CT maxillofacial without contrast showed no acute displaced orbital, nasal or facial bone fractures. CT cervical spine without contrast showed normal alignment of the cervical spine.  No acute displaced fractures.  Diffuse degenerative changes. IV Cardizem 10 mg IV x 1 was given due to A-fib with RVR.  IV hydration was provided and patient was started on Endo tool  Review of Systems: Review of systems as noted in the HPI. All other systems reviewed and are negative.   Past Medical  History:  Diagnosis Date   Acute biliary pancreatitis 07/2002   thia was in 07/2004:she still has pseudocyst in tail of pancreas measuring 54 x 33 mm    Adrenal adenoma    bilateral   Anxiety    Atrial fibrillation (HCC)    CAD (coronary artery disease)    a. s/p CABG in 08/2014 with LIMA-LAD, SVG-OM and SVG-PDA   Chronic pancreatitis (HCC)    Detached retina    Diabetes mellitus (HCC)    Diverticulosis    Heartburn    History of carcinoma in situ of breast 1988   Hyperlipidemia    Hypertension    IBS (irritable bowel syndrome)    Legally blind    Osteoarthritis    Osteopenia    Upper GI bleed September2004   secondary to gastritis   Past Surgical History:  Procedure Laterality Date   CARDIAC CATHETERIZATION N/A 08/24/2014   Procedure: Left Heart Cath and Coronary Angiography;  Surgeon: Marykay Lex, MD;  Location: [facility] INVASIVE CV LAB;  Service: Cardiovascular;  Laterality: N/A;   COLONOSCOPY  08/15/05   few tiny diverticula at sigmoid colon/external hemorrhoids but no polyps   COLONOSCOPY  01/08/2012   [id number] diverticulosis. Next colonoscopy in 12/2016   CORONARY ARTERY BYPASS GRAFT N/A 08/24/2014   Procedure: CORONARY ARTERY BYPASS GRAFTING (CABG) x three, using left internal mammary artery and right leg greater saphenous vein harvested endoscopically;  Surgeon: Kerin Perna, MD;  Location: [facility] OR;  Service: Open Heart Surgery;  Laterality: N/A;   ECTOPIC  PREGNANCY SURGERY  1950's   ERCP with sphincterotomy  07/2002   ESOPHAGOGASTRODUODENOSCOPY      Gastritis of body.  Otherwise normal   ESOPHAGOGASTRODUODENOSCOPY  01/08/2012   RMR: Few scattered gastric erosions of uncertain significance-status post biopsy. Minimal chronic inflammation, no H.pylori   LAPAROSCOPIC CHOLECYSTECTOMY     MASTECTOMY Right 1980   PANCREATIC PSEUDOCYST DRAINAGE  [id number]   drained percutaneously    right mastectomy     tacking up of her bladder     TEE WITHOUT CARDIOVERSION N/A 08/24/2014    Procedure: TRANSESOPHAGEAL ECHOCARDIOGRAM (TEE);  Surgeon: Kerin Perna, MD;  Location: [facility] OR;  Service: Open Heart Surgery;  Laterality: N/A;    Social History:  reports that she has never smoked. She has never used smokeless tobacco. She reports that she does not drink alcohol and does not use drugs.   Allergies  Allergen Reactions   Calcium-Containing Compounds Nausea Only   Morphine And Codeine Other (See Comments)    Hallucination   Raloxifene Itching    Evista- Face and eyes burning   Vitamin D Analogs Nausea Only    Family History  Problem Relation Age of Onset   Ovarian cancer Mother        passed   Colon cancer Father 68       passed away in his 80's   Colon cancer Brother 20       surgery inhis 50's doing well now   Heart attack Brother        passed away age 93   Pancreatitis Brother    Kidney disease Daughter    Leukemia Son      Prior to Admission medications   Medication Sig Start Date End Date Taking? Authorizing Provider  acetaminophen (TYLENOL) 325 MG tablet Take 650 mg by mouth every 6 (six) hours as needed for mild pain (pain score 1-3).   Yes [provider]  alum & mag hydroxide-simeth (MAALOX/MYLANTA) 200-200-20 MG/5ML suspension Take 30 mLs by mouth every 6 (six) hours as needed for indigestion or heartburn.   Yes [provider]  apixaban (ELIQUIS) 5 MG TABS tablet Take 1 tablet (5 mg total) by mouth 2 (two) times daily. 11/11/22  Yes Donita Brooks, MD  cetirizine (ZYRTEC) 10 MG tablet Take 1 tablet (10 mg total) by mouth daily. 11/11/22  Yes Donita Brooks, MD  CREON 24000-76000 units CPEP Take 1 capsule (24,000 Units total) by mouth See admin instructions. TAKE 2 CAPSULES BY MOUTH WITH MEALS AND 1 CAP WITH SNACKS x 2 03/17/23  Yes Donita Brooks, MD  famotidine (PEPCID) 40 MG tablet Take 1 tablet (40 mg total) by mouth daily. 02/24/23  Yes Donita Brooks, MD  ferrous sulfate 325 (65 FE) MG tablet Take 325 mg by mouth  daily with breakfast.   Yes [provider]  FLUoxetine (PROZAC) 20 MG capsule Take 1 capsule (20 mg total) by mouth daily. 11/11/22  Yes Donita Brooks, MD  furosemide (LASIX) 20 MG tablet Take 1 tablet (20 mg total) by mouth See admin instructions. Take 40 mg in the morning and 20 mg at 4 pm. 11/11/22  Yes Donita Brooks, MD  gabapentin (NEURONTIN) 100 MG capsule Take 1 capsule (100 mg total) by mouth 3 (three) times daily. Patient taking differently: Take 100 mg by mouth daily. 11/11/22  Yes Donita Brooks, MD  magnesium oxide (MAG-OX) 400 (240 Mg) MG tablet Take 1 tablet by mouth once daily 02/25/23  Yes Branch, Dorothe Pea, MD  metoprolol tartrate (LOPRESSOR) 50 MG tablet Take 1 tablet (50 mg total) by mouth 2 (two) times daily. Patient taking differently: Take 75 mg by mouth 2 (two) times daily. 11/11/22  Yes Donita Brooks, MD  Multiple Vitamin (MULTIVITAMIN) capsule Take 1 capsule by mouth every morning.    Yes [provider]  ondansetron (ZOFRAN) 4 MG tablet Take 1 tablet (4 mg total) by mouth every 8 (eight) hours as needed for nausea or vomiting. 12/24/22  Yes Donita Brooks, MD  pantoprazole (PROTONIX) 40 MG tablet Take 1 tablet (40 mg total) by mouth daily. 11/11/22  Yes Donita Brooks, MD  potassium chloride (KLOR-CON) 10 MEQ tablet Take 1 tablet (10 mEq total) by mouth daily. 03/31/22  Yes BranchDorothe Pea, MD  Propylene Glycol-Glycerin (SOOTHE OP) Apply 2 drops to eye 2 (two) times daily.   Yes [provider]  rosuvastatin (CRESTOR) 5 MG tablet Take 1 tablet (5 mg total) by mouth every evening. 11/11/22  Yes Donita Brooks, MD  triamcinolone cream (KENALOG) 0.1 % Apply topically 2 (two) times daily. Patient taking differently: Apply 1 Application topically 3 (three) times daily. 01/10/22  Yes Donita Brooks, MD  Blood Glucose Monitoring Suppl (BLOOD GLUCOSE SYSTEM PAK) KIT Please dispense as One Touch Ultra. Use as directed to monitor FSBS  4x daily. Dx: E11.65 11/16/20   Donita Brooks, MD  calcium carbonate (TUMS - DOSED IN MG ELEMENTAL CALCIUM) 500 MG chewable tablet Chew 3 tablets by mouth as needed for heartburn or indigestion.    [provider]  Continuous Blood Gluc Receiver (FREESTYLE LIBRE 3 READER) DEVI 1 Device by Does not apply route daily. Use to continuously check blood sugar daily. 05/16/22   Donita Brooks, MD  Continuous Glucose Sensor (FREESTYLE LIBRE 3 SENSOR) MISC Place 1 sensor on the skin every 14 days. Use to check glucose continuously 10/22/22   Donita Brooks, MD  glucose blood (ONETOUCH ULTRA TEST) test strip USE 1 STRIP TO CHECK GLUCOSE FIVE TIMES DAILY 12/01/22   Donita Brooks, MD  insulin NPH-regular Human (NOVOLIN 70/30 RELION) (70-30) 100 UNIT/ML injection Inject 20-24 Units into the skin See admin instructions. INJECT 24 UNITS SUBCUTANEOUSLY IN THE MORNING AND 20 IN THE EVENING ( Sliding Scale) Checks 2 hours after pt eats. Patient taking differently: Inject 20-24 Units into the skin See admin instructions. INJECT 44 UNITS SUBCUTANEOUSLY IN THE MORNING AND 4 IN THE EVENING ( Sliding Scale) Checks 2 hours after pt eats. 04/18/22   Johnson, Clanford L, MD  Insulin Syringe-Needle U-100 (BD INSULIN SYRINGE U/F) 31G X 5/16" 0.5 ML MISC Use as directed to administer insulin twice daily. 12/24/22   Donita Brooks, MD  Insulin Syringes, Disposable, U-100 1 ML MISC Use as directed to administer insulin daily. 11/18/22   Donita Brooks, MD  Lancets Misc. MISC Pt has Accu Chek Aviva Plus - Needs just lancets  Checks BS 4-5 times per day. Disp 3 boxes(90 day Supply)/4 refills Dx code. E11.9 10/13/19   Donita Brooks, MD  LORazepam (ATIVAN) 1 MG tablet TAKE 1/2 TO 1 (ONE-HALF TO ONE) TABLET BY MOUTH TWICE DAILY AS NEEDED Patient taking differently: Take 1 mg by mouth at bedtime. 04/03/23   Donita Brooks, MD  OneTouch Delica Lancets 33G MISC CHECK BLOOD SUGAR 4-5 TIMES PER DAY 10/22/20   Donita Brooks, MD    Physical Exam: BP (!) 201/94   Pulse  92   Temp (!) 97.5 F (36.4 C) (Oral)   Resp 20   Ht 5\' 3"  (1.6 m)   Wt 65.3 kg   SpO2 97%   BMI 25.51 kg/m   General: 89 y.o. year-old female well developed well nourished in no acute distress.  Alert and oriented x3. HEENT: Hematoma in the frontal area, EOMI, dry mucous membrane Neck: Supple, trachea medial Cardiovascular: Irregular rate and rhythm with no rubs or gallops.  No JVD noted.  No lower extremity edema. 2/4 pulses in all 4 extremities. Respiratory: Clear to auscultation with no wheezes or rales. Good inspiratory effort. Abdomen: Soft, nontender nondistended with normal bowel sounds x4 quadrants. Muskuloskeletal: No cyanosis, clubbing or edema noted bilaterally Neuro: CN II-XII intact, strength 5/5 x 4, sensation, reflexes intact Skin: Decreased skin turgor.  No ulcerative lesions noted or rashes Psychiatry: Judgement and insight appear normal. Mood is appropriate for condition and setting          Labs on Admission:  Basic Metabolic Panel: Recent Labs  Lab 04/09/23 1714  NA 134*  K 5.5*  CL 88*  CO2 29  GLUCOSE 759*  BUN 80*  CREATININE 2.18*  CALCIUM 10.0   Liver Function Tests: No results for input(s): "AST", "ALT", "ALKPHOS", "BILITOT", "PROT", "ALBUMIN" in the last 168 hours. No results for input(s): "LIPASE", "AMYLASE" in the last 168 hours. No results for input(s): "AMMONIA" in the last 168 hours. CBC: Recent Labs  Lab 04/09/23 1714  WBC 7.8  NEUTROABS 6.2  HGB 14.5  HCT 44.0  MCV 88.4  PLT 144*   Cardiac Enzymes: No results for input(s): "CKTOTAL", "CKMB", "CKMBINDEX", "TROPONINI" in the last 168 hours.  BNP (last 3 results) Recent Labs    04/15/22 1131 04/16/22 0439 04/17/22 0429  BNP 882.0* 765.0* 512.0*    ProBNP (last 3 results) No results for input(s): "PROBNP" in the last 8760 hours.  CBG: Recent Labs  Lab 04/09/23 2020 04/09/23 2101  GLUCAP >600* >600*     Radiological Exams on Admission: CT Head Wo Contrast Result Date: 04/09/2023 CLINICAL DATA:  Head trauma, minor. Fall. Large hematoma to the anterior scalp with bruising to the head, eyes, and nose. EXAM: CT HEAD WITHOUT CONTRAST CT MAXILLOFACIAL WITHOUT CONTRAST CT CERVICAL SPINE WITHOUT CONTRAST TECHNIQUE: Multidetector CT imaging of the head, cervical spine, and maxillofacial structures were performed using the standard protocol without intravenous contrast. Multiplanar CT image reconstructions of the cervical spine and maxillofacial structures were also generated. RADIATION DOSE REDUCTION: This exam was performed according to the departmental dose-optimization program which includes automated exposure control, adjustment of the mA and/or kV according to patient size and/or use of iterative reconstruction technique. COMPARISON:  CT head 09/09/2018.  CT cervical spine 09/03/2018 FINDINGS: CT HEAD FINDINGS Brain: Diffuse cerebral atrophy. Ventricular dilatation consistent with central atrophy. Low-attenuation changes in the deep white matter consistent with small vessel ischemia. No abnormal extra-axial fluid collections. No mass effect or midline shift. Gray-white matter junctions are distinct. Basal cisterns are not effaced. No acute intracranial hemorrhage. Vascular: No hyperdense vessel or unexpected calcification. Skull: Normal. Negative for fracture or focal lesion. Other: Large subcutaneous scalp hematoma over the anterior frontal region. CT MAXILLOFACIAL FINDINGS Osseous: No fracture or mandibular dislocation. No destructive process. Orbits: Postoperative changes in the globes. Globes and extraocular muscles appear otherwise intact and symmetrical. Sinuses: Paranasal sinuses and mastoid air cells are clear. Soft tissues: Soft tissue hematoma over the anterior nasal region. CT CERVICAL SPINE FINDINGS Alignment: Normal. Skull base and  vertebrae: Skull base appears intact. Coalition of the C4 and C5  vertebra, likely congenital. No vertebral compression deformities. No focal bone lesion or bone destruction. Soft tissues and spinal canal: No prevertebral soft tissue swelling. No abnormal paraspinal soft tissue mass or infiltration. Vascular calcifications. Disc levels: Degenerative changes throughout the cervical spine with narrowed interspaces and endplate osteophyte formation. Degenerative changes throughout the facet joints. Posterior soft tissue calcifications likely representing dystrophic or ligamentous calcification. Upper chest: Left thyroid gland nodule measuring 2.1 cm diameter. No change since prior study. Lung apices are clear. Other: None. IMPRESSION: 1. No acute intracranial abnormalities. Chronic atrophy and small vessel ischemic changes. 2. Large subcutaneous scalp hematoma over the anterior frontal region extending over the nasal bones. 3. No acute displaced orbital, nasal, or facial bone fractures. 4. Normal alignment of the cervical spine. No acute displaced fractures. Diffuse degenerative changes. 5. 2.1 cm left thyroid gland nodule unchanged since prior CT from 2020. In the setting of significant comorbidities or limited life expectancy, no follow-up recommended (ref: J Am Coll Radiol. 2015 Feb;12(2): 143-50). Electronically Signed   By: Burman Nieves M.D.   On: 04/09/2023 19:02   CT Cervical Spine Wo Contrast Result Date: 04/09/2023 CLINICAL DATA:  Head trauma, minor. Fall. Large hematoma to the anterior scalp with bruising to the head, eyes, and nose. EXAM: CT HEAD WITHOUT CONTRAST CT MAXILLOFACIAL WITHOUT CONTRAST CT CERVICAL SPINE WITHOUT CONTRAST TECHNIQUE: Multidetector CT imaging of the head, cervical spine, and maxillofacial structures were performed using the standard protocol without intravenous contrast. Multiplanar CT image reconstructions of the cervical spine and maxillofacial structures were also generated. RADIATION DOSE REDUCTION: This exam was performed according to  the departmental dose-optimization program which includes automated exposure control, adjustment of the mA and/or kV according to patient size and/or use of iterative reconstruction technique. COMPARISON:  CT head 09/09/2018.  CT cervical spine 09/03/2018 FINDINGS: CT HEAD FINDINGS Brain: Diffuse cerebral atrophy. Ventricular dilatation consistent with central atrophy. Low-attenuation changes in the deep white matter consistent with small vessel ischemia. No abnormal extra-axial fluid collections. No mass effect or midline shift. Gray-white matter junctions are distinct. Basal cisterns are not effaced. No acute intracranial hemorrhage. Vascular: No hyperdense vessel or unexpected calcification. Skull: Normal. Negative for fracture or focal lesion. Other: Large subcutaneous scalp hematoma over the anterior frontal region. CT MAXILLOFACIAL FINDINGS Osseous: No fracture or mandibular dislocation. No destructive process. Orbits: Postoperative changes in the globes. Globes and extraocular muscles appear otherwise intact and symmetrical. Sinuses: Paranasal sinuses and mastoid air cells are clear. Soft tissues: Soft tissue hematoma over the anterior nasal region. CT CERVICAL SPINE FINDINGS Alignment: Normal. Skull base and vertebrae: Skull base appears intact. Coalition of the C4 and C5 vertebra, likely congenital. No vertebral compression deformities. No focal bone lesion or bone destruction. Soft tissues and spinal canal: No prevertebral soft tissue swelling. No abnormal paraspinal soft tissue mass or infiltration. Vascular calcifications. Disc levels: Degenerative changes throughout the cervical spine with narrowed interspaces and endplate osteophyte formation. Degenerative changes throughout the facet joints. Posterior soft tissue calcifications likely representing dystrophic or ligamentous calcification. Upper chest: Left thyroid gland nodule measuring 2.1 cm diameter. No change since prior study. Lung apices are  clear. Other: None. IMPRESSION: 1. No acute intracranial abnormalities. Chronic atrophy and small vessel ischemic changes. 2. Large subcutaneous scalp hematoma over the anterior frontal region extending over the nasal bones. 3. No acute displaced orbital, nasal, or facial bone fractures. 4. Normal alignment of the cervical spine. No acute displaced  fractures. Diffuse degenerative changes. 5. 2.1 cm left thyroid gland nodule unchanged since prior CT from 2020. In the setting of significant comorbidities or limited life expectancy, no follow-up recommended (ref: J Am Coll Radiol. 2015 Feb;12(2): 143-50). Electronically Signed   By: Burman Nieves M.D.   On: 04/09/2023 19:02   CT Maxillofacial Wo Contrast Result Date: 04/09/2023 CLINICAL DATA:  Head trauma, minor. Fall. Large hematoma to the anterior scalp with bruising to the head, eyes, and nose. EXAM: CT HEAD WITHOUT CONTRAST CT MAXILLOFACIAL WITHOUT CONTRAST CT CERVICAL SPINE WITHOUT CONTRAST TECHNIQUE: Multidetector CT imaging of the head, cervical spine, and maxillofacial structures were performed using the standard protocol without intravenous contrast. Multiplanar CT image reconstructions of the cervical spine and maxillofacial structures were also generated. RADIATION DOSE REDUCTION: This exam was performed according to the departmental dose-optimization program which includes automated exposure control, adjustment of the mA and/or kV according to patient size and/or use of iterative reconstruction technique. COMPARISON:  CT head 09/09/2018.  CT cervical spine 09/03/2018 FINDINGS: CT HEAD FINDINGS Brain: Diffuse cerebral atrophy. Ventricular dilatation consistent with central atrophy. Low-attenuation changes in the deep white matter consistent with small vessel ischemia. No abnormal extra-axial fluid collections. No mass effect or midline shift. Gray-white matter junctions are distinct. Basal cisterns are not effaced. No acute intracranial hemorrhage.  Vascular: No hyperdense vessel or unexpected calcification. Skull: Normal. Negative for fracture or focal lesion. Other: Large subcutaneous scalp hematoma over the anterior frontal region. CT MAXILLOFACIAL FINDINGS Osseous: No fracture or mandibular dislocation. No destructive process. Orbits: Postoperative changes in the globes. Globes and extraocular muscles appear otherwise intact and symmetrical. Sinuses: Paranasal sinuses and mastoid air cells are clear. Soft tissues: Soft tissue hematoma over the anterior nasal region. CT CERVICAL SPINE FINDINGS Alignment: Normal. Skull base and vertebrae: Skull base appears intact. Coalition of the C4 and C5 vertebra, likely congenital. No vertebral compression deformities. No focal bone lesion or bone destruction. Soft tissues and spinal canal: No prevertebral soft tissue swelling. No abnormal paraspinal soft tissue mass or infiltration. Vascular calcifications. Disc levels: Degenerative changes throughout the cervical spine with narrowed interspaces and endplate osteophyte formation. Degenerative changes throughout the facet joints. Posterior soft tissue calcifications likely representing dystrophic or ligamentous calcification. Upper chest: Left thyroid gland nodule measuring 2.1 cm diameter. No change since prior study. Lung apices are clear. Other: None. IMPRESSION: 1. No acute intracranial abnormalities. Chronic atrophy and small vessel ischemic changes. 2. Large subcutaneous scalp hematoma over the anterior frontal region extending over the nasal bones. 3. No acute displaced orbital, nasal, or facial bone fractures. 4. Normal alignment of the cervical spine. No acute displaced fractures. Diffuse degenerative changes. 5. 2.1 cm left thyroid gland nodule unchanged since prior CT from 2020. In the setting of significant comorbidities or limited life expectancy, no follow-up recommended (ref: J Am Coll Radiol. 2015 Feb;12(2): 143-50). Electronically Signed   By: Burman Nieves M.D.   On: 04/09/2023 19:02   DG Chest Portable 1 View Result Date: 04/09/2023 CLINICAL DATA:  Shortness of breath EXAM: PORTABLE CHEST 1 VIEW COMPARISON:  04/17/2022 FINDINGS: Post sternotomy changes. No acute airspace disease or effusion. Normal cardiomediastinal silhouette with aortic atherosclerosis. Clips in the right axilla IMPRESSION: No active disease. Electronically Signed   By: Jasmine Pang M.D.   On: 04/09/2023 17:51    EKG: I independently viewed the EKG done and my findings are as followed: A-fib with RVR with prolonged QTc of 509 ms  Assessment/Plan Present on Admission:  Hyperglycemia  due to diabetes mellitus (HCC)  Acute kidney injury superimposed on chronic kidney disease (HCC)  Thrombocytopenia (HCC)  Essential hypertension  GERD  Persistent atrial fibrillation with RVR (HCC)  Principal Problem:   Hyperglycemia due to diabetes mellitus (HCC) Active Problems:   Essential hypertension   GERD   Thrombocytopenia (HCC)   Acute kidney injury superimposed on chronic kidney disease (HCC)   Persistent atrial fibrillation with RVR (HCC)   Hyperkalemia   Dehydration   Fall at home, initial encounter   Scalp hematoma   Prolonged QT interval   Mixed hyperlipidemia   Chronic heart failure with preserved ejection fraction (HFpEF) (HCC)  Hyperglycemia secondary to poorly controlled type 2 diabetes mellitus CBG was 759; Mental status was normal Continue insulin drip, IV LR  per DKA protocol Transition IV LR to D5 LR when serum glucose reaches 250mg /dL Continue serial BMP and VBG Continue to monitor for anion gap closure prior to transitioning patient to subcu insulin Continue NPO  Increased anion gap Anion gap was 17 which was possibly due to patient's dehydration Continue IV hydration and monitor for anion gap closure  Hyperkalemia K+ 5.5, IV hydration was provided Continue insulin drip per Endo tool and continue to monitor potassium  levels  Dehydration Continue IV hydration  Fall at home Patient usually ambulates with walker at baseline, she did not use a walker when she sustained a fall Continue fall precaution Continue PT/OT eval and treat  Scalp hematoma Stable, Eliquis will be temporarily held at this time  Acute kidney injury superimposed on CKD 4 Creatinine 2.18 (baseline creatinine at 1.4)  Thrombocytopenia possibly due to reactive process Platelets 144, continue to follow-up platelet levels with morning labs  Prolonged QT interval QTc 509 ms Avoid QT prolonging drugs Magnesium level will be checked Repeat EKG in the morning  Persistent atrial fibrillation with RVR IV Cardizem 10 mg x 1 was given and patient converted to A-fib with rate control Continue Lopressor Eliquis will be temporarily held due to scalp hematoma  Essential hypertension Continue amlodipine, Lopressor  Mixed hyperlipidemia Continue Crestor  Chronic HFpEF Continue total input/output, daily weights and fluid restriction Echo done on 03/02/2022 showed LVEF of 60 to 65%.  Positive RWMA.  Moderate asymmetric LVH.  LV diastolic parameters are indeterminate. Patient is followed by Dr. Wyline Mood as an outpatient  GERD Continue Protonix  DVT prophylaxis: SCDs  Code Status: Full code  Family Communication: Family at bedside (all questions answered to satisfaction)  Consults: None  Severity of Illness: The appropriate patient status for this patient is INPATIENT. Inpatient status is judged to be reasonable and necessary in order to provide the required intensity of service to ensure the patient's safety. The patient's presenting symptoms, physical exam findings, and initial radiographic and laboratory data in the context of their chronic comorbidities is felt to place them at high risk for further clinical deterioration. Furthermore, it is not anticipated that the patient will be medically stable for discharge from the hospital  within 2 midnights of admission.   * I certify that at the point of admission it is my clinical judgment that the patient will require inpatient hospital care spanning beyond 2 midnights from the point of admission due to high intensity of service, high risk for further deterioration and high frequency of surveillance required.*  Author: Frankey Shown, DO 04/09/2023 9:50 PM  For on call review [email or web address].

## 2023-04-10 ENCOUNTER — Ambulatory Visit: Payer: PPO | Admitting: Family Medicine

## 2023-04-10 DIAGNOSIS — N189 Chronic kidney disease, unspecified: Secondary | ICD-10-CM

## 2023-04-10 DIAGNOSIS — E860 Dehydration: Secondary | ICD-10-CM | POA: Diagnosis not present

## 2023-04-10 DIAGNOSIS — E1165 Type 2 diabetes mellitus with hyperglycemia: Secondary | ICD-10-CM | POA: Diagnosis not present

## 2023-04-10 DIAGNOSIS — W19XXXA Unspecified fall, initial encounter: Secondary | ICD-10-CM | POA: Diagnosis not present

## 2023-04-10 DIAGNOSIS — I4819 Other persistent atrial fibrillation: Secondary | ICD-10-CM

## 2023-04-10 DIAGNOSIS — I5032 Chronic diastolic (congestive) heart failure: Secondary | ICD-10-CM | POA: Diagnosis not present

## 2023-04-10 DIAGNOSIS — N1832 Chronic kidney disease, stage 3b: Secondary | ICD-10-CM | POA: Diagnosis not present

## 2023-04-10 DIAGNOSIS — I10 Essential (primary) hypertension: Secondary | ICD-10-CM | POA: Diagnosis not present

## 2023-04-10 DIAGNOSIS — Z7901 Long term (current) use of anticoagulants: Secondary | ICD-10-CM | POA: Diagnosis not present

## 2023-04-10 LAB — CBC
HCT: 40.1 % (ref 36.0–46.0)
Hemoglobin: 12.8 g/dL (ref 12.0–15.0)
MCH: 28.8 pg (ref 26.0–34.0)
MCHC: 31.9 g/dL (ref 30.0–36.0)
MCV: 90.1 fL (ref 80.0–100.0)
Platelets: 127 10*3/uL — ABNORMAL LOW (ref 150–400)
RBC: 4.45 MIL/uL (ref 3.87–5.11)
RDW: 13.4 % (ref 11.5–15.5)
WBC: 8.4 10*3/uL (ref 4.0–10.5)
nRBC: 0 % (ref 0.0–0.2)

## 2023-04-10 LAB — GLUCOSE, CAPILLARY
Glucose-Capillary: 117 mg/dL — ABNORMAL HIGH (ref 70–99)
Glucose-Capillary: 158 mg/dL — ABNORMAL HIGH (ref 70–99)
Glucose-Capillary: 160 mg/dL — ABNORMAL HIGH (ref 70–99)
Glucose-Capillary: 164 mg/dL — ABNORMAL HIGH (ref 70–99)
Glucose-Capillary: 179 mg/dL — ABNORMAL HIGH (ref 70–99)
Glucose-Capillary: 180 mg/dL — ABNORMAL HIGH (ref 70–99)
Glucose-Capillary: 182 mg/dL — ABNORMAL HIGH (ref 70–99)
Glucose-Capillary: 184 mg/dL — ABNORMAL HIGH (ref 70–99)
Glucose-Capillary: 195 mg/dL — ABNORMAL HIGH (ref 70–99)
Glucose-Capillary: 213 mg/dL — ABNORMAL HIGH (ref 70–99)
Glucose-Capillary: 275 mg/dL — ABNORMAL HIGH (ref 70–99)
Glucose-Capillary: 333 mg/dL — ABNORMAL HIGH (ref 70–99)

## 2023-04-10 LAB — HEMOGLOBIN A1C
Hgb A1c MFr Bld: 8.1 % — ABNORMAL HIGH (ref 4.8–5.6)
Mean Plasma Glucose: 185.77 mg/dL

## 2023-04-10 LAB — BASIC METABOLIC PANEL
Anion gap: 13 (ref 5–15)
BUN: 66 mg/dL — ABNORMAL HIGH (ref 8–23)
CO2: 30 mmol/L (ref 22–32)
Calcium: 9.6 mg/dL (ref 8.9–10.3)
Chloride: 102 mmol/L (ref 98–111)
Creatinine, Ser: 1.75 mg/dL — ABNORMAL HIGH (ref 0.44–1.00)
GFR, Estimated: 27 mL/min — ABNORMAL LOW (ref >60)
Glucose, Bld: 199 mg/dL — ABNORMAL HIGH (ref 70–99)
Potassium: 3.7 mmol/L (ref 3.5–5.1)
Sodium: 145 mmol/L (ref 135–145)

## 2023-04-10 LAB — MAGNESIUM: Magnesium: 2.7 mg/dL — ABNORMAL HIGH (ref 1.7–2.4)

## 2023-04-10 LAB — PHOSPHORUS: Phosphorus: 4 mg/dL (ref 2.5–4.6)

## 2023-04-10 LAB — OSMOLALITY: Osmolality: 355 mosm/kg (ref 275–295)

## 2023-04-10 MED ORDER — INSULIN GLARGINE-YFGN 100 UNIT/ML ~~LOC~~ SOLN
15.0000 [IU] | Freq: Every day | SUBCUTANEOUS | Status: DC
  Administered 2023-04-10: 15 [IU] via SUBCUTANEOUS
  Filled 2023-04-10 (×2): qty 0.15

## 2023-04-10 MED ORDER — INSULIN ASPART 100 UNIT/ML IJ SOLN
0.0000 [IU] | Freq: Three times a day (TID) | INTRAMUSCULAR | Status: DC
Start: 2023-04-10 — End: 2023-04-10
  Administered 2023-04-10: 2 [IU] via SUBCUTANEOUS

## 2023-04-10 MED ORDER — FLUOXETINE HCL 20 MG PO CAPS
20.0000 mg | ORAL_CAPSULE | Freq: Every day | ORAL | Status: DC
  Administered 2023-04-10: 20 mg via ORAL
  Filled 2023-04-10: qty 1

## 2023-04-10 MED ORDER — PANCRELIPASE (LIP-PROT-AMYL) 36000-114000 UNITS PO CPEP: 36000.0000 [IU] | ORAL_CAPSULE | Freq: Three times a day (TID) | ORAL | Status: DC

## 2023-04-10 NOTE — Plan of Care (Signed)
  Problem: Acute Rehab OT Goals (only OT should resolve) Goal: Pt. Will Perform Grooming Flowsheets (Taken 04/10/2023 0932) Pt Will Perform Grooming:  with modified independence  standing Goal: Pt. Will Perform Lower Body Dressing Flowsheets (Taken 04/10/2023 0932) Pt Will Perform Lower Body Dressing: with modified independence Goal: Pt. Will Transfer To Toilet Flowsheets (Taken 04/10/2023 0932) Pt Will Transfer to Toilet:  with modified independence  ambulating Goal: Pt/Caregiver Will Perform Home Exercise Program Flowsheets (Taken 04/10/2023 0932) Pt/caregiver will Perform Home Exercise Program:  Increased strength  Both right and left upper extremity  Independently  Abel Ra OT, MOT

## 2023-04-10 NOTE — Inpatient Diabetes Management (Signed)
Inpatient Diabetes Program Recommendations  AACE/ADA: New Consensus Statement on Inpatient Glycemic Control (2015)  Target Ranges:  Prepandial:   less than 140 mg/dL      Peak postprandial:   less than 180 mg/dL (1-2 hours)      Critically ill patients:  140 - 180 mg/dL   Lab Results  Component Value Date   GLUCAP 213 (H) 04/10/2023   HGBA1C 8.1 (H) 04/09/2023    Diabetes history: DM2 Outpatient Diabetes medications: 70/30 44 units am, 8 units pm Current orders for Inpatient glycemic control: Semglee 15 units daily, Novolog 0-9 units tid  Inpatient Diabetes Program Recommendations:   Spoke with Augustin Coupe (granddaughter). Via phone regarding patient's diabetes management. Granddaughter draws up patient's insulin syringes each day to have while granddaughter is at work. Yesterday, patient did not take any insulin. Granddaughter and husband live with patient and patient has a Comptroller that stays with her during the day while they work.  Thank you, Billy Fischer. Taneal Sonntag, RN, MSN, CDCES  Diabetes Coordinator Inpatient Glycemic Control Team Team Pager [phone] (8am-5pm) 04/10/2023 11:19 AM

## 2023-04-10 NOTE — Plan of Care (Signed)
  Problem: Acute Rehab PT Goals(only PT should resolve) Goal: Pt Will Go Supine/Side To Sit Outcome: Progressing Flowsheets (Taken 04/10/2023 1220) Pt will go Supine/Side to Sit: with contact guard assist Goal: Patient Will Transfer Sit To/From Stand Outcome: Progressing Flowsheets (Taken 04/10/2023 1220) Patient will transfer sit to/from stand: with contact guard assist Goal: Pt Will Transfer Bed To Chair/Chair To Bed Outcome: Progressing Flowsheets (Taken 04/10/2023 1220) Pt will Transfer Bed to Chair/Chair to Bed: with contact guard assist Goal: Pt Will Ambulate Outcome: Progressing Flowsheets (Taken 04/10/2023 1220) Pt will Ambulate:  50 feet  with contact guard assist  with rolling walker    Misty Edwards SPT

## 2023-04-10 NOTE — Evaluation (Signed)
Physical Therapy Evaluation Patient Details Name: Misty Edwards MRN: [id number] DOB: 1927-02-28 Today's Date: 04/10/2023  History of Present Illness  Misty Edwards is a 89 y.o. female with medical history significant of type 2 diabetes mellitus, chronic HFpEF, CKD 3B, hypertension, CAD, hyperlipidemia who presents to the emergency department from an urgent care where it was advised for patient to go to the ED for further evaluation and management.  Patient states that on waking up this morning to use the restroom, her legs became weak and sustained a fall hitting her head on the wall without losing consciousness.  She sustained a large hematoma to anterior scalp with bruise to head eyes and nose.  She was on a blood thinner due to history of atrial fibrillation.  Patient denies any back pain or hip pain.   Clinical Impression  Patient was agreeable to therapy. Patient performed bed mobility and transfers with CTG/min assist. With the use of RW patient ambulated in room and into the hallway. CTG/min assist was provided during ambulation. Patient required verbal/tactile cueing to keep RW close during ambulation to prevent falls. Patient was a little unsteady while ambulating. RW helped to improve patient balance when standing and during ambulation. At conclusion of session patient was left in chair with call bell. Patient will benefit from continued skilled physical therapy in hospital and recommended venue below to increase strength, balance, endurance for safe ADLs and gait.          If plan is discharge home, recommend the following: A little help with walking and/or transfers;A little help with bathing/dressing/bathroom;Help with stairs or ramp for entrance;Assistance with cooking/housework   Can travel by private vehicle        Equipment Recommendations None recommended by PT  Recommendations for Other Services       Functional Status Assessment Patient has had a recent decline in  their functional status and demonstrates the ability to make significant improvements in function in a reasonable and predictable amount of time.     Precautions / Restrictions Precautions Precautions: Fall Restrictions Weight Bearing Restrictions Per Provider Order: No      Mobility  Bed Mobility Overal bed mobility: Needs Assistance Bed Mobility: Supine to Sit     Supine to sit: Contact guard, Min assist, HOB elevated     General bed mobility comments: mild labored movement Patient Response: Cooperative  Transfers Overall transfer level: Needs assistance Equipment used: Rolling walker (2 wheels) Transfers: Sit to/from Stand, Bed to chair/wheelchair/BSC Sit to Stand: Contact guard assist, Min assist   Step pivot transfers: Contact guard assist, Min assist       General transfer comment: Pt mildly impulsive during transfers. Unsteady in standing. Improved transfers with RW.    Ambulation/Gait Ambulation/Gait assistance: Contact guard assist, Min assist Gait Distance (Feet): 35 Feet Assistive device: Rolling walker (2 wheels) Gait Pattern/deviations: Step-to pattern, Decreased step length - right, Decreased step length - left, Decreased stride length Gait velocity: slow     General Gait Details: little unsteady, use RW, cueing to keep RW close while ambulating  Stairs            Wheelchair Mobility     Tilt Bed Tilt Bed Patient Response: Cooperative  Modified Rankin (Stroke Patients Only)       Balance Overall balance assessment: Needs assistance Sitting-balance support: No upper extremity supported, Feet supported Sitting balance-Leahy Scale: Good Sitting balance - Comments: seated at EOB   Standing balance support: During functional activity, Bilateral upper  extremity supported Standing balance-Leahy Scale: Fair Standing balance comment: using RW; poor without RW                             Pertinent Vitals/Pain Pain  Assessment Pain Assessment: No/denies pain    Home Living Family/patient expects to be discharged to:: Private residence Living Arrangements: Other relatives Available Help at Discharge: Personal care attendant;Family;Available PRN/intermittently Type of Home: Mobile home Home Access: Ramped entrance       Home Layout: One level Home Equipment: Agricultural consultant (2 wheels);Rollator (4 wheels);BSC/3in1;Wheelchair - manual;Hospital bed;Shower seat;Grab bars - tub/shower      Prior Function Prior Level of Function : Needs assist;History of Falls (last six months)       Physical Assist : ADLs (physical)   ADLs (physical): IADLs Mobility Comments: Community ambulator with RW ADLs Comments: Independent ADL; assist IADL's     Extremity/Trunk Assessment   Upper Extremity Assessment Upper Extremity Assessment: Defer to OT evaluation    Lower Extremity Assessment Lower Extremity Assessment: Generalized weakness    Cervical / Trunk Assessment Cervical / Trunk Assessment: Kyphotic  Communication   Communication Communication: No apparent difficulties Cueing Techniques: Verbal cues;Tactile cues  Cognition Arousal: Alert Behavior During Therapy: WFL for tasks assessed/performed Overall Cognitive Status: Within Functional Limits for tasks assessed                                          General Comments      Exercises     Assessment/Plan    PT Assessment Patient needs continued PT services  PT Problem List Decreased strength;Decreased activity tolerance;Decreased balance;Decreased mobility;Decreased range of motion;Decreased coordination       PT Treatment Interventions DME instruction;Gait training;Stair training;Functional mobility training;Therapeutic activities;Therapeutic exercise;Balance training;Patient/family education    PT Goals (Current goals can be found in the Care Plan section)  Acute Rehab PT Goals Patient Stated Goal: To return  home PT Goal Formulation: With patient Time For Goal Achievement: 04/23/23 Potential to Achieve Goals: Good    Frequency Min 3X/week     Co-evaluation PT/OT/SLP Co-Evaluation/Treatment: Yes Reason for Co-Treatment: To address functional/ADL transfers PT goals addressed during session: Mobility/safety with mobility         AM-PAC PT "6 Clicks" Mobility  Outcome Measure Help needed turning from your back to your side while in a flat bed without using bedrails?: A Little Help needed moving from lying on your back to sitting on the side of a flat bed without using bedrails?: A Little Help needed moving to and from a bed to a chair (including a wheelchair)?: A Little Help needed standing up from a chair using your arms (e.g., wheelchair or bedside chair)?: A Little Help needed to walk in hospital room?: A Little Help needed climbing 3-5 steps with a railing? : Total 6 Click Score: 16    End of Session Equipment Utilized During Treatment: Gait belt Activity Tolerance: Patient tolerated treatment well Patient left: in chair;with call bell/phone within reach Nurse Communication: Mobility status PT Visit Diagnosis: Unsteadiness on feet (R26.81);Other abnormalities of gait and mobility (R26.89);Muscle weakness (generalized) (M62.81)    Time: 7829-5621 PT Time Calculation (min) (ACUTE ONLY): 30 min   Charges:   PT Evaluation $PT Eval Moderate Complexity: 1 Mod PT Treatments $Therapeutic Activity: 23-37 mins PT General Charges $$ ACUTE PT VISIT: 1 Visit  Kearra Calkin SPT

## 2023-04-10 NOTE — TOC Initial Note (Signed)
Transition of Care [facility]) - Initial/Assessment Note    Patient Details  Name: MIKIYAH Edwards MRN: [id number] Date of Birth: Nov 03, 1926  Transition of Care [facility]) CM/SW Contact:    Elliot Gault, LCSW Phone Number: 04/10/2023, 12:15 PM  Clinical Narrative:                  Pt here obs status from home with dc for today. HH PT, RN, OT ordered by MD. Sherron Monday with pt at bedside and pt's grandchildren by phone to review recommendations and dc planning.  Pt and family agreeable to [facility] referral. CMS provider options reviewed. Referred to [facility] at their request.  No other TOC needs identified for dc. Expected Discharge Plan: Home w Home Health Services Barriers to Discharge: Barriers Resolved   Patient Goals and CMS Choice Patient states their goals for this hospitalization and ongoing recovery are:: go home CMS Medicare.gov Compare Post Acute Care list provided to:: Patient Choice offered to / list presented to : Patient      Expected Discharge Plan and Services In-house Referral: Clinical Social Work   Post Acute Care Choice: Home Health Living arrangements for the past 2 months: Single Family Home Expected Discharge Date: 04/10/23                         HH Arranged: RN, PT, OT HH Agency: [facility] Date [facility] Agency Contacted: 04/10/23   Representative spoke with at [facility] Agency: Cindie  Prior Living Arrangements/Services Living arrangements for the past 2 months: Single Family Home Lives with:: Adult Children Patient language and need for interpreter reviewed:: Yes Do you feel safe going back to the place where you live?: Yes      Need for Family Participation in Patient Care: Yes (Comment) Care giver support system in place?: Yes (comment) Current home services: DME, Sitter Criminal Activity/Legal Involvement Pertinent to Current Situation/Hospitalization: No - Comment as needed  Activities of Daily Living   ADL Screening (condition at time of  admission) Independently performs ADLs?: Yes (appropriate for developmental age) Is the patient deaf or have difficulty hearing?: Yes Does the patient have difficulty seeing, even when wearing glasses/contacts?: No Does the patient have difficulty concentrating, remembering, or making decisions?: No  Permission Sought/Granted Permission sought to share information with : Facility Industrial/product designer granted to share information with : Yes, Verbal Permission Granted     Permission granted to share info w AGENCY: HH        Emotional Assessment Appearance:: Appears stated age Attitude/Demeanor/Rapport: Engaged Affect (typically observed): Pleasant Orientation: : Oriented to Self, Oriented to Place, Oriented to  Time, Oriented to Situation Alcohol / Substance Use: Not Applicable Psych Involvement: No (comment)  Admission diagnosis:  Hyperglycemia [R73.9] Atrial fibrillation with RVR (HCC) [I48.91] Hyperglycemia due to diabetes mellitus (HCC) [E11.65] Patient Active Problem List   Diagnosis Date Noted   Hyperglycemia due to diabetes mellitus (HCC) 04/09/2023   Hyperkalemia 04/09/2023   Dehydration 04/09/2023   Fall at home, initial encounter 04/09/2023   Scalp hematoma 04/09/2023   Prolonged QT interval 04/09/2023   Mixed hyperlipidemia 04/09/2023   Chronic heart failure with preserved ejection fraction (HFpEF) (HCC) 04/09/2023   COVID-19 virus infection 04/15/2022   Hypomagnesemia 03/13/2022   Acute on chronic congestive heart failure (HCC) 03/11/2022   Coronary artery disease involving coronary bypass graft of native heart without angina pectoris 03/04/2022   Nonrheumatic mitral valve regurgitation 03/03/2022   Acute exacerbation of CHF (  congestive heart failure) (HCC) 03/01/2022   Persistent atrial fibrillation with RVR (HCC) 03/01/2022   Exocrine pancreatic insufficiency 10/04/2021   Heartburn 10/04/2021   Acute pulmonary edema (HCC)    Acute respiratory  failure (HCC) 09/23/2019   Palliative care by specialist    Advanced care planning/counseling discussion    Goals of care, counseling/discussion    Disorientation    Acute kidney injury superimposed on chronic kidney disease (HCC) 09/13/2018   Hyponatremia 09/13/2018   Herpes zoster ophthalmicus 09/03/2018   Right acetabular fracture (HCC) 09/03/2018   Syncope and collapse 09/03/2018   Protein-calorie malnutrition, severe 09/03/2018   Vaginal atrophy 04/29/2018   Angiokeratoma 04/29/2018   PMB (postmenopausal bleeding) 04/29/2018   Acute on chronic diastolic CHF (congestive heart failure) (HCC) 10/23/2016   Acute respiratory failure with hypoxia (HCC) 10/23/2016   Nephrolithiasis 09/27/2016   Hydronephrosis with renal and ureteral calculus obstruction 09/27/2016   Hydronephrosis 09/27/2016   Heme positive stool 09/05/2016   Dyslipidemia 03/07/2015   Type 2 diabetes mellitus with vascular disease (HCC) 03/07/2015   Anemia 09/26/2014   Lesion of lip 09/25/2014   S/P CABG x 3 08/25/2014   NSTEMI (non-ST elevated myocardial infarction) (HCC) 08/24/2014   Acute coronary syndrome (HCC)    IBS (irritable bowel syndrome) 08/26/2012   Thrombocytopenia (HCC) 02/02/2012   Epigastric pain 02/02/2012   Gastritis 02/02/2012   Abdominal pain 12/17/2011   Normocytic anemia 12/17/2011   History of colon polyps 12/17/2011   Constipation 12/17/2011   Family history of colon cancer 08/22/2011   LLQ pain 08/21/2011   Chronic pancreatitis (HCC) 09/12/2008   HELICOBACTER PYLORI INFECTION 01/26/2008   ADENOCARCINOMA, BREAST 01/26/2008   Essential hypertension 01/26/2008   External hemorrhoids 01/26/2008   GERD 01/26/2008   PSEUDOCYST, PANCREAS 01/26/2008   GALLSTONE PANCREATITIS 01/26/2008   GI BLEEDING 01/26/2008   UTI (urinary tract infection) 01/26/2008   NAUSEA 01/26/2008   VOMITING 01/26/2008   DIARRHEA 01/26/2008   PCP:  Donita Brooks, MD Pharmacy:   [facility] [address], [place] - [address] [place] [address] Phone: [phone] Fax: [phone]     Social Drivers of Health (SDOH) Social History: SDOH Screenings   Food Insecurity: No Food Insecurity (04/09/2023)  Housing: Low Risk  (04/09/2023)  Transportation Needs: No Transportation Needs (04/09/2023)  Utilities: Not At Risk (04/09/2023)  Depression (PHQ2-9): Low Risk  (02/24/2023)  Social Connections: Unknown (04/09/2023)  Tobacco Use: Low Risk  (04/09/2023)   SDOH Interventions:     Readmission Risk Interventions    04/16/2022   11:39 AM 03/05/2022    1:14 PM  Readmission Risk Prevention Plan  Transportation Screening Complete Complete  PCP or Specialist Appt within 5-7 Days  Complete  Home Care Screening  Complete  Medication Review (RN CM)  Complete  Medication Review (RN Care Manager) Complete   PCP or Specialist appointment within 3-5 days of discharge Not Complete   HRI or Home Care Consult Complete   SW Recovery Care/Counseling Consult Complete   Palliative Care Screening Not Applicable   Skilled Nursing Facility Not Applicable

## 2023-04-10 NOTE — Evaluation (Signed)
Occupational Therapy Evaluation Patient Details Name: Misty Edwards MRN: [id number] DOB: 24-May-1926 Today's Date: 04/10/2023   History of Present Illness Misty Edwards is a 89 y.o. female with medical history significant of type 2 diabetes mellitus, chronic HFpEF, CKD 3B, hypertension, CAD, hyperlipidemia who presents to the emergency department from an urgent care where it was advised for patient to go to the ED for further evaluation and management.  Patient states that on waking up this morning to use the restroom, her legs became weak and sustained a fall hitting her head on the wall without losing consciousness.  She sustained a large hematoma to anterior scalp with bruise to head eyes and nose.  She was on a blood thinner due to history of atrial fibrillation.  Patient denies any back pain or hip pain. (per DO)   Clinical Impression   Pt agreeable to OT and PT co-evaluation. Pt reports ambulation with RW at baseline with assist only for IADL's. Pt required CGA to min A for transfers and bed mobility today. Mildly impulsive but likely near baseline for mobility. Pt does well with seated ADL's. Pt was left in the chair with call bell within reach. Pt will benefit from continued OT in the hospital and recommended venue below to increase strength, balance, and endurance for safe ADL's.          If plan is discharge home, recommend the following: A little help with walking and/or transfers;A little help with bathing/dressing/bathroom;Assistance with cooking/housework;Assist for transportation;Help with stairs or ramp for entrance    Functional Status Assessment  Patient has had a recent decline in their functional status and demonstrates the ability to make significant improvements in function in a reasonable and predictable amount of time.  Equipment Recommendations  None recommended by OT    Recommendations for Other Services       Precautions / Restrictions  Precautions Precautions: Fall Restrictions Weight Bearing Restrictions Per Provider Order: No      Mobility Bed Mobility Overal bed mobility: Needs Assistance Bed Mobility: Supine to Sit     Supine to sit: Contact guard, Min assist, HOB elevated     General bed mobility comments: mild labored movement    Transfers Overall transfer level: Needs assistance Equipment used: Rolling walker (2 wheels) Transfers: Sit to/from Stand, Bed to chair/wheelchair/BSC Sit to Stand: Contact guard assist, Min assist     Step pivot transfers: Contact guard assist, Min assist     General transfer comment: Pt mildly impulsive during transfers. Unsteady in standing. Improved transfers with RW.      Balance Overall balance assessment: Needs assistance Sitting-balance support: No upper extremity supported, Feet supported Sitting balance-Leahy Scale: Good Sitting balance - Comments: seated at EOB   Standing balance support: During functional activity, Bilateral upper extremity supported Standing balance-Leahy Scale: Fair Standing balance comment: using RW; poor without RW                           ADL either performed or assessed with clinical judgement   ADL Overall ADL's : Needs assistance/impaired     Grooming: Contact guard assist;Standing   Upper Body Bathing: Set up;Sitting   Lower Body Bathing: Set up;Sitting/lateral leans       Lower Body Dressing: Set up;Sitting/lateral leans   Toilet Transfer: Contact guard assist;Minimal assistance;Ambulation;Rolling walker (2 wheels) Toilet Transfer Details (indicate cue type and reason): Simulated via ambulation in hall and transfer to chair. Toileting- Architect  and Hygiene: Set up;Sitting/lateral lean       Functional mobility during ADLs: Contact guard assist;Minimal assistance;Rolling walker (2 wheels) General ADL Comments: Able to ambulate many feet in the hall with min A needed to steer the RW at times.      Vision Baseline Vision/History: 1 Wears glasses Ability to See in Adequate Light: 1 Impaired Patient Visual Report: No change from baseline Vision Assessment?: No apparent visual deficits     Perception Perception: Not tested       Praxis Praxis: Not tested       Pertinent Vitals/Pain Pain Assessment Pain Assessment: No/denies pain     Extremity/Trunk Assessment Upper Extremity Assessment Upper Extremity Assessment: Generalized weakness (R shoulder flexion 3-/5 with reports of arthritis; generally weak otherwise.)   Lower Extremity Assessment Lower Extremity Assessment: Defer to PT evaluation   Cervical / Trunk Assessment Cervical / Trunk Assessment: Kyphotic   Communication Communication Communication: No apparent difficulties   Cognition Arousal: Alert Behavior During Therapy: WFL for tasks assessed/performed Overall Cognitive Status: Within Functional Limits for tasks assessed                                                        Home Living Family/patient expects to be discharged to:: Private residence Living Arrangements: Other relatives (grandson) Available Help at Discharge: Personal care attendant;Family;Available PRN/intermittently (5x week 4 hrs a day care attendant) Type of Home: Mobile home Home Access: Ramped entrance     Home Layout: One level     Bathroom Shower/Tub: Chief Strategy Officer: Handicapped height (BSC  placed over the toilet)     Home Equipment: Agricultural consultant (2 wheels);Rollator (4 wheels);BSC/3in1;Wheelchair - manual;Hospital bed;Shower seat;Grab bars - tub/shower          Prior Functioning/Environment Prior Level of Function : Needs assist;History of Falls (last six months)       Physical Assist : ADLs (physical)   ADLs (physical): IADLs Mobility Comments: Community ambulator with RW ADLs Comments: Independent ADL; assist IADL's        OT Problem List: Decreased  strength;Decreased activity tolerance;Decreased range of motion;Impaired balance (sitting and/or standing)      OT Treatment/Interventions: Self-care/ADL training;Therapeutic exercise;Therapeutic activities;Patient/family education    OT Goals(Current goals can be found in the care plan section) Acute Rehab OT Goals Patient Stated Goal: return home OT Goal Formulation: With patient Time For Goal Achievement: 04/24/23 Potential to Achieve Goals: Good  OT Frequency: Min 1X/week    Co-evaluation PT/OT/SLP Co-Evaluation/Treatment: Yes Reason for Co-Treatment: To address functional/ADL transfers   OT goals addressed during session: ADL's and self-care      AM-PAC OT "6 Clicks" Daily Activity     Outcome Measure Help from another person eating meals?: None Help from another person taking care of personal grooming?: A Little Help from another person toileting, which includes using toliet, bedpan, or urinal?: A Little Help from another person bathing (including washing, rinsing, drying)?: A Little Help from another person to put on and taking off regular upper body clothing?: A Little Help from another person to put on and taking off regular lower body clothing?: A Little 6 Click Score: 19   End of Session Equipment Utilized During Treatment: Rolling walker (2 wheels)  Activity Tolerance: Patient tolerated treatment well Patient left: in chair;with call bell/phone  within reach  OT Visit Diagnosis: Unsteadiness on feet (R26.81);Other abnormalities of gait and mobility (R26.89);Muscle weakness (generalized) (M62.81)                Time: 1610-9604 OT Time Calculation (min): 21 min Charges:  OT General Charges $OT Visit: 1 Visit OT Evaluation $OT Eval Low Complexity: 1 Low  Zuleima Haser OT, MOT  Danie Chandler 04/10/2023, 9:31 AM

## 2023-04-10 NOTE — Plan of Care (Signed)

## 2023-04-10 NOTE — Discharge Summary (Signed)
Physician Discharge Summary  Misty Edwards ZOX:[id number] DOB: January 29, 1927 DOA: 04/09/2023  PCP: Donita Brooks, MD  Admit date: 04/09/2023 Discharge date: 04/10/2023 Admitted From: Home Disposition: Home Recommendations for Outpatient Follow-up:  Follow up with PCP in 1 week Outpatient follow-up with cardiology in 2 to 3 weeks.  Assess risk and benefit of anticoagulation due to fall risk Check glycemic control, CBC and CMP at follow-up Patient is at risk for polypharmacy on Ativan and gabapentin.  Reassess this at follow-up Please follow up on the following pending results: None  Home Health: HH PT/OT Equipment/Devices: None  Discharge Condition: Stable CODE STATUS: Full code  Follow-up Information     Donita Brooks, MD. Schedule an appointment as soon as possible for a visit in 1 week(s).   Specialty: Family Medicine Contact information: [address] [place] [address] [phone]                 Hospital course 89 year old F with PMH of IDDM-2, A-fib on Eliquis, diastolic CHF, CKD-3B, CAD, HTN and HLD presented to urgent care after she had accidental fall due to bilateral leg weakness, and sent to ED for further evaluation.  Patient woke up in the morning and went to the bathroom when her legs became weak and she sustained a fall hitting her head against the wall without losing consciousness.  She denies prodromes leading to this.  She denies chest pain, dyspnea, palpitation or dizziness.  She denies focal neurosymptoms.  She sustained some bruising and hematoma to anterior scalp/face.  She is on Eliquis for A-fib.  In ED, mild RVR to 110.  Slightly hypertensive.  CBC without significant finding.  Glucose 759.  VBG basically normal.  Bicarb 29.  AG 17.  Cr 2.2 (baseline 1.4).  BUN 80.  K5.5.  CT head without contrast without acute intracranial finding but large subcutaneous scalp hematoma on the right anterior frontal region extending over the nasal  bones.  CT maxillofacial CT showed no acute displaced orbital nasal or facial bone fracture.  CT cervical spine without acute finding.  Patient was given IV Cardizem x 1 for A-fib with mild RVR.  She was started on IV fluid hydration and Endo tool for hyperglycemic crisis, and admitted.  The next day, RVR resolved.  Hyperglycemia improved. Cr down to 1.75.  She was transitioned to subcu insulin, and CBG remained within acceptable range.  Evaluated by therapy.  She felt well and ready to go home.  She is discharged with home health.  Patient will continue home insulin 70/30 per recommendation by her PCP.  Advised to hold Lasix and Eliquis for 4 to 5 days.  Follow-up with primary care doctor next week.  Discharge plan discussed with patient's daughter-in-law over the phone.  Written discharge instruction provided as well.  See individual problem list below for more.   Problems addressed during this hospitalization Uncontrolled IDDM-2 with hyperglycemia: Not in DKA or HHS.  Likely due to dehydration or vice versa.  May be missed insulin.  A1c 8.1%.  Hyperglycemia improved. Recent Labs  Lab 04/10/23 0743 04/10/23 0843 04/10/23 0951 04/10/23 1111 04/10/23 1151  GLUCAP 158* 117* 213* 179* 184*  -Continue home 70/30  -Advised to hold diuretics for 4 to 5 days. -Advised to monitor glucose and keep blood glucose log         Fall at home/scalp hematoma: Looks accidental.  CT head, maxillofacial and CT cervical spine as above. -Advised to hold Eliquis for  4 to 5 days. -Recommend risk and benefit discussion given risk of fall -HH PT/OT.  Patient has rolling walker already.   Dehydration/AKI on CKD-3B: Improved. -Advised to hold Lasix for 4 to 5 days. -Recheck renal function in 1 week  Hyperkalemia: Likely due to AKI.  Resolved.  Thrombocytopenia possibly due to reactive process -Recheck at follow-up.   Prolonged QT: QTc 509. -Avoid QT prolonging drugs   Persistent atrial fibrillation  with RVR: RVR resolved. -Continue home metoprolol -Hold Eliquis for 4 to 5 days   Essential hypertension: Normotensive -Continue amlodipine, Lopressor   Mixed hyperlipidemia -Continue Crestor   Chronic HFpEF: Appears euvolemic.  May be dehydrated on presentation. Echo done on 03/02/2022 showed LVEF of 60 to 65%.  Positive RWMA.  Moderate asymmetric LVH.  LV diastolic parameters are indeterminate. -Hold home Lasix for 4 to 5 days -Outpatient follow-up with cardiology   GERD -Continue Protonix  At risk for polypharmacy: On Ativan and gabapentin which could increase the risk of polypharmacy given her age and poor renal function.           Time spent 35 minutes  Vital signs Vitals:   04/10/23 0900 04/10/23 1000 04/10/23 1100 04/10/23 1131  BP: (!) 118/54 (!) 110/37 104/66   Pulse:   84   Temp:    (!) 97.4 F (36.3 C)  Resp: 16 20 14    Height:      Weight:      SpO2: 96% 96% 96%   TempSrc:    Oral  BMI (Calculated):         Discharge exam  GENERAL: No apparent distress.  Nontoxic. HEENT: MMM.  Vision and hearing grossly intact.  Facial bruising as below. NECK: Supple.  No apparent JVD.  RESP:  No IWOB.  Fair aeration bilaterally. CVS: Irregular rhythm.  Normal rate.  Heart sounds normal.  ABD/GI/GU: BS+. Abd soft, NTND.  MSK/EXT:  Moves extremities. No apparent deformity. No edema.  SKIN: Bruising to anterior frontal region/anterior scalp NEURO: Awake and alert. Oriented appropriately.  PERRL.  EOMI.  No facial asymmetry.  Speech clear.  Motor, sensory and reflexes intact. PSYCH: Calm. Normal affect.   Discharge Instructions Discharge Instructions     Diet - low sodium heart healthy   Complete by: As directed    Diet Carb Modified   Complete by: As directed    Discharge instructions   Complete by: As directed    It has been a pleasure taking care of you!  You were hospitalized due to high blood glucose, fall, atrial fibrillation, acute kidney injury.  Your  kidney number has recovered with intravenous fluid.  Would recommend holding your Eliquis and furosemide for 4 to 5 days.  Please check your blood glucose and keep blood glucose log so you contact your primary care doctor at follow-up.  Use your insulin as prescribed.   Note that combination of lorazepam and gabapentin can increase your risk of confusion, sedation and fall.  We strongly recommend you discuss this with your prescribers.    Take care,   Increase activity slowly   Complete by: As directed       Allergies as of 04/10/2023       Reactions   Calcium-containing Compounds Nausea Only   Morphine And Codeine Other (See Comments)   Hallucination   Raloxifene Itching   Evista- Face and eyes burning   Vitamin D Analogs Nausea Only        Medication List  PAUSE taking these medications    apixaban 5 MG Tabs tablet Wait to take this until: April 15, 2023 Commonly known as: ELIQUIS Take 1 tablet (5 mg total) by mouth 2 (two) times daily.   furosemide 20 MG tablet Wait to take this until: April 14, 2023 Commonly known as: Lasix Take 1 tablet (20 mg total) by mouth See admin instructions. Take 40 mg in the morning and 20 mg at 4 pm.       TAKE these medications    acetaminophen 325 MG tablet Commonly known as: TYLENOL Take 650 mg by mouth every 6 (six) hours as needed for mild pain (pain score 1-3).   alum & mag hydroxide-simeth 200-200-20 MG/5ML suspension Commonly known as: MAALOX/MYLANTA Take 30 mLs by mouth every 6 (six) hours as needed for indigestion or heartburn.   Blood Glucose System Pak Kit Please dispense as One Touch Ultra. Use as directed to monitor FSBS 4x daily. Dx: E11.65   calcium carbonate 500 MG chewable tablet Commonly known as: TUMS - dosed in mg elemental calcium Chew 3 tablets by mouth as needed for heartburn or indigestion.   cetirizine 10 MG tablet Commonly known as: ZYRTEC Take 1 tablet (10 mg total) by mouth daily.   Creon  24000-76000 units Cpep Generic drug: Pancrelipase (Lip-Prot-Amyl) Take 1 capsule (24,000 Units total) by mouth See admin instructions. TAKE 2 CAPSULES BY MOUTH WITH MEALS AND 1 CAP WITH SNACKS x 2   famotidine 40 MG tablet Commonly known as: PEPCID Take 1 tablet (40 mg total) by mouth daily.   ferrous sulfate 325 (65 FE) MG tablet Take 325 mg by mouth daily with breakfast.   FLUoxetine 20 MG capsule Commonly known as: PROZAC Take 1 capsule (20 mg total) by mouth daily.   FreeStyle Libre 3 Reader Devi 1 Device by Does not apply route daily. Use to continuously check blood sugar daily.   FreeStyle Libre 3 Sensor Misc Place 1 sensor on the skin every 14 days. Use to check glucose continuously   gabapentin 100 MG capsule Commonly known as: NEURONTIN Take 1 capsule (100 mg total) by mouth 3 (three) times daily. What changed: when to take this   Insulin Syringe-Needle U-100 31G X 5/16" 0.5 ML Misc Commonly known as: BD Insulin Syringe U/F Use as directed to administer insulin twice daily.   Insulin Syringes (Disposable) U-100 1 ML Misc Use as directed to administer insulin daily.   Lancets Misc. Misc Pt has Accu Chek Aviva Plus - Needs just lancets  Checks BS 4-5 times per day. Disp 3 boxes(90 day Supply)/4 refills Dx code. E11.9   LORazepam 1 MG tablet Commonly known as: ATIVAN TAKE 1/2 TO 1 (ONE-HALF TO ONE) TABLET BY MOUTH TWICE DAILY AS NEEDED What changed: See the new instructions.   magnesium oxide 400 (240 Mg) MG tablet Commonly known as: MAG-OX Take 1 tablet by mouth once daily   metoprolol tartrate 50 MG tablet Commonly known as: LOPRESSOR Take 1 tablet (50 mg total) by mouth 2 (two) times daily. What changed: how much to take   multivitamin capsule Take 1 capsule by mouth every morning.   NovoLIN 70/30 ReliOn (70-30) 100 UNIT/ML injection Generic drug: insulin NPH-regular Human Inject 20-24 Units into the skin See admin instructions. INJECT 24 UNITS  SUBCUTANEOUSLY IN THE MORNING AND 20 IN THE EVENING ( Sliding Scale) Checks 2 hours after pt eats. What changed:  how much to take additional instructions   ondansetron 4 MG tablet Commonly known  as: Zofran Take 1 tablet (4 mg total) by mouth every 8 (eight) hours as needed for nausea or vomiting.   OneTouch Delica Lancets 33G Misc CHECK BLOOD SUGAR 4-5 TIMES PER DAY   OneTouch Ultra Test test strip Generic drug: glucose blood USE 1 STRIP TO CHECK GLUCOSE FIVE TIMES DAILY   pantoprazole 40 MG tablet Commonly known as: PROTONIX Take 1 tablet (40 mg total) by mouth daily.   potassium chloride 10 MEQ tablet Commonly known as: KLOR-CON Take 1 tablet (10 mEq total) by mouth daily.   rosuvastatin 5 MG tablet Commonly known as: CRESTOR Take 1 tablet (5 mg total) by mouth every evening.   SOOTHE OP Apply 2 drops to eye 2 (two) times daily.   triamcinolone cream 0.1 % Commonly known as: KENALOG Apply topically 2 (two) times daily. What changed:  how much to take when to take this        Consultations: None  Procedures/Studies:   CT Head Wo Contrast Result Date: 04/09/2023 CLINICAL DATA:  Head trauma, minor. Fall. Large hematoma to the anterior scalp with bruising to the head, eyes, and nose. EXAM: CT HEAD WITHOUT CONTRAST CT MAXILLOFACIAL WITHOUT CONTRAST CT CERVICAL SPINE WITHOUT CONTRAST TECHNIQUE: Multidetector CT imaging of the head, cervical spine, and maxillofacial structures were performed using the standard protocol without intravenous contrast. Multiplanar CT image reconstructions of the cervical spine and maxillofacial structures were also generated. RADIATION DOSE REDUCTION: This exam was performed according to the departmental dose-optimization program which includes automated exposure control, adjustment of the mA and/or kV according to patient size and/or use of iterative reconstruction technique. COMPARISON:  CT head 09/09/2018.  CT cervical spine 09/03/2018  FINDINGS: CT HEAD FINDINGS Brain: Diffuse cerebral atrophy. Ventricular dilatation consistent with central atrophy. Low-attenuation changes in the deep white matter consistent with small vessel ischemia. No abnormal extra-axial fluid collections. No mass effect or midline shift. Gray-white matter junctions are distinct. Basal cisterns are not effaced. No acute intracranial hemorrhage. Vascular: No hyperdense vessel or unexpected calcification. Skull: Normal. Negative for fracture or focal lesion. Other: Large subcutaneous scalp hematoma over the anterior frontal region. CT MAXILLOFACIAL FINDINGS Osseous: No fracture or mandibular dislocation. No destructive process. Orbits: Postoperative changes in the globes. Globes and extraocular muscles appear otherwise intact and symmetrical. Sinuses: Paranasal sinuses and mastoid air cells are clear. Soft tissues: Soft tissue hematoma over the anterior nasal region. CT CERVICAL SPINE FINDINGS Alignment: Normal. Skull base and vertebrae: Skull base appears intact. Coalition of the C4 and C5 vertebra, likely congenital. No vertebral compression deformities. No focal bone lesion or bone destruction. Soft tissues and spinal canal: No prevertebral soft tissue swelling. No abnormal paraspinal soft tissue mass or infiltration. Vascular calcifications. Disc levels: Degenerative changes throughout the cervical spine with narrowed interspaces and endplate osteophyte formation. Degenerative changes throughout the facet joints. Posterior soft tissue calcifications likely representing dystrophic or ligamentous calcification. Upper chest: Left thyroid gland nodule measuring 2.1 cm diameter. No change since prior study. Lung apices are clear. Other: None. IMPRESSION: 1. No acute intracranial abnormalities. Chronic atrophy and small vessel ischemic changes. 2. Large subcutaneous scalp hematoma over the anterior frontal region extending over the nasal bones. 3. No acute displaced orbital,  nasal, or facial bone fractures. 4. Normal alignment of the cervical spine. No acute displaced fractures. Diffuse degenerative changes. 5. 2.1 cm left thyroid gland nodule unchanged since prior CT from 2020. In the setting of significant comorbidities or limited life expectancy, no follow-up recommended (ref: J Am Coll Radiol.  2015 Feb;12(2): 143-50). Electronically Signed   By: Burman Nieves M.D.   On: 04/09/2023 19:02   CT Cervical Spine Wo Contrast Result Date: 04/09/2023 CLINICAL DATA:  Head trauma, minor. Fall. Large hematoma to the anterior scalp with bruising to the head, eyes, and nose. EXAM: CT HEAD WITHOUT CONTRAST CT MAXILLOFACIAL WITHOUT CONTRAST CT CERVICAL SPINE WITHOUT CONTRAST TECHNIQUE: Multidetector CT imaging of the head, cervical spine, and maxillofacial structures were performed using the standard protocol without intravenous contrast. Multiplanar CT image reconstructions of the cervical spine and maxillofacial structures were also generated. RADIATION DOSE REDUCTION: This exam was performed according to the departmental dose-optimization program which includes automated exposure control, adjustment of the mA and/or kV according to patient size and/or use of iterative reconstruction technique. COMPARISON:  CT head 09/09/2018.  CT cervical spine 09/03/2018 FINDINGS: CT HEAD FINDINGS Brain: Diffuse cerebral atrophy. Ventricular dilatation consistent with central atrophy. Low-attenuation changes in the deep white matter consistent with small vessel ischemia. No abnormal extra-axial fluid collections. No mass effect or midline shift. Gray-white matter junctions are distinct. Basal cisterns are not effaced. No acute intracranial hemorrhage. Vascular: No hyperdense vessel or unexpected calcification. Skull: Normal. Negative for fracture or focal lesion. Other: Large subcutaneous scalp hematoma over the anterior frontal region. CT MAXILLOFACIAL FINDINGS Osseous: No fracture or mandibular  dislocation. No destructive process. Orbits: Postoperative changes in the globes. Globes and extraocular muscles appear otherwise intact and symmetrical. Sinuses: Paranasal sinuses and mastoid air cells are clear. Soft tissues: Soft tissue hematoma over the anterior nasal region. CT CERVICAL SPINE FINDINGS Alignment: Normal. Skull base and vertebrae: Skull base appears intact. Coalition of the C4 and C5 vertebra, likely congenital. No vertebral compression deformities. No focal bone lesion or bone destruction. Soft tissues and spinal canal: No prevertebral soft tissue swelling. No abnormal paraspinal soft tissue mass or infiltration. Vascular calcifications. Disc levels: Degenerative changes throughout the cervical spine with narrowed interspaces and endplate osteophyte formation. Degenerative changes throughout the facet joints. Posterior soft tissue calcifications likely representing dystrophic or ligamentous calcification. Upper chest: Left thyroid gland nodule measuring 2.1 cm diameter. No change since prior study. Lung apices are clear. Other: None. IMPRESSION: 1. No acute intracranial abnormalities. Chronic atrophy and small vessel ischemic changes. 2. Large subcutaneous scalp hematoma over the anterior frontal region extending over the nasal bones. 3. No acute displaced orbital, nasal, or facial bone fractures. 4. Normal alignment of the cervical spine. No acute displaced fractures. Diffuse degenerative changes. 5. 2.1 cm left thyroid gland nodule unchanged since prior CT from 2020. In the setting of significant comorbidities or limited life expectancy, no follow-up recommended (ref: J Am Coll Radiol. 2015 Feb;12(2): 143-50). Electronically Signed   By: Burman Nieves M.D.   On: 04/09/2023 19:02   CT Maxillofacial Wo Contrast Result Date: 04/09/2023 CLINICAL DATA:  Head trauma, minor. Fall. Large hematoma to the anterior scalp with bruising to the head, eyes, and nose. EXAM: CT HEAD WITHOUT CONTRAST CT  MAXILLOFACIAL WITHOUT CONTRAST CT CERVICAL SPINE WITHOUT CONTRAST TECHNIQUE: Multidetector CT imaging of the head, cervical spine, and maxillofacial structures were performed using the standard protocol without intravenous contrast. Multiplanar CT image reconstructions of the cervical spine and maxillofacial structures were also generated. RADIATION DOSE REDUCTION: This exam was performed according to the departmental dose-optimization program which includes automated exposure control, adjustment of the mA and/or kV according to patient size and/or use of iterative reconstruction technique. COMPARISON:  CT head 09/09/2018.  CT cervical spine 09/03/2018 FINDINGS: CT HEAD FINDINGS Brain: Diffuse  cerebral atrophy. Ventricular dilatation consistent with central atrophy. Low-attenuation changes in the deep white matter consistent with small vessel ischemia. No abnormal extra-axial fluid collections. No mass effect or midline shift. Gray-white matter junctions are distinct. Basal cisterns are not effaced. No acute intracranial hemorrhage. Vascular: No hyperdense vessel or unexpected calcification. Skull: Normal. Negative for fracture or focal lesion. Other: Large subcutaneous scalp hematoma over the anterior frontal region. CT MAXILLOFACIAL FINDINGS Osseous: No fracture or mandibular dislocation. No destructive process. Orbits: Postoperative changes in the globes. Globes and extraocular muscles appear otherwise intact and symmetrical. Sinuses: Paranasal sinuses and mastoid air cells are clear. Soft tissues: Soft tissue hematoma over the anterior nasal region. CT CERVICAL SPINE FINDINGS Alignment: Normal. Skull base and vertebrae: Skull base appears intact. Coalition of the C4 and C5 vertebra, likely congenital. No vertebral compression deformities. No focal bone lesion or bone destruction. Soft tissues and spinal canal: No prevertebral soft tissue swelling. No abnormal paraspinal soft tissue mass or infiltration. Vascular  calcifications. Disc levels: Degenerative changes throughout the cervical spine with narrowed interspaces and endplate osteophyte formation. Degenerative changes throughout the facet joints. Posterior soft tissue calcifications likely representing dystrophic or ligamentous calcification. Upper chest: Left thyroid gland nodule measuring 2.1 cm diameter. No change since prior study. Lung apices are clear. Other: None. IMPRESSION: 1. No acute intracranial abnormalities. Chronic atrophy and small vessel ischemic changes. 2. Large subcutaneous scalp hematoma over the anterior frontal region extending over the nasal bones. 3. No acute displaced orbital, nasal, or facial bone fractures. 4. Normal alignment of the cervical spine. No acute displaced fractures. Diffuse degenerative changes. 5. 2.1 cm left thyroid gland nodule unchanged since prior CT from 2020. In the setting of significant comorbidities or limited life expectancy, no follow-up recommended (ref: J Am Coll Radiol. 2015 Feb;12(2): 143-50). Electronically Signed   By: Burman Nieves M.D.   On: 04/09/2023 19:02   DG Chest Portable 1 View Result Date: 04/09/2023 CLINICAL DATA:  Shortness of breath EXAM: PORTABLE CHEST 1 VIEW COMPARISON:  04/17/2022 FINDINGS: Post sternotomy changes. No acute airspace disease or effusion. Normal cardiomediastinal silhouette with aortic atherosclerosis. Clips in the right axilla IMPRESSION: No active disease. Electronically Signed   By: Jasmine Pang M.D.   On: 04/09/2023 17:51       The results of significant diagnostics from this hospitalization (including imaging, microbiology, ancillary and laboratory) are listed below for reference.     Microbiology: Recent Results (from the past 240 hours)  MRSA Next Gen by PCR, Nasal     Status: None   Collection Time: 04/09/23  9:14 PM   Specimen: Nasal Mucosa; Nasal Swab  Result Value Ref Range Status   MRSA by PCR Next Gen NOT DETECTED NOT DETECTED Final    Comment:  (NOTE) The GeneXpert MRSA Assay (FDA approved for NASAL specimens only), is one component of a comprehensive MRSA colonization surveillance program. It is not intended to diagnose MRSA infection nor to guide or monitor treatment for MRSA infections. Test performance is not FDA approved in patients less than 24 years old. Performed at [facility], [address] [place], [place] [address]      Labs:  CBC: Recent Labs  Lab 04/09/23 1714 04/10/23 0520  WBC 7.8 8.4  NEUTROABS 6.2  --   HGB 14.5 12.8  HCT 44.0 40.1  MCV 88.4 90.1  PLT 144* 127*   BMP &GFR Recent Labs  Lab 04/09/23 1714 04/10/23 0520  NA 134* 145  K 5.5* 3.7  CL  88* 102  CO2 29 30  GLUCOSE 759* 199*  BUN 80* 66*  CREATININE 2.18* 1.75*  CALCIUM 10.0 9.6  MG  --  2.7*  PHOS  --  4.0   Estimated Creatinine Clearance: 18 mL/min (A) (by C-G formula based on SCr of 1.75 mg/dL (H)). Liver & Pancreas: No results for input(s): "AST", "ALT", "ALKPHOS", "BILITOT", "PROT", "ALBUMIN" in the last 168 hours. No results for input(s): "LIPASE", "AMYLASE" in the last 168 hours. No results for input(s): "AMMONIA" in the last 168 hours. Diabetic: Recent Labs    04/09/23 1714  HGBA1C 8.1*   Recent Labs  Lab 04/10/23 0743 04/10/23 0843 04/10/23 0951 04/10/23 1111 04/10/23 1151  GLUCAP 158* 117* 213* 179* 184*   Cardiac Enzymes: No results for input(s): "CKTOTAL", "CKMB", "CKMBINDEX", "TROPONINI" in the last 168 hours. No results for input(s): "PROBNP" in the last 8760 hours. Coagulation Profile: Recent Labs  Lab 04/09/23 1714  INR 1.8*   Thyroid Function Tests: No results for input(s): "TSH", "T4TOTAL", "FREET4", "T3FREE", "THYROIDAB" in the last 72 hours. Lipid Profile: No results for input(s): "CHOL", "HDL", "LDLCALC", "TRIG", "CHOLHDL", "LDLDIRECT" in the last 72 hours. Anemia Panel: No results for input(s): "VITAMINB12", "FOLATE", "FERRITIN", "TIBC", "IRON", "RETICCTPCT" in the last 72  hours. Urine analysis:    Component Value Date/Time   COLORURINE YELLOW 04/09/2023 2114   APPEARANCEUR HAZY (A) 04/09/2023 2114   LABSPEC 1.018 04/09/2023 2114   PHURINE 6.0 04/09/2023 2114   GLUCOSEU >=500 (A) 04/09/2023 2114   HGBUR SMALL (A) 04/09/2023 2114   BILIRUBINUR NEGATIVE 04/09/2023 2114   KETONESUR NEGATIVE 04/09/2023 2114   PROTEINUR NEGATIVE 04/09/2023 2114   UROBILINOGEN 0.2 09/02/2014 1445   NITRITE POSITIVE (A) 04/09/2023 2114   LEUKOCYTESUR SMALL (A) 04/09/2023 2114   Sepsis Labs: Invalid input(s): "PROCALCITONIN", "LACTICIDVEN"   SIGNED:  Almon Hercules, MD  [facility] 04/10/2023, 6:18 PM

## 2023-04-13 ENCOUNTER — Telehealth: Payer: Self-pay

## 2023-04-13 NOTE — Telephone Encounter (Signed)
Copied from CRM [id number]. Topic: Clinical - Home Health Verbal Orders >> Apr 13, 2023  9:16 AM Jorje Guild R wrote: Caller/Agency: [facility]   Callback Number: [phone] -Has confidential voicemail   Service Requested: Skilled Nursing ( Patient being seen for Diabetes)  Frequency: 2x a week for 1 week  and also 1x a week for 3 weeks  Any new concerns about the patient? Yes Please confirm : metoprolol tartrate (LOPRESSOR) 50 MG tablet - is what's on script Has been given 25mg  extra each dosage by son as stated to be given per Pickard advised him to do. Can this confirmed as well.  Novolin 70/30- Son is giving patient 44 units in the am and 8 units at night, depending on blood sugar. Wants to know if that is okay.

## 2023-04-17 ENCOUNTER — Other Ambulatory Visit: Payer: Self-pay

## 2023-04-17 ENCOUNTER — Other Ambulatory Visit: Payer: Self-pay | Admitting: Family Medicine

## 2023-04-17 ENCOUNTER — Other Ambulatory Visit: Payer: Self-pay | Admitting: Cardiology

## 2023-04-17 ENCOUNTER — Telehealth: Payer: Self-pay

## 2023-04-17 ENCOUNTER — Ambulatory Visit (INDEPENDENT_AMBULATORY_CARE_PROVIDER_SITE_OTHER): Payer: PPO | Admitting: Family Medicine

## 2023-04-17 ENCOUNTER — Encounter: Payer: Self-pay | Admitting: Family Medicine

## 2023-04-17 VITALS — BP 116/64 | HR 95 | Temp 97.6°F | Ht 63.0 in | Wt 152.0 lb

## 2023-04-17 DIAGNOSIS — K861 Other chronic pancreatitis: Secondary | ICD-10-CM | POA: Diagnosis not present

## 2023-04-17 DIAGNOSIS — Z794 Long term (current) use of insulin: Secondary | ICD-10-CM | POA: Diagnosis not present

## 2023-04-17 DIAGNOSIS — N1832 Chronic kidney disease, stage 3b: Secondary | ICD-10-CM

## 2023-04-17 DIAGNOSIS — E1169 Type 2 diabetes mellitus with other specified complication: Secondary | ICD-10-CM

## 2023-04-17 DIAGNOSIS — G4700 Insomnia, unspecified: Secondary | ICD-10-CM | POA: Diagnosis not present

## 2023-04-17 DIAGNOSIS — I10 Essential (primary) hypertension: Secondary | ICD-10-CM

## 2023-04-17 DIAGNOSIS — R12 Heartburn: Secondary | ICD-10-CM

## 2023-04-17 DIAGNOSIS — J302 Other seasonal allergic rhinitis: Secondary | ICD-10-CM

## 2023-04-17 DIAGNOSIS — K862 Cyst of pancreas: Secondary | ICD-10-CM

## 2023-04-17 DIAGNOSIS — Z951 Presence of aortocoronary bypass graft: Secondary | ICD-10-CM

## 2023-04-17 DIAGNOSIS — N289 Disorder of kidney and ureter, unspecified: Secondary | ICD-10-CM | POA: Diagnosis not present

## 2023-04-17 DIAGNOSIS — I5032 Chronic diastolic (congestive) heart failure: Secondary | ICD-10-CM

## 2023-04-17 DIAGNOSIS — N904 Leukoplakia of vulva: Secondary | ICD-10-CM

## 2023-04-17 DIAGNOSIS — K863 Pseudocyst of pancreas: Secondary | ICD-10-CM

## 2023-04-17 MED ORDER — CREON 24000-76000 UNITS PO CPEP: 24000.0000 [IU] | ORAL_CAPSULE | ORAL | 0 refills | Status: DC

## 2023-04-17 MED ORDER — NYSTATIN 100000 UNIT/GM EX CREA: 1.0000 | TOPICAL_CREAM | Freq: Three times a day (TID) | CUTANEOUS | 0 refills | Status: AC

## 2023-04-17 MED ORDER — INSULIN ASPART PROT & ASPART (70-30 MIX) 100 UNIT/ML ~~LOC~~ SUSP: SUBCUTANEOUS | Status: AC

## 2023-04-17 NOTE — Addendum Note (Signed)
Addended by: Venia Carbon K on: 04/17/2023 03:29 PM   Modules accepted: Orders

## 2023-04-17 NOTE — Telephone Encounter (Signed)
Requested Prescriptions  Pending Prescriptions Disp Refills   glucose blood (ONETOUCH ULTRA) test strip [Pharmacy Med Name: ONE TOUCH ULT 100CT TEST ST IP] 300 each 1    Sig: USE 1 STRIP TO CHECK GLUCOSE FIVE TIMES DAILY *NEW PRESCRIPTION REQUEST*     Endocrinology: Diabetes - Testing Supplies Failed - 04/17/2023  4:50 PM      Failed - Valid encounter within last 12 months    Recent Outpatient Visits           1 year ago Type 2 diabetes mellitus with other specified complication, with long-term current use of insulin (HCC)   [facility], Priscille Heidelberg, MD   2 years ago Type 2 diabetes mellitus with other specified complication, with long-term current use of insulin (HCC)   [facility] Pickard, Priscille Heidelberg, MD   3 years ago Type 2 diabetes mellitus with other specified complication, with long-term current use of insulin (HCC)   [facility] Pickard, Priscille Heidelberg, MD   3 years ago Hospital discharge follow-up   [facility] Donita Brooks, MD   4 years ago Acute pain of both knees   [facility] Pickard, Priscille Heidelberg, MD               cetirizine (ALLERGY RELIEF CETIRIZINE) 10 MG tablet [Pharmacy Med Name: CETIRIZINE 10MG  TAB 10 Tablet] 90 tablet 1    Sig: TAKE 1 TABLET BY MOUTH DAILY *NEW PRESCRIPTION REQUEST*     Ear, Nose, and Throat:  Antihistamines 2 Failed - 04/17/2023  4:50 PM      Failed - Cr in normal range and within 360 days    Creat  Date Value Ref Range Status  11/06/2022 1.43 (H) 0.60 - 0.95 mg/dL Final   Creatinine, Ser  Date Value Ref Range Status  04/10/2023 1.75 (H) 0.44 - 1.00 mg/dL Final         Failed - Valid encounter within last 12 months    Recent Outpatient Visits           1 year ago Type 2 diabetes mellitus with other specified complication, with long-term current use of insulin (HCC)   [facility] Donita Brooks, MD   2 years ago Type 2 diabetes  mellitus with other specified complication, with long-term current use of insulin (HCC)   [facility] Pickard, Priscille Heidelberg, MD   3 years ago Type 2 diabetes mellitus with other specified complication, with long-term current use of insulin (HCC)   [facility] Pickard, Priscille Heidelberg, MD   3 years ago Hospital discharge follow-up   [facility] Donita Brooks, MD   4 years ago Acute pain of both knees   [facility] Pickard, Priscille Heidelberg, MD               famotidine (PEPCID) 40 MG tablet [Pharmacy Med Name: FAMOTIDINE 40 MG TABS 40 Tablet] 90 tablet 1    Sig: TAKE 1 TABLET BY MOUTH DAILY *NEW PRESCRIPTION REQUEST*     Gastroenterology:  H2 Antagonists Failed - 04/17/2023  4:50 PM      Failed - Valid encounter within last 12 months    Recent Outpatient Visits           1 year ago Type 2 diabetes mellitus with other specified complication, with long-term current use of insulin (HCC)   [facility] [place],  Priscille Heidelberg, MD   2 years ago Type 2 diabetes mellitus with other specified complication, with long-term current use of insulin (HCC)   [facility] Donita Brooks, MD   3 years ago Type 2 diabetes mellitus with other specified complication, with long-term current use of insulin (HCC)   [facility] Pickard, Priscille Heidelberg, MD   3 years ago Hospital discharge follow-up   [facility] Donita Brooks, MD   4 years ago Acute pain of both knees   [facility] Pickard, Priscille Heidelberg, MD               Continuous Glucose Sensor (FREESTYLE LIBRE 3 SENSOR) MISC [Pharmacy Med Name: FREESTYLE LIBRE 3 SENSOR Miscellaneous] 6 each 1    Sig: USE TO CHECK BLOOD GLUCOSE LEVELS CONTINUOUSLY AND CHANGE EVERY 15 DAYS *NEW PRESCRIPTION REQUEST*     Endocrinology: Diabetes - Testing Supplies Failed - 04/17/2023  4:50 PM      Failed - Valid encounter within last 12  months    Recent Outpatient Visits           1 year ago Type 2 diabetes mellitus with other specified complication, with long-term current use of insulin (HCC)   [facility] Pickard, Priscille Heidelberg, MD   2 years ago Type 2 diabetes mellitus with other specified complication, with long-term current use of insulin (HCC)   [facility] Pickard, Priscille Heidelberg, MD   3 years ago Type 2 diabetes mellitus with other specified complication, with long-term current use of insulin (HCC)   [facility] Pickard, Priscille Heidelberg, MD   3 years ago Hospital discharge follow-up   [facility] Donita Brooks, MD   4 years ago Acute pain of both knees   [facility] Pickard, Priscille Heidelberg, MD               rosuvastatin (CRESTOR) 5 MG tablet [Pharmacy Med Name: ROSUVASTATIN 5MG  TAB 5 Tablet] 90 tablet 1    Sig: TAKE 1 TABLET BY MOUTH EACH EVENING *NEW PRESCRIPTION REQUEST*     Cardiovascular:  Antilipid - Statins 2 Failed - 04/17/2023  4:50 PM      Failed - Cr in normal range and within 360 days    Creat  Date Value Ref Range Status  11/06/2022 1.43 (H) 0.60 - 0.95 mg/dL Final   Creatinine, Ser  Date Value Ref Range Status  04/10/2023 1.75 (H) 0.44 - 1.00 mg/dL Final         Failed - Valid encounter within last 12 months    Recent Outpatient Visits           1 year ago Type 2 diabetes mellitus with other specified complication, with long-term current use of insulin (HCC)   [facility] Pickard, Priscille Heidelberg, MD   2 years ago Type 2 diabetes mellitus with other specified complication, with long-term current use of insulin (HCC)   [facility] Donita Brooks, MD   3 years ago Type 2 diabetes mellitus with other specified complication, with long-term current use of insulin (HCC)   [facility] Pickard, Priscille Heidelberg, MD   3 years ago Hospital discharge follow-up   [facility]  Donita Brooks, MD   4 years ago Acute pain of both knees   [facility] Pickard, Priscille Heidelberg, MD  Failed - Lipid Panel in normal range within the last 12 months    Cholesterol  Date Value Ref Range Status  11/06/2022 119 <200 mg/dL Final   LDL Cholesterol (Calc)  Date Value Ref Range Status  11/06/2022 43 mg/dL (calc) Final    Comment:    Reference range: <100 . Desirable range <100 mg/dL for primary prevention;   <70 mg/dL for patients with CHD or diabetic patients  with > or = 2 CHD risk factors. Marland Kitchen LDL-C is now calculated using the Martin-Hopkins  calculation, which is a validated novel method providing  better accuracy than the Friedewald equation in the  estimation of LDL-C.  Horald Pollen et al. Lenox Ahr. 4098;119(14): 2061-2068  (http://education.[employer].com/faq/FAQ164)    HDL  Date Value Ref Range Status  11/06/2022 55 > OR = 50 mg/dL Final   Triglycerides  Date Value Ref Range Status  11/06/2022 130 <150 mg/dL Final  78/29/5621 90  Final         Passed - Patient is not pregnant       Insulin Syringe-Needle U-100 (BD INSULIN SYRINGE U/F) 31G X 5/16" 0.5 ML MISC [Pharmacy Med Name: BD INS SYR 0.5ML 31GX5/16" 31 GX5/16" SYRG] 300 each 1    Sig: USE AS DIRECTED TO ADMINISTER INSULIN TWICE DAILY *NEW PRESCRIPTION REQUEST*     There is no refill protocol information for this order

## 2023-04-17 NOTE — Telephone Encounter (Signed)
Requested medication (s) are due for refill today: Yes  Requested medication (s) are on the active medication list: Yes  Last refill:  04/17/23  Future visit scheduled: Yes  Notes to clinic:  Unable to refill per protocol, cannot delegate. Last OV 04/07/23     Requested Prescriptions  Pending Prescriptions Disp Refills   triamcinolone cream (KENALOG) 0.1 % [Pharmacy Med Name: TRIAMCINOL 0.1% 30GM CRM 0.1 % Cream] 30 g 10    Sig: APPLY TOPICALLY TO AFFECTED AREA 2-3 TIMES A DAY AS NEEDED *NEW PRESCRIPTION REQUEST*     Not Delegated - Dermatology:  Corticosteroids Failed - 04/17/2023  4:36 PM      Failed - This refill cannot be delegated      Failed - Valid encounter within last 12 months    Recent Outpatient Visits           1 year ago Type 2 diabetes mellitus with other specified complication, with long-term current use of insulin (HCC)   [facility] Pickard, Priscille Heidelberg, MD   2 years ago Type 2 diabetes mellitus with other specified complication, with long-term current use of insulin (HCC)   [facility] Pickard, Priscille Heidelberg, MD   3 years ago Type 2 diabetes mellitus with other specified complication, with long-term current use of insulin (HCC)   [facility] Pickard, Priscille Heidelberg, MD   3 years ago Hospital discharge follow-up   [facility] Donita Brooks, MD   4 years ago Acute pain of both knees   [facility] Pickard, Priscille Heidelberg, MD               ondansetron (ZOFRAN-ODT) 4 MG disintegrating tablet [Pharmacy Med Name: ONDANSETRON 4MG  ODT TAB 4 ODT] 120 tablet 10    Sig: DISSOLVE 1 TABLET UNDER TONGUE EVERY 6 HOURS AS NEEDED *NEW PRESCRIPTION REQUEST*     Not Delegated - Gastroenterology: Antiemetics - ondansetron Failed - 04/17/2023  4:36 PM      Failed - This refill cannot be delegated      Failed - AST in normal range and within 360 days    AST  Date Value Ref Range Status  11/06/2022 41 (H)  10 - 35 U/L Final  10/15/2011 18 U/L Final         Failed - ALT in normal range and within 360 days    ALT  Date Value Ref Range Status  11/06/2022 31 (H) 6 - 29 U/L Final         Failed - Valid encounter within last 6 months    Recent Outpatient Visits           1 year ago Type 2 diabetes mellitus with other specified complication, with long-term current use of insulin (HCC)   [facility] Pickard, Priscille Heidelberg, MD   2 years ago Type 2 diabetes mellitus with other specified complication, with long-term current use of insulin (HCC)   [facility] Pickard, Priscille Heidelberg, MD   3 years ago Type 2 diabetes mellitus with other specified complication, with long-term current use of insulin (HCC)   [facility] Pickard, Priscille Heidelberg, MD   3 years ago Hospital discharge follow-up   [facility] Donita Brooks, MD   4 years ago Acute pain of both knees   [facility] Pickard, Priscille Heidelberg, MD

## 2023-04-17 NOTE — Progress Notes (Signed)
Subjective:    Patient ID: Misty Edwards, female    DOB: [birth year], 89 y.o.   MRN: [id number]  Admit date: 04/09/2023 Discharge date: 04/10/2023   Hospital course 89 year old F with PMH of IDDM-2, A-fib on Eliquis, diastolic CHF, CKD-3B, CAD, HTN and HLD presented to urgent care after she had accidental fall due to bilateral leg weakness, and sent to ED for further evaluation.  Patient woke up in the morning and went to the bathroom when her legs became weak and she sustained a fall hitting her head against the wall without losing consciousness.  She denies prodromes leading to this.  She denies chest pain, dyspnea, palpitation or dizziness.  She denies focal neurosymptoms.  She sustained some bruising and hematoma to anterior scalp/face.  She is on Eliquis for A-fib.   In ED, mild RVR to 110.  Slightly hypertensive.  CBC without significant finding.  Glucose 759.  VBG basically normal.  Bicarb 29.  AG 17.  Cr 2.2 (baseline 1.4).  BUN 80.  K5.5.  CT head without contrast without acute intracranial finding but large subcutaneous scalp hematoma on the right anterior frontal region extending over the nasal bones.  CT maxillofacial CT showed no acute displaced orbital nasal or facial bone fracture.  CT cervical spine without acute finding.  Patient was given IV Cardizem x 1 for A-fib with mild RVR.  She was started on IV fluid hydration and Endo tool for hyperglycemic crisis, and admitted.   The next day, RVR resolved.  Hyperglycemia improved. Cr down to 1.75.  She was transitioned to subcu insulin, and CBG remained within acceptable range.  Evaluated by therapy.  She felt well and ready to go home.  She is discharged with home health.  Patient will continue home insulin 70/30 per recommendation by her PCP.  Advised to hold Lasix and Eliquis for 4 to 5 days.  Follow-up with primary care doctor next week.  Discharge plan discussed with patient's daughter-in-law over the phone.  Written discharge  instruction provided as well.   See individual problem list below for more.    Problems addressed during this hospitalization Uncontrolled IDDM-2 with hyperglycemia: Not in DKA or HHS.  Likely due to dehydration or vice versa.  May be missed insulin.  A1c 8.1%.  Hyperglycemia improved.  Fall at home/scalp hematoma: Looks accidental.  CT head, maxillofacial and CT cervical spine as above. -Advised to hold Eliquis for 4 to 5 days. -Recommend risk and benefit discussion given risk of fall -HH PT/OT.  Patient has rolling walker already.   Dehydration/AKI on CKD-3B: Improved. -Advised to hold Lasix for 4 to 5 days. -Recheck renal function in 1 week   Hyperkalemia: Likely due to AKI.  Resolved.   Thrombocytopenia possibly due to reactive process -Recheck at follow-up.   Prolonged QT: QTc 509. -Avoid QT prolonging drugs   Persistent atrial fibrillation with RVR: RVR resolved. -Continue home metoprolol -Hold Eliquis for 4 to 5 days   Essential hypertension: Normotensive -Continue amlodipine, Lopressor   Mixed hyperlipidemia -Continue Crestor   Chronic HFpEF: Appears euvolemic.  May be dehydrated on presentation. Echo done on 03/02/2022 showed LVEF of 60 to 65%.  Positive RWMA.  Moderate asymmetric LVH.  LV diastolic parameters are indeterminate. -Hold home Lasix for 4 to 5 days -Outpatient follow-up with cardiology    04/17/23 Patient is currently on 70/30 insulin.  She is taking 44 units in the morning and 8 units in the evening per her family.  Practically around  lunchtime and rise greater than 200 at noon.  She usually eats dinner around 3 or 4:00.  That is when she takes her evening dose of insulin.  Her sugars typically fall to 100 around 6 PM and then stay 100-150 after that.  Therefore the evening dose of insulin seems to be adequate however her morning dose seems to be insufficient.  She has significant bruising on her forehead, around her eyes, on her cheeks.  She denies any  loss of consciousness.  She has a mild headache but otherwise is doing well.  She is eating and drinking.  She denies any dizziness.  Since leaving the hospital, her family has stopped gabapentin and lorazepam.  The patient states that the burning pain in her feet has returned but she is managing it.  However she is unable to sleep at all. Past Medical History:  Diagnosis Date   Acute biliary pancreatitis 07/2002   thia was in 07/2004:she still has pseudocyst in tail of pancreas measuring 54 x 33 mm    Adrenal adenoma    bilateral   Anxiety    Atrial fibrillation (HCC)    CAD (coronary artery disease)    a. s/p CABG in 08/2014 with LIMA-LAD, SVG-OM and SVG-PDA   Chronic pancreatitis (HCC)    Detached retina    Diabetes mellitus (HCC)    Diverticulosis    Heartburn    History of carcinoma in situ of breast 1988   Hyperlipidemia    Hypertension    IBS (irritable bowel syndrome)    Legally blind    Osteoarthritis    Osteopenia    Upper GI bleed September2004   secondary to gastritis   Past Surgical History:  Procedure Laterality Date   CARDIAC CATHETERIZATION N/A 08/24/2014   Procedure: Left Heart Cath and Coronary Angiography;  Surgeon: Marykay Lex, MD;  Location: [facility] INVASIVE CV LAB;  Service: Cardiovascular;  Laterality: N/A;   COLONOSCOPY  08/15/05   few tiny diverticula at sigmoid colon/external hemorrhoids but no polyps   COLONOSCOPY  01/08/2012   [id number] diverticulosis. Next colonoscopy in 12/2016   CORONARY ARTERY BYPASS GRAFT N/A 08/24/2014   Procedure: CORONARY ARTERY BYPASS GRAFTING (CABG) x three, using left internal mammary artery and right leg greater saphenous vein harvested endoscopically;  Surgeon: Kerin Perna, MD;  Location: [facility] OR;  Service: Open Heart Surgery;  Laterality: N/A;   ECTOPIC PREGNANCY SURGERY  1950's   ERCP with sphincterotomy  07/2002   ESOPHAGOGASTRODUODENOSCOPY      Gastritis of body.  Otherwise normal   ESOPHAGOGASTRODUODENOSCOPY  01/08/2012    RMR: Few scattered gastric erosions of uncertain significance-status post biopsy. Minimal chronic inflammation, no H.pylori   LAPAROSCOPIC CHOLECYSTECTOMY     MASTECTOMY Right 1980   PANCREATIC PSEUDOCYST DRAINAGE  [id number]   drained percutaneously    right mastectomy     tacking up of her bladder     TEE WITHOUT CARDIOVERSION N/A 08/24/2014   Procedure: TRANSESOPHAGEAL ECHOCARDIOGRAM (TEE);  Surgeon: Kerin Perna, MD;  Location: [facility] OR;  Service: Open Heart Surgery;  Laterality: N/A;   Current Outpatient Medications on File Prior to Visit  Medication Sig Dispense Refill   acetaminophen (TYLENOL) 325 MG tablet Take 650 mg by mouth every 6 (six) hours as needed for mild pain (pain score 1-3).     alum & mag hydroxide-simeth (MAALOX/MYLANTA) 200-200-20 MG/5ML suspension Take 30 mLs by mouth every 6 (six) hours as needed for indigestion or heartburn.     apixaban (  ELIQUIS) 5 MG TABS tablet Take 1 tablet (5 mg total) by mouth 2 (two) times daily. 180 tablet 1   Blood Glucose Monitoring Suppl (BLOOD GLUCOSE SYSTEM PAK) KIT Please dispense as One Touch Ultra. Use as directed to monitor FSBS 4x daily. Dx: E11.65 1 kit 1   calcium carbonate (TUMS - DOSED IN MG ELEMENTAL CALCIUM) 500 MG chewable tablet Chew 3 tablets by mouth as needed for heartburn or indigestion.     cetirizine (ZYRTEC) 10 MG tablet Take 1 tablet (10 mg total) by mouth daily. 90 tablet 1   Continuous Blood Gluc Receiver (FREESTYLE LIBRE 3 READER) DEVI 1 Device by Does not apply route daily. Use to continuously check blood sugar daily. 1 each 1   Continuous Glucose Sensor (FREESTYLE LIBRE 3 SENSOR) MISC Place 1 sensor on the skin every 14 days. Use to check glucose continuously 2 each 5   CREON [id number] units CPEP Take 1 capsule (24,000 Units total) by mouth See admin instructions. TAKE 2 CAPSULES BY MOUTH WITH MEALS AND 1 CAP WITH SNACKS x 2 240 capsule 0   famotidine (PEPCID) 40 MG tablet Take 1 tablet (40 mg total) by mouth  daily. 90 tablet 3   ferrous sulfate 325 (65 FE) MG tablet Take 325 mg by mouth daily with breakfast.     FLUoxetine (PROZAC) 20 MG capsule Take 1 capsule (20 mg total) by mouth daily. 90 capsule 1   furosemide (LASIX) 20 MG tablet Take 1 tablet (20 mg total) by mouth See admin instructions. Take 40 mg in the morning and 20 mg at 4 pm.     gabapentin (NEURONTIN) 100 MG capsule Take 1 capsule (100 mg total) by mouth 3 (three) times daily. (Patient taking differently: Take 100 mg by mouth daily.) 90 capsule 2   glucose blood (ONETOUCH ULTRA TEST) test strip USE 1 STRIP TO CHECK GLUCOSE FIVE TIMES DAILY 300 each 2   insulin NPH-regular Human (NOVOLIN 70/30 RELION) (70-30) 100 UNIT/ML injection Inject 20-24 Units into the skin See admin instructions. INJECT 24 UNITS SUBCUTANEOUSLY IN THE MORNING AND 20 IN THE EVENING ( Sliding Scale) Checks 2 hours after pt eats. (Patient taking differently: Inject 8-44 Units into the skin See admin instructions. INJECT 44 UNITS SUBCUTANEOUSLY IN THE MORNING AND 8 IN THE EVENING ( Sliding Scale) Checks 2 hours after pt eats.)     Insulin Syringe-Needle U-100 (BD INSULIN SYRINGE U/F) 31G X 5/16" 0.5 ML MISC Use as directed to administer insulin twice daily. 200 each 3   Insulin Syringes, Disposable, U-100 1 ML MISC Use as directed to administer insulin daily. 100 each 3   Lancets Misc. MISC Pt has Accu Chek Aviva Plus - Needs just lancets  Checks BS 4-5 times per day. Disp 3 boxes(90 day Supply)/4 refills Dx code. E11.9 450 each 3   LORazepam (ATIVAN) 1 MG tablet TAKE 1/2 TO 1 (ONE-HALF TO ONE) TABLET BY MOUTH TWICE DAILY AS NEEDED (Patient taking differently: Take 1 mg by mouth at bedtime.) 30 tablet 0   magnesium oxide (MAG-OX) 400 (240 Mg) MG tablet Take 1 tablet by mouth once daily 30 tablet 5   metoprolol tartrate (LOPRESSOR) 50 MG tablet Take 1 tablet (50 mg total) by mouth 2 (two) times daily. (Patient taking differently: Take 75 mg by mouth 2 (two) times daily.) 180  tablet 3   Multiple Vitamin (MULTIVITAMIN) capsule Take 1 capsule by mouth every morning.      ondansetron (ZOFRAN) 4 MG tablet Take 1  tablet (4 mg total) by mouth every 8 (eight) hours as needed for nausea or vomiting. 20 tablet 0   OneTouch Delica Lancets 33G MISC CHECK BLOOD SUGAR 4-5 TIMES PER DAY 300 each 0   pantoprazole (PROTONIX) 40 MG tablet Take 1 tablet (40 mg total) by mouth daily. 90 tablet 3   potassium chloride (KLOR-CON) 10 MEQ tablet Take 1 tablet (10 mEq total) by mouth daily. 90 tablet 3   Propylene Glycol-Glycerin (SOOTHE OP) Apply 2 drops to eye 2 (two) times daily.     rosuvastatin (CRESTOR) 5 MG tablet Take 1 tablet (5 mg total) by mouth every evening. 90 tablet 1   triamcinolone cream (KENALOG) 0.1 % Apply topically 2 (two) times daily. (Patient taking differently: Apply 1 Application topically 3 (three) times daily.) 60 g 0   No current facility-administered medications on file prior to visit.   Allergies  Allergen Reactions   Calcium-Containing Compounds Nausea Only   Morphine And Codeine Other (See Comments)    Hallucination   Raloxifene Itching    Evista- Face and eyes burning   Vitamin D Analogs Nausea Only   Social History   Socioeconomic History   Marital status: Widowed    Spouse name: Not on file   Number of children: 5   Years of education: Not on file   Highest education level: Not on file  Occupational History   Occupation: homemaker  Tobacco Use   Smoking status: Never   Smokeless tobacco: Never  Vaping Use   Vaping status: Never Used  Substance and Sexual Activity   Alcohol use: No    Alcohol/week: 0.0 standard drinks of alcohol   Drug use: No   Sexual activity: Not Currently    Birth control/protection: Post-menopausal  Other Topics Concern   Not on file  Social History Narrative   Lives in [place].   Social Drivers of Corporate investment banker Strain: Not on file  Food Insecurity: No Food Insecurity (04/09/2023)   Hunger  Vital Sign    Worried About Running Out of Food in the Last Year: Never true    Ran Out of Food in the Last Year: Never true  Transportation Needs: No Transportation Needs (04/09/2023)   PRAPARE - Administrator, Civil Service (Medical): No    Lack of Transportation (Non-Medical): No  Physical Activity: Not on file  Stress: Not on file  Social Connections: Unknown (04/09/2023)   Social Connection and Isolation Panel [NHANES]    Frequency of Communication with Friends and Family: More than three times a week    Frequency of Social Gatherings with Friends and Family: More than three times a week    Attends Religious Services: Patient declined    Database administrator or Organizations: Yes    Attends Banker Meetings: 1 to 4 times per year    Marital Status: Widowed  Intimate Partner Violence: Not At Risk (04/09/2023)   Humiliation, Afraid, Rape, and Kick questionnaire    Fear of Current or Ex-Partner: No    Emotionally Abused: No    Physically Abused: No    Sexually Abused: No          Review of Systems  All other systems reviewed and are negative.      Objective:   Physical Exam Constitutional:      General: She is not in acute distress.    Appearance: Normal appearance. She is normal weight. She is not ill-appearing or toxic-appearing.  Cardiovascular:     Rate and Rhythm: Normal rate. Rhythm irregular.     Heart sounds: Normal heart sounds. No murmur heard.    No friction rub. No gallop.  Pulmonary:     Effort: Pulmonary effort is normal. No respiratory distress.     Breath sounds: No stridor. No wheezing, rhonchi or rales.  Chest:     Chest wall: No tenderness.  Abdominal:     General: Abdomen is flat. Bowel sounds are normal. There is no distension.     Palpations: Abdomen is soft. There is no mass.     Tenderness: There is no abdominal tenderness.  Musculoskeletal:     Right lower leg: No edema.     Left lower leg: No edema.  Skin:     Findings: No erythema or rash.  Neurological:     General: No focal deficit present.     Mental Status: She is alert and oriented to person, place, and time. Mental status is at baseline.     Cranial Nerves: No cranial nerve deficit.     Sensory: No sensory deficit.     Motor: Weakness present.     Coordination: Coordination normal.   Wartlike lesion on the right side of her nasal bridge.  She would like this treated     Assessment & Plan:  Type 2 diabetes mellitus with other specified complication, with long-term current use of insulin (HCC) - Plan: CBC with Differential/Platelet, BASIC METABOLIC PANEL WITH GFR  Renal insufficiency  Insomnia, unspecified type Increase insulin in the morning to 50 units to try to reduce her hyperglycemia around lunchtime.  Continue 8 units in the evening as this seems to be working well.  Patient does not appear fluid overloaded today.  She is back on her Lasix.  I will recheck her renal function to ensure that she is not becoming dehydrated as she was in the hospital.  Recommended she avoid gabapentin.  Patient request to resume lorazepam as she is unable to sleep.  She has been on this medication for years.  I recommended trying just half a pill instead of the whole pill to reduce the risk of falls.

## 2023-04-17 NOTE — Telephone Encounter (Signed)
Pt states before she left that she needs an order to D/C her oxygen at home. Pt and pt's granddaughter in law both state pt has not used the o2 since last summer. Can we send an order to [facility] to cancel the O2? Thanks.

## 2023-04-18 LAB — CBC WITH DIFFERENTIAL/PLATELET
Absolute Lymphocytes: 1882 {cells}/uL (ref 850–3900)
Absolute Monocytes: 761 {cells}/uL (ref 200–950)
Basophils Absolute: 30 {cells}/uL (ref 0–200)
Basophils Relative: 0.5 %
Eosinophils Absolute: 112 {cells}/uL (ref 15–500)
Eosinophils Relative: 1.9 %
HCT: 39.1 % (ref 35.0–45.0)
Hemoglobin: 12.4 g/dL (ref 11.7–15.5)
MCH: 28.6 pg (ref 27.0–33.0)
MCHC: 31.7 g/dL — ABNORMAL LOW (ref 32.0–36.0)
MCV: 90.1 fL (ref 80.0–100.0)
MPV: 12.5 fL (ref 7.5–12.5)
Monocytes Relative: 12.9 %
Neutro Abs: 3115 {cells}/uL (ref 1500–7800)
Neutrophils Relative %: 52.8 %
Platelets: 151 10*3/uL (ref 140–400)
RBC: 4.34 10*6/uL (ref 3.80–5.10)
RDW: 13.3 % (ref 11.0–15.0)
Total Lymphocyte: 31.9 %
WBC: 5.9 10*3/uL (ref 3.8–10.8)

## 2023-04-18 LAB — BASIC METABOLIC PANEL WITH GFR
BUN/Creatinine Ratio: 28 (calc) — ABNORMAL HIGH (ref 6–22)
BUN: 43 mg/dL — ABNORMAL HIGH (ref 7–25)
CO2: 28 mmol/L (ref 20–32)
Calcium: 9.2 mg/dL (ref 8.6–10.4)
Chloride: 100 mmol/L (ref 98–110)
Creat: 1.54 mg/dL — ABNORMAL HIGH (ref 0.60–0.95)
Glucose, Bld: 154 mg/dL — ABNORMAL HIGH (ref 65–99)
Potassium: 4.6 mmol/L (ref 3.5–5.3)
Sodium: 138 mmol/L (ref 135–146)
eGFR: 31 mL/min/{1.73_m2} — ABNORMAL LOW (ref >=60)

## 2023-04-20 ENCOUNTER — Other Ambulatory Visit: Payer: Self-pay | Admitting: *Deleted

## 2023-04-20 NOTE — Telephone Encounter (Addendum)
Prescription refill request for Eliquis received. Indication: AF Last office visit: 09/15/22  B Strader PA-C Scr: 1.54 on 04/17/23  Epic Age: 89 Weight: 66.7kg  Based on above findings Eliquis 2.5mg  twice daily is the appropriate dose.  Pt is taking Eliquis 5mg  twice daily.  Message sent to PharmD Pool for recommendations on dose reduction.  Per pharmacy reduce Eliquis dose to 2.5mg  twice daily.  New Rx sent to [facility].

## 2023-04-21 ENCOUNTER — Telehealth: Payer: Self-pay

## 2023-04-21 NOTE — Telephone Encounter (Signed)
 Copied from CRM [id number]. Topic: Clinical - Medical Advice >> Apr 21, 2023 10:36 AM Curlee DEL wrote: Reason for CRM: Patient started with a runny nose on Saturday, Patient has been coughing up some yellow phlegm - Her oxygen was good at 95% - Patient has been taking Coricidin HBP Max Cold and Flu - Home Health Nurse, Camie with [facility] is calling to see if she needs to do anything different with her symptoms.

## 2023-04-22 ENCOUNTER — Other Ambulatory Visit: Payer: Self-pay | Admitting: Family Medicine

## 2023-04-22 DIAGNOSIS — R0989 Other specified symptoms and signs involving the circulatory and respiratory systems: Secondary | ICD-10-CM

## 2023-04-22 DIAGNOSIS — I5032 Chronic diastolic (congestive) heart failure: Secondary | ICD-10-CM

## 2023-04-22 DIAGNOSIS — Z794 Long term (current) use of insulin: Secondary | ICD-10-CM

## 2023-04-22 DIAGNOSIS — R12 Heartburn: Secondary | ICD-10-CM

## 2023-04-22 DIAGNOSIS — N904 Leukoplakia of vulva: Secondary | ICD-10-CM

## 2023-04-22 DIAGNOSIS — N1832 Chronic kidney disease, stage 3b: Secondary | ICD-10-CM

## 2023-04-22 DIAGNOSIS — N289 Disorder of kidney and ureter, unspecified: Secondary | ICD-10-CM

## 2023-04-22 NOTE — Telephone Encounter (Signed)
 Last Fill: Libre 3 Sensor: 04/17/23     Zofran : 12/24/22     Protonix : 11/11/22     Syringes: 04/17/23     Lasix : 11/11/22     Kenalog : 01/10/22     Lorazepam : 04/03/23  Last OV: 04/17/23 Next OV: 04/24/23  Routing to provider for review/authorization.

## 2023-04-22 NOTE — Telephone Encounter (Signed)
 Copied from CRM [id number]. Topic: Clinical - Medication Refill >> Apr 22, 2023  5:08 PM Deleta HERO wrote: Most Recent Primary Care Visit:  Provider: DUANNE LOWERS T  Department: [facility]  Visit Type: HOSPITAL FOLLOW UP  Date: 04/17/2023  Medication:  triamcinolone  cream (KENALOG ) 0.1 % ondansetron  (ZOFRAN ) 4 MG tablet Continuous Glucose Sensor (FREESTYLE LIBRE 3 SENSOR) MISC/Receiver Insulin  Syringe-Needle U-100 (BD INSULIN  SYRINGE U/F) 31G X 5/16 0.5 ML MISC pantoprazole  (PROTONIX ) 40 MG tablet furosemide  (LASIX ) 20 MG tablet LORazepam  (ATIVAN ) 1 MG tablet    Has the patient contacted their pharmacy? Yes, pharmacy is requesting the refill. (Agent: If no, request that the patient contact the pharmacy for the refill. If patient does not wish to contact the pharmacy document the reason why and proceed with request.) (Agent: If yes, when and what did the pharmacy advise?)  Is this the correct pharmacy for this prescription? Yes If no, delete pharmacy and type the correct one.  This is the patient's preferred pharmacy:   [facility], [place] - [address] [place] [address] Phone: [phone] Fax: [phone]   Has the prescription been filled recently? No  Is the patient out of the medication? Yes  Has the patient been seen for an appointment in the last year OR does the patient have an upcoming appointment? Yes  Can we respond through MyChart? No  Agent: Please be advised that Rx refills may take up to 3 business days. We ask that you follow-up with your pharmacy.

## 2023-04-23 ENCOUNTER — Ambulatory Visit: Payer: PPO | Attending: Student | Admitting: Student

## 2023-04-23 ENCOUNTER — Encounter: Payer: Self-pay | Admitting: Student

## 2023-04-23 ENCOUNTER — Telehealth: Payer: Self-pay | Admitting: Cardiology

## 2023-04-23 VITALS — BP 100/52 | HR 76 | Ht 63.0 in | Wt 149.0 lb

## 2023-04-23 DIAGNOSIS — I5032 Chronic diastolic (congestive) heart failure: Secondary | ICD-10-CM | POA: Diagnosis not present

## 2023-04-23 DIAGNOSIS — I4811 Longstanding persistent atrial fibrillation: Secondary | ICD-10-CM | POA: Diagnosis not present

## 2023-04-23 DIAGNOSIS — I2510 Atherosclerotic heart disease of native coronary artery without angina pectoris: Secondary | ICD-10-CM

## 2023-04-23 DIAGNOSIS — E785 Hyperlipidemia, unspecified: Secondary | ICD-10-CM | POA: Diagnosis not present

## 2023-04-23 DIAGNOSIS — N1832 Chronic kidney disease, stage 3b: Secondary | ICD-10-CM

## 2023-04-23 DIAGNOSIS — I059 Rheumatic mitral valve disease, unspecified: Secondary | ICD-10-CM

## 2023-04-23 DIAGNOSIS — I10 Essential (primary) hypertension: Secondary | ICD-10-CM

## 2023-04-23 MED ORDER — TRIAMCINOLONE ACETONIDE 0.1 % EX CREA: TOPICAL_CREAM | Freq: Two times a day (BID) | CUTANEOUS | 0 refills | Status: DC

## 2023-04-23 MED ORDER — FUROSEMIDE 20 MG PO TABS: 20.0000 mg | ORAL_TABLET | ORAL | Status: DC

## 2023-04-23 MED ORDER — POTASSIUM CHLORIDE ER 10 MEQ PO TBCR: 10.0000 meq | EXTENDED_RELEASE_TABLET | Freq: Every day | ORAL | 3 refills | Status: DC

## 2023-04-23 MED ORDER — FREESTYLE LIBRE 3 SENSOR MISC: 1 refills | Status: DC

## 2023-04-23 MED ORDER — ONDANSETRON HCL 4 MG PO TABS: 4.0000 mg | ORAL_TABLET | Freq: Three times a day (TID) | ORAL | 0 refills | Status: DC | PRN

## 2023-04-23 MED ORDER — INSULIN SYRINGE-NEEDLE U-100 31G X 5/16" 0.5 ML MISC: 1 refills | Status: AC

## 2023-04-23 MED ORDER — LORAZEPAM 1 MG PO TABS: 0.5000 mg | ORAL_TABLET | Freq: Every day | ORAL | 2 refills | Status: DC

## 2023-04-23 MED ORDER — PANTOPRAZOLE SODIUM 40 MG PO TBEC: 40.0000 mg | DELAYED_RELEASE_TABLET | Freq: Every day | ORAL | 3 refills | Status: DC

## 2023-04-23 NOTE — Progress Notes (Signed)
 Cardiology Office Note    Date:  04/23/2023  ID:  DEL WISEMAN, DOB Jun 24, 1926, MRN [id number] Cardiologist: Alvan Carrier, MD    History of Present Illness:    Misty Edwards is a 89 y.o. female with past medical history of CAD (s/p CABG in 08/2014 with LIMA-LAD, SVG-OM and SVG-PDA), chronic HFpEF, persistent atrial fibrillation, HTN, HLD, Type 2 DM, moderate MR and Stage 3 CKD who presents to the office today for 53-month follow-up.  She was last examined by myself in 09/2022 and was using a walker for ambulation but denied any recent exertional chest pain or dyspnea on exertion. Was previously having acid reflux but symptoms resolved once being started on Protonix . No changes were made to her cardiac medications at that time and conservative management of her mitral valve regurgitation was anticipated given her advanced age. Was continued on her current diuretic regimen of Lasix  40 mg in AM/20 mg in PM with instructions to take an extra 20 mg as needed for worsening edema or weight gain.  In the interim, she was admitted to [facility] in 03/2023 after suffering a mechanical fall at home. Was found to have significant hyperglycemia with glucose of 759 on admission and also to have an AKI with creatinine at 2.2. CT Head showed a large subcutaneous scalp hematoma but no acute intracranial abnormalities. Her insulin  was adjusted during admission and she was advised to hold Lasix  and Eliquis  for 4 to 5 days and then restart.  She did follow-up with her PCP and had follow-up labs on 04/17/2023 which showed her creatinine had improved to 1.54. Sodium and potassium were within a normal range and hemoglobin remained stable at 12.4 with platelets at 151K.  In talking  with the patient and her grandson today, they report she has overall been doing well since her hospitalization. She does ambulate with a walker but has been doing this for several years. The ecchymosis along her face has been improving.  She denies any specific chest pain or persistent palpitations. No recent dyspnea on exertion, orthopnea, PND or pitting edema. Her blood pressure is soft at 100/52 during today's visit but she denies any associated dizziness or presyncope.   Studies Reviewed:   EKG: EKG is not ordered today.   Echocardiogram: 02/2022 IMPRESSIONS     1. Left ventricular ejection fraction, by estimation, is 60 to 65%. The  left ventricle has normal function. The left ventricle demonstrates  regional wall motion abnormalities (see scoring diagram/findings for  description). There is moderate asymmetric  left ventricular hypertrophy of the basal-septal segment. Left ventricular  diastolic parameters are indeterminate.   2. Right ventricular systolic function is mildly reduced. The right  ventricular size is normal. There is normal pulmonary artery systolic  pressure. The estimated right ventricular systolic pressure is 31.8 mmHg.   3. Left atrial size was moderately dilated.   4. At least moderate mitral regurgitation with horizontal color splay and  with Doppler evidnece of right sided pulmonary vein flow reversal. The  mitral valve is normal in structure. Moderate mitral valve regurgitation.   5. The aortic valve is tricuspid. Aortic valve regurgitation is trivial.  No aortic stenosis is present.   Comparison(s): Mitral regurgitation has increased.   Risk Assessment/Calculations:    CHA2DS2-VASc Score = 6   This indicates a 9.7% annual risk of stroke. The patient's score is based upon: CHF History: 0 HTN History: 1 Diabetes History: 1 Stroke History: 0 Vascular Disease History: 1 Age Score:  2 Gender Score: 1    Physical Exam:   VS:  BP (!) 100/52 (BP Location: Right Arm, Patient Position: Sitting, Cuff Size: Normal)   Pulse 76   Ht 5' 3 (1.6 m)   Wt 149 lb (67.6 kg)   SpO2 96%   BMI 26.39 kg/m    Wt Readings from Last 3 Encounters:  04/23/23 149 lb (67.6 kg)  04/17/23 152 lb (68.9  kg)  04/10/23 146 lb 6.2 oz (66.4 kg)     GEN: Pleasant, elderly female appearing in no acute distress. Ecchymosis along face.  NECK: No JVD; No carotid bruits CARDIAC: Irregularly irregular, 2/6 systolic murmur along Apex.  RESPIRATORY:  Clear to auscultation without rales, wheezing or rhonchi  ABDOMEN: Appears non-distended. No obvious abdominal masses. EXTREMITIES: No clubbing or cyanosis. No pitting edema.  Distal pedal pulses are 2+ bilaterally.   Assessment and Plan:   1. Chronic HFpEF - She appears euvolemic by examination today and they monitor her fluid and sodium intake closely. Continue current medical therapy with Lasix  40 mg in AM/20 mg in PM. Recent labs showed her renal function had improved with creatinine at 1.54.  2. Persistent Atrial Fibrillation - No recent palpitations and her heart rate is well-controlled in the 70's during today's visit. Will reduce Lopressor  from 75 mg twice daily to 50 mg twice daily given hypotension. - Remains on Eliquis  and dosing was recently reduced to 2.5 mg twice daily given her age and creatinine being greater than 1.5. They are awaiting the new prescription from her pharmacy.  3. CAD/HLD - She underwent CABG in 08/2014 with details as outlined above. Denies any recent anginal symptoms. Not on ASA given the need for anticoagulation.  - LDL was at 43 when checked in 10/2022. Continue current medical therapy with Crestor  5 mg daily.   4. Mitral Valve Regurgitation - This was moderate by most recent echocardiogram in 02/2022. Would anticipate conservative management given her advanced age.  5. HTN - Blood pressure is soft at 100/52 during today's visit. She is currently taking Lopressor  75 mg twice daily and will reduce this back to her prior dose of 50 mg twice daily.  6. Stage 3 CKD - Creatinine was at 1.54 on most recent check from 04/17/2023.  Signed, Laymon CHRISTELLA Qua, PA-C

## 2023-04-23 NOTE — Telephone Encounter (Signed)
*  STAT* If patient is at the pharmacy, call can be transferred to refill team.   1. Which medications need to be refilled? (please list name of each medication and dose if known)   potassium chloride  (KLOR-CON ) 10 MEQ tablet   2. Would you like to learn more about the convenience, safety, & potential cost savings by using the [facility]?   3. Are you open to using the [facility] (Type [facility]. ).  4. Which pharmacy/location (including street and city if local pharmacy) is medication to be sent to?  [facility] - [address] [place] - [address]   5. Do they need a 30 day or 90 day supply?   90 day  Caller Preston) stated patient still has some medication.

## 2023-04-23 NOTE — Telephone Encounter (Signed)
 90 day refill potassium 10 meq sent to [facility] pharmacy

## 2023-04-23 NOTE — Patient Instructions (Addendum)
 Medication Instructions:  Your physician has recommended you make the following change in your medication:   Decrease Lopressor  ( Metoprolol  ) to 50 mg Two Times Daily   Your heartrate (Pulse)  should be between 60-100 beats per minute.   *If you need a refill on your cardiac medications before your next appointment, please call your pharmacy*   Lab Work: NONE   If you have labs (blood work) drawn today and your tests are completely normal, you will receive your results only by: MyChart Message (if you have MyChart) OR A paper copy in the mail If you have any lab test that is abnormal or we need to change your treatment, we will call you to review the results.   Testing/Procedures: NONE    Follow-Up: At [facility], you and your health needs are our priority.  As part of our continuing mission to provide you with exceptional heart care, we have created designated Provider Care Teams.  These Care Teams include your primary Cardiologist (physician) and Advanced Practice Providers (APPs -  Physician Assistants and Nurse Practitioners) who all work together to provide you with the care you need, when you need it.  We recommend signing up for the patient portal called MyChart.  Sign up information is provided on this After Visit Summary.  MyChart is used to connect with patients for Virtual Visits (Telemedicine).  Patients are able to view lab/test results, encounter notes, upcoming appointments, etc.  Non-urgent messages can be sent to your provider as well.   To learn more about what you can do with MyChart, go to [email or web address].    Your next appointment:   6 month(s)  Provider:   You may see Alvan Carrier, MD or one of the following Advanced Practice Providers on your designated Care Team:   Laymon Qua, PA-C  Olivia Pavy, [place]     Other Instructions Thank you for choosing [facility]!

## 2023-04-24 ENCOUNTER — Encounter: Payer: Self-pay | Admitting: Family Medicine

## 2023-04-24 ENCOUNTER — Ambulatory Visit (INDEPENDENT_AMBULATORY_CARE_PROVIDER_SITE_OTHER): Payer: PPO | Admitting: Family Medicine

## 2023-04-24 ENCOUNTER — Other Ambulatory Visit: Payer: Self-pay

## 2023-04-24 VITALS — BP 120/82 | HR 75 | Temp 98.6°F | Ht 63.0 in | Wt 150.0 lb

## 2023-04-24 DIAGNOSIS — I5032 Chronic diastolic (congestive) heart failure: Secondary | ICD-10-CM

## 2023-04-24 DIAGNOSIS — J069 Acute upper respiratory infection, unspecified: Secondary | ICD-10-CM | POA: Insufficient documentation

## 2023-04-24 DIAGNOSIS — I10 Essential (primary) hypertension: Secondary | ICD-10-CM | POA: Diagnosis not present

## 2023-04-24 DIAGNOSIS — R0989 Other specified symptoms and signs involving the circulatory and respiratory systems: Secondary | ICD-10-CM

## 2023-04-24 MED ORDER — ALBUTEROL SULFATE HFA 108 (90 BASE) MCG/ACT IN AERS: 1.0000 | INHALATION_SPRAY | Freq: Four times a day (QID) | RESPIRATORY_TRACT | 0 refills | Status: DC | PRN

## 2023-04-24 MED ORDER — BENZONATATE 100 MG PO CAPS: 100.0000 mg | ORAL_CAPSULE | Freq: Three times a day (TID) | ORAL | 0 refills | Status: DC | PRN

## 2023-04-24 MED ORDER — FUROSEMIDE 20 MG PO TABS: 20.0000 mg | ORAL_TABLET | ORAL | Status: DC

## 2023-04-24 NOTE — Telephone Encounter (Signed)
 Copied from CRM [id number]. Topic: Clinical - Prescription Issue >> Apr 24, 2023 12:47 PM Star East wrote: Reason for CRM: Misty Edwards with [facility] - have not received request for furosemide  (LASIX ) 20 MG tablet, please fax [phone]

## 2023-04-24 NOTE — Progress Notes (Signed)
 Patient Office Visit  Assessment & Plan:  Upper respiratory infection, viral -     Albuterol  Sulfate HFA; Inhale 1-2 puffs into the lungs every 6 (six) hours as needed for wheezing or shortness of breath.  Dispense: 1 each; Refill: 0 -     Benzonatate ; Take 1 capsule (100 mg total) by mouth 3 (three) times daily as needed for cough.  Dispense: 30 capsule; Refill: 0  Chronic heart failure with preserved ejection fraction (HFpEF) (HCC)  Essential hypertension   Pt/daughter will let us  know if no continued improvement. Test results were reviewed and analyzed as part of the medical decision making of this visit- reviewed recent cardiology notes during office visit. Pt will continue with daily weight/low sodium diet and Lasix  as Rx.  Return if symptoms worsen or fail to improve.   Subjective:    Patient ID: Misty Edwards, female    DOB: May 23, 1926  Age: 89 y.o. MRN: [id number]  Chief Complaint  Patient presents with   Cough    Started with nasal congestion and then developed into a cough.     HPI Bronchitis vs viral infection- pt had home health nurse check on her and was told to come in to be checked. Pt feels Ok. Per daughter patient is doing fine and is her usual baseline. Was seen last Friday per Dr. Duanne. Pt has hx of Heart failure and Atrial fib both have been stable. Weight remains stable. Pt saw her cardiologist yesterday and was told to reduce Lopressor  to 50mg  PO BID due to hypotension. No increasing SOB or chest pain or edema Pt having productive cough, congestion for the past few days. Used OTC Coricidin which helps her rest. No SOB or No wheezing. No fever or chills. Pt does not have inhaler on hand.  No previous history of asthma or COPD.  No tobacco. Pt lives with her daughter.   The ASCVD Risk score (Arnett DK, et al., 2019) failed to calculate for the following reasons:   The 2019 ASCVD risk score is only valid for ages 7 to 34   Risk score cannot be calculated  because patient has a medical history suggesting prior/existing ASCVD  Past Medical History:  Diagnosis Date   Acute biliary pancreatitis 07/2002   thia was in 07/2004:she still has pseudocyst in tail of pancreas measuring 54 x 33 mm    Adrenal adenoma    bilateral   Anxiety    Atrial fibrillation (HCC)    CAD (coronary artery disease)    a. s/p CABG in 08/2014 with LIMA-LAD, SVG-OM and SVG-PDA   CAD (coronary artery disease)    a. s/p CABG in 08/2014 with LIMA-LAD, SVG-OM and SVG-PDA   Chronic pancreatitis (HCC)    Detached retina    Diabetes mellitus (HCC)    Diverticulosis    Heartburn    History of carcinoma in situ of breast 1988   Hyperlipidemia    Hypertension    IBS (irritable bowel syndrome)    Legally blind    Osteoarthritis    Osteopenia    Upper GI bleed September2004   secondary to gastritis   Past Surgical History:  Procedure Laterality Date   CARDIAC CATHETERIZATION N/A 08/24/2014   Procedure: Left Heart Cath and Coronary Angiography;  Surgeon: Alm LELON Clay, MD;  [facility]: [facility] INVASIVE CV LAB;  Service: Cardiovascular;  Laterality: N/A;   COLONOSCOPY  08/15/05   few tiny diverticula at sigmoid colon/external hemorrhoids but no polyps   COLONOSCOPY  01/08/2012   MFM:Rnonwpr diverticulosis. Next colonoscopy in 12/2016   CORONARY ARTERY BYPASS GRAFT N/A 08/24/2014   Procedure: CORONARY ARTERY BYPASS GRAFTING (CABG) x three, using left internal mammary artery and right leg greater saphenous vein harvested endoscopically;  Surgeon: Maude Fleeta Ochoa, MD;  Location: [facility] OR;  Service: Open Heart Surgery;  Laterality: N/A;   ECTOPIC PREGNANCY SURGERY  1950's   ERCP with sphincterotomy  07/2002   ESOPHAGOGASTRODUODENOSCOPY      Gastritis of body.  Otherwise normal   ESOPHAGOGASTRODUODENOSCOPY  01/08/2012   RMR: Few scattered gastric erosions of uncertain significance-status post biopsy. Minimal chronic inflammation, no H.pylori   LAPAROSCOPIC CHOLECYSTECTOMY     MASTECTOMY  Right 1980   PANCREATIC PSEUDOCYST DRAINAGE  [id number]   drained percutaneously    right mastectomy     tacking up of her bladder     TEE WITHOUT CARDIOVERSION N/A 08/24/2014   Procedure: TRANSESOPHAGEAL ECHOCARDIOGRAM (TEE);  Surgeon: Maude Fleeta Ochoa, MD;  Location: [facility] OR;  Service: Open Heart Surgery;  Laterality: N/A;   Social History   Tobacco Use   Smoking status: Never   Smokeless tobacco: Never  Vaping Use   Vaping status: Never Used  Substance Use Topics   Alcohol use: No    Alcohol/week: 0.0 standard drinks of alcohol   Drug use: No   Family History  Problem Relation Age of Onset   Ovarian cancer Mother        passed   Colon cancer Father 22       passed away in his 27's   Colon cancer Brother 35       surgery inhis 50's doing well now   Heart attack Brother        passed away age 49   Pancreatitis Brother    Kidney disease Daughter    Leukemia Son    Allergies  Allergen Reactions   Calcium -Containing Compounds Nausea Only   Morphine  And Codeine Other (See Comments)    Hallucination   Raloxifene  Itching    Evista - Face and eyes burning   Vitamin D Analogs Nausea Only    ROS    Objective:    BP 120/82   Pulse 75   Temp 98.6 F (37 C)   Ht 5' 3 (1.6 m)   Wt 150 lb (68 kg)   SpO2 99%   BMI 26.57 kg/m  BP Readings from Last 3 Encounters:  04/24/23 120/82  04/23/23 (!) 100/52  04/17/23 116/64   Wt Readings from Last 3 Encounters:  04/24/23 150 lb (68 kg)  04/23/23 149 lb (67.6 kg)  04/17/23 152 lb (68.9 kg)    Physical Exam Vitals and nursing note reviewed.  Constitutional:      General: She is not in acute distress.    Appearance: Normal appearance.     Comments: Comes in with her daughter. Pt has facial bruising from recent fall   HENT:     Head: Normocephalic.     Right Ear: Tympanic membrane, ear canal and external ear normal.     Left Ear: Tympanic membrane, ear canal and external ear normal.     Nose: Congestion present.      Mouth/Throat:     Pharynx: No oropharyngeal exudate.  Eyes:     Extraocular Movements: Extraocular movements intact.     Pupils: Pupils are equal, round, and reactive to light.  Cardiovascular:     Rate and Rhythm: Normal rate and regular rhythm.     Heart sounds:  Normal heart sounds.  Pulmonary:     Effort: Pulmonary effort is normal.     Breath sounds: Normal breath sounds. No wheezing.  Musculoskeletal:     Right lower leg: No edema.     Left lower leg: No edema.  Neurological:     General: No focal deficit present.     Mental Status: She is alert and oriented to person, place, and time.  Psychiatric:        Mood and Affect: Mood normal.        Behavior: Behavior normal.      No results found for any visits on 04/24/23.

## 2023-04-27 ENCOUNTER — Other Ambulatory Visit: Payer: Self-pay | Admitting: Family Medicine

## 2023-04-27 DIAGNOSIS — N904 Leukoplakia of vulva: Secondary | ICD-10-CM

## 2023-04-29 ENCOUNTER — Telehealth: Payer: Self-pay | Admitting: Cardiology

## 2023-04-29 NOTE — Telephone Encounter (Signed)
Pt c/o medication issue:  1. Name of Medication:   potassium chloride (KLOR-CON) 10 MEQ tablet    2. How are you currently taking this medication (dosage and times per day)?Not sure  3. Are you having a reaction (difficulty breathing--STAT)? No   4. What is your medication issue? Pharmacy is questioning dosage and would like a call back Ref #[id number]

## 2023-04-29 NOTE — Telephone Encounter (Signed)
Verified with pharm tech, pt taking potassium 10 meq daily

## 2023-05-05 NOTE — Telephone Encounter (Unsigned)
Copied from CRM [id number]. Topic: Clinical - Home Health Verbal Orders >> May 05, 2023  2:22 PM Gildardo Pounds wrote: Caller/Agency: Sarah with [facility] Callback Number: [phone] Service Requested: Skilled Nursing Frequency: extend to 1 a week for 3 more weeks for Disease Medication Management. Any new concerns about the patient? Yes, Please verify current insulin dosage for patient. Sugar drops at night. Last night it dropped to 63.

## 2023-05-28 ENCOUNTER — Other Ambulatory Visit: Payer: Self-pay | Admitting: Family Medicine

## 2023-05-28 NOTE — Telephone Encounter (Signed)
 Requested Prescriptions  Pending Prescriptions Disp Refills   glucose blood (ONETOUCH ULTRA) test strip [Pharmacy Med Name: OneTouch Ultra Blue In Vitro Strip] 500 each 0    Sig: USE 1 STRIP TO CHECK GLUCOSE FIVE TIMES DAILY     Endocrinology: Diabetes - Testing Supplies Failed - 05/28/2023  1:44 PM      Failed - Valid encounter within last 12 months    Recent Outpatient Visits           1 year ago Type 2 diabetes mellitus with other specified complication, with long-term current use of insulin (HCC)   [facility] Pickard, Priscille Heidelberg, MD   2 years ago Type 2 diabetes mellitus with other specified complication, with long-term current use of insulin (HCC)   [facility] Donita Brooks, MD   3 years ago Type 2 diabetes mellitus with other specified complication, with long-term current use of insulin (HCC)   [facility] Pickard, Priscille Heidelberg, MD   3 years ago Hospital discharge follow-up   [facility] Donita Brooks, MD   4 years ago Acute pain of both knees   [facility] Pickard, Priscille Heidelberg, MD

## 2023-05-29 ENCOUNTER — Other Ambulatory Visit: Payer: Self-pay | Admitting: Family Medicine

## 2023-05-29 NOTE — Telephone Encounter (Signed)
 Requested medications are due for refill today.  unsure  Requested medications are on the active medications list.  no  Last refill. never  Future visit scheduled.   no  Notes to clinic.  Please review for refill.    Requested Prescriptions  Pending Prescriptions Disp Refills   gabapentin (NEURONTIN) 100 MG capsule [Pharmacy Med Name: GABAPENTIN 100 MG CAPS 100 Capsule] 90 capsule 10    Sig: TAKE 1 CAPSULE BY MOUTH 3 TIMES DAILY     Neurology: Anticonvulsants - gabapentin Failed - 05/29/2023  4:22 PM      Failed - Cr in normal range and within 360 days    Creat  Date Value Ref Range Status  04/17/2023 1.54 (H) 0.60 - 0.95 mg/dL Final         Failed - Valid encounter within last 12 months    Recent Outpatient Visits           1 year ago Type 2 diabetes mellitus with other specified complication, with long-term current use of insulin (HCC)   [facility] Pickard, Priscille Heidelberg, MD   2 years ago Type 2 diabetes mellitus with other specified complication, with long-term current use of insulin (HCC)   [facility] Donita Brooks, MD   3 years ago Type 2 diabetes mellitus with other specified complication, with long-term current use of insulin (HCC)   [facility] Pickard, Priscille Heidelberg, MD   3 years ago Hospital discharge follow-up   [facility] Donita Brooks, MD   4 years ago Acute pain of both knees   [facility] Pickard, Priscille Heidelberg, MD              Passed - Completed PHQ-2 or PHQ-9 in the last 360 days

## 2023-06-08 ENCOUNTER — Other Ambulatory Visit: Payer: Self-pay | Admitting: Cardiology

## 2023-06-08 ENCOUNTER — Other Ambulatory Visit: Payer: Self-pay | Admitting: Family Medicine

## 2023-06-08 DIAGNOSIS — N289 Disorder of kidney and ureter, unspecified: Secondary | ICD-10-CM

## 2023-06-08 DIAGNOSIS — F32A Depression, unspecified: Secondary | ICD-10-CM

## 2023-06-08 DIAGNOSIS — E1169 Type 2 diabetes mellitus with other specified complication: Secondary | ICD-10-CM

## 2023-06-08 DIAGNOSIS — N1832 Chronic kidney disease, stage 3b: Secondary | ICD-10-CM

## 2023-06-08 DIAGNOSIS — I5032 Chronic diastolic (congestive) heart failure: Secondary | ICD-10-CM

## 2023-06-08 NOTE — Telephone Encounter (Signed)
 Prescription refill request for Eliquis received. Indication: AF Last office visit: 04/23/23  B Strader PA-C Scr: 1.54 on 04/17/23  Epic Age: 89 Weight: 67.6kg  Based on above findings Eliquis 2.5mg  twice daily is the appropriate dose.  Refill approved.

## 2023-06-09 NOTE — Telephone Encounter (Signed)
 Requested medication (s) are due for refill today: na   Requested medication (s) are on the active medication list: yes   Last refill:  freestyle libre 3 sensor- 05/16/22 1 each 1 refill, prozac- 11/11/22 #90 1 refills  Future visit scheduled: no   Notes to clinic:  pharmacy requesting 1 year refills. Do you want to refill Rxs?     Requested Prescriptions  Pending Prescriptions Disp Refills   Continuous Glucose Sensor (FREESTYLE LIBRE 3 SENSOR) MISC [Pharmacy Med Name: FREESTYLE LIBRE 3 SENSOR Miscellaneous] 6 each 10    Sig: USE TO CHECK BLOOD GLUCOSE LEVELS CONTINUOUSLY AND CHANGE EVERY 14 DAYS     Endocrinology: Diabetes - Testing Supplies Failed - 06/09/2023 11:53 AM      Failed - Valid encounter within last 12 months    Recent Outpatient Visits           1 year ago Type 2 diabetes mellitus with other specified complication, with long-term current use of insulin (HCC)   [facility] Pickard, Priscille Heidelberg, MD   2 years ago Type 2 diabetes mellitus with other specified complication, with long-term current use of insulin (HCC)   [facility] Pickard, Priscille Heidelberg, MD   3 years ago Type 2 diabetes mellitus with other specified complication, with long-term current use of insulin (HCC)   [facility] Pickard, Priscille Heidelberg, MD   3 years ago Hospital discharge follow-up   [facility] Donita Brooks, MD   4 years ago Acute pain of both knees   [facility] Pickard, Priscille Heidelberg, MD               FLUoxetine (PROZAC) 20 MG capsule [Pharmacy Med Name: FLUOXETINE 20 MG CAP 20 Capsule] 90 capsule 10    Sig: TAKE 1 CAPSULE BY MOUTH ONCE DAILY     Psychiatry:  Antidepressants - SSRI Failed - 06/09/2023 11:53 AM      Failed - Valid encounter within last 6 months    Recent Outpatient Visits           1 year ago Type 2 diabetes mellitus with other specified complication, with long-term current use of insulin (HCC)    [facility] Pickard, Priscille Heidelberg, MD   2 years ago Type 2 diabetes mellitus with other specified complication, with long-term current use of insulin (HCC)   [facility] Donita Brooks, MD   3 years ago Type 2 diabetes mellitus with other specified complication, with long-term current use of insulin (HCC)   [facility] Pickard, Priscille Heidelberg, MD   3 years ago Hospital discharge follow-up   [facility] Donita Brooks, MD   4 years ago Acute pain of both knees   [facility] Pickard, Priscille Heidelberg, MD

## 2023-06-12 ENCOUNTER — Other Ambulatory Visit: Payer: Self-pay | Admitting: Family Medicine

## 2023-06-12 DIAGNOSIS — N904 Leukoplakia of vulva: Secondary | ICD-10-CM

## 2023-06-15 ENCOUNTER — Encounter: Payer: Self-pay | Admitting: Family Medicine

## 2023-06-15 ENCOUNTER — Other Ambulatory Visit: Payer: Self-pay

## 2023-06-15 DIAGNOSIS — K862 Cyst of pancreas: Secondary | ICD-10-CM

## 2023-06-15 DIAGNOSIS — K861 Other chronic pancreatitis: Secondary | ICD-10-CM

## 2023-06-15 MED ORDER — CREON 24000-76000 UNITS PO CPEP: 24000.0000 [IU] | ORAL_CAPSULE | ORAL | 1 refills | Status: DC

## 2023-06-15 NOTE — Telephone Encounter (Signed)
 Requested medications are due for refill today.  unsure  Requested medications are on the active medications list.  yes  Last refill. 04/23/2023 60 g 0 rf  Future visit scheduled.   no  Notes to clinic.  Refill not delegated.    Requested Prescriptions  Pending Prescriptions Disp Refills   triamcinolone cream (KENALOG) 0.1 % [Pharmacy Med Name: TRIAMCINOL 0.1% 30GM CRM 0.1 % Cream] 60 g 10    Sig: APPLY TOPIACLLY TWICE DAILY     Not Delegated - Dermatology:  Corticosteroids Failed - 06/15/2023  3:30 PM      Failed - This refill cannot be delegated      Passed - Valid encounter within last 12 months    Recent Outpatient Visits           1 month ago Upper respiratory infection, viral   [place] [facility] Bernadette Hoit, MD   1 month ago Type 2 diabetes mellitus with other specified complication, with long-term current use of insulin [facility])   [place] [facility] Pickard, Priscille Heidelberg, MD   3 months ago Flu vaccine need   [place] [facility] Donita Brooks, MD   7 months ago Type 2 diabetes mellitus with other specified complication, with long-term current use of insulin [facility])   [place] [facility] Donita Brooks, MD   10 months ago Type 2 diabetes mellitus with other specified complication, with long-term current use of insulin [facility])   [place] [facility] Pickard, Priscille Heidelberg, MD

## 2023-06-16 ENCOUNTER — Other Ambulatory Visit: Payer: Self-pay | Admitting: Family Medicine

## 2023-06-18 NOTE — Telephone Encounter (Signed)
 Requested Prescriptions  Pending Prescriptions Disp Refills   ONETOUCH ULTRA test strip [Pharmacy Med Name: ONE TOUCH ULT 100CT TEST ST IP] 300 each 10    Sig: USE 1 STRIP TO CHECK GLUCOSE FIVE TIMES DAILY     Endocrinology: Diabetes - Testing Supplies Passed - 06/18/2023 11:49 AM      Passed - Valid encounter within last 12 months    Recent Outpatient Visits           1 month ago Upper respiratory infection, viral   [place] [facility] Bernadette Hoit, MD   2 months ago Type 2 diabetes mellitus with other specified complication, with long-term current use of insulin [facility])   [place] [facility] Pickard, Priscille Heidelberg, MD   3 months ago Flu vaccine need   [place] [facility] Donita Brooks, MD   7 months ago Type 2 diabetes mellitus with other specified complication, with long-term current use of insulin [facility])   [place] [facility] Donita Brooks, MD   10 months ago Type 2 diabetes mellitus with other specified complication, with long-term current use of insulin [facility])   [place] [facility] Pickard, Priscille Heidelberg, MD

## 2023-06-22 ENCOUNTER — Other Ambulatory Visit: Payer: Self-pay | Admitting: Cardiology

## 2023-06-22 ENCOUNTER — Other Ambulatory Visit: Payer: Self-pay | Admitting: Family Medicine

## 2023-06-22 DIAGNOSIS — N1832 Chronic kidney disease, stage 3b: Secondary | ICD-10-CM

## 2023-06-22 DIAGNOSIS — N289 Disorder of kidney and ureter, unspecified: Secondary | ICD-10-CM

## 2023-06-22 DIAGNOSIS — E1169 Type 2 diabetes mellitus with other specified complication: Secondary | ICD-10-CM

## 2023-06-22 DIAGNOSIS — I5032 Chronic diastolic (congestive) heart failure: Secondary | ICD-10-CM

## 2023-06-23 NOTE — Telephone Encounter (Signed)
 Prescription refill request for Eliquis received. Indication:afib Last office visit:2/25 Scr:1.54  1/25 Age: 89 Weight:68  kg  Prescription refilled

## 2023-06-23 NOTE — Telephone Encounter (Signed)
 Requested Prescriptions  Refused Prescriptions Disp Refills   ONETOUCH ULTRA test strip [Pharmacy Med Name: ONE TOUCH ULT 100CT TEST ST IP] 300 each 11    Sig: USE 1 STRIP TO CHECK GLUCOSE FIVE TIMES DAILY     Endocrinology: Diabetes - Testing Supplies Passed - 06/23/2023  3:35 PM      Passed - Valid encounter within last 12 months    Recent Outpatient Visits           2 months ago Upper respiratory infection, viral   [place] [facility] Medicine Bernadette Hoit, MD   2 months ago Type 2 diabetes mellitus with other specified complication, with long-term current use of insulin [facility])   [place] [facility] Pickard, Priscille Heidelberg, MD   3 months ago Flu vaccine need   [place] [facility] Donita Brooks, MD   7 months ago Type 2 diabetes mellitus with other specified complication, with long-term current use of insulin [facility])   [place] [facility] Donita Brooks, MD   10 months ago Type 2 diabetes mellitus with other specified complication, with long-term current use of insulin [facility])   [place] [facility] Pickard, Priscille Heidelberg, MD               Continuous Glucose Sensor (FREESTYLE LIBRE 3 SENSOR) MISC [Pharmacy Med Name: FREESTYLE LIBRE 3 SENSOR Miscellaneous] 6 each 11    Sig: USE TO CHECK BLOOD GLUCOSE LEVELS CONTINUOUSLY AND CHANGE EVERY 14 DAYS     Endocrinology: Diabetes - Testing Supplies Passed - 06/23/2023  3:35 PM      Passed - Valid encounter within last 12 months    Recent Outpatient Visits           2 months ago Upper respiratory infection, viral   [place] [facility] Bernadette Hoit, MD   2 months ago Type 2 diabetes mellitus with other specified complication, with long-term current use of insulin [facility])   [place] [facility] Pickard, Priscille Heidelberg, MD   3 months ago Flu vaccine need   [place] [facility] Donita Brooks,  MD   7 months ago Type 2 diabetes mellitus with other specified complication, with long-term current use of insulin [facility])   [place] [facility] Medicine Donita Brooks, MD   10 months ago Type 2 diabetes mellitus with other specified complication, with long-term current use of insulin [facility])    [facility] Pickard, Priscille Heidelberg, MD

## 2023-06-30 ENCOUNTER — Other Ambulatory Visit: Payer: Self-pay | Admitting: Family Medicine

## 2023-06-30 DIAGNOSIS — J302 Other seasonal allergic rhinitis: Secondary | ICD-10-CM

## 2023-07-01 NOTE — Telephone Encounter (Signed)
 Requested Prescriptions  Pending Prescriptions Disp Refills   cetirizine (ZYRTEC) 10 MG tablet [Pharmacy Med Name: Cetirizine HCl 10 MG Oral Tablet] 90 tablet 0    Sig: Take 1 tablet by mouth once daily     Ear, Nose, and Throat:  Antihistamines 2 Failed - 07/01/2023  9:05 AM      Failed - Cr in normal range and within 360 days    Creat  Date Value Ref Range Status  04/17/2023 1.54 (H) 0.60 - 0.95 mg/dL Final         Passed - Valid encounter within last 12 months    Recent Outpatient Visits           2 months ago Upper respiratory infection, viral   [place] [facility] Amadeo June, MD   2 months ago Type 2 diabetes mellitus with other specified complication, with long-term current use of insulin [facility])   [place] [facility] Pickard, Cisco Crest, MD   4 months ago Flu vaccine need   [place] [facility] Austine Lefort, MD   7 months ago Type 2 diabetes mellitus with other specified complication, with long-term current use of insulin [facility])   [place] [facility] Medicine Austine Lefort, MD   10 months ago Type 2 diabetes mellitus with other specified complication, with long-term current use of insulin [facility])   [place] [facility] Pickard, Cisco Crest, MD

## 2023-08-18 ENCOUNTER — Encounter: Payer: Self-pay | Admitting: Family Medicine

## 2023-08-19 ENCOUNTER — Other Ambulatory Visit: Payer: Self-pay

## 2023-08-19 DIAGNOSIS — I10 Essential (primary) hypertension: Secondary | ICD-10-CM

## 2023-08-19 DIAGNOSIS — I5032 Chronic diastolic (congestive) heart failure: Secondary | ICD-10-CM

## 2023-08-19 DIAGNOSIS — N289 Disorder of kidney and ureter, unspecified: Secondary | ICD-10-CM

## 2023-08-19 MED ORDER — POTASSIUM CHLORIDE ER 10 MEQ PO TBCR: 10.0000 meq | EXTENDED_RELEASE_TABLET | Freq: Every day | ORAL | 1 refills | Status: DC

## 2023-08-19 MED ORDER — MAGNESIUM OXIDE 400 MG PO TABS
400.0000 mg | ORAL_TABLET | Freq: Every day | ORAL | 1 refills | Status: DC
Start: 2023-08-19 — End: 2024-02-10

## 2023-08-31 ENCOUNTER — Other Ambulatory Visit: Payer: Self-pay | Admitting: Cardiology

## 2023-08-31 DIAGNOSIS — I10 Essential (primary) hypertension: Secondary | ICD-10-CM

## 2023-09-01 ENCOUNTER — Other Ambulatory Visit: Payer: Self-pay | Admitting: Family Medicine

## 2023-09-01 DIAGNOSIS — Z951 Presence of aortocoronary bypass graft: Secondary | ICD-10-CM

## 2023-10-05 ENCOUNTER — Other Ambulatory Visit: Payer: Self-pay | Admitting: Family Medicine

## 2023-10-05 DIAGNOSIS — J302 Other seasonal allergic rhinitis: Secondary | ICD-10-CM

## 2023-10-06 ENCOUNTER — Other Ambulatory Visit: Payer: Self-pay

## 2023-10-06 DIAGNOSIS — I10 Essential (primary) hypertension: Secondary | ICD-10-CM

## 2023-10-06 MED ORDER — METOPROLOL TARTRATE 50 MG PO TABS: ORAL_TABLET | ORAL | 2 refills | Status: DC

## 2023-10-07 ENCOUNTER — Encounter: Payer: Self-pay | Admitting: Family Medicine

## 2023-10-07 NOTE — Telephone Encounter (Signed)
 Requested Prescriptions  Pending Prescriptions Disp Refills   cetirizine  (ALLERGY RELIEF CETIRIZINE ) 10 MG tablet [Pharmacy Med Name: CETIRIZINE  10MG  TAB 10 Tablet] 90 tablet 0    Sig: TAKE 1 TABLET BY MOUTH DAILY     Ear, Nose, and Throat:  Antihistamines 2 Failed - 10/07/2023  2:07 PM      Failed - Cr in normal range and within 360 days    Creat  Date Value Ref Range Status  04/17/2023 1.54 (H) 0.60 - 0.95 mg/dL Final         Passed - Valid encounter within last 12 months    Recent Outpatient Visits           5 months ago Upper respiratory infection, viral   [place] [facility] Aletha Bene, MD   5 months ago Type 2 diabetes mellitus with other specified complication, with long-term current use of insulin  [facility])   [place] [facility] Pickard, Butler DASEN, MD   7 months ago Flu vaccine need   [place] [facility] Duanne Butler DASEN, MD   11 months ago Type 2 diabetes mellitus with other specified complication, with long-term current use of insulin  [facility])   [place] [facility] Duanne Butler DASEN, MD   1 year ago Type 2 diabetes mellitus with other specified complication, with long-term current use of insulin  [facility])   [place] [facility] Pickard, Butler DASEN, MD

## 2023-10-08 ENCOUNTER — Other Ambulatory Visit: Payer: Self-pay

## 2023-10-08 DIAGNOSIS — R12 Heartburn: Secondary | ICD-10-CM

## 2023-10-08 MED ORDER — FAMOTIDINE 40 MG PO TABS: 40.0000 mg | ORAL_TABLET | Freq: Every day | ORAL | 1 refills | Status: DC

## 2023-11-25 ENCOUNTER — Other Ambulatory Visit: Payer: Self-pay | Admitting: Cardiology

## 2023-11-25 ENCOUNTER — Ambulatory Visit

## 2023-11-25 ENCOUNTER — Other Ambulatory Visit: Payer: Self-pay | Admitting: Family Medicine

## 2023-11-25 DIAGNOSIS — J302 Other seasonal allergic rhinitis: Secondary | ICD-10-CM

## 2023-11-25 DIAGNOSIS — I5032 Chronic diastolic (congestive) heart failure: Secondary | ICD-10-CM

## 2023-11-26 ENCOUNTER — Other Ambulatory Visit: Payer: Self-pay | Admitting: Family Medicine

## 2023-11-26 DIAGNOSIS — N289 Disorder of kidney and ureter, unspecified: Secondary | ICD-10-CM

## 2023-11-26 DIAGNOSIS — E1169 Type 2 diabetes mellitus with other specified complication: Secondary | ICD-10-CM

## 2023-11-26 DIAGNOSIS — N1832 Chronic kidney disease, stage 3b: Secondary | ICD-10-CM

## 2023-11-26 NOTE — Telephone Encounter (Signed)
 Prescription refill request for Eliquis received. Indication:afib Last office visit:2/25 Scr:1.54  1/25 Age: 89 Weight:68  kg  Prescription refilled

## 2023-11-26 NOTE — Telephone Encounter (Unsigned)
 Copied from CRM [id number]. Topic: Clinical - Medication Refill >> Nov 26, 2023  9:32 AM Robinson H wrote: Medication: Continuous Glucose Sensor (FREESTYLE LIBRE Plus 3 SENSOR)  Has the patient contacted their pharmacy? Yes, pharmacy calling Nat (Agent: If no, request that the patient contact the pharmacy for the refill. If patient does not wish to contact the pharmacy document the reason why and proceed with request.) (Agent: If yes, when and what did the pharmacy advise?)  This is the patient's preferred pharmacy:    [facility] [place] - [address] [place] [address] Phone: [phone] Fax: [phone]    Is this the correct pharmacy for this prescription? Yes If no, delete pharmacy and type the correct one.   Has the prescription been filled recently? No  Is the patient out of the medication? Yes, previous style was discontinued  Has the patient been seen for an appointment in the last year OR does the patient have an upcoming appointment? Yes  Can we respond through MyChart? N/A  Agent: Please be advised that Rx refills may take up to 3 business days. We ask that you follow-up with your pharmacy.

## 2023-11-27 MED ORDER — FREESTYLE LIBRE 3 SENSOR MISC: 0 refills | Status: DC

## 2023-11-27 NOTE — Telephone Encounter (Signed)
 Requested Prescriptions  Pending Prescriptions Disp Refills   Continuous Glucose Sensor (FREESTYLE LIBRE 3 SENSOR) MISC 6 each 0    Sig: USE TO CHECK BLOOD GLUCOSE LEVELS CONTINUOUSLY AND CHANGE EVERY 14 DAYS     Endocrinology: Diabetes - Testing Supplies Passed - 11/27/2023  9:04 AM      Passed - Valid encounter within last 12 months    Recent Outpatient Visits           7 months ago Upper respiratory infection, viral   [place] [facility] Aletha Bene, MD   7 months ago Type 2 diabetes mellitus with other specified complication, with long-term current use of insulin  [facility])   [place] [facility] Pickard, Butler DASEN, MD   9 months ago Flu vaccine need   [place] [facility] Duanne Butler DASEN, MD   1 year ago Type 2 diabetes mellitus with other specified complication, with long-term current use of insulin  [facility])   [place] [facility] Duanne Butler DASEN, MD   1 year ago Type 2 diabetes mellitus with other specified complication, with long-term current use of insulin  [facility])   [place] [facility] Pickard, Butler DASEN, MD

## 2023-12-03 ENCOUNTER — Ambulatory Visit (INDEPENDENT_AMBULATORY_CARE_PROVIDER_SITE_OTHER): Admitting: Family Medicine

## 2023-12-03 ENCOUNTER — Encounter: Payer: Self-pay | Admitting: Family Medicine

## 2023-12-03 VITALS — BP 118/62 | HR 94 | Temp 98.2°F | Ht 63.0 in | Wt 153.4 lb

## 2023-12-03 DIAGNOSIS — N904 Leukoplakia of vulva: Secondary | ICD-10-CM

## 2023-12-03 DIAGNOSIS — I4891 Unspecified atrial fibrillation: Secondary | ICD-10-CM | POA: Diagnosis not present

## 2023-12-03 DIAGNOSIS — E1169 Type 2 diabetes mellitus with other specified complication: Secondary | ICD-10-CM

## 2023-12-03 DIAGNOSIS — Z794 Long term (current) use of insulin: Secondary | ICD-10-CM

## 2023-12-03 DIAGNOSIS — S025XXA Fracture of tooth (traumatic), initial encounter for closed fracture: Secondary | ICD-10-CM

## 2023-12-03 DIAGNOSIS — I5032 Chronic diastolic (congestive) heart failure: Secondary | ICD-10-CM | POA: Diagnosis not present

## 2023-12-03 DIAGNOSIS — Z23 Encounter for immunization: Secondary | ICD-10-CM | POA: Diagnosis not present

## 2023-12-03 DIAGNOSIS — N1832 Chronic kidney disease, stage 3b: Secondary | ICD-10-CM

## 2023-12-03 MED ORDER — ESTRADIOL 0.1 MG/GM VA CREA: 1.0000 | TOPICAL_CREAM | Freq: Every day | VAGINAL | 12 refills | Status: AC

## 2023-12-03 MED ORDER — TRIAMCINOLONE ACETONIDE 0.1 % EX CREA: TOPICAL_CREAM | Freq: Two times a day (BID) | CUTANEOUS | 10 refills | Status: AC

## 2023-12-03 MED ORDER — TRAMADOL HCL 50 MG PO TABS: 50.0000 mg | ORAL_TABLET | Freq: Three times a day (TID) | ORAL | 0 refills | Status: AC | PRN

## 2023-12-03 NOTE — Progress Notes (Signed)
 Subjective:    Patient ID: Misty Edwards, female    DOB: [birth year], 89 y.o.   MRN: [id number]   89 year old F with PMH of IDDM-2, A-fib on Eliquis , diastolic CHF, CKD-3B, CAD, HTN and HLD who presents for follow up of her DM2.  Patient is currently on 50 units of insulin  in the morning and 5 units of insulin  in the evenings.  Despite taking only 5 units of insulin  in the evening she still has hypoglycemic episodes frequently in the middle of the night where her sugar will drop below 80 and she wakes up having to eat something.  Typically during the day her blood sugar is 170-200 on average.  Her blood pressure today is adequate at 118/62.  She is due for a flu shot.  She denies any chest pain or shortness of breath or dyspnea on exertion.  She does report itching on the sides of her thighs.  She has several scratch marks over the greater trochanteric bursa area.  She states that this often itches and bothers her at night.  There is no visible rash other than the scratches.  She also complains of dryness and irritation inside the vagina.  She states that it itches. Past Medical History:  Diagnosis Date   Acute biliary pancreatitis 07/2002   thia was in 07/2004:she still has pseudocyst in tail of pancreas measuring 54 x 33 mm    Adrenal adenoma    bilateral   Anxiety    Atrial fibrillation (HCC)    CAD (coronary artery disease)    a. s/p CABG in 08/2014 with LIMA-LAD, SVG-OM and SVG-PDA   CAD (coronary artery disease)    a. s/p CABG in 08/2014 with LIMA-LAD, SVG-OM and SVG-PDA   Chronic pancreatitis (HCC)    Detached retina    Diabetes mellitus (HCC)    Diverticulosis    Heartburn    History of carcinoma in situ of breast 1988   Hyperlipidemia    Hypertension    IBS (irritable bowel syndrome)    Legally blind    Osteoarthritis    Osteopenia    Upper GI bleed September2004   secondary to gastritis   Past Surgical History:  Procedure Laterality Date   CARDIAC CATHETERIZATION N/A  08/24/2014   Procedure: Left Heart Cath and Coronary Angiography;  Surgeon: Alm LELON Clay, MD;  Location[facility] INVASIVE CV LAB;  Service: Cardiovascular;  Laterality: N/A;   COLONOSCOPY  08/15/05   few tiny diverticula at sigmoid colon/external hemorrhoids but no polyps   COLONOSCOPY  01/08/2012   MFM:Rnonwpr diverticulosis. Next colonoscopy in 12/2016   CORONARY ARTERY BYPASS GRAFT N/A 08/24/2014   Procedure: CORONARY ARTERY BYPASS GRAFTING (CABG) x three, using left internal mammary artery and right leg greater saphenous vein harvested endoscopically;  Surgeon: Maude Fleeta Ochoa, MD;  Location: [facility] OR;  Service: Open Heart Surgery;  Laterality: N/A;   ECTOPIC PREGNANCY SURGERY  1950's   ERCP with sphincterotomy  07/2002   ESOPHAGOGASTRODUODENOSCOPY      Gastritis of body.  Otherwise normal   ESOPHAGOGASTRODUODENOSCOPY  01/08/2012   RMR: Few scattered gastric erosions of uncertain significance-status post biopsy. Minimal chronic inflammation, no H.pylori   LAPAROSCOPIC CHOLECYSTECTOMY     MASTECTOMY Right 1980   PANCREATIC PSEUDOCYST DRAINAGE  [id number]   drained percutaneously    right mastectomy     tacking up of her bladder     TEE WITHOUT CARDIOVERSION N/A 08/24/2014   Procedure: TRANSESOPHAGEAL ECHOCARDIOGRAM (TEE);  Surgeon: Maude Fleeta Trigt,  MD;  Location: [facility] OR;  Service: Open Heart Surgery;  Laterality: N/A;   Current Outpatient Medications on File Prior to Visit  Medication Sig Dispense Refill   acetaminophen  (TYLENOL ) 325 MG tablet Take 650 mg by mouth every 6 (six) hours as needed for mild pain (pain score 1-3).     albuterol  (VENTOLIN  HFA) 108 (90 Base) MCG/ACT inhaler Inhale 1-2 puffs into the lungs every 6 (six) hours as needed for wheezing or shortness of breath. 1 each 0   alum & mag hydroxide-simeth (MAALOX/MYLANTA) 200-200-20 MG/5ML suspension Take 30 mLs by mouth every 6 (six) hours as needed for indigestion or heartburn.     apixaban  (ELIQUIS ) 2.5 MG TABS tablet TAKE 1 TABLET BY  MOUTH TWICE DAILY 180 tablet 1   benzonatate  (TESSALON ) 100 MG capsule Take 1 capsule (100 mg total) by mouth 3 (three) times daily as needed for cough. 30 capsule 0   Blood Glucose Monitoring Suppl (BLOOD GLUCOSE SYSTEM PAK) KIT Please dispense as One Touch Ultra. Use as directed to monitor FSBS 4x daily. Dx: E11.65 1 kit 1   calcium  carbonate (TUMS - DOSED IN MG ELEMENTAL CALCIUM ) 500 MG chewable tablet Chew 3 tablets by mouth as needed for heartburn or indigestion.     cetirizine  (ZYRTEC ) 10 MG tablet TAKE 1 TABLET BY MOUTH DAILY 90 tablet 11   Continuous Blood Gluc Receiver (FREESTYLE LIBRE 3 READER) DEVI 1 Device by Does not apply route daily. Use to continuously check blood sugar daily. 1 each 1   Continuous Glucose Sensor (FREESTYLE LIBRE 3 SENSOR) MISC USE TO CHECK BLOOD GLUCOSE LEVELS CONTINUOUSLY AND CHANGE EVERY 14 DAYS 6 each 0   CREON  24000-76000 units CPEP Take 1 capsule (24,000 Units total) by mouth See admin instructions. TAKE 2 CAPSULES BY MOUTH WITH MEALS AND 1 CAP WITH SNACKS x 2 100 capsule 1   famotidine  (PEPCID ) 40 MG tablet Take 1 tablet (40 mg total) by mouth daily. 90 tablet 1   ferrous sulfate  325 (65 FE) MG tablet Take 325 mg by mouth daily with breakfast.     FLUoxetine  (PROZAC ) 20 MG capsule TAKE 1 CAPSULE BY MOUTH ONCE DAILY 90 capsule 10   furosemide  (LASIX ) 20 MG tablet Take 1 tablet (20 mg total) by mouth See admin instructions. Take 40 mg in the morning and 20 mg at 4 pm.     gabapentin  (NEURONTIN ) 100 MG capsule TAKE 1 CAPSULE BY MOUTH 3 TIMES DAILY 270 capsule 1   insulin  aspart protamine - aspart (NOVOLOG  MIX 70/30 RELION) (70-30) 100 UNIT/ML injection 50 units in am, 8 units in pm     Insulin  Syringe-Needle U-100 (BD INSULIN  SYRINGE U/F) 31G X 5/16 0.5 ML MISC USE AS DIRECTED TO ADMINISTER INSULIN  TWICE DAILY 300 each 1   Insulin  Syringes, Disposable, U-100 1 ML MISC Use as directed to administer insulin  daily. 100 each 3   Lancets Misc. MISC Pt has Accu Chek  Aviva Plus - Needs just lancets  Checks BS 4-5 times per day. Disp 3 boxes(90 day Supply)/4 refills Dx code. E11.9 450 each 3   LORazepam  (ATIVAN ) 1 MG tablet TAKE 1/2 TABLET BY MOUTH AT BEDTIME 30 tablet 3   magnesium  oxide (MAG-OX) 400 MG tablet Take 1 tablet (400 mg total) by mouth daily. 90 tablet 1   metoprolol  tartrate (LOPRESSOR ) 50 MG tablet Take 1 tablet by mouth twice daily along with metoprolol  tartrate 25 mg (total 75 mg) 180 tablet 2   Multiple Vitamin (MULTIVITAMIN) capsule Take 1  capsule by mouth every morning.      nystatin  cream (MYCOSTATIN ) Apply 1 Application topically 3 (three) times daily. 30 g 0   ondansetron  (ZOFRAN ) 4 MG tablet Take 1 tablet (4 mg total) by mouth every 8 (eight) hours as needed for nausea or vomiting. 20 tablet 0   OneTouch Delica Lancets 33G MISC CHECK BLOOD SUGAR 4-5 TIMES PER DAY 300 each 0   ONETOUCH ULTRA test strip USE 1 STRIP TO CHECK GLUCOSE FIVE TIMES DAILY 300 each 10   pantoprazole  (PROTONIX ) 40 MG tablet Take 1 tablet (40 mg total) by mouth daily. 90 tablet 3   potassium chloride  (KLOR-CON ) 10 MEQ tablet Take 1 tablet (10 mEq total) by mouth daily. 90 tablet 1   Propylene Glycol-Glycerin (SOOTHE OP) Apply 2 drops to eye 2 (two) times daily.     rosuvastatin  (CRESTOR ) 5 MG tablet TAKE 1 TABLET BY MOUTH EACH EVENING 90 tablet 11   triamcinolone  cream (KENALOG ) 0.1 % APPLY TOPIACLLY TWICE DAILY 60 g 10   No current facility-administered medications on file prior to visit.   Allergies  Allergen Reactions   Calcium -Containing Compounds Nausea Only   Morphine  And Codeine Other (See Comments)    Hallucination   Raloxifene  Itching    Evista - Face and eyes burning   Vitamin D Analogs Nausea Only   Social History   Socioeconomic History   Marital status: Widowed    Spouse name: Not on file   Number of children: 5   Years of education: Not on file   Highest education level: Not on file  Occupational History   Occupation: homemaker  Tobacco  Use   Smoking status: Never   Smokeless tobacco: Never  Vaping Use   Vaping status: Never Used  Substance and Sexual Activity   Alcohol use: No    Alcohol/week: 0.0 standard drinks of alcohol   Drug use: No   Sexual activity: Not Currently    Birth control/protection: Post-menopausal  Other Topics Concern   Not on file  Social History Narrative   Lives in [place].   Social Drivers of Corporate investment banker Strain: Not on file  Food Insecurity: No Food Insecurity (04/09/2023)   Hunger Vital Sign    Worried About Running Out of Food in the Last Year: Never true    Ran Out of Food in the Last Year: Never true  Transportation Needs: No Transportation Needs (04/09/2023)   PRAPARE - Administrator, Civil Service (Medical): No    Lack of Transportation (Non-Medical): No  Physical Activity: Not on file  Stress: Not on file  Social Connections: Unknown (04/09/2023)   Social Connection and Isolation Panel    Frequency of Communication with Friends and Family: More than three times a week    Frequency of Social Gatherings with Friends and Family: More than three times a week    Attends Religious Services: Patient declined    Database administrator or Organizations: Yes    Attends Banker Meetings: 1 to 4 times per year    Marital Status: Widowed  Intimate Partner Violence: Not At Risk (04/09/2023)   Humiliation, Afraid, Rape, and Kick questionnaire    Fear of Current or Ex-Partner: No    Emotionally Abused: No    Physically Abused: No    Sexually Abused: No          Review of Systems  All other systems reviewed and are negative.      Objective:  Physical Exam Constitutional:      General: She is not in acute distress.    Appearance: Normal appearance. She is normal weight. She is not ill-appearing or toxic-appearing.  Cardiovascular:     Rate and Rhythm: Normal rate. Rhythm irregular.     Heart sounds: Normal heart sounds. No murmur  heard.    No friction rub. No gallop.  Pulmonary:     Effort: Pulmonary effort is normal. No respiratory distress.     Breath sounds: No stridor. No wheezing, rhonchi or rales.  Chest:     Chest wall: No tenderness.  Abdominal:     General: Abdomen is flat. Bowel sounds are normal. There is no distension.     Palpations: Abdomen is soft. There is no mass.     Tenderness: There is no abdominal tenderness.  Musculoskeletal:     Right lower leg: No edema.     Left lower leg: No edema.  Skin:    Findings: No erythema or rash.  Neurological:     General: No focal deficit present.     Mental Status: She is alert and oriented to person, place, and time. Mental status is at baseline.     Cranial Nerves: No cranial nerve deficit.     Sensory: No sensory deficit.     Motor: Weakness present.     Coordination: Coordination normal.   Patient has a cracked tooth adjacent to the right lower canine tooth.  This is hurting.  She is applying Tylenol  and Anbesol to this on a nightly basis to try to control the pain however it is keeping her awake Assessment & Plan:  Type 2 diabetes mellitus with other specified complication, with long-term current use of insulin  (HCC) - Plan: Hemoglobin A1c, CBC with Differential/Platelet, Comprehensive metabolic panel with GFR, Microalbumin/Creatinine Ratio, Urine  Stage 3b chronic kidney disease (HCC)  Chronic diastolic heart failure (HCC)  Atrial fibrillation, unspecified type (HCC)  Lichen sclerosus et atrophicus of the vulva - Plan: triamcinolone  cream (KENALOG ) 0.1 %  Closed fracture of tooth, initial encounter - Plan: Ambulatory referral to Dentistry Due to hypoglycemic episodes in the middle of the night, I will discontinue insulin  at night and continue the patient only on 50 units of insulin  in the morning.  Check CBC, CMP, A1c, and urine microalbumin to creatinine ratio.  Her blood pressure today is excellent.  Patient has a history of lichen sclerosis  which I originally treated with triamcinolone  cream.  This has been unsuccessful.  Therefore I am going to try Estrace  cream intravaginally at night to see if this will help with vaginal moisture to help alleviate some of the itching.  She can use triamcinolone  cream for the itching on the skin over the lateral hips bilaterally at night which I believe is eczema or dry skin.  I am going to consult a dentist regarding the tooth fracture.  Meanwhile I will give the patient tramadol  to take for pain.

## 2023-12-03 NOTE — Addendum Note (Signed)
 Addended by: ANGELENA RONAL BRADLEY K on: 12/03/2023 04:09 PM   Modules accepted: Orders

## 2023-12-04 ENCOUNTER — Ambulatory Visit: Payer: Self-pay | Admitting: Family Medicine

## 2023-12-04 LAB — CBC WITH DIFFERENTIAL/PLATELET
Absolute Lymphocytes: 2379 {cells}/uL (ref 850–3900)
Absolute Monocytes: 638 {cells}/uL (ref 200–950)
Basophils Absolute: 30 {cells}/uL (ref 0–200)
Basophils Relative: 0.4 %
Eosinophils Absolute: 68 {cells}/uL (ref 15–500)
Eosinophils Relative: 0.9 %
HCT: 41.2 % (ref 35.0–45.0)
Hemoglobin: 13.3 g/dL (ref 11.7–15.5)
MCH: 29.1 pg (ref 27.0–33.0)
MCHC: 32.3 g/dL (ref 32.0–36.0)
MCV: 90.2 fL (ref 80.0–100.0)
MPV: 12.7 fL — ABNORMAL HIGH (ref 7.5–12.5)
Monocytes Relative: 8.4 %
Neutro Abs: 4484 {cells}/uL (ref 1500–7800)
Neutrophils Relative %: 59 %
Platelets: 154 Thousand/uL (ref 140–400)
RBC: 4.57 Million/uL (ref 3.80–5.10)
RDW: 13.5 % (ref 11.0–15.0)
Total Lymphocyte: 31.3 %
WBC: 7.6 Thousand/uL (ref 3.8–10.8)

## 2023-12-04 LAB — COMPREHENSIVE METABOLIC PANEL WITH GFR
AG Ratio: 1.7 (calc) (ref 1.0–2.5)
ALT: 25 U/L (ref 6–29)
AST: 26 U/L (ref 10–35)
Albumin: 4.7 g/dL (ref 3.6–5.1)
Alkaline phosphatase (APISO): 65 U/L (ref 37–153)
BUN/Creatinine Ratio: 30 (calc) — ABNORMAL HIGH (ref 6–22)
BUN: 46 mg/dL — ABNORMAL HIGH (ref 7–25)
CO2: 31 mmol/L (ref 20–32)
Calcium: 9.8 mg/dL (ref 8.6–10.4)
Chloride: 98 mmol/L (ref 98–110)
Creat: 1.52 mg/dL — ABNORMAL HIGH (ref 0.60–0.95)
Globulin: 2.8 g/dL (ref 1.9–3.7)
Glucose, Bld: 121 mg/dL — ABNORMAL HIGH (ref 65–99)
Potassium: 4.7 mmol/L (ref 3.5–5.3)
Sodium: 138 mmol/L (ref 135–146)
Total Bilirubin: 0.8 mg/dL (ref 0.2–1.2)
Total Protein: 7.5 g/dL (ref 6.1–8.1)
eGFR: 32 mL/min/1.73m2 — ABNORMAL LOW (ref >=60)

## 2023-12-04 LAB — HEMOGLOBIN A1C
Hgb A1c MFr Bld: 7.2 % — ABNORMAL HIGH (ref <5.7)
Mean Plasma Glucose: 160 mg/dL
eAG (mmol/L): 8.9 mmol/L

## 2024-01-26 ENCOUNTER — Encounter: Payer: Self-pay | Admitting: Nurse Practitioner

## 2024-01-26 ENCOUNTER — Encounter: Payer: Self-pay | Admitting: Family Medicine

## 2024-01-26 ENCOUNTER — Ambulatory Visit: Admitting: Nurse Practitioner

## 2024-01-26 VITALS — BP 151/87 | HR 93 | Temp 98.0°F | Ht 63.0 in | Wt 150.0 lb

## 2024-01-26 DIAGNOSIS — R300 Dysuria: Secondary | ICD-10-CM | POA: Diagnosis not present

## 2024-01-26 DIAGNOSIS — N3000 Acute cystitis without hematuria: Secondary | ICD-10-CM

## 2024-01-26 LAB — MICROSCOPIC EXAMINATION
Renal Epithel, UA: NONE SEEN /HPF
Yeast, UA: NONE SEEN

## 2024-01-26 LAB — URINALYSIS, ROUTINE W REFLEX MICROSCOPIC
Bilirubin, UA: NEGATIVE
Glucose, UA: NEGATIVE
Ketones, UA: NEGATIVE
Nitrite, UA: NEGATIVE
Protein,UA: NEGATIVE
Specific Gravity, UA: 1.01 (ref 1.005–1.030)
Urobilinogen, Ur: 0.2 mg/dL (ref 0.2–1.0)
pH, UA: 6 (ref 5.0–7.5)

## 2024-01-26 MED ORDER — DOXYCYCLINE HYCLATE 100 MG PO TABS: 100.0000 mg | ORAL_TABLET | Freq: Two times a day (BID) | ORAL | 0 refills | Status: DC

## 2024-01-26 NOTE — Patient Instructions (Addendum)
 Take medication as prescribe Cotton underwear Take shower not bath Cranberry juice, yogurt Force fluids AZO over the counter X2 days Culture pending RTO prn

## 2024-01-26 NOTE — Progress Notes (Signed)
 Subjective:    Patient ID: Misty Edwards, female    DOB: 1927-03-09, 89 y.o.   MRN: [id number]   Chief Complaint: Dysuria   Dysuria  This is a new problem. The current episode started in the past 7 days. The problem occurs intermittently. The problem has been waxing and waning. The pain is at a severity of 3/10. The pain is mild. She is Not sexually active. There is No history of pyelonephritis. Associated symptoms include frequency, hesitancy and urgency. Pertinent negatives include no chills. Associated symptoms comments: Has been talking a little crazy the last few days. She has tried nothing for the symptoms. The treatment provided mild relief. Her past medical history is significant for recurrent UTIs.    Patient Active Problem List   Diagnosis Date Noted   Upper respiratory infection, viral 04/24/2023   Hyperglycemia due to diabetes mellitus (HCC) 04/09/2023   Hyperkalemia 04/09/2023   Dehydration 04/09/2023   Fall at home, initial encounter 04/09/2023   Scalp hematoma 04/09/2023   Prolonged QT interval 04/09/2023   Mixed hyperlipidemia 04/09/2023   Chronic heart failure with preserved ejection fraction (HFpEF) (HCC) 04/09/2023   COVID-19 virus infection 04/15/2022   Hypomagnesemia 03/13/2022   Acute on chronic congestive heart failure (HCC) 03/11/2022   Coronary artery disease involving coronary bypass graft of native heart without angina pectoris 03/04/2022   Nonrheumatic mitral valve regurgitation 03/03/2022   Acute exacerbation of CHF (congestive heart failure) (HCC) 03/01/2022   Persistent atrial fibrillation with RVR (HCC) 03/01/2022   Exocrine pancreatic insufficiency 10/04/2021   Heartburn 10/04/2021   Acute pulmonary edema (HCC)    Acute respiratory failure (HCC) 09/23/2019   Palliative care by specialist    Advanced care planning/counseling discussion    Goals of care, counseling/discussion    Disorientation    Acute kidney injury superimposed on chronic  kidney disease 09/13/2018   Hyponatremia 09/13/2018   Herpes zoster ophthalmicus 09/03/2018   Right acetabular fracture (HCC) 09/03/2018   Syncope and collapse 09/03/2018   Protein-calorie malnutrition, severe 09/03/2018   Vaginal atrophy 04/29/2018   Angiokeratoma 04/29/2018   PMB (postmenopausal bleeding) 04/29/2018   Acute on chronic diastolic CHF (congestive heart failure) (HCC) 10/23/2016   Acute respiratory failure with hypoxia (HCC) 10/23/2016   Nephrolithiasis 09/27/2016   Hydronephrosis with renal and ureteral calculus obstruction 09/27/2016   Hydronephrosis 09/27/2016   Heme positive stool 09/05/2016   Dyslipidemia 03/07/2015   Type 2 diabetes mellitus with vascular disease (HCC) 03/07/2015   Anemia 09/26/2014   Lesion of lip 09/25/2014   S/P CABG x 3 08/25/2014   NSTEMI (non-ST elevated myocardial infarction) (HCC) 08/24/2014   Acute coronary syndrome (HCC)    IBS (irritable bowel syndrome) 08/26/2012   Thrombocytopenia 02/02/2012   Epigastric pain 02/02/2012   Gastritis 02/02/2012   Normocytic anemia 12/17/2011   History of colon polyps 12/17/2011   Constipation 12/17/2011   Family history of colon cancer 08/22/2011   LLQ pain 08/21/2011   Chronic pancreatitis (HCC) 09/12/2008   HELICOBACTER PYLORI INFECTION 01/26/2008   ADENOCARCINOMA, BREAST 01/26/2008   Essential hypertension 01/26/2008   External hemorrhoids 01/26/2008   GERD 01/26/2008   PSEUDOCYST, PANCREAS 01/26/2008   GALLSTONE PANCREATITIS 01/26/2008   GI BLEEDING 01/26/2008   UTI (urinary tract infection) 01/26/2008   NAUSEA 01/26/2008   VOMITING 01/26/2008   DIARRHEA 01/26/2008        Review of Systems  Constitutional:  Negative for chills and fever.  Gastrointestinal:  Positive for abdominal pain.  Genitourinary:  Positive for dysuria, frequency, hesitancy and urgency.  Musculoskeletal:  Negative for back pain.       Objective:   Physical Exam Vitals reviewed.  Constitutional:       Appearance: Normal appearance.  Cardiovascular:     Rate and Rhythm: Normal rate and regular rhythm.     Heart sounds: Normal heart sounds.  Pulmonary:     Breath sounds: Normal breath sounds.  Abdominal:     Tenderness: There is no abdominal tenderness. There is no right CVA tenderness or left CVA tenderness.  Skin:    General: Skin is warm.  Neurological:     General: No focal deficit present.     Mental Status: She is alert and oriented to person, place, and time.  Psychiatric:        Mood and Affect: Mood normal.        Behavior: Behavior normal.     BP (!) 151/87   Pulse 93   Temp 98 F (36.7 C) (Temporal)   Ht 5' 3 (1.6 m)   Wt 150 lb (68 kg)   SpO2 95%   BMI 26.57 kg/m        Assessment & Plan:   Misty Edwards in today with chief complaint of Dysuria   1. Dysuria (Primary) - Urinalysis, Routine w reflex microscopic - Urine Culture  2. Acute cystitis without hematuria Take medication as prescribe Cotton underwear Take shower not bath Cranberry juice, yogurt Force fluids AZO over the counter X2 days Culture pending RTO prn  - doxycycline (VIBRA-TABS) 100 MG tablet; Take 1 tablet (100 mg total) by mouth 2 (two) times daily.  Dispense: 20 tablet; Refill: 0    The above assessment and management plan was discussed with the patient. The patient verbalized understanding of and has agreed to the management plan. Patient is aware to call the clinic if symptoms persist or worsen. Patient is aware when to return to the clinic for a follow-up visit. Patient educated on when it is appropriate to go to the emergency department.   Mary-Margaret Gladis, FNP

## 2024-01-27 ENCOUNTER — Other Ambulatory Visit: Payer: Self-pay | Admitting: Student

## 2024-01-27 DIAGNOSIS — N289 Disorder of kidney and ureter, unspecified: Secondary | ICD-10-CM

## 2024-01-27 DIAGNOSIS — I5032 Chronic diastolic (congestive) heart failure: Secondary | ICD-10-CM

## 2024-01-27 DIAGNOSIS — I10 Essential (primary) hypertension: Secondary | ICD-10-CM

## 2024-01-28 LAB — URINE CULTURE

## 2024-01-31 ENCOUNTER — Encounter (HOSPITAL_COMMUNITY): Payer: Self-pay | Admitting: Emergency Medicine

## 2024-01-31 ENCOUNTER — Other Ambulatory Visit: Payer: Self-pay

## 2024-01-31 ENCOUNTER — Inpatient Hospital Stay (HOSPITAL_COMMUNITY)
Admission: EM | Admit: 2024-01-31 | Discharge: 2024-02-05 | DRG: 689 | Disposition: A | Discharge status: Home Health | Attending: Internal Medicine | Admitting: Internal Medicine

## 2024-01-31 DIAGNOSIS — B9620 Unspecified Escherichia coli [E. coli] as the cause of diseases classified elsewhere: Secondary | ICD-10-CM | POA: Diagnosis present

## 2024-01-31 DIAGNOSIS — N390 Urinary tract infection, site not specified: Principal | ICD-10-CM | POA: Diagnosis present

## 2024-01-31 DIAGNOSIS — I4891 Unspecified atrial fibrillation: Secondary | ICD-10-CM

## 2024-01-31 DIAGNOSIS — Z8711 Personal history of peptic ulcer disease: Secondary | ICD-10-CM

## 2024-01-31 DIAGNOSIS — Z9011 Acquired absence of right breast and nipple: Secondary | ICD-10-CM

## 2024-01-31 DIAGNOSIS — I4821 Permanent atrial fibrillation: Secondary | ICD-10-CM | POA: Diagnosis present

## 2024-01-31 DIAGNOSIS — H548 Legal blindness, as defined in USA: Secondary | ICD-10-CM | POA: Diagnosis present

## 2024-01-31 DIAGNOSIS — Z79899 Other long term (current) drug therapy: Secondary | ICD-10-CM

## 2024-01-31 DIAGNOSIS — Z800 Family history of malignant neoplasm of digestive organs: Secondary | ICD-10-CM

## 2024-01-31 DIAGNOSIS — R410 Disorientation, unspecified: Secondary | ICD-10-CM

## 2024-01-31 DIAGNOSIS — E785 Hyperlipidemia, unspecified: Secondary | ICD-10-CM | POA: Diagnosis present

## 2024-01-31 DIAGNOSIS — Z951 Presence of aortocoronary bypass graft: Secondary | ICD-10-CM

## 2024-01-31 DIAGNOSIS — Z806 Family history of leukemia: Secondary | ICD-10-CM

## 2024-01-31 DIAGNOSIS — Z86000 Personal history of in-situ neoplasm of breast: Secondary | ICD-10-CM

## 2024-01-31 DIAGNOSIS — G9341 Metabolic encephalopathy: Secondary | ICD-10-CM | POA: Diagnosis present

## 2024-01-31 DIAGNOSIS — I130 Hypertensive heart and chronic kidney disease with heart failure and stage 1 through stage 4 chronic kidney disease, or unspecified chronic kidney disease: Secondary | ICD-10-CM | POA: Diagnosis present

## 2024-01-31 DIAGNOSIS — E1122 Type 2 diabetes mellitus with diabetic chronic kidney disease: Secondary | ICD-10-CM | POA: Diagnosis present

## 2024-01-31 DIAGNOSIS — Z7901 Long term (current) use of anticoagulants: Secondary | ICD-10-CM

## 2024-01-31 DIAGNOSIS — F0394 Unspecified dementia, unspecified severity, with anxiety: Secondary | ICD-10-CM | POA: Diagnosis present

## 2024-01-31 DIAGNOSIS — Z8041 Family history of malignant neoplasm of ovary: Secondary | ICD-10-CM

## 2024-01-31 DIAGNOSIS — I2510 Atherosclerotic heart disease of native coronary artery without angina pectoris: Secondary | ICD-10-CM | POA: Diagnosis present

## 2024-01-31 DIAGNOSIS — Z8249 Family history of ischemic heart disease and other diseases of the circulatory system: Secondary | ICD-10-CM

## 2024-01-31 DIAGNOSIS — Z794 Long term (current) use of insulin: Secondary | ICD-10-CM

## 2024-01-31 DIAGNOSIS — K219 Gastro-esophageal reflux disease without esophagitis: Secondary | ICD-10-CM | POA: Diagnosis present

## 2024-01-31 DIAGNOSIS — N1832 Chronic kidney disease, stage 3b: Secondary | ICD-10-CM | POA: Diagnosis present

## 2024-01-31 LAB — CBC
HCT: 43.9 % (ref 36.0–46.0)
Hemoglobin: 14.2 g/dL (ref 12.0–15.0)
MCH: 29.6 pg (ref 26.0–34.0)
MCHC: 32.3 g/dL (ref 30.0–36.0)
MCV: 91.6 fL (ref 80.0–100.0)
Platelets: 166 K/uL (ref 150–400)
RBC: 4.79 MIL/uL (ref 3.87–5.11)
RDW: 13.6 % (ref 11.5–15.5)
WBC: 7.6 K/uL (ref 4.0–10.5)
nRBC: 0 % (ref 0.0–0.2)

## 2024-01-31 LAB — COMPREHENSIVE METABOLIC PANEL WITH GFR
ALT: 39 U/L (ref 0–44)
AST: 47 U/L — ABNORMAL HIGH (ref 15–41)
Albumin: 4.7 g/dL (ref 3.5–5.0)
Alkaline Phosphatase: 69 U/L (ref 38–126)
Anion gap: 10 (ref 5–15)
BUN: 33 mg/dL — ABNORMAL HIGH (ref 8–23)
CO2: 30 mmol/L (ref 22–32)
Calcium: 9.5 mg/dL (ref 8.9–10.3)
Chloride: 101 mmol/L (ref 98–111)
Creatinine, Ser: 1.38 mg/dL — ABNORMAL HIGH (ref 0.44–1.00)
GFR, Estimated: 35 mL/min — ABNORMAL LOW (ref >60)
Glucose, Bld: 70 mg/dL (ref 70–99)
Potassium: 4.5 mmol/L (ref 3.5–5.1)
Sodium: 142 mmol/L (ref 135–145)
Total Bilirubin: 0.9 mg/dL (ref 0.0–1.2)
Total Protein: 7.6 g/dL (ref 6.5–8.1)

## 2024-01-31 LAB — URINALYSIS, ROUTINE W REFLEX MICROSCOPIC
Bilirubin Urine: NEGATIVE
Glucose, UA: NEGATIVE mg/dL
Hgb urine dipstick: NEGATIVE
Ketones, ur: NEGATIVE mg/dL
Leukocytes,Ua: NEGATIVE
Nitrite: NEGATIVE
Protein, ur: NEGATIVE mg/dL
Specific Gravity, Urine: 1.008 (ref 1.005–1.030)
pH: 6 (ref 5.0–8.0)

## 2024-01-31 LAB — CBG MONITORING, ED
Glucose-Capillary: 148 mg/dL — ABNORMAL HIGH (ref 70–99)
Glucose-Capillary: 75 mg/dL (ref 70–99)
Glucose-Capillary: 65 mg/dL — ABNORMAL LOW (ref 70–99)
Glucose-Capillary: 83 mg/dL (ref 70–99)
Glucose-Capillary: 76 mg/dL (ref 70–99)

## 2024-01-31 LAB — GLUCOSE, CAPILLARY: Glucose-Capillary: 151 mg/dL — ABNORMAL HIGH (ref 70–99)

## 2024-01-31 MED ORDER — ROSUVASTATIN CALCIUM 10 MG PO TABS
5.0000 mg | ORAL_TABLET | Freq: Every day | ORAL | Status: DC
  Administered 2024-01-31 – 2024-02-04 (×5): 5 mg via ORAL
  Filled 2024-01-31 (×5): qty 1

## 2024-01-31 MED ORDER — SODIUM CHLORIDE 0.9 % IV SOLN
1.0000 g | Freq: Once | INTRAVENOUS | Status: AC
  Administered 2024-01-31: 1 g via INTRAVENOUS
  Filled 2024-01-31: qty 10

## 2024-01-31 MED ORDER — APIXABAN 2.5 MG PO TABS
2.5000 mg | ORAL_TABLET | Freq: Two times a day (BID) | ORAL | Status: DC
  Administered 2024-01-31 – 2024-02-05 (×10): 2.5 mg via ORAL
  Filled 2024-01-31 (×10): qty 1

## 2024-01-31 MED ORDER — PANTOPRAZOLE SODIUM 40 MG PO TBEC
40.0000 mg | DELAYED_RELEASE_TABLET | Freq: Every day | ORAL | Status: DC
  Administered 2024-02-01 – 2024-02-05 (×5): 40 mg via ORAL
  Filled 2024-01-31 (×5): qty 1

## 2024-01-31 MED ORDER — METOPROLOL TARTRATE 50 MG PO TABS
50.0000 mg | ORAL_TABLET | Freq: Two times a day (BID) | ORAL | Status: DC
  Administered 2024-01-31 – 2024-02-02 (×4): 50 mg via ORAL
  Filled 2024-01-31 (×5): qty 1

## 2024-01-31 MED ORDER — FUROSEMIDE 20 MG PO TABS
20.0000 mg | ORAL_TABLET | Freq: Every day | ORAL | Status: DC
  Administered 2024-02-01 – 2024-02-04 (×3): 20 mg via ORAL
  Filled 2024-01-31 (×4): qty 1

## 2024-01-31 MED ORDER — ENOXAPARIN SODIUM 30 MG/0.3ML IJ SOSY: 30.0000 mg | PREFILLED_SYRINGE | Freq: Every day | INTRAMUSCULAR | Status: DC

## 2024-01-31 MED ORDER — INSULIN ASPART 100 UNIT/ML IJ SOLN
0.0000 [IU] | Freq: Every day | INTRAMUSCULAR | Status: DC
  Administered 2024-02-01: 2 [IU] via SUBCUTANEOUS
  Filled 2024-01-31: qty 1

## 2024-01-31 MED ORDER — CEPHALEXIN 500 MG PO CAPS: 500.0000 mg | ORAL_CAPSULE | Freq: Two times a day (BID) | ORAL | 0 refills | Status: DC

## 2024-01-31 MED ORDER — SODIUM CHLORIDE 0.9 % IV SOLN
1.0000 g | INTRAVENOUS | Status: DC
  Administered 2024-02-01 – 2024-02-04 (×4): 1 g via INTRAVENOUS
  Filled 2024-01-31 (×4): qty 10

## 2024-01-31 MED ORDER — FAMOTIDINE 20 MG PO TABS
40.0000 mg | ORAL_TABLET | Freq: Every day | ORAL | Status: DC
  Administered 2024-01-31 – 2024-02-01 (×2): 40 mg via ORAL
  Filled 2024-01-31 (×2): qty 2

## 2024-01-31 MED ORDER — FLUOXETINE HCL 20 MG PO CAPS
20.0000 mg | ORAL_CAPSULE | Freq: Every day | ORAL | Status: DC
  Administered 2024-02-01 – 2024-02-05 (×5): 20 mg via ORAL
  Filled 2024-01-31 (×5): qty 1

## 2024-01-31 MED ORDER — FUROSEMIDE 40 MG PO TABS
40.0000 mg | ORAL_TABLET | Freq: Every day | ORAL | Status: DC
  Administered 2024-02-01 – 2024-02-02 (×2): 40 mg via ORAL
  Filled 2024-01-31 (×2): qty 1

## 2024-01-31 MED ORDER — INSULIN ASPART 100 UNIT/ML IJ SOLN
0.0000 [IU] | Freq: Three times a day (TID) | INTRAMUSCULAR | Status: DC
  Administered 2024-02-01: 7 [IU] via SUBCUTANEOUS
  Administered 2024-02-01: 3 [IU] via SUBCUTANEOUS
  Administered 2024-02-01: 9 [IU] via SUBCUTANEOUS
  Administered 2024-02-02: 5 [IU] via SUBCUTANEOUS
  Filled 2024-01-31 (×4): qty 1

## 2024-01-31 NOTE — ED Notes (Signed)
 Pt was able to ambulate with 1 assist to the restroom. Pt uses a walker at baseline.

## 2024-01-31 NOTE — Plan of Care (Signed)

## 2024-01-31 NOTE — Progress Notes (Signed)
 PHARMACIST - PHYSICIAN COMMUNICATION  CONCERNING:  Enoxaparin  (Lovenox ) for DVT Prophylaxis    RECOMMENDATION: Patient was prescribed enoxaprin 40mg  q24 hours for VTE prophylaxis.   Filed Weights   01/31/24 1246  Weight: 66 kg (145 lb 8 oz)    Body mass index is 25.77 kg/m.  Estimated Creatinine Clearance: 22.3 mL/min (A) (by C-G formula based on SCr of 1.38 mg/dL (H)).  Patient is candidate for enoxaparin  30mg  every 24 hours based on CrCl <70ml/min or Weight <45kg  DESCRIPTION: Pharmacy has adjusted enoxaparin  dose per [facility] policy.  Patient is now receiving enoxaparin  30 mg every 24 hours    Annabella LOISE Banks, PharmD Clinical Pharmacist  01/31/2024 9:35 PM

## 2024-01-31 NOTE — ED Triage Notes (Signed)
 Pt arrived with family with c/o pt has been on Doxycycline since the 11th for a UTI. Family believes pt has not gotten any better. Pt is restless, seeing lights, walking down the hall with her eyes closed, and not sleeping well. Pt states she is on blood thinners .

## 2024-01-31 NOTE — ED Notes (Signed)
 Pt was able to pass the PO trial with water , crackers and peanut butter.

## 2024-01-31 NOTE — ED Provider Notes (Addendum)
 [place] [facility] AT [facility] Provider Note   CSN: [id number] Arrival date & time: 01/31/24  1225     Patient presents with: Altered Mental Status   URIYAH Edwards is a 89 y.o. female.   89 year old female history of CAD status post CABG, HTN, HLD, and UTI who presents emergency department with confusion.  History obtained per the patient and her son.  They report that over the weekend she started seeing that she was confused and saying bizarre things. Nothing made sense. Delusional and no hx of dementia. Saw [facility] [employer] dx of uti started on doxycyline.  Is growing pansensitive E. coli.  Has been complaining of dysuria. Has been having generalized weakness. Lives at home with family. No fevers, chills, flank pain.        Prior to Admission medications   Medication Sig Start Date End Date Taking? Authorizing Provider  cephALEXin  (KEFLEX ) 500 MG capsule Take 1 capsule (500 mg total) by mouth 2 (two) times daily for 7 days. 01/31/24 02/07/24 Yes Yolande Lamar BROCKS, MD  acetaminophen  (TYLENOL ) 325 MG tablet Take 650 mg by mouth every 6 (six) hours as needed for mild pain (pain score 1-3).    [provider]  albuterol  (VENTOLIN  HFA) 108 (90 Base) MCG/ACT inhaler Inhale 1-2 puffs into the lungs every 6 (six) hours as needed for wheezing or shortness of breath. 04/24/23   Aletha Bene, MD  alum & mag hydroxide-simeth (MAALOX/MYLANTA) 200-200-20 MG/5ML suspension Take 30 mLs by mouth every 6 (six) hours as needed for indigestion or heartburn.    [provider]  apixaban  (ELIQUIS ) 2.5 MG TABS tablet TAKE 1 TABLET BY MOUTH TWICE DAILY 11/26/23   Alvan Dorn FALCON, MD  benzonatate  (TESSALON ) 100 MG capsule Take 1 capsule (100 mg total) by mouth 3 (three) times daily as needed for cough. Patient not taking: Reported on 01/26/2024 04/24/23   Aletha Bene, MD  Blood Glucose Monitoring Suppl (BLOOD GLUCOSE SYSTEM PAK) KIT Please  dispense as One Touch Ultra. Use as directed to monitor FSBS 4x daily. Dx: E11.65 11/16/20   Duanne Butler DASEN, MD  calcium  carbonate (TUMS - DOSED IN MG ELEMENTAL CALCIUM ) 500 MG chewable tablet Chew 3 tablets by mouth as needed for heartburn or indigestion.    [provider]  cetirizine  (ZYRTEC ) 10 MG tablet TAKE 1 TABLET BY MOUTH DAILY 11/26/23   Duanne Butler DASEN, MD  Continuous Blood Gluc Receiver (FREESTYLE LIBRE 3 READER) DEVI 1 Device by Does not apply route daily. Use to continuously check blood sugar daily. 05/16/22   Duanne Butler DASEN, MD  Continuous Glucose Sensor (FREESTYLE LIBRE 3 SENSOR) MISC USE TO CHECK BLOOD GLUCOSE LEVELS CONTINUOUSLY AND CHANGE EVERY 14 DAYS 11/27/23   Duanne Butler DASEN, MD  CREON  24000-76000 units CPEP Take 1 capsule (24,000 Units total) by mouth See admin instructions. TAKE 2 CAPSULES BY MOUTH WITH MEALS AND 1 CAP WITH SNACKS x 2 06/15/23   Duanne Butler DASEN, MD  estradiol  (ESTRACE  VAGINAL) 0.1 MG/GM vaginal cream Place 1 Applicatorful vaginally at bedtime. 12/03/23   Duanne Butler DASEN, MD  famotidine  (PEPCID ) 40 MG tablet Take 1 tablet (40 mg total) by mouth daily. 10/08/23   Duanne Butler DASEN, MD  ferrous sulfate  325 (65 FE) MG tablet Take 325 mg by mouth daily with breakfast.    [provider]  FLUoxetine  (PROZAC ) 20 MG capsule TAKE 1 CAPSULE BY MOUTH ONCE DAILY 06/09/23   Duanne Butler DASEN, MD  furosemide  (  LASIX ) 20 MG tablet Take 1 tablet (20 mg total) by mouth See admin instructions. Take 40 mg in the morning and 20 mg at 4 pm. 04/24/23   Duanne Butler DASEN, MD  gabapentin  (NEURONTIN ) 100 MG capsule TAKE 1 CAPSULE BY MOUTH 3 TIMES DAILY 05/29/23   Duanne Butler DASEN, MD  insulin  aspart protamine - aspart (NOVOLOG  MIX 70/30 RELION) (70-30) 100 UNIT/ML injection 50 units in am, 8 units in pm Patient taking differently: Inject into the skin 2 (two) times daily with a meal. 50 units in am, 4 units at night. 04/17/23   Duanne Butler DASEN, MD  Insulin   Syringe-Needle U-100 (BD INSULIN  SYRINGE U/F) 31G X 5/16 0.5 ML MISC USE AS DIRECTED TO ADMINISTER INSULIN  TWICE DAILY 04/23/23   Duanne Butler DASEN, MD  Insulin  Syringes, Disposable, U-100 1 ML MISC Use as directed to administer insulin  daily. 11/18/22   Duanne Butler DASEN, MD  Lancets Misc. MISC Pt has Accu Chek Aviva Plus - Needs just lancets  Checks BS 4-5 times per day. Disp 3 boxes(90 day Supply)/4 refills Dx code. E11.9 10/13/19   Duanne Butler DASEN, MD  LORazepam  (ATIVAN ) 1 MG tablet TAKE 1/2 TABLET BY MOUTH AT BEDTIME 09/03/23   Duanne Butler DASEN, MD  magnesium  oxide (MAG-OX) 400 MG tablet Take 1 tablet (400 mg total) by mouth daily. 08/19/23   Duanne Butler DASEN, MD  metoprolol  tartrate (LOPRESSOR ) 50 MG tablet Take 1 tablet by mouth twice daily along with metoprolol  tartrate 25 mg (total 75 mg) 10/06/23   Strader, Laymon HERO, PA-C  Multiple Vitamin (MULTIVITAMIN) capsule Take 1 capsule by mouth every morning.     [provider]  nystatin  cream (MYCOSTATIN ) Apply 1 Application topically 3 (three) times daily. 04/17/23   Duanne Butler DASEN, MD  ondansetron  (ZOFRAN ) 4 MG tablet Take 1 tablet (4 mg total) by mouth every 8 (eight) hours as needed for nausea or vomiting. 04/23/23   Duanne Butler DASEN, MD  OneTouch Delica Lancets 33G MISC CHECK BLOOD SUGAR 4-5 TIMES PER DAY 10/22/20   Duanne Butler DASEN, MD  [facility] ULTRA test strip USE 1 STRIP TO CHECK GLUCOSE FIVE TIMES DAILY 06/18/23   Duanne Butler DASEN, MD  pantoprazole  (PROTONIX ) 40 MG tablet Take 1 tablet (40 mg total) by mouth daily. 04/23/23   Duanne Butler DASEN, MD  potassium chloride  (KLOR-CON ) 10 MEQ tablet TAKE 1 TABLET BY MOUTH DAILY 01/29/24   Strader, Brittany M, PA-C  Propylene Glycol-Glycerin (SOOTHE OP) Apply 2 drops to eye 2 (two) times daily.    [provider]  rosuvastatin  (CRESTOR ) 5 MG tablet TAKE 1 TABLET BY MOUTH EACH EVENING 09/03/23   Duanne Butler DASEN, MD  triamcinolone  cream (KENALOG ) 0.1 % Apply topically 2 (two) times  daily. 12/03/23   Duanne Butler DASEN, MD    Allergies: Morphine  and codeine, Calcium -containing compounds, Raloxifene , and Vitamin d analogs    Review of Systems  Updated Vital Signs BP (!) 122/90 (BP Location: Right Arm)   Pulse 84   Temp 97.6 F (36.4 C)   Resp 17   Ht 5' 3 (1.6 m)   Wt 66 kg   SpO2 95%   BMI 25.77 kg/m   Physical Exam Vitals and nursing note reviewed.  Constitutional:      General: She is not in acute distress.    Appearance: She is well-developed.     Comments: Resting comfortably but arouses to voice  HENT:     Head: Normocephalic and atraumatic.  Right Ear: External ear normal.     Left Ear: External ear normal.     Nose: Nose normal.  Eyes:     Extraocular Movements: Extraocular movements intact.     Conjunctiva/sclera: Conjunctivae normal.     Pupils: Pupils are equal, round, and reactive to light.  Cardiovascular:     Rate and Rhythm: Normal rate and regular rhythm.     Heart sounds: No murmur heard. Pulmonary:     Effort: Pulmonary effort is normal. No respiratory distress.     Breath sounds: Normal breath sounds.  Abdominal:     General: Abdomen is flat. There is no distension.     Palpations: Abdomen is soft. There is no mass.     Tenderness: There is no abdominal tenderness. There is no right CVA tenderness, left CVA tenderness or guarding.  Musculoskeletal:     Cervical back: Normal range of motion and neck supple.     Right lower leg: No edema.     Left lower leg: No edema.  Skin:    General: Skin is warm and dry.  Neurological:     Mental Status: She is oriented to person, place, and time. Mental status is at baseline.     Cranial Nerves: No cranial nerve deficit.     Sensory: No sensory deficit.     Motor: No weakness.  Psychiatric:        Mood and Affect: Mood normal.     (all labs ordered are listed, but only abnormal results are displayed) Labs Reviewed  COMPREHENSIVE METABOLIC PANEL WITH GFR - Abnormal; Notable for  the following components:      Result Value   BUN 33 (*)    Creatinine, Ser 1.38 (*)    AST 47 (*)    GFR, Estimated 35 (*)    All other components within normal limits  CBG MONITORING, ED - Abnormal; Notable for the following components:   Glucose-Capillary 148 (*)    All other components within normal limits  CBG MONITORING, ED - Abnormal; Notable for the following components:   Glucose-Capillary 65 (*)    All other components within normal limits  CBC  URINALYSIS, ROUTINE W REFLEX MICROSCOPIC  CBG MONITORING, ED  CBG MONITORING, ED    EKG: EKG Interpretation Date/Time:  Sunday January 31 2024 13:17:48 EST Ventricular Rate:  82 PR Interval:    QRS Duration:  89 QT Interval:  414 QTC Calculation: 484 R Axis:   38  Text Interpretation: Atrial fibrillation LVH with secondary repolarization abnormality Anterior infarct, old Minimal ST elevation, inferior leads Confirmed by Yolande Charleston [phone]) on 01/31/2024 3:37:04 PM  Radiology: No results found.   SABRAUltrasound ED Peripheral IV (Provider)  Date/Time: 01/31/2024 3:07 PM  Performed by: Yolande Charleston BROCKS, MD Authorized by: Yolande Charleston BROCKS, MD   Procedure details:    Indications: multiple failed IV attempts     Skin Prep: chlorhexidine  gluconate     Location:  Left AC   Angiocath:  20 G   Bedside Ultrasound Guided: Yes     Images: not archived     Patient tolerated procedure without complications: Yes     Dressing applied: Yes      Medications Ordered in the ED  cefTRIAXone  (ROCEPHIN ) 1 g in sodium chloride  0.9 % 100 mL IVPB (0 g Intravenous Stopped 01/31/24 1616)    Clinical Course as of 01/31/24 1624  Sun Jan 31, 2024  1618 Signed out to Dr Towana [RP]  1621 CreatinineROLLEN):  1.38 At baseline [RP]    Clinical Course User Index [RP] Yolande Lamar BROCKS, MD                                 Medical Decision Making Amount and/or Complexity of Data Reviewed Labs: ordered. Decision-making details  documented in ED Course.  Risk Prescription drug management.   Misty Edwards is a 89 year old female history of CAD status post CABG, HTN, HLD, and UTI who presents emergency department with confusion.  Initial Ddx:  Urinary tract infection, pyelonephritis, sepsis, confusion, medication side effect, stroke  MDM/Course:  Patient presents emergency department with dysuria and confusion.  Had urine culture on 11/11 that is growing pansensitive E. coli.  Was started on doxycycline but has had persistent symptoms.  On exam no focal neurologic deficits.  She is actually alert and oriented x 3 for me.  She has reassuring vital signs.  No focal neurologic deficits to suggest a stroke.  Lab work was drawn which did not show an elevated white blood cell count.  Does have CKD but it is at its baseline.  Given her urine culture will give her ceftriaxone  at this point in time.  Will send repeat urinalysis and urine culture today as well.  Upon re-evaluation remained stable.  Signed out to the oncoming physician awaiting repeat evaluation to ensure that she is safe to go home but feel that she can likely be treated with Keflex  and PCP follow-up  This patient presents to the ED for concern of complaints listed in HPI, this involves an extensive number of treatment options, and is a complaint that carries with it a high risk of complications and morbidity. Disposition including potential need for admission considered.   Dispo: Pending remainder of workup  Additional history obtained from son Records reviewed Outpatient Clinic Notes The following labs were independently interpreted: Chemistry and show CKD I have reviewed the patients home medications and made adjustments as needed Social Determinants of health:  Geriatric  Portions of this note were generated with Scientist, clinical (histocompatibility and immunogenetics). Dictation errors may occur despite best attempts at proofreading.     Final diagnoses:  Lower urinary tract  infectious disease  Confusion    ED Discharge Orders          Ordered    cephALEXin  (KEFLEX ) 500 MG capsule  2 times daily        01/31/24 1619              Yolande Lamar BROCKS, MD 01/31/24 1624

## 2024-01-31 NOTE — ED Provider Notes (Signed)
 Signout from Dr. Jakie.  89 year old female with increased confusion.  She is on doxycycline for UTI.  Urine culture showing E. coli.  She has received IV antibiotics.  Plan is to ambulate and p.o. trial.  If she is feeling better and family is comfortable she can be discharged on oral antibiotics.  Otherwise would need admission. Physical Exam  BP (!) 153/68   Pulse 95   Temp 97.6 F (36.4 C)   Resp 20   Ht 5' 3 (1.6 m)   Wt 66 kg   SpO2 96%   BMI 25.77 kg/m   Physical Exam  Procedures  Procedures  ED Course / MDM   Clinical Course as of 01/31/24 1645  Sun Jan 31, 2024  1618 Signed out to Dr Towana [RP]  1621 Creatinine(!): 1.38 At baseline [RP]    Clinical Course User Index [RP] Yolande Lamar BROCKS, MD   Medical Decision Making Amount and/or Complexity of Data Reviewed Labs: ordered. Decision-making details documented in ED Course.  Risk Prescription drug management.   Nurse said that patient took p.o. and was able to ambulate with a walker.  I reviewed with patient's son, he said he would rather her be in the hospital because she is on blood thinners and confused and he is afraid she may fall.       Towana Ozell BROCKS, MD 02/01/24 [phone]

## 2024-01-31 NOTE — Discharge Instructions (Signed)
 You were seen for your urinary tract infection in the emergency department.   At home, please take your antibiotics (keflex ).    Check your MyChart online for the results of any tests that had not resulted by the time you left the emergency department.   Follow-up with your primary doctor in 2-3 days regarding your visit.    Return immediately to the emergency department if you experience any of the following: Fevers, flank pain, worsening confusion, or any other concerning symptoms.    Thank you for visiting our Emergency Department. It was a pleasure taking care of you today.

## 2024-01-31 NOTE — H&P (Signed)
 History and Physical    Patient: Misty Edwards FMW:[id number] DOB: [birth year] DOA: 01/31/2024 DOS: the patient was seen and examined on 01/31/2024 PCP: Duanne Butler DASEN, MD  Patient coming from: Home  Chief Complaint: Generalized weakness and confusion Chief Complaint  Patient presents with   Altered Mental Status   HPI: Misty Edwards is a 89 y.o. female with medical history significant of coronary artery disease status post CABG, type 2 diabetes mellitus, hyperlipidemia, hypertension, IBS, anxiety who was otherwise well until the last 1 week when she started having generalized weakness, some intermittent confusion as well as dysuria.  Patient followed up with her PCP and was prescribed doxycycline however according to patient's grandson they have not seen any significant improvement in her confusion and therefore came to the emergency room for further management.  Patient denied chest pain, nausea, vomiting, abdominal pain, cough or headache  ED course Upon arrival to the emergency room.  Had temperature 97.6, respiratory rate 17, pulse 84, blood pressure 122/90 saturating 95% on room air.  Urinalysis not convincing for UTI, Patient granddaughter at bedside close and dad is unable to take patient home as he is worried that patient may fall and bleed into the brain on blood thinner. Hospitalist service was therefore contacted to admit patient for further management  Review of Systems: As mentioned in the history of present illness. All other systems reviewed and are negative. Past Medical History:  Diagnosis Date   Acute biliary pancreatitis 07/2002   thia was in 07/2004:she still has pseudocyst in tail of pancreas measuring 54 x 33 mm    Adrenal adenoma    bilateral   Anxiety    Atrial fibrillation (HCC)    CAD (coronary artery disease)    a. s/p CABG in 08/2014 with LIMA-LAD, SVG-OM and SVG-PDA   CAD (coronary artery disease)    a. s/p CABG in 08/2014 with LIMA-LAD, SVG-OM and  SVG-PDA   Chronic pancreatitis (HCC)    Detached retina    Diabetes mellitus (HCC)    Diverticulosis    Heartburn    History of carcinoma in situ of breast 1988   Hyperlipidemia    Hypertension    IBS (irritable bowel syndrome)    Legally blind    Osteoarthritis    Osteopenia    Upper GI bleed September2004   secondary to gastritis   Past Surgical History:  Procedure Laterality Date   CARDIAC CATHETERIZATION N/A 08/24/2014   Procedure: Left Heart Cath and Coronary Angiography;  Surgeon: Alm LELON Clay, MD;  Location: [facility] INVASIVE CV LAB;  Service: Cardiovascular;  Laterality: N/A;   COLONOSCOPY  08/15/05   few tiny diverticula at sigmoid colon/external hemorrhoids but no polyps   COLONOSCOPY  01/08/2012   MFM:Rnonwpr diverticulosis. Next colonoscopy in 12/2016   CORONARY ARTERY BYPASS GRAFT N/A 08/24/2014   Procedure: CORONARY ARTERY BYPASS GRAFTING (CABG) x three, using left internal mammary artery and right leg greater saphenous vein harvested endoscopically;  Surgeon: Maude Fleeta Ochoa, MD;  Location: [facility] OR;  Service: Open Heart Surgery;  Laterality: N/A;   ECTOPIC PREGNANCY SURGERY  1950's   ERCP with sphincterotomy  07/2002   ESOPHAGOGASTRODUODENOSCOPY      Gastritis of body.  Otherwise normal   ESOPHAGOGASTRODUODENOSCOPY  01/08/2012   RMR: Few scattered gastric erosions of uncertain significance-status post biopsy. Minimal chronic inflammation, no H.pylori   LAPAROSCOPIC CHOLECYSTECTOMY     MASTECTOMY Right 1980   PANCREATIC PSEUDOCYST DRAINAGE  [id number]   drained percutaneously  right mastectomy     tacking up of her bladder     TEE WITHOUT CARDIOVERSION N/A 08/24/2014   Procedure: TRANSESOPHAGEAL ECHOCARDIOGRAM (TEE);  Surgeon: Maude Fleeta Ochoa, MD;  Location: [facility] OR;  Service: Open Heart Surgery;  Laterality: N/A;   Social History:  reports that she has never smoked. She has never used smokeless tobacco. She reports that she does not drink alcohol and does not use  drugs.  Allergies  Allergen Reactions   Morphine  And Codeine Other (See Comments)    Hallucination   Calcium -Containing Compounds Nausea Only   Raloxifene  Itching    Evista - Face and eyes burning   Vitamin D Analogs Nausea Only    Family History  Problem Relation Age of Onset   Ovarian cancer Mother        passed   Colon cancer Father 43       passed away in his 8's   Colon cancer Brother 60       surgery inhis 50's doing well now   Heart attack Brother        passed away age 43   Pancreatitis Brother    Kidney disease Daughter    Leukemia Son     Prior to Admission medications   Medication Sig Start Date End Date Taking? Authorizing Provider  cephALEXin  (KEFLEX ) 500 MG capsule Take 1 capsule (500 mg total) by mouth 2 (two) times daily for 7 days. 01/31/24 02/07/24 Yes Yolande Lamar BROCKS, MD  acetaminophen  (TYLENOL ) 325 MG tablet Take 650 mg by mouth every 6 (six) hours as needed for mild pain (pain score 1-3).    [provider]  albuterol  (VENTOLIN  HFA) 108 (90 Base) MCG/ACT inhaler Inhale 1-2 puffs into the lungs every 6 (six) hours as needed for wheezing or shortness of breath. 04/24/23   Aletha Bene, MD  alum & mag hydroxide-simeth (MAALOX/MYLANTA) 200-200-20 MG/5ML suspension Take 30 mLs by mouth every 6 (six) hours as needed for indigestion or heartburn.    [provider]  apixaban  (ELIQUIS ) 2.5 MG TABS tablet TAKE 1 TABLET BY MOUTH TWICE DAILY 11/26/23   Alvan Dorn FALCON, MD  benzonatate  (TESSALON ) 100 MG capsule Take 1 capsule (100 mg total) by mouth 3 (three) times daily as needed for cough. Patient not taking: Reported on 01/26/2024 04/24/23   Aletha Bene, MD  Blood Glucose Monitoring Suppl (BLOOD GLUCOSE SYSTEM PAK) KIT Please dispense as One Touch Ultra. Use as directed to monitor FSBS 4x daily. Dx: E11.65 11/16/20   Duanne Butler DASEN, MD  calcium  carbonate (TUMS - DOSED IN MG ELEMENTAL CALCIUM ) 500 MG chewable tablet Chew 3 tablets by mouth as  needed for heartburn or indigestion.    [provider]  cetirizine  (ZYRTEC ) 10 MG tablet TAKE 1 TABLET BY MOUTH DAILY 11/26/23   Duanne Butler DASEN, MD  Continuous Blood Gluc Receiver (FREESTYLE LIBRE 3 READER) DEVI 1 Device by Does not apply route daily. Use to continuously check blood sugar daily. 05/16/22   Duanne Butler DASEN, MD  Continuous Glucose Sensor (FREESTYLE LIBRE 3 SENSOR) MISC USE TO CHECK BLOOD GLUCOSE LEVELS CONTINUOUSLY AND CHANGE EVERY 14 DAYS 11/27/23   Duanne Butler DASEN, MD  CREON  24000-76000 units CPEP Take 1 capsule (24,000 Units total) by mouth See admin instructions. TAKE 2 CAPSULES BY MOUTH WITH MEALS AND 1 CAP WITH SNACKS x 2 06/15/23   Duanne Butler DASEN, MD  estradiol  (ESTRACE  VAGINAL) 0.1 MG/GM vaginal cream Place 1 Applicatorful vaginally at bedtime. 12/03/23   Pickard,  Butler DASEN, MD  famotidine  (PEPCID ) 40 MG tablet Take 1 tablet (40 mg total) by mouth daily. 10/08/23   Duanne Butler DASEN, MD  ferrous sulfate  325 (65 FE) MG tablet Take 325 mg by mouth daily with breakfast.    [provider]  FLUoxetine  (PROZAC ) 20 MG capsule TAKE 1 CAPSULE BY MOUTH ONCE DAILY 06/09/23   Duanne Butler DASEN, MD  furosemide  (LASIX ) 20 MG tablet Take 1 tablet (20 mg total) by mouth See admin instructions. Take 40 mg in the morning and 20 mg at 4 pm. 04/24/23   Duanne Butler DASEN, MD  gabapentin  (NEURONTIN ) 100 MG capsule TAKE 1 CAPSULE BY MOUTH 3 TIMES DAILY 05/29/23   Duanne Butler DASEN, MD  insulin  aspart protamine - aspart (NOVOLOG  MIX 70/30 RELION) (70-30) 100 UNIT/ML injection 50 units in am, 8 units in pm Patient taking differently: Inject into the skin 2 (two) times daily with a meal. 50 units in am, 4 units at night. 04/17/23   Duanne Butler DASEN, MD  Insulin  Syringe-Needle U-100 (BD INSULIN  SYRINGE U/F) 31G X 5/16 0.5 ML MISC USE AS DIRECTED TO ADMINISTER INSULIN  TWICE DAILY 04/23/23   Duanne Butler DASEN, MD  Insulin  Syringes, Disposable, U-100 1 ML MISC Use as directed to administer  insulin  daily. 11/18/22   Duanne Butler DASEN, MD  Lancets Misc. MISC Pt has Accu Chek Aviva Plus - Needs just lancets  Checks BS 4-5 times per day. Disp 3 boxes(90 day Supply)/4 refills Dx code. E11.9 10/13/19   Duanne Butler DASEN, MD  LORazepam  (ATIVAN ) 1 MG tablet TAKE 1/2 TABLET BY MOUTH AT BEDTIME 09/03/23   Duanne Butler DASEN, MD  magnesium  oxide (MAG-OX) 400 MG tablet Take 1 tablet (400 mg total) by mouth daily. 08/19/23   Duanne Butler DASEN, MD  metoprolol  tartrate (LOPRESSOR ) 50 MG tablet Take 1 tablet by mouth twice daily along with metoprolol  tartrate 25 mg (total 75 mg) 10/06/23   Strader, Brittany M, PA-C  Multiple Vitamin (MULTIVITAMIN) capsule Take 1 capsule by mouth every morning.     [provider]  nystatin  cream (MYCOSTATIN ) Apply 1 Application topically 3 (three) times daily. 04/17/23   Duanne Butler DASEN, MD  ondansetron  (ZOFRAN ) 4 MG tablet Take 1 tablet (4 mg total) by mouth every 8 (eight) hours as needed for nausea or vomiting. 04/23/23   Duanne Butler DASEN, MD  OneTouch Delica Lancets 33G MISC CHECK BLOOD SUGAR 4-5 TIMES PER DAY 10/22/20   Duanne Butler DASEN, MD  [facility] ULTRA test strip USE 1 STRIP TO CHECK GLUCOSE FIVE TIMES DAILY 06/18/23   Duanne Butler DASEN, MD  pantoprazole  (PROTONIX ) 40 MG tablet Take 1 tablet (40 mg total) by mouth daily. 04/23/23   Duanne Butler DASEN, MD  potassium chloride  (KLOR-CON ) 10 MEQ tablet TAKE 1 TABLET BY MOUTH DAILY 01/29/24   Strader, Brittany M, PA-C  Propylene Glycol-Glycerin (SOOTHE OP) Apply 2 drops to eye 2 (two) times daily.    [provider]  rosuvastatin  (CRESTOR ) 5 MG tablet TAKE 1 TABLET BY MOUTH EACH EVENING 09/03/23   Duanne Butler DASEN, MD  triamcinolone  cream (KENALOG ) 0.1 % Apply topically 2 (two) times daily. 12/03/23   Duanne Butler DASEN, MD    Physical Exam: Vitals:   01/31/24 1245 01/31/24 1246 01/31/24 1616 01/31/24 1630  BP: (!) 122/90  (!) 159/91 (!) 153/68  Pulse: 84  68 95  Resp: 17   20  Temp: 97.6 F (36.4 C)      SpO2: 95%  92% 96%  Weight:  66 kg    Height:  5' 3 (1.6 m)     General: Elderly female laying in bed in no acute distress HEENT: No abnormality detected Respiratory system: Clear to auscultation bilaterally Cardiovascular system: S1-S2 present  Abdominal system: Nontender Musculoskeletal: Moving all extremities equally CNS: Alert and oriented x 2, no lateralizing signs Psych: Normal mood  Data Reviewed:     Latest Ref Rng & Units 01/31/2024    2:56 PM 12/03/2023    2:23 PM 04/17/2023    3:21 PM  CBC  WBC 4.0 - 10.5 K/uL 7.6  7.6  5.9   Hemoglobin 12.0 - 15.0 g/dL 85.7  86.6  87.5   Hematocrit 36.0 - 46.0 % 43.9  41.2  39.1   Platelets 150 - 400 K/uL 166  154  151        Latest Ref Rng & Units 01/31/2024    2:56 PM 12/03/2023    2:23 PM 04/17/2023    3:21 PM  BMP  Glucose 70 - 99 mg/dL 70  878  845   BUN 8 - 23 mg/dL 33  46  43   Creatinine 0.44 - 1.00 mg/dL 8.61  8.47  8.45   BUN/Creat Ratio 6 - 22 (calc)  30  28   Sodium 135 - 145 mmol/L 142  138  138   Potassium 3.5 - 5.1 mmol/L 4.5  4.7  4.6   Chloride 98 - 111 mmol/L 101  98  100   CO2 22 - 32 mmol/L 30  31  28    Calcium  8.9 - 10.3 mg/dL 9.5  9.8  9.2      Assessment and Plan:  Acute metabolic encephalopathy secondary to Partially treated urinary tract infection Urinalysis looking 5 days ago showed possible UTI however urinalysis still came today in the emergency room was not concerning for UTI.  This could be confounding given recent antibiotic use We will continue treatment with ceftriaxone  Continue to monitor mental status closely PT OT consulted as patient grandson is worried about possible fall   Coronary artery disease status post CABG  Continue statin therapy and metoprolol   Type 2 diabetes mellitus Continue to monitor glucose level Continue sliding scale insulin  while inpatient  Hyperlipidemia Continue Crestor   Atrial fibrillation on Eliquis  Continue Eliquis  and  metoprolol   Hypertension Congestive heart failure Continue Lasix  and metoprolol   GERD Continue pantoprazole   Anxiety-continue fluoxetine    Advance Care Planning:   Code Status: Prior full code  Consults: None  Family Communication: Discussed with patient's grandson at bedside  Severity of Illness: The appropriate patient status for this patient is OBSERVATION. Observation status is judged to be reasonable and necessary in order to provide the required intensity of service to ensure the patient's safety. The patient's presenting symptoms, physical exam findings, and initial radiographic and laboratory data in the context of their medical condition is felt to place them at decreased risk for further clinical deterioration. Furthermore, it is anticipated that the patient will be medically stable for discharge from the hospital within 2 midnights of admission.   Author: Drue ONEIDA Potter, MD 01/31/2024 6:17 PM  For on call review [email or web address].

## 2024-02-01 ENCOUNTER — Other Ambulatory Visit: Payer: Self-pay | Admitting: Family Medicine

## 2024-02-01 ENCOUNTER — Ambulatory Visit: Payer: Self-pay | Admitting: Nurse Practitioner

## 2024-02-01 DIAGNOSIS — N3000 Acute cystitis without hematuria: Secondary | ICD-10-CM | POA: Diagnosis not present

## 2024-02-01 DIAGNOSIS — R12 Heartburn: Secondary | ICD-10-CM

## 2024-02-01 LAB — BASIC METABOLIC PANEL WITH GFR
Anion gap: 12 (ref 5–15)
BUN: 31 mg/dL — ABNORMAL HIGH (ref 8–23)
CO2: 27 mmol/L (ref 22–32)
Calcium: 9.2 mg/dL (ref 8.9–10.3)
Chloride: 103 mmol/L (ref 98–111)
Creatinine, Ser: 1.3 mg/dL — ABNORMAL HIGH (ref 0.44–1.00)
GFR, Estimated: 38 mL/min — ABNORMAL LOW (ref >60)
Glucose, Bld: 118 mg/dL — ABNORMAL HIGH (ref 70–99)
Potassium: 4.2 mmol/L (ref 3.5–5.1)
Sodium: 141 mmol/L (ref 135–145)

## 2024-02-01 LAB — CBC
HCT: 43.1 % (ref 36.0–46.0)
Hemoglobin: 14.2 g/dL (ref 12.0–15.0)
MCH: 29.8 pg (ref 26.0–34.0)
MCHC: 32.9 g/dL (ref 30.0–36.0)
MCV: 90.4 fL (ref 80.0–100.0)
Platelets: 147 K/uL — ABNORMAL LOW (ref 150–400)
RBC: 4.77 MIL/uL (ref 3.87–5.11)
RDW: 13.6 % (ref 11.5–15.5)
WBC: 7.1 K/uL (ref 4.0–10.5)
nRBC: 0 % (ref 0.0–0.2)

## 2024-02-01 LAB — GLUCOSE, CAPILLARY
Glucose-Capillary: 318 mg/dL — ABNORMAL HIGH (ref 70–99)
Glucose-Capillary: 357 mg/dL — ABNORMAL HIGH (ref 70–99)
Glucose-Capillary: 248 mg/dL — ABNORMAL HIGH (ref 70–99)
Glucose-Capillary: 219 mg/dL — ABNORMAL HIGH (ref 70–99)

## 2024-02-01 MED ORDER — INSULIN ASPART PROT & ASPART (70-30 MIX) 100 UNIT/ML ~~LOC~~ SUSP
30.0000 [IU] | Freq: Every day | SUBCUTANEOUS | Status: DC
  Administered 2024-02-02 – 2024-02-05 (×4): 30 [IU] via SUBCUTANEOUS
  Filled 2024-02-01: qty 10

## 2024-02-01 MED ORDER — FAMOTIDINE 20 MG PO TABS
20.0000 mg | ORAL_TABLET | Freq: Every day | ORAL | Status: DC
  Administered 2024-02-02 – 2024-02-05 (×4): 20 mg via ORAL
  Filled 2024-02-01 (×4): qty 1

## 2024-02-01 MED ORDER — INSULIN ASPART PROT & ASPART (70-30 MIX) 100 UNIT/ML ~~LOC~~ SUSP
10.0000 [IU] | Freq: Every day | SUBCUTANEOUS | Status: DC
  Administered 2024-02-01 – 2024-02-04 (×4): 10 [IU] via SUBCUTANEOUS
  Filled 2024-02-01: qty 10

## 2024-02-01 NOTE — Progress Notes (Signed)
 Contacted family. Had long discussion about doxycyline with family

## 2024-02-01 NOTE — Evaluation (Addendum)
 Occupational Therapy Evaluation Patient Details Name: Misty Edwards MRN: [id number] DOB: 26-Apr-1926 Today's Date: 02/01/2024   History of Present Illness   Misty Edwards is a 89 y.o. female with medical history significant of coronary artery disease status post CABG, type 2 diabetes mellitus, hyperlipidemia, hypertension, IBS, anxiety who was otherwise well until the last 1 week when she started having generalized weakness, some intermittent confusion as well as dysuria.  Patient followed up with her PCP and was prescribed doxycycline however according to patient's grandson they have not seen any significant improvement in her confusion and therefore came to the emergency room for further management.  Patient denied chest pain, nausea, vomiting, abdominal pain, cough or headache (per MD)     Clinical Impressions Pt agreeable to OT and PT co-evaluation. No family present and pt mildly confused today. Pt reported same living history as prior admission and that she has family with her, but that she is home alone at times. Pt required CGA for bed mobility and CGA to min A for functional transfers. Near loss of balance noted when standing from the toilet today. B UE generally weak. Set up assist for seated ADL's with CGA if standing to complete ADL tasks.Pt left in the chair with call bell within reach and chair alarm set. Pt will benefit from continued OT in the hospital to increase strength, balance, and endurance for safe ADL's.         If plan is discharge home, recommend the following:   A little help with walking and/or transfers;A little help with bathing/dressing/bathroom;Assistance with cooking/housework;Direct supervision/assist for medications management;Assist for transportation;Help with stairs or ramp for entrance     Functional Status Assessment   Patient has had a recent decline in their functional status and demonstrates the ability to make significant improvements in  function in a reasonable and predictable amount of time.     Equipment Recommendations   None recommended by OT              Precautions/Restrictions   Precautions Precautions: Fall Recall of Precautions/Restrictions: Impaired Restrictions Weight Bearing Restrictions Per Provider Order: No     Mobility Bed Mobility Overal bed mobility: Needs Assistance Bed Mobility: Supine to Sit     Supine to sit: Contact guard     General bed mobility comments: Mild labored movement    Transfers Overall transfer level: Needs assistance Equipment used: Rolling walker (2 wheels) Transfers: Sit to/from Stand, Bed to chair/wheelchair/BSC Sit to Stand: Contact guard assist, Min assist     Step pivot transfers: Min assist, Contact guard assist     General transfer comment: Unsteady and labored movement; EOB to chair with RW.      Balance Overall balance assessment: Needs assistance Sitting-balance support: No upper extremity supported, Feet supported Sitting balance-Leahy Scale: Fair Sitting balance - Comments: fair to good seated at EOB.   Standing balance support: Bilateral upper extremity supported, During functional activity, Reliant on assistive device for balance Standing balance-Leahy Scale: Fair Standing balance comment: poor to fair using RW                           ADL either performed or assessed with clinical judgement   ADL Overall ADL's : Needs assistance/impaired     Grooming: Contact guard assist;Standing;Wash/dry hands   Upper Body Bathing: Set up;Sitting   Lower Body Bathing: Set up;Sitting/lateral leans   Upper Body Dressing : Set up;Sitting   Lower  Body Dressing: Set up;Sitting/lateral leans   Toilet Transfer: Minimal assistance;Rolling walker (2 wheels);Stand-pivot;Contact guard assist Toilet Transfer Details (indicate cue type and reason): Ambulation to the toilet with RW; Unsteady with near LOB noted when standing from the  toilet with RW. Toileting- Clothing Manipulation and Hygiene: Supervision/safety;Sitting/lateral lean       Functional mobility during ADLs: Contact guard assist;Minimal assistance;Rolling walker (2 wheels)       Vision Baseline Vision/History: 1 Wears glasses Ability to See in Adequate Light: 1 Impaired Patient Visual Report: No change from baseline Vision Assessment?: No apparent visual deficits     Perception Perception: Not tested       Praxis Praxis: Not tested       Pertinent Vitals/Pain Pain Assessment Pain Assessment: No/denies pain     Extremity/Trunk Assessment Upper Extremity Assessment Upper Extremity Assessment: RUE deficits/detail;LUE deficits/detail RUE Deficits / Details: 4-/5 grossly LUE Deficits / Details: 4-/5 grossly   Lower Extremity Assessment Lower Extremity Assessment: Defer to PT evaluation   Cervical / Trunk Assessment Cervical / Trunk Assessment: Kyphotic   Communication Communication Communication: No apparent difficulties   Cognition Arousal: Alert Behavior During Therapy: WFL for tasks assessed/performed Cognition: No family/caregiver present to determine baseline, Cognition impaired   Orientation impairments: Situation         OT - Cognition Comments: Pt mildly confused but followed commands well.                 Following commands: Intact       Cueing  General Comments   Cueing Techniques: Verbal cues;Tactile cues                 Home Living Family/patient expects to be discharged to:: Private residence Living Arrangements: Other relatives (lives with grandson and his family) Available Help at Discharge: Personal care attendant;Family;Available PRN/intermittently Type of Home: Mobile home Home Access: Ramped entrance     Home Layout: One level     Bathroom Shower/Tub: Chief Strategy Officer: Handicapped height     Home Equipment: Agricultural Consultant (2 wheels);Rollator (4  wheels);BSC/3in1;Wheelchair - manual;Hospital bed;Shower seat;Grab bars - tub/shower          Prior Functioning/Environment Prior Level of Function : Needs assist;History of Falls (last six months)       Physical Assist : ADLs (physical)   ADLs (physical): IADLs Mobility Comments: Household ambulator with RW ADLs Comments: Independent ADL; assist IADL's    OT Problem List: Decreased strength;Decreased activity tolerance;Decreased range of motion;Impaired balance (sitting and/or standing);Decreased cognition   OT Treatment/Interventions: Self-care/ADL training;Therapeutic exercise;Therapeutic activities;Patient/family education;Balance training;Cognitive remediation/compensation;DME and/or AE instruction;Energy conservation      OT Goals(Current goals can be found in the care plan section)   Acute Rehab OT Goals Patient Stated Goal: improve function OT Goal Formulation: With patient Time For Goal Achievement: 02/15/24 Potential to Achieve Goals: Good   OT Frequency:  Min 2X/week    Co-evaluation PT/OT/SLP Co-Evaluation/Treatment: Yes Reason for Co-Treatment: To address functional/ADL transfers   OT goals addressed during session: ADL's and self-care      AM-PAC OT 6 Clicks Daily Activity     Outcome Measure Help from another person eating meals?: A Little Help from another person taking care of personal grooming?: A Little Help from another person toileting, which includes using toliet, bedpan, or urinal?: A Little Help from another person bathing (including washing, rinsing, drying)?: A Little Help from another person to put on and taking off regular upper body clothing?: A Little  Help from another person to put on and taking off regular lower body clothing?: A Little 6 Click Score: 18   End of Session Equipment Utilized During Treatment: Rolling walker (2 wheels)  Activity Tolerance: Patient tolerated treatment well Patient left: in chair;with call bell/phone  within reach;with chair alarm set  OT Visit Diagnosis: Unsteadiness on feet (R26.81);Muscle weakness (generalized) (M62.81);Other symptoms and signs involving cognitive function                Time: 9142-9084 OT Time Calculation (min): 18 min Charges:  OT General Charges $OT Visit: 1 Visit OT Evaluation $OT Eval Low Complexity: 1 Low  Lacey Wallman OT, MOT  Jayson Person 02/01/2024, 11:34 AM

## 2024-02-01 NOTE — TOC Initial Note (Addendum)
 Transition of Care [facility]) - Initial/Assessment Note    Patient Details  Name: Misty Edwards MRN: [id number] Date of Birth: Jul 24, 1926  [facility] CM/SW Contact:    Noreen KATHEE Pinal, LCSWA Phone Number: 02/01/2024, 1:41 PM  Clinical Narrative:                   CSW spoke with patient at bedside to discuss PT recommendation for SNF. Patient gave CSW permission to speak with her grandson about recommendation. CSW called grandson while in patient room. Grandson declined SNF due to adequate support in the home and out source home care services where a nurse name tracy comes in Monday- Friday. Patient has all necessary equipment  such as walker, WC, lift chair, potty chair, and cane etc. Patient nor grandson had any HH preferences, but agreeable to referral being sent. Referral sent via HUB. ICM will continue to follow.  Addendum 2:15 pm   CSW reached out to son and informed him of HH option. Son agreeable to [facility] and thinks that patient had them in the past. CSW accepted in [place].     Expected Discharge Plan: Home w Home Health Services Barriers to Discharge: Continued Medical Work up   Patient Goals and CMS Choice Patient states their goals for this hospitalization and ongoing recovery are:: return back home CMS Medicare.gov Compare Post Acute Care list provided to:: Patient Represenative (must comment) (Grandson) Choice offered to / list presented to :  [place])      Expected Discharge Plan and Services   Discharge Planning Services: CM Consult Post Acute Care Choice: Durable Medical Equipment Living arrangements for the past 2 months: Single Family Home                           HH Arranged: PT          Prior Living Arrangements/Services Living arrangements for the past 2 months: Single Family Home Lives with:: Other (Comment) (Grandson and his wife) Patient language and need for interpreter reviewed:: Yes Do you feel safe going back to the place  where you live?: Yes      Need for Family Participation in Patient Care: Yes (Comment) Care giver support system in place?: Yes (comment) Current home services: DME, [facility]    Activities of Daily Living   ADL Screening (condition at time of admission) Independently performs ADLs?: Yes (appropriate for developmental age) Is the patient deaf or have difficulty hearing?: No Does the patient have difficulty seeing, even when wearing glasses/contacts?: Yes Does the patient have difficulty concentrating, remembering, or making decisions?: Yes  Permission Sought/Granted   Permission granted to share information with : Yes, Verbal Permission Granted  Share Information with NAME: Emberlynn and Ozell     Permission granted to share info w Relationship: patient and grandson     Emotional Assessment Appearance:: Appears stated age Attitude/Demeanor/Rapport: Self-Confident, Engaged Affect (typically observed): Stable, Accepting Orientation: : Oriented to Self, Oriented to Situation, Oriented to Place Alcohol / Substance Use: Not Applicable Psych Involvement: No (comment)  Admission diagnosis:  Lower urinary tract infectious disease [N39.0] Confusion [R41.0] Urinary tract infection [N39.0] Patient Active Problem List   Diagnosis Date Noted   Urinary tract infection 01/31/2024   Upper respiratory infection, viral 04/24/2023   Hyperglycemia due to diabetes mellitus (HCC) 04/09/2023   Hyperkalemia 04/09/2023   Dehydration 04/09/2023   Fall at home, initial encounter 04/09/2023   Scalp hematoma 04/09/2023   Prolonged  QT interval 04/09/2023   Mixed hyperlipidemia 04/09/2023   Chronic heart failure with preserved ejection fraction (HFpEF) (HCC) 04/09/2023   COVID-19 virus infection 04/15/2022   Hypomagnesemia 03/13/2022   Acute on chronic congestive heart failure (HCC) 03/11/2022   Coronary artery disease involving coronary bypass graft of native heart without angina pectoris 03/04/2022    Nonrheumatic mitral valve regurgitation 03/03/2022   Acute exacerbation of CHF (congestive heart failure) (HCC) 03/01/2022   Persistent atrial fibrillation with RVR (HCC) 03/01/2022   Exocrine pancreatic insufficiency 10/04/2021   Heartburn 10/04/2021   Acute pulmonary edema (HCC)    Acute respiratory failure (HCC) 09/23/2019   Palliative care by specialist    Advanced care planning/counseling discussion    Goals of care, counseling/discussion    Disorientation    Acute kidney injury superimposed on chronic kidney disease 09/13/2018   Hyponatremia 09/13/2018   Herpes zoster ophthalmicus 09/03/2018   Right acetabular fracture (HCC) 09/03/2018   Syncope and collapse 09/03/2018   Protein-calorie malnutrition, severe 09/03/2018   Vaginal atrophy 04/29/2018   Angiokeratoma 04/29/2018   PMB (postmenopausal bleeding) 04/29/2018   Acute on chronic diastolic CHF (congestive heart failure) (HCC) 10/23/2016   Acute respiratory failure with hypoxia (HCC) 10/23/2016   Nephrolithiasis 09/27/2016   Hydronephrosis with renal and ureteral calculus obstruction 09/27/2016   Hydronephrosis 09/27/2016   Heme positive stool 09/05/2016   Dyslipidemia 03/07/2015   Type 2 diabetes mellitus with vascular disease (HCC) 03/07/2015   Anemia 09/26/2014   Lesion of lip 09/25/2014   S/P CABG x 3 08/25/2014   NSTEMI (non-ST elevated myocardial infarction) (HCC) 08/24/2014   Acute coronary syndrome (HCC)    IBS (irritable bowel syndrome) 08/26/2012   Thrombocytopenia 02/02/2012   Epigastric pain 02/02/2012   Gastritis 02/02/2012   Normocytic anemia 12/17/2011   History of colon polyps 12/17/2011   Constipation 12/17/2011   Family history of colon cancer 08/22/2011   LLQ pain 08/21/2011   Chronic pancreatitis (HCC) 09/12/2008   HELICOBACTER PYLORI INFECTION 01/26/2008   ADENOCARCINOMA, BREAST 01/26/2008   Essential hypertension 01/26/2008   External hemorrhoids 01/26/2008   GERD 01/26/2008    PSEUDOCYST, PANCREAS 01/26/2008   GALLSTONE PANCREATITIS 01/26/2008   GI BLEEDING 01/26/2008   UTI (urinary tract infection) 01/26/2008   NAUSEA 01/26/2008   VOMITING 01/26/2008   DIARRHEA 01/26/2008   PCP:  Duanne Butler DASEN, MD Pharmacy:   [facility] [address] [place] - [address] [place] [address] Phone: [phone] Fax: [phone]  [facility] - [address], [place] - [address] [place] [address] Phone: [phone] Fax: [phone]  [facility] - [place]  - [place]arissa Reining, [place] - [address] [place] [address] Phone: [phone] Fax: [phone]     Social Drivers of Health (SDOH) Social History: SDOH Screenings   Food Insecurity: No Food Insecurity (01/31/2024)  Housing: Low Risk  (01/31/2024)  Transportation Needs: No Transportation Needs (01/31/2024)  Utilities: Not At Risk (01/31/2024)  Depression (PHQ2-9): Medium Risk (01/26/2024)  Social Connections: Moderately Isolated (01/31/2024)  Tobacco Use: Low Risk  (01/31/2024)   SDOH Interventions:     Readmission Risk Interventions    04/16/2022   11:39 AM 03/05/2022    1:14 PM  Readmission Risk Prevention Plan  Transportation Screening Complete Complete  PCP or Specialist Appt within 5-7 Days  Complete  Home Care Screening  Complete  Medication Review (RN CM)  Complete  Medication Review Oceanographer) Complete  PCP or Specialist appointment within 3-5 days of discharge Not Complete   HRI or Home Care Consult Complete   SW Recovery Care/Counseling Consult Complete   Palliative Care Screening Not Applicable   Skilled Nursing Facility Not Applicable

## 2024-02-01 NOTE — Plan of Care (Signed)
  Problem: Acute Rehab OT Goals (only OT should resolve) Goal: Pt. Will Perform Grooming Flowsheets (Taken 02/01/2024 1137) Pt Will Perform Grooming:  with modified independence  standing Goal: Pt. Will Perform Upper Body Dressing Flowsheets (Taken 02/01/2024 1137) Pt Will Perform Upper Body Dressing: with modified independence Goal: Pt. Will Perform Lower Body Dressing Flowsheets (Taken 02/01/2024 1137) Pt Will Perform Lower Body Dressing: with modified independence Goal: Pt. Will Transfer To Toilet Flowsheets (Taken 02/01/2024 1137) Pt Will Transfer to Toilet:  with modified independence  ambulating Goal: Pt. Will Perform Toileting-Clothing Manipulation Flowsheets (Taken 02/01/2024 1137) Pt Will Perform Toileting - Clothing Manipulation and hygiene:  with modified independence  sitting/lateral leans  sit to/from stand Goal: Pt/Caregiver Will Perform Home Exercise Program Flowsheets (Taken 02/01/2024 1137) Pt/caregiver will Perform Home Exercise Program:  Increased ROM  Increased strength  Right Upper extremity  Left upper extremity  Independently  Skilynn Durney OT, MOT

## 2024-02-01 NOTE — Progress Notes (Addendum)
 PROGRESS NOTE    Misty Edwards  FMW:[id number] DOB: [birth year] DOA: 01/31/2024 PCP: Duanne Butler DASEN, MD   Brief Narrative:    89 y.o. female with medical history significant of coronary artery disease status post CABG, type 2 diabetes mellitus, hyperlipidemia, hypertension, IBS, anxiety who was otherwise well until the last 1 week when she started having generalized weakness, some intermittent confusion as well as dysuria.    She is currently being treated for acute uncomplicated UTI, PT/OT has been consulted. Lives at home with her grandson and his wife.  She also has an aide who comes Monday through Friday.  She may need home health services on discharge, depending on PT/OT recommendations.  Assessment & Plan:  Principal Problem:   Urinary tract infection   Acute metabolic encephalopathy secondary to Partially treated acute uncomplicated urinary tract infection: Mentation has improved now Urinalysis 5-6 days ago showed possible UTI  This could be confounding given recent antibiotic use Continue with intravenous ceftriaxone  Continue to monitor mental status closely PT OT consulted as patient grandson is worried about possible fall     Coronary artery disease status post CABG: No acute issues Continue statin therapy and metoprolol    Type 2 diabetes mellitus Continue to monitor glucose level Continue sliding scale insulin  while inpatient Ordered Novolog  70/30 30 units in am and 10 units at night   Hyperlipidemia Continue Crestor    Paroxysmal atrial fibrillation, on Eliquis  Continue Eliquis  and metoprolol    Hypertension Congestive heart failure Continue Lasix  and metoprolol    GERD Continue pantoprazole    Anxiety-continue fluoxetine   Physical deconditioning, POA: Follow-up PT/OT recommendations.   Disposition: Lives at home with her grandson and his wife.  She also has an aide who comes Monday through Friday, 9 AM and stays there for 4 hours.  She may need home  health services placement.   DVT prophylaxis: apixaban  (ELIQUIS ) tablet 2.5 mg Start: 01/31/24 2200 apixaban  (ELIQUIS ) tablet 2.5 mg     Code Status: Full Code Family Communication: None at the bedside Status is: Observation The patient remains OBS appropriate and will d/c before 2 midnights.    Subjective:  She is alert and awake this morning.  She told me that she lives at home with his grandson and wife.  His grandson works part-time at [employer].  She also told me that she has a companion comes Monday through Friday, 9 AM and stays there with her for 4 hours.  She said that her family is not able to take care of her at home.  Her chief complaint is that she cannot get to the bathroom anymore.  She does have occasional cough.  Examination:  General exam: Appears calm and comfortable  Respiratory system: Clear to auscultation. Respiratory effort normal. Cardiovascular system: S1 & S2 heard, RRR. No JVD, murmurs, rubs, gallops or clicks. No pedal edema. Gastrointestinal system: Abdomen is nondistended, soft and nontender. No organomegaly or masses felt. Normal bowel sounds heard. Central nervous system: Alert and oriented. No focal neurological deficits. Extremities: Symmetric 5 x 5 power. Skin: No rashes, lesions or ulcers    Diet Orders (From admission, onward)     Start     Ordered   01/31/24 2128  Diet heart healthy/carb modified Room service appropriate? Yes; Fluid consistency: Thin  Diet effective now       Question Answer Comment  Diet-HS Snack? Nothing   Room service appropriate? Yes   Fluid consistency: Thin      01/31/24 2127  Objective: Vitals:   01/31/24 2204 01/31/24 2218 02/01/24 0332 02/01/24 0826  BP:  (!) 165/90 (!) 164/83 (!) 165/91  Pulse:  97 80 85  Resp:  16 16 17   Temp:  97.7 F (36.5 C) 98.3 F (36.8 C) 98.4 F (36.9 C)  TempSrc:  Oral Oral Oral  SpO2:  95% 94% 96%  Weight: 67.4 kg     Height: 5' 3 (1.6 m)        Intake/Output Summary (Last 24 hours) at 02/01/2024 0906 Last data filed at 02/01/2024 0846 Gross per 24 hour  Intake 240 ml  Output --  Net 240 ml   Filed Weights   01/31/24 1246 01/31/24 2204  Weight: 66 kg 67.4 kg    Scheduled Meds:  apixaban   2.5 mg Oral BID   famotidine   40 mg Oral Daily   FLUoxetine   20 mg Oral Daily   furosemide   20 mg Oral q1600   furosemide   40 mg Oral Q breakfast   insulin  aspart  0-5 Units Subcutaneous QHS   insulin  aspart  0-9 Units Subcutaneous TID WC   metoprolol  tartrate  50 mg Oral BID   pantoprazole   40 mg Oral Daily   rosuvastatin   5 mg Oral QHS   Continuous Infusions:  cefTRIAXone  (ROCEPHIN )  IV      Nutritional status     Body mass index is 26.32 kg/m.  Data Reviewed:   CBC: Recent Labs  Lab 01/31/24 1456 02/01/24 0354  WBC 7.6 7.1  HGB 14.2 14.2  HCT 43.9 43.1  MCV 91.6 90.4  PLT 166 147*   Basic Metabolic Panel: Recent Labs  Lab 01/31/24 1456 02/01/24 0354  NA 142 141  K 4.5 4.2  CL 101 103  CO2 30 27  GLUCOSE 70 118*  BUN 33* 31*  CREATININE 1.38* 1.30*  CALCIUM  9.5 9.2   GFR: Estimated Creatinine Clearance: 23.9 mL/min (A) (by C-G formula based on SCr of 1.3 mg/dL (H)). Liver Function Tests: Recent Labs  Lab 01/31/24 1456  AST 47*  ALT 39  ALKPHOS 69  BILITOT 0.9  PROT 7.6  ALBUMIN  4.7   No results for input(s): LIPASE, AMYLASE in the last 168 hours. No results for input(s): AMMONIA in the last 168 hours. Coagulation Profile: No results for input(s): INR, PROTIME in the last 168 hours. Cardiac Enzymes: No results for input(s): CKTOTAL, CKMB, CKMBINDEX, TROPONINI in the last 168 hours. BNP (last 3 results) No results for input(s): PROBNP in the last 8760 hours. HbA1C: No results for input(s): HGBA1C in the last 72 hours. CBG: Recent Labs  Lab 01/31/24 1551 01/31/24 1607 01/31/24 1827 01/31/24 2228 02/01/24 0743  GLUCAP 65* 83 76 151* 318*   Lipid  Profile: No results for input(s): CHOL, HDL, LDLCALC, TRIG, CHOLHDL, LDLDIRECT in the last 72 hours. Thyroid  Function Tests: No results for input(s): TSH, T4TOTAL, FREET4, T3FREE, THYROIDAB in the last 72 hours. Anemia Panel: No results for input(s): VITAMINB12, FOLATE, FERRITIN, TIBC, IRON , RETICCTPCT in the last 72 hours. Sepsis Labs: No results for input(s): PROCALCITON, LATICACIDVEN in the last 168 hours.  Recent Results (from the past 240 hours)  Urine Culture     Status: Abnormal   Collection Time: 01/26/24  3:54 PM   Specimen: Urine   UR  Result Value Ref Range Status   Urine Culture, Routine Final report (A)  Final   Organism ID, Bacteria Escherichia coli (A)  Final    Comment: Cefazolin  with an MIC <=16 predicts susceptibility to  the oral agents cefaclor, cefdinir , cefpodoxime, cefprozil, cefuroxime , cephalexin , and loracarbef when used for therapy of uncomplicated urinary tract infections due to E. coli, Klebsiella pneumoniae, and Proteus mirabilis. Greater than 100,000 colony forming units per mL    ORGANISM ID, BACTERIA Comment  Final    Comment: Mixed urogenital flora 25,000-50,000 colony forming units per mL    Antimicrobial Susceptibility Comment  Final    Comment:       ** S = Susceptible; I = Intermediate; R = Resistant **                    P = Positive; N = Negative             MICS are expressed in micrograms per mL    Antibiotic                 RSLT#1    RSLT#2    RSLT#3    RSLT#4 Amoxicillin/Clavulanic Acid    S Ampicillin                     S Cefazolin                       S Cefepime                       S Cefoxitin                      S Cefpodoxime                    S Ceftriaxone                     S Ciprofloxacin                   S Ertapenem                      S Gentamicin                     S Levofloxacin                   S Meropenem                      S Nitrofurantoin                  S Piperacillin/Tazobactam        S Tetracycline                   S Tobramycin                     S Trimethoprim/Sulfa             S   Microscopic Examination     Status: Abnormal   Collection Time: 01/26/24  3:54 PM   Urine  Result Value Ref Range Status   WBC, UA 11-30 (A) 0 - 5 /hpf Final   RBC, Urine 0-2 0 - 2 /hpf Final   Epithelial Cells (non renal) 0-10 0 - 10 /hpf Final   Renal Epithel, UA None seen None seen /hpf Final   Casts Present (A) None seen /lpf Final   Cast Type Hyaline casts N/A Final   Bacteria, UA Many (A) None seen/Few Final   Yeast, UA None seen None  seen Final         Radiology Studies: No results found.         LOS: 0 days   Time spent= 35 mins    Deliliah Room, MD [facility]  If 7PM-7AM, please contact night-coverage  02/01/2024, 9:06 AM

## 2024-02-01 NOTE — Plan of Care (Signed)
  Problem: Acute Rehab PT Goals(only PT should resolve) Goal: Pt Will Go Supine/Side To Sit Outcome: Progressing Flowsheets (Taken 02/01/2024 1233) Pt will go Supine/Side to Sit:  with modified independence  Independently Goal: Patient Will Transfer Sit To/From Stand Outcome: Progressing Flowsheets (Taken 02/01/2024 1233) Patient will transfer sit to/from stand:  with modified independence  with supervision Goal: Pt Will Transfer Bed To Chair/Chair To Bed Outcome: Progressing Flowsheets (Taken 02/01/2024 1233) Pt will Transfer Bed to Chair/Chair to Bed:  with modified independence  with supervision Goal: Pt Will Ambulate Outcome: Progressing Flowsheets (Taken 02/01/2024 1233) Pt will Ambulate:  75 feet  with modified independence  with supervision  with rolling walker   12:33 PM, 02/01/24 Lynwood Music, MPT Physical Therapist with [facility] [phone] office [phone] mobile phone

## 2024-02-01 NOTE — Evaluation (Signed)
 Physical Therapy Evaluation Patient Details Name: Misty Edwards MRN: [id number] DOB: [birth year] Today's Date: 02/01/2024  History of Present Illness  Misty Edwards is a 89 y.o. female with medical history significant of coronary artery disease status post CABG, type 2 diabetes mellitus, hyperlipidemia, hypertension, IBS, anxiety who was otherwise well until the last 1 week when she started having generalized weakness, some intermittent confusion as well as dysuria.  Patient followed up with her PCP and was prescribed doxycycline however according to patient's grandson they have not seen any significant improvement in her confusion and therefore came to the emergency room for further management.  Patient denied chest pain, nausea, vomiting, abdominal pain, cough or headache   Clinical Impression  Patient demonstrates fair/good carryover for sitting up at bedside, unsteady during transfer to/from commode due to BLE weakness, tolerated ambulating in hallway without loss of balance, but, limited due to fatigue. Patient tolerated sitting up in chair after therapy. Patient will benefit from continued skilled physical therapy in hospital and recommended venue below to increase strength, balance, endurance for safe ADLs and gait.          If plan is discharge home, recommend the following: A little help with walking and/or transfers;A little help with bathing/dressing/bathroom;Help with stairs or ramp for entrance;Assist for transportation;Assistance with cooking/housework   Can travel by private vehicle   Yes    Equipment Recommendations None recommended by PT  Recommendations for Other Services       Functional Status Assessment Patient has had a recent decline in their functional status and demonstrates the ability to make significant improvements in function in a reasonable and predictable amount of time.     Precautions / Restrictions Precautions Precautions: Fall Recall of  Precautions/Restrictions: Impaired Restrictions Weight Bearing Restrictions Per Provider Order: No      Mobility  Bed Mobility Overal bed mobility: Needs Assistance Bed Mobility: Supine to Sit     Supine to sit: Contact guard     General bed mobility comments: slightly labored movement with increased time    Transfers Overall transfer level: Needs assistance Equipment used: Rolling walker (2 wheels) Transfers: Sit to/from Stand, Bed to chair/wheelchair/BSC Sit to Stand: Contact guard assist, Min assist   Step pivot transfers: Min assist, Contact guard assist       General transfer comment: labored movement with unsteady movement during transfers to chair and commode in bathroom    Ambulation/Gait Ambulation/Gait assistance: Contact guard assist, Min assist Gait Distance (Feet): 45 Feet Assistive device: Rolling walker (2 wheels) Gait Pattern/deviations: Decreased step length - right, Decreased step length - left, Decreased stride length Gait velocity: decreased     General Gait Details: slightly labored movement without loss of balance, limited mostly due to fatigue and occasional repeated verbal cueing possibly due to [facility]  Stairs            Wheelchair Mobility     Tilt Bed    Modified Rankin (Stroke Patients Only)       Balance Overall balance assessment: Needs assistance Sitting-balance support: Feet supported, No upper extremity supported Sitting balance-Leahy Scale: Fair Sitting balance - Comments: fair to good seated at EOB.   Standing balance support: Bilateral upper extremity supported, During functional activity, Reliant on assistive device for balance Standing balance-Leahy Scale: Fair Standing balance comment: poor to fair using RW  Pertinent Vitals/Pain Pain Assessment Pain Assessment: No/denies pain    Home Living Family/patient expects to be discharged to:: Private residence Living  Arrangements: Other relatives Available Help at Discharge: Personal care attendant;Family;Available PRN/intermittently Type of Home: Mobile home Home Access: Ramped entrance       Home Layout: One level Home Equipment: Agricultural Consultant (2 wheels);Rollator (4 wheels);BSC/3in1;Wheelchair - manual;Hospital bed;Shower seat;Grab bars - tub/shower      Prior Function Prior Level of Function : Needs assist;History of Falls (last six months)       Physical Assist : ADLs (physical);Mobility (physical) Mobility (physical): Bed mobility;Transfers;Gait;Stairs ADLs (physical): IADLs Mobility Comments: Household ambulator with RW ADLs Comments: Independent ADL; assist IADL's     Extremity/Trunk Assessment   Upper Extremity Assessment Upper Extremity Assessment: Defer to OT evaluation RUE Deficits / Details: 4-/5 grossly LUE Deficits / Details: 4-/5 grossly    Lower Extremity Assessment Lower Extremity Assessment: Generalized weakness    Cervical / Trunk Assessment Cervical / Trunk Assessment: Kyphotic  Communication   Communication Communication: No apparent difficulties    Cognition Arousal: Alert Behavior During Therapy: WFL for tasks assessed/performed                             Following commands: Intact       Cueing Cueing Techniques: Verbal cues, Tactile cues     General Comments      Exercises     Assessment/Plan    PT Assessment Patient needs continued PT services  PT Problem List Decreased strength;Decreased activity tolerance;Decreased balance;Decreased mobility       PT Treatment Interventions DME instruction;Gait training;Stair training;Functional mobility training;Therapeutic activities;Therapeutic exercise;Balance training;Patient/family education    PT Goals (Current goals can be found in the Care Plan section)  Acute Rehab PT Goals Patient Stated Goal: return home with family to assist PT Goal Formulation: With patient Time For Goal  Achievement: 02/15/24 Potential to Achieve Goals: Good    Frequency Min 3X/week     Co-evaluation PT/OT/SLP Co-Evaluation/Treatment: Yes Reason for Co-Treatment: To address functional/ADL transfers PT goals addressed during session: Mobility/safety with mobility;Balance;Proper use of DME OT goals addressed during session: ADL's and self-care       AM-PAC PT 6 Clicks Mobility  Outcome Measure Help needed turning from your back to your side while in a flat bed without using bedrails?: None Help needed moving from lying on your back to sitting on the side of a flat bed without using bedrails?: None Help needed moving to and from a bed to a chair (including a wheelchair)?: A Little Help needed standing up from a chair using your arms (e.g., wheelchair or bedside chair)?: A Little Help needed to walk in hospital room?: A Lot Help needed climbing 3-5 steps with a railing? : A Lot 6 Click Score: 18    End of Session   Activity Tolerance: Patient tolerated treatment well;Patient limited by fatigue Patient left: in chair;with call bell/phone within reach;with chair alarm set Nurse Communication: Mobility status PT Visit Diagnosis: Other abnormalities of gait and mobility (R26.89);Unsteadiness on feet (R26.81);Muscle weakness (generalized) (M62.81)    Time: 9140-9086 PT Time Calculation (min) (ACUTE ONLY): 14 min   Charges:   PT Evaluation $PT Eval Low Complexity: 1 Low PT Treatments $Therapeutic Activity: 8-22 mins PT General Charges $$ ACUTE PT VISIT: 1 Visit         12:32 PM, 02/01/24 Lynwood Music, MPT Physical Therapist with [facility] 336 [phone]  office [phone] mobile phone

## 2024-02-02 ENCOUNTER — Observation Stay (HOSPITAL_COMMUNITY)

## 2024-02-02 DIAGNOSIS — Z806 Family history of leukemia: Secondary | ICD-10-CM | POA: Diagnosis not present

## 2024-02-02 DIAGNOSIS — N3000 Acute cystitis without hematuria: Secondary | ICD-10-CM

## 2024-02-02 DIAGNOSIS — N390 Urinary tract infection, site not specified: Secondary | ICD-10-CM | POA: Diagnosis present

## 2024-02-02 DIAGNOSIS — B9620 Unspecified Escherichia coli [E. coli] as the cause of diseases classified elsewhere: Secondary | ICD-10-CM | POA: Diagnosis present

## 2024-02-02 DIAGNOSIS — H548 Legal blindness, as defined in USA: Secondary | ICD-10-CM | POA: Diagnosis present

## 2024-02-02 DIAGNOSIS — I4821 Permanent atrial fibrillation: Secondary | ICD-10-CM | POA: Diagnosis present

## 2024-02-02 DIAGNOSIS — Z951 Presence of aortocoronary bypass graft: Secondary | ICD-10-CM | POA: Diagnosis not present

## 2024-02-02 DIAGNOSIS — Z800 Family history of malignant neoplasm of digestive organs: Secondary | ICD-10-CM | POA: Diagnosis not present

## 2024-02-02 DIAGNOSIS — I4891 Unspecified atrial fibrillation: Secondary | ICD-10-CM | POA: Diagnosis not present

## 2024-02-02 DIAGNOSIS — Z86000 Personal history of in-situ neoplasm of breast: Secondary | ICD-10-CM | POA: Diagnosis not present

## 2024-02-02 DIAGNOSIS — E785 Hyperlipidemia, unspecified: Secondary | ICD-10-CM | POA: Diagnosis present

## 2024-02-02 DIAGNOSIS — Z8249 Family history of ischemic heart disease and other diseases of the circulatory system: Secondary | ICD-10-CM | POA: Diagnosis not present

## 2024-02-02 DIAGNOSIS — G9341 Metabolic encephalopathy: Secondary | ICD-10-CM | POA: Diagnosis present

## 2024-02-02 DIAGNOSIS — E1122 Type 2 diabetes mellitus with diabetic chronic kidney disease: Secondary | ICD-10-CM | POA: Diagnosis present

## 2024-02-02 DIAGNOSIS — N1832 Chronic kidney disease, stage 3b: Secondary | ICD-10-CM | POA: Diagnosis present

## 2024-02-02 DIAGNOSIS — I482 Chronic atrial fibrillation, unspecified: Secondary | ICD-10-CM | POA: Diagnosis not present

## 2024-02-02 DIAGNOSIS — I130 Hypertensive heart and chronic kidney disease with heart failure and stage 1 through stage 4 chronic kidney disease, or unspecified chronic kidney disease: Secondary | ICD-10-CM | POA: Diagnosis present

## 2024-02-02 DIAGNOSIS — Z9011 Acquired absence of right breast and nipple: Secondary | ICD-10-CM | POA: Diagnosis not present

## 2024-02-02 DIAGNOSIS — Z79899 Other long term (current) drug therapy: Secondary | ICD-10-CM | POA: Diagnosis not present

## 2024-02-02 DIAGNOSIS — Z794 Long term (current) use of insulin: Secondary | ICD-10-CM | POA: Diagnosis not present

## 2024-02-02 DIAGNOSIS — F0394 Unspecified dementia, unspecified severity, with anxiety: Secondary | ICD-10-CM | POA: Diagnosis present

## 2024-02-02 DIAGNOSIS — Z8041 Family history of malignant neoplasm of ovary: Secondary | ICD-10-CM | POA: Diagnosis not present

## 2024-02-02 DIAGNOSIS — K219 Gastro-esophageal reflux disease without esophagitis: Secondary | ICD-10-CM | POA: Diagnosis present

## 2024-02-02 DIAGNOSIS — I2510 Atherosclerotic heart disease of native coronary artery without angina pectoris: Secondary | ICD-10-CM | POA: Diagnosis present

## 2024-02-02 DIAGNOSIS — Z7901 Long term (current) use of anticoagulants: Secondary | ICD-10-CM | POA: Diagnosis not present

## 2024-02-02 DIAGNOSIS — Z8711 Personal history of peptic ulcer disease: Secondary | ICD-10-CM | POA: Diagnosis not present

## 2024-02-02 LAB — GLUCOSE, CAPILLARY
Glucose-Capillary: 267 mg/dL — ABNORMAL HIGH (ref 70–99)
Glucose-Capillary: 162 mg/dL — ABNORMAL HIGH (ref 70–99)
Glucose-Capillary: 141 mg/dL — ABNORMAL HIGH (ref 70–99)
Glucose-Capillary: 188 mg/dL — ABNORMAL HIGH (ref 70–99)

## 2024-02-02 MED ORDER — PROCHLORPERAZINE EDISYLATE 10 MG/2ML IJ SOLN: 10.0000 mg | Freq: Four times a day (QID) | INTRAMUSCULAR | Status: DC | PRN

## 2024-02-02 MED ORDER — INSULIN ASPART 100 UNIT/ML IJ SOLN
0.0000 [IU] | Freq: Three times a day (TID) | INTRAMUSCULAR | Status: DC
  Administered 2024-02-02: 2 [IU] via SUBCUTANEOUS
  Administered 2024-02-02: 3 [IU] via SUBCUTANEOUS
  Administered 2024-02-03: 5 [IU] via SUBCUTANEOUS
  Administered 2024-02-03: 8 [IU] via SUBCUTANEOUS
  Filled 2024-02-02 (×3): qty 1

## 2024-02-02 MED ORDER — METOPROLOL TARTRATE 25 MG PO TABS
25.0000 mg | ORAL_TABLET | Freq: Once | ORAL | Status: AC
  Administered 2024-02-02: 25 mg via ORAL
  Filled 2024-02-02: qty 1

## 2024-02-02 MED ORDER — DILTIAZEM HCL 25 MG/5ML IV SOLN
15.0000 mg | Freq: Once | INTRAVENOUS | Status: AC
  Administered 2024-02-02: 15 mg via INTRAVENOUS
  Filled 2024-02-02: qty 5

## 2024-02-02 MED ORDER — SODIUM CHLORIDE 0.9 % IV SOLN: INTRAVENOUS | Status: AC

## 2024-02-02 MED ORDER — SODIUM CHLORIDE 0.9 % IV BOLUS
250.0000 mL | Freq: Once | INTRAVENOUS | Status: AC
  Administered 2024-02-02: 250 mL via INTRAVENOUS

## 2024-02-02 MED ORDER — METOPROLOL TARTRATE 50 MG PO TABS
75.0000 mg | ORAL_TABLET | Freq: Two times a day (BID) | ORAL | Status: DC
  Administered 2024-02-02 – 2024-02-04 (×4): 75 mg via ORAL
  Filled 2024-02-02 (×4): qty 1

## 2024-02-02 MED ORDER — FUROSEMIDE 20 MG PO TABS
20.0000 mg | ORAL_TABLET | Freq: Every day | ORAL | Status: DC
  Administered 2024-02-03 – 2024-02-05 (×3): 20 mg via ORAL
  Filled 2024-02-02 (×3): qty 1

## 2024-02-02 MED ORDER — HALOPERIDOL 0.5 MG PO TABS
1.0000 mg | ORAL_TABLET | Freq: Four times a day (QID) | ORAL | Status: DC | PRN
  Administered 2024-02-05: 1 mg via ORAL
  Filled 2024-02-02: qty 2

## 2024-02-02 MED ORDER — HALOPERIDOL LACTATE 5 MG/ML IJ SOLN
1.0000 mg | Freq: Four times a day (QID) | INTRAMUSCULAR | Status: DC | PRN
Start: 2024-02-02 — End: 2024-02-05
  Administered 2024-02-02: 1 mg via INTRAMUSCULAR
  Filled 2024-02-02 (×2): qty 1

## 2024-02-02 MED ORDER — ALUM & MAG HYDROXIDE-SIMETH 200-200-20 MG/5ML PO SUSP: 30.0000 mL | ORAL | Status: DC | PRN

## 2024-02-02 NOTE — Progress Notes (Addendum)
 PROGRESS NOTE    Misty Edwards  FMW:[id number]  DOB: February 17, 1927  DOA: 01/31/2024 PCP: Duanne Butler DASEN, MD Outpatient Specialists:   Hospital course:  89 y.o. female with medical history significant of coronary artery disease status post CABG, type 2 diabetes mellitus, hyperlipidemia, hypertension, IBS, anxiety who was otherwise well until the last 1 week when she started having generalized weakness, some intermittent confusion as well as dysuria.   She is currently being treated for acute uncomplicated UTI, PT/OT has been consulted. Lives at home with her grandson and his wife.  She also has an aide who comes Monday through Friday.  She may need home health services on discharge, depending on PT/OT recommendations.   Subjective:  Patient is wondering whether she can go to her 930 appointment with the doctor.  I remind her that she is in the hospital and she seems to understand.  I ask her where she lives and she is not sure if she lives with her grand son or her grand daughter.  Objective: Vitals:   02/01/24 1337 02/01/24 2026 02/01/24 2100 02/02/24 0439  BP: (!) 158/74 108/61 (!) 153/95 113/65  Pulse: 90  (!) 110   Resp: 17  20 20   Temp: 98.6 F (37 C)  100 F (37.8 C) 98.9 F (37.2 C)  TempSrc: Oral  Oral Oral  SpO2: 93%  96%   Weight:      Height:        Intake/Output Summary (Last 24 hours) at 02/02/2024 1148 Last data filed at 02/01/2024 1737 Gross per 24 hour  Intake 480 ml  Output --  Net 480 ml   Filed Weights   01/31/24 1246 01/31/24 2204  Weight: 66 kg 67.4 kg     Exam:  General: Patient appears comfortable sitting up in bed communicates clearly but is clearly confused. Eyes: sclera anicteric, conjuctiva mild injection bilaterally CVS: S1-S2, regular  Respiratory:  decreased air entry bilaterally secondary to decreased inspiratory effort, rales at bases  GI: NABS, soft, NT  LE: Warm and well-perfused Neuro: Confused, moving all extremities  without difficulty.  Data Reviewed:  Basic Metabolic Panel: Recent Labs  Lab 01/31/24 1456 02/01/24 0354  NA 142 141  K 4.5 4.2  CL 101 103  CO2 30 27  GLUCOSE 70 118*  BUN 33* 31*  CREATININE 1.38* 1.30*  CALCIUM  9.5 9.2    CBC: Recent Labs  Lab 01/31/24 1456 02/01/24 0354  WBC 7.6 7.1  HGB 14.2 14.2  HCT 43.9 43.1  MCV 91.6 90.4  PLT 166 147*     Scheduled Meds:  apixaban   2.5 mg Oral BID   famotidine   20 mg Oral Daily   FLUoxetine   20 mg Oral Daily   furosemide   20 mg Oral q1600   furosemide   40 mg Oral Q breakfast   insulin  aspart  0-5 Units Subcutaneous QHS   insulin  aspart  0-9 Units Subcutaneous TID WC   insulin  aspart protamine - aspart  10 Units Subcutaneous Q supper   insulin  aspart protamine - aspart  30 Units Subcutaneous Q breakfast   metoprolol  tartrate  50 mg Oral BID   pantoprazole   40 mg Oral Daily   rosuvastatin   5 mg Oral QHS   Continuous Infusions:  sodium chloride      cefTRIAXone  (ROCEPHIN )  IV 1 g (02/01/24 1440)   sodium chloride        Assessment & Plan:   Atrial fibrillation Has now developed RVR at 140 likely secondary to UTI  with possible intravascular volume depletion as contributory Patient is asymptomatic, denies chest pain, hemodynamically stable  Diltiazem  15 mg IV x 1 requested Continue metoprolol  50 twice daily Hydrate gently at 50 cc an hour Decrease a.m. Lasix  to 20 mg, continue Lasix  20 mg in afternoon Repeat echocardiogram ordered Patient's grandson who is POA notes that her cardiologist is Dr. Alvan and he would like for him to be informed. Continue Eliquis   Acute metabolic encephalopathy Uncomplicated UTI Anxiety Continue treatment of pansensitive E. coli with ceftriaxone  As needed Haldol ordered Continue home doses of fluoxetine   DM2 Blood sugars still running in the mid 200s Increase sliding scale from sensitive to moderate Continue 70/30 30 units in a.m. and 10 units in p.m. as previously  ordered  Physical deconditioning POA PT has recommended home health rehab Patient's grandson who is POA does not want patient to go to a nursing home  CKD 3B Creatinine is improved from baseline  CAD Hyperlipidemia Continue home meds  GERD Continue PPI   DVT prophylaxis: Eliquis  Code Status: Full Family Communication: Spoke with patient's grandson Misty Edwards, [place] today at length     Studies: No results found.  Principal Problem:   Urinary tract infection     Ivan Vangie Pike, [facility]  If 7PM-7AM, please contact night-coverage [email or web address]   LOS: 0 days

## 2024-02-02 NOTE — Progress Notes (Signed)
 Patient discussed with primary team, admitted with UTI. During admission some issues with afib with RVR. She has essentially permanent afib that is rate controlled as outpatient on metoprolol  50mg  bid. Suspect increase in rates are due to systemic illness/ stress from UTI. High rates this AM, given 15mg  of IV diltiazem  by primary team and f/u rates 80s to 90s. Will increase her lopressor  to 75mg  bid, can use prn dilt or lopressor  as needed. Rates should improve as systemic illness resolves. We will follow telemetry peripherally tomorrow.   Dorn Ross MD

## 2024-02-02 NOTE — Care Management Obs Status (Signed)
 MEDICARE OBSERVATION STATUS NOTIFICATION   Patient Details  Name: Misty Edwards MRN: [id number] Date of Birth: 1927-02-01   Medicare Observation Status Notification Given:  Yes    Duwaine LITTIE Ada 02/02/2024, 9:08 AM

## 2024-02-02 NOTE — Plan of Care (Signed)
 Pt with elevated HR. Notified adefeso and EKG completed. Pt in afib with RVR. Orders to give 10am metoprolol  early.  Problem: Education: Goal: Knowledge of General Education information will improve Description: Including pain rating scale, medication(s)/side effects and non-pharmacologic comfort measures Outcome: Progressing   Problem: Health Behavior/Discharge Planning: Goal: Ability to manage health-related needs will improve Outcome: Progressing   Problem: Clinical Measurements: Goal: Ability to maintain clinical measurements within normal limits will improve Outcome: Progressing Goal: Will remain free from infection Outcome: Progressing Goal: Diagnostic test results will improve Outcome: Progressing Goal: Respiratory complications will improve Outcome: Progressing Goal: Cardiovascular complication will be avoided Outcome: Progressing   Problem: Activity: Goal: Risk for activity intolerance will decrease Outcome: Progressing   Problem: Nutrition: Goal: Adequate nutrition will be maintained Outcome: Progressing   Problem: Coping: Goal: Level of anxiety will decrease Outcome: Progressing   Problem: Elimination: Goal: Will not experience complications related to bowel motility Outcome: Progressing Goal: Will not experience complications related to urinary retention Outcome: Progressing   Problem: Pain Managment: Goal: General experience of comfort will improve and/or be controlled Outcome: Progressing   Problem: Safety: Goal: Ability to remain free from injury will improve Outcome: Progressing   Problem: Skin Integrity: Goal: Risk for impaired skin integrity will decrease Outcome: Progressing   Problem: Education: Goal: Ability to describe self-care measures that may prevent or decrease complications (Diabetes Survival Skills Education) will improve Outcome: Progressing Goal: Individualized Educational Video(s) Outcome: Progressing   Problem: Coping: Goal:  Ability to adjust to condition or change in health will improve Outcome: Progressing   Problem: Fluid Volume: Goal: Ability to maintain a balanced intake and output will improve Outcome: Progressing   Problem: Health Behavior/Discharge Planning: Goal: Ability to identify and utilize available resources and services will improve Outcome: Progressing Goal: Ability to manage health-related needs will improve Outcome: Progressing   Problem: Metabolic: Goal: Ability to maintain appropriate glucose levels will improve Outcome: Progressing   Problem: Nutritional: Goal: Maintenance of adequate nutrition will improve Outcome: Progressing Goal: Progress toward achieving an optimal weight will improve Outcome: Progressing   Problem: Skin Integrity: Goal: Risk for impaired skin integrity will decrease Outcome: Progressing   Problem: Tissue Perfusion: Goal: Adequacy of tissue perfusion will improve Outcome: Progressing

## 2024-02-02 NOTE — Progress Notes (Signed)
  Echocardiogram 2D Echocardiogram has been attempted. Spoke with nurse staff who said doc requested echo to be done tomorrow due to pt agitation.   Misty Edwards RDCS 02/02/2024, 2:19 PM

## 2024-02-02 NOTE — Plan of Care (Signed)
  Problem: Health Behavior/Discharge Planning: Goal: Ability to manage health-related needs will improve Outcome: Progressing   Problem: Clinical Measurements: Goal: Ability to maintain clinical measurements within normal limits will improve Outcome: Progressing   Problem: Activity: Goal: Risk for activity intolerance will decrease Outcome: Progressing   Problem: Nutrition: Goal: Adequate nutrition will be maintained Outcome: Progressing   Problem: Elimination: Goal: Will not experience complications related to urinary retention Outcome: Progressing   Problem: Pain Managment: Goal: General experience of comfort will improve and/or be controlled Outcome: Progressing   Problem: Safety: Goal: Ability to remain free from injury will improve Outcome: Progressing   Problem: Skin Integrity: Goal: Risk for impaired skin integrity will decrease Outcome: Progressing   Problem: Education: Goal: Knowledge of General Education information will improve Description: Including pain rating scale, medication(s)/side effects and non-pharmacologic comfort measures Outcome: Not Progressing   Problem: Coping: Goal: Level of anxiety will decrease Outcome: Not Progressing

## 2024-02-03 ENCOUNTER — Inpatient Hospital Stay (HOSPITAL_COMMUNITY)

## 2024-02-03 DIAGNOSIS — G9341 Metabolic encephalopathy: Secondary | ICD-10-CM | POA: Diagnosis not present

## 2024-02-03 DIAGNOSIS — I4891 Unspecified atrial fibrillation: Secondary | ICD-10-CM

## 2024-02-03 DIAGNOSIS — N3000 Acute cystitis without hematuria: Secondary | ICD-10-CM | POA: Diagnosis not present

## 2024-02-03 LAB — ECHOCARDIOGRAM COMPLETE
AR max vel: 1.59 cm2
AV Area VTI: 1.71 cm2
AV Area mean vel: 1.89 cm2
AV Mean grad: 3.1 mmHg
AV Peak grad: 6.5 mmHg
Ao pk vel: 1.27 m/s
Area-P 1/2: 6.54 cm2
Height: 63 in
MV M vel: 5.4 m/s
MV Peak grad: 116.6 mmHg
P 1/2 time: 479 ms
Radius: 0.5 cm
S' Lateral: 2.6 cm
Weight: 2377.44 [oz_av]

## 2024-02-03 LAB — BASIC METABOLIC PANEL WITH GFR
Anion gap: 15 (ref 5–15)
BUN: 36 mg/dL — ABNORMAL HIGH (ref 8–23)
CO2: 27 mmol/L (ref 22–32)
Calcium: 9.3 mg/dL (ref 8.9–10.3)
Chloride: 97 mmol/L — ABNORMAL LOW (ref 98–111)
Creatinine, Ser: 1.53 mg/dL — ABNORMAL HIGH (ref 0.44–1.00)
GFR, Estimated: 31 mL/min — ABNORMAL LOW (ref >60)
Glucose, Bld: 184 mg/dL — ABNORMAL HIGH (ref 70–99)
Potassium: 3.7 mmol/L (ref 3.5–5.1)
Sodium: 139 mmol/L (ref 135–145)

## 2024-02-03 LAB — MAGNESIUM: Magnesium: 2.4 mg/dL (ref 1.7–2.4)

## 2024-02-03 LAB — GLUCOSE, CAPILLARY
Glucose-Capillary: 226 mg/dL — ABNORMAL HIGH (ref 70–99)
Glucose-Capillary: 279 mg/dL — ABNORMAL HIGH (ref 70–99)
Glucose-Capillary: 57 mg/dL — ABNORMAL LOW (ref 70–99)
Glucose-Capillary: 102 mg/dL — ABNORMAL HIGH (ref 70–99)
Glucose-Capillary: 185 mg/dL — ABNORMAL HIGH (ref 70–99)
Glucose-Capillary: 95 mg/dL (ref 70–99)

## 2024-02-03 MED ORDER — PANCRELIPASE (LIP-PROT-AMYL) 12000-38000 UNITS PO CPEP
24000.0000 [IU] | ORAL_CAPSULE | Freq: Every day | ORAL | Status: DC
  Administered 2024-02-03 – 2024-02-04 (×2): 24000 [IU] via ORAL
  Filled 2024-02-03 (×2): qty 2

## 2024-02-03 MED ORDER — PANCRELIPASE (LIP-PROT-AMYL) 36000-114000 UNITS PO CPEP
48000.0000 [IU] | ORAL_CAPSULE | Freq: Three times a day (TID) | ORAL | Status: DC
  Administered 2024-02-03 – 2024-02-05 (×6): 48000 [IU] via ORAL
  Filled 2024-02-03 (×6): qty 1

## 2024-02-03 MED ORDER — SODIUM CHLORIDE 0.9 % IV SOLN: INTRAVENOUS | Status: DC

## 2024-02-03 MED ORDER — METOPROLOL TARTRATE 5 MG/5ML IV SOLN
2.5000 mg | Freq: Once | INTRAVENOUS | Status: AC
  Administered 2024-02-03: 2.5 mg via INTRAVENOUS
  Filled 2024-02-03: qty 5

## 2024-02-03 MED ORDER — INSULIN ASPART 100 UNIT/ML IJ SOLN
0.0000 [IU] | Freq: Three times a day (TID) | INTRAMUSCULAR | Status: DC
  Administered 2024-02-04: 3 [IU] via SUBCUTANEOUS
  Administered 2024-02-04 (×2): 2 [IU] via SUBCUTANEOUS
  Administered 2024-02-05: 7 [IU] via SUBCUTANEOUS
  Administered 2024-02-05: 2 [IU] via SUBCUTANEOUS
  Filled 2024-02-03 (×5): qty 1

## 2024-02-03 NOTE — Progress Notes (Signed)
 Telemetry reviewed, afib rates had done well overnight however uptrend this morning. Will dose IV lopressor  2.5mg  x 1. We increased her lopressor  to 75mg  bid yesterday, got oral dose this morning, follow rates. Essentially permanent afib with rate controlled at home, rates elevated in setting of UTI. Rates should improve as systemic illness resolves.    Dorn Ross MD

## 2024-02-03 NOTE — Plan of Care (Signed)
   Problem: Activity: Goal: Risk for activity intolerance will decrease Outcome: Progressing   Problem: Coping: Goal: Level of anxiety will decrease Outcome: Progressing

## 2024-02-03 NOTE — Plan of Care (Signed)

## 2024-02-03 NOTE — Progress Notes (Signed)
 1559: RN made Dr Dana aware of the following: At 1110 patient was given Novolog  8 units for CBG 279. At 1512 became diaphoretic, and CBG was 57, given OJ and at 1537 CBG 102 , feeling better no longer diaphoretic. Patient took Novolog  70/30, 30 units this am and is scheduled to take another 10 units at dinner. Patient ate well for breakfast, however ate less for lunch d/t being sleepy. RN asked MD if would like patient to receive the scheduled novolog  70/30 (10 units) as scheduled now. RN also made Dr Dana aware that lasix  20 mg po was given at breakfast and now lasix  20 mg po is scheduled at 1600, RN asked if would like the lasix  20 mg po given now as scheduled.  Also made Dr Dana aware that son Dominique (number in chart) would like an update when able.   MD advised to hold the lasix  20 mg po and give the novolog  70/30 (10 units) as ordered. MD changed the SSI to sensitive scale going forward.

## 2024-02-03 NOTE — Inpatient Diabetes Management (Signed)
 Inpatient Diabetes Program Recommendations  AACE/ADA: New Consensus Statement on Inpatient Glycemic Control (2015)  Target Ranges:  Prepandial:   less than 140 mg/dL      Peak postprandial:   less than 180 mg/dL (1-2 hours)      Critically ill patients:  140 - 180 mg/dL   Lab Results  Component Value Date   GLUCAP 279 (H) 02/03/2024   HGBA1C 7.2 (H) 12/03/2023    Review of Glycemic Control  Latest Reference Range & Units 02/02/24 11:59 02/02/24 16:36 02/02/24 20:16 02/03/24 07:37 02/03/24 11:10  Glucose-Capillary 70 - 99 mg/dL 837 (H) 858 (H) 811 (H) 226 (H) 279 (H)   Diabetes history: DM  Outpatient Diabetes medications:  FSL 3 70/30-50 units q AM and 70/30-15 units q PM Current orders for Inpatient glycemic control:  Novolog  0-15 units tid with meals and HS Novolog  70/30 30 units q AM and Novolog  70/30 10 units q PM  Inpatient Diabetes Program Recommendations:    May consider increasing Novolog  70/30 to 34 units q AM.   Thanks,  Randall Bullocks, RN, BC-ADM Inpatient Diabetes Coordinator Pager [phone]  (8a-5p)

## 2024-02-03 NOTE — Progress Notes (Addendum)
 PROGRESS NOTE    Misty Edwards  FMW:[id number]  DOB: 11/13/1926  DOA: 01/31/2024 PCP: Duanne Butler DASEN, MD Outpatient Specialists:   Hospital course:  89 y.o. female with medical history significant of coronary artery disease status post CABG, type 2 diabetes mellitus, hyperlipidemia, hypertension, IBS, anxiety who was otherwise well until the last 1 week when she started having generalized weakness, some intermittent confusion as well as dysuria.   She is currently being treated for acute uncomplicated UTI, PT/OT has been consulted. Lives at home with her grandson and his wife.  She also has an aide who comes Monday through Friday.  She may need home health services on discharge, depending on PT/OT recommendations.   Subjective:  Patient feels that she is doing well.  Knows that she is at [facility].  Notes that she lives with her grandson.  Patient is not however oriented to time and has baseline dementia. Denies any chest pain or palpitations.  Objective: Vitals:   02/02/24 1336 02/02/24 2111 02/03/24 0500 02/03/24 0948  BP: 134/81 (!) 124/92 (!) 172/82 (!) 149/56  Pulse: 79 97 93   Resp: (!) 21 20 20    Temp: 97.8 F (36.6 C) 97.9 F (36.6 C) 98.7 F (37.1 C)   TempSrc: Oral Oral Oral   SpO2: 95% 94% 94% 94%  Weight:      Height:        Intake/Output Summary (Last 24 hours) at 02/03/2024 1203 Last data filed at 02/03/2024 0356 Gross per 24 hour  Intake 1199.16 ml  Output --  Net 1199.16 ml   Filed Weights   01/31/24 1246 01/31/24 2204  Weight: 66 kg 67.4 kg     Exam:  General: Patient appears comfortable sitting up in chair eating breakfast in NAD Eyes: sclera anicteric, conjuctiva mild injection bilaterally CVS: S1-S2, regular  Respiratory:  decreased air entry bilaterally secondary to decreased inspiratory effort, rales at bases  GI: NABS, soft, NT  LE: Warm and well-perfused Neuro: Baseline dementia, sensorium is much clearer than  yesterday moving all extremities without difficulty.  Data Reviewed:  Basic Metabolic Panel: Recent Labs  Lab 01/31/24 1456 02/01/24 0354 02/03/24 0403  NA 142 141 139  K 4.5 4.2 3.7  CL 101 103 97*  CO2 30 27 27   GLUCOSE 70 118* 184*  BUN 33* 31* 36*  CREATININE 1.38* 1.30* 1.53*  CALCIUM  9.5 9.2 9.3  MG  --   --  2.4    CBC: Recent Labs  Lab 01/31/24 1456 02/01/24 0354  WBC 7.6 7.1  HGB 14.2 14.2  HCT 43.9 43.1  MCV 91.6 90.4  PLT 166 147*     Scheduled Meds:  apixaban   2.5 mg Oral BID   famotidine   20 mg Oral Daily   FLUoxetine   20 mg Oral Daily   furosemide   20 mg Oral q1600   furosemide   20 mg Oral Q breakfast   insulin  aspart  0-15 Units Subcutaneous TID WC   insulin  aspart  0-5 Units Subcutaneous QHS   insulin  aspart protamine - aspart  10 Units Subcutaneous Q supper   insulin  aspart protamine - aspart  30 Units Subcutaneous Q breakfast   lipase/protease/amylase  24,000 Units Oral QHS   lipase/protease/amylase  48,000 Units Oral TID WC   metoprolol  tartrate  75 mg Oral BID   pantoprazole   40 mg Oral Daily   rosuvastatin   5 mg Oral QHS   Continuous Infusions:  sodium chloride      cefTRIAXone  (ROCEPHIN )  IV 1 g (02/02/24 1517)     Assessment & Plan:   Atrial fibrillation Very much Dr. Alvan is management of A-fib Metoprolol  has been increased to 75 twice daily She has received as needed metoprolol  to keep her heart rate down Patient is asymptomatic She has received gentle hydration of 50 cc an hour for the past 24 hours, will discontinue Echocardiogram is ordered and pending Continue Eliquis   CKD 3B Creatinine is minimally in crease since yesterday, however this is likely close to her baseline.  Acute metabolic encephalopathy--much improved Uncomplicated UTI Anxiety Continue treatment of pansensitive E. coli with ceftriaxone  As needed Haldol ordered, she has only required 1 dose yesterday Continue home doses of fluoxetine   DM2 Blood  sugars still running in the mid 200s despite increasing sliding scale from sensitive to moderate However she had an episode of low blood sugar at 57 after getting 8 units because she did not eat much of her lunch, will go back down to sensitive dose SSI since she is not reliably eating all her meals. May need to tolerate some hyperglycemia to avoid hypoglycemia given inconsistent food intake Continue 70/30 up to 30 units (she is usually on 50 units at home) Continue 70/30 at 10 units at bedtime (she is usually on 15 units at home)  Physical deconditioning POA PT has recommended home health rehab Patient's grandson who is POA does not want patient to go to a nursing home  CAD Hyperlipidemia Continue home meds  GERD Continue PPI   DVT prophylaxis: Eliquis  Code Status: Full Family Communication: Spoke with patient's grandson Ozell Garrison, [place] today at length     Studies: No results found.  Principal Problem:   Urinary tract infection Active Problems:   Acute metabolic encephalopathy   Atrial fibrillation with rapid ventricular response (HCC)     Selenne Coggin Vangie Pike, [facility]  If 7PM-7AM, please contact night-coverage [email or web address]   LOS: 1 day

## 2024-02-03 NOTE — Progress Notes (Signed)
 Occupational Therapy Treatment Patient Details Name: Misty Edwards MRN: [id number] DOB: 10/26/1926 Today's Date: 02/03/2024   History of present illness Misty Edwards is a 89 y.o. female with medical history significant of coronary artery disease status post CABG, type 2 diabetes mellitus, hyperlipidemia, hypertension, IBS, anxiety who was otherwise well until the last 1 week when she started having generalized weakness, some intermittent confusion as well as dysuria.  Patient followed up with her PCP and was prescribed doxycycline  however according to patient's grandson they have not seen any significant improvement in her confusion and therefore came to the emergency room for further management.  Patient denied chest pain, nausea, vomiting, abdominal pain, cough or headache   OT comments  Pt agreeable to OT treatment. Pt alert and oriented, but still seemed mildly confused during conversation with this therapist. Pt noted to urinate on the floor on the way to the [facility]. Pt also reported need for assist to complete peri-care and lower body dressing today. Pt continues to require CGA to min A for transfers as demonstrated by chair to [facility] and back with RW. Pt tolerated B UE strengthening well and was left with a handout for A/ROM of the shoulder. Pt left in the chair with call bell within reach and chair alarm set. Pt will benefit from continued OT in the hospital to increase strength, balance, and endurance for safe ADL's.         If plan is discharge home, recommend the following:  A little help with walking and/or transfers;A little help with bathing/dressing/bathroom;Assistance with cooking/housework;Direct supervision/assist for medications management;Assist for transportation;Help with stairs or ramp for entrance   Equipment Recommendations  None recommended by OT          Precautions / Restrictions Precautions Precautions: Fall Recall of Precautions/Restrictions:  Impaired Restrictions Weight Bearing Restrictions Per Provider Order: No       Mobility Bed Mobility               General bed mobility comments: Pt seated in the chair to start the session.    Transfers Overall transfer level: Needs assistance Equipment used: Rolling walker (2 wheels) Transfers: Sit to/from Stand, Bed to chair/wheelchair/BSC Sit to Stand: Contact guard assist     Step pivot transfers: Contact guard assist, Min assist     General transfer comment: chair to [facility] and back with RW; unsteady     Balance Overall balance assessment: Needs assistance Sitting-balance support: Feet supported, No upper extremity supported Sitting balance-Leahy Scale: Fair Sitting balance - Comments: seated in chair   Standing balance support: Bilateral upper extremity supported, During functional activity, Reliant on assistive device for balance Standing balance-Leahy Scale: Fair Standing balance comment: poor to fair using RW                           ADL either performed or assessed with clinical judgement   ADL Overall ADL's : Needs assistance/impaired                     Lower Body Dressing: Moderate assistance;Sitting/lateral leans Lower Body Dressing Details (indicate cue type and reason): Pt able to doff socks but requried assist to don today while seated in the recliner. Toilet Transfer: Minimal assistance;Rolling walker (2 wheels);Stand-pivot;Contact guard assist;BSC/3in1 Statistician Details (indicate cue type and reason): chair to [facility] and back with RW Toileting- Clothing Manipulation and Hygiene: Maximal assistance;Sit to/from stand Toileting - Architect Details (indicate  cue type and reason): Pt reported needing help for peri-care today after urination. Assisted while standing with the RW.            Extremity/Trunk Assessment Upper Extremity Assessment Upper Extremity Assessment: Generalized weakness                              Communication Communication Communication: No apparent difficulties   Cognition Arousal: Alert Behavior During Therapy: WFL for tasks assessed/performed Cognition: No family/caregiver present to determine baseline             OT - Cognition Comments: Pt fully oriented but still seeming a little confused as noted by not always making sense when in conversation with this therapist.                 Following commands: Intact        Cueing   Cueing Techniques: Verbal cues, Tactile cues  Exercises Exercises: General Upper Extremity General Exercises - Upper Extremity Shoulder Flexion: AROM, Both, 10 reps, Seated Shoulder ABduction: AROM, Both, 10 reps, Seated Shoulder Horizontal ABduction: AROM, 10 reps, Both, Seated (x10 protraction and external rotation as well.)                 Pertinent Vitals/ Pain       Pain Assessment Pain Assessment: No/denies pain                                                          Frequency  Min 2X/week        Progress Toward Goals  OT Goals(current goals can now be found in the care plan section)  Progress towards OT goals: Progressing toward goals  Acute Rehab OT Goals Patient Stated Goal: improve function OT Goal Formulation: With patient Time For Goal Achievement: 02/15/24 Potential to Achieve Goals: Good ADL Goals Pt Will Perform Grooming: with modified independence;standing Pt Will Perform Upper Body Dressing: with modified independence Pt Will Perform Lower Body Dressing: with modified independence Pt Will Transfer to Toilet: with modified independence;ambulating Pt Will Perform Toileting - Clothing Manipulation and hygiene: with modified independence;sitting/lateral leans;sit to/from stand Pt/caregiver will Perform Home Exercise Program: Increased ROM;Increased strength;Right Upper extremity;Left upper extremity;Independently  Plan                                       End of Session Equipment Utilized During Treatment: Gait belt;Rolling walker (2 wheels)  OT Visit Diagnosis: Unsteadiness on feet (R26.81);Muscle weakness (generalized) (M62.81);Other symptoms and signs involving cognitive function   Activity Tolerance Patient tolerated treatment well   Patient Left in chair;with call bell/phone within reach;with chair alarm set   Nurse Communication          Time: 8983-8956 OT Time Calculation (min): 27 min  Charges: OT General Charges $OT Visit: 1 Visit OT Treatments $Self Care/Home Management : 8-22 mins $Therapeutic Exercise: 8-22 mins  Aayansh Codispoti OT, MOT  Jayson Person 02/03/2024, 11:17 AM

## 2024-02-03 NOTE — Progress Notes (Addendum)
 Physical Therapy Treatment Patient Details Name: Misty Edwards MRN: [id number] DOB: 1926-06-01 Today's Date: 02/03/2024   History of Present Illness Misty Edwards is a 89 y.o. female with medical history significant of coronary artery disease status post CABG, type 2 diabetes mellitus, hyperlipidemia, hypertension, IBS, anxiety who was otherwise well until the last 1 week when she started having generalized weakness, some intermittent confusion as well as dysuria.  Patient followed up with her PCP and was prescribed doxycycline however according to patient's grandson they have not seen any significant improvement in her confusion and therefore came to the emergency room for further management.  Patient denied chest pain, nausea, vomiting, abdominal pain, cough or headache    PT Comments  Patient agreeable to PT treatment. Patient was received supine in bed. Required CGA/supervision and verbal cueing for bed mobility. CGA with use of RW for transfer and short ambulation as pt becomes incontinent. Once seated, pt requires assist with cleaning LE and replacing purewick in seated position. Patient completes LE and UE strengthening exercises with verbal cueing and visual demonstration. Left LE HEP print out in room for patient/family.  Pt was confused throughout session but very pleasant. Pt tolerates sitting in chair at end of session, chair alarm set and call button within reach. Patient will benefit from continued skilled physical therapy acutely and in recommended venue in order to address current deficits to improve overall function.     If plan is discharge home, recommend the following: A little help with walking and/or transfers;A little help with bathing/dressing/bathroom;Help with stairs or ramp for entrance;Assist for transportation;Assistance with cooking/housework   Can travel by private vehicle        Equipment Recommendations  None recommended by PT    Recommendations for Other  Services       Precautions / Restrictions Precautions Precautions: Fall Recall of Precautions/Restrictions: Impaired Restrictions Weight Bearing Restrictions Per Provider Order: No     Mobility  Bed Mobility Overal bed mobility: Needs Assistance Bed Mobility: Supine to Sit     Supine to sit: Contact guard, Supervision     General bed mobility comments: Pt required consistent verbal cueing due to AMS and inc time to complete supine to sit transfer, no physical assist this date. HOB was elevated.    Transfers Overall transfer level: Needs assistance Equipment used: Rolling walker (2 wheels) Transfers: Sit to/from Stand, Bed to chair/wheelchair/BSC Sit to Stand: Contact guard assist   Step pivot transfers: Contact guard assist       General transfer comment: CGA during bed to chair transfer with RW, contact due to unsteadiness and general LE weakness, pt becomes incontinent with urine during transfer    Ambulation/Gait Ambulation/Gait assistance: Contact guard assist, Min assist Gait Distance (Feet): 5 Feet Assistive device: Rolling walker (2 wheels) Gait Pattern/deviations: Decreased step length - right, Decreased step length - left, Decreased stride length Gait velocity: decreased     General Gait Details: limited to side steps at bedside due to incontinent and nursing needing to administer medications. overall, pt continues to demonstrate labored movement   Stairs             Wheelchair Mobility     Tilt Bed    Modified Rankin (Stroke Patients Only)       Balance Overall balance assessment: Needs assistance Sitting-balance support: Feet supported, No upper extremity supported Sitting balance-Leahy Scale: Fair Sitting balance - Comments: seated in chair   Standing balance support: Bilateral upper extremity supported, During functional  activity, Reliant on assistive device for balance Standing balance-Leahy Scale: Fair Standing balance comment:  poor to fair using RW                            Communication Communication Communication: No apparent difficulties  Cognition Arousal: Alert Behavior During Therapy: WFL for tasks assessed/performed                             Following commands: Intact      Cueing Cueing Techniques: Verbal cues, Tactile cues, Visual cues  Exercises General Exercises - Upper Extremity Shoulder Flexion: AROM, Strengthening, Both, 10 reps, Seated Elbow Flexion: AROM, Strengthening, Both, 10 reps, Seated General Exercises - Lower Extremity Ankle Circles/Pumps: AROM, Strengthening, Both, 10 reps, Seated Long Arc Quad: AROM, Strengthening, Both, 10 reps, Seated Hip Flexion/Marching: AROM, Strengthening, Both, 10 reps, Seated    General Comments        Pertinent Vitals/Pain Pain Assessment Pain Assessment: No/denies pain    Home Living                          Prior Function            PT Goals (current goals can now be found in the care plan section) Acute Rehab PT Goals Patient Stated Goal: return home with family to assist PT Goal Formulation: With patient Time For Goal Achievement: 02/15/24 Potential to Achieve Goals: Good Progress towards PT goals: Progressing toward goals    Frequency    Min 3X/week      PT Plan      Co-evaluation              AM-PAC PT 6 Clicks Mobility   Outcome Measure  Help needed turning from your back to your side while in a flat bed without using bedrails?: None Help needed moving from lying on your back to sitting on the side of a flat bed without using bedrails?: None Help needed moving to and from a bed to a chair (including a wheelchair)?: A Little Help needed standing up from a chair using your arms (e.g., wheelchair or bedside chair)?: A Little Help needed to walk in hospital room?: A Lot Help needed climbing 3-5 steps with a railing? : A Lot 6 Click Score: 18    End of Session Equipment  Utilized During Treatment: Gait belt Activity Tolerance: Patient tolerated treatment well;Patient limited by fatigue Patient left: in chair;with call bell/phone within reach;with chair alarm set Nurse Communication: Mobility status PT Visit Diagnosis: Other abnormalities of gait and mobility (R26.89);Unsteadiness on feet (R26.81);Muscle weakness (generalized) (M62.81)     Time: 9062-8992 PT Time Calculation (min) (ACUTE ONLY): 30 min  Charges:    $Therapeutic Exercise: 8-22 mins $Therapeutic Activity: 8-22 mins PT General Charges $$ ACUTE PT VISIT: 1 Visit                     2:12 PM, 02/03/24 Leslieann Whisman Powell-Butler, PT, DPT [place] [facility]

## 2024-02-03 NOTE — Progress Notes (Signed)
*  PRELIMINARY RESULTS* Echocardiogram 2D Echocardiogram has been performed.  Misty Edwards 02/03/2024, 1:04 PM

## 2024-02-03 NOTE — Patient Instructions (Signed)

## 2024-02-04 DIAGNOSIS — I4821 Permanent atrial fibrillation: Secondary | ICD-10-CM

## 2024-02-04 DIAGNOSIS — N390 Urinary tract infection, site not specified: Secondary | ICD-10-CM

## 2024-02-04 LAB — GLUCOSE, CAPILLARY
Glucose-Capillary: 116 mg/dL — ABNORMAL HIGH (ref 70–99)
Glucose-Capillary: 128 mg/dL — ABNORMAL HIGH (ref 70–99)
Glucose-Capillary: 157 mg/dL — ABNORMAL HIGH (ref 70–99)
Glucose-Capillary: 200 mg/dL — ABNORMAL HIGH (ref 70–99)
Glucose-Capillary: 217 mg/dL — ABNORMAL HIGH (ref 70–99)
Glucose-Capillary: 244 mg/dL — ABNORMAL HIGH (ref 70–99)

## 2024-02-04 LAB — BASIC METABOLIC PANEL WITH GFR
Anion gap: 12 (ref 5–15)
BUN: 35 mg/dL — ABNORMAL HIGH (ref 8–23)
CO2: 29 mmol/L (ref 22–32)
Calcium: 9.3 mg/dL (ref 8.9–10.3)
Chloride: 99 mmol/L (ref 98–111)
Creatinine, Ser: 1.47 mg/dL — ABNORMAL HIGH (ref 0.44–1.00)
GFR, Estimated: 33 mL/min — ABNORMAL LOW (ref >60)
Glucose, Bld: 101 mg/dL — ABNORMAL HIGH (ref 70–99)
Potassium: 4 mmol/L (ref 3.5–5.1)
Sodium: 140 mmol/L (ref 135–145)

## 2024-02-04 MED ORDER — METOPROLOL SUCCINATE ER 50 MG PO TB24
75.0000 mg | ORAL_TABLET | Freq: Two times a day (BID) | ORAL | Status: DC
  Administered 2024-02-04 – 2024-02-05 (×2): 75 mg via ORAL
  Filled 2024-02-04 (×2): qty 1

## 2024-02-04 MED ORDER — FERROUS SULFATE 325 (65 FE) MG PO TABS
325.0000 mg | ORAL_TABLET | Freq: Every day | ORAL | Status: DC
  Administered 2024-02-05: 325 mg via ORAL
  Filled 2024-02-04: qty 1

## 2024-02-04 MED ORDER — METOPROLOL TARTRATE 5 MG/5ML IV SOLN
2.5000 mg | Freq: Once | INTRAVENOUS | Status: AC
  Administered 2024-02-04: 2.5 mg via INTRAVENOUS
  Filled 2024-02-04: qty 5

## 2024-02-04 MED ORDER — ALBUTEROL SULFATE (2.5 MG/3ML) 0.083% IN NEBU: 3.0000 mL | INHALATION_SOLUTION | Freq: Four times a day (QID) | RESPIRATORY_TRACT | Status: DC | PRN

## 2024-02-04 NOTE — Progress Notes (Signed)
 Occupational Therapy Treatment Patient Details Name: Misty Edwards MRN: [id number] DOB: Jan 12, 1927 Today's Date: 02/04/2024   History of present illness Misty Edwards is a 89 y.o. female with medical history significant of coronary artery disease status post CABG, type 2 diabetes mellitus, hyperlipidemia, hypertension, IBS, anxiety who was otherwise well until the last 1 week when she started having generalized weakness, some intermittent confusion as well as dysuria.  Patient followed up with her PCP and was prescribed doxycycline  however according to patient's grandson they have not seen any significant improvement in her confusion and therefore came to the emergency room for further management.  Patient denied chest pain, nausea, vomiting, abdominal pain, cough or headache   OT comments  Pt agreeable to OT treatment today. Pt followed commands well but still seemed mildly confused based on things she was discussing. Pt able to complete toileting and lower body dressing/bathing with CGA. CGA to min A for sit to stand from toilet due to pt impulsively attempting to stand from the toilet with loss of balance back to the toilet without actually standing. Pt demonstrated x5 reps of consecutive sit to stand with the RW with CGA. Pt left in the chair with chair alarm set and call bell within reach. RN notified that there was some blood in the toilet after pt's urination. Pt also urinated on the floor when attempting to ambulate to the toilet. Pt will benefit from continued OT in the hospital to increase strength, balance, and endurance for safe ADL's.         If plan is discharge home, recommend the following:  A little help with walking and/or transfers;A little help with bathing/dressing/bathroom;Assistance with cooking/housework;Direct supervision/assist for medications management;Assist for transportation;Help with stairs or ramp for entrance   Equipment Recommendations  None recommended by OT           Precautions / Restrictions Precautions Precautions: Fall Recall of Precautions/Restrictions: Impaired Restrictions Weight Bearing Restrictions Per Provider Order: No       Mobility Bed Mobility               General bed mobility comments: Pt seated at the EOB to start the session.    Transfers Overall transfer level: Needs assistance Equipment used: Rolling walker (2 wheels) Transfers: Sit to/from Stand, Bed to chair/wheelchair/BSC Sit to Stand: Contact guard assist, Min assist           General transfer comment: CGA when standing from the recliner; CGA to min A when standing from the toilet. Ambulating after sit to stand with RW.     Balance Overall balance assessment: Needs assistance Sitting-balance support: Feet supported, No upper extremity supported Sitting balance-Leahy Scale: Fair Sitting balance - Comments: seated in chair and on the toilet   Standing balance support: Bilateral upper extremity supported, During functional activity, Reliant on assistive device for balance Standing balance-Leahy Scale: Fair Standing balance comment: poor to fair using RW                           ADL either performed or assessed with clinical judgement   ADL Overall ADL's : Needs assistance/impaired     Grooming: Contact guard assist;Standing;Wash/dry hands Grooming Details (indicate cue type and reason): Able to wash hands standing at the sink with RW available.     Lower Body Bathing: Contact guard assist;Sitting/lateral leans Lower Body Bathing Details (indicate cue type and reason): Washed B LE after urinating some on her feet  during ambulation. Completed while seated on the toilet.     Lower Body Dressing: Contact guard assist;Sitting/lateral leans Lower Body Dressing Details (indicate cue type and reason): Able to doff and don socks seated on the toilet with CGA. Toilet Transfer: Contact guard assist;Minimal assistance;Rolling walker (2  wheels) Toilet Transfer Details (indicate cue type and reason): Mild loss of balance when tryting to stand from the toilet. Pt asked to wait and reset followed by more CGA standing from the toilet. Toileting- Clothing Manipulation and Hygiene: Set up;Sitting/lateral lean;Contact guard assist Toileting - Clothing Manipulation Details (indicate cue type and reason): Pt completed peri-care while seated on the toilet after urination.     Functional mobility during ADLs: Contact guard assist;Rolling walker (2 wheels) General ADL Comments: Pt ambulated to the toilet, sink, then back to recliner.     Communication Communication Communication: No apparent difficulties   Cognition Arousal: Alert Behavior During Therapy: WFL for tasks assessed/performed Cognition: No family/caregiver present to determine baseline             OT - Cognition Comments: Pt seemingly confused but able to follow commands well.                 Following commands: Intact        Cueing   Cueing Techniques: Verbal cues, Tactile cues  Exercises Exercises: Other exercises Other Exercises Other Exercises: x5 reps of consecutive sit to stand without rest breaks from the chair. Good pace with RW and CGA.           General Comments Continues to demonstrate bladder incontinence.    Pertinent Vitals/ Pain       Pain Assessment Pain Assessment: No/denies pain                                                          Frequency  Min 2X/week        Progress Toward Goals  OT Goals(current goals can now be found in the care plan section)  Progress towards OT goals: Progressing toward goals  Acute Rehab OT Goals Patient Stated Goal: improve function OT Goal Formulation: With patient Time For Goal Achievement: 02/15/24 Potential to Achieve Goals: Good ADL Goals Pt Will Perform Grooming: with modified independence;standing Pt Will Perform Upper Body Dressing: with modified  independence Pt Will Perform Lower Body Dressing: with modified independence Pt Will Transfer to Toilet: with modified independence;ambulating Pt Will Perform Toileting - Clothing Manipulation and hygiene: with modified independence;sitting/lateral leans;sit to/from stand Pt/caregiver will Perform Home Exercise Program: Increased ROM;Increased strength;Right Upper extremity;Left upper extremity;Independently  Plan                                      End of Session Equipment Utilized During Treatment: Gait belt;Rolling walker (2 wheels)  OT Visit Diagnosis: Unsteadiness on feet (R26.81);Muscle weakness (generalized) (M62.81);Other symptoms and signs involving cognitive function   Activity Tolerance Patient tolerated treatment well   Patient Left in chair;with call bell/phone within reach;with chair alarm set   Nurse Communication          Time: 1341-1356 OT Time Calculation (min): 15 min  Charges: OT General Charges $OT Visit: 1 Visit OT Treatments $Self Care/Home Management : 8-22 mins  Najla Aughenbaugh OT, MOT  Jayson Person 02/04/2024, 3:23 PM

## 2024-02-04 NOTE — Progress Notes (Signed)
 Telemetry reviewed, rates primarily controlled most of the day yesterday and overnight however elevated rates this AM starting around 7AM. Same pattern yesterday. Will transition her to toprol  75mg  bid as opposed to lopressor , perhaps the longer acting will prevent the bump in rates that occur just prior to her AM meds. Write for IV lopressor  2.5mg  x 1 now as well. Essentially permanent afib with rate controlled at home, rates elevated in setting of UTI. Rates should improve as systemic illness resolves.    Dorn Ross MD

## 2024-02-04 NOTE — Inpatient Diabetes Management (Signed)
 Inpatient Diabetes Program Recommendations  AACE/ADA: New Consensus Statement on Inpatient Glycemic Control   Target Ranges:  Prepandial:   less than 140 mg/dL      Peak postprandial:   less than 180 mg/dL (1-2 hours)      Critically ill patients:  140 - 180 mg/dL    Latest Reference Range & Units 02/03/24 07:37 02/03/24 11:10 02/03/24 15:12 02/03/24 15:37 02/03/24 20:07 02/03/24 23:01 02/04/24 00:35  Glucose-Capillary 70 - 99 mg/dL 773 (H) 720 (H) 57 (L) 102 (H) 185 (H) 95 128 (H)   Review of Glycemic Control  Diabetes history: DM2 Outpatient Diabetes medications: 70/30 50 units QAM, 70/30 15 units QPM Current orders for Inpatient glycemic control: 70/30 30 units QAM, 70/30 10 units QPM, Novolog  0-9 units TID with meals, Novolog  0-5 units QHS  Inpatient Diabetes Program Recommendations:    Insulin : Noted CBG down to 57 mg/dl at 84:87 on 88/80. Hypoglycemia likely from Novolog  correction (received 8 units at 12:28). Noted Novolog  correction scale decreased from Moderate to Sensitive.   Thanks, Earnie Gainer, RN, MSN, CDCES Diabetes Coordinator Inpatient Diabetes Program [phone] (Team Pager from 8am to 5pm)

## 2024-02-04 NOTE — Progress Notes (Signed)
 Called grandson to update, no answer and voicemail full. JV

## 2024-02-04 NOTE — Progress Notes (Addendum)
 PROGRESS NOTE    Misty Edwards  FMW:[id number]  DOB: 1927-01-04  DOA: 01/31/2024 PCP: Duanne Butler DASEN, MD    Hospital course:  89 y.o. female with medical history significant of coronary artery disease status post CABG, type 2 diabetes mellitus, hyperlipidemia, hypertension, IBS, anxiety who was otherwise well until the last 1 week when she started having generalized weakness, some intermittent confusion as well as dysuria.   She is currently being treated for acute uncomplicated UTI, PT/OT has been consulted. Lives at home with her grandson and his wife.  She also has an aide who comes Monday through Friday. Family does not want SNF per TOC.    Subjective:  Everyday feels better-- anxious about her grandson being called Had episode of rapid HR this AM, similar to prior per cards-- medications adjsuted  Objective: Vitals:   02/03/24 0948 02/03/24 1300 02/03/24 1946 02/04/24 0448  BP: (!) 149/56 (!) 141/61 (!) 162/89 (!) 158/75  Pulse:  85 86 87  Resp:  16 16 16   Temp:  97.9 F (36.6 C) 98.7 F (37.1 C) 98.9 F (37.2 C)  TempSrc:  Oral    SpO2: 94% 94% 96% 94%  Weight:      Height:        Intake/Output Summary (Last 24 hours) at 02/04/2024 1118 Last data filed at 02/04/2024 0900 Gross per 24 hour  Intake 1100 ml  Output --  Net 1100 ml   Filed Weights   01/31/24 1246 01/31/24 2204  Weight: 66 kg 67.4 kg     Exam:  General: Appearance:     Overweight female in no acute distress- sitting in chair     Lungs:     respirations unlabored  Heart:    Normal heart rate. Irregularly irregular rhythm.    MS:   All extremities are intact.   Neurologic:   Awake=pleasant and cooperative     Data Reviewed:  Basic Metabolic Panel: Recent Labs  Lab 01/31/24 1456 02/01/24 0354 02/03/24 0403 02/04/24 0413  NA 142 141 139 140  K 4.5 4.2 3.7 4.0  CL 101 103 97* 99  CO2 30 27 27 29   GLUCOSE 70 118* 184* 101*  BUN 33* 31* 36* 35*  CREATININE 1.38* 1.30*  1.53* 1.47*  CALCIUM  9.5 9.2 9.3 9.3  MG  --   --  2.4  --     CBC: Recent Labs  Lab 01/31/24 1456 02/01/24 0354  WBC 7.6 7.1  HGB 14.2 14.2  HCT 43.9 43.1  MCV 91.6 90.4  PLT 166 147*     Scheduled Meds:  apixaban   2.5 mg Oral BID   famotidine   20 mg Oral Daily   FLUoxetine   20 mg Oral Daily   furosemide   20 mg Oral q1600   furosemide   20 mg Oral Q breakfast   insulin  aspart  0-5 Units Subcutaneous QHS   insulin  aspart  0-9 Units Subcutaneous TID WC   insulin  aspart protamine - aspart  10 Units Subcutaneous Q supper   insulin  aspart protamine - aspart  30 Units Subcutaneous Q breakfast   lipase/protease/amylase  24,000 Units Oral QHS   lipase/protease/amylase  48,000 Units Oral TID WC   metoprolol  succinate  75 mg Oral BID   pantoprazole   40 mg Oral Daily   rosuvastatin   5 mg Oral QHS   Continuous Infusions:  cefTRIAXone  (ROCEPHIN )  IV Stopped (02/04/24 0040)     Assessment & Plan:   Atrial fibrillation -cards consulted-- transition her to toprol   75mg  bid as opposed to lopressor , perhaps the longer acting will prevent the bump in rates that occur just prior to her AM meds.  Echocardiogram:Left ventricular ejection fraction, by estimation, is 60 to 65%. The  left ventricle has normal function. The left ventricle has no regional  wall motion abnormalities. Left ventricular diastolic parameters are  indeterminate.  Continue Eliquis  -TSH pending for the AM  CKD 3B -stable  Acute metabolic encephalopathy--much improved Uncomplicated UTI Anxiety Continue treatment of pansensitive E. coli with ceftriaxone  As needed Haldol ordered, she has only required 1 dose yesterday Continue home doses of fluoxetine   DM2 - episode of low blood sugar at 57 after getting 8 units because she did not eat much of her lunch, back down to sensitive dose SSI since she is not reliably eating all her meals. Continue 70/30 up to 30 units (she is usually on 50 units at home) Continue  70/30 at 10 units at bedtime (she is usually on 15 units at home)  Physical deconditioning POA PT has recommended home health rehab Patient's grandson who is POA does not want patient to go to a nursing home  CAD Hyperlipidemia Continue home meds  GERD Continue PPI   DVT prophylaxis: Eliquis  Code Status: Full Family Communication: called grandson, no answer     Studies: ECHOCARDIOGRAM COMPLETE Result Date: 02/03/2024    ECHOCARDIOGRAM REPORT   Patient Name:   Misty Edwards Date of Exam: 02/03/2024 Medical Rec #:  [id number]       Height:       63.0 in Accession #:    [id number]      Weight:       148.6 lb Date of Birth:  [birth year]       BSA:          1.704 m Patient Age:    89 years        BP:           149/56 mmHg Patient Gender: F               HR:           93 bpm. Exam Location:  [facility] Procedure: 2D Echo, Cardiac Doppler and Color Doppler (Both Spectral and Color            Flow Doppler were utilized during procedure). Indications:    Atrial Fibrillation I48.91  History:        Patient has prior history of Echocardiogram examinations, most                 recent 03/02/2022. CHF, CAD and Previous Myocardial Infarction,                 Prior CABG, Arrythmias:Atrial Fibrillation; Risk                 Factors:Hypertension, Diabetes and Dyslipidemia. Hx of                 Disorientation.  Sonographer:    Aida Pizza RCS Referring Phys: [id number] SROBONA TUBLU CHATTERJEE IMPRESSIONS  1. Left ventricular ejection fraction, by estimation, is 60 to 65%. The left ventricle has normal function. The left ventricle has no regional wall motion abnormalities. Left ventricular diastolic parameters are indeterminate.  2. Right ventricular systolic function is normal. The right ventricular size is normal. There is moderately elevated pulmonary artery systolic pressure.  3. Left atrial size was mildly dilated.  4. The mitral valve is abnormal. Moderate mitral valve regurgitation. No  evidence of  mitral stenosis.  5. The tricuspid valve is abnormal.  6. The aortic valve is tricuspid. There is mild calcification of the aortic valve. There is mild thickening of the aortic valve. Aortic valve regurgitation is moderate.  7. IVC is small suggesting low RA pressure and hypovolemia. FINDINGS  Left Ventricle: Left ventricular ejection fraction, by estimation, is 60 to 65%. The left ventricle has normal function. The left ventricle has no regional wall motion abnormalities. The left ventricular internal cavity size was normal in size. There is  no left ventricular hypertrophy. Left ventricular diastolic parameters are indeterminate. Right Ventricle: The right ventricular size is normal. Right vetricular wall thickness was not well visualized. Right ventricular systolic function is normal. There is moderately elevated pulmonary artery systolic pressure. The tricuspid regurgitant velocity is 3.71 m/s, and with an assumed right atrial pressure of 3 mmHg, the estimated right ventricular systolic pressure is 58.1 mmHg. Left Atrium: Left atrial size was mildly dilated. Right Atrium: Right atrial size was normal in size. Pericardium: There is no evidence of pericardial effusion. Mitral Valve: The mitral valve is abnormal. Moderate mitral valve regurgitation. No evidence of mitral valve stenosis. Tricuspid Valve: The tricuspid valve is abnormal. Tricuspid valve regurgitation is mild . No evidence of tricuspid stenosis. Aortic Valve: The aortic valve is tricuspid. There is mild calcification of the aortic valve. There is mild thickening of the aortic valve. There is mild aortic valve annular calcification. Aortic valve regurgitation is moderate. Aortic regurgitation PHT  measures 479 msec. Aortic valve mean gradient measures 3.1 mmHg. Aortic valve peak gradient measures 6.5 mmHg. Aortic valve area, by VTI measures 1.71 cm. Pulmonic Valve: The pulmonic valve was not well visualized. Pulmonic valve regurgitation is mild to  moderate. No evidence of pulmonic stenosis. Aorta: The aortic root is normal in size and structure. Venous: IVC is small suggesting low RA pressure and hypovolemia. IAS/Shunts: No atrial level shunt detected by color flow Doppler.  LEFT VENTRICLE PLAX 2D LVIDd:         4.00 cm LVIDs:         2.60 cm LV PW:         1.00 cm LV IVS:        1.10 cm LVOT diam:     1.80 cm LV SV:         39 LV SV Index:   23 LVOT Area:     2.54 cm  RIGHT VENTRICLE RV S prime:     9.95 cm/s TAPSE (M-mode): 1.3 cm LEFT ATRIUM             Index        RIGHT ATRIUM           Index LA diam:        4.40 cm 2.58 cm/m   RA Area:     15.80 cm LA Vol (A2C):   69.5 ml 40.78 ml/m  RA Volume:   34.50 ml  20.24 ml/m LA Vol (A4C):   58.2 ml 34.15 ml/m LA Biplane Vol: 63.7 ml 37.38 ml/m  AORTIC VALVE AV Area (Vmax):    1.59 cm AV Area (Vmean):   1.89 cm AV Area (VTI):     1.71 cm AV Vmax:           127.44 cm/s AV Vmean:          83.079 cm/s AV VTI:            0.228 m AV Peak Grad:  6.5 mmHg AV Mean Grad:      3.1 mmHg LVOT Vmax:         79.80 cm/s LVOT Vmean:        61.600 cm/s LVOT VTI:          0.153 m LVOT/AV VTI ratio: 0.67 AI PHT:            479 msec  AORTA Ao Root diam: 2.70 cm MITRAL VALVE                  TRICUSPID VALVE MV Area (PHT): 6.54 cm       TR Peak grad:   55.1 mmHg MV Decel Time: 116 msec       TR Vmax:        371.00 cm/s MR Peak grad:    116.6 mmHg MR Mean grad:    80.0 mmHg    SHUNTS MR Vmax:         540.00 cm/s  Systemic VTI:  0.15 m MR Vmean:        418.0 cm/s   Systemic Diam: 1.80 cm MR PISA:         1.57 cm MR PISA Eff ROA: 6 mm MR PISA Radius:  0.50 cm MV E velocity: 132.00 cm/s MV A velocity: 38.30 cm/s MV E/A ratio:  3.45 Dorn Ross MD Electronically signed by Dorn Ross MD Signature Date/Time: 02/03/2024/4:27:20 PM    Final        Harlene RAYMOND Bowl [facility]  If 7PM-7AM, please contact night-coverage [email or web address]   LOS: 2 days

## 2024-02-04 NOTE — Telephone Encounter (Signed)
 Too soon for refill.  Requested Prescriptions  Pending Prescriptions Disp Refills   pantoprazole  (PROTONIX ) 40 MG tablet [Pharmacy Med Name: PANTOPRAZOLE  DR 40MG  TAB 40 Tablet] 90 tablet 11    Sig: TAKE 1 TABLET BY MOUTH DAILY     Gastroenterology: Proton Pump Inhibitors Passed - 02/04/2024  1:33 PM      Passed - Valid encounter within last 12 months    Recent Outpatient Visits           2 months ago Type 2 diabetes mellitus with other specified complication, with long-term current use of insulin  [facility])   [place] [facility] Pickard, Butler DASEN, MD   9 months ago Upper respiratory infection, viral   [place] [facility] Aletha Bene, MD   9 months ago Type 2 diabetes mellitus with other specified complication, with long-term current use of insulin  [facility])   [place] [facility] Medicine Pickard, Butler DASEN, MD   11 months ago Flu vaccine need   [facility] Duanne Butler DASEN, MD   1 year ago Type 2 diabetes mellitus with other specified complication, with long-term current use of insulin  [facility])   [place] [facility] Pickard, Butler DASEN, MD       Future Appointments             In 2 months Branch, Dorn FALCON, MD [facility], [facility]

## 2024-02-04 NOTE — Plan of Care (Signed)
   Problem: Education: Goal: Knowledge of General Education information will improve Description Including pain rating scale, medication(s)/side effects and non-pharmacologic comfort measures Outcome: Progressing

## 2024-02-04 NOTE — Progress Notes (Signed)
 Mobility Specialist Progress Note:    02/04/24 1000  Mobility  Activity Ambulated with assistance  Level of Assistance Contact guard assist, steadying assist  Assistive Device Front wheel walker  Distance Ambulated (ft) 40 ft  Range of Motion/Exercises Active;All extremities  Activity Response Tolerated well  Mobility Referral Yes  Mobility visit 1 Mobility  Mobility Specialist Start Time (ACUTE ONLY) 1000  Mobility Specialist Stop Time (ACUTE ONLY) 1020  Mobility Specialist Time Calculation (min) (ACUTE ONLY) 20 min   Pt received in bed, agreeable to mobility. Required CGA to stand and ambulate with RW. Tolerated well, slightly confused. Returned to chair, alarm on. Call bell in reach, all needs met.  Arlayne Liggins Mobility Specialist Please contact via Special Educational Needs Teacher or  Rehab office at [phone]

## 2024-02-05 DIAGNOSIS — I4891 Unspecified atrial fibrillation: Secondary | ICD-10-CM | POA: Diagnosis not present

## 2024-02-05 DIAGNOSIS — N3000 Acute cystitis without hematuria: Secondary | ICD-10-CM | POA: Diagnosis not present

## 2024-02-05 DIAGNOSIS — G9341 Metabolic encephalopathy: Secondary | ICD-10-CM | POA: Diagnosis not present

## 2024-02-05 LAB — URINALYSIS, ROUTINE W REFLEX MICROSCOPIC
Bilirubin Urine: NEGATIVE
Glucose, UA: 150 mg/dL — AB
Hgb urine dipstick: NEGATIVE
Ketones, ur: NEGATIVE mg/dL
Leukocytes,Ua: NEGATIVE
Nitrite: NEGATIVE
Protein, ur: NEGATIVE mg/dL
Specific Gravity, Urine: 1.012 (ref 1.005–1.030)
pH: 5 (ref 5.0–8.0)

## 2024-02-05 LAB — TSH: TSH: 0.787 u[IU]/mL (ref 0.350–4.500)

## 2024-02-05 LAB — GLUCOSE, CAPILLARY
Glucose-Capillary: 197 mg/dL — ABNORMAL HIGH (ref 70–99)
Glucose-Capillary: 321 mg/dL — ABNORMAL HIGH (ref 70–99)

## 2024-02-05 MED ORDER — METOPROLOL SUCCINATE ER 25 MG PO TB24: 75.0000 mg | ORAL_TABLET | Freq: Two times a day (BID) | ORAL | 1 refills | Status: AC

## 2024-02-05 NOTE — Progress Notes (Signed)
 Telemetry reviewed,afib rates are controlled. Yesterday changed to toprol  72m bid, she was getting high heart rates in early AM just before her AM meds. So far doing better with long acting metoprolol . No additional recs at this time, can call us  back if recurrent issues with afib and high rates   Dorn Ross MD

## 2024-02-05 NOTE — TOC Transition Note (Signed)
 Transition of Care [facility]) - Discharge Note   Patient Details  Name: Misty Edwards MRN: [id number] Date of Birth: 1927/01/27  [facility] CM/SW Contact:  Hoy DELENA Bigness, LCSW Phone Number: 02/05/2024, 10:01 AM   Clinical Narrative:    Pt to return home with family. Pt to receive HHPT through [facility]. HH orders are in place. No further ICM needs identified.    Final next level of care: Home w Home Health Services Barriers to Discharge: Barriers Resolved   Patient Goals and CMS Choice Patient states their goals for this hospitalization and ongoing recovery are:: return back home CMS Medicare.gov Compare Post Acute Care list provided to:: Patient Represenative (must comment) (Grandson) Choice offered to / list presented to :  [place])      Discharge Placement                       Discharge Plan and Services Additional resources added to the After Visit Summary for     Discharge Planning Services: CM Consult Post Acute Care Choice: Durable Medical Equipment          DME Arranged: N/A DME Agency: NA       HH Arranged: PT HH Agency: [facility]        Social Drivers of Health (SDOH) Interventions SDOH Screenings   Food Insecurity: No Food Insecurity (01/31/2024)  Housing: Low Risk  (01/31/2024)  Transportation Needs: No Transportation Needs (01/31/2024)  Utilities: Not At Risk (01/31/2024)  Depression (PHQ2-9): Medium Risk (01/26/2024)  Social Connections: Moderately Isolated (01/31/2024)  Tobacco Use: Low Risk  (01/31/2024)     Readmission Risk Interventions    02/05/2024   10:00 AM 04/16/2022   11:39 AM 03/05/2022    1:14 PM  Readmission Risk Prevention Plan  Transportation Screening Complete Complete Complete  PCP or Specialist Appt within 5-7 Days   Complete  PCP or Specialist Appt within 3-5 Days Complete    Home Care Screening   Complete  Medication Review (RN CM)   Complete  HRI or Home Care Consult Complete     Social Work Consult for Recovery Care Planning/Counseling Complete    Palliative Care Screening Not Applicable    Medication Review Oceanographer) Complete Complete   PCP or Specialist appointment within 3-5 days of discharge  Not Complete   HRI or Home Care Consult  Complete   SW Recovery Care/Counseling Consult  Complete   Palliative Care Screening  Not Applicable   Skilled Nursing Facility  Not Applicable

## 2024-02-05 NOTE — Progress Notes (Signed)
 Pharmacy - Brief Note  Patient with Pan-susceptible E coli in Urine from on 01/26/24.  A prescription for cephalexin  was sent by ED Provider but patient ended up being admitted and completed 5-day course of ceftriaxone  IV.  Called [facility] pharmacy since cephalexin  as outpatient no longer needed.     Jahbari Repinski, PharmD, BCPS, BCIDP Work Cell: [phone] 02/05/2024 10:55 AM

## 2024-02-05 NOTE — Discharge Summary (Signed)
 Physician Discharge Summary   Patient: Misty Edwards MRN: [id number] DOB: 13-Mar-1927  Admit date:     01/31/2024  Discharge date: 02/05/24  Discharge Physician: Amaryllis Dare   PCP: Duanne Butler DASEN, MD   Recommendations at discharge:  Please obtain CBC and BMP on follow-up Follow-up with cardiology Follow-up with primary care provider  Discharge Diagnoses: Principal Problem:   Urinary tract infection Active Problems:   Acute metabolic encephalopathy   Atrial fibrillation with rapid ventricular response [facility])   Hospital Course: 89 y.o. female with medical history significant of coronary artery disease status post CABG, type 2 diabetes mellitus, hyperlipidemia, hypertension, IBS, anxiety who was otherwise well until the last 1 week when she started having generalized weakness, some intermittent confusion as well as dysuria.   Patient was found to have pansensitive E. coli UTI and completed the course of antibiotic while in the hospital.  Our physical therapist evaluated her recommending rehab at SNF but family declined and would like her to go back home as they have 24/7 care available.  Patient was also found to have RVR with history of atrial fibrillation, cardiology was consulted.  Echocardiogram with normal EF, no regional wall motion abnormalities and indeterminate diastolic parameters.  Her home metoprolol  dose was increased to 75 mg twice daily for better control of heart rate.  She will continue home Eliquis .  TSH was normal. Patient with history of CKD stage IIIb with stable creatinine.  Patient will continue with the rest of her home medications and follow-up with her providers for further assistance.  Consultants: Cardiology Procedures performed: None Disposition: Home health Diet recommendation:  Discharge Diet Orders (From admission, onward)     Start     Ordered   02/05/24 0000  Diet - low sodium heart healthy        02/05/24 1154           Cardiac and  Carb modified diet DISCHARGE MEDICATION: Allergies as of 02/05/2024       Reactions   Morphine  And Codeine Other (See Comments)   Hallucination   Calcium -containing Compounds Nausea Only   Raloxifene  Itching   Evista - Face and eyes burning   Vitamin D Analogs Nausea Only        Medication List     STOP taking these medications    doxycycline  100 MG tablet Commonly known as: VIBRA -TABS   metoprolol  tartrate 50 MG tablet Commonly known as: LOPRESSOR        TAKE these medications    acetaminophen  325 MG tablet Commonly known as: TYLENOL  Take 650 mg by mouth every 6 (six) hours as needed for mild pain (pain score 1-3).   albuterol  108 (90 Base) MCG/ACT inhaler Commonly known as: VENTOLIN  HFA Inhale 1-2 puffs into the lungs every 6 (six) hours as needed for wheezing or shortness of breath.   alum & mag hydroxide-simeth 200-200-20 MG/5ML suspension Commonly known as: MAALOX/MYLANTA Take 30 mLs by mouth every 6 (six) hours as needed for indigestion or heartburn.   Blood Glucose System Pak Kit Please dispense as One Touch Ultra. Use as directed to monitor FSBS 4x daily. Dx: E11.65   calcium  carbonate 500 MG chewable tablet Commonly known as: TUMS - dosed in mg elemental calcium  Chew 3 tablets by mouth as needed for heartburn or indigestion.   cetirizine  10 MG tablet Commonly known as: ZYRTEC  TAKE 1 TABLET BY MOUTH DAILY   Creon  24000-76000 units Cpep Generic drug: Pancrelipase  (Lip-Prot-Amyl) Take 1 capsule (24,000 Units total) by mouth  See admin instructions. TAKE 2 CAPSULES BY MOUTH WITH MEALS AND 1 CAP WITH SNACKS x 2   Eliquis  2.5 MG Tabs tablet Generic drug: apixaban  TAKE 1 TABLET BY MOUTH TWICE DAILY   estradiol  0.1 MG/GM vaginal cream Commonly known as: ESTRACE  VAGINAL Place 1 Applicatorful vaginally at bedtime.   famotidine  40 MG tablet Commonly known as: PEPCID  Take 1 tablet (40 mg total) by mouth daily.   ferrous sulfate  325 (65 FE) MG  tablet Take 325 mg by mouth daily with breakfast.   FLUoxetine  20 MG capsule Commonly known as: PROZAC  TAKE 1 CAPSULE BY MOUTH ONCE DAILY   FreeStyle Libre 3 Reader Devi 1 Device by Does not apply route daily. Use to continuously check blood sugar daily.   FreeStyle Libre 3 Sensor Misc USE TO CHECK BLOOD GLUCOSE LEVELS CONTINUOUSLY AND CHANGE EVERY 14 DAYS   furosemide  20 MG tablet Commonly known as: Lasix  Take 1 tablet (20 mg total) by mouth See admin instructions. Take 40 mg in the morning and 20 mg at 4 pm.   insulin  aspart protamine - aspart (70-30) 100 UNIT/ML injection Commonly known as: NovoLOG  Mix 70/30 ReliOn 50 units in am, 8 units in pm What changed:  how to take this when to take this additional instructions   Insulin  Syringe-Needle U-100 31G X 5/16 0.5 ML Misc Commonly known as: BD Insulin  Syringe U/F USE AS DIRECTED TO ADMINISTER INSULIN  TWICE DAILY   Insulin  Syringes (Disposable) U-100 1 ML Misc Use as directed to administer insulin  daily.   Lancets Misc. Misc Pt has Accu Chek Aviva Plus - Needs just lancets  Checks BS 4-5 times per day. Disp 3 boxes(90 day Supply)/4 refills Dx code. E11.9   LORazepam  1 MG tablet Commonly known as: ATIVAN  TAKE 1/2 TABLET BY MOUTH AT BEDTIME   magnesium  oxide 400 MG tablet Commonly known as: MAG-OX Take 1 tablet (400 mg total) by mouth daily.   metoprolol  succinate 25 MG 24 hr tablet Commonly known as: TOPROL -XL Take 3 tablets (75 mg total) by mouth 2 (two) times daily.   multivitamin capsule Take 1 capsule by mouth every morning.   nystatin  cream Commonly known as: MYCOSTATIN  Apply 1 Application topically 3 (three) times daily.   ondansetron  4 MG tablet Commonly known as: Zofran  Take 1 tablet (4 mg total) by mouth every 8 (eight) hours as needed for nausea or vomiting.   OneTouch Delica Lancets 33G Misc CHECK BLOOD SUGAR 4-5 TIMES PER DAY   OneTouch Ultra test strip Generic drug: glucose blood USE 1 STRIP  TO CHECK GLUCOSE FIVE TIMES DAILY   pantoprazole  40 MG tablet Commonly known as: PROTONIX  Take 1 tablet (40 mg total) by mouth daily.   potassium chloride  10 MEQ tablet Commonly known as: KLOR-CON  TAKE 1 TABLET BY MOUTH DAILY   rosuvastatin  5 MG tablet Commonly known as: CRESTOR  TAKE 1 TABLET BY MOUTH EACH EVENING   SOOTHE OP Apply 2 drops to eye 2 (two) times daily.   triamcinolone  cream 0.1 % Commonly known as: KENALOG  Apply topically 2 (two) times daily.        Contact information for follow-up providers     Duanne Butler DASEN, MD. Schedule an appointment as soon as possible for a visit in 3 days.   Specialty: Family Medicine Contact information: [address] [place] [address] [phone]         [facility] Emergency Department at [facility]. Go to .   Specialty: Emergency Medicine Why: As  needed, If symptoms worsen Contact information: [address] [place]  [address] [phone]             Contact information for after-discharge care     Home Medical Care     [facility]) .   Service: Home Health Services Contact information: [address] [place]  [address] [phone]                    Discharge Exam: Fredricka Weights   01/31/24 1246 01/31/24 2204  Weight: 66 kg 67.4 kg   General.  Frail and pleasantly confused elderly lady, in no acute distress. Pulmonary.  Lungs clear bilaterally, normal respiratory effort. CV.  Irregularly irregular Abdomen.  Soft, nontender, nondistended, BS positive. CNS.  Alert and oriented to self.  No focal neurologic deficit. Extremities.  No edema, pulses intact and symmetrical.  Condition at discharge: stable  The results of significant diagnostics from this hospitalization (including imaging, microbiology, ancillary and laboratory) are listed below for reference.   Imaging Studies: ECHOCARDIOGRAM  COMPLETE Result Date: 02/03/2024    ECHOCARDIOGRAM REPORT   Patient Name:   LYNEA ROLLISON Date of Exam: 02/03/2024 Medical Rec #:  [id number]       Height:       63.0 in Accession #:    [id number]      Weight:       148.6 lb Date of Birth:  1926/03/31       BSA:          1.704 m Patient Age:    89 years        BP:           149/56 mmHg Patient Gender: F               HR:           93 bpm. Exam Location:  [facility] Procedure: 2D Echo, Cardiac Doppler and Color Doppler (Both Spectral and Color            Flow Doppler were utilized during procedure). Indications:    Atrial Fibrillation I48.91  History:        Patient has prior history of Echocardiogram examinations, most                 recent 03/02/2022. CHF, CAD and Previous Myocardial Infarction,                 Prior CABG, Arrythmias:Atrial Fibrillation; Risk                 Factors:Hypertension, Diabetes and Dyslipidemia. Hx of                 Disorientation.  Sonographer:    Aida Pizza RCS Referring Phys: [id number] SROBONA TUBLU CHATTERJEE IMPRESSIONS  1. Left ventricular ejection fraction, by estimation, is 60 to 65%. The left ventricle has normal function. The left ventricle has no regional wall motion abnormalities. Left ventricular diastolic parameters are indeterminate.  2. Right ventricular systolic function is normal. The right ventricular size is normal. There is moderately elevated pulmonary artery systolic pressure.  3. Left atrial size was mildly dilated.  4. The mitral valve is abnormal. Moderate mitral valve regurgitation. No evidence of mitral stenosis.  5. The tricuspid valve is abnormal.  6. The aortic valve is tricuspid. There is mild calcification of the aortic valve. There is mild thickening of the aortic valve. Aortic valve regurgitation is moderate.  7. IVC is small suggesting low RA pressure and hypovolemia. FINDINGS  Left Ventricle: Left ventricular ejection fraction, by estimation, is 60 to 65%. The left ventricle has normal function.  The left ventricle has no regional wall motion abnormalities. The left ventricular internal cavity size was normal in size. There is  no left ventricular hypertrophy. Left ventricular diastolic parameters are indeterminate. Right Ventricle: The right ventricular size is normal. Right vetricular wall thickness was not well visualized. Right ventricular systolic function is normal. There is moderately elevated pulmonary artery systolic pressure. The tricuspid regurgitant velocity is 3.71 m/s, and with an assumed right atrial pressure of 3 mmHg, the estimated right ventricular systolic pressure is 58.1 mmHg. Left Atrium: Left atrial size was mildly dilated. Right Atrium: Right atrial size was normal in size. Pericardium: There is no evidence of pericardial effusion. Mitral Valve: The mitral valve is abnormal. Moderate mitral valve regurgitation. No evidence of mitral valve stenosis. Tricuspid Valve: The tricuspid valve is abnormal. Tricuspid valve regurgitation is mild . No evidence of tricuspid stenosis. Aortic Valve: The aortic valve is tricuspid. There is mild calcification of the aortic valve. There is mild thickening of the aortic valve. There is mild aortic valve annular calcification. Aortic valve regurgitation is moderate. Aortic regurgitation PHT  measures 479 msec. Aortic valve mean gradient measures 3.1 mmHg. Aortic valve peak gradient measures 6.5 mmHg. Aortic valve area, by VTI measures 1.71 cm. Pulmonic Valve: The pulmonic valve was not well visualized. Pulmonic valve regurgitation is mild to moderate. No evidence of pulmonic stenosis. Aorta: The aortic root is normal in size and structure. Venous: IVC is small suggesting low RA pressure and hypovolemia. IAS/Shunts: No atrial level shunt detected by color flow Doppler.  LEFT VENTRICLE PLAX 2D LVIDd:         4.00 cm LVIDs:         2.60 cm LV PW:         1.00 cm LV IVS:        1.10 cm LVOT diam:     1.80 cm LV SV:         39 LV SV Index:   23 LVOT Area:      2.54 cm  RIGHT VENTRICLE RV S prime:     9.95 cm/s TAPSE (M-mode): 1.3 cm LEFT ATRIUM             Index        RIGHT ATRIUM           Index LA diam:        4.40 cm 2.58 cm/m   RA Area:     15.80 cm LA Vol (A2C):   69.5 ml 40.78 ml/m  RA Volume:   34.50 ml  20.24 ml/m LA Vol (A4C):   58.2 ml 34.15 ml/m LA Biplane Vol: 63.7 ml 37.38 ml/m  AORTIC VALVE AV Area (Vmax):    1.59 cm AV Area (Vmean):   1.89 cm AV Area (VTI):     1.71 cm AV Vmax:           127.44 cm/s AV Vmean:          83.079 cm/s AV VTI:            0.228 m AV Peak Grad:      6.5 mmHg AV Mean Grad:      3.1 mmHg LVOT Vmax:         79.80 cm/s LVOT Vmean:        61.600 cm/s LVOT  VTI:          0.153 m LVOT/AV VTI ratio: 0.67 AI PHT:            479 msec  AORTA Ao Root diam: 2.70 cm MITRAL VALVE                  TRICUSPID VALVE MV Area (PHT): 6.54 cm       TR Peak grad:   55.1 mmHg MV Decel Time: 116 msec       TR Vmax:        371.00 cm/s MR Peak grad:    116.6 mmHg MR Mean grad:    80.0 mmHg    SHUNTS MR Vmax:         540.00 cm/s  Systemic VTI:  0.15 m MR Vmean:        418.0 cm/s   Systemic Diam: 1.80 cm MR PISA:         1.57 cm MR PISA Eff ROA: 6 mm MR PISA Radius:  0.50 cm MV E velocity: 132.00 cm/s MV A velocity: 38.30 cm/s MV E/A ratio:  3.45 Dorn Ross MD Electronically signed by Dorn Ross MD Signature Date/Time: 02/03/2024/4:27:20 PM    Final     Microbiology: Results for orders placed or performed in visit on 01/26/24  Urine Culture     Status: Abnormal   Collection Time: 01/26/24  3:54 PM   Specimen: Urine   UR  Result Value Ref Range Status   Urine Culture, Routine Final report (A)  Final   Organism ID, Bacteria Escherichia coli (A)  Final    Comment: Cefazolin  with an MIC <=16 predicts susceptibility to the oral agents cefaclor, cefdinir , cefpodoxime, cefprozil, cefuroxime , cephalexin , and loracarbef when used for therapy of uncomplicated urinary tract infections due to E. coli, Klebsiella pneumoniae, and  Proteus mirabilis. Greater than 100,000 colony forming units per mL    ORGANISM ID, BACTERIA Comment  Final    Comment: Mixed urogenital flora 25,000-50,000 colony forming units per mL    Antimicrobial Susceptibility Comment  Final    Comment:       ** S = Susceptible; I = Intermediate; R = Resistant **                    P = Positive; N = Negative             MICS are expressed in micrograms per mL    Antibiotic                 RSLT#1    RSLT#2    RSLT#3    RSLT#4 Amoxicillin/Clavulanic Acid    S Ampicillin                      S Cefazolin                       S Cefepime                       S Cefoxitin                      S Cefpodoxime                    S Ceftriaxone                     S Ciprofloxacin   S Ertapenem                      S Gentamicin                     S Levofloxacin                   S Meropenem                      S Nitrofurantoin                 S Piperacillin/Tazobactam        S Tetracycline                   S Tobramycin                     S Trimethoprim/Sulfa             S   Microscopic Examination     Status: Abnormal   Collection Time: 01/26/24  3:54 PM   Urine  Result Value Ref Range Status   WBC, UA 11-30 (A) 0 - 5 /hpf Final   RBC, Urine 0-2 0 - 2 /hpf Final   Epithelial Cells (non renal) 0-10 0 - 10 /hpf Final   Renal Epithel, UA None seen None seen /hpf Final   Casts Present (A) None seen /lpf Final   Cast Type Hyaline casts N/A Final   Bacteria, UA Many (A) None seen/Few Final   Yeast, UA None seen None seen Final    Labs: CBC: Recent Labs  Lab 01/31/24 1456 02/01/24 0354  WBC 7.6 7.1  HGB 14.2 14.2  HCT 43.9 43.1  MCV 91.6 90.4  PLT 166 147*   Basic Metabolic Panel: Recent Labs  Lab 01/31/24 1456 02/01/24 0354 02/03/24 0403 02/04/24 0413  NA 142 141 139 140  K 4.5 4.2 3.7 4.0  CL 101 103 97* 99  CO2 30 27 27 29   GLUCOSE 70 118* 184* 101*  BUN 33* 31* 36* 35*  CREATININE 1.38* 1.30* 1.53* 1.47*   CALCIUM  9.5 9.2 9.3 9.3  MG  --   --  2.4  --    Liver Function Tests: Recent Labs  Lab 01/31/24 1456  AST 47*  ALT 39  ALKPHOS 69  BILITOT 0.9  PROT 7.6  ALBUMIN  4.7   CBG: Recent Labs  Lab 02/04/24 1439 02/04/24 1632 02/04/24 1945 02/05/24 0733 02/05/24 1114  GLUCAP 244* 200* 116* 197* 321*    Discharge time spent: greater than 30 minutes.  This record has been created using Conservation officer, historic buildings. Errors have been sought and corrected,but may not always be located. Such creation errors do not reflect on the standard of care.   Signed: Amaryllis Dare, MD [facility] 02/05/2024

## 2024-02-05 NOTE — Progress Notes (Signed)
 Mobility Specialist Progress Note:    02/05/24 1400  Mobility  Activity Ambulated with assistance  Level of Assistance Contact guard assist, steadying assist  Assistive Device Front wheel walker  Distance Ambulated (ft) 20 ft  Range of Motion/Exercises Active;All extremities  Activity Response Tolerated well  Mobility Referral Yes  Mobility visit 1 Mobility  Mobility Specialist Start Time (ACUTE ONLY) 1400  Mobility Specialist Stop Time (ACUTE ONLY) 1420  Mobility Specialist Time Calculation (min) (ACUTE ONLY) 20 min   Pt received in chair, agreeable to try and use restroom for urine sample. Required CGA to stand and ambulate with RW. Tolerated well, urine incontinence and confusion.  Left supine, alarm on. Family in room, all needs met.  Madylyn Insco Mobility Specialist Please contact via Special Educational Needs Teacher or  Rehab office at [phone]

## 2024-02-05 NOTE — Care Management Important Message (Signed)
 Important Message  Patient Details  Name: Misty Edwards MRN: [id number] Date of Birth: [birth year]   Important Message Given:  Yes - Medicare IM     Shiraz Bastyr L Earsel Shouse 02/05/2024, 12:05 PM

## 2024-02-05 NOTE — Progress Notes (Signed)
 Mobility Specialist Progress Note:    02/05/24 1240  Mobility  Activity Ambulated with assistance  Level of Assistance Contact guard assist, steadying assist  Assistive Device Front wheel walker  Distance Ambulated (ft) 20 ft  Range of Motion/Exercises Active;All extremities  Activity Response Tolerated well  Mobility Referral Yes  Mobility visit 1 Mobility  Mobility Specialist Start Time (ACUTE ONLY) 1240  Mobility Specialist Stop Time (ACUTE ONLY) 1300  Mobility Specialist Time Calculation (min) (ACUTE ONLY) 20 min   Pt received in bed, agreeable to mobility. Required CGA to stand and ambulate with RW. Tolerated well, confused. Left in chair, alarm on. Call bell in reach, all needs met.   Misty Edwards Mobility Specialist Please contact via Special Educational Needs Teacher or  Rehab office at [phone]

## 2024-02-08 ENCOUNTER — Telehealth: Payer: Self-pay | Admitting: *Deleted

## 2024-02-08 ENCOUNTER — Telehealth: Payer: Self-pay | Admitting: Family Medicine

## 2024-02-08 ENCOUNTER — Ambulatory Visit: Payer: Self-pay

## 2024-02-08 NOTE — Telephone Encounter (Signed)
 FYI Only or Action Required?: FYI only for provider: PT HH would like to know what range PCP would like for pts blood sugar, per family of pt PCP prefers her blood sugar over 200.  Pts blood sugar over the last week has been 67-357, asymptomatic.  Patient was last seen in primary care on 01/26/2024 by Gladis Mustard, FNP.  Called Nurse Triage reporting Blood Sugar Problem.  Symptoms began a week ago.  Interventions attempted: Rest, hydration, or home remedies.  Symptoms are: unchanged.  Triage Disposition: Call PCP Within 24 Hours  Patient/caregiver understands and will follow disposition?: Yes, will follow disposition  Copied from CRM #[id number]. Topic: Clinical - Red Word Triage >> Feb 08, 2024  1:54 PM Antwanette L wrote: Red Word that prompted transfer to Nurse Triage: Olivia from [place] [facility] is calling to report the blood sugar ranges from 67-357 Reason for Disposition  [1] Caller has NON-URGENT medication or insulin  device (e.g., pump, continuous monitoring) question AND [2] triager unable to answer question  Answer Assessment - Initial Assessment Questions 1. BLOOD GLUCOSE: What is your blood glucose level?      Pt HH PT calling to let PCP know that pt is having blood sugars as high as 357, per the pts family they are stating that PCP prefers that the pt BGM is above 200. PT would like clarification . PT is not currently with the pt as she was at the pts house yesterday. Per PT pt was asymptomatic with a blood sugar of 294 yesterday during visit.  Please advise HH of target BGM range.  Protocols used: Diabetes - High Blood Sugar-A-AH

## 2024-02-08 NOTE — Transitions of Care (Post Inpatient/ED Visit) (Addendum)
 02/08/2024  Name: Misty Edwards MRN: [id number] DOB: 15-Jun-1926  Today's TOC FU Call Status: Today's TOC FU Call Status:: Successful TOC FU Call Completed TOC FU Call Complete Date: 02/08/24  Patient's Name and Date of Birth confirmed. Name, DOB  Transition Care Management Follow-up Telephone Call Date of Discharge: 02/05/24 [facility] Type of Discharge: Inpatient Admission Primary Inpatient Discharge Diagnosis:: Urinary tract infection How have you been since you were released from the hospital?:  (appetite good, no issues with bowel/ bladder, ambulating with walker) Any questions or concerns?: No  Items Reviewed: Did you receive and understand the discharge instructions provided?: Yes Medications obtained,verified, and reconciled?: Yes (Medications Reviewed) Any new allergies since your discharge?: No Dietary orders reviewed?: Yes Type of Diet Ordered:: heart healthy Do you have support at home?: Yes People in Home [RPT]: grandchild(ren), child(ren), dependent Name of Support/Comfort Primary Source: adult grandson Ozell and his spouse Reviewed importance of starting back daily weights when pt feel stronger Reviewed sign/ symptoms UTI, infection, prevention of UTI discussed Reviewed signs/ symptoms hypoglycemia, action plan, treatment Reported to primary care provider CBG of 57 at 5 am, in basket message sent Darden Ozell declined enrollment and declined for RN CM to make follow up appointment (discharge summary states to be seen within 3 days and have labwork done)  RN CM made grandson Ozell aware and states pt too weak at present to go anywhere in the car RN CM suggested home health complete the needed labs and grandson says pt has extremely poor venous access and this is not an option.  Medications Reviewed Today: Medications Reviewed Today     Reviewed by Aura Mliss LABOR, RN (Registered Nurse) on 02/08/24 at 1012  Med List Status: <None>    Medication Order Taking? Sig Documenting Provider Last Dose Status Informant  acetaminophen  (TYLENOL ) 325 MG tablet [id number] Yes Take 650 mg by mouth every 6 (six) hours as needed for mild pain (pain score 1-3). [provider]  Active Family Member, Child, Pharmacy Records, Self  albuterol  (VENTOLIN  HFA) [phone] Base) MCG/ACT inhaler [id number]  Inhale 1-2 puffs into the lungs every 6 (six) hours as needed for wheezing or shortness of breath.  Patient not taking: Reported on 02/08/2024   Aletha Bene, MD  Active Family Member, Child, Pharmacy Records, Self  alum & mag hydroxide-simeth (MAALOX/MYLANTA) 200-200-20 MG/5ML suspension [id number] Yes Take 30 mLs by mouth every 6 (six) hours as needed for indigestion or heartburn. [provider]  Active Family Member, Child, Pharmacy Records, Self  apixaban  (ELIQUIS ) 2.5 MG TABS tablet [id number] Yes TAKE 1 TABLET BY MOUTH TWICE DAILY Branch, Dorn FALCON, MD  Active Family Member, Child, Pharmacy Records, Self  Blood Glucose Monitoring Suppl (BLOOD GLUCOSE SYSTEM PAK) KIT [id number] Yes Please dispense as One Touch Ultra. Use as directed to monitor FSBS 4x daily. Dx: E11.65 Duanne Butler DASEN, MD  Active Family Member, Child, Pharmacy Records, Self  calcium  carbonate (TUMS - DOSED IN MG ELEMENTAL CALCIUM ) 500 MG chewable tablet [id number] Yes Chew 3 tablets by mouth as needed for heartburn or indigestion. [provider]  Active Family Member, Child, Pharmacy Records, Self  cetirizine  (ZYRTEC ) 10 MG tablet [id number] Yes TAKE 1 TABLET BY MOUTH DAILY Pickard, Butler DASEN, MD  Active Family Member, Child, Pharmacy Records, Self  Continuous Blood Gluc Receiver (FREESTYLE LIBRE 3 READER) DEVI [id number] Yes 1 Device by Does not apply route daily. Use to continuously check blood sugar daily. Duanne Butler DASEN, MD  Active Family Member, Child, Pharmacy Records, Self  Continuous Glucose Sensor (FREESTYLE LIBRE 3 SENSOR) [place] [id number] Yes USE  TO CHECK BLOOD GLUCOSE LEVELS CONTINUOUSLY AND CHANGE EVERY 14 DAYS Duanne Butler DASEN, MD  Active Family Member, Child, Pharmacy Records, Self  CREON  24000-76000 units CPEP [id number] Yes Take 1 capsule (24,000 Units total) by mouth See admin instructions. TAKE 2 CAPSULES BY MOUTH WITH MEALS AND 1 CAP WITH SNACKS x 2 Duanne Butler DASEN, MD  Active Family Member, Child, Pharmacy Records, Self  estradiol  (ESTRACE  VAGINAL) 0.1 MG/GM vaginal cream [id number] Yes Place 1 Applicatorful vaginally at bedtime. Duanne Butler DASEN, MD  Active Family Member, Child, Pharmacy Records, Self           Med Note (WARD, CHUCK KANDICE Repress Jan 31, 2024  7:17 PM) Pt stated she ran out of this medication shortly before experiencing the symptoms she is experiencing now  famotidine  (PEPCID ) 40 MG tablet [id number] Yes Take 1 tablet (40 mg total) by mouth daily. Duanne Butler DASEN, MD  Active Family Member, Child, Pharmacy Records, Self           Med Note [phone], CHUCK KANDICE Repress Jan 31, 2024  7:13 PM) Pt needs a refill sent to [facility]  ferrous sulfate  325 (65 FE) MG tablet [id number] Yes Take 325 mg by mouth daily with breakfast. [provider]  Active Family Member, Child, Pharmacy Records, Self           Med Note SHERALYN PARTICIA KATHEE Pablo May 11, 2017  2:34 PM)    FLUoxetine  (PROZAC ) 20 MG capsule [id number] Yes TAKE 1 CAPSULE BY MOUTH ONCE DAILY Duanne Butler DASEN, MD  Active Family Member, Child, Pharmacy Records, Self  furosemide  (LASIX ) 20 MG tablet [id number] Yes Take 1 tablet (20 mg total) by mouth See admin instructions. Take 40 mg in the morning and 20 mg at 4 pm. Duanne Butler DASEN, MD  Active Family Member, Child, Pharmacy Records, Self  insulin  aspart protamine - aspart (NOVOLOG  MIX 70/30 RELION) (70-30) 100 UNIT/ML injection [id number] Yes 50 units in am, 8 units in pm  Patient taking differently: Inject 50 Units into the skin 2 (two) times daily with a meal. 50 units in am, 15 units at night per family report    Duanne Butler DASEN, MD  Active Family Member, Child, Pharmacy Records, Self  Insulin  Syringe-Needle U-100 (BD INSULIN  SYRINGE U/F) 31G X 5/16 0.5 ML MISC [id number] Yes USE AS DIRECTED TO ADMINISTER INSULIN  TWICE DAILY Duanne Butler DASEN, MD  Active Family Member, Child, Pharmacy Records, Self  Insulin  Syringes, Disposable, U-100 1 ML MISC [id number] Yes Use as directed to administer insulin  daily. Duanne Butler DASEN, MD  Active Family Member, Child, Pharmacy Records, Self  Lancets Misc. MISC [id number] Yes Pt has Accu Chek Aviva Plus - Needs just lancets  Checks BS 4-5 times per day. Disp 3 boxes(90 day Supply)/4 refills Dx code. E11.9 Duanne Butler DASEN, MD  Active Family Member, Child, Pharmacy Records, Self  LORazepam  (ATIVAN ) 1 MG tablet [id number] Yes TAKE 1/2 TABLET BY MOUTH AT BEDTIME Duanne Butler DASEN, MD  Active Family Member, Child, Pharmacy Records, Self  magnesium  oxide (MAG-OX) 400 MG tablet [id number] Yes Take 1 tablet (400 mg total) by mouth daily. Duanne Butler DASEN, MD  Active Family Member, Child, Pharmacy Records, Self  metoprolol  succinate (TOPROL -XL) 25 MG 24 hr tablet [id number] Yes Take 3 tablets (75 mg total) by mouth 2 (two) times daily. Caleen Qualia, MD  Active   Multiple Vitamin (MULTIVITAMIN) capsule [id number] Yes Take 1 capsule by mouth every morning.  [provider]  Active Family Member, Child, Pharmacy Records, Self  nystatin  cream (MYCOSTATIN ) [id number] Yes Apply 1 Application topically 3 (three) times daily. Duanne Butler DASEN, MD  Active Family Member, Child, Pharmacy Records, Self  ondansetron  (ZOFRAN ) 4 MG tablet [id number]  Take 1 tablet (4 mg total) by mouth every 8 (eight) hours as needed for nausea or vomiting.  Patient not taking: Reported on 02/08/2024   Duanne Butler DASEN, MD  Active Family Member, Child, Pharmacy Records, Self  OneTouch [place] Lancets 33G [place] [id number] Yes CHECK BLOOD SUGAR 4-5 TIMES PER DAY Duanne Butler DASEN, MD  Active Family Member, Child,  Pharmacy Records, Self  [facility] ULTRA test strip [id number] Yes USE 1 STRIP TO CHECK GLUCOSE FIVE TIMES DAILY Duanne Butler DASEN, MD  Active Family Member, Child, Pharmacy Records, Self  pantoprazole  (PROTONIX ) 40 MG tablet [id number] Yes Take 1 tablet (40 mg total) by mouth daily. Duanne Butler DASEN, MD  Active Family Member, Child, Pharmacy Records, Self  potassium chloride  (KLOR-CON ) 10 MEQ tablet [id number] Yes TAKE 1 TABLET BY MOUTH DAILY Strader, Laymon HERO, PA-C  Active Family Member, Child, Pharmacy Records, Self  Propylene Glycol-Glycerin (SOOTHE OP) [id number] Yes Apply 2 drops to eye 2 (two) times daily. [provider]  Active Family Member, Child, Pharmacy Records, Self  rosuvastatin  (CRESTOR ) 5 MG tablet [id number] Yes TAKE 1 TABLET BY MOUTH EACH EVENING Duanne Butler DASEN, MD  Active Family Member, Child, Pharmacy Records, Self  triamcinolone  cream (KENALOG ) 0.1 % [id number] Yes Apply topically 2 (two) times daily. Duanne Butler DASEN, MD  Active Family Member, Child, Pharmacy Records, Self            Home Care and Equipment/Supplies: Were Home Health Services Ordered?: Yes Name of Home Health Agency:: [facility] Has Agency set up a time to come to your home?: Yes First Home Health Visit Date: 02/07/24 Any new equipment or medical supplies ordered?: No  Functional Questionnaire: Do you need assistance with bathing/showering or dressing?: Yes (shower chair and family assist) Do you need assistance with meal preparation?: Yes (family assists) Do you need assistance with eating?: No Do you have difficulty maintaining continence: Yes (adult diapers) Do you need assistance with getting out of bed/getting out of a chair/moving?: Yes (walker, family assist) Do you have difficulty managing or taking your medications?: Yes (family provides all oversight)  Follow up appointments reviewed: PCP Follow-up appointment confirmed?: No (patient's caregiver decline for RN CM to schedule  appointment, he prefers to call, states pt is too weak to go out to doctor at present) MD Provider Line Number:[phone] Given: No Specialist Hospital Follow-up appointment confirmed?: Yes Date of Specialist follow-up appointment?: 04/12/24 Follow-Up Specialty Provider:: Dr. Alvan cardiologist Do you need transportation to your follow-up appointment?: No Do you understand care options if your condition(s) worsen?: Yes-patient verbalized understanding  SDOH Interventions Today    Flowsheet Row Most Recent Value  SDOH Interventions   Food Insecurity Interventions Intervention Not Indicated  Housing Interventions Intervention Not Indicated  Transportation Interventions Intervention Not Indicated  Utilities Interventions Intervention Not Indicated    Mliss Creed [facility], BSN RN Care Manager/ Transition of Care [place]/ [facility] Population [facility] [phone]

## 2024-02-08 NOTE — Telephone Encounter (Signed)
 Copied from CRM #[id number]. Topic: Clinical - Home Health Verbal Orders >> Feb 08, 2024  1:49 PM Antwanette L wrote: Caller/Agency: [facility]/ [facility] Callback Number: [phone] Service Requested: Physical Therapy Frequency: 2w4 and 1w4 Any new concerns about the patient?Yes. Family reports patient is not at the same cognitive level since discharge. Respiratory rate: 28 (patient not in distress).- Blood sugar: 294. Blood pressure: Family states provider wants BP maintained at 200

## 2024-02-09 ENCOUNTER — Other Ambulatory Visit: Payer: Self-pay | Admitting: Family Medicine

## 2024-02-10 ENCOUNTER — Other Ambulatory Visit: Payer: Self-pay

## 2024-02-10 DIAGNOSIS — E1169 Type 2 diabetes mellitus with other specified complication: Secondary | ICD-10-CM

## 2024-02-10 DIAGNOSIS — K861 Other chronic pancreatitis: Secondary | ICD-10-CM

## 2024-02-10 DIAGNOSIS — R12 Heartburn: Secondary | ICD-10-CM

## 2024-02-10 DIAGNOSIS — I10 Essential (primary) hypertension: Secondary | ICD-10-CM

## 2024-02-10 DIAGNOSIS — K863 Pseudocyst of pancreas: Secondary | ICD-10-CM

## 2024-02-10 DIAGNOSIS — N289 Disorder of kidney and ureter, unspecified: Secondary | ICD-10-CM

## 2024-02-10 DIAGNOSIS — I5032 Chronic diastolic (congestive) heart failure: Secondary | ICD-10-CM

## 2024-02-10 MED ORDER — CREON 24000-76000 UNITS PO CPEP: ORAL_CAPSULE | ORAL | 11 refills | Status: AC

## 2024-02-10 MED ORDER — FAMOTIDINE 40 MG PO TABS: 40.0000 mg | ORAL_TABLET | Freq: Every day | ORAL | 1 refills | Status: AC

## 2024-02-10 MED ORDER — INSULIN REGULAR HUMAN 100 UNIT/ML IJ SOLN
INTRAMUSCULAR | 11 refills | Status: DC
Start: 2024-02-10 — End: 2024-02-10

## 2024-02-10 MED ORDER — MAGNESIUM OXIDE 400 MG PO TABS: 400.0000 mg | ORAL_TABLET | Freq: Every day | ORAL | 1 refills | Status: AC

## 2024-02-10 MED ORDER — INSULIN REGULAR HUMAN 100 UNIT/ML IJ SOLN: INTRAMUSCULAR | 11 refills | Status: AC

## 2024-02-15 ENCOUNTER — Ambulatory Visit: Admitting: Family Medicine

## 2024-02-15 ENCOUNTER — Encounter: Payer: Self-pay | Admitting: Family Medicine

## 2024-02-15 VITALS — BP 142/88 | HR 93 | Temp 97.6°F | Ht 63.0 in | Wt 155.6 lb

## 2024-02-15 DIAGNOSIS — R410 Disorientation, unspecified: Secondary | ICD-10-CM

## 2024-02-15 DIAGNOSIS — N390 Urinary tract infection, site not specified: Secondary | ICD-10-CM

## 2024-02-15 LAB — CBC WITH DIFFERENTIAL/PLATELET
Absolute Lymphocytes: 2020 {cells}/uL (ref 850–3900)
Absolute Monocytes: 530 {cells}/uL (ref 200–950)
Basophils Absolute: 39 {cells}/uL (ref 0–200)
Basophils Relative: 0.5 %
Eosinophils Absolute: 156 {cells}/uL (ref 15–500)
Eosinophils Relative: 2 %
HCT: 40.1 % (ref 35.9–46.0)
Hemoglobin: 12.9 g/dL (ref 11.7–15.5)
MCH: 28.7 pg (ref 27.0–33.0)
MCHC: 32.2 g/dL (ref 31.6–35.4)
MCV: 89.1 fL (ref 81.4–101.7)
MPV: 12.5 fL (ref 7.5–12.5)
Monocytes Relative: 6.8 %
Neutro Abs: 5054 {cells}/uL (ref 1500–7800)
Neutrophils Relative %: 64.8 %
Platelets: 139 Thousand/uL — ABNORMAL LOW (ref 140–400)
RBC: 4.5 Million/uL (ref 3.80–5.10)
RDW: 13.1 % (ref 11.0–15.0)
Total Lymphocyte: 25.9 %
WBC: 7.8 Thousand/uL (ref 3.8–10.8)

## 2024-02-15 LAB — COMPREHENSIVE METABOLIC PANEL WITH GFR
AG Ratio: 1.8 (calc) (ref 1.0–2.5)
ALT: 111 U/L — ABNORMAL HIGH (ref 6–29)
AST: 119 U/L — ABNORMAL HIGH (ref 10–35)
Albumin: 4.2 g/dL (ref 3.6–5.1)
Alkaline phosphatase (APISO): 75 U/L (ref 37–153)
BUN/Creatinine Ratio: 22 (calc) (ref 6–22)
BUN: 39 mg/dL — ABNORMAL HIGH (ref 7–25)
CO2: 31 mmol/L (ref 20–32)
Calcium: 9.1 mg/dL (ref 8.6–10.4)
Chloride: 94 mmol/L — ABNORMAL LOW (ref 98–110)
Creat: 1.74 mg/dL — ABNORMAL HIGH (ref 0.60–0.95)
Globulin: 2.4 g/dL (ref 1.9–3.7)
Glucose, Bld: 164 mg/dL — ABNORMAL HIGH (ref 65–99)
Potassium: 5 mmol/L (ref 3.5–5.3)
Sodium: 132 mmol/L — ABNORMAL LOW (ref 135–146)
Total Bilirubin: 1.1 mg/dL (ref 0.2–1.2)
Total Protein: 6.6 g/dL (ref 6.1–8.1)
eGFR: 27 mL/min/1.73m2 — ABNORMAL LOW (ref >=60)

## 2024-02-15 LAB — URINALYSIS, ROUTINE W REFLEX MICROSCOPIC
Bilirubin Urine: NEGATIVE
Glucose, UA: NEGATIVE
Hgb urine dipstick: NEGATIVE
Ketones, ur: NEGATIVE
Leukocytes,Ua: NEGATIVE
Nitrite: NEGATIVE
Protein, ur: NEGATIVE
Specific Gravity, Urine: 1.01 (ref 1.001–1.035)
pH: 7 (ref 5.0–8.0)

## 2024-02-15 NOTE — Progress Notes (Signed)
 Subjective:    Patient ID: Misty Edwards, female    DOB: 09-Sep-1926, 89 y.o.   MRN: [id number]   89 year old F with PMH of IDDM-2, A-fib on Eliquis , diastolic CHF, CKD-3B, CAD, HTN and HLD who presents for follow up of her recent hospitalization for uti:  Admit date:     01/31/2024  Discharge date: 02/05/24  Discharge Physician: Amaryllis Dare    PCP: Duanne Butler DASEN, MD    Recommendations at discharge:  Please obtain CBC and BMP on follow-up Follow-up with cardiology Follow-up with primary care provider   Discharge Diagnoses: Principal Problem:   Urinary tract infection Active Problems:   Acute metabolic encephalopathy   Atrial fibrillation with rapid ventricular response [facility])     Hospital Course: 89 y.o. female with medical history significant of coronary artery disease status post CABG, type 2 diabetes mellitus, hyperlipidemia, hypertension, IBS, anxiety who was otherwise well until the last 1 week when she started having generalized weakness, some intermittent confusion as well as dysuria.    Patient was found to have pansensitive E. coli UTI and completed the course of antibiotic while in the hospital.   Our physical therapist evaluated her recommending rehab at SNF but family declined and would like her to go back home as they have 24/7 care available.   Patient was also found to have RVR with history of atrial fibrillation, cardiology was consulted.  Echocardiogram with normal EF, no regional wall motion abnormalities and indeterminate diastolic parameters.  Her home metoprolol  dose was increased to 75 mg twice daily for better control of heart rate.  She will continue home Eliquis .  TSH was normal. Patient with history of CKD stage IIIb with stable creatinine.   Patient will continue with the rest of her home medications and follow-up with her providers for further assistance.  02/15/24 Since completing the antibiotic, the patient's son states that she is still very  confused and disoriented.  Physically she is getting stronger.  However she is not back to her baseline mental status.  They deny any fever.  Repeat urinalysis today is negative for blood, negative for nitrates, negative for leukocyte esterase.  She is currently on metoprolol  75 mg twice daily.  Her heart rate is controlled today.  She denies any shortness of breath or chest pain.  I examined the patient's perineum.  She complains of pain inside the right labia.  She does have a thrombosed small vein inside the right labia which is tender to touch.  However there is no ulcer or evidence of an abscess or an infection.  There is no secondary cellulitis or Fournier's gangrene or yeast infection or rash.   Past Medical History:  Diagnosis Date   Acute biliary pancreatitis 07/2002   thia was in 07/2004:she still has pseudocyst in tail of pancreas measuring 54 x 33 mm    Adrenal adenoma    bilateral   Anxiety    Atrial fibrillation (HCC)    CAD (coronary artery disease)    a. s/p CABG in 08/2014 with LIMA-LAD, SVG-OM and SVG-PDA   CAD (coronary artery disease)    a. s/p CABG in 08/2014 with LIMA-LAD, SVG-OM and SVG-PDA   Chronic pancreatitis (HCC)    Detached retina    Diabetes mellitus (HCC)    Diverticulosis    Heartburn    History of carcinoma in situ of breast 1988   Hyperlipidemia    Hypertension    IBS (irritable bowel syndrome)    Legally blind  Osteoarthritis    Osteopenia    Upper GI bleed September2004   secondary to gastritis   Past Surgical History:  Procedure Laterality Date   CARDIAC CATHETERIZATION N/A 08/24/2014   Procedure: Left Heart Cath and Coronary Angiography;  Surgeon: Alm LELON Clay, MD;  Location: [facility] INVASIVE CV LAB;  Service: Cardiovascular;  Laterality: N/A;   COLONOSCOPY  08/15/05   few tiny diverticula at sigmoid colon/external hemorrhoids but no polyps   COLONOSCOPY  01/08/2012   MFM:Rnonwpr diverticulosis. Next colonoscopy in 12/2016   CORONARY ARTERY  BYPASS GRAFT N/A 08/24/2014   Procedure: CORONARY ARTERY BYPASS GRAFTING (CABG) x three, using left internal mammary artery and right leg greater saphenous vein harvested endoscopically;  Surgeon: Maude Fleeta Ochoa, MD;  Location: [facility] OR;  Service: Open Heart Surgery;  Laterality: N/A;   ECTOPIC PREGNANCY SURGERY  1950's   ERCP with sphincterotomy  07/2002   ESOPHAGOGASTRODUODENOSCOPY      Gastritis of body.  Otherwise normal   ESOPHAGOGASTRODUODENOSCOPY  01/08/2012   RMR: Few scattered gastric erosions of uncertain significance-status post biopsy. Minimal chronic inflammation, no H.pylori   LAPAROSCOPIC CHOLECYSTECTOMY     MASTECTOMY Right 1980   PANCREATIC PSEUDOCYST DRAINAGE  [id number]   drained percutaneously    right mastectomy     tacking up of her bladder     TEE WITHOUT CARDIOVERSION N/A 08/24/2014   Procedure: TRANSESOPHAGEAL ECHOCARDIOGRAM (TEE);  Surgeon: Maude Fleeta Ochoa, MD;  Location: [facility] OR;  Service: Open Heart Surgery;  Laterality: N/A;   Current Outpatient Medications on File Prior to Visit  Medication Sig Dispense Refill   acetaminophen  (TYLENOL ) 325 MG tablet Take 650 mg by mouth every 6 (six) hours as needed for mild pain (pain score 1-3).     albuterol  (VENTOLIN  HFA) 108 (90 Base) MCG/ACT inhaler Inhale 1-2 puffs into the lungs every 6 (six) hours as needed for wheezing or shortness of breath. (Patient not taking: Reported on 02/08/2024) 1 each 0   alum & mag hydroxide-simeth (MAALOX/MYLANTA) 200-200-20 MG/5ML suspension Take 30 mLs by mouth every 6 (six) hours as needed for indigestion or heartburn.     apixaban  (ELIQUIS ) 2.5 MG TABS tablet TAKE 1 TABLET BY MOUTH TWICE DAILY 180 tablet 1   Blood Glucose Monitoring Suppl (BLOOD GLUCOSE SYSTEM PAK) KIT Please dispense as One Touch Ultra. Use as directed to monitor FSBS 4x daily. Dx: E11.65 1 kit 1   calcium  carbonate (TUMS - DOSED IN MG ELEMENTAL CALCIUM ) 500 MG chewable tablet Chew 3 tablets by mouth as needed for heartburn or  indigestion.     cetirizine  (ZYRTEC ) 10 MG tablet TAKE 1 TABLET BY MOUTH DAILY 90 tablet 11   Continuous Blood Gluc Receiver (FREESTYLE LIBRE 3 READER) DEVI 1 Device by Does not apply route daily. Use to continuously check blood sugar daily. 1 each 1   Continuous Glucose Sensor (FREESTYLE LIBRE 3 SENSOR) MISC USE TO CHECK BLOOD GLUCOSE LEVELS CONTINUOUSLY AND CHANGE EVERY 14 DAYS 6 each 0   CREON  24000-76000 units CPEP TAKE 2 CAPSULES BY MOUTH WITH MEALS AND 1 CAP WITH SNACKS x 2 240 capsule 11   estradiol  (ESTRACE  VAGINAL) 0.1 MG/GM vaginal cream Place 1 Applicatorful vaginally at bedtime. 42.5 g 12   famotidine  (PEPCID ) 40 MG tablet Take 1 tablet (40 mg total) by mouth daily. 90 tablet 1   ferrous sulfate  325 (65 FE) MG tablet Take 325 mg by mouth daily with breakfast.     FLUoxetine  (PROZAC ) 20 MG capsule TAKE 1 CAPSULE BY  MOUTH ONCE DAILY 90 capsule 10   furosemide  (LASIX ) 20 MG tablet Take 1 tablet (20 mg total) by mouth See admin instructions. Take 40 mg in the morning and 20 mg at 4 pm.     insulin  aspart protamine - aspart (NOVOLOG  MIX 70/30 RELION) (70-30) 100 UNIT/ML injection 50 units in am, 8 units in pm (Patient taking differently: Inject 50 Units into the skin 2 (two) times daily with a meal. 50 units in am, 15 units at night per family report)     insulin  regular (HUMULIN  R) 100 units/mL injection Check 3 times a day before meals and administer insulin  based on her sugars Regular insulin   sugar 0 units  < 150 2 units 151-200 4 units 201-250 6 units 251-300 8 units 301-350 10 units 351+ 10 mL 11   Insulin  Syringe-Needle U-100 (BD INSULIN  SYRINGE U/F) 31G X 5/16 0.5 ML MISC USE AS DIRECTED TO ADMINISTER INSULIN  TWICE DAILY 300 each 1   Insulin  Syringes, Disposable, U-100 1 ML MISC Use as directed to administer insulin  daily. 100 each 3   Lancets Misc. MISC Pt has Accu Chek Aviva Plus - Needs just lancets  Checks BS 4-5 times per day. Disp 3 boxes(90 day Supply)/4 refills Dx code.  E11.9 450 each 3   LORazepam  (ATIVAN ) 1 MG tablet TAKE 1/2 TABLET BY MOUTH AT BEDTIME 30 tablet 4   magnesium  oxide (MAG-OX) 400 MG tablet Take 1 tablet (400 mg total) by mouth daily. 90 tablet 1   metoprolol  succinate (TOPROL -XL) 25 MG 24 hr tablet Take 3 tablets (75 mg total) by mouth 2 (two) times daily. 180 tablet 1   Multiple Vitamin (MULTIVITAMIN) capsule Take 1 capsule by mouth every morning.      nystatin  cream (MYCOSTATIN ) Apply 1 Application topically 3 (three) times daily. 30 g 0   ondansetron  (ZOFRAN ) 4 MG tablet Take 1 tablet (4 mg total) by mouth every 8 (eight) hours as needed for nausea or vomiting. (Patient not taking: Reported on 02/08/2024) 20 tablet 0   OneTouch Delica Lancets 33G MISC CHECK BLOOD SUGAR 4-5 TIMES PER DAY 300 each 0   ONETOUCH ULTRA test strip USE 1 STRIP TO CHECK GLUCOSE FIVE TIMES DAILY 300 each 10   pantoprazole  (PROTONIX ) 40 MG tablet Take 1 tablet (40 mg total) by mouth daily. 90 tablet 3   potassium chloride  (KLOR-CON ) 10 MEQ tablet TAKE 1 TABLET BY MOUTH DAILY 90 tablet 0   Propylene Glycol-Glycerin (SOOTHE OP) Apply 2 drops to eye 2 (two) times daily.     rosuvastatin  (CRESTOR ) 5 MG tablet TAKE 1 TABLET BY MOUTH EACH EVENING 90 tablet 11   triamcinolone  cream (KENALOG ) 0.1 % Apply topically 2 (two) times daily. 60 g 10   No current facility-administered medications on file prior to visit.   Allergies  Allergen Reactions   Morphine  And Codeine Other (See Comments)    Hallucination   Calcium -Containing Compounds Nausea Only   Raloxifene  Itching    Evista - Face and eyes burning   Vitamin D Analogs Nausea Only   Social History   Socioeconomic History   Marital status: Widowed    Spouse name: Not on file   Number of children: 5   Years of education: Not on file   Highest education level: Not on file  Occupational History   Occupation: homemaker  Tobacco Use   Smoking status: Never   Smokeless tobacco: Never  Vaping Use   Vaping status:  Never Used  Substance and Sexual Activity  Alcohol use: No    Alcohol/week: 0.0 standard drinks of alcohol   Drug use: No   Sexual activity: Not Currently    Birth control/protection: Post-menopausal  Other Topics Concern   Not on file  Social History Narrative   Lives in [place].   Social Drivers of Corporate Investment Banker Strain: Not on file  Food Insecurity: No Food Insecurity (02/08/2024)   Hunger Vital Sign    Worried About Running Out of Food in the Last Year: Never true    Ran Out of Food in the Last Year: Never true  Transportation Needs: No Transportation Needs (02/08/2024)   PRAPARE - Administrator, Civil Service (Medical): No    Lack of Transportation (Non-Medical): No  Physical Activity: Not on file  Stress: Not on file  Social Connections: Moderately Isolated (01/31/2024)   Social Connection and Isolation Panel    Frequency of Communication with Friends and Family: More than three times a week    Frequency of Social Gatherings with Friends and Family: More than three times a week    Attends Religious Services: Patient declined    Database Administrator or Organizations: Yes    Attends Banker Meetings: 1 to 4 times per year    Marital Status: Widowed  Intimate Partner Violence: Not At Risk (02/08/2024)   Humiliation, Afraid, Rape, and Kick questionnaire    Fear of Current or Ex-Partner: No    Emotionally Abused: No    Physically Abused: No    Sexually Abused: No          Review of Systems  All other systems reviewed and are negative.      Objective:       Physical Exam Constitutional:      General: She is not in acute distress.    Appearance: Normal appearance. She is normal weight. She is not ill-appearing or toxic-appearing.  Cardiovascular:     Rate and Rhythm: Normal rate. Rhythm irregular.     Heart sounds: Normal heart sounds. No murmur heard.    No friction rub. No gallop.  Pulmonary:     Effort:  Pulmonary effort is normal. No respiratory distress.     Breath sounds: No stridor. No wheezing, rhonchi or rales.  Chest:     Chest wall: No tenderness.  Abdominal:     General: Abdomen is flat. Bowel sounds are normal. There is no distension.     Palpations: Abdomen is soft. There is no mass.     Tenderness: There is no abdominal tenderness.  Musculoskeletal:     Right lower leg: No edema.     Left lower leg: No edema.  Skin:    Findings: No erythema or rash.  Neurological:     General: No focal deficit present.     Mental Status: She is alert and oriented to person, place, and time. Mental status is at baseline.     Cranial Nerves: No cranial nerve deficit.     Sensory: No sensory deficit.     Motor: Weakness present.     Coordination: Coordination normal.   See the description and the history of present illness.  Small thrombosed vein on the inner right labia Assessment & Plan:  Confusion - Plan: Urinalysis, Routine w reflex microscopic, Urine Culture, CBC with Differential/Platelet, Comprehensive metabolic panel with GFR Exam today is unremarkable.  Vital signs are stable.  Blood sugars over the last 48 hours are averaging between 100 and 220.  I believe the patient is likely still slightly delirious from her recent hospitalization due to her advanced age.  I recommended holding lorazepam .  Await for urine culture but urinalysis is unremarkable.  Check CBC to rule out leukocytosis or any evidence of an occult infection.  Anticipate mental status will gradually improve with time and duration home from hospital

## 2024-02-16 ENCOUNTER — Other Ambulatory Visit: Payer: Self-pay | Admitting: Family Medicine

## 2024-02-16 ENCOUNTER — Other Ambulatory Visit: Payer: Self-pay

## 2024-02-16 ENCOUNTER — Ambulatory Visit: Payer: Self-pay | Admitting: Family Medicine

## 2024-02-16 DIAGNOSIS — R410 Disorientation, unspecified: Secondary | ICD-10-CM

## 2024-02-16 DIAGNOSIS — R7989 Other specified abnormal findings of blood chemistry: Secondary | ICD-10-CM

## 2024-02-16 DIAGNOSIS — R1032 Left lower quadrant pain: Secondary | ICD-10-CM

## 2024-02-16 LAB — URINE CULTURE
MICRO NUMBER:: 17295633
SPECIMEN QUALITY:: ADEQUATE

## 2024-02-17 ENCOUNTER — Encounter (HOSPITAL_COMMUNITY): Payer: Self-pay

## 2024-02-17 ENCOUNTER — Other Ambulatory Visit: Payer: Self-pay

## 2024-02-17 ENCOUNTER — Emergency Department (HOSPITAL_COMMUNITY)
Admission: EM | Admit: 2024-02-17 | Discharge: 2024-02-19 | Disposition: A | Attending: Emergency Medicine | Admitting: Emergency Medicine

## 2024-02-17 DIAGNOSIS — R300 Dysuria: Secondary | ICD-10-CM | POA: Insufficient documentation

## 2024-02-17 DIAGNOSIS — Z7901 Long term (current) use of anticoagulants: Secondary | ICD-10-CM | POA: Insufficient documentation

## 2024-02-17 DIAGNOSIS — Z79899 Other long term (current) drug therapy: Secondary | ICD-10-CM | POA: Insufficient documentation

## 2024-02-17 DIAGNOSIS — I2510 Atherosclerotic heart disease of native coronary artery without angina pectoris: Secondary | ICD-10-CM | POA: Insufficient documentation

## 2024-02-17 DIAGNOSIS — Z794 Long term (current) use of insulin: Secondary | ICD-10-CM | POA: Insufficient documentation

## 2024-02-17 DIAGNOSIS — I4891 Unspecified atrial fibrillation: Secondary | ICD-10-CM | POA: Insufficient documentation

## 2024-02-17 DIAGNOSIS — R350 Frequency of micturition: Secondary | ICD-10-CM | POA: Insufficient documentation

## 2024-02-17 DIAGNOSIS — E119 Type 2 diabetes mellitus without complications: Secondary | ICD-10-CM | POA: Insufficient documentation

## 2024-02-17 DIAGNOSIS — R410 Disorientation, unspecified: Secondary | ICD-10-CM | POA: Insufficient documentation

## 2024-02-17 DIAGNOSIS — I110 Hypertensive heart disease with heart failure: Secondary | ICD-10-CM | POA: Insufficient documentation

## 2024-02-17 DIAGNOSIS — I5032 Chronic diastolic (congestive) heart failure: Secondary | ICD-10-CM | POA: Insufficient documentation

## 2024-02-17 DIAGNOSIS — R7401 Elevation of levels of liver transaminase levels: Secondary | ICD-10-CM | POA: Insufficient documentation

## 2024-02-17 DIAGNOSIS — Z955 Presence of coronary angioplasty implant and graft: Secondary | ICD-10-CM | POA: Insufficient documentation

## 2024-02-17 LAB — CBC WITH DIFFERENTIAL/PLATELET
Abs Immature Granulocytes: 0.02 K/uL (ref 0.00–0.07)
Basophils Absolute: 0 K/uL (ref 0.0–0.1)
Basophils Relative: 0 %
Eosinophils Absolute: 0.1 K/uL (ref 0.0–0.5)
Eosinophils Relative: 2 %
HCT: 39.4 % (ref 36.0–46.0)
Hemoglobin: 12.8 g/dL (ref 12.0–15.0)
Immature Granulocytes: 0 %
Lymphocytes Relative: 21 %
Lymphs Abs: 1.6 K/uL (ref 0.7–4.0)
MCH: 29.4 pg (ref 26.0–34.0)
MCHC: 32.5 g/dL (ref 30.0–36.0)
MCV: 90.4 fL (ref 80.0–100.0)
Monocytes Absolute: 0.7 K/uL (ref 0.1–1.0)
Monocytes Relative: 9 %
Neutro Abs: 5.1 K/uL (ref 1.7–7.7)
Neutrophils Relative %: 68 %
Platelets: 130 K/uL — ABNORMAL LOW (ref 150–400)
RBC: 4.36 MIL/uL (ref 3.87–5.11)
RDW: 13.7 % (ref 11.5–15.5)
WBC: 7.5 K/uL (ref 4.0–10.5)
nRBC: 0 % (ref 0.0–0.2)

## 2024-02-17 LAB — COMPREHENSIVE METABOLIC PANEL WITH GFR
ALT: 109 U/L — ABNORMAL HIGH (ref 0–44)
AST: 86 U/L — ABNORMAL HIGH (ref 15–41)
Albumin: 4.2 g/dL (ref 3.5–5.0)
Alkaline Phosphatase: 86 U/L (ref 38–126)
Anion gap: 8 (ref 5–15)
BUN: 27 mg/dL — ABNORMAL HIGH (ref 8–23)
CO2: 32 mmol/L (ref 22–32)
Calcium: 9.2 mg/dL (ref 8.9–10.3)
Chloride: 95 mmol/L — ABNORMAL LOW (ref 98–111)
Creatinine, Ser: 1.42 mg/dL — ABNORMAL HIGH (ref 0.44–1.00)
GFR, Estimated: 34 mL/min — ABNORMAL LOW (ref >60)
Glucose, Bld: 117 mg/dL — ABNORMAL HIGH (ref 70–99)
Potassium: 4.6 mmol/L (ref 3.5–5.1)
Sodium: 135 mmol/L (ref 135–145)
Total Bilirubin: 0.8 mg/dL (ref 0.0–1.2)
Total Protein: 6.8 g/dL (ref 6.5–8.1)

## 2024-02-17 LAB — URINALYSIS, ROUTINE W REFLEX MICROSCOPIC
Bilirubin Urine: NEGATIVE
Glucose, UA: NEGATIVE mg/dL
Hgb urine dipstick: NEGATIVE
Ketones, ur: NEGATIVE mg/dL
Leukocytes,Ua: NEGATIVE
Nitrite: NEGATIVE
Protein, ur: NEGATIVE mg/dL
Specific Gravity, Urine: 1.006 (ref 1.005–1.030)
pH: 7 (ref 5.0–8.0)

## 2024-02-17 NOTE — ED Triage Notes (Signed)
 Pt BIB [facility] from home due to still having s/s of UTI & now has developed hallucinations. Family states she is seeing, hearing things. Denies any other s/s at time of triage.

## 2024-02-18 ENCOUNTER — Emergency Department (HOSPITAL_COMMUNITY)

## 2024-02-18 ENCOUNTER — Other Ambulatory Visit

## 2024-02-18 ENCOUNTER — Other Ambulatory Visit: Payer: Self-pay | Admitting: Family Medicine

## 2024-02-18 LAB — CBG MONITORING, ED
Glucose-Capillary: 85 mg/dL (ref 70–99)
Glucose-Capillary: 221 mg/dL — ABNORMAL HIGH (ref 70–99)
Glucose-Capillary: 256 mg/dL — ABNORMAL HIGH (ref 70–99)
Glucose-Capillary: 199 mg/dL — ABNORMAL HIGH (ref 70–99)

## 2024-02-18 MED ORDER — QUETIAPINE FUMARATE 25 MG PO TABS: 25.0000 mg | ORAL_TABLET | Freq: Every evening | ORAL | 1 refills | Status: AC | PRN

## 2024-02-18 MED ORDER — ROSUVASTATIN CALCIUM 5 MG PO TABS
5.0000 mg | ORAL_TABLET | Freq: Every day | ORAL | Status: DC
  Administered 2024-02-18 – 2024-02-19 (×2): 5 mg via ORAL
  Filled 2024-02-18 (×2): qty 1

## 2024-02-18 MED ORDER — INSULIN ASPART PROT & ASPART (70-30 MIX) 100 UNIT/ML ~~LOC~~ SUSP: 50.0000 [IU] | Freq: Two times a day (BID) | SUBCUTANEOUS | Status: DC

## 2024-02-18 MED ORDER — FLUOXETINE HCL 20 MG PO CAPS
20.0000 mg | ORAL_CAPSULE | Freq: Every day | ORAL | Status: DC
  Administered 2024-02-18 – 2024-02-19 (×2): 20 mg via ORAL
  Filled 2024-02-18 (×2): qty 1

## 2024-02-18 MED ORDER — PANCRELIPASE (LIP-PROT-AMYL) 12000-38000 UNITS PO CPEP
24000.0000 [IU] | ORAL_CAPSULE | Freq: Three times a day (TID) | ORAL | Status: DC
  Administered 2024-02-18 – 2024-02-19 (×5): 24000 [IU] via ORAL
  Filled 2024-02-18 (×8): qty 2

## 2024-02-18 MED ORDER — SODIUM CHLORIDE 0.9 % IV BOLUS
500.0000 mL | Freq: Once | INTRAVENOUS | Status: AC
  Administered 2024-02-18: 500 mL via INTRAVENOUS

## 2024-02-18 MED ORDER — FUROSEMIDE 40 MG PO TABS
40.0000 mg | ORAL_TABLET | Freq: Every day | ORAL | Status: DC
  Administered 2024-02-18 – 2024-02-19 (×2): 40 mg via ORAL
  Filled 2024-02-18 (×2): qty 1

## 2024-02-18 MED ORDER — INSULIN ASPART PROT & ASPART (70-30 MIX) 100 UNIT/ML ~~LOC~~ SUSP
10.0000 [IU] | Freq: Every day | SUBCUTANEOUS | Status: DC
  Administered 2024-02-18: 10 [IU] via SUBCUTANEOUS
  Filled 2024-02-18: qty 10

## 2024-02-18 MED ORDER — FOSFOMYCIN TROMETHAMINE 3 G PO PACK
3.0000 g | PACK | Freq: Once | ORAL | Status: AC
  Administered 2024-02-18: 3 g via ORAL
  Filled 2024-02-18: qty 3

## 2024-02-18 MED ORDER — ONDANSETRON HCL 4 MG PO TABS: 4.0000 mg | ORAL_TABLET | Freq: Three times a day (TID) | ORAL | Status: DC | PRN

## 2024-02-18 MED ORDER — INSULIN ASPART PROT & ASPART (70-30 MIX) 100 UNIT/ML ~~LOC~~ SUSP
50.0000 [IU] | Freq: Every day | SUBCUTANEOUS | Status: DC
  Administered 2024-02-19: 50 [IU] via SUBCUTANEOUS
  Filled 2024-02-18: qty 10

## 2024-02-18 MED ORDER — PANTOPRAZOLE SODIUM 40 MG PO TBEC
40.0000 mg | DELAYED_RELEASE_TABLET | Freq: Every day | ORAL | Status: DC
  Administered 2024-02-18 – 2024-02-19 (×2): 40 mg via ORAL
  Filled 2024-02-18 (×2): qty 1

## 2024-02-18 MED ORDER — INSULIN ASPART 100 UNIT/ML IJ SOLN
0.0000 [IU] | Freq: Three times a day (TID) | INTRAMUSCULAR | Status: DC
  Administered 2024-02-18: 8 [IU] via SUBCUTANEOUS
  Administered 2024-02-18: 5 [IU] via SUBCUTANEOUS
  Administered 2024-02-19: 3 [IU] via SUBCUTANEOUS
  Filled 2024-02-18 (×3): qty 1

## 2024-02-18 MED ORDER — APIXABAN 2.5 MG PO TABS
2.5000 mg | ORAL_TABLET | Freq: Two times a day (BID) | ORAL | Status: DC
  Administered 2024-02-18 – 2024-02-19 (×3): 2.5 mg via ORAL
  Filled 2024-02-18 (×3): qty 1

## 2024-02-18 MED ORDER — INSULIN ASPART 100 UNIT/ML IJ SOLN: 0.0000 [IU] | Freq: Every day | INTRAMUSCULAR | Status: DC

## 2024-02-18 MED ORDER — FUROSEMIDE 40 MG PO TABS
20.0000 mg | ORAL_TABLET | Freq: Every evening | ORAL | Status: DC
  Administered 2024-02-18: 20 mg via ORAL
  Filled 2024-02-18: qty 1

## 2024-02-18 MED ORDER — METOPROLOL SUCCINATE ER 50 MG PO TB24
75.0000 mg | ORAL_TABLET | Freq: Two times a day (BID) | ORAL | Status: DC
  Administered 2024-02-18 – 2024-02-19 (×3): 75 mg via ORAL
  Filled 2024-02-18 (×3): qty 1

## 2024-02-18 MED ORDER — LORAZEPAM 0.5 MG PO TABS
0.5000 mg | ORAL_TABLET | Freq: Every day | ORAL | Status: DC
  Administered 2024-02-18 (×2): 0.5 mg via ORAL
  Filled 2024-02-18 (×2): qty 1

## 2024-02-18 MED ORDER — FAMOTIDINE 20 MG PO TABS
40.0000 mg | ORAL_TABLET | Freq: Every day | ORAL | Status: DC
  Administered 2024-02-18 – 2024-02-19 (×2): 40 mg via ORAL
  Filled 2024-02-18 (×2): qty 2

## 2024-02-18 NOTE — Evaluation (Signed)
 Physical Therapy Evaluation Patient Details Name: Misty Edwards MRN: [id number] DOB: [birth year] Today's Date: 02/18/2024  History of Present Illness  Misty Edwards is a 89 y.o. female.        Patient presents with concerns over urinary tract infection.  Patient reports that she is experiencing urinary frequency and dysuria, consistent with urinary tract infection she has had in the past.  Family were concerned that she was hallucinating.  Patient reports that she was not hallucinating, did find herself in a situation where she was having trouble membrane things.  At time of evaluation she reports that she has no complaints other than she cannot sleep in this bed.   Clinical Impression  Patient demonstrates slow labored movement for sitting up at bedside with c/o pain/discomfort in legs, once seated able to maintain sitting balance, but very unsteady on feet and limited to a few unsteady labored side steps before having to sit due to BLE weakness and fall risk. Patient will benefit from continued skilled physical therapy in hospital and recommended venue below to increase strength, balance, endurance for safe ADLs and gait.          If plan is discharge home, recommend the following: A lot of help with walking and/or transfers;A lot of help with bathing/dressing/bathroom;Assistance with cooking/housework;Direct supervision/assist for medications management;Direct supervision/assist for financial management;Assist for transportation;Supervision due to cognitive status;Help with stairs or ramp for entrance   Can travel by private vehicle   No    Equipment Recommendations None recommended by PT  Recommendations for Other Services       Functional Status Assessment Patient has had a recent decline in their functional status and demonstrates the ability to make significant improvements in function in a reasonable and predictable amount of time.     Precautions / Restrictions  Precautions Precautions: Fall Recall of Precautions/Restrictions: Impaired Restrictions Weight Bearing Restrictions Per Provider Order: No      Mobility  Bed Mobility Overal bed mobility: Needs Assistance Bed Mobility: Supine to Sit, Sit to Supine     Supine to sit: Mod assist Sit to supine: Min assist   General bed mobility comments: slow labored movement with diffiuclty moving BLE    Transfers Overall transfer level: Needs assistance Equipment used: Rolling walker (2 wheels) Transfers: Sit to/from Stand, Bed to chair/wheelchair/BSC Sit to Stand: Min assist, Mod assist   Step pivot transfers: Mod assist       General transfer comment: unsteady labored movement    Ambulation/Gait Ambulation/Gait assistance: Mod assist Gait Distance (Feet): 5 Feet Assistive device: Rolling walker (2 wheels) Gait Pattern/deviations: Decreased step length - left, Decreased step length - right, Decreased stride length, Trunk flexed, Knees buckling Gait velocity: slow     General Gait Details: limited to a few slow labored side steps at bedside before having to sit due to c/o fatigue, weakness  Stairs            Wheelchair Mobility     Tilt Bed    Modified Rankin (Stroke Patients Only)       Balance Overall balance assessment: Needs assistance Sitting-balance support: Feet supported, No upper extremity supported Sitting balance-Leahy Scale: Fair Sitting balance - Comments: fair/good seated at EOB   Standing balance support: Reliant on assistive device for balance, During functional activity, Bilateral upper extremity supported Standing balance-Leahy Scale: Poor Standing balance comment: using RW  Pertinent Vitals/Pain Pain Assessment Pain Assessment: Faces Faces Pain Scale: Hurts little more Pain Location: BLE with movement Pain Descriptors / Indicators: Discomfort, Sore Pain Intervention(s): Limited activity within  patient's tolerance, Monitored during session, Repositioned    Home Living Family/patient expects to be discharged to:: Private residence Living Arrangements: Other relatives Available Help at Discharge: Personal care attendant;Family;Available PRN/intermittently Type of Home: Mobile home Home Access: Ramped entrance       Home Layout: One level Home Equipment: Agricultural Consultant (2 wheels);Rollator (4 wheels);BSC/3in1;Wheelchair - manual;Hospital bed;Shower seat;Grab bars - tub/shower Additional Comments: Patient legally blind    Prior Function Prior Level of Function : Needs assist;History of Falls (last six months)       Physical Assist : ADLs (physical);Mobility (physical) Mobility (physical): Bed mobility;Transfers;Gait;Stairs   Mobility Comments: Household ambulator with RW       Extremity/Trunk Assessment   Upper Extremity Assessment Upper Extremity Assessment: Generalized weakness    Lower Extremity Assessment Lower Extremity Assessment: Generalized weakness    Cervical / Trunk Assessment Cervical / Trunk Assessment: Kyphotic  Communication   Communication Communication: No apparent difficulties    Cognition Arousal: Alert Behavior During Therapy: Anxious, Lability   PT - Cognitive impairments: No family/caregiver present to determine baseline                       PT - Cognition Comments: Patient easily fustrated Following commands: Impaired Following commands impaired: Follows one step commands with increased time     Cueing Cueing Techniques: Verbal cues, Tactile cues     General Comments      Exercises     Assessment/Plan    PT Assessment Patient needs continued PT services  PT Problem List Decreased activity tolerance;Decreased balance;Decreased mobility;Decreased strength       PT Treatment Interventions DME instruction;Gait training;Stair training;Functional mobility training;Therapeutic activities;Therapeutic exercise;Balance  training;Cognitive remediation;Patient/family education    PT Goals (Current goals can be found in the Care Plan section)  Acute Rehab PT Goals Patient Stated Goal: return home with family to assist PT Goal Formulation: With patient Time For Goal Achievement: 03/03/24    Frequency Min 2X/week     Co-evaluation               AM-PAC PT 6 Clicks Mobility  Outcome Measure Help needed turning from your back to your side while in a flat bed without using bedrails?: A Little Help needed moving from lying on your back to sitting on the side of a flat bed without using bedrails?: A Lot Help needed moving to and from a bed to a chair (including a wheelchair)?: A Lot Help needed standing up from a chair using your arms (e.g., wheelchair or bedside chair)?: A Lot Help needed to walk in hospital room?: A Lot Help needed climbing 3-5 steps with a railing? : Total 6 Click Score: 12    End of Session   Activity Tolerance: Patient tolerated treatment well;Patient limited by fatigue Patient left: in bed;with call bell/phone within reach Nurse Communication: Mobility status PT Visit Diagnosis: Other abnormalities of gait and mobility (R26.89);Muscle weakness (generalized) (M62.81);Difficulty in walking, not elsewhere classified (R26.2)    Time: 8873-8841 PT Time Calculation (min) (ACUTE ONLY): 32 min   Charges:   PT Evaluation $PT Eval Moderate Complexity: 1 Mod PT Treatments $Therapeutic Activity: 23-37 mins PT General Charges $$ ACUTE PT VISIT: 1 Visit         2:31 PM, 02/18/24 Lynwood Music, MPT Physical Therapist with  [facility] 336 [phone] office [phone] mobile phone

## 2024-02-18 NOTE — ED Notes (Addendum)
 Spoke with pt's grandson, Ozell, and confirmed plan for STR. Pt's grandson prefers placement in [place]. Referrals have been faxed out and currently awaiting bed offers. CSW will request auth once PT note is in chart.   ADDENDUM: Reviewed bed offer with pt's grandson, who accepted offer for SNF at [facility]. Insurance shara has been requested through [facility].     [facility] [address] [place], [place] [address] [phone] Overall rating ?

## 2024-02-18 NOTE — ED Provider Notes (Signed)
 [place] EMERGENCY DEPARTMENT AT [facility] Provider Note   CSN: [id number] Arrival date & time: 02/17/24  2228     Patient presents with: Hallucinations and Urinary Frequency   Misty Edwards is a 89 y.o. female.   Patient presents with concerns over urinary tract infection.  Patient reports that she is experiencing urinary frequency and dysuria, consistent with urinary tract infection she has had in the past.  Family were concerned that she was hallucinating.  Patient reports that she was not hallucinating, did find herself in a situation where she was having trouble membrane things.  At time of evaluation she reports that she has no complaints other than she cannot sleep in this bed.       Prior to Admission medications   Medication Sig Start Date End Date Taking? Authorizing Provider  acetaminophen  (TYLENOL ) 325 MG tablet Take 650 mg by mouth every 6 (six) hours as needed for mild pain (pain score 1-3).    [provider]  albuterol  (VENTOLIN  HFA) 108 (90 Base) MCG/ACT inhaler Inhale 1-2 puffs into the lungs every 6 (six) hours as needed for wheezing or shortness of breath. Patient not taking: Reported on 02/15/2024 04/24/23   Aletha Bene, MD  alum & mag hydroxide-simeth (MAALOX/MYLANTA) 200-200-20 MG/5ML suspension Take 30 mLs by mouth every 6 (six) hours as needed for indigestion or heartburn.    [provider]  apixaban  (ELIQUIS ) 2.5 MG TABS tablet TAKE 1 TABLET BY MOUTH TWICE DAILY 11/26/23   Alvan Dorn FALCON, MD  Blood Glucose Monitoring Suppl (BLOOD GLUCOSE SYSTEM PAK) KIT Please dispense as One Touch Ultra. Use as directed to monitor FSBS 4x daily. Dx: E11.65 11/16/20   Duanne Butler DASEN, MD  calcium  carbonate (TUMS - DOSED IN MG ELEMENTAL CALCIUM ) 500 MG chewable tablet Chew 3 tablets by mouth as needed for heartburn or indigestion.    [provider]  cetirizine  (ZYRTEC ) 10 MG tablet TAKE 1 TABLET BY MOUTH DAILY 11/26/23   Duanne Butler DASEN, MD  Continuous Blood Gluc Receiver (FREESTYLE LIBRE 3 READER) DEVI 1 Device by Does not apply route daily. Use to continuously check blood sugar daily. 05/16/22   Duanne Butler DASEN, MD  Continuous Glucose Sensor (FREESTYLE LIBRE 3 SENSOR) MISC USE TO CHECK BLOOD GLUCOSE LEVELS CONTINUOUSLY AND CHANGE EVERY 14 DAYS 11/27/23   Duanne Butler DASEN, MD  CREON  [phone] units CPEP TAKE 2 CAPSULES BY MOUTH WITH MEALS AND 1 CAP WITH SNACKS x 2 02/10/24   Duanne Butler DASEN, MD  estradiol  (ESTRACE  VAGINAL) 0.1 MG/GM vaginal cream Place 1 Applicatorful vaginally at bedtime. 12/03/23   Duanne Butler DASEN, MD  famotidine  (PEPCID ) 40 MG tablet Take 1 tablet (40 mg total) by mouth daily. 02/10/24   Duanne Butler DASEN, MD  ferrous sulfate  325 (65 FE) MG tablet Take 325 mg by mouth daily with breakfast.    [provider]  FLUoxetine  (PROZAC ) 20 MG capsule TAKE 1 CAPSULE BY MOUTH ONCE DAILY 06/09/23   Duanne Butler DASEN, MD  furosemide  (LASIX ) 20 MG tablet Take 1 tablet (20 mg total) by mouth See admin instructions. Take 40 mg in the morning and 20 mg at 4 pm. 04/24/23   Duanne Butler DASEN, MD  insulin  aspart protamine - aspart (NOVOLOG  MIX 70/30 RELION) (70-30) 100 UNIT/ML injection 50 units in am, 8 units in pm Patient taking differently: Inject 50 Units into the skin 2 (two) times daily with a meal. 50 units in am, 15 units at night per  family report 04/17/23   Duanne Butler DASEN, MD  insulin  regular (HUMULIN  R) 100 units/mL injection Check 3 times a day before meals and administer insulin  based on her sugars Regular insulin   sugar 0 units  < 150 2 units 151-200 4 units 201-250 6 units 251-300 8 units 301-350 10 units 351+ 02/10/24   Duanne Butler DASEN, MD  Insulin  Syringe-Needle U-100 (BD INSULIN  SYRINGE U/F) 31G X 5/16 0.5 ML MISC USE AS DIRECTED TO ADMINISTER INSULIN  TWICE DAILY 04/23/23   Duanne Butler DASEN, MD  Insulin  Syringes, Disposable, U-100 1 ML MISC Use as directed to administer insulin  daily.  11/18/22   Duanne Butler DASEN, MD  Lancets Misc. MISC Pt has Accu Chek Aviva Plus - Needs just lancets  Checks BS 4-5 times per day. Disp 3 boxes(90 day Supply)/4 refills Dx code. E11.9 10/13/19   Duanne Butler DASEN, MD  LORazepam  (ATIVAN ) 1 MG tablet TAKE 1/2 TABLET BY MOUTH AT BEDTIME 02/09/24   Duanne Butler DASEN, MD  magnesium  oxide (MAG-OX) 400 MG tablet Take 1 tablet (400 mg total) by mouth daily. 02/10/24   Duanne Butler DASEN, MD  metoprolol  succinate (TOPROL -XL) 25 MG 24 hr tablet Take 3 tablets (75 mg total) by mouth 2 (two) times daily. 02/05/24   Amin, Sumayya, MD  Multiple Vitamin (MULTIVITAMIN) capsule Take 1 capsule by mouth every morning.     [provider]  nystatin  cream (MYCOSTATIN ) Apply 1 Application topically 3 (three) times daily. 04/17/23   Duanne Butler DASEN, MD  ondansetron  (ZOFRAN ) 4 MG tablet Take 1 tablet (4 mg total) by mouth every 8 (eight) hours as needed for nausea or vomiting. 04/23/23   Duanne Butler DASEN, MD  OneTouch Delica Lancets 33G MISC CHECK BLOOD SUGAR 4-5 TIMES PER DAY 10/22/20   Duanne Butler DASEN, MD  [facility] ULTRA test strip USE 1 STRIP TO CHECK GLUCOSE FIVE TIMES DAILY 06/18/23   Duanne Butler DASEN, MD  pantoprazole  (PROTONIX ) 40 MG tablet Take 1 tablet (40 mg total) by mouth daily. 04/23/23   Duanne Butler DASEN, MD  potassium chloride  (KLOR-CON ) 10 MEQ tablet TAKE 1 TABLET BY MOUTH DAILY 01/29/24   Strader, Brittany M, PA-C  Propylene Glycol-Glycerin (SOOTHE OP) Apply 2 drops to eye 2 (two) times daily.    [provider]  rosuvastatin  (CRESTOR ) 5 MG tablet TAKE 1 TABLET BY MOUTH EACH EVENING 09/03/23   Duanne Butler DASEN, MD  triamcinolone  cream (KENALOG ) 0.1 % Apply topically 2 (two) times daily. 12/03/23   Duanne Butler DASEN, MD    Allergies: Morphine  and codeine, Calcium -containing compounds, Raloxifene , and Vitamin d analogs    Review of Systems  Updated Vital Signs BP (!) 164/97 (BP Location: Left Arm)   Pulse 80   Temp 98.1 F (36.7 C) (Oral)    Resp 18   Ht 5' 3 (1.6 m)   Wt 70.6 kg   SpO2 94%   BMI 27.56 kg/m   Physical Exam Vitals and nursing note reviewed.  Constitutional:      General: She is not in acute distress.    Appearance: She is well-developed.  HENT:     Head: Normocephalic and atraumatic.     Mouth/Throat:     Mouth: Mucous membranes are moist.  Eyes:     General: Vision grossly intact. Gaze aligned appropriately.     Extraocular Movements: Extraocular movements intact.     Conjunctiva/sclera: Conjunctivae normal.  Cardiovascular:     Rate and Rhythm: Normal rate and regular rhythm.  Pulses: Normal pulses.     Heart sounds: Normal heart sounds, S1 normal and S2 normal. No murmur heard.    No friction rub. No gallop.  Pulmonary:     Effort: Pulmonary effort is normal. No respiratory distress.     Breath sounds: Normal breath sounds.  Abdominal:     General: Bowel sounds are normal.     Palpations: Abdomen is soft.     Tenderness: There is no abdominal tenderness. There is no guarding or rebound.     Hernia: No hernia is present.  Musculoskeletal:        General: No swelling.     Cervical back: Full passive range of motion without pain, normal range of motion and neck supple. No spinous process tenderness or muscular tenderness. Normal range of motion.     Right lower leg: No edema.     Left lower leg: No edema.  Skin:    General: Skin is warm and dry.     Capillary Refill: Capillary refill takes less than 2 seconds.     Findings: No ecchymosis, erythema, rash or wound.  Neurological:     General: No focal deficit present.     Mental Status: She is alert and oriented to person, place, and time.     GCS: GCS eye subscore is 4. GCS verbal subscore is 5. GCS motor subscore is 6.     Cranial Nerves: Cranial nerves 2-12 are intact.     Sensory: Sensation is intact.     Motor: Motor function is intact.     Coordination: Coordination is intact.  Psychiatric:        Attention and Perception:  Attention normal.        Mood and Affect: Mood normal.        Speech: Speech normal.        Behavior: Behavior normal.     (all labs ordered are listed, but only abnormal results are displayed) Labs Reviewed  CBC WITH DIFFERENTIAL/PLATELET - Abnormal; Notable for the following components:      Result Value   Platelets 130 (*)    All other components within normal limits  COMPREHENSIVE METABOLIC PANEL WITH GFR - Abnormal; Notable for the following components:   Chloride 95 (*)    Glucose, Bld 117 (*)    BUN 27 (*)    Creatinine, Ser 1.42 (*)    AST 86 (*)    ALT 109 (*)    GFR, Estimated 34 (*)    All other components within normal limits  URINALYSIS, ROUTINE W REFLEX MICROSCOPIC - Abnormal; Notable for the following components:   Color, Urine STRAW (*)    All other components within normal limits  URINE CULTURE    EKG: None  Radiology: CT ABDOMEN PELVIS WO CONTRAST Result Date: 02/18/2024 EXAM: CT ABDOMEN AND PELVIS WITHOUT CONTRAST 02/18/2024 12:48:03 AM TECHNIQUE: CT of the abdomen and pelvis was performed without the administration of intravenous contrast. Multiplanar reformatted images are provided for review. Automated exposure control, iterative reconstruction, and/or weight-based adjustment of the mA/kV was utilized to reduce the radiation dose to as low as reasonably achievable. COMPARISON: 09/03/2018 and 09/26/2016. CLINICAL HISTORY: UTI, recurrent/complicated (Female) FINDINGS: LOWER CHEST: Small bilateral pleural effusions. Mild cardiomegaly. LIVER: The liver is unremarkable. GALLBLADDER AND BILE DUCTS: Status post cholecystectomy. Additional calcified area of remote fat necrosis noted within the gallbladder fossa. No biliary ductal dilatation. SPLEEN: No acute abnormality. PANCREAS: Severe atrophy of the distal body and tail of the pancreas,  unchanged from prior examination. Residual head and proximal body of the pancreas are unremarkable. ADRENAL GLANDS: No acute  abnormality. KIDNEYS, URETERS AND BLADDER: The kidneys are normal in size and position. Mild bilateral nonspecific perinephric stranding. No hydronephrosis. No intrarenal or ureteral calculi. No perinephric fluid collections. The bladder is distended but is otherwise unremarkable. GI AND BOWEL: The appendix is normal. The stomach, small bowel, and large bowel are otherwise unremarkable. PERITONEUM AND RETROPERITONEUM: Fat necrosis within the abdomen. The small bowel mesentery now demonstrates dense rim calcifications. No free intraperitoneal gas or fluid. VASCULATURE: Extensive aortoiliac atherosclerotic calcification with particularly prominent atherosclerotic calcification seen at the origin of the renal vasculature bilaterally. LYMPH NODES: No lymphadenopathy. REPRODUCTIVE ORGANS: No acute abnormality. BONES AND SOFT TISSUES: Minimal development of a remote-appearing severe T12 wedge compression deformity with approximately 60-70% loss of height anteriorly. 3 mm retropulsion of the posterior superior aspect of the vertebral body. Superimposed degenerative changes are seen throughout the visualized thoracolumbar spine and hips bilaterally. No acute bone abnormality. No lytic or blastic bone lesions. No focal soft tissue abnormality. IMPRESSION: 1. No acute findings in the abdomen or pelvis related to the recurrent/complicated UTI. Bladder distention noted . 2. Severe atrophy of the distal body and tail of the pancreas, unchanged from prior examination. 3. Extensive aortoiliac atherosclerotic calcification with particularly prominent atherosclerotic calcification at the origin of the renal vasculature bilaterally. If there is clinical evidence of hemodynamically significant renal artery stenosis, CT arteriography may be more helpful for further evaluation. 4. Remote T12 anterior wedge compression deformity Electronically signed by: Dorethia Molt MD 02/18/2024 01:13 AM EST RP Workstation: [id number]   CT HEAD WO  CONTRAST ( ) Result Date: 02/18/2024 EXAM: CT HEAD WITHOUT CONTRAST 02/18/2024 12:48:03 AM TECHNIQUE: CT of the head was performed without the administration of intravenous contrast. Automated exposure control, iterative reconstruction, and/or weight based adjustment of the mA/kV was utilized to reduce the radiation dose to as low as reasonably achievable. COMPARISON: 04/09/2023 CLINICAL HISTORY: Mental status change, unknown cause. FINDINGS: BRAIN AND VENTRICLES: No acute hemorrhage. No evidence of acute infarct. No hydrocephalus. No extra-axial collection. No mass effect or midline shift. Chronic ischemic white matter changes. ORBITS: No acute abnormality. SINUSES: No acute abnormality. SOFT TISSUES AND SKULL: No acute soft tissue abnormality. No skull fracture. IMPRESSION: 1. No acute intracranial abnormality. 2. Chronic ischemic white matter changes Electronically signed by: Franky Stanford MD 02/18/2024 12:54 AM EST RP Workstation: [id number]     Procedures   Medications Ordered in the ED  sodium chloride  0.9 % bolus 500 mL (500 mLs Intravenous New Bag/Given 02/18/24 0037)  fosfomycin (MONUROL) packet 3 g (3 g Oral Given 02/18/24 0038)                                    Medical Decision Making Amount and/or Complexity of Data Reviewed Labs: ordered. Radiology: ordered.  Risk Prescription drug management.   Differential diagnosis considered includes, but not limited to: TIA; Stroke; ICH; Seizure; electrolyte abnormality; hypoglycemia; toxic/pharmacologic causes; CNS infection; psychiatric disorder  Presents to the emergency department with concerns over urinary tract infection.  Patient reports that she has having urinary frequency and dysuria.  Patient did see her doctor 3 days ago with confusion and urinary symptoms, urinalysis at that time was largely unremarkable.  Culture grew mixed genital flora, no pathogens.  He was not treated at that time.  Urinalysis today, once again does not  appear grossly infected.  Will reculture.  Had blood work performed by her PCP, was found to have mildly elevated transaminases.  Reviewing records, she has an outpatient CT ordered that appears to be scheduled for later today.  This was performed while here, no acute abnormality.  Head CT also performed, no acute abnormality.  Agree with PCPs original conclusion that patient's confusion is likely secondary to advanced age and her recent hospitalization.  Having said this, with her symptoms, I do not see any harm giving her a dose of fosfomycin.     Final diagnoses:  Dysuria    ED Discharge Orders     None          Haze Lonni PARAS, MD 02/18/24 [phone]

## 2024-02-18 NOTE — Discharge Instructions (Addendum)
 Transfer patient to [facility]

## 2024-02-18 NOTE — ED Notes (Signed)
 Pt up to bedside commode with 2 nurses at bedside. Pt required minimal assistance.

## 2024-02-18 NOTE — Plan of Care (Signed)
  Problem: Acute Rehab PT Goals(only PT should resolve) Goal: Pt Will Go Supine/Side To Sit Outcome: Progressing Flowsheets (Taken 02/18/2024 1433) Pt will go Supine/Side to Sit: with minimal assist Goal: Patient Will Transfer Sit To/From Stand Outcome: Progressing Flowsheets (Taken 02/18/2024 1433) Patient will transfer sit to/from stand: with minimal assist Goal: Pt Will Transfer Bed To Chair/Chair To Bed Outcome: Progressing Flowsheets (Taken 02/18/2024 1433) Pt will Transfer Bed to Chair/Chair to Bed: with min assist Goal: Pt Will Ambulate Outcome: Progressing Flowsheets (Taken 02/18/2024 1433) Pt will Ambulate:  25 feet  with minimal assist  with moderate assist  with rolling walker   2:33 PM, 02/18/24 Lynwood Music, MPT Physical Therapist with [facility] [phone] office [phone] mobile phone

## 2024-02-18 NOTE — ED Notes (Signed)
 Spoke with pt's grandson Ozell, notifying of discharge. Ozell states pt is not ready for discharge, that he isn't coming to get her, he will not be present at the home to allow her in if we arrange transport & that she is not safe to be home alone. Ozell became very upset verbally raising his voice on phone & disconnecting the line without positive resolution for pt's safe discharge home. EDP made aware.

## 2024-02-18 NOTE — [place] FL2 (Signed)
 [place]  MEDICAID FL2 LEVEL OF CARE FORM     IDENTIFICATION  Patient Name: Misty Edwards Birthdate: [birth year] Sex: female Admission Date (Current Location): 02/17/2024  [facility] and [place] Number:  [employer] and Address:  [facility],  [address], [place] [address]      Provider Number: [id number]  Attending Physician Name and Address:  Freddi Hamilton, MD  Relative Name and Phone Number:  Barbaraann Ozell Mires)  [phone]    Current Level of Care: Hospital Recommended Level of Care: Skilled Nursing Facility Prior Approval Number:    Date Approved/Denied:   PASRR Number: [id number]  Discharge Plan: SNF    Current Diagnoses: Patient Active Problem List   Diagnosis Date Noted   Acute metabolic encephalopathy 02/02/2024   Atrial fibrillation with rapid ventricular response (HCC) 02/02/2024   Urinary tract infection 01/31/2024   Upper respiratory infection, viral 04/24/2023   Hyperglycemia due to diabetes mellitus (HCC) 04/09/2023   Hyperkalemia 04/09/2023   Dehydration 04/09/2023   Fall at home, initial encounter 04/09/2023   Scalp hematoma 04/09/2023   Prolonged QT interval 04/09/2023   Mixed hyperlipidemia 04/09/2023   Chronic heart failure with preserved ejection fraction (HFpEF) (HCC) 04/09/2023   COVID-19 virus infection 04/15/2022   Hypomagnesemia 03/13/2022   Acute on chronic congestive heart failure (HCC) 03/11/2022   Coronary artery disease involving coronary bypass graft of native heart without angina pectoris 03/04/2022   Nonrheumatic mitral valve regurgitation 03/03/2022   Acute exacerbation of CHF (congestive heart failure) (HCC) 03/01/2022   Persistent atrial fibrillation with RVR (HCC) 03/01/2022   Exocrine pancreatic insufficiency 10/04/2021   Heartburn 10/04/2021   Acute pulmonary edema (HCC)    Acute respiratory failure (HCC) 09/23/2019   Palliative care by specialist    Advanced care planning/counseling  discussion    Goals of care, counseling/discussion    Disorientation    Acute kidney injury superimposed on chronic kidney disease 09/13/2018   Hyponatremia 09/13/2018   Herpes zoster ophthalmicus 09/03/2018   Right acetabular fracture (HCC) 09/03/2018   Syncope and collapse 09/03/2018   Protein-calorie malnutrition, severe 09/03/2018   Vaginal atrophy 04/29/2018   Angiokeratoma 04/29/2018   PMB (postmenopausal bleeding) 04/29/2018   Acute on chronic diastolic CHF (congestive heart failure) (HCC) 10/23/2016   Acute respiratory failure with hypoxia (HCC) 10/23/2016   Nephrolithiasis 09/27/2016   Hydronephrosis with renal and ureteral calculus obstruction 09/27/2016   Hydronephrosis 09/27/2016   Heme positive stool 09/05/2016   Dyslipidemia 03/07/2015   Type 2 diabetes mellitus with vascular disease (HCC) 03/07/2015   Anemia 09/26/2014   Lesion of lip 09/25/2014   S/P CABG x 3 08/25/2014   NSTEMI (non-ST elevated myocardial infarction) (HCC) 08/24/2014   Acute coronary syndrome (HCC)    IBS (irritable bowel syndrome) 08/26/2012   Thrombocytopenia 02/02/2012   Epigastric pain 02/02/2012   Gastritis 02/02/2012   Normocytic anemia 12/17/2011   History of colon polyps 12/17/2011   Constipation 12/17/2011   Family history of colon cancer 08/22/2011   LLQ pain 08/21/2011   Chronic pancreatitis (HCC) 09/12/2008   HELICOBACTER PYLORI INFECTION 01/26/2008   ADENOCARCINOMA, BREAST 01/26/2008   Essential hypertension 01/26/2008   External hemorrhoids 01/26/2008   GERD 01/26/2008   PSEUDOCYST, PANCREAS 01/26/2008   GALLSTONE PANCREATITIS 01/26/2008   GI BLEEDING 01/26/2008   UTI (urinary tract infection) 01/26/2008   NAUSEA 01/26/2008   VOMITING 01/26/2008   DIARRHEA 01/26/2008    Orientation RESPIRATION BLADDER Height & Weight     Self, Place, Situation  Normal Incontinent Weight: 155 lb 9.6 oz (70.6 kg) Height:  5' 3 (160 cm)  BEHAVIORAL SYMPTOMS/MOOD NEUROLOGICAL BOWEL  NUTRITION STATUS      Incontinent Diet (Heart healthy)  AMBULATORY STATUS COMMUNICATION OF NEEDS Skin   Limited Assist Verbally Normal                       Personal Care Assistance Level of Assistance  Bathing, Feeding, Dressing Bathing Assistance: Limited assistance Feeding assistance: Limited assistance Dressing Assistance: Limited assistance     Functional Limitations Info  Sight, Hearing, Speech Sight Info: Impaired (eyeglasses) Hearing Info: Impaired Speech Info: Adequate    SPECIAL CARE FACTORS FREQUENCY  PT (By licensed PT), OT (By licensed OT)     PT Frequency: 5x/wk OT Frequency: 5x/wk            Contractures Contractures Info: Not present    Additional Factors Info  Code Status, Allergies, Psychotropic, Insulin  Sliding Scale Code Status Info: Full Allergies Info: Morphine  And Codeine, Calcium -containing Compounds, Raloxifene , Vitamin D Analogs Psychotropic Info: Prozac , Ativan  Insulin  Sliding Scale Info: See MAR       Current Medications (02/18/2024):  This is the current hospital active medication list Current Facility-Administered Medications  Medication Dose Route Frequency Provider Last Rate Last Admin   apixaban  (ELIQUIS ) tablet 2.5 mg  2.5 mg Oral BID Pollina, Christopher J, MD   2.5 mg at 02/18/24 9052   famotidine  (PEPCID ) tablet 40 mg  40 mg Oral Daily Pollina, Christopher J, MD   40 mg at 02/18/24 0945   FLUoxetine  (PROZAC ) capsule 20 mg  20 mg Oral Daily Pollina, Christopher J, MD   20 mg at 02/18/24 9053   furosemide  (LASIX ) tablet 20 mg  20 mg Oral QPM Pollina, Lonni PARAS, MD       furosemide  (LASIX ) tablet 40 mg  40 mg Oral Daily Pollina, Lonni PARAS, MD   40 mg at 02/18/24 0945   insulin  aspart (novoLOG ) injection 0-15 Units  0-15 Units Subcutaneous TID WC Pollina, Lonni PARAS, MD       insulin  aspart (novoLOG ) injection 0-5 Units  0-5 Units Subcutaneous QHS Pollina, Lonni PARAS, MD       NOREEN ON 02/19/2024] insulin  aspart  protamine - aspart (NOVOLOG  MIX 70/30) injection 50 Units  50 Units Subcutaneous Q breakfast Freddi Hamilton, MD       And   insulin  aspart protamine - aspart (NOVOLOG  MIX 70/30) injection 10 Units  10 Units Subcutaneous Q supper Freddi Hamilton, MD       lipase/protease/amylase (CREON ) capsule 24,000 Units  24,000 Units Oral TID AC Pollina, Christopher J, MD   24,000 Units at 02/18/24 0945   LORazepam  (ATIVAN ) tablet 0.5 mg  0.5 mg Oral QHS Pollina, Christopher J, MD   0.5 mg at 02/18/24 9656   metoprolol  succinate (TOPROL -XL) 24 hr tablet 75 mg  75 mg Oral BID Pollina, Christopher J, MD   75 mg at 02/18/24 0946   ondansetron  (ZOFRAN ) tablet 4 mg  4 mg Oral Q8H PRN Pollina, Christopher J, MD       pantoprazole  (PROTONIX ) EC tablet 40 mg  40 mg Oral Daily Pollina, Christopher J, MD   40 mg at 02/18/24 9053   rosuvastatin  (CRESTOR ) tablet 5 mg  5 mg Oral Daily Pollina, Christopher J, MD   5 mg at 02/18/24 0945   Current Outpatient Medications  Medication Sig Dispense Refill   acetaminophen  (TYLENOL ) 325 MG tablet Take 650 mg by mouth every 6 (six)  hours as needed for mild pain (pain score 1-3).     alum & mag hydroxide-simeth (MAALOX/MYLANTA) 200-200-20 MG/5ML suspension Take 30 mLs by mouth every 6 (six) hours as needed for indigestion or heartburn.     apixaban  (ELIQUIS ) 2.5 MG TABS tablet TAKE 1 TABLET BY MOUTH TWICE DAILY 180 tablet 1   calcium  carbonate (TUMS - DOSED IN MG ELEMENTAL CALCIUM ) 500 MG chewable tablet Chew 3 tablets by mouth as needed for heartburn or indigestion.     cetirizine  (ZYRTEC ) 10 MG tablet TAKE 1 TABLET BY MOUTH DAILY 90 tablet 11   CREON  24000-76000 units CPEP TAKE 2 CAPSULES BY MOUTH WITH MEALS AND 1 CAP WITH SNACKS x 2 240 capsule 11   estradiol  (ESTRACE  VAGINAL) 0.1 MG/GM vaginal cream Place 1 Applicatorful vaginally at bedtime. 42.5 g 12   famotidine  (PEPCID ) 40 MG tablet Take 1 tablet (40 mg total) by mouth daily. 90 tablet 1   ferrous sulfate  325 (65 FE) MG tablet  Take 325 mg by mouth daily with breakfast.     FLUoxetine  (PROZAC ) 20 MG capsule TAKE 1 CAPSULE BY MOUTH ONCE DAILY 90 capsule 10   insulin  aspart protamine - aspart (NOVOLOG  MIX 70/30 RELION) (70-30) 100 UNIT/ML injection 50 units in am, 8 units in pm (Patient taking differently: Inject 50 Units into the skin 2 (two) times daily with a meal. 50 units in am, 10 units at night per family report)     insulin  regular (HUMULIN  R) 100 units/mL injection Check 3 times a day before meals and administer insulin  based on her sugars Regular insulin   sugar 0 units  < 150 2 units 151-200 4 units 201-250 6 units 251-300 8 units 301-350 10 units 351+ 10 mL 11   LORazepam  (ATIVAN ) 1 MG tablet TAKE 1/2 TABLET BY MOUTH AT BEDTIME 30 tablet 4   magnesium  oxide (MAG-OX) 400 MG tablet Take 1 tablet (400 mg total) by mouth daily. 90 tablet 1   metoprolol  succinate (TOPROL -XL) 25 MG 24 hr tablet Take 3 tablets (75 mg total) by mouth 2 (two) times daily. 180 tablet 1   Multiple Vitamin (MULTIVITAMIN) capsule Take 1 capsule by mouth every morning.      nystatin  cream (MYCOSTATIN ) Apply 1 Application topically 3 (three) times daily. 30 g 0   pantoprazole  (PROTONIX ) 40 MG tablet Take 1 tablet (40 mg total) by mouth daily. 90 tablet 3   potassium chloride  (KLOR-CON ) 10 MEQ tablet TAKE 1 TABLET BY MOUTH DAILY 90 tablet 0   Propylene Glycol-Glycerin (SOOTHE OP) Apply 2 drops to eye 2 (two) times daily.     triamcinolone  cream (KENALOG ) 0.1 % Apply topically 2 (two) times daily. 60 g 10   Blood Glucose Monitoring Suppl (BLOOD GLUCOSE SYSTEM PAK) KIT Please dispense as One Touch Ultra. Use as directed to monitor FSBS 4x daily. Dx: E11.65 1 kit 1   Continuous Blood Gluc Receiver (FREESTYLE LIBRE 3 READER) DEVI 1 Device by Does not apply route daily. Use to continuously check blood sugar daily. 1 each 1   Continuous Glucose Sensor (FREESTYLE LIBRE 3 SENSOR) MISC USE TO CHECK BLOOD GLUCOSE LEVELS CONTINUOUSLY AND CHANGE EVERY 14  DAYS 6 each 0   furosemide  (LASIX ) 20 MG tablet Take 1 tablet (20 mg total) by mouth See admin instructions. Take 40 mg in the morning and 20 mg at 4 pm.     Insulin  Syringe-Needle U-100 (BD INSULIN  SYRINGE U/F) 31G X 5/16 0.5 ML MISC USE AS DIRECTED TO ADMINISTER INSULIN  TWICE  DAILY 300 each 1   Insulin  Syringes, Disposable, U-100 1 ML MISC Use as directed to administer insulin  daily. 100 each 3   Lancets Misc. MISC Pt has Accu Chek Aviva Plus - Needs just lancets  Checks BS 4-5 times per day. Disp 3 boxes(90 day Supply)/4 refills Dx code. E11.9 450 each 3   OneTouch Delica Lancets 33G MISC CHECK BLOOD SUGAR 4-5 TIMES PER DAY 300 each 0   ONETOUCH ULTRA test strip USE 1 STRIP TO CHECK GLUCOSE FIVE TIMES DAILY 300 each 10   rosuvastatin  (CRESTOR ) 5 MG tablet TAKE 1 TABLET BY MOUTH EACH EVENING 90 tablet 11     Discharge Medications: Please see discharge summary for a list of discharge medications.  Relevant Imaging Results:  Relevant Lab Results:   Additional Information SSN: [id number]  Hoy DELENA Bigness, LCSW

## 2024-02-18 NOTE — ED Notes (Addendum)
 Patient transported to CT

## 2024-02-19 LAB — CBG MONITORING, ED
Glucose-Capillary: 188 mg/dL — ABNORMAL HIGH (ref 70–99)
Glucose-Capillary: 106 mg/dL — ABNORMAL HIGH (ref 70–99)

## 2024-02-19 MED ORDER — AMPICILLIN 500 MG PO CAPS: 500.0000 mg | ORAL_CAPSULE | Freq: Three times a day (TID) | ORAL | 0 refills | Status: DC

## 2024-02-19 MED ORDER — LORAZEPAM 0.5 MG PO TABS: ORAL_TABLET | ORAL | 0 refills | Status: AC

## 2024-02-19 NOTE — TOC Transition Note (Addendum)
 Transition of Care [facility]) - Discharge Note   Patient Details  Name: Misty Edwards MRN: [id number] Date of Birth: [birth year]  [facility] CM/SW Contact:  Sharlyne Stabs, RN Phone Number: 02/19/2024, 2:57 PM   Clinical Narrative:   HTA approved for 7 days, Auth ID [id number] for [facility]. Debbie provided room number A27-1 CM attempt to call family listed on the chart. Left a message with Grand daughter. Team updated to call report and schedule transport. MD will discharge. CM faxing AVS to [facility].   Son called back, updated with discharge plan.  Final next level of care: Skilled Nursing Facility     Patient Goals and CMS Choice Patient states their goals for this hospitalization and ongoing recovery are:: agreeable to SNF CMS Medicare.gov Compare Post Acute Care list provided to:: Patient Represenative (must comment) Choice offered to / list presented to : Adult Children      Discharge Placement                Patient to be transferred to facility by: EMS Name of family member notified: Left message with family Patient and family notified of of transfer: 02/19/24  Discharge Plan and Services Additional resources added to the After Visit Summary for       Social Drivers of Health (SDOH) Interventions SDOH Screenings   Food Insecurity: No Food Insecurity (02/08/2024)  Housing: Unknown (02/08/2024)  Transportation Needs: No Transportation Needs (02/08/2024)  Utilities: Not At Risk (02/08/2024)  Depression (PHQ2-9): Low Risk  (02/15/2024)  Recent Concern: Depression (PHQ2-9) - Medium Risk (01/26/2024)  Social Connections: Moderately Isolated (01/31/2024)  Tobacco Use: Low Risk  (02/17/2024)     Readmission Risk Interventions    02/05/2024   10:00 AM 04/16/2022   11:39 AM 03/05/2022    1:14 PM  Readmission Risk Prevention Plan  Transportation Screening Complete Complete Complete  PCP or Specialist Appt within 5-7 Days   Complete  PCP or  Specialist Appt within 3-5 Days Complete    Home Care Screening   Complete  Medication Review (RN CM)   Complete  HRI or Home Care Consult Complete    Social Work Consult for Recovery Care Planning/Counseling Complete    Palliative Care Screening Not Applicable    Medication Review Oceanographer) Complete Complete   PCP or Specialist appointment within 3-5 days of discharge  Not Complete   HRI or Home Care Consult  Complete   SW Recovery Care/Counseling Consult  Complete   Palliative Care Screening  Not Applicable   Skilled Nursing Facility  Not Applicable

## 2024-02-19 NOTE — ED Provider Notes (Signed)
 Emergency Medicine Observation Re-evaluation Note  Misty Edwards is a 89 y.o. female, seen on rounds today.  Pt initially presented to the ED for complaints of Hallucinations and Urinary Frequency Currently, the patient is awake, talkative. Complaining of dysuria. No acute events overnight.   Physical Exam  BP (!) 159/97   Pulse 87   Temp 98.1 F (36.7 C) (Oral)   Resp 16   Ht 5' 3 (1.6 m)   Wt 70.6 kg   SpO2 96%   BMI 27.56 kg/m  Physical Exam General: NAD Lungs: No respiratory distress Psych: calm, confused, at baseline   ED Course / MDM  EKG:   I have reviewed the labs performed to date as well as medications administered while in observation.  Recent changes in the last 24 hours include patient being treated for UTI.   Plan  Current plan is for [facility], waiting for insurance authorization for SNF     Rayleigh Gillyard L, DO 02/19/24 9155

## 2024-02-19 NOTE — TOC Progression Note (Signed)
 Transition of Care [facility]) - Progression Note    Patient Details  Name: DUSTYN ARMBRISTER MRN: [id number] Date of Birth: August 15, 1926  Transition of [facility] CM/SW Contact  Sharlyne Stabs, RN Phone Number: 02/19/2024, 1:23 PM  Clinical Narrative:   Peer to Peer completed with ED physicians and Dr Janit. Dr Artist also spoke with grandson and Stabs CM, [place] PT note completed, CM updated Tammy at [facility] for them to review. AUTH still pending.     Social Drivers of Health (SDOH) Interventions SDOH Screenings   Food Insecurity: No Food Insecurity (02/08/2024)  Housing: Unknown (02/08/2024)  Transportation Needs: No Transportation Needs (02/08/2024)  Utilities: Not At Risk (02/08/2024)  Depression (PHQ2-9): Low Risk  (02/15/2024)  Recent Concern: Depression (PHQ2-9) - Medium Risk (01/26/2024)  Social Connections: Moderately Isolated (01/31/2024)  Tobacco Use: Low Risk  (02/17/2024)    Readmission Risk Interventions    02/05/2024   10:00 AM 04/16/2022   11:39 AM 03/05/2022    1:14 PM  Readmission Risk Prevention Plan  Transportation Screening Complete Complete Complete  PCP or Specialist Appt within 5-7 Days   Complete  PCP or Specialist Appt within 3-5 Days Complete    Home Care Screening   Complete  Medication Review (RN CM)   Complete  HRI or Home Care Consult Complete    Social Work Consult for Recovery Care Planning/Counseling Complete    Palliative Care Screening Not Applicable    Medication Review Oceanographer) Complete Complete   PCP or Specialist appointment within 3-5 days of discharge  Not Complete   HRI or Home Care Consult  Complete   SW Recovery Care/Counseling Consult  Complete   Palliative Care Screening  Not Applicable   Skilled Nursing Facility  Not Applicable

## 2024-02-19 NOTE — Progress Notes (Signed)
 Physical Therapy Treatment Patient Details Name: Misty Edwards MRN: [id number] DOB: 1927-02-18 Today's Date: 02/19/2024   History of Present Illness Misty Edwards is a 89 y.o. female.        Patient presents with concerns over urinary tract infection.  Patient reports that she is experiencing urinary frequency and dysuria, consistent with urinary tract infection she has had in the past.  Family were concerned that she was hallucinating.  Patient reports that she was not hallucinating, did find herself in a situation where she was having trouble membrane things.  At time of evaluation she reports that she has no complaints other than she cannot sleep in this bed.    PT Comments  Patient agreeable for therapy. Patient demonstrates slow labored movement for sitting up at bedside, once seated able to maintain sitting balance, required increased time for completing sit to stands due to BLE weakness, able to take a few steps forward/backwards at bedside before requesting to sit mostly due to c/o fatigue, generalized weakness. Patient tolerated sitting up in chair after therapy - nurse notified. Patient will benefit from continued skilled physical therapy in hospital and recommended venue below to increase strength, balance, endurance for safe ADLs and gait.       If plan is discharge home, recommend the following: A lot of help with walking and/or transfers;A lot of help with bathing/dressing/bathroom;Assistance with cooking/housework;Direct supervision/assist for medications management;Direct supervision/assist for financial management;Assist for transportation;Supervision due to cognitive status;Help with stairs or ramp for entrance   Can travel by private vehicle     No  Equipment Recommendations  None recommended by PT    Recommendations for Other Services       Precautions / Restrictions Precautions Precautions: Fall Recall of Precautions/Restrictions: Impaired Restrictions Weight  Bearing Restrictions Per Provider Order: No     Mobility  Bed Mobility Overal bed mobility: Needs Assistance Bed Mobility: Supine to Sit, Sit to Supine     Supine to sit: Mod assist     General bed mobility comments: increased time, labored movement    Transfers Overall transfer level: Needs assistance Equipment used: Rolling walker (2 wheels) Transfers: Sit to/from Stand, Bed to chair/wheelchair/BSC Sit to Stand: Min assist, Mod assist   Step pivot transfers: Mod assist       General transfer comment: unsteady labored movement    Ambulation/Gait Ambulation/Gait assistance: Mod assist Gait Distance (Feet): 15 Feet Assistive device: Rolling walker (2 wheels) Gait Pattern/deviations: Decreased step length - left, Decreased step length - right, Decreased stride length, Trunk flexed, Knees buckling Gait velocity: decreased     General Gait Details: slightly increased endurance/distance for taking steps forward/backwards at bedside, but limited mostly due to fatigue   Stairs             Wheelchair Mobility     Tilt Bed    Modified Rankin (Stroke Patients Only)       Balance Overall balance assessment: Needs assistance Sitting-balance support: Feet supported, No upper extremity supported Sitting balance-Leahy Scale: Fair Sitting balance - Comments: fair/good seated at EOB   Standing balance support: Reliant on assistive device for balance, During functional activity, Bilateral upper extremity supported Standing balance-Leahy Scale: Poor Standing balance comment: fair/poor using RW                            Communication Communication Communication: No apparent difficulties  Cognition Arousal: Alert Behavior During Therapy: Anxious, Lability   PT -  Cognitive impairments: No family/caregiver present to determine baseline                       PT - Cognition Comments: Patient very anxious Following commands: Impaired Following  commands impaired: Follows one step commands with increased time    Cueing Cueing Techniques: Verbal cues, Tactile cues  Exercises General Exercises - Lower Extremity Ankle Circles/Pumps: AAROM, Strengthening, Both, 10 reps, Seated Long Arc Quad: AROM, Strengthening, Both, 10 reps, Seated Hip Flexion/Marching: AROM, Strengthening, Both, 10 reps, Seated    General Comments        Pertinent Vitals/Pain Pain Assessment Pain Assessment: No/denies pain    Home Living                          Prior Function            PT Goals (current goals can now be found in the care plan section) Acute Rehab PT Goals Patient Stated Goal: return home with family to assist PT Goal Formulation: With patient Time For Goal Achievement: 03/03/24 Progress towards PT goals: Progressing toward goals    Frequency    Min 2X/week      PT Plan      Co-evaluation              AM-PAC PT 6 Clicks Mobility   Outcome Measure  Help needed turning from your back to your side while in a flat bed without using bedrails?: A Little Help needed moving from lying on your back to sitting on the side of a flat bed without using bedrails?: A Lot Help needed moving to and from a bed to a chair (including a wheelchair)?: A Lot Help needed standing up from a chair using your arms (e.g., wheelchair or bedside chair)?: A Lot Help needed to walk in hospital room?: A Lot Help needed climbing 3-5 steps with a railing? : Total 6 Click Score: 12    End of Session         PT Visit Diagnosis: Other abnormalities of gait and mobility (R26.89);Muscle weakness (generalized) (M62.81);Difficulty in walking, not elsewhere classified (R26.2)     Time: 8951-8886 PT Time Calculation (min) (ACUTE ONLY): 25 min  Charges:    $Therapeutic Exercise: 8-22 mins $Therapeutic Activity: 8-22 mins PT General Charges $$ ACUTE PT VISIT: 1 Visit                     12:34 PM, 02/19/24 Lynwood Music,  MPT Physical Therapist with [place] [phone] office [phone] mobile phone

## 2024-02-21 ENCOUNTER — Telehealth (HOSPITAL_BASED_OUTPATIENT_CLINIC_OR_DEPARTMENT_OTHER): Payer: Self-pay | Admitting: *Deleted

## 2024-02-21 LAB — URINE CULTURE: Culture: 40000 — AB

## 2024-02-21 NOTE — Telephone Encounter (Signed)
 Post ED Visit - Positive Culture Follow-up: Successful Patient Follow-Up  Culture assessed and recommendations reviewed by:  [x]  Dorn Buttner, Pharm.D. []  Venetia Gully, [address].D., BCPS AQ-ID []  Garrel Crews, Pharm.D., BCPS []  Almarie Lunger, [address].D., BCPS []  [place], [address].D., BCPS, AAHIVP []  Rosaline Bihari, Pharm.D., BCPS, AAHIVP []  Vernell Meier, PharmD, BCPS []  Latanya Hint, PharmD, BCPS []  Donald Medley, PharmD, BCPS []  Rocky Bold, PharmD  Positive urine culture  []  Patient discharged without antimicrobial prescription and treatment is now indicated [x]  Organism is resistant to prescribed ED discharge antimicrobial []  Patient with positive blood cultures  Changes discussed with ED provider: Ozell Marine New antibiotic prescription Linezolid 600mg  twice daily x 5 days(qty 10) Stop Ampicillin  Called [facility] in [place] and faxed culture report  Contacted facility, date 02/21/24, time 1327   Albino Alan Novak 02/21/2024, 1:22 PM

## 2024-02-21 NOTE — Progress Notes (Signed)
 ED Antimicrobial Stewardship Positive Culture Follow Up   Misty Edwards is an 89 y.o. female who presented to [facility] on @ADMITDT @ with a chief complaint of  Chief Complaint  Patient presents with   Hallucinations   Urinary Frequency    Recent Results (from the past 720 hours)  Urine Culture     Status: Abnormal   Collection Time: 01/26/24  3:54 PM   Specimen: Urine   UR  Result Value Ref Range Status   Urine Culture, Routine Final report (A)  Final   Organism ID, Bacteria Escherichia coli (A)  Final    Comment: Cefazolin  with an MIC <=16 predicts susceptibility to the oral agents cefaclor, cefdinir , cefpodoxime, cefprozil, cefuroxime , cephalexin , and loracarbef when used for therapy of uncomplicated urinary tract infections due to E. coli, Klebsiella pneumoniae, and Proteus mirabilis. Greater than 100,000 colony forming units per mL    ORGANISM ID, BACTERIA Comment  Final    Comment: Mixed urogenital flora 25,000-50,000 colony forming units per mL    Antimicrobial Susceptibility Comment  Final    Comment:       ** S = Susceptible; I = Intermediate; R = Resistant **                    P = Positive; N = Negative             MICS are expressed in micrograms per mL    Antibiotic                 RSLT#1    RSLT#2    RSLT#3    RSLT#4 Amoxicillin/Clavulanic Acid    S Ampicillin                      S Cefazolin                       S Cefepime                       S Cefoxitin                      S Cefpodoxime                    S Ceftriaxone                     S Ciprofloxacin                   S Ertapenem                      S Gentamicin                     S Levofloxacin                   S Meropenem                      S Nitrofurantoin                 S Piperacillin/Tazobactam        S Tetracycline                   S Tobramycin                     S Trimethoprim/Sulfa  S   Microscopic Examination     Status: Abnormal   Collection Time: 01/26/24  3:54  PM   Urine  Result Value Ref Range Status   WBC, UA 11-30 (A) 0 - 5 /hpf Final   RBC, Urine 0-2 0 - 2 /hpf Final   Epithelial Cells (non renal) 0-10 0 - 10 /hpf Final   Renal Epithel, UA None seen None seen /hpf Final   Casts Present (A) None seen /lpf Final   Cast Type Hyaline casts N/A Final   Bacteria, UA Many (A) None seen/Few Final   Yeast, UA None seen None seen Final  Urine Culture     Status: None   Collection Time: 02/15/24  3:33 PM   Specimen: Urine  Result Value Ref Range Status   MICRO NUMBER: [id number]  Final   SPECIMEN QUALITY: Adequate  Final   Sample Source URINE, CLEAN CATCH  Final   STATUS: FINAL  Final   Result:   Final    Mixed genital flora isolated. These superficial bacteria are not indicative of a urinary tract infection. No further organism identification is warranted on this specimen. If clinically indicated, recollect clean-catch, mid-stream urine and transfer  immediately to Urine Culture Transport Tube.   Urine Culture     Status: Abnormal   Collection Time: 02/17/24 11:00 PM   Specimen: Urine, Clean Catch  Result Value Ref Range Status   Specimen Description URINE, CLEAN CATCH  Final   Special Requests NONE  Final   Culture 40,000 COLONIES/mL ENTEROCOCCUS FAECIUM (A)  Final   Report Status 02/20/2024 FINAL  Final   Organism ID, Bacteria ENTEROCOCCUS FAECIUM (A)  Final      Susceptibility   Enterococcus faecium - MIC*    AMPICILLIN  >=32 RESISTANT Resistant     NITROFURANTOIN 32 SENSITIVE Sensitive     VANCOMYCIN  <=0.5 SENSITIVE Sensitive     LINEZOLID Value in next row Sensitive      SENSITIVEMIC = 2Performed at [facility], [address] [place], [place] [address]    * 40,000 COLONIES/mL ENTEROCOCCUS FAECIUM    [x]  Treated with ampicillin , organism resistant to prescribed antimicrobial  I called micro lab to confirm susceptibility to linezolid given sensitivities above. Aware that patient is on fluoxetine , available literature would  suggest interaction risk is low (PMID: [id number]).  STOP Ampicillin  START Linezolid 600mg  BID for 5 days (Qty 10; Refills 0)  ED Provider: Pamella Dorn Buttner, PharmD, BCPS 02/21/2024 10:28 AM ED Clinical Pharmacist -  [phone]

## 2024-03-01 ENCOUNTER — Telehealth: Payer: Self-pay | Admitting: *Deleted

## 2024-03-03 ENCOUNTER — Ambulatory Visit

## 2024-03-14 ENCOUNTER — Telehealth: Payer: Self-pay | Admitting: Family Medicine

## 2024-03-14 NOTE — Telephone Encounter (Signed)
 Copied from CRM #[id number]. Topic: Clinical - Home Health Verbal Orders >> Mar 11, 2024  4:48 PM Winona SAUNDERS wrote: Tinnie from [facility] calling to confirm Dr. Duanne will follow the pt through care and assist with any additional orders that m,ay be needed in the future.

## 2024-03-15 ENCOUNTER — Other Ambulatory Visit

## 2024-03-15 ENCOUNTER — Telehealth: Payer: Self-pay

## 2024-03-15 NOTE — Telephone Encounter (Signed)
 Pt c/o issues with painful urination. Pt's grandson, Ozell Garrison, who is on DPR, states [facility] RN checked pt's vagina and pt has what looks like an abrasion on the side of her vaginal wall. Ozell asks if there is a cream that can be sent in to treat until the patient's appointment on Friday? Thank you.

## 2024-03-17 LAB — URINALYSIS, ROUTINE W REFLEX MICROSCOPIC
Bilirubin Urine: NEGATIVE
Glucose, UA: NEGATIVE
Hgb urine dipstick: NEGATIVE
Hyaline Cast: NONE SEEN /LPF
Ketones, ur: NEGATIVE
Nitrite: POSITIVE — AB
Protein, ur: NEGATIVE
RBC / HPF: NONE SEEN /HPF (ref 0–2)
Specific Gravity, Urine: 1.008 (ref 1.001–1.035)
pH: 6 (ref 5.0–8.0)

## 2024-03-17 LAB — MICROSCOPIC MESSAGE

## 2024-03-18 ENCOUNTER — Other Ambulatory Visit: Payer: Self-pay | Admitting: Family Medicine

## 2024-03-18 ENCOUNTER — Ambulatory Visit: Payer: Self-pay | Admitting: Family Medicine

## 2024-03-18 ENCOUNTER — Inpatient Hospital Stay: Admitting: Family Medicine

## 2024-03-18 MED ORDER — SULFAMETHOXAZOLE-TRIMETHOPRIM 800-160 MG PO TABS: 1.0000 | ORAL_TABLET | Freq: Two times a day (BID) | ORAL | 0 refills | Status: DC

## 2024-03-19 LAB — URINE CULTURE
MICRO NUMBER:: 17415156
SPECIMEN QUALITY:: ADEQUATE

## 2024-03-21 ENCOUNTER — Other Ambulatory Visit: Payer: Self-pay | Admitting: Family Medicine

## 2024-03-21 ENCOUNTER — Ambulatory Visit: Admitting: Family Medicine

## 2024-03-21 ENCOUNTER — Encounter: Payer: Self-pay | Admitting: Family Medicine

## 2024-03-21 VITALS — BP 120/72 | HR 48 | Temp 97.7°F | Ht 63.0 in | Wt 157.0 lb

## 2024-03-21 DIAGNOSIS — N9089 Other specified noninflammatory disorders of vulva and perineum: Secondary | ICD-10-CM | POA: Diagnosis not present

## 2024-03-21 DIAGNOSIS — R0981 Nasal congestion: Secondary | ICD-10-CM | POA: Diagnosis not present

## 2024-03-21 LAB — INFLUENZA A AND B AG, IMMUNOASSAY
INFLUENZA A ANTIGEN: NOT DETECTED
INFLUENZA B ANTIGEN: NOT DETECTED

## 2024-03-21 MED ORDER — NITROFURANTOIN MONOHYD MACRO 100 MG PO CAPS: 100.0000 mg | ORAL_CAPSULE | Freq: Two times a day (BID) | ORAL | 0 refills | Status: AC

## 2024-03-21 NOTE — Progress Notes (Signed)
 "  Subjective:    Patient ID: Misty Edwards, female    DOB: 08/29/1926, 89 y.o.   MRN: [id number]   89 year old F with PMH of IDDM-2, A-fib on Eliquis , diastolic CHF, CKD-3B, CAD, HTN and HLD who presents for follow up of her recent hospitalization for confusion.  Patient was recently discharged approximately 2 weeks ago from a skilled nursing facility.  Her son states that her confusion has improved dramatically.  She did bring a urine sample by last week that suggested a urinary tract infection.  I started the patient on Bactrim .  However urine culture today shows E. coli that is resistant to Bactrim .  She was switched to Macrobid  earlier this morning.  She does report dysuria but her biggest concern is pain inside of vagina.  Patient was examined today with the assistance of my nurse.  On the labia majora, there is bright red granulation tissue and ulcers adjacent to the introitus.  The patient states that these are extremely painful and have been present now for a couple months.  Past Medical History:  Diagnosis Date   Acute biliary pancreatitis 07/2002   thia was in 07/2004:she still has pseudocyst in tail of pancreas measuring 54 x 33 mm    Adrenal adenoma    bilateral   Anxiety    Atrial fibrillation (HCC)    CAD (coronary artery disease)    a. s/p CABG in 08/2014 with LIMA-LAD, SVG-OM and SVG-PDA   CAD (coronary artery disease)    a. s/p CABG in 08/2014 with LIMA-LAD, SVG-OM and SVG-PDA   Chronic pancreatitis (HCC)    Detached retina    Diabetes mellitus (HCC)    Diverticulosis    Heartburn    History of carcinoma in situ of breast 1988   Hyperlipidemia    Hypertension    IBS (irritable bowel syndrome)    Legally blind    Osteoarthritis    Osteopenia    Upper GI bleed September2004   secondary to gastritis   Past Surgical History:  Procedure Laterality Date   CARDIAC CATHETERIZATION N/A 08/24/2014   Procedure: Left Heart Cath and Coronary Angiography;  Surgeon: Alm LELON Clay, MD;  Location: [facility] INVASIVE CV LAB;  Service: Cardiovascular;  Laterality: N/A;   COLONOSCOPY  08/15/05   few tiny diverticula at sigmoid colon/external hemorrhoids but no polyps   COLONOSCOPY  01/08/2012   MFM:Rnonwpr diverticulosis. Next colonoscopy in 12/2016   CORONARY ARTERY BYPASS GRAFT N/A 08/24/2014   Procedure: CORONARY ARTERY BYPASS GRAFTING (CABG) x three, using left internal mammary artery and right leg greater saphenous vein harvested endoscopically;  Surgeon: Maude Fleeta Ochoa, MD;  Location: [facility] OR;  Service: Open Heart Surgery;  Laterality: N/A;   ECTOPIC PREGNANCY SURGERY  1950's   ERCP with sphincterotomy  07/2002   ESOPHAGOGASTRODUODENOSCOPY      Gastritis of body.  Otherwise normal   ESOPHAGOGASTRODUODENOSCOPY  01/08/2012   RMR: Few scattered gastric erosions of uncertain significance-status post biopsy. Minimal chronic inflammation, no H.pylori   LAPAROSCOPIC CHOLECYSTECTOMY     MASTECTOMY Right 1980   PANCREATIC PSEUDOCYST DRAINAGE  [id number]   drained percutaneously    right mastectomy     tacking up of her bladder     TEE WITHOUT CARDIOVERSION N/A 08/24/2014   Procedure: TRANSESOPHAGEAL ECHOCARDIOGRAM (TEE);  Surgeon: Maude Fleeta Ochoa, MD;  Location: [facility] OR;  Service: Open Heart Surgery;  Laterality: N/A;   Current Outpatient Medications on File Prior to Visit  Medication Sig Dispense Refill  insulin  aspart protamine - aspart (NOVOLOG  MIX 70/30 RELION) (70-30) 100 UNIT/ML injection 50 units in am, 8 units in pm (Patient taking differently: Inject 40 Units into the skin 2 (two) times daily with a meal. 40 units in am)     acetaminophen  (TYLENOL ) 325 MG tablet Take 650 mg by mouth every 6 (six) hours as needed for mild pain (pain score 1-3).     alum & mag hydroxide-simeth (MAALOX/MYLANTA) 200-200-20 MG/5ML suspension Take 30 mLs by mouth every 6 (six) hours as needed for indigestion or heartburn.     apixaban  (ELIQUIS ) 2.5 MG TABS tablet TAKE 1 TABLET BY MOUTH TWICE DAILY  180 tablet 1   Blood Glucose Monitoring Suppl (BLOOD GLUCOSE SYSTEM PAK) KIT Please dispense as One Touch Ultra. Use as directed to monitor FSBS 4x daily. Dx: E11.65 1 kit 1   calcium  carbonate (TUMS - DOSED IN MG ELEMENTAL CALCIUM ) 500 MG chewable tablet Chew 3 tablets by mouth as needed for heartburn or indigestion.     cetirizine  (ZYRTEC ) 10 MG tablet TAKE 1 TABLET BY MOUTH DAILY 90 tablet 11   Continuous Blood Gluc Receiver (FREESTYLE LIBRE 3 READER) DEVI 1 Device by Does not apply route daily. Use to continuously check blood sugar daily. 1 each 1   Continuous Glucose Sensor (FREESTYLE LIBRE 3 SENSOR) MISC USE TO CHECK BLOOD GLUCOSE LEVELS CONTINUOUSLY AND CHANGE EVERY 14 DAYS 6 each 0   CREON  24000-76000 units CPEP TAKE 2 CAPSULES BY MOUTH WITH MEALS AND 1 CAP WITH SNACKS x 2 240 capsule 11   estradiol  (ESTRACE  VAGINAL) 0.1 MG/GM vaginal cream Place 1 Applicatorful vaginally at bedtime. 42.5 g 12   famotidine  (PEPCID ) 40 MG tablet Take 1 tablet (40 mg total) by mouth daily. 90 tablet 1   ferrous sulfate  325 (65 FE) MG tablet Take 325 mg by mouth daily with breakfast.     FLUoxetine  (PROZAC ) 20 MG capsule TAKE 1 CAPSULE BY MOUTH ONCE DAILY 90 capsule 10   furosemide  (LASIX ) 20 MG tablet Take 1 tablet (20 mg total) by mouth See admin instructions. Take 40 mg in the morning and 20 mg at 4 pm.     insulin  regular (HUMULIN  R) 100 units/mL injection Check 3 times a day before meals and administer insulin  based on her sugars Regular insulin   sugar 0 units  < 150 2 units 151-200 4 units 201-250 6 units 251-300 8 units 301-350 10 units 351+ 10 mL 11   Insulin  Syringe-Needle U-100 (BD INSULIN  SYRINGE U/F) 31G X 5/16 0.5 ML MISC USE AS DIRECTED TO ADMINISTER INSULIN  TWICE DAILY 300 each 1   Insulin  Syringes, Disposable, U-100 1 ML MISC Use as directed to administer insulin  daily. 100 each 3   Lancets Misc. MISC Pt has Accu Chek Aviva Plus - Needs just lancets  Checks BS 4-5 times per day. Disp 3  boxes(90 day Supply)/4 refills Dx code. E11.9 450 each 3   LORazepam  (ATIVAN ) 0.5 MG tablet Take 1 at night and as needed for sleep 20 tablet 0   magnesium  oxide (MAG-OX) 400 MG tablet Take 1 tablet (400 mg total) by mouth daily. 90 tablet 1   metoprolol  succinate (TOPROL -XL) 25 MG 24 hr tablet Take 3 tablets (75 mg total) by mouth 2 (two) times daily. 180 tablet 1   Multiple Vitamin (MULTIVITAMIN) capsule Take 1 capsule by mouth every morning.      nystatin  cream (MYCOSTATIN ) Apply 1 Application topically 3 (three) times daily. 30 g 0   OneTouch Delica  Lancets 33G MISC CHECK BLOOD SUGAR 4-5 TIMES PER DAY 300 each 0   ONETOUCH ULTRA test strip USE 1 STRIP TO CHECK GLUCOSE FIVE TIMES DAILY 300 each 10   pantoprazole  (PROTONIX ) 40 MG tablet Take 1 tablet (40 mg total) by mouth daily. 90 tablet 3   potassium chloride  (KLOR-CON ) 10 MEQ tablet TAKE 1 TABLET BY MOUTH DAILY 90 tablet 0   Propylene Glycol-Glycerin (SOOTHE OP) Apply 2 drops to eye 2 (two) times daily.     QUEtiapine  (SEROQUEL ) 25 MG tablet Take 1 tablet (25 mg total) by mouth at bedtime as needed (sundowning). 30 tablet 1   rosuvastatin  (CRESTOR ) 5 MG tablet TAKE 1 TABLET BY MOUTH EACH EVENING 90 tablet 11   triamcinolone  cream (KENALOG ) 0.1 % Apply topically 2 (two) times daily. 60 g 10   No current facility-administered medications on file prior to visit.   Allergies  Allergen Reactions   Morphine  And Codeine Other (See Comments)    Hallucination   Calcium -Containing Compounds Nausea Only   Raloxifene  Itching    Evista - Face and eyes burning   Vitamin D Analogs Nausea Only   Social History   Socioeconomic History   Marital status: Widowed    Spouse name: Not on file   Number of children: 5   Years of education: Not on file   Highest education level: Not on file  Occupational History   Occupation: homemaker  Tobacco Use   Smoking status: Never   Smokeless tobacco: Never  Vaping Use   Vaping status: Never Used   Substance and Sexual Activity   Alcohol use: No    Alcohol/week: 0.0 standard drinks of alcohol   Drug use: No   Sexual activity: Not Currently    Birth control/protection: Post-menopausal  Other Topics Concern   Not on file  Social History Narrative   Lives in [place].   Social Drivers of Health   Tobacco Use: Low Risk (03/21/2024)   Patient History    Smoking Tobacco Use: Never    Smokeless Tobacco Use: Never    Passive Exposure: Not on file  Financial Resource Strain: Not on file  Food Insecurity: No Food Insecurity (02/08/2024)   Epic    Worried About Programme Researcher, Broadcasting/film/video in the Last Year: Never true    Ran Out of Food in the Last Year: Never true  Transportation Needs: No Transportation Needs (02/08/2024)   Epic    Lack of Transportation (Medical): No    Lack of Transportation (Non-Medical): No  Physical Activity: Not on file  Stress: Not on file  Social Connections: Moderately Isolated (01/31/2024)   Social Connection and Isolation Panel    Frequency of Communication with Friends and Family: More than three times a week    Frequency of Social Gatherings with Friends and Family: More than three times a week    Attends Religious Services: Patient declined    Database Administrator or Organizations: Yes    Attends Banker Meetings: 1 to 4 times per year    Marital Status: Widowed  Intimate Partner Violence: Not At Risk (02/08/2024)   Epic    Fear of Current or Ex-Partner: No    Emotionally Abused: No    Physically Abused: No    Sexually Abused: No  Depression (PHQ2-9): Low Risk (02/15/2024)   Depression (PHQ2-9)    PHQ-2 Score: 0  Recent Concern: Depression (PHQ2-9) - Medium Risk (01/26/2024)   Depression (PHQ2-9)    PHQ-2 Score: 10  Alcohol Screen: Not on file  Housing: Unknown (02/08/2024)   Epic    Unable to Pay for Housing in the Last Year: No    Number of Times Moved in the Last Year: Not on file    Homeless in the Last Year: No  Utilities:  Not At Risk (02/08/2024)   Epic    Threatened with loss of utilities: No  Health Literacy: Not on file          Review of Systems  All other systems reviewed and are negative.      Objective:       Physical Exam Constitutional:      General: She is not in acute distress.    Appearance: Normal appearance. She is normal weight. She is not ill-appearing or toxic-appearing.  Cardiovascular:     Rate and Rhythm: Normal rate. Rhythm irregular.     Heart sounds: Normal heart sounds. No murmur heard.    No friction rub. No gallop.  Pulmonary:     Effort: Pulmonary effort is normal. No respiratory distress.     Breath sounds: No stridor. No wheezing, rhonchi or rales.  Chest:     Chest wall: No tenderness.  Abdominal:     General: Abdomen is flat. Bowel sounds are normal. There is no distension.     Palpations: Abdomen is soft. There is no mass.     Tenderness: There is no abdominal tenderness.  Musculoskeletal:     Right lower leg: No edema.     Left lower leg: No edema.  Skin:    Findings: No erythema or rash.  Neurological:     General: No focal deficit present.     Mental Status: She is alert and oriented to person, place, and time. Mental status is at baseline.     Cranial Nerves: No cranial nerve deficit.     Sensory: No sensory deficit.     Motor: Weakness present.     Coordination: Coordination normal.   See the description and the history of present illness.   Assessment & Plan:  Nasal congestion - Plan: Influenza A and B Ag, Immunoassay  Vulvar lesion - Plan: Ambulatory referral to Gynecology  Area on the right labia majora seems to be worsening.  I am concerned that this may be vulvar cancer.  I will consult gynecology for a biopsy and evaluation.  In the meantime I have recommended they apply triple antibiotic ointment to the area to prevent any secondary infection.  Patient has a urinary tract infection for which I am treating her with Macrobid  100 mg twice  daily for 7 days.  Blood sugars seem well-controlled.  They are currently giving her 40 units of insulin  in the morning and all of her blood sugars are being maintained between 80 and 190.  The patient does have nasal congestion today.  Given her recent nursing home stay, I checked the patient for influenza.  Her flu test is negative "

## 2024-03-22 DIAGNOSIS — Z794 Long term (current) use of insulin: Secondary | ICD-10-CM

## 2024-03-22 DIAGNOSIS — N1832 Chronic kidney disease, stage 3b: Secondary | ICD-10-CM

## 2024-03-22 DIAGNOSIS — N289 Disorder of kidney and ureter, unspecified: Secondary | ICD-10-CM

## 2024-03-22 MED ORDER — FREESTYLE LIBRE 3 SENSOR MISC: 11 refills | Status: AC

## 2024-03-23 ENCOUNTER — Telehealth: Payer: Self-pay

## 2024-03-23 NOTE — Telephone Encounter (Signed)
 Hi ma'am,  Dr. Duanne entered a GYN referral for the patient on 03/21/24. The family would like for her to go to [facility] in [place], if possible. Thank you!

## 2024-03-24 ENCOUNTER — Encounter: Admitting: Adult Health

## 2024-03-25 ENCOUNTER — Telehealth: Payer: Self-pay

## 2024-03-25 NOTE — Telephone Encounter (Signed)
 Copied from CRM [id number]. Topic: General - Other >> Mar 25, 2024 10:59 AM Tonda B wrote: Reason for CRM: [facility] calling saying that they will keep seeing pt as of  03/28/2024 please call [phone] ask for jenna

## 2024-03-29 ENCOUNTER — Ambulatory Visit: Admitting: Obstetrics & Gynecology

## 2024-03-29 ENCOUNTER — Encounter: Payer: Self-pay | Admitting: Obstetrics & Gynecology

## 2024-03-29 VITALS — BP 135/78 | HR 93 | Ht 63.0 in | Wt 157.2 lb

## 2024-03-29 DIAGNOSIS — L309 Dermatitis, unspecified: Secondary | ICD-10-CM

## 2024-03-29 DIAGNOSIS — N9089 Other specified noninflammatory disorders of vulva and perineum: Secondary | ICD-10-CM | POA: Diagnosis not present

## 2024-03-29 DIAGNOSIS — D239 Other benign neoplasm of skin, unspecified: Secondary | ICD-10-CM

## 2024-03-29 DIAGNOSIS — Z87440 Personal history of urinary (tract) infections: Secondary | ICD-10-CM | POA: Diagnosis not present

## 2024-03-29 MED ORDER — NYSTATIN-TRIAMCINOLONE 100000-0.1 UNIT/GM-% EX OINT: TOPICAL_OINTMENT | CUTANEOUS | 3 refills | Status: AC

## 2024-03-29 NOTE — Progress Notes (Signed)
 "  GYN VISIT Patient name: Misty Edwards MRN [id number]  Date of birth: 04-Dec-1926 Chief Complaint:   Gynecologic Exam  History of Present Illness:   Misty Edwards is a 89 y.o. [id number] PM female being seen today for the following concerns:  -Frequent UTIs- some dysuria currently.  Denies hematuria or urinary frequency Records reviewed already started on vaginal estrogen  Per family member/nurses's aid-there was concern about a potential vulvar lesion that may require further intervention.  Patient reports vulvar discomfort and irritation.  She denies vaginal discharge or bleeding.  Denies pelvic or abdominal pain   No LMP recorded. Patient is postmenopausal.    Review of Systems:   Pertinent items are noted in HPI Currently getting over URI and states she has felt fatigue and not quite herself Pertinent History Reviewed:   Past Surgical History:  Procedure Laterality Date   CARDIAC CATHETERIZATION N/A 08/24/2014   Procedure: Left Heart Cath and Coronary Angiography;  Surgeon: Alm LELON Clay, MD;  Location: [facility] INVASIVE CV LAB;  Service: Cardiovascular;  Laterality: N/A;   COLONOSCOPY  08/15/05   few tiny diverticula at sigmoid colon/external hemorrhoids but no polyps   COLONOSCOPY  01/08/2012   MFM:Rnonwpr diverticulosis. Next colonoscopy in 12/2016   CORONARY ARTERY BYPASS GRAFT N/A 08/24/2014   Procedure: CORONARY ARTERY BYPASS GRAFTING (CABG) x three, using left internal mammary artery and right leg greater saphenous vein harvested endoscopically;  Surgeon: Maude Fleeta Ochoa, MD;  Location: [facility] OR;  Service: Open Heart Surgery;  Laterality: N/A;   ECTOPIC PREGNANCY SURGERY  1950's   ERCP with sphincterotomy  07/2002   ESOPHAGOGASTRODUODENOSCOPY      Gastritis of body.  Otherwise normal   ESOPHAGOGASTRODUODENOSCOPY  01/08/2012   RMR: Few scattered gastric erosions of uncertain significance-status post biopsy. Minimal chronic inflammation, no H.pylori   LAPAROSCOPIC CHOLECYSTECTOMY      MASTECTOMY Right 1980   PANCREATIC PSEUDOCYST DRAINAGE  [id number]   drained percutaneously    right mastectomy     tacking up of her bladder     TEE WITHOUT CARDIOVERSION N/A 08/24/2014   Procedure: TRANSESOPHAGEAL ECHOCARDIOGRAM (TEE);  Surgeon: Maude Fleeta Ochoa, MD;  Location: [facility] OR;  Service: Open Heart Surgery;  Laterality: N/A;    Past Medical History:  Diagnosis Date   Acute biliary pancreatitis 07/2002   thia was in 07/2004:she still has pseudocyst in tail of pancreas measuring 54 x 33 mm    Adrenal adenoma    bilateral   Anxiety    Atrial fibrillation (HCC)    CAD (coronary artery disease)    a. s/p CABG in 08/2014 with LIMA-LAD, SVG-OM and SVG-PDA   CAD (coronary artery disease)    a. s/p CABG in 08/2014 with LIMA-LAD, SVG-OM and SVG-PDA   Chronic pancreatitis (HCC)    Detached retina    Diabetes mellitus (HCC)    Diverticulosis    Heartburn    History of carcinoma in situ of breast 1988   Hyperlipidemia    Hypertension    IBS (irritable bowel syndrome)    Legally blind    Osteoarthritis    Osteopenia    Upper GI bleed September2004   secondary to gastritis   Reviewed problem list, medications and allergies. Physical Assessment:   Vitals:   03/29/24 1330 03/29/24 1353  BP: (!) 145/88 135/78  Pulse: 82 93  Weight: 157 lb 3.2 oz (71.3 kg)   Height: 5' 3 (1.6 m)   Body mass index is 27.85 kg/m.  Physical Examination:   General appearance: alert, well appearing, and in no distress  Psych: mood appropriate, normal affect  Skin: warm & dry   Cardiovascular: normal heart rate noted  Respiratory: normal respiratory effort, no distress  Abdomen: soft, non-tender   Pelvic: Multiple angiokeratoma's noted bilateral labia majora.  Loss of clitoral hood architecture noted.  Diffuse labial erythema with some hypopigmentation.  No discrete lesion/area of concern.  Narrowed introitus noted. Area of irritation also noted bilateral groin Chaperone: Tour Manager,  PGY2    Assessment & Plan:  1) Recurrent UTI -plan to recheck UA and culture - Continue vaginal estradiol  cream twice per week  2) Vulvar dermatitis - No acute areas of concern - Will send in Mycolog -f/u in 3 mos  Meds ordered this encounter  Medications   nystatin -triamcinolone  ointment (MYCOLOG)    Sig: Pea-sized amount to affected area daily as needed    Dispense:  30 g    Refill:  3      Orders Placed This Encounter  Procedures   Urine Culture    Return in about 3 months (around 06/27/2024) for Medication follow up.   Mackey Varricchio, DO Attending Obstetrician & Gynecologist, [facility] for [facility], [facility]    "

## 2024-03-31 ENCOUNTER — Ambulatory Visit: Payer: Self-pay | Admitting: Obstetrics & Gynecology

## 2024-03-31 DIAGNOSIS — N390 Urinary tract infection, site not specified: Secondary | ICD-10-CM

## 2024-03-31 MED ORDER — SULFAMETHOXAZOLE-TRIMETHOPRIM 800-160 MG PO TABS: 1.0000 | ORAL_TABLET | Freq: Two times a day (BID) | ORAL | 0 refills | Status: AC

## 2024-04-02 LAB — URINE CULTURE

## 2024-04-12 ENCOUNTER — Ambulatory Visit: Admitting: Cardiology

## 2024-04-13 ENCOUNTER — Other Ambulatory Visit: Payer: Self-pay

## 2024-04-13 DIAGNOSIS — R0989 Other specified symptoms and signs involving the circulatory and respiratory systems: Secondary | ICD-10-CM

## 2024-04-13 DIAGNOSIS — I5032 Chronic diastolic (congestive) heart failure: Secondary | ICD-10-CM

## 2024-04-13 MED ORDER — FUROSEMIDE 20 MG PO TABS: 20.0000 mg | ORAL_TABLET | ORAL | 1 refills | Status: AC

## 2024-04-15 ENCOUNTER — Ambulatory Visit: Admitting: Family Medicine

## 2024-04-15 VITALS — BP 120/70 | HR 86 | Temp 97.6°F | Ht 63.0 in | Wt 151.2 lb

## 2024-04-15 DIAGNOSIS — J40 Bronchitis, not specified as acute or chronic: Secondary | ICD-10-CM | POA: Diagnosis not present

## 2024-04-15 DIAGNOSIS — R300 Dysuria: Secondary | ICD-10-CM | POA: Diagnosis not present

## 2024-04-15 MED ORDER — AMOXICILLIN-POT CLAVULANATE 875-125 MG PO TABS: 1.0000 | ORAL_TABLET | Freq: Two times a day (BID) | ORAL | 0 refills | Status: AC

## 2024-04-16 LAB — URINALYSIS, ROUTINE W REFLEX MICROSCOPIC
Bilirubin Urine: NEGATIVE
Glucose, UA: NEGATIVE
Hgb urine dipstick: NEGATIVE
Ketones, ur: NEGATIVE
Nitrite: NEGATIVE
RBC / HPF: NONE SEEN /HPF (ref 0–2)
Specific Gravity, Urine: 1.013 (ref 1.001–1.035)
pH: 7 (ref 5.0–8.0)

## 2024-04-16 LAB — MICROSCOPIC MESSAGE

## 2024-04-18 ENCOUNTER — Ambulatory Visit: Payer: Self-pay | Admitting: Family Medicine

## 2024-04-20 ENCOUNTER — Other Ambulatory Visit: Payer: Self-pay | Admitting: Family Medicine

## 2024-04-20 DIAGNOSIS — R12 Heartburn: Secondary | ICD-10-CM

## 2024-04-20 DIAGNOSIS — R0989 Other specified symptoms and signs involving the circulatory and respiratory systems: Secondary | ICD-10-CM

## 2024-04-20 DIAGNOSIS — I5032 Chronic diastolic (congestive) heart failure: Secondary | ICD-10-CM

## 2024-04-20 DIAGNOSIS — I10 Essential (primary) hypertension: Secondary | ICD-10-CM

## 2024-04-20 DIAGNOSIS — N289 Disorder of kidney and ureter, unspecified: Secondary | ICD-10-CM

## 2024-04-20 MED ORDER — PREDNISONE 20 MG PO TABS: ORAL_TABLET | ORAL | 0 refills | Status: AC

## 2024-04-20 NOTE — Telephone Encounter (Signed)
 Copied from CRM [id number]. Topic: Clinical - Medication Question >> Apr 20, 2024 11:10 AM Antwanette L wrote: Reason for CRM: [place] from [facility] is calling to request refills for magnesium  oxide (MAG-OX) 400 MG tablet, furosemide  (LASIX ) 20 MG tablet, and pantoprazole  (PROTONIX ) 40 MG tablet. She is requesting a 90 day supply for each medication.Please fax the request to  [phone]. Malyasia can be reached at [phone]

## 2024-04-22 ENCOUNTER — Other Ambulatory Visit

## 2024-04-22 DIAGNOSIS — I776 Arteritis, unspecified: Secondary | ICD-10-CM

## 2024-04-22 DIAGNOSIS — I10 Essential (primary) hypertension: Secondary | ICD-10-CM

## 2024-04-22 DIAGNOSIS — N289 Disorder of kidney and ureter, unspecified: Secondary | ICD-10-CM

## 2024-04-22 DIAGNOSIS — I5032 Chronic diastolic (congestive) heart failure: Secondary | ICD-10-CM

## 2024-04-22 LAB — CBC WITH DIFFERENTIAL/PLATELET
Absolute Lymphocytes: 898 {cells}/uL (ref 850–3900)
Absolute Monocytes: 161 {cells}/uL — ABNORMAL LOW (ref 200–950)
Basophils Absolute: 7 {cells}/uL (ref 0–200)
Basophils Relative: 0.1 %
Eosinophils Absolute: 0 {cells}/uL — ABNORMAL LOW (ref 15–500)
Eosinophils Relative: 0 %
HCT: 41.2 % (ref 35.9–46.0)
Hemoglobin: 12.7 g/dL (ref 11.7–15.5)
MCH: 28.4 pg (ref 27.0–33.0)
MCHC: 30.8 g/dL — ABNORMAL LOW (ref 31.6–35.4)
MCV: 92.2 fL (ref 81.4–101.7)
MPV: 11.8 fL (ref 7.5–12.5)
Monocytes Relative: 2.2 %
Neutro Abs: 6234 {cells}/uL (ref 1500–7800)
Neutrophils Relative %: 85.4 %
Platelets: 151 10*3/uL (ref 140–400)
RBC: 4.47 Million/uL (ref 3.80–5.10)
RDW: 14.5 % (ref 11.0–15.0)
Total Lymphocyte: 12.3 %
WBC: 7.3 10*3/uL (ref 3.8–10.8)

## 2024-04-22 MED ORDER — PANTOPRAZOLE SODIUM 40 MG PO TBEC: 40.0000 mg | DELAYED_RELEASE_TABLET | Freq: Every day | ORAL | 0 refills | Status: AC

## 2024-04-22 NOTE — Telephone Encounter (Signed)
 Requested Prescriptions  Pending Prescriptions Disp Refills   pantoprazole  (PROTONIX ) 40 MG tablet 90 tablet 0    Sig: Take 1 tablet (40 mg total) by mouth daily.     Gastroenterology: Proton Pump Inhibitors Passed - 04/22/2024 10:16 AM      Passed - Valid encounter within last 12 months    Recent Outpatient Visits           1 week ago Dysuria   [place] [facility] Pickard, Butler DASEN, MD   1 month ago Nasal congestion   [place] [facility] Duanne, Butler DASEN, MD   2 months ago Confusion   [place] [facility] Duanne Butler DASEN, MD   4 months ago Type 2 diabetes mellitus with other specified complication, with long-term current use of insulin  [facility])   [place] [facility] Duanne Butler DASEN, MD   12 months ago Upper respiratory infection, viral   [place] [facility] Aletha Bene, MD       Future Appointments             In 2 months Branch, Dorn FALCON, MD [facility] at [facility] [place] [facility]            Refused Prescriptions Disp Refills   magnesium  oxide (MAG-OX) 400 MG tablet 90 tablet     Sig: Take 1 tablet (400 mg total) by mouth daily.     Endocrinology:  Minerals - Magnesium  Supplementation Failed - 04/22/2024 10:16 AM      Failed - Cr in normal range and within 360 days    Creat  Date Value Ref Range Status  02/15/2024 1.74 (H) 0.60 - 0.95 mg/dL Final   Creatinine, Ser  Date Value Ref Range Status  02/17/2024 1.42 (H) 0.44 - 1.00 mg/dL Final         Passed - Mg Level in normal range and within 360 days    Magnesium   Date Value Ref Range Status  02/03/2024 2.4 1.7 - 2.4 mg/dL Final    Comment:    Performed at [facility], [address] [place], [place] [address]         Passed - Valid encounter within last 12 months    Recent Outpatient Visits           1 week ago Dysuria   [place] [facility] Pickard, Butler DASEN, MD   1  month ago Nasal congestion   [place] [facility] Duanne, Butler DASEN, MD   2 months ago Confusion   [place] [facility] Duanne Butler DASEN, MD   4 months ago Type 2 diabetes mellitus with other specified complication, with long-term current use of insulin  [facility])   [place] [facility] Medicine Pickard, Butler DASEN, MD   12 months ago Upper respiratory infection, viral    [facility] Aletha Bene, MD       Future Appointments             In 2 months Branch, Dorn FALCON, MD [facility] at [facility], [place] [facility]             furosemide  (LASIX ) 20 MG tablet 30 tablet     Sig: Take 1 tablet (20 mg total) by mouth See admin instructions.     Cardiovascular:  Diuretics - Loop Failed - 04/22/2024 10:16 AM  Failed - Cr in normal range and within 180 days    Creat  Date Value Ref Range Status  02/15/2024 1.74 (H) 0.60 - 0.95 mg/dL Final   Creatinine, Ser  Date Value Ref Range Status  02/17/2024 1.42 (H) 0.44 - 1.00 mg/dL Final         Failed - Cl in normal range and within 180 days    Chloride  Date Value Ref Range Status  02/17/2024 95 (L) 98 - 111 mmol/L Final         Passed - K in normal range and within 180 days    Potassium  Date Value Ref Range Status  02/17/2024 4.6 3.5 - 5.1 mmol/L Final  10/15/2011 4.5 mmol/L Final         Passed - Ca in normal range and within 180 days    Calcium   Date Value Ref Range Status  02/17/2024 9.2 8.9 - 10.3 mg/dL Final  92/68/7986 9.7 mg/dL Final   Calcium , Ion  Date Value Ref Range Status  09/23/2019 1.16 1.15 - 1.40 mmol/L Final         Passed - Na in normal range and within 180 days    Sodium  Date Value Ref Range Status  02/17/2024 135 135 - 145 mmol/L Final  05/14/2022 137 134 - 144 mmol/L Final         Passed - Mg Level in normal range and within 180 days    Magnesium   Date Value Ref Range Status  02/03/2024 2.4 1.7 - 2.4  mg/dL Final    Comment:    Performed at [facility], [address], [place], [place] [address]         Passed - Last BP in normal range    BP Readings from Last 1 Encounters:  04/15/24 120/70         Passed - Valid encounter within last 6 months    Recent Outpatient Visits           1 week ago Dysuria   [place] [facility] Duanne Butler DASEN, MD   1 month ago Nasal congestion   [place] [facility] Duanne Butler DASEN, MD   2 months ago Confusion   [place] [facility] Duanne Butler DASEN, MD   4 months ago Type 2 diabetes mellitus with other specified complication, with long-term current use of insulin  [facility])   [place] [facility] Pickard, Butler DASEN, MD   12 months ago Upper respiratory infection, viral   [place] [address] Family Medicine Aletha Bene, MD       Future Appointments             In 2 months Branch, Dorn FALCON, MD [facility] at [facility]

## 2024-06-02 ENCOUNTER — Ambulatory Visit: Admitting: Family Medicine

## 2024-07-06 ENCOUNTER — Ambulatory Visit: Admitting: Cardiology
